# Patient Record
Sex: Female | Born: 1955
Health system: Southern US, Community
[De-identification: ages and names within clinical notes are randomized; demographics above are authoritative.]

## PROBLEM LIST (undated history)

## (undated) DIAGNOSIS — R0600 Dyspnea, unspecified: Secondary | ICD-10-CM

## (undated) DIAGNOSIS — E119 Type 2 diabetes mellitus without complications: Secondary | ICD-10-CM

## (undated) DIAGNOSIS — F419 Anxiety disorder, unspecified: Secondary | ICD-10-CM

## (undated) DIAGNOSIS — I509 Heart failure, unspecified: Secondary | ICD-10-CM

## (undated) DIAGNOSIS — Z87442 Personal history of urinary calculi: Secondary | ICD-10-CM

## (undated) DIAGNOSIS — G709 Myoneural disorder, unspecified: Secondary | ICD-10-CM

## (undated) DIAGNOSIS — Z9981 Dependence on supplemental oxygen: Secondary | ICD-10-CM

## (undated) DIAGNOSIS — F329 Major depressive disorder, single episode, unspecified: Secondary | ICD-10-CM

## (undated) DIAGNOSIS — F32A Depression, unspecified: Secondary | ICD-10-CM

## (undated) DIAGNOSIS — E785 Hyperlipidemia, unspecified: Secondary | ICD-10-CM

## (undated) DIAGNOSIS — I10 Essential (primary) hypertension: Secondary | ICD-10-CM

## (undated) DIAGNOSIS — J398 Other specified diseases of upper respiratory tract: Secondary | ICD-10-CM

## (undated) DIAGNOSIS — R87629 Unspecified abnormal cytological findings in specimens from vagina: Secondary | ICD-10-CM

## (undated) DIAGNOSIS — J189 Pneumonia, unspecified organism: Secondary | ICD-10-CM

## (undated) DIAGNOSIS — F319 Bipolar disorder, unspecified: Secondary | ICD-10-CM

## (undated) DIAGNOSIS — K219 Gastro-esophageal reflux disease without esophagitis: Secondary | ICD-10-CM

## (undated) DIAGNOSIS — J449 Chronic obstructive pulmonary disease, unspecified: Secondary | ICD-10-CM

## (undated) DIAGNOSIS — M1990 Unspecified osteoarthritis, unspecified site: Secondary | ICD-10-CM

## (undated) DIAGNOSIS — Z8719 Personal history of other diseases of the digestive system: Secondary | ICD-10-CM

## (undated) DIAGNOSIS — D649 Anemia, unspecified: Secondary | ICD-10-CM

## (undated) DIAGNOSIS — R519 Headache, unspecified: Secondary | ICD-10-CM

## (undated) DIAGNOSIS — J439 Emphysema, unspecified: Secondary | ICD-10-CM

## (undated) HISTORY — DX: Hyperlipidemia, unspecified: E78.5

## (undated) HISTORY — DX: Unspecified abnormal cytological findings in specimens from vagina: R87.629

## (undated) HISTORY — PX: KIDNEY STONE SURGERY: SHX686

## (undated) HISTORY — DX: Emphysema, unspecified: J43.9

## (undated) HISTORY — PX: HERNIA REPAIR: SHX51

## (undated) HISTORY — DX: Pneumonia, unspecified organism: J18.9

## (undated) HISTORY — PX: CHOLECYSTECTOMY: SHX55

---

## 2000-10-11 ENCOUNTER — Ambulatory Visit (HOSPITAL_COMMUNITY): Admission: RE | Admit: 2000-10-11 | Discharge: 2000-10-11 | Payer: Self-pay | Admitting: Orthopaedic Surgery

## 2000-10-11 ENCOUNTER — Encounter: Payer: Self-pay | Admitting: Orthopaedic Surgery

## 2000-11-16 ENCOUNTER — Emergency Department (HOSPITAL_COMMUNITY): Admission: EM | Admit: 2000-11-16 | Discharge: 2000-11-17 | Payer: Self-pay | Admitting: *Deleted

## 2002-02-24 ENCOUNTER — Inpatient Hospital Stay (HOSPITAL_COMMUNITY): Admission: EM | Admit: 2002-02-24 | Discharge: 2002-02-27 | Payer: Self-pay | Admitting: Psychiatry

## 2002-05-24 ENCOUNTER — Encounter: Payer: Self-pay | Admitting: Family Medicine

## 2002-05-24 ENCOUNTER — Ambulatory Visit (HOSPITAL_COMMUNITY): Admission: RE | Admit: 2002-05-24 | Discharge: 2002-05-24 | Payer: Self-pay | Admitting: Family Medicine

## 2002-05-29 ENCOUNTER — Encounter: Payer: Self-pay | Admitting: Family Medicine

## 2002-05-29 ENCOUNTER — Inpatient Hospital Stay (HOSPITAL_COMMUNITY): Admission: AD | Admit: 2002-05-29 | Discharge: 2002-05-31 | Payer: Self-pay | Admitting: Family Medicine

## 2002-05-29 ENCOUNTER — Ambulatory Visit (HOSPITAL_COMMUNITY): Admission: RE | Admit: 2002-05-29 | Discharge: 2002-05-29 | Payer: Self-pay | Admitting: Family Medicine

## 2003-09-11 ENCOUNTER — Emergency Department (HOSPITAL_COMMUNITY): Admission: EM | Admit: 2003-09-11 | Discharge: 2003-09-12 | Payer: Self-pay | Admitting: Emergency Medicine

## 2004-02-08 ENCOUNTER — Emergency Department (HOSPITAL_COMMUNITY): Admission: EM | Admit: 2004-02-08 | Discharge: 2004-02-08 | Payer: Self-pay | Admitting: Emergency Medicine

## 2004-05-29 ENCOUNTER — Ambulatory Visit (HOSPITAL_COMMUNITY): Admission: RE | Admit: 2004-05-29 | Discharge: 2004-05-29 | Payer: Self-pay | Admitting: Family Medicine

## 2004-06-23 ENCOUNTER — Ambulatory Visit (HOSPITAL_COMMUNITY): Admission: RE | Admit: 2004-06-23 | Discharge: 2004-06-23 | Payer: Self-pay | Admitting: Family Medicine

## 2005-03-02 ENCOUNTER — Ambulatory Visit (HOSPITAL_COMMUNITY): Admission: RE | Admit: 2005-03-02 | Discharge: 2005-03-02 | Payer: Self-pay | Admitting: Family Medicine

## 2005-05-14 ENCOUNTER — Ambulatory Visit (HOSPITAL_COMMUNITY): Admission: RE | Admit: 2005-05-14 | Discharge: 2005-05-14 | Payer: Self-pay | Admitting: Family Medicine

## 2005-07-23 ENCOUNTER — Ambulatory Visit (HOSPITAL_COMMUNITY): Admission: RE | Admit: 2005-07-23 | Discharge: 2005-07-23 | Payer: Self-pay | Admitting: Family Medicine

## 2005-11-19 ENCOUNTER — Emergency Department (HOSPITAL_COMMUNITY): Admission: EM | Admit: 2005-11-19 | Discharge: 2005-11-19 | Payer: Self-pay | Admitting: Emergency Medicine

## 2006-04-27 ENCOUNTER — Ambulatory Visit (HOSPITAL_COMMUNITY): Admission: RE | Admit: 2006-04-27 | Discharge: 2006-04-27 | Payer: Self-pay | Admitting: General Surgery

## 2006-07-12 ENCOUNTER — Ambulatory Visit (HOSPITAL_COMMUNITY): Admission: RE | Admit: 2006-07-12 | Discharge: 2006-07-12 | Payer: Self-pay | Admitting: *Deleted

## 2006-07-14 ENCOUNTER — Ambulatory Visit (HOSPITAL_COMMUNITY): Admission: RE | Admit: 2006-07-14 | Discharge: 2006-07-14 | Payer: Self-pay | Admitting: Cardiology

## 2006-12-30 ENCOUNTER — Ambulatory Visit (HOSPITAL_COMMUNITY): Admission: RE | Admit: 2006-12-30 | Discharge: 2006-12-30 | Payer: Self-pay | Admitting: Family Medicine

## 2007-03-30 ENCOUNTER — Ambulatory Visit (HOSPITAL_COMMUNITY): Admission: RE | Admit: 2007-03-30 | Discharge: 2007-03-30 | Payer: Self-pay | Admitting: Family Medicine

## 2008-01-27 ENCOUNTER — Emergency Department (HOSPITAL_COMMUNITY): Admission: EM | Admit: 2008-01-27 | Discharge: 2008-01-27 | Payer: Self-pay | Admitting: Emergency Medicine

## 2008-12-04 ENCOUNTER — Ambulatory Visit (HOSPITAL_COMMUNITY): Admission: RE | Admit: 2008-12-04 | Discharge: 2008-12-04 | Payer: Self-pay | Admitting: Family Medicine

## 2009-01-02 HISTORY — PX: ESOPHAGOGASTRODUODENOSCOPY: SHX1529

## 2009-01-02 HISTORY — PX: COLONOSCOPY: SHX174

## 2009-01-22 ENCOUNTER — Ambulatory Visit (HOSPITAL_COMMUNITY): Admission: RE | Admit: 2009-01-22 | Discharge: 2009-01-22 | Payer: Self-pay | Admitting: General Surgery

## 2009-04-27 ENCOUNTER — Emergency Department (HOSPITAL_COMMUNITY): Admission: EM | Admit: 2009-04-27 | Discharge: 2009-04-27 | Payer: Self-pay | Admitting: Emergency Medicine

## 2010-02-04 ENCOUNTER — Other Ambulatory Visit: Payer: Self-pay | Admitting: Emergency Medicine

## 2010-02-04 ENCOUNTER — Inpatient Hospital Stay (HOSPITAL_COMMUNITY): Admission: AD | Admit: 2010-02-04 | Discharge: 2010-02-10 | Payer: Self-pay | Admitting: Psychiatry

## 2010-02-04 ENCOUNTER — Ambulatory Visit: Payer: Self-pay | Admitting: Psychiatry

## 2010-03-19 ENCOUNTER — Ambulatory Visit (HOSPITAL_COMMUNITY): Admission: RE | Admit: 2010-03-19 | Discharge: 2010-03-19 | Payer: Self-pay | Admitting: Internal Medicine

## 2010-07-25 ENCOUNTER — Encounter: Payer: Self-pay | Admitting: Family Medicine

## 2010-07-26 ENCOUNTER — Encounter: Payer: Self-pay | Admitting: Family Medicine

## 2010-09-13 ENCOUNTER — Emergency Department (HOSPITAL_COMMUNITY)
Admission: EM | Admit: 2010-09-13 | Discharge: 2010-09-13 | Disposition: A | Discharge status: Home / Self Care | Payer: Medicaid Other | Attending: Emergency Medicine | Admitting: Emergency Medicine

## 2010-09-13 ENCOUNTER — Emergency Department (HOSPITAL_COMMUNITY): Payer: Medicaid Other

## 2010-09-13 DIAGNOSIS — H53149 Visual discomfort, unspecified: Secondary | ICD-10-CM | POA: Insufficient documentation

## 2010-09-13 DIAGNOSIS — G43909 Migraine, unspecified, not intractable, without status migrainosus: Secondary | ICD-10-CM | POA: Insufficient documentation

## 2010-09-13 DIAGNOSIS — R110 Nausea: Secondary | ICD-10-CM | POA: Insufficient documentation

## 2010-09-13 LAB — URINE MICROSCOPIC-ADD ON

## 2010-09-13 LAB — URINALYSIS, ROUTINE W REFLEX MICROSCOPIC
Bilirubin Urine: NEGATIVE
Glucose, UA: NEGATIVE mg/dL
Nitrite: NEGATIVE
Protein, ur: NEGATIVE mg/dL
Specific Gravity, Urine: 1.01 (ref 1.005–1.030)
Urobilinogen, UA: 0.2 mg/dL (ref 0.0–1.0)
pH: 5 (ref 5.0–8.0)

## 2010-09-17 LAB — BLOOD GAS, ARTERIAL
Acid-base deficit: 0 mmol/L (ref 0.0–2.0)
Bicarbonate: 24 mEq/L (ref 20.0–24.0)
O2 Saturation: 94.6 %
Patient temperature: 37
TCO2: 20.6 mmol/L (ref 0–100)
pCO2 arterial: 38 mmHg (ref 35.0–45.0)
pH, Arterial: 7.416 — ABNORMAL HIGH (ref 7.350–7.400)
pO2, Arterial: 70.5 mmHg — ABNORMAL LOW (ref 80.0–100.0)

## 2010-09-18 LAB — CBC
HCT: 41.8 % (ref 36.0–46.0)
Hemoglobin: 14.6 g/dL (ref 12.0–15.0)
MCH: 32.5 pg (ref 26.0–34.0)
MCHC: 34.8 g/dL (ref 30.0–36.0)
MCV: 93.4 fL (ref 78.0–100.0)
Platelets: 191 10*3/uL (ref 150–400)
RBC: 4.47 MIL/uL (ref 3.87–5.11)
RDW: 14.4 % (ref 11.5–15.5)
WBC: 7.2 10*3/uL (ref 4.0–10.5)

## 2010-09-18 LAB — LIPID PANEL
Cholesterol: 157 mg/dL (ref 0–200)
HDL: 35 mg/dL — ABNORMAL LOW (ref >39)
LDL Cholesterol: 91 mg/dL (ref 0–99)
Total CHOL/HDL Ratio: 4.5 RATIO
Triglycerides: 153 mg/dL — ABNORMAL HIGH (ref <150)
VLDL: 31 mg/dL (ref 0–40)

## 2010-09-18 LAB — RAPID URINE DRUG SCREEN, HOSP PERFORMED
Amphetamines: NOT DETECTED
Barbiturates: NOT DETECTED
Benzodiazepines: POSITIVE — AB
Cocaine: POSITIVE — AB
Opiates: NOT DETECTED
Tetrahydrocannabinol: NOT DETECTED

## 2010-09-18 LAB — BASIC METABOLIC PANEL
BUN: 4 mg/dL — ABNORMAL LOW (ref 6–23)
CO2: 25 mEq/L (ref 19–32)
Calcium: 9.7 mg/dL (ref 8.4–10.5)
Chloride: 104 mEq/L (ref 96–112)
Creatinine, Ser: 0.79 mg/dL (ref 0.4–1.2)
GFR calc Af Amer: >60 mL/min (ref >60)
GFR calc non Af Amer: >60 mL/min (ref >60)
Glucose, Bld: 109 mg/dL — ABNORMAL HIGH (ref 70–99)
Potassium: 3.7 mEq/L (ref 3.5–5.1)
Sodium: 136 mEq/L (ref 135–145)

## 2010-09-18 LAB — URINALYSIS, ROUTINE W REFLEX MICROSCOPIC
Bilirubin Urine: NEGATIVE
Glucose, UA: NEGATIVE mg/dL
Hgb urine dipstick: NEGATIVE
Ketones, ur: NEGATIVE mg/dL
Nitrite: NEGATIVE
Protein, ur: NEGATIVE mg/dL
Specific Gravity, Urine: 1.013 (ref 1.005–1.030)
Urobilinogen, UA: 0.2 mg/dL (ref 0.0–1.0)
pH: 6 (ref 5.0–8.0)

## 2010-09-18 LAB — HEPATIC FUNCTION PANEL
ALT: 19 U/L (ref 0–35)
AST: 24 U/L (ref 0–37)
Albumin: 3.2 g/dL — ABNORMAL LOW (ref 3.5–5.2)
Alkaline Phosphatase: 100 U/L (ref 39–117)
Bilirubin, Direct: 0.1 mg/dL (ref 0.0–0.3)
Indirect Bilirubin: 0.6 mg/dL (ref 0.3–0.9)
Total Bilirubin: 0.7 mg/dL (ref 0.3–1.2)
Total Protein: 6.3 g/dL (ref 6.0–8.3)

## 2010-09-18 LAB — DIFFERENTIAL
Basophils Absolute: 0.1 10*3/uL (ref 0.0–0.1)
Basophils Relative: 1 % (ref 0–1)
Eosinophils Absolute: 0.3 10*3/uL (ref 0.0–0.7)
Eosinophils Relative: 4 % (ref 0–5)
Lymphocytes Relative: 29 % (ref 12–46)
Lymphs Abs: 2.1 10*3/uL (ref 0.7–4.0)
Monocytes Absolute: 0.7 10*3/uL (ref 0.1–1.0)
Monocytes Relative: 10 % (ref 3–12)
Neutro Abs: 4 10*3/uL (ref 1.7–7.7)
Neutrophils Relative %: 56 % (ref 43–77)

## 2010-09-18 LAB — TSH: TSH: 0.754 u[IU]/mL (ref 0.350–4.500)

## 2010-09-18 LAB — ETHANOL: Alcohol, Ethyl (B): <5 mg/dL (ref 0–10)

## 2010-10-11 LAB — CLOTEST (H. PYLORI), BIOPSY: Helicobacter screen: NEGATIVE

## 2010-10-12 LAB — BLOOD GAS, ARTERIAL
Acid-Base Excess: 0.5 mmol/L (ref 0.0–2.0)
Bicarbonate: 24.2 mEq/L — ABNORMAL HIGH (ref 20.0–24.0)
FIO2: 21 %
O2 Saturation: 93.9 %
Patient temperature: 37
TCO2: 21 mmol/L (ref 0–100)
pCO2 arterial: 36.4 mmHg (ref 35.0–45.0)
pH, Arterial: 7.438 — ABNORMAL HIGH (ref 7.350–7.400)
pO2, Arterial: 65 mmHg — ABNORMAL LOW (ref 80.0–100.0)

## 2010-11-07 ENCOUNTER — Emergency Department (HOSPITAL_COMMUNITY)
Admission: EM | Admit: 2010-11-07 | Discharge: 2010-11-07 | Disposition: A | Discharge status: Home / Self Care | Payer: Medicaid Other | Attending: Emergency Medicine | Admitting: Emergency Medicine

## 2010-11-07 ENCOUNTER — Emergency Department (HOSPITAL_COMMUNITY): Payer: Medicaid Other

## 2010-11-07 DIAGNOSIS — Y998 Other external cause status: Secondary | ICD-10-CM | POA: Insufficient documentation

## 2010-11-07 DIAGNOSIS — S41109A Unspecified open wound of unspecified upper arm, initial encounter: Secondary | ICD-10-CM | POA: Insufficient documentation

## 2010-11-07 DIAGNOSIS — W268XXA Contact with other sharp object(s), not elsewhere classified, initial encounter: Secondary | ICD-10-CM | POA: Insufficient documentation

## 2010-11-07 DIAGNOSIS — Z1810 Retained metal fragments, unspecified: Secondary | ICD-10-CM | POA: Insufficient documentation

## 2010-11-17 NOTE — Procedures (Signed)
NAME:  [name], [name]         ACCOUNT NO.:  [id number]   MEDICAL RECORD NO.:  [id number]          PATIENT TYPE:  OUT   LOCATION:  RESP                          FACILITY:  [facility]   PHYSICIAN:  Edward L. Juanetta Gosling, M.D.DATE OF BIRTH:  1955-08-05   DATE OF PROCEDURE:  12/30/2006  DATE OF DISCHARGE:  12/30/2006                            PULMONARY FUNCTION TEST   1. Spirometry shows a mild ventilatory defect with evidence of airflow      obstruction, most marked in the smaller airways.  2. Lung volumes show minimal restrictive change with TLC 79% of      predicted with 80% being the lower limit of normal.  3. DLCO is normal.  4. Blood gases are normal.  5. There is no significant bronchodilator effect.      Edward L. Juanetta Gosling, M.D.  Electronically Signed     ELH/MEDQ  D:  01/02/2007  T:  01/03/2007  Job:  [id number]   cc:   Patrica Duel, M.D.  Fax: [phone]

## 2010-11-20 NOTE — Procedures (Signed)
NAME:  [name], [name]         ACCOUNT NO.:  [id number]   MEDICAL RECORD NO.:  [id number]          PATIENT TYPE:  OUT   LOCATION:  RESP                          FACILITY:  [facility]   PHYSICIAN:  Edward L. Juanetta Gosling, M.D.DATE OF BIRTH:  03-Oct-1955   DATE OF PROCEDURE:  DATE OF DISCHARGE:  12/04/2008                            PULMONARY FUNCTION TEST   1. Spirometry shows a moderate ventilatory defect with airflow      obstruction most marked in the smaller airways.  2. Lung volume show reduction in total lung capacity suggesting      restrictive pulmonary disease.  3. DLCO is normal.  4. Arterial blood gas shows relative resting hypoxemia.  5. There is significant improvement with inhaled bronchodilator.  6. This study is consistent with a diagnosis of COPD and asthma, but      probably with a separate restrictive change which may be based on      body habitus.      Edward L. Juanetta Gosling, M.D.  Electronically Signed     ELH/MEDQ  D:  12/06/2008  T:  12/06/2008  Job:  [id number]   cc:   Patrica Duel, M.D.  Fax: [phone]

## 2010-11-20 NOTE — H&P (Signed)
NAME:  [name], [name]         ACCOUNT NO.:  [id number]   MEDICAL RECORD NO.:  [id number]          PATIENT TYPE:  AMB   LOCATION:  DAY                           FACILITY:  [facility]   PHYSICIAN:  Dalia Heading, M.D.  DATE OF BIRTH:  [birth year]   DATE OF ADMISSION:  DATE OF DISCHARGE:  LH                              HISTORY & PHYSICAL   CHIEF COMPLAINT:  Need for screening colonoscopy, intractable GERD.   HISTORY OF PRESENT ILLNESS:  The patient is a 55 year old white female  who is referred for endoscopic evaluation.  She needs a colonoscopy for  screening purposes and EGD for GERD.  She has been having GERD for many  years, but recently has not been controlled well with a PPI.  No weight  loss, nausea, vomiting, diarrhea, constipation, melena, hematochezia  have been noted.  She has never had a colonoscopy.  There is no family  history of colon carcinoma.   PAST MEDICAL HISTORY:  Alcohol use, anxiety, bipolar disorder, and COPD.   PAST SURGICAL HISTORY:  Laparoscopic cholecystectomy and incisional  herniorrhaphy.   CURRENT MEDICATIONS:  Xanax, Lorcet, Prevacid, Combivent inhaler, and  blood pressure medication.   ALLERGIES:  SEPTRA.   REVIEW OF SYSTEMS:  The patient smokes half-pack cigarettes a day.  She  states that she has abstained from alcohol use.  Her COPD is well  controlled recently.   PHYSICAL EXAMINATION:  GENERAL:  The patient is a well-developed, well-  nourished white female in no acute distress.  LUNGS:  Clear to auscultation with equal breath sounds bilaterally.  HEART:  Regular rate and rhythm without S3, S4, or murmurs.  ABDOMEN:  Soft, nontender, and nondistended.  No hepatosplenomegaly or  masses noted.  RECTAL:  Deferred to the procedure.   IMPRESSION:  Intractable gastroesophageal reflux disease, need for  screening colonoscopy.   PLAN:  The patient is scheduled for an EGD and colonoscopy on December 31, 2008.  Risks and benefits of the procedure  including bleeding and  perforation were fully explained to the patient, gave informed consent.      Dalia Heading, M.D.  Electronically Signed     MAJ/MEDQ  D:  12/24/2008  T:  12/25/2008  Job:  [id number]   cc:   [facility], M.D.  Fax: [phone]

## 2010-11-20 NOTE — Op Note (Signed)
NAME:  [name], [name]         ACCOUNT NO.:  [id number]   MEDICAL RECORD NO.:  [id number]          PATIENT TYPE:  AMB   LOCATION:  DAY                           FACILITY:  [facility]   PHYSICIAN:  Dalia Heading, M.D.  DATE OF BIRTH:  1955-10-20   DATE OF PROCEDURE:  04/27/2006  DATE OF DISCHARGE:                                 OPERATIVE REPORT   PREOPERATIVE DIAGNOSIS:  Incisional hernia.   POSTOPERATIVE DIAGNOSIS:  Incisional hernia.   PROCEDURE:  Incisional herniorrhaphy with mesh.   SURGEON:  Dalia Heading, M.D.   ANESTHESIA:  General endotracheal.   INDICATIONS:  The patient is a 54 year old white female status post  laparoscopic cholecystectomy in the remote past who now presents with an  epigastric incisional hernia.  The risks and benefits of the procedure  including bleeding, infection, recurrence of the hernia were fully explained  to the patient, who gave informed consent.   PROCEDURE NOTE:  The patient was placed in supine position.  After induction  of general endotracheal anesthesia, the abdomen was prepped and draped in  the usual sterile technique with Betadine.  Surgical site confirmation was  performed.   A transverse incision was made.  The epigastric region through the previous  epigastric surgical scar site.  This was taken down to the fascia.  A 2 cm  defect was noted within the fascia with adipose tissue extending out through  the hernia.  This was excised and reduced.  A medium-size polypropylene mesh  plug was then placed into this region and secured circumferentially to the  fascia using 2-0 Novofil interrupted sutures.  The subcutaneous layer was  reapproximated using 2-0 and 3-0 Vicryl interrupted sutures.  Skin was  closed using a 4-0 Vicryl subcuticular suture.  Dermabond was then applied.  We instilled 0.5% Sensorcaine into the surrounding wound.   All tape and needle counts were correct at the end of the procedure.  The  patient was  extubated in the operating room and went back to the recovery  room awake in stable condition.   COMPLICATIONS:  None.   SPECIMEN:  None.   BLOOD LOSS:  Minimal.      Dalia Heading, M.D.  Electronically Signed     MAJ/MEDQ  D:  04/27/2006  T:  04/28/2006  Job:  [id number]   cc:   Patrica Duel, M.D.  Fax: [phone]

## 2010-11-20 NOTE — Discharge Summary (Signed)
   NAME:  [name], [name]                   ACCOUNT NO.:  [id number]   MEDICAL RECORD NO.:  [id number]                   PATIENT TYPE:  INP   LOCATION:  A322                                 FACILITY:  [facility]   PHYSICIAN:  Patrica Duel, M.D.                 DATE OF BIRTH:  03-09-1956   DATE OF ADMISSION:  05/29/2002  DATE OF DISCHARGE:  05/31/2002                                 DISCHARGE SUMMARY   DISCHARGE DIAGNOSES:  1. Pneumonia, underlying chronic obstructive pulmonary disease and tobacco     abuse.  2. Significant psychiatric history with depression, suicide attempts, and     probable bipolar symptoms.  Well-controlled.   HISTORY OF PRESENT ILLNESS:  For details regarding admission, please refer  to the admitting note.  Briefly, this is a 55 year old female with the above  history who presented to the office four days prior to admission and was  given prednisone, Zithromax, and Endal.  She continued to experience  increasingly severe cough and wheezing.  She came back to the office where  an x-ray revealed probable right middle lobe pneumonia and possible left  lower lobe infiltrates.  She was admitted for definitive therapy of probable  pneumonia/exacerbation of COPD.   HOSPITAL COURSE:  The patient was treated routinely with IV steroids,  nebulizers, and broad-spectrum antibiotics.  She responded promptly and was  safe for discharge on the second hospital day.   DISCHARGE MEDICATIONS:  1. Theo-Dur 300 mg b.i.d.  2. Combivent two puffs q.i.d. p.r.n.  3. Levaquin 500 mg daily.  4. Prednisone 10 mg q.i.d., tapering.  5. Protonix 40 mg b.i.d.  6. She is to continue her psychiatric medications as previously prescribed.   FOLLOW UP:  She will be followed as an outpatient.                                               Patrica Duel, M.D.    MC/MEDQ  D:  09/02/2002  T:  09/02/2002  Job:  [id number]

## 2010-11-20 NOTE — Cardiovascular Report (Signed)
NAME:  [name], [name]         ACCOUNT NO.:  [id number]   MEDICAL RECORD NO.:  [id number]          PATIENT TYPE:  OIB   LOCATION:  2899                         FACILITY:  [facility]   PHYSICIAN:  Cristy Hilts. Jacinto Halim, MD       DATE OF BIRTH:  09/28/1955   DATE OF PROCEDURE:  07/14/2006  DATE OF DISCHARGE:  07/14/2006                            CARDIAC CATHETERIZATION   PROCEDURE PERFORMED:  1. Left ventriculography.  2. Selective right and left coronary arteriography.  3. Abdominal aortogram.  4. Right femoral angiography and closure of the right femoral arterial      access with StarClose.   INDICATION:  [name] is a 55 year old female with a  history of COPD, restless legs syndrome and bipolar disorder who has  been having recurrent chest discomfort and previously shortness of  breath and dyspnea on exertion and also complains of palpation.  She had  undergone a stress MIBI, which was normal; however, because of her  continued chest discomfort, she was brought to the cardiac  catheterization lab to reevaluate her coronary anatomy for additional  diagnosis of coronary disease.  Abdominal aortogram was performed to  evaluate for abdominal atherosclerosis and to evaluate for iliac  disease, given symptoms suggestive of claudication.   HEMODYNAMIC DATA:  The left ventricular pressure was 133/10 with an end-  diastolic pressure of 21 mmHg.  The aortic pressure of 129/74 with a  mean of 98 mmHg.  There was no pressure gradient across the aortic  valve.   ANGIOGRAPHIC DATA:  Left ventricle:  The left ventricular systolic  function was normal with an ejection fraction of 55%.  There was no  significant mitral dilatation.   Right coronary artery:  The right coronary artery is a large-caliber  vessel and a dominant vessel.  It has got diffuse, mild luminal  irregularity throughout.  The RCA gives origin to a moderate-sized PDA  and moderate-sized PLA branches.   Left main:   The left main, again, has got mild luminal irregularity.   Left anterior descending:  The LAD is a large-caliber vessel with a  proximal segment.  It has got diffuse, mild luminal irregularity.  There  is no significant coronary disease.  The LAD gives origin to several  small diagonals.  The LAD wraps around the apex.   The ramus intermedius:  The ramus intermedius is a very small vessel.   Circumflex:  The circumflex is a moderate- to large-caliber vessel with  a proximal segment, gives origin to a large obtuse marginal 1.  The  circumflex has mild luminal irregularity, diffusely.   Abdominal aortogram:  Abdominal aortogram reveals the presence of 2  renal arteries, 1 on either side; they are widely patent.  The aorto-  iliac bifurcation was widely patent.  The iliac artery showed mild  luminal irregularity.   IMPRESSION:  1. Mild, diffuse luminal irregularity, constituting 10% to 20%      stenosis of the coronary arteries.  No significant coronary artery      disease.  2. Normal LV systolic function.   RECOMMENDATIONS:  Risk modification, smoking cessation, and lifestyle  modification is indicated.  The patient will be discharged home today.   A total of 95 mL of contrast was utilized for diagnostic angiography.   DESCRIPTION OF PROCEDURE:  Under the usual sterile precautions using a 6-  French right femoral artery access, a 6-French multipurpose B2 catheter  was advanced into the ascending aorta with a 0.035-inch J wire. The  catheter was gently advanced into the left ventricle and left  ventricular pressures were monitored. Hand contrast injection of the  left ventricle was performed both in the RAO and LAO projections. The  catheter was flushed with saline, pulled back into the ascending  Aorta, and pressure gradient across the aortic valve was monitored.  The  right coronary artery was selectively engaged and angiography was  performed. Then the left main coronary artery  was selectively engaged  and angiography was performed. Then the catheter was pulled back into  the abdominal aorta, and abdominal aortogram was performed.  Then the  catheter was pulled out of the body in the usual fashion.  Right femoral  angiography was performed through the arterial access sheath, and the  access was closed with StarClose with excellent hemostasis.  The patient  tolerated the procedure.  No immediate complications noted.      Cristy Hilts. Jacinto Halim, MD  Electronically Signed     JRG/MEDQ  D:  07/14/2006  T:  07/14/2006  Job:  [id number]   cc:   Rebekah Gobble, MD  [name], M.D.

## 2010-11-20 NOTE — H&P (Signed)
NAME:  [name], [name]         ACCOUNT NO.:  [id number]   MEDICAL RECORD NO.:  [id number]          PATIENT TYPE:  AMB   LOCATION:  DAY                           FACILITY:  [facility]   PHYSICIAN:  Dalia Heading, M.D.  DATE OF BIRTH:  12/12/1955   DATE OF ADMISSION:  04/26/2006  DATE OF DISCHARGE:  LH                                HISTORY & PHYSICAL   CHIEF COMPLAINT:  Incisional hernia.   HISTORY OF PRESENT ILLNESS:  The patient is a 55 year old white female who  is referred for evaluation/treatment of a ventral hernia.  She started  having swelling in the epigastric region over the past year and is  enlarging.  Intermittent tenderness is noted.   PAST MEDICAL HISTORY:  1. Bipolar disorder.  2. COPD.   PAST SURGICAL HISTORY:  Laparoscopic cholecystectomy in 2000.   CURRENT MEDICATIONS:  1. Prozac.  2. Xanax.  3. Lortab.  4. Prilosec.  5. Combivent.  6. Advair.  7. Requip.  8. Ibuprofen.   ALLERGIES:  SEPTRA.   REVIEW OF SYSTEMS:  The patient smokes half pack cigarettes a day.  She  denies any significant alcohol use.  Her COPD has been well-controlled  recently.   PHYSICAL EXAMINATION:  GENERAL APPEARANCE:  The patient is a well-developed,  well-nourished white female in no acute distress.  LUNGS:  Clear to auscultation with equal breath sounds bilaterally.  HEART:  Regular rate and rhythm without history, S4, murmurs.  ABDOMEN:  Soft and nondistended.  She is tender in the epigastric region  underneath the surgical incision site.  A reducible have a hernia is  present.  No hepatosplenomegaly or masses noted.   IMPRESSION:  Incisional hernia.   PLAN:  The patient is scheduled for an incisional herniorrhaphy with mesh on  April 27, 2006.  Risks and benefits of the procedure including bleeding,  infection, and recurrence of the hernia were fully explained to the patient,  gave informed consent.      Dalia Heading, M.D.  Electronically Signed     MAJ/MEDQ  D:  04/26/2006  T:  04/26/2006  Job:  [id number]   cc:   Short Stay at [facility]   Patrica Duel, M.D.  Fax: [phone]
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
## 2010-11-20 NOTE — H&P (Signed)
NAME:  [name], [name]                   ACCOUNT NO.:  [id number]   MEDICAL RECORD NO.:  [id number]                   PATIENT TYPE:   LOCATION:  322                                  FACILITY:  [facility]   PHYSICIAN:  Patrica Duel, M.D.                 DATE OF BIRTH:  1956-06-21   DATE OF ADMISSION:  05/29/2002  DATE OF DISCHARGE:                                HISTORY & PHYSICAL   CHIEF COMPLAINT:  Cough, shortness of breath.   HISTORY OF PRESENT ILLNESS:  This is a 55 year old female with a history of  COPD/asthmatic bronchitis.  She has been admitted several times in the past  for same.  She also has significant psychiatric history with depression,  suicide attempts.  She has been very well controlled in this matter for  several years.   The patient presented to the office with persistent cough, fever, and  shortness of breath today.  She was seen by Dr. Regino Schultze four days ago.  She  was given prednisone, Zithromax, and Intal.  She has continued experiencing  increasingly severe cough, wheezing.  This has been moderately productive of  purulent mucus.  An x-ray in the office revealed probable right middle lobe  and possible left lower lobe infiltrates.  Physical examination confirms  diffuse rhonchi and wheezes.  O2 saturation 93% with a heart rate of 94.   The patient denies nausea, vomiting, diarrhea, melena, hematemesis,  hematochezia.  She also denies chest pain, shortness of breath, syncope,  headache, or other symptoms.   CURRENT MEDICATIONS:  1. Home medications with Atrovent and Ventolin.  2. Xanax 1 mg b.i.d.  3. Lorcet p.r.n.  4. Skelaxin p.r.n.   PAST MEDICAL HISTORY:  Noted above.  She is status post gallbladder surgery  as well as nephrolithiasis and tubal ligation.   FAMILY HISTORY:  Noncontributory.   SOCIAL HISTORY:  The patient used to smoke approximately two packs of  cigarettes per day.  She denies use of alcohol or drugs.   PHYSICAL EXAMINATION:   GENERAL:  Very pleasant female in no acute distress.  She is conversant.  VITAL SIGNS:  Temperature 99.8, pulse 94 and regular, respirations 18 and  unlabored, blood pressure 120/70.  HEENT:  Normocephalic, atraumatic.  Pupils are equal.  Ears, nose, throat  benign.  NECK:  Supple without bruits, thyromegaly, lymphadenopathy, or JVD.  LUNGS:  Diffuse expiratory wheezes and rhonchi, particularly on the right.  HEART:  Sounds are distant.  No murmurs, rubs, or gallops.  ABDOMEN:  Nontender, nondistended.  Bowel sounds are intact.  EXTREMITIES:  No clubbing, cyanosis, edema.  NEUROLOGIC:  Without focal deficits.   ASSESSMENT:  Pneumonia despite macrolide therapy and aggressive outpatient  therapy.   PLAN:  Admit for pulmonary toilet, IV antibiotics, IV steroids, and further  evaluation and therapy as indicated.  Will follow and treat expectantly.  Patrica Duel, M.D.    MC/MEDQ  D:  05/29/2002  T:  05/29/2002  Job:  [id number]

## 2010-12-14 ENCOUNTER — Other Ambulatory Visit (HOSPITAL_COMMUNITY): Payer: Self-pay | Admitting: Internal Medicine

## 2010-12-14 DIAGNOSIS — F411 Generalized anxiety disorder: Secondary | ICD-10-CM

## 2010-12-14 DIAGNOSIS — Z139 Encounter for screening, unspecified: Secondary | ICD-10-CM

## 2010-12-14 DIAGNOSIS — G8929 Other chronic pain: Secondary | ICD-10-CM

## 2010-12-17 ENCOUNTER — Ambulatory Visit (HOSPITAL_COMMUNITY): Payer: Self-pay

## 2010-12-18 ENCOUNTER — Ambulatory Visit (HOSPITAL_COMMUNITY): Payer: Medicaid Other

## 2011-03-09 ENCOUNTER — Other Ambulatory Visit (HOSPITAL_COMMUNITY): Payer: Self-pay | Admitting: Internal Medicine

## 2011-03-09 DIAGNOSIS — Z139 Encounter for screening, unspecified: Secondary | ICD-10-CM

## 2011-03-16 ENCOUNTER — Ambulatory Visit (HOSPITAL_COMMUNITY): Payer: Medicaid Other

## 2011-03-18 ENCOUNTER — Ambulatory Visit (HOSPITAL_COMMUNITY): Admission: RE | Admit: 2011-03-18 | Payer: Medicaid Other | Source: Ambulatory Visit

## 2011-04-02 LAB — URINALYSIS, ROUTINE W REFLEX MICROSCOPIC
Glucose, UA: NEGATIVE
Ketones, ur: NEGATIVE
Protein, ur: 100 — AB
Urobilinogen, UA: 2 — ABNORMAL HIGH

## 2011-04-02 LAB — URINE MICROSCOPIC-ADD ON

## 2011-04-02 LAB — POCT I-STAT, CHEM 8
BUN: 10
Calcium, Ion: 1.07 — ABNORMAL LOW
Hemoglobin: 16.3 — ABNORMAL HIGH
TCO2: 18

## 2011-04-21 LAB — BLOOD GAS, ARTERIAL
Acid-base deficit: 1.9
Bicarbonate: 22.1
O2 Saturation: 96.9
Patient temperature: 37
TCO2: 19.6
pH, Arterial: 7.412 — ABNORMAL HIGH

## 2011-09-03 ENCOUNTER — Ambulatory Visit (HOSPITAL_COMMUNITY): Payer: Medicaid Other

## 2011-09-06 ENCOUNTER — Ambulatory Visit (HOSPITAL_COMMUNITY): Payer: Medicaid Other

## 2012-02-07 ENCOUNTER — Emergency Department (HOSPITAL_COMMUNITY)
Admission: EM | Admit: 2012-02-07 | Discharge: 2012-02-07 | Disposition: A | Discharge status: Home / Self Care | Payer: Medicaid Other | Attending: Emergency Medicine | Admitting: Emergency Medicine

## 2012-02-07 ENCOUNTER — Emergency Department (HOSPITAL_COMMUNITY): Payer: Medicaid Other

## 2012-02-07 ENCOUNTER — Encounter (HOSPITAL_COMMUNITY): Payer: Self-pay | Admitting: *Deleted

## 2012-02-07 DIAGNOSIS — J449 Chronic obstructive pulmonary disease, unspecified: Secondary | ICD-10-CM | POA: Insufficient documentation

## 2012-02-07 DIAGNOSIS — J029 Acute pharyngitis, unspecified: Secondary | ICD-10-CM | POA: Insufficient documentation

## 2012-02-07 DIAGNOSIS — F17200 Nicotine dependence, unspecified, uncomplicated: Secondary | ICD-10-CM | POA: Insufficient documentation

## 2012-02-07 DIAGNOSIS — N390 Urinary tract infection, site not specified: Secondary | ICD-10-CM | POA: Insufficient documentation

## 2012-02-07 DIAGNOSIS — N83209 Unspecified ovarian cyst, unspecified side: Secondary | ICD-10-CM | POA: Insufficient documentation

## 2012-02-07 DIAGNOSIS — A599 Trichomoniasis, unspecified: Secondary | ICD-10-CM | POA: Insufficient documentation

## 2012-02-07 DIAGNOSIS — J4489 Other specified chronic obstructive pulmonary disease: Secondary | ICD-10-CM | POA: Insufficient documentation

## 2012-02-07 HISTORY — DX: Chronic obstructive pulmonary disease, unspecified: J44.9

## 2012-02-07 LAB — URINALYSIS, ROUTINE W REFLEX MICROSCOPIC
Glucose, UA: NEGATIVE mg/dL
Specific Gravity, Urine: >1.03 — ABNORMAL HIGH (ref 1.005–1.030)
Urobilinogen, UA: 0.2 mg/dL (ref 0.0–1.0)

## 2012-02-07 LAB — URINE MICROSCOPIC-ADD ON

## 2012-02-07 MED ORDER — CEPHALEXIN 250 MG PO CAPS: 250.0000 mg | ORAL_CAPSULE | Freq: Four times a day (QID) | ORAL | Status: AC

## 2012-02-07 MED ORDER — METRONIDAZOLE 500 MG PO TABS
2000.0000 mg | ORAL_TABLET | Freq: Once | ORAL | Status: AC
  Administered 2012-02-07: 2000 mg via ORAL
  Filled 2012-02-07: qty 4
  Filled 2012-02-07: qty 2

## 2012-02-07 MED ORDER — HYDROCODONE-ACETAMINOPHEN 5-325 MG PO TABS
1.0000 | ORAL_TABLET | Freq: Once | ORAL | Status: AC
  Administered 2012-02-07: 1 via ORAL
  Filled 2012-02-07: qty 1

## 2012-02-07 MED ORDER — OXYCODONE-ACETAMINOPHEN 5-325 MG PO TABS: 1.0000 | ORAL_TABLET | ORAL | Status: AC | PRN

## 2012-02-07 NOTE — ED Notes (Signed)
Sore throat with swollen lymph nodes per pt.  Low back pain

## 2012-02-09 LAB — URINE CULTURE: Culture: NO GROWTH

## 2012-02-10 NOTE — ED Provider Notes (Signed)
History     CSN: [id number]  Arrival date & time 02/07/12  1950   First MD Initiated Contact with Patient 02/07/12 2033      Chief Complaint  Patient presents with  . Sore Throat    (Consider location/radiation/quality/duration/timing/severity/associated sxs/prior treatment) HPI Comments: Patient c/o sore throat for several days.  Also states that her glands are swollen.  Throat pain is worse with swallowing.  She has not tried anything for the pain.  She also states that her lower back has been hurting for several days.  Pain to her back is worse with movement and she has also noticed a foul odor to her urine recently and states that it appears darker than usual.  She denies incontinence, pain or weakness of her LE's, numbness, vaginal bleeding or discharge.    Patient is a 56 y.o. female presenting with pharyngitis. The history is provided by the patient.  Sore Throat This is a new problem. The current episode started in the past 7 days. The problem occurs constantly. The problem has been unchanged. Associated symptoms include a sore throat, swollen glands and urinary symptoms. Pertinent negatives include no abdominal pain, chest pain, chills, congestion, coughing, fever, headaches, joint swelling, neck pain, numbness, rash, vomiting or weakness. Associated symptoms comments: Low back pain. The symptoms are aggravated by swallowing, bending and twisting. She has tried nothing for the symptoms. The treatment provided no relief.    Past Medical History  Diagnosis Date  . COPD (chronic obstructive pulmonary disease)     Past Surgical History  Procedure Date  . Kidney stone surgery   . Cholecystectomy   . Hernia repair     History reviewed. No pertinent family history.  History  Substance Use Topics  . Smoking status: Current Everyday Smoker  . Smokeless tobacco: Not on file  . Alcohol Use: No    OB History    Grav Para Term Preterm Abortions TAB SAB Ect Mult Living           Review of Systems  Constitutional: Negative for fever, chills and appetite change.  HENT: Positive for sore throat. Negative for congestion, trouble swallowing, neck pain, neck stiffness and voice change.   Respiratory: Negative for cough and shortness of breath.   Cardiovascular: Negative for chest pain.  Gastrointestinal: Negative for vomiting, abdominal pain and constipation.  Genitourinary: Positive for dysuria. Negative for hematuria, flank pain, decreased urine volume, vaginal bleeding, vaginal discharge, difficulty urinating, genital sores and vaginal pain.       Perineal numbness or incontinence of urine or feces  Musculoskeletal: Positive for back pain. Negative for joint swelling.  Skin: Negative for color change and rash.  Neurological: Negative for weakness, numbness and headaches.  All other systems reviewed and are negative.    Allergies  Penicillins and Septra  Home Medications   Current Outpatient Rx  Name Route Sig Dispense Refill  . IPRATROPIUM-ALBUTEROL 18-103 MCG/ACT IN AERO Inhalation Inhale 2 puffs into the lungs every 6 (six) hours as needed.    . ALPRAZOLAM 1 MG PO TABS Oral Take 1 mg by mouth 4 (four) times daily as needed.    Marland Kitchen FLUTICASONE-SALMETEROL 250-50 MCG/DOSE IN AEPB Inhalation Inhale 1 puff into the lungs every 12 (twelve) hours.    . CEPHALEXIN 250 MG PO CAPS Oral Take 1 capsule (250 mg total) by mouth 4 (four) times daily. For 7 days 28 capsule 0  . OXYCODONE-ACETAMINOPHEN 5-325 MG PO TABS Oral Take 1 tablet by mouth every 4 (  four) hours as needed for pain. 15 tablet 0    BP 119/73  Pulse 67  Temp 97.8 F (36.6 C) (Oral)  Resp 16  Ht 5\' 4"  (1.626 m)  Wt 188 lb (85.276 kg)  BMI 32.27 kg/m2  SpO2 98%  Physical Exam  Nursing note and vitals reviewed. Constitutional: She is oriented to person, place, and time. She appears well-developed and well-nourished. No distress.  HENT:  Head: Normocephalic and atraumatic. No trismus in the  jaw.  Right Ear: Tympanic membrane and ear canal normal.  Left Ear: Tympanic membrane and ear canal normal.  Mouth/Throat: Uvula is midline and mucous membranes are normal. No uvula swelling. Posterior oropharyngeal erythema present. No oropharyngeal exudate, posterior oropharyngeal edema or tonsillar abscesses.  Neck: Normal range of motion. Neck supple.  Cardiovascular: Normal rate, regular rhythm, normal heart sounds and intact distal pulses.   No murmur heard. Pulmonary/Chest: Effort normal and breath sounds normal.  Abdominal: Normal appearance. There is no hepatosplenomegaly. There is no tenderness. There is no rigidity, no guarding and no CVA tenderness.  Musculoskeletal: She exhibits tenderness. She exhibits no edema.       Lumbar back: She exhibits tenderness and pain. She exhibits normal range of motion, no swelling, no deformity, no laceration and normal pulse.       Back:  Lymphadenopathy:    She has no cervical adenopathy.  Neurological: She is alert and oriented to person, place, and time. No cranial nerve deficit or sensory deficit. She exhibits normal muscle tone. Coordination and gait normal.  Reflex Scores:      Patellar reflexes are 2+ on the right side and 2+ on the left side.      Achilles reflexes are 2+ on the right side and 2+ on the left side. Skin: Skin is warm and dry.    ED Course  Procedures (including critical care time)  Results for orders placed during the hospital encounter of 02/07/12  URINALYSIS, ROUTINE W REFLEX MICROSCOPIC      Component Value Range   Color, Urine AMBER (*) YELLOW   APPearance CLEAR  CLEAR   Specific Gravity, Urine >1.030 (*) 1.005 - 1.030   pH 6.0  5.0 - 8.0   Glucose, UA NEGATIVE  NEGATIVE mg/dL   Hgb urine dipstick SMALL (*) NEGATIVE   Bilirubin Urine NEGATIVE  NEGATIVE   Ketones, ur NEGATIVE  NEGATIVE mg/dL   Protein, ur NEGATIVE  NEGATIVE mg/dL   Urobilinogen, UA 0.2  0.0 - 1.0 mg/dL   Nitrite NEGATIVE  NEGATIVE    Leukocytes, UA TRACE (*) NEGATIVE  URINE MICROSCOPIC-ADD ON      Component Value Range   Squamous Epithelial / LPF FEW (*) RARE   WBC, UA 11-20  <3 WBC/hpf   RBC / HPF 7-10  <3 RBC/hpf   Bacteria, UA FEW (*) RARE   Crystals CA OXALATE CRYSTALS (*) NEGATIVE   Urine-Other TRICHOMONAS PRESENT    URINE CULTURE      Component Value Range   Specimen Description URINE, CLEAN CATCH     Special Requests NONE     Culture  Setup Time 02/07/2012 22:31     Colony Count NO GROWTH     Culture NO GROWTH     Report Status 02/09/2012 FINAL     s   1. UTI (lower urinary tract infection)   2. Trichomonas   3. Pharyngitis   4. Ovarian cyst     Urine culture pending  MDM     Patient  has ttp of the lumbar paraspinal muscles.  No focal neuro deficits on exam, no fever, CVA tenderness or vomiting to suggest pyleo.  abd is soft, NT. No vag d/c or pelvic pain.  Post menopausal.   No peritoneal signs.    Ambulates with a steady gait.     I will treat for trichomonas and prescribe keflex for possible UTI and pharyngitis.  Pt agrees to close f/u with her PMD or to return here if sx's worsen.  The patient appears reasonably screened and/or stabilized for discharge and I doubt any other medical condition or other [facility] requiring further screening, evaluation, or treatment in the ED at this time prior to discharge.    Hanya Guerin L. [place], [place] 02/10/12 1638

## 2012-02-11 NOTE — ED Provider Notes (Signed)
Medical screening examination/treatment/procedure(s) were performed by non-physician practitioner and as supervising physician I was immediately available for consultation/collaboration.  Carynn Felling L Jaicob Dia, MD 02/11/12 0928 

## 2012-03-20 ENCOUNTER — Other Ambulatory Visit (HOSPITAL_COMMUNITY): Payer: Self-pay | Admitting: Internal Medicine

## 2012-03-20 DIAGNOSIS — R109 Unspecified abdominal pain: Secondary | ICD-10-CM

## 2012-03-22 ENCOUNTER — Other Ambulatory Visit (HOSPITAL_COMMUNITY): Payer: Medicaid Other

## 2012-03-29 ENCOUNTER — Other Ambulatory Visit (HOSPITAL_COMMUNITY): Payer: Medicaid Other

## 2012-04-20 ENCOUNTER — Ambulatory Visit (HOSPITAL_COMMUNITY): Payer: Medicaid Other

## 2012-04-21 ENCOUNTER — Ambulatory Visit (HOSPITAL_COMMUNITY): Payer: Medicaid Other

## 2012-08-25 ENCOUNTER — Encounter (HOSPITAL_COMMUNITY): Payer: Self-pay | Admitting: *Deleted

## 2012-08-25 ENCOUNTER — Emergency Department (HOSPITAL_COMMUNITY): Payer: Medicaid Other

## 2012-08-25 ENCOUNTER — Observation Stay (HOSPITAL_COMMUNITY)
Admission: EM | Admit: 2012-08-25 | Discharge: 2012-08-26 | Disposition: A | Discharge status: Home / Self Care | Payer: Medicaid Other | Attending: Internal Medicine | Admitting: Internal Medicine

## 2012-08-25 DIAGNOSIS — F1320 Sedative, hypnotic or anxiolytic dependence, uncomplicated: Secondary | ICD-10-CM | POA: Diagnosis present

## 2012-08-25 DIAGNOSIS — F410 Panic disorder [episodic paroxysmal anxiety] without agoraphobia: Secondary | ICD-10-CM | POA: Diagnosis present

## 2012-08-25 DIAGNOSIS — F17200 Nicotine dependence, unspecified, uncomplicated: Secondary | ICD-10-CM | POA: Diagnosis present

## 2012-08-25 DIAGNOSIS — K219 Gastro-esophageal reflux disease without esophagitis: Secondary | ICD-10-CM | POA: Diagnosis present

## 2012-08-25 DIAGNOSIS — R079 Chest pain, unspecified: Principal | ICD-10-CM | POA: Insufficient documentation

## 2012-08-25 DIAGNOSIS — F319 Bipolar disorder, unspecified: Secondary | ICD-10-CM | POA: Diagnosis present

## 2012-08-25 DIAGNOSIS — J449 Chronic obstructive pulmonary disease, unspecified: Secondary | ICD-10-CM | POA: Diagnosis present

## 2012-08-25 DIAGNOSIS — J4489 Other specified chronic obstructive pulmonary disease: Secondary | ICD-10-CM | POA: Insufficient documentation

## 2012-08-25 DIAGNOSIS — F19939 Other psychoactive substance use, unspecified with withdrawal, unspecified: Secondary | ICD-10-CM | POA: Insufficient documentation

## 2012-08-25 DIAGNOSIS — E785 Hyperlipidemia, unspecified: Secondary | ICD-10-CM | POA: Diagnosis present

## 2012-08-25 LAB — CBC WITH DIFFERENTIAL/PLATELET
Basophils Absolute: 0 10*3/uL (ref 0.0–0.1)
Basophils Relative: 0 % (ref 0–1)
HCT: 41.9 % (ref 36.0–46.0)
Lymphocytes Relative: 22 % (ref 12–46)
MCHC: 35.3 g/dL (ref 30.0–36.0)
Monocytes Absolute: 0.8 10*3/uL (ref 0.1–1.0)
Neutro Abs: 5 10*3/uL (ref 1.7–7.7)
Neutrophils Relative %: 66 % (ref 43–77)
Platelets: 242 10*3/uL (ref 150–400)
RDW: 14.5 % (ref 11.5–15.5)
WBC: 7.6 10*3/uL (ref 4.0–10.5)

## 2012-08-25 LAB — BASIC METABOLIC PANEL
CO2: 23 mEq/L (ref 19–32)
Chloride: 104 mEq/L (ref 96–112)
GFR calc Af Amer: >90 mL/min (ref >90)
Potassium: 3.9 mEq/L (ref 3.5–5.1)
Sodium: 139 mEq/L (ref 135–145)

## 2012-08-25 LAB — RAPID URINE DRUG SCREEN, HOSP PERFORMED
Benzodiazepines: POSITIVE — AB
Cocaine: NOT DETECTED
Opiates: NOT DETECTED

## 2012-08-25 LAB — TROPONIN I
Troponin I: <0.3 ng/mL (ref <0.30)
Troponin I: <0.3 ng/mL (ref <0.30)

## 2012-08-25 LAB — D-DIMER, QUANTITATIVE: D-Dimer, Quant: <0.27 ug/mL-FEU (ref 0.00–0.48)

## 2012-08-25 MED ORDER — ONDANSETRON HCL 4 MG/2ML IJ SOLN: 4.0000 mg | Freq: Four times a day (QID) | INTRAMUSCULAR | Status: DC | PRN

## 2012-08-25 MED ORDER — ENOXAPARIN SODIUM 40 MG/0.4ML ~~LOC~~ SOLN
40.0000 mg | SUBCUTANEOUS | Status: DC
  Administered 2012-08-25 – 2012-08-26 (×2): 40 mg via SUBCUTANEOUS
  Filled 2012-08-25 (×2): qty 0.4

## 2012-08-25 MED ORDER — ALUM & MAG HYDROXIDE-SIMETH 200-200-20 MG/5ML PO SUSP: 30.0000 mL | Freq: Four times a day (QID) | ORAL | Status: DC | PRN

## 2012-08-25 MED ORDER — DOCUSATE SODIUM 100 MG PO CAPS
100.0000 mg | ORAL_CAPSULE | Freq: Two times a day (BID) | ORAL | Status: DC
  Administered 2012-08-25 – 2012-08-26 (×2): 100 mg via ORAL
  Filled 2012-08-25 (×2): qty 1

## 2012-08-25 MED ORDER — IPRATROPIUM-ALBUTEROL 20-100 MCG/ACT IN AERS
1.0000 | INHALATION_SPRAY | Freq: Four times a day (QID) | RESPIRATORY_TRACT | Status: DC
  Filled 2012-08-25: qty 4

## 2012-08-25 MED ORDER — NITROGLYCERIN 0.4 MG SL SUBL
0.4000 mg | SUBLINGUAL_TABLET | Freq: Once | SUBLINGUAL | Status: AC
  Administered 2012-08-25: 0.4 mg via SUBLINGUAL

## 2012-08-25 MED ORDER — MOMETASONE FURO-FORMOTEROL FUM 100-5 MCG/ACT IN AERO
2.0000 | INHALATION_SPRAY | Freq: Two times a day (BID) | RESPIRATORY_TRACT | Status: DC
  Administered 2012-08-25 – 2012-08-26 (×2): 2 via RESPIRATORY_TRACT
  Filled 2012-08-25: qty 8.8

## 2012-08-25 MED ORDER — MORPHINE SULFATE 2 MG/ML IJ SOLN: 2.0000 mg | INTRAMUSCULAR | Status: DC | PRN

## 2012-08-25 MED ORDER — MORPHINE SULFATE 2 MG/ML IJ SOLN
2.0000 mg | Freq: Once | INTRAMUSCULAR | Status: AC
  Administered 2012-08-25: 2 mg via INTRAVENOUS
  Filled 2012-08-25: qty 1

## 2012-08-25 MED ORDER — SODIUM CHLORIDE 0.9 % IV SOLN
INTRAVENOUS | Status: DC
  Administered 2012-08-25 (×2): via INTRAVENOUS

## 2012-08-25 MED ORDER — NITROGLYCERIN 0.4 MG SL SUBL
SUBLINGUAL_TABLET | SUBLINGUAL | Status: AC
  Administered 2012-08-25: 0.4 mg via SUBLINGUAL
  Filled 2012-08-25: qty 25

## 2012-08-25 MED ORDER — OXYCODONE HCL 5 MG PO TABS
5.0000 mg | ORAL_TABLET | ORAL | Status: DC | PRN
  Administered 2012-08-26: 5 mg via ORAL
  Filled 2012-08-25: qty 1

## 2012-08-25 MED ORDER — ASPIRIN 81 MG PO CHEW
324.0000 mg | CHEWABLE_TABLET | Freq: Once | ORAL | Status: AC
  Administered 2012-08-25: 324 mg via ORAL

## 2012-08-25 MED ORDER — ONDANSETRON HCL 4 MG PO TABS: 4.0000 mg | ORAL_TABLET | Freq: Four times a day (QID) | ORAL | Status: DC | PRN

## 2012-08-25 MED ORDER — ACETAMINOPHEN 325 MG PO TABS: 650.0000 mg | ORAL_TABLET | Freq: Four times a day (QID) | ORAL | Status: DC | PRN

## 2012-08-25 MED ORDER — ASPIRIN 81 MG PO CHEW
CHEWABLE_TABLET | ORAL | Status: AC
  Administered 2012-08-25: 324 mg via ORAL
  Filled 2012-08-25: qty 4

## 2012-08-25 MED ORDER — IPRATROPIUM-ALBUTEROL 18-103 MCG/ACT IN AERO
2.0000 | INHALATION_SPRAY | Freq: Four times a day (QID) | RESPIRATORY_TRACT | Status: DC
  Administered 2012-08-25 – 2012-08-26 (×3): 2 via RESPIRATORY_TRACT
  Filled 2012-08-25: qty 14.7

## 2012-08-25 MED ORDER — SODIUM CHLORIDE 0.9 % IV SOLN: INTRAVENOUS | Status: DC

## 2012-08-25 MED ORDER — HYDROMORPHONE HCL PF 1 MG/ML IJ SOLN
1.0000 mg | Freq: Once | INTRAMUSCULAR | Status: AC
  Administered 2012-08-25: 1 mg via INTRAVENOUS
  Filled 2012-08-25: qty 1

## 2012-08-25 MED ORDER — ALPRAZOLAM 1 MG PO TABS
1.0000 mg | ORAL_TABLET | Freq: Three times a day (TID) | ORAL | Status: DC
  Administered 2012-08-25 – 2012-08-26 (×5): 1 mg via ORAL
  Filled 2012-08-25 (×5): qty 1

## 2012-08-25 MED ORDER — SENNOSIDES-DOCUSATE SODIUM 8.6-50 MG PO TABS: 1.0000 | ORAL_TABLET | Freq: Every evening | ORAL | Status: DC | PRN

## 2012-08-25 MED ORDER — ALPRAZOLAM 0.5 MG PO TABS
1.0000 mg | ORAL_TABLET | Freq: Once | ORAL | Status: AC
Start: 2012-08-25 — End: 2012-08-25
  Administered 2012-08-25: 1 mg via ORAL
  Filled 2012-08-25: qty 2

## 2012-08-25 MED ORDER — KETOROLAC TROMETHAMINE 30 MG/ML IJ SOLN
30.0000 mg | Freq: Once | INTRAMUSCULAR | Status: AC
  Administered 2012-08-25: 30 mg via INTRAVENOUS
  Filled 2012-08-25: qty 1

## 2012-08-25 MED ORDER — ACETAMINOPHEN 650 MG RE SUPP: 650.0000 mg | Freq: Four times a day (QID) | RECTAL | Status: DC | PRN

## 2012-08-25 MED ORDER — ASPIRIN EC 81 MG PO TBEC
81.0000 mg | DELAYED_RELEASE_TABLET | Freq: Every day | ORAL | Status: DC
  Administered 2012-08-26: 81 mg via ORAL
  Filled 2012-08-25: qty 1

## 2012-08-25 MED ORDER — ONDANSETRON HCL 4 MG/2ML IJ SOLN
4.0000 mg | Freq: Once | INTRAMUSCULAR | Status: AC
  Administered 2012-08-25: 4 mg via INTRAVENOUS
  Filled 2012-08-25: qty 2

## 2012-08-25 NOTE — ED Provider Notes (Addendum)
History     This chart was scribed for Shelda Jakes, MD, MD by Smitty Pluck, ED Scribe. The patient was seen in room APA15/APA15 and the patient's care was started at 12:45 PM.   CSN: [id number]  Arrival date & time 08/25/12  1117      Chief Complaint  Patient presents with  . Chest Pain    (Consider location/radiation/quality/duration/timing/severity/associated sxs/prior treatment) Patient is a 57 y.o. female presenting with chest pain. The history is provided by the patient and medical records. No language interpreter was used.  Chest Pain Pain location:  Substernal area Pain radiates to:  L arm Pain radiates to the back: no   Pain severity:  Severe Onset quality:  Sudden Timing:  Constant Chronicity:  Recurrent Worsened by:  Nothing tried Ineffective treatments:  Nitroglycerin Associated symptoms: headache and weakness   Associated symptoms: no abdominal pain, no back pain, no diaphoresis, no fever, no nausea and not vomiting    [name] is a 57 y.o. female with hx of COPD who presents to the Emergency Department complaining of substernal chest pain radiates to left arm onset today 3 hours ago. She describes the pain as heaviness and tightness. Pain is rated at 9/10.  She denies radiation. Pt mentions SOB.  She reports having similar pain 3 days ago but was not seen by doctor. She reports that she took 3 nitroglycerin with minor relief 3 days ago. She took nitroglycerin and 4 asa (81 mg) today with minor relief. She reports having mild blurred vision and headache. Pt denies fever, chills, nausea, vomiting, diarrhea, weakness, cough and any other pain.  PCP is Dr. Sherwood Gambler   Past Medical History  Diagnosis Date  . COPD (chronic obstructive pulmonary disease)     Past Surgical History  Procedure Laterality Date  . Kidney stone surgery    . Cholecystectomy    . Hernia repair      History reviewed. No pertinent family history.  History  Substance Use  Topics  . Smoking status: Current Every Day Smoker  . Smokeless tobacco: Not on file  . Alcohol Use: Yes    OB History   Grav Para Term Preterm Abortions TAB SAB Ect Mult Living                  Review of Systems  Constitutional: Negative for fever, chills and diaphoresis.  HENT: Negative for congestion and neck pain.   Eyes: Positive for visual disturbance.  Cardiovascular: Positive for chest pain.  Gastrointestinal: Negative for nausea, vomiting, abdominal pain and diarrhea.  Genitourinary: Negative for dysuria.  Musculoskeletal: Negative for back pain.  Skin: Negative for rash.  Neurological: Positive for weakness and headaches.  Hematological: Does not bruise/bleed easily.  All other systems reviewed and are negative.    Allergies  Penicillins and Septra  Home Medications   Current Outpatient Rx  Name  Route  Sig  Dispense  Refill  . albuterol-ipratropium (COMBIVENT) 18-103 MCG/ACT inhaler   Inhalation   Inhale 2 puffs into the lungs every 6 (six) hours as needed for shortness of breath.          . ALPRAZolam (XANAX) 1 MG tablet   Oral   Take 1 mg by mouth 4 (four) times daily as needed for sleep.          Marland Kitchen Fluticasone-Salmeterol (ADVAIR) 250-50 MCG/DOSE AEPB   Inhalation   Inhale 1 puff into the lungs every 12 (twelve) hours.         Marland Kitchen  nitroGLYCERIN (NITROSTAT) 0.4 MG SL tablet   Sublingual   Place 0.4 mg under the tongue every 5 (five) minutes as needed for chest pain.           BP 129/73  Pulse 61  Temp(Src) 98.5 F (36.9 C) (Oral)  Resp 18  Ht 5\' 4"  (1.626 m)  Wt 178 lb (80.74 kg)  BMI 30.54 kg/m2  SpO2 96%  Physical Exam  Nursing note and vitals reviewed. Constitutional: She is oriented to person, place, and time. She appears well-developed and well-nourished. No distress.  HENT:  Head: Normocephalic and atraumatic.  Eyes: EOM are normal. Pupils are equal, round, and reactive to light.  Neck: Normal range of motion. Neck supple. No  tracheal deviation present.  Cardiovascular: Normal rate, regular rhythm and normal heart sounds.   No murmur heard. Pulmonary/Chest: Effort normal and breath sounds normal. No respiratory distress. She has no wheezes. She has no rales.  Abdominal: Soft. Bowel sounds are normal. She exhibits no distension. There is no tenderness. There is no rebound and no guarding.  Musculoskeletal: Normal range of motion. She exhibits no edema and no tenderness.  Neurological: She is alert and oriented to person, place, and time.  Skin: Skin is warm and dry.  Psychiatric: She has a normal mood and affect. Her behavior is normal.    ED Course  Procedures (including critical care time) DIAGNOSTIC STUDIES: Oxygen Saturation is 96% on room air, adeqaute by my interpretation.    COORDINATION OF CARE: 12:52 PM Discussed ED treatment with pt and pt agrees.     Labs Reviewed  BASIC METABOLIC PANEL - Abnormal; Notable for the following:    Glucose, Bld 113 (*)    All other components within normal limits  CBC WITH DIFFERENTIAL  TROPONIN I  TROPONIN I  D-DIMER, QUANTITATIVE   Dg Chest 2 View  08/25/2012  *RADIOLOGY REPORT*  Clinical Data: Chest pain  CHEST - 2 VIEW  Comparison: 04/27/2009  Findings: Hyperinflation of the lungs.  Mild peribronchial thickening.  This is similar to prior study.  Heart is normal size. No effusions.  No acute bony abnormality.  IMPRESSION: Hyperinflation, chronic bronchitic changes.  No acute findings.   Original Report Authenticated By: Charlett Nose, M.D.     Date: 08/25/2012  Rate: 79  Rhythm: normal sinus rhythm  QRS Axis: normal  Intervals: normal  ST/T Wave abnormalities: nonspecific T wave changes  Conduction Disutrbances:none  Narrative Interpretation:   Old EKG Reviewed: changes noted Compared to 01/27/2008 the lateral T waves are more peaked.   Results for orders placed during the hospital encounter of 08/25/12  CBC WITH DIFFERENTIAL      Result Value Range    WBC 7.6  4.0 - 10.5 K/uL   RBC 4.36  3.87 - 5.11 MIL/uL   Hemoglobin 14.8  12.0 - 15.0 g/dL   HCT 16.1  09.6 - 04.5 %   MCV 96.1  78.0 - 100.0 fL   MCH 33.9  26.0 - 34.0 pg   MCHC 35.3  30.0 - 36.0 g/dL   RDW 40.9  81.1 - 91.4 %   Platelets 242  150 - 400 K/uL   Neutrophils Relative 66  43 - 77 %   Neutro Abs 5.0  1.7 - 7.7 K/uL   Lymphocytes Relative 22  12 - 46 %   Lymphs Abs 1.7  0.7 - 4.0 K/uL   Monocytes Relative 10  3 - 12 %   Monocytes Absolute 0.8  0.1 -  1.0 K/uL   Eosinophils Relative 2  0 - 5 %   Eosinophils Absolute 0.2  0.0 - 0.7 K/uL   Basophils Relative 0  0 - 1 %   Basophils Absolute 0.0  0.0 - 0.1 K/uL  BASIC METABOLIC PANEL      Result Value Range   Sodium 139  135 - 145 mEq/L   Potassium 3.9  3.5 - 5.1 mEq/L   Chloride 104  96 - 112 mEq/L   CO2 23  19 - 32 mEq/L   Glucose, Bld 113 (*) 70 - 99 mg/dL   BUN 13  6 - 23 mg/dL   Creatinine, Ser 1.61  0.50 - 1.10 mg/dL   Calcium 9.7  8.4 - 09.6 mg/dL   GFR calc non Af Amer >90  >90 mL/min   GFR calc Af Amer >90  >90 mL/min  TROPONIN I      Result Value Range   Troponin I <0.30  <0.30 ng/mL  TROPONIN I      Result Value Range   Troponin I <0.30  <0.30 ng/mL  D-DIMER, QUANTITATIVE      Result Value Range   D-Dimer, Quant <0.27  0.00 - 0.48 ug/mL-FEU      1. Chest pain       MDM   Patient with persistent chest pain improves some with nitroglycerin some with morphine some with the lauded it is improving but not resolved. Onset of the chest pain was at 9:30 this morning. EKG without any acute changes troponins x2 of the negative d-dimer was negative. Chest x-ray negative for pneumonia or pneumothorax suggestive of COPD which she is known to have. Patient has no coronary artery disease history. Patient did take some nitroglycerin prior to coming in those were her husband's.  Discussed with hospitalist team here they will most likely admit the patient to telemetry unit up with an temporary middle  orders.  Patient's workup here not consistent with an acute cardiac event but since she's having persistent pain she needs a formal rule out. No evidence of pulmonary embolism no evidence of pulmonary edema pneumothorax or pneumonia. Also no evidence of acute cardiac event.   I personally performed the services described in this documentation, which was scribed in my presence. The recorded information has been reviewed and is accurate.     Shelda Jakes, MD 08/25/12 0454  Shelda Jakes, MD 08/25/12 [phone]

## 2012-08-25 NOTE — ED Notes (Signed)
Chest pain with radiation down lt arm, numb sensation to lt hand,  Had similar episode 2 days ago , took ntg and was better,  Did not  Take ntg today says she was out.  Alert, says she feels sob.
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
## 2012-08-26 ENCOUNTER — Other Ambulatory Visit: Payer: Self-pay

## 2012-08-26 DIAGNOSIS — K219 Gastro-esophageal reflux disease without esophagitis: Secondary | ICD-10-CM

## 2012-08-26 DIAGNOSIS — F1320 Sedative, hypnotic or anxiolytic dependence, uncomplicated: Secondary | ICD-10-CM

## 2012-08-26 LAB — LIPID PANEL
Cholesterol: 157 mg/dL (ref 0–200)
LDL Cholesterol: 87 mg/dL (ref 0–99)
Total CHOL/HDL Ratio: 3.4 RATIO
VLDL: 24 mg/dL (ref 0–40)

## 2012-08-26 MED ORDER — IBUPROFEN 200 MG PO TABS: 400.0000 mg | ORAL_TABLET | Freq: Three times a day (TID) | ORAL | Status: DC

## 2012-08-26 MED ORDER — ASPIRIN 81 MG PO TBEC: 81.0000 mg | DELAYED_RELEASE_TABLET | Freq: Every day | ORAL | Status: DC

## 2012-08-26 MED ORDER — NITROGLYCERIN 0.4 MG SL SUBL
SUBLINGUAL_TABLET | SUBLINGUAL | Status: AC
  Administered 2012-08-26: 09:00:00 via SUBLINGUAL
  Filled 2012-08-26: qty 25

## 2012-08-26 MED ORDER — NITROGLYCERIN 0.4 MG SL SUBL: 0.4000 mg | SUBLINGUAL_TABLET | SUBLINGUAL | Status: DC | PRN

## 2012-08-26 MED ORDER — OMEPRAZOLE 40 MG PO CPDR: 40.0000 mg | DELAYED_RELEASE_CAPSULE | Freq: Every day | ORAL | Status: DC

## 2012-08-26 MED ORDER — ALPRAZOLAM 0.5 MG PO TABS: 0.5000 mg | ORAL_TABLET | Freq: Three times a day (TID) | ORAL | Status: DC | PRN

## 2012-08-26 MED ORDER — ACETAMINOPHEN 325 MG PO TABS: 650.0000 mg | ORAL_TABLET | Freq: Four times a day (QID) | ORAL | Status: DC | PRN

## 2012-08-26 NOTE — Plan of Care (Signed)
Problem: Discharge Progression Outcomes Goal: Other Discharge Outcomes/Goals Outcome: Completed/Met Date Met:  08/26/12 Smoking sesation discussed with pt.  Handouts given  Discharged to home with brother
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
## 2012-08-30 NOTE — Progress Notes (Signed)
UR Chart Review Completed  

## 2013-02-19 ENCOUNTER — Ambulatory Visit: Payer: Self-pay | Admitting: Obstetrics & Gynecology

## 2013-05-05 ENCOUNTER — Encounter (HOSPITAL_COMMUNITY): Payer: Self-pay | Admitting: Emergency Medicine

## 2013-05-05 ENCOUNTER — Emergency Department (HOSPITAL_COMMUNITY)
Admission: EM | Admit: 2013-05-05 | Discharge: 2013-05-05 | Disposition: A | Discharge status: Home / Self Care | Payer: Medicaid Other | Attending: Emergency Medicine | Admitting: Emergency Medicine

## 2013-05-05 ENCOUNTER — Emergency Department (HOSPITAL_COMMUNITY): Payer: Medicaid Other

## 2013-05-05 DIAGNOSIS — Z880 Allergy status to penicillin: Secondary | ICD-10-CM | POA: Insufficient documentation

## 2013-05-05 DIAGNOSIS — J441 Chronic obstructive pulmonary disease with (acute) exacerbation: Secondary | ICD-10-CM

## 2013-05-05 DIAGNOSIS — R197 Diarrhea, unspecified: Secondary | ICD-10-CM | POA: Insufficient documentation

## 2013-05-05 DIAGNOSIS — F17200 Nicotine dependence, unspecified, uncomplicated: Secondary | ICD-10-CM | POA: Insufficient documentation

## 2013-05-05 DIAGNOSIS — IMO0002 Reserved for concepts with insufficient information to code with codable children: Secondary | ICD-10-CM | POA: Insufficient documentation

## 2013-05-05 MED ORDER — ALBUTEROL SULFATE HFA 108 (90 BASE) MCG/ACT IN AERS
2.0000 | INHALATION_SPRAY | Freq: Once | RESPIRATORY_TRACT | Status: AC
  Administered 2013-05-05: 2 via RESPIRATORY_TRACT
  Filled 2013-05-05: qty 6.7

## 2013-05-05 NOTE — ED Notes (Signed)
Patient transported to X-ray via wheelchair 

## 2013-05-05 NOTE — ED Provider Notes (Signed)
CSN: [id number]     Arrival date & time 05/05/13  1851 History   First MD Initiated Contact with Patient 05/05/13 1947     This chart was scribed for Ward Givens, MD by Arlan Organ, ED Scribe. This patient was seen in room APA08/APA08 and the patient's care was started 7:49 PM.   Chief Complaint  Patient presents with  . Shortness of Breath   The history is provided by the patient. No language interpreter was used.   HPI Comments: [name] is a 57 y.o. Female with a hx of COPD who presents to the Emergency Department complaining of SOB that started 2 days ago, but has worsened yesterday morning. Pt also reports an associated productive cough consisting of white sputum, diarrhea which she experiences about twice a day, and rhinorrhea consisting of white discharge is sometimes sees blood from her left nostril. She states she started wheezing today. She denies fever or chills. Pt states she currently has an inhaler at home, but it does not working properly. Pt denies sore throat, fever, or chills. Pt is currently on disability for COPD and back problems. She smokes about a pack of cigarettes every 3 days, but states she used to smoke 2 packs a day until 8 months ago.   Pt is currently prescribed prozac, but is currently not taking it due to SI side effects.  She denies being suicidal at this time. She is also taking Abilify when necessary for her bipolar illness. She states she is out of a number of her medications, and says she can not get them refilled until this coming Tuesday 11/4.  Patient's grandson is in the ED with similar symptoms. He is approximately 58 years old  PCP Dr Sherwood Gambler  Past Medical History  Diagnosis Date  . COPD (chronic obstructive pulmonary disease)    Past Surgical History  Procedure Laterality Date  . Kidney stone surgery    . Cholecystectomy    . Hernia repair     History reviewed. No pertinent family history. History  Substance Use Topics  . Smoking  status: Current Every Day Smoker  . Smokeless tobacco: Not on file  . Alcohol Use: No   Lives at home Lives with grandson (child) Smokes 1/3 ppd, down from 2 ppd 8 months ago On disability for bipolar/COPD  OB History   Grav Para Term Preterm Abortions TAB SAB Ect Mult Living                 Review of Systems  HENT: Positive for rhinorrhea.   Respiratory: Positive for cough and shortness of breath.   Gastrointestinal: Positive for diarrhea.  All other systems reviewed and are negative.    Allergies  Penicillins and Septra  Home Medications   Current Outpatient Rx  Name  Route  Sig  Dispense  Refill  . ALPRAZolam (XANAX) 1 MG tablet   Oral   Take 1 mg by mouth 4 (four) times daily as needed for sleep.         . budesonide-formoterol (SYMBICORT) 160-4.5 MCG/ACT inhaler   Inhalation   Inhale 2 puffs into the lungs 2 (two) times daily.         Marland Kitchen albuterol-ipratropium (COMBIVENT) 18-103 MCG/ACT inhaler   Inhalation   Inhale 2 puffs into the lungs every 6 (six) hours as needed for shortness of breath.     Ran out     . Fluticasone-Salmeterol (ADVAIR) 250-50 MCG/DOSE AEPB   Inhalation   Inhale  1 puff into the lungs every 12 (twelve) hours.    Ran out 2 days ago      BP 157/74  Pulse 86  Temp(Src) 98.3 F (36.8 C) (Oral)  Ht 5\' 4"  (1.626 m)  Wt 170 lb (77.111 kg)  BMI 29.17 kg/m2  SpO2 96%  Vital signs normal    Physical Exam  Nursing note and vitals reviewed. Constitutional: She is oriented to person, place, and time. She appears well-developed and well-nourished.  Non-toxic appearance. She does not appear ill. No distress.  HENT:  Head: Normocephalic and atraumatic.  Right Ear: External ear normal.  Left Ear: External ear normal.  Nose: Nose normal. No mucosal edema or rhinorrhea.  Mouth/Throat: Oropharynx is clear and moist and mucous membranes are normal. No dental abscesses or uvula swelling.  Eyes: Conjunctivae and EOM are normal. Pupils are  equal, round, and reactive to light.  Neck: Normal range of motion and full passive range of motion without pain. Neck supple.  Cardiovascular: Normal rate, regular rhythm and normal heart sounds.  Exam reveals no gallop and no friction rub.   No murmur heard. Pulmonary/Chest: Effort normal and breath sounds normal. No respiratory distress. She has no wheezes. She has no rhonchi. She has no rales. She exhibits no tenderness and no crepitus.  Abdominal: Soft. Normal appearance and bowel sounds are normal. She exhibits no distension. There is no tenderness. There is no rebound and no guarding.  Musculoskeletal: Normal range of motion. She exhibits no edema and no tenderness.  Moves all extremities well.   Neurological: She is alert and oriented to person, place, and time. She has normal strength. No cranial nerve deficit.  Skin: Skin is warm, dry and intact. No rash noted. No erythema. No pallor.  Psychiatric: She has a normal mood and affect. Her speech is normal and behavior is normal. Her mood appears not anxious.    ED Course  Procedures (including critical care time)  Medications  albuterol (PROVENTIL HFA;VENTOLIN HFA) 108 (90 BASE) MCG/ACT inhaler 2 puff (2 puffs Inhalation Given 05/05/13 2030)     DIAGNOSTIC STUDIES: Oxygen Saturation is 96% on RA, Adequate by my interpretation.    COORDINATION OF CARE: 7:48 PM- Will give albuterol. Will order CXR. Discussed treatment plan with pt at bedside and pt agreed to plan.     10:01 PM- Recheck after 2 puffs of albuterol, pt states she feels improvement. Patient mainly ran out of her albuterol inhaler and her Combivent inhaler. She has her stem to court inhaler. At this time I do not feel antibiotics are in her gait. She's only had symptoms today and is coughing up white sputum and having nasal discharge is white. She's had no fever or chills. Labs Review  Imaging Review Dg Chest 2 View  05/05/2013   CLINICAL DATA:  Cough and congestion   EXAM: CHEST  2 VIEW  COMPARISON:  08/25/2012  FINDINGS: The heart pulmonary vascularity are within normal limits. The lungs are well aerated bilaterally without focal infiltrate or effusion. No acute bony abnormality is noted.  IMPRESSION: No acute abnormality noted.   Electronically Signed   By: Alcide Clever M.D.   On: 05/05/2013 21:17    EKG Interpretation   None       MDM   1. COPD with exacerbation    Plan discharge   Devoria Albe, MD, FACEP  I personally performed the services described in this documentation, which was scribed in my presence. The recorded information has been  reviewed and considered.  Devoria Albe, MD, Armando Gang   Ward Givens, MD 05/05/13 [phone]
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
## 2013-05-05 NOTE — ED Notes (Signed)
Patient: -DC'd in no apparent distress with RR and effort WDL -ambulatory out of the ED with stable gait noted and companion at side -reported understanding DC instructions -stable vitals noted at DC  

## 2013-05-05 NOTE — ED Notes (Signed)
Pt states she has been sob since yesterday morning. Pt states she only has an inhaler at home and it isn't working.

## 2013-07-05 DIAGNOSIS — J189 Pneumonia, unspecified organism: Secondary | ICD-10-CM

## 2013-07-05 HISTORY — DX: Pneumonia, unspecified organism: J18.9

## 2013-07-07 ENCOUNTER — Encounter (HOSPITAL_COMMUNITY): Payer: Self-pay | Admitting: Emergency Medicine

## 2013-07-07 ENCOUNTER — Inpatient Hospital Stay (HOSPITAL_COMMUNITY)
Admission: EM | Admit: 2013-07-07 | Discharge: 2013-07-12 | DRG: 193 | Disposition: A | Discharge status: Home / Self Care | Payer: Medicaid Other | Attending: Internal Medicine | Admitting: Internal Medicine

## 2013-07-07 ENCOUNTER — Emergency Department (HOSPITAL_COMMUNITY): Payer: Medicaid Other

## 2013-07-07 DIAGNOSIS — R7309 Other abnormal glucose: Secondary | ICD-10-CM | POA: Diagnosis present

## 2013-07-07 DIAGNOSIS — I10 Essential (primary) hypertension: Secondary | ICD-10-CM | POA: Diagnosis present

## 2013-07-07 DIAGNOSIS — R0902 Hypoxemia: Secondary | ICD-10-CM

## 2013-07-07 DIAGNOSIS — J189 Pneumonia, unspecified organism: Principal | ICD-10-CM | POA: Diagnosis present

## 2013-07-07 DIAGNOSIS — F1320 Sedative, hypnotic or anxiolytic dependence, uncomplicated: Secondary | ICD-10-CM

## 2013-07-07 DIAGNOSIS — J441 Chronic obstructive pulmonary disease with (acute) exacerbation: Secondary | ICD-10-CM | POA: Diagnosis present

## 2013-07-07 DIAGNOSIS — F17200 Nicotine dependence, unspecified, uncomplicated: Secondary | ICD-10-CM | POA: Diagnosis present

## 2013-07-07 DIAGNOSIS — R079 Chest pain, unspecified: Secondary | ICD-10-CM

## 2013-07-07 DIAGNOSIS — J449 Chronic obstructive pulmonary disease, unspecified: Secondary | ICD-10-CM | POA: Diagnosis present

## 2013-07-07 DIAGNOSIS — F410 Panic disorder [episodic paroxysmal anxiety] without agoraphobia: Secondary | ICD-10-CM

## 2013-07-07 DIAGNOSIS — J9600 Acute respiratory failure, unspecified whether with hypoxia or hypercapnia: Secondary | ICD-10-CM | POA: Diagnosis present

## 2013-07-07 DIAGNOSIS — K219 Gastro-esophageal reflux disease without esophagitis: Secondary | ICD-10-CM | POA: Diagnosis present

## 2013-07-07 DIAGNOSIS — Z87442 Personal history of urinary calculi: Secondary | ICD-10-CM

## 2013-07-07 DIAGNOSIS — E876 Hypokalemia: Secondary | ICD-10-CM | POA: Diagnosis present

## 2013-07-07 DIAGNOSIS — E860 Dehydration: Secondary | ICD-10-CM | POA: Diagnosis present

## 2013-07-07 DIAGNOSIS — D72829 Elevated white blood cell count, unspecified: Secondary | ICD-10-CM | POA: Diagnosis present

## 2013-07-07 DIAGNOSIS — J9601 Acute respiratory failure with hypoxia: Secondary | ICD-10-CM | POA: Diagnosis present

## 2013-07-07 DIAGNOSIS — F319 Bipolar disorder, unspecified: Secondary | ICD-10-CM | POA: Diagnosis present

## 2013-07-07 DIAGNOSIS — T380X5A Adverse effect of glucocorticoids and synthetic analogues, initial encounter: Secondary | ICD-10-CM | POA: Diagnosis present

## 2013-07-07 DIAGNOSIS — F411 Generalized anxiety disorder: Secondary | ICD-10-CM | POA: Diagnosis present

## 2013-07-07 DIAGNOSIS — J40 Bronchitis, not specified as acute or chronic: Secondary | ICD-10-CM

## 2013-07-07 DIAGNOSIS — E785 Hyperlipidemia, unspecified: Secondary | ICD-10-CM

## 2013-07-07 LAB — COMPREHENSIVE METABOLIC PANEL
ALT: 21 U/L (ref 0–35)
AST: 38 U/L — AB (ref 0–37)
Albumin: 3.7 g/dL (ref 3.5–5.2)
Alkaline Phosphatase: 108 U/L (ref 39–117)
BUN: 16 mg/dL (ref 6–23)
CALCIUM: 10 mg/dL (ref 8.4–10.5)
CO2: 24 meq/L (ref 19–32)
Chloride: 92 mEq/L — ABNORMAL LOW (ref 96–112)
Creatinine, Ser: 0.85 mg/dL (ref 0.50–1.10)
GFR calc Af Amer: 86 mL/min — ABNORMAL LOW (ref >90)
GFR calc non Af Amer: 74 mL/min — ABNORMAL LOW (ref >90)
Glucose, Bld: 105 mg/dL — ABNORMAL HIGH (ref 70–99)
Potassium: 3.5 mEq/L — ABNORMAL LOW (ref 3.7–5.3)
SODIUM: 133 meq/L — AB (ref 137–147)
TOTAL PROTEIN: 7.3 g/dL (ref 6.0–8.3)
Total Bilirubin: 0.7 mg/dL (ref 0.3–1.2)

## 2013-07-07 LAB — CBC WITH DIFFERENTIAL/PLATELET
BASOS ABS: 0 10*3/uL (ref 0.0–0.1)
Basophils Relative: 0 % (ref 0–1)
EOS ABS: 0.2 10*3/uL (ref 0.0–0.7)
EOS PCT: 2 % (ref 0–5)
HEMATOCRIT: 41.2 % (ref 36.0–46.0)
HEMOGLOBIN: 15.1 g/dL — AB (ref 12.0–15.0)
LYMPHS ABS: 3.2 10*3/uL (ref 0.7–4.0)
Lymphocytes Relative: 24 % (ref 12–46)
MCH: 34.2 pg — AB (ref 26.0–34.0)
MCHC: 36.7 g/dL — AB (ref 30.0–36.0)
MCV: 93.2 fL (ref 78.0–100.0)
MONO ABS: 1.4 10*3/uL — AB (ref 0.1–1.0)
MONOS PCT: 11 % (ref 3–12)
NEUTROS ABS: 8.1 10*3/uL — AB (ref 1.7–7.7)
Neutrophils Relative %: 63 % (ref 43–77)
Platelets: 186 10*3/uL (ref 150–400)
RBC: 4.42 MIL/uL (ref 3.87–5.11)
RDW: 13.3 % (ref 11.5–15.5)
WBC: 13 10*3/uL — ABNORMAL HIGH (ref 4.0–10.5)

## 2013-07-07 MED ORDER — ALBUTEROL SULFATE (2.5 MG/3ML) 0.083% IN NEBU
5.0000 mg | INHALATION_SOLUTION | Freq: Once | RESPIRATORY_TRACT | Status: AC
  Administered 2013-07-07: 5 mg via RESPIRATORY_TRACT
  Filled 2013-07-07: qty 6

## 2013-07-07 MED ORDER — ACETAMINOPHEN 325 MG PO TABS
650.0000 mg | ORAL_TABLET | Freq: Once | ORAL | Status: AC
  Administered 2013-07-07: 650 mg via ORAL
  Filled 2013-07-07: qty 2

## 2013-07-07 MED ORDER — LEVOFLOXACIN IN D5W 500 MG/100ML IV SOLN
500.0000 mg | Freq: Once | INTRAVENOUS | Status: AC
  Administered 2013-07-07: 500 mg via INTRAVENOUS
  Filled 2013-07-07: qty 100

## 2013-07-07 MED ORDER — ALBUTEROL SULFATE (5 MG/ML) 0.5% IN NEBU
5.0000 mg | INHALATION_SOLUTION | Freq: Once | RESPIRATORY_TRACT | Status: AC
  Administered 2013-07-07: 5 mg via RESPIRATORY_TRACT
  Filled 2013-07-07: qty 6

## 2013-07-07 MED ORDER — SODIUM CHLORIDE 0.9 % IV SOLN
INTRAVENOUS | Status: DC
  Administered 2013-07-07: 22:00:00 via INTRAVENOUS

## 2013-07-07 MED ORDER — IPRATROPIUM BROMIDE 0.02 % IN SOLN
0.5000 mg | Freq: Once | RESPIRATORY_TRACT | Status: AC
  Administered 2013-07-07: 0.5 mg via RESPIRATORY_TRACT
  Filled 2013-07-07: qty 2.5

## 2013-07-07 MED ORDER — ALBUTEROL (5 MG/ML) CONTINUOUS INHALATION SOLN
INHALATION_SOLUTION | RESPIRATORY_TRACT | Status: AC
  Administered 2013-07-07: 10 mg
  Filled 2013-07-07: qty 20

## 2013-07-07 MED ORDER — METHYLPREDNISOLONE SODIUM SUCC 125 MG IJ SOLR
125.0000 mg | Freq: Once | INTRAMUSCULAR | Status: AC
  Administered 2013-07-07: 125 mg via INTRAVENOUS
  Filled 2013-07-07: qty 2

## 2013-07-07 NOTE — ED Notes (Signed)
MD at bedside. 

## 2013-07-07 NOTE — ED Provider Notes (Signed)
CSN: [id number]     Arrival date & time 07/07/13  1803 History   First MD Initiated Contact with Patient 07/07/13 2118 This chart was scribed for Janice Norrie, MD by Anastasia Pall, ED Scribe. This patient was seen in room APA17/APA17 and the patient's care was started at 9:37 PM.      Chief Complaint  Patient presents with  . Cough    The history is provided by the patient. No language interpreter was used.   HPI Comments: [name] is a 58 y.o. female who presents to the Emergency Department complaining of intermittent cough, productive of yellow/green sputum, with associated wheezing, rhinorrhea with clear discharge, and mild, intermittent diarrhea, onset 4 days ago. She reports having nebulizer/inhaler at home that she uses, with temporary relief. She denies being on 02 at home. She reports being here 3-4 months ago for wheezing symptoms. She reports it has been a long time since she has had pneumonia, possibly 5 years ago. She reports being on Advair, Symbicort and Combivent as directed. However she has run out of her Combivent. She states the Combivent works best.  She also reports sharp, intermittent chest pain, that lasts a few seconds at at time, onset 1 week ago. She denies fever, nausea, vomiting, sore throat, and any other associated symptoms. She reports h/o smoking, PPD. She states she lives with her mother.   Patient brought her grandson to the ED and had him seen in fast track and then signed herself into the ED to be seen.   PCP - Glo Herring., MD  Past Medical History  Diagnosis Date  . COPD (chronic obstructive pulmonary disease)    Past Surgical History  Procedure Laterality Date  . Kidney stone surgery    . Cholecystectomy    . Hernia repair     No family history on file. History  Substance Use Topics  . Smoking status: Current Every Day Smoker  . Smokeless tobacco: Not on file  . Alcohol Use: No   patient smokes a third pack per day  Patient  lives with her mother    OB History   Grav Para Term Preterm Abortions TAB SAB Ect Mult Living                 Review of Systems  Constitutional: Negative for fever.  HENT: Positive for rhinorrhea. Negative for sore throat.   Respiratory: Positive for cough (productive of yellow/green sputum), shortness of breath and wheezing.   Cardiovascular: Positive for chest pain.  Gastrointestinal: Positive for diarrhea. Negative for nausea and vomiting.  All other systems reviewed and are negative.    Allergies  Penicillins and Septra  Home Medications   Current Outpatient Rx  Name  Route  Sig  Dispense  Refill  . albuterol (PROVENTIL HFA;VENTOLIN HFA) 108 (90 BASE) MCG/ACT inhaler   Inhalation   Inhale 2 puffs into the lungs every 6 (six) hours as needed for wheezing or shortness of breath.         Marland Kitchen albuterol (PROVENTIL) (2.5 MG/3ML) 0.083% nebulizer solution   Nebulization   Take 2.5 mg by nebulization every 6 (six) hours as needed for wheezing or shortness of breath.         Marland Kitchen albuterol-ipratropium (COMBIVENT) 18-103 MCG/ACT inhaler   Inhalation   Inhale 2 puffs into the lungs every 6 (six) hours as needed for shortness of breath.          . Fluticasone-Salmeterol (ADVAIR) 250-50 MCG/DOSE AEPB  Inhalation   Inhale 1 puff into the lungs every 12 (twelve) hours.         Symbicort    BP 119/61  Pulse 104  Temp(Src) 98.4 F (36.9 C) (Oral)  Resp 24  Ht 5\' 4"  (1.626 m)  Wt 184 lb 3.2 oz (83.553 kg)  BMI 31.60 kg/m2  SpO2 86%  Vital signs normal except for tachycardia and hypoxia   Physical Exam  Nursing note and vitals reviewed. Constitutional: She is oriented to person, place, and time. She appears well-developed and well-nourished. She appears distressed.  HENT:  Head: Normocephalic and atraumatic.  Right Ear: External ear normal.  Left Ear: External ear normal.  Nose: Nose normal. No mucosal edema or rhinorrhea.  Mouth/Throat: Oropharynx is clear and  moist and mucous membranes are normal. No dental abscesses or uvula swelling.  Eyes: Conjunctivae and EOM are normal. Pupils are equal, round, and reactive to light.  Neck: Normal range of motion and full passive range of motion without pain. Neck supple.  Cardiovascular: Normal rate, regular rhythm and normal heart sounds.  Exam reveals no gallop and no friction rub.   No murmur heard. Pulmonary/Chest: Effort normal. No respiratory distress. She has wheezes (mild wheezing throughout). She has no rhonchi. She has no rales. She exhibits no tenderness and no crepitus.  Coughing frequently.   Abdominal: Soft. Normal appearance and bowel sounds are normal. She exhibits no distension. There is no tenderness. There is no rebound and no guarding.  Musculoskeletal: Normal range of motion. She exhibits no edema and no tenderness.  Moves all extremities well.   Neurological: She is alert and oriented to person, place, and time. She has normal strength. No cranial nerve deficit.  Skin: Skin is warm, dry and intact. No rash noted. No erythema. No pallor.  Psychiatric: She has a normal mood and affect. Her speech is normal and behavior is normal. Her mood appears not anxious.    ED Course  Procedures (including critical care time)  Medications  0.9 %  sodium chloride infusion ( Intravenous New Bag/Given 07/07/13 2155)  levofloxacin (LEVAQUIN) IVPB 500 mg (500 mg Intravenous New Bag/Given 07/07/13 2354)  albuterol (PROVENTIL) (5 MG/ML) 0.5% nebulizer solution 5 mg (5 mg Nebulization Given 07/07/13 2036)  ipratropium (ATROVENT) nebulizer solution 0.5 mg (0.5 mg Nebulization Given 07/07/13 2035)  albuterol (PROVENTIL, VENTOLIN) (5 MG/ML) 0.5% continuous inhalation solution (10 mg  Given 07/07/13 2108)  methylPREDNISolone sodium succinate (SOLU-MEDROL) 125 mg/2 mL injection 125 mg (125 mg Intravenous Given 07/07/13 2155)  acetaminophen (TYLENOL) tablet 650 mg (650 mg Oral Given 07/07/13 2220)  albuterol (PROVENTIL) (2.5  MG/3ML) 0.083% nebulizer solution 5 mg (5 mg Nebulization Given 07/07/13 2344)  ipratropium (ATROVENT) nebulizer solution 0.5 mg (0.5 mg Nebulization Given 07/07/13 2345)     DIAGNOSTIC STUDIES: Oxygen Saturation is 86% on room air, low by my interpretation.    COORDINATION OF CARE: 9:42 PM-Discussed treatment plan which includes breathing treatment, CXR, CBC, and CMP with pt at bedside and pt agreed to plan.    Recheck 23:30 pt still has diffuse coarse breath sounds and expiratory wheezing/rhonchi. She has had 10 mg of albuterol, will give another 5 mg then consider admission.   00 15 patient was ambulated by nursing staff after her third nebulizer. Her baseline pulse ox was 80% and when she ambulated her pulse ox dropped to 78% on room air. Patient is not on oxygen at home. She will require admission.  00:43 Dr Maryland Pink, admit to [facility]  Labs Review Results for orders placed during the hospital encounter of 07/07/13  CBC WITH DIFFERENTIAL      Result Value Range   WBC 13.0 (*) 4.0 - 10.5 K/uL   RBC 4.42  3.87 - 5.11 MIL/uL   Hemoglobin 15.1 (*) 12.0 - 15.0 g/dL   HCT 41.2  36.0 - 46.0 %   MCV 93.2  78.0 - 100.0 fL   MCH 34.2 (*) 26.0 - 34.0 pg   MCHC 36.7 (*) 30.0 - 36.0 g/dL   RDW 13.3  11.5 - 15.5 %   Platelets 186  150 - 400 K/uL   Neutrophils Relative % 63  43 - 77 %   Neutro Abs 8.1 (*) 1.7 - 7.7 K/uL   Lymphocytes Relative 24  12 - 46 %   Lymphs Abs 3.2  0.7 - 4.0 K/uL   Monocytes Relative 11  3 - 12 %   Monocytes Absolute 1.4 (*) 0.1 - 1.0 K/uL   Eosinophils Relative 2  0 - 5 %   Eosinophils Absolute 0.2  0.0 - 0.7 K/uL   Basophils Relative 0  0 - 1 %   Basophils Absolute 0.0  0.0 - 0.1 K/uL  COMPREHENSIVE METABOLIC PANEL      Result Value Range   Sodium 133 (*) 137 - 147 mEq/L   Potassium 3.5 (*) 3.7 - 5.3 mEq/L   Chloride 92 (*) 96 - 112 mEq/L   CO2 24  19 - 32 mEq/L   Glucose, Bld 105 (*) 70 - 99 mg/dL   BUN 16  6 - 23 mg/dL   Creatinine, Ser 0.85  0.50 -  1.10 mg/dL   Calcium 10.0  8.4 - 10.5 mg/dL   Total Protein 7.3  6.0 - 8.3 g/dL   Albumin 3.7  3.5 - 5.2 g/dL   AST 38 (*) 0 - 37 U/L   ALT 21  0 - 35 U/L   Alkaline Phosphatase 108  39 - 117 U/L   Total Bilirubin 0.7  0.3 - 1.2 mg/dL   GFR calc non Af Amer 74 (*) >90 mL/min   GFR calc Af Amer 86 (*) >90 mL/min   Laboratory interpretation all normal except mild hypokalemia, mild hypo-natremia, leukocytosis    Imaging Review Dg Chest 2 View  07/07/2013   CLINICAL DATA:  Cough.  COPD.  EXAM: CHEST  2 VIEW  COMPARISON:  None.  FINDINGS: Heart size is normal. Chronic prominence of pulmonary interstitial lung markings again demonstrated. No evidence of acute or superimposed infiltrate. No evidence of pleural effusion. No mass or lymphadenopathy identified.  IMPRESSION: Stable chronic interstitial prominence.  No acute findings.   Electronically Signed   By: Earle Gell M.D.   On: 07/07/2013 18:43    EKG Interpretation   None       MDM   1. COPD with exacerbation   2. Bronchitis   3. Hypoxia    Plan admission  I personally performed the services described in this documentation, which was scribed in my presence. The recorded information has been reviewed and considered.  Rolland Porter, MD, FACEP    Janice Norrie, MD 07/08/13 [phone]

## 2013-07-07 NOTE — ED Notes (Signed)
Pt c/o productive cough-green sputum, congestion, weakness, ha x 4 days.

## 2013-07-07 NOTE — ED Notes (Signed)
Pt's O2 at 86% room air. Placed on 2L via nasal cannula, continuous pulse ox.

## 2013-07-08 ENCOUNTER — Encounter (HOSPITAL_COMMUNITY): Payer: Self-pay | Admitting: Internal Medicine

## 2013-07-08 DIAGNOSIS — F17200 Nicotine dependence, unspecified, uncomplicated: Secondary | ICD-10-CM

## 2013-07-08 DIAGNOSIS — F319 Bipolar disorder, unspecified: Secondary | ICD-10-CM

## 2013-07-08 DIAGNOSIS — J9600 Acute respiratory failure, unspecified whether with hypoxia or hypercapnia: Secondary | ICD-10-CM

## 2013-07-08 DIAGNOSIS — J9601 Acute respiratory failure with hypoxia: Secondary | ICD-10-CM | POA: Diagnosis present

## 2013-07-08 DIAGNOSIS — J441 Chronic obstructive pulmonary disease with (acute) exacerbation: Secondary | ICD-10-CM | POA: Diagnosis present

## 2013-07-08 MED ORDER — ACETAMINOPHEN 325 MG PO TABS
650.0000 mg | ORAL_TABLET | Freq: Four times a day (QID) | ORAL | Status: DC | PRN
  Administered 2013-07-08 – 2013-07-11 (×5): 650 mg via ORAL
  Filled 2013-07-08 (×5): qty 2

## 2013-07-08 MED ORDER — ALBUTEROL SULFATE (2.5 MG/3ML) 0.083% IN NEBU
2.5000 mg | INHALATION_SOLUTION | RESPIRATORY_TRACT | Status: DC
  Administered 2013-07-08 (×2): 2.5 mg via RESPIRATORY_TRACT
  Filled 2013-07-08 (×2): qty 3

## 2013-07-08 MED ORDER — METHYLPREDNISOLONE SODIUM SUCC 125 MG IJ SOLR
60.0000 mg | Freq: Four times a day (QID) | INTRAMUSCULAR | Status: DC
  Administered 2013-07-08 – 2013-07-09 (×6): 60 mg via INTRAVENOUS
  Filled 2013-07-08 (×6): qty 2

## 2013-07-08 MED ORDER — ONDANSETRON HCL 4 MG/2ML IJ SOLN
4.0000 mg | Freq: Four times a day (QID) | INTRAMUSCULAR | Status: DC | PRN
  Administered 2013-07-09 – 2013-07-10 (×2): 4 mg via INTRAVENOUS
  Filled 2013-07-08 (×2): qty 2

## 2013-07-08 MED ORDER — ALUM & MAG HYDROXIDE-SIMETH 200-200-20 MG/5ML PO SUSP
30.0000 mL | Freq: Four times a day (QID) | ORAL | Status: DC | PRN
  Administered 2013-07-08 – 2013-07-12 (×5): 30 mL via ORAL
  Filled 2013-07-08 (×5): qty 30

## 2013-07-08 MED ORDER — GUAIFENESIN ER 600 MG PO TB12
ORAL_TABLET | ORAL | Status: AC
  Filled 2013-07-08: qty 1

## 2013-07-08 MED ORDER — IPRATROPIUM BROMIDE 0.02 % IN SOLN
0.5000 mg | RESPIRATORY_TRACT | Status: DC
  Administered 2013-07-08 (×2): 0.5 mg via RESPIRATORY_TRACT
  Filled 2013-07-08 (×2): qty 2.5

## 2013-07-08 MED ORDER — LEVOFLOXACIN IN D5W 500 MG/100ML IV SOLN
500.0000 mg | INTRAVENOUS | Status: DC
  Administered 2013-07-08: 500 mg via INTRAVENOUS
  Filled 2013-07-08 (×2): qty 100

## 2013-07-08 MED ORDER — ONDANSETRON HCL 4 MG PO TABS
4.0000 mg | ORAL_TABLET | Freq: Four times a day (QID) | ORAL | Status: DC | PRN
  Administered 2013-07-08: 4 mg via ORAL
  Filled 2013-07-08: qty 1

## 2013-07-08 MED ORDER — SODIUM CHLORIDE 0.9 % IV SOLN
INTRAVENOUS | Status: DC
  Administered 2013-07-08: 02:00:00 via INTRAVENOUS

## 2013-07-08 MED ORDER — GUAIFENESIN-DM 100-10 MG/5ML PO SYRP
5.0000 mL | ORAL_SOLUTION | ORAL | Status: DC | PRN
  Administered 2013-07-09 – 2013-07-11 (×4): 5 mL via ORAL
  Filled 2013-07-08 (×4): qty 5

## 2013-07-08 MED ORDER — POTASSIUM CHLORIDE CRYS ER 20 MEQ PO TBCR: 40.0000 meq | EXTENDED_RELEASE_TABLET | ORAL | Status: DC | PRN

## 2013-07-08 MED ORDER — BIOTENE DRY MOUTH MT LIQD
15.0000 mL | Freq: Two times a day (BID) | OROMUCOSAL | Status: DC
  Administered 2013-07-08 – 2013-07-12 (×8): 15 mL via OROMUCOSAL

## 2013-07-08 MED ORDER — NICOTINE 14 MG/24HR TD PT24
14.0000 mg | MEDICATED_PATCH | Freq: Every day | TRANSDERMAL | Status: DC
  Administered 2013-07-08 – 2013-07-12 (×5): 14 mg via TRANSDERMAL
  Filled 2013-07-08 (×4): qty 1

## 2013-07-08 MED ORDER — NICOTINE 14 MG/24HR TD PT24
MEDICATED_PATCH | TRANSDERMAL | Status: AC
  Filled 2013-07-08: qty 1

## 2013-07-08 MED ORDER — ENOXAPARIN SODIUM 40 MG/0.4ML ~~LOC~~ SOLN
40.0000 mg | SUBCUTANEOUS | Status: DC
  Administered 2013-07-08 – 2013-07-12 (×4): 40 mg via SUBCUTANEOUS
  Filled 2013-07-08 (×5): qty 0.4

## 2013-07-08 MED ORDER — ACETAMINOPHEN 650 MG RE SUPP: 650.0000 mg | Freq: Four times a day (QID) | RECTAL | Status: DC | PRN

## 2013-07-08 MED ORDER — POTASSIUM CHLORIDE CRYS ER 20 MEQ PO TBCR
40.0000 meq | EXTENDED_RELEASE_TABLET | Freq: Once | ORAL | Status: AC
  Administered 2013-07-08: 40 meq via ORAL
  Filled 2013-07-08: qty 2

## 2013-07-08 MED ORDER — MOMETASONE FURO-FORMOTEROL FUM 100-5 MCG/ACT IN AERO
2.0000 | INHALATION_SPRAY | Freq: Two times a day (BID) | RESPIRATORY_TRACT | Status: DC
  Administered 2013-07-08 – 2013-07-12 (×8): 2 via RESPIRATORY_TRACT
  Filled 2013-07-08 (×3): qty 8.8

## 2013-07-08 MED ORDER — ALBUTEROL SULFATE (2.5 MG/3ML) 0.083% IN NEBU: 2.5000 mg | INHALATION_SOLUTION | RESPIRATORY_TRACT | Status: DC | PRN

## 2013-07-08 MED ORDER — IPRATROPIUM-ALBUTEROL 0.5-2.5 (3) MG/3ML IN SOLN
3.0000 mL | RESPIRATORY_TRACT | Status: DC
  Administered 2013-07-08 – 2013-07-11 (×20): 3 mL via RESPIRATORY_TRACT
  Filled 2013-07-08 (×22): qty 3

## 2013-07-08 MED ORDER — GUAIFENESIN ER 600 MG PO TB12
600.0000 mg | ORAL_TABLET | Freq: Two times a day (BID) | ORAL | Status: DC
  Administered 2013-07-08 – 2013-07-11 (×8): 600 mg via ORAL
  Filled 2013-07-08 (×13): qty 1

## 2013-07-08 MED ORDER — ALPRAZOLAM 0.5 MG PO TABS
0.5000 mg | ORAL_TABLET | Freq: Three times a day (TID) | ORAL | Status: DC | PRN
  Administered 2013-07-08 – 2013-07-12 (×12): 0.5 mg via ORAL
  Filled 2013-07-08 (×12): qty 1

## 2013-07-08 MED ORDER — LEVOFLOXACIN IN D5W 500 MG/100ML IV SOLN
INTRAVENOUS | Status: AC
  Filled 2013-07-08: qty 100

## 2013-07-08 NOTE — H&P (Signed)
[facility] History and Physical  [name] MWN:[id number] DOB: July 16, 1955 DOA: 07/07/2013   PCP: Glo Herring., MD  Specialists: None  Chief Complaint: Cough, shortness of breath for the last 5 days  HPI: [name] is a 58 y.o. female with a past medical history of COPD, bipolar disorder, who was in her usual state of health till about 5 days ago, when she started having a cough, along with wheezing and shortness of breath. The symptoms got worse and so, she decided to come into the hospital. She has been coughing up a greenish yellow expectoration. Denies any blood in the sputum. She's been wheezing quite profusely as well. She's had generalized weakness. However, she denies any fever. Denies any sick contacts. No nausea, vomiting. Denies any leg swelling. She did get a flu shot this year. Has had some dizziness, but denies any syncope. She tried taking her inhalers and nebulizer treatments at home without any relief.  Home Medications: Prior to Admission medications   Medication Sig Start Date End Date Taking? Authorizing Provider  albuterol (PROVENTIL HFA;VENTOLIN HFA) 108 (90 BASE) MCG/ACT inhaler Inhale 2 puffs into the lungs every 6 (six) hours as needed for wheezing or shortness of breath.   Yes Historical Provider, MD  albuterol (PROVENTIL) (2.5 MG/3ML) 0.083% nebulizer solution Take 2.5 mg by nebulization every 6 (six) hours as needed for wheezing or shortness of breath.   Yes Historical Provider, MD  albuterol-ipratropium (COMBIVENT) 18-103 MCG/ACT inhaler Inhale 2 puffs into the lungs every 6 (six) hours as needed for shortness of breath.     Historical Provider, MD  Fluticasone-Salmeterol (ADVAIR) 250-50 MCG/DOSE AEPB Inhale 1 puff into the lungs every 12 (twelve) hours.    Historical Provider, MD    Allergies:  Allergies  Allergen Reactions  . Penicillins Other (See Comments)    REACTION: Unknown  . Septra [Sulfamethoxazole-Trimethoprim] Other  (See Comments)    REACTION: Unknown    Past Medical History: Past Medical History  Diagnosis Date  . COPD (chronic obstructive pulmonary disease)     Past Surgical History  Procedure Laterality Date  . Kidney stone surgery    . Cholecystectomy    . Hernia repair      Social History: She lives with her adopted mother. Continue to smoke up to half pack of cigarettes on a daily basis. Occasional alcohol use. No illicit drug use. She's independent with daily activities.  Family History:  Family History  Problem Relation Age of Onset  . COPD Mother      Review of Systems - History obtained from the patient General ROS: positive for  - fatigue Psychological ROS: negative Ophthalmic ROS: negative ENT ROS: negative Allergy and Immunology ROS: negative Hematological and Lymphatic ROS: negative Endocrine ROS: negative Respiratory ROS: as in hpi Cardiovascular ROS: no chest pain or dyspnea on exertion Gastrointestinal ROS: no abdominal pain, change in bowel habits, or black or bloody stools Genito-Urinary ROS: no dysuria, trouble voiding, or hematuria Musculoskeletal ROS: negative Neurological ROS: no TIA or stroke symptoms Dermatological ROS: negative  Physical Examination  Filed Vitals:   07/07/13 2247 07/07/13 2310 07/07/13 2346 07/08/13 0019  BP:    136/59  Pulse: 114 114  109  Temp:    98.7 F (37.1 C)  TempSrc:    Oral  Resp:    28  Height:      Weight:      SpO2: 90% 92% 88% 94%    General appearance: alert, cooperative, appears stated age  and no distress Head: Normocephalic, without obvious abnormality, atraumatic Eyes: conjunctivae/corneas clear. PERRL, EOM's intact. Throat: lips, mucosa, and tongue normal; teeth and gums normal Neck: no adenopathy, no carotid bruit, no JVD, supple, symmetrical, trachea midline and thyroid not enlarged, symmetric, no tenderness/mass/nodules Back: symmetric, no curvature. ROM normal. No CVA tenderness. Resp: Diffuse wheezing  with few rhonchi bilateral lung fields. Diminished air entry at the bases. No definite crackles. Cardio: regular rate and rhythm, S1, S2 normal, no murmur, click, rub or gallop GI: soft, non-tender; bowel sounds normal; no masses,  no organomegaly Extremities: extremities normal, atraumatic, no cyanosis or edema Pulses: 2+ and symmetric Neurologic: She is alert and oriented x3. No focal neurological deficits are present.  Laboratory Data: Results for orders placed during the hospital encounter of 07/07/13 (from the past 48 hour(s))  CBC WITH DIFFERENTIAL     Status: Abnormal   Collection Time    07/07/13 10:00 PM      Result Value Range   WBC 13.0 (*) 4.0 - 10.5 K/uL   RBC 4.42  3.87 - 5.11 MIL/uL   Hemoglobin 15.1 (*) 12.0 - 15.0 g/dL   HCT 41.2  36.0 - 46.0 %   MCV 93.2  78.0 - 100.0 fL   MCH 34.2 (*) 26.0 - 34.0 pg   MCHC 36.7 (*) 30.0 - 36.0 g/dL   RDW 13.3  11.5 - 15.5 %   Platelets 186  150 - 400 K/uL   Neutrophils Relative % 63  43 - 77 %   Neutro Abs 8.1 (*) 1.7 - 7.7 K/uL   Lymphocytes Relative 24  12 - 46 %   Lymphs Abs 3.2  0.7 - 4.0 K/uL   Monocytes Relative 11  3 - 12 %   Monocytes Absolute 1.4 (*) 0.1 - 1.0 K/uL   Eosinophils Relative 2  0 - 5 %   Eosinophils Absolute 0.2  0.0 - 0.7 K/uL   Basophils Relative 0  0 - 1 %   Basophils Absolute 0.0  0.0 - 0.1 K/uL  COMPREHENSIVE METABOLIC PANEL     Status: Abnormal   Collection Time    07/07/13 10:00 PM      Result Value Range   Sodium 133 (*) 137 - 147 mEq/L   Comment: Please note change in reference range.   Potassium 3.5 (*) 3.7 - 5.3 mEq/L   Comment: Please note change in reference range.   Chloride 92 (*) 96 - 112 mEq/L   CO2 24  19 - 32 mEq/L   Glucose, Bld 105 (*) 70 - 99 mg/dL   BUN 16  6 - 23 mg/dL   Creatinine, Ser 0.85  0.50 - 1.10 mg/dL   Calcium 10.0  8.4 - 10.5 mg/dL   Total Protein 7.3  6.0 - 8.3 g/dL   Albumin 3.7  3.5 - 5.2 g/dL   AST 38 (*) 0 - 37 U/L   ALT 21  0 - 35 U/L   Alkaline  Phosphatase 108  39 - 117 U/L   Total Bilirubin 0.7  0.3 - 1.2 mg/dL   GFR calc non Af Amer 74 (*) >90 mL/min   GFR calc Af Amer 86 (*) >90 mL/min   Comment: (NOTE)     The eGFR has been calculated using the CKD EPI equation.     This calculation has not been validated in all clinical situations.     eGFR's persistently <90 mL/min signify possible Chronic Kidney     Disease.  Radiology Reports: Dg Chest 2 View  07/07/2013   CLINICAL DATA:  Cough.  COPD.  EXAM: CHEST  2 VIEW  COMPARISON:  None.  FINDINGS: Heart size is normal. Chronic prominence of pulmonary interstitial lung markings again demonstrated. No evidence of acute or superimposed infiltrate. No evidence of pleural effusion. No mass or lymphadenopathy identified.  IMPRESSION: Stable chronic interstitial prominence.  No acute findings.   Electronically Signed   By: Earle Gell M.D.   On: 07/07/2013 18:43    Problem List  Principal Problem:   Acute exacerbation of chronic obstructive pulmonary disease (COPD) Active Problems:   COPD (chronic obstructive pulmonary disease)   Bipolar disorder   Tobacco use disorder   GERD (gastroesophageal reflux disease)   Acute respiratory failure with hypoxia   Assessment: This is a 58 year old, Caucasian female, who presents with wheezing, shortness of breath, cough for the last 5 days and appears to have an acute exacerbation of COPD. Chest x-ray does not show any infiltrates. She is also mildly dehydrated and is hypokalemic. She was hypoxic when she presented to the ED.  Plan: #1 acute COPD exacerbation with acute respiratory failure with hypoxia: She'll be treated with nebulizer treatments, steroids, antibiotics.  Oxygenation has improved with nasal oxygen. This will be continued.  #2 tobacco abuse: Nicotine patch will be prescribed. She's been counseled extensively regarding smoking cessation. She is interested in quitting. I have asked her to discuss this issue with her PCP. Considering  her psychiatric history Chantix may not be an appropriate option for her.   #3 dehydration and hypokalemia: She'll be given IV fluids. Potassium will be repleted.  #4 history of bipolar disorder, and anxiety: Doesn't appear to be on any maintenance treatment. We will give her Xanax as needed.  DVT Prophylaxis: Lovenox Code Status: Full code Family Communication: Discussed with the patient  Disposition Plan: Admit to the hospital   Further management decisions will depend on results of further testing and patient's response to treatment.  [facility]  [facility] Pager [phone]  If 7PM-7AM, please contact night-coverage [email or web address] Password [email or web address]  07/08/2013, 1:23 AM

## 2013-07-08 NOTE — ED Notes (Signed)
Pt given lunch tray, update given on plan of care,

## 2013-07-08 NOTE — ED Notes (Signed)
Dr. Krishnan at bedside. 

## 2013-07-08 NOTE — ED Notes (Signed)
Patient unable to contact grandson's mother at present, waiting return phone call from her brother regarding transportation home for grandson.

## 2013-07-08 NOTE — ED Notes (Signed)
Report given to [facility] on 300

## 2013-07-08 NOTE — ED Notes (Signed)
Patient rang call bell to go to the bathroom.  Patient is ambulatory and when I arrived in the room she stated she needed a full linen change and new scrubs because when she coughed she peed in the bed.  Patient's linen was changed and new paper scrubs were given to the patient.

## 2013-07-08 NOTE — ED Notes (Signed)
Discussed admission status with pt. Pt has grandson with her at present and states she has no one to watch him until the morning when his mother returns from out of town roughly between 10-11am. Discussed situation with [facility], charge nurse and pt, came to agreement to continue care of patient in ED while allowing grandson to continue to be at the bedside until the morning when other arrangements can be made. All parties in agreement of this plan.

## 2013-07-08 NOTE — ED Notes (Addendum)
Pt able to contact her son in law who advised that he would be here in a few minutes to pick up the child with the pt, received report on pt, pt states that she is not feeling much better, comfort measures provided,

## 2013-07-08 NOTE — ED Notes (Signed)
Pt ambulated to restroom and up in room.no distress.  RT at bedside for morning medication. nad noted.

## 2013-07-08 NOTE — ED Notes (Signed)
Pt was able to find a ride for her grandson. Pt updated on plan of care and delay, requesting xanax for anxiety, sob.

## 2013-07-08 NOTE — Progress Notes (Signed)
[facility]                                                                                Patient Demographics  [name], is a 58 y.o. female, DOB - 1955-07-24, FTD:[id number]  Admit date - 07/07/2013   Admitting Physician No admitting provider for patient encounter.  Outpatient Primary MD for the patient is [name]., MD  LOS - 1   Chief Complaint  Patient presents with  . Cough        Assessment & Plan    #1 Acute COPD exacerbation with acute respiratory failure with hypoxia: She is showing improvement with nebulizer treatments, steroids, antibiotics. Oxygenation has improved with nasal oxygen. This will be continued. Counseled to quit smoking.    #2 Tobacco abuse: Nicotine patch will be prescribed. She's been counseled extensively regarding smoking cessation. She is interested in quitting. I have asked her to discuss this issue with her PCP. Considering her psychiatric history Chantix may not be an appropriate option for her.     #3 Dehydration and hypokalemia: Improved after hydration and potassium replacement.    #4 History of bipolar disorder, and anxiety: Doesn't appear to be on any maintenance treatment. We will give her Xanax as needed.      Code Status: full  Family Communication:    Disposition Plan: Home   Procedures     Consults      Medications  Scheduled Meds: . albuterol  2.5 mg Nebulization Q4H  . enoxaparin (LOVENOX) injection  40 mg Subcutaneous Q24H  . guaiFENesin  600 mg Oral BID  . ipratropium  0.5 mg Nebulization Q4H  . methylPREDNISolone (SOLU-MEDROL) injection  60 mg Intravenous Q6H  . mometasone-formoterol  2 puff Inhalation BID  . nicotine  14 mg Transdermal Daily   Continuous Infusions:  PRN Meds:.ALPRAZolam  DVT Prophylaxis  Lovenox    Lab Results  Component Value Date   PLT 186 07/07/2013    Antibiotics    Anti-infectives   Start     Dose/Rate Route Frequency Ordered Stop   07/07/13 2330   levofloxacin (LEVAQUIN) IVPB 500 mg     500 mg 100 mL/hr over 60 Minutes Intravenous  Once 07/07/13 2323 07/08/13 0120          Subjective:   [name] today has, No headache, No chest pain, No abdominal pain - No Nausea, No new weakness tingling or numbness, improved Cough - SOB.    Objective:   Filed Vitals:   07/08/13 0400 07/08/13 0500 07/08/13 0613 07/08/13 0740  BP:   129/69   Pulse: 90 95 91   Temp:   98 F (36.7 C)   TempSrc:   Oral   Resp:   30   Height:      Weight:      SpO2: 93% 95% 91% 93%    Wt Readings from Last 3 Encounters:  07/07/13 83.553 kg (184 lb 3.2 oz)  05/05/13 77.111 kg (170 lb)  08/25/12 80.74 kg (178 lb)    No intake or output data in the 24 hours ending 07/08/13 0940  Exam Awake Alert, Oriented X 3, No new F.N deficits, Normal affect [name].AT,PERRAL  Supple Neck,No JVD, No cervical lymphadenopathy appriciated.  Symmetrical Chest wall movement, Good air movement bilaterally, ++ wheezing bilat RRR,No Gallops,Rubs or new Murmurs, No Parasternal Heave +ve B.Sounds, Abd Soft, Non tender, No organomegaly appriciated, No rebound - guarding or rigidity. No Cyanosis, Clubbing or edema, No new Rash or bruise      Data Review   Micro Results No results found for this or any previous visit (from the past 240 hour(s)).  Radiology Reports Dg Chest 2 View  07/07/2013   CLINICAL DATA:  Cough.  COPD.  EXAM: CHEST  2 VIEW  COMPARISON:  None.  FINDINGS: Heart size is normal. Chronic prominence of pulmonary interstitial lung markings again demonstrated. No evidence of acute or superimposed infiltrate. No evidence of pleural effusion. No mass or lymphadenopathy identified.  IMPRESSION: Stable chronic interstitial prominence.  No acute findings.   Electronically Signed   By: Earle Gell M.D.   On: 07/07/2013 18:43    CBC  Recent Labs Lab 07/07/13 2200  WBC 13.0*  HGB 15.1*  HCT 41.2  PLT 186  MCV 93.2  MCH 34.2*  MCHC 36.7*  RDW 13.3   LYMPHSABS 3.2  MONOABS 1.4*  EOSABS 0.2  BASOSABS 0.0    Chemistries   Recent Labs Lab 07/07/13 2200  NA 133*  K 3.5*  CL 92*  CO2 24  GLUCOSE 105*  BUN 16  CREATININE 0.85  CALCIUM 10.0  AST 38*  ALT 21  ALKPHOS 108  BILITOT 0.7   ------------------------------------------------------------------------------------------------------------------ estimated creatinine clearance is 75.5 ml/min (by C-G formula based on Cr of 0.85). ------------------------------------------------------------------------------------------------------------------ No results found for this basename: HGBA1C,  in the last 72 hours ------------------------------------------------------------------------------------------------------------------ No results found for this basename: CHOL, HDL, LDLCALC, TRIG, CHOLHDL, LDLDIRECT,  in the last 72 hours ------------------------------------------------------------------------------------------------------------------ No results found for this basename: TSH, T4TOTAL, FREET3, T3FREE, THYROIDAB,  in the last 72 hours ------------------------------------------------------------------------------------------------------------------ No results found for this basename: VITAMINB12, FOLATE, FERRITIN, TIBC, IRON, RETICCTPCT,  in the last 72 hours  Coagulation profile No results found for this basename: INR, PROTIME,  in the last 168 hours  No results found for this basename: DDIMER,  in the last 72 hours  Cardiac Enzymes No results found for this basename: CK, CKMB, TROPONINI, MYOGLOBIN,  in the last 168 hours ------------------------------------------------------------------------------------------------------------------ No components found with this basename: POCBNP,      Time Spent in minutes 35   Jerrine Urschel K M.D on 07/08/2013 at 9:40 AM  Between 7am to 7pm - Pager - [phone]  After 7pm go to [email or web address] - password [email or web address]  And look for the  night coverage person covering for me after hours  [facility] Office  [phone]

## 2013-07-08 NOTE — ED Notes (Signed)
Request Mucinex from [facility].

## 2013-07-09 ENCOUNTER — Inpatient Hospital Stay (HOSPITAL_COMMUNITY): Payer: Medicaid Other

## 2013-07-09 LAB — URINALYSIS W MICROSCOPIC + REFLEX CULTURE
BILIRUBIN URINE: NEGATIVE
Glucose, UA: >1000 mg/dL — AB
HGB URINE DIPSTICK: NEGATIVE
KETONES UR: NEGATIVE mg/dL
Leukocytes, UA: NEGATIVE
NITRITE: NEGATIVE
PH: 5.5 (ref 5.0–8.0)
Protein, ur: NEGATIVE mg/dL
Specific Gravity, Urine: 1.025 (ref 1.005–1.030)
Urobilinogen, UA: 0.2 mg/dL (ref 0.0–1.0)

## 2013-07-09 LAB — COMPREHENSIVE METABOLIC PANEL
ALK PHOS: 132 U/L — AB (ref 39–117)
ALT: 19 U/L (ref 0–35)
AST: 22 U/L (ref 0–37)
Albumin: 3 g/dL — ABNORMAL LOW (ref 3.5–5.2)
BUN: 19 mg/dL (ref 6–23)
CO2: 25 meq/L (ref 19–32)
Calcium: 10.7 mg/dL — ABNORMAL HIGH (ref 8.4–10.5)
Chloride: 99 mEq/L (ref 96–112)
Creatinine, Ser: 0.76 mg/dL (ref 0.50–1.10)
GFR calc non Af Amer: >90 mL/min (ref >90)
GLUCOSE: 214 mg/dL — AB (ref 70–99)
POTASSIUM: 4.1 meq/L (ref 3.7–5.3)
SODIUM: 136 meq/L — AB (ref 137–147)
TOTAL PROTEIN: 7.2 g/dL (ref 6.0–8.3)
Total Bilirubin: 0.2 mg/dL — ABNORMAL LOW (ref 0.3–1.2)

## 2013-07-09 LAB — CBC
HCT: 39.4 % (ref 36.0–46.0)
HEMOGLOBIN: 14.1 g/dL (ref 12.0–15.0)
MCH: 34.2 pg — ABNORMAL HIGH (ref 26.0–34.0)
MCHC: 35.8 g/dL (ref 30.0–36.0)
MCV: 95.6 fL (ref 78.0–100.0)
PLATELETS: 219 10*3/uL (ref 150–400)
RBC: 4.12 MIL/uL (ref 3.87–5.11)
RDW: 13.6 % (ref 11.5–15.5)
WBC: 24.2 10*3/uL — ABNORMAL HIGH (ref 4.0–10.5)

## 2013-07-09 LAB — GLUCOSE, CAPILLARY
Glucose-Capillary: 339 mg/dL — ABNORMAL HIGH (ref 70–99)
Glucose-Capillary: 322 mg/dL — ABNORMAL HIGH (ref 70–99)

## 2013-07-09 MED ORDER — AMLODIPINE BESYLATE 5 MG PO TABS
10.0000 mg | ORAL_TABLET | Freq: Every day | ORAL | Status: DC
  Administered 2013-07-09 – 2013-07-12 (×4): 10 mg via ORAL
  Filled 2013-07-09 (×4): qty 2

## 2013-07-09 MED ORDER — METHYLPREDNISOLONE SODIUM SUCC 125 MG IJ SOLR
60.0000 mg | Freq: Two times a day (BID) | INTRAMUSCULAR | Status: DC
  Administered 2013-07-09 – 2013-07-12 (×6): 60 mg via INTRAVENOUS
  Filled 2013-07-09 (×6): qty 2

## 2013-07-09 MED ORDER — AZITHROMYCIN 250 MG PO TABS
500.0000 mg | ORAL_TABLET | ORAL | Status: DC
  Administered 2013-07-09 – 2013-07-12 (×4): 500 mg via ORAL
  Filled 2013-07-09 (×4): qty 2

## 2013-07-09 MED ORDER — DEXTROSE 5 % IV SOLN
1.0000 g | INTRAVENOUS | Status: DC
  Administered 2013-07-09 – 2013-07-12 (×4): 1 g via INTRAVENOUS
  Filled 2013-07-09 (×5): qty 10

## 2013-07-09 MED ORDER — INSULIN ASPART 100 UNIT/ML ~~LOC~~ SOLN
0.0000 [IU] | Freq: Three times a day (TID) | SUBCUTANEOUS | Status: DC
  Administered 2013-07-09: 7 [IU] via SUBCUTANEOUS
  Administered 2013-07-10 (×2): 5 [IU] via SUBCUTANEOUS
  Administered 2013-07-10: 9 [IU] via SUBCUTANEOUS
  Administered 2013-07-11: 5 [IU] via SUBCUTANEOUS
  Administered 2013-07-11: 1 [IU] via SUBCUTANEOUS
  Administered 2013-07-11: 7 [IU] via SUBCUTANEOUS
  Administered 2013-07-12: 2 [IU] via SUBCUTANEOUS
  Administered 2013-07-12: 3 [IU] via SUBCUTANEOUS

## 2013-07-09 NOTE — Progress Notes (Signed)
Present with patient for discussion of Advance Directives.  Patient had requested the information.  She asked if I could return later to complete the forms, stating she had not slept well and didn't feel well enough at this point. Will follow up for this.

## 2013-07-09 NOTE — Progress Notes (Signed)
Utilization Review Complete  

## 2013-07-09 NOTE — Clinical Social Work Note (Signed)
CSW received consult for advance directives. Chaplain aware and plans to see today. CSW will sign off, but can be reconsulted if needed.  Benay Pike, [place]

## 2013-07-09 NOTE — Progress Notes (Signed)
Inpatient Diabetes Program Recommendations  AACE/ADA: New Consensus Statement on Inpatient Glycemic Control (2013)  Target Ranges:  Prepandial:   less than 140 mg/dL      Peak postprandial:   less than 180 mg/dL (1-2 hours)      Critically ill patients:  140 - 180 mg/dL   Results for JACKI, COUSE (MRN [id number]) as of 07/09/2013 11:57  Ref. Range 07/09/2013 05:28  Glucose Latest Range: 70-99 mg/dL 214 (H)    Inpatient Diabetes Program Recommendations Correction (SSI): Please consider ordering CBGs with Novolog correction scale ACHS while inpatient and receiving steroids. HgbA1C: Please consider ordering an A1C to evaluate glycemic control over the past 2-3 months.  Note: Patient does not have a documented history of diabetes and initial lab glucose noted to be 105 mg/dl on 07/07/2013.  However, patient is ordered Solumedrol 60 mg Q12H and fasting lab glucose this morning was 214 mg/dl.  While inpatient and on steroids, please consider ordering CBGS with Novolog correction scale and an A1C.  Will continue to follow.  Thanks, Barnie Alderman, RN, MSN, CCRN Diabetes Coordinator Inpatient Diabetes Program [phone] (Team Pager) [phone] ([facility] office) [phone] [facility] office)

## 2013-07-09 NOTE — Progress Notes (Signed)
ANTIBIOTIC CONSULT NOTE - INITIAL  Pharmacy Consult for Renal Adjustment of Antibiotics  Allergies  Allergen Reactions  . Penicillins Other (See Comments)    REACTION: Unknown  . Septra [Sulfamethoxazole-Trimethoprim] Other (See Comments)    REACTION: Unknown   Patient Measurements: Height: 5\' 4"  (162.6 cm) Weight: 184 lb (83.462 kg) IBW/kg (Calculated) : 54.7  Vital Signs: Temp: 97.9 F (36.6 C) (01/05 4128) BP: 138/70 mmHg (01/05 1245) Pulse Rate: 101 (01/05 0632) Intake/Output from previous day: 01/04 0701 - 01/05 0700 In: -  Out: 60  Intake/Output from this shift: Total I/O In: 240 [P.O.:240] Out: -   Labs:  Recent Labs  07/07/13 2200 07/09/13 0528  WBC 13.0* 24.2*  HGB 15.1* 14.1  PLT 186 219  CREATININE 0.85 0.76   Estimated Creatinine Clearance: 80.1 ml/min (by C-G formula based on Cr of 0.76). No results found for this basename: VANCOTROUGH, VANCOPEAK, VANCORANDOM, GENTTROUGH, GENTPEAK, GENTRANDOM, TOBRATROUGH, TOBRAPEAK, TOBRARND, AMIKACINPEAK, AMIKACINTROU, AMIKACIN,  in the last 72 hours   Microbiology: No results found for this or any previous visit (from the past 720 hour(s)).  Medical History: Past Medical History  Diagnosis Date  . COPD (chronic obstructive pulmonary disease)    Medications:  Scheduled:  . amLODipine  10 mg Oral Daily  . antiseptic oral rinse  15 mL Mouth Rinse BID  . azithromycin  500 mg Oral Q24H  . cefTRIAXone (ROCEPHIN)  IV  1 g Intravenous Q24H  . enoxaparin (LOVENOX) injection  40 mg Subcutaneous Q24H  . guaiFENesin  600 mg Oral BID  . insulin aspart  0-9 Units Subcutaneous TID WC  . ipratropium-albuterol  3 mL Nebulization Q4H  . methylPREDNISolone (SOLU-MEDROL) injection  60 mg Intravenous Q12H  . mometasone-formoterol  2 puff Inhalation BID  . nicotine  14 mg Transdermal Daily   Assessment: 58yo female admitted with COPD exacerbation.  Pt has good renal fxn.  Started on Zithromax and Rocephin (which do not  need renal adjustment)  Plan:  Continue Current Rx: Zithromax 500mg  PO q24h  1/5 >> Rocephin 1gm IV q24h  1/5 >>  Hart Robinsons A 07/09/2013,1:52 PM

## 2013-07-09 NOTE — Progress Notes (Addendum)
[facility]                                                                                Patient Demographics  [name], is a 58 y.o. female, DOB - 03-12-1956, AOZ:[id number]  Admit date - 07/07/2013   Admitting Physician Bonnielee Haff, MD  Outpatient Primary MD for the patient is Glo Herring., MD  LOS - 2   Chief Complaint  Patient presents with  . Cough        Assessment & Plan     #1 Acute COPD exacerbation with acute respiratory failure with hypoxia repeat chest x-ray suggestive of CAP: She was feeling worse with chest steroids and Levaquin, chest x-ray was repeated and is now suggestive of a new infiltrate on the left side likely community-acquired pneumonia with evolving chest x-ray, will switch her to azithromycin and Rocephin as she's not feeling better only with Levaquin, will check sputum Gram stain culture along with Legionella antigen and strep pneumonia patient. Continue with nebulizer treatments, steroids. Oxygenation has improved with nasal oxygen. Counseled to quit smoking.     #2 Tobacco abuse: Nicotine patch will be prescribed. She's been counseled extensively regarding smoking cessation. She is interested in quitting. I have asked her to discuss this issue with her PCP. Considering her psychiatric history Chantix may not be an appropriate option for her.      #3 Dehydration and hypokalemia: Improved after hydration and potassium replacement.     #4 History of bipolar disorder, and anxiety: Doesn't appear to be on any maintenance treatment. We will give her Xanax as needed.     #5. Leukocytosis likely due to combination of Along with steroids. Monitor.     #6. Steroid-induced hyperglycemia. Place on sliding scale insulin and monitor.  CBG (last 3)  No results found for this basename: GLUCAP,  in the last 72 hours    #7. Hypertension. Added Norvasc, monitor.   Code Status: full  Family Communication:    Disposition  Plan: Home   Procedures     Consults      Medications  Scheduled Meds: . antiseptic oral rinse  15 mL Mouth Rinse BID  . azithromycin  500 mg Oral Q24H  . cefTRIAXone (ROCEPHIN)  IV  1 g Intravenous Q24H  . enoxaparin (LOVENOX) injection  40 mg Subcutaneous Q24H  . guaiFENesin  600 mg Oral BID  . insulin aspart  0-9 Units Subcutaneous TID WC  . ipratropium-albuterol  3 mL Nebulization Q4H  . methylPREDNISolone (SOLU-MEDROL) injection  60 mg Intravenous Q12H  . mometasone-formoterol  2 puff Inhalation BID  . nicotine  14 mg Transdermal Daily   Continuous Infusions:  PRN Meds:.acetaminophen, acetaminophen, albuterol, ALPRAZolam, alum & mag hydroxide-simeth, guaiFENesin-dextromethorphan, ondansetron (ZOFRAN) IV, ondansetron  DVT Prophylaxis  Lovenox    Lab Results  Component Value Date   PLT 219 07/09/2013    Antibiotics    Anti-infectives   Start     Dose/Rate Route Frequency Ordered Stop   07/09/13 1215  cefTRIAXone (ROCEPHIN) 1 g in dextrose 5 % 50 mL IVPB     1 g 100 mL/hr over 30 Minutes Intravenous Every 24 hours 07/09/13 1208 07/16/13 1214  07/09/13 1215  azithromycin (ZITHROMAX) tablet 500 mg     500 mg Oral Every 24 hours 07/09/13 1208 07/16/13 1214   07/08/13 2300  levofloxacin (LEVAQUIN) IVPB 500 mg  Status:  Discontinued     500 mg 100 mL/hr over 60 Minutes Intravenous Every 24 hours 07/08/13 1308 07/09/13 1208   07/07/13 2330  levofloxacin (LEVAQUIN) IVPB 500 mg     500 mg 100 mL/hr over 60 Minutes Intravenous  Once 07/07/13 2323 07/08/13 0120          Subjective:   [name] today has, No headache, No chest pain, No abdominal pain - No Nausea, No new weakness tingling or numbness, improved Cough - SOB.    Objective:   Filed Vitals:   07/08/13 2329 07/09/13 0344 07/09/13 0632 07/09/13 0728  BP:   118/101   Pulse:   101   Temp:   97.9 F (36.6 C)   TempSrc:      Resp:   22   Height:      Weight:      SpO2: 94% 94% 96% 92%     Wt Readings from Last 3 Encounters:  07/08/13 83.462 kg (184 lb)  05/05/13 77.111 kg (170 lb)  08/25/12 80.74 kg (178 lb)     Intake/Output Summary (Last 24 hours) at 07/09/13 1209 Last data filed at 07/08/13 1326  Gross per 24 hour  Intake      0 ml  Output     60 ml  Net    -60 ml    Exam Awake Alert, Oriented X 3, No new F.N deficits, Normal affect [place].AT,PERRAL Supple Neck,No JVD, No cervical lymphadenopathy appriciated.  Symmetrical Chest wall movement, Good air movement bilaterally, ++ wheezing bilat RRR,No Gallops,Rubs or new Murmurs, No Parasternal Heave +ve B.Sounds, Abd Soft, Non tender, No organomegaly appriciated, No rebound - guarding or rigidity. No Cyanosis, Clubbing or edema, No new Rash or bruise      Data Review   Micro Results No results found for this or any previous visit (from the past 240 hour(s)).  Radiology Reports Dg Chest 2 View  07/07/2013   CLINICAL DATA:  Cough.  COPD.  EXAM: CHEST  2 VIEW  COMPARISON:  None.  FINDINGS: Heart size is normal. Chronic prominence of pulmonary interstitial lung markings again demonstrated. No evidence of acute or superimposed infiltrate. No evidence of pleural effusion. No mass or lymphadenopathy identified.  IMPRESSION: Stable chronic interstitial prominence.  No acute findings.   Electronically Signed   By: Earle Gell M.D.   On: 07/07/2013 18:43    CBC  Recent Labs Lab 07/07/13 2200 07/09/13 0528  WBC 13.0* 24.2*  HGB 15.1* 14.1  HCT 41.2 39.4  PLT 186 219  MCV 93.2 95.6  MCH 34.2* 34.2*  MCHC 36.7* 35.8  RDW 13.3 13.6  LYMPHSABS 3.2  --   MONOABS 1.4*  --   EOSABS 0.2  --   BASOSABS 0.0  --     Chemistries   Recent Labs Lab 07/07/13 2200 07/09/13 0528  NA 133* 136*  K 3.5* 4.1  CL 92* 99  CO2 24 25  GLUCOSE 105* 214*  BUN 16 19  CREATININE 0.85 0.76  CALCIUM 10.0 10.7*  AST 38* 22  ALT 21 19  ALKPHOS 108 132*  BILITOT 0.7 0.2*    ------------------------------------------------------------------------------------------------------------------ estimated creatinine clearance is 80.1 ml/min (by C-G formula based on Cr of 0.76). ------------------------------------------------------------------------------------------------------------------ No results found for this basename: HGBA1C,  in the  last 72 hours ------------------------------------------------------------------------------------------------------------------ No results found for this basename: CHOL, HDL, LDLCALC, TRIG, CHOLHDL, LDLDIRECT,  in the last 72 hours ------------------------------------------------------------------------------------------------------------------ No results found for this basename: TSH, T4TOTAL, FREET3, T3FREE, THYROIDAB,  in the last 72 hours ------------------------------------------------------------------------------------------------------------------ No results found for this basename: VITAMINB12, FOLATE, FERRITIN, TIBC, IRON, RETICCTPCT,  in the last 72 hours  Coagulation profile No results found for this basename: INR, PROTIME,  in the last 168 hours  No results found for this basename: DDIMER,  in the last 72 hours  Cardiac Enzymes No results found for this basename: CK, CKMB, TROPONINI, MYOGLOBIN,  in the last 168 hours ------------------------------------------------------------------------------------------------------------------ No components found with this basename: POCBNP,      Time Spent in minutes 35   Vicki Chaffin K M.D on 07/09/2013 at 12:09 PM  Between 7am to 7pm - Pager - [phone]  After 7pm go to [email or web address] - password [email or web address]  And look for the night coverage person covering for me after hours  [facility] Office  [phone]

## 2013-07-10 LAB — BASIC METABOLIC PANEL
BUN: 19 mg/dL (ref 6–23)
CO2: 27 meq/L (ref 19–32)
Calcium: 10.5 mg/dL (ref 8.4–10.5)
Chloride: 98 mEq/L (ref 96–112)
Creatinine, Ser: 0.65 mg/dL (ref 0.50–1.10)
GFR calc Af Amer: >90 mL/min (ref >90)
GFR calc non Af Amer: >90 mL/min (ref >90)
GLUCOSE: 269 mg/dL — AB (ref 70–99)
POTASSIUM: 4.1 meq/L (ref 3.7–5.3)
Sodium: 136 mEq/L — ABNORMAL LOW (ref 137–147)

## 2013-07-10 LAB — CBC
HEMATOCRIT: 40.7 % (ref 36.0–46.0)
HEMOGLOBIN: 13.9 g/dL (ref 12.0–15.0)
MCH: 33.3 pg (ref 26.0–34.0)
MCHC: 34.2 g/dL (ref 30.0–36.0)
MCV: 97.6 fL (ref 78.0–100.0)
Platelets: 235 10*3/uL (ref 150–400)
RBC: 4.17 MIL/uL (ref 3.87–5.11)
RDW: 14.1 % (ref 11.5–15.5)
WBC: 21.4 10*3/uL — ABNORMAL HIGH (ref 4.0–10.5)

## 2013-07-10 LAB — STREP PNEUMONIAE URINARY ANTIGEN: Strep Pneumo Urinary Antigen: NEGATIVE

## 2013-07-10 LAB — GLUCOSE, CAPILLARY
GLUCOSE-CAPILLARY: 375 mg/dL — AB (ref 70–99)
GLUCOSE-CAPILLARY: 300 mg/dL — AB (ref 70–99)
Glucose-Capillary: 252 mg/dL — ABNORMAL HIGH (ref 70–99)
Glucose-Capillary: 269 mg/dL — ABNORMAL HIGH (ref 70–99)

## 2013-07-10 LAB — HEMOGLOBIN A1C
HEMOGLOBIN A1C: 5.9 % — AB (ref <5.7)
MEAN PLASMA GLUCOSE: 123 mg/dL — AB (ref <117)

## 2013-07-10 MED ORDER — INSULIN GLARGINE 100 UNIT/ML ~~LOC~~ SOLN
20.0000 [IU] | Freq: Every day | SUBCUTANEOUS | Status: DC
  Administered 2013-07-10 – 2013-07-12 (×3): 20 [IU] via SUBCUTANEOUS
  Filled 2013-07-10 (×4): qty 0.2

## 2013-07-10 NOTE — Progress Notes (Addendum)
[facility]                                                                                Patient Demographics  [name], is a 58 y.o. female, DOB - 03/27/1956, [id number]  Admit date - 07/07/2013   Admitting Physician Bonnielee Haff, MD  Outpatient Primary MD for the patient is Glo Herring., MD  LOS - 3   Chief Complaint  Patient presents with  . Cough      Brief Summary   58 year old female with history of advanced COPD ongoing nicotine abuse, bipolar disorder, hypertension was a [facility] hospital with fevers and shortness of breath secondary to combination of COPD exacerbation and community-acquired pneumonia. She was initially started on Levaquin and steroids however she developed worsening leukocytosis and was subsequently switched to Rocephin and azithromycin with good effect. She is now improving but still sick. Will require a few more days of IV antibiotics before she could be discharged.    Assessment & Plan     #1 Acute COPD exacerbation with acute respiratory failure with hypoxia repeat chest x-ray suggestive of CAP: improved on azithromycin and Rocephin as she was not feeling better only with Levaquin, continue IV steroids for now as her wheezing continues, nebulizer treatments and oxygen as required. Pending sputum Gram stain culture along with Legionella antigen and strep pneumonia antigen. Counseled to quit smoking.     #2 Tobacco abuse: Nicotine patch will be prescribed. She's been counseled extensively regarding smoking cessation. She is interested in quitting. I have asked her to discuss this issue with her PCP. Considering her psychiatric history Chantix may not be an appropriate option for her.      #3 Dehydration and hypokalemia: Improved after hydration and potassium replacement.     #4 History of bipolar disorder, and anxiety: Doesn't appear to be on any maintenance treatment. We will give her Xanax as needed.     #5.  Leukocytosis likely due to combination of #1 along with steroids. Monitor.     #6. Steroid-induced hyperglycemia. Place on sliding scale insulin and monitor. Will add Lantus for better control. Will check baseline A1c.  CBG (last 3)   Recent Labs  07/09/13 2119 07/10/13 0731 07/10/13 1117  GLUCAP 322* 252* 269*      #7. Hypertension. Added Norvasc, monitor.     Code Status: full  Family Communication:    Disposition Plan: Home   Procedures     Consults      Medications  Scheduled Meds: . amLODipine  10 mg Oral Daily  . antiseptic oral rinse  15 mL Mouth Rinse BID  . azithromycin  500 mg Oral Q24H  . cefTRIAXone (ROCEPHIN)  IV  1 g Intravenous Q24H  . enoxaparin (LOVENOX) injection  40 mg Subcutaneous Q24H  . guaiFENesin  600 mg Oral BID  . insulin aspart  0-9 Units Subcutaneous TID WC  . ipratropium-albuterol  3 mL Nebulization Q4H  . methylPREDNISolone (SOLU-MEDROL) injection  60 mg Intravenous Q12H  . mometasone-formoterol  2 puff Inhalation BID  . nicotine  14 mg Transdermal Daily   Continuous Infusions:  PRN Meds:.acetaminophen, albuterol, ALPRAZolam, alum & mag hydroxide-simeth, guaiFENesin-dextromethorphan, ondansetron (ZOFRAN) IV  DVT Prophylaxis  Lovenox    Lab Results  Component Value Date   PLT 235 07/10/2013    Antibiotics    Anti-infectives   Start     Dose/Rate Route Frequency Ordered Stop   07/09/13 1300  cefTRIAXone (ROCEPHIN) 1 g in dextrose 5 % 50 mL IVPB     1 g 100 mL/hr over 30 Minutes Intravenous Every 24 hours 07/09/13 1208 07/16/13 1259   07/09/13 1230  azithromycin (ZITHROMAX) tablet 500 mg     500 mg Oral Every 24 hours 07/09/13 1208 07/16/13 1229   07/08/13 2300  levofloxacin (LEVAQUIN) IVPB 500 mg  Status:  Discontinued     500 mg 100 mL/hr over 60 Minutes Intravenous Every 24 hours 07/08/13 1308 07/09/13 1208   07/07/13 2330  levofloxacin (LEVAQUIN) IVPB 500 mg     500 mg 100 mL/hr over 60 Minutes Intravenous  Once  07/07/13 2323 07/08/13 0120          Subjective:   [name] today has, No headache, No chest pain, No abdominal pain - No Nausea, No new weakness tingling or numbness, improved Cough - SOB.    Objective:   Filed Vitals:   07/09/13 2111 07/10/13 0459 07/10/13 0835 07/10/13 1140  BP: 154/71 154/85 148/87   Pulse: 97 98    Temp: 97.4 F (36.3 C) 97.7 F (36.5 C)    TempSrc: Oral Oral    Resp: 20 20    Height:      Weight:      SpO2: 100% 96%  95%    Wt Readings from Last 3 Encounters:  07/08/13 83.462 kg (184 lb)  05/05/13 77.111 kg (170 lb)  08/25/12 80.74 kg (178 lb)     Intake/Output Summary (Last 24 hours) at 07/10/13 1214 Last data filed at 07/10/13 0842  Gross per 24 hour  Intake    880 ml  Output      0 ml  Net    880 ml    Exam Awake Alert, Oriented X 3, No new F.N deficits, Normal affect [place].AT,PERRAL Supple Neck,No JVD, No cervical lymphadenopathy appriciated.  Symmetrical Chest wall movement, Good air movement bilaterally, ++ wheezing bilat RRR,No Gallops,Rubs or new Murmurs, No Parasternal Heave +ve B.Sounds, Abd Soft, Non tender, No organomegaly appriciated, No rebound - guarding or rigidity. No Cyanosis, Clubbing or edema, No new Rash or bruise      Data Review   Micro Results No results found for this or any previous visit (from the past 240 hour(s)).  Radiology Reports Dg Chest 2 View  07/07/2013   CLINICAL DATA:  Cough.  COPD.  EXAM: CHEST  2 VIEW  COMPARISON:  None.  FINDINGS: Heart size is normal. Chronic prominence of pulmonary interstitial lung markings again demonstrated. No evidence of acute or superimposed infiltrate. No evidence of pleural effusion. No mass or lymphadenopathy identified.  IMPRESSION: Stable chronic interstitial prominence.  No acute findings.   Electronically Signed   By: Earle Gell M.D.   On: 07/07/2013 18:43    CBC  Recent Labs Lab 07/07/13 2200 07/09/13 0528 07/10/13 0513  WBC 13.0* 24.2* 21.4*   HGB 15.1* 14.1 13.9  HCT 41.2 39.4 40.7  PLT 186 219 235  MCV 93.2 95.6 97.6  MCH 34.2* 34.2* 33.3  MCHC 36.7* 35.8 34.2  RDW 13.3 13.6 14.1  LYMPHSABS 3.2  --   --   MONOABS 1.4*  --   --   EOSABS 0.2  --   --  BASOSABS 0.0  --   --     Chemistries   Recent Labs Lab 07/07/13 2200 07/09/13 0528 07/10/13 0513  NA 133* 136* 136*  K 3.5* 4.1 4.1  CL 92* 99 98  CO2 24 25 27   GLUCOSE 105* 214* 269*  BUN 16 19 19   CREATININE 0.85 0.76 0.65  CALCIUM 10.0 10.7* 10.5  AST 38* 22  --   ALT 21 19  --   ALKPHOS 108 132*  --   BILITOT 0.7 0.2*  --    ------------------------------------------------------------------------------------------------------------------ estimated creatinine clearance is 80.1 ml/min (by C-G formula based on Cr of 0.65). ------------------------------------------------------------------------------------------------------------------ No results found for this basename: HGBA1C,  in the last 72 hours ------------------------------------------------------------------------------------------------------------------ No results found for this basename: CHOL, HDL, LDLCALC, TRIG, CHOLHDL, LDLDIRECT,  in the last 72 hours ------------------------------------------------------------------------------------------------------------------ No results found for this basename: TSH, T4TOTAL, FREET3, T3FREE, THYROIDAB,  in the last 72 hours ------------------------------------------------------------------------------------------------------------------ No results found for this basename: VITAMINB12, FOLATE, FERRITIN, TIBC, IRON, RETICCTPCT,  in the last 72 hours  Coagulation profile No results found for this basename: INR, PROTIME,  in the last 168 hours  No results found for this basename: DDIMER,  in the last 72 hours  Cardiac Enzymes No results found for this basename: CK, CKMB, TROPONINI, MYOGLOBIN,  in the last 168  hours ------------------------------------------------------------------------------------------------------------------ No components found with this basename: POCBNP,      Time Spent in minutes 35   [name] K M.D on 07/10/2013 at 12:14 PM  Between 7am to 7pm - Pager - [phone]  After 7pm go to [email or web address] - password [email or web address]  And look for the night coverage person covering for me after hours  [facility] Office  [phone]

## 2013-07-10 NOTE — Progress Notes (Signed)
Inpatient Diabetes Program Recommendations  AACE/ADA: New Consensus Statement on Inpatient Glycemic Control (2013)  Target Ranges:  Prepandial:   less than 140 mg/dL      Peak postprandial:   less than 180 mg/dL (1-2 hours)      Critically ill patients:  140 - 180 mg/dL   Results for SHAWNIQUE, MARIOTTI (MRN [id number]) as of 07/10/2013 10:50  Ref. Range 07/09/2013 16:45 07/09/2013 21:19  Glucose-Capillary Latest Range: 70-99 mg/dL 339 (H) 322 (H)   Results for IKIA, CINCOTTA (MRN [id number]) as of 07/10/2013 10:50  Ref. Range 07/10/2013 05:13  Glucose Latest Range: 70-99 mg/dL 269 (H)   Inpatient Diabetes Program Recommendations Correction (SSI): Please consider increasing Novolog correction to resistant scale while receiving steroids and consider adding Novolog bedtime correction. HgbA1C: Please consider ordering an A1C to evaluate glycemic control over the past 2-3 months. Diet: May want to consider changing diet to Carb Modified Diabetic diet.  Thanks, Barnie Alderman, RN, MSN, CCRN Diabetes Coordinator Inpatient Diabetes Program [phone] (Team Pager) [phone] ([place] office) [phone] [facility] office)

## 2013-07-11 LAB — GLUCOSE, CAPILLARY
GLUCOSE-CAPILLARY: 142 mg/dL — AB (ref 70–99)
Glucose-Capillary: 301 mg/dL — ABNORMAL HIGH (ref 70–99)
Glucose-Capillary: 262 mg/dL — ABNORMAL HIGH (ref 70–99)
Glucose-Capillary: 282 mg/dL — ABNORMAL HIGH (ref 70–99)

## 2013-07-11 LAB — LEGIONELLA ANTIGEN, URINE: Legionella Antigen, Urine: NEGATIVE

## 2013-07-11 LAB — BASIC METABOLIC PANEL
BUN: 20 mg/dL (ref 6–23)
CALCIUM: 10.5 mg/dL (ref 8.4–10.5)
CO2: 30 meq/L (ref 19–32)
CREATININE: 0.64 mg/dL (ref 0.50–1.10)
Chloride: 100 mEq/L (ref 96–112)
GFR calc Af Amer: >90 mL/min (ref >90)
GLUCOSE: 141 mg/dL — AB (ref 70–99)
Potassium: 4.5 mEq/L (ref 3.7–5.3)
Sodium: 139 mEq/L (ref 137–147)

## 2013-07-11 LAB — CBC
HCT: 39.1 % (ref 36.0–46.0)
HEMOGLOBIN: 13.6 g/dL (ref 12.0–15.0)
MCH: 34.2 pg — AB (ref 26.0–34.0)
MCHC: 34.8 g/dL (ref 30.0–36.0)
MCV: 98.2 fL (ref 78.0–100.0)
Platelets: 227 10*3/uL (ref 150–400)
RBC: 3.98 MIL/uL (ref 3.87–5.11)
RDW: 13.9 % (ref 11.5–15.5)
WBC: 17.1 10*3/uL — ABNORMAL HIGH (ref 4.0–10.5)

## 2013-07-11 MED ORDER — GUAIFENESIN-DM 100-10 MG/5ML PO SYRP
5.0000 mL | ORAL_SOLUTION | ORAL | Status: DC | PRN
  Administered 2013-07-11 – 2013-07-12 (×3): 5 mL via ORAL
  Filled 2013-07-11 (×3): qty 5

## 2013-07-11 MED ORDER — IPRATROPIUM-ALBUTEROL 0.5-2.5 (3) MG/3ML IN SOLN
3.0000 mL | Freq: Four times a day (QID) | RESPIRATORY_TRACT | Status: DC
  Administered 2013-07-12 (×3): 3 mL via RESPIRATORY_TRACT
  Filled 2013-07-11 (×3): qty 3

## 2013-07-11 MED ORDER — MENTHOL 3 MG MT LOZG: 1.0000 | LOZENGE | OROMUCOSAL | Status: DC | PRN

## 2013-07-11 NOTE — Discharge Instructions (Signed)
Follow with Primary MD Glo Herring., MD in 7 days   Get CBC, CMP, checked 7 days by Primary MD and again as instructed by your Primary MD. Get a 2 view Chest X ray done next visit .   Activity: As tolerated with Full fall precautions use walker/cane & assistance as needed   Disposition Home    Diet:  Heart Healthy.  For Heart failure patients - Check your Weight same time everyday, if you gain over 2 pounds, or you develop in leg swelling, experience more shortness of breath or chest pain, call your Primary MD immediately. Follow Cardiac Low Salt Diet and 1.8 lit/day fluid restriction.   On your next visit with her primary care physician please Get Medicines reviewed and adjusted.  Please request your Prim.MD to go over all Hospital Tests and Procedure/Radiological results at the follow up, please get all Hospital records sent to your Prim MD by signing hospital release before you go home.   If you experience worsening of your admission symptoms, develop shortness of breath, life threatening emergency, suicidal or homicidal thoughts you must seek medical attention immediately by calling 911 or calling your MD immediately  if symptoms less severe.  You Must read complete instructions/literature along with all the possible adverse reactions/side effects for all the Medicines you take and that have been prescribed to you. Take any new Medicines after you have completely understood and accpet all the possible adverse reactions/side effects.   Do not drive and provide baby sitting services if your were admitted for syncope or siezures until you have seen by Primary MD or a Neurologist and advised to do so again.  Do not drive when taking Pain medications.    Do not take more than prescribed Pain, Sleep and Anxiety Medications  Special Instructions: If you have smoked or chewed Tobacco  in the last 2 yrs please stop smoking, stop any regular Alcohol  and or any Recreational drug  use.  Wear Seat belts while driving.   Please note  You were cared for by a hospitalist during your hospital stay. If you have any questions about your discharge medications or the care you received while you were in the hospital after you are discharged, you can call the unit and asked to speak with the hospitalist on call if the hospitalist that took care of you is not available. Once you are discharged, your primary care physician will handle any further medical issues. Please note that NO REFILLS for any discharge medications will be authorized once you are discharged, as it is imperative that you return to your primary care physician (or establish a relationship with a primary care physician if you do not have one) for your aftercare needs so that they can reassess your need for medications and monitor your lab values.

## 2013-07-11 NOTE — Progress Notes (Signed)
Pt. Has not slept throughout night.  Pt. Has continued to call out and hallucinate. Pt. Is oriented to self but disoriented to time, place, and situation.  Will continue to monitor.  Clayborn Bigness 07/11/2013

## 2013-07-11 NOTE — Progress Notes (Signed)
Inpatient Diabetes Program Recommendations  AACE/ADA: New Consensus Statement on Inpatient Glycemic Control (2013)  Target Ranges:  Prepandial:   less than 140 mg/dL      Peak postprandial:   less than 180 mg/dL (1-2 hours)      Critically ill patients:  140 - 180 mg/dL   Results for [name], [name] (MRN [id number]) as of 07/11/2013 12:16  Ref. Range 07/10/2013 07:31 07/10/2013 11:17 07/10/2013 16:40 07/10/2013 21:44 07/11/2013 07:34 07/11/2013 12:02  Glucose-Capillary Latest Range: 70-99 mg/dL 252 (H) 269 (H) 375 (H) 300 (H) 142 (H) 301 (H)   Inpatient Diabetes Program Recommendations Correction (SSI): Please consider increasing Novolog correction to resistant scale (if steroids are continued) and adding Novolog bedtime correction. Insulin - Meal Coverage: While receiving steroids, if post prandial glucose continues to be be elevated, please consider ordering Novolog meal coverage. Diet: May want to consider changing diet to Carb Modified Diabetic diet.  Thanks, Barnie Alderman, RN, MSN, CCRN Diabetes Coordinator Inpatient Diabetes Program [phone] (Team Pager) [phone] ([place] office) [phone] [facility] office)]

## 2013-07-11 NOTE — Progress Notes (Signed)
[facility]                                                                                Patient Demographics  [name], is a 58 y.o. female, DOB - [birth year], WUJ:[id number]  Admit date - 07/07/2013   Admitting Physician Bonnielee Haff, MD  Outpatient Primary MD for the patient is Glo Herring., MD  LOS - 4   Chief Complaint  Patient presents with  . Cough      Brief Summary   58 year old female with history of advanced COPD ongoing nicotine abuse, bipolar disorder, hypertension was a brittle hospital with fevers and shortness of breath secondary to combination of COPD exacerbation and community-acquired pneumonia. She was initially started on Levaquin and steroids however she developed worsening leukocytosis and was subsequently switched to Rocephin and azithromycin with good effect. She is now improving but still sick. Will require a few more days of IV antibiotics before she could be discharged.    Assessment & Plan     #1 Acute COPD exacerbation with acute respiratory failure with hypoxia repeat chest x-ray suggestive of CAP: improved on azithromycin and Rocephin, continue IV steroids will switch to PO in am, nebulizer treatments and oxygen as required. Pending sputum Gram stain culture along with Legionella antigen , negative strep pneumonia antigen. Counseled to quit smoking.     #2 Tobacco abuse: Nicotine patch will be prescribed. She's been counseled extensively regarding smoking cessation. She is interested in quitting. I have asked her to discuss this issue with her PCP.        #3 Dehydration and hypokalemia: Improved after hydration and potassium replacement.     #4 History of bipolar disorder, and anxiety: Doesn't appear to be on any maintenance treatment. We will give her Xanax as needed.     #5. Leukocytosis likely due to combination of #1 along with steroids. Improving .     #6. Steroid-induced hyperglycemia. Place on sliding  scale insulin and monitor. Will add Lantus for better control. stable baseline A1c.  CBG (last 3)   Recent Labs  07/10/13 1640 07/10/13 2144 07/11/13 0734  GLUCAP 375* 300* 142*    Lab Results  Component Value Date   HGBA1C 5.9* 07/10/2013      #7. Hypertension. Added Norvasc, monitor.     Code Status: full  Family Communication:    Disposition Plan: Home   Procedures     Consults      Medications  Scheduled Meds: . amLODipine  10 mg Oral Daily  . antiseptic oral rinse  15 mL Mouth Rinse BID  . azithromycin  500 mg Oral Q24H  . cefTRIAXone (ROCEPHIN)  IV  1 g Intravenous Q24H  . enoxaparin (LOVENOX) injection  40 mg Subcutaneous Q24H  . insulin aspart  0-9 Units Subcutaneous TID WC  . insulin glargine  20 Units Subcutaneous Daily  . ipratropium-albuterol  3 mL Nebulization Q4H  . methylPREDNISolone (SOLU-MEDROL) injection  60 mg Intravenous Q12H  . mometasone-formoterol  2 puff Inhalation BID  . nicotine  14 mg Transdermal Daily   Continuous Infusions:  PRN Meds:.acetaminophen, albuterol, ALPRAZolam, alum & mag hydroxide-simeth, guaiFENesin-dextromethorphan, menthol-cetylpyridinium, ondansetron (ZOFRAN) IV  DVT Prophylaxis  Lovenox    Lab Results  Component Value Date   PLT 227 07/11/2013    Antibiotics    Anti-infectives   Start     Dose/Rate Route Frequency Ordered Stop   07/09/13 1300  cefTRIAXone (ROCEPHIN) 1 g in dextrose 5 % 50 mL IVPB     1 g 100 mL/hr over 30 Minutes Intravenous Every 24 hours 07/09/13 1208 07/16/13 1259   07/09/13 1230  azithromycin (ZITHROMAX) tablet 500 mg     500 mg Oral Every 24 hours 07/09/13 1208 07/16/13 1229   07/08/13 2300  levofloxacin (LEVAQUIN) IVPB 500 mg  Status:  Discontinued     500 mg 100 mL/hr over 60 Minutes Intravenous Every 24 hours 07/08/13 1308 07/09/13 1208   07/07/13 2330  levofloxacin (LEVAQUIN) IVPB 500 mg     500 mg 100 mL/hr over 60 Minutes Intravenous  Once 07/07/13 2323 07/08/13 0120           Subjective:   [name] today has, No headache, No chest pain, No abdominal pain - No Nausea, No new weakness tingling or numbness, improved Cough - SOB.    Objective:   Filed Vitals:   07/10/13 2137 07/10/13 2319 07/11/13 0535 07/11/13 0751  BP: 137/74  150/87   Pulse: 88  79   Temp: 97.8 F (36.6 C)  97.4 F (36.3 C)   TempSrc: Oral  Oral   Resp: 20  20   Height:      Weight:      SpO2: 94% 95% 97% 95%    Wt Readings from Last 3 Encounters:  07/08/13 83.462 kg (184 lb)  05/05/13 77.111 kg (170 lb)  08/25/12 80.74 kg (178 lb)     Intake/Output Summary (Last 24 hours) at 07/11/13 0838 Last data filed at 07/10/13 1805  Gross per 24 hour  Intake   1100 ml  Output      0 ml  Net   1100 ml    Exam Awake Alert, Oriented X 3, No new F.N deficits, Normal affect [place].AT,PERRAL Supple Neck,No JVD, No cervical lymphadenopathy appriciated.  Symmetrical Chest wall movement, Good air movement bilaterally, + wheezing bilat RRR,No Gallops,Rubs or new Murmurs, No Parasternal Heave +ve B.Sounds, Abd Soft, Non tender, No organomegaly appriciated, No rebound - guarding or rigidity. No Cyanosis, Clubbing or edema, No new Rash or bruise      Data Review   Micro Results No results found for this or any previous visit (from the past 240 hour(s)).  Radiology Reports Dg Chest 2 View  07/07/2013   CLINICAL DATA:  Cough.  COPD.  EXAM: CHEST  2 VIEW  COMPARISON:  None.  FINDINGS: Heart size is normal. Chronic prominence of pulmonary interstitial lung markings again demonstrated. No evidence of acute or superimposed infiltrate. No evidence of pleural effusion. No mass or lymphadenopathy identified.  IMPRESSION: Stable chronic interstitial prominence.  No acute findings.   Electronically Signed   By: Earle Gell M.D.   On: 07/07/2013 18:43    CBC  Recent Labs Lab 07/07/13 2200 07/09/13 0528 07/10/13 0513 07/11/13 0500  WBC 13.0* 24.2* 21.4* 17.1*  HGB 15.1* 14.1  13.9 13.6  HCT 41.2 39.4 40.7 39.1  PLT 186 219 235 227  MCV 93.2 95.6 97.6 98.2  MCH 34.2* 34.2* 33.3 34.2*  MCHC 36.7* 35.8 34.2 34.8  RDW 13.3 13.6 14.1 13.9  LYMPHSABS 3.2  --   --   --   MONOABS 1.4*  --   --   --  EOSABS 0.2  --   --   --   BASOSABS 0.0  --   --   --     Chemistries   Recent Labs Lab 07/07/13 2200 07/09/13 0528 07/10/13 0513 07/11/13 0500  NA 133* 136* 136* 139  K 3.5* 4.1 4.1 4.5  CL 92* 99 98 100  CO2 24 25 27 30   GLUCOSE 105* 214* 269* 141*  BUN 16 19 19 20   CREATININE 0.85 0.76 0.65 0.64  CALCIUM 10.0 10.7* 10.5 10.5  AST 38* 22  --   --   ALT 21 19  --   --   ALKPHOS 108 132*  --   --   BILITOT 0.7 0.2*  --   --    ------------------------------------------------------------------------------------------------------------------ estimated creatinine clearance is 80.1 ml/min (by C-G formula based on Cr of 0.64). ------------------------------------------------------------------------------------------------------------------  Recent Labs  07/10/13 1221  HGBA1C 5.9*   ------------------------------------------------------------------------------------------------------------------ No results found for this basename: CHOL, HDL, LDLCALC, TRIG, CHOLHDL, LDLDIRECT,  in the last 72 hours ------------------------------------------------------------------------------------------------------------------ No results found for this basename: TSH, T4TOTAL, FREET3, T3FREE, THYROIDAB,  in the last 72 hours ------------------------------------------------------------------------------------------------------------------ No results found for this basename: VITAMINB12, FOLATE, FERRITIN, TIBC, IRON, RETICCTPCT,  in the last 72 hours  Coagulation profile No results found for this basename: INR, PROTIME,  in the last 168 hours  No results found for this basename: DDIMER,  in the last 72 hours  Cardiac Enzymes No results found for this basename: CK, CKMB,  TROPONINI, MYOGLOBIN,  in the last 168 hours ------------------------------------------------------------------------------------------------------------------ No components found with this basename: POCBNP,      Time Spent in minutes 35   SINGH,PRASHANT K M.D on 07/11/2013 at 8:38 AM  Between 7am to 7pm - Pager - [phone]  After 7pm go to [email or web address] - password [email or web address]  And look for the night coverage person covering for me after hours  [facility] Office  [phone]

## 2013-07-12 LAB — CBC
HCT: 43.4 % (ref 36.0–46.0)
HEMOGLOBIN: 14.8 g/dL (ref 12.0–15.0)
MCH: 33.2 pg (ref 26.0–34.0)
MCHC: 34.1 g/dL (ref 30.0–36.0)
MCV: 97.3 fL (ref 78.0–100.0)
Platelets: 256 10*3/uL (ref 150–400)
RBC: 4.46 MIL/uL (ref 3.87–5.11)
RDW: 13.6 % (ref 11.5–15.5)
WBC: 14.1 10*3/uL — ABNORMAL HIGH (ref 4.0–10.5)

## 2013-07-12 LAB — GLUCOSE, CAPILLARY
Glucose-Capillary: 213 mg/dL — ABNORMAL HIGH (ref 70–99)
Glucose-Capillary: 192 mg/dL — ABNORMAL HIGH (ref 70–99)

## 2013-07-12 MED ORDER — PREDNISONE 5 MG PO TABS: ORAL_TABLET | ORAL | Status: DC

## 2013-07-12 MED ORDER — CEFUROXIME AXETIL 500 MG PO TABS: 500.0000 mg | ORAL_TABLET | Freq: Two times a day (BID) | ORAL | Status: DC

## 2013-07-12 MED ORDER — ALBUTEROL SULFATE (2.5 MG/3ML) 0.083% IN NEBU: 2.5000 mg | INHALATION_SOLUTION | Freq: Four times a day (QID) | RESPIRATORY_TRACT | Status: DC | PRN

## 2013-07-12 MED ORDER — AZITHROMYCIN 500 MG PO TABS: 500.0000 mg | ORAL_TABLET | Freq: Every day | ORAL | Status: DC

## 2013-07-12 MED ORDER — NICOTINE 14 MG/24HR TD PT24: 14.0000 mg | MEDICATED_PATCH | Freq: Every day | TRANSDERMAL | Status: DC

## 2013-07-12 MED ORDER — AMLODIPINE BESYLATE 10 MG PO TABS: 10.0000 mg | ORAL_TABLET | Freq: Every day | ORAL | Status: DC

## 2013-07-12 NOTE — Discharge Summary (Addendum)
[name], is a 58 y.o. female  DOB [birth year]  MRN [id number].  Admission date:  07/07/2013  Admitting Physician  Bonnielee Haff, MD  Discharge Date:  07/12/2013   Primary MD  Glo Herring., MD  Recommendations for primary care physician for things to follow:   Monitor clinically   Admission Diagnosis  Tobacco use disorder [305.1] Bronchitis [490] Acute exacerbation of chronic obstructive pulmonary disease (COPD) [491.21] Hypoxia [799.02] Bipolar disorder [296.80] Acute respiratory failure with hypoxia [518.81] COPD with exacerbation [491.21]  Discharge Diagnosis   COPD exacerbation-CAP  Principal Problem:   Acute exacerbation of chronic obstructive pulmonary disease (COPD) Active Problems:   COPD (chronic obstructive pulmonary disease)   Bipolar disorder   Tobacco use disorder   GERD (gastroesophageal reflux disease)   Acute respiratory failure with hypoxia      Past Medical History  Diagnosis Date  . COPD (chronic obstructive pulmonary disease)     Past Surgical History  Procedure Laterality Date  . Kidney stone surgery    . Cholecystectomy    . Hernia repair       Discharge Condition: Stable   Follow-up Information   Follow up with Glo Herring., MD. Schedule an appointment as soon as possible for a visit in 1 week.   Specialty:  Internal Medicine   Contact information:   [address] [place] [address] [phone]       Follow up with HAWKINS,EDWARD L, MD. Schedule an appointment as soon as possible for a visit in 1 week.   Specialty:  Pulmonary Disease   Contact information:   [place] [address] [phone]         Consults obtained -     Discharge Medications      Medication List         albuterol (2.5 MG/3ML) 0.083% nebulizer solution  Commonly known  as:  PROVENTIL  Take 3 mLs (2.5 mg total) by nebulization every 6 (six) hours as needed for wheezing or shortness of breath.     albuterol 108 (90 BASE) MCG/ACT inhaler  Commonly known as:  PROVENTIL HFA;VENTOLIN HFA  Inhale 2 puffs into the lungs every 6 (six) hours as needed for wheezing or shortness of breath.     albuterol-ipratropium 18-103 MCG/ACT inhaler  Commonly known as:  COMBIVENT  Inhale 2 puffs into the lungs every 6 (six) hours as needed for shortness of breath.     amLODipine 10 MG tablet  Commonly known as:  NORVASC  Take 1 tablet (10 mg total) by mouth daily.     azithromycin 500 MG tablet  Commonly known as:  ZITHROMAX  Take 1 tablet (500 mg total) by mouth daily.     cefUROXime 500 MG tablet  Commonly known as:  CEFTIN  Take 1 tablet (500 mg total) by mouth 2 (two) times daily with a meal.     Fluticasone-Salmeterol 250-50 MCG/DOSE Aepb  Commonly known as:  ADVAIR  Inhale 1  puff into the lungs every 12 (twelve) hours.     nicotine 14 mg/24hr patch  Commonly known as:  NICODERM CQ - dosed in mg/24 hours  Place 1 patch (14 mg total) onto the skin daily.     predniSONE 5 MG tablet  Commonly known as:  DELTASONE  Label  & dispense according to the schedule below. 10 Pills PO for 3 days then, 8 Pills PO for 3 days, 6 Pills PO for 3 days, 4 Pills PO for 3 days, 2 Pills PO for 3 days, 1 Pills PO for 3 days, 1/2 Pill  PO for 3 days then STOP. Total 95 pills.         Diet and Activity recommendation: See Discharge Instructions below   Discharge Instructions     Follow with Primary MD Glo Herring., MD in 7 days   Get CBC, CMP, checked 7 days by Primary MD and again as instructed by your Primary MD. Get a 2 view Chest X ray done next visit .   Activity: As tolerated with Full fall precautions use walker/cane & assistance as needed   Disposition Home    Diet:  Heart Healthy.  For Heart failure patients - Check your Weight same time everyday, if you  gain over 2 pounds, or you develop in leg swelling, experience more shortness of breath or chest pain, call your Primary MD immediately. Follow Cardiac Low Salt Diet and 1.8 lit/day fluid restriction.   On your next visit with her primary care physician please Get Medicines reviewed and adjusted.  Please request your Prim.MD to go over all Hospital Tests and Procedure/Radiological results at the follow up, please get all Hospital records sent to your Prim MD by signing hospital release before you go home.   If you experience worsening of your admission symptoms, develop shortness of breath, life threatening emergency, suicidal or homicidal thoughts you must seek medical attention immediately by calling 911 or calling your MD immediately  if symptoms less severe.  You Must read complete instructions/literature along with all the possible adverse reactions/side effects for all the Medicines you take and that have been prescribed to you. Take any new Medicines after you have completely understood and accpet all the possible adverse reactions/side effects.   Do not drive and provide baby sitting services if your were admitted for syncope or siezures until you have seen by Primary MD or a Neurologist and advised to do so again.  Do not drive when taking Pain medications.    Do not take more than prescribed Pain, Sleep and Anxiety Medications  Special Instructions: If you have smoked or chewed Tobacco  in the last 2 yrs please stop smoking, stop any regular Alcohol  and or any Recreational drug use.  Wear Seat belts while driving.   Please note  You were cared for by a hospitalist during your hospital stay. If you have any questions about your discharge medications or the care you received while you were in the hospital after you are discharged, you can call the unit and asked to speak with the hospitalist on call if the hospitalist that took care of you is not available. Once you are discharged,  your primary care physician will handle any further medical issues. Please note that NO REFILLS for any discharge medications will be authorized once you are discharged, as it is imperative that you return to your primary care physician (or establish a relationship with a primary care physician if you do not  have one) for your aftercare needs so that they can reassess your need for medications and monitor your lab values.     Major procedures and Radiology Reports - PLEASE review detailed and final reports for all details, in brief -     Dg Chest 2 View  07/09/2013   CLINICAL DATA:  Chest pain and shortness of breath.  EXAM: CHEST  2 VIEW  COMPARISON:  07/07/2013 and 05/05/2013  FINDINGS: There is a faint new infiltrate in the left midzone. There is new peribronchial thickening.  The pulmonary vascularity also appears slightly more prominent as does the cardiac silhouette. No effusions. No acute osseous abnormality. Slight thoracic scoliosis, unchanged.  IMPRESSION: New faint infiltrate in the left midzone. This could represent pneumonia or patchy pulmonary edema.   Electronically Signed   By: Rozetta Nunnery M.D.   On: 07/09/2013 11:25   Dg Chest 2 View  07/07/2013   CLINICAL DATA:  Cough.  COPD.  EXAM: CHEST  2 VIEW  COMPARISON:  None.  FINDINGS: Heart size is normal. Chronic prominence of pulmonary interstitial lung markings again demonstrated. No evidence of acute or superimposed infiltrate. No evidence of pleural effusion. No mass or lymphadenopathy identified.  IMPRESSION: Stable chronic interstitial prominence.  No acute findings.   Electronically Signed   By: Earle Gell M.D.   On: 07/07/2013 18:43    Micro Results      No results found for this or any previous visit (from the past 240 hour(s)).   History of present illness and  Hospital Course:     Kindly see H&P for history of present illness and admission details, please review complete Labs, Consult reports and Test reports for all  details in brief [name], is a 58 y.o. female, patient with history of  COPD, bipolar disorder, ongoing nicotine abuse was admitted to the hospital with cough fever shortness of breath and leukocytosis secondary to community-acquired pneumonia and COPD exacerbation.        #1 Acute COPD exacerbation with acute respiratory failure with hypoxia repeat chest x-ray suggestive of CAP: improved on azithromycin and Rocephin along with IV steroids . Negative sputum Gram stain culture along with Legionella antigen & strep pneumonia antigen. Counseled to quit smoking. He is now off of oxygen and nitroglycerin percent on room air, she will be discharged oral steroid taper along with oral antibiotics for community-acquired pneumonia. We'll follow with PCP we'll request PCP to repeat 2 view chest x-ray in a week to document clarity of infiltrate. She has nebulizer machine at home I will provide her with Albuterol refills.      #2 Tobacco abuse: Nicotine patch will be prescribed.       #3 Dehydration and hypokalemia: Improved after hydration and potassium replacement.       #4 History of bipolar disorder, and anxiety: Doesn't appear to be on any maintenance treatment. When he not suicidal homicidal and follow with PCP outpatient.       #5. Leukocytosis likely due to combination of #1 along with steroids. Improving .        #7. Hypertension. Added Norvasc, continue monitoring blood pressure and adjust medications as needed.      Today   Subjective:   [name] today has no headache,no chest abdominal pain,no new weakness tingling or numbness, feels much better wants to go home today.    Objective:   Blood pressure 152/90, pulse 79, temperature 97.5 F (36.4 C), temperature source Oral, resp. rate 20,  height 5\' 4"  (1.626 m), weight 83.462 kg (184 lb), SpO2 92.00%.   Intake/Output Summary (Last 24 hours) at 07/12/13 0917 Last data filed at 07/11/13  1857  Gross per 24 hour  Intake    480 ml  Output      0 ml  Net    480 ml    Exam Awake Alert, Oriented *3, No new F.N deficits, Normal affect [place].AT,PERRAL Supple Neck,No JVD, No cervical lymphadenopathy appriciated.  Symmetrical Chest wall movement, Good air movement bilaterally, minimal wheezing RRR,No Gallops,Rubs or new Murmurs, No Parasternal Heave +ve B.Sounds, Abd Soft, Non tender, No organomegaly appriciated, No rebound -guarding or rigidity. No Cyanosis, Clubbing or edema, No new Rash or bruise  Data Review   CBC w Diff: Lab Results  Component Value Date   WBC 14.1* 07/12/2013   HGB 14.8 07/12/2013   HCT 43.4 07/12/2013   PLT 256 07/12/2013   LYMPHOPCT 24 07/07/2013   MONOPCT 11 07/07/2013   EOSPCT 2 07/07/2013   BASOPCT 0 07/07/2013    CMP: Lab Results  Component Value Date   NA 139 07/11/2013   K 4.5 07/11/2013   CL 100 07/11/2013   CO2 30 07/11/2013   BUN 20 07/11/2013   CREATININE 0.64 07/11/2013   PROT 7.2 07/09/2013   ALBUMIN 3.0* 07/09/2013   BILITOT 0.2* 07/09/2013   ALKPHOS 132* 07/09/2013   AST 22 07/09/2013   ALT 19 07/09/2013  .   Total Time in preparing paper work, data evaluation and todays exam - 35 minutes  Thurnell Lose M.D on 07/12/2013 at 9:17 AM  [place]  [phone]

## 2013-07-12 NOTE — Progress Notes (Signed)
Patient with orders to be discharge home. Discharge instructions given, patient verbalized understanding. Prescriptions faxed to [facility]. Patient stable. Patient left in private vehicle with spouse.

## 2013-07-13 NOTE — Progress Notes (Signed)
UR chart review completed.  

## 2013-08-01 ENCOUNTER — Other Ambulatory Visit (HOSPITAL_COMMUNITY): Payer: Self-pay | Admitting: Internal Medicine

## 2013-08-01 DIAGNOSIS — Z139 Encounter for screening, unspecified: Secondary | ICD-10-CM

## 2013-08-09 ENCOUNTER — Ambulatory Visit (HOSPITAL_COMMUNITY): Payer: Medicaid Other

## 2013-08-21 ENCOUNTER — Ambulatory Visit: Payer: Self-pay | Admitting: Obstetrics & Gynecology

## 2013-08-28 ENCOUNTER — Ambulatory Visit (HOSPITAL_COMMUNITY): Payer: Medicaid Other

## 2013-09-06 ENCOUNTER — Ambulatory Visit (HOSPITAL_COMMUNITY): Payer: Medicaid Other

## 2013-09-11 ENCOUNTER — Encounter (INDEPENDENT_AMBULATORY_CARE_PROVIDER_SITE_OTHER): Payer: Self-pay

## 2013-09-11 ENCOUNTER — Ambulatory Visit (INDEPENDENT_AMBULATORY_CARE_PROVIDER_SITE_OTHER): Payer: Medicaid Other | Admitting: Obstetrics & Gynecology

## 2013-09-11 ENCOUNTER — Other Ambulatory Visit (HOSPITAL_COMMUNITY): Payer: Self-pay | Admitting: Internal Medicine

## 2013-09-11 ENCOUNTER — Other Ambulatory Visit (HOSPITAL_COMMUNITY)
Admission: RE | Admit: 2013-09-11 | Discharge: 2013-09-11 | Disposition: A | Discharge status: Home / Self Care | Payer: Medicaid Other | Source: Ambulatory Visit | Attending: Obstetrics & Gynecology | Admitting: Obstetrics & Gynecology

## 2013-09-11 ENCOUNTER — Encounter: Payer: Self-pay | Admitting: Obstetrics & Gynecology

## 2013-09-11 VITALS — BP 110/60 | Ht 65.0 in | Wt 204.0 lb

## 2013-09-11 DIAGNOSIS — Z1151 Encounter for screening for human papillomavirus (HPV): Secondary | ICD-10-CM | POA: Insufficient documentation

## 2013-09-11 DIAGNOSIS — Z1231 Encounter for screening mammogram for malignant neoplasm of breast: Secondary | ICD-10-CM

## 2013-09-11 DIAGNOSIS — Z0000 Encounter for general adult medical examination without abnormal findings: Secondary | ICD-10-CM

## 2013-09-11 DIAGNOSIS — Z01419 Encounter for gynecological examination (general) (routine) without abnormal findings: Secondary | ICD-10-CM

## 2013-09-11 DIAGNOSIS — Z124 Encounter for screening for malignant neoplasm of cervix: Secondary | ICD-10-CM | POA: Insufficient documentation

## 2013-09-11 MED ORDER — ESTRADIOL 1 MG PO TABS: 1.0000 mg | ORAL_TABLET | Freq: Every day | ORAL | Status: DC

## 2013-09-11 MED ORDER — MEDROXYPROGESTERONE ACETATE 2.5 MG PO TABS: 2.5000 mg | ORAL_TABLET | Freq: Every day | ORAL | Status: DC

## 2013-09-11 NOTE — Progress Notes (Signed)
Patient ID: [name], female   DOB: 04-25-1956, 58 y.o.   MRN: [id number] Subjective:     [name] is a 58 y.o. female here for a routine exam.  No LMP recorded. Patient is postmenopausal. No obstetric history on file. Birth Control Method:  pmp  Menstrual Calendar(currently): na  Current complaints: debilitating hot flashes.   Current acute medical issues:  COPD   Recent Gynecologic History No LMP recorded. Patient is postmenopausal. Last Pap: 2012,  normal Last mammogram: 2014,  normal  Past Medical History  Diagnosis Date  . COPD (chronic obstructive pulmonary disease)     Past Surgical History  Procedure Laterality Date  . Kidney stone surgery    . Cholecystectomy    . Hernia repair      OB History   Grav Para Term Preterm Abortions TAB SAB Ect Mult Living                  History   Social History  . Marital Status: Legally Separated    Spouse Name: N/A    Number of Children: N/A  . Years of Education: N/A   Social History Main Topics  . Smoking status: Current Every Day Smoker  . Smokeless tobacco: None  . Alcohol Use: No  . Drug Use: No  . Sexual Activity: Yes    Birth Control/ Protection: Post-menopausal   Other Topics Concern  . None   Social History Narrative  . None    Family History  Problem Relation Age of Onset  . COPD Mother      Review of Systems  Review of Systems  Constitutional: Negative for fever, chills, weight loss, malaise/fatigue and diaphoresis.  HENT: Negative for hearing loss, ear pain, nosebleeds, congestion, sore throat, neck pain, tinnitus and ear discharge.   Eyes: Negative for blurred vision, double vision, photophobia, pain, discharge and redness.  Respiratory: Negative for cough, hemoptysis, sputum production, shortness of breath, wheezing and stridor.   Cardiovascular: Negative for chest pain, palpitations, orthopnea, claudication, leg swelling and PND.  Gastrointestinal: negative for  abdominal pain. Negative for heartburn, nausea, vomiting, diarrhea, constipation, blood in stool and melena.  Genitourinary: Negative for dysuria, urgency, frequency, hematuria and flank pain.  Musculoskeletal: Negative for myalgias, back pain, joint pain and falls.  Skin: Negative for itching and rash.  Neurological: Negative for dizziness, tingling, tremors, sensory change, speech change, focal weakness, seizures, loss of consciousness, weakness and headaches.  Endo/Heme/Allergies: Negative for environmental allergies and polydipsia. Does not bruise/bleed easily.  Psychiatric/Behavioral: Negative for depression, suicidal ideas, hallucinations, memory loss and substance abuse. The patient is not nervous/anxious and does not have insomnia.        Objective:    Physical Exam  Vitals reviewed. Constitutional: She is oriented to person, place, and time. She appears well-developed and well-nourished.  HENT:  Head: Normocephalic and atraumatic.        Right Ear: External ear normal.  Left Ear: External ear normal.  Nose: Nose normal.  Mouth/Throat: Oropharynx is clear and moist.  Eyes: Conjunctivae and EOM are normal. Pupils are equal, round, and reactive to light. Right eye exhibits no discharge. Left eye exhibits no discharge. No scleral icterus.  Neck: Normal range of motion. Neck supple. No tracheal deviation present. No thyromegaly present.  Cardiovascular: Normal rate, regular rhythm, normal heart sounds and intact distal pulses.  Exam reveals no gallop and no friction rub.   No murmur heard. Respiratory: Effort normal and breath sounds normal. No respiratory  distress. She has no wheezes. She has no rales. She exhibits no tenderness.  GI: Soft. Bowel sounds are normal. She exhibits no distension and no mass. There is no tenderness. There is no rebound and no guarding.  Genitourinary:  Breasts no masses skin changes or nipple changes bilaterally      Vulva is normal without  lesions Vagina is pink moist without discharge Cervix normal in appearance and pap is done Uterus is normal size shape and contour Adnexa is negative with normal sized ovaries  Rectal    hemoccult negative, normal tone, no masses  Musculoskeletal: Normal range of motion. She exhibits no edema and no tenderness.  Neurological: She is alert and oriented to person, place, and time. She has normal reflexes. She displays normal reflexes. No cranial nerve deficit. She exhibits normal muscle tone. Coordination normal.  Skin: Skin is warm and dry. No rash noted. No erythema. No pallor.  Psychiatric: She has a normal mood and affect. Her behavior is normal. Judgment and thought content normal.       Assessment:    Healthy female exam.   Menopausal Plan:    Hormone replacement therapy: hormone replacement therapy: Estradil 1 mg and provera 2.5 mg daily. Follow up in: 3 months.

## 2013-09-12 ENCOUNTER — Encounter: Payer: Self-pay | Admitting: Neurology

## 2013-09-14 ENCOUNTER — Ambulatory Visit (HOSPITAL_COMMUNITY)
Admission: RE | Admit: 2013-09-14 | Discharge: 2013-09-14 | Disposition: A | Discharge status: Home / Self Care | Payer: Medicaid Other | Source: Ambulatory Visit | Attending: Internal Medicine | Admitting: Internal Medicine

## 2013-09-14 DIAGNOSIS — Z1231 Encounter for screening mammogram for malignant neoplasm of breast: Secondary | ICD-10-CM

## 2013-09-18 ENCOUNTER — Ambulatory Visit (HOSPITAL_COMMUNITY): Payer: Medicaid Other

## 2013-09-26 ENCOUNTER — Institutional Professional Consult (permissible substitution): Payer: Medicaid Other | Admitting: Neurology

## 2013-10-03 ENCOUNTER — Ambulatory Visit (HOSPITAL_COMMUNITY): Payer: Medicaid Other

## 2013-10-03 ENCOUNTER — Ambulatory Visit (HOSPITAL_COMMUNITY): Admission: RE | Admit: 2013-10-03 | Payer: Medicaid Other | Source: Ambulatory Visit

## 2013-10-04 ENCOUNTER — Other Ambulatory Visit (HOSPITAL_COMMUNITY): Payer: Self-pay | Admitting: Pulmonary Disease

## 2013-10-04 DIAGNOSIS — R079 Chest pain, unspecified: Secondary | ICD-10-CM

## 2013-10-08 ENCOUNTER — Telehealth: Payer: Self-pay | Admitting: Obstetrics and Gynecology

## 2013-10-08 MED ORDER — METRONIDAZOLE 500 MG PO TABS: ORAL_TABLET | ORAL | Status: DC

## 2013-10-08 NOTE — Telephone Encounter (Signed)
rx flagyl pt and partner, Liset Mcmonigle [birth year]

## 2013-10-08 NOTE — Telephone Encounter (Signed)
Pt informed of abnormal pap (LSIL) with +Trich from 09/11/2013. Call transferred to front staff for Colposcopy appt to be scheduled and pt informed abx to be prescribed for the + Trich, partners name is Destiny Hagin, 12/29/1957.

## 2013-10-10 ENCOUNTER — Ambulatory Visit (HOSPITAL_COMMUNITY): Payer: Medicaid Other

## 2013-10-12 ENCOUNTER — Encounter (HOSPITAL_COMMUNITY): Payer: Self-pay

## 2013-10-12 ENCOUNTER — Ambulatory Visit (HOSPITAL_COMMUNITY)
Admission: RE | Admit: 2013-10-12 | Discharge: 2013-10-12 | Disposition: A | Discharge status: Home / Self Care | Payer: Medicaid Other | Source: Ambulatory Visit | Attending: Pulmonary Disease | Admitting: Pulmonary Disease

## 2013-10-12 ENCOUNTER — Emergency Department (HOSPITAL_COMMUNITY): Payer: Medicaid Other

## 2013-10-12 ENCOUNTER — Emergency Department (HOSPITAL_COMMUNITY)
Admission: EM | Admit: 2013-10-12 | Discharge: 2013-10-12 | Disposition: A | Discharge status: Home / Self Care | Payer: Medicaid Other | Attending: Emergency Medicine | Admitting: Emergency Medicine

## 2013-10-12 ENCOUNTER — Encounter (HOSPITAL_COMMUNITY): Payer: Self-pay | Admitting: Emergency Medicine

## 2013-10-12 DIAGNOSIS — Z792 Long term (current) use of antibiotics: Secondary | ICD-10-CM | POA: Insufficient documentation

## 2013-10-12 DIAGNOSIS — F17200 Nicotine dependence, unspecified, uncomplicated: Secondary | ICD-10-CM | POA: Insufficient documentation

## 2013-10-12 DIAGNOSIS — H9209 Otalgia, unspecified ear: Secondary | ICD-10-CM | POA: Insufficient documentation

## 2013-10-12 DIAGNOSIS — R51 Headache: Secondary | ICD-10-CM | POA: Insufficient documentation

## 2013-10-12 DIAGNOSIS — Z880 Allergy status to penicillin: Secondary | ICD-10-CM | POA: Insufficient documentation

## 2013-10-12 DIAGNOSIS — Z79899 Other long term (current) drug therapy: Secondary | ICD-10-CM | POA: Insufficient documentation

## 2013-10-12 DIAGNOSIS — R079 Chest pain, unspecified: Secondary | ICD-10-CM

## 2013-10-12 DIAGNOSIS — J449 Chronic obstructive pulmonary disease, unspecified: Secondary | ICD-10-CM

## 2013-10-12 DIAGNOSIS — R5381 Other malaise: Secondary | ICD-10-CM | POA: Insufficient documentation

## 2013-10-12 DIAGNOSIS — I10 Essential (primary) hypertension: Secondary | ICD-10-CM | POA: Insufficient documentation

## 2013-10-12 DIAGNOSIS — J4489 Other specified chronic obstructive pulmonary disease: Secondary | ICD-10-CM | POA: Insufficient documentation

## 2013-10-12 DIAGNOSIS — Z8659 Personal history of other mental and behavioral disorders: Secondary | ICD-10-CM | POA: Insufficient documentation

## 2013-10-12 DIAGNOSIS — IMO0002 Reserved for concepts with insufficient information to code with codable children: Secondary | ICD-10-CM | POA: Insufficient documentation

## 2013-10-12 DIAGNOSIS — R5383 Other fatigue: Secondary | ICD-10-CM

## 2013-10-12 HISTORY — DX: Essential (primary) hypertension: I10

## 2013-10-12 HISTORY — DX: Bipolar disorder, unspecified: F31.9

## 2013-10-12 LAB — COMPREHENSIVE METABOLIC PANEL
ALT: 15 U/L (ref 0–35)
AST: 17 U/L (ref 0–37)
Albumin: 3.3 g/dL — ABNORMAL LOW (ref 3.5–5.2)
Alkaline Phosphatase: 94 U/L (ref 39–117)
BUN: 16 mg/dL (ref 6–23)
CALCIUM: 10.3 mg/dL (ref 8.4–10.5)
CO2: 28 meq/L (ref 19–32)
CREATININE: 0.66 mg/dL (ref 0.50–1.10)
Chloride: 103 mEq/L (ref 96–112)
GLUCOSE: 111 mg/dL — AB (ref 70–99)
Potassium: 4.4 mEq/L (ref 3.7–5.3)
Sodium: 140 mEq/L (ref 137–147)
TOTAL PROTEIN: 7.6 g/dL (ref 6.0–8.3)
Total Bilirubin: 0.2 mg/dL — ABNORMAL LOW (ref 0.3–1.2)

## 2013-10-12 LAB — CBC WITH DIFFERENTIAL/PLATELET
Basophils Absolute: 0 10*3/uL (ref 0.0–0.1)
Basophils Relative: 0 % (ref 0–1)
EOS ABS: 0.3 10*3/uL (ref 0.0–0.7)
EOS PCT: 4 % (ref 0–5)
HEMATOCRIT: 36.8 % (ref 36.0–46.0)
Hemoglobin: 12.3 g/dL (ref 12.0–15.0)
LYMPHS ABS: 2.2 10*3/uL (ref 0.7–4.0)
LYMPHS PCT: 29 % (ref 12–46)
MCH: 32.5 pg (ref 26.0–34.0)
MCHC: 33.4 g/dL (ref 30.0–36.0)
MCV: 97.1 fL (ref 78.0–100.0)
MONO ABS: 0.9 10*3/uL (ref 0.1–1.0)
Monocytes Relative: 12 % (ref 3–12)
Neutro Abs: 4.1 10*3/uL (ref 1.7–7.7)
Neutrophils Relative %: 55 % (ref 43–77)
Platelets: 369 10*3/uL (ref 150–400)
RBC: 3.79 MIL/uL — ABNORMAL LOW (ref 3.87–5.11)
RDW: 14.2 % (ref 11.5–15.5)
WBC: 7.5 10*3/uL (ref 4.0–10.5)

## 2013-10-12 LAB — TROPONIN I: Troponin I: <0.3 ng/mL (ref <0.30)

## 2013-10-12 MED ORDER — SODIUM CHLORIDE 0.9 % IV BOLUS (SEPSIS)
1000.0000 mL | Freq: Once | INTRAVENOUS | Status: AC
  Administered 2013-10-12: 1000 mL via INTRAVENOUS

## 2013-10-12 MED ORDER — SODIUM CHLORIDE 0.9 % IV SOLN
INTRAVENOUS | Status: AC
  Filled 2013-10-12: qty 750

## 2013-10-12 MED ORDER — SODIUM CHLORIDE 0.9 % IJ SOLN
INTRAMUSCULAR | Status: AC
  Filled 2013-10-12: qty 500

## 2013-10-12 NOTE — ED Notes (Signed)
Multiple complaints, states she is dizzy, has ear problems, weakness. States she had to come in for a ct and decided to be seen today.

## 2013-10-12 NOTE — ED Provider Notes (Signed)
CSN: [id number]     Arrival date & time 10/12/13  9924 History   This chart was scribed for Maudry Diego, MD by Delphia Grates, ED Scribe. This patient was seen in room APA08/APA08 and the patient's care was started at 8:58 AM.    Chief Complaint  Patient presents with  . Dizziness      Patient is a 58 y.o. female presenting with dizziness. The history is provided by the patient. No language interpreter was used.  Dizziness Quality:  Room spinning and lightheadedness Severity:  Mild Onset quality:  Gradual Duration:  3 days Timing:  Intermittent Chronicity:  New Context: head movement   Associated symptoms: no chest pain, no diarrhea and no headaches    HPI Comments: [name] is a 58 y.o. female with a history of COPD who presents to the Emergency Department complaining of 2-3 days of intermittent dizziness and lightheadedness with associated fatigue. She reports associated left ear pain with muffled hearing. She reports she had an MRI scheduled by her PCP but was unable to have this complete. She also reports a history of COPD and states having increased SOB over the past few days as well.   Past Medical History  Diagnosis Date  . COPD (chronic obstructive pulmonary disease)   . Hypertension   . Bipolar 1 disorder    Past Surgical History  Procedure Laterality Date  . Kidney stone surgery    . Cholecystectomy    . Hernia repair     Family History  Problem Relation Age of Onset  . COPD Mother    History  Substance Use Topics  . Smoking status: Current Every Day Smoker  . Smokeless tobacco: Not on file  . Alcohol Use: No   OB History   Grav Para Term Preterm Abortions TAB SAB Ect Mult Living                 Review of Systems  Constitutional: Positive for fatigue. Negative for appetite change.  HENT: Positive for ear pain (left sided). Negative for congestion, ear discharge and sinus pressure.   Eyes: Negative for discharge.  Respiratory:  Negative for cough.   Cardiovascular: Negative for chest pain.  Gastrointestinal: Negative for abdominal pain and diarrhea.  Genitourinary: Negative for frequency and hematuria.  Musculoskeletal: Negative for back pain.  Skin: Negative for rash.  Neurological: Positive for dizziness and light-headedness. Negative for seizures and headaches.  Psychiatric/Behavioral: Negative for hallucinations.      Allergies  Penicillins and Septra  Home Medications   Current Outpatient Rx  Name  Route  Sig  Dispense  Refill  . albuterol (PROVENTIL HFA;VENTOLIN HFA) 108 (90 BASE) MCG/ACT inhaler   Inhalation   Inhale 2 puffs into the lungs every 6 (six) hours as needed for wheezing or shortness of breath.         Marland Kitchen albuterol (PROVENTIL) (2.5 MG/3ML) 0.083% nebulizer solution   Nebulization   Take 3 mLs (2.5 mg total) by nebulization every 6 (six) hours as needed for wheezing or shortness of breath.   75 mL   12   . albuterol-ipratropium (COMBIVENT) 18-103 MCG/ACT inhaler   Inhalation   Inhale 2 puffs into the lungs every 6 (six) hours as needed for shortness of breath.          Marland Kitchen amLODipine (NORVASC) 10 MG tablet   Oral   Take 1 tablet (10 mg total) by mouth daily.   30 tablet   2   .  azithromycin (ZITHROMAX) 500 MG tablet   Oral   Take 1 tablet (500 mg total) by mouth daily.   5 each   0   . cefUROXime (CEFTIN) 500 MG tablet   Oral   Take 1 tablet (500 mg total) by mouth 2 (two) times daily with a meal.   5 tablet   0   . estradiol (ESTRACE) 1 MG tablet   Oral   Take 1 tablet (1 mg total) by mouth daily.   30 tablet   11   . Fluticasone-Salmeterol (ADVAIR) 250-50 MCG/DOSE AEPB   Inhalation   Inhale 1 puff into the lungs every 12 (twelve) hours.         . medroxyPROGESTERone (PROVERA) 2.5 MG tablet   Oral   Take 1 tablet (2.5 mg total) by mouth daily.   30 tablet   11   . metroNIDAZOLE (FLAGYL) 500 MG tablet      Take 4 po now   4 tablet   0   . nicotine  (NICODERM CQ - DOSED IN MG/24 HOURS) 14 mg/24hr patch   Transdermal   Place 1 patch (14 mg total) onto the skin daily.   28 patch   0   . predniSONE (DELTASONE) 5 MG tablet      Label  & dispense according to the schedule below. 10 Pills PO for 3 days then, 8 Pills PO for 3 days, 6 Pills PO for 3 days, 4 Pills PO for 3 days, 2 Pills PO for 3 days, 1 Pills PO for 3 days, 1/2 Pill  PO for 3 days then STOP. Total 95 pills.   95 tablet   0     Dispense as written.   . theophylline (THEODUR) 100 MG 12 hr tablet   Oral   Take 100 mg by mouth 2 (two) times daily.          BP 130/74  Pulse 88  Temp(Src) 97.8 F (36.6 C) (Oral)  Resp 22  Ht 5\' 4"  (1.626 m)  Wt 203 lb (92.08 kg)  BMI 34.83 kg/m2  SpO2 94% Physical Exam  Nursing note and vitals reviewed. Constitutional: She is oriented to person, place, and time. She appears well-developed.  HENT:  Head: Normocephalic.  Cerumen impaction in left external ear canal.  Eyes: Conjunctivae and EOM are normal. No scleral icterus.  Neck: Neck supple. No thyromegaly present.  Cardiovascular: Normal rate, regular rhythm and normal heart sounds.  Exam reveals no gallop and no friction rub.   No murmur heard. Pulmonary/Chest: Effort normal and breath sounds normal. No stridor. She has no wheezes. She has no rales. She exhibits no tenderness.  Abdominal: She exhibits no distension. There is no tenderness. There is no rebound.  Musculoskeletal: Normal range of motion. She exhibits no edema.  Lymphadenopathy:    She has no cervical adenopathy.  Neurological: She is oriented to person, place, and time. She exhibits normal muscle tone. Coordination normal.  Skin: No rash noted. No erythema.  Psychiatric: She has a normal mood and affect. Her behavior is normal.    ED Course  Procedures (including critical care time) DIAGNOSTIC STUDIES: Oxygen Saturation is 94% on RA, adequate by my interpretation.    COORDINATION OF CARE: 8:58 AM- Will  order chest CT and CXR along with CBC, CMP, and troponin. Pt advised of plan for treatment and pt agrees.  Medications  sodium chloride 0.9 % bolus 1,000 mL (1,000 mLs Intravenous New Bag/Given 10/12/13 0949)  Labs Review Labs Reviewed  CBC WITH DIFFERENTIAL - Abnormal; Notable for the following:    RBC 3.79 (*)    All other components within normal limits  COMPREHENSIVE METABOLIC PANEL - Abnormal; Notable for the following:    Glucose, Bld 111 (*)    Albumin 3.3 (*)    Total Bilirubin 0.2 (*)    All other components within normal limits  TROPONIN I   Imaging Review Ct Chest Wo Contrast  10/12/2013   CLINICAL DATA:  Chest pain over 1 year duration. Tightness in the chest. Poor oxygen saturation.  EXAM: CT CHEST WITHOUT CONTRAST  TECHNIQUE: Multidetector CT imaging of the chest was performed following the standard protocol without IV contrast.  COMPARISON:  Radiography 07/09/2013 and previous  FINDINGS: There is no pleural fluid. There is a small amount of pericardial fluid. Extensive coronary artery calcification is present. Mild atherosclerosis of the aorta and branch vessels is present. There are no enlarged mediastinal lymph nodes.  Left lung: There is volume loss or scarring in the lingula. There are mild linear scar is at the left lung base. Small areas of mild patchy airspace density are present that could represent minimal pneumonia.  Right lung: There is complete collapse of the right middle lobe. There is segmental collapse of the posterior segment of the right upper lobe. I do not identify an obstructing endobronchial lesion.  No evidence of focal mass or nodule.  Scans in the upper abdomen do not show any significant finding.  Ordinary mild degenerative changes affect the spine.  IMPRESSION: Right middle lobe collapse and posterior segment right upper lobe collapse consistent with atelectasis/atelectatic pneumonia. No obstructing endobronchial lesion is identified.  Mild volume  loss or scarring in the lingula. There is a mild patchy parenchymal density in the left lung could represent mild pneumonia on that side.  Small amount of pericardial fluid.  Extensive coronary artery calcification.   Electronically Signed   By: Nelson Chimes M.D.   On: 10/12/2013 08:23     EKG Interpretation None      MDM   Final diagnoses:  None    Spoke with dr. Luan Pulling about the atelectasis and she will follow up with him in 1-2 weeks The chart was scribed for me under my direct supervision.  I personally performed the history, physical, and medical decision making and all procedures in the evaluation of this patient.Maudry Diego, MD 10/12/13 1124

## 2013-10-12 NOTE — Discharge Instructions (Signed)
Follow up with your md in 1-2 weeks.

## 2013-10-12 NOTE — ED Notes (Signed)
Discharge instructions reviewed with pt, questions answered. Pt verbalized understanding.  

## 2013-10-12 NOTE — ED Notes (Signed)
Irrigated left ear with warm water. Small amount of wax removed.  Pt states her ear feels better.

## 2013-10-16 ENCOUNTER — Encounter (INDEPENDENT_AMBULATORY_CARE_PROVIDER_SITE_OTHER): Payer: Self-pay

## 2013-10-16 ENCOUNTER — Ambulatory Visit (INDEPENDENT_AMBULATORY_CARE_PROVIDER_SITE_OTHER): Payer: Medicaid Other | Admitting: Neurology

## 2013-10-16 ENCOUNTER — Encounter: Payer: Self-pay | Admitting: Neurology

## 2013-10-16 VITALS — BP 117/73 | HR 75 | Temp 97.0°F | Ht 64.0 in | Wt 205.0 lb

## 2013-10-16 DIAGNOSIS — J9819 Other pulmonary collapse: Secondary | ICD-10-CM

## 2013-10-16 DIAGNOSIS — J449 Chronic obstructive pulmonary disease, unspecified: Secondary | ICD-10-CM

## 2013-10-16 DIAGNOSIS — M545 Low back pain, unspecified: Secondary | ICD-10-CM

## 2013-10-16 DIAGNOSIS — G4733 Obstructive sleep apnea (adult) (pediatric): Secondary | ICD-10-CM

## 2013-10-16 DIAGNOSIS — J9811 Atelectasis: Secondary | ICD-10-CM

## 2013-10-16 NOTE — Progress Notes (Signed)
Subjective:    Patient ID: [name] is a 58 y.o. female.  HPI    Rebekah Age, MD, PhD [facility] [address], [address] [place], [place] [address]  Dear Dr. Gerarda Peterson,  I saw your patient, [name], upon your kind request in my neurologic clinic today for initial consultation of her sleep disorder, in particular, concern for obstructive sleep apnea. The patient is accompanied by her husband, [name], today. As you know, [name] is a 58 year old right-handed woman with an underlying medical history of COPD (s/p recent admission in January for pneumonia), reflux disease, chronic back pain on narcotic pain medication, s/p umbilical hernia repair, benzodiazepine dependence, panic disorder and bipolar affective disorder, smoking, and hyperlipidemia, who reports snoring, and daytime somnolence and her husband reports witnessed apneas. She had a recent overnight pulse oximetry test on 08/01/13, which I reviewed: The study was done on room air. The lowest oxygen saturation was 71%, mean was 94.8%, time below 88% saturation was less than 1 minute. Lowest pulse rate was 69, mean was 78, highest pulse rate was 104. She had a CT chest on 10/12/13: Right middle lobe collapse and posterior segment right upper lobe  collapse consistent with atelectasis/atelectatic pneumonia. No obstructing endobronchial lesion is identified.  Mild volume loss or scarring in the lingula. There is a mild patchy parenchymal density in the left lung could represent mild pneumonia on that side. Small amount of pericardial fluid. Extensive coronary artery calcification.   She has been called in an ABx yesterday, but has not taken it yet. She has an appointment with Dr. Luan Peterson on 11/19/13.   Her typical bedtime is reported to be around 11 PM and usual wake time is around 5 to 7 AM. Sleep onset typically occurs within a few minutes. She reports feeling marginally rested upon  awakening. She wakes up on an average 4 to 5 times in the middle of the night and has to go to the bathroom 3 times on a typical night. She denies morning headaches, but often has a headache in the later mornings.  She reports excessive daytime somnolence (EDS) and Her Epworth Sleepiness Score (ESS) is 7/24 today. She has not fallen asleep while driving. The patient has not been taking a scheduled nap, but sometimes takes an occasional unscheduled, and denies feeling refreshed after a nap.  She has been known to snore for the past few years. Snoring is reportedly marked, and associated with choking sounds and witnessed apneas. The patient admits to a sense of choking or strangling feeling. There is no report of nighttime reflux, with frequent nighttime cough experienced. The patient has noted any RLS symptoms and is known to kick while asleep or before falling asleep. There is no known family history, as she was adopted. Her biological mother had lung disease.  She is a restless sleeper and in the morning, the bed is quite disheveled.   She denies cataplexy, sleep paralysis, hypnagogic or hypnopompic hallucinations, or sleep attacks. She does not report any vivid dreams, nightmares, dream enactments, or parasomnias, such as sleep talking or sleep walking. The patient has not had a sleep study or a home sleep test.  She consumes 2 caffeinated beverages per day, usually in the form of sodas and some coffee, but avoids it as it flares up her anxiety. Marland Kitchen  Her bedroom is usually dark and cool. She uses a fan. There is no TV in the bedroom and no pets in the bedroom.  Her Past Medical History Is Significant For: Past Medical History  Diagnosis Date  . COPD (chronic obstructive pulmonary disease)   . Hypertension   . Bipolar 1 disorder     Her Past Surgical History Is Significant For: Past Surgical History  Procedure Laterality Date  . Kidney stone surgery    . Cholecystectomy    . Hernia repair       Her Family History Is Significant For: Family History  Problem Relation Peterson of Onset  . COPD Mother   . Stroke      Her Social History Is Significant For: History   Social History  . Marital Status: Married    Spouse Name: N/A    Number of Children: N/A  . Years of Education: N/A   Social History Main Topics  . Smoking status: Current Every Day Smoker  . Smokeless tobacco: None  . Alcohol Use: No  . Drug Use: No  . Sexual Activity: Yes    Birth Control/ Protection: Post-menopausal   Other Topics Concern  . None   Social History Narrative  . None    Her Allergies Are:  Allergies  Allergen Reactions  . Penicillins Other (See Comments)    REACTION: Unknown  . Septra [Sulfamethoxazole-Trimethoprim] Other (See Comments)    REACTION: Unknown  :   Her Current Medications Are:  Outpatient Encounter Prescriptions as of 10/16/2013  Medication Sig  . albuterol (PROVENTIL HFA;VENTOLIN HFA) 108 (90 BASE) MCG/ACT inhaler Inhale 2 puffs into the lungs every 6 (six) hours as needed for wheezing or shortness of breath.  Marland Kitchen albuterol (PROVENTIL) (2.5 MG/3ML) 0.083% nebulizer solution Take 3 mLs (2.5 mg total) by nebulization every 6 (six) hours as needed for wheezing or shortness of breath.  Marland Kitchen albuterol-ipratropium (COMBIVENT) 18-103 MCG/ACT inhaler Inhale 2 puffs into the lungs every 6 (six) hours as needed for shortness of breath.   Marland Kitchen amLODipine (NORVASC) 10 MG tablet Take 1 tablet (10 mg total) by mouth daily.  Marland Kitchen estradiol (ESTRACE) 1 MG tablet Take 1 tablet (1 mg total) by mouth daily.  . Fluticasone Furoate-Vilanterol (BREO ELLIPTA) 100-25 MCG/INH AEPB Inhale 1 puff into the lungs daily.  . Fluticasone-Salmeterol (ADVAIR) 250-50 MCG/DOSE AEPB Inhale 1 puff into the lungs every 12 (twelve) hours.  Marland Kitchen HYDROcodone-acetaminophen (NORCO) 10-325 MG per tablet Take 1-2 tablets by mouth every 4 (four) hours as needed for moderate pain.  . medroxyPROGESTERone (PROVERA) 2.5 MG tablet  Take 1 tablet (2.5 mg total) by mouth daily.  . nicotine (NICODERM CQ - DOSED IN MG/24 HOURS) 14 mg/24hr patch Place 1 patch (14 mg total) onto the skin daily.  . theophylline (THEODUR) 100 MG 12 hr tablet Take 100 mg by mouth 2 (two) times daily.  :  Review of Systems:  Out of a complete 14 point review of systems, all are reviewed and negative with the exception of these symptoms as listed below: Review of Systems  Constitutional: Positive for activity change (disinterest), appetite change and fatigue.  HENT: Positive for tinnitus.   Eyes: Positive for visual disturbance (blurred vision).  Respiratory: Positive for apnea (snoring), shortness of breath and wheezing.   Cardiovascular: Positive for chest pain, palpitations and leg swelling.       Murmur  Gastrointestinal: Negative.   Endocrine: Positive for cold intolerance and heat intolerance.       Flushing  Genitourinary: Negative.   Musculoskeletal: Positive for myalgias.       Leg cramps  Skin: Negative.   Allergic/Immunologic: Negative.  Neurological: Positive for dizziness, weakness and headaches.       Memory loss  Hematological: Bruises/bleeds easily.  Psychiatric/Behavioral: Positive for confusion, sleep disturbance (insomnia, e.d.s., snoring, shift work), dysphoric mood and decreased concentration. The patient is nervous/anxious.     Objective:  Neurologic Exam  Physical Exam Physical Examination:   Filed Vitals:   10/16/13 1045  BP: 117/73  Pulse: 75  Temp: 97 F (36.1 C)    General Examination: The patient is a very pleasant 58 y.o. female in no acute distress. She appears well-developed and well-nourished and well groomed.   HEENT: Normocephalic, atraumatic, pupils are equal, round and reactive to light and accommodation. Funduscopic exam is normal with sharp disc margins noted. Extraocular tracking is good without limitation to gaze excursion or nystagmus noted. Normal smooth pursuit is noted. Hearing is  grossly intact. Tympanic membranes are clear bilaterally. Face is symmetric with normal facial animation and normal facial sensation. Speech is clear with no dysarthria noted. There is no hypophonia. There is no lip, neck/head, jaw or voice tremor. Neck is supple with full range of passive and active motion. There are no carotid bruits on auscultation. Oropharynx exam reveals: mild mouth dryness, adequate dental hygiene and moderate airway crowding, due to fairly narrow airway and redundant soft palate and tonsils in place. Mallampati is class II. Tongue protrudes centrally and palate elevates symmetrically. Tonsils are 2+ in size. Neck size is 15.75 inches.   Chest: Clear to auscultation without wheezing, rhonchi or crackles noted.  Heart: S1+S2+0, regular and normal without murmurs, rubs or gallops noted.   Abdomen: Soft, non-tender and non-distended with normal bowel sounds appreciated on auscultation.  Extremities: There is trace pitting edema in the distal lower extremities bilaterally. Pedal pulses are intact.  Skin: Warm and dry without trophic changes noted. There are no varicose veins.  Musculoskeletal: exam reveals no obvious joint deformities, tenderness or joint swelling or erythema.   Neurologically:  Mental status: The patient is awake, alert and oriented in all 4 spheres. Her immediate and remote memory, attention, language skills and fund of knowledge are appropriate. There is no evidence of aphasia, agnosia, apraxia or anomia. Speech is clear with normal prosody and enunciation. Thought process is linear. Mood is normal and affect is normal.  Cranial nerves II - XII are as described above under HEENT exam. In addition: shoulder shrug is normal with equal shoulder height noted. Motor exam: Normal bulk, strength and tone is noted. There is no drift, tremor or rebound. Romberg is negative. Reflexes are 2+ throughout. Babinski: Toes are flexor bilaterally. Fine motor skills and  coordination: intact with normal finger taps, normal hand movements, normal rapid alternating patting, normal foot taps and normal foot agility.  Cerebellar testing: No dysmetria or intention tremor on finger to nose testing. Heel to shin is unremarkable bilaterally. There is no truncal or gait ataxia.  Sensory exam: intact to light touch, pinprick, vibration, temperature sense and proprioception in the upper and lower extremities.  Gait, station and balance: She stands easily. No veering to one side is noted. No leaning to one side is noted. Posture is Peterson-appropriate and stance is narrow based. Gait shows normal stride length and normal pace. No problems turning are noted. She turns en bloc. Tandem walk is slightly difficult for her. Intact toe and heel stance is noted.                Assessment and Plan:   In summary, MIRYAH RALLS is a very  pleasant 58 y.o.-year old female with an underlying medical history of COPD (s/p recent admission in January for pneumonia), reflux disease, chronic back pain on narcotic pain medication, s/p umbilical hernia repair, smoking, benzodiazepine dependence, panic disorder and bipolar affective disorder, smoking, and hyperlipidemia, with a history and physical exam concerning for obstructive sleep apnea (OSA). She had a recent COPD exacerbation.  I had a long chat with the patient and her husband about my findings and the diagnosis of OSA, its prognosis and treatment options. We talked about medical treatments, surgical interventions and non-pharmacological approaches. I explained in particular the risks and ramifications of untreated moderate to severe OSA, especially with respect to developing cardiovascular disease down the Road, including congestive heart failure, difficult to treat hypertension, cardiac arrhythmias, or stroke. Even type 2 diabetes has, in part, been linked to untreated OSA. Symptoms of untreated OSA include daytime sleepiness, memory problems,  mood irritability and mood disorder such as depression and anxiety, lack of energy, as well as recurrent headaches, especially morning headaches. We talked about smoking cessation and trying to maintain a healthy lifestyle in general, as well as the importance of weight control. I encouraged the patient to eat healthy, exercise daily and keep well hydrated, to keep a scheduled bedtime and wake time routine, to not skip any meals and eat healthy snacks in between meals. I advised the patient not to drive when feeling sleepy. She does not typically take the Norco at night. She takes Xanax 3-4 times a day. I recommended the following at this time: sleep study with potential positive airway pressure titration.  I explained the sleep test procedure to the patient and also outlined possible surgical and non-surgical treatment options of OSA, including the use of a custom-made dental device (which would require a referral to a specialist dentist or oral surgeon), upper airway surgical options, such as pillar implants, radiofrequency surgery, tongue base surgery, and UPPP (which would involve a referral to an ENT surgeon). Rarely, jaw surgery such as mandibular advancement may be considered.  I also explained the CPAP treatment option to the patient, who indicated that she would be willing to try CPAP if the need arises. I explained the importance of being compliant with PAP treatment, not only for insurance purposes but primarily to improve Her symptoms, and for the patient's long term health benefit, including to reduce Her cardiovascular risks. I answered all their questions today and the patient and husband were in agreement. I would like to see her back after the sleep study is completed and encouraged them to call with any interim questions, concerns, problems or updates.   Thank you very much for allowing me to participate in the care of this nice patient. If I can be of any further assistance to you please do  not hesitate to call me at [phone].  Sincerely,   Rebekah Age, MD, PhD

## 2013-10-16 NOTE — Patient Instructions (Signed)

## 2013-10-18 ENCOUNTER — Encounter: Payer: Medicaid Other | Admitting: Obstetrics & Gynecology

## 2013-10-23 ENCOUNTER — Encounter: Payer: Self-pay | Admitting: *Deleted

## 2013-10-23 ENCOUNTER — Telehealth: Payer: Self-pay | Admitting: *Deleted

## 2013-10-23 NOTE — Telephone Encounter (Signed)
Message copied by Doyne Keel on Tue Oct 23, 2013  9:13 AM ------      Message from: Florian Buff      Created: Tue Oct 23, 2013  8:21 AM       Please schedule pt to come in for colposcopy due to abnormal Pap and evaluation of trichomonas            Florian Buff            ----- Message -----         From: [facility] in [facility] Interface         Sent: 09/17/2013   5:50 PM           To: Florian Buff, MD                   ------

## 2013-10-23 NOTE — Telephone Encounter (Signed)
Confirmed appt for colposcopy on 10/31/2013 with Dr. Elonda Husky for abnormal pap from 09/11/2013. Pt states took prescription for Flagyl for treatment of Trich, partner also treated. Reiterated with pt not to have sex until POC appt. Pt verbalized understanding.

## 2013-10-25 ENCOUNTER — Telehealth: Payer: Self-pay | Admitting: Obstetrics & Gynecology

## 2013-10-25 ENCOUNTER — Encounter (HOSPITAL_COMMUNITY): Payer: Medicaid Other

## 2013-10-25 ENCOUNTER — Inpatient Hospital Stay (HOSPITAL_COMMUNITY): Admission: RE | Admit: 2013-10-25 | Payer: Medicaid Other | Source: Ambulatory Visit

## 2013-10-25 MED ORDER — METRONIDAZOLE 500 MG PO TABS: ORAL_TABLET | ORAL | Status: DC

## 2013-10-25 NOTE — Telephone Encounter (Signed)
Pt states had sex with her husband after being treated for Trich, requesting another RX. Pt has an appt for 10/31/2013 for POC and colposcopy.

## 2013-10-25 NOTE — Telephone Encounter (Signed)
Pt informed Flagyl e-scribed for herself and partner. Pt encouraged not to have sex until next appt 11/13/2013 (POC and colposcopy).

## 2013-10-26 ENCOUNTER — Telehealth: Payer: Self-pay | Admitting: *Deleted

## 2013-10-26 NOTE — Telephone Encounter (Signed)
Pt called stating [facility] did not delivery Flagyl for pt husband for treatment of trich. [place] and clarified RX per order in pt chart.

## 2013-10-31 ENCOUNTER — Encounter: Payer: Medicaid Other | Admitting: Obstetrics & Gynecology

## 2013-11-06 ENCOUNTER — Encounter (HOSPITAL_COMMUNITY): Payer: Medicaid Other

## 2013-11-09 ENCOUNTER — Ambulatory Visit (HOSPITAL_COMMUNITY): Admission: RE | Admit: 2013-11-09 | Payer: Medicaid Other | Source: Ambulatory Visit

## 2013-11-09 ENCOUNTER — Ambulatory Visit (HOSPITAL_COMMUNITY)
Admission: RE | Admit: 2013-11-09 | Discharge: 2013-11-09 | Disposition: A | Discharge status: Home / Self Care | Payer: Medicaid Other | Source: Ambulatory Visit | Attending: Pulmonary Disease | Admitting: Pulmonary Disease

## 2013-11-09 MED ORDER — ALBUTEROL SULFATE (2.5 MG/3ML) 0.083% IN NEBU: 2.5000 mg | INHALATION_SOLUTION | Freq: Once | RESPIRATORY_TRACT | Status: DC

## 2013-11-13 ENCOUNTER — Ambulatory Visit (INDEPENDENT_AMBULATORY_CARE_PROVIDER_SITE_OTHER): Payer: Medicaid Other | Admitting: Obstetrics & Gynecology

## 2013-11-13 ENCOUNTER — Encounter: Payer: Self-pay | Admitting: Obstetrics & Gynecology

## 2013-11-13 ENCOUNTER — Encounter: Payer: Medicaid Other | Admitting: Obstetrics & Gynecology

## 2013-11-13 VITALS — BP 140/80 | Ht 64.3 in | Wt 213.0 lb

## 2013-11-13 DIAGNOSIS — N870 Mild cervical dysplasia: Secondary | ICD-10-CM | POA: Insufficient documentation

## 2013-11-13 DIAGNOSIS — A599 Trichomoniasis, unspecified: Secondary | ICD-10-CM

## 2013-11-13 NOTE — Progress Notes (Signed)
Patient ID: [name], female   DOB: [birth year], 58 y.o.   MRN: [id number] Pap performed:  09/2013 Result:  LSIL, +trichomonas  History of abnormal Pap: no  Colposcopy: adequate Acetowhite changes       negative Punctation                      negative Mosaicism                      negative Abnormal Vessels          negative  Biopsy performed:          no  Impression: Normal colposcopy  Recommendation: Follow up Pap 1 year  Wet Prep: Negative for trichomonas      Past Medical History  Diagnosis Date  . COPD (chronic obstructive pulmonary disease)   . Hypertension   . Bipolar 1 disorder     Past Surgical History  Procedure Laterality Date  . Kidney stone surgery    . Cholecystectomy    . Hernia repair      OB History   Grav Para Term Preterm Abortions TAB SAB Ect Mult Living                  Allergies  Allergen Reactions  . Penicillins Other (See Comments)    REACTION: Unknown  . Septra [Sulfamethoxazole-Trimethoprim] Other (See Comments)    REACTION: Unknown    History   Social History  . Marital Status: Married    Spouse Name: N/A    Number of Children: N/A  . Years of Education: N/A   Social History Main Topics  . Smoking status: Current Some Day Smoker  . Smokeless tobacco: None  . Alcohol Use: No  . Drug Use: No  . Sexual Activity: Yes    Birth Control/ Protection: Post-menopausal   Other Topics Concern  . None   Social History Narrative  . None    Family History  Problem Relation Age of Onset  . COPD Mother   . Stroke

## 2013-11-16 ENCOUNTER — Encounter: Payer: Medicaid Other | Admitting: Obstetrics & Gynecology

## 2013-11-19 ENCOUNTER — Inpatient Hospital Stay (HOSPITAL_COMMUNITY): Admission: RE | Admit: 2013-11-19 | Payer: Medicaid Other | Source: Ambulatory Visit

## 2013-11-21 DIAGNOSIS — G4761 Periodic limb movement disorder: Secondary | ICD-10-CM

## 2013-11-21 DIAGNOSIS — G479 Sleep disorder, unspecified: Secondary | ICD-10-CM

## 2013-11-21 DIAGNOSIS — G4733 Obstructive sleep apnea (adult) (pediatric): Secondary | ICD-10-CM

## 2013-11-27 ENCOUNTER — Ambulatory Visit (HOSPITAL_COMMUNITY): Admission: RE | Admit: 2013-11-27 | Payer: Medicaid Other | Source: Ambulatory Visit

## 2013-11-28 ENCOUNTER — Encounter: Payer: Self-pay | Admitting: Gastroenterology

## 2013-11-28 ENCOUNTER — Telehealth: Payer: Self-pay | Admitting: Gastroenterology

## 2013-11-28 ENCOUNTER — Ambulatory Visit: Payer: Medicaid Other | Admitting: Gastroenterology

## 2013-11-28 NOTE — Telephone Encounter (Signed)
Mailed letter and PCP is aware

## 2013-11-28 NOTE — Telephone Encounter (Signed)
Pt was a no show

## 2013-11-30 ENCOUNTER — Encounter (HOSPITAL_COMMUNITY): Payer: Self-pay | Admitting: Emergency Medicine

## 2013-11-30 ENCOUNTER — Emergency Department (HOSPITAL_COMMUNITY): Payer: Medicaid Other

## 2013-11-30 ENCOUNTER — Emergency Department (HOSPITAL_COMMUNITY)
Admission: EM | Admit: 2013-11-30 | Discharge: 2013-11-30 | Disposition: A | Discharge status: Home / Self Care | Payer: Medicaid Other | Attending: Emergency Medicine | Admitting: Emergency Medicine

## 2013-11-30 DIAGNOSIS — F17200 Nicotine dependence, unspecified, uncomplicated: Secondary | ICD-10-CM | POA: Insufficient documentation

## 2013-11-30 DIAGNOSIS — S43401A Unspecified sprain of right shoulder joint, initial encounter: Secondary | ICD-10-CM

## 2013-11-30 DIAGNOSIS — Z8659 Personal history of other mental and behavioral disorders: Secondary | ICD-10-CM | POA: Insufficient documentation

## 2013-11-30 DIAGNOSIS — IMO0002 Reserved for concepts with insufficient information to code with codable children: Secondary | ICD-10-CM | POA: Insufficient documentation

## 2013-11-30 DIAGNOSIS — I10 Essential (primary) hypertension: Secondary | ICD-10-CM | POA: Insufficient documentation

## 2013-11-30 DIAGNOSIS — W108XXA Fall (on) (from) other stairs and steps, initial encounter: Secondary | ICD-10-CM | POA: Insufficient documentation

## 2013-11-30 DIAGNOSIS — Z880 Allergy status to penicillin: Secondary | ICD-10-CM | POA: Insufficient documentation

## 2013-11-30 DIAGNOSIS — Z792 Long term (current) use of antibiotics: Secondary | ICD-10-CM | POA: Insufficient documentation

## 2013-11-30 DIAGNOSIS — S53401A Unspecified sprain of right elbow, initial encounter: Secondary | ICD-10-CM

## 2013-11-30 DIAGNOSIS — Y9389 Activity, other specified: Secondary | ICD-10-CM | POA: Insufficient documentation

## 2013-11-30 DIAGNOSIS — J4489 Other specified chronic obstructive pulmonary disease: Secondary | ICD-10-CM | POA: Insufficient documentation

## 2013-11-30 DIAGNOSIS — Z79899 Other long term (current) drug therapy: Secondary | ICD-10-CM | POA: Insufficient documentation

## 2013-11-30 DIAGNOSIS — J449 Chronic obstructive pulmonary disease, unspecified: Secondary | ICD-10-CM | POA: Insufficient documentation

## 2013-11-30 DIAGNOSIS — W010XXA Fall on same level from slipping, tripping and stumbling without subsequent striking against object, initial encounter: Secondary | ICD-10-CM | POA: Insufficient documentation

## 2013-11-30 DIAGNOSIS — Y929 Unspecified place or not applicable: Secondary | ICD-10-CM | POA: Insufficient documentation

## 2013-11-30 DIAGNOSIS — Z23 Encounter for immunization: Secondary | ICD-10-CM | POA: Insufficient documentation

## 2013-11-30 HISTORY — DX: Major depressive disorder, single episode, unspecified: F32.9

## 2013-11-30 HISTORY — DX: Anxiety disorder, unspecified: F41.9

## 2013-11-30 HISTORY — DX: Depression, unspecified: F32.A

## 2013-11-30 MED ORDER — TETANUS-DIPHTH-ACELL PERTUSSIS 5-2.5-18.5 LF-MCG/0.5 IM SUSP
0.5000 mL | Freq: Once | INTRAMUSCULAR | Status: AC
  Administered 2013-11-30: 0.5 mL via INTRAMUSCULAR
  Filled 2013-11-30: qty 0.5

## 2013-11-30 MED ORDER — OXYCODONE-ACETAMINOPHEN 5-325 MG PO TABS
2.0000 | ORAL_TABLET | Freq: Once | ORAL | Status: AC
  Administered 2013-11-30: 2 via ORAL
  Filled 2013-11-30: qty 2

## 2013-11-30 MED ORDER — OXYCODONE-ACETAMINOPHEN 5-325 MG PO TABS: 1.0000 | ORAL_TABLET | ORAL | Status: DC | PRN

## 2013-11-30 NOTE — ED Provider Notes (Signed)
CSN: [id number]     Arrival date & time 11/30/13  1222 History  This chart was scribed for Sharyon Cable, MD by Roe Coombs, ED Scribe. The patient was seen in room APA07/APA07. Patient's care was started at 3:13 PM.    Chief Complaint  Patient presents with  . Arm Injury    Patient is a 58 y.o. female presenting with arm injury.  Arm Injury Location:  Elbow and shoulder Time since incident:  5 days Injury: yes   Mechanism of injury: fall   Fall:    Fall occurred:  Down stairs (2 steps) Shoulder location:  R shoulder Elbow location:  R elbow Chronicity:  New Worsened by:  Movement Associated symptoms: no fever      HPI Comments: [name] is a 58 y.o. female who presents to the Emergency Department complaining of worsening right elbow, upper arm, and shoulder pain resulting from a fall that occurred 5 days ago. Patient states that she tripped while going down the stairs at step 2 and landed on her right arm. She reports that immediately after falling she felt a shooting pain that traveled from her elbow to her shoulder. Pain is worse with movement of her elbow and shoulder. She is not having any right wrist pain. She denies numbness or weakness in her right upper extremity. She denies chest pain, nausea, vomiting, diarrhea, .fever, chills, or any other symptoms at this time. Patient has not already been evaluated for this problem. She has a medical history of COPD and hypertension.   Past Medical History  Diagnosis Date  . COPD (chronic obstructive pulmonary disease)   . Hypertension   . Bipolar 1 disorder   . Anxiety   . Depression    Past Surgical History  Procedure Laterality Date  . Kidney stone surgery    . Cholecystectomy    . Hernia repair     Family History  Problem Relation Age of Onset  . COPD Mother   . Stroke     History  Substance Use Topics  . Smoking status: Current Some Day Smoker  . Smokeless tobacco: Former Systems developer     Comment:  currently using patches in attempt to quick  . Alcohol Use: No   OB History   Grav Para Term Preterm Abortions TAB SAB Ect Mult Living                 Review of Systems  Constitutional: Negative for fever and chills.  Cardiovascular: Negative for chest pain.  Gastrointestinal: Negative for nausea, vomiting and diarrhea.  Musculoskeletal: Positive for arthralgias (right elbow and shoulder pain).  Neurological: Negative for weakness and numbness.  All other systems reviewed and are negative.   Allergies  Penicillins and Septra  Home Medications   Prior to Admission medications   Medication Sig Start Date End Date Taking? Authorizing Provider  albuterol (PROVENTIL HFA;VENTOLIN HFA) 108 (90 BASE) MCG/ACT inhaler Inhale 2 puffs into the lungs every 6 (six) hours as needed for wheezing or shortness of breath.    Historical Provider, MD  albuterol (PROVENTIL) (2.5 MG/3ML) 0.083% nebulizer solution Take 3 mLs (2.5 mg total) by nebulization every 6 (six) hours as needed for wheezing or shortness of breath. 07/12/13   Thurnell Lose, MD  albuterol-ipratropium (COMBIVENT) 18-103 MCG/ACT inhaler Inhale 2 puffs into the lungs every 6 (six) hours as needed for shortness of breath.     Historical Provider, MD  amLODipine (NORVASC) 10 MG tablet Take 1 tablet (  10 mg total) by mouth daily. 07/12/13   Thurnell Lose, MD  estradiol (ESTRACE) 1 MG tablet Take 1 tablet (1 mg total) by mouth daily. 09/11/13   Florian Buff, MD  Fluticasone Furoate-Vilanterol (BREO ELLIPTA) 100-25 MCG/INH AEPB Inhale 1 puff into the lungs daily.    Historical Provider, MD  Fluticasone-Salmeterol (ADVAIR) 250-50 MCG/DOSE AEPB Inhale 1 puff into the lungs every 12 (twelve) hours.    Historical Provider, MD  HYDROcodone-acetaminophen (NORCO) 10-325 MG per tablet Take 1-2 tablets by mouth every 4 (four) hours as needed for moderate pain.    Historical Provider, MD  medroxyPROGESTERone (PROVERA) 2.5 MG tablet Take 1 tablet (2.5 mg  total) by mouth daily. 09/11/13   Florian Buff, MD  metroNIDAZOLE (FLAGYL) 500 MG tablet Take 4 tablets at once, refill for sexual partner 10/25/13   Florian Buff, MD  nicotine (NICODERM CQ - DOSED IN MG/24 HOURS) 14 mg/24hr patch Place 1 patch (14 mg total) onto the skin daily. 07/12/13   Thurnell Lose, MD  theophylline (THEODUR) 100 MG 12 hr tablet Take 100 mg by mouth 2 (two) times daily.    Historical Provider, MD    Triage Vitals: BP 147/59  Pulse 92  Temp(Src) 98.7 F (37.1 C)  Resp 18  Ht 5\' 4"  (1.626 m)  Wt 210 lb (95.255 kg)  BMI 36.03 kg/m2  SpO2 98% Physical Exam CONSTITUTIONAL: Well developed/well nourished HEAD: Normocephalic/atraumatic ENMT: Mucous membranes moist NECK: supple no meningeal signs SPINE:entire spine nontender CV: S1/S2 noted, no murmurs/rubs/gallops noted LUNGS: Lungs are clear to auscultation bilaterally, no apparent distress ABDOMEN: soft, nontender, no rebound or guarding NEURO: Pt is awake/alert, moves all extremitiesx4, patient ambulatory without issue EXTREMITIES: pulses normal, full ROM, tenderness with palpation and range of motion of right shoulder and right elbow, distal motor and sensory intact in right upper extremity, distal pulses intact SKIN: warm, color normal, small abrasion noted to right elbow PSYCH: no abnormalities of mood noted  ED Course  Procedures  COORDINATION OF CARE: 3:18 PM- Patient informed of current plan for treatment and evaluation and agrees with plan at this time.    Pt is able to flex/extend right elbow She is able to abduct right shoulder and touch opposite shoulder with right hand Sling ordered Advised f/u with orthopedics   Imaging Review Dg Shoulder Right  11/30/2013   CLINICAL DATA:  Traumatic injury and pain  EXAM: RIGHT SHOULDER - 2+ VIEW  COMPARISON:  None.  FINDINGS: There is no evidence of fracture or dislocation. There is no evidence of arthropathy or other focal bone abnormality. Soft tissues are  unremarkable.  IMPRESSION: No acute abnormality noted.   Electronically Signed   By: Inez Catalina M.D.   On: 11/30/2013 15:42   Dg Elbow Complete Right  11/30/2013   CLINICAL DATA:  Fall down stairs.  EXAM: RIGHT ELBOW - COMPLETE 3+ VIEW  COMPARISON:  None.  FINDINGS: There is no evidence of fracture, dislocation, or joint effusion. Tiny calcifications at bilateral humeral condyle may reflect enthesopathy, remote injury or calcific tendinopathy. There is no evidence of arthropathy or other focal bone abnormality. Soft tissues are unremarkable.  IMPRESSION: No acute fracture deformity or dislocation.   Electronically Signed   By: Elon Alas   On: 11/30/2013 15:43     MDM   Final diagnoses:  Sprain of right shoulder  Sprain of right elbow    Nursing notes including past medical history and social history reviewed and considered  in documentation xrays reviewed and considered   I personally performed the services described in this documentation, which was scribed in my presence. The recorded information has been reviewed and is accurate.      Sharyon Cable, MD 11/30/13 [id number]

## 2013-11-30 NOTE — ED Notes (Signed)
Pt states she fell from the height of two stairs on Sunday, landed on her elbow. Pain has since gotten progressively worse, with limited ROM today.

## 2013-12-06 ENCOUNTER — Other Ambulatory Visit: Payer: Self-pay

## 2013-12-06 ENCOUNTER — Ambulatory Visit (HOSPITAL_COMMUNITY)
Admission: RE | Admit: 2013-12-06 | Discharge: 2013-12-06 | Disposition: A | Discharge status: Home / Self Care | Payer: Medicaid Other | Source: Ambulatory Visit | Attending: Pulmonary Disease | Admitting: Pulmonary Disease

## 2013-12-06 DIAGNOSIS — R110 Nausea: Secondary | ICD-10-CM | POA: Diagnosis present

## 2013-12-06 DIAGNOSIS — K2990 Gastroduodenitis, unspecified, without bleeding: Secondary | ICD-10-CM | POA: Diagnosis not present

## 2013-12-06 DIAGNOSIS — K2970 Gastritis, unspecified, without bleeding: Secondary | ICD-10-CM | POA: Insufficient documentation

## 2013-12-06 LAB — PULMONARY FUNCTION TEST
DL/VA % PRED: 100 %
DL/VA: 4.84 ml/min/mmHg/L
DLCO cor % pred: 60 %
DLCO cor: 14.75 ml/min/mmHg
DLCO unc % pred: 60 %
DLCO unc: 14.71 ml/min/mmHg
FEF 25-75 Post: 2.19 L/sec
FEF 25-75 Pre: 1.59 L/sec
FEF2575-%Change-Post: 37 %
FEF2575-%PRED-POST: 90 %
FEF2575-%Pred-Pre: 66 %
FEV1-%CHANGE-POST: 8 %
FEV1-%PRED-POST: 60 %
FEV1-%Pred-Pre: 55 %
FEV1-POST: 1.58 L
FEV1-Pre: 1.46 L
FEV1FVC-%CHANGE-POST: 0 %
FEV1FVC-%Pred-Pre: 105 %
FEV6-%Change-Post: 9 %
FEV6-%PRED-PRE: 54 %
FEV6-%Pred-Post: 59 %
FEV6-PRE: 1.76 L
FEV6-Post: 1.93 L
FEV6FVC-%PRED-POST: 103 %
FEV6FVC-%PRED-PRE: 103 %
FVC-%CHANGE-POST: 9 %
FVC-%PRED-PRE: 52 %
FVC-%Pred-Post: 57 %
FVC-Post: 1.93 L
PRE FEV1/FVC RATIO: 82 %
Post FEV1/FVC ratio: 82 %
Post FEV6/FVC ratio: 100 %
Pre FEV6/FVC Ratio: 100 %
RV % pred: 99 %
RV: 1.96 L
TLC % PRED: 73 %
TLC: 3.73 L

## 2013-12-06 LAB — BLOOD GAS, ARTERIAL
Acid-Base Excess: 0.9 mmol/L (ref 0.0–2.0)
Bicarbonate: 24.8 mEq/L — ABNORMAL HIGH (ref 20.0–24.0)
FIO2: 0.21 %
O2 Saturation: 94.3 %
PCO2 ART: 38.8 mmHg (ref 35.0–45.0)
PH ART: 7.423 (ref 7.350–7.450)
Patient temperature: 37
TCO2: 22 mmol/L (ref 0–100)
pO2, Arterial: 68.1 mmHg — ABNORMAL LOW (ref 80.0–100.0)

## 2013-12-06 MED ORDER — ALBUTEROL SULFATE (2.5 MG/3ML) 0.083% IN NEBU
2.5000 mg | INHALATION_SOLUTION | Freq: Once | RESPIRATORY_TRACT | Status: AC
  Administered 2013-12-06: 2.5 mg via RESPIRATORY_TRACT

## 2013-12-12 ENCOUNTER — Encounter (INDEPENDENT_AMBULATORY_CARE_PROVIDER_SITE_OTHER): Payer: Self-pay | Admitting: Neurology

## 2013-12-12 DIAGNOSIS — J9811 Atelectasis: Secondary | ICD-10-CM

## 2013-12-12 DIAGNOSIS — G4733 Obstructive sleep apnea (adult) (pediatric): Secondary | ICD-10-CM

## 2013-12-12 DIAGNOSIS — J449 Chronic obstructive pulmonary disease, unspecified: Secondary | ICD-10-CM

## 2013-12-12 DIAGNOSIS — M545 Low back pain, unspecified: Secondary | ICD-10-CM

## 2013-12-12 DIAGNOSIS — Z0289 Encounter for other administrative examinations: Secondary | ICD-10-CM

## 2013-12-13 ENCOUNTER — Ambulatory Visit: Payer: Medicaid Other | Admitting: Obstetrics & Gynecology

## 2013-12-17 ENCOUNTER — Other Ambulatory Visit (HOSPITAL_COMMUNITY): Payer: Self-pay | Admitting: Pulmonary Disease

## 2013-12-17 ENCOUNTER — Telehealth: Payer: Self-pay | Admitting: Orthopedic Surgery

## 2013-12-17 ENCOUNTER — Ambulatory Visit (HOSPITAL_COMMUNITY)
Admission: RE | Admit: 2013-12-17 | Discharge: 2013-12-17 | Disposition: A | Discharge status: Home / Self Care | Payer: Medicaid Other | Source: Ambulatory Visit | Attending: Pulmonary Disease | Admitting: Pulmonary Disease

## 2013-12-17 DIAGNOSIS — J189 Pneumonia, unspecified organism: Secondary | ICD-10-CM

## 2013-12-18 NOTE — Telephone Encounter (Signed)
Noted, regarding patient obtaining referral from primary care physician prior to scheduling Emergency room follow up visit.

## 2013-12-21 ENCOUNTER — Telehealth: Payer: Self-pay | Admitting: Neurology

## 2013-12-21 NOTE — Telephone Encounter (Signed)
Please call and notify the patient that the recent sleep study did not show any significant obstructive sleep apnea. However, she was noted to have significant oxygen desaturation, which is likely because of underlying lung disease And her recent history of pneumonia. In addition, she was noted to have significant leg kicking in sleep, which can be seen with restless leg syndrome. Please inform patient that I would like to go over the details of the study during a follow up appointment and if not already previously scheduled, arrange a followup appointment (please utilize a followu-up slot). Also, route or fax report to PCP and referring MD, if other than PCP.  Once you have spoken to patient, you can close this encounter.   Thanks,  Star Age, MD, PhD [facility])

## 2013-12-24 ENCOUNTER — Encounter: Payer: Self-pay | Admitting: *Deleted

## 2013-12-24 ENCOUNTER — Encounter: Payer: Self-pay | Admitting: Neurology

## 2013-12-24 NOTE — Telephone Encounter (Signed)
I called and left a message for the patient about her recents sleep study results. I informed the patient that the study did not show any significant obstructive sleep apnea, but it did show significant oxygen desaturation, which is likely due to underlying lung disease and her recent history of pneumonia. Dr. Rexene Alberts would like to discuss the results in detail during a follow visit. I will fax a copy of the report to Dr. Purcell Nails Fusco's office.

## 2013-12-28 NOTE — Telephone Encounter (Signed)
Referral was received, patient was contacted 12/26/13, and appointment has been scheduled, patient aware.

## 2013-12-31 ENCOUNTER — Emergency Department (HOSPITAL_COMMUNITY)
Admission: EM | Admit: 2013-12-31 | Discharge: 2013-12-31 | Disposition: A | Discharge status: Home / Self Care | Payer: Medicaid Other | Attending: Emergency Medicine | Admitting: Emergency Medicine

## 2013-12-31 ENCOUNTER — Encounter (HOSPITAL_COMMUNITY): Payer: Self-pay | Admitting: Emergency Medicine

## 2013-12-31 DIAGNOSIS — F3289 Other specified depressive episodes: Secondary | ICD-10-CM | POA: Insufficient documentation

## 2013-12-31 DIAGNOSIS — F17200 Nicotine dependence, unspecified, uncomplicated: Secondary | ICD-10-CM | POA: Insufficient documentation

## 2013-12-31 DIAGNOSIS — G8929 Other chronic pain: Secondary | ICD-10-CM | POA: Insufficient documentation

## 2013-12-31 DIAGNOSIS — F329 Major depressive disorder, single episode, unspecified: Secondary | ICD-10-CM | POA: Insufficient documentation

## 2013-12-31 DIAGNOSIS — J4489 Other specified chronic obstructive pulmonary disease: Secondary | ICD-10-CM | POA: Insufficient documentation

## 2013-12-31 DIAGNOSIS — Z880 Allergy status to penicillin: Secondary | ICD-10-CM | POA: Insufficient documentation

## 2013-12-31 DIAGNOSIS — I10 Essential (primary) hypertension: Secondary | ICD-10-CM | POA: Insufficient documentation

## 2013-12-31 DIAGNOSIS — R110 Nausea: Secondary | ICD-10-CM

## 2013-12-31 DIAGNOSIS — Z9089 Acquired absence of other organs: Secondary | ICD-10-CM | POA: Insufficient documentation

## 2013-12-31 DIAGNOSIS — F411 Generalized anxiety disorder: Secondary | ICD-10-CM | POA: Insufficient documentation

## 2013-12-31 DIAGNOSIS — J449 Chronic obstructive pulmonary disease, unspecified: Secondary | ICD-10-CM | POA: Insufficient documentation

## 2013-12-31 DIAGNOSIS — K2970 Gastritis, unspecified, without bleeding: Secondary | ICD-10-CM | POA: Insufficient documentation

## 2013-12-31 DIAGNOSIS — K2990 Gastroduodenitis, unspecified, without bleeding: Principal | ICD-10-CM

## 2013-12-31 DIAGNOSIS — Z79899 Other long term (current) drug therapy: Secondary | ICD-10-CM | POA: Insufficient documentation

## 2013-12-31 LAB — CBC WITH DIFFERENTIAL/PLATELET
Basophils Absolute: 0 10*3/uL (ref 0.0–0.1)
Basophils Relative: 0 % (ref 0–1)
Eosinophils Absolute: 0.2 10*3/uL (ref 0.0–0.7)
Eosinophils Relative: 3 % (ref 0–5)
HEMATOCRIT: 39.5 % (ref 36.0–46.0)
Hemoglobin: 13.8 g/dL (ref 12.0–15.0)
LYMPHS PCT: 41 % (ref 12–46)
Lymphs Abs: 3.1 10*3/uL (ref 0.7–4.0)
MCH: 32.4 pg (ref 26.0–34.0)
MCHC: 34.9 g/dL (ref 30.0–36.0)
MCV: 92.7 fL (ref 78.0–100.0)
MONO ABS: 0.6 10*3/uL (ref 0.1–1.0)
MONOS PCT: 8 % (ref 3–12)
NEUTROS ABS: 3.5 10*3/uL (ref 1.7–7.7)
Neutrophils Relative %: 48 % (ref 43–77)
Platelets: 265 10*3/uL (ref 150–400)
RBC: 4.26 MIL/uL (ref 3.87–5.11)
RDW: 14.6 % (ref 11.5–15.5)
WBC: 7.5 10*3/uL (ref 4.0–10.5)

## 2013-12-31 LAB — PROTIME-INR
INR: 1.03 (ref 0.00–1.49)
Prothrombin Time: 13.5 seconds (ref 11.6–15.2)

## 2013-12-31 LAB — COMPREHENSIVE METABOLIC PANEL
ALT: 19 U/L (ref 0–35)
AST: 17 U/L (ref 0–37)
Albumin: 3.7 g/dL (ref 3.5–5.2)
Alkaline Phosphatase: 99 U/L (ref 39–117)
BUN: 6 mg/dL (ref 6–23)
CHLORIDE: 100 meq/L (ref 96–112)
CO2: 24 meq/L (ref 19–32)
CREATININE: 0.61 mg/dL (ref 0.50–1.10)
Calcium: 9.7 mg/dL (ref 8.4–10.5)
GFR calc Af Amer: >90 mL/min (ref >90)
GFR calc non Af Amer: >90 mL/min (ref >90)
Glucose, Bld: 111 mg/dL — ABNORMAL HIGH (ref 70–99)
Potassium: 3.2 mEq/L — ABNORMAL LOW (ref 3.7–5.3)
Sodium: 140 mEq/L (ref 137–147)
Total Protein: 7.1 g/dL (ref 6.0–8.3)

## 2013-12-31 LAB — URINALYSIS, ROUTINE W REFLEX MICROSCOPIC
BILIRUBIN URINE: NEGATIVE
Glucose, UA: NEGATIVE mg/dL
Hgb urine dipstick: NEGATIVE
Ketones, ur: NEGATIVE mg/dL
Leukocytes, UA: NEGATIVE
Nitrite: NEGATIVE
Protein, ur: NEGATIVE mg/dL
Specific Gravity, Urine: <1.005 — ABNORMAL LOW (ref 1.005–1.030)
UROBILINOGEN UA: 0.2 mg/dL (ref 0.0–1.0)
pH: 5.5 (ref 5.0–8.0)

## 2013-12-31 LAB — LIPASE, BLOOD: Lipase: 21 U/L (ref 11–59)

## 2013-12-31 MED ORDER — SUCRALFATE 1 G PO TABS
ORAL_TABLET | ORAL | Status: AC
  Filled 2013-12-31: qty 1

## 2013-12-31 MED ORDER — SUCRALFATE 1 G PO TABS: 1.0000 g | ORAL_TABLET | Freq: Four times a day (QID) | ORAL | Status: DC

## 2013-12-31 MED ORDER — ONDANSETRON HCL 4 MG/2ML IJ SOLN
4.0000 mg | Freq: Once | INTRAMUSCULAR | Status: AC
  Administered 2013-12-31: 4 mg via INTRAVENOUS
  Filled 2013-12-31: qty 2

## 2013-12-31 MED ORDER — SUCRALFATE 1 G PO TABS
1.0000 g | ORAL_TABLET | Freq: Three times a day (TID) | ORAL | Status: DC
  Administered 2013-12-31: 1 g via ORAL
  Filled 2013-12-31 (×7): qty 1

## 2013-12-31 MED ORDER — ONDANSETRON 4 MG PO TBDP: 4.0000 mg | ORAL_TABLET | Freq: Three times a day (TID) | ORAL | Status: DC | PRN

## 2013-12-31 NOTE — ED Notes (Signed)
PT c/o n/v/d x3 days. PT stated she vomited x3 today and had 3 loose stools. PT denies any abdominal pain at this time. PT has appt with gastrointerologist on 01/17/14

## 2013-12-31 NOTE — ED Notes (Signed)
Report given to Virgina Jock., RN

## 2013-12-31 NOTE — Discharge Instructions (Signed)

## 2013-12-31 NOTE — ED Provider Notes (Signed)
CSN: [id number]     Arrival date & time 12/31/13  1816 History   First MD Initiated Contact with Patient 12/31/13 1846     Chief Complaint  Patient presents with  . Emesis     ) HPI  Patient presents with epigastric pain and burning and vomiting nausea for last 3-4 days. No fever. No hematemesis. No dark stools or melena. History of cholecystectomy. No fevers no chills. No chest pain or difficulty breathing. No dysuria frequency hematuria. Feels weak and "dehydrated" today   Past Medical History  Diagnosis Date  . COPD (chronic obstructive pulmonary disease)   . Hypertension   . Bipolar 1 disorder   . Anxiety   . Depression    Past Surgical History  Procedure Laterality Date  . Kidney stone surgery    . Cholecystectomy    . Hernia repair     Family History  Problem Relation Age of Onset  . COPD Mother   . Stroke     History  Substance Use Topics  . Smoking status: Current Some Day Smoker  . Smokeless tobacco: Former Systems developer     Comment: currently using patches in attempt to quick  . Alcohol Use: No   OB History   Grav Para Term Preterm Abortions TAB SAB Ect Mult Living                 Review of Systems  Constitutional: Negative for fever, chills, diaphoresis, appetite change and fatigue.  HENT: Negative for mouth sores, sore throat and trouble swallowing.   Eyes: Negative for visual disturbance.  Respiratory: Negative for cough, chest tightness, shortness of breath and wheezing.   Cardiovascular: Negative for chest pain.  Gastrointestinal: Positive for nausea, vomiting and abdominal pain. Negative for diarrhea and abdominal distention.  Endocrine: Negative for polydipsia, polyphagia and polyuria.  Genitourinary: Negative for dysuria, frequency and hematuria.  Musculoskeletal: Negative for gait problem.  Skin: Negative for color change, pallor and rash.  Neurological: Negative for dizziness, syncope, light-headedness and headaches.  Hematological: Does not  bruise/bleed easily.  Psychiatric/Behavioral: Negative for behavioral problems and confusion.      Allergies  Penicillins and Septra  Home Medications   Prior to Admission medications   Medication Sig Start Date End Date Taking? Authorizing Provider  albuterol (PROVENTIL HFA;VENTOLIN HFA) 108 (90 BASE) MCG/ACT inhaler Inhale 2 puffs into the lungs every 6 (six) hours as needed for wheezing or shortness of breath.   Yes Historical Provider, MD  albuterol-ipratropium (COMBIVENT) 18-103 MCG/ACT inhaler Inhale 2 puffs into the lungs every 6 (six) hours as needed for shortness of breath.    Yes Historical Provider, MD  ALPRAZolam Duanne Moron) 1 MG tablet Take 1 mg by mouth 4 (four) times daily as needed for anxiety.   Yes Historical Provider, MD  amLODipine (NORVASC) 10 MG tablet Take 1 tablet (10 mg total) by mouth daily. 07/12/13  Yes Thurnell Lose, MD  estradiol (ESTRACE) 1 MG tablet Take 1 tablet (1 mg total) by mouth daily. 09/11/13  Yes Florian Buff, MD  Fluticasone Furoate-Vilanterol (BREO ELLIPTA) 100-25 MCG/INH AEPB Inhale 1 puff into the lungs daily.   Yes Historical Provider, MD  HYDROcodone-acetaminophen (NORCO) 10-325 MG per tablet Take 1 tablet by mouth every 6 (six) hours as needed for moderate pain or severe pain.   Yes Historical Provider, MD  medroxyPROGESTERone (PROVERA) 2.5 MG tablet Take 1 tablet (2.5 mg total) by mouth daily. 09/11/13  Yes Florian Buff, MD  theophylline (THEODUR) 100  MG 12 hr tablet Take 100 mg by mouth daily.    Yes Historical Provider, MD  albuterol (PROVENTIL) (2.5 MG/3ML) 0.083% nebulizer solution Take 3 mLs (2.5 mg total) by nebulization every 6 (six) hours as needed for wheezing or shortness of breath. 07/12/13   Thurnell Lose, MD  ondansetron (ZOFRAN ODT) 4 MG disintegrating tablet Take 1 tablet (4 mg total) by mouth every 8 (eight) hours as needed for nausea. 12/31/13   Tanna Furry, MD  sucralfate (CARAFATE) 1 G tablet Take 1 tablet (1 g total) by mouth 4  (four) times daily. 12/31/13   Tanna Furry, MD   BP 105/59  Pulse 88  Temp(Src) 97.8 F (36.6 C) (Oral)  Resp 18  Ht 5\' 4"  (1.626 m)  Wt 209 lb (94.802 kg)  BMI 35.86 kg/m2  SpO2 94% Physical Exam  Constitutional: She is oriented to person, place, and time. She appears well-developed and well-nourished. No distress.  HENT:  Head: Normocephalic.  Eyes: Conjunctivae are normal. Pupils are equal, round, and reactive to light. No scleral icterus.  Neck: Normal range of motion. Neck supple. No thyromegaly present.  Cardiovascular: Normal rate and regular rhythm.  Exam reveals no gallop and no friction rub.   No murmur heard. Pulmonary/Chest: Effort normal and breath sounds normal. No respiratory distress. She has no wheezes. She has no rales.  Abdominal: Soft. Bowel sounds are normal. She exhibits no distension. There is no tenderness. There is no rebound.  Benign abdomen. No guarding rebound or peritoneal irritation.  Musculoskeletal: Normal range of motion.  Neurological: She is alert and oriented to person, place, and time.  Skin: Skin is warm and dry. No rash noted.  Psychiatric: She has a normal mood and affect. Her behavior is normal.    ED Course  Procedures (including critical care time) Labs Review Labs Reviewed  COMPREHENSIVE METABOLIC PANEL - Abnormal; Notable for the following:    Potassium 3.2 (*)    Glucose, Bld 111 (*)    Total Bilirubin <0.2 (*)    All other components within normal limits  URINALYSIS, ROUTINE W REFLEX MICROSCOPIC - Abnormal; Notable for the following:    Specific Gravity, Urine <1.005 (*)    All other components within normal limits  CBC WITH DIFFERENTIAL  PROTIME-INR  LIPASE, BLOOD    Imaging Review No results found.   EKG Interpretation None      MDM   Final diagnoses:  Nausea  Gastritis   Labs are reassuring. Nausea is improved. She has multiple risk for gastritis or ulcers. She uses anti-inflammatories for her chronic painful  shoulder, she continues to smoke, she drinks intermittently. She drinks caffeine daily. Advised to avoid these 4 things. Take her Prilosec twice a day. Prescription for Zofran, and Carafate. Primary care followup with her primary care physician Dr. Gerarda Fraction, if not improving.    Tanna Furry, MD 12/31/13 2039

## 2013-12-31 NOTE — ED Notes (Signed)
Nausea for 3 days , Has vomited x2, loose stools, no fever.

## 2014-01-02 ENCOUNTER — Telehealth: Payer: Self-pay | Admitting: Neurology

## 2014-01-02 NOTE — Telephone Encounter (Signed)
Pt returning call.  Says that it is hard for her to find transportation to and from [place].  She says that phone call notes that she did not have sleep apnea.  Says that she already has a pulmonologist to assist with her COPD and she will also follow up with Dr. Gerarda Fraction.  Does not wish to schedule a follow up at this time.

## 2014-01-07 ENCOUNTER — Ambulatory Visit: Payer: Medicaid Other | Admitting: Orthopedic Surgery

## 2014-01-14 ENCOUNTER — Other Ambulatory Visit: Payer: Self-pay | Admitting: Internal Medicine

## 2014-01-21 ENCOUNTER — Telehealth: Payer: Self-pay | Admitting: Gastroenterology

## 2014-01-21 ENCOUNTER — Ambulatory Visit: Payer: Medicaid Other | Admitting: Gastroenterology

## 2014-01-21 NOTE — Telephone Encounter (Signed)
Pt LMOM that she did not have transportation to get to her [place]

## 2014-01-21 NOTE — Telephone Encounter (Signed)
No show X 2 since 11/2013.

## 2014-02-04 ENCOUNTER — Encounter: Payer: Self-pay | Admitting: Orthopedic Surgery

## 2014-02-04 ENCOUNTER — Ambulatory Visit: Payer: Medicaid Other | Admitting: Orthopedic Surgery

## 2014-02-21 ENCOUNTER — Ambulatory Visit: Payer: Medicaid Other | Admitting: Orthopedic Surgery

## 2014-02-22 ENCOUNTER — Encounter: Payer: Self-pay | Admitting: Orthopedic Surgery

## 2014-03-06 ENCOUNTER — Encounter (HOSPITAL_COMMUNITY): Payer: Self-pay | Admitting: Emergency Medicine

## 2014-03-06 ENCOUNTER — Emergency Department (HOSPITAL_COMMUNITY): Payer: Medicaid Other

## 2014-03-06 ENCOUNTER — Observation Stay (HOSPITAL_COMMUNITY)
Admission: EM | Admit: 2014-03-06 | Discharge: 2014-03-08 | Disposition: A | Discharge status: Home / Self Care | Payer: Medicaid Other | Attending: Family Medicine | Admitting: Family Medicine

## 2014-03-06 DIAGNOSIS — R0789 Other chest pain: Principal | ICD-10-CM | POA: Insufficient documentation

## 2014-03-06 DIAGNOSIS — J811 Chronic pulmonary edema: Secondary | ICD-10-CM

## 2014-03-06 DIAGNOSIS — R079 Chest pain, unspecified: Secondary | ICD-10-CM | POA: Diagnosis present

## 2014-03-06 DIAGNOSIS — Z880 Allergy status to penicillin: Secondary | ICD-10-CM | POA: Insufficient documentation

## 2014-03-06 DIAGNOSIS — R0989 Other specified symptoms and signs involving the circulatory and respiratory systems: Secondary | ICD-10-CM | POA: Diagnosis present

## 2014-03-06 DIAGNOSIS — F17200 Nicotine dependence, unspecified, uncomplicated: Secondary | ICD-10-CM | POA: Diagnosis not present

## 2014-03-06 DIAGNOSIS — K219 Gastro-esophageal reflux disease without esophagitis: Secondary | ICD-10-CM | POA: Diagnosis present

## 2014-03-06 DIAGNOSIS — F411 Generalized anxiety disorder: Secondary | ICD-10-CM | POA: Insufficient documentation

## 2014-03-06 DIAGNOSIS — F319 Bipolar disorder, unspecified: Secondary | ICD-10-CM | POA: Diagnosis present

## 2014-03-06 DIAGNOSIS — F3130 Bipolar disorder, current episode depressed, mild or moderate severity, unspecified: Secondary | ICD-10-CM | POA: Diagnosis not present

## 2014-03-06 DIAGNOSIS — I10 Essential (primary) hypertension: Secondary | ICD-10-CM | POA: Diagnosis present

## 2014-03-06 DIAGNOSIS — J441 Chronic obstructive pulmonary disease with (acute) exacerbation: Secondary | ICD-10-CM | POA: Diagnosis not present

## 2014-03-06 DIAGNOSIS — F410 Panic disorder [episodic paroxysmal anxiety] without agoraphobia: Secondary | ICD-10-CM | POA: Diagnosis present

## 2014-03-06 DIAGNOSIS — E785 Hyperlipidemia, unspecified: Secondary | ICD-10-CM | POA: Diagnosis present

## 2014-03-06 DIAGNOSIS — Z79899 Other long term (current) drug therapy: Secondary | ICD-10-CM | POA: Diagnosis not present

## 2014-03-06 DIAGNOSIS — J449 Chronic obstructive pulmonary disease, unspecified: Secondary | ICD-10-CM | POA: Diagnosis present

## 2014-03-06 LAB — CBC
HCT: 42.3 % (ref 36.0–46.0)
Hemoglobin: 15.1 g/dL — ABNORMAL HIGH (ref 12.0–15.0)
MCH: 34.2 pg — ABNORMAL HIGH (ref 26.0–34.0)
MCHC: 35.7 g/dL (ref 30.0–36.0)
MCV: 95.9 fL (ref 78.0–100.0)
PLATELETS: 234 10*3/uL (ref 150–400)
RBC: 4.41 MIL/uL (ref 3.87–5.11)
RDW: 13.6 % (ref 11.5–15.5)
WBC: 8.8 10*3/uL (ref 4.0–10.5)

## 2014-03-06 LAB — PRO B NATRIURETIC PEPTIDE: Pro B Natriuretic peptide (BNP): 102.5 pg/mL (ref 0–125)

## 2014-03-06 LAB — BASIC METABOLIC PANEL
ANION GAP: 13 (ref 5–15)
BUN: 9 mg/dL (ref 6–23)
CHLORIDE: 102 meq/L (ref 96–112)
CO2: 24 mEq/L (ref 19–32)
Calcium: 9.7 mg/dL (ref 8.4–10.5)
Creatinine, Ser: 0.52 mg/dL (ref 0.50–1.10)
Glucose, Bld: 132 mg/dL — ABNORMAL HIGH (ref 70–99)
POTASSIUM: 3.6 meq/L — AB (ref 3.7–5.3)
SODIUM: 139 meq/L (ref 137–147)

## 2014-03-06 LAB — TROPONIN I

## 2014-03-06 MED ORDER — MORPHINE SULFATE 2 MG/ML IJ SOLN
2.0000 mg | INTRAMUSCULAR | Status: DC | PRN
  Administered 2014-03-06 – 2014-03-07 (×3): 2 mg via INTRAVENOUS
  Filled 2014-03-06 (×3): qty 1

## 2014-03-06 MED ORDER — NITROGLYCERIN 2 % TD OINT
1.0000 [in_us] | TOPICAL_OINTMENT | Freq: Once | TRANSDERMAL | Status: AC
  Administered 2014-03-06: 1 [in_us] via TOPICAL
  Filled 2014-03-06: qty 1

## 2014-03-06 MED ORDER — ALPRAZOLAM 1 MG PO TABS
1.0000 mg | ORAL_TABLET | Freq: Four times a day (QID) | ORAL | Status: DC | PRN
  Administered 2014-03-06 – 2014-03-08 (×4): 1 mg via ORAL
  Filled 2014-03-06 (×5): qty 1

## 2014-03-06 MED ORDER — ALBUTEROL SULFATE (2.5 MG/3ML) 0.083% IN NEBU: 2.5000 mg | INHALATION_SOLUTION | Freq: Four times a day (QID) | RESPIRATORY_TRACT | Status: DC | PRN

## 2014-03-06 MED ORDER — MOMETASONE FURO-FORMOTEROL FUM 100-5 MCG/ACT IN AERO
2.0000 | INHALATION_SPRAY | Freq: Two times a day (BID) | RESPIRATORY_TRACT | Status: DC
  Administered 2014-03-06 – 2014-03-08 (×4): 2 via RESPIRATORY_TRACT
  Filled 2014-03-06: qty 8.8

## 2014-03-06 MED ORDER — POTASSIUM CHLORIDE CRYS ER 20 MEQ PO TBCR
40.0000 meq | EXTENDED_RELEASE_TABLET | Freq: Once | ORAL | Status: AC
  Administered 2014-03-06: 40 meq via ORAL
  Filled 2014-03-06: qty 2

## 2014-03-06 MED ORDER — NITROGLYCERIN 0.4 MG SL SUBL
0.4000 mg | SUBLINGUAL_TABLET | SUBLINGUAL | Status: DC | PRN
  Administered 2014-03-06: 0.4 mg via SUBLINGUAL
  Filled 2014-03-06: qty 1

## 2014-03-06 MED ORDER — MEDROXYPROGESTERONE ACETATE 5 MG PO TABS
2.5000 mg | ORAL_TABLET | Freq: Every day | ORAL | Status: DC
  Administered 2014-03-06 – 2014-03-08 (×3): 2.5 mg via ORAL
  Filled 2014-03-06 (×6): qty 1

## 2014-03-06 MED ORDER — ACETAMINOPHEN 325 MG PO TABS
650.0000 mg | ORAL_TABLET | ORAL | Status: DC | PRN
  Administered 2014-03-07: 650 mg via ORAL
  Filled 2014-03-06: qty 2

## 2014-03-06 MED ORDER — PANTOPRAZOLE SODIUM 40 MG PO TBEC
40.0000 mg | DELAYED_RELEASE_TABLET | Freq: Every day | ORAL | Status: DC
  Administered 2014-03-07 – 2014-03-08 (×2): 40 mg via ORAL
  Filled 2014-03-06 (×2): qty 1

## 2014-03-06 MED ORDER — FLUTICASONE FUROATE-VILANTEROL 100-25 MCG/INH IN AEPB: 1.0000 | INHALATION_SPRAY | Freq: Every day | RESPIRATORY_TRACT | Status: DC

## 2014-03-06 MED ORDER — SUCRALFATE 1 G PO TABS
1.0000 g | ORAL_TABLET | Freq: Four times a day (QID) | ORAL | Status: DC
  Administered 2014-03-06 – 2014-03-08 (×8): 1 g via ORAL
  Filled 2014-03-06 (×8): qty 1

## 2014-03-06 MED ORDER — ONDANSETRON HCL 4 MG/2ML IJ SOLN
4.0000 mg | Freq: Once | INTRAMUSCULAR | Status: AC | PRN
  Administered 2014-03-06: 4 mg via INTRAVENOUS
  Filled 2014-03-06: qty 2

## 2014-03-06 MED ORDER — ONDANSETRON HCL 4 MG/2ML IJ SOLN: 4.0000 mg | Freq: Four times a day (QID) | INTRAMUSCULAR | Status: DC | PRN

## 2014-03-06 MED ORDER — ENOXAPARIN SODIUM 40 MG/0.4ML ~~LOC~~ SOLN
40.0000 mg | SUBCUTANEOUS | Status: DC
  Administered 2014-03-06 – 2014-03-07 (×2): 40 mg via SUBCUTANEOUS
  Filled 2014-03-06 (×2): qty 0.4

## 2014-03-06 MED ORDER — MORPHINE SULFATE 4 MG/ML IJ SOLN
4.0000 mg | INTRAMUSCULAR | Status: DC | PRN
  Administered 2014-03-06: 4 mg via INTRAVENOUS
  Filled 2014-03-06: qty 1

## 2014-03-06 MED ORDER — AMLODIPINE BESYLATE 5 MG PO TABS
10.0000 mg | ORAL_TABLET | Freq: Every day | ORAL | Status: DC
  Administered 2014-03-07 – 2014-03-08 (×2): 10 mg via ORAL
  Filled 2014-03-06 (×2): qty 2

## 2014-03-06 MED ORDER — THEOPHYLLINE ER 200 MG PO TB12: 100.0000 mg | ORAL_TABLET | Freq: Every day | ORAL | Status: DC

## 2014-03-06 MED ORDER — METOPROLOL TARTRATE 25 MG PO TABS
25.0000 mg | ORAL_TABLET | Freq: Two times a day (BID) | ORAL | Status: DC
  Administered 2014-03-06 – 2014-03-07 (×2): 25 mg via ORAL
  Filled 2014-03-06 (×2): qty 1

## 2014-03-06 MED ORDER — ASPIRIN 81 MG PO CHEW
81.0000 mg | CHEWABLE_TABLET | Freq: Every day | ORAL | Status: DC
  Administered 2014-03-07 – 2014-03-08 (×2): 81 mg via ORAL
  Filled 2014-03-06 (×2): qty 1

## 2014-03-06 MED ORDER — IPRATROPIUM-ALBUTEROL 18-103 MCG/ACT IN AERO: 2.0000 | INHALATION_SPRAY | Freq: Four times a day (QID) | RESPIRATORY_TRACT | Status: DC | PRN

## 2014-03-06 MED ORDER — ASPIRIN 81 MG PO CHEW
324.0000 mg | CHEWABLE_TABLET | Freq: Once | ORAL | Status: AC
  Administered 2014-03-06: 324 mg via ORAL
  Filled 2014-03-06: qty 4

## 2014-03-06 MED ORDER — HYDROCODONE-ACETAMINOPHEN 10-325 MG PO TABS
1.0000 | ORAL_TABLET | Freq: Four times a day (QID) | ORAL | Status: DC | PRN
  Administered 2014-03-08 (×2): 1 via ORAL
  Filled 2014-03-06 (×2): qty 1

## 2014-03-06 MED ORDER — ESTRADIOL 1 MG PO TABS
1.0000 mg | ORAL_TABLET | Freq: Every day | ORAL | Status: DC
  Administered 2014-03-07 – 2014-03-08 (×2): 1 mg via ORAL
  Filled 2014-03-06 (×5): qty 1

## 2014-03-06 MED ORDER — IPRATROPIUM-ALBUTEROL 0.5-2.5 (3) MG/3ML IN SOLN: 3.0000 mL | Freq: Four times a day (QID) | RESPIRATORY_TRACT | Status: DC | PRN

## 2014-03-06 NOTE — H&P (Addendum)
[facility] History and Physical  [name] ZDG:[id number] DOB: 08-25-1955 DOA: 03/06/2014  Referring physician: Dr. Tanna Furry PCP: Glo Herring., MD   Chief Complaint: Chest pain  HPI:  Review[age]-year-old female with history of COPD, hypertension, panic disorder, dyslipidemia who presented to the ED with chest pain  off and on for last 1-2 weeks . Patient reports having central chest heaviness about one week back lasted for several minutes and blood reviewed after taking her husband's sublingual nitroglycerin . She did have similar substernal chest pain which was pressure-like 10/10 in severity and nonradiating lasting for several minutes and went to a doctor she took the nitroglycerin. This morning she again had similar chest pain and took one nitroglycerin which did not relieve her pain so she came to the ED. She reports shortness of breath but thinks this is related to her COPD, reports palpitations (but says she has panic attacks), denies any chest trauma, denies headache, dizziness, fever, chills, nausea, vomiting, abdominal pain, bowel or urinary symptoms.  Denies changes medications. Denies change in her weight or appetite. She reports noticing some leg swellings with her primary care had her on Lasix as well.  Course in the ED  In the ED patient was given but did not improve chest pain, she was then given nitroglycerine patch  and was given 4 mg IV morphine after which chest pain resolved. EKG was unremarkable. Initial troponin was negative. ProBNP was normal. CBC and basic metabolic panel is normal as well. Chest x-ray showed LVH and pulmonary vascular congestion Her heart score was 3 and hospitalists consulted for admission and observation   Review of Systems:  Constitutional: Denies fever, chills, diaphoresis, appetite change and fatigue.  HEENT: Denies , congestion,  trouble swallowing, neck pain, Respiratory: Dyspnea on exertion, Denies  cough, chest  tightness,  and wheezing.   Cardiovascular:  chest pain, palpitations and leg swelling.  Gastrointestinal: Denies nausea, vomiting, abdominal pain, diarrhea,  blood in stool and abdominal distention.  Genitourinary: Denies dysuria, urgency, frequency, hematuria, flank pain   Endocrine: Denies: hot or cold intolerance,  polyuria, polydipsia. Musculoskeletal: Denies myalgias, back pain, joint swelling, arthralgias   Neurological: Denies dizziness,  syncope, weakness, light-headedness, numbness and headaches.  Psychiatric/Behavioral: Denies  confusion, nervousness,   Past Medical History  Diagnosis Date  . COPD (chronic obstructive pulmonary disease)   . Hypertension   . Bipolar 1 disorder   . Anxiety   . Depression    Past Surgical History  Procedure Laterality Date  . Kidney stone surgery    . Cholecystectomy    . Hernia repair     Social History:  reports that she has been smoking Cigarettes.  She has been smoking about 0.00 packs per day. She has quit using smokeless tobacco. She reports that she does not drink alcohol or use illicit drugs.  Allergies  Allergen Reactions  . Penicillins Other (See Comments)    REACTION: Unknown  . Septra [Sulfamethoxazole-Trimethoprim] Other (See Comments)    REACTION: Unknown    Family History  Problem Relation Age of Onset  . COPD Mother   . Stroke      Prior to Admission medications   Medication Sig Start Date End Date Taking? Authorizing Provider  albuterol (PROVENTIL HFA;VENTOLIN HFA) 108 (90 BASE) MCG/ACT inhaler Inhale 2 puffs into the lungs every 6 (six) hours as needed for wheezing or shortness of breath.   Yes Historical Provider, MD  albuterol (PROVENTIL) (2.5 MG/3ML) 0.083% nebulizer solution Take 3 mLs (2.5  mg total) by nebulization every 6 (six) hours as needed for wheezing or shortness of breath. 07/12/13  Yes Thurnell Lose, MD  albuterol-ipratropium (COMBIVENT) 18-103 MCG/ACT inhaler Inhale 2 puffs into the lungs every 6  (six) hours as needed for shortness of breath.    Yes Historical Provider, MD  ALPRAZolam Duanne Moron) 1 MG tablet Take 1 mg by mouth 4 (four) times daily as needed for anxiety.   Yes Historical Provider, MD  amLODipine (NORVASC) 10 MG tablet Take 1 tablet (10 mg total) by mouth daily. 07/12/13  Yes Thurnell Lose, MD  estradiol (ESTRACE) 1 MG tablet Take 1 tablet (1 mg total) by mouth daily. 09/11/13  Yes Florian Buff, MD  Fluticasone Furoate-Vilanterol (BREO ELLIPTA) 100-25 MCG/INH AEPB Inhale 1 puff into the lungs daily.   Yes Historical Provider, MD  HYDROcodone-acetaminophen (NORCO) 10-325 MG per tablet Take 1 tablet by mouth every 6 (six) hours as needed for moderate pain or severe pain.   Yes Historical Provider, MD  medroxyPROGESTERone (PROVERA) 2.5 MG tablet Take 1 tablet (2.5 mg total) by mouth daily. 09/11/13  Yes Florian Buff, MD  omeprazole (PRILOSEC) 20 MG capsule Take 20 mg by mouth 2 (two) times daily before a meal.   Yes Historical Provider, MD  sucralfate (CARAFATE) 1 G tablet Take 1 tablet (1 g total) by mouth 4 (four) times daily. 12/31/13  Yes Tanna Furry, MD  theophylline (THEODUR) 100 MG 12 hr tablet Take 100 mg by mouth daily.    Yes Historical Provider, MD     Physical Exam:  Filed Vitals:   03/06/14 1400 03/06/14 1405 03/06/14 1410 03/06/14 1415  BP: 147/79 119/73 125/72 126/75  Pulse: 84 95 87 84  Temp:      TempSrc:      Resp: 23 24 19 17   Height:      Weight:      SpO2: 97% 93% 94% 95%    Constitutional: Vital signs reviewed.  It is a female in no acute distress  HEENT: no pallor, no icterus, moist oral mucosa,  Cardiovascular: RRR, S1 normal, S2 normal, no MRG Chest: CTAB, fine left lung base basilar crackles, no rhonchi wheeze Abdominal: Soft. Non-tender, non-distended, bowel sounds are normal,  Ext: warm , trace leg edema  Neurological: Alert and oriented  Labs on Admission:  Basic Metabolic Panel:  Recent Labs Lab 03/06/14 1211  NA 139  K 3.6*  CL  102  CO2 24  GLUCOSE 132*  BUN 9  CREATININE 0.52  CALCIUM 9.7   Liver Function Tests: No results found for this basename: AST, ALT, ALKPHOS, BILITOT, PROT, ALBUMIN,  in the last 168 hours No results found for this basename: LIPASE, AMYLASE,  in the last 168 hours No results found for this basename: AMMONIA,  in the last 168 hours CBC:  Recent Labs Lab 03/06/14 1211  WBC 8.8  HGB 15.1*  HCT 42.3  MCV 95.9  PLT 234   Cardiac Enzymes:  Recent Labs Lab 03/06/14 1211  TROPONINI <0.30   BNP: No components found with this basename: POCBNP,  CBG: No results found for this basename: GLUCAP,  in the last 168 hours  Radiological Exams on Admission: Dg Chest Port 1 View  03/06/2014   CLINICAL DATA:  Chest pain, COPD, former smoker, hypertension  EXAM: PORTABLE CHEST - 1 VIEW  COMPARISON:  Portable exam 1227 hr compared 12/17/2013  FINDINGS: Minimal enlargement of cardiac silhouette with pulmonary vascular congestion.  Mediastinal contours normal.  No definite acute infiltrate, pleural effusion or pneumothorax.  Bones unremarkable.  IMPRESSION: Mild enlargement of cardiac silhouette with pulmonary vascular congestion.  No definite acute infiltrate.   Electronically Signed   By: Lavonia Dana M.D.   On: 03/06/2014 12:38    EKG: Normal sinus rhythm, no ST-T changes  Assessment/Plan   Principal Problem:   Chest pain at rest Admit to telemetry under observation HEART score of 3.  continue asa 81 mg daily, s/l nitro prn and prn IV morphine for chest pain. -Admit about her. Check lipid panel. -Check 2-D echo to evaluate LV function. cardiology consult to evaluate for inpatient stress test -N.p.o. after midnight  Active Problems:  ? CHF Patient has bilateral test lead edema and left lung base crackles chest x-ray showing mild pulmonary vascular congestion. I will place her on Lasix 40 mg daily. ProBNP was unremarkable. Amlodipine can contribute to leg swelling as well. follow 2-D  echo    COPD (chronic obstructive pulmonary disease) Counseled on smoking cessation. Oxygen as needed. Continue home inhalers and med    Panic disorder Continue Xanax    Tobacco use disorder Counseled on smoking cessation. Reports Smoking 1-2 cigarettes every few  days. Has more than 60 pack year smoking history.  Hypertension Continue amlodipine    Hyperlipidemia Not on any medication. Check lipid panel    GERD (gastroesophageal reflux disease) Continue PPI     Diet:cardiac  DVT prophylaxis: sq lovenox   Code Status: full code Family Communication: discussed with her husband at bedside Disposition Plan: [place], [place] [facility] Pager [phone]  Total time spent on admission :50 minutes  If 7PM-7AM, please contact night-coverage [email or web address] Password [email or web address] 03/06/2014, 3:35 PM

## 2014-03-06 NOTE — ED Notes (Signed)
Gave coke

## 2014-03-06 NOTE — ED Notes (Signed)
Report given to [facility], all questions answered.

## 2014-03-06 NOTE — ED Notes (Signed)
Patient c/o of intermittent left sided chest pain, pt reports pain comes at rest and with activity. Pt reports taking three of her husbands nitroglyn yesterday and one this am about "1-1.5 hours" ago, per patient chest pain "eased up and then started back".

## 2014-03-06 NOTE — ED Provider Notes (Addendum)
CSN: [id number]     Arrival date & time 03/06/14  1154 History   First MD Initiated Contact with Patient 03/06/14 1251    This chart was scribed for Tanna Furry, MD by Edison Simon, ED Scribe. This patient was seen in room A303/A303-01 and the patient's care was started at 1:07 PM.    Chief Complaint  Patient presents with  . Chest Pain   The history is provided by the patient. No language interpreter was used.   HPI Comments: [name] is a 58 y.o. female who presents to the Emergency Department complaining of intermittent sudden chest pressure with onset 1 week ago with associated shortness of breath and shaking. She states the episodes last 10-15 minutes. She states she took 3 doses of her husband's nitroglycerin yesterday, with a few minutes in between, with mild relief after each dose and remission of symptoms after the third. She reports another episode of chest pressure this morning, for which she took 1 dose of nitro with some relief of symptoms, but states she is still experiencing some chest pressure at this time. She reports history of COPD, hypertension, and borderline diabetes. She states she no longer smokes regularly. The patient denies diaphoresis, dizziness, or lightheadedness.  She reports a previous occurrence of intermittent chest pain 4-5 months ago for which she came to this emergency department, but she does not report any significant cardiac test results.    Past Medical History  Diagnosis Date  . COPD (chronic obstructive pulmonary disease)   . Hypertension   . Bipolar 1 disorder   . Anxiety   . Depression    Past Surgical History  Procedure Laterality Date  . Kidney stone surgery    . Cholecystectomy    . Hernia repair     Family History  Problem Relation Age of Onset  . COPD Mother   . Stroke     History  Substance Use Topics  . Smoking status: Current Some Day Smoker    Types: Cigarettes  . Smokeless tobacco: Former Systems developer     Comment:  currently using patches in attempt to quick  . Alcohol Use: No   OB History   Grav Para Term Preterm Abortions TAB SAB Ect Mult Living                 Review of Systems  Constitutional: Negative for fever, chills, diaphoresis, appetite change and fatigue.  HENT: Negative for mouth sores, sore throat and trouble swallowing.   Eyes: Negative for visual disturbance.  Respiratory: Positive for shortness of breath. Negative for cough, chest tightness and wheezing.   Cardiovascular: Positive for chest pain (pressure).  Gastrointestinal: Negative for nausea, vomiting, abdominal pain, diarrhea and abdominal distention.  Endocrine: Negative for polydipsia, polyphagia and polyuria.  Genitourinary: Negative for dysuria, frequency and hematuria.  Musculoskeletal: Negative for gait problem.  Skin: Negative for color change, pallor and rash.  Neurological: Negative for dizziness, syncope, light-headedness and headaches.  Hematological: Does not bruise/bleed easily.  Psychiatric/Behavioral: Negative for behavioral problems and confusion.      Allergies  Penicillins and Septra  Home Medications   Prior to Admission medications   Medication Sig Start Date End Date Taking? Authorizing Provider  albuterol (PROVENTIL HFA;VENTOLIN HFA) 108 (90 BASE) MCG/ACT inhaler Inhale 2 puffs into the lungs every 6 (six) hours as needed for wheezing or shortness of breath.   Yes Historical Provider, MD  albuterol (PROVENTIL) (2.5 MG/3ML) 0.083% nebulizer solution Take 3 mLs (2.5 mg total)  by nebulization every 6 (six) hours as needed for wheezing or shortness of breath. 07/12/13  Yes Thurnell Lose, MD  albuterol-ipratropium (COMBIVENT) 18-103 MCG/ACT inhaler Inhale 2 puffs into the lungs every 6 (six) hours as needed for shortness of breath.    Yes Historical Provider, MD  ALPRAZolam Duanne Moron) 1 MG tablet Take 1 mg by mouth 4 (four) times daily as needed for anxiety.   Yes Historical Provider, MD  amLODipine  (NORVASC) 10 MG tablet Take 1 tablet (10 mg total) by mouth daily. 07/12/13  Yes Thurnell Lose, MD  estradiol (ESTRACE) 1 MG tablet Take 1 tablet (1 mg total) by mouth daily. 09/11/13  Yes Florian Buff, MD  Fluticasone Furoate-Vilanterol (BREO ELLIPTA) 100-25 MCG/INH AEPB Inhale 1 puff into the lungs daily.   Yes Historical Provider, MD  HYDROcodone-acetaminophen (NORCO) 10-325 MG per tablet Take 1 tablet by mouth every 6 (six) hours as needed for moderate pain or severe pain.   Yes Historical Provider, MD  medroxyPROGESTERone (PROVERA) 2.5 MG tablet Take 1 tablet (2.5 mg total) by mouth daily. 09/11/13  Yes Florian Buff, MD  omeprazole (PRILOSEC) 20 MG capsule Take 20 mg by mouth 2 (two) times daily before a meal.   Yes Historical Provider, MD  sucralfate (CARAFATE) 1 G tablet Take 1 tablet (1 g total) by mouth 4 (four) times daily. 12/31/13  Yes Tanna Furry, MD  theophylline (THEODUR) 100 MG 12 hr tablet Take 100 mg by mouth daily.    Yes Historical Provider, MD   BP 122/74  Pulse 70  Temp(Src) 98.5 F (36.9 C) (Oral)  Resp 20  Ht 5\' 4"  (1.626 m)  Wt 216 lb 11.4 oz (98.3 kg)  BMI 37.18 kg/m2  SpO2 98% Physical Exam  Nursing note and vitals reviewed. Constitutional: She is oriented to person, place, and time. She appears well-developed and well-nourished. No distress.  HENT:  Head: Normocephalic.  Eyes: Conjunctivae are normal. Pupils are equal, round, and reactive to light. No scleral icterus.  Neck: Normal range of motion. Neck supple. No thyromegaly present.  Cardiovascular: Normal rate, regular rhythm and normal heart sounds.  Exam reveals no gallop and no friction rub.   No murmur heard. Pulmonary/Chest: Effort normal and breath sounds normal. No respiratory distress. She has no wheezes. She has no rales.  Abdominal: Soft. Bowel sounds are normal. She exhibits no distension. There is no tenderness. There is no rebound.  Musculoskeletal: Normal range of motion.  Neurological: She is  alert and oriented to person, place, and time.  Skin: Skin is warm and dry. No rash noted.  Psychiatric: She has a normal mood and affect. Her behavior is normal.    ED Course  Procedures (including critical care time) Labs Review Labs Reviewed  CBC - Abnormal; Notable for the following:    Hemoglobin 15.1 (*)    MCH 34.2 (*)    All other components within normal limits  BASIC METABOLIC PANEL - Abnormal; Notable for the following:    Potassium 3.6 (*)    Glucose, Bld 132 (*)    All other components within normal limits  LIPID PANEL - Abnormal; Notable for the following:    Triglycerides 201 (*)    All other components within normal limits  PRO B NATRIURETIC PEPTIDE  TROPONIN I  TROPONIN I  TROPONIN I  TROPONIN I    Imaging Review Dg Chest Port 1 View  03/06/2014   CLINICAL DATA:  Chest pain, COPD, former smoker, hypertension  EXAM: PORTABLE CHEST - 1 VIEW  COMPARISON:  Portable exam 1227 hr compared 12/17/2013  FINDINGS: Minimal enlargement of cardiac silhouette with pulmonary vascular congestion.  Mediastinal contours normal.  No definite acute infiltrate, pleural effusion or pneumothorax.  Bones unremarkable.  IMPRESSION: Mild enlargement of cardiac silhouette with pulmonary vascular congestion.  No definite acute infiltrate.   Electronically Signed   By: Lavonia Dana M.D.   On: 03/06/2014 12:38     EKG Interpretation   Date/Time:  Wednesday March 06 2014 12:02:15 EDT Ventricular Rate:  97 PR Interval:  170 QRS Duration: 85 QT Interval:  362 QTC Calculation: 460 R Axis:   85 Text Interpretation:  Sinus rhythm Baseline wander in lead(s) V1 V6  Confirmed by Jeneen Rinks  MD, Sp[place] ([id number]) on 03/06/2014 12:50:54 PM     DIAGNOSTIC STUDIES: Oxygen Saturation is 94% on room air, adequate by my interpretation.    COORDINATION OF CARE:    MDM   Final diagnoses:  Chest pain, unspecified chest pain type     Patient reports pressure-like chest pain improved with  nitroglycerin. Admitted for rule out in February. Has never had a mild scan or any specific cardiac testing. EKG shows no acute changes, although wandering baseline. EKG repeat is pending. Troponin normal. Given additional SL nitroglycerin here. Minimal pain persisted. Given morphine and Zofran and is now pain-free. I have asked for nitroglycerin paste. Think she should be admitted for ACS rule out. Will likely need additional testing upon rule out.   Tanna Furry, MD 03/06/14 [place], MD 03/07/14 [phone]

## 2014-03-06 NOTE — ED Notes (Signed)
MD at bedside. 

## 2014-03-06 NOTE — ED Notes (Signed)
Nitro subling and Morphine 4 mg given for chest pain 5/10.

## 2014-03-06 NOTE — ED Notes (Signed)
Pt reports being pain free at this time.

## 2014-03-07 DIAGNOSIS — R0789 Other chest pain: Secondary | ICD-10-CM | POA: Diagnosis not present

## 2014-03-07 DIAGNOSIS — I517 Cardiomegaly: Secondary | ICD-10-CM

## 2014-03-07 DIAGNOSIS — R079 Chest pain, unspecified: Secondary | ICD-10-CM | POA: Diagnosis not present

## 2014-03-07 DIAGNOSIS — I10 Essential (primary) hypertension: Secondary | ICD-10-CM | POA: Diagnosis not present

## 2014-03-07 DIAGNOSIS — F411 Generalized anxiety disorder: Secondary | ICD-10-CM | POA: Diagnosis not present

## 2014-03-07 LAB — LIPID PANEL
CHOL/HDL RATIO: 3.1 ratio
CHOLESTEROL: 166 mg/dL (ref 0–200)
HDL: 53 mg/dL (ref >39)
LDL Cholesterol: 73 mg/dL (ref 0–99)
Triglycerides: 201 mg/dL — ABNORMAL HIGH (ref <150)
VLDL: 40 mg/dL (ref 0–40)

## 2014-03-07 LAB — TROPONIN I
Troponin I: <0.3 ng/mL (ref <0.30)
Troponin I: <0.3 ng/mL (ref <0.30)

## 2014-03-07 MED ORDER — FUROSEMIDE 10 MG/ML IJ SOLN
20.0000 mg | Freq: Once | INTRAMUSCULAR | Status: AC
  Administered 2014-03-07: 20 mg via INTRAVENOUS
  Filled 2014-03-07: qty 2

## 2014-03-07 NOTE — Progress Notes (Signed)
UR Completed.  [phone]  

## 2014-03-07 NOTE — Progress Notes (Signed)
Patient seen and discussed with NP Purcell Nails, I agree with her documentation. 58 yo female hx of COPD, HTN, panic disorder, HL admitted with chest pain. Has has intermittent episodes of mild chest pressure 2/10 in mid chest primarily at rest. Mild SOB, no other associated symptoms. Not positional, can be better with NG. Increased frequency over the last 2 weeks. Symptoms last up to 1 hour. Has had some increasing DOE she attributes to her COPD.    CAD risk factors: tobacco, HTN, HL EKG SR without ischemic changes, trop neg x 3, BNP 102, K 3.6, Cr 0.52, Hgb 15.1, Plt 234 CXR mild pulm congestion, mild cardiomegaly.  No evidence of ACS by EKG or enzymes. Her story is mixed for cardiac chest pain, she does have CAD risk factors. She does have some evidence of mild volume overload based on CXR and exam. F/u echo results, if existing systolic dysfunction would consider invasive ischemic testing. If normal LVEF, likely pursue exercise nuclear stress. Hold theophylline incase she requires regadenoson. Would also need to hold NG patch prior to stress testing. Anticipate echo today, pending results likely inpatient stress tomorrow AM. Restart diet this morning.    Zandra Abts MD

## 2014-03-07 NOTE — Consult Note (Signed)
CARDIOLOGY CONSULT NOTE   Patient ID: [name] MRN: [id number] DOB/AGE: 1955/07/29 58 y.o.  Admit Date: 03/06/2014 Referring Physician: PTH Primary Physician: Glo Herring., MD Consulting Cardiologist: Carlyle Dolly MD Primary Cardiologist: Einar Gip  Reason for Consultation: Chest Pain  Clinical Summary [name] is a 58 y.o.female with known history of COPD, polysubstance abuse in the past, psychiatric mood disorder, hypertension, and dyslipidemia. Had cardiac cath in 2008 demonstrating normal coronaries. She was in her usual state of health until two days ago when she began to experience chest pressure after getting up in the am. She was watching television. " I just didn't fee right in my chest."  She took 3 of her husband's NTG and found relief.  Yesterday she again began to feel chest pressure at rest and again took NTG. She became worried and anxious about the discomfort and presented to ER.   On arrival to ER she was found to be hypertensive with BP 153/83, HR 95, O2 sat 94%. EKG demonstrated NSR. Cardiac enzymes are negative X 3. CXR demonstrated cardiac enlargement with pulmonary vascular congestion.  Potassium 3.6. She was given NTG SL and NTG paste was placed, morphine. Potassium was repleted. She was placed on po lasix. An echo has been ordered.   She states that pressure is returning while I am assessing her.She denies radiation, diaphoreses, but endorses some shortness of breath, but she is unsure if it is related to anxiety or chest pressure.   Allergies  Allergen Reactions  . Penicillins Other (See Comments)    REACTION: Unknown  . Septra [Sulfamethoxazole-Trimethoprim] Other (See Comments)    REACTION: Unknown    Medications Scheduled Medications: . amLODipine  10 mg Oral Daily  . aspirin  81 mg Oral Daily  . enoxaparin (LOVENOX) injection  40 mg Subcutaneous Q24H  . estradiol  1 mg Oral Daily  . medroxyPROGESTERone  2.5 mg Oral Daily    . metoprolol tartrate  25 mg Oral BID  . mometasone-formoterol  2 puff Inhalation BID  . pantoprazole  40 mg Oral Daily  . sucralfate  1 g Oral QID  . theophylline  100 mg Oral Daily   PRN Medications: acetaminophen, albuterol, ALPRAZolam, HYDROcodone-acetaminophen, ipratropium-albuterol, morphine injection, morphine injection, nitroGLYCERIN, ondansetron (ZOFRAN) IV   Past Medical History  Diagnosis Date  . COPD (chronic obstructive pulmonary disease)   . Hypertension   . Bipolar 1 disorder   . Anxiety   . Depression     Past Surgical History  Procedure Laterality Date  . Kidney stone surgery    . Cholecystectomy    . Hernia repair      Family History  Problem Relation Age of Onset  . COPD Mother   . Stroke      Social History [name] reports that she has been smoking Cigarettes.  She has been smoking about 0.00 packs per day. She has quit using smokeless tobacco. [name] reports that she does not drink alcohol.  Review of Systems Otherwise reviewed and negative except as outlined.  Physical Examination Blood pressure 120/60, pulse 68, temperature 97.9 F (36.6 C), temperature source Oral, resp. rate 20, height 5\' 4"  (1.626 m), weight 160 lb (72.576 kg), SpO2 95.00%.  Intake/Output Summary (Last 24 hours) at 03/07/14 0802 Last data filed at 03/06/14 1808  Gross per 24 hour  Intake    240 ml  Output      0 ml  Net    240 ml    Telemetry:  NSR  NTI:[id number], complaining of 5/10 chest pain HEENT: Conjunctiva and lids normal, oropharynx clear with moist mucosa. Neck: Supple, no elevated JVP or carotid bruits, no thyromegaly. Lungs: Some crackles in the bases, but no wheezes. Pain is not reproducible.  Cardiac: Regular rate and rhythm, no S3 or significant systolic murmur, no pericardial rub. Abdomen: Soft, nontender, no hepatomegaly, bowel sounds present, no guarding or rebound. Extremities: No pitting edema, distal pulses 2+. Skin: Warm and  dry. Musculoskeletal: No kyphosis. Neuropsychiatric: Alert and oriented x3, affect grossly appropriate.  Prior Cardiac Testing/Procedures  Cardiac Cath 11/17/2006 1. Mild, diffuse luminal irregularity, constituting 10% to 20%  stenosis of the coronary arteries. No significant coronary artery  disease.  2. Normal LV systolic function.  RECOMMENDATIONS: Risk modification, smoking cessation, and lifestyle  modification is indicated. The patient will be discharged home today.   Lab Results  Basic Metabolic Panel:  Recent Labs Lab 03/06/14 1211  NA 139  K 3.6*  CL 102  CO2 24  GLUCOSE 132*  BUN 9  CREATININE 0.52  CALCIUM 9.7     CBC:  Recent Labs Lab 03/06/14 1211  WBC 8.8  HGB 15.1*  HCT 42.3  MCV 95.9  PLT 234    Cardiac Enzymes:  Recent Labs Lab 03/06/14 1211 03/06/14 2146 03/07/14 0343  TROPONINI <0.30 <0.30 <0.30    BNP:102.5  Radiology: Dg Chest Port 1 View  03/06/2014   CLINICAL DATA:  Chest pain, COPD, former smoker, hypertension  EXAM: PORTABLE CHEST - 1 VIEW  COMPARISON:  Portable exam 1227 hr compared 12/17/2013  FINDINGS: Minimal enlargement of cardiac silhouette with pulmonary vascular congestion.  Mediastinal contours normal.  No definite acute infiltrate, pleural effusion or pneumothorax.  Bones unremarkable.  IMPRESSION: Mild enlargement of cardiac silhouette with pulmonary vascular congestion.  No definite acute infiltrate.   Electronically Signed   By: Lavonia Dana M.D.   On: 03/06/2014 12:38     ECG: NSR 67 bpm   Impression and Recommendations  1.Chest Pain: Described as pressure occuring at rest, NTG relieved. Last cath per Dr. Einar Gip in 2008 that demonstrated normal coronary arteries. Cardiac enzymes ar negative X 3. EKG is normal, ruling out ACS. She continues to complain of chest pressure while sitting in bed, requiring morphine, with NTG paste still applied.   Can consider IP/OP stress test to rule out ischemia with CVRF of  hypertension, ongoing tobacco abuse, anxiety,hypercholesterolemia.   Agree with echo, nitrates and antihypertensives. She has appt with our office to be established on Sept 10th.   2. COPD:  Being treated with steroids, theophyline.   3. Anxiety and Depression: Followed by psychiatry in the past.   4. Ongoing tobacco abuse: Cessation is recommended.  Signed: Phill Myron. Catrell Morrone NP  03/07/2014, 8:02 AM Co-Sign MD

## 2014-03-07 NOTE — Progress Notes (Signed)
  Echocardiogram 2D Echocardiogram has been performed.  [place], [place] 03/07/2014, 5:08 PM

## 2014-03-07 NOTE — Care Management Note (Signed)
    Page 1 of 1   03/07/2014     1:52:55 PM CARE MANAGEMENT NOTE 03/07/2014  Patient:  [name]   Account Number:  [id number]  Date Initiated:  03/07/2014  Documentation initiated by:  Theophilus Kinds  Subjective/Objective Assessment:   Pt admitted from home with CP. Pt lives with her husband and will return home at [employer]. Pt is independent with ADL's. Pt PCP is Dr. Gerarda Fraction.     Action/Plan:   No CM needs noted.   Anticipated DC Date:  03/08/2014   Anticipated DC Plan:  [place]  CM consult      Choice offered to / List presented to:             Status of service:  Completed, signed off Medicare Important Message given?   (If response is "NO", the following Medicare IM given date fields will be blank) Date Medicare IM given:   Medicare IM given by:   Date Additional Medicare IM given:   Additional Medicare IM given by:    Discharge Disposition:  HOME/SELF CARE  Per UR Regulation:    If discussed at Long Length of Stay Meetings, dates discussed:    Comments:  03/07/14 [place], RN BSN CM

## 2014-03-07 NOTE — Progress Notes (Signed)
I agree with the History/assesment & plan per Midlevel provider as per above Further details as follows:-             58 y/o ?, h/o COPD [Last PFT 9/11 showed combined obstructive/restricitve physiology], Bipolar with episodic wrist cutting, prior Cocaine use, Tob abuse, htn, prior non-cadiogenic CP admission 08/2012 [prior cath 2014 showed 10-20% stenosis] admitted to [facility] with central chest heaviness-no relief with nitro.  With patch and IV morphine pain improved-as Heart score 3, patient admitted.  She tells me she had substernal CP 2 days ago and took 3 nitro with improvement and then came back to Ed as she had recurren tCP and took 1 nitro with minimal improvement.  She tells me that she has not recently used cocaine or illicit substances and is eating [place] fires and chicken from [employer]  await Echo consideration to be given for PE as is on Estrogen if CP doesn't resolve would get CT angio chest continue management COPD-no need for steroids/abx currently as not acute exacerbation continue lasix 20 iv-change to PO lasix OD in am-pro-BNP un suggestive of acute CHF etiology Get UDS given prior Cocaine use-may determine choices of medications Has impaired glucose tolerance-Get A1c if blood sugars sustain above 170   Verneita Griffes, MD [facility] ([phone]

## 2014-03-07 NOTE — Progress Notes (Signed)
[facility] PROGRESS NOTE  [name] HKV:[id number] DOB: May 03, 1956 DOA: 03/06/2014 PCP: Glo Herring., MD  Assessment/Plan: Chest pain at rest  No further episodes since early am.  Troponin negative x3, chest xray mild congestion, EKG SR without acute changes. Triglycerides 201. Await echo. Evaluated by cards who opine no evidence of ACS.  Recommending inpatient stress test tomorrow pending echo results. Continue asa 81 mg daily, s/l nitro prn and prn IV morphine for chest pain.   Active Problems:  ? CHF  Patient with bilateral LE edema and left lung base crackles chest x-ray showing mild pulmonary vascular congestion on admission. Await echo. Cards recommending lasix 20mg  IV. Will monitor daily weight and intake and output.   COPD (chronic obstructive pulmonary disease)   stable at baseline. Continue home inhalers and med  Panic disorder  Continue Xanax. Stable at baseline  Tobacco use disorder  Counseled on smoking cessation.    Hypertension   controlled. Continue amlodipine   Hyperlipidemia  Triglyceride 201 otherwise lipid panel within normal limits. Not on any medication. Consider statin   GERD (gastroesophageal reflux disease)  Continue PPI     Code Status: full Family Communication: son at bedside Disposition Plan: home hopefully tomorrow   Consultants:  Dr Harl Bowie cardiology  Procedures:  none  Antibiotics:  none  HPI/Subjective: Ambulating in hall looking for lunch. Gait steady. Denies pain/discomfort  Objective: Filed Vitals:   03/07/14 1054  BP: 113/66  Pulse: 75  Temp:   Resp:     Intake/Output Summary (Last 24 hours) at 03/07/14 1211 Last data filed at 03/06/14 1808  Gross per 24 hour  Intake    240 ml  Output      0 ml  Net    240 ml   Filed Weights   03/06/14 1204  Weight: 72.576 kg (160 lb)    Exam:   General:  Obese appears comfortable  Cardiovascular: RRR No MGR trace LE edema  Respiratory: normal  effort with ambulation. Fine crackles bilateral bases. No wheeeze or rhonchi  Abdomen: obese soft +BS non-tender to palpation  Musculoskeletal: no clubbing or cyanosis   Data Reviewed: Basic Metabolic Panel:  Recent Labs Lab 03/06/14 1211  NA 139  K 3.6*  CL 102  CO2 24  GLUCOSE 132*  BUN 9  CREATININE 0.52  CALCIUM 9.7   Liver Function Tests: No results found for this basename: AST, ALT, ALKPHOS, BILITOT, PROT, ALBUMIN,  in the last 168 hours No results found for this basename: LIPASE, AMYLASE,  in the last 168 hours No results found for this basename: AMMONIA,  in the last 168 hours CBC:  Recent Labs Lab 03/06/14 1211  WBC 8.8  HGB 15.1*  HCT 42.3  MCV 95.9  PLT 234   Cardiac Enzymes:  Recent Labs Lab 03/06/14 1211 03/06/14 2146 03/07/14 0343 03/07/14 0935  TROPONINI <0.30 <0.30 <0.30 <0.30   BNP (last 3 results)  Recent Labs  03/06/14 1211  PROBNP 102.5   CBG: No results found for this basename: GLUCAP,  in the last 168 hours  No results found for this or any previous visit (from the past 240 hour(s)).   Studies: Dg Chest Port 1 View  03/06/2014   CLINICAL DATA:  Chest pain, COPD, former smoker, hypertension  EXAM: PORTABLE CHEST - 1 VIEW  COMPARISON:  Portable exam 1227 hr compared 12/17/2013  FINDINGS: Minimal enlargement of cardiac silhouette with pulmonary vascular congestion.  Mediastinal contours normal.  No definite acute infiltrate, pleural effusion  or pneumothorax.  Bones unremarkable.  IMPRESSION: Mild enlargement of cardiac silhouette with pulmonary vascular congestion.  No definite acute infiltrate.   Electronically Signed   By: Lavonia Dana M.D.   On: 03/06/2014 12:38    Scheduled Meds: . amLODipine  10 mg Oral Daily  . aspirin  81 mg Oral Daily  . enoxaparin (LOVENOX) injection  40 mg Subcutaneous Q24H  . estradiol  1 mg Oral Daily  . medroxyPROGESTERone  2.5 mg Oral Daily  . metoprolol tartrate  25 mg Oral BID  .  mometasone-formoterol  2 puff Inhalation BID  . pantoprazole  40 mg Oral Daily  . sucralfate  1 g Oral QID   Continuous Infusions:   Principal Problem:   Chest pain at rest Active Problems:   COPD (chronic obstructive pulmonary disease)   Panic disorder   Tobacco use disorder   Hyperlipidemia   GERD (gastroesophageal reflux disease)   Essential hypertension   Pulmonary vascular congestion    Time spent: [facility] Pager [phone]. If 7PM-7AM, please contact night-coverage at [email or web address], password [facility] 03/07/2014, 12:11 PM  LOS: 1 day

## 2014-03-08 ENCOUNTER — Encounter (HOSPITAL_COMMUNITY): Payer: Self-pay

## 2014-03-08 DIAGNOSIS — R079 Chest pain, unspecified: Secondary | ICD-10-CM

## 2014-03-08 MED ORDER — ASPIRIN 81 MG PO CHEW: 81.0000 mg | CHEWABLE_TABLET | Freq: Every day | ORAL | Status: DC

## 2014-03-08 MED ORDER — PANTOPRAZOLE SODIUM 40 MG PO TBEC: 40.0000 mg | DELAYED_RELEASE_TABLET | Freq: Two times a day (BID) | ORAL | Status: DC

## 2014-03-08 NOTE — Progress Notes (Signed)
Consulting cardiologist:Daya Dutt. Roderic Palau MD Primary Cardiologist: Einar Gip  Subjective:   No chest pain overnight. She continues to have some periods of anxiety.   Objective:   Temp:  [98.2 F (36.8 C)-98.5 F (36.9 C)] 98.2 F (36.8 C) (09/04 0609) Pulse Rate:  [70-81] 81 (09/04 0609) Resp:  [20] 20 (09/04 0609) BP: (113-127)/(60-74) 127/66 mmHg (09/04 0609) SpO2:  [95 %-98 %] 95 % (09/04 0744) Weight:  [216 lb 11.4 oz (98.3 kg)-216 lb 14.9 oz (98.4 kg)] 216 lb 14.9 oz (98.4 kg) (09/04 0650) Last BM Date: 03/07/14  Filed Weights   03/06/14 1204 03/07/14 1300 03/08/14 0650  Weight: 160 lb (72.576 kg) 216 lb 11.4 oz (98.3 kg) 216 lb 14.9 oz (98.4 kg)   No intake or output data in the 24 hours ending 03/08/14 1017  Telemetry: Normal sinus rhythm.  Exam:  General: No acute distress.  HEENT: Conjunctiva and lids normal, oropharynx clear.  Lungs: Clear to auscultation, nonlabored.  Cardiac: No elevated JVP or bruits. RRR, no gallop or rub.   Abdomen: Normoactive bowel sounds, nontender, nondistended.  Extremities: No pitting edema, distal pulses full.  Neuropsychiatric: Alert and oriented x3, affect appropriate.   Lab Results:  Basic Metabolic Panel:  Recent Labs Lab 03/06/14 1211  NA 139  K 3.6*  CL 102  CO2 24  GLUCOSE 132*  BUN 9  CREATININE 0.52  CALCIUM 9.7    CBC:  Recent Labs Lab 03/06/14 1211  WBC 8.8  HGB 15.1*  HCT 42.3  MCV 95.9  PLT 234    Cardiac Enzymes:  Recent Labs Lab 03/06/14 2146 03/07/14 0343 03/07/14 0935  TROPONINI <0.30 <0.30 <0.30    BNP:  Recent Labs  03/06/14 1211  PROBNP 102.5    Radiology: Dg Chest Port 1 View  03/06/2014   CLINICAL DATA:  Chest pain, COPD, former smoker, hypertension  EXAM: PORTABLE CHEST - 1 VIEW  COMPARISON:  Portable exam 1227 hr compared 12/17/2013  FINDINGS: Minimal enlargement of cardiac silhouette with pulmonary vascular congestion.  Mediastinal contours normal.  No definite  acute infiltrate, pleural effusion or pneumothorax.  Bones unremarkable.  IMPRESSION: Mild enlargement of cardiac silhouette with pulmonary vascular congestion.  No definite acute infiltrate.   Electronically Signed   By: Lavonia Dana M.D.   On: 03/06/2014 12:38      Medications:   Scheduled Medications: . amLODipine  10 mg Oral Daily  . aspirin  81 mg Oral Daily  . enoxaparin (LOVENOX) injection  40 mg Subcutaneous Q24H  . estradiol  1 mg Oral Daily  . medroxyPROGESTERone  2.5 mg Oral Daily  . mometasone-formoterol  2 puff Inhalation BID  . pantoprazole  40 mg Oral Daily  . sucralfate  1 g Oral QID       PRN Medications: acetaminophen, albuterol, ALPRAZolam, HYDROcodone-acetaminophen, ipratropium-albuterol, morphine injection, morphine injection, nitroGLYCERIN, ondansetron (ZOFRAN) IV   Assessment and Plan:   1.Chest Pain:  No  further complaints of chest pain. Stress test completed this am and was negative for ischemic changes. She did have hypertensive response. No chest pain during stress test. Will be officially read by Dr. Harl Bowie. May be discharged today if stable once stress test is resulted.   2. COPD: Breathing status stable. No wheezes or coughing during the stress test.   3. Ongoing tobacco abuse:  Cessation is reinforced.    Phill Myron. Lawrence NP  03/08/2014, 10:17 AM  Attending Note Patient seen and discussed with NP Lawrence this AM. Stress test  without ischemic changes, she reached her target heart rate but does have below average functional capacity (likely multifactorial including COPD, deconditioning, obesity). Echo with normal LVEF, no significant abnormal findings. Chest pain appears to be non-cardiac. No further cardiac testing planned at this time, she may follow up with NP Purcell Nails in 3 weeks after discharge. Will sign off of inpatient care.   Zandra Abts MD

## 2014-03-08 NOTE — Progress Notes (Signed)
Pt IV removed w/o difficulty.  Discharge teaching done with teachback to pt and husband.  Pt educated on chest pain , and when to call 911.  Pt advised of appointments to keep post discharge.  Pt taken via wheelchair to entrance to be met by husband and car.  All questions answered.

## 2014-03-08 NOTE — Discharge Summary (Signed)
Physician Discharge Summary  [name] VQM:[id number] DOB: 1955/12/28 DOA: 03/06/2014  PCP: Glo Herring., MD  Admit date: 03/06/2014 Discharge date: 03/08/2014  Time spent: 40 minutes  Recommendations for Outpatient Follow-up:  1.  patient has anxiety and will probably need a psychiatrist to help manage this 2. She might benefit from being on a low-dose beta blocker 3. Unclear why she is on theophylline-would consider discontinuation or weaning   Discharge Diagnoses:  Principal Problem:   Chest pain at rest Active Problems:   COPD (chronic obstructive pulmonary disease)   Panic disorder   Bipolar disorder   Tobacco use disorder   Hyperlipidemia   GERD (gastroesophageal reflux disease)   Essential hypertension   Pulmonary vascular congestion   Discharge Condition:  fair  Diet recommendation:  healthy  Filed Weights   03/06/14 1204 03/07/14 1300 03/08/14 0650  Weight: 72.576 kg (160 lb) 98.3 kg (216 lb 11.4 oz) 98.4 kg (216 lb 14.9 oz)    History of present illness:  58 y/o ?, h/o COPD [Last PFT 9/11 showed combined obstructive/restricitve physiology], Bipolar with episodic wrist cutting, prior Cocaine use, Tob abuse, htn, prior non-cadiogenic CP admission 08/2012 [prior cath 2014 showed 10-20% stenosis] admitted to [facility] with central chest heaviness-no relief with nitro. With patch and IV morphine pain improved-as Heart score 3, patient admitted. She tells me she had substernal CP 2 days ago and took 3 nitro with improvement and then came back to Ed as she had recurren tCP and took 1 nitro with minimal improvement. She tells me that she has not recently used cocaine or illicit substances and is eating [place] fires and chicken from [employer]   patient he was admitted and ruled out by cardiac enzymes and subsequently underwent DEXA scan stress test.  Stress test did not show any specific wall motion abnormalities noted echocardiogram Echocardiogram on admission showed  an EF of 60-65 sent with mild LVH  she did not seem to have any concerns for congestive heart failure on this admission although was given IV Lasix this was discontinued and she does not need this in the outpatient setting.  her COPD was stable during hospital stay and I recommend that she hardly get discontinued off of theophylline. She continues to smoke and would benefit from smoking cessation classes or  Smoking cessation hotline   Procedures:  stress test-"Patient exercised according to the Bruce protocol for 3 mins, achieving work level of 4.6 METs. Resting heart rate increased from 80 bpm up to 141 bpm, representing 87% of THR. Blood pressure increased from 131/81 up to 199/86 with exercise. The test was stopped due to SOB, she did not experience any chest pain.  Baseline EKG showed NSR. There were no specific ischemic changes with exercise and no significant arrhythmias.  Conclusions  1. Negative stress EKG for ischemia  2. Below average exercise functional capacity"   echo "Study Conclusions  - Left ventricle: The cavity size was normal. Wall thickness was increased in a pattern of mild LVH. Systolic function was normal. The estimated ejection fraction was in the range of 60% to 65%. Wall motion was normal; there were no regional wall motion abnormalities. - Technically adequate study."   Consultations:   cardiology  Discharge Exam: Filed Vitals:   03/08/14 1429  BP: 125/62  Pulse: 81  Temp: 98 F (36.7 C)  Resp: 20    General:  Alert pleasant oriented slightly anxious Cardiovascular:  S1-S2 no murmur rub or gallop Respiratory:  Clinically clear  Discharge Instructions You were cared for by a hospitalist during your hospital stay. If you have any questions about your discharge medications or the care you received while you were in the hospital after you are discharged, you can call the unit and asked to speak with the hospitalist on call if the hospitalist that  took care of you is not available. Once you are discharged, your primary care physician will handle any further medical issues. Please note that NO REFILLS for any discharge medications will be authorized once you are discharged, as it is imperative that you return to your primary care physician (or establish a relationship with a primary care physician if you do not have one) for your aftercare needs so that they can reassess your need for medications and monitor your lab values.  Discharge Instructions   Diet - low sodium heart healthy    Complete by:  As directed      Discharge instructions    Complete by:  As directed   Please followup with her regular physician for consideration of coming down off of her Xanax or adding another medication as you have  Bipolar disease  please followup with cardiology-  Min year admitted and during her hospital stay we felt that your findings in keeping with reflux and anxiety more so than anything cardiac in your tests did not reveal any issues  I would recommend you get a psychiatrist to help with your management of her anxiety.  continue aspirin 81 daily which was started this hospital stay this prevents heart attacks and  Over theage of 59     Increase activity slowly    Complete by:  As directed           Current Discharge Medication List    START taking these medications   Details  aspirin 81 MG chewable tablet Chew 1 tablet (81 mg total) by mouth daily. Qty: 30 tablet, Refills: 0    pantoprazole (PROTONIX) 40 MG tablet Take 1 tablet (40 mg total) by mouth 2 (two) times daily. Qty: 60 tablet, Refills: 0      CONTINUE these medications which have NOT CHANGED   Details  albuterol (PROVENTIL HFA;VENTOLIN HFA) 108 (90 BASE) MCG/ACT inhaler Inhale 2 puffs into the lungs every 6 (six) hours as needed for wheezing or shortness of breath.    albuterol (PROVENTIL) (2.5 MG/3ML) 0.083% nebulizer solution Take 3 mLs (2.5 mg total) by nebulization every 6  (six) hours as needed for wheezing or shortness of breath. Qty: 75 mL, Refills: 12    albuterol-ipratropium (COMBIVENT) 18-103 MCG/ACT inhaler Inhale 2 puffs into the lungs every 6 (six) hours as needed for shortness of breath.     ALPRAZolam (XANAX) 1 MG tablet Take 1 mg by mouth 4 (four) times daily as needed for anxiety.    amLODipine (NORVASC) 10 MG tablet Take 1 tablet (10 mg total) by mouth daily. Qty: 30 tablet, Refills: 2    estradiol (ESTRACE) 1 MG tablet Take 1 tablet (1 mg total) by mouth daily. Qty: 30 tablet, Refills: 11    Fluticasone Furoate-Vilanterol (BREO ELLIPTA) 100-25 MCG/INH AEPB Inhale 1 puff into the lungs daily.    HYDROcodone-acetaminophen (NORCO) 10-325 MG per tablet Take 1 tablet by mouth every 6 (six) hours as needed for moderate pain or severe pain.    medroxyPROGESTERone (PROVERA) 2.5 MG tablet Take 1 tablet (2.5 mg total) by mouth daily. Qty: 30 tablet, Refills: 11    sucralfate (CARAFATE) 1 G tablet  Take 1 tablet (1 g total) by mouth 4 (four) times daily. Qty: 60 tablet, Refills: 0    theophylline (THEODUR) 100 MG 12 hr tablet Take 100 mg by mouth daily.       STOP taking these medications     omeprazole (PRILOSEC) 20 MG capsule        Allergies  Allergen Reactions  . Penicillins Other (See Comments)    REACTION: Unknown  . Septra [Sulfamethoxazole-Trimethoprim] Other (See Comments)    REACTION: Unknown   Follow-up Information   Follow up with Jory Sims, NP On 03/29/2014. (at 1:10 pm)    Specialty:  Nurse Practitioner   Contact information:   [address] [place] [address] [phone]        The results of significant diagnostics from this hospitalization (including imaging, microbiology, ancillary and laboratory) are listed below for reference.    Significant Diagnostic Studies: Dg Chest Port 1 View  03/06/2014   CLINICAL DATA:  Chest pain, COPD, former smoker, hypertension  EXAM: PORTABLE CHEST - 1 VIEW   COMPARISON:  Portable exam 1227 hr compared 12/17/2013  FINDINGS: Minimal enlargement of cardiac silhouette with pulmonary vascular congestion.  Mediastinal contours normal.  No definite acute infiltrate, pleural effusion or pneumothorax.  Bones unremarkable.  IMPRESSION: Mild enlargement of cardiac silhouette with pulmonary vascular congestion.  No definite acute infiltrate.   Electronically Signed   By: Lavonia Dana M.D.   On: 03/06/2014 12:38    Microbiology: No results found for this or any previous visit (from the past 240 hour(s)).   Labs: Basic Metabolic Panel:  Recent Labs Lab 03/06/14 1211  NA 139  K 3.6*  CL 102  CO2 24  GLUCOSE 132*  BUN 9  CREATININE 0.52  CALCIUM 9.7   Liver Function Tests: No results found for this basename: AST, ALT, ALKPHOS, BILITOT, PROT, ALBUMIN,  in the last 168 hours No results found for this basename: LIPASE, AMYLASE,  in the last 168 hours No results found for this basename: AMMONIA,  in the last 168 hours CBC:  Recent Labs Lab 03/06/14 1211  WBC 8.8  HGB 15.1*  HCT 42.3  MCV 95.9  PLT 234   Cardiac Enzymes:  Recent Labs Lab 03/06/14 1211 03/06/14 2146 03/07/14 0343 03/07/14 0935  TROPONINI <0.30 <0.30 <0.30 <0.30   BNP: BNP (last 3 results)  Recent Labs  03/06/14 1211  PROBNP 102.5   CBG: No results found for this basename: GLUCAP,  in the last 168 hours     Signed:  Nita Sells  [facility] 03/08/2014, 3:04 PM

## 2014-03-08 NOTE — Progress Notes (Addendum)
Stress Lab Nurses Notes - [place] 03/08/2014 Reason for doing test: Chest Pain Type of test: Regular GTX / Inpatient Rm 303 Nurse performing test: Gerrit Halls, RN Nuclear Medicine Tech: Not Applicable Echo Tech: Not Applicable MD performing test: Branch/K.Purcell Nails NP Family MD: Gerarda Fraction Test explained and consent signed: Yes.   IV started: No redness or edema and Saline lock from floor Symptoms: SOB & dizziness Treatment/Intervention: None Reason test stopped: fatigue and reached target HR After recovery IV was: No redness or edema Patient to return to Nuc. Med at : NA Patient discharged: Transported back to room 303 via wc Patient's Condition upon discharge was: stable Comments: During test peak BP 199/86 & HR 141.  Recovery BP 152/83 & HR 85.  Symptoms resolved in recovery. [name]  Patient exercised according to the Bruce protocol for 3 mins, achieving work level of 4.6 METs. Resting heart rate increased from 80 bpm up to 141 bpm, representing 87% of THR. Blood pressure increased from 131/81 up to 199/86 with exercise. The test was stopped due to SOB, she did not experience any chest pain.   Baseline EKG showed NSR. There were no specific ischemic changes with exercise and no significant arrhythmias.  Conclusions 1. Negative stress EKG for ischemia 2. Below average exercise functional capacity   Rebekah Abts MD

## 2014-03-08 NOTE — Progress Notes (Signed)
Received report from Aldrich, RN, introduced self to pt, addressed needs.  Bed in lowest position, call bell in reach.  Will continue to monitor.

## 2014-03-13 ENCOUNTER — Emergency Department (HOSPITAL_COMMUNITY)
Admission: EM | Admit: 2014-03-13 | Discharge: 2014-03-14 | Disposition: A | Discharge status: Home / Self Care | Payer: MEDICAID | Attending: Emergency Medicine | Admitting: Emergency Medicine

## 2014-03-13 ENCOUNTER — Emergency Department (HOSPITAL_COMMUNITY): Payer: MEDICAID

## 2014-03-13 ENCOUNTER — Encounter (HOSPITAL_COMMUNITY): Payer: Self-pay | Admitting: Emergency Medicine

## 2014-03-13 DIAGNOSIS — F411 Generalized anxiety disorder: Secondary | ICD-10-CM | POA: Insufficient documentation

## 2014-03-13 DIAGNOSIS — I10 Essential (primary) hypertension: Secondary | ICD-10-CM | POA: Diagnosis not present

## 2014-03-13 DIAGNOSIS — F319 Bipolar disorder, unspecified: Secondary | ICD-10-CM | POA: Insufficient documentation

## 2014-03-13 DIAGNOSIS — F1010 Alcohol abuse, uncomplicated: Secondary | ICD-10-CM | POA: Diagnosis not present

## 2014-03-13 DIAGNOSIS — R0789 Other chest pain: Secondary | ICD-10-CM

## 2014-03-13 DIAGNOSIS — IMO0002 Reserved for concepts with insufficient information to code with codable children: Secondary | ICD-10-CM | POA: Diagnosis not present

## 2014-03-13 DIAGNOSIS — Z7982 Long term (current) use of aspirin: Secondary | ICD-10-CM | POA: Insufficient documentation

## 2014-03-13 DIAGNOSIS — Z79899 Other long term (current) drug therapy: Secondary | ICD-10-CM | POA: Insufficient documentation

## 2014-03-13 DIAGNOSIS — Z880 Allergy status to penicillin: Secondary | ICD-10-CM | POA: Diagnosis not present

## 2014-03-13 DIAGNOSIS — J441 Chronic obstructive pulmonary disease with (acute) exacerbation: Secondary | ICD-10-CM | POA: Insufficient documentation

## 2014-03-13 DIAGNOSIS — F17200 Nicotine dependence, unspecified, uncomplicated: Secondary | ICD-10-CM | POA: Diagnosis not present

## 2014-03-13 DIAGNOSIS — R110 Nausea: Secondary | ICD-10-CM | POA: Diagnosis not present

## 2014-03-13 DIAGNOSIS — J449 Chronic obstructive pulmonary disease, unspecified: Secondary | ICD-10-CM

## 2014-03-13 LAB — ETHANOL: Alcohol, Ethyl (B): 106 mg/dL — ABNORMAL HIGH (ref 0–11)

## 2014-03-13 LAB — COMPREHENSIVE METABOLIC PANEL
ALT: 20 U/L (ref 0–35)
AST: 21 U/L (ref 0–37)
Albumin: 4 g/dL (ref 3.5–5.2)
Alkaline Phosphatase: 95 U/L (ref 39–117)
Anion gap: 20 — ABNORMAL HIGH (ref 5–15)
BILIRUBIN TOTAL: 0.2 mg/dL — AB (ref 0.3–1.2)
BUN: 6 mg/dL (ref 6–23)
CHLORIDE: 99 meq/L (ref 96–112)
CO2: 19 meq/L (ref 19–32)
CREATININE: 0.59 mg/dL (ref 0.50–1.10)
Calcium: 9.7 mg/dL (ref 8.4–10.5)
GFR calc Af Amer: >90 mL/min (ref >90)
Glucose, Bld: 119 mg/dL — ABNORMAL HIGH (ref 70–99)
Potassium: 3.3 mEq/L — ABNORMAL LOW (ref 3.7–5.3)
Sodium: 138 mEq/L (ref 137–147)
Total Protein: 7.2 g/dL (ref 6.0–8.3)

## 2014-03-13 LAB — CBC WITH DIFFERENTIAL/PLATELET
Basophils Absolute: 0 10*3/uL (ref 0.0–0.1)
Basophils Relative: 0 % (ref 0–1)
Eosinophils Absolute: 0.2 10*3/uL (ref 0.0–0.7)
Eosinophils Relative: 3 % (ref 0–5)
HEMATOCRIT: 38.9 % (ref 36.0–46.0)
HEMOGLOBIN: 14 g/dL (ref 12.0–15.0)
LYMPHS PCT: 31 % (ref 12–46)
Lymphs Abs: 2.4 10*3/uL (ref 0.7–4.0)
MCH: 33.8 pg (ref 26.0–34.0)
MCHC: 36 g/dL (ref 30.0–36.0)
MCV: 94 fL (ref 78.0–100.0)
MONO ABS: 0.9 10*3/uL (ref 0.1–1.0)
Monocytes Relative: 11 % (ref 3–12)
Neutro Abs: 4.2 10*3/uL (ref 1.7–7.7)
Neutrophils Relative %: 55 % (ref 43–77)
Platelets: 253 10*3/uL (ref 150–400)
RBC: 4.14 MIL/uL (ref 3.87–5.11)
RDW: 13.8 % (ref 11.5–15.5)
WBC: 7.8 10*3/uL (ref 4.0–10.5)

## 2014-03-13 LAB — TROPONIN I: Troponin I: <0.3 ng/mL (ref <0.30)

## 2014-03-13 MED ORDER — ASPIRIN 81 MG PO CHEW
324.0000 mg | CHEWABLE_TABLET | Freq: Once | ORAL | Status: AC
  Administered 2014-03-13: 324 mg via ORAL
  Filled 2014-03-13: qty 4

## 2014-03-13 MED ORDER — ACETAMINOPHEN 325 MG PO TABS
650.0000 mg | ORAL_TABLET | Freq: Once | ORAL | Status: AC
  Administered 2014-03-13: 650 mg via ORAL
  Filled 2014-03-13: qty 2

## 2014-03-13 MED ORDER — NITROGLYCERIN 0.4 MG SL SUBL
0.4000 mg | SUBLINGUAL_TABLET | SUBLINGUAL | Status: DC | PRN
  Administered 2014-03-13 (×2): 0.4 mg via SUBLINGUAL
  Filled 2014-03-13 (×2): qty 1

## 2014-03-13 NOTE — ED Provider Notes (Signed)
CSN: [id number]     Arrival date & time 03/13/14  2111 History  This chart was scribed for Sharyon Cable, MD by Delphia Grates, ED Scribe. This patient was seen in room APA05/APA05 and the patient's care was started at 10:13 PM.      Chief Complaint  Patient presents with  . Chest Pain  . Alcohol Intoxication     Patient is a 58 y.o. female presenting with chest pain and intoxication. The history is provided by the patient. No language interpreter was used.  Chest Pain Pain location:  L chest Pain quality: pressure   Pain radiates to:  Does not radiate Pain radiates to the back: no   Pain severity:  Moderate Timing:  Constant Progression:  Unchanged Chronicity:  New Relieved by:  None tried Worsened by:  Nothing tried Ineffective treatments:  None tried Associated symptoms: cough, dizziness, nausea and shortness of breath   Associated symptoms: no abdominal pain, no fever, no near-syncope and not vomiting   Risk factors: hypertension   Alcohol Intoxication Associated symptoms include chest pain and shortness of breath. Pertinent negatives include no abdominal pain.    HPI Comments: [name] is a 58 y.o. Female, with history of COPD, HTN, bipolar 1 disorder, anxiety, and depression,brought in by ambulance, who presents to the Emergency Department complaining of left sided CP since yesterday.  There is associated SOB, nausea, coughing, dizziness. Patient describes the pain as pressure, and states she was seen here 7 days ago for the same. She denies fever, vomiting, LOC, abdominal pain, or recent injury or trauma to the chest. Patient states she consumed a 24oz beer approximately 4 hours ago, but denies taking any pills.   Past Medical History  Diagnosis Date  . COPD (chronic obstructive pulmonary disease)   . Hypertension   . Bipolar 1 disorder   . Anxiety   . Depression    Past Surgical History  Procedure Laterality Date  . Kidney stone surgery    .  Cholecystectomy    . Hernia repair     Family History  Problem Relation Age of Onset  . COPD Mother   . Stroke     History  Substance Use Topics  . Smoking status: Current Some Day Smoker    Types: Cigarettes  . Smokeless tobacco: Former Systems developer     Comment: currently using patches in attempt to quick  . Alcohol Use: Yes   OB History   Grav Para Term Preterm Abortions TAB SAB Ect Mult Living                 Review of Systems  Constitutional: Negative for fever.  Respiratory: Positive for cough and shortness of breath.   Cardiovascular: Positive for chest pain. Negative for near-syncope.  Gastrointestinal: Positive for nausea. Negative for vomiting and abdominal pain.  Neurological: Positive for dizziness.  All other systems reviewed and are negative.     Allergies  Penicillins and Septra  Home Medications   Prior to Admission medications   Medication Sig Start Date End Date Taking? Authorizing Provider  albuterol (PROVENTIL HFA;VENTOLIN HFA) 108 (90 BASE) MCG/ACT inhaler Inhale 2 puffs into the lungs every 6 (six) hours as needed for wheezing or shortness of breath.   Yes Historical Provider, MD  albuterol (PROVENTIL) (2.5 MG/3ML) 0.083% nebulizer solution Take 3 mLs (2.5 mg total) by nebulization every 6 (six) hours as needed for wheezing or shortness of breath. 07/12/13  Yes Thurnell Lose, MD  albuterol-ipratropium (  COMBIVENT) 18-103 MCG/ACT inhaler Inhale 2 puffs into the lungs every 6 (six) hours as needed for shortness of breath.    Yes Historical Provider, MD  ALPRAZolam Duanne Moron) 1 MG tablet Take 1 mg by mouth 4 (four) times daily as needed for anxiety.   Yes Historical Provider, MD  amLODipine (NORVASC) 10 MG tablet Take 1 tablet (10 mg total) by mouth daily. 07/12/13  Yes Thurnell Lose, MD  aspirin 81 MG chewable tablet Chew 1 tablet (81 mg total) by mouth daily. 03/08/14  Yes Nita Sells, MD  estradiol (ESTRACE) 1 MG tablet Take 1 tablet (1 mg total) by mouth  daily. 09/11/13  Yes Florian Buff, MD  Fluticasone Furoate-Vilanterol (BREO ELLIPTA) 100-25 MCG/INH AEPB Inhale 1 puff into the lungs daily.   Yes Historical Provider, MD  HYDROcodone-acetaminophen (NORCO) 10-325 MG per tablet Take 1 tablet by mouth every 6 (six) hours as needed for moderate pain or severe pain.   Yes Historical Provider, MD  medroxyPROGESTERone (PROVERA) 2.5 MG tablet Take 1 tablet (2.5 mg total) by mouth daily. 09/11/13  Yes Florian Buff, MD  pantoprazole (PROTONIX) 40 MG tablet Take 1 tablet (40 mg total) by mouth 2 (two) times daily. 03/08/14  Yes Nita Sells, MD  sucralfate (CARAFATE) 1 G tablet Take 1 tablet (1 g total) by mouth 4 (four) times daily. 12/31/13  Yes Tanna Furry, MD  theophylline (THEODUR) 100 MG 12 hr tablet Take 100 mg by mouth daily.    Yes Historical Provider, MD   Triage Vitals: BP 123/57  Pulse 101  Temp(Src) 98.9 F (37.2 C) (Oral)  Resp 21  Ht 5\' 4"  (1.626 m)  Wt 210 lb (95.255 kg)  BMI 36.03 kg/m2  SpO2 98%  Physical Exam  Nursing note and vitals reviewed. CONSTITUTIONAL: Disheveled, smells of alcohol HEAD: Normocephalic/atraumatic EYES: EOMI/PERRL ENMT: Mucous membranes moist NECK: supple no meningeal signs SPINE:entire spine nontender CV: S1/S2 noted, no murmurs/rubs/gallops noted LUNGS: Scattered wheezing bilaterally ABDOMEN: soft, nontender, no rebound or guarding GU:no cva tenderness NEURO: Pt is awake/alert, moves all extremitiesx4. Patient is slurring words during exam as she is intoxicated EXTREMITIES: pulses normal, full ROM SKIN: warm, color normal PSYCH: Anxious, constantly hitting her chest throughout exam  ED Course  Procedures   DIAGNOSTIC STUDIES: Oxygen Saturation is 98% on room air, normal by my interpretation.    COORDINATION OF CARE: At 2218 Discussed treatment plan with patient which includes aspirin and labs. Patient agrees.  10:59 PM Pt presents for CP She initially told nurse it started yesterday,  she now reports it started tonight She has been consuming ETOH and nurse reported she was taking xanax (pt denies this) She was just recently in hospital with thorough cardiac workup including stress imaging that was negative It was felt she needed risk factor modifications (Smoking) and low suspicion at that time for ACS She is a poor historian.  Initial troponin is negative She reports improvement with ASA/NTG 11:24 PM Plan is to recheck troponin/ekg.  If there is any dynamic change will need admitted Patient signed out to dr Venora Maples  Labs Review Labs Reviewed  COMPREHENSIVE METABOLIC PANEL - Abnormal; Notable for the following:    Potassium 3.3 (*)    Glucose, Bld 119 (*)    Total Bilirubin 0.2 (*)    Anion gap 20 (*)    All other components within normal limits  ETHANOL - Abnormal; Notable for the following:    Alcohol, Ethyl (B) 106 (*)    All  other components within normal limits  CBC WITH DIFFERENTIAL  TROPONIN I    Imaging Review Dg Chest Portable 1 View  03/13/2014   CLINICAL DATA:  Chest pain with shortness of breath.  EXAM: PORTABLE CHEST - 1 VIEW  COMPARISON:  03/06/2014.  FINDINGS: 2140 hrs. No focal airspace consolidation. No overt airspace pulmonary edema. Interstitial markings are diffusely coarsened with chronic features. Cardiopericardial silhouette is at upper limits of normal for size. Telemetry leads overlie the chest.  IMPRESSION: Underlying chronic interstitial coarsening without overt edema or focal airspace consolidation.   Electronically Signed   By: Misty Stanley M.D.   On: 03/13/2014 21:52     EKG Interpretation   Date/Time:  Wednesday March 13 2014 21:22:15 EDT Ventricular Rate:  101 PR Interval:  157 QRS Duration: 76 QT Interval:  390 QTC Calculation: 505 R Axis:   78 Text Interpretation:  Sinus tachycardia Probable left atrial enlargement  Anteroseptal infarct, age indeterminate artifact noted No significant  change since last tracing  Confirmed by Christy Gentles  MD, [place] ([id number]) on  03/13/2014 9:27:37 PM      MDM   Final diagnoses:  Other chest pain  Chronic obstructive pulmonary disease, unspecified COPD, unspecified chronic bronchitis type  Alcohol abuse    Nursing notes including past medical history and social history reviewed and considered in documentation xrays reviewed and considered Labs/vital reviewed and considered Previous records reviewed and considered   I personally performed the services described in this documentation, which was scribed in my presence. The recorded information has been reviewed and is accurate.      Sharyon Cable, MD 03/13/14 2325

## 2014-03-13 NOTE — ED Notes (Signed)
Pt arrived by EMS reports chest pain since yesterday. Pt states she was just discharged on Friday. Pt admits to drinking a 24oz beer tonight & has taking xanax today. Pt present w/ slurred speech & the smell of ETOH.

## 2014-03-14 ENCOUNTER — Telehealth: Payer: Self-pay | Admitting: *Deleted

## 2014-03-14 ENCOUNTER — Ambulatory Visit: Payer: Medicaid Other | Admitting: Cardiology

## 2014-03-14 LAB — TROPONIN I: Troponin I: <0.3 ng/mL (ref <0.30)

## 2014-03-14 MED ORDER — NITROGLYCERIN 0.4 MG SL SUBL: 0.4000 mg | SUBLINGUAL_TABLET | SUBLINGUAL | Status: DC | PRN

## 2014-03-14 MED ORDER — GI COCKTAIL ~~LOC~~
30.0000 mL | Freq: Once | ORAL | Status: AC
  Administered 2014-03-14: 30 mL via ORAL
  Filled 2014-03-14: qty 30

## 2014-03-14 NOTE — Discharge Instructions (Signed)

## 2014-03-14 NOTE — ED Notes (Signed)
Pt was assisted to use bathroom, after returning to room nurse assisting another patient to bathroom, noticed a smoke smell in bathroom. When assisted pt back to bed, her cigarettes dropped to the floor, and she was asked if she smoked in the bathroom and she stated no.

## 2014-03-14 NOTE — Telephone Encounter (Signed)
Per Dr Harl Bowie we did not order NTG,but he said ok to fill, pt made aware

## 2014-03-14 NOTE — ED Provider Notes (Addendum)
This is without significant complaints this time.  She is overall well appearing.  My suspicion for acute coronary syndrome is very low.  She had a normal stress test a week ago.  Outpatient PCP followup.  EKG without significant changes.  Troponin x2 negative.  Dg Chest Portable 1 View  03/13/2014   CLINICAL DATA:  Chest pain with shortness of breath.  EXAM: PORTABLE CHEST - 1 VIEW  COMPARISON:  03/06/2014.  FINDINGS: 2140 hrs. No focal airspace consolidation. No overt airspace pulmonary edema. Interstitial markings are diffusely coarsened with chronic features. Cardiopericardial silhouette is at upper limits of normal for size. Telemetry leads overlie the chest.  IMPRESSION: Underlying chronic interstitial coarsening without overt edema or focal airspace consolidation.   Electronically Signed   By: Misty Stanley M.D.   On: 03/13/2014 21:52   Dg Chest Port 1 View  03/06/2014   CLINICAL DATA:  Chest pain, COPD, former smoker, hypertension  EXAM: PORTABLE CHEST - 1 VIEW  COMPARISON:  Portable exam 1227 hr compared 12/17/2013  FINDINGS: Minimal enlargement of cardiac silhouette with pulmonary vascular congestion.  Mediastinal contours normal.  No definite acute infiltrate, pleural effusion or pneumothorax.  Bones unremarkable.  IMPRESSION: Mild enlargement of cardiac silhouette with pulmonary vascular congestion.  No definite acute infiltrate.   Electronically Signed   By: Lavonia Dana M.D.   On: 03/06/2014 12:38  I personally reviewed the imaging tests through PACS system I reviewed available ER/hospitalization records through the EMR   EKG Interpretation  Date/Time:  Wednesday March 13 2014 21:22:15 EDT Ventricular Rate:  101 PR Interval:  157 QRS Duration: 76 QT Interval:  390 QTC Calculation: 505 R Axis:   78 Text Interpretation:  Sinus tachycardia Probable left atrial enlargement Anteroseptal infarct, age indeterminate artifact noted No significant change since last tracing Confirmed by  Christy Gentles  MD, [place] ([id number]) on 03/13/2014 9:27:37 PM        Hoy Morn, MD 03/14/14 0127  Hoy Morn, MD 03/14/14 [phone]

## 2014-03-14 NOTE — Telephone Encounter (Signed)
Pt states she is suppose to be on nitro but nothing was called in for her at hospital. She uses [facility]

## 2014-03-26 ENCOUNTER — Ambulatory Visit (INDEPENDENT_AMBULATORY_CARE_PROVIDER_SITE_OTHER): Payer: Medicaid Other | Admitting: Gastroenterology

## 2014-03-26 ENCOUNTER — Encounter: Payer: Self-pay | Admitting: Gastroenterology

## 2014-03-26 ENCOUNTER — Telehealth: Payer: Self-pay | Admitting: Internal Medicine

## 2014-03-26 VITALS — BP 132/86 | HR 84 | Temp 97.6°F | Ht 64.0 in | Wt 218.4 lb

## 2014-03-26 DIAGNOSIS — K3189 Other diseases of stomach and duodenum: Secondary | ICD-10-CM

## 2014-03-26 DIAGNOSIS — Z86010 Personal history of colonic polyps: Secondary | ICD-10-CM

## 2014-03-26 DIAGNOSIS — R1013 Epigastric pain: Secondary | ICD-10-CM

## 2014-03-26 MED ORDER — ONDANSETRON HCL 4 MG PO TABS: 4.0000 mg | ORAL_TABLET | Freq: Three times a day (TID) | ORAL | Status: DC | PRN

## 2014-03-26 MED ORDER — DEXLANSOPRAZOLE 60 MG PO CPDR: 60.0000 mg | DELAYED_RELEASE_CAPSULE | Freq: Every day | ORAL | Status: DC

## 2014-03-26 MED ORDER — SUCRALFATE 1 GM/10ML PO SUSP: 1.0000 g | Freq: Four times a day (QID) | ORAL | Status: DC

## 2014-03-26 NOTE — Telephone Encounter (Signed)
Pt was seen by [facility] today and while she was waiting for [facility] she came back to the window asking if AS would call something into [facility] for nausea. [phone]

## 2014-03-26 NOTE — Telephone Encounter (Signed)
I sent Zofran to pharmacy to take every 8 hours prn.

## 2014-03-26 NOTE — Telephone Encounter (Signed)
Pt is aware.  

## 2014-03-26 NOTE — Progress Notes (Signed)
Referring Provider: Redmond School, MD Primary Care Physician:  Glo Herring., MD Primary Gastroenterologist:  Dr. Gala Romney   Chief Complaint  Patient presents with  . Abdominal Pain    HPI:   [name] presents today at the request of Dr. Gerarda Fraction secondary to abdominal pain. Epigastric pain intermittently for years. Aggravating factors include eating. Scared to eat at times, not sure what will aggravate it. Associated nausea. Taking Nexium and Protonix, alternating. Has taken Prilosec in the past. Took Carafate while in the hospital. No weight loss. Endorses weight gain. Solid food dysphagia with meat once in awhile. Takes Ibuprofen on occasion. No aspirin powders. Possible low-volume hematochezia in the past. Frequent bowel movements but formed. Last colonoscopy in 2010 with Dr. Arnoldo Morale, with rectal polyps but not enough tissue for pathological examination. Surveillance recommended 2013. Last EGD in 2010 with gastritis/duodenitis, negative H.pylori.     Past Medical History  Diagnosis Date  . COPD (chronic obstructive pulmonary disease)   . Hypertension   . Bipolar 1 disorder   . Anxiety   . Depression     Past Surgical History  Procedure Laterality Date  . Kidney stone surgery    . Cholecystectomy    . Hernia repair    . Colonoscopy  July 2010    Dr. Arnoldo Morale: 3 rectal polyps, not enough tissue for pathologic examination, recommended surveillance in 3 years  . Esophagogastroduodenoscopy  July 2010    Dr. Arnoldo Morale: gastritis and duodenitis, H.pylori negative    Current Outpatient Prescriptions  Medication Sig Dispense Refill  . albuterol (PROVENTIL HFA;VENTOLIN HFA) 108 (90 BASE) MCG/ACT inhaler Inhale 2 puffs into the lungs every 6 (six) hours as needed for wheezing or shortness of breath.      Marland Kitchen albuterol (PROVENTIL) (2.5 MG/3ML) 0.083% nebulizer solution Take 3 mLs (2.5 mg total) by nebulization every 6 (six) hours as needed for wheezing or shortness of  breath.  75 mL  12  . albuterol-ipratropium (COMBIVENT) 18-103 MCG/ACT inhaler Inhale 2 puffs into the lungs every 6 (six) hours as needed for shortness of breath.       . ALPRAZolam (XANAX) 1 MG tablet Take 1 mg by mouth 4 (four) times daily as needed for anxiety.      Marland Kitchen amLODipine (NORVASC) 10 MG tablet Take 1 tablet (10 mg total) by mouth daily.  30 tablet  2  . aspirin 81 MG chewable tablet Chew 1 tablet (81 mg total) by mouth daily.  30 tablet  0  . esomeprazole (NEXIUM) 40 MG capsule Take 40 mg by mouth daily at 12 noon.      Marland Kitchen estradiol (ESTRACE) 1 MG tablet Take 1 tablet (1 mg total) by mouth daily.  30 tablet  11  . Fluticasone Furoate-Vilanterol (BREO ELLIPTA) 100-25 MCG/INH AEPB Inhale 1 puff into the lungs daily.      Marland Kitchen HYDROcodone-acetaminophen (NORCO) 10-325 MG per tablet Take 1 tablet by mouth every 6 (six) hours as needed for moderate pain or severe pain.      . medroxyPROGESTERone (PROVERA) 2.5 MG tablet Take 1 tablet (2.5 mg total) by mouth daily.  30 tablet  11  . nitroGLYCERIN (NITROSTAT) 0.4 MG SL tablet Place 1 tablet (0.4 mg total) under the tongue every 5 (five) minutes as needed for chest pain.  25 tablet  3  . pantoprazole (PROTONIX) 40 MG tablet Take 1 tablet (40 mg total) by mouth 2 (two) times daily.  60 tablet  0  . theophylline (  THEODUR) 100 MG 12 hr tablet Take 100 mg by mouth daily.       Marland Kitchen dexlansoprazole (DEXILANT) 60 MG capsule Take 1 capsule (60 mg total) by mouth daily.  30 capsule  3  . ondansetron (ZOFRAN) 4 MG tablet Take 1 tablet (4 mg total) by mouth every 8 (eight) hours as needed for nausea or vomiting.  30 tablet  1  . sucralfate (CARAFATE) 1 GM/10ML suspension Take 10 mLs (1 g total) by mouth 4 (four) times daily.  420 mL  1   No current facility-administered medications for this visit.    Allergies as of 03/26/2014 - Review Complete 03/26/2014  Allergen Reaction Noted  . Penicillins Other (See Comments) 02/07/2012  . Septra  [sulfamethoxazole-trimethoprim] Other (See Comments) 02/07/2012    Family History  Problem Relation Age of Onset  . COPD Mother   . Stroke    . Colon cancer      adopted, does not know family history     History   Social History  . Marital Status: Married    Spouse Name: N/A    Number of Children: N/A  . Years of Education: N/A   Occupational History  . disability    Social History Main Topics  . Smoking status: Current Some Day Smoker    Types: Cigarettes  . Smokeless tobacco: Former Systems developer     Comment: smokes a cigarette here and there  . Alcohol Use: No  . Drug Use: No  . Sexual Activity: Yes    Birth Control/ Protection: Post-menopausal   Other Topics Concern  . Not on file   Social History Narrative  . No narrative on file    Review of Systems: Gen: see HPI CV: chest pain, palpitations, seen in hospital early 03/2014, negative cardiac work-up Resp: +DOE GI: see HPI GU : Denies urinary burning, urinary frequency, urinary incontinence.  MS: +left knee pain Derm: Denies rash, itching, dry skin Psych: baseline depression/anxiety. "good days, bad days".  Heme: Denies bruising, bleeding, and enlarged lymph nodes.  Physical Exam: BP 132/86  Pulse 84  Temp(Src) 97.6 F (36.4 C) (Oral)  Ht 5\' 4"  (1.626 m)  Wt 218 lb 6.4 oz (99.066 kg)  BMI 37.47 kg/m2 General:   Alert and oriented. Well-developed, well-nourished, pleasant and cooperative. Head:  Normocephalic and atraumatic. Eyes:  Conjunctiva pink, sclera clear, no icterus.   Conjunctiva pink. Ears:  Normal auditory acuity. Nose:  No deformity, discharge,  or lesions. Mouth:  No deformity or lesions, mucosa pink and moist.  Lungs:  Scattered rhonchi bilaterally Heart:  S1, S2 present without murmurs noted.  Abdomen:  +BS, soft, TTP epigastric and non-distended. Without mass or HSM. No rebound or guarding. No hernias noted. Rectal:  Deferred  Msk:  Symmetrical without gross deformities. Normal  posture. Extremities:  Without edema. Neurologic:  Alert and  oriented x4;  grossly normal neurologically. Skin:  Intact, warm and dry without significant lesions or rashes Psych:  Alert and cooperative. Normal mood and affect.  Lab Results  Component Value Date   WBC 7.8 03/13/2014   HGB 14.0 03/13/2014   HCT 38.9 03/13/2014   MCV 94.0 03/13/2014   PLT 253 03/13/2014   Lab Results  Component Value Date   ALT 20 03/13/2014   AST 21 03/13/2014   ALKPHOS 95 03/13/2014   BILITOT 0.2* 03/13/2014   Lab Results  Component Value Date   CREATININE 0.59 03/13/2014   BUN 6 03/13/2014   NA 138 03/13/2014  K 3.3* 03/13/2014   CL 99 03/13/2014   CO2 19 03/13/2014

## 2014-03-26 NOTE — Patient Instructions (Signed)
I need to get the last procedure notes before we set you up for another procedure. You will definitely need an upper endoscopy; if you need another colonoscopy, we will do that at the same time as well.  Stop both Protonix and Nexium. I have provided samples of Dexilant to take once each morning. I also sent the prescription to the pharmacy.  I have refilled the Carafate prescription, which you will take 4 times a day.

## 2014-03-29 ENCOUNTER — Encounter: Payer: Medicaid Other | Admitting: Adult Health

## 2014-03-29 ENCOUNTER — Encounter: Payer: Self-pay | Admitting: Adult Health

## 2014-03-29 ENCOUNTER — Encounter: Payer: Self-pay | Admitting: *Deleted

## 2014-03-29 NOTE — Progress Notes (Signed)
    ERROR. No show

## 2014-04-01 ENCOUNTER — Telehealth: Payer: Self-pay | Admitting: Gastroenterology

## 2014-04-01 ENCOUNTER — Encounter: Payer: Self-pay | Admitting: Gastroenterology

## 2014-04-01 DIAGNOSIS — Z860100 Personal history of colon polyps, unspecified: Secondary | ICD-10-CM | POA: Insufficient documentation

## 2014-04-01 NOTE — Assessment & Plan Note (Signed)
Last colonoscopy in 2010 by Dr. Arnoldo Morale with polyps; unable to submit tissue specimen at that time due to inadequate sample per notes. Needs surveillance now. Believes she may have seen low-volume hematochezia in the past but none currently. Colonoscopy now scheduled for surveillance purposes.  Proceed with TCS with Dr. Gala Romney in near future: the risks, benefits, and alternatives have been discussed with the patient in detail. The patient states understanding and desires to proceed.

## 2014-04-01 NOTE — Assessment & Plan Note (Signed)
58 year old with chronic epigastric pain and associated nausea despite multiple PPI trials; occasional NSAID use but not routinely. Solid food dysphagia intermittently. Last EGD in 2010 with gastritis/duodenitis. Negative H.pylori. Gallbladder absent and labs unrevealing. Due to persistent pain, needs EGD/ED for further assessment. Consider updated CT scan, as last was in 2013.   Stop Protonix and Nexium. Start Dexilant once daily. Proceed with upper endoscopy and dilation in the near future with Dr. Gala Romney. The risks, benefits, and alternatives have been discussed in detail with patient. They have stated understanding and desire to proceed.

## 2014-04-01 NOTE — Telephone Encounter (Signed)
[name]: patient needs to be scheduled for colonoscopy, upper endoscopy and dilation with Dr. Gala Romney. Reason: surveillance due to personal history of colon polyps, dyspepsia, and dysphagia.

## 2014-04-02 ENCOUNTER — Other Ambulatory Visit: Payer: Self-pay

## 2014-04-02 ENCOUNTER — Encounter (HOSPITAL_COMMUNITY): Payer: Self-pay | Admitting: Pharmacy Technician

## 2014-04-02 DIAGNOSIS — R1319 Other dysphagia: Secondary | ICD-10-CM

## 2014-04-02 DIAGNOSIS — R1013 Epigastric pain: Secondary | ICD-10-CM

## 2014-04-02 DIAGNOSIS — Z86010 Personal history of colonic polyps: Secondary | ICD-10-CM

## 2014-04-02 MED ORDER — PEG-KCL-NACL-NASULF-NA ASC-C 100 G PO SOLR: 1.0000 | ORAL | Status: DC

## 2014-04-02 NOTE — Telephone Encounter (Signed)
Pt is schedule for her TCS/EGD/ED on 04/17/14 at 1:00. Instruction are in the mail and patient is aware.

## 2014-04-03 NOTE — Progress Notes (Signed)
cc'ed to pcp °

## 2014-04-04 ENCOUNTER — Ambulatory Visit: Payer: Medicaid Other | Admitting: Internal Medicine

## 2014-04-16 ENCOUNTER — Encounter: Payer: Self-pay | Admitting: Orthopedic Surgery

## 2014-04-16 ENCOUNTER — Telehealth: Payer: Self-pay | Admitting: Internal Medicine

## 2014-04-16 ENCOUNTER — Ambulatory Visit: Payer: Medicaid Other | Admitting: Orthopedic Surgery

## 2014-04-16 NOTE — Telephone Encounter (Signed)
Noted  

## 2014-04-16 NOTE — Telephone Encounter (Signed)
PATIENT HUSBAND CALLED TO CANCEL PROCEDURE   [phone]

## 2014-04-17 ENCOUNTER — Ambulatory Visit (HOSPITAL_COMMUNITY): Admission: RE | Admit: 2014-04-17 | Payer: Medicaid Other | Source: Ambulatory Visit | Admitting: Internal Medicine

## 2014-04-17 ENCOUNTER — Encounter (HOSPITAL_COMMUNITY): Admission: RE | Payer: Self-pay | Source: Ambulatory Visit

## 2014-04-17 SURGERY — COLONOSCOPY
Anesthesia: Moderate Sedation

## 2014-04-22 ENCOUNTER — Encounter: Payer: Self-pay | Admitting: Internal Medicine

## 2014-04-22 ENCOUNTER — Other Ambulatory Visit: Payer: Self-pay | Admitting: Gastroenterology

## 2014-04-27 ENCOUNTER — Emergency Department (HOSPITAL_COMMUNITY): Payer: Medicaid Other

## 2014-04-27 ENCOUNTER — Emergency Department (HOSPITAL_COMMUNITY)
Admission: EM | Admit: 2014-04-27 | Discharge: 2014-04-28 | Disposition: A | Discharge status: Home / Self Care | Payer: Medicaid Other | Attending: Emergency Medicine | Admitting: Emergency Medicine

## 2014-04-27 ENCOUNTER — Encounter (HOSPITAL_COMMUNITY): Payer: Self-pay | Admitting: Emergency Medicine

## 2014-04-27 DIAGNOSIS — J441 Chronic obstructive pulmonary disease with (acute) exacerbation: Secondary | ICD-10-CM | POA: Diagnosis not present

## 2014-04-27 DIAGNOSIS — F319 Bipolar disorder, unspecified: Secondary | ICD-10-CM | POA: Insufficient documentation

## 2014-04-27 DIAGNOSIS — Z7982 Long term (current) use of aspirin: Secondary | ICD-10-CM | POA: Diagnosis not present

## 2014-04-27 DIAGNOSIS — F419 Anxiety disorder, unspecified: Secondary | ICD-10-CM | POA: Diagnosis not present

## 2014-04-27 DIAGNOSIS — Z880 Allergy status to penicillin: Secondary | ICD-10-CM | POA: Diagnosis not present

## 2014-04-27 DIAGNOSIS — I10 Essential (primary) hypertension: Secondary | ICD-10-CM | POA: Insufficient documentation

## 2014-04-27 DIAGNOSIS — Z79899 Other long term (current) drug therapy: Secondary | ICD-10-CM | POA: Insufficient documentation

## 2014-04-27 DIAGNOSIS — R0602 Shortness of breath: Secondary | ICD-10-CM

## 2014-04-27 DIAGNOSIS — Z720 Tobacco use: Secondary | ICD-10-CM | POA: Insufficient documentation

## 2014-04-27 DIAGNOSIS — Z7951 Long term (current) use of inhaled steroids: Secondary | ICD-10-CM | POA: Insufficient documentation

## 2014-04-27 DIAGNOSIS — Z79810 Long term (current) use of selective estrogen receptor modulators (SERMs): Secondary | ICD-10-CM | POA: Diagnosis not present

## 2014-04-27 DIAGNOSIS — R05 Cough: Secondary | ICD-10-CM | POA: Diagnosis present

## 2014-04-27 MED ORDER — IPRATROPIUM BROMIDE 0.02 % IN SOLN
0.5000 mg | Freq: Once | RESPIRATORY_TRACT | Status: AC
Start: 2014-04-27 — End: 2014-04-27
  Administered 2014-04-27: 0.5 mg via RESPIRATORY_TRACT
  Filled 2014-04-27: qty 2.5

## 2014-04-27 MED ORDER — HYDROCOD POLST-CHLORPHEN POLST 10-8 MG/5ML PO LQCR
5.0000 mL | Freq: Once | ORAL | Status: AC
  Administered 2014-04-28: 5 mL via ORAL
  Filled 2014-04-27: qty 5

## 2014-04-27 MED ORDER — METHYLPREDNISOLONE SODIUM SUCC 125 MG IJ SOLR
125.0000 mg | Freq: Once | INTRAMUSCULAR | Status: AC
  Administered 2014-04-28: 125 mg via INTRAVENOUS
  Filled 2014-04-27: qty 2

## 2014-04-27 MED ORDER — ALBUTEROL (5 MG/ML) CONTINUOUS INHALATION SOLN
10.0000 mg/h | INHALATION_SOLUTION | Freq: Once | RESPIRATORY_TRACT | Status: AC
  Administered 2014-04-27: 10 mg/h via RESPIRATORY_TRACT
  Filled 2014-04-27: qty 20

## 2014-04-27 NOTE — ED Provider Notes (Signed)
CSN: [id number]     Arrival date & time 04/27/14  2244 History   First MD Initiated Contact with Patient 04/27/14 2314     Chief Complaint  Patient presents with  . Cough     (Consider location/radiation/quality/duration/timing/severity/associated sxs/prior Treatment) Patient is a 58 y.o. female presenting with cough. The history is provided by the patient and the EMS personnel.  Cough Cough characteristics:  Productive Sputum characteristics:  Green Severity:  Moderate Onset quality:  Gradual Duration:  1 week Timing:  Intermittent Progression:  Worsening Chronicity:  Recurrent Smoker: yes   Context: sick contacts, upper respiratory infection and weather changes   Relieved by:  Nothing Ineffective treatments:  Beta-agonist inhaler Associated symptoms: shortness of breath, sinus congestion and wheezing   Associated symptoms: no chest pain, no eye discharge and no fever   Risk factors: no chemical exposure and no recent travel     Past Medical History  Diagnosis Date  . COPD (chronic obstructive pulmonary disease)   . Hypertension   . Bipolar 1 disorder   . Anxiety   . Depression    Past Surgical History  Procedure Laterality Date  . Kidney stone surgery    . Cholecystectomy    . Hernia repair    . Colonoscopy  July 2010    Dr. Arnoldo Morale: 3 rectal polyps, not enough tissue for pathologic examination, recommended surveillance in 3 years  . Esophagogastroduodenoscopy  July 2010    Dr. Arnoldo Morale: gastritis and duodenitis, H.pylori negative   Family History  Problem Relation Age of Onset  . COPD Mother   . Stroke    . Colon cancer      adopted, does not know family history    History  Substance Use Topics  . Smoking status: Current Some Day Smoker    Types: Cigarettes  . Smokeless tobacco: Former Systems developer     Comment: smokes a cigarette here and there  . Alcohol Use: No   OB History   Grav Para Term Preterm Abortions TAB SAB Ect Mult Living                  Review of Systems  Constitutional: Negative for fever and activity change.       All ROS Neg except as noted in HPI  Eyes: Negative for photophobia and discharge.  Respiratory: Positive for cough, shortness of breath and wheezing.   Cardiovascular: Negative for chest pain and palpitations.  Gastrointestinal: Negative for abdominal pain and blood in stool.  Genitourinary: Negative for dysuria, frequency and hematuria.  Musculoskeletal: Positive for arthralgias. Negative for back pain and neck pain.  Skin: Negative.   Neurological: Negative for dizziness, seizures and speech difficulty.  Psychiatric/Behavioral: Negative for hallucinations and confusion. The patient is nervous/anxious.       Allergies  Penicillins and Septra  Home Medications   Prior to Admission medications   Medication Sig Start Date End Date Taking? Authorizing Provider  albuterol (PROVENTIL HFA;VENTOLIN HFA) 108 (90 BASE) MCG/ACT inhaler Inhale 2 puffs into the lungs every 6 (six) hours as needed for wheezing or shortness of breath.    Historical Provider, MD  albuterol (PROVENTIL) (2.5 MG/3ML) 0.083% nebulizer solution Take 3 mLs (2.5 mg total) by nebulization every 6 (six) hours as needed for wheezing or shortness of breath. 07/12/13   Thurnell Lose, MD  albuterol-ipratropium (COMBIVENT) 18-103 MCG/ACT inhaler Inhale 2 puffs into the lungs every 6 (six) hours as needed for shortness of breath.     Historical Provider,  MD  ALPRAZolam (XANAX) 1 MG tablet Take 1 mg by mouth 4 (four) times daily as needed for anxiety.    Historical Provider, MD  amLODipine (NORVASC) 10 MG tablet Take 1 tablet (10 mg total) by mouth daily. 07/12/13   Thurnell Lose, MD  aspirin 81 MG chewable tablet Chew 1 tablet (81 mg total) by mouth daily. 03/08/14   Nita Sells, MD  CARAFATE 1 GM/10ML suspension TAKE 10MLs BY MOUTH FOUR TIMES DAILY. 04/22/14   Mahala Menghini, PA-C  dexlansoprazole (DEXILANT) 60 MG capsule Take 1 capsule (60  mg total) by mouth daily. 03/26/14   Orvil Feil, NP  esomeprazole (NEXIUM) 40 MG capsule Take 40 mg by mouth daily at 12 noon.    Historical Provider, MD  estradiol (ESTRACE) 1 MG tablet Take 1 tablet (1 mg total) by mouth daily. 09/11/13   Florian Buff, MD  Fluticasone Furoate-Vilanterol (BREO ELLIPTA) 100-25 MCG/INH AEPB Inhale 1 puff into the lungs daily.    Historical Provider, MD  HYDROcodone-acetaminophen (NORCO) 10-325 MG per tablet Take 1 tablet by mouth every 6 (six) hours as needed for moderate pain or severe pain.    Historical Provider, MD  medroxyPROGESTERone (PROVERA) 2.5 MG tablet Take 1 tablet (2.5 mg total) by mouth daily. 09/11/13   Florian Buff, MD  nitroGLYCERIN (NITROSTAT) 0.4 MG SL tablet Place 1 tablet (0.4 mg total) under the tongue every 5 (five) minutes as needed for chest pain. 03/14/14   Arnoldo Lenis, MD  ondansetron (ZOFRAN) 4 MG tablet Take 1 tablet (4 mg total) by mouth every 8 (eight) hours as needed for nausea or vomiting. 03/26/14   Orvil Feil, NP  pantoprazole (PROTONIX) 40 MG tablet Take 1 tablet (40 mg total) by mouth 2 (two) times daily. 03/08/14   Nita Sells, MD  theophylline (THEODUR) 100 MG 12 hr tablet Take 100 mg by mouth daily.     Historical Provider, MD   BP 134/50  Pulse 89  Temp(Src) 98.5 F (36.9 C) (Oral)  Resp 26  Ht 5\' 4"  (1.626 m)  Wt 220 lb (99.791 kg)  BMI 37.74 kg/m2  SpO2 98% Physical Exam  Nursing note and vitals reviewed. Constitutional: She is oriented to person, place, and time. She appears well-developed and well-nourished.  Non-toxic appearance.  HENT:  Head: Normocephalic.  Right Ear: Tympanic membrane and external ear normal.  Left Ear: Tympanic membrane and external ear normal.  Eyes: EOM and lids are normal. Pupils are equal, round, and reactive to light.  Neck: Normal range of motion. Neck supple. Carotid bruit is not present.  Cardiovascular: Normal rate, regular rhythm, normal heart sounds, intact distal  pulses and normal pulses.   Pulmonary/Chest: No accessory muscle usage. Tachypnea noted. No respiratory distress. She has no wheezes. She has rhonchi.  Abdominal: Soft. Bowel sounds are normal. She exhibits no shifting dullness, no distension, no ascites and no pulsatile midline mass. There is no hepatosplenomegaly. There is no tenderness. There is no guarding and no CVA tenderness.  Musculoskeletal: Normal range of motion.  Lymphadenopathy:       Head (right side): No submandibular adenopathy present.       Head (left side): No submandibular adenopathy present.    She has no cervical adenopathy.  Neurological: She is alert and oriented to person, place, and time. She has normal strength. No cranial nerve deficit or sensory deficit.  Skin: Skin is warm and dry.  Psychiatric: She has a normal mood and affect.  Her speech is normal.    ED Course  Pt seen with me by Dr Christy Gentles.  Procedures (including critical care time) Labs Review Labs Reviewed  CBC WITH DIFFERENTIAL  BASIC METABOLIC PANEL  PRO B NATRIURETIC PEPTIDE  I-STAT [facility], ED    Imaging Review No results found.   EKG Interpretation None      MDM  Pt received albuterol by EMS and continued to receive atrovent and albuterol in ED. After continuous neb and tussionex, cough significantly improved.  Troponin and EKG negative for acute event.  WBC 10.8, cbc otherwise wnl. K+ 3.3, Anion gap 16. Bmet otherwise wnl. Bnatriuretic Pep - 243   Chest xray - Non-acute at this time  Pt's cough significantly improved. Pt feels she is back to her baseline, and can manage her condition with neb machines at home. Plan - pt to continue the albuterol at home. Will add promethazine DM for cough, and prednisone taper. Pt given strict instructions for return if any changes in SOB, hemoptosis, fever, extremity swelling,  weakness, or deterioration in conditions.   Final diagnoses:  COPD exacerbation    **I have reviewed nursing  notes, vital signs, and all appropriate lab and imaging results for this patient.Lenox Ahr, PA-C 05/04/14 [phone]

## 2014-04-27 NOTE — ED Notes (Signed)
Per EMS, patient c/o cough and shortness of breath.  Patient was admitted in September for COPD and CHF, per EMS, patient failed a stress test and did not follow up with cardiologist.  Patient took nebulizer at home and was given neb treatment by EMS.  Patient actively coughing with green sputum.

## 2014-04-28 ENCOUNTER — Emergency Department (HOSPITAL_COMMUNITY): Payer: Medicaid Other

## 2014-04-28 LAB — CBC WITH DIFFERENTIAL/PLATELET
BASOS ABS: 0 10*3/uL (ref 0.0–0.1)
Basophils Relative: 0 % (ref 0–1)
Eosinophils Absolute: 0.3 10*3/uL (ref 0.0–0.7)
Eosinophils Relative: 3 % (ref 0–5)
HCT: 41 % (ref 36.0–46.0)
Hemoglobin: 14.3 g/dL (ref 12.0–15.0)
LYMPHS PCT: 48 % — AB (ref 12–46)
Lymphs Abs: 5.2 10*3/uL — ABNORMAL HIGH (ref 0.7–4.0)
MCH: 33.8 pg (ref 26.0–34.0)
MCHC: 34.9 g/dL (ref 30.0–36.0)
MCV: 96.9 fL (ref 78.0–100.0)
Monocytes Absolute: 1.1 10*3/uL — ABNORMAL HIGH (ref 0.1–1.0)
Monocytes Relative: 10 % (ref 3–12)
NEUTROS ABS: 4.2 10*3/uL (ref 1.7–7.7)
Neutrophils Relative %: 39 % — ABNORMAL LOW (ref 43–77)
PLATELETS: 288 10*3/uL (ref 150–400)
RBC: 4.23 MIL/uL (ref 3.87–5.11)
RDW: 14 % (ref 11.5–15.5)
WBC: 10.8 10*3/uL — AB (ref 4.0–10.5)

## 2014-04-28 LAB — BASIC METABOLIC PANEL
ANION GAP: 16 — AB (ref 5–15)
BUN: 8 mg/dL (ref 6–23)
CHLORIDE: 99 meq/L (ref 96–112)
CO2: 22 meq/L (ref 19–32)
Calcium: 9.9 mg/dL (ref 8.4–10.5)
Creatinine, Ser: 0.62 mg/dL (ref 0.50–1.10)
GFR calc Af Amer: >90 mL/min (ref >90)
GFR calc non Af Amer: >90 mL/min (ref >90)
Glucose, Bld: 112 mg/dL — ABNORMAL HIGH (ref 70–99)
Potassium: 3.3 mEq/L — ABNORMAL LOW (ref 3.7–5.3)
SODIUM: 137 meq/L (ref 137–147)

## 2014-04-28 LAB — I-STAT TROPONIN, ED: Troponin i, poc: 0 ng/mL (ref 0.00–0.08)

## 2014-04-28 LAB — PRO B NATRIURETIC PEPTIDE: PRO B NATRI PEPTIDE: 243.2 pg/mL — AB (ref 0–125)

## 2014-04-28 MED ORDER — PREDNISONE 10 MG PO TABS: ORAL_TABLET | ORAL | Status: DC

## 2014-04-28 MED ORDER — PROMETHAZINE-DM 6.25-15 MG/5ML PO SYRP: ORAL_SOLUTION | ORAL | Status: DC

## 2014-04-28 NOTE — Discharge Instructions (Signed)
Chronic Obstructive Pulmonary Disease Exacerbation   Your xray is negative for pneumonia. Please use your nebulizer and the prescription for  Prednisone. Please see Dr Gerarda Fraction for follow up.                                                                                                      Chronic obstructive pulmonary disease (COPD) is a common lung problem. In COPD, the flow of air from the lungs is limited. COPD exacerbations are times that breathing gets worse and you need extra treatment. Without treatment they can be life threatening. If they happen often, your lungs can become more damaged. HOME CARE  Do not smoke.  Avoid tobacco smoke and other things that bother your lungs.  If given, take your antibiotic medicine as told. Finish the medicine even if you start to feel better.  Only take medicines as told by your doctor.  Drink enough fluids to keep your pee (urine) clear or pale yellow (unless your doctor has told you not to).  Use a cool mist machine (vaporizer).  If you use oxygen or a machine that turns liquid medicine into a mist (nebulizer), continue to use them as told.  Keep up with shots (vaccinations) as told by your doctor.  Exercise regularly.  Eat healthy foods.  Keep all doctor visits as told. GET HELP RIGHT AWAY IF:  You are very short of breath and it gets worse.  You have trouble talking.  You have bad chest pain.  You have blood in your spit (sputum).  You have a fever.  You keep throwing up (vomiting).  You feel weak, or you pass out (faint).  You feel confused.  You keep getting worse. MAKE SURE YOU:   Understand these instructions.  Will watch your condition.  Will get help right away if you are not doing well or get worse. Document Released: 06/10/2011 Document Revised: 04/11/2013 Document Reviewed: 02/23/2013 [facility] Patient Information 2015 [place], [place]. This information is not intended to replace advice given to you by your  health care provider. Make sure you discuss any questions you have with your health care provider.

## 2014-04-28 NOTE — ED Notes (Signed)
States she is better and is ready to go home now. Coughing remains diminished. Pt states she has her own nebulizer at home with enough medication.

## 2014-04-30 ENCOUNTER — Other Ambulatory Visit: Payer: Self-pay | Admitting: Gastroenterology

## 2014-05-27 ENCOUNTER — Ambulatory Visit: Payer: Medicaid Other | Admitting: Gastroenterology

## 2014-06-06 ENCOUNTER — Ambulatory Visit: Payer: Medicaid Other | Admitting: Obstetrics & Gynecology

## 2014-06-10 ENCOUNTER — Ambulatory Visit: Payer: Medicaid Other | Admitting: Obstetrics & Gynecology

## 2014-06-17 ENCOUNTER — Other Ambulatory Visit: Payer: Self-pay | Admitting: Gastroenterology

## 2014-06-19 ENCOUNTER — Telehealth: Payer: Self-pay | Admitting: Internal Medicine

## 2014-06-19 NOTE — Telephone Encounter (Signed)
Pt said that her pharmacy was going to fax [place] a refill request for her carafate and Vesper told her that they haven't heard from [place]. She is wanting to get this called in before the cut off time for delivery. Please advise [phone] or [phone]

## 2014-06-19 NOTE — Telephone Encounter (Signed)
I called [facility], they have the Rx and they will deliver it today.

## 2014-07-02 ENCOUNTER — Other Ambulatory Visit: Payer: Self-pay

## 2014-07-03 ENCOUNTER — Telehealth: Payer: Self-pay | Admitting: Internal Medicine

## 2014-07-03 ENCOUNTER — Telehealth: Payer: Self-pay | Admitting: *Deleted

## 2014-07-03 MED ORDER — SUCRALFATE 1 GM/10ML PO SUSP: ORAL | Status: DC

## 2014-07-03 NOTE — Telephone Encounter (Signed)
Patient continues to call multiple times asking about her carafate prescription. I told her that the nurse is aware and it is in the refill box and once the provider is able to break away from seeing patients it will be taken care of. Patient said, "you told me that yesterday" and "I will call back in a little while to check again".   Patient has OV scheduled with [place] on 1/5 to see LSL.

## 2014-07-03 NOTE — Telephone Encounter (Signed)
Routing to refill box. Pt wants refill for carafate. She has finished the last rx she was given.

## 2014-07-03 NOTE — Telephone Encounter (Signed)
PATIENT CALLED AGAIN INQUIRING ABOUT NEEDING HER PRESCRIPTION REFILLED.

## 2014-07-03 NOTE — Telephone Encounter (Signed)
Routing to refill box  

## 2014-07-05 ENCOUNTER — Emergency Department (HOSPITAL_COMMUNITY): Payer: Medicaid Other

## 2014-07-05 ENCOUNTER — Inpatient Hospital Stay (HOSPITAL_COMMUNITY)
Admission: EM | Admit: 2014-07-05 | Discharge: 2014-07-07 | DRG: 190 | Disposition: A | Discharge status: Home / Self Care | Payer: Medicaid Other | Attending: Internal Medicine | Admitting: Internal Medicine

## 2014-07-05 ENCOUNTER — Encounter (HOSPITAL_COMMUNITY): Payer: Self-pay

## 2014-07-05 DIAGNOSIS — Z7982 Long term (current) use of aspirin: Secondary | ICD-10-CM | POA: Diagnosis not present

## 2014-07-05 DIAGNOSIS — K219 Gastro-esophageal reflux disease without esophagitis: Secondary | ICD-10-CM | POA: Diagnosis present

## 2014-07-05 DIAGNOSIS — E669 Obesity, unspecified: Secondary | ICD-10-CM | POA: Diagnosis present

## 2014-07-05 DIAGNOSIS — Z7952 Long term (current) use of systemic steroids: Secondary | ICD-10-CM

## 2014-07-05 DIAGNOSIS — R0602 Shortness of breath: Secondary | ICD-10-CM

## 2014-07-05 DIAGNOSIS — E860 Dehydration: Secondary | ICD-10-CM | POA: Diagnosis present

## 2014-07-05 DIAGNOSIS — F17210 Nicotine dependence, cigarettes, uncomplicated: Secondary | ICD-10-CM | POA: Diagnosis present

## 2014-07-05 DIAGNOSIS — F1410 Cocaine abuse, uncomplicated: Secondary | ICD-10-CM | POA: Diagnosis present

## 2014-07-05 DIAGNOSIS — G9341 Metabolic encephalopathy: Secondary | ICD-10-CM | POA: Diagnosis present

## 2014-07-05 DIAGNOSIS — F419 Anxiety disorder, unspecified: Secondary | ICD-10-CM | POA: Diagnosis present

## 2014-07-05 DIAGNOSIS — T40605A Adverse effect of unspecified narcotics, initial encounter: Secondary | ICD-10-CM | POA: Diagnosis present

## 2014-07-05 DIAGNOSIS — J9600 Acute respiratory failure, unspecified whether with hypoxia or hypercapnia: Secondary | ICD-10-CM | POA: Diagnosis present

## 2014-07-05 DIAGNOSIS — Z6834 Body mass index (BMI) 34.0-34.9, adult: Secondary | ICD-10-CM | POA: Diagnosis not present

## 2014-07-05 DIAGNOSIS — J441 Chronic obstructive pulmonary disease with (acute) exacerbation: Principal | ICD-10-CM | POA: Diagnosis present

## 2014-07-05 DIAGNOSIS — E871 Hypo-osmolality and hyponatremia: Secondary | ICD-10-CM | POA: Diagnosis present

## 2014-07-05 DIAGNOSIS — Z23 Encounter for immunization: Secondary | ICD-10-CM

## 2014-07-05 DIAGNOSIS — Z7951 Long term (current) use of inhaled steroids: Secondary | ICD-10-CM

## 2014-07-05 DIAGNOSIS — Z720 Tobacco use: Secondary | ICD-10-CM | POA: Diagnosis present

## 2014-07-05 DIAGNOSIS — J9601 Acute respiratory failure with hypoxia: Secondary | ICD-10-CM | POA: Diagnosis present

## 2014-07-05 DIAGNOSIS — F319 Bipolar disorder, unspecified: Secondary | ICD-10-CM | POA: Diagnosis present

## 2014-07-05 DIAGNOSIS — G92 Toxic encephalopathy: Secondary | ICD-10-CM | POA: Diagnosis present

## 2014-07-05 DIAGNOSIS — I10 Essential (primary) hypertension: Secondary | ICD-10-CM | POA: Diagnosis present

## 2014-07-05 DIAGNOSIS — T424X5A Adverse effect of benzodiazepines, initial encounter: Secondary | ICD-10-CM | POA: Diagnosis present

## 2014-07-05 DIAGNOSIS — Z5900 Homelessness unspecified: Secondary | ICD-10-CM

## 2014-07-05 DIAGNOSIS — R4182 Altered mental status, unspecified: Secondary | ICD-10-CM

## 2014-07-05 DIAGNOSIS — F1910 Other psychoactive substance abuse, uncomplicated: Secondary | ICD-10-CM

## 2014-07-05 LAB — ACETAMINOPHEN LEVEL: Acetaminophen (Tylenol), Serum: <10 ug/mL — ABNORMAL LOW (ref 10–30)

## 2014-07-05 LAB — URINALYSIS, ROUTINE W REFLEX MICROSCOPIC
BILIRUBIN URINE: NEGATIVE
Glucose, UA: NEGATIVE mg/dL
Hgb urine dipstick: NEGATIVE
Ketones, ur: NEGATIVE mg/dL
Leukocytes, UA: NEGATIVE
Nitrite: NEGATIVE
PH: 5 (ref 5.0–8.0)
Protein, ur: NEGATIVE mg/dL
SPECIFIC GRAVITY, URINE: 1.005 (ref 1.005–1.030)
Urobilinogen, UA: 0.2 mg/dL (ref 0.0–1.0)

## 2014-07-05 LAB — ETHANOL: ALCOHOL ETHYL (B): 6 mg/dL (ref 0–9)

## 2014-07-05 LAB — BASIC METABOLIC PANEL
Anion gap: 9 (ref 5–15)
BUN: 11 mg/dL (ref 6–23)
CO2: 24 mmol/L (ref 19–32)
CREATININE: 0.56 mg/dL (ref 0.50–1.10)
Calcium: 8.9 mg/dL (ref 8.4–10.5)
Chloride: 99 mEq/L (ref 96–112)
GFR calc Af Amer: >90 mL/min (ref >90)
GFR calc non Af Amer: >90 mL/min (ref >90)
GLUCOSE: 166 mg/dL — AB (ref 70–99)
Potassium: 3.7 mmol/L (ref 3.5–5.1)
SODIUM: 132 mmol/L — AB (ref 135–145)

## 2014-07-05 LAB — COMPREHENSIVE METABOLIC PANEL
ALBUMIN: 4.4 g/dL (ref 3.5–5.2)
ALT: 37 U/L — AB (ref 0–35)
AST: 28 U/L (ref 0–37)
Alkaline Phosphatase: 99 U/L (ref 39–117)
Anion gap: 11 (ref 5–15)
BUN: 10 mg/dL (ref 6–23)
CALCIUM: 9.5 mg/dL (ref 8.4–10.5)
CO2: 24 mmol/L (ref 19–32)
Chloride: 94 mEq/L — ABNORMAL LOW (ref 96–112)
Creatinine, Ser: 0.52 mg/dL (ref 0.50–1.10)
GFR calc Af Amer: >90 mL/min (ref >90)
Glucose, Bld: 116 mg/dL — ABNORMAL HIGH (ref 70–99)
Potassium: 3.6 mmol/L (ref 3.5–5.1)
SODIUM: 129 mmol/L — AB (ref 135–145)
TOTAL PROTEIN: 7.4 g/dL (ref 6.0–8.3)
Total Bilirubin: 0.6 mg/dL (ref 0.3–1.2)

## 2014-07-05 LAB — CBC WITH DIFFERENTIAL/PLATELET
BASOS PCT: 0 % (ref 0–1)
Basophils Absolute: 0 10*3/uL (ref 0.0–0.1)
Eosinophils Absolute: 0.4 10*3/uL (ref 0.0–0.7)
Eosinophils Relative: 3 % (ref 0–5)
HCT: 44 % (ref 36.0–46.0)
Hemoglobin: 15.9 g/dL — ABNORMAL HIGH (ref 12.0–15.0)
LYMPHS PCT: 21 % (ref 12–46)
Lymphs Abs: 2.3 10*3/uL (ref 0.7–4.0)
MCH: 34.8 pg — ABNORMAL HIGH (ref 26.0–34.0)
MCHC: 36.1 g/dL — ABNORMAL HIGH (ref 30.0–36.0)
MCV: 96.3 fL (ref 78.0–100.0)
Monocytes Absolute: 1 10*3/uL (ref 0.1–1.0)
Monocytes Relative: 9 % (ref 3–12)
NEUTROS ABS: 7.3 10*3/uL (ref 1.7–7.7)
Neutrophils Relative %: 67 % (ref 43–77)
Platelets: 278 10*3/uL (ref 150–400)
RBC: 4.57 MIL/uL (ref 3.87–5.11)
RDW: 13.6 % (ref 11.5–15.5)
WBC: 11 10*3/uL — AB (ref 4.0–10.5)

## 2014-07-05 LAB — BLOOD GAS, ARTERIAL
ACID-BASE DEFICIT: 0.7 mmol/L (ref 0.0–2.0)
Bicarbonate: 23.8 mEq/L (ref 20.0–24.0)
FIO2: 0.21 %
O2 SAT: 89.3 %
PCO2 ART: 41.2 mmHg (ref 35.0–45.0)
PH ART: 7.379 (ref 7.350–7.450)
PO2 ART: 59.8 mmHg — AB (ref 80.0–100.0)
Patient temperature: 37
TCO2: 20.7 mmol/L (ref 0–100)

## 2014-07-05 LAB — TROPONIN I: Troponin I: <0.03 ng/mL (ref <0.031)

## 2014-07-05 LAB — RAPID URINE DRUG SCREEN, HOSP PERFORMED
Amphetamines: NOT DETECTED
Barbiturates: NOT DETECTED
Benzodiazepines: POSITIVE — AB
COCAINE: POSITIVE — AB
Opiates: NOT DETECTED
Tetrahydrocannabinol: NOT DETECTED

## 2014-07-05 LAB — SALICYLATE LEVEL

## 2014-07-05 LAB — BRAIN NATRIURETIC PEPTIDE: B NATRIURETIC PEPTIDE 5: 13 pg/mL (ref 0.0–100.0)

## 2014-07-05 MED ORDER — INFLUENZA VAC SPLIT QUAD 0.5 ML IM SUSY
0.5000 mL | PREFILLED_SYRINGE | INTRAMUSCULAR | Status: DC
  Filled 2014-07-05: qty 0.5

## 2014-07-05 MED ORDER — SODIUM CHLORIDE 0.9 % IV SOLN
INTRAVENOUS | Status: DC
  Administered 2014-07-05: 15:00:00 via INTRAVENOUS

## 2014-07-05 MED ORDER — ACETAMINOPHEN 325 MG PO TABS
650.0000 mg | ORAL_TABLET | Freq: Four times a day (QID) | ORAL | Status: DC | PRN
  Administered 2014-07-05 – 2014-07-06 (×2): 650 mg via ORAL
  Filled 2014-07-05 (×2): qty 2

## 2014-07-05 MED ORDER — THEOPHYLLINE ER 200 MG PO TB12
100.0000 mg | ORAL_TABLET | Freq: Every day | ORAL | Status: DC
  Administered 2014-07-06 – 2014-07-07 (×2): 100 mg via ORAL
  Filled 2014-07-05 (×2): qty 1

## 2014-07-05 MED ORDER — PNEUMOCOCCAL VAC POLYVALENT 25 MCG/0.5ML IJ INJ
0.5000 mL | INJECTION | INTRAMUSCULAR | Status: AC
  Administered 2014-07-06: 0.5 mL via INTRAMUSCULAR
  Filled 2014-07-05: qty 0.5

## 2014-07-05 MED ORDER — ALBUTEROL (5 MG/ML) CONTINUOUS INHALATION SOLN
10.0000 mg/h | INHALATION_SOLUTION | Freq: Once | RESPIRATORY_TRACT | Status: AC
  Administered 2014-07-05: 10 mg/h via RESPIRATORY_TRACT
  Filled 2014-07-05: qty 20

## 2014-07-05 MED ORDER — SUCRALFATE 1 GM/10ML PO SUSP
1.0000 g | Freq: Three times a day (TID) | ORAL | Status: DC
  Administered 2014-07-05 – 2014-07-07 (×8): 1 g via ORAL
  Filled 2014-07-05 (×8): qty 10

## 2014-07-05 MED ORDER — ENOXAPARIN SODIUM 40 MG/0.4ML ~~LOC~~ SOLN
40.0000 mg | SUBCUTANEOUS | Status: DC
  Administered 2014-07-05 – 2014-07-06 (×2): 40 mg via SUBCUTANEOUS
  Filled 2014-07-05 (×2): qty 0.4

## 2014-07-05 MED ORDER — AMLODIPINE BESYLATE 5 MG PO TABS
10.0000 mg | ORAL_TABLET | Freq: Every day | ORAL | Status: DC
  Administered 2014-07-05 – 2014-07-07 (×3): 10 mg via ORAL
  Filled 2014-07-05 (×3): qty 2

## 2014-07-05 MED ORDER — IPRATROPIUM-ALBUTEROL 0.5-2.5 (3) MG/3ML IN SOLN
3.0000 mL | Freq: Once | RESPIRATORY_TRACT | Status: AC
  Administered 2014-07-05: 3 mL via RESPIRATORY_TRACT
  Filled 2014-07-05: qty 3

## 2014-07-05 MED ORDER — ALBUTEROL SULFATE (2.5 MG/3ML) 0.083% IN NEBU
2.5000 mg | INHALATION_SOLUTION | Freq: Four times a day (QID) | RESPIRATORY_TRACT | Status: DC | PRN
  Administered 2014-07-05 – 2014-07-06 (×2): 2.5 mg via RESPIRATORY_TRACT
  Filled 2014-07-05 (×2): qty 3

## 2014-07-05 MED ORDER — PREDNISONE 50 MG PO TABS
60.0000 mg | ORAL_TABLET | Freq: Once | ORAL | Status: AC
  Administered 2014-07-05: 60 mg via ORAL
  Filled 2014-07-05 (×2): qty 1

## 2014-07-05 MED ORDER — NICOTINE 21 MG/24HR TD PT24
21.0000 mg | MEDICATED_PATCH | Freq: Every day | TRANSDERMAL | Status: DC
  Administered 2014-07-05 – 2014-07-07 (×3): 21 mg via TRANSDERMAL
  Filled 2014-07-05 (×3): qty 1

## 2014-07-05 MED ORDER — SODIUM CHLORIDE 0.9 % IV BOLUS (SEPSIS)
1000.0000 mL | Freq: Once | INTRAVENOUS | Status: AC
  Administered 2014-07-05: 1000 mL via INTRAVENOUS

## 2014-07-05 MED ORDER — ONDANSETRON HCL 4 MG PO TABS: 4.0000 mg | ORAL_TABLET | Freq: Four times a day (QID) | ORAL | Status: DC | PRN

## 2014-07-05 MED ORDER — ASPIRIN 81 MG PO CHEW
81.0000 mg | CHEWABLE_TABLET | Freq: Every day | ORAL | Status: DC
  Administered 2014-07-05 – 2014-07-07 (×3): 81 mg via ORAL
  Filled 2014-07-05 (×3): qty 1

## 2014-07-05 MED ORDER — PANTOPRAZOLE SODIUM 40 MG PO TBEC
40.0000 mg | DELAYED_RELEASE_TABLET | Freq: Two times a day (BID) | ORAL | Status: DC
  Administered 2014-07-05 – 2014-07-07 (×4): 40 mg via ORAL
  Filled 2014-07-05 (×4): qty 1

## 2014-07-05 MED ORDER — ONDANSETRON HCL 4 MG/2ML IJ SOLN: 4.0000 mg | Freq: Four times a day (QID) | INTRAMUSCULAR | Status: DC | PRN

## 2014-07-05 MED ORDER — ALPRAZOLAM 0.5 MG PO TABS
0.5000 mg | ORAL_TABLET | Freq: Three times a day (TID) | ORAL | Status: DC | PRN
  Administered 2014-07-05 – 2014-07-07 (×6): 0.5 mg via ORAL
  Filled 2014-07-05 (×6): qty 1

## 2014-07-05 MED ORDER — IPRATROPIUM BROMIDE 0.02 % IN SOLN
0.5000 mg | Freq: Once | RESPIRATORY_TRACT | Status: AC
  Administered 2014-07-05: 0.5 mg via RESPIRATORY_TRACT
  Filled 2014-07-05: qty 2.5

## 2014-07-05 MED ORDER — SODIUM CHLORIDE 0.9 % IV SOLN
Freq: Once | INTRAVENOUS | Status: AC
  Administered 2014-07-05: 09:00:00 via INTRAVENOUS

## 2014-07-05 MED ORDER — ACETAMINOPHEN 650 MG RE SUPP: 650.0000 mg | Freq: Four times a day (QID) | RECTAL | Status: DC | PRN

## 2014-07-05 NOTE — ED Notes (Signed)
Pt unable to ambulate without 2 assist

## 2014-07-05 NOTE — Progress Notes (Signed)
   07/05/14 1832  Mobility  Activity Ambulate in hall  Level of Assistance Independent  Assistive Device None  Distance Ambulated (ft) 500 ft  Ambulation Response Tolerated well   O2 saturations: Patient's O2 saturation is 96% at rest on room air. Patient's O2 saturation is 94% with exertion on room air.

## 2014-07-05 NOTE — ED Provider Notes (Signed)
CSN: [id number]     Arrival date & time 07/05/14  0720 History  This chart was scribed for Rebekah Essex, MD by Stephania Fragmin, ED Scribe. This patient was seen in room APA16A/APA16A and the patient's care was started at 7:33 AM.      Chief Complaint  Patient presents with  . Shortness of Breath   The history is provided by the patient. No language interpreter was used.   LEVEL 5 CAVEAT DUE TO APPARENT INTOXICATION  HPI Comments: Rebekah CUMPIAN is a 59 y.o. female with a history of COPD who presents to the Emergency Department complaining of worsening SOB that began 2-3 weeks ago, but which was worse today. She endorses productive cough with some white sputum, slight subjective fever, and chest pain when she coughs. Patient states she drank 1 beer about 12 hours ago. She states she has a history of pre-diabetes, for which she denies needing insulin, and hypertension, for which she takes medication. She denies a history of MI or stents. Patient had a stress test done 2-3 months ago, which was negative, per medical records.   Past Medical History  Diagnosis Date  . COPD (chronic obstructive pulmonary disease)   . Hypertension   . Bipolar 1 disorder   . Anxiety   . Depression    Past Surgical History  Procedure Laterality Date  . Kidney stone surgery    . Cholecystectomy    . Hernia repair    . Colonoscopy  July 2010    Dr. Arnoldo Morale: 3 rectal polyps, not enough tissue for pathologic examination, recommended surveillance in 3 years  . Esophagogastroduodenoscopy  July 2010    Dr. Arnoldo Morale: gastritis and duodenitis, H.pylori negative   Family History  Problem Relation Age of Onset  . COPD Mother   . Stroke    . Colon cancer      adopted, does not know family history    History  Substance Use Topics  . Smoking status: Current Some Day Smoker    Types: Cigarettes  . Smokeless tobacco: Former Systems developer     Comment: smokes a cigarette here and there  . Alcohol Use: No   OB History     No data available     Review of Systems  Unable to perform ROS   Allergies  Penicillins and Septra  Home Medications   Prior to Admission medications   Medication Sig Start Date End Date Taking? Authorizing Provider  albuterol (PROVENTIL HFA;VENTOLIN HFA) 108 (90 BASE) MCG/ACT inhaler Inhale 2 puffs into the lungs every 6 (six) hours as needed for wheezing or shortness of breath.    Historical Provider, MD  albuterol (PROVENTIL) (2.5 MG/3ML) 0.083% nebulizer solution Take 3 mLs (2.5 mg total) by nebulization every 6 (six) hours as needed for wheezing or shortness of breath. 07/12/13   Thurnell Lose, MD  albuterol-ipratropium (COMBIVENT) 18-103 MCG/ACT inhaler Inhale 2 puffs into the lungs every 6 (six) hours as needed for shortness of breath.     Historical Provider, MD  ALPRAZolam Duanne Moron) 1 MG tablet Take 1 mg by mouth 4 (four) times daily as needed for anxiety.    Historical Provider, MD  amLODipine (NORVASC) 10 MG tablet Take 1 tablet (10 mg total) by mouth daily. 07/12/13   Thurnell Lose, MD  aspirin 81 MG chewable tablet Chew 1 tablet (81 mg total) by mouth daily. 03/08/14   Nita Sells, MD  dexlansoprazole (DEXILANT) 60 MG capsule Take 1 capsule (60 mg total) by mouth  daily. 03/26/14   Orvil Feil, NP  estradiol (ESTRACE) 1 MG tablet Take 1 tablet (1 mg total) by mouth daily. 09/11/13   Florian Buff, MD  Fluticasone Furoate-Vilanterol (BREO ELLIPTA) 100-25 MCG/INH AEPB Inhale 1 puff into the lungs daily.    Historical Provider, MD  HYDROcodone-acetaminophen (NORCO) 10-325 MG per tablet Take 1 tablet by mouth every 6 (six) hours as needed for moderate pain or severe pain.    Historical Provider, MD  medroxyPROGESTERone (PROVERA) 2.5 MG tablet Take 1 tablet (2.5 mg total) by mouth daily. 09/11/13   Florian Buff, MD  nitroGLYCERIN (NITROSTAT) 0.4 MG SL tablet Place 1 tablet (0.4 mg total) under the tongue every 5 (five) minutes as needed for chest pain. 03/14/14   Arnoldo Lenis, MD  ondansetron (ZOFRAN) 4 MG tablet Take 1 tablet (4 mg total) by mouth every 8 (eight) hours as needed for nausea or vomiting. 03/26/14   Orvil Feil, NP  pantoprazole (PROTONIX) 40 MG tablet Take 1 tablet (40 mg total) by mouth 2 (two) times daily. 03/08/14   Nita Sells, MD  predniSONE (DELTASONE) 10 MG tablet 6,5,4,3,2,1 - take with food 04/28/14   Lenox Ahr, PA-C  promethazine-dextromethorphan (PROMETHAZINE-DM) 6.25-15 MG/5ML syrup 5 ml q6h prn cough 04/28/14   Lenox Ahr, PA-C  sucralfate (CARAFATE) 1 GM/10ML suspension Take 77mLs by mouth four times daily 07/03/14   Ranae Pila, NP  theophylline (THEODUR) 100 MG 12 hr tablet Take 100 mg by mouth daily.     Historical Provider, MD   BP 133/75 mmHg  Pulse 102  Temp(Src) 98 F (36.7 C) (Oral)  Resp 20  Ht 5\' 4"  (1.626 m)  Wt 200 lb (90.719 kg)  BMI 34.31 kg/m2  SpO2 94% Physical Exam  Constitutional: She is oriented to person, place, and time. She appears well-developed and well-nourished. No distress.  HENT:  Head: Normocephalic and atraumatic.  Mouth/Throat: Oropharynx is clear and moist. No oropharyngeal exudate.  Eyes: Conjunctivae and EOM are normal. Pupils are equal, round, and reactive to light.  Neck: Normal range of motion. Neck supple.  No meningismus.  Cardiovascular: Normal rate, regular rhythm, normal heart sounds and intact distal pulses.   No murmur heard. No peripheral edema.  Pulmonary/Chest: Effort normal. No respiratory distress. She has wheezes. She exhibits tenderness.  Moderate air exchange with expiratory wheezing.  Abdominal: Soft. There is no tenderness. There is no rebound and no guarding.  Musculoskeletal: Normal range of motion. She exhibits no edema or tenderness.  No peripheral edema.  Neurological: She is alert and oriented to person, place, and time. No cranial nerve deficit. She exhibits normal muscle tone. Coordination normal.  No ataxia on finger to nose  bilaterally. No pronator drift. 5/5 strength throughout. CN 2-12 intact. Negative Romberg. Equal grip strength. Sensation intact. Gait is normal.   Skin: Skin is warm.  Psychiatric: She has a normal mood and affect. Her behavior is normal.  Nursing note and vitals reviewed.   ED Course  Procedures (including critical care time)  DIAGNOSTIC STUDIES: Oxygen Saturation is 93% on room air, adequate by my interpretation.    COORDINATION OF CARE: 7:33 AM - Discussed treatment plan with pt at bedside and pt agreed to plan.   Labs Review Labs Reviewed  CBC WITH DIFFERENTIAL - Abnormal; Notable for the following:    WBC 11.0 (*)    Hemoglobin 15.9 (*)    MCH 34.8 (*)    MCHC 36.1 (*)  All other components within normal limits  COMPREHENSIVE METABOLIC PANEL - Abnormal; Notable for the following:    Sodium 129 (*)    Chloride 94 (*)    Glucose, Bld 116 (*)    ALT 37 (*)    All other components within normal limits  URINE RAPID DRUG SCREEN (HOSP PERFORMED) - Abnormal; Notable for the following:    Cocaine POSITIVE (*)    Benzodiazepines POSITIVE (*)    All other components within normal limits  ACETAMINOPHEN LEVEL - Abnormal; Notable for the following:    Acetaminophen (Tylenol), Serum <10.0 (*)    All other components within normal limits  BLOOD GAS, ARTERIAL - Abnormal; Notable for the following:    pO2, Arterial 59.8 (*)    Allens test (pass/fail) NOT INDICATED (*)    All other components within normal limits  BASIC METABOLIC PANEL - Abnormal; Notable for the following:    Sodium 132 (*)    Glucose, Bld 166 (*)    All other components within normal limits  TROPONIN I  ETHANOL  BRAIN NATRIURETIC PEPTIDE  URINALYSIS, ROUTINE W REFLEX MICROSCOPIC  SALICYLATE LEVEL  TROPONIN I  CBC    Imaging Review Dg Chest 2 View  07/05/2014   CLINICAL DATA:  Shortness of breath, wheezing, cough since last night  EXAM: CHEST  2 VIEW  COMPARISON:  April 28, 2014  FINDINGS: The heart size  and mediastinal contours are stable. The heart size is mildly enlarged. There is mild peribronchial thickening unchanged compared to prior exam. There is no focal pneumonia or pleural effusion. The visualized skeletal structures are stable.  IMPRESSION: No focal pneumonia. Mild peribronchial thickening unchanged compared to prior exam of April 28, 2014.   Electronically Signed   By: Abelardo Diesel M.D.   On: 07/05/2014 09:21   Ct Head Wo Contrast  07/05/2014   CLINICAL DATA:  Acute altered mental status this morning along with shortness of breath.  EXAM: CT HEAD WITHOUT CONTRAST  TECHNIQUE: Contiguous axial images were obtained from the base of the skull through the vertex without intravenous contrast.  COMPARISON:  09/13/2010  FINDINGS: The ventricles are normal in size and configuration. No extra-axial fluid collections are identified. The gray-white differentiation is normal. No CT findings for acute intracranial process such as hemorrhage or infarction. Stable area of low attenuation near the frontal horn of left lateral ventricle could be a remote white matter infarct or area of microvascular ischemic change. No mass lesions. The brainstem and cerebellum are grossly normal.  The bony structures are intact. The paranasal sinuses and mastoid air cells are clear. The globes are intact.  IMPRESSION: No acute intracranial findings or mass lesion.   Electronically Signed   By: Kalman Jewels M.D.   On: 07/05/2014 09:44     EKG Interpretation   Date/Time:  Friday July 05 2014 07:33:28 EST Ventricular Rate:  89 PR Interval:  171 QRS Duration: 85 QT Interval:  396 QTC Calculation: 482 R Axis:   55 Text Interpretation:  Sinus rhythm Consider left atrial enlargement  Borderline prolonged QT interval Baseline wander in lead(s) II No  significant change was found Confirmed by Wyvonnia Dusky  MD, Temitope Flammer ([id number]) on  07/05/2014 7:49:29 AM      MDM   Final diagnoses:  Altered mental status  COPD  exacerbation   Level V caveat for apparent intoxication. Patient in no distress but describes shortness of breath ongoing for the past several weeks but worse this morning. History of COPD  with chronic cough. Endorses chest pain with coughing.  Negative stress test September 2015. Nebs given for decreased breath sounds.. CXR negative.  ETOH only 9. Patient still encephalopathic.  UDS +cocaine and benzos.  Suspect drug effect contributing to decreased mental status.  CT head negative. No CO2 retention on ABG. Mild hypnatremia 129.. IVF given.  With ongoing encephalopathy and new o2 requirement, will admit.  D/w Dr. [place] Pink.  I personally performed the services described in this documentation, which was scribed in my presence. The recorded information has been reviewed and is accurate.   Rebekah Essex, MD 07/05/14 1816

## 2014-07-05 NOTE — ED Notes (Signed)
Pt reports stayed with a friend last night and was out early this morning walking down the road and became sob and wheezing.  Reports cough, productive at times.

## 2014-07-05 NOTE — Progress Notes (Signed)
Patient claims she lost her phone, but cannot remember where she last had it. She asked me to check with ED to see if it was left downstairs. I called ED, and NT states she gave it to patient after patient was in room 319. It has not been found at this time.   Patient brought in home medications with her. Meds were verified with patient and sent to pharmacy. Explained to patient that they would be returned prior to discharge.   Patient also brought two packs of cigarettes with her, one being full and one with only a few left. Educated patient that we do not allow smoking in hospital and that cigarettes would be placed in her drawer and returned at discharge. Patient verbalized understanding. Cigarettes and liter placed in med room drawer. I also asked MD to place order for nicotine patch to help with urge to smoke. Patch applied to patient. During report to third shift, patient found smoking in room. Security notified and confiscated cigarettes. Patient re-educated on importance of smoking cessation. Information passed onto third shift.

## 2014-07-05 NOTE — ED Notes (Signed)
Placed pt on 2L O2 [place], sats 87% on RA with pt sleeping, improved to 92% with O2.

## 2014-07-05 NOTE — H&P (Signed)
History and Physical  [name]:[id number] DOB: 10-17-1955 DOA: 07/05/2014  Referring physician: Harlow Mares, ER physician PCP: Glo Herring., MD   Chief Complaint: Shortness of breath  HPI: [name] is a 59 y.o. female  With past mental history of bipolar disorder, anxiety and COPD who came into the emergency room short complaining of worsening shortness of breath. Patient was noted to be mildly hypoxic which improved on 2 L. However the patient also looked to be somewhat delirious and somnolent. Urine drug screen was positive for benzodiazepines of which patient is on 1 mg 4 times a day as needed as well as cocaine. Her serum alcohol level was only minimally elevated on the high end of normal as patient states she had 1 beer last night. In the emergency room, labs are unrevealing with a white count minimally elevated at 11 oh shift. Chest x-ray was negative and BNP was normal. Given acute encephalopathy and questionably acute respiratory failure, hospitals were called for further evaluation and admission.   Review of Systems:  Patient seen after arrival to floor. Pt complains of feeling very anxious. She has number of complaints including feeling shortness of breath, chest tightness, cough-nonproductive, jittery. Pt denies any headaches, vision changes, dysphagia, abdominal pain, hematuria, dysuria, constipation, diarrhea, focal extremity numbness weakness or pain.  Review of systems are otherwise negative  Past Medical History  Diagnosis Date  . COPD (chronic obstructive pulmonary disease)   . Hypertension   . Bipolar 1 disorder   . Anxiety   . Depression    Past Surgical History  Procedure Laterality Date  . Kidney stone surgery    . Cholecystectomy    . Hernia repair    . Colonoscopy  July 2010    Dr. Arnoldo Morale: 3 rectal polyps, not enough tissue for pathologic examination, recommended surveillance in 3 years  . Esophagogastroduodenoscopy  July  2010    Dr. Arnoldo Morale: gastritis and duodenitis, H.pylori negative   Social History:  reports that she has been smoking Cigarettes.  She has been smoking about an occasional cigarette per day. She has quit using smokeless tobacco. She initially reported that she did not drink alcohol or use illicit drugs, however when confronted about this, she stated that she went to a party and she thinks that she smoked cigarettes but had drugs in it. She states that she rarely drinks Patient states that now she is homeless as of recently when she split up with her husband. She is able to participate neck to visit daily living without assistance  Allergies  Allergen Reactions  . Penicillins Other (See Comments)    REACTION: Unknown  . Septra [Sulfamethoxazole-Trimethoprim] Other (See Comments)    REACTION: Unknown    Family History  Problem Relation Age of Onset  . COPD Mother   . Stroke    . Colon cancer      adopted, does not know family history     Confirmed by patient  Prior to Admission medications   Medication Sig Start Date End Date Taking? Authorizing Provider  albuterol (PROVENTIL HFA;VENTOLIN HFA) 108 (90 BASE) MCG/ACT inhaler Inhale 2 puffs into the lungs every 6 (six) hours as needed for wheezing or shortness of breath.    Historical Provider, MD  albuterol (PROVENTIL) (2.5 MG/3ML) 0.083% nebulizer solution Take 3 mLs (2.5 mg total) by nebulization every 6 (six) hours as needed for wheezing or shortness of breath. 07/12/13   Thurnell Lose, MD  albuterol-ipratropium (COMBIVENT) 18-103 MCG/ACT inhaler Inhale  2 puffs into the lungs every 6 (six) hours as needed for shortness of breath.     Historical Provider, MD  ALPRAZolam Duanne Moron) 1 MG tablet Take 1 mg by mouth 4 (four) times daily as needed for anxiety.    Historical Provider, MD  amLODipine (NORVASC) 10 MG tablet Take 1 tablet (10 mg total) by mouth daily. 07/12/13   Thurnell Lose, MD  aspirin 81 MG chewable tablet Chew 1 tablet (81 mg  total) by mouth daily. 03/08/14   Nita Sells, MD  dexlansoprazole (DEXILANT) 60 MG capsule Take 1 capsule (60 mg total) by mouth daily. 03/26/14   Orvil Feil, NP  estradiol (ESTRACE) 1 MG tablet Take 1 tablet (1 mg total) by mouth daily. 09/11/13   Florian Buff, MD  Fluticasone Furoate-Vilanterol (BREO ELLIPTA) 100-25 MCG/INH AEPB Inhale 1 puff into the lungs daily.    Historical Provider, MD  HYDROcodone-acetaminophen (NORCO) 10-325 MG per tablet Take 1 tablet by mouth every 6 (six) hours as needed for moderate pain or severe pain.    Historical Provider, MD  medroxyPROGESTERone (PROVERA) 2.5 MG tablet Take 1 tablet (2.5 mg total) by mouth daily. 09/11/13   Florian Buff, MD  nitroGLYCERIN (NITROSTAT) 0.4 MG SL tablet Place 1 tablet (0.4 mg total) under the tongue every 5 (five) minutes as needed for chest pain. 03/14/14   Arnoldo Lenis, MD  ondansetron (ZOFRAN) 4 MG tablet Take 1 tablet (4 mg total) by mouth every 8 (eight) hours as needed for nausea or vomiting. 03/26/14   Orvil Feil, NP  pantoprazole (PROTONIX) 40 MG tablet Take 1 tablet (40 mg total) by mouth 2 (two) times daily. 03/08/14   Nita Sells, MD  predniSONE (DELTASONE) 10 MG tablet 6,5,4,3,2,1 - take with food 04/28/14   Lenox Ahr, PA-C  promethazine-dextromethorphan (PROMETHAZINE-DM) 6.25-15 MG/5ML syrup 5 ml q6h prn cough 04/28/14   Lenox Ahr, PA-C  sucralfate (CARAFATE) 1 GM/10ML suspension Take 58mLs by mouth four times daily 07/03/14   Ranae Pila, NP  theophylline (THEODUR) 100 MG 12 hr tablet Take 100 mg by mouth daily.     Historical Provider, MD    Physical Exam: BP 133/75 mmHg  Pulse 102  Temp(Src) 98 F (36.7 C) (Oral)  Resp 20  Ht 5\' 4"  (1.626 m)  Wt 90.719 kg (200 lb)  BMI 34.31 kg/m2  SpO2 94%  General:  Alert and oriented 3, anxious Eyes: Sclera nonicteric, extra ocular movements are intact ENT: Normocephalic, atraumatic, mucous members are slightly dry Neck: No  JVD Cardiovascular: Borderline tachycardia, regular rate and rhythm Respiratory: Decreased breath sounds throughout, no wheezes or crackles Abdomen: Soft, obese, nontender, hypoactive bowel sounds Skin: No skin breaks, tears or lesions Musculoskeletal: No clubbing or cyanosis, trace edema Psychiatric: Patient is anxious, but otherwise appropriate, no evidence of psychoses Neurologic: No overt deficits           Labs on Admission:  Basic Metabolic Panel:  Recent Labs Lab 07/05/14 0751 07/05/14 1216  NA 129* 132*  K 3.6 3.7  CL 94* 99  CO2 24 24  GLUCOSE 116* 166*  BUN 10 11  CREATININE 0.52 0.56  CALCIUM 9.5 8.9   Liver Function Tests:  Recent Labs Lab 07/05/14 0751  AST 28  ALT 37*  ALKPHOS 99  BILITOT 0.6  PROT 7.4  ALBUMIN 4.4   No results for input(s): LIPASE, AMYLASE in the last 168 hours. No results for input(s): AMMONIA in the last 168  hours. CBC:  Recent Labs Lab 07/05/14 0751  WBC 11.0*  NEUTROABS 7.3  HGB 15.9*  HCT 44.0  MCV 96.3  PLT 278   Cardiac Enzymes:  Recent Labs Lab 07/05/14 0751 07/05/14 1216  TROPONINI <0.03 <0.03    BNP (last 3 results)  Recent Labs  03/06/14 1211 04/28/14 0026  PROBNP 102.5 243.2*   CBG: No results for input(s): GLUCAP in the last 168 hours.  Radiological Exams on Admission: Dg Chest 2 View  07/05/2014   CLINICAL DATA:  Shortness of breath, wheezing, cough since last night  EXAM: CHEST  2 VIEW  COMPARISON:  April 28, 2014  FINDINGS: The heart size and mediastinal contours are stable. The heart size is mildly enlarged. There is mild peribronchial thickening unchanged compared to prior exam. There is no focal pneumonia or pleural effusion. The visualized skeletal structures are stable.  IMPRESSION: No focal pneumonia. Mild peribronchial thickening unchanged compared to prior exam of April 28, 2014.   Electronically Signed   By: Abelardo Diesel M.D.   On: 07/05/2014 09:21   Ct Head Wo  Contrast  07/05/2014   CLINICAL DATA:  Acute altered mental status this morning along with shortness of breath.  EXAM: CT HEAD WITHOUT CONTRAST  TECHNIQUE: Contiguous axial images were obtained from the base of the skull through the vertex without intravenous contrast.  COMPARISON:  09/13/2010  FINDINGS: The ventricles are normal in size and configuration. No extra-axial fluid collections are identified. The gray-white differentiation is normal. No CT findings for acute intracranial process such as hemorrhage or infarction. Stable area of low attenuation near the frontal horn of left lateral ventricle could be a remote white matter infarct or area of microvascular ischemic change. No mass lesions. The brainstem and cerebellum are grossly normal.  The bony structures are intact. The paranasal sinuses and mastoid air cells are clear. The globes are intact.  IMPRESSION: No acute intracranial findings or mass lesion.   Electronically Signed   By: Kalman Jewels M.D.   On: 07/05/2014 09:44    EKG: Independently reviewed. Sinus rhythm, borderline left atrial enlargement, borderline QT interval prolonged  Assessment/Plan Present on Admission:   ARF (acute respiratory failure)/possible COPD exacerbation: No evidence of acute infection. Chest x-ray unrevealing. BNP within normal limits. Patient could have chronic COPD and possibly sleep apnea and have acute respiratory distress from decreased respiratory drive from substance abuse. No fever or leukocytosis at this time. We'll plan to bring input on oxygen, nebulizers and hold on antibiotics or steroids. Check ambulatory pulse ox. The patient improved, possible discharge tomorrow  . GERD without esophagitis: Continue Carafate and PPI.  Marland Kitchen Hypertension: Monitoring blood pressure  . Bipolar 1 disorder: Patient not on any medications for this. We'll need to discuss with her  . Anxiety: Suspect she takes a large amount of this every day and monitor for withdrawals.  This is the most likely cause of her initial presentation. Put her on half dose 3 times a day when necessary to prevent withdrawals  . Polysubstance abuse: Counseled.  . Obesity (BMI 30-39.9): Patient is criteria with BMI greater than 30  . Encephalopathy, metabolic: Improved from admission. Likely from benzos and possibly opiates Hyponatremia: Mild, from dehydration.  Consultants: None  Code Status: Full code  Family Communication: No family   Disposition Plan: Home tomorrow if breathing stabilized in no acute illness to treat  Time spent: 35 minutes  [facility] Pager [phone]

## 2014-07-06 DIAGNOSIS — Z720 Tobacco use: Secondary | ICD-10-CM | POA: Diagnosis present

## 2014-07-06 DIAGNOSIS — J441 Chronic obstructive pulmonary disease with (acute) exacerbation: Principal | ICD-10-CM

## 2014-07-06 LAB — CBC
HEMATOCRIT: 40.4 % (ref 36.0–46.0)
Hemoglobin: 13.7 g/dL (ref 12.0–15.0)
MCH: 33.7 pg (ref 26.0–34.0)
MCHC: 33.9 g/dL (ref 30.0–36.0)
MCV: 99.5 fL (ref 78.0–100.0)
PLATELETS: 239 10*3/uL (ref 150–400)
RBC: 4.06 MIL/uL (ref 3.87–5.11)
RDW: 13.8 % (ref 11.5–15.5)
WBC: 13.9 10*3/uL — ABNORMAL HIGH (ref 4.0–10.5)

## 2014-07-06 LAB — TSH: TSH: 0.432 u[IU]/mL (ref 0.350–4.500)

## 2014-07-06 MED ORDER — PREDNISONE 20 MG PO TABS
50.0000 mg | ORAL_TABLET | Freq: Every day | ORAL | Status: DC
  Administered 2014-07-06 – 2014-07-07 (×2): 50 mg via ORAL
  Filled 2014-07-06 (×2): qty 1
  Filled 2014-07-06 (×2): qty 2

## 2014-07-06 MED ORDER — TIOTROPIUM BROMIDE MONOHYDRATE 18 MCG IN CAPS
18.0000 ug | ORAL_CAPSULE | Freq: Every day | RESPIRATORY_TRACT | Status: DC
  Administered 2014-07-06 – 2014-07-07 (×2): 18 ug via RESPIRATORY_TRACT
  Filled 2014-07-06: qty 5

## 2014-07-06 MED ORDER — ALBUTEROL SULFATE (2.5 MG/3ML) 0.083% IN NEBU
2.5000 mg | INHALATION_SOLUTION | Freq: Four times a day (QID) | RESPIRATORY_TRACT | Status: DC
  Administered 2014-07-06 – 2014-07-07 (×5): 2.5 mg via RESPIRATORY_TRACT
  Filled 2014-07-06 (×5): qty 3

## 2014-07-06 MED ORDER — METOPROLOL TARTRATE 25 MG PO TABS
12.5000 mg | ORAL_TABLET | Freq: Two times a day (BID) | ORAL | Status: DC
  Administered 2014-07-06 – 2014-07-07 (×3): 12.5 mg via ORAL
  Filled 2014-07-06 (×3): qty 1

## 2014-07-06 MED ORDER — LEVOFLOXACIN 750 MG PO TABS
750.0000 mg | ORAL_TABLET | Freq: Every day | ORAL | Status: DC
  Administered 2014-07-06 – 2014-07-07 (×2): 750 mg via ORAL
  Filled 2014-07-06 (×2): qty 1

## 2014-07-06 NOTE — Plan of Care (Signed)
Problem: Phase I Progression Outcomes Goal: Progress activity as tolerated unless otherwise ordered Outcome: Progressing Ambulated in hallway tolerated well.

## 2014-07-06 NOTE — Progress Notes (Signed)
Patient's oxygen saturation before ambulating in hallway 96%. Patient ambulated in hallway from room 319 to room 308 oxygen saturation remain 96% while ambulating.
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
## 2014-07-06 NOTE — Plan of Care (Signed)
Problem: ICU Phase Progression Outcomes Goal: Flu/PneumoVaccines if indicated Outcome: Completed/Met Date Met:  07/06/14 Patient refused FLU vaccine patient states she was given flu vaccine Sept or October.

## 2014-07-06 NOTE — Progress Notes (Signed)
Medicated with Xanax twice. Patient states Prednisone makes her nervous. Ambulated in hall twice.

## 2014-07-07 LAB — CBC
HEMATOCRIT: 37 % (ref 36.0–46.0)
HEMOGLOBIN: 12.7 g/dL (ref 12.0–15.0)
MCH: 34.5 pg — ABNORMAL HIGH (ref 26.0–34.0)
MCHC: 34.3 g/dL (ref 30.0–36.0)
MCV: 100.5 fL — ABNORMAL HIGH (ref 78.0–100.0)
Platelets: 207 10*3/uL (ref 150–400)
RBC: 3.68 MIL/uL — ABNORMAL LOW (ref 3.87–5.11)
RDW: 13.8 % (ref 11.5–15.5)
WBC: 11.8 10*3/uL — ABNORMAL HIGH (ref 4.0–10.5)

## 2014-07-07 LAB — BASIC METABOLIC PANEL
Anion gap: 5 (ref 5–15)
BUN: 14 mg/dL (ref 6–23)
CO2: 27 mmol/L (ref 19–32)
Calcium: 9.8 mg/dL (ref 8.4–10.5)
Chloride: 106 mEq/L (ref 96–112)
Creatinine, Ser: 0.57 mg/dL (ref 0.50–1.10)
GFR calc Af Amer: >90 mL/min (ref >90)
Glucose, Bld: 152 mg/dL — ABNORMAL HIGH (ref 70–99)
POTASSIUM: 3.9 mmol/L (ref 3.5–5.1)
Sodium: 138 mmol/L (ref 135–145)

## 2014-07-07 MED ORDER — PREDNISONE 10 MG PO TABS: ORAL_TABLET | ORAL | Status: DC

## 2014-07-07 MED ORDER — METOPROLOL TARTRATE 12.5 MG HALF TABLET: 12.5000 mg | ORAL_TABLET | Freq: Two times a day (BID) | ORAL | Status: DC

## 2014-07-07 MED ORDER — LEVOFLOXACIN 500 MG PO TABS: 500.0000 mg | ORAL_TABLET | Freq: Every day | ORAL | Status: AC

## 2014-07-07 MED ORDER — ALPRAZOLAM 0.5 MG PO TABS: 0.5000 mg | ORAL_TABLET | Freq: Four times a day (QID) | ORAL | Status: DC | PRN

## 2014-07-07 NOTE — Progress Notes (Signed)
Discharge instruction reviewed with patient. Patient states she is going to stay with her daughter tonight. Phone numbers given to [facility] Crisis line [phone], [facility]. Saline lock removed.

## 2014-07-07 NOTE — Discharge Summary (Signed)
Discharge Summary  [name] ZOX:[id number] DOB: 1956/05/18  PCP: Glo Herring., MD  Admit date: 07/05/2014 Discharge date: 07/07/2014  Time spent: -25 minutes  Recommendations for Outpatient Follow-up:  1. New medication: Metoprolol 12.5 by mouth twice a day 2. Medication change: Patient advised to decrease her Xanax to 0.5 mg 4 times a day as needed 3. Medication change: Patient on both Dexilant and Protonix. Advised to stop dexilant 4. New medication: Levaquin 500 mg by mouth daily 3 days 5. Medication change: Patient on both when necessary Combivent and when necessary albuterol inhalers. Advised to stop albuterol inhaler 6. New medication: Brief steroid taper  Discharge Diagnoses:  Active Hospital Problems   Diagnosis Date Noted  . COPD exacerbation 07/05/2014  . Tobacco abuse 07/06/2014  . ARF (acute respiratory failure) 07/05/2014  . GERD without esophagitis 07/05/2014  . Hypertension 07/05/2014  . Bipolar 1 disorder 07/05/2014  . Anxiety 07/05/2014  . Polysubstance abuse 07/05/2014  . Obesity (BMI 30-39.9) 07/05/2014  . Homeless 07/05/2014  . Encephalopathy, metabolic 81/19/1478    Resolved Hospital Problems   Diagnosis Date Noted Date Resolved  No resolved problems to display.    Discharge Condition: Improved, being discharged to shelter  Diet recommendation: Regular diet  Filed Weights   07/05/14 0726 07/05/14 1517  Weight: 97.07 kg (214 lb) 90.719 kg (200 lb)    History of present illness:  Patient is a 59 year old female with past mental history of tobacco abuse, reported COPD diagnosis, bipolar disorder and polysubstance abuse who was admitted to the hospitalist service on 1/1 for hypoxia. She was also noted to have some initial confusion and drowsiness attributed to taking of benzodiazepines.  Hospital Course:  Principal Problem:    ARF (acute respiratory failure): From COPD exacerbation: The following day, patient had mild leukocytosis  as well as wheezing on exam. Started on steroids plus nebs plus antibiotics and supplemental oxygen. Improved. With ambulation, oxygen saturations greater than 94% on room air Active Problems:    GERD without esophagitis: Stable. Patient on Carafate +2 different home medications for PPI. Discontinued dexilant   Hypertension: Patient will continue home meds. Metoprolol added for both heart rate control and better blood pressure control. Prescription given.    Bipolar 1 disorder: Stable.    Anxiety: See below, advised to decrease dose of Xanax    Polysubstance abuse: Urine drug and positive for benzos and opiates and cocaine. Patient counseled.    Obesity (BMI 30-39.9): Patient meets criteria with BMI greater than 30    Homeless: Asian states that her husband and she split up and she is homeless. Information given about shelter.    Encephalopathy, metabolic: Secondary to benzodiazepines, patient likely abusing. To prevent withdrawals and severe anxiety attacks, patient put on lower dose 0.5 mg 3 times a day    Tobacco abuse: Patient caught smoking in room. Cigarettes confiscated. Nicotine patch while in hospital   Procedures:  None  Consultations:  None  Discharge Exam: BP 108/58 mmHg  Pulse 72  Temp(Src) 97.4 F (36.3 C) (Oral)  Resp 20  Ht 5\' 4"  (1.626 m)  Wt 90.719 kg (200 lb)  BMI 34.31 kg/m2  SpO2 98%  General: Alert and oriented 3, no acute distress Cardiovascular: Regular rate and rhythm, S1-S2 Respiratory: Decreased breath sounds throughout  Discharge Instructions You were cared for by a hospitalist during your hospital stay. If you have any questions about your discharge medications or the care you received while you were in the hospital after you are  discharged, you can call the unit and asked to speak with the hospitalist on call if the hospitalist that took care of you is not available. Once you are discharged, your primary care physician will handle any  further medical issues. Please note that NO REFILLS for any discharge medications will be authorized once you are discharged, as it is imperative that you return to your primary care physician (or establish a relationship with a primary care physician if you do not have one) for your aftercare needs so that they can reassess your need for medications and monitor your lab values.  Discharge Instructions    Diet - low sodium heart healthy    Complete by:  As directed      Increase activity slowly    Complete by:  As directed             Medication List    STOP taking these medications        dexlansoprazole 60 MG capsule  Commonly known as:  DEXILANT      TAKE these medications        acetaminophen 500 MG tablet  Commonly known as:  TYLENOL  Take 500 mg by mouth every 6 (six) hours as needed.     albuterol (2.5 MG/3ML) 0.083% nebulizer solution  Commonly known as:  PROVENTIL  Take 3 mLs (2.5 mg total) by nebulization every 6 (six) hours as needed for wheezing or shortness of breath.     albuterol-ipratropium 18-103 MCG/ACT inhaler  Commonly known as:  COMBIVENT  Inhale 2 puffs into the lungs every 6 (six) hours as needed for shortness of breath.     ALPRAZolam 0.5 MG tablet  Commonly known as:  XANAX  Take 1 tablet (0.5 mg total) by mouth 4 (four) times daily as needed for anxiety.     amLODipine 10 MG tablet  Commonly known as:  NORVASC  Take 1 tablet (10 mg total) by mouth daily.     aspirin 81 MG chewable tablet  Chew 1 tablet (81 mg total) by mouth daily.     BREO ELLIPTA 100-25 MCG/INH Aepb  Generic drug:  Fluticasone Furoate-Vilanterol  Inhale 1 puff into the lungs daily.     estradiol 1 MG tablet  Commonly known as:  ESTRACE  Take 1 tablet (1 mg total) by mouth daily.     HYDROcodone-acetaminophen 10-325 MG per tablet  Commonly known as:  NORCO  Take 1 tablet by mouth every 6 (six) hours as needed for moderate pain or severe pain.     levofloxacin 500 MG  tablet  Commonly known as:  LEVAQUIN  Take 1 tablet (500 mg total) by mouth daily.  Start taking on:  07/08/2014     lisinopril 5 MG tablet  Commonly known as:  PRINIVIL,ZESTRIL  Take 5 mg by mouth daily.     medroxyPROGESTERone 2.5 MG tablet  Commonly known as:  PROVERA  Take 1 tablet (2.5 mg total) by mouth daily.     metoprolol tartrate 12.5 mg Tabs tablet  Commonly known as:  LOPRESSOR  Take 0.5 tablets (12.5 mg total) by mouth 2 (two) times daily.     nitroGLYCERIN 0.4 MG SL tablet  Commonly known as:  NITROSTAT  Place 1 tablet (0.4 mg total) under the tongue every 5 (five) minutes as needed for chest pain.     ondansetron 4 MG tablet  Commonly known as:  ZOFRAN  Take 1 tablet (4 mg total) by mouth every 8 (eight)  hours as needed for nausea or vomiting.     pantoprazole 40 MG tablet  Commonly known as:  PROTONIX  Take 1 tablet (40 mg total) by mouth 2 (two) times daily.     predniSONE 10 MG tablet  Commonly known as:  DELTASONE  30 mg on 1/4, then 20 on 1/5, then 10mg  on 1/6 and stop.     promethazine-dextromethorphan 6.25-15 MG/5ML syrup  Commonly known as:  PROMETHAZINE-DM  5 ml q6h prn cough     sucralfate 1 GM/10ML suspension  Commonly known as:  CARAFATE  Take 12mLs by mouth four times daily     theophylline 100 MG 12 hr tablet  Commonly known as:  THEODUR  Take 100 mg by mouth daily.       Allergies  Allergen Reactions  . Penicillins Other (See Comments)    REACTION: Unknown  . Septra [Sulfamethoxazole-Trimethoprim] Other (See Comments)    REACTION: Unknown      The results of significant diagnostics from this hospitalization (including imaging, microbiology, ancillary and laboratory) are listed below for reference.    Significant Diagnostic Studies: Dg Chest 2 View  07/05/2014   CLINICAL DATA:  Shortness of breath, wheezing, cough since last night  EXAM: CHEST  2 VIEW  COMPARISON:  April 28, 2014  FINDINGS: The heart size and mediastinal  contours are stable. The heart size is mildly enlarged. There is mild peribronchial thickening unchanged compared to prior exam. There is no focal pneumonia or pleural effusion. The visualized skeletal structures are stable.  IMPRESSION: No focal pneumonia. Mild peribronchial thickening unchanged compared to prior exam of April 28, 2014.   Electronically Signed   By: Abelardo Diesel M.D.   On: 07/05/2014 09:21   Ct Head Wo Contrast  07/05/2014   CLINICAL DATA:  Acute altered mental status this morning along with shortness of breath.  EXAM: CT HEAD WITHOUT CONTRAST  TECHNIQUE: Contiguous axial images were obtained from the base of the skull through the vertex without intravenous contrast.  COMPARISON:  09/13/2010  FINDINGS: The ventricles are normal in size and configuration. No extra-axial fluid collections are identified. The gray-white differentiation is normal. No CT findings for acute intracranial process such as hemorrhage or infarction. Stable area of low attenuation near the frontal horn of left lateral ventricle could be a remote white matter infarct or area of microvascular ischemic change. No mass lesions. The brainstem and cerebellum are grossly normal.  The bony structures are intact. The paranasal sinuses and mastoid air cells are clear. The globes are intact.  IMPRESSION: No acute intracranial findings or mass lesion.   Electronically Signed   By: Kalman Jewels M.D.   On: 07/05/2014 09:44    Microbiology: No results found for this or any previous visit (from the past 240 hour(s)).   Labs: Basic Metabolic Panel:  Recent Labs Lab 07/05/14 0751 07/05/14 1216 07/07/14 0539  NA 129* 132* 138  K 3.6 3.7 3.9  CL 94* 99 106  CO2 24 24 27   GLUCOSE 116* 166* 152*  BUN 10 11 14   CREATININE 0.52 0.56 0.57  CALCIUM 9.5 8.9 9.8   Liver Function Tests:  Recent Labs Lab 07/05/14 0751  AST 28  ALT 37*  ALKPHOS 99  BILITOT 0.6  PROT 7.4  ALBUMIN 4.4   No results for input(s):  LIPASE, AMYLASE in the last 168 hours. No results for input(s): AMMONIA in the last 168 hours. CBC:  Recent Labs Lab 07/05/14 0751 07/06/14 2297 07/07/14 0539  WBC 11.0* 13.9* 11.8*  NEUTROABS 7.3  --   --   HGB 15.9* 13.7 12.7  HCT 44.0 40.4 37.0  MCV 96.3 99.5 100.5*  PLT 278 239 207   Cardiac Enzymes:  Recent Labs Lab 07/05/14 0751 07/05/14 1216  TROPONINI <0.03 <0.03   BNP: BNP (last 3 results)  Recent Labs  03/06/14 1211 04/28/14 0026  PROBNP 102.5 243.2*   CBG: No results for input(s): GLUCAP in the last 168 hours.     Signed:  Annita Brod  [facility] 07/07/2014, 1:50 PM
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
## 2014-07-07 NOTE — Progress Notes (Signed)
Informed patient she was discharged home today. Patient states she can not return home with her spouse. Patient states she has no where to go. Patient ask about shelter information. Notified Education officer, museum at [employer] regarding resources in the area. Return call number given for [facility] homeless [phone]. No answer when number called.

## 2014-07-07 NOTE — Progress Notes (Signed)
Utilization review Completed Alivia Cimino RN BSN   

## 2014-07-07 NOTE — Social Work (Signed)
RN contacted CSW about housing needs for patient in [place].  CSW provided [place] for further support.  Patient currently dischargeing. No further CSW needs. Christene Lye MSW, [place]

## 2014-07-09 ENCOUNTER — Telehealth: Payer: Self-pay | Admitting: Gastroenterology

## 2014-07-09 ENCOUNTER — Encounter: Payer: Self-pay | Admitting: Gastroenterology

## 2014-07-09 ENCOUNTER — Ambulatory Visit: Payer: Medicaid Other | Admitting: Gastroenterology

## 2014-07-09 NOTE — Telephone Encounter (Signed)
PATIENT WAS A NO SHOW 07/09/14 LETTER SENT

## 2014-07-15 ENCOUNTER — Other Ambulatory Visit: Payer: Self-pay | Admitting: Internal Medicine

## 2014-07-17 ENCOUNTER — Emergency Department (HOSPITAL_COMMUNITY): Payer: Medicaid Other

## 2014-07-17 ENCOUNTER — Encounter (HOSPITAL_COMMUNITY): Payer: Self-pay

## 2014-07-17 ENCOUNTER — Emergency Department (HOSPITAL_COMMUNITY)
Admission: EM | Admit: 2014-07-17 | Discharge: 2014-07-18 | Disposition: A | Discharge status: Home / Self Care | Payer: Medicaid Other | Attending: Emergency Medicine | Admitting: Emergency Medicine

## 2014-07-17 DIAGNOSIS — J441 Chronic obstructive pulmonary disease with (acute) exacerbation: Secondary | ICD-10-CM | POA: Insufficient documentation

## 2014-07-17 DIAGNOSIS — Z7982 Long term (current) use of aspirin: Secondary | ICD-10-CM | POA: Insufficient documentation

## 2014-07-17 DIAGNOSIS — Z720 Tobacco use: Secondary | ICD-10-CM | POA: Insufficient documentation

## 2014-07-17 DIAGNOSIS — R5383 Other fatigue: Secondary | ICD-10-CM | POA: Insufficient documentation

## 2014-07-17 DIAGNOSIS — R079 Chest pain, unspecified: Secondary | ICD-10-CM

## 2014-07-17 DIAGNOSIS — R0789 Other chest pain: Secondary | ICD-10-CM | POA: Insufficient documentation

## 2014-07-17 DIAGNOSIS — I10 Essential (primary) hypertension: Secondary | ICD-10-CM | POA: Insufficient documentation

## 2014-07-17 DIAGNOSIS — F319 Bipolar disorder, unspecified: Secondary | ICD-10-CM | POA: Diagnosis not present

## 2014-07-17 DIAGNOSIS — Z880 Allergy status to penicillin: Secondary | ICD-10-CM | POA: Insufficient documentation

## 2014-07-17 DIAGNOSIS — F419 Anxiety disorder, unspecified: Secondary | ICD-10-CM | POA: Insufficient documentation

## 2014-07-17 DIAGNOSIS — Z79899 Other long term (current) drug therapy: Secondary | ICD-10-CM | POA: Insufficient documentation

## 2014-07-17 DIAGNOSIS — Z7951 Long term (current) use of inhaled steroids: Secondary | ICD-10-CM | POA: Diagnosis not present

## 2014-07-17 DIAGNOSIS — R531 Weakness: Secondary | ICD-10-CM | POA: Insufficient documentation

## 2014-07-17 LAB — CBC WITH DIFFERENTIAL/PLATELET
Basophils Absolute: 0 10*3/uL (ref 0.0–0.1)
Basophils Relative: 0 % (ref 0–1)
EOS ABS: 0.1 10*3/uL (ref 0.0–0.7)
EOS PCT: 0 % (ref 0–5)
HCT: 39.9 % (ref 36.0–46.0)
Hemoglobin: 13.7 g/dL (ref 12.0–15.0)
Lymphocytes Relative: 20 % (ref 12–46)
Lymphs Abs: 2.5 10*3/uL (ref 0.7–4.0)
MCH: 33.9 pg (ref 26.0–34.0)
MCHC: 34.3 g/dL (ref 30.0–36.0)
MCV: 98.8 fL (ref 78.0–100.0)
Monocytes Absolute: 1.1 10*3/uL — ABNORMAL HIGH (ref 0.1–1.0)
Monocytes Relative: 9 % (ref 3–12)
Neutro Abs: 8.9 10*3/uL — ABNORMAL HIGH (ref 1.7–7.7)
Neutrophils Relative %: 71 % (ref 43–77)
Platelets: 218 10*3/uL (ref 150–400)
RBC: 4.04 MIL/uL (ref 3.87–5.11)
RDW: 13.7 % (ref 11.5–15.5)
WBC: 12.6 10*3/uL — AB (ref 4.0–10.5)

## 2014-07-17 LAB — BASIC METABOLIC PANEL
ANION GAP: 7 (ref 5–15)
BUN: 9 mg/dL (ref 6–23)
CO2: 21 mmol/L (ref 19–32)
Calcium: 9.4 mg/dL (ref 8.4–10.5)
Chloride: 108 mEq/L (ref 96–112)
Creatinine, Ser: 0.56 mg/dL (ref 0.50–1.10)
GFR calc Af Amer: >90 mL/min (ref >90)
GFR calc non Af Amer: >90 mL/min (ref >90)
GLUCOSE: 109 mg/dL — AB (ref 70–99)
Potassium: 3.5 mmol/L (ref 3.5–5.1)
Sodium: 136 mmol/L (ref 135–145)

## 2014-07-17 LAB — TROPONIN I

## 2014-07-17 LAB — D-DIMER, QUANTITATIVE: D-Dimer, Quant: 0.37 ug/mL-FEU (ref 0.00–0.48)

## 2014-07-17 MED ORDER — ALPRAZOLAM 0.5 MG PO TABS
0.5000 mg | ORAL_TABLET | Freq: Once | ORAL | Status: AC
  Administered 2014-07-17: 0.5 mg via ORAL
  Filled 2014-07-17: qty 1

## 2014-07-17 MED ORDER — ALBUTEROL SULFATE (2.5 MG/3ML) 0.083% IN NEBU
5.0000 mg | INHALATION_SOLUTION | Freq: Once | RESPIRATORY_TRACT | Status: AC
  Administered 2014-07-17: 5 mg via RESPIRATORY_TRACT
  Filled 2014-07-17: qty 6

## 2014-07-17 NOTE — ED Provider Notes (Signed)
CSN: [id number]     Arrival date & time 07/17/14  2129 History  This chart was scribed for Johnna Acosta, MD by Tula Nakayama, ED Scribe. This patient was seen in room APA03/APA03 and the patient's care was started at 9:58 PM.    Chief Complaint  Patient presents with  . Chest Pain   The history is provided by the patient. No language interpreter was used.    HPI Comments: [name] is a 59 y.o. female with a history of COPD who presents to the Emergency Department complaining of constant, gradually worsening, pressure-like, left-sided chest discomfort that started earlier today. Pt states gradually worsening SOB, weakness and fatigue that started yesterday as associated symptoms. She notes symptoms become worse with ambulation. Pt states current symptoms are inconsistent with history of COPD. She has a history of intermittent left-sided chest tightness that is usually relieved by NTG, but tried 3 NTG and an albuterol treatment PTA with no relief to symptoms. Pt had a stress test at [facility] a few years ago and was advised to follow-up with a cardiologist, but did not comply. She smokes and takes estrogen. Pt denies nausea and vomiting.   Pulmonologist [facility] PCP Gerarda Fraction  Past Medical History  Diagnosis Date  . COPD (chronic obstructive pulmonary disease)   . Hypertension   . Bipolar 1 disorder   . Anxiety   . Depression    Past Surgical History  Procedure Laterality Date  . Kidney stone surgery    . Cholecystectomy    . Hernia repair    . Colonoscopy  July 2010    Dr. Arnoldo Morale: 3 rectal polyps, not enough tissue for pathologic examination, recommended surveillance in 3 years  . Esophagogastroduodenoscopy  July 2010    Dr. Arnoldo Morale: gastritis and duodenitis, H.pylori negative   Family History  Problem Relation Age of Onset  . COPD Mother   . Stroke    . Colon cancer      adopted, does not know family history    History  Substance Use Topics  . Smoking status:  Current Some Day Smoker    Types: Cigarettes  . Smokeless tobacco: Former Systems developer     Comment: smokes a cigarette here and there  . Alcohol Use: No   OB History    No data available     Review of Systems  Constitutional: Positive for fatigue.  Respiratory: Positive for chest tightness and shortness of breath.   Cardiovascular: Positive for chest pain.  Gastrointestinal: Negative for nausea and vomiting.  Neurological: Positive for weakness.  All other systems reviewed and are negative.  Allergies  Penicillins and Septra  Home Medications   Prior to Admission medications   Medication Sig Start Date End Date Taking? Authorizing Provider  albuterol (PROVENTIL) (2.5 MG/3ML) 0.083% nebulizer solution Take 3 mLs (2.5 mg total) by nebulization every 6 (six) hours as needed for wheezing or shortness of breath. 07/12/13  Yes Thurnell Lose, MD  albuterol-ipratropium (COMBIVENT) 18-103 MCG/ACT inhaler Inhale 2 puffs into the lungs every 6 (six) hours as needed for shortness of breath.    Yes Historical Provider, MD  alprazolam Duanne Moron) 2 MG tablet Take 2 mg by mouth 3 (three) times daily as needed for anxiety.   Yes Historical Provider, MD  aspirin 81 MG chewable tablet Chew 1 tablet (81 mg total) by mouth daily. 03/08/14  Yes Nita Sells, MD  dexlansoprazole (DEXILANT) 60 MG capsule Take 60 mg by mouth daily.   Yes Historical  Provider, MD  estradiol (ESTRACE) 1 MG tablet Take 1 tablet (1 mg total) by mouth daily. 09/11/13  Yes Florian Buff, MD  Fluticasone Furoate-Vilanterol (BREO ELLIPTA) 100-25 MCG/INH AEPB Inhale 1 puff into the lungs daily.   Yes Historical Provider, MD  HYDROcodone-acetaminophen (NORCO) 10-325 MG per tablet Take 1 tablet by mouth every 6 (six) hours as needed for moderate pain or severe pain.   Yes Historical Provider, MD  lisinopril (PRINIVIL,ZESTRIL) 5 MG tablet Take 5 mg by mouth daily.   Yes Historical Provider, MD  medroxyPROGESTERone (PROVERA) 2.5 MG tablet Take  1 tablet (2.5 mg total) by mouth daily. 09/11/13  Yes Florian Buff, MD  metoprolol tartrate (LOPRESSOR) 25 MG tablet Take 12.5 mg by mouth daily.   Yes Historical Provider, MD  nitroGLYCERIN (NITROSTAT) 0.4 MG SL tablet Place 1 tablet (0.4 mg total) under the tongue every 5 (five) minutes as needed for chest pain. 03/14/14  Yes Arnoldo Lenis, MD  sucralfate (CARAFATE) 1 GM/10ML suspension Take 6mLs by mouth four times daily 07/03/14  Yes Ranae Pila, NP  theophylline (THEODUR) 100 MG 12 hr tablet Take 100 mg by mouth daily.    Yes Historical Provider, MD  acetaminophen (TYLENOL) 500 MG tablet Take 500 mg by mouth every 6 (six) hours as needed (pain).     Historical Provider, MD  alprazolam Duanne Moron) 0.5 MG tablet Take 1 tablet (0.5 mg total) by mouth 4 (four) times daily as needed for anxiety. Patient not taking: Reported on 07/17/2014 07/07/14   Annita Brod, MD  amLODipine (NORVASC) 10 MG tablet Take 1 tablet (10 mg total) by mouth daily. Patient not taking: Reported on 07/17/2014 07/12/13   Thurnell Lose, MD  metoprolol tartrate (LOPRESSOR) 12.5 mg TABS tablet Take 0.5 tablets (12.5 mg total) by mouth 2 (two) times daily. Patient not taking: Reported on 07/17/2014 07/07/14   Annita Brod, MD  naproxen (NAPROSYN) 500 MG tablet Take 1 tablet (500 mg total) by mouth 2 (two) times daily with a meal. 07/18/14   Johnna Acosta, MD  ondansetron (ZOFRAN) 4 MG tablet Take 1 tablet (4 mg total) by mouth every 8 (eight) hours as needed for nausea or vomiting. Patient not taking: Reported on 07/17/2014 03/26/14   Orvil Feil, NP  pantoprazole (PROTONIX) 40 MG tablet Take 1 tablet (40 mg total) by mouth 2 (two) times daily. Patient not taking: Reported on 07/17/2014 03/08/14   Nita Sells, MD  predniSONE (DELTASONE) 10 MG tablet 30 mg on 1/4, then 20 on 1/5, then 10mg  on 1/6 and stop. Patient not taking: Reported on 07/17/2014 07/07/14   Annita Brod, MD  promethazine-dextromethorphan  (PROMETHAZINE-DM) 6.25-15 MG/5ML syrup 5 ml q6h prn cough Patient not taking: Reported on 07/17/2014 04/28/14   Lenox Ahr, PA-C   BP 121/56 mmHg  Pulse 65  Temp(Src) 98.1 F (36.7 C) (Oral)  Resp 22  Ht 5\' 4"  (1.626 m)  Wt 200 lb (90.719 kg)  BMI 34.31 kg/m2  SpO2 96% Physical Exam  Constitutional: She appears well-developed and well-nourished. No distress.  HENT:  Head: Normocephalic and atraumatic.  Mouth/Throat: Oropharynx is clear and moist. No oropharyngeal exudate.  Eyes: Conjunctivae and EOM are normal. Pupils are equal, round, and reactive to light. Right eye exhibits no discharge. Left eye exhibits no discharge. No scleral icterus.  Neck: Normal range of motion. Neck supple. No JVD present. No thyromegaly present.  Cardiovascular: Normal rate, regular rhythm, normal heart sounds and intact  distal pulses.  Exam reveals no gallop and no friction rub.   No murmur heard. Pulmonary/Chest: Effort normal and breath sounds normal. No respiratory distress. She has no wheezes. She has no rales.  Mildly tachypneic; appears mildly dyspneic; no wheezing or rales  Abdominal: Soft. Bowel sounds are normal. She exhibits no distension and no mass. There is no tenderness.  Musculoskeletal: Normal range of motion. She exhibits no edema or tenderness.  Lymphadenopathy:    She has no cervical adenopathy.  Neurological: She is alert. Coordination normal.  Skin: Skin is warm and dry. No rash noted. No erythema.  Psychiatric: She has a normal mood and affect. Her behavior is normal.  Nursing note and vitals reviewed.   ED Course  Procedures (including critical care time) DIAGNOSTIC STUDIES: Oxygen Saturation is 94% on RA, normal by my interpretation.    COORDINATION OF CARE: 10:11 PM Discussed treatment plan with pt which includes lab work. Pt agreed to plan.  Labs Review Labs Reviewed  CBC WITH DIFFERENTIAL - Abnormal; Notable for the following:    WBC 12.6 (*)    Neutro Abs 8.9  (*)    Monocytes Absolute 1.1 (*)    All other components within normal limits  BASIC METABOLIC PANEL - Abnormal; Notable for the following:    Glucose, Bld 109 (*)    All other components within normal limits  TROPONIN I  D-DIMER, QUANTITATIVE  TROPONIN I    Imaging Review No results found.  ED ECG REPORT  I personally interpreted this EKG   Date: 07/18/2014   Rate: 71  Rhythm: normal sinus rhythm  QRS Axis: normal  Intervals: normal  ST/T Wave abnormalities: normal  Conduction Disutrbances:none  Narrative Interpretation:   Old EKG Reviewed: unchanged   MDM   Final diagnoses:  Chest pain   The patient had a stress test performed in September 2015, she was having similar symptoms as this. The stress test was normal. Her d-dimer is normal, her troponin is normal, her x-ray shows no significant findings, her white blood cell count is 12,600, this is nonspecific, basic metabolic panel is also normal.   Labs have been totally unremarkable, chest x-ray pending, EKG totally normal, patient still having a chest heaviness, she is low risk but not no risk, chest x-ray pending, second troponin has been drawn, change of shift.  Care signed out to Dr. Tomi Bamberger  I personally performed the services described in this documentation, which was scribed in my presence. The recorded information has been reviewed and is accurate.     Johnna Acosta, MD 07/18/14 [phone]

## 2014-07-17 NOTE — ED Notes (Signed)
Patient ambulatory to restroom  ?

## 2014-07-17 NOTE — ED Notes (Signed)
Patient states chest pain that started one hour PTA. Patient states central chest pressure with dizziness and weakness. Patient has had 4 NTG prior to arrival and 325ASA per [facility].

## 2014-07-18 LAB — TROPONIN I: Troponin I: <0.03 ng/mL (ref <0.031)

## 2014-07-18 MED ORDER — NAPROXEN 500 MG PO TABS: 500.0000 mg | ORAL_TABLET | Freq: Two times a day (BID) | ORAL | Status: DC

## 2014-07-18 NOTE — ED Provider Notes (Signed)
Pt left at change of shift to get results of second troponin. Pt given her test result. Feels ready to go home.     Results for orders placed or performed during the hospital encounter of 07/17/14  CBC with Differential  Result Value Ref Range   WBC 12.6 (H) 4.0 - 10.5 K/uL   RBC 4.04 3.87 - 5.11 MIL/uL   Hemoglobin 13.7 12.0 - 15.0 g/dL   HCT 39.9 36.0 - 46.0 %   MCV 98.8 78.0 - 100.0 fL   MCH 33.9 26.0 - 34.0 pg   MCHC 34.3 30.0 - 36.0 g/dL   RDW 13.7 11.5 - 15.5 %   Platelets 218 150 - 400 K/uL   Neutrophils Relative % 71 43 - 77 %   Neutro Abs 8.9 (H) 1.7 - 7.7 K/uL   Lymphocytes Relative 20 12 - 46 %   Lymphs Abs 2.5 0.7 - 4.0 K/uL   Monocytes Relative 9 3 - 12 %   Monocytes Absolute 1.1 (H) 0.1 - 1.0 K/uL   Eosinophils Relative 0 0 - 5 %   Eosinophils Absolute 0.1 0.0 - 0.7 K/uL   Basophils Relative 0 0 - 1 %   Basophils Absolute 0.0 0.0 - 0.1 K/uL  Basic metabolic panel  Result Value Ref Range   Sodium 136 135 - 145 mmol/L   Potassium 3.5 3.5 - 5.1 mmol/L   Chloride 108 96 - 112 mEq/L   CO2 21 19 - 32 mmol/L   Glucose, Bld 109 (H) 70 - 99 mg/dL   BUN 9 6 - 23 mg/dL   Creatinine, Ser 0.56 0.50 - 1.10 mg/dL   Calcium 9.4 8.4 - 10.5 mg/dL   GFR calc non Af Amer >90 >90 mL/min   GFR calc Af Amer >90 >90 mL/min   Anion gap 7 5 - 15  Troponin I  Result Value Ref Range   Troponin I <0.03 <0.031 ng/mL  D-dimer, quantitative  Result Value Ref Range   D-Dimer, Quant 0.37 0.00 - 0.48 ug/mL-FEU  Troponin I  Result Value Ref Range   Troponin I <0.03 <0.031 ng/mL    Laboratory interpretation all normal except leukocytosis  Dg Chest 2 View  07/05/2014   CLINICAL DATA:  Shortness of breath, wheezing, cough since last night  EXAM: CHEST  2 VIEW  COMPARISON:  April 28, 2014  FINDINGS: The heart size and mediastinal contours are stable. The heart size is mildly enlarged. There is mild peribronchial thickening unchanged compared to prior exam. There is no focal pneumonia or  pleural effusion. The visualized skeletal structures are stable.  IMPRESSION: No focal pneumonia. Mild peribronchial thickening unchanged compared to prior exam of April 28, 2014.   Electronically Signed   By: Abelardo Diesel M.D.   On: 07/05/2014 09:21   Ct Head Wo Contrast  07/05/2014   CLINICAL DATA:  Acute altered mental status this morning along with shortness of breath.  EXAM: CT HEAD WITHOUT CONTRAST  TECHNIQUE: Contiguous axial images were obtained from the base of the skull through the vertex without intravenous contrast.  COMPARISON:  09/13/2010  FINDINGS: The ventricles are normal in size and configuration. No extra-axial fluid collections are identified. The gray-white differentiation is normal. No CT findings for acute intracranial process such as hemorrhage or infarction. Stable area of low attenuation near the frontal horn of left lateral ventricle could be a remote white matter infarct or area of microvascular ischemic change. No mass lesions. The brainstem and cerebellum are grossly normal.  The bony structures are intact. The paranasal sinuses and mastoid air cells are clear. The globes are intact.  IMPRESSION: No acute intracranial findings or mass lesion.   Electronically Signed   By: Kalman Jewels M.D.   On: 07/05/2014 09:44   Rolland Porter, MD, Alanson Aly, MD 07/18/14 [phone]

## 2014-07-18 NOTE — Discharge Instructions (Signed)
Please call your doctor for a followup appointment within 24-48 hours. When you talk to your doctor please let them know that you were seen in the emergency department and have them acquire all of your records so that they can discuss the findings with you and formulate a treatment plan to fully care for your new and ongoing problems. ° °

## 2014-07-18 NOTE — ED Notes (Signed)
Patient verbalizes understanding of discharge instructions, prescription medications, and follow up care. Patient ambulatory out of department at this time. 

## 2014-07-30 ENCOUNTER — Other Ambulatory Visit: Payer: Self-pay

## 2014-07-31 MED ORDER — DEXLANSOPRAZOLE 60 MG PO CPDR: 60.0000 mg | DELAYED_RELEASE_CAPSULE | Freq: Every day | ORAL | Status: DC

## 2014-08-13 ENCOUNTER — Encounter: Payer: Medicaid Other | Admitting: Adult Health

## 2014-08-13 NOTE — Progress Notes (Signed)
   Error Cancelled Appt Phone: [phone]; Fax: [phone]

## 2014-08-26 ENCOUNTER — Other Ambulatory Visit: Payer: Self-pay

## 2014-08-29 MED ORDER — DEXLANSOPRAZOLE 60 MG PO CPDR: 60.0000 mg | DELAYED_RELEASE_CAPSULE | Freq: Every day | ORAL | Status: DC

## 2014-09-05 ENCOUNTER — Encounter: Payer: Self-pay | Admitting: Adult Health

## 2014-09-05 ENCOUNTER — Encounter: Payer: Medicaid Other | Admitting: Adult Health

## 2014-09-05 NOTE — Progress Notes (Signed)
Cardiology Office Note  Error NO SHOW

## 2014-09-10 ENCOUNTER — Other Ambulatory Visit: Payer: Self-pay | Admitting: Nurse Practitioner

## 2014-09-10 ENCOUNTER — Ambulatory Visit: Payer: Medicaid Other | Admitting: Gastroenterology

## 2014-09-10 ENCOUNTER — Telehealth: Payer: Self-pay | Admitting: Internal Medicine

## 2014-09-10 NOTE — Telephone Encounter (Signed)
PATIENT CALLED AGAIN REGARDING HER PRESCRIPTION.

## 2014-09-10 NOTE — Telephone Encounter (Signed)
RX for carafate done as this is what the pharmacy sent. Patient needs to keep OV.

## 2014-09-17 ENCOUNTER — Emergency Department (HOSPITAL_COMMUNITY): Payer: Medicaid Other

## 2014-09-17 ENCOUNTER — Encounter (HOSPITAL_COMMUNITY): Payer: Self-pay | Admitting: *Deleted

## 2014-09-17 ENCOUNTER — Emergency Department (HOSPITAL_COMMUNITY)
Admission: EM | Admit: 2014-09-17 | Discharge: 2014-09-18 | Disposition: A | Discharge status: Home / Self Care | Payer: Medicaid Other | Attending: Emergency Medicine | Admitting: Emergency Medicine

## 2014-09-17 DIAGNOSIS — Z7951 Long term (current) use of inhaled steroids: Secondary | ICD-10-CM | POA: Diagnosis not present

## 2014-09-17 DIAGNOSIS — F329 Major depressive disorder, single episode, unspecified: Secondary | ICD-10-CM | POA: Insufficient documentation

## 2014-09-17 DIAGNOSIS — F10120 Alcohol abuse with intoxication, uncomplicated: Secondary | ICD-10-CM | POA: Diagnosis not present

## 2014-09-17 DIAGNOSIS — J441 Chronic obstructive pulmonary disease with (acute) exacerbation: Secondary | ICD-10-CM | POA: Diagnosis not present

## 2014-09-17 DIAGNOSIS — R079 Chest pain, unspecified: Secondary | ICD-10-CM

## 2014-09-17 DIAGNOSIS — F10920 Alcohol use, unspecified with intoxication, uncomplicated: Secondary | ICD-10-CM

## 2014-09-17 DIAGNOSIS — R0789 Other chest pain: Secondary | ICD-10-CM | POA: Diagnosis not present

## 2014-09-17 DIAGNOSIS — F419 Anxiety disorder, unspecified: Secondary | ICD-10-CM | POA: Insufficient documentation

## 2014-09-17 DIAGNOSIS — Z720 Tobacco use: Secondary | ICD-10-CM | POA: Diagnosis not present

## 2014-09-17 DIAGNOSIS — Z880 Allergy status to penicillin: Secondary | ICD-10-CM | POA: Insufficient documentation

## 2014-09-17 DIAGNOSIS — Z79899 Other long term (current) drug therapy: Secondary | ICD-10-CM | POA: Insufficient documentation

## 2014-09-17 DIAGNOSIS — Z7982 Long term (current) use of aspirin: Secondary | ICD-10-CM | POA: Insufficient documentation

## 2014-09-17 DIAGNOSIS — I10 Essential (primary) hypertension: Secondary | ICD-10-CM | POA: Diagnosis not present

## 2014-09-17 LAB — URINALYSIS, ROUTINE W REFLEX MICROSCOPIC
Bilirubin Urine: NEGATIVE
Glucose, UA: NEGATIVE mg/dL
Hgb urine dipstick: NEGATIVE
KETONES UR: NEGATIVE mg/dL
LEUKOCYTES UA: NEGATIVE
NITRITE: NEGATIVE
PROTEIN: NEGATIVE mg/dL
Specific Gravity, Urine: <1.005 — ABNORMAL LOW (ref 1.005–1.030)
Urobilinogen, UA: 0.2 mg/dL (ref 0.0–1.0)
pH: 6 (ref 5.0–8.0)

## 2014-09-17 LAB — CBC WITH DIFFERENTIAL/PLATELET
BASOS ABS: 0.1 10*3/uL (ref 0.0–0.1)
Basophils Relative: 1 % (ref 0–1)
EOS ABS: 0.1 10*3/uL (ref 0.0–0.7)
Eosinophils Relative: 1 % (ref 0–5)
HEMATOCRIT: 42.3 % (ref 36.0–46.0)
Hemoglobin: 14.9 g/dL (ref 12.0–15.0)
LYMPHS PCT: 41 % (ref 12–46)
Lymphs Abs: 4 10*3/uL (ref 0.7–4.0)
MCH: 34.3 pg — ABNORMAL HIGH (ref 26.0–34.0)
MCHC: 35.2 g/dL (ref 30.0–36.0)
MCV: 97.5 fL (ref 78.0–100.0)
Monocytes Absolute: 1 10*3/uL (ref 0.1–1.0)
Monocytes Relative: 10 % (ref 3–12)
NEUTROS ABS: 4.8 10*3/uL (ref 1.7–7.7)
Neutrophils Relative %: 47 % (ref 43–77)
PLATELETS: 250 10*3/uL (ref 150–400)
RBC: 4.34 MIL/uL (ref 3.87–5.11)
RDW: 15 % (ref 11.5–15.5)
WBC: 10 10*3/uL (ref 4.0–10.5)

## 2014-09-17 LAB — BASIC METABOLIC PANEL
ANION GAP: 11 (ref 5–15)
BUN: 7 mg/dL (ref 6–23)
CHLORIDE: 104 mmol/L (ref 96–112)
CO2: 21 mmol/L (ref 19–32)
Calcium: 9.3 mg/dL (ref 8.4–10.5)
Creatinine, Ser: 0.69 mg/dL (ref 0.50–1.10)
Glucose, Bld: 97 mg/dL (ref 70–99)
Potassium: 3.3 mmol/L — ABNORMAL LOW (ref 3.5–5.1)
Sodium: 136 mmol/L (ref 135–145)

## 2014-09-17 LAB — ETHANOL: Alcohol, Ethyl (B): 279 mg/dL — ABNORMAL HIGH (ref 0–9)

## 2014-09-17 MED ORDER — ALBUTEROL SULFATE HFA 108 (90 BASE) MCG/ACT IN AERS
2.0000 | INHALATION_SPRAY | Freq: Once | RESPIRATORY_TRACT | Status: AC
  Administered 2014-09-18: 2 via RESPIRATORY_TRACT
  Filled 2014-09-17: qty 6.7

## 2014-09-17 MED ORDER — GI COCKTAIL ~~LOC~~
30.0000 mL | Freq: Once | ORAL | Status: AC
  Administered 2014-09-17: 30 mL via ORAL
  Filled 2014-09-17: qty 30
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
## 2014-09-18 ENCOUNTER — Encounter (HOSPITAL_COMMUNITY): Payer: Self-pay | Admitting: *Deleted

## 2014-09-18 ENCOUNTER — Emergency Department (HOSPITAL_COMMUNITY)
Admission: EM | Admit: 2014-09-18 | Discharge: 2014-09-18 | Disposition: A | Discharge status: Home / Self Care | Payer: Medicaid Other | Source: Home / Self Care | Attending: Emergency Medicine | Admitting: Emergency Medicine

## 2014-09-18 DIAGNOSIS — J449 Chronic obstructive pulmonary disease, unspecified: Secondary | ICD-10-CM

## 2014-09-18 DIAGNOSIS — Z7951 Long term (current) use of inhaled steroids: Secondary | ICD-10-CM

## 2014-09-18 DIAGNOSIS — Z7982 Long term (current) use of aspirin: Secondary | ICD-10-CM

## 2014-09-18 DIAGNOSIS — R079 Chest pain, unspecified: Secondary | ICD-10-CM | POA: Insufficient documentation

## 2014-09-18 DIAGNOSIS — F319 Bipolar disorder, unspecified: Secondary | ICD-10-CM | POA: Insufficient documentation

## 2014-09-18 DIAGNOSIS — Z880 Allergy status to penicillin: Secondary | ICD-10-CM | POA: Insufficient documentation

## 2014-09-18 DIAGNOSIS — I10 Essential (primary) hypertension: Secondary | ICD-10-CM | POA: Insufficient documentation

## 2014-09-18 DIAGNOSIS — F419 Anxiety disorder, unspecified: Secondary | ICD-10-CM

## 2014-09-18 DIAGNOSIS — Z79899 Other long term (current) drug therapy: Secondary | ICD-10-CM

## 2014-09-18 DIAGNOSIS — Z5900 Homelessness unspecified: Secondary | ICD-10-CM

## 2014-09-18 DIAGNOSIS — Z720 Tobacco use: Secondary | ICD-10-CM | POA: Insufficient documentation

## 2014-09-18 LAB — TROPONIN I

## 2014-09-18 NOTE — Discharge Instructions (Signed)
°Emergency Department Resource Guide °1) Find a Doctor and Pay Out of Pocket °Although you won't have to find out who is covered by your insurance plan, it is a good idea to ask around and get recommendations. You will then need to call the office and see if the doctor you have chosen will accept you as a new patient and what types of options they offer for patients who are self-pay. Some doctors offer discounts or will set up payment plans for their patients who do not have insurance, but you will need to ask so you aren't surprised when you get to your appointment. ° °2) Contact Your Local Health Department °Not all health departments have doctors that can see patients for sick visits, but many do, so it is worth a call to see if yours does. If you don't know where your local health department is, you can check in your phone book. The CDC also has a tool to help you locate your state's health department, and many state websites also have listings of all of their local health departments. ° °3) Find a Walk-in Clinic °If your illness is not likely to be very severe or complicated, you may want to try a walk in clinic. These are popping up all over the country in pharmacies, drugstores, and shopping centers. They're usually staffed by nurse practitioners or physician assistants that have been trained to treat common illnesses and complaints. They're usually fairly quick and inexpensive. However, if you have serious medical issues or chronic medical problems, these are probably not your best option. ° °No Primary Care Doctor: °- Call [facility] at  832-8000 - they can help you locate a primary care doctor that  accepts your insurance, provides certain services, etc. °- Physician Referral Service- 1-800-533-3463 ° °Chronic Pain Problems: °Organization         Address  Phone   Notes  °[facility]  (336) 297-2271 Patients need to be referred by their primary care doctor.  ° °Medication  Assistance: °Organization         Address  Phone   Notes  °[place] [facility] [address] °[place], [place] [address] (336) 641-8030 --Must be a resident of [place] °-- Must have NO insurance coverage whatsoever (no Medicaid/ Medicare, etc.) °-- The pt. MUST have a primary care doctor that directs their care regularly and follows them in the community °  °[facility]  (866) 331-1348   °[employer]  (888) 892-1162   ° °Agencies that provide inexpensive medical care: °Organization         Address  Phone   Notes  °[facility]  (336) 832-8035   ° Internal Medicine    (336) 832-7272   °[facility] [address] °[place], [place] [address] (336) 832-4777   °[facility] [address], °[place] (336) 271-4999   °[facility]    (336) 373-0678   °[facility]    (336) 272-1050   °[facility] ° [address], [place] Phone:  (336) 832-4444, Fax:  (336) 832-4440 Hours of Operation:  9 am - 6 pm, M-F.  Also accepts Medicaid/Medicare and self-pay.  °[facility] ° [address], [place] Phone: (336) 832-3150, Fax: (336) 832-3151. Hours of Operation:  8:30 am - 5:30 pm, M-F.  Also accepts Medicaid and self-pay.  °[facility] [place] [address], [place] Phone: (336) 878-6027   °[facility] [address], [place], [place] (336)723-1848, Ext. 123 Mondays & Thursdays: 7-9 AM.  First 15 patients are seen on a first come, first serve basis. °  ° °Medicaid-accepting [place] Providers: ° °Organization         Address  Phone   Notes  °[facility] [address], [place] (336) 641-2100 Also accepts self-pay patients.  °[facility] [address], [place] ° (336) 856-9996   °[facility] [address] [place] [address], [place]  (336) 288-8857   °[facility] [address] [place] [address], [place] (336) 299-7000   °Veita Bland [address], [place]  ° (336) 373-1557 Only accepts [place] Access Medicaid patients after they have their name applied to their card.  ° °Self-Pay (no insurance) in [place]: ° °Organization         Address  Phone   Notes  °Sickle Cell Patients, [facility] [address], [place] (336) 832-1970   °[facility] [address], [place] (336) 832-4400   °[facility] ° [address], [place] (336) 992-4800   °[facility]/Dr. Osei-Bonsu ° [address] [place] [address], [place] or [address], [place] (336) 841-8500 Phone number for both [place] and [place] locations is the same.  °[facility] [address], [place] (336) 299-0000   °[facility] [address] [place] [address], [place] or [address] (336) 852-7530 °(336) 878-2260   °[facility] [address],  (336) 350-1642, phone; (336) 294-5005, fax Sees patients 1st and 3rd Saturday of every month.  Must not qualify for public or private insurance (i.e. Medicaid, Medicare, [place] Health Choice, Veterans' Benefits) • Household income should be no more than 200% of the poverty level •The clinic cannot treat you if you are pregnant or think you are pregnant • Sexually transmitted diseases are not treated at the clinic.  ° ° °Dental Care: °Organization         Address  Phone  Notes  °[place] [facility] [address],  (336) 641-6152 Accepts children up to age 21 who are enrolled in Medicaid or [place] Health Choice; pregnant women with a Medicaid card; and children who have applied for Medicaid or [place] Health Choice, but were declined, whose parents can pay a reduced fee at time of service.  °[place]  [facility] [place]  [address], [place] (336) 641-7733 Accepts children up to age 21 who are enrolled in Medicaid or [place] Health Choice; pregnant women with a Medicaid card; and children who have applied for Medicaid or [place] Health Choice, but were declined, whose parents can pay a reduced fee at time of service.  °[facility] ° [address],  (336) 641-4533 Patients are seen by appointment only. Walk-ins are not accepted. [facility] will see patients 18 years of age and older. °Monday - Tuesday (8am-5pm) °Most Wednesdays (8:30-5pm) °$30 per visit, cash only  °[facility] ° [address], [place] (336) 641-4533 Patients are seen by appointment only. Walk-ins are not accepted. [facility] will see patients 18 years of age and older. °One   Wednesday Evening (Monthly: Volunteer Based).  $30 per visit, cash only  °[facility]  (919) 537-3737 for adults; Children under age 4, call Graduate Pediatric Dentistry at (919) [phone]. Children aged 4-14, please call (919) [phone] to request a pediatric application. ° Dental services are provided in all areas of dental care including fillings, crowns and bridges, complete and partial dentures, implants, gum treatment, root canals, and extractions. Preventive care is also provided. Treatment is provided to both adults and children. °Patients are selected via a lottery and there is often a waiting list. °  °[facility] [address], °[place] ° (336) 763-8833 [email or web address] °  °[facility] [address], [place], [place] (336)723-1848, Ext. 123 Second and Fourth Thursday of each month, opens at 6:30 AM; Clinic ends at 9 AM.  Patients are seen on a first-come first-served basis, and a limited number are seen during each clinic.  ° °[facility] ° [address], [place], [place] (336) 723-7904    Eligibility Requirements °You must have lived in [place], [place], or [place] counties for at least the last three months. °  You cannot be eligible for state or federal sponsored healthcare insurance, including Veterans Administration, Medicaid, or Medicare. °  You generally cannot be eligible for healthcare insurance through your employer.  °  How to apply: °Eligibility screenings are held every Tuesday and Wednesday afternoon from 1:00 pm until 4:00 pm. You do not need an appointment for the interview!  °[facility] [address], [place], [place] 336-631-2330   °[facility]  336-342-8273   °[facility]  336-703-3100   °[facility]  336-570-6415   ° °Behavioral Health Resources in the Community: °Intensive Outpatient Programs °Organization         Address  Phone  Notes  °[facility] [address], [place], [place] 336-878-6098   °[facility] [address], [place], [place] 336-832-9800   °[facility] [address], [place], [place] ° 336-882-2125   °[facility] [address],  °[place], [place] 1-800-853-5163 or 336-641-4981   °Substance Abuse Resources °Organization         Address  Phone  Notes  °[facility]  336-882-2125   °[facility]  336-784-9470   °[facility]  336-285-9073   °[facility]  336-845-3988   °Residential & Outpatient Substance Abuse Program  1-800-659-3381   °Psychological Services °Organization         Address  Phone  Notes  °[facility]  336- 832-9600   °[facility]  336- 378-7881   °[facility] [address], [place] 1-800-853-5163 or 336-641-4981   ° °Mobile Crisis Teams °Organization         Address  Phone  Notes  °[facility], Mobile Crisis Care Unit  1-877-626-1772   °Assertive °[facility] ° [address]  [place], [place] 336-834-9664   °Sharon DeEsch [address] °[place] 336-554-5454   ° °Self-Help/Support Groups °Organization         Address  Phone             Notes  °[facility] [place] - variety of support groups  336- 373-1402 Call for more information  °Narcotics Anonymous (NA), [facility] [address], °[place]   2 meetings at this location  ° °  Residential Treatment Programs °Organization         Address  Phone  Notes  °[facility] [address],    °[place]  1-866-801-8205   °[facility] ° [address], [place], [place] 704-293-8524   °[facility] [address], [place] 336-845-3988 Admissions: 8am-3pm M-F  °[facility] [address]    °[place], [place] 336-841-1104   °[facility] [address], [place], [place] 336-379-7146   °[facility] [address]  °[place], [place] 336-285-9073   °[facility] - Intensive Outpatient [address], [place], [place] 336-852-3033   °[facility] [address]  °[place], [place] 1-877-615-2722 or 336-784-9470   °[facility] [address] [place], [place] 336-227-7417 Accepts Medicaid  °[facility] [address]  °[place] 1-800-659-3381 Substance Abuse/Addiction Treatment  ° °[place] Behavioral Health Resources °Organization         Address  Phone  Notes  °[facility]  (888) 581-9988   °Julie Brannon, PhD [address] [place], [place]   (336) 349-5553 or (336) 951-0000   ° Behavioral   [address] °[place], [place] (336) 349-4454   °[facility] [address], [place], [place] (336) 342-8316 Insurance/Medicaid/sponsorship through [facility]  °[facility] [address]                                    [place], [place] (336) 342-8316 Therapy/tele-psych/case    °[facility] [address]  ° [place],  (336) 349-2233    °Dr. Arfeen  (336) 349-4544   °[facility] [place]  [employer] [place] [facility] 1) [address], [place] °2) [address], [place] °3)  [address], [place] (336) 349-3220 °(336) 342-7768 ° °(336) 342-8140   °[place] [facility] (336) 342-1394 or (336) 342-3537 (After Hours)    ° ° °

## 2014-09-18 NOTE — ED Notes (Signed)
Pt c/o chest pain; was just discharged for same complaint; pt + for ETOH
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
## 2014-09-18 NOTE — ED Notes (Signed)
Pt. Reports chest pain similar to episode earlier tonight. Pt. Reports nausea but denies any vomiting/diarrhea. Pt. Reports some shortness of breath. Pt. Specifically denies any alcohol or drug use tonight. No acute distress noted.
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
## 2014-09-18 NOTE — Discharge Instructions (Signed)
Alcohol Intoxication Alcohol intoxication occurs when the amount of alcohol that a person has consumed impairs his or her ability to mentally and physically function. Alcohol directly impairs the normal chemical activity of the brain. Drinking large amounts of alcohol can lead to changes in mental function and behavior, and it can cause many physical effects that can be harmful.  Alcohol intoxication can range in severity from mild to very severe. Various factors can affect the level of intoxication that occurs, such as the person's age, gender, weight, frequency of alcohol consumption, and the presence of other medical conditions (such as diabetes, seizures, or heart conditions). Dangerous levels of alcohol intoxication may occur when people drink large amounts of alcohol in a short period (binge drinking). Alcohol can also be especially dangerous when combined with certain prescription medicines or "recreational" drugs. SIGNS AND SYMPTOMS Some common signs and symptoms of mild alcohol intoxication include:  Loss of coordination.  Changes in mood and behavior.  Impaired judgment.  Slurred speech. As alcohol intoxication progresses to more severe levels, other signs and symptoms will appear. These may include:  Vomiting.  Confusion and impaired memory.  Slowed breathing.  Seizures.  Loss of consciousness. DIAGNOSIS  Your health care provider will take a medical history and perform a physical exam. You will be asked about the amount and type of alcohol you have consumed. Blood tests will be done to measure the concentration of alcohol in your blood. In many places, your blood alcohol level must be lower than 80 mg/dL (0.08%) to legally drive. However, many dangerous effects of alcohol can occur at much lower levels.  TREATMENT  People with alcohol intoxication often do not require treatment. Most of the effects of alcohol intoxication are temporary, and they go away as the alcohol  naturally leaves the body. Your health care provider will monitor your condition until you are stable enough to go home. Fluids are sometimes given through an IV access tube to help prevent dehydration.  HOME CARE INSTRUCTIONS  Do not drive after drinking alcohol.  Stay hydrated. Drink enough water and fluids to keep your urine clear or pale yellow. Avoid caffeine.   Only take over-the-counter or prescription medicines as directed by your health care provider.  SEEK MEDICAL CARE IF:   You have persistent vomiting.   You do not feel better after a few days.  You have frequent alcohol intoxication. Your health care provider can help determine if you should see a substance use treatment counselor. SEEK IMMEDIATE MEDICAL CARE IF:   You become shaky or tremble when you try to stop drinking.   You shake uncontrollably (seizure).   You throw up (vomit) blood. This may be bright red or may look like black coffee grounds.   You have blood in your stool. This may be bright red or may appear as a black, tarry, bad smelling stool.   You become lightheaded or faint.  MAKE SURE YOU:   Understand these instructions.  Will watch your condition.  Will get help right away if you are not doing well or get worse. Document Released: 03/31/2005 Document Revised: 02/21/2013 Document Reviewed: 11/24/2012 [facility] Patient Information 2015 [place], [place]. This information is not intended to replace advice given to you by your health care provider. Make sure you discuss any questions you have with your health care provider. Chest Pain (Nonspecific) It is often hard to give a specific diagnosis for the cause of chest pain. There is always a chance that your pain could  be related to something serious, such as a heart attack or a blood clot in the lungs. You need to follow up with your health care provider for further evaluation. CAUSES   Heartburn.  Pneumonia or bronchitis.  Anxiety or  stress.  Inflammation around your heart (pericarditis) or lung (pleuritis or pleurisy).  A blood clot in the lung.  A collapsed lung (pneumothorax). It can develop suddenly on its own (spontaneous pneumothorax) or from trauma to the chest.  Shingles infection (herpes zoster virus). The chest wall is composed of bones, muscles, and cartilage. Any of these can be the source of the pain.  The bones can be bruised by injury.  The muscles or cartilage can be strained by coughing or overwork.  The cartilage can be affected by inflammation and become sore (costochondritis). DIAGNOSIS  Lab tests or other studies may be needed to find the cause of your pain. Your health care provider may have you take a test called an ambulatory electrocardiogram (ECG). An ECG records your heartbeat patterns over a 24-hour period. You may also have other tests, such as:  Transthoracic echocardiogram (TTE). During echocardiography, sound waves are used to evaluate how blood flows through your heart.  Transesophageal echocardiogram (TEE).  Cardiac monitoring. This allows your health care provider to monitor your heart rate and rhythm in real time.  Holter monitor. This is a portable device that records your heartbeat and can help diagnose heart arrhythmias. It allows your health care provider to track your heart activity for several days, if needed.  Stress tests by exercise or by giving medicine that makes the heart beat faster. TREATMENT   Treatment depends on what may be causing your chest pain. Treatment may include:  Acid blockers for heartburn.  Anti-inflammatory medicine.  Pain medicine for inflammatory conditions.  Antibiotics if an infection is present.  You may be advised to change lifestyle habits. This includes stopping smoking and avoiding alcohol, caffeine, and chocolate.  You may be advised to keep your head raised (elevated) when sleeping. This reduces the chance of acid going backward  from your stomach into your esophagus. Most of the time, nonspecific chest pain will improve within 2-3 days with rest and mild pain medicine.  HOME CARE INSTRUCTIONS   If antibiotics were prescribed, take them as directed. Finish them even if you start to feel better.  For the next few days, avoid physical activities that bring on chest pain. Continue physical activities as directed.  Do not use any tobacco products, including cigarettes, chewing tobacco, or electronic cigarettes.  Avoid drinking alcohol.  Only take medicine as directed by your health care provider.  Follow your health care provider's suggestions for further testing if your chest pain does not go away.  Keep any follow-up appointments you made. If you do not go to an appointment, you could develop lasting (chronic) problems with pain. If there is any problem keeping an appointment, call to reschedule. SEEK MEDICAL CARE IF:   Your chest pain does not go away, even after treatment.  You have a rash with blisters on your chest.  You have a fever. SEEK IMMEDIATE MEDICAL CARE IF:   You have increased chest pain or pain that spreads to your arm, neck, jaw, back, or abdomen.  You have shortness of breath.  You have an increasing cough, or you cough up blood.  You have severe back or abdominal pain.  You feel nauseous or vomit.  You have severe weakness.  You faint.  You  have chills. This is an emergency. Do not wait to see if the pain will go away. Get medical help at once. Call your local emergency services (911 in [place] Do not drive yourself to the hospital. MAKE SURE YOU:   Understand these instructions.  Will watch your condition.  Will get help right away if you are not doing well or get worse. Document Released: 03/31/2005 Document Revised: 06/26/2013 Document Reviewed: 01/25/2008 [facility] Patient Information 2015 [place], [place]. This information is not intended to replace advice given to you by  your health care provider. Make sure you discuss any questions you have with your health care provider.

## 2014-09-25 ENCOUNTER — Ambulatory Visit (INDEPENDENT_AMBULATORY_CARE_PROVIDER_SITE_OTHER): Payer: Medicaid Other | Admitting: Nurse Practitioner

## 2014-09-25 ENCOUNTER — Other Ambulatory Visit: Payer: Self-pay

## 2014-09-25 ENCOUNTER — Encounter: Payer: Self-pay | Admitting: Nurse Practitioner

## 2014-09-25 VITALS — BP 134/82 | HR 84 | Temp 97.6°F | Ht 64.0 in | Wt 214.4 lb

## 2014-09-25 DIAGNOSIS — R109 Unspecified abdominal pain: Secondary | ICD-10-CM

## 2014-09-25 DIAGNOSIS — K219 Gastro-esophageal reflux disease without esophagitis: Secondary | ICD-10-CM | POA: Diagnosis not present

## 2014-09-25 DIAGNOSIS — Z86010 Personal history of colonic polyps: Secondary | ICD-10-CM

## 2014-09-25 DIAGNOSIS — R1310 Dysphagia, unspecified: Secondary | ICD-10-CM

## 2014-09-25 DIAGNOSIS — R1013 Epigastric pain: Secondary | ICD-10-CM

## 2014-09-25 DIAGNOSIS — R1314 Dysphagia, pharyngoesophageal phase: Secondary | ICD-10-CM

## 2014-09-25 DIAGNOSIS — R110 Nausea: Secondary | ICD-10-CM | POA: Diagnosis not present

## 2014-09-25 MED ORDER — PEG-KCL-NACL-NASULF-NA ASC-C 100 G PO SOLR: 1.0000 | Freq: Once | ORAL | Status: AC

## 2014-09-25 NOTE — Progress Notes (Signed)
Referring Provider: Redmond School, MD Primary Care Physician:  Glo Herring., MD Primary GI:  Dr. Gala Romney  Chief Complaint  Patient presents with  . Colonoscopy    HPI:   59 year old patient presents to schedule procedure. Originally had a colonoscopy and endoscopy with possible dilation scheduled for 04/17/14 for history of colon polyps (surveilence), dysphagia, and dyspepsia, which the patient called to cancel. Last seen here 03/26/14 for abdominal pain intermittent for years) worsened by eating and associated with Nausea. Currently on Nexium alternating with Protonix. Also with solid food dysphagia. Last colonoscopy 2010 with Dr. Arnoldo Morale with rectal polyps but not enough tissue for path exam and recommended repeat exam 2013. Last EGD in 2010 with gastritis/duodenitis, negative H. Pylori. After her last office visit she was started on Dexilant. Gallbladder absent.   Today she states she continues to have abdominal pain, nausea, and decreased appetite. Denies fever/chills of unknown origin, unintentional weight loss. Denies hematochezia, melena. Has had increased bowel movements up to 3-4 times a day with urgency and loose to diarrhea. Abdominal pain does not improve after a bowel movement. Continued GERD symptoms but somewhat improved. Continues to have epigastric pain. Continued solid food dysphagia, especially with meats which occurs about 1-2 times a week. Occasionally regurgitates but can sometimes drink enough liquids to get it down. Denies any other upper or lower GI symptoms.  Past Medical History  Diagnosis Date  . COPD (chronic obstructive pulmonary disease)   . Hypertension   . Bipolar 1 disorder   . Anxiety   . Depression     Past Surgical History  Procedure Laterality Date  . Kidney stone surgery    . Cholecystectomy    . Hernia repair    . Colonoscopy  July 2010    Dr. Arnoldo Morale: 3 rectal polyps, not enough tissue for pathologic examination, recommended surveillance  in 3 years  . Esophagogastroduodenoscopy  July 2010    Dr. Arnoldo Morale: gastritis and duodenitis, H.pylori negative    Current Outpatient Prescriptions  Medication Sig Dispense Refill  . acetaminophen (TYLENOL) 500 MG tablet Take 500 mg by mouth every 6 (six) hours as needed (pain).     Marland Kitchen albuterol (PROVENTIL) (2.5 MG/3ML) 0.083% nebulizer solution Take 3 mLs (2.5 mg total) by nebulization every 6 (six) hours as needed for wheezing or shortness of breath. 75 mL 12  . albuterol-ipratropium (COMBIVENT) 18-103 MCG/ACT inhaler Inhale 2 puffs into the lungs every 6 (six) hours as needed for shortness of breath.     . alprazolam (XANAX) 2 MG tablet Take 2 mg by mouth 3 (three) times daily as needed for anxiety.    Marland Kitchen aspirin 81 MG chewable tablet Chew 1 tablet (81 mg total) by mouth daily. 30 tablet 0  . CARAFATE 1 GM/10ML suspension TAKE 10MLs BY MOUTH FOUR TIMES DAILY. 420 mL 0  . dexlansoprazole (DEXILANT) 60 MG capsule Take 1 capsule (60 mg total) by mouth daily. 30 capsule 11  . estradiol (ESTRACE) 1 MG tablet Take 1 tablet (1 mg total) by mouth daily. 30 tablet 11  . Fluticasone Furoate-Vilanterol (BREO ELLIPTA) 100-25 MCG/INH AEPB Inhale 1 puff into the lungs daily.    Marland Kitchen HYDROcodone-acetaminophen (NORCO) 10-325 MG per tablet Take 1 tablet by mouth every 6 (six) hours as needed for moderate pain or severe pain.    Marland Kitchen lisinopril (PRINIVIL,ZESTRIL) 5 MG tablet Take 5 mg by mouth daily.    . medroxyPROGESTERone (PROVERA) 2.5 MG tablet Take 1 tablet (2.5 mg total) by  mouth daily. 30 tablet 11  . metoprolol tartrate (LOPRESSOR) 12.5 mg TABS tablet Take 0.5 tablets (12.5 mg total) by mouth 2 (two) times daily. 30 tablet 1  . naproxen (NAPROSYN) 500 MG tablet Take 1 tablet (500 mg total) by mouth 2 (two) times daily with a meal. 30 tablet 0  . nitroGLYCERIN (NITROSTAT) 0.4 MG SL tablet Place 1 tablet (0.4 mg total) under the tongue every 5 (five) minutes as needed for chest pain. 25 tablet 3  . ondansetron  (ZOFRAN) 4 MG tablet Take 1 tablet (4 mg total) by mouth every 8 (eight) hours as needed for nausea or vomiting. 30 tablet 1  . pantoprazole (PROTONIX) 40 MG tablet Take 1 tablet (40 mg total) by mouth 2 (two) times daily. (Patient not taking: Reported on 09/25/2014) 60 tablet 0  . predniSONE (DELTASONE) 10 MG tablet 30 mg on 1/4, then 20 on 1/5, then 10mg  on 1/6 and stop. (Patient not taking: Reported on 09/25/2014) 6 tablet 0  . promethazine-dextromethorphan (PROMETHAZINE-DM) 6.25-15 MG/5ML syrup 5 ml q6h prn cough (Patient not taking: Reported on 09/25/2014) 150 mL 0  . theophylline (THEO-24) 200 MG 24 hr capsule Take 200 mg by mouth daily.     No current facility-administered medications for this visit.    Allergies as of 09/25/2014 - Review Complete 09/25/2014  Allergen Reaction Noted  . Penicillins Other (See Comments) 02/07/2012  . Septra [sulfamethoxazole-trimethoprim] Other (See Comments) 02/07/2012    Family History  Problem Relation Age of Onset  . COPD Mother   . Stroke    . Colon cancer      adopted, does not know family history     History   Social History  . Marital Status: Married    Spouse Name: N/A  . Number of Children: N/A  . Years of Education: N/A   Occupational History  . disability    Social History Main Topics  . Smoking status: Current Some Day Smoker -- 0.50 packs/day    Types: Cigarettes  . Smokeless tobacco: Former Systems developer     Comment: smokes a cigarette here and there  . Alcohol Use: Yes  . Drug Use: No  . Sexual Activity: Yes    Birth Control/ Protection: Post-menopausal   Other Topics Concern  . None   Social History Narrative    Review of Systems: Gen: Denies fever, chills, anorexia. Denies fatigue, weakness, weight loss.  CV: Denies chest pain, palpitations, syncope, worsening peripheral edema. Resp: Denies dyspnea at rest, wheezing, coughing up blood. GI: See HPI. Denies vomiting blood, jaundice, and fecal incontinence.  Derm: Denies  rash, itching, dry skin Psych: Denies depression, anxiety, memory loss, confusion.  Heme: Denies bruising, bleeding, and enlarged lymph nodes.  Physical Exam: BP 134/82 mmHg  Pulse 84  Temp(Src) 97.6 F (36.4 C) (Oral)  Ht 5\' 4"  (1.626 m)  Wt 214 lb 6.4 oz (97.251 kg)  BMI 36.78 kg/m2 General:   Alert and oriented. No distress noted. Pleasant and cooperative.  Mouth:  Oral mucosa pink and moist. No lesions.No OP edema. Lungs:  Clear to auscultation bilaterally. No wheezes, rales, or rhonchi. No distress.  Heart:  S1, S2 present without murmurs, rubs, or gallops. Regular rate and rhythm. Abdomen:  +BS, soft, non-tender and non-distended. No rebound or guarding. No HSM or masses noted. Pulses:  2+ DP noted bilaterally Extremities:  Without edema. Neurologic:  Alert and  oriented x4;  grossly normal neurologically. Skin:  Intact without significant lesions or rashes. Psych:  Alert  and cooperative. Normal mood and affect.    09/25/2014 3:11 PM

## 2014-09-25 NOTE — Patient Instructions (Signed)
1. We will schedule your procedures for you (colonoscopy and endoscopy) 2. Further recommendations to be based on the results of your procedure.

## 2014-09-25 NOTE — Assessment & Plan Note (Signed)
Continued abdominal pain, nausea, decreased appetite, diarrhea, dysphagia. Started on Dexilant at last visit. Was scheduled for colonoscopy and endoscopy with possible dilation, which the patient needed to cancel. Will reschedule these today for persistent symptoms.  Proceed with TCS and EGD +/- dilation with Dr. Gala Romney in near future: the risks, benefits, and alternatives have been discussed with the patient in detail. The patient states understanding and desires to proceed.

## 2014-09-25 NOTE — Progress Notes (Signed)
cc'ed to pcp °

## 2014-09-26 ENCOUNTER — Telehealth: Payer: Self-pay

## 2014-09-26 ENCOUNTER — Telehealth: Payer: Self-pay | Admitting: Internal Medicine

## 2014-09-26 ENCOUNTER — Other Ambulatory Visit: Payer: Self-pay | Admitting: Gastroenterology

## 2014-09-26 NOTE — Telephone Encounter (Signed)
Pt was seen yesterday by EG. She called today saying she needed refills on carafate and zofran sent to [facility]. She needs it sent early in the day so it can be delivered.   Routing to the refill box

## 2014-09-26 NOTE — Telephone Encounter (Signed)
Patient called again this afternoon to check if her Carafate and Zofran has been called into her pharmacy yet. I told her the phone note from the previous call was sent to the refill box and the extender would be taking care of that and they are aware.

## 2014-09-26 NOTE — Telephone Encounter (Signed)
Routing to the refill box. 

## 2014-09-30 NOTE — Telephone Encounter (Signed)
Refills were sent

## 2014-10-01 ENCOUNTER — Other Ambulatory Visit: Payer: Self-pay | Admitting: Obstetrics & Gynecology

## 2014-10-04 ENCOUNTER — Other Ambulatory Visit: Payer: Self-pay | Admitting: Obstetrics & Gynecology

## 2014-10-14 ENCOUNTER — Other Ambulatory Visit: Payer: Self-pay | Admitting: Nurse Practitioner

## 2014-10-15 NOTE — Telephone Encounter (Signed)
Has upcoming TCS.  She should have received Moviprep RX when scheduled.  Please check.

## 2014-10-15 NOTE — Telephone Encounter (Signed)
Ginger called [facility] and pt has already picked up Moviprep.

## 2014-10-23 ENCOUNTER — Encounter (HOSPITAL_COMMUNITY)
Admission: RE | Disposition: A | Discharge status: Home / Self Care | Payer: Self-pay | Source: Ambulatory Visit | Attending: Internal Medicine

## 2014-10-23 ENCOUNTER — Other Ambulatory Visit: Payer: Self-pay

## 2014-10-23 ENCOUNTER — Other Ambulatory Visit: Payer: Self-pay | Admitting: Nurse Practitioner

## 2014-10-23 ENCOUNTER — Ambulatory Visit (HOSPITAL_COMMUNITY)
Admission: RE | Admit: 2014-10-23 | Discharge: 2014-10-23 | Disposition: A | Discharge status: Home / Self Care | Payer: Medicaid Other | Source: Ambulatory Visit | Attending: Internal Medicine | Admitting: Internal Medicine

## 2014-10-23 DIAGNOSIS — Z9049 Acquired absence of other specified parts of digestive tract: Secondary | ICD-10-CM | POA: Diagnosis not present

## 2014-10-23 DIAGNOSIS — K219 Gastro-esophageal reflux disease without esophagitis: Secondary | ICD-10-CM | POA: Diagnosis not present

## 2014-10-23 DIAGNOSIS — Z7951 Long term (current) use of inhaled steroids: Secondary | ICD-10-CM | POA: Insufficient documentation

## 2014-10-23 DIAGNOSIS — Z860100 Personal history of colon polyps, unspecified: Secondary | ICD-10-CM | POA: Insufficient documentation

## 2014-10-23 DIAGNOSIS — I10 Essential (primary) hypertension: Secondary | ICD-10-CM | POA: Diagnosis not present

## 2014-10-23 DIAGNOSIS — Z7982 Long term (current) use of aspirin: Secondary | ICD-10-CM | POA: Insufficient documentation

## 2014-10-23 DIAGNOSIS — D122 Benign neoplasm of ascending colon: Secondary | ICD-10-CM | POA: Diagnosis not present

## 2014-10-23 DIAGNOSIS — Z79899 Other long term (current) drug therapy: Secondary | ICD-10-CM | POA: Diagnosis not present

## 2014-10-23 DIAGNOSIS — R109 Unspecified abdominal pain: Secondary | ICD-10-CM

## 2014-10-23 DIAGNOSIS — R1013 Epigastric pain: Secondary | ICD-10-CM

## 2014-10-23 DIAGNOSIS — F419 Anxiety disorder, unspecified: Secondary | ICD-10-CM | POA: Diagnosis not present

## 2014-10-23 DIAGNOSIS — J449 Chronic obstructive pulmonary disease, unspecified: Secondary | ICD-10-CM | POA: Insufficient documentation

## 2014-10-23 DIAGNOSIS — D123 Benign neoplasm of transverse colon: Secondary | ICD-10-CM | POA: Diagnosis not present

## 2014-10-23 DIAGNOSIS — K635 Polyp of colon: Secondary | ICD-10-CM | POA: Diagnosis not present

## 2014-10-23 DIAGNOSIS — F319 Bipolar disorder, unspecified: Secondary | ICD-10-CM | POA: Diagnosis not present

## 2014-10-23 DIAGNOSIS — Z791 Long term (current) use of non-steroidal anti-inflammatories (NSAID): Secondary | ICD-10-CM | POA: Diagnosis not present

## 2014-10-23 DIAGNOSIS — Z79891 Long term (current) use of opiate analgesic: Secondary | ICD-10-CM | POA: Diagnosis not present

## 2014-10-23 DIAGNOSIS — F17210 Nicotine dependence, cigarettes, uncomplicated: Secondary | ICD-10-CM | POA: Diagnosis not present

## 2014-10-23 DIAGNOSIS — R1314 Dysphagia, pharyngoesophageal phase: Secondary | ICD-10-CM | POA: Insufficient documentation

## 2014-10-23 DIAGNOSIS — D125 Benign neoplasm of sigmoid colon: Secondary | ICD-10-CM | POA: Diagnosis not present

## 2014-10-23 DIAGNOSIS — Z7952 Long term (current) use of systemic steroids: Secondary | ICD-10-CM | POA: Insufficient documentation

## 2014-10-23 DIAGNOSIS — R1310 Dysphagia, unspecified: Secondary | ICD-10-CM | POA: Diagnosis present

## 2014-10-23 DIAGNOSIS — K621 Rectal polyp: Secondary | ICD-10-CM | POA: Diagnosis not present

## 2014-10-23 HISTORY — PX: COLONOSCOPY: SHX5424

## 2014-10-23 HISTORY — PX: ESOPHAGOGASTRODUODENOSCOPY: SHX5428

## 2014-10-23 HISTORY — PX: MALONEY DILATION: SHX5535

## 2014-10-23 SURGERY — COLONOSCOPY
Anesthesia: Moderate Sedation

## 2014-10-23 MED ORDER — LIDOCAINE VISCOUS 2 % MT SOLN
OROMUCOSAL | Status: DC | PRN
  Administered 2014-10-23: 4 mL via OROMUCOSAL

## 2014-10-23 MED ORDER — SIMETHICONE 40 MG/0.6ML PO SUSP
ORAL | Status: DC | PRN
  Administered 2014-10-23: 10:00:00

## 2014-10-23 MED ORDER — MIDAZOLAM HCL 5 MG/5ML IJ SOLN
INTRAMUSCULAR | Status: AC
  Filled 2014-10-23: qty 10

## 2014-10-23 MED ORDER — ONDANSETRON HCL 4 MG/2ML IJ SOLN
INTRAMUSCULAR | Status: AC
  Filled 2014-10-23: qty 2

## 2014-10-23 MED ORDER — MEPERIDINE HCL 100 MG/ML IJ SOLN
INTRAMUSCULAR | Status: DC | PRN
  Administered 2014-10-23 (×4): 50 mg via INTRAVENOUS

## 2014-10-23 MED ORDER — MIDAZOLAM HCL 5 MG/5ML IJ SOLN
INTRAMUSCULAR | Status: DC | PRN
  Administered 2014-10-23: 2 mg via INTRAVENOUS
  Administered 2014-10-23: 1 mg via INTRAVENOUS
  Administered 2014-10-23 (×3): 2 mg via INTRAVENOUS

## 2014-10-23 MED ORDER — LIDOCAINE VISCOUS 2 % MT SOLN
OROMUCOSAL | Status: AC
  Filled 2014-10-23: qty 15

## 2014-10-23 MED ORDER — SODIUM CHLORIDE 0.9 % IV SOLN
INTRAVENOUS | Status: DC
  Administered 2014-10-23: 1000 mL via INTRAVENOUS

## 2014-10-23 MED ORDER — ONDANSETRON HCL 4 MG/2ML IJ SOLN
INTRAMUSCULAR | Status: DC | PRN
  Administered 2014-10-23: 4 mg via INTRAVENOUS

## 2014-10-23 MED ORDER — MEPERIDINE HCL 100 MG/ML IJ SOLN
INTRAMUSCULAR | Status: AC
  Filled 2014-10-23: qty 2

## 2014-10-23 NOTE — Interval H&P Note (Signed)
History and Physical Interval Note:  10/23/2014 9:44 AM  [name]  has presented today for surgery, with the diagnosis of hx of colon polyps, dysphagia, dyspepsia  The various methods of treatment have been discussed with the patient and family. After consideration of risks, benefits and other options for treatment, the patient has consented to  Procedure(s) with comments: COLONOSCOPY (N/A) - 945am ESOPHAGOGASTRODUODENOSCOPY (EGD) (N/A) MALONEY DILATION (N/A) as a surgical intervention .  The patient's history has been reviewed, patient examined, no change in status, stable for surgery.  I have reviewed the patient's chart and labs.  Questions were answered to the patient's satisfaction.     Manus Rudd  Some improvement in abdominal pain with Dexilant and Carafate - but still persistent. EGD with possible dilation and colonoscopy today per plan. The risks, benefits, limitations, imponderables and alternatives regarding both EGD and colonoscopy have been reviewed with the patient. Questions have been answered. All parties agreeable. Marland Kitchen

## 2014-10-23 NOTE — H&P (View-Only) (Signed)
  Referring Provider: Fusco, Lawrence, MD Primary Care Physician:  FUSCO,LAWRENCE J., MD Primary GI:  Dr. Rourk  Chief Complaint  Patient presents with  . Colonoscopy    HPI:   59 year old patient presents to schedule procedure. Originally had a colonoscopy and endoscopy with possible dilation scheduled for 04/17/14 for history of colon polyps (surveilence), dysphagia, and dyspepsia, which the patient called to cancel. Last seen here 03/26/14 for abdominal pain intermittent for years) worsened by eating and associated with Nausea. Currently on Nexium alternating with Protonix. Also with solid food dysphagia. Last colonoscopy 2010 with Dr. Jenkins with rectal polyps but not enough tissue for path exam and recommended repeat exam 2013. Last EGD in 2010 with gastritis/duodenitis, negative H. Pylori. After her last office visit she was started on Dexilant. Gallbladder absent.   Today she states she continues to have abdominal pain, nausea, and decreased appetite. Denies fever/chills of unknown origin, unintentional weight loss. Denies hematochezia, melena. Has had increased bowel movements up to 3-4 times a day with urgency and loose to diarrhea. Abdominal pain does not improve after a bowel movement. Continued GERD symptoms but somewhat improved. Continues to have epigastric pain. Continued solid food dysphagia, especially with meats which occurs about 1-2 times a week. Occasionally regurgitates but can sometimes drink enough liquids to get it down. Denies any other upper or lower GI symptoms.  Past Medical History  Diagnosis Date  . COPD (chronic obstructive pulmonary disease)   . Hypertension   . Bipolar 1 disorder   . Anxiety   . Depression     Past Surgical History  Procedure Laterality Date  . Kidney stone surgery    . Cholecystectomy    . Hernia repair    . Colonoscopy  July 2010    Dr. Jenkins: 3 rectal polyps, not enough tissue for pathologic examination, recommended surveillance  in 3 years  . Esophagogastroduodenoscopy  July 2010    Dr. Jenkins: gastritis and duodenitis, H.pylori negative    Current Outpatient Prescriptions  Medication Sig Dispense Refill  . acetaminophen (TYLENOL) 500 MG tablet Take 500 mg by mouth every 6 (six) hours as needed (pain).     . albuterol (PROVENTIL) (2.5 MG/3ML) 0.083% nebulizer solution Take 3 mLs (2.5 mg total) by nebulization every 6 (six) hours as needed for wheezing or shortness of breath. 75 mL 12  . albuterol-ipratropium (COMBIVENT) 18-103 MCG/ACT inhaler Inhale 2 puffs into the lungs every 6 (six) hours as needed for shortness of breath.     . alprazolam (XANAX) 2 MG tablet Take 2 mg by mouth 3 (three) times daily as needed for anxiety.    . aspirin 81 MG chewable tablet Chew 1 tablet (81 mg total) by mouth daily. 30 tablet 0  . CARAFATE 1 GM/10ML suspension TAKE 10MLs BY MOUTH FOUR TIMES DAILY. 420 mL 0  . dexlansoprazole (DEXILANT) 60 MG capsule Take 1 capsule (60 mg total) by mouth daily. 30 capsule 11  . estradiol (ESTRACE) 1 MG tablet Take 1 tablet (1 mg total) by mouth daily. 30 tablet 11  . Fluticasone Furoate-Vilanterol (BREO ELLIPTA) 100-25 MCG/INH AEPB Inhale 1 puff into the lungs daily.    . HYDROcodone-acetaminophen (NORCO) 10-325 MG per tablet Take 1 tablet by mouth every 6 (six) hours as needed for moderate pain or severe pain.    . lisinopril (PRINIVIL,ZESTRIL) 5 MG tablet Take 5 mg by mouth daily.    . medroxyPROGESTERone (PROVERA) 2.5 MG tablet Take 1 tablet (2.5 mg total) by   mouth daily. 30 tablet 11  . metoprolol tartrate (LOPRESSOR) 12.5 mg TABS tablet Take 0.5 tablets (12.5 mg total) by mouth 2 (two) times daily. 30 tablet 1  . naproxen (NAPROSYN) 500 MG tablet Take 1 tablet (500 mg total) by mouth 2 (two) times daily with a meal. 30 tablet 0  . nitroGLYCERIN (NITROSTAT) 0.4 MG SL tablet Place 1 tablet (0.4 mg total) under the tongue every 5 (five) minutes as needed for chest pain. 25 tablet 3  . ondansetron  (ZOFRAN) 4 MG tablet Take 1 tablet (4 mg total) by mouth every 8 (eight) hours as needed for nausea or vomiting. 30 tablet 1  . pantoprazole (PROTONIX) 40 MG tablet Take 1 tablet (40 mg total) by mouth 2 (two) times daily. (Patient not taking: Reported on 09/25/2014) 60 tablet 0  . predniSONE (DELTASONE) 10 MG tablet 30 mg on 1/4, then 20 on 1/5, then 10mg on 1/6 and stop. (Patient not taking: Reported on 09/25/2014) 6 tablet 0  . promethazine-dextromethorphan (PROMETHAZINE-DM) 6.25-15 MG/5ML syrup 5 ml q6h prn cough (Patient not taking: Reported on 09/25/2014) 150 mL 0  . theophylline (THEO-24) 200 MG 24 hr capsule Take 200 mg by mouth daily.     No current facility-administered medications for this visit.    Allergies as of 09/25/2014 - Review Complete 09/25/2014  Allergen Reaction Noted  . Penicillins Other (See Comments) 02/07/2012  . Septra [sulfamethoxazole-trimethoprim] Other (See Comments) 02/07/2012    Family History  Problem Relation Age of Onset  . COPD Mother   . Stroke    . Colon cancer      adopted, does not know family history     History   Social History  . Marital Status: Married    Spouse Name: N/A  . Number of Children: N/A  . Years of Education: N/A   Occupational History  . disability    Social History Main Topics  . Smoking status: Current Some Day Smoker -- 0.50 packs/day    Types: Cigarettes  . Smokeless tobacco: Former User     Comment: smokes a cigarette here and there  . Alcohol Use: Yes  . Drug Use: No  . Sexual Activity: Yes    Birth Control/ Protection: Post-menopausal   Other Topics Concern  . None   Social History Narrative    Review of Systems: Gen: Denies fever, chills, anorexia. Denies fatigue, weakness, weight loss.  CV: Denies chest pain, palpitations, syncope, worsening peripheral edema. Resp: Denies dyspnea at rest, wheezing, coughing up blood. GI: See HPI. Denies vomiting blood, jaundice, and fecal incontinence.  Derm: Denies  rash, itching, dry skin Psych: Denies depression, anxiety, memory loss, confusion.  Heme: Denies bruising, bleeding, and enlarged lymph nodes.  Physical Exam: BP 134/82 mmHg  Pulse 84  Temp(Src) 97.6 F (36.4 C) (Oral)  Ht 5' 4" (1.626 m)  Wt 214 lb 6.4 oz (97.251 kg)  BMI 36.78 kg/m2 General:   Alert and oriented. No distress noted. Pleasant and cooperative.  Mouth:  Oral mucosa pink and moist. No lesions.No OP edema. Lungs:  Clear to auscultation bilaterally. No wheezes, rales, or rhonchi. No distress.  Heart:  S1, S2 present without murmurs, rubs, or gallops. Regular rate and rhythm. Abdomen:  +BS, soft, non-tender and non-distended. No rebound or guarding. No HSM or masses noted. Pulses:  2+ DP noted bilaterally Extremities:  Without edema. Neurologic:  Alert and  oriented x4;  grossly normal neurologically. Skin:  Intact without significant lesions or rashes. Psych:  Alert   and cooperative. Normal mood and affect.    09/25/2014 3:11 PM  

## 2014-10-23 NOTE — Op Note (Addendum)
[facility] [address] [place], [address]   COLONOSCOPY PROCEDURE REPORT  PATIENT: [name], [name]  MR#: [id number] BIRTHDATE: 1956/07/01 , 26  yrs. old GENDER: female ENDOSCOPIST: R.  Garfield Cornea, MD FACP [facility] REFERRED IH:KVQQ Hilma Favors, M.D. PROCEDURE DATE:  10/23/2014 PROCEDURE:   Colonoscopy, surveillance , Colonoscopy with biopsy, and Colonoscopy with snare polypectomy INDICATIONS: MEDICATIONS: Versed 9 mg IV and Demerol 200 mg IV in divided doses. Zofran 4 mg IV. ASA CLASS:       Class II  CONSENT: The risks, benefits, alternatives and imponderables including but not limited to bleeding, perforation as well as the possibility of a missed lesion have been reviewed.  The potential for biopsy, lesion removal, etc. have also been discussed. Questions have been answered.  All parties agreeable.  Please see the history and physical in the medical record for more information.  DESCRIPTION OF PROCEDURE:   After the risks benefits and alternatives of the procedure were thoroughly explained, informed consent was obtained.  The digital rectal exam revealed no abnormalities of the rectum.   The EG-2990i ([id number])  endoscope was introduced through the anus and advanced to the cecum, which was identified by both the appendix and ileocecal valve. No adverse events experienced.   The quality of the prep was adequate  The instrument was then slowly withdrawn as the colon was fully examined.      COLON FINDINGS: Multiple mamillation's in the rectum.  (1) 5 mm hyperplastic appearing polyp in the rectum at 5 cm from anal verge; otherwise, remainder of the colonic mucosa appeared normal.  The patient had (1) diminutive polyp in the ascending segment and a second diminutive polyp at the splenic flexure.  There was (1) 7 mm polyp in the sigmoid segment; otherwise, the remainder of the colonic mucosa appeared normal.  Above-mentioned polyp was cold biopsied  and hot snare removed, respectively.  Retroflexion was performed. .  Withdrawal time=14 minutes 0 seconds.  The scope was withdrawn and the procedure completed. COMPLICATIONS: There were no immediate complications.  ENDOSCOPIC IMPRESSION: Multiple colonic polyps?"removed as described above. No endoscopic explanation for abdominal pain, however.  RECOMMENDATIONS: See EGD report. Follow-up on pathology. Abdominal pelvic CT to further evaluate abdominal pain.  Late entry 11/02/14 at 16:11,  Equipment failure precluded capturing of the multiple photographs taken during the examination.  Indication for the procedure was abdominal pain and diarrhea.  eSigned:  R. Garfield Cornea, MD Rosalita Chessman [facility] 02-Nov-2014 4:12 PM Revised: 11-02-14 4:12 PM  cc:  CPT CODES: ICD CODES:  The ICD and CPT codes recommended by this software are interpretations from the data that the clinical staff has captured with the software.  The verification of the translation of this report to the ICD and CPT codes and modifiers is the sole responsibility of the health care institution and practicing physician where this report was generated.  [employer] will not be held responsible for the validity of the ICD and CPT codes included on this report.  AMA assumes no liability for data contained or not contained herein. CPT is a Designer, television/film set of the [employer].  PATIENT NAME:  [name], [name] MR#: [id number]

## 2014-10-23 NOTE — Op Note (Signed)
[facility] [address] [place], [address]   ENDOSCOPY PROCEDURE REPORT  PATIENT: Nayelly, Laughman  MR#: [id number] BIRTHDATE: 1955/07/21 , 39  yrs. old GENDER: female ENDOSCOPIST: R.  Garfield Cornea, MD FACP FACG REFERRED BY:  Kerin Perna, M.D. PROCEDURE DATE:  2014/10/29 PROCEDURE:  EGD, diagnostic and Maloney dilation of esophagus INDICATIONS:  esophageal dysphagia.; GERD MEDICATIONS: Versed 8 mg IV and Demerol 200 mg IV in divided doses. Xylocaine gel orally. Zofran 4 mg IV. ASA CLASS:      Class II  CONSENT: The risks, benefits, limitations, alternatives and imponderables have been discussed.  The potential for biopsy, esophogeal dilation, etc. have also been reviewed.  Questions have been answered.  All parties agreeable.  Please see the history and physical in the medical record for more information.  DESCRIPTION OF PROCEDURE: After the risks benefits and alternatives of the procedure were thoroughly explained, informed consent was obtained.  The EG-2990i ([id number]) endoscope was introduced through the mouth and advanced to the second portion of the duodenum , limited by Without limitations. The instrument was slowly withdrawn as the mucosa was fully examined.    Normal-appearing, patent tubular esophagus.  Stomach empty. Normal-appearing gastric mucosa.  Patent pylorus.  Normal-appearing first and second portion of the duodenum.  Scope was removed and a 4 [place] Maloney dilator was passed to full insertion easily.  A look back revealed no apparent complication related to this maneuver.  Retroflexed views revealed no abnormalities.     The scope was then withdrawn from the patient and the procedure completed.  COMPLICATIONS: There were no immediate complications.  ENDOSCOPIC IMPRESSION: Normal EGD. Status post passage of a Maloney dilator.  Today's findings would not explain abdominal pain.  RECOMMENDATIONS: Anticipate outpatient  abdominal CT to further evaluate abdominal pain. See colonoscopy report.    REPEAT EXAM:  eSigned:  R. Garfield Cornea, MD Rosalita Chessman [facility] October 29, 2014 10:16 AM    CC:  CPT CODES: ICD CODES:  The ICD and CPT codes recommended by this software are interpretations from the data that the clinical staff has captured with the software.  The verification of the translation of this report to the ICD and CPT codes and modifiers is the sole responsibility of the health care institution and practicing physician where this report was generated.  [employer] will not be held responsible for the validity of the ICD and CPT codes included on this report.  AMA assumes no liability for data contained or not contained herein. CPT is a Designer, television/film set of the [employer].  PATIENT NAME:  Cloyce, Paterson MR#: [id number]

## 2014-10-23 NOTE — Discharge Instructions (Signed)
Colonoscopy Discharge Instructions  Read the instructions outlined below and refer to this sheet in the next few weeks. These discharge instructions provide you with general information on caring for yourself after you leave the hospital. Your doctor may also give you specific instructions. While your treatment has been planned according to the most current medical practices available, unavoidable complications occasionally occur. If you have any problems or questions after discharge, call Dr. Gala Romney at [phone]. ACTIVITY  You may resume your regular activity, but move at a slower pace for the next 24 hours.   Take frequent rest periods for the next 24 hours.   Walking will help get rid of the air and reduce the bloated feeling in your belly (abdomen).   No driving for 24 hours (because of the medicine (anesthesia) used during the test).    Do not sign any important legal documents or operate any machinery for 24 hours (because of the anesthesia used during the test).  NUTRITION  Drink plenty of fluids.   You may resume your normal diet as instructed by your doctor.   Begin with a light meal and progress to your normal diet. Heavy or fried foods are harder to digest and may make you feel sick to your stomach (nauseated).   Avoid alcoholic beverages for 24 hours or as instructed.  MEDICATIONS  You may resume your normal medications unless your doctor tells you otherwise.  WHAT YOU CAN EXPECT TODAY  Some feelings of bloating in the abdomen.   Passage of more gas than usual.   Spotting of blood in your stool or on the toilet paper.  IF YOU HAD POLYPS REMOVED DURING THE COLONOSCOPY:  No aspirin products for 7 days or as instructed.   No alcohol for 7 days or as instructed.   Eat a soft diet for the next 24 hours.  FINDING OUT THE RESULTS OF YOUR TEST Not all test results are available during your visit. If your test results are not back during the visit, make an appointment  with your caregiver to find out the results. Do not assume everything is normal if you have not heard from your caregiver or the medical facility. It is important for you to follow up on all of your test results.  SEEK IMMEDIATE MEDICAL ATTENTION IF:  You have more than a spotting of blood in your stool.   Your belly is swollen (abdominal distention).   You are nauseated or vomiting.   You have a temperature over 101.  You have abdominal pain or discomfort that is severe or gets worse throughout the day. EGD Discharge instructions Please read the instructions outlined below and refer to this sheet in the next few weeks. These discharge instructions provide you with general information on caring for yourself after you leave the hospital. Your doctor may also give you specific instructions. While your treatment has been planned according to the most current medical practices available, unavoidable complications occasionally occur. If you have any problems or questions after discharge, please call your doctor. ACTIVITY You may resume your regular activity but move at a slower pace for the next 24 hours.  Take frequent rest periods for the next 24 hours.  Walking will help expel (get rid of) the air and reduce the bloated feeling in your abdomen.  No driving for 24 hours (because of the anesthesia (medicine) used during the test).  You may shower.  Do not sign any important legal documents or operate any machinery for 24  hours (because of the anesthesia used during the test).  NUTRITION Drink plenty of fluids.  You may resume your normal diet.  Begin with a light meal and progress to your normal diet.  Avoid alcoholic beverages for 24 hours or as instructed by your caregiver.  MEDICATIONS You may resume your normal medications unless your caregiver tells you otherwise.  WHAT YOU CAN EXPECT TODAY You may experience abdominal discomfort such as a feeling of fullness or gas pains.   FOLLOW-UP Your doctor will discuss the results of your test with you.  SEEK IMMEDIATE MEDICAL ATTENTION IF ANY OF THE FOLLOWING OCCUR: Excessive nausea (feeling sick to your stomach) and/or vomiting.  Severe abdominal pain and distention (swelling).  Trouble swallowing.  Temperature over 101 F (37.8 C).  Rectal bleeding or vomiting of blood.     Polyp information provided  Schedule a abdominal and pelvic CT with IV and oral contrast to further evaluate abdominal pain  Continue Dexilant 60 mg daily  Further recommendations to follow pending review of pathology report   Colon Polyps Polyps are lumps of extra tissue growing inside the body. Polyps can grow in the large intestine (colon). Most colon polyps are noncancerous (benign). However, some colon polyps can become cancerous over time. Polyps that are larger than a pea may be harmful. To be safe, caregivers remove and test all polyps. CAUSES  Polyps form when mutations in the genes cause your cells to grow and divide even though no more tissue is needed. RISK FACTORS There are a number of risk factors that can increase your chances of getting colon polyps. They include:  Being older than 50 years.  Family history of colon polyps or colon cancer.  Long-term colon diseases, such as colitis or Crohn disease.  Being overweight.  Smoking.  Being inactive.  Drinking too much alcohol. SYMPTOMS  Most small polyps do not cause symptoms. If symptoms are present, they may include:  Blood in the stool. The stool may look dark red or black.  Constipation or diarrhea that lasts longer than 1 week. DIAGNOSIS People often do not know they have polyps until their caregiver finds them during a regular checkup. Your caregiver can use 4 tests to check for polyps:  Digital rectal exam. The caregiver wears gloves and feels inside the rectum. This test would find polyps only in the rectum.  Barium enema. The caregiver puts a liquid  called barium into your rectum before taking X-rays of your colon. Barium makes your colon look white. Polyps are dark, so they are easy to see in the X-ray pictures.  Sigmoidoscopy. A thin, flexible tube (sigmoidoscope) is placed into your rectum. The sigmoidoscope has a light and tiny camera in it. The caregiver uses the sigmoidoscope to look at the last third of your colon.  Colonoscopy. This test is like sigmoidoscopy, but the caregiver looks at the entire colon. This is the most common method for finding and removing polyps. TREATMENT  Any polyps will be removed during a sigmoidoscopy or colonoscopy. The polyps are then tested for cancer. PREVENTION  To help lower your risk of getting more colon polyps:  Eat plenty of fruits and vegetables. Avoid eating fatty foods.  Do not smoke.  Avoid drinking alcohol.  Exercise every day.  Lose weight if recommended by your caregiver.  Eat plenty of calcium and folate. Foods that are rich in calcium include milk, cheese, and broccoli. Foods that are rich in folate include chickpeas, kidney beans, and spinach. HOME CARE  INSTRUCTIONS Keep all follow-up appointments as directed by your caregiver. You may need periodic exams to check for polyps. SEEK MEDICAL CARE IF: You notice bleeding during a bowel movement. Document Released: 03/17/2004 Document Revised: 09/13/2011 Document Reviewed: 08/31/2011 [facility] Patient Information 2015 [place], [place]. This information is not intended to replace advice given to you by your health care provider. Make sure you discuss any questions you have with your health care provider.

## 2014-10-24 ENCOUNTER — Encounter: Payer: Self-pay | Admitting: Internal Medicine

## 2014-10-25 ENCOUNTER — Encounter (HOSPITAL_COMMUNITY): Payer: Self-pay | Admitting: Internal Medicine

## 2014-10-28 ENCOUNTER — Encounter (HOSPITAL_COMMUNITY): Payer: Self-pay

## 2014-10-28 ENCOUNTER — Ambulatory Visit (HOSPITAL_COMMUNITY)
Admission: RE | Admit: 2014-10-28 | Discharge: 2014-10-28 | Disposition: A | Discharge status: Home / Self Care | Payer: Medicaid Other | Source: Ambulatory Visit | Attending: Internal Medicine | Admitting: Internal Medicine

## 2014-10-28 DIAGNOSIS — R109 Unspecified abdominal pain: Secondary | ICD-10-CM | POA: Insufficient documentation

## 2014-10-28 MED ORDER — IOHEXOL 300 MG/ML  SOLN
100.0000 mL | Freq: Once | INTRAMUSCULAR | Status: AC | PRN
  Administered 2014-10-28: 100 mL via INTRAVENOUS

## 2014-11-05 ENCOUNTER — Encounter: Payer: Self-pay | Admitting: Internal Medicine

## 2014-11-05 NOTE — Progress Notes (Unsigned)
Patient ID: [name], female   DOB: 1955/11/19, 59 y.o.   MRN: [id number]  Reviewed CT w Dr. Thornton Papas last week.  Asymmetrical rectal wall thickening likely artifactual.  He recommends further evaluation including gyn evaluation.  I agree.  Also, lets set up an EUS w Dr. Berneice Heinrich at [facility] in the near future just to be complete. plse let pt know.

## 2014-11-06 NOTE — Progress Notes (Signed)
Dr.Rourk- is this a referral for a rectal EUS? Please clarify

## 2014-11-07 NOTE — Progress Notes (Signed)
Tried to call pt- LMOM 

## 2014-11-07 NOTE — Progress Notes (Signed)
Sorry, yes it is

## 2014-11-11 ENCOUNTER — Other Ambulatory Visit: Payer: Self-pay

## 2014-11-11 NOTE — Progress Notes (Signed)
Patient is aware. I explained everything to the pt and her husband at her request.   Please set up referral to [facility].

## 2014-11-12 ENCOUNTER — Other Ambulatory Visit: Payer: Self-pay

## 2014-11-12 ENCOUNTER — Telehealth: Payer: Self-pay

## 2014-11-12 ENCOUNTER — Other Ambulatory Visit: Payer: Self-pay | Admitting: Nurse Practitioner

## 2014-11-12 ENCOUNTER — Other Ambulatory Visit: Payer: Self-pay | Admitting: Gastroenterology

## 2014-11-12 DIAGNOSIS — R1012 Left upper quadrant pain: Secondary | ICD-10-CM

## 2014-11-12 NOTE — Telephone Encounter (Signed)
APPOINTMENT MADE AND PATIENT AWARE OF DATE AND TIME  °

## 2014-11-12 NOTE — Telephone Encounter (Signed)
[name], please schedule asap ov.

## 2014-11-12 NOTE — Progress Notes (Signed)
Referrals have been maded

## 2014-11-12 NOTE — Telephone Encounter (Signed)
She is expedited office visit with extender to evaluate symptoms further.

## 2014-11-12 NOTE — Telephone Encounter (Signed)
Pt called- she said she is still having upper abd pain. She also has nausea but no vomiting. She said sometimes if she drinks milk it will help but not always. She is taking carafate, zofran and dexilant as prescribed.   She wants to know if there is anything else she can take or do to make her stomach stop hurting?

## 2014-11-14 ENCOUNTER — Ambulatory Visit: Payer: Medicaid Other | Admitting: Gastroenterology

## 2014-11-20 ENCOUNTER — Ambulatory Visit (INDEPENDENT_AMBULATORY_CARE_PROVIDER_SITE_OTHER): Payer: Medicaid Other | Admitting: Gastroenterology

## 2014-11-20 ENCOUNTER — Encounter: Payer: Self-pay | Admitting: Gastroenterology

## 2014-11-20 VITALS — BP 134/84 | HR 80 | Temp 97.9°F | Ht 64.0 in | Wt 215.6 lb

## 2014-11-20 DIAGNOSIS — R1013 Epigastric pain: Secondary | ICD-10-CM | POA: Diagnosis not present

## 2014-11-20 DIAGNOSIS — K5900 Constipation, unspecified: Secondary | ICD-10-CM

## 2014-11-20 DIAGNOSIS — G8929 Other chronic pain: Secondary | ICD-10-CM | POA: Diagnosis not present

## 2014-11-20 LAB — COMPREHENSIVE METABOLIC PANEL
ALBUMIN: 4.2 g/dL (ref 3.5–5.2)
ALK PHOS: 68 U/L (ref 39–117)
ALT: 15 U/L (ref 0–35)
AST: 15 U/L (ref 0–37)
BUN: 14 mg/dL (ref 6–23)
CO2: 22 mEq/L (ref 19–32)
Calcium: 10.2 mg/dL (ref 8.4–10.5)
Chloride: 107 mEq/L (ref 96–112)
Creat: 0.57 mg/dL (ref 0.50–1.10)
Glucose, Bld: 93 mg/dL (ref 70–99)
POTASSIUM: 4 meq/L (ref 3.5–5.3)
SODIUM: 138 meq/L (ref 135–145)
TOTAL PROTEIN: 7.2 g/dL (ref 6.0–8.3)
Total Bilirubin: 0.3 mg/dL (ref 0.2–1.2)

## 2014-11-20 LAB — CBC WITH DIFFERENTIAL/PLATELET
Basophils Absolute: 0 10*3/uL (ref 0.0–0.1)
Basophils Relative: 0 % (ref 0–1)
Eosinophils Absolute: 0.2 10*3/uL (ref 0.0–0.7)
Eosinophils Relative: 2 % (ref 0–5)
HEMATOCRIT: 43 % (ref 36.0–46.0)
Hemoglobin: 14.7 g/dL (ref 12.0–15.0)
Lymphocytes Relative: 28 % (ref 12–46)
Lymphs Abs: 2.5 10*3/uL (ref 0.7–4.0)
MCH: 32.2 pg (ref 26.0–34.0)
MCHC: 34.2 g/dL (ref 30.0–36.0)
MCV: 94.3 fL (ref 78.0–100.0)
MONO ABS: 1 10*3/uL (ref 0.1–1.0)
MPV: 10.4 fL (ref 8.6–12.4)
Monocytes Relative: 11 % (ref 3–12)
NEUTROS ABS: 5.2 10*3/uL (ref 1.7–7.7)
Neutrophils Relative %: 59 % (ref 43–77)
Platelets: 325 10*3/uL (ref 150–400)
RBC: 4.56 MIL/uL (ref 3.87–5.11)
RDW: 14.3 % (ref 11.5–15.5)
WBC: 8.8 10*3/uL (ref 4.0–10.5)

## 2014-11-20 LAB — LIPASE: LIPASE: 22 U/L (ref 0–75)

## 2014-11-20 MED ORDER — SUCRALFATE 1 GM/10ML PO SUSP: ORAL | Status: DC

## 2014-11-20 MED ORDER — LIDOCAINE VISCOUS 2 % MT SOLN: OROMUCOSAL | Status: DC

## 2014-11-20 NOTE — Patient Instructions (Signed)
1. New RX for lidocaine and carafate sent to your pharmacy. 2. Please have your labs done.  3. We will check on your referral to [facility] and get back in touch with you.  4. Keep your appointment with gyn.

## 2014-11-20 NOTE — Progress Notes (Signed)
Primary Care Physician: Rosita Fire, MD  Primary Gastroenterologist:  Garfield Cornea, MD  Chief Complaint  Patient presents with  . Abdominal Pain  . Nausea    HPI: [name] is a 59 y.o. female here follow up of ongoing epigastric pain, nausea. Colonoscopy  in 10/2014 multiple mamillations in the rectum, multiple polyps, two tubular adenomas. Next TCS in 10/2019. EGD was normal. S/p empiric esophageal dilation. CT A/P with contrast 10/2014 showed new 1.3cm lymph node along right lateral margin of the rectosigmoid with possible rectal wall thickening, ?artifactual. Rectal EUS planned to be complete. GYN evaluation recommended as well.   Patient states she has not heard from [facility], although her listed phone number is out of service. new number provided today. She complains of intermittent, epigastric pain. Nausea sometimes. No vomiting. Not necessariy related to food/drinks. Milk helps at times. She is not sure if carafate helps. No typical heartburn. Recent ED evaluation in 09/2014, she had etoh level 279. She states she was in a "real bad place". She states she has had no etoh in 2 weeks and denies h/o daily or frequent heavy use. C/o constiaption. BM twice per week. Hard stool. No melna, brbpr.     Current Outpatient Prescriptions  Medication Sig Dispense Refill  . acetaminophen (TYLENOL) 500 MG tablet Take 500 mg by mouth every 6 (six) hours as needed (pain).     Marland Kitchen albuterol (PROVENTIL HFA;VENTOLIN HFA) 108 (90 BASE) MCG/ACT inhaler Inhale 2 puffs into the lungs every 6 (six) hours as needed for wheezing or shortness of breath.    Marland Kitchen albuterol (PROVENTIL) (2.5 MG/3ML) 0.083% nebulizer solution Take 3 mLs (2.5 mg total) by nebulization every 6 (six) hours as needed for wheezing or shortness of breath. 75 mL 12  . albuterol-ipratropium (COMBIVENT) 18-103 MCG/ACT inhaler Inhale 2 puffs into the lungs every 6 (six) hours as needed for shortness of breath.     Marland Kitchen CARAFATE 1  GM/10ML suspension TAKE 10MLs BY MOUTH FOUR TIMES DAILY. 420 mL 0  . dexlansoprazole (DEXILANT) 60 MG capsule Take 1 capsule (60 mg total) by mouth daily. 30 capsule 11  . estradiol (ESTRACE) 1 MG tablet TAKE ONE TABLET BY MOUTH DAILY. 30 tablet 11  . Fluticasone Furoate-Vilanterol (BREO ELLIPTA) 100-25 MCG/INH AEPB Inhale 1 puff into the lungs daily.    . furosemide (LASIX) 20 MG tablet Take 20 mg by mouth daily as needed for fluid.    Marland Kitchen lisinopril (PRINIVIL,ZESTRIL) 5 MG tablet Take 5 mg by mouth daily.    . medroxyPROGESTERone (PROVERA) 2.5 MG tablet TAKE ONE TABLET BY MOUTH DAILY. 30 tablet 11  . metoprolol tartrate (LOPRESSOR) 12.5 mg TABS tablet Take 0.5 tablets (12.5 mg total) by mouth 2 (two) times daily. 30 tablet 1  . ondansetron (ZOFRAN) 4 MG tablet TAKE ONE TABLET BY MOUTH EVERY 8 HOURS AS NEEDED FOR NAUSEA/VOMITING. 30 tablet 0  . theophylline (THEO-24) 200 MG 24 hr capsule Take 200 mg by mouth daily.     No current facility-administered medications for this visit.    Allergies as of 11/20/2014 - Review Complete 11/20/2014  Allergen Reaction Noted  . Penicillins Other (See Comments) 02/07/2012  . Septra [sulfamethoxazole-trimethoprim] Other (See Comments) 02/07/2012   Past Surgical History  Procedure Laterality Date  . Kidney stone surgery    . Cholecystectomy    . Hernia repair      Dr. Arnoldo Morale  . Colonoscopy  July 2010    Dr. Arnoldo Morale: 3 rectal polyps,  not enough tissue for pathologic examination, recommended surveillance in 3 years  . Esophagogastroduodenoscopy  July 2010    Dr. Arnoldo Morale: gastritis and duodenitis, H.pylori negative  . Colonoscopy N/A 10/23/2014    RMR: Multiple colonic polyps removed as described above. No endoscopic explaniation for abdominal pain. however. next tcs 10/2019  . Esophagogastroduodenoscopy N/A 10/23/2014    RMR: Normal EGD. Status post passage of a Maloney dilator. Today's finding s would not explain abdominal pain  . Maloney dilation N/A  10/23/2014    Procedure: Venia Minks DILATION;  Surgeon: Daneil Dolin, MD;  [facility];  Service: Endoscopy;  Laterality: N/A;    ROS:  General: Negative for anorexia, weight loss, fever, chills, fatigue, weakness. ENT: Negative for hoarseness, difficulty swallowing , nasal congestion. CV: Negative for chest pain, angina, palpitations, dyspnea on exertion, peripheral edema.  Respiratory: Negative for dyspnea at rest, dyspnea on exertion, cough, sputum, wheezing.  GI: See history of present illness. GU:  Negative for dysuria, hematuria, urinary incontinence, urinary frequency, nocturnal urination.  Endo: Negative for unusual weight change.    Physical Examination:   BP 134/84 mmHg  Pulse 80  Temp(Src) 97.9 F (36.6 C) (Oral)  Ht '5\' 4"'$  (1.626 m)  Wt 215 lb 9.6 oz (97.796 kg)  BMI 36.99 kg/m2  General: Well-nourished, well-developed in no acute distress.  Eyes: No icterus. Mouth: Oropharyngeal mucosa moist and pink , no lesions erythema or exudate. Lungs: Clear to auscultation bilaterally.  Heart: Regular rate and rhythm, no murmurs rubs or gallops.  Abdomen: Bowel sounds are normal, moderate epigastric tenderness, nondistended, no hepatosplenomegaly or masses, no abdominal bruits or hernia , no rebound or guarding.   Extremities: No lower extremity edema. No clubbing or deformities. Neuro: Alert and oriented x 4   Skin: Warm and dry, no jaundice.   Psych: Alert and cooperative, normal mood and affect.  Labs:  Alcohol 279 on 09/17/14 in ER  Lab Results  Component Value Date   WBC 10.0 09/17/2014   HGB 14.9 09/17/2014   HCT 42.3 09/17/2014   MCV 97.5 09/17/2014   PLT 250 09/17/2014   Lab Results  Component Value Date   CREATININE 0.69 09/17/2014   BUN 7 09/17/2014   NA 136 09/17/2014   K 3.3* 09/17/2014   CL 104 09/17/2014   CO2 21 09/17/2014   Lab Results  Component Value Date   ALT 37* 07/05/2014   AST 28 07/05/2014   ALKPHOS 99 07/05/2014   BILITOT  0.6 07/05/2014   Lab Results  Component Value Date   LIPASE 21 12/31/2013   Alcohol level 279 09/2014  Imaging Studies: Ct Abdomen Pelvis W Contrast  10/28/2014   CLINICAL DATA:  Upper mid abdominal pain for 8-9 months with recent worsening, history of hernia repair, initial encounter.  EXAM: CT ABDOMEN AND PELVIS WITH CONTRAST  TECHNIQUE: Multidetector CT imaging of the abdomen and pelvis was performed using the standard protocol following bolus administration of intravenous contrast.  CONTRAST:  148m OMNIPAQUE IOHEXOL 300 MG/ML  SOLN  COMPARISON:  02/07/2012.  FINDINGS: Lower chest: Lung bases show scattered subsegmental atelectasis or scarring. Tiny right pleural effusion. Heart size normal. Pre pericardiac lymph nodes are sub cm in short axis size. No pericardial effusion.  Hepatobiliary: Visualized portion of the liver is unremarkable. Cholecystectomy. No biliary ductal dilatation.  Pancreas: Negative.  Spleen: Negative.  Adrenals/Urinary Tract: Adrenal glands and kidneys are unremarkable. Ureters are decompressed. Bladder is low in volume and grossly unremarkable.  Stomach/Bowel: Stomach, small  bowel, appendix and majority of the colon are unremarkable. There is questionable eccentric wall thickening involving the rectosigmoid colon, along the right lateral margin (series 2, images 71-72), versus is tortuosity.  Vascular/Lymphatic: Atherosclerotic calcification of the arterial vasculature without abdominal aortic aneurysm. There is a new 1.3 cm lymph node along the right lateral margin of the rectosigmoid colon (series 2, image 72).  Reproductive: Uterus and ovaries are visualized.  Other: Mesenteries and peritoneum are unremarkable. No free fluid. Postoperative changes along the upper midline ventral abdominal wall.  Musculoskeletal: Degenerative changes the symphysis pubis and spine. Changes of avascular necrosis in the femoral heads bilaterally. No worrisome lytic or sclerotic lesions.   IMPRESSION: 1. No findings to explain the patient's abdominal pain. 2. New 1.3 cm lymph node along the right lateral margin of the rectosigmoid with possible rectal wall thickening versus tortuosity. Correlation with colonoscopy is recommended, if not already performed. These results will be called to the ordering clinician or representative by the Radiologist Assistant, and communication documented in the PACS or zVision Dashboard. 3. Tiny right pleural effusion. 4. Mild changes of avascular necrosis in the femoral heads bilaterally.   Electronically Signed   By: Lorin Picket M.D.   On: 10/28/2014 14:42

## 2014-11-21 ENCOUNTER — Encounter: Payer: Self-pay | Admitting: Gastroenterology

## 2014-11-21 DIAGNOSIS — K5900 Constipation, unspecified: Secondary | ICD-10-CM | POA: Insufficient documentation

## 2014-11-21 NOTE — Assessment & Plan Note (Signed)
Intermittent epigastric pain associated with nausea as outlined. Recent EGD/TCS findings. Due for rectal EUS to further evaluate CT findings in rectosigmoid region and new lymph node.   Regarding epigastric pain, may be secondary to non-ulcer dyspepsia but given etoh use we need to consider pancreatitis. Need to consider biliary etiology (she is s/p cholecystectomy). Labs to be done. Add short-term viscous lidocaine and continue carafate and Dexilant. Complete etoh cessation.

## 2014-11-21 NOTE — Progress Notes (Signed)
Please check on Dr. Roseanne Kaufman referral to Dr. Jerene Pitch for rectal EUS. Patient has not heard but phone number previously listed is no longer primary number and is currently out of service.

## 2014-11-21 NOTE — Assessment & Plan Note (Signed)
At last visit she has increased stool. Try fiber, increase dietary slowly, may add supplement like benefiber or fiberchoice.

## 2014-11-22 ENCOUNTER — Telehealth: Payer: Self-pay | Admitting: Internal Medicine

## 2014-11-22 NOTE — Telephone Encounter (Signed)
Patient called to let us know that she has an appointment at [facility] on June 9 at [facility]

## 2014-11-22 NOTE — Telephone Encounter (Signed)
Patient has an appointment at [facility] on 12/12/14 @ 3 pm

## 2014-11-22 NOTE — Telephone Encounter (Signed)
Noted. Thanks.

## 2014-11-22 NOTE — Progress Notes (Signed)
I talked with [facility] and they have been trying to get in touch with her but the phone number on file for her is not working. So I call her this morning and she will call them today to get the rectal [place] scheduled.

## 2014-11-22 NOTE — Progress Notes (Signed)
I made patient aware that per Ginger Lurline Hare she needs to call [facility] to make her appt because they have been trying to reach her for several weeks and have been unsuccessful.     Patient stated she will call [facility] and make appt and call us back with the updated appt.

## 2014-11-24 NOTE — Progress Notes (Signed)
Quick Note:  Please let patient know her labs are normal.  Recommendations as outlined at time of OV.  Keep OV at [facility] as planned. Avoid all etoh. Call if ongoingn symptoms. ______

## 2014-11-26 ENCOUNTER — Other Ambulatory Visit: Payer: Self-pay

## 2014-11-26 MED ORDER — LIDOCAINE VISCOUS 2 % MT SOLN: OROMUCOSAL | Status: DC

## 2014-11-27 NOTE — Progress Notes (Signed)
CC'ED TO PCP 

## 2014-12-04 ENCOUNTER — Telehealth: Payer: Self-pay

## 2014-12-04 ENCOUNTER — Ambulatory Visit (INDEPENDENT_AMBULATORY_CARE_PROVIDER_SITE_OTHER): Payer: Medicaid Other | Admitting: Obstetrics and Gynecology

## 2014-12-04 ENCOUNTER — Other Ambulatory Visit (HOSPITAL_COMMUNITY)
Admission: RE | Admit: 2014-12-04 | Discharge: 2014-12-04 | Disposition: A | Discharge status: Home / Self Care | Payer: Medicaid Other | Source: Ambulatory Visit | Attending: Obstetrics and Gynecology | Admitting: Obstetrics and Gynecology

## 2014-12-04 ENCOUNTER — Encounter: Payer: Self-pay | Admitting: Obstetrics and Gynecology

## 2014-12-04 VITALS — BP 154/72 | HR 64 | Ht 64.0 in | Wt 223.0 lb

## 2014-12-04 DIAGNOSIS — R87612 Low grade squamous intraepithelial lesion on cytologic smear of cervix (LGSIL): Secondary | ICD-10-CM

## 2014-12-04 DIAGNOSIS — Z1151 Encounter for screening for human papillomavirus (HPV): Secondary | ICD-10-CM | POA: Diagnosis present

## 2014-12-04 DIAGNOSIS — Z124 Encounter for screening for malignant neoplasm of cervix: Secondary | ICD-10-CM

## 2014-12-04 DIAGNOSIS — R87810 Cervical high risk human papillomavirus (HPV) DNA test positive: Secondary | ICD-10-CM | POA: Diagnosis present

## 2014-12-04 DIAGNOSIS — Z01411 Encounter for gynecological examination (general) (routine) with abnormal findings: Secondary | ICD-10-CM | POA: Insufficient documentation

## 2014-12-04 MED ORDER — PROMETHAZINE HCL 25 MG PO TABS: 25.0000 mg | ORAL_TABLET | Freq: Four times a day (QID) | ORAL | Status: DC | PRN

## 2014-12-04 NOTE — Telephone Encounter (Signed)
Pt states that over the last couple of days she has been extremely nauseated. States the Zofran is not working.  When she eats she feels worse. Stomach is hurting and is a 10/10 scale.

## 2014-12-04 NOTE — Telephone Encounter (Signed)
I spoke with the pt. She said the lidocaine is helping a lot with the pain. She said the pain is not her concern at this moment. The nausea is awful. It started about 4 days ago and hasn't stopped. No vomiting. The zofran is not working. Gets worse with eating but can also have really bad episodes when she doesn't eat. No fever reported. She has not tried any other nausea medications.  Pt has been trying to eat boiled eggs to see if that would make her feel better. Asked her to stick with clear liquids tonight. Last bm was today and it was normal.

## 2014-12-04 NOTE — Telephone Encounter (Signed)
Paschal Dopp to call patient, if truly having 10/10 abdominal pain then she should go to ER. Phenergan RX sent in for nausea.   Please find out if patient is having regular BMs.

## 2014-12-04 NOTE — Progress Notes (Addendum)
Patient ID: [name], female   DOB: [birth year], 59 y.o.   MRN: [id number]   [facility] Visit  Patient name: [name] MRN [id number]  Date of birth: 1955/08/21  CC & HPI:  [name] is a 59 y.o. female presenting today for a pap smear. She has seen multiple  providers for multiple GI complaints. She has been told to see [place] for a possible "shadow" on some kind of pelvic imaging, that patient is unable to specify. She has been referred to GI at [facility]. Patient reports undergoing hormone therapy and is no longer having menses. Patient states she quit 3-4 weeks. She states she drinks a couple alcohol drinks whenever she is stressed. Apparently she drinks almost daily, certainly weekly Patient states she prays and goes to church. Her pap smear done in 2015 shows low-grade abnormalities.(LSIL)  ROS:  A complete 10 system review of systems was obtained and all systems are negative except as noted in the HPI and PMH.    Pertinent History Reviewed:   Reviewed: Significant for n/a Medical         Past Medical History  Diagnosis Date  . COPD (chronic obstructive pulmonary disease)   . Hypertension   . Bipolar 1 disorder   . Anxiety   . Depression                               Surgical Hx:    Past Surgical History  Procedure Laterality Date  . Kidney stone surgery    . Cholecystectomy    . Hernia repair      Dr. Arnoldo Morale  . Colonoscopy  July 2010    Dr. Arnoldo Morale: 3 rectal polyps, not enough tissue for pathologic examination, recommended surveillance in 3 years  . Esophagogastroduodenoscopy  July 2010    Dr. Arnoldo Morale: gastritis and duodenitis, H.pylori negative  . Colonoscopy N/A 10/23/2014    RMR: Multiple colonic polyps removed as described above. No endoscopic explaniation for abdominal pain. however. next tcs 10/2019  . Esophagogastroduodenoscopy N/A 10/23/2014    RMR: Normal EGD. Status post passage of a Maloney dilator. Today's  finding s would not explain abdominal pain  . Maloney dilation N/A 10/23/2014    Procedure: Venia Minks DILATION;  Surgeon: Daneil Dolin, MD;  Location: [facility];  Service: Endoscopy;  Laterality: N/A;   Medications: Reviewed & Updated - see associated section                       Current outpatient prescriptions:  .  acetaminophen (TYLENOL) 500 MG tablet, Take 500 mg by mouth every 6 (six) hours as needed (pain). , Disp: , Rfl:  .  albuterol (PROVENTIL HFA;VENTOLIN HFA) 108 (90 BASE) MCG/ACT inhaler, Inhale 2 puffs into the lungs every 6 (six) hours as needed for wheezing or shortness of breath., Disp: , Rfl:  .  albuterol (PROVENTIL) (2.5 MG/3ML) 0.083% nebulizer solution, Take 3 mLs (2.5 mg total) by nebulization every 6 (six) hours as needed for wheezing or shortness of breath., Disp: 75 mL, Rfl: 12 .  albuterol-ipratropium (COMBIVENT) 18-103 MCG/ACT inhaler, Inhale 2 puffs into the lungs every 6 (six) hours as needed for shortness of breath. , Disp: , Rfl:  .  dexlansoprazole (DEXILANT) 60 MG capsule, Take 1 capsule (60 mg total) by mouth daily., Disp: 30 capsule, Rfl: 11 .  estradiol (ESTRACE) 1 MG tablet, TAKE  ONE TABLET BY MOUTH DAILY., Disp: 30 tablet, Rfl: 11 .  furosemide (LASIX) 20 MG tablet, Take 20 mg by mouth daily as needed for fluid., Disp: , Rfl:  .  lidocaine (XYLOCAINE) 2 % solution, Take 10cc by mouth three times a day before meals and at bedtime as needed for abdominal pain. No more than 4 doses per day., Disp: 200 mL, Rfl: 0 .  lisinopril (PRINIVIL,ZESTRIL) 5 MG tablet, Take 5 mg by mouth daily., Disp: , Rfl:  .  medroxyPROGESTERone (PROVERA) 2.5 MG tablet, TAKE ONE TABLET BY MOUTH DAILY., Disp: 30 tablet, Rfl: 11 .  metoprolol tartrate (LOPRESSOR) 12.5 mg TABS tablet, Take 0.5 tablets (12.5 mg total) by mouth 2 (two) times daily., Disp: 30 tablet, Rfl: 1 .  ondansetron (ZOFRAN) 4 MG tablet, TAKE ONE TABLET BY MOUTH EVERY 8 HOURS AS NEEDED FOR NAUSEA/VOMITING., Disp: 30  tablet, Rfl: 0 .  sucralfate (CARAFATE) 1 GM/10ML suspension, TAKE 10MLs BY MOUTH FOUR TIMES DAILY before meals and at bedtime., Disp: 420 mL, Rfl: 2 .  theophylline (THEO-24) 200 MG 24 hr capsule, Take 200 mg by mouth daily., Disp: , Rfl:  .  Fluticasone Furoate-Vilanterol (BREO ELLIPTA) 100-25 MCG/INH AEPB, Inhale 1 puff into the lungs daily., Disp: , Rfl:    Social History: Reviewed -  reports that she has been smoking Cigarettes.  She has been smoking about 0.50 packs per day. She has quit using smokeless tobacco.  Objective Findings:  Vitals: Blood pressure 154/72, pulse 64, height '5\' 4"'$  (1.626 m), weight 223 lb (101.152 kg).  Physical Examination: General appearance - alert, well appearing, and in no distress, oriented to person, place, and time and overweight Mental status - alert, oriented to person, place, and time, normal mood, behavior, speech, dress, motor activity, and thought processes, affect appropriate to mood Pelvic - normal external genitalia, vulva, vagina, cervix, uterus and adnexa,  VULVA: normal appearing vulva with no masses, tenderness or lesions,  VAGINA: normal appearing vagina with normal color and discharge, no lesions,  CERVIX: Multiparous cervix is normal in appearance ,  UTERUS: small, mobile.   ADNEXA: normal adnexa in size, nontender and no masses,  RECTAL: not indicated; pt is being followed for multiple GI concerns   Assessment & Plan:   A:  1. Pap smear from 09/2013 LSIL, and shows low-grade abnormalities. Pap smear done today. 2. Alcohol use P:  1. Pt reassured that she does not have cancer. Discussed with patient that we will monitor her pap smears for several years. Results available before July.   This chart was SCRIBED for Mallory Shirk, MD by Stephania Fragmin, ED Scribe. This patient was seen in room 2 and the patient's care was started at 11:08 AM.  I personally performed the services described in this documentation, which was SCRIBED in my  presence. The recorded information has been reviewed and considered accurate. It has been edited as necessary during review. Jonnie Kind, MD

## 2014-12-04 NOTE — Telephone Encounter (Signed)
Pt is aware. rx has been sent in. 

## 2014-12-05 LAB — CYTOLOGY - PAP

## 2014-12-05 NOTE — Telephone Encounter (Signed)
[name], please schedule ov with RMR.

## 2014-12-05 NOTE — Telephone Encounter (Signed)
Let's make follow up appt with RMR (epig pain/nausea). nonurgent.

## 2014-12-06 ENCOUNTER — Other Ambulatory Visit: Payer: Self-pay | Admitting: Obstetrics and Gynecology

## 2014-12-06 DIAGNOSIS — A5901 Trichomonal vulvovaginitis: Secondary | ICD-10-CM

## 2014-12-06 MED ORDER — METRONIDAZOLE 500 MG PO TABS: 500.0000 mg | ORAL_TABLET | Freq: Two times a day (BID) | ORAL | Status: DC

## 2014-12-09 ENCOUNTER — Encounter: Payer: Self-pay | Admitting: Internal Medicine

## 2014-12-09 ENCOUNTER — Telehealth: Payer: Self-pay | Admitting: *Deleted

## 2014-12-09 NOTE — Telephone Encounter (Signed)
Pt is aware of OV with RMR on 7/12 at 11 and appt card mailed

## 2014-12-09 NOTE — Telephone Encounter (Signed)
-----   Message from Jonnie Kind, MD sent at 12/06/2014  4:39 PM EDT ----- Low grade abnormalities, with Trichomonas present . Will treat , and repeat pap

## 2014-12-09 NOTE — Telephone Encounter (Signed)
Pt informed of abnormal pap results (Low Grade Squamous Intraepithelial Lesion, CIN -1/HPV LSIL, HPV detected, Trich Positive) from 12/04/2014. Pt informed Flagyl sent to [facility] for Tx of Trich. Call transferred to front staff for an appt to be scheduled for Colposcopy per Dr. Glo Herring. All questions answered in regards to colposcopy procedure.

## 2014-12-11 ENCOUNTER — Telehealth: Payer: Self-pay | Admitting: Obstetrics and Gynecology

## 2014-12-11 NOTE — Telephone Encounter (Signed)
Pt states needs Rx for her husband, Ernesha Ramone, DOB 12/29/1956 for treatment of Trich. Pt had Positive Trich results on pap from 12/04/2014 and pt received treatment. Verbal order for Flagyl 2 gm po x 1, no refills. Order called to [facility].

## 2014-12-16 ENCOUNTER — Encounter: Payer: Self-pay | Admitting: Obstetrics and Gynecology

## 2014-12-16 ENCOUNTER — Encounter: Payer: Medicaid Other | Admitting: Obstetrics and Gynecology

## 2014-12-20 ENCOUNTER — Other Ambulatory Visit: Payer: Self-pay

## 2014-12-20 MED ORDER — SUCRALFATE 1 GM/10ML PO SUSP: ORAL | Status: DC

## 2014-12-20 MED ORDER — LIDOCAINE VISCOUS 2 % MT SOLN: OROMUCOSAL | Status: DC

## 2014-12-27 ENCOUNTER — Encounter: Payer: Medicaid Other | Admitting: Obstetrics and Gynecology

## 2014-12-30 ENCOUNTER — Encounter: Payer: Medicaid Other | Admitting: Obstetrics and Gynecology

## 2015-01-01 ENCOUNTER — Encounter: Payer: Self-pay | Admitting: Obstetrics and Gynecology

## 2015-01-01 ENCOUNTER — Other Ambulatory Visit: Payer: Self-pay | Admitting: Obstetrics and Gynecology

## 2015-01-01 ENCOUNTER — Ambulatory Visit (INDEPENDENT_AMBULATORY_CARE_PROVIDER_SITE_OTHER): Payer: Medicaid Other | Admitting: Obstetrics and Gynecology

## 2015-01-01 VITALS — BP 120/78 | Ht 64.0 in

## 2015-01-01 DIAGNOSIS — F5231 Female orgasmic disorder: Secondary | ICD-10-CM | POA: Insufficient documentation

## 2015-01-01 DIAGNOSIS — R87612 Low grade squamous intraepithelial lesion on cytologic smear of cervix (LGSIL): Secondary | ICD-10-CM

## 2015-01-01 DIAGNOSIS — N870 Mild cervical dysplasia: Secondary | ICD-10-CM | POA: Diagnosis not present

## 2015-01-01 NOTE — Progress Notes (Signed)
Patient ID: [name], female   DOB: 1956-06-30, 59 y.o.   MRN: [id number]  LASHAWNNA LAMBRECHT 59 y.o. No obstetric history on file. here for colposcopy for low-grade squamous intraepithelial neoplasia (LGSIL - encompassing HPV,mild dysplasia,CIN I) pap smear on 12/04/2014.  Discussed role for HPV in cervical dysplasia, need for surveillance.  Patient given informed consent, signed copy in the chart, time out was performed.  Placed in lithotomy position. Cervix viewed with speculum and colposcope after application of acetic acid.    Colposcopy adequate? Yes  no visible lesions, no mosaicism, no punctation and no abnormal vasculature; biopsies obtained at anterior and posterior lip at 6, 10, and 2 o'clock positions.   ECC specimen obtained. All specimens were labelled and sent to pathology.   Colposcopy IMPRESSION:  Exocervix is smooth, no visible abnormalities/dysplasia. ECC performed. If ECC positive, will require LEEP. If negative, follow-up pap smear in 1 year.   KOH clue cells negative.   Patient was given post procedure instructions. Will follow up pathology and manage accordingly.  Routine preventative health maintenance measures emphasized.   This chart was SCRIBED for Mallory Shirk, MD by Stephania Fragmin, ED Scribe. This patient was seen in room 2 and the patient's care was started at 2:09 PM.  I personally performed the services described in this documentation, which was SCRIBED in my presence. The recorded information has been reviewed and considered accurate. It has been edited as necessary during review. Jonnie Kind, MD   Addendum: At end of visit pt mentions  To RN anorgasmia x 2 months, will discuss at followup of results.

## 2015-01-01 NOTE — Progress Notes (Signed)
Patient ID: [name], female   DOB: 1956/02/17, 59 y.o.   MRN: [id number] Pt here today for colposcopy. Pt states that she has not been able to have an orgasm for about 2 months and is not sure what's going but wanted to discuss this problem.

## 2015-01-03 ENCOUNTER — Other Ambulatory Visit: Payer: Self-pay | Admitting: Gastroenterology

## 2015-01-07 ENCOUNTER — Other Ambulatory Visit: Payer: Self-pay | Admitting: Nurse Practitioner

## 2015-01-14 ENCOUNTER — Ambulatory Visit: Payer: Medicaid Other | Admitting: Internal Medicine

## 2015-01-14 ENCOUNTER — Telehealth: Payer: Self-pay | Admitting: Internal Medicine

## 2015-01-14 NOTE — Telephone Encounter (Signed)
noted 

## 2015-01-14 NOTE — Telephone Encounter (Signed)
Pt was a no show

## 2015-01-20 ENCOUNTER — Telehealth: Payer: Self-pay | Admitting: Obstetrics and Gynecology

## 2015-01-20 ENCOUNTER — Other Ambulatory Visit: Payer: Self-pay | Admitting: Gastroenterology

## 2015-01-21 NOTE — Telephone Encounter (Signed)
Colpo showed both CIN I on biopsy, and + ECC.  Will discuss LEEP with pt. Left message on Pt's cell phone

## 2015-01-22 NOTE — Telephone Encounter (Signed)
Will recommend LEEP. Pt aware, in discussion, pt has many questions, so appt to discuss options and see if she has other problems will be necessary. appt to discuss made

## 2015-01-29 ENCOUNTER — Ambulatory Visit: Payer: Medicaid Other | Admitting: Obstetrics and Gynecology

## 2015-02-06 ENCOUNTER — Ambulatory Visit: Payer: Medicaid Other | Admitting: Obstetrics and Gynecology

## 2015-02-06 ENCOUNTER — Encounter: Payer: Self-pay | Admitting: Obstetrics and Gynecology

## 2015-02-24 ENCOUNTER — Telehealth: Payer: Self-pay | Admitting: Obstetrics and Gynecology

## 2015-02-24 NOTE — Telephone Encounter (Signed)
Pt was advised to go to [facility] in [place]

## 2015-03-12 ENCOUNTER — Ambulatory Visit: Payer: Medicaid Other | Admitting: Obstetrics and Gynecology

## 2015-03-12 ENCOUNTER — Encounter: Payer: Self-pay | Admitting: Obstetrics and Gynecology

## 2015-03-21 ENCOUNTER — Encounter (HOSPITAL_COMMUNITY): Payer: Self-pay

## 2015-03-21 ENCOUNTER — Emergency Department (HOSPITAL_COMMUNITY)
Admission: EM | Admit: 2015-03-21 | Discharge: 2015-03-21 | Disposition: A | Discharge status: Home / Self Care | Payer: Medicaid Other | Attending: Emergency Medicine | Admitting: Emergency Medicine

## 2015-03-21 DIAGNOSIS — Z720 Tobacco use: Secondary | ICD-10-CM | POA: Diagnosis not present

## 2015-03-21 DIAGNOSIS — Z7951 Long term (current) use of inhaled steroids: Secondary | ICD-10-CM | POA: Insufficient documentation

## 2015-03-21 DIAGNOSIS — I509 Heart failure, unspecified: Secondary | ICD-10-CM | POA: Insufficient documentation

## 2015-03-21 DIAGNOSIS — Z880 Allergy status to penicillin: Secondary | ICD-10-CM | POA: Insufficient documentation

## 2015-03-21 DIAGNOSIS — Z9119 Patient's noncompliance with other medical treatment and regimen: Secondary | ICD-10-CM | POA: Insufficient documentation

## 2015-03-21 DIAGNOSIS — I10 Essential (primary) hypertension: Secondary | ICD-10-CM | POA: Insufficient documentation

## 2015-03-21 DIAGNOSIS — Z793 Long term (current) use of hormonal contraceptives: Secondary | ICD-10-CM | POA: Insufficient documentation

## 2015-03-21 DIAGNOSIS — Z8659 Personal history of other mental and behavioral disorders: Secondary | ICD-10-CM | POA: Insufficient documentation

## 2015-03-21 DIAGNOSIS — J449 Chronic obstructive pulmonary disease, unspecified: Secondary | ICD-10-CM | POA: Insufficient documentation

## 2015-03-21 DIAGNOSIS — Z79899 Other long term (current) drug therapy: Secondary | ICD-10-CM | POA: Insufficient documentation

## 2015-03-21 DIAGNOSIS — Z9114 Patient's other noncompliance with medication regimen: Secondary | ICD-10-CM

## 2015-03-21 DIAGNOSIS — R42 Dizziness and giddiness: Secondary | ICD-10-CM | POA: Diagnosis present

## 2015-03-21 HISTORY — DX: Heart failure, unspecified: I50.9

## 2015-03-21 LAB — BASIC METABOLIC PANEL
Anion gap: 8 (ref 5–15)
BUN: 13 mg/dL (ref 6–20)
CO2: 28 mmol/L (ref 22–32)
CREATININE: 0.58 mg/dL (ref 0.44–1.00)
Calcium: 9.8 mg/dL (ref 8.9–10.3)
Chloride: 103 mmol/L (ref 101–111)
GFR calc Af Amer: >60 mL/min (ref >60)
Glucose, Bld: 111 mg/dL — ABNORMAL HIGH (ref 65–99)
Potassium: 3.2 mmol/L — ABNORMAL LOW (ref 3.5–5.1)
SODIUM: 139 mmol/L (ref 135–145)

## 2015-03-21 MED ORDER — LISINOPRIL 10 MG PO TABS: 10.0000 mg | ORAL_TABLET | Freq: Every day | ORAL | Status: DC

## 2015-03-21 MED ORDER — FUROSEMIDE 20 MG PO TABS: 20.0000 mg | ORAL_TABLET | Freq: Every day | ORAL | Status: DC

## 2015-03-21 NOTE — Discharge Instructions (Signed)

## 2015-03-21 NOTE — ED Provider Notes (Signed)
CSN: [id number]     Arrival date & time 03/21/15  3016 History   First MD Initiated Contact with Patient 03/21/15 1009     Chief Complaint  Patient presents with  . Hypertension     (Consider location/radiation/quality/duration/timing/severity/associated sxs/prior Treatment) The history is provided by the patient and the spouse.   [name] is a 59 y.o. female  presenting for evaluation of hypertension.  She was seen by her dentist this morning in anticipation of dental extractions and her procedure was denied as her blood pressure was elevated.  Patient states her numbers were approximately 175/110.  She denies that she was anxious about her dental procedure, however husband at bedside doubts this statement.  She has been on blood pressure medication in the past but has run out within the past month and she does not have a current PCP.  She is scheduled to establish care with the new doctor in [place] in 3 weeks.  She denies chest pain or shortness of breath,  peripheral edema.  She does endorse feeling slightly lightheaded with mild headache today, but states this could be due to lack of by mouth intake prior to arrival in anticipation of her dental extractions.  She denies focal weakness or numbness.     Past Medical History  Diagnosis Date  . COPD (chronic obstructive pulmonary disease)   . Hypertension   . Bipolar 1 disorder   . Anxiety   . Depression   . Vaginal Pap smear, abnormal   . CHF (congestive heart failure)    Past Surgical History  Procedure Laterality Date  . Kidney stone surgery    . Cholecystectomy    . Hernia repair      Dr. Arnoldo Morale  . Colonoscopy  July 2010    Dr. Arnoldo Morale: 3 rectal polyps, not enough tissue for pathologic examination, recommended surveillance in 3 years  . Esophagogastroduodenoscopy  July 2010    Dr. Arnoldo Morale: gastritis and duodenitis, H.pylori negative  . Colonoscopy N/A 10/23/2014    RMR: Multiple colonic polyps removed as  described above. No endoscopic explaniation for abdominal pain. however. next tcs 10/2019  . Esophagogastroduodenoscopy N/A 10/23/2014    RMR: Normal EGD. Status post passage of a Maloney dilator. Today's finding s would not explain abdominal pain  . Maloney dilation N/A 10/23/2014    Procedure: Venia Minks DILATION;  Surgeon: Daneil Dolin, MD;  Location: [facility];  Service: Endoscopy;  Laterality: N/A;   Family History  Problem Relation Age of Onset  . Adopted: Yes  . COPD Mother    Social History  Substance Use Topics  . Smoking status: Current Some Day Smoker -- 0.50 packs/day for 42 years    Types: Cigarettes  . Smokeless tobacco: Never Used     Comment: 1/2 pack daily  . Alcohol Use: 0.0 oz/week    0 Standard drinks or equivalent per week     Comment: quit 11/03/2014. used to drink couple of 40 ounces. would do it couple of times per week. denies chronic use.   OB History    No data available     Review of Systems  Constitutional: Negative for fever.  HENT: Negative for ear pain and sinus pressure.   Eyes: Negative.  Negative for visual disturbance.  Respiratory: Negative for chest tightness and shortness of breath.   Cardiovascular: Negative for chest pain.  Gastrointestinal: Negative for nausea and abdominal pain.  Genitourinary: Negative.   Musculoskeletal: Negative for arthralgias and neck pain.  Skin: Negative.   Neurological: Positive for light-headedness and headaches. Negative for dizziness, weakness and numbness.  Psychiatric/Behavioral: Negative.       Allergies  Penicillins and Septra  Home Medications   Prior to Admission medications   Medication Sig Start Date End Date Taking? Authorizing Provider  acetaminophen (TYLENOL) 500 MG tablet Take 500 mg by mouth every 6 (six) hours as needed (pain).    Yes Historical Provider, MD  albuterol (PROVENTIL) (2.5 MG/3ML) 0.083% nebulizer solution Take 3 mLs (2.5 mg total) by nebulization every 6 (six) hours as  needed for wheezing or shortness of breath. 07/12/13  Yes Thurnell Lose, MD  albuterol-ipratropium (COMBIVENT) 18-103 MCG/ACT inhaler Inhale 2 puffs into the lungs every 6 (six) hours as needed for shortness of breath.    Yes Historical Provider, MD  budesonide-formoterol (SYMBICORT) 160-4.5 MCG/ACT inhaler Inhale 2 puffs into the lungs 2 (two) times daily.   Yes Historical Provider, MD  estradiol (ESTRACE) 1 MG tablet TAKE ONE TABLET BY MOUTH DAILY. 10/01/14  Yes Florian Buff, MD  Fluticasone Furoate-Vilanterol (BREO ELLIPTA) 100-25 MCG/INH AEPB Inhale 1 puff into the lungs daily.   Yes Historical Provider, MD  ibuprofen (ADVIL,MOTRIN) 800 MG tablet Take 800 mg by mouth every 6 (six) hours as needed for moderate pain.   Yes Historical Provider, MD  lidocaine (XYLOCAINE) 2 % solution Take 10cc by mouth three times a day before meals and at bedtime as needed for abdominal pain. No more than 4 doses per day. 12/20/14  Yes Carlis Stable, NP  medroxyPROGESTERone (PROVERA) 2.5 MG tablet TAKE ONE TABLET BY MOUTH DAILY. 10/04/14  Yes Florian Buff, MD  theophylline (THEO-24) 200 MG 24 hr capsule Take 200 mg by mouth daily.   Yes Historical Provider, MD  CARAFATE 1 GM/10ML suspension TAKE 10MLs BY MOUTH FOUR TIMES DAILY BEFORE MEALS AND AT BEDTIME. Patient not taking: Reported on 03/21/2015 01/07/15   Orvil Feil, NP  dexlansoprazole (DEXILANT) 60 MG capsule Take 1 capsule (60 mg total) by mouth daily. Patient not taking: Reported on 03/21/2015 08/29/14   Mahala Menghini, PA-C  furosemide (LASIX) 20 MG tablet Take 1 tablet (20 mg total) by mouth daily. 03/21/15   Evalee Jefferson, PA-C  lisinopril (PRINIVIL,ZESTRIL) 10 MG tablet Take 1 tablet (10 mg total) by mouth daily. 03/21/15   Evalee Jefferson, PA-C  promethazine (PHENERGAN) 25 MG tablet TAKE (1) TABLET BY MOUTH EVERY SIX HOURS AS NEEDED. Patient not taking: Reported on 03/21/2015 01/21/15   Orvil Feil, NP   BP 138/81 mmHg  Pulse 71  Temp(Src) 97.7 F (36.5 C) (Oral)   Resp 20  Ht '5\' 4"'$  (1.626 m)  Wt 220 lb (99.791 kg)  BMI 37.74 kg/m2  SpO2 97% Physical Exam  Constitutional: She is oriented to person, place, and time. She appears well-developed and well-nourished. No distress.  HENT:  Head: Normocephalic and atraumatic.  Eyes: EOM are normal. Pupils are equal, round, and reactive to light.  Neck: Normal range of motion.  Cardiovascular: Normal rate, regular rhythm, normal heart sounds and intact distal pulses.   Pulmonary/Chest: Effort normal and breath sounds normal. No respiratory distress.  Abdominal: Soft. Bowel sounds are normal. There is no tenderness.  Musculoskeletal: Normal range of motion.  Neurological: She is alert and oriented to person, place, and time. She has normal strength. No cranial nerve deficit. Coordination and gait normal.  Skin: Skin is warm and dry.  Psychiatric: She has a normal mood and affect.  Nursing note and vitals reviewed.   ED Course  Procedures (including critical care time) Labs Review Labs Reviewed  BASIC METABOLIC PANEL - Abnormal; Notable for the following:    Potassium 3.2 (*)    Glucose, Bld 111 (*)    All other components within normal limits    Imaging Review No results found. I have personally reviewed and evaluated these images and lab results as part of my medical decision-making.   EKG Interpretation   Date/Time:  Friday March 21 2015 10:12:35 EDT Ventricular Rate:  77 PR Interval:  180 QRS Duration: 89 QT Interval:  399 QTC Calculation: 452 R Axis:   69 Text Interpretation:  Sinus rhythm Baseline wander in lead(s) V6 Confirmed  by Jeneen Rinks  MD, [place] ([id number]) on 03/21/2015 10:16:33 AM      MDM   Final diagnoses:  Essential hypertension  H/O medication noncompliance    VSS reviewed.  Pt pulse ox 100% on room air at time of dc.  Patient with a normal neuro exam and relatively well-controlled blood pressures while in the ED accept for her BP on initial arrival which was 155/101.   I suspect some degree of anxiety upon first arrival and probably was what drove her blood pressure at her dental appointment.  Labs reviewed and her creatinine is normal.  We will start her on her blood pressure medications including Lasix and lisinopril.  Planned follow-up with her new primary doctor as scheduled.  Follow-up with her dentist as she is planning.  The patient appears reasonably screened and/or stabilized for discharge and I doubt any other medical condition or other [facility] requiring further screening, evaluation, or treatment in the ED at this time prior to discharge.   Evalee Jefferson, PA-C 03/21/15 1726  Tanna Furry, MD 03/22/15 [phone]

## 2015-03-21 NOTE — ED Notes (Addendum)
Pt reports bp has been elevated recently.  Was supposed to have some dental work today but couldn't do it because bp was so high.  Dentist sent pt to er.  Pt reports history of htn and is supposed to be on medication.  Pt no longer has a doctor so hasn't been taking her medication.  C/O feeling dizzy and headache.

## 2015-04-09 ENCOUNTER — Ambulatory Visit: Payer: Medicaid Other | Admitting: Family Medicine

## 2015-04-16 ENCOUNTER — Telehealth: Payer: Self-pay | Admitting: Internal Medicine

## 2015-04-16 MED ORDER — SUCRALFATE 1 GM/10ML PO SUSP: ORAL | Status: DC

## 2015-04-16 MED ORDER — LIDOCAINE VISCOUS 2 % MT SOLN: OROMUCOSAL | Status: DC

## 2015-04-16 NOTE — Telephone Encounter (Signed)
RX done. She was supposed to see RMR only for the appointment that she missed. Please arrange for her to see RMR.

## 2015-04-16 NOTE — Telephone Encounter (Signed)
Routing to the refill box. 

## 2015-04-16 NOTE — Addendum Note (Signed)
Addended by: Mahala Menghini on: 04/16/2015 10:06 AM   Modules accepted: Orders

## 2015-04-16 NOTE — Telephone Encounter (Signed)
I called patient to cancel OV with [facility] and she is aware of her OV with RMR on 10/25 at 0830

## 2015-04-16 NOTE — Telephone Encounter (Signed)
Patient called to reschedule her missed appointment and is aware of OV on 11/4 at 1030. She also asked if we could call in another prescription of Lidocaine and Carafate to [place].

## 2015-04-16 NOTE — Telephone Encounter (Signed)
Routing to [place]. See LSL note.

## 2015-04-22 ENCOUNTER — Ambulatory Visit: Payer: Medicaid Other | Admitting: Family Medicine

## 2015-04-24 ENCOUNTER — Other Ambulatory Visit: Payer: Self-pay

## 2015-04-29 ENCOUNTER — Ambulatory Visit: Payer: Medicaid Other | Admitting: Internal Medicine

## 2015-04-29 MED ORDER — PROMETHAZINE HCL 25 MG PO TABS: ORAL_TABLET | ORAL | Status: DC

## 2015-05-01 ENCOUNTER — Telehealth: Payer: Self-pay | Admitting: Family Medicine

## 2015-05-01 ENCOUNTER — Ambulatory Visit (INDEPENDENT_AMBULATORY_CARE_PROVIDER_SITE_OTHER): Payer: Medicaid Other | Admitting: Family Medicine

## 2015-05-01 ENCOUNTER — Encounter: Payer: Self-pay | Admitting: Family Medicine

## 2015-05-01 ENCOUNTER — Encounter (INDEPENDENT_AMBULATORY_CARE_PROVIDER_SITE_OTHER): Payer: Self-pay

## 2015-05-01 VITALS — BP 149/84 | Temp 98.3°F | Ht 64.0 in | Wt 227.4 lb

## 2015-05-01 DIAGNOSIS — I10 Essential (primary) hypertension: Secondary | ICD-10-CM | POA: Diagnosis not present

## 2015-05-01 DIAGNOSIS — R011 Cardiac murmur, unspecified: Secondary | ICD-10-CM

## 2015-05-01 DIAGNOSIS — G2581 Restless legs syndrome: Secondary | ICD-10-CM | POA: Diagnosis not present

## 2015-05-01 DIAGNOSIS — F17200 Nicotine dependence, unspecified, uncomplicated: Secondary | ICD-10-CM

## 2015-05-01 DIAGNOSIS — F419 Anxiety disorder, unspecified: Secondary | ICD-10-CM | POA: Diagnosis not present

## 2015-05-01 MED ORDER — LISINOPRIL 10 MG PO TABS: 10.0000 mg | ORAL_TABLET | Freq: Every day | ORAL | Status: DC

## 2015-05-01 MED ORDER — CITALOPRAM HYDROBROMIDE 20 MG PO TABS: 20.0000 mg | ORAL_TABLET | Freq: Every day | ORAL | Status: DC

## 2015-05-01 MED ORDER — ROPINIROLE HCL 0.5 MG PO TABS: ORAL_TABLET | ORAL | Status: DC

## 2015-05-01 MED ORDER — ALBUTEROL SULFATE (2.5 MG/3ML) 0.083% IN NEBU: 2.5000 mg | INHALATION_SOLUTION | Freq: Four times a day (QID) | RESPIRATORY_TRACT | Status: DC | PRN

## 2015-05-01 NOTE — Progress Notes (Signed)
BP 149/84 mmHg  Temp(Src) 98.3 F (36.8 C) (Oral)  Ht _0  (1.626 m)  Wt 227 lb 6.4 oz (103.148 kg)  BMI 39.01 kg/m2   Subjective:    Patient ID: [name], female    DOB: 1956-03-10, 59 y.o.   MRN: [id number]  HPI: [name] is a 59 y.o. female presenting on 05/01/2015 for Establish Care   HPI Hypertension Patient presents today has an new patient. She is here for hypertension check as well and is currently on lisinopril 10 mg for this. Blood pressure today is 149/84 but she insists that she is taking some home readings that have been significantly lower than that. She will monitor her home blood pressure medications and come back for her next visit.  Anxiety Patient also presents today for an anxiety check an says she is having a lot of issues with anxiety and panic attacks and occasional crying. She denies too much depression or not been able to get up and go and do stuff during the day. She denies any difficulty sleeping. She denies suicidal ideation.  Restless legs She's been having difficulty with restless legs, she feels like there are worms crawling around in her legs at night and she has to constantly keep her legs moving to make herself so better. Oftentimes she'll have to get up and out of bed.  Relevant past medical, surgical, family and social history reviewed and updated as indicated. Interim medical history since our last visit reviewed. Allergies and medications reviewed and updated.  Review of Systems  Constitutional: Negative for fever and chills.  HENT: Negative for congestion, ear discharge and ear pain.   Eyes: Negative for redness and visual disturbance.  Respiratory: Negative for chest tightness and shortness of breath.   Cardiovascular: Negative for chest pain and leg swelling.  Genitourinary: Negative for dysuria and difficulty urinating.  Musculoskeletal: Negative for back pain and gait problem.  Skin: Negative for rash.    Neurological: Negative for dizziness, light-headedness and headaches.  Psychiatric/Behavioral: Positive for sleep disturbance (restless leg) and agitation. Negative for suicidal ideas, behavioral problems and self-injury. The patient is nervous/anxious.   All other systems reviewed and are negative.   Per HPI unless specifically indicated above  Social History   Social History  . Marital Status: Married    Spouse Name: N/A  . Number of Children: N/A  . Years of Education: N/A   Occupational History  . disability    Social History Main Topics  . Smoking status: Current Some Day Smoker -- 0.50 packs/day for 42 years    Types: Cigarettes  . Smokeless tobacco: Never Used     Comment: 1/2 pack daily  . Alcohol Use: 0.6 oz/week    1 Standard drinks or equivalent per week     Comment: quit 11/03/2014. used to drink couple of 40 ounces. would do it couple of times per week. denies chronic use.  . Drug Use: No  . Sexual Activity: Yes    Birth Control/ Protection: Post-menopausal   Other Topics Concern  . Not on file   Social History Narrative    Past Surgical History  Procedure Laterality Date  . Kidney stone surgery    . Cholecystectomy    . Hernia repair      Dr. Arnoldo Morale  . Colonoscopy  July 2010    Dr. Arnoldo Morale: 3 rectal polyps, not enough tissue for pathologic examination, recommended surveillance in 3 years  . Esophagogastroduodenoscopy  July 2010  Dr. Arnoldo Morale: gastritis and duodenitis, H.pylori negative  . Colonoscopy N/A 10/23/2014    RMR: Multiple colonic polyps removed as described above. No endoscopic explaniation for abdominal pain. however. next tcs 10/2019  . Esophagogastroduodenoscopy N/A 10/23/2014    RMR: Normal EGD. Status post passage of a Maloney dilator. Today's finding s would not explain abdominal pain  . Maloney dilation N/A 10/23/2014    Procedure: Venia Minks DILATION;  Surgeon: Daneil Dolin, MD;  Location: [facility];  Service: Endoscopy;  Laterality:  N/A;    Family History  Problem Relation Age of Onset  . Adopted: Yes      Medication List       This list is accurate as of: 05/01/15  9:29 AM.  Always use your most recent med list.               acetaminophen 500 MG tablet  Commonly known as:  TYLENOL  Take 500 mg by mouth every 6 (six) hours as needed (pain).     albuterol (2.5 MG/3ML) 0.083% nebulizer solution  Commonly known as:  PROVENTIL  Take 3 mLs (2.5 mg total) by nebulization every 6 (six) hours as needed for wheezing or shortness of breath.     albuterol-ipratropium 18-103 MCG/ACT inhaler  Commonly known as:  COMBIVENT  Inhale 2 puffs into the lungs every 6 (six) hours as needed for shortness of breath.     BREO ELLIPTA 100-25 MCG/INH Aepb  Generic drug:  Fluticasone Furoate-Vilanterol  Inhale 1 puff into the lungs daily.     budesonide-formoterol 160-4.5 MCG/ACT inhaler  Commonly known as:  SYMBICORT  Inhale 2 puffs into the lungs 2 (two) times daily.     citalopram 20 MG tablet  Commonly known as:  CELEXA  Take 1 tablet (20 mg total) by mouth daily.     dexlansoprazole 60 MG capsule  Commonly known as:  DEXILANT  Take 1 capsule (60 mg total) by mouth daily.     estradiol 1 MG tablet  Commonly known as:  ESTRACE  TAKE ONE TABLET BY MOUTH DAILY.     furosemide 20 MG tablet  Commonly known as:  LASIX  Take 1 tablet (20 mg total) by mouth daily.     ibuprofen 800 MG tablet  Commonly known as:  ADVIL,MOTRIN  Take 800 mg by mouth every 6 (six) hours as needed for moderate pain.     lidocaine 2 % solution  Commonly known as:  XYLOCAINE  Take 10cc by mouth three times a day before meals and at bedtime as needed for abdominal pain. No more than 4 doses per day.     lisinopril 10 MG tablet  Commonly known as:  PRINIVIL,ZESTRIL  Take 1 tablet (10 mg total) by mouth daily.     medroxyPROGESTERone 2.5 MG tablet  Commonly known as:  PROVERA  TAKE ONE TABLET BY MOUTH DAILY.     promethazine 25 MG  tablet  Commonly known as:  PHENERGAN  TAKE (1) TABLET BY MOUTH EVERY SIX HOURS AS NEEDED.     sucralfate 1 GM/10ML suspension  Commonly known as:  CARAFATE  TAKE 10MLs BY MOUTH FOUR TIMES DAILY BEFORE MEALS AND AT BEDTIME as needed     theophylline 200 MG 24 hr capsule  Commonly known as:  THEO-24  Take 200 mg by mouth daily.           Objective:    BP 149/84 mmHg  Temp(Src) 98.3 F (36.8 C) (Oral)  Ht 5'  4" (1.626 m)  Wt 227 lb 6.4 oz (103.148 kg)  BMI 39.01 kg/m2  Wt Readings from Last 3 Encounters:  05/01/15 227 lb 6.4 oz (103.148 kg)  03/21/15 220 lb (99.791 kg)  12/04/14 223 lb (101.152 kg)    Physical Exam  Constitutional: She is oriented to person, place, and time. She appears well-developed and well-nourished. No distress.  Eyes: Conjunctivae and EOM are normal. Pupils are equal, round, and reactive to light.  Neck: Neck supple. No thyromegaly present.  Cardiovascular: Normal rate, regular rhythm and intact distal pulses.   Murmur (systolic murmur) heard. Pulmonary/Chest: Effort normal and breath sounds normal. No respiratory distress. She has no wheezes.  Musculoskeletal: Normal range of motion. She exhibits no edema or tenderness.  Lymphadenopathy:    She has no cervical adenopathy.  Neurological: She is alert and oriented to person, place, and time. Coordination normal.  Skin: Skin is warm and dry. No rash noted. She is not diaphoretic.  Psychiatric: Her behavior is normal. Her mood appears anxious. She exhibits a depressed mood.  Vitals reviewed.   Results for orders placed or performed during the hospital encounter of 16/10/96  Basic metabolic panel  Result Value Ref Range   Sodium 139 135 - 145 mmol/L   Potassium 3.2 (L) 3.5 - 5.1 mmol/L   Chloride 103 101 - 111 mmol/L   CO2 28 22 - 32 mmol/L   Glucose, Bld 111 (H) 65 - 99 mg/dL   BUN 13 6 - 20 mg/dL   Creatinine, Ser 0.58 0.44 - 1.00 mg/dL   Calcium 9.8 8.9 - 10.3 mg/dL   GFR calc non Af Amer  >60 >60 mL/min   GFR calc Af Amer >60 >60 mL/min   Anion gap 8 5 - 15      Assessment & Plan:   Problem List Items Addressed This Visit      Cardiovascular and Mediastinum   Hypertension - Primary (Chronic)   Relevant Medications   lisinopril (PRINIVIL,ZESTRIL) 10 MG tablet   Other Relevant Orders   Lipid panel   CMP14+EGFR     Other   Anxiety (Chronic)   Relevant Medications   citalopram (CELEXA) 20 MG tablet   Other Relevant Orders   TSH   Vit D  25 hydroxy (rtn osteoporosis monitoring)   CBC with Differential/Platelet   Tobacco use disorder    Other Visit Diagnoses    Systolic murmur        Relevant Orders    Echocardiogram    Restless legs syndrome        Relevant Medications    rOPINIRole (REQUIP) 0.5 MG tablet        Follow up plan: Return in about 4 weeks (around 05/29/2015), or if symptoms worsen or fail to improve, for Anxiety/depression.  Caryl Pina, MD [facility] 05/01/2015, 9:29 AM

## 2015-05-01 NOTE — Telephone Encounter (Signed)
Sent requip

## 2015-05-09 ENCOUNTER — Ambulatory Visit: Payer: Medicaid Other | Admitting: Gastroenterology

## 2015-05-22 ENCOUNTER — Telehealth: Payer: Self-pay | Admitting: Family Medicine

## 2015-05-22 MED ORDER — LISINOPRIL 10 MG PO TABS: 10.0000 mg | ORAL_TABLET | Freq: Every day | ORAL | Status: DC

## 2015-05-22 NOTE — Telephone Encounter (Signed)
done

## 2015-05-27 ENCOUNTER — Encounter: Payer: Self-pay | Admitting: Internal Medicine

## 2015-05-27 ENCOUNTER — Telehealth: Payer: Self-pay | Admitting: Internal Medicine

## 2015-05-27 ENCOUNTER — Ambulatory Visit: Payer: Medicaid Other | Admitting: Internal Medicine

## 2015-05-27 NOTE — Telephone Encounter (Signed)
PATIENT WAS A NO SHOW AND LETTER SENT  °

## 2015-05-27 NOTE — Telephone Encounter (Signed)
noted 

## 2015-06-02 ENCOUNTER — Other Ambulatory Visit: Payer: Self-pay | Admitting: Family Medicine

## 2015-06-02 ENCOUNTER — Telehealth: Payer: Self-pay | Admitting: Family Medicine

## 2015-06-02 ENCOUNTER — Ambulatory Visit: Payer: Medicaid Other | Admitting: Family Medicine

## 2015-06-02 DIAGNOSIS — G2581 Restless legs syndrome: Secondary | ICD-10-CM

## 2015-06-02 MED ORDER — ROPINIROLE HCL 1 MG PO TABS: 1.0000 mg | ORAL_TABLET | Freq: Three times a day (TID) | ORAL | Status: DC

## 2015-06-02 MED ORDER — ROPINIROLE HCL 0.5 MG PO TABS: ORAL_TABLET | ORAL | Status: DC

## 2015-06-02 NOTE — Telephone Encounter (Signed)
Pharmacy called and wanted to know if on her requip if she was still triturating or if we can chang to requip '1mg'$  Tid. I spoke with Dr. Warrick Parisian and we do not need to titrate and rx changed to requip '1mg'$  TID.

## 2015-06-17 ENCOUNTER — Telehealth: Payer: Self-pay | Admitting: Family Medicine

## 2015-06-17 MED ORDER — ROPINIROLE HCL 1 MG PO TABS: 1.0000 mg | ORAL_TABLET | Freq: Three times a day (TID) | ORAL | Status: DC

## 2015-06-17 NOTE — Telephone Encounter (Signed)
done

## 2015-06-18 ENCOUNTER — Ambulatory Visit: Payer: Medicaid Other | Admitting: Family Medicine

## 2015-07-02 ENCOUNTER — Ambulatory Visit: Payer: Medicaid Other | Admitting: Family Medicine

## 2015-07-03 ENCOUNTER — Ambulatory Visit: Payer: Medicaid Other | Admitting: Family Medicine

## 2015-07-07 ENCOUNTER — Other Ambulatory Visit: Payer: Self-pay | Admitting: Cardiology

## 2015-07-08 ENCOUNTER — Other Ambulatory Visit: Payer: Self-pay

## 2015-07-08 MED ORDER — PROMETHAZINE HCL 25 MG PO TABS: ORAL_TABLET | ORAL | Status: DC

## 2015-07-18 ENCOUNTER — Ambulatory Visit (INDEPENDENT_AMBULATORY_CARE_PROVIDER_SITE_OTHER): Payer: Medicaid Other | Admitting: Family Medicine

## 2015-07-18 ENCOUNTER — Encounter: Payer: Self-pay | Admitting: Family Medicine

## 2015-07-18 VITALS — BP 128/82 | HR 86 | Temp 97.3°F | Ht 64.0 in | Wt 220.4 lb

## 2015-07-18 DIAGNOSIS — Z13220 Encounter for screening for lipoid disorders: Secondary | ICD-10-CM

## 2015-07-18 DIAGNOSIS — Z131 Encounter for screening for diabetes mellitus: Secondary | ICD-10-CM

## 2015-07-18 DIAGNOSIS — F419 Anxiety disorder, unspecified: Secondary | ICD-10-CM | POA: Diagnosis not present

## 2015-07-18 DIAGNOSIS — G2581 Restless legs syndrome: Secondary | ICD-10-CM | POA: Diagnosis not present

## 2015-07-18 DIAGNOSIS — J019 Acute sinusitis, unspecified: Secondary | ICD-10-CM

## 2015-07-18 MED ORDER — FLUTICASONE PROPIONATE 50 MCG/ACT NA SUSP: 1.0000 | Freq: Two times a day (BID) | NASAL | Status: DC | PRN

## 2015-07-18 MED ORDER — VENLAFAXINE HCL ER 75 MG PO CP24: 75.0000 mg | ORAL_CAPSULE | Freq: Every day | ORAL | Status: DC

## 2015-07-18 MED ORDER — ROPINIROLE HCL 1 MG PO TABS: 1.0000 mg | ORAL_TABLET | Freq: Every day | ORAL | Status: DC

## 2015-07-18 NOTE — Progress Notes (Signed)
BP 128/82 mmHg  Pulse 86  Temp(Src) 97.3 F (36.3 C) (Oral)  Ht _0  (1.626 m)  Wt 220 lb 6.4 oz (99.973 kg)  BMI 37.81 kg/m2   Subjective:    Patient ID: [name], female    DOB: [birth year], 60 y.o.   MRN: [id number]  HPI: [name] is a 60 y.o. female presenting on 07/18/2015 for Depression, anxiety followup; Look at skin lesions; and Left ear   HPI Anxiety recheck Patient is coming in for an anxiety recheck today. She was given citalopram 20 mg 4 weeks ago and she took it for about 3 or 4 days and said that it wasn't working and she had a panic attack during that time and she felt like the medication caused it and stopped it and threw away. She feels like she is still having very significant issues with anxiety and would like to try something else. She denies any suicidal ideations. She is still having issues with sleep but that has improved with the Requip. The Requip is helping her with her restless legs so that she can sleep a little bit better.  Restless leg syndrome Patient feels like the Requip is helping and would like to continue it.  Sinus congestion Patient has been having sinus congestion for the past couple days, she has all of her inhalers that she typically uses for his COPD. She does not feel like she is having wheezing or coughing anything significant along those lines she is just having a little bit of nasal drainage and sinus pressure like something to help with that. She denies any fevers or chills.  Relevant past medical, surgical, family and social history reviewed and updated as indicated. Interim medical history since our last visit reviewed. Allergies and medications reviewed and updated.  Review of Systems  Constitutional: Negative for fever and chills.  HENT: Positive for postnasal drip, rhinorrhea, sinus pressure and sneezing. Negative for congestion, ear discharge, ear pain and sore throat.   Eyes: Negative for pain, redness  and visual disturbance.  Respiratory: Negative for cough, chest tightness and shortness of breath.   Cardiovascular: Negative for chest pain and leg swelling.  Genitourinary: Negative for dysuria and difficulty urinating.  Musculoskeletal: Negative for back pain and gait problem.  Skin: Negative for rash.  Neurological: Negative for light-headedness and headaches.  Psychiatric/Behavioral: Positive for sleep disturbance. Negative for suicidal ideas, behavioral problems, self-injury and agitation. The patient is nervous/anxious.   All other systems reviewed and are negative.   Per HPI unless specifically indicated above     Medication List       This list is accurate as of: 07/18/15 11:06 AM.  Always use your most recent med list.               acetaminophen 500 MG tablet  Commonly known as:  TYLENOL  Take 500 mg by mouth every 6 (six) hours as needed (pain).     albuterol (2.5 MG/3ML) 0.083% nebulizer solution  Commonly known as:  PROVENTIL  Take 3 mLs (2.5 mg total) by nebulization every 6 (six) hours as needed for wheezing or shortness of breath.     albuterol-ipratropium 18-103 MCG/ACT inhaler  Commonly known as:  COMBIVENT  Inhale 2 puffs into the lungs every 6 (six) hours as needed for shortness of breath.     BREO ELLIPTA 100-25 MCG/INH Aepb  Generic drug:  Fluticasone Furoate-Vilanterol  Inhale 1 puff into the lungs daily.  budesonide-formoterol 160-4.5 MCG/ACT inhaler  Commonly known as:  SYMBICORT  Inhale 2 puffs into the lungs 2 (two) times daily.     dexlansoprazole 60 MG capsule  Commonly known as:  DEXILANT  Take 1 capsule (60 mg total) by mouth daily.     estradiol 1 MG tablet  Commonly known as:  ESTRACE  TAKE ONE TABLET BY MOUTH DAILY.     fluticasone 50 MCG/ACT nasal spray  Commonly known as:  FLONASE  Place 1 spray into both nostrils 2 (two) times daily as needed for allergies or rhinitis.     furosemide 20 MG tablet  Commonly known as:   LASIX  Take 1 tablet (20 mg total) by mouth daily.     ibuprofen 800 MG tablet  Commonly known as:  ADVIL,MOTRIN  Take 800 mg by mouth every 6 (six) hours as needed for moderate pain.     lidocaine 2 % solution  Commonly known as:  XYLOCAINE  Take 10cc by mouth three times a day before meals and at bedtime as needed for abdominal pain. No more than 4 doses per day.     lisinopril 10 MG tablet  Commonly known as:  PRINIVIL,ZESTRIL  Take 1 tablet (10 mg total) by mouth daily.     medroxyPROGESTERone 2.5 MG tablet  Commonly known as:  PROVERA  TAKE ONE TABLET BY MOUTH DAILY.     promethazine 25 MG tablet  Commonly known as:  PHENERGAN  TAKE (1) TABLET BY MOUTH EVERY SIX HOURS AS NEEDED.     rOPINIRole 1 MG tablet  Commonly known as:  REQUIP  Take 1 tablet (1 mg total) by mouth at bedtime.     sucralfate 1 GM/10ML suspension  Commonly known as:  CARAFATE  TAKE 10MLs BY MOUTH FOUR TIMES DAILY BEFORE MEALS AND AT BEDTIME as needed     theophylline 200 MG 24 hr capsule  Commonly known as:  THEO-24  Take 200 mg by mouth daily.     venlafaxine XR 75 MG 24 hr capsule  Commonly known as:  EFFEXOR XR  Take 1 capsule (75 mg total) by mouth daily with breakfast.           Objective:    BP 128/82 mmHg  Pulse 86  Temp(Src) 97.3 F (36.3 C) (Oral)  Ht _0  (1.626 m)  Wt 220 lb 6.4 oz (99.973 kg)  BMI 37.81 kg/m2  Wt Readings from Last 3 Encounters:  07/18/15 220 lb 6.4 oz (99.973 kg)  05/01/15 227 lb 6.4 oz (103.148 kg)  03/21/15 220 lb (99.791 kg)    Physical Exam  Constitutional: She is oriented to person, place, and time. She appears well-developed and well-nourished. No distress.  HENT:  Right Ear: Tympanic membrane, external ear and ear canal normal.  Left Ear: Tympanic membrane, external ear and ear canal normal.  Nose: Mucosal edema and rhinorrhea present. No epistaxis. Right sinus exhibits no maxillary sinus tenderness and no frontal sinus tenderness. Left sinus  exhibits no maxillary sinus tenderness and no frontal sinus tenderness.  Mouth/Throat: Uvula is midline and mucous membranes are normal. Posterior oropharyngeal edema and posterior oropharyngeal erythema present. No oropharyngeal exudate or tonsillar abscesses.  Eyes: Conjunctivae and EOM are normal.  Neck: Neck supple. No thyromegaly present.  Cardiovascular: Normal rate, regular rhythm, normal heart sounds and intact distal pulses.   No murmur heard. Pulmonary/Chest: Effort normal and breath sounds normal. No respiratory distress. She has no wheezes.  Musculoskeletal: Normal range of motion. She  exhibits no edema or tenderness.  Lymphadenopathy:    She has no cervical adenopathy.  Neurological: She is alert and oriented to person, place, and time. Coordination normal.  Skin: Skin is warm and dry. No rash noted. She is not diaphoretic.  Psychiatric: Her speech is normal and behavior is normal. Judgment and thought content normal. Her mood appears anxious. Her affect is blunt. Her affect is not labile. She expresses no suicidal ideation. She expresses no suicidal plans.  Vitals reviewed.   Results for orders placed or performed during the hospital encounter of 88/50/27  Basic metabolic panel  Result Value Ref Range   Sodium 139 135 - 145 mmol/L   Potassium 3.2 (L) 3.5 - 5.1 mmol/L   Chloride 103 101 - 111 mmol/L   CO2 28 22 - 32 mmol/L   Glucose, Bld 111 (H) 65 - 99 mg/dL   BUN 13 6 - 20 mg/dL   Creatinine, Ser 0.58 0.44 - 1.00 mg/dL   Calcium 9.8 8.9 - 10.3 mg/dL   GFR calc non Af Amer >60 >60 mL/min   GFR calc Af Amer >60 >60 mL/min   Anion gap 8 5 - 15      Assessment & Plan:   Problem List Items Addressed This Visit      Other   Anxiety - Primary (Chronic)    Feels like current medication, the Celexa is not doing anything and would rather switch then try and increase the dose. She also only tried the Celexa for a few days because she felt like she had more anxiety from it.  We will try Effexor.      Relevant Medications   venlafaxine XR (EFFEXOR XR) 75 MG 24 hr capsule   Other Relevant Orders   TSH (Completed)   CBC with Differential/Platelet (Completed)    Other Visit Diagnoses    Restless leg syndrome, controlled        Refill Requip, been helping her.    Relevant Medications    rOPINIRole (REQUIP) 1 MG tablet    Acute rhinosinusitis        Mild nasal congestion symptoms, try Flonase and an antihistamine    Relevant Medications    fluticasone (FLONASE) 50 MCG/ACT nasal spray    Screening for diabetes mellitus        Relevant Orders    CMP14+EGFR (Completed)    Screening for lipid disorders        Relevant Orders    Lipid panel (Completed)        Follow up plan: Return in about 4 weeks (around 08/15/2015), or if symptoms worsen or fail to improve, for Follow-up anxiety and depression.  Counseling provided for all of the vaccine components No orders of the defined types were placed in this encounter.    Caryl Pina, MD [facility] 07/18/2015, 11:06 AM

## 2015-07-19 LAB — LIPID PANEL
CHOLESTEROL TOTAL: 197 mg/dL (ref 100–199)
Chol/HDL Ratio: 4.8 ratio units — ABNORMAL HIGH (ref 0.0–4.4)
HDL: 41 mg/dL (ref >39)
LDL CALC: 127 mg/dL — AB (ref 0–99)
TRIGLYCERIDES: 144 mg/dL (ref 0–149)
VLDL CHOLESTEROL CAL: 29 mg/dL (ref 5–40)

## 2015-07-19 LAB — CMP14+EGFR
A/G RATIO: 1.6 (ref 1.1–2.5)
ALBUMIN: 4.1 g/dL (ref 3.6–4.8)
ALK PHOS: 78 IU/L (ref 39–117)
ALT: 19 IU/L (ref 0–32)
AST: 18 IU/L (ref 0–40)
BILIRUBIN TOTAL: 0.4 mg/dL (ref 0.0–1.2)
BUN / CREAT RATIO: 14 (ref 11–26)
BUN: 11 mg/dL (ref 8–27)
CHLORIDE: 99 mmol/L (ref 96–106)
CO2: 21 mmol/L (ref 18–29)
Calcium: 10.1 mg/dL (ref 8.7–10.3)
Creatinine, Ser: 0.8 mg/dL (ref 0.57–1.00)
GFR calc Af Amer: 93 mL/min/{1.73_m2} (ref >59)
GFR calc non Af Amer: 80 mL/min/{1.73_m2} (ref >59)
GLOBULIN, TOTAL: 2.5 g/dL (ref 1.5–4.5)
Glucose: 114 mg/dL — ABNORMAL HIGH (ref 65–99)
POTASSIUM: 4.3 mmol/L (ref 3.5–5.2)
Sodium: 140 mmol/L (ref 134–144)
Total Protein: 6.6 g/dL (ref 6.0–8.5)

## 2015-07-19 LAB — CBC WITH DIFFERENTIAL/PLATELET
BASOS ABS: 0 10*3/uL (ref 0.0–0.2)
Basos: 0 %
EOS (ABSOLUTE): 0.2 10*3/uL (ref 0.0–0.4)
Eos: 3 %
Hematocrit: 44.8 % (ref 34.0–46.6)
Hemoglobin: 15.7 g/dL (ref 11.1–15.9)
IMMATURE GRANULOCYTES: 0 %
Immature Grans (Abs): 0 10*3/uL (ref 0.0–0.1)
LYMPHS: 24 %
Lymphocytes Absolute: 1.6 10*3/uL (ref 0.7–3.1)
MCH: 33.7 pg — ABNORMAL HIGH (ref 26.6–33.0)
MCHC: 35 g/dL (ref 31.5–35.7)
MCV: 96 fL (ref 79–97)
MONOS ABS: 0.7 10*3/uL (ref 0.1–0.9)
Monocytes: 11 %
NEUTROS PCT: 62 %
Neutrophils Absolute: 4.2 10*3/uL (ref 1.4–7.0)
PLATELETS: 250 10*3/uL (ref 150–379)
RBC: 4.66 x10E6/uL (ref 3.77–5.28)
RDW: 14.5 % (ref 12.3–15.4)
WBC: 6.7 10*3/uL (ref 3.4–10.8)

## 2015-07-19 LAB — TSH: TSH: 1.38 u[IU]/mL (ref 0.450–4.500)

## 2015-07-19 NOTE — Assessment & Plan Note (Signed)
Feels like current medication, the Celexa is not doing anything and would rather switch then try and increase the dose. She also only tried the Celexa for a few days because she felt like she had more anxiety from it. We will try Effexor.

## 2015-07-21 ENCOUNTER — Telehealth: Payer: Self-pay | Admitting: Family Medicine

## 2015-07-28 ENCOUNTER — Other Ambulatory Visit: Payer: Self-pay | Admitting: Gastroenterology

## 2015-07-28 ENCOUNTER — Other Ambulatory Visit: Payer: Self-pay | Admitting: Family Medicine

## 2015-07-28 ENCOUNTER — Other Ambulatory Visit: Payer: Self-pay | Admitting: Nurse Practitioner

## 2015-07-28 DIAGNOSIS — Z1231 Encounter for screening mammogram for malignant neoplasm of breast: Secondary | ICD-10-CM

## 2015-07-31 ENCOUNTER — Ambulatory Visit (HOSPITAL_COMMUNITY)
Admission: RE | Admit: 2015-07-31 | Discharge: 2015-07-31 | Disposition: A | Discharge status: Home / Self Care | Payer: Medicaid Other | Source: Ambulatory Visit | Attending: Family Medicine | Admitting: Family Medicine

## 2015-07-31 DIAGNOSIS — Z1231 Encounter for screening mammogram for malignant neoplasm of breast: Secondary | ICD-10-CM | POA: Insufficient documentation

## 2015-08-01 ENCOUNTER — Encounter: Payer: Self-pay | Admitting: Nurse Practitioner

## 2015-08-01 ENCOUNTER — Ambulatory Visit (INDEPENDENT_AMBULATORY_CARE_PROVIDER_SITE_OTHER): Payer: Medicaid Other | Admitting: Nurse Practitioner

## 2015-08-01 VITALS — BP 140/76 | HR 72 | Temp 96.6°F | Ht 64.0 in | Wt 227.0 lb

## 2015-08-01 DIAGNOSIS — F319 Bipolar disorder, unspecified: Secondary | ICD-10-CM | POA: Diagnosis not present

## 2015-08-01 DIAGNOSIS — R3915 Urgency of urination: Secondary | ICD-10-CM

## 2015-08-01 DIAGNOSIS — F32A Depression, unspecified: Secondary | ICD-10-CM

## 2015-08-01 DIAGNOSIS — R609 Edema, unspecified: Secondary | ICD-10-CM | POA: Diagnosis not present

## 2015-08-01 DIAGNOSIS — R600 Localized edema: Secondary | ICD-10-CM

## 2015-08-01 DIAGNOSIS — F419 Anxiety disorder, unspecified: Secondary | ICD-10-CM | POA: Diagnosis not present

## 2015-08-01 DIAGNOSIS — F329 Major depressive disorder, single episode, unspecified: Secondary | ICD-10-CM

## 2015-08-01 DIAGNOSIS — F410 Panic disorder [episodic paroxysmal anxiety] without agoraphobia: Secondary | ICD-10-CM

## 2015-08-01 DIAGNOSIS — F1010 Alcohol abuse, uncomplicated: Secondary | ICD-10-CM | POA: Diagnosis not present

## 2015-08-01 LAB — POCT UA - MICROSCOPIC ONLY
Casts, Ur, LPF, POC: NEGATIVE
Crystals, Ur, HPF, POC: NEGATIVE
Mucus, UA: NEGATIVE
YEAST UA: NEGATIVE

## 2015-08-01 LAB — POCT URINALYSIS DIPSTICK
BILIRUBIN UA: NEGATIVE
GLUCOSE UA: NEGATIVE
Ketones, UA: NEGATIVE
Leukocytes, UA: NEGATIVE
NITRITE UA: NEGATIVE
PH UA: 5
PROTEIN UA: NEGATIVE
RBC UA: NEGATIVE
Spec Grav, UA: 1.01
UROBILINOGEN UA: NEGATIVE

## 2015-08-01 MED ORDER — FUROSEMIDE 40 MG PO TABS: 40.0000 mg | ORAL_TABLET | Freq: Every day | ORAL | Status: DC

## 2015-08-01 NOTE — Progress Notes (Addendum)
   Subjective:    Patient ID: [name], female    DOB: Mar 27, 1956, 60 y.o.   MRN: [id number]  HPI Patient is here today for multiple problems; - depression- was seen by Dr. Warrick Parisian a month ago and was started on effexor bur she did not take it at all- she then wen to [facility] about 1 weeks ago and they started her on prozac. - GAD/panic attacks- says the she has been on anxiety meds for years - xanax in the past and requested some form Dr. Warrick Parisian and he refused to give her any. S[facility] and [facility]. Her last drink of alcohol was July 09, 2015.  Bipolar disorder- dx many years ago- has a long history of mental disorder- currently on no specific meds for this. - peripheral edema- is on lasix but not helping- some SOB  * C/O urinary urgency with trouble starting flow with slight lower back pain  * Patient also has history of cocaine abuse    Review of Systems  Constitutional: Negative.   HENT: Negative.   Respiratory: Negative.   Cardiovascular: Negative.   Genitourinary: Negative.   Neurological: Negative.   Psychiatric/Behavioral: Negative.   All other systems reviewed and are negative.      Objective:   Physical Exam  Constitutional: She is oriented to person, place, and time. She appears well-developed and well-nourished.  Cardiovascular: Normal rate and normal heart sounds.   Pulmonary/Chest: Effort normal.  Neurological: She is alert and oriented to person, place, and time.  Skin: Skin is warm and dry.  Psychiatric: She has a normal mood and affect. Her behavior is normal. Judgment and thought content normal.  Good eye contact- answers all questions appropriately    BP 140/76 mmHg  Pulse 72  Temp(Src) 96.6 F (35.9 C) (Oral)  Ht '5\' 4"'$  (1.626 m)  Wt 227 lb (102.967 kg)  BMI 38.95 kg/m2  SpO2 98%        Assessment & Plan:   1.  Alcohol abuse   2. Panic disorder   3. Anxiety   4. Bipolar 1 disorder ([place])   5. Depression    Continue prozac refused to give  Benzo Encouraged to go to substance abuse half [facility] house meds list discussed and reviewed RTO in 1 month   6. Peripheral edema Elevate legs when sitting Increased lasix from 20 mg to 40 mg daily  7. Urinary urgency - urine clear Force fluids  Mary-Margaret Hassell Done, FNP

## 2015-08-01 NOTE — Addendum Note (Signed)
Addended by: Chevis Pretty on: 08/01/2015 10:56 AM   Modules accepted: Orders

## 2015-08-01 NOTE — Patient Instructions (Signed)
Stress and Stress Management Stress is a normal reaction to life events. It is what you feel when life demands more than you are used to or more than you can handle. Some stress can be useful. For example, the stress reaction can help you catch the last bus of the day, study for a test, or meet a deadline at work. But stress that occurs too often or for too long can cause problems. It can affect your emotional health and interfere with relationships and normal daily activities. Too much stress can weaken your immune system and increase your risk for physical illness. If you already have a medical problem, stress can make it worse. CAUSES  All sorts of life events may cause stress. An event that causes stress for one person may not be stressful for another person. Major life events commonly cause stress. These may be positive or negative. Examples include losing your job, moving into a new home, getting married, having a baby, or losing a loved one. Less obvious life events may also cause stress, especially if they occur day after day or in combination. Examples include working long hours, driving in traffic, caring for children, being in debt, or being in a difficult relationship. SIGNS AND SYMPTOMS Stress may cause emotional symptoms including, the following:  Anxiety. This is feeling worried, afraid, on edge, overwhelmed, or out of control.  Anger. This is feeling irritated or impatient.  Depression. This is feeling sad, down, helpless, or guilty.  Difficulty focusing, remembering, or making decisions. Stress may cause physical symptoms, including the following:   Aches and pains. These may affect your head, neck, back, stomach, or other areas of your body.  Tight muscles or clenched jaw.  Low energy or trouble sleeping. Stress may cause unhealthy behaviors, including the following:   Eating to feel better (overeating) or skipping meals.  Sleeping too little, too much, or both.  Working  too much or putting off tasks (procrastination).  Smoking, drinking alcohol, or using drugs to feel better. DIAGNOSIS  Stress is diagnosed through an assessment by your health care provider. Your health care provider will ask questions about your symptoms and any stressful life events.Your health care provider will also ask about your medical history and may order blood tests or other tests. Certain medical conditions and medicine can cause physical symptoms similar to stress. Mental illness can cause emotional symptoms and unhealthy behaviors similar to stress. Your health care provider may refer you to a mental health professional for further evaluation.  TREATMENT  Stress management is the recommended treatment for stress.The goals of stress management are reducing stressful life events and coping with stress in healthy ways.  Techniques for reducing stressful life events include the following:  Stress identification. Self-monitor for stress and identify what causes stress for you. These skills may help you to avoid some stressful events.  Time management. Set your priorities, keep a calendar of events, and learn to say "no." These tools can help you avoid making too many commitments. Techniques for coping with stress include the following:  Rethinking the problem. Try to think realistically about stressful events rather than ignoring them or overreacting. Try to find the positives in a stressful situation rather than focusing on the negatives.  Exercise. Physical exercise can release both physical and emotional tension. The key is to find a form of exercise you enjoy and do it regularly.  Relaxation techniques. These relax the body and mind. Examples include yoga, meditation, tai chi, biofeedback, deep  breathing, progressive muscle relaxation, listening to music, being out in nature, journaling, and other hobbies. Again, the key is to find one or more that you enjoy and can do  regularly.  Healthy lifestyle. Eat a balanced diet, get plenty of sleep, and do not smoke. Avoid using alcohol or drugs to relax.  Strong support network. Spend time with family, friends, or other people you enjoy being around.Express your feelings and talk things over with someone you trust. Counseling or talktherapy with a mental health professional may be helpful if you are having difficulty managing stress on your own. Medicine is typically not recommended for the treatment of stress.Talk to your health care provider if you think you need medicine for symptoms of stress. HOME CARE INSTRUCTIONS  Keep all follow-up visits as directed by your health care provider.  Take all medicines as directed by your health care provider. SEEK MEDICAL CARE IF:  Your symptoms get worse or you start having new symptoms.  You feel overwhelmed by your problems and can no longer manage them on your own. SEEK IMMEDIATE MEDICAL CARE IF:  You feel like hurting yourself or someone else.   This information is not intended to replace advice given to you by your health care provider. Make sure you discuss any questions you have with your health care provider.   Document Released: 12/15/2000 Document Revised: 07/12/2014 Document Reviewed: 02/13/2013 [employer] Interactive Patient Education 2016 [employer]  

## 2015-08-02 ENCOUNTER — Emergency Department (HOSPITAL_COMMUNITY)
Admission: EM | Admit: 2015-08-02 | Discharge: 2015-08-02 | Disposition: A | Discharge status: Home / Self Care | Payer: Medicaid Other | Attending: Emergency Medicine | Admitting: Emergency Medicine

## 2015-08-02 ENCOUNTER — Encounter (HOSPITAL_COMMUNITY): Payer: Self-pay | Admitting: Emergency Medicine

## 2015-08-02 ENCOUNTER — Emergency Department (HOSPITAL_COMMUNITY): Payer: Medicaid Other

## 2015-08-02 DIAGNOSIS — J441 Chronic obstructive pulmonary disease with (acute) exacerbation: Secondary | ICD-10-CM | POA: Insufficient documentation

## 2015-08-02 DIAGNOSIS — F17210 Nicotine dependence, cigarettes, uncomplicated: Secondary | ICD-10-CM | POA: Insufficient documentation

## 2015-08-02 DIAGNOSIS — R0602 Shortness of breath: Secondary | ICD-10-CM | POA: Diagnosis present

## 2015-08-02 DIAGNOSIS — R938 Abnormal findings on diagnostic imaging of other specified body structures: Secondary | ICD-10-CM | POA: Insufficient documentation

## 2015-08-02 DIAGNOSIS — I509 Heart failure, unspecified: Secondary | ICD-10-CM | POA: Insufficient documentation

## 2015-08-02 DIAGNOSIS — Z79899 Other long term (current) drug therapy: Secondary | ICD-10-CM | POA: Diagnosis not present

## 2015-08-02 DIAGNOSIS — I10 Essential (primary) hypertension: Secondary | ICD-10-CM | POA: Diagnosis not present

## 2015-08-02 DIAGNOSIS — R600 Localized edema: Secondary | ICD-10-CM | POA: Insufficient documentation

## 2015-08-02 DIAGNOSIS — F419 Anxiety disorder, unspecified: Secondary | ICD-10-CM | POA: Insufficient documentation

## 2015-08-02 DIAGNOSIS — Z880 Allergy status to penicillin: Secondary | ICD-10-CM | POA: Insufficient documentation

## 2015-08-02 DIAGNOSIS — F319 Bipolar disorder, unspecified: Secondary | ICD-10-CM | POA: Diagnosis not present

## 2015-08-02 DIAGNOSIS — R9389 Abnormal findings on diagnostic imaging of other specified body structures: Secondary | ICD-10-CM

## 2015-08-02 DIAGNOSIS — Z7951 Long term (current) use of inhaled steroids: Secondary | ICD-10-CM | POA: Diagnosis not present

## 2015-08-02 DIAGNOSIS — Z8639 Personal history of other endocrine, nutritional and metabolic disease: Secondary | ICD-10-CM | POA: Diagnosis not present

## 2015-08-02 LAB — CBC WITH DIFFERENTIAL/PLATELET
Basophils Absolute: 0 10*3/uL (ref 0.0–0.1)
Basophils Relative: 0 %
EOS ABS: 0.3 10*3/uL (ref 0.0–0.7)
EOS PCT: 3 %
HCT: 40.9 % (ref 36.0–46.0)
Hemoglobin: 14.2 g/dL (ref 12.0–15.0)
LYMPHS ABS: 2.8 10*3/uL (ref 0.7–4.0)
Lymphocytes Relative: 29 %
MCH: 33.3 pg (ref 26.0–34.0)
MCHC: 34.7 g/dL (ref 30.0–36.0)
MCV: 95.8 fL (ref 78.0–100.0)
MONOS PCT: 10 %
Monocytes Absolute: 1 10*3/uL (ref 0.1–1.0)
Neutro Abs: 5.5 10*3/uL (ref 1.7–7.7)
Neutrophils Relative %: 58 %
PLATELETS: 270 10*3/uL (ref 150–400)
RBC: 4.27 MIL/uL (ref 3.87–5.11)
RDW: 14 % (ref 11.5–15.5)
WBC: 9.6 10*3/uL (ref 4.0–10.5)

## 2015-08-02 LAB — BASIC METABOLIC PANEL
Anion gap: 7 (ref 5–15)
BUN: 14 mg/dL (ref 6–20)
CHLORIDE: 101 mmol/L (ref 101–111)
CO2: 30 mmol/L (ref 22–32)
CREATININE: 0.71 mg/dL (ref 0.44–1.00)
Calcium: 9.8 mg/dL (ref 8.9–10.3)
GFR calc Af Amer: >60 mL/min (ref >60)
GFR calc non Af Amer: >60 mL/min (ref >60)
GLUCOSE: 101 mg/dL — AB (ref 65–99)
Potassium: 3.6 mmol/L (ref 3.5–5.1)
Sodium: 138 mmol/L (ref 135–145)

## 2015-08-02 LAB — I-STAT TROPONIN, ED: Troponin i, poc: 0 ng/mL (ref 0.00–0.08)

## 2015-08-02 LAB — BRAIN NATRIURETIC PEPTIDE: B NATRIURETIC PEPTIDE 5: 83 pg/mL (ref 0.0–100.0)

## 2015-08-02 MED ORDER — AZITHROMYCIN 250 MG PO TABS: ORAL_TABLET | ORAL | Status: DC

## 2015-08-02 MED ORDER — AZITHROMYCIN 250 MG PO TABS
500.0000 mg | ORAL_TABLET | Freq: Once | ORAL | Status: AC
  Administered 2015-08-02: 500 mg via ORAL
  Filled 2015-08-02: qty 2

## 2015-08-02 MED ORDER — ACETAMINOPHEN 325 MG PO TABS
650.0000 mg | ORAL_TABLET | Freq: Once | ORAL | Status: AC
  Administered 2015-08-02: 650 mg via ORAL
  Filled 2015-08-02: qty 2

## 2015-08-02 NOTE — ED Provider Notes (Signed)
CSN: [id number]     Arrival date & time 08/02/15  1338 History   First MD Initiated Contact with Patient 08/02/15 1349     Chief Complaint  Patient presents with  . Shortness of Breath     HPI Pt reports increasingly worse SOB and cough x 5 days. States she also has edema to abdomen and bilateral lower extremities. Pt has hx of CHF and COPD. Pt reports taking '40mg'$  lasix PTA. Past Medical History  Diagnosis Date  . COPD (chronic obstructive pulmonary disease) ([place])   . Hypertension   . Bipolar 1 disorder ([place])   . Anxiety   . Depression   . Vaginal Pap smear, abnormal   . CHF (congestive heart failure) ([place])   . Emphysema of lung ([place])   . Hyperlipidemia    Past Surgical History  Procedure Laterality Date  . Kidney stone surgery    . Cholecystectomy    . Hernia repair      Dr. Arnoldo Morale  . Colonoscopy  July 2010    Dr. Arnoldo Morale: 3 rectal polyps, not enough tissue for pathologic examination, recommended surveillance in 3 years  . Esophagogastroduodenoscopy  July 2010    Dr. Arnoldo Morale: gastritis and duodenitis, H.pylori negative  . Colonoscopy N/A 10/23/2014    RMR: Multiple colonic polyps removed as described above. No endoscopic explaniation for abdominal pain. however. next tcs 10/2019  . Esophagogastroduodenoscopy N/A 10/23/2014    RMR: Normal EGD. Status post passage of a Maloney dilator. Today's finding s would not explain abdominal pain  . Maloney dilation N/A 10/23/2014    Procedure: Venia Minks DILATION;  Surgeon: Daneil Dolin, MD;  Location: [facility];  Service: Endoscopy;  Laterality: N/A;   Family History  Problem Relation Age of Onset  . Adopted: Yes   Social History  Substance Use Topics  . Smoking status: Current Some Day Smoker -- 0.50 packs/day for 42 years    Types: Cigarettes  . Smokeless tobacco: Never Used     Comment: 1/2 pack daily  . Alcohol Use: 0.6 oz/week    1 Standard drinks or equivalent per week     Comment: quit 11/03/2014. used to drink couple of  40 ounces. would do it couple of times per week. denies chronic use.   OB History    No data available     Review of Systems  Cardiovascular: Positive for leg swelling. Negative for chest pain.  All other systems reviewed and are negative.     Allergies  Penicillins and Septra  Home Medications   Prior to Admission medications   Medication Sig Start Date End Date Taking? Authorizing Provider  albuterol-ipratropium (COMBIVENT) 18-103 MCG/ACT inhaler Inhale 2 puffs into the lungs every 6 (six) hours as needed for shortness of breath.    Yes Historical Provider, MD  budesonide-formoterol (SYMBICORT) 160-4.5 MCG/ACT inhaler Inhale 2 puffs into the lungs 2 (two) times daily.   Yes Historical Provider, MD  dexlansoprazole (DEXILANT) 60 MG capsule Take 1 capsule (60 mg total) by mouth daily. 08/29/14  Yes Mahala Menghini, PA-C  FLUoxetine (PROZAC) 20 MG capsule Take 20 mg by mouth daily.   Yes Historical Provider, MD  fluticasone (FLONASE) 50 MCG/ACT nasal spray Place 1 spray into both nostrils 2 (two) times daily as needed for allergies or rhinitis. 07/18/15  Yes Fransisca Kaufmann Dettinger, MD  Fluticasone Furoate-Vilanterol (BREO ELLIPTA) 100-25 MCG/INH AEPB Inhale 1 puff into the lungs daily.   Yes Historical Provider, MD  furosemide (LASIX) 40 MG  tablet Take 1 tablet (40 mg total) by mouth daily. Patient taking differently: Take 20 mg by mouth daily.  08/01/15  Yes Mary-Margaret Hassell Done, FNP  hydrOXYzine (ATARAX/VISTARIL) 50 MG tablet Take 50 mg by mouth 3 (three) times daily as needed for anxiety.   Yes Historical Provider, MD  ibuprofen (ADVIL,MOTRIN) 800 MG tablet Take 800 mg by mouth every 6 (six) hours as needed for moderate pain.   Yes Historical Provider, MD  lidocaine (XYLOCAINE) 2 % solution TAKE 10 ML BY MOUTH 3 TIMES DAILY BEFORE MEALS AND AT BEDTIME FOR ABDOMINAL PAIN. NO MORE THAN 4 DOSES DAILY. 07/29/15  Yes Mahala Menghini, PA-C  lisinopril (PRINIVIL,ZESTRIL) 10 MG tablet Take 1 tablet  (10 mg total) by mouth daily. 05/22/15  Yes Fransisca Kaufmann Dettinger, MD  nitroGLYCERIN (NITROSTAT) 0.4 MG SL tablet Place 0.4 mg under the tongue every 5 (five) minutes as needed for chest pain.   Yes Historical Provider, MD  promethazine (PHENERGAN) 25 MG tablet TAKE (1) TABLET BY MOUTH EVERY SIX HOURS AS NEEDED. 07/08/15  Yes Mahala Menghini, PA-C  rOPINIRole (REQUIP) 1 MG tablet Take 1 tablet (1 mg total) by mouth at bedtime. 07/18/15  Yes Fransisca Kaufmann Dettinger, MD  theophylline (THEO-24) 200 MG 24 hr capsule Take 200 mg by mouth daily.   Yes Historical Provider, MD  azithromycin (ZITHROMAX) 250 MG tablet Take one daily until gone 08/02/15   Leonard Schwartz, MD  CARAFATE 1 GM/10ML suspension TAKE 10MLs BY MOUTH FOUR TIMES DAILY BEFORE MEALS AND AT BEDTIME. Patient not taking: Reported on 08/02/2015 07/29/15   Mahala Menghini, PA-C   BP 121/62 mmHg  Pulse 78  Temp(Src) 97.7 F (36.5 C) (Oral)  Resp 22  Ht '5\' 4"'$  (1.626 m)  Wt 227 lb (102.967 kg)  BMI 38.95 kg/m2  SpO2 92% Physical Exam  Constitutional: She is oriented to person, place, and time. She appears well-developed and well-nourished. No distress.  HENT:  Head: Normocephalic and atraumatic.  Eyes: Pupils are equal, round, and reactive to light.  Neck: Normal range of motion.  Cardiovascular: Normal rate and intact distal pulses.   Pulmonary/Chest: No respiratory distress.  Abdominal: Normal appearance. She exhibits no distension.  Musculoskeletal: Normal range of motion. She exhibits edema.       Right ankle: She exhibits swelling.       Left ankle: She exhibits swelling.  Neurological: She is alert and oriented to person, place, and time. No cranial nerve deficit.  Skin: Skin is warm and dry. No rash noted.  Psychiatric: She has a normal mood and affect. Her behavior is normal.  Nursing note and vitals reviewed.   ED Course  Procedures (including critical care time) Medications  azithromycin (ZITHROMAX) tablet 500 mg (not administered)   acetaminophen (TYLENOL) tablet 650 mg (650 mg Oral Given 08/02/15 1450)    Labs Review Labs Reviewed  BASIC METABOLIC PANEL - Abnormal; Notable for the following:    Glucose, Bld 101 (*)    All other components within normal limits  CBC WITH DIFFERENTIAL/PLATELET  BRAIN NATRIURETIC PEPTIDE  I-STAT TROPOININ, ED    Imaging Review Dg Chest 2 View  08/02/2015  CLINICAL DATA:  Increasing shortness of breath and cough for 5 days. EXAM: CHEST  2 VIEW COMPARISON:  09/17/2014. FINDINGS: Lungs are hyperexpanded. Interstitial markings are diffusely coarsened with chronic features. Patchy airspace disease is identified in the right infrahilar region. Small right pleural effusion noted. The cardiopericardial silhouette is within normal limits for size. The visualized  bony structures of the thorax are intact. Telemetry leads overlie the chest. IMPRESSION: Patchy airspace disease at the right base with small right pleural effusion. Pneumonia would be a consideration. Follow-up imaging is recommended as unilateral pleural effusion can be a sign of malignancy. Hyperexpansion with chronic interstitial coarsening suggests emphysema. Electronically Signed   By: Misty Stanley M.D.   On: 08/02/2015 14:49   I have personally reviewed and evaluated these images and lab results as part of my medical decision-making.   EKG Interpretation   Date/Time:  Saturday August 02 2015 13:58:04 EST Ventricular Rate:  78 PR Interval:  165 QRS Duration: 83 QT Interval:  405 QTC Calculation: 461 R Axis:   72 Text Interpretation:  Sinus rhythm Confirmed by Audie Pinto  MD, Donielle Radziewicz ([id number])  on 08/02/2015 2:35:17 PM      MDM   Final diagnoses:  Abnormal chest x-ray  Shortness of breath        Leonard Schwartz, MD 08/02/15 1520

## 2015-08-02 NOTE — ED Notes (Signed)
Pt requesting Tylenol for headache. Dr. Audie Pinto notified

## 2015-08-02 NOTE — Discharge Instructions (Signed)
You need to finish her antibiotics and then have a repeat chest x-ray done at her doctor's office to make sure that everything is cleared up.  He did have some abnormalities on your chest x-ray that we need to make sure has cleared up.    Shortness of Breath Shortness of breath means you have trouble breathing. Shortness of breath needs medical care right away. HOME CARE   Do not smoke.  Avoid being around chemicals or things (paint fumes, dust) that may bother your breathing.  Rest as needed. Slowly begin your normal activities.  Only take medicines as told by your doctor.  Keep all doctor visits as told. GET HELP RIGHT AWAY IF:   Your shortness of breath gets worse.  You feel lightheaded, pass out (faint), or have a cough that is not helped by medicine.  You cough up blood.  You have pain with breathing.  You have pain in your chest, arms, shoulders, or belly (abdomen).  You have a fever.  You cannot walk up stairs or exercise the way you normally do.  You do not get better in the time expected.  You have a hard time doing normal activities even with rest.  You have problems with your medicines.  You have any new symptoms. MAKE SURE YOU:  Understand these instructions.  Will watch your condition.  Will get help right away if you are not doing well or get worse.   This information is not intended to replace advice given to you by your health care provider. Make sure you discuss any questions you have with your health care provider.   Document Released: 12/08/2007 Document Revised: 06/26/2013 Document Reviewed: 09/06/2011 Elsevier Interactive Patient Education [employer].

## 2015-08-02 NOTE — ED Notes (Signed)
Pt reports increasingly worse SOB and cough x 5 days. States she also has edema to abdomen and bilateral lower extremities. Pt has hx of CHF and COPD. Pt reports taking '40mg'$  lasix PTA.

## 2015-08-04 NOTE — Telephone Encounter (Signed)
Patient seen on 1/27

## 2015-08-05 ENCOUNTER — Telehealth: Payer: Self-pay | Admitting: *Deleted

## 2015-08-05 ENCOUNTER — Telehealth: Payer: Self-pay | Admitting: Family Medicine

## 2015-08-05 DIAGNOSIS — R739 Hyperglycemia, unspecified: Secondary | ICD-10-CM

## 2015-08-05 NOTE — Telephone Encounter (Signed)
Pt states that she is going to come in Friday for an appointment and wanted to know what he would do. Pt had some questions about the procedure she was going to have on Friday. Pt was explained everything and felt better about her procedure!!  Pt also wanted to know if there was a medication she could be given to help her with sex drive.

## 2015-08-05 NOTE — Telephone Encounter (Signed)
Spoke with patient and patient aware that she needs to come back by for a HgbA1C.

## 2015-08-07 ENCOUNTER — Other Ambulatory Visit: Payer: Self-pay | Admitting: Cardiology

## 2015-08-08 ENCOUNTER — Ambulatory Visit: Payer: Medicaid Other | Admitting: Obstetrics and Gynecology

## 2015-08-08 ENCOUNTER — Ambulatory Visit: Payer: Medicaid Other | Admitting: Internal Medicine

## 2015-08-11 ENCOUNTER — Other Ambulatory Visit: Payer: Self-pay | Admitting: Gastroenterology

## 2015-08-11 ENCOUNTER — Ambulatory Visit: Payer: Medicaid Other | Admitting: Obstetrics and Gynecology

## 2015-08-14 ENCOUNTER — Encounter: Payer: Self-pay | Admitting: Obstetrics and Gynecology

## 2015-08-14 ENCOUNTER — Ambulatory Visit: Payer: Medicaid Other | Admitting: Obstetrics and Gynecology

## 2015-08-15 ENCOUNTER — Ambulatory Visit: Payer: Medicaid Other | Admitting: Internal Medicine

## 2015-08-15 ENCOUNTER — Telehealth: Payer: Self-pay | Admitting: Internal Medicine

## 2015-08-15 NOTE — Telephone Encounter (Signed)
Pt was a no show

## 2015-08-18 ENCOUNTER — Telehealth: Payer: Self-pay | Admitting: Family Medicine

## 2015-08-18 DIAGNOSIS — G2581 Restless legs syndrome: Secondary | ICD-10-CM

## 2015-08-18 MED ORDER — LISINOPRIL 20 MG PO TABS: 20.0000 mg | ORAL_TABLET | Freq: Every day | ORAL | Status: DC

## 2015-08-18 MED ORDER — ROPINIROLE HCL 1 MG PO TABS: 1.0000 mg | ORAL_TABLET | Freq: Three times a day (TID) | ORAL | Status: DC

## 2015-08-18 NOTE — Telephone Encounter (Signed)
Patient is taking Requip 3 times daily.  Will change Rx.

## 2015-08-18 NOTE — Telephone Encounter (Signed)
Will you please look at this, in the past i see we had rx'd requip '1mg'$  TID, but the lisinopril looks like it has always been 10 mg. Please advise

## 2015-08-18 NOTE — Telephone Encounter (Signed)
She told me in the visit that she was only using the Requip at night that's why I changed the prescription, we can change it back to 1 mg twice a day or 3 times a day if she is using that much but I was just trying to reduce it down to what she said she was actually using. I am okay with her taking the lisinopril 20 mg and sent a new prescription for that. Caryl Pina, MD [facility] 08/18/2015, 3:40 PM

## 2015-08-19 ENCOUNTER — Ambulatory Visit: Payer: Medicaid Other | Admitting: Nurse Practitioner

## 2015-08-21 ENCOUNTER — Telehealth: Payer: Self-pay | Admitting: Internal Medicine

## 2015-08-21 NOTE — Telephone Encounter (Signed)
PATIENT WANTS TO SPEAK TO A NURSE ABOUT HER ABD PAIN   PLEASE CALL [phone]   OR [phone]

## 2015-08-21 NOTE — Telephone Encounter (Signed)
Tried to call pt- NA- LMOM 

## 2015-08-25 NOTE — Telephone Encounter (Signed)
Finally got in touch with the pt- she said she will go a few days and feel good and then her stomach will start hurting and it will hurt really bad for a day or two. It just comes and goes. +nausea, no vomiting, no fever. It doesn't matter if she eats or not. She does get some constipation at times and takes an otc laxative as needed. She said she doesn't need it all the time. No blood in her stool. Last saw [facility] 11/2014. Will check and see if pt went to [facility] for appt.

## 2015-08-26 NOTE — Telephone Encounter (Signed)
She has not been seen since 11/2014. Reviewed rectal u/s report, unremarkable. To be scanned. Patient needs OV with RMR only. She no showed her last two appointments with him, last one was on 08/15/15.

## 2015-08-26 NOTE — Telephone Encounter (Signed)
Please schedule ov and let her know.

## 2015-08-26 NOTE — Telephone Encounter (Signed)
Pt had rectal U/S on 12/12/14. Results in [facility] cart

## 2015-08-26 NOTE — Telephone Encounter (Signed)
PATIENT ALREADY SCHEDULED FOR 03/07 WITH RMR

## 2015-09-04 ENCOUNTER — Other Ambulatory Visit: Payer: Self-pay | Admitting: Gastroenterology

## 2015-09-09 ENCOUNTER — Telehealth: Payer: Self-pay | Admitting: Internal Medicine

## 2015-09-09 ENCOUNTER — Ambulatory Visit: Payer: Medicaid Other | Admitting: Internal Medicine

## 2015-09-09 ENCOUNTER — Encounter: Payer: Self-pay | Admitting: Internal Medicine

## 2015-09-09 ENCOUNTER — Other Ambulatory Visit: Payer: Self-pay | Admitting: Cardiology

## 2015-09-09 NOTE — Telephone Encounter (Signed)
PT WAS A NO SHOW AND LETTER SENT  °

## 2015-09-11 ENCOUNTER — Other Ambulatory Visit: Payer: Self-pay | Admitting: Family Medicine

## 2015-09-23 ENCOUNTER — Other Ambulatory Visit: Payer: Self-pay | Admitting: Gastroenterology

## 2015-10-13 ENCOUNTER — Other Ambulatory Visit: Payer: Self-pay | Admitting: Gastroenterology

## 2015-10-14 ENCOUNTER — Other Ambulatory Visit: Payer: Self-pay | Admitting: Gastroenterology

## 2015-10-15 ENCOUNTER — Encounter: Payer: Medicaid Other | Admitting: Obstetrics and Gynecology

## 2015-10-15 ENCOUNTER — Encounter: Payer: Self-pay | Admitting: *Deleted

## 2015-10-22 ENCOUNTER — Other Ambulatory Visit (HOSPITAL_COMMUNITY): Payer: Self-pay | Admitting: Pulmonary Disease

## 2015-10-22 ENCOUNTER — Ambulatory Visit (HOSPITAL_COMMUNITY)
Admission: RE | Admit: 2015-10-22 | Discharge: 2015-10-22 | Disposition: A | Discharge status: Home / Self Care | Payer: Medicaid Other | Source: Ambulatory Visit | Attending: Pulmonary Disease | Admitting: Pulmonary Disease

## 2015-10-22 DIAGNOSIS — J90 Pleural effusion, not elsewhere classified: Secondary | ICD-10-CM | POA: Insufficient documentation

## 2015-10-22 DIAGNOSIS — R918 Other nonspecific abnormal finding of lung field: Secondary | ICD-10-CM | POA: Diagnosis not present

## 2015-10-22 DIAGNOSIS — R0602 Shortness of breath: Secondary | ICD-10-CM

## 2015-10-22 DIAGNOSIS — J189 Pneumonia, unspecified organism: Secondary | ICD-10-CM | POA: Diagnosis not present

## 2015-10-28 ENCOUNTER — Other Ambulatory Visit: Payer: Self-pay | Admitting: Gastroenterology

## 2015-10-31 ENCOUNTER — Other Ambulatory Visit: Payer: Self-pay

## 2015-11-04 ENCOUNTER — Ambulatory Visit: Payer: Medicaid Other | Admitting: Nurse Practitioner

## 2015-11-04 MED ORDER — PROMETHAZINE HCL 25 MG PO TABS: ORAL_TABLET | ORAL | Status: DC

## 2015-11-05 ENCOUNTER — Encounter: Payer: Self-pay | Admitting: Nurse Practitioner

## 2015-11-06 ENCOUNTER — Other Ambulatory Visit (HOSPITAL_COMMUNITY): Payer: Self-pay | Admitting: Pulmonary Disease

## 2015-11-06 DIAGNOSIS — J189 Pneumonia, unspecified organism: Secondary | ICD-10-CM

## 2015-11-06 DIAGNOSIS — R0602 Shortness of breath: Secondary | ICD-10-CM

## 2015-11-24 DIAGNOSIS — Z8701 Personal history of pneumonia (recurrent): Secondary | ICD-10-CM | POA: Insufficient documentation

## 2015-11-24 DIAGNOSIS — R600 Localized edema: Secondary | ICD-10-CM | POA: Insufficient documentation

## 2015-11-24 DIAGNOSIS — R062 Wheezing: Secondary | ICD-10-CM | POA: Insufficient documentation

## 2015-11-29 ENCOUNTER — Encounter (HOSPITAL_COMMUNITY): Payer: Self-pay

## 2015-11-29 ENCOUNTER — Emergency Department (HOSPITAL_COMMUNITY): Payer: Medicaid Other

## 2015-11-29 ENCOUNTER — Emergency Department (HOSPITAL_COMMUNITY)
Admission: EM | Admit: 2015-11-29 | Discharge: 2015-11-29 | Disposition: A | Discharge status: Home / Self Care | Payer: Medicaid Other | Attending: Emergency Medicine | Admitting: Emergency Medicine

## 2015-11-29 DIAGNOSIS — Y939 Activity, unspecified: Secondary | ICD-10-CM | POA: Insufficient documentation

## 2015-11-29 DIAGNOSIS — Z791 Long term (current) use of non-steroidal anti-inflammatories (NSAID): Secondary | ICD-10-CM | POA: Insufficient documentation

## 2015-11-29 DIAGNOSIS — S52601A Unspecified fracture of lower end of right ulna, initial encounter for closed fracture: Secondary | ICD-10-CM | POA: Insufficient documentation

## 2015-11-29 DIAGNOSIS — F319 Bipolar disorder, unspecified: Secondary | ICD-10-CM | POA: Diagnosis not present

## 2015-11-29 DIAGNOSIS — Y929 Unspecified place or not applicable: Secondary | ICD-10-CM | POA: Diagnosis not present

## 2015-11-29 DIAGNOSIS — S52501A Unspecified fracture of the lower end of right radius, initial encounter for closed fracture: Secondary | ICD-10-CM | POA: Insufficient documentation

## 2015-11-29 DIAGNOSIS — Y999 Unspecified external cause status: Secondary | ICD-10-CM | POA: Insufficient documentation

## 2015-11-29 DIAGNOSIS — I509 Heart failure, unspecified: Secondary | ICD-10-CM | POA: Diagnosis not present

## 2015-11-29 DIAGNOSIS — F17210 Nicotine dependence, cigarettes, uncomplicated: Secondary | ICD-10-CM | POA: Insufficient documentation

## 2015-11-29 DIAGNOSIS — E785 Hyperlipidemia, unspecified: Secondary | ICD-10-CM | POA: Diagnosis not present

## 2015-11-29 DIAGNOSIS — S59911A Unspecified injury of right forearm, initial encounter: Secondary | ICD-10-CM | POA: Diagnosis present

## 2015-11-29 DIAGNOSIS — J449 Chronic obstructive pulmonary disease, unspecified: Secondary | ICD-10-CM | POA: Diagnosis not present

## 2015-11-29 DIAGNOSIS — W109XXA Fall (on) (from) unspecified stairs and steps, initial encounter: Secondary | ICD-10-CM | POA: Diagnosis not present

## 2015-11-29 DIAGNOSIS — I110 Hypertensive heart disease with heart failure: Secondary | ICD-10-CM | POA: Diagnosis not present

## 2015-11-29 DIAGNOSIS — Z79899 Other long term (current) drug therapy: Secondary | ICD-10-CM | POA: Diagnosis not present

## 2015-11-29 DIAGNOSIS — S5291XA Unspecified fracture of right forearm, initial encounter for closed fracture: Secondary | ICD-10-CM

## 2015-11-29 MED ORDER — OXYCODONE-ACETAMINOPHEN 5-325 MG PO TABS
2.0000 | ORAL_TABLET | Freq: Once | ORAL | Status: AC
  Administered 2015-11-29: 2 via ORAL
  Filled 2015-11-29: qty 2

## 2015-11-29 MED ORDER — OXYCODONE-ACETAMINOPHEN 5-325 MG PO TABS: 1.0000 | ORAL_TABLET | ORAL | Status: DC | PRN

## 2015-11-29 NOTE — ED Notes (Signed)
Reports of falling down 1 step last night and now having pain in right wrist/forearm.

## 2015-11-29 NOTE — ED Provider Notes (Signed)
CSN: [id number]     Arrival date & time 11/29/15  1206 History   First MD Initiated Contact with Patient 11/29/15 1243     Chief Complaint  Patient presents with  . Fall     (Consider location/radiation/quality/duration/timing/severity/associated sxs/prior Treatment) The history is provided by the patient.   [name] is a 60 y.o. female presenting with Pain and swelling to her right wrist.  She tripped going down a stoop yesterday evening, falling down one step and landing on her outstretched right forearm.  She endorses persistent and worsening pain and swelling of her wrist and forearm.  She denies any hand pain or numbness And no radiation of pain beyond mid forearm.  She has taken ibuprofen prior to arrival with no significant improvement in her symptoms.    Past Medical History  Diagnosis Date  . COPD (chronic obstructive pulmonary disease) ([place])   . Hypertension   . Bipolar 1 disorder ([place])   . Anxiety   . Depression   . Vaginal Pap smear, abnormal   . CHF (congestive heart failure) ([place])   . Emphysema of lung ([place])   . Hyperlipidemia    Past Surgical History  Procedure Laterality Date  . Kidney stone surgery    . Cholecystectomy    . Hernia repair      Dr. Arnoldo Morale  . Colonoscopy  July 2010    Dr. Arnoldo Morale: 3 rectal polyps, not enough tissue for pathologic examination, recommended surveillance in 3 years  . Esophagogastroduodenoscopy  July 2010    Dr. Arnoldo Morale: gastritis and duodenitis, H.pylori negative  . Colonoscopy N/A 10/23/2014    RMR: Multiple colonic polyps removed as described above. No endoscopic explaniation for abdominal pain. however. next tcs 10/2019  . Esophagogastroduodenoscopy N/A 10/23/2014    RMR: Normal EGD. Status post passage of a Maloney dilator. Today's finding s would not explain abdominal pain  . Maloney dilation N/A 10/23/2014    Procedure: Venia Minks DILATION;  Surgeon: Daneil Dolin, MD;  Location[facility];  Service: Endoscopy;   Laterality: N/A;   Family History  Problem Relation Age of Onset  . Adopted: Yes   Social History  Substance Use Topics  . Smoking status: Current Some Day Smoker -- 0.50 packs/day for 42 years    Types: Cigarettes  . Smokeless tobacco: Never Used     Comment: 1/2 pack daily  . Alcohol Use: 0.6 oz/week    1 Standard drinks or equivalent per week     Comment: quit 11/03/2014. used to drink couple of 40 ounces. would do it couple of times per week. denies chronic use.   OB History    No data available     Review of Systems  Constitutional: Negative for fever.  Musculoskeletal: Positive for joint swelling and arthralgias. Negative for myalgias.  Neurological: Negative for weakness and numbness.      Allergies  Penicillins and Septra  Home Medications   Prior to Admission medications   Medication Sig Start Date End Date Taking? Authorizing Provider  albuterol-ipratropium (COMBIVENT) 18-103 MCG/ACT inhaler Inhale 2 puffs into the lungs every 6 (six) hours as needed for shortness of breath.    Yes Historical Provider, MD  budesonide-formoterol (SYMBICORT) 160-4.5 MCG/ACT inhaler Inhale 2 puffs into the lungs 2 (two) times daily.   Yes Historical Provider, MD  DEXILANT 60 MG capsule TAKE 1 CAPSULE BY MOUTH ONCE DAILY. 10/14/15  Yes Orvil Feil, NP  fluticasone (FLONASE) 50 MCG/ACT nasal spray Place 1 spray  into both nostrils 2 (two) times daily as needed for allergies or rhinitis. 07/18/15  Yes Fransisca Kaufmann Dettinger, MD  furosemide (LASIX) 40 MG tablet Take 1 tablet (40 mg total) by mouth daily. Patient taking differently: Take 20 mg by mouth daily.  08/01/15  Yes Mary-Margaret Hassell Done, FNP  ibuprofen (ADVIL,MOTRIN) 800 MG tablet Take 800 mg by mouth every 6 (six) hours as needed for moderate pain.   Yes Historical Provider, MD  lisinopril (PRINIVIL,ZESTRIL) 20 MG tablet Take 1 tablet (20 mg total) by mouth daily. 08/18/15  Yes Fransisca Kaufmann Dettinger, MD  nitroGLYCERIN (NITROSTAT) 0.4 MG SL  tablet DISSOLVE 1 TABLET UNDER THE TONGUE EVERY 5 MINUTES IF NEEDED FOR CHEST PAIN. MAX 3 DOSES THEN CALL 911. 09/09/15  Yes Arnoldo Lenis, MD  rOPINIRole (REQUIP) 1 MG tablet TAKE 1 TABLET BY MOUTH 3 TIMES DAILY. 09/12/15  Yes Fransisca Kaufmann Dettinger, MD  theophylline (THEO-24) 200 MG 24 hr capsule Take 200 mg by mouth daily.   Yes Historical Provider, MD  tiotropium (SPIRIVA) 18 MCG inhalation capsule Place 18 mcg into inhaler and inhale daily.   Yes Historical Provider, MD  oxyCODONE-acetaminophen (PERCOCET/ROXICET) 5-325 MG tablet Take 1 tablet by mouth every 4 (four) hours as needed. 11/29/15   Evalee Jefferson, PA-C   BP 102/65 mmHg  Pulse 107  Temp(Src) 98.9 F (37.2 C) (Oral)  Resp 16  Ht '5\' 4"'$  (1.626 m)  Wt 105.688 kg  BMI 39.97 kg/m2  SpO2 94% Physical Exam  Constitutional: She appears well-developed and well-nourished.  HENT:  Head: Atraumatic.  Neck: Normal range of motion.  Cardiovascular:  Pulses:      Radial pulses are 2+ on the right side, and 2+ on the left side.  Pulses equal bilaterally.  Less than 2 second cap refill in fingertips.  Musculoskeletal: She exhibits tenderness.       Right wrist: She exhibits decreased range of motion, bony tenderness and swelling. She exhibits no crepitus.  Neurological: She is alert. She has normal strength. She displays normal reflexes. No sensory deficit.  Skin: Skin is warm and dry.  Psychiatric: She has a normal mood and affect.    ED Course  Procedures (including critical care time) Labs Review Labs Reviewed - No data to display  Imaging Review Dg Forearm Right  11/29/2015  CLINICAL DATA:  60 year old female with distal forearm and wrist pain after falling last night. EXAM: RIGHT FOREARM - 2 VIEW COMPARISON:  Concurrently obtained radiographs of the right wrist FINDINGS: Acute fractures of the distal radial and ulnar metastases. There is a moderate dorsal angulation of the distal radial fracture site. No definite intra-articular  extension. There is associated soft tissue swelling overlying the wrist. IMPRESSION: Acute distal both bone forearm fracture with dorsal tilt of the distal radial fracture fragment. Electronically Signed   By: Jacqulynn Cadet M.D.   On: 11/29/2015 12:48   Dg Wrist Complete Right  11/29/2015  CLINICAL DATA:  Acute right wrist pain and swelling after fall last night. Initial encounter. EXAM: RIGHT WRIST - COMPLETE 3+ VIEW COMPARISON:  None. FINDINGS: Minimally displaced fractures are seen involving the distal radius and ulna. These are closed and posttraumatic. Joint spaces are intact. No significant soft tissue abnormality is noted. IMPRESSION: Minimally displaced distal radial and ulnar fractures. Electronically Signed   By: Marijo Conception, M.D.   On: 11/29/2015 12:48   I have personally reviewed and evaluated these images and lab results as part of my medical decision-making.  EKG Interpretation None      MDM   Final diagnoses:  Forearm fracture, right, closed, initial encounter    Radiological studies were viewed, interpreted and considered during the medical decision making and disposition process. I agree with radiologists reading.  Results were also discussed with patient.    Patient was placed in a sugar tong splint, sling given.  Examined patient post-splint application, her pain is improved with continued normal fingertip Refill.  Discussed patient with Dr. Fredna Dow who also reviewed the x-rays.  Patient will call his office early Tuesday morning for an appointment time to discuss surgery.  She was prescribed oxycodone when necessary pain relief, encouraged ice, elevation.  Encouraged recheck here sooner than Tuesday if she develops any new or worsened symptoms.     Evalee Jefferson, PA-C 11/29/15 1720  Milton Ferguson, MD 11/30/15 1016

## 2015-11-29 NOTE — Discharge Instructions (Signed)
Forearm Fracture A forearm fracture is a break in one or both of the bones of your arm that are between the elbow and the wrist. Your forearm is made up of two bones:  Radius. This is the bone on the inside of your arm near your thumb.  Ulna. This is the bone on the outside of your arm near your little finger. Middle forearm fractures usually break both the radius and the ulna. Most forearm fractures that involve both the ulna and radius will require surgery. CAUSES Common causes of this type of fracture include:  Falling on an outstretched arm.  Accidents, such as a car or bike accident.  A hard, direct hit to the middle part of your arm. RISK FACTORS You may be at higher risk for this type of fracture if:  You play contact sports.  You have a condition that causes your bones to be weak or thin (osteoporosis). SIGNS AND SYMPTOMS A forearm fracture causes pain immediately after the injury. Other signs and symptoms include:  An abnormal bend or bump in your arm (deformity).  Swelling.  Numbness or tingling.  Tenderness.  Inability to turn your hand from side to side (rotate).  Bruising. DIAGNOSIS Your health care provider may diagnose a forearm fracture based on:  Your symptoms.  Your medical history, including any recent injury.  A physical exam. Your health care provider will look for any deformity and feel for tenderness over the break. Your health care provider will also check whether the bones are out of place.  An X-ray exam to confirm the diagnosis and learn more about the type of fracture. TREATMENT The goals of treatment are to get the bone or bones in proper position for healing and to keep the bones from moving so they will heal over time. Your treatment will depend on many factors, especially the type of fracture that you have.  If the fractured bone or bones:  Are in the correct position (nondisplaced), you may only need to wear a cast or a  splint.  Have a slightly displaced fracture, you may need to have the bones moved back into place manually (closed reduction) before the splint or cast is put on.  You may have a temporary splint before you have a cast. The splint allows room for some swelling. After a few days, a cast can replace the splint.  You may have to wear the cast for 6-8 weeks or as directed by your health care provider.  The cast may be changed after about 3 weeks or as directed by your health care provider.  After your cast is removed, you may need physical therapy to regain full movement in your wrist or elbow.  You may need emergency surgery if you have:  A fractured bone or bones that are out of position (displaced).  A fracture with multiple fragments (comminuted fracture).  A fracture that breaks the skin (open fracture). This type of fracture may require surgical wires, plates, or screws to hold the bone or bones in place.  You may have X-rays every couple of weeks to check on your healing. HOME CARE INSTRUCTIONS If You Have a Cast:  Do not stick anything inside the cast to scratch your skin. Doing that increases your risk of infection.  Check the skin around the cast every day. Report any concerns to your health care provider. You may put lotion on dry skin around the edges of the cast. Do not apply lotion to the skin  underneath the cast. If You Have a Splint:  Wear it as directed by your health care provider. Remove it only as directed by your health care provider.  Loosen the splint if your fingers become numb and tingle, or if they turn cold and blue. Bathing  Cover the cast or splint with a watertight plastic bag to protect it from water while you bathe or shower. Do not let the cast or splint get wet. Managing Pain, Stiffness, and Swelling  If directed, apply ice to the injured area:  Put ice in a plastic bag.  Place a towel between your skin and the bag.  Leave the ice on for 20  minutes, 2-3 times a day.  Move your fingers often to avoid stiffness and to lessen swelling.  Raise the injured area above the level of your heart while you are sitting or lying down. Driving  Do not drive or operate heavy machinery while taking pain medicine.  Do not drive while wearing a cast or splint on a hand that you use for driving. Activity  Return to your normal activities as directed by your health care provider. Ask your health care provider what activities are safe for you.  Perform range-of-motion exercises only as directed by your health care provider. Safety  Do not use your injured limb to support your body weight until your health care provider says that you can. General Instructions  Do not put pressure on any part of the cast or splint until it is fully hardened. This may take several hours.  Keep the cast or splint clean and dry.  Do not use any tobacco products, including cigarettes, chewing tobacco, or electronic cigarettes. Tobacco can delay bone healing. If you need help quitting, ask your health care provider.  Take medicines only as directed by your health care provider.  Keep all follow-up visits as directed by your health care provider. This is important. SEEK MEDICAL CARE IF:  Your pain medicine is not helping.  Your cast or splint becomes wet or damaged or suddenly feels too tight.  Your cast becomes loose.  You have more severe pain or swelling than you did before the cast.  You have severe pain when you stretch your fingers.  You continue to have pain or stiffness in your elbow or your wrist after your cast is removed. SEEK IMMEDIATE MEDICAL CARE IF:  You cannot move your fingers.  You lose feeling in your fingers or your hand.  Your hand or your fingers turn cold and pale or blue.  You notice a bad smell coming from your cast.  You have drainage from underneath your cast.  You have new stains from blood or drainage that is coming  through your cast.   This information is not intended to replace advice given to you by your health care provider. Make sure you discuss any questions you have with your health care provider.   Document Released: 06/18/2000 Document Revised: 07/12/2014 Document Reviewed: 02/04/2014 Elsevier Interactive Patient Education 2016 [place] or Splint Care Casts and splints support injured limbs and keep bones from moving while they heal. It is important to care for your cast or splint at home.  HOME CARE INSTRUCTIONS  Keep the cast or splint uncovered during the drying period. It can take 24 to 48 hours to dry if it is made of plaster. A fiberglass cast will dry in less than 1 hour.  Do not rest the cast on anything harder than  a pillow for the first 24 hours.  Do not put weight on your injured limb or apply pressure to the cast until your health care provider gives you permission.  Keep the cast or splint dry. Wet casts or splints can lose their shape and may not support the limb as well. A wet cast that has lost its shape can also create harmful pressure on your skin when it dries. Also, wet skin can become infected.  Cover the cast or splint with a plastic bag when bathing or when out in the rain or snow. If the cast is on the trunk of the body, take sponge baths until the cast is removed.  If your cast does become wet, dry it with a towel or a blow dryer on the cool setting only.  Keep your cast or splint clean. Soiled casts may be wiped with a moistened cloth.  Do not place any hard or soft foreign objects under your cast or splint, such as cotton, toilet paper, lotion, or powder.  Do not try to scratch the skin under the cast with any object. The object could get stuck inside the cast. Also, scratching could lead to an infection. If itching is a problem, use a blow dryer on a cool setting to relieve discomfort.  Do not trim or cut your cast or remove padding from inside of  it.  Exercise all joints next to the injury that are not immobilized by the cast or splint. For example, if you have a long leg cast, exercise the hip joint and toes. If you have an arm cast or splint, exercise the shoulder, elbow, thumb, and fingers.  Elevate your injured arm or leg on 1 or 2 pillows for the first 1 to 3 days to decrease swelling and pain.It is best if you can comfortably elevate your cast so it is higher than your heart. SEEK MEDICAL CARE IF:   Your cast or splint cracks.  Your cast or splint is too tight or too loose.  You have unbearable itching inside the cast.  Your cast becomes wet or develops a soft spot or area.  You have a bad smell coming from inside your cast.  You get an object stuck under your cast.  Your skin around the cast becomes red or raw.  You have new pain or worsening pain after the cast has been applied. SEEK IMMEDIATE MEDICAL CARE IF:   You have fluid leaking through the cast.  You are unable to move your fingers or toes.  You have discolored (blue or white), cool, painful, or very swollen fingers or toes beyond the cast.  You have tingling or numbness around the injured area.  You have severe pain or pressure under the cast.  You have any difficulty with your breathing or have shortness of breath.  You have chest pain.   This information is not intended to replace advice given to you by your health care provider. Make sure you discuss any questions you have with your health care provider.   Document Released: 06/18/2000 Document Revised: 04/11/2013 Document Reviewed: 12/28/2012 Elsevier Interactive Patient Education 2016 [place] may take the oxycodone prescribed for pain relief.  This will make you drowsy - do not drive within 4 hours of taking this medication.

## 2015-12-03 ENCOUNTER — Other Ambulatory Visit: Payer: Self-pay | Admitting: Orthopedic Surgery

## 2015-12-04 ENCOUNTER — Other Ambulatory Visit (HOSPITAL_COMMUNITY): Payer: Self-pay | Admitting: Respiratory Therapy

## 2015-12-04 DIAGNOSIS — J441 Chronic obstructive pulmonary disease with (acute) exacerbation: Secondary | ICD-10-CM

## 2015-12-05 ENCOUNTER — Encounter (HOSPITAL_BASED_OUTPATIENT_CLINIC_OR_DEPARTMENT_OTHER): Payer: Self-pay | Admitting: *Deleted

## 2015-12-08 ENCOUNTER — Encounter (HOSPITAL_COMMUNITY)
Admission: RE | Admit: 2015-12-08 | Discharge: 2015-12-08 | Disposition: A | Discharge status: Home / Self Care | Payer: Medicaid Other | Source: Ambulatory Visit | Attending: Orthopedic Surgery | Admitting: Orthopedic Surgery

## 2015-12-08 DIAGNOSIS — Z01812 Encounter for preprocedural laboratory examination: Secondary | ICD-10-CM | POA: Diagnosis present

## 2015-12-08 LAB — BASIC METABOLIC PANEL
ANION GAP: 10 (ref 5–15)
BUN: 13 mg/dL (ref 6–20)
CO2: 20 mmol/L — ABNORMAL LOW (ref 22–32)
Calcium: 9.8 mg/dL (ref 8.9–10.3)
Chloride: 103 mmol/L (ref 101–111)
Creatinine, Ser: 0.59 mg/dL (ref 0.44–1.00)
GFR calc Af Amer: >60 mL/min (ref >60)
GLUCOSE: 148 mg/dL — AB (ref 65–99)
POTASSIUM: 4 mmol/L (ref 3.5–5.1)
Sodium: 133 mmol/L — ABNORMAL LOW (ref 135–145)

## 2015-12-09 ENCOUNTER — Ambulatory Visit (HOSPITAL_BASED_OUTPATIENT_CLINIC_OR_DEPARTMENT_OTHER): Payer: Medicaid Other | Admitting: Anesthesiology

## 2015-12-09 ENCOUNTER — Ambulatory Visit (HOSPITAL_BASED_OUTPATIENT_CLINIC_OR_DEPARTMENT_OTHER)
Admission: RE | Admit: 2015-12-09 | Discharge: 2015-12-09 | Disposition: A | Discharge status: Home / Self Care | Payer: Medicaid Other | Source: Ambulatory Visit | Attending: Orthopedic Surgery | Admitting: Orthopedic Surgery

## 2015-12-09 ENCOUNTER — Encounter (HOSPITAL_BASED_OUTPATIENT_CLINIC_OR_DEPARTMENT_OTHER): Payer: Self-pay

## 2015-12-09 ENCOUNTER — Encounter (HOSPITAL_BASED_OUTPATIENT_CLINIC_OR_DEPARTMENT_OTHER)
Admission: RE | Disposition: A | Discharge status: Home / Self Care | Payer: Self-pay | Source: Ambulatory Visit | Attending: Orthopedic Surgery

## 2015-12-09 DIAGNOSIS — I10 Essential (primary) hypertension: Secondary | ICD-10-CM | POA: Insufficient documentation

## 2015-12-09 DIAGNOSIS — F419 Anxiety disorder, unspecified: Secondary | ICD-10-CM | POA: Diagnosis not present

## 2015-12-09 DIAGNOSIS — E785 Hyperlipidemia, unspecified: Secondary | ICD-10-CM | POA: Insufficient documentation

## 2015-12-09 DIAGNOSIS — Z9049 Acquired absence of other specified parts of digestive tract: Secondary | ICD-10-CM | POA: Diagnosis not present

## 2015-12-09 DIAGNOSIS — Z882 Allergy status to sulfonamides status: Secondary | ICD-10-CM | POA: Diagnosis not present

## 2015-12-09 DIAGNOSIS — Z6841 Body Mass Index (BMI) 40.0 and over, adult: Secondary | ICD-10-CM | POA: Diagnosis not present

## 2015-12-09 DIAGNOSIS — S52501A Unspecified fracture of the lower end of right radius, initial encounter for closed fracture: Secondary | ICD-10-CM | POA: Diagnosis not present

## 2015-12-09 DIAGNOSIS — Z87442 Personal history of urinary calculi: Secondary | ICD-10-CM | POA: Insufficient documentation

## 2015-12-09 DIAGNOSIS — S52601A Unspecified fracture of lower end of right ulna, initial encounter for closed fracture: Secondary | ICD-10-CM | POA: Diagnosis not present

## 2015-12-09 DIAGNOSIS — F17200 Nicotine dependence, unspecified, uncomplicated: Secondary | ICD-10-CM | POA: Diagnosis not present

## 2015-12-09 DIAGNOSIS — F319 Bipolar disorder, unspecified: Secondary | ICD-10-CM | POA: Insufficient documentation

## 2015-12-09 DIAGNOSIS — Z880 Allergy status to penicillin: Secondary | ICD-10-CM | POA: Diagnosis not present

## 2015-12-09 DIAGNOSIS — Z79899 Other long term (current) drug therapy: Secondary | ICD-10-CM | POA: Diagnosis not present

## 2015-12-09 DIAGNOSIS — K219 Gastro-esophageal reflux disease without esophagitis: Secondary | ICD-10-CM | POA: Diagnosis not present

## 2015-12-09 DIAGNOSIS — J449 Chronic obstructive pulmonary disease, unspecified: Secondary | ICD-10-CM | POA: Insufficient documentation

## 2015-12-09 HISTORY — DX: Gastro-esophageal reflux disease without esophagitis: K21.9

## 2015-12-09 HISTORY — PX: OPEN REDUCTION INTERNAL FIXATION (ORIF) DISTAL RADIAL FRACTURE: SHX5989

## 2015-12-09 SURGERY — OPEN REDUCTION INTERNAL FIXATION (ORIF) DISTAL RADIUS FRACTURE
Anesthesia: General | Site: Arm Lower | Laterality: Right

## 2015-12-09 MED ORDER — LIDOCAINE 2% (20 MG/ML) 5 ML SYRINGE
INTRAMUSCULAR | Status: AC
  Filled 2015-12-09: qty 5

## 2015-12-09 MED ORDER — PROPOFOL 10 MG/ML IV BOLUS
INTRAVENOUS | Status: DC | PRN
  Administered 2015-12-09: 180 mg via INTRAVENOUS
  Administered 2015-12-09: 30 mg via INTRAVENOUS

## 2015-12-09 MED ORDER — BUPIVACAINE HCL (PF) 0.5 % IJ SOLN
INTRAMUSCULAR | Status: DC | PRN
  Administered 2015-12-09: 30 mL via PERINEURAL

## 2015-12-09 MED ORDER — VANCOMYCIN HCL IN DEXTROSE 500-5 MG/100ML-% IV SOLN
INTRAVENOUS | Status: AC
  Filled 2015-12-09: qty 100

## 2015-12-09 MED ORDER — SCOPOLAMINE 1 MG/3DAYS TD PT72: 1.0000 | MEDICATED_PATCH | Freq: Once | TRANSDERMAL | Status: DC | PRN

## 2015-12-09 MED ORDER — DEXAMETHASONE SODIUM PHOSPHATE 10 MG/ML IJ SOLN
INTRAMUSCULAR | Status: AC
  Filled 2015-12-09: qty 1

## 2015-12-09 MED ORDER — FENTANYL CITRATE (PF) 100 MCG/2ML IJ SOLN
50.0000 ug | INTRAMUSCULAR | Status: AC | PRN
  Administered 2015-12-09: 25 ug via INTRAVENOUS
  Administered 2015-12-09 (×2): 50 ug via INTRAVENOUS
  Administered 2015-12-09: 25 ug via INTRAVENOUS

## 2015-12-09 MED ORDER — ONDANSETRON HCL 4 MG PO TABS: 4.0000 mg | ORAL_TABLET | Freq: Three times a day (TID) | ORAL | Status: DC | PRN

## 2015-12-09 MED ORDER — CHLORHEXIDINE GLUCONATE 4 % EX LIQD: 60.0000 mL | Freq: Once | CUTANEOUS | Status: DC

## 2015-12-09 MED ORDER — VANCOMYCIN HCL IN DEXTROSE 1-5 GM/200ML-% IV SOLN
1000.0000 mg | INTRAVENOUS | Status: AC
  Administered 2015-12-09: 1000 mg via INTRAVENOUS

## 2015-12-09 MED ORDER — LIDOCAINE HCL (CARDIAC) 20 MG/ML IV SOLN
INTRAVENOUS | Status: DC | PRN
  Administered 2015-12-09: 70 mg via INTRAVENOUS

## 2015-12-09 MED ORDER — LACTATED RINGERS IV SOLN
INTRAVENOUS | Status: DC
  Administered 2015-12-09 (×2): via INTRAVENOUS

## 2015-12-09 MED ORDER — OXYCODONE-ACETAMINOPHEN 5-325 MG PO TABS: ORAL_TABLET | ORAL | Status: DC

## 2015-12-09 MED ORDER — 0.9 % SODIUM CHLORIDE (POUR BTL) OPTIME
TOPICAL | Status: DC | PRN
  Administered 2015-12-09: 240 mL

## 2015-12-09 MED ORDER — MIDAZOLAM HCL 2 MG/2ML IJ SOLN
INTRAMUSCULAR | Status: AC
  Filled 2015-12-09: qty 2

## 2015-12-09 MED ORDER — GLYCOPYRROLATE 0.2 MG/ML IJ SOLN: 0.2000 mg | Freq: Once | INTRAMUSCULAR | Status: DC | PRN

## 2015-12-09 MED ORDER — FENTANYL CITRATE (PF) 100 MCG/2ML IJ SOLN
INTRAMUSCULAR | Status: AC
  Filled 2015-12-09: qty 2

## 2015-12-09 MED ORDER — DEXAMETHASONE SODIUM PHOSPHATE 10 MG/ML IJ SOLN
INTRAMUSCULAR | Status: DC | PRN
  Administered 2015-12-09: 10 mg via INTRAVENOUS

## 2015-12-09 MED ORDER — ONDANSETRON HCL 4 MG/2ML IJ SOLN
INTRAMUSCULAR | Status: DC | PRN
  Administered 2015-12-09: 4 mg via INTRAVENOUS

## 2015-12-09 MED ORDER — ONDANSETRON HCL 4 MG/2ML IJ SOLN
INTRAMUSCULAR | Status: AC
  Filled 2015-12-09: qty 2

## 2015-12-09 MED ORDER — MIDAZOLAM HCL 2 MG/2ML IJ SOLN
1.0000 mg | INTRAMUSCULAR | Status: DC | PRN
  Administered 2015-12-09: 1 mg via INTRAVENOUS

## 2015-12-09 MED ORDER — PHENYLEPHRINE HCL 10 MG/ML IJ SOLN
INTRAMUSCULAR | Status: DC | PRN
  Administered 2015-12-09: 80 ug via INTRAVENOUS

## 2015-12-09 MED ORDER — VANCOMYCIN HCL IN DEXTROSE 1-5 GM/200ML-% IV SOLN
INTRAVENOUS | Status: AC
  Filled 2015-12-09: qty 200

## 2015-12-09 SURGICAL SUPPLY — 76 items
BANDAGE ACE 3X5.8 VEL STRL LF (GAUZE/BANDAGES/DRESSINGS) ×3 IMPLANT
BANDAGE ACE 4X5 VEL STRL LF (GAUZE/BANDAGES/DRESSINGS) ×2 IMPLANT
BIT DRILL 2.0 LNG QUCK RELEASE (BIT) IMPLANT
BIT DRILL 2.8X5 QR DISP (BIT) ×2 IMPLANT
BLADE SURG 15 STRL LF DISP TIS (BLADE) ×2 IMPLANT
BLADE SURG 15 STRL SS (BLADE) ×6
BNDG CMPR 9X4 STRL LF SNTH (GAUZE/BANDAGES/DRESSINGS) ×1
BNDG ESMARK 4X9 LF (GAUZE/BANDAGES/DRESSINGS) ×3 IMPLANT
BNDG GAUZE ELAST 4 BULKY (GAUZE/BANDAGES/DRESSINGS) ×3 IMPLANT
BNDG PLASTER X FAST 3X3 WHT LF (CAST SUPPLIES) ×60 IMPLANT
BNDG PLSTR 9X3 FST ST WHT (CAST SUPPLIES) ×30
CHLORAPREP W/TINT 26ML (MISCELLANEOUS) ×3 IMPLANT
CORDS BIPOLAR (ELECTRODE) ×3 IMPLANT
COVER BACK TABLE 60X90IN (DRAPES) ×3 IMPLANT
COVER MAYO STAND STRL (DRAPES) ×3 IMPLANT
DRAPE EXTREMITY T 121X128X90 (DRAPE) ×3 IMPLANT
DRAPE OEC MINIVIEW 54X84 (DRAPES) ×3 IMPLANT
DRAPE SURG 17X23 STRL (DRAPES) ×3 IMPLANT
DRILL 2.0 LNG QUICK RELEASE (BIT) ×3
GAUZE SPONGE 4X4 12PLY STRL (GAUZE/BANDAGES/DRESSINGS) ×3 IMPLANT
GAUZE XEROFORM 1X8 LF (GAUZE/BANDAGES/DRESSINGS) ×3 IMPLANT
GLOVE BIO SURGEON STRL SZ7.5 (GLOVE) ×3 IMPLANT
GLOVE BIOGEL PI IND STRL 7.0 (GLOVE) IMPLANT
GLOVE BIOGEL PI IND STRL 8 (GLOVE) ×1 IMPLANT
GLOVE BIOGEL PI IND STRL 8.5 (GLOVE) IMPLANT
GLOVE BIOGEL PI INDICATOR 7.0 (GLOVE) ×4
GLOVE BIOGEL PI INDICATOR 8 (GLOVE) ×2
GLOVE BIOGEL PI INDICATOR 8.5 (GLOVE) ×2
GLOVE ECLIPSE 6.5 STRL STRAW (GLOVE) ×2 IMPLANT
GLOVE SURG ORTHO 8.0 STRL STRW (GLOVE) ×2 IMPLANT
GOWN STRL REUS W/ TWL LRG LVL3 (GOWN DISPOSABLE) ×1 IMPLANT
GOWN STRL REUS W/TWL LRG LVL3 (GOWN DISPOSABLE) ×3
GOWN STRL REUS W/TWL XL LVL3 (GOWN DISPOSABLE) ×5 IMPLANT
GUIDEWIRE ORTHO 0.054X6 (WIRE) ×6 IMPLANT
NDL HYPO 25X1 1.5 SAFETY (NEEDLE) IMPLANT
NEEDLE HYPO 25X1 1.5 SAFETY (NEEDLE) IMPLANT
NS IRRIG 1000ML POUR BTL (IV SOLUTION) ×3 IMPLANT
PACK BASIN DAY SURGERY FS (CUSTOM PROCEDURE TRAY) ×3 IMPLANT
PAD CAST 3X4 CTTN HI CHSV (CAST SUPPLIES) ×1 IMPLANT
PADDING CAST ABS 4INX4YD NS (CAST SUPPLIES) ×2
PADDING CAST ABS COTTON 4X4 ST (CAST SUPPLIES) ×1 IMPLANT
PADDING CAST COTTON 3X4 STRL (CAST SUPPLIES) ×3
PLATE R NARROW PROC VDR (Plate) ×2 IMPLANT
SCREW BN FT 16X2.3XLCK HEX CRT (Screw) IMPLANT
SCREW CORT FT 18X2.3XLCK HEX (Screw) IMPLANT
SCREW CORTICAL LOCKING 2.3X16M (Screw) ×6 IMPLANT
SCREW CORTICAL LOCKING 2.3X18M (Screw) ×12 IMPLANT
SCREW CORTICAL LOCKING 2.3X20M (Screw) ×3 IMPLANT
SCREW FX16X2.3XLCK SMTH NS CRT (Screw) IMPLANT
SCREW FX18X2.3XSMTH LCK NS CRT (Screw) IMPLANT
SCREW FX20X2.3XSMTH LCK NS CRT (Screw) IMPLANT
SCREW HEX 3.5X15 NLCKG STRL (Screw) IMPLANT
SCREW HEX 3.5X15MM (Screw) ×3 IMPLANT
SCREW NLCKG 13 3.5X13 HEXA (Screw) IMPLANT
SCREW NON TOGG 2.3X20MM (Screw) ×2 IMPLANT
SCREW NON-LOCK 3.5X13 (Screw) ×3 IMPLANT
SCREW NONLOCK HEX 3.5X12 (Screw) ×2 IMPLANT
SLEEVE SCD COMPRESS KNEE MED (MISCELLANEOUS) ×2 IMPLANT
SLING ARM FOAM STRAP LRG (SOFTGOODS) ×2 IMPLANT
STOCKINETTE 4X48 STRL (DRAPES) ×3 IMPLANT
SUCTION FRAZIER HANDLE 10FR (MISCELLANEOUS)
SUCTION TUBE FRAZIER 10FR DISP (MISCELLANEOUS) IMPLANT
SUT ETHILON 3 0 PS 1 (SUTURE) IMPLANT
SUT ETHILON 4 0 PS 2 18 (SUTURE) ×5 IMPLANT
SUT VIC AB 2-0 SH 27 (SUTURE)
SUT VIC AB 2-0 SH 27XBRD (SUTURE) IMPLANT
SUT VIC AB 3-0 PS1 18 (SUTURE)
SUT VIC AB 3-0 PS1 18XBRD (SUTURE) IMPLANT
SUT VICRYL 4-0 PS2 18IN ABS (SUTURE) ×3 IMPLANT
SYR BULB 3OZ (MISCELLANEOUS) ×3 IMPLANT
SYR CONTROL 10ML LL (SYRINGE) IMPLANT
TOWEL OR 17X24 6PK STRL BLUE (TOWEL DISPOSABLE) ×6 IMPLANT
TOWEL OR NON WOVEN STRL DISP B (DISPOSABLE) ×3 IMPLANT
TUBE CONNECTING 20'X1/4 (TUBING)
TUBE CONNECTING 20X1/4 (TUBING) IMPLANT
UNDERPAD 30X30 (UNDERPADS AND DIAPERS) ×3 IMPLANT

## 2015-12-09 NOTE — Transfer of Care (Signed)
Immediate Anesthesia Transfer of Care Note  Patient: [name]  Procedure(s) Performed: Procedure(s): OPEN REDUCTION INTERNAL FIXATION (ORIF) RIGHT DISTAL RADIUS (Right)  Patient Location: PACU  Anesthesia Type:GA combined with regional for post-op pain  Level of Consciousness: awake, alert  and oriented  Airway & Oxygen Therapy: Patient Spontanous Breathing and Patient connected to face mask oxygen  Post-op Assessment: Report given to RN and Post -op Vital signs reviewed and stable  Post vital signs: Reviewed and stable  Last Vitals:  Filed Vitals:   12/09/15 1420 12/09/15 1421  BP: 118/72   Pulse: 81 86  Temp:    Resp: 15 26    Last Pain:  Filed Vitals:   12/09/15 1421  PainSc: 8       Patients Stated Pain Goal: 2 (27/03/50 0938)  Complications: No apparent anesthesia complications

## 2015-12-09 NOTE — Anesthesia Preprocedure Evaluation (Signed)
Anesthesia Evaluation  Patient identified by MRN, date of birth, ID band Patient awake    Reviewed: Allergy & Precautions, NPO status , Patient's Chart, lab work & pertinent test results  Airway Mallampati: II  TM Distance: <3 FB Neck ROM: Full    Dental no notable dental hx.    Pulmonary COPD, Current Smoker,    Pulmonary exam normal breath sounds clear to auscultation       Cardiovascular hypertension, Pt. on medications Normal cardiovascular exam Rhythm:Regular Rate:Normal     Neuro/Psych Bipolar Disorder negative neurological ROS  negative psych ROS   GI/Hepatic negative GI ROS, (+)     substance abuse  alcohol use,   Endo/Other  Morbid obesity  Renal/GU negative Renal ROS  negative genitourinary   Musculoskeletal negative musculoskeletal ROS (+)   Abdominal   Peds negative pediatric ROS (+)  Hematology negative hematology ROS (+)   Anesthesia Other Findings   Reproductive/Obstetrics negative OB ROS                             Anesthesia Physical Anesthesia Plan  ASA: III  Anesthesia Plan: General   Post-op Pain Management: GA combined w/ Regional for post-op pain   Induction: Intravenous  Airway Management Planned: LMA  Additional Equipment:   Intra-op Plan:   Post-operative Plan: Extubation in OR  Informed Consent: I have reviewed the patients History and Physical, chart, labs and discussed the procedure including the risks, benefits and alternatives for the proposed anesthesia with the patient or authorized representative who has indicated his/her understanding and acceptance.   Dental advisory given  Plan Discussed with: CRNA and Surgeon  Anesthesia Plan Comments:         Anesthesia Quick Evaluation

## 2015-12-09 NOTE — Progress Notes (Signed)
Assisted Dr. Kalman Shan with right, ultrasound guided, supraclavicular block. Side rails up, monitors on throughout procedure. See vital signs in flow sheet. Tolerated Procedure well.

## 2015-12-09 NOTE — H&P (Signed)
[name] is an 60 y.o. female.   Chief Complaint: right distal radius and ulna fractures HPI: 60 yo rhd female states she fell on her steps 11/28/15 injuring right wrist.  Seen in ED where XR revealed right distal radius and ulna fractures.  Splinted and followed up in office.  She reports no previous injury to right arm and no other injury at this time.   Allergies:  Allergies  Allergen Reactions  . Penicillins Other (See Comments)    Patient is unsure if she allergic to penicillin or septra. Patient states one or another caused "rib pain with a little breathing problem". Has patient had a PCN reaction causing immediate rash, facial/tongue/throat swelling, SOB or lightheadedness with hypotension: YES Has patient had a PCN reaction causing severe rash involving mucus membranes or skin necrosis: NO Has patient had a PCN reaction that required hospitalization: NO Has patient had a PCN reaction occurring within the last 10 years: NO  . Septra [Sulfamethoxazole-Trimethoprim] Other (See Comments)    Patient is unsure if she allergic to septra or penicillin. Patient states one or another caused "rib pain with a little breathing problem".    Past Medical History  Diagnosis Date  . COPD (chronic obstructive pulmonary disease) ([place])   . Hypertension   . Bipolar 1 disorder ([place])   . Anxiety   . Depression   . Vaginal Pap smear, abnormal   . Emphysema of lung ([place])   . Hyperlipidemia   . GERD (gastroesophageal reflux disease)     Past Surgical History  Procedure Laterality Date  . Kidney stone surgery    . Cholecystectomy    . Hernia repair      Dr. Arnoldo Morale  . Colonoscopy  July 2010    Dr. Arnoldo Morale: 3 rectal polyps, not enough tissue for pathologic examination, recommended surveillance in 3 years  . Esophagogastroduodenoscopy  July 2010    Dr. Arnoldo Morale: gastritis and duodenitis, H.pylori negative  . Colonoscopy N/A 10/23/2014    RMR: Multiple colonic polyps removed as  described above. No endoscopic explaniation for abdominal pain. however. next tcs 10/2019  . Esophagogastroduodenoscopy N/A 10/23/2014    RMR: Normal EGD. Status post passage of a Maloney dilator. Today's finding s would not explain abdominal pain  . Maloney dilation N/A 10/23/2014    Procedure: Venia Minks DILATION;  Surgeon: Daneil Dolin, MD;  Location: [facility];  Service: Endoscopy;  Laterality: N/A;    Family History: Family History  Problem Relation Age of Onset  . Adopted: Yes    Social History:   reports that she has been smoking Cigarettes.  She has a 21 pack-year smoking history. She has never used smokeless tobacco. She reports that she drinks about 0.6 oz of alcohol per week. She reports that she does not use illicit drugs.  Medications: Medications Prior to Admission  Medication Sig Dispense Refill  . albuterol (PROVENTIL) (2.5 MG/3ML) 0.083% nebulizer solution Take 2.5 mg by nebulization every 6 (six) hours as needed for wheezing or shortness of breath.    Marland Kitchen albuterol-ipratropium (COMBIVENT) 18-103 MCG/ACT inhaler Inhale 2 puffs into the lungs every 6 (six) hours as needed for shortness of breath.     . budesonide-formoterol (SYMBICORT) 160-4.5 MCG/ACT inhaler Inhale 2 puffs into the lungs 2 (two) times daily.    Marland Kitchen DEXILANT 60 MG capsule TAKE 1 CAPSULE BY MOUTH ONCE DAILY. 30 capsule 5  . fluticasone (FLONASE) 50 MCG/ACT nasal spray Place 1 spray into both nostrils 2 (two) times daily  as needed for allergies or rhinitis. 16 g 6  . furosemide (LASIX) 40 MG tablet Take 1 tablet (40 mg total) by mouth daily. (Patient taking differently: Take 20 mg by mouth daily. ) 30 tablet 3  . ibuprofen (ADVIL,MOTRIN) 800 MG tablet Take 800 mg by mouth every 6 (six) hours as needed for moderate pain.    Marland Kitchen lisinopril (PRINIVIL,ZESTRIL) 20 MG tablet Take 1 tablet (20 mg total) by mouth daily. 30 tablet 3  . oxyCODONE-acetaminophen (PERCOCET/ROXICET) 5-325 MG tablet Take 1 tablet by mouth every 4  (four) hours as needed. 20 tablet 0  . promethazine (PHENERGAN) 25 MG tablet Take 25 mg by mouth every 6 (six) hours as needed for nausea or vomiting.    Marland Kitchen rOPINIRole (REQUIP) 1 MG tablet TAKE 1 TABLET BY MOUTH 3 TIMES DAILY. 90 tablet 1  . theophylline (THEO-24) 200 MG 24 hr capsule Take 200 mg by mouth daily.    Marland Kitchen tiotropium (SPIRIVA) 18 MCG inhalation capsule Place 18 mcg into inhaler and inhale daily.    . nitroGLYCERIN (NITROSTAT) 0.4 MG SL tablet DISSOLVE 1 TABLET UNDER THE TONGUE EVERY 5 MINUTES IF NEEDED FOR CHEST PAIN. MAX 3 DOSES THEN CALL 911. 25 tablet 3    Results for orders placed or performed during the hospital encounter of 12/08/15 (from the past 48 hour(s))  Basic metabolic panel     Status: Abnormal   Collection Time: 12/08/15  2:35 PM  Result Value Ref Range   Sodium 133 (L) 135 - 145 mmol/L   Potassium 4.0 3.5 - 5.1 mmol/L   Chloride 103 101 - 111 mmol/L   CO2 20 (L) 22 - 32 mmol/L   Glucose, Bld 148 (H) 65 - 99 mg/dL   BUN 13 6 - 20 mg/dL   Creatinine, Ser 0.59 0.44 - 1.00 mg/dL   Calcium 9.8 8.9 - 10.3 mg/dL   GFR calc non Af Amer >60 >60 mL/min   GFR calc Af Amer >60 >60 mL/min    Comment: (NOTE) The eGFR has been calculated using the CKD EPI equation. This calculation has not been validated in all clinical situations. eGFR's persistently <60 mL/min signify possible Chronic Kidney Disease.    Anion gap 10 5 - 15    No results found.   A comprehensive review of systems was negative except for: Constitutional: positive for night sweats Gastrointestinal: positive for nausea Neurological: positive for headaches  Blood pressure 131/86, pulse 92, temperature 98.2 F (36.8 C), temperature source Oral, resp. rate 16, height '5\' 4"'  (1.626 m), weight 106.142 kg (234 lb), SpO2 93 %.  General appearance: alert, cooperative and appears stated age Head: Normocephalic, without obvious abnormality, atraumatic Neck: supple, symmetrical, trachea midline Resp: clear to  auscultation bilaterally Cardio: regular rate and rhythm GI: non-tender Extremities: Intact sensation and capillary refill all digits.  +epl/fpl/io.  No wounds.  Pulses: 2+ and symmetric Skin: Skin color, texture, turgor normal. No rashes or lesions Neurologic: Grossly normal Incision/Wound:none  Assessment/Plan Right distal radius and ulna fractures.  Non operative and operative treatment options were discussed with the patient and patient wishes to proceed with operative treatment. Risks, benefits, and alternatives of surgery were discussed and the patient agrees with the plan of care.   Rudine Rieger R 12/09/2015, 1:25 PM

## 2015-12-09 NOTE — Anesthesia Postprocedure Evaluation (Signed)
Anesthesia Post Note  Patient: [name]  Procedure(s) Performed: Procedure(s) (LRB): OPEN REDUCTION INTERNAL FIXATION (ORIF) RIGHT DISTAL RADIUS (Right)  Patient location during evaluation: PACU Anesthesia Type: General and Regional Level of consciousness: awake and alert Pain management: pain level controlled Vital Signs Assessment: post-procedure vital signs reviewed and stable Respiratory status: spontaneous breathing, nonlabored ventilation, respiratory function stable and patient connected to nasal cannula oxygen Cardiovascular status: blood pressure returned to baseline and stable Postop Assessment: no signs of nausea or vomiting Anesthetic complications: no    Last Vitals:  Filed Vitals:   12/09/15 1622 12/09/15 1630  BP: 157/74 141/84  Pulse: 136 97  Temp: 36.4 C   Resp: 25 27    Last Pain:  Filed Vitals:   12/09/15 1631  PainSc: 0-No pain                 [name] S

## 2015-12-09 NOTE — Anesthesia Procedure Notes (Addendum)
Anesthesia Regional Block:  Supraclavicular block  Pre-Anesthetic Checklist: ,, timeout performed, Correct Patient, Correct Site, Correct Laterality, Correct Procedure, Correct Position, site marked, Risks and benefits discussed,  Surgical consent,  Pre-op evaluation,  At surgeon's request and post-op pain management  Laterality: Right  Prep: chloraprep       Needles:  Injection technique: Single-shot  Needle Type: Echogenic Stimulator Needle     Needle Length: 9cm 9 cm Needle Gauge: 21 and 21 G    Additional Needles:  Procedures: ultrasound guided (picture in chart) Supraclavicular block Narrative:  Injection made incrementally with aspirations every 5 mL.  Performed by: Personally  Anesthesiologist: ROSE, GEORGE  Additional Notes: Patient tolerated the procedure well without complications   Procedure Name: LMA Insertion Date/Time: 12/09/2015 3:07 PM Performed by: Baxter Flattery Pre-anesthesia Checklist: Patient identified, Emergency Drugs available, Suction available and Patient being monitored Patient Re-evaluated:Patient Re-evaluated prior to inductionOxygen Delivery Method: Circle system utilized Preoxygenation: Pre-oxygenation with 100% oxygen Intubation Type: IV induction Ventilation: Mask ventilation without difficulty LMA: LMA inserted LMA Size: 4.0 Number of attempts: 1 Airway Equipment and Method: Bite block Placement Confirmation: positive ETCO2 and breath sounds checked- equal and bilateral Tube secured with: Tape Dental Injury: Teeth and Oropharynx as per pre-operative assessment

## 2015-12-09 NOTE — Discharge Instructions (Addendum)

## 2015-12-09 NOTE — Brief Op Note (Signed)
12/09/2015  4:18 PM  PATIENT:  [name]  60 y.o. female  PRE-OPERATIVE DIAGNOSIS:  RIGHT DISTAL RADIUS AND ULNA FRACTURES  POST-OPERATIVE DIAGNOSIS:  RIGHT DISTAL RADIUS AND ULNA FRACTURES  PROCEDURE:  Procedure(s): OPEN REDUCTION INTERNAL FIXATION (ORIF) RIGHT DISTAL RADIUS (Right)  SURGEON:  Surgeon(s) and Role:    * Leanora Cover, MD - Primary    * Daryll Brod, MD - Assisting  PHYSICIAN ASSISTANT:   ASSISTANTS: Daryll Brod, MD   ANESTHESIA:   regional and general  EBL:  Total I/O In: 1000 [I.V.:1000] Out: -   BLOOD ADMINISTERED:none  DRAINS: none   LOCAL MEDICATIONS USED:  NONE  SPECIMEN:  No Specimen  DISPOSITION OF SPECIMEN:  N/A  COUNTS:  YES  TOURNIQUET:   Total Tourniquet Time Documented: Upper Arm (Right) - 51 minutes Total: Upper Arm (Right) - 51 minutes   DICTATION: .Other Dictation: Dictation Number [id number]  PLAN OF CARE: Discharge to home after PACU  PATIENT DISPOSITION:  PACU - hemodynamically stable.

## 2015-12-09 NOTE — Op Note (Signed)
[id number] 

## 2015-12-09 NOTE — Op Note (Signed)
I assisted Surgeon(s) and Role:    * Leanora Cover, MD - Primary    * Daryll Brod, MD - Assisting on the Procedure(s): OPEN REDUCTION INTERNAL FIXATION (ORIF) RIGHT DISTAL RADIUS on 12/09/2015.  I provided assistance on this case as follows: approach, retraction, reduction, stabilization of the fracture for application of the plate, aided internal fixation, radiographic interpretation, closure of the incision, application of the dressing and splint. Iwas present for the entire case.  Electronically signed by: Wynonia Sours, MD Date: 12/09/2015 Time: 4:18 PM

## 2015-12-10 ENCOUNTER — Encounter (HOSPITAL_BASED_OUTPATIENT_CLINIC_OR_DEPARTMENT_OTHER): Payer: Self-pay | Admitting: Orthopedic Surgery

## 2015-12-10 DIAGNOSIS — S52501A Unspecified fracture of the lower end of right radius, initial encounter for closed fracture: Secondary | ICD-10-CM | POA: Diagnosis not present

## 2015-12-10 NOTE — Op Note (Signed)
NAME:  [name], DILLOW NO.:  [id number]  MEDICAL RECORD NO.:  [id number]  LOCATION:                                 FACILITY:  PHYSICIAN:  Leanora Cover, MD        DATE OF BIRTH:  June 24, 1956  DATE OF PROCEDURE:  12/09/2015 DATE OF DISCHARGE:                              OPERATIVE REPORT   PREOPERATIVE DIAGNOSES:  Right distal radius fracture and distal ulnar fracture.  POSTOPERATIVE DIAGNOSES:  Right distal radius fracture and distal ulnar fracture.  PROCEDURE:   1. Open reduction and internal fixation of right extraarticular distal radius Fracture 2. Closed treatment of distal ulnar metaphyseal fracture without manipulation  SURGEON:  Leanora Cover, MD.  ASSISTANT:  Daryll Brod, MD.  ANESTHESIA:  General with regional.  IV FLUIDS:  Per anesthesia flow sheet.  ESTIMATED BLOOD LOSS:  Minimal.  COMPLICATIONS:  None.  SPECIMENS:  None.  TOURNIQUET TIME:  51 minutes.  DISPOSITION:  Stable to PACU.  INDICATIONS:  Ms. [name] is a 60 year old female, who on Nov 28, 2015, fell on her steps, injuring her right wrist.  She was seen at the emergency department, where radiographs were taken revealing distal radius and ulnar fracture.  She was splinted and followed up in the office.  We discussed nonoperative and operative treatment options.  I recommended operative fixation.  Risks, benefits, and alternatives of surgery were discussed including the risk of blood loss; infection; damage to nerves, vessels, tendons, ligaments, bone; failure of surgery; need for additional surgery; complications with wound healing; continued pain; nonunion; malunion; stiffness.  She voiced understanding of these risks and elected to proceed.  OPERATIVE COURSE:  After being identified preoperatively by myself, the patient and I agreed upon the procedure and site of procedure.  Surgical site was marked.  The risks, benefits, and alternatives of surgery were reviewed and she  wished to proceed.  Surgical consent had been signed. She was given IV vancomycin as preoperative antibiotic prophylaxis due to penicillin allergy.  A regional block was performed by Anesthesia in preoperative holding.  She was transferred to the operating room and placed on the operating room table in supine position with the right upper extremity on an arm board.  General anesthesia was induced by Anesthesiology.  Right upper extremity was prepped and draped in normal sterile orthopedic fashion.  Surgical pause was performed between surgeons, anesthesia, operating room staff, and all were in agreement as to the patient, procedure, and site of procedure.  Tourniquet at the proximal aspect of the extremity was inflated to 250 mmHg after exsanguination of the limb with an Esmarch bandage.  A standard volar Mallie Mussel approach was used.  Bipolar electrocautery was used to obtain hemostasis.  The superficial and deep portions of the FCR tendon sheath were incised, and the FCR and FPL swept ulnarly to protect the palmar cutaneous branch of the median nerve.  The brachioradialis was released at the radial side of the radius.  The pronator quadratus was released and elevated.  The fracture site was identified.  It was cleared of soft tissue interposition.  It was reduced under direct visualization.  An Acumed volar distal radial locking plate was selected and secured to the  bone with guide pins.  C-arm was used in AP and lateral projections to ensure appropriate reduction and positioning of hardware, which was the case.  Standard AO drilling and measuring technique was used.  A single screw was placed in the slotted hole in the shaft of the plate.  The distal screw holes were filled with initially a nonlocking screw to bring the bone up to the plate.  The distal holes were filled with locking pegs with the exception of the styloid holes, which were filled with locking screws.  The nonlocking screws  were exchanged for a locking peg.  The remaining holes in the shaft of the plate were filled with nonlocking screws.  Acceptable purchase was obtained.  C-arm was used in AP, lateral, and oblique projections to ensure appropriate reduction and positioning of hardware, which was the case.  The distal ulnar fracture appeared stable without displacement.  This is a metaphyseal fracture, not an ulnar styloid fracture.  The wound was copiously irrigated with sterile saline.  Pronator quadratus was repaired back over top of the plate using 4-0 Vicryl suture.  An inverted interrupted Vicryl sutures were placed in subcutaneous tissues and skin was closed with 4-0 nylon in a horizontal mattress fashion.  The wound was dressed with sterile Xeroform, 4x4s, and wrapped with a Kerlix bandage.  A sugar-tong splint was placed and wrapped with Kerlix and Ace bandage.  Tourniquet was deflated at 51 minutes.  Fingertips were pink with brisk capillary refill after deflation of tourniquet.  Operative drapes were broken down.  The patient was awoken from anesthesia safely.  She was transferred back to stretcher and taken to PACU in stable condition.  I will see her back in the office in 1 week for postoperative followup.  I will give her Percocet 5/325, 1-2 p.o. q.6 hours p.r.n. pain, dispensed #30.     Leanora Cover, MD     KK/MEDQ  D:  12/09/2015  T:  12/10/2015  Job:  [id number]

## 2015-12-11 DIAGNOSIS — R296 Repeated falls: Secondary | ICD-10-CM | POA: Insufficient documentation

## 2015-12-11 DIAGNOSIS — R8769 Abnormal cytological findings in specimens from other female genital organs: Secondary | ICD-10-CM | POA: Insufficient documentation

## 2015-12-16 ENCOUNTER — Other Ambulatory Visit: Payer: Self-pay | Admitting: Gastroenterology

## 2015-12-16 DIAGNOSIS — S52501A Unspecified fracture of the lower end of right radius, initial encounter for closed fracture: Secondary | ICD-10-CM | POA: Insufficient documentation

## 2015-12-25 ENCOUNTER — Other Ambulatory Visit: Payer: Self-pay | Admitting: Gastroenterology

## 2016-01-02 ENCOUNTER — Other Ambulatory Visit: Payer: Self-pay | Admitting: Gastroenterology

## 2016-01-14 ENCOUNTER — Ambulatory Visit (HOSPITAL_COMMUNITY): Admission: RE | Admit: 2016-01-14 | Payer: Medicaid Other | Source: Ambulatory Visit

## 2016-01-15 ENCOUNTER — Telehealth: Payer: Self-pay | Admitting: Internal Medicine

## 2016-01-15 NOTE — Telephone Encounter (Signed)
Routing to the refill box. 

## 2016-01-15 NOTE — Telephone Encounter (Signed)
Pt called to see if we would call in a refill for her phenergan to [facility].

## 2016-01-16 MED ORDER — PROMETHAZINE HCL 25 MG PO TABS: ORAL_TABLET | ORAL | Status: DC

## 2016-01-16 NOTE — Addendum Note (Signed)
Addended by: Mahala Menghini on: 01/16/2016 12:51 PM   Modules accepted: Orders

## 2016-01-16 NOTE — Telephone Encounter (Signed)
done

## 2016-01-19 ENCOUNTER — Other Ambulatory Visit: Payer: Self-pay | Admitting: Family Medicine

## 2016-02-03 ENCOUNTER — Other Ambulatory Visit: Payer: Self-pay | Admitting: Gastroenterology

## 2016-02-12 ENCOUNTER — Other Ambulatory Visit: Payer: Self-pay | Admitting: Nurse Practitioner

## 2016-02-18 ENCOUNTER — Telehealth: Payer: Self-pay | Admitting: Obstetrics and Gynecology

## 2016-02-18 NOTE — Telephone Encounter (Signed)
Pt states that she saw Dr.Ferguson a while back and discussed orgasms and climaxing. Pt states that she tried everything that Dr. Glo Herring told her to but she is still having trouble with this problem. Pt wants to know if there is anything she could take to help with this.   Pt was advised to come in and discuss these issues with Dr. Glo Herring, pt verbalized understanding.

## 2016-02-18 NOTE — Telephone Encounter (Signed)
Pt called stating that she would like to speak with Dr. Johnnye Sima nurse regarding a personal issue, please contact pt

## 2016-02-20 ENCOUNTER — Ambulatory Visit: Payer: Self-pay | Admitting: Obstetrics and Gynecology

## 2016-02-24 ENCOUNTER — Ambulatory Visit: Payer: Self-pay | Admitting: Obstetrics and Gynecology

## 2016-02-25 ENCOUNTER — Ambulatory Visit (HOSPITAL_COMMUNITY): Admission: RE | Admit: 2016-02-25 | Payer: Medicaid Other | Source: Ambulatory Visit

## 2016-02-25 ENCOUNTER — Encounter (HOSPITAL_COMMUNITY): Payer: Medicaid Other

## 2016-02-26 ENCOUNTER — Telehealth: Payer: Self-pay

## 2016-02-26 NOTE — Telephone Encounter (Signed)
Pt is calling because she is still hurting and the medication is not working. She has an appointment on 03/16/16 @ 8:00 with EG. Please advise

## 2016-03-01 ENCOUNTER — Other Ambulatory Visit: Payer: Self-pay | Admitting: Nurse Practitioner

## 2016-03-01 ENCOUNTER — Other Ambulatory Visit: Payer: Self-pay | Admitting: Gastroenterology

## 2016-03-01 DIAGNOSIS — R1013 Epigastric pain: Secondary | ICD-10-CM | POA: Insufficient documentation

## 2016-03-02 ENCOUNTER — Other Ambulatory Visit (HOSPITAL_COMMUNITY): Payer: Self-pay | Admitting: Internal Medicine

## 2016-03-02 ENCOUNTER — Telehealth: Payer: Self-pay | Admitting: Obstetrics and Gynecology

## 2016-03-02 DIAGNOSIS — R110 Nausea: Secondary | ICD-10-CM

## 2016-03-02 DIAGNOSIS — R1013 Epigastric pain: Secondary | ICD-10-CM

## 2016-03-02 NOTE — Telephone Encounter (Signed)
Pt called stating that she needs a refill of a cream that helps her have an orgasm. Please contact pt

## 2016-03-02 NOTE — Telephone Encounter (Signed)
Pt informed did not see where Dr. Glo Herring had prescribed a med/cream for orgasms. Pt informed will need to keep her appt for 03/04/2016. Pt verbalized understanding.

## 2016-03-02 NOTE — Telephone Encounter (Signed)
Last refill without being seen 

## 2016-03-04 ENCOUNTER — Ambulatory Visit: Payer: Self-pay | Admitting: Obstetrics and Gynecology

## 2016-03-05 ENCOUNTER — Ambulatory Visit (HOSPITAL_COMMUNITY): Admission: RE | Admit: 2016-03-05 | Payer: Medicaid Other | Source: Ambulatory Visit

## 2016-03-10 NOTE — Telephone Encounter (Signed)
I cannot make any over the phone recommendations because we haven't seen her since May 2015. If her symptoms are severe (severe pain, uncontrollable N/V unable to keep down food/fluids) she should be evaluated by the ER.

## 2016-03-10 NOTE — Telephone Encounter (Signed)
Tried to call with no answer  

## 2016-03-15 ENCOUNTER — Other Ambulatory Visit: Payer: Self-pay | Admitting: Nurse Practitioner

## 2016-03-15 ENCOUNTER — Other Ambulatory Visit: Payer: Self-pay

## 2016-03-16 ENCOUNTER — Encounter: Payer: Self-pay | Admitting: Nurse Practitioner

## 2016-03-16 ENCOUNTER — Ambulatory Visit: Payer: Medicaid Other | Admitting: Nurse Practitioner

## 2016-03-16 ENCOUNTER — Telehealth: Payer: Self-pay | Admitting: Nurse Practitioner

## 2016-03-16 NOTE — Telephone Encounter (Signed)
PATIENT WAS A NO SHOW AND LETTER SENT  °

## 2016-03-17 ENCOUNTER — Telehealth: Payer: Self-pay | Admitting: Internal Medicine

## 2016-03-17 DIAGNOSIS — K219 Gastro-esophageal reflux disease without esophagitis: Secondary | ICD-10-CM

## 2016-03-17 MED ORDER — SUCRALFATE 1 GM/10ML PO SUSP: 1.0000 g | Freq: Three times a day (TID) | ORAL | 1 refills | Status: DC

## 2016-03-17 MED ORDER — PROMETHAZINE HCL 25 MG PO TABS: ORAL_TABLET | ORAL | 0 refills | Status: DC

## 2016-03-17 MED ORDER — LIDOCAINE VISCOUS 2 % MT SOLN: 15.0000 mL | Freq: Four times a day (QID) | OROMUCOSAL | 1 refills | Status: DC | PRN

## 2016-03-17 NOTE — Telephone Encounter (Signed)
Pt called to say that [facility] hasn't received anything from [place] for her to get her refills. I told her on 8/30 her carafate, dexilant and lidocaine had been called in, but the pharmacy is saying they haven't received it. She is also asking for a phenergan prescription.

## 2016-03-17 NOTE — Telephone Encounter (Signed)
Noted  

## 2016-03-17 NOTE — Telephone Encounter (Signed)
RF X1. Patient needs OV prior to further refills. No showed this week.

## 2016-03-17 NOTE — Telephone Encounter (Signed)
Pt had an office visit yesterday and no showed for it. rx's for dexilant, carafate and lidocaine were sent in on 03/03/16 with no refills. I spoke with Nicki Reaper at [facility] and he said the patient picked those rx's up that were sent in on 03/03/16, but she had called them and asked for the lidocaine and carafate rx's. Routing to the refill box. Does pt need another ov prior to further refills?

## 2016-03-17 NOTE — Telephone Encounter (Addendum)
I sent in refills of carafate and Lidocaine

## 2016-03-17 NOTE — Addendum Note (Signed)
Addended by: Gordy Levan, ERIC A on: 03/17/2016 01:24 PM   Modules accepted: Orders

## 2016-03-18 ENCOUNTER — Ambulatory Visit (HOSPITAL_COMMUNITY): Payer: Medicaid Other

## 2016-03-18 NOTE — Telephone Encounter (Signed)
Please schedule ov.  

## 2016-03-18 NOTE — Telephone Encounter (Signed)
Pt scheduled for 10/7

## 2016-03-29 DIAGNOSIS — L570 Actinic keratosis: Secondary | ICD-10-CM | POA: Insufficient documentation

## 2016-03-30 ENCOUNTER — Ambulatory Visit (HOSPITAL_COMMUNITY): Payer: Medicaid Other

## 2016-04-05 ENCOUNTER — Other Ambulatory Visit: Payer: Self-pay

## 2016-04-07 MED ORDER — PROMETHAZINE HCL 25 MG PO TABS: ORAL_TABLET | ORAL | 0 refills | Status: DC

## 2016-04-14 ENCOUNTER — Encounter: Payer: Self-pay | Admitting: General Practice

## 2016-04-14 ENCOUNTER — Telehealth: Payer: Self-pay | Admitting: Internal Medicine

## 2016-04-14 ENCOUNTER — Ambulatory Visit: Payer: Medicaid Other | Admitting: Nurse Practitioner

## 2016-04-14 NOTE — Telephone Encounter (Signed)
Patient was a no show 04/14/16   This is the 4th no show

## 2016-04-14 NOTE — Telephone Encounter (Signed)
Noted  

## 2016-04-14 NOTE — Telephone Encounter (Signed)
Doctor-patient relationship is no longer productive.

## 2016-04-14 NOTE — Telephone Encounter (Signed)
Discharge letter mailed  

## 2016-04-16 ENCOUNTER — Ambulatory Visit (HOSPITAL_COMMUNITY): Payer: Medicaid Other

## 2016-04-29 ENCOUNTER — Ambulatory Visit (HOSPITAL_COMMUNITY): Payer: Medicaid Other

## 2016-05-09 ENCOUNTER — Other Ambulatory Visit: Payer: Self-pay | Admitting: Gastroenterology

## 2016-05-12 ENCOUNTER — Ambulatory Visit (HOSPITAL_COMMUNITY): Admission: RE | Admit: 2016-05-12 | Payer: Medicaid Other | Source: Ambulatory Visit

## 2016-05-17 ENCOUNTER — Ambulatory Visit: Payer: Self-pay | Admitting: Obstetrics and Gynecology

## 2016-05-17 ENCOUNTER — Ambulatory Visit (HOSPITAL_COMMUNITY): Admission: RE | Admit: 2016-05-17 | Payer: Medicaid Other | Source: Ambulatory Visit

## 2016-05-20 ENCOUNTER — Telehealth: Payer: Self-pay

## 2016-05-20 DIAGNOSIS — R059 Cough, unspecified: Secondary | ICD-10-CM | POA: Insufficient documentation

## 2016-05-20 DIAGNOSIS — M7122 Synovial cyst of popliteal space [Baker], left knee: Secondary | ICD-10-CM | POA: Insufficient documentation

## 2016-05-20 NOTE — Telephone Encounter (Signed)
pts discharge letter was returned unclaimed. Envelope has been copied and sent to be scanned. Pt letter has been re-mailed via [place].

## 2016-05-25 ENCOUNTER — Ambulatory Visit: Payer: Self-pay | Admitting: Obstetrics & Gynecology

## 2016-06-03 ENCOUNTER — Other Ambulatory Visit: Payer: Self-pay | Admitting: Obstetrics & Gynecology

## 2016-06-08 ENCOUNTER — Other Ambulatory Visit: Payer: Self-pay | Admitting: Obstetrics & Gynecology

## 2016-06-08 ENCOUNTER — Telehealth: Payer: Self-pay | Admitting: Obstetrics & Gynecology

## 2016-06-08 ENCOUNTER — Ambulatory Visit: Payer: Self-pay | Admitting: Obstetrics & Gynecology

## 2016-06-08 NOTE — Telephone Encounter (Signed)
Pt states she was on two different hormone pills, one she took at night and one in the morning.  She can not remember the name of the medication, informed her that the Provera was sent to pharmacy, she is going to call [place] and have them send [place] a refill request for the other medication she was on.

## 2016-06-08 NOTE — Telephone Encounter (Signed)
Pt requesting refill Estradiol tablets, she states she was taking one tablet am and one tablet pm.

## 2016-06-09 ENCOUNTER — Other Ambulatory Visit: Payer: Self-pay | Admitting: Obstetrics & Gynecology

## 2016-06-09 MED ORDER — ESTRADIOL 1 MG PO TABS: ORAL_TABLET | ORAL | 11 refills | Status: DC

## 2016-06-11 ENCOUNTER — Ambulatory Visit: Payer: Medicaid Other | Admitting: Orthopedic Surgery

## 2016-06-11 ENCOUNTER — Encounter: Payer: Self-pay | Admitting: Orthopedic Surgery

## 2016-06-15 ENCOUNTER — Encounter: Payer: Self-pay | Admitting: Orthopedic Surgery

## 2016-06-15 ENCOUNTER — Telehealth: Payer: Self-pay | Admitting: Orthopedic Surgery

## 2016-06-15 ENCOUNTER — Ambulatory Visit (INDEPENDENT_AMBULATORY_CARE_PROVIDER_SITE_OTHER): Payer: Medicaid Other

## 2016-06-15 ENCOUNTER — Ambulatory Visit (INDEPENDENT_AMBULATORY_CARE_PROVIDER_SITE_OTHER): Payer: Medicaid Other | Admitting: Orthopedic Surgery

## 2016-06-15 VITALS — BP 137/91 | HR 87 | Ht 66.0 in | Wt 225.0 lb

## 2016-06-15 DIAGNOSIS — M25562 Pain in left knee: Secondary | ICD-10-CM | POA: Diagnosis not present

## 2016-06-15 DIAGNOSIS — M170 Bilateral primary osteoarthritis of knee: Secondary | ICD-10-CM | POA: Diagnosis not present

## 2016-06-15 MED ORDER — DICLOFENAC POTASSIUM 50 MG PO TABS: 50.0000 mg | ORAL_TABLET | Freq: Two times a day (BID) | ORAL | 3 refills | Status: DC

## 2016-06-15 NOTE — Telephone Encounter (Signed)
naproxen

## 2016-06-15 NOTE — Patient Instructions (Addendum)
Weight loss Exercises  Diclofenac twice a day for pain   You have received an injection of steroids into the joint. 15% of patients will have increased pain within the 24 hours postinjection.   This is transient and will go away.   We recommend that you use ice packs on the injection site for 20 minutes every 2 hours and extra strength Tylenol 2 tablets every 8 as needed until the pain resolves.  If you continue to have pain after taking the Tylenol and using the ice please call the office for further instructions.    Generic Knee Exercises EXERCISES  Do 3 sets of 10 repetitions of each exercise daily  STRENGTHENING EXERCISES These exercises may help you when beginning to rehabilitate your injury. They may resolve your symptoms with or without further involvement from your physician, physical therapist, or athletic trainer. While completing these exercises, remember:   Muscles can gain both the endurance and the strength needed for everyday activities through controlled exercises.  Complete these exercises as instructed by your physician, physical therapist, or athletic trainer. Progress the resistance and repetitions only as guided.  You may experience muscle soreness or fatigue, but the pain or discomfort you are trying to eliminate should never worsen during these exercises. If this pain does worsen, stop and make certain you are following the directions exactly. If the pain is still present after adjustments, discontinue the exercise until you can discuss the trouble with your clinician. STRENGTH - Quadriceps, Isometrics  Lie on your back with your right / left leg extended and your opposite knee bent.  Gradually tense the muscles in the front of your right / left thigh. You should see either your knee cap slide up toward your hip or increased dimpling just above the knee. This motion will push the back of the knee down toward the floor/mat/bed on which you are lying.  Hold the  muscle as tight as you can without increasing your pain for __________ seconds.  Relax the muscles slowly and completely in between each repetition. STRENGTH - Quadriceps, Short Arcs   Lie on your back. Place a rolled under your knee so that the knee slightly bends.  Raise only your lower leg by tightening the muscles in the front of your thigh. Do not allow your thigh to rise. STRENGTH - Quadriceps, Straight Leg Raises  Quality counts! Watch for signs that the quadriceps muscle is working to insure you are strengthening the correct muscles and not "cheating" by substituting with healthier muscles.  Lay on your back with your right / left leg extended and your opposite knee bent.  Tense the muscles in the front of your right / left thigh. You should see either your knee cap slide up or increased dimpling just above the knee. Your thigh may even quiver.  Tighten these muscles even more and raise your leg 4 to 6 inches off the floor. Keeping these muscles tense, lower your leg.  Relax the muscles slowly and completely in between each repetition. STRENGTH - Hamstring, Curls  Lay on your stomach with your legs extended. (If you lay on a bed, your feet may hang over the edge.)  Tighten the muscles in the back of your thigh to bend your right / left knee up to 90 degrees. Keep your hips flat on the bed/floor.  Hold this position for __________ seconds.  Slowly lower your leg back to the starting position. STRENGTH - Quadriceps, Squats  Stand in a door frame so  that your feet and knees are in line with the frame.  Use your hands for balance, not support, on the frame.  Slowly lower your weight, bending at the hips and knees. Keep your lower legs upright so that they are parallel with the door frame. Squat only within the range that does not increase your knee pain. Never let your hips drop below your knees.  Slowly return upright, pushing with your legs, not pulling with your  hands. STRENGTH - Quadriceps, Wall Slides  Follow guidelines for form closely. Increased knee pain often results from poorly placed feet or knees.  Lean against a smooth wall or door and walk your feet out 18-24 inches. Place your feet hip-width apart.  Slowly slide down the wall or door until your knees bend __________ degrees.* Keep your knees over your heels, not your toes, and in line with your hips, not falling to either side.

## 2016-06-15 NOTE — Telephone Encounter (Signed)
WOULD YOU LIKE TO CHANGE TO INDOMETHACIN OR NAPROXEN WHICH ARE PREFERRED DRUGS

## 2016-06-15 NOTE — Progress Notes (Signed)
Patient ID: [name], female   DOB: September 25, 1955, 60 y.o.   MRN: [id number]  Chief Complaint  Patient presents with  . Follow-up    LEFT KNEE PAIN    HPI:60 year old female with bilateral knee pain left greater than right for several years. She complains of severe 10 out of 10 knee pain medial compartments and anterior patellofemoral compartments. She's not had any treatment at this time. She has trouble getting out of a chair she has trouble with her activities of daily living.  Her BMI is 36.   Review of Systems  Constitutional: Negative for fever and weight loss.  Respiratory: Positive for wheezing.   Cardiovascular: Negative for chest pain.  Musculoskeletal: Positive for back pain.   Past Medical History:  Diagnosis Date  . Anxiety   . Bipolar 1 disorder ([place])   . COPD (chronic obstructive pulmonary disease) ([place])   . Depression   . Emphysema of lung ([place])   . GERD (gastroesophageal reflux disease)   . Hyperlipidemia   . Hypertension   . Vaginal Pap smear, abnormal     PHYSICAL EXAM  BP (!) 137/91   Pulse 87   Ht '5\' 6"'$  (1.676 m)   Wt 225 lb (102.1 kg)   BMI 36.32 kg/m  GENERAL appearance reveals no gross abnormalities  MENTAL STATUS we note that the patient is awake alert and oriented to person place and time MOOD/AFFECT ARE NORMAL   GAIT reveals normal gait pattern Abnormal gait pattern with a slight limp   EXAM OF THE left knee KNEE SKIN no erythema lacerations or ecchymosis  INSPECTION tenderness over the medial compartment no effusion ROM full knee extension 110 knee flexion may be diminished because of the size of her leg STABILITY of the cruciate and collateral ligaments was normal MOTOR GRADE 5/5 quadriceps and hamstrings   examination of the  right knee   also exhibits medial joint line tenderness flexion 110 no effusion cruciate and collateral ligament stable grade 5 strength quadriceps and hamstrings  VASC 2+ dorsalis pedis pulse  normal capillary refill excellent warmth to the extremity  NEURO sensation and no pathologic reflexes  LYMPH deferred noncontributory   IMAGING STUDIES  I have independently reviewed the x-rays and I interpreted the x-rays as follows:  X-rays of the left knee show medial compartment severe narrowing consistent with osteoarthritis  Dx   primary osteoarthritis of the  both right and left knee  PLAN   Exercise, Weight loss, Anti-inflammatories Bilateral knee injections Return 1 year for follow-up x-rays   Procedure note left knee injection verbal consent was obtained to inject left knee joint  Timeout was completed to confirm the site of injection  The medications used were 40 mg of Depo-Medrol and 1% lidocaine 3 cc  Anesthesia was provided by ethyl chloride and the skin was prepped with alcohol.  After cleaning the skin with alcohol a 20-gauge needle was used to inject the left knee joint. There were no complications. A sterile bandage was applied.   Procedure note right knee injection verbal consent was obtained to inject right knee joint  Timeout was completed to confirm the site of injection  The medications used were 40 mg of Depo-Medrol and 1% lidocaine 3 cc  Anesthesia was provided by ethyl chloride and the skin was prepped with alcohol.  After cleaning the skin with alcohol a 20-gauge needle was used to inject the right knee joint. There were no complications. A sterile bandage was applied.  11:42 AM Arther Abbott, MD 06/15/2016

## 2016-06-15 NOTE — Telephone Encounter (Signed)
Patient called - states [facility] had told her to contact our office - prior authorization needed for medication prescribed today::  - diclofenac (CATAFLAM) 50 MG tablet (or if a different medication would need to be prescribed - please advise.

## 2016-06-16 ENCOUNTER — Other Ambulatory Visit: Payer: Self-pay | Admitting: *Deleted

## 2016-06-16 ENCOUNTER — Ambulatory Visit (HOSPITAL_COMMUNITY): Admission: RE | Admit: 2016-06-16 | Payer: Medicaid Other | Source: Ambulatory Visit

## 2016-06-16 MED ORDER — NAPROXEN 500 MG PO TABS: 500.0000 mg | ORAL_TABLET | Freq: Two times a day (BID) | ORAL | 2 refills | Status: DC

## 2016-06-16 NOTE — Telephone Encounter (Signed)
Medication changed, called patient, no answer

## 2016-06-17 ENCOUNTER — Telehealth: Payer: Self-pay | Admitting: Orthopedic Surgery

## 2016-06-17 NOTE — Telephone Encounter (Signed)
He will not, also he changed the other medication at the pharmacy to one preferred by her insurance

## 2016-06-17 NOTE — Telephone Encounter (Signed)
Patient is asking if Dr. Aline Brochure would do her a prescription for Oxycodone for pain.

## 2016-06-21 NOTE — Telephone Encounter (Signed)
RX refilled on 06/09/16.

## 2016-06-22 ENCOUNTER — Encounter: Payer: Self-pay | Admitting: Obstetrics & Gynecology

## 2016-06-22 ENCOUNTER — Ambulatory Visit (INDEPENDENT_AMBULATORY_CARE_PROVIDER_SITE_OTHER): Payer: Medicaid Other | Admitting: Obstetrics & Gynecology

## 2016-06-22 ENCOUNTER — Other Ambulatory Visit (HOSPITAL_COMMUNITY)
Admission: RE | Admit: 2016-06-22 | Discharge: 2016-06-22 | Disposition: A | Discharge status: Home / Self Care | Payer: Medicaid Other | Source: Ambulatory Visit | Attending: Obstetrics & Gynecology | Admitting: Obstetrics & Gynecology

## 2016-06-22 VITALS — BP 140/80 | HR 82 | Ht 64.0 in | Wt 221.4 lb

## 2016-06-22 DIAGNOSIS — Z01419 Encounter for gynecological examination (general) (routine) without abnormal findings: Secondary | ICD-10-CM

## 2016-06-22 DIAGNOSIS — Z0000 Encounter for general adult medical examination without abnormal findings: Secondary | ICD-10-CM

## 2016-06-22 DIAGNOSIS — F5231 Female orgasmic disorder: Secondary | ICD-10-CM

## 2016-06-22 DIAGNOSIS — R87612 Low grade squamous intraepithelial lesion on cytologic smear of cervix (LGSIL): Secondary | ICD-10-CM

## 2016-06-22 NOTE — Progress Notes (Signed)
Subjective:     [name] is a 60 y.o. female here for a routine exam.  No LMP recorded. Patient is postmenopausal. No obstetric history on file. Birth Control Method:  none Menstrual Calendar(currently): amenorrheic  Current complaints: having trouble achieving orgasm.   Current acute medical issues:  none   Recent Gynecologic History No LMP recorded. Patient is postmenopausal. Last Pap: 2016,  abnormal Last mammogram: 2017,  normal  Past Medical History:  Diagnosis Date  . Anxiety   . Bipolar 1 disorder ([place])   . COPD (chronic obstructive pulmonary disease) ([place])   . Depression   . Emphysema of lung ([place])   . GERD (gastroesophageal reflux disease)   . Hyperlipidemia   . Hypertension   . Vaginal Pap smear, abnormal     Past Surgical History:  Procedure Laterality Date  . CHOLECYSTECTOMY    . COLONOSCOPY  July 2010   Dr. Arnoldo Morale: 3 rectal polyps, not enough tissue for pathologic examination, recommended surveillance in 3 years  . COLONOSCOPY N/A 10/23/2014   RMR: Multiple colonic polyps removed as described above. No endoscopic explaniation for abdominal pain. however. next tcs 10/2019  . ESOPHAGOGASTRODUODENOSCOPY  July 2010   Dr. Arnoldo Morale: gastritis and duodenitis, H.pylori negative  . ESOPHAGOGASTRODUODENOSCOPY N/A 10/23/2014   RMR: Normal EGD. Status post passage of a Maloney dilator. Today's finding s would not explain abdominal pain  . HERNIA REPAIR     Dr. Arnoldo Morale  . KIDNEY STONE SURGERY    . MALONEY DILATION N/A 10/23/2014   Procedure: Venia Minks DILATION;  Surgeon: Daneil Dolin, MD;  [facility];  Service: Endoscopy;  Laterality: N/A;  . OPEN REDUCTION INTERNAL FIXATION (ORIF) DISTAL RADIAL FRACTURE Right 12/09/2015   Procedure: OPEN REDUCTION INTERNAL FIXATION (ORIF) RIGHT DISTAL RADIUS;  Surgeon: Leanora Cover, MD;  Location: [facility];  Service: Orthopedics;  Laterality: Right;    OB History    No data available       Social History   Social History  . Marital status: Married    Spouse name: N/A  . Number of children: N/A  . Years of education: N/A   Occupational History  . disability    Social History Main Topics  . Smoking status: Current Some Day Smoker    Packs/day: 0.50    Years: 42.00    Types: Cigarettes  . Smokeless tobacco: Never Used     Comment: 1/2 pack daily  . Alcohol use 0.6 oz/week    1 Standard drinks or equivalent per week     Comment: quit 11/03/2014. used to drink couple of 40 ounces.  . Drug use: No  . Sexual activity: Yes    Birth control/ protection: Post-menopausal   Other Topics Concern  . None   Social History Narrative  . None    Family History  Problem Relation Age of Onset  . Adopted: Yes     Current Outpatient Prescriptions:  .  albuterol (PROVENTIL) (2.5 MG/3ML) 0.083% nebulizer solution, Take 2.5 mg by nebulization every 6 (six) hours as needed for wheezing or shortness of breath., Disp: , Rfl:  .  albuterol-ipratropium (COMBIVENT) 18-103 MCG/ACT inhaler, Inhale into the lungs every 4 (four) hours., Disp: , Rfl:  .  estradiol (ESTRACE) 1 MG tablet, Take 1 tablet BID, Disp: 60 tablet, Rfl: 11 .  fluticasone (FLONASE) 50 MCG/ACT nasal spray, Place 1 spray into both nostrils 2 (two) times daily as needed for allergies or rhinitis., Disp: 16 g,  Rfl: 6 .  furosemide (LASIX) 40 MG tablet, Take 1 tablet (40 mg total) by mouth daily. (Patient taking differently: Take 20 mg by mouth daily. ), Disp: 30 tablet, Rfl: 3 .  ibuprofen (ADVIL,MOTRIN) 800 MG tablet, Take 800 mg by mouth every 6 (six) hours as needed for moderate pain., Disp: , Rfl:  .  lisinopril (PRINIVIL,ZESTRIL) 20 MG tablet, TAKE ONE TABLET BY MOUTH DAILY., Disp: 30 tablet, Rfl: 0 .  medroxyPROGESTERone (PROVERA) 2.5 MG tablet, TAKE ONE TABLET BY MOUTH DAILY., Disp: 30 tablet, Rfl: 11 .  naproxen (NAPROSYN) 500 MG tablet, Take 1 tablet (500 mg total) by mouth 2 (two) times daily with a meal.,  Disp: 60 tablet, Rfl: 2 .  promethazine (PHENERGAN) 25 MG tablet, TAKE (1) TABLET BY MOUTH EVERY SIX HOURS AS NEEDED., Disp: 30 tablet, Rfl: 0 .  sucralfate (CARAFATE) 1 GM/10ML suspension, Take 10 mLs (1 g total) by mouth 4 (four) times daily -  with meals and at bedtime., Disp: 420 mL, Rfl: 1 .  lidocaine (XYLOCAINE) 2 % solution, TAKE 15 MLS BY MOUTH EVERY 6 HOURS AS NEEDED., Disp: 200 mL, Rfl: 0 .  nitroGLYCERIN (NITROSTAT) 0.4 MG SL tablet, DISSOLVE 1 TABLET UNDER THE TONGUE EVERY 5 MINUTES IF NEEDED FOR CHEST PAIN. MAX 3 DOSES THEN CALL 911. (Patient not taking: Reported on 06/22/2016), Disp: 25 tablet, Rfl: 3  Review of Systems  Review of Systems  Constitutional: Negative for fever, chills, weight loss, malaise/fatigue and diaphoresis.  HENT: Negative for hearing loss, ear pain, nosebleeds, congestion, sore throat, neck pain, tinnitus and ear discharge.   Eyes: Negative for blurred vision, double vision, photophobia, pain, discharge and redness.  Respiratory: Negative for cough, hemoptysis, sputum production, shortness of breath, wheezing and stridor.   Cardiovascular: Negative for chest pain, palpitations, orthopnea, claudication, leg swelling and PND.  Gastrointestinal: negative for abdominal pain. Negative for heartburn, nausea, vomiting, diarrhea, constipation, blood in stool and melena.  Genitourinary: Negative for dysuria, urgency, frequency, hematuria and flank pain.  Musculoskeletal: Negative for myalgias, back pain, joint pain and falls.  Skin: Negative for itching and rash.  Neurological: Negative for dizziness, tingling, tremors, sensory change, speech change, focal weakness, seizures, loss of consciousness, weakness and headaches.  Endo/Heme/Allergies: Negative for environmental allergies and polydipsia. Does not bruise/bleed easily.  Psychiatric/Behavioral: Negative for depression, suicidal ideas, hallucinations, memory loss and substance abuse. The patient is not  nervous/anxious and does not have insomnia.        Objective:  Blood pressure 140/80, pulse 82, height '5\' 4"'$  (1.626 m), weight 221 lb 6.4 oz (100.4 kg).   Physical Exam  Vitals reviewed. Constitutional: She is oriented to person, place, and time. She appears well-developed and well-nourished.  HENT:  Head: Normocephalic and atraumatic.        Right Ear: External ear normal.  Left Ear: External ear normal.  Nose: Nose normal.  Mouth/Throat: Oropharynx is clear and moist.  Eyes: Conjunctivae and EOM are normal. Pupils are equal, round, and reactive to light. Right eye exhibits no discharge. Left eye exhibits no discharge. No scleral icterus.  Neck: Normal range of motion. Neck supple. No tracheal deviation present. No thyromegaly present.  Cardiovascular: Normal rate, regular rhythm, normal heart sounds and intact distal pulses.  Exam reveals no gallop and no friction rub.   No murmur heard. Respiratory: Effort normal and breath sounds normal. No respiratory distress. She has no wheezes. She has no rales. She exhibits no tenderness.  GI: Soft. Bowel sounds are normal.  She exhibits no distension and no mass. There is no tenderness. There is no rebound and no guarding.  Genitourinary:  Breasts no masses skin changes or nipple changes bilaterally      Vulva is normal without lesions Vagina is pink moist without discharge Cervix normal in appearance and pap is done Uterus is normal size shape and contour Adnexa is negative with normal sized ovaries   Musculoskeletal: Normal range of motion. She exhibits no edema and no tenderness.  Neurological: She is alert and oriented to person, place, and time. She has normal reflexes. She displays normal reflexes. No cranial nerve deficit. She exhibits normal muscle tone. Coordination normal.  Skin: Skin is warm and dry. No rash noted. No erythema. No pallor.  Psychiatric: She has a normal mood and affect. Her behavior is normal. Judgment and thought  content normal.       Medications Ordered at today's visit: Meds ordered this encounter  Medications  . albuterol-ipratropium (COMBIVENT) 18-103 MCG/ACT inhaler    Sig: Inhale into the lungs every 4 (four) hours.    Other orders placed at today's visit: No orders of the defined types were placed in this encounter.     Assessment:    Healthy female exam.    Plan:    atrophic changes Not a candidate for topical estrogen with PMH current medical issues Will try scream cream     No Follow-up on file.

## 2016-06-23 LAB — CYTOLOGY - PAP: Diagnosis: NEGATIVE

## 2016-06-25 ENCOUNTER — Encounter (HOSPITAL_COMMUNITY): Payer: Medicaid Other

## 2016-06-30 ENCOUNTER — Telehealth: Payer: Self-pay

## 2016-06-30 NOTE — Telephone Encounter (Signed)
Opened in error

## 2016-07-08 ENCOUNTER — Telehealth: Payer: Self-pay | Admitting: *Deleted

## 2016-07-08 NOTE — Telephone Encounter (Signed)
Results to negative pap smear reported to patient.

## 2016-07-14 ENCOUNTER — Telehealth: Payer: Self-pay | Admitting: *Deleted

## 2016-07-14 ENCOUNTER — Ambulatory Visit (HOSPITAL_COMMUNITY)
Admission: RE | Admit: 2016-07-14 | Discharge: 2016-07-14 | Disposition: A | Discharge status: Home / Self Care | Payer: Medicaid Other | Source: Ambulatory Visit | Attending: Internal Medicine | Admitting: Internal Medicine

## 2016-07-14 DIAGNOSIS — J441 Chronic obstructive pulmonary disease with (acute) exacerbation: Secondary | ICD-10-CM

## 2016-07-14 LAB — SPIROMETRY WITH GRAPH
FEF 25-75 PRE: 1.68 L/s
FEF2575-%PRED-PRE: 72 %
FEV1-%Pred-Pre: 51 %
FEV1-PRE: 1.29 L
FEV1FVC-%Pred-Pre: 111 %
FEV6-%Pred-Pre: 46 %
FEV6-Pre: 1.48 L
FEV6FVC-%PRED-PRE: 103 %
FVC-%PRED-PRE: 45 %
FVC-Pre: 1.48 L
Pre FEV1/FVC ratio: 87 %
Pre FEV6/FVC Ratio: 100 %

## 2016-07-14 NOTE — Telephone Encounter (Signed)
Spoke with patient and she states the "Scream Cream" prescribed to her has not helped and would like to know if there is anything else that she could be prescribed? Please advise.

## 2016-07-16 ENCOUNTER — Other Ambulatory Visit: Payer: Self-pay | Admitting: Nurse Practitioner

## 2016-07-16 DIAGNOSIS — K219 Gastro-esophageal reflux disease without esophagitis: Secondary | ICD-10-CM

## 2016-07-19 ENCOUNTER — Telehealth: Payer: Self-pay | Admitting: Obstetrics & Gynecology

## 2016-07-19 ENCOUNTER — Other Ambulatory Visit: Payer: Self-pay | Admitting: Nurse Practitioner

## 2016-07-19 DIAGNOSIS — K219 Gastro-esophageal reflux disease without esophagitis: Secondary | ICD-10-CM

## 2016-07-19 NOTE — Telephone Encounter (Signed)
Patient has called again stating the "scream cream" is not working and wants to try something else. Please call patient.

## 2016-07-20 ENCOUNTER — Telehealth: Payer: Self-pay | Admitting: *Deleted

## 2016-07-23 NOTE — Telephone Encounter (Signed)
Spoke with pt letting her know to purchase a "toy" to help with getting orgasm. Pt voiced understanding. [place]

## 2016-07-27 ENCOUNTER — Other Ambulatory Visit: Payer: Self-pay | Admitting: Family Medicine

## 2016-07-27 DIAGNOSIS — J019 Acute sinusitis, unspecified: Secondary | ICD-10-CM

## 2016-08-04 ENCOUNTER — Telehealth: Payer: Self-pay | Admitting: Obstetrics & Gynecology

## 2016-08-04 NOTE — Telephone Encounter (Signed)
Pt wants to know if she can start taking the Estradiol and Provera again? Please advise.

## 2016-08-05 NOTE — Telephone Encounter (Signed)
With her medical problems and smoking I do not recommend systemic estrogen, the risks are too great for thromboembolic events

## 2016-08-06 NOTE — Telephone Encounter (Signed)
Unable to leave message

## 2016-08-10 NOTE — Telephone Encounter (Signed)
Informed patient that Dr Elonda Husky advised she not take the systemic estrogen pills due to her health risk factors and risk of clots. Pt verbalized understanding.

## 2016-08-17 ENCOUNTER — Encounter: Payer: Self-pay | Admitting: Gastroenterology

## 2016-08-26 ENCOUNTER — Ambulatory Visit: Payer: Self-pay | Admitting: Obstetrics & Gynecology

## 2016-08-30 ENCOUNTER — Other Ambulatory Visit (HOSPITAL_COMMUNITY): Payer: Self-pay | Admitting: Pulmonary Disease

## 2016-08-30 ENCOUNTER — Ambulatory Visit (HOSPITAL_COMMUNITY)
Admission: RE | Admit: 2016-08-30 | Discharge: 2016-08-30 | Disposition: A | Discharge status: Home / Self Care | Payer: Medicaid Other | Source: Ambulatory Visit | Attending: Pulmonary Disease | Admitting: Pulmonary Disease

## 2016-08-30 DIAGNOSIS — R0602 Shortness of breath: Secondary | ICD-10-CM

## 2016-09-07 ENCOUNTER — Ambulatory Visit: Payer: Self-pay | Admitting: Obstetrics & Gynecology

## 2016-09-15 ENCOUNTER — Other Ambulatory Visit: Payer: Self-pay

## 2016-09-21 ENCOUNTER — Telehealth: Payer: Self-pay | Admitting: Gastroenterology

## 2016-09-21 ENCOUNTER — Ambulatory Visit: Payer: Medicaid Other | Admitting: Gastroenterology

## 2016-09-21 DIAGNOSIS — F0633 Mood disorder due to known physiological condition with manic features: Secondary | ICD-10-CM | POA: Insufficient documentation

## 2016-09-21 DIAGNOSIS — F3170 Bipolar disorder, currently in remission, most recent episode unspecified: Secondary | ICD-10-CM | POA: Insufficient documentation

## 2016-09-21 NOTE — Telephone Encounter (Signed)
Not this time

## 2016-09-30 ENCOUNTER — Ambulatory Visit: Payer: Self-pay | Admitting: Obstetrics & Gynecology

## 2016-10-04 ENCOUNTER — Telehealth: Payer: Self-pay | Admitting: *Deleted

## 2016-10-04 NOTE — Congregational Nurse Program (Signed)
Congregational Nurse Program Note  Date of Encounter: 10/01/2016  Past Medical History: Past Medical History:  Diagnosis Date  . Anxiety   . Bipolar 1 disorder ([place])   . COPD (chronic obstructive pulmonary disease) ([place])   . Depression   . Emphysema of lung ([place])   . GERD (gastroesophageal reflux disease)   . Hyperlipidemia   . Hypertension   . Vaginal Pap smear, abnormal     Encounter Details:     CNP Questionnaire - 10/01/16 0900      Patient Demographics   Is this a new or existing patient? New   Patient is considered a/an Not Applicable   Race Caucasian/White     Patient Assistance   Location of Patient Assistance [facility] [place]   Patient's financial/insurance status Medicaid;Low Income   Uninsured Patient ([facility]) No   Patient referred to apply for the following financial assistance Not Research scientist (physical sciences) No   Assistance securing medications Yes   Type of Assistance [facility] health offerings Other     Encounter Details   Primary purpose of visit Other   Was an Emergency Department visit averted? Yes   Does patient have a medical provider? Yes   Patient referred to Follow up with established PCP   Was a mental health screening completed? (GAINS tool) No   Does patient have dental issues? No   Does patient have vision issues? No   Since previous encounter, have you referred patient for abnormal blood pressure that resulted in a new diagnosis or medication change? No   Does your patient have an abnormal blood glucose today? No   Since previous encounter, have you referred patient for abnormal blood glucose that resulted in a new diagnosis or medication change? No   Was there a life-saving intervention made? No    New client to [facility]. Client contacted RN and was referred from a local church with assistance with co-payments for medications she is unable to pay for this month.  Client  states she is a patient at [facility] and also of Dr Luan Pulling for her pulmonary treatment. Client is currently unable to fill her blood pressure medications , inhalers and albuterol solutions, and antibiotics for her pulmonary. Client has medicaid , but cannot afford the 3$ co-payments. Client has her prescriptions at [facility].  RN called [place] to verify medications and cost. Medications are as follows per pharmacy that is eligible for refills: Gabapentin Albuterol solution Iidocane solution HCTZ Combivent inhaler Lisinopril Naproxen z-pak Lovastatin  RN sent referral form by fax to [place] for a one time assistance only with co-payments for above medications. RN counseled client that .is a one time help only for this month and encouraged her to discuss with her medical providers any alternatives for assistance with co-payments or drug programs available. Client states she will do so. Client states that most of her disability goes toward her rent. She states her husband is also disabled, but that their relationship is strained and his disability goes toward utilities.  RN also counseled client on smoking cessation and it affects on her blood pressure as well as respiratory problems. Client states she has also been counseled by her medical providers that she should quit smoking.

## 2016-10-05 ENCOUNTER — Other Ambulatory Visit (HOSPITAL_COMMUNITY): Payer: Self-pay | Admitting: Internal Medicine

## 2016-10-05 ENCOUNTER — Ambulatory Visit (HOSPITAL_COMMUNITY)
Admission: RE | Admit: 2016-10-05 | Discharge: 2016-10-05 | Disposition: A | Discharge status: Home / Self Care | Payer: Medicaid Other | Source: Ambulatory Visit | Attending: Internal Medicine | Admitting: Internal Medicine

## 2016-10-05 DIAGNOSIS — M542 Cervicalgia: Secondary | ICD-10-CM | POA: Diagnosis present

## 2016-10-05 DIAGNOSIS — M50322 Other cervical disc degeneration at C5-C6 level: Secondary | ICD-10-CM | POA: Insufficient documentation

## 2016-10-05 DIAGNOSIS — R52 Pain, unspecified: Secondary | ICD-10-CM

## 2016-10-08 ENCOUNTER — Other Ambulatory Visit: Payer: Self-pay | Admitting: Nurse Practitioner

## 2016-10-08 ENCOUNTER — Encounter: Payer: Self-pay | Admitting: Gastroenterology

## 2016-10-08 DIAGNOSIS — K219 Gastro-esophageal reflux disease without esophagitis: Secondary | ICD-10-CM

## 2016-10-08 NOTE — Telephone Encounter (Signed)
Patient states meds "shut her down" and had discussions with another provider regarding her libido, hormone levels. States someone suggested shots or pills. Also called with complaints of insomnia and depression. Informed patient she may need something for depression but states she is very hesitate to take meds d/t side effects. States she is planning on seeing someone at [facility]. Patient taking all over the place during the conversation, unable to understand at times.

## 2016-10-11 ENCOUNTER — Other Ambulatory Visit (HOSPITAL_COMMUNITY): Payer: Self-pay | Admitting: Internal Medicine

## 2016-10-11 DIAGNOSIS — Z1231 Encounter for screening mammogram for malignant neoplasm of breast: Secondary | ICD-10-CM

## 2016-10-14 ENCOUNTER — Other Ambulatory Visit: Payer: Self-pay | Admitting: Family Medicine

## 2016-10-14 ENCOUNTER — Ambulatory Visit: Payer: Self-pay | Admitting: Obstetrics & Gynecology

## 2016-10-14 DIAGNOSIS — J019 Acute sinusitis, unspecified: Secondary | ICD-10-CM

## 2016-10-21 ENCOUNTER — Ambulatory Visit (HOSPITAL_COMMUNITY): Payer: Medicaid Other

## 2016-10-21 ENCOUNTER — Encounter: Payer: Self-pay | Admitting: Obstetrics & Gynecology

## 2016-10-21 ENCOUNTER — Ambulatory Visit (INDEPENDENT_AMBULATORY_CARE_PROVIDER_SITE_OTHER): Payer: Medicaid Other | Admitting: Obstetrics & Gynecology

## 2016-10-21 VITALS — BP 126/78 | HR 90 | Wt 222.0 lb

## 2016-10-21 DIAGNOSIS — N952 Postmenopausal atrophic vaginitis: Secondary | ICD-10-CM | POA: Diagnosis not present

## 2016-10-21 MED ORDER — ESTRADIOL 0.1 MG/GM VA CREA: 1.0000 | TOPICAL_CREAM | Freq: Every day | VAGINAL | 12 refills | Status: DC

## 2016-10-21 NOTE — Progress Notes (Signed)
Chief Complaint  Patient presents with  . Follow-up    Blood pressure 126/78, pulse 90, weight 222 lb (100.7 kg).  61 y.o. No obstetric history on file. No LMP recorded. Patient is postmenopausal. The current method of family planning is post menopausal status.  Outpatient Encounter Prescriptions as of 10/21/2016  Medication Sig  . albuterol (PROVENTIL) (2.5 MG/3ML) 0.083% nebulizer solution Take 2.5 mg by nebulization every 6 (six) hours as needed for wheezing or shortness of breath.  Marland Kitchen albuterol-ipratropium (COMBIVENT) 18-103 MCG/ACT inhaler Inhale into the lungs every 4 (four) hours.  Marland Kitchen CARAFATE 1 GM/10ML suspension TAKE 10MLs BY MOUTH FOUR TIMES DAILY BEFORE MEALS AND AT BEDTIME.  Marland Kitchen estradiol (ESTRACE) 1 MG tablet Take 1 tablet BID  . fluticasone (FLONASE) 50 MCG/ACT nasal spray Place 1 spray into both nostrils 2 (two) times daily as needed for allergies or rhinitis.  . furosemide (LASIX) 40 MG tablet Take 1 tablet (40 mg total) by mouth daily.  Marland Kitchen lidocaine (XYLOCAINE) 2 % solution TAKE 15 MLS BY MOUTH EVERY 6 HOURS AS NEEDED.  Marland Kitchen lisinopril (PRINIVIL,ZESTRIL) 20 MG tablet TAKE ONE TABLET BY MOUTH DAILY.  . medroxyPROGESTERone (PROVERA) 2.5 MG tablet TAKE ONE TABLET BY MOUTH DAILY.  . naproxen (NAPROSYN) 500 MG tablet Take 1 tablet (500 mg total) by mouth 2 (two) times daily with a meal.  . estradiol (ESTRACE VAGINAL) 0.1 MG/GM vaginal cream Place 1 Applicatorful vaginally at bedtime.  Marland Kitchen ibuprofen (ADVIL,MOTRIN) 800 MG tablet Take 800 mg by mouth every 6 (six) hours as needed for moderate pain.  . nitroGLYCERIN (NITROSTAT) 0.4 MG SL tablet DISSOLVE 1 TABLET UNDER THE TONGUE EVERY 5 MINUTES IF NEEDED FOR CHEST PAIN. MAX 3 DOSES THEN CALL 911. (Patient not taking: Reported on 06/22/2016)  . promethazine (PHENERGAN) 25 MG tablet TAKE (1) TABLET BY MOUTH EVERY SIX HOURS AS NEEDED. (Patient not taking: Reported on 10/21/2016)   No facility-administered encounter medications on file as  of 10/21/2016.     Subjective Pt continues to suffer with vaginal atrophy and related symptoms It has adversely affected her sexual function and resulting desire and ability to achieve orgasm She is becoming increasingly depressed about this issue Hot flashes are better on oral estrogen  Objective   Pertinent ROS No burning with urination, frequency or urgency No nausea, vomiting or diarrhea Nor fever chills or other constitutional symptoms   Labs or studies     Impression Diagnoses this Encounter::   ICD-9-CM ICD-10-CM   1. Atrophic vaginitis 627.3 N95.2     Established relevant diagnosis(es):   Plan/Recommendations: Meds ordered this encounter  Medications  . estradiol (ESTRACE VAGINAL) 0.1 MG/GM vaginal cream    Sig: Place 1 Applicatorful vaginally at bedtime.    Dispense:  42.5 g    Refill:  12    Labs or Scans Ordered: No orders of the defined types were placed in this encounter.   Management:: Despite our best efforts at non hormonal management she continues to be insistent on using vaginal estrogen so she is begun on it today I have told her to go to a vape shop to get on a vaping system, her brother has stopped and smkoed 3 ppd  Follow up Return in about 3 months (around 01/20/2017) for Follow up, with Dr Elonda Husky.        Face to face time:  15 minutes  Greater than 50% of the visit time was spent in counseling and coordination of care with the patient.  The summary and outline of the counseling and care coordination is summarized in the note above.   All questions were answered.  Past Medical History:  Diagnosis Date  . Anxiety   . Bipolar 1 disorder ([place])   . COPD (chronic obstructive pulmonary disease) ([place])   . Depression   . Emphysema of lung ([place])   . GERD (gastroesophageal reflux disease)   . Hyperlipidemia   . Hypertension   . Vaginal Pap smear, abnormal     Past Surgical History:  Procedure Laterality Date  . CHOLECYSTECTOMY      . COLONOSCOPY  July 2010   Dr. Arnoldo Morale: 3 rectal polyps, not enough tissue for pathologic examination, recommended surveillance in 3 years  . COLONOSCOPY N/A 10/23/2014   RMR: Multiple colonic polyps removed as described above. No endoscopic explaniation for abdominal pain. however. next tcs 10/2019  . ESOPHAGOGASTRODUODENOSCOPY  July 2010   Dr. Arnoldo Morale: gastritis and duodenitis, H.pylori negative  . ESOPHAGOGASTRODUODENOSCOPY N/A 10/23/2014   RMR: Normal EGD. Status post passage of a Maloney dilator. Today's finding s would not explain abdominal pain  . HERNIA REPAIR     Dr. Arnoldo Morale  . KIDNEY STONE SURGERY    . MALONEY DILATION N/A 10/23/2014   Procedure: Venia Minks DILATION;  Surgeon: Daneil Dolin, MD;  [facility];  Service: Endoscopy;  Laterality: N/A;  . OPEN REDUCTION INTERNAL FIXATION (ORIF) DISTAL RADIAL FRACTURE Right 12/09/2015   Procedure: OPEN REDUCTION INTERNAL FIXATION (ORIF) RIGHT DISTAL RADIUS;  Surgeon: Leanora Cover, MD;  Location: [address];  Service: Orthopedics;  Laterality: Right;    OB History    No data available      Allergies  Allergen Reactions  . Penicillins Other (See Comments)    Patient is unsure if she allergic to penicillin or septra. Patient states one or another caused "rib pain with a little breathing problem". Has patient had a PCN reaction causing immediate rash, facial/tongue/throat swelling, SOB or lightheadedness with hypotension: YES Has patient had a PCN reaction causing severe rash involving mucus membranes or skin necrosis: NO Has patient had a PCN reaction that required hospitalization: NO Has patient had a PCN reaction occurring within the last 10 years: NO  . Septra [Sulfamethoxazole-Trimethoprim] Other (See Comments)    Patient is unsure if she allergic to septra or penicillin. Patient states one or another caused "rib pain with a little breathing problem".    Social History   Social History  . Marital status:  Married    Spouse name: N/A  . Number of children: N/A  . Years of education: N/A   Occupational History  . disability    Social History Main Topics  . Smoking status: Current Some Day Smoker    Packs/day: 0.50    Years: 42.00    Types: Cigarettes  . Smokeless tobacco: Never Used     Comment: 1/2 pack daily  . Alcohol use 0.6 oz/week    1 Standard drinks or equivalent per week     Comment: quit 11/03/2014. used to drink couple of 40 ounces.  . Drug use: No  . Sexual activity: Yes    Birth control/ protection: Post-menopausal   Other Topics Concern  . None   Social History Narrative  . None    Family History  Problem Relation Age of Onset  . Adopted: Yes

## 2016-10-25 ENCOUNTER — Telehealth: Payer: Self-pay | Admitting: *Deleted

## 2016-10-25 ENCOUNTER — Other Ambulatory Visit: Payer: Self-pay | Admitting: Obstetrics & Gynecology

## 2016-10-25 MED ORDER — ESTROGENS, CONJUGATED 0.625 MG/GM VA CREA: 1.0000 | TOPICAL_CREAM | Freq: Every day | VAGINAL | 12 refills | Status: DC

## 2016-10-25 NOTE — Telephone Encounter (Signed)
Medicaid covers: Estring Vaginal Ring Premarin Vaginal Cream Vagifem Vaginal Tablet  Please advise alternative if possible.

## 2016-10-25 NOTE — Telephone Encounter (Signed)
Changed to premarin vaginal cream

## 2016-10-28 ENCOUNTER — Ambulatory Visit (INDEPENDENT_AMBULATORY_CARE_PROVIDER_SITE_OTHER): Payer: Medicaid Other | Admitting: Gastroenterology

## 2016-10-28 ENCOUNTER — Encounter: Payer: Self-pay | Admitting: Gastroenterology

## 2016-10-28 VITALS — BP 102/68 | HR 74 | Ht 64.0 in | Wt 220.0 lb

## 2016-10-28 DIAGNOSIS — K30 Functional dyspepsia: Secondary | ICD-10-CM

## 2016-10-28 DIAGNOSIS — G8929 Other chronic pain: Secondary | ICD-10-CM | POA: Diagnosis not present

## 2016-10-28 DIAGNOSIS — R1013 Epigastric pain: Secondary | ICD-10-CM

## 2016-10-28 DIAGNOSIS — F419 Anxiety disorder, unspecified: Secondary | ICD-10-CM

## 2016-10-28 MED ORDER — DICYCLOMINE HCL 10 MG PO CAPS: 10.0000 mg | ORAL_CAPSULE | Freq: Four times a day (QID) | ORAL | 0 refills | Status: DC | PRN

## 2016-10-28 NOTE — Progress Notes (Signed)
[facility] Consult Note:  History: [name] 10/28/2016  Referring physician: Jani Gravel, MD  Reason for consult/chief complaint: Abdominal Pain (Epigastric region. Comes and goes for years. Getting worse. Takes liquid Lidocaine. It does not help.)   Subjective  HPI:  This is a 61 year old woman sent to me for another opinion on chronic abdominal pain. I reviewed records from the [facility] in 2015 and 2016. She has chronic dyspepsia characterized by a burning and crampy upper abdominal discomfort that has not improved with PPI, Carafate or lidocaine. She had a negative EGD and colonoscopy. A CT scan apparently was normal except for a small perirectal soft tissue abnormality that was thought possibly to be a lymph node. She went to [facility] for a rectal EUS and was told it was normal. She was discharged from the [facility] last November because she had 4 no shows in clinic. At one point she also had some ongoing problems with alcohol use and a alcohol level of 290. This is felt to be probably contribute into her symptoms. She tells me she has not had alcohol for years. She was previously using NSAIDs and BC powders but stopped doing that. Of note, she was a same-day cancellation for her clinic appointment here a month ago.  She has intermittent nausea, no vomiting, no dysphagia, appetite is good and weight stable. She is frustrated because she has had no change in the symptoms for years.  ROS:  Review of Systems  Constitutional: Negative for appetite change and unexpected weight change.  HENT: Negative for mouth sores and voice change.   Eyes: Negative for pain and redness.  Respiratory: Positive for shortness of breath. Negative for cough.   Cardiovascular: Negative for chest pain and palpitations.  Genitourinary: Negative for dysuria and hematuria.  Musculoskeletal: Positive for back pain. Negative for arthralgias  and myalgias.  Skin: Negative for pallor and rash.  Neurological: Negative for weakness and headaches.  Hematological: Negative for adenopathy.     Past Medical History: Past Medical History:  Diagnosis Date  . Anxiety   . Bipolar 1 disorder ([place])   . COPD (chronic obstructive pulmonary disease) ([place])   . Depression   . Emphysema of lung ([place])   . GERD (gastroesophageal reflux disease)   . Hyperlipidemia   . Hypertension   . Pneumonia 2015  . Vaginal Pap smear, abnormal      Past Surgical History: Past Surgical History:  Procedure Laterality Date  . CHOLECYSTECTOMY    . COLONOSCOPY  July 2010   Dr. Arnoldo Morale: 3 rectal polyps, not enough tissue for pathologic examination, recommended surveillance in 3 years  . COLONOSCOPY N/A 10/23/2014   RMR: Multiple colonic polyps removed as described above. No endoscopic explaniation for abdominal pain. however. next tcs 10/2019  . ESOPHAGOGASTRODUODENOSCOPY  July 2010   Dr. Arnoldo Morale: gastritis and duodenitis, H.pylori negative  . ESOPHAGOGASTRODUODENOSCOPY N/A 10/23/2014   RMR: Normal EGD. Status post passage of a Maloney dilator. Today's finding s would not explain abdominal pain  . HERNIA REPAIR     Dr. Arnoldo Morale  . KIDNEY STONE SURGERY    . MALONEY DILATION N/A 10/23/2014   Procedure: Venia Minks DILATION;  Surgeon: Daneil Dolin, MD;  [facility];  Service: Endoscopy;  Laterality: N/A;  . OPEN REDUCTION INTERNAL FIXATION (ORIF) DISTAL RADIAL FRACTURE Right 12/09/2015   Procedure: OPEN REDUCTION INTERNAL FIXATION (ORIF) RIGHT DISTAL RADIUS;  Surgeon: Leanora Cover, MD;  Location: [facility];  Service: Orthopedics;  Laterality: Right;     Family History: Family History  Problem Relation Age of Onset  . Adopted: Yes    Social History: Social History   Social History  . Marital status: Married    Spouse name: N/A  . Number of children: N/A  . Years of education: N/A   Occupational History  . disability     Social History Main Topics  . Smoking status: Current Some Day Smoker    Packs/day: 0.50    Years: 42.00    Types: Cigarettes  . Smokeless tobacco: Never Used     Comment: 1/2 pack daily  . Alcohol use 0.6 oz/week    1 Standard drinks or equivalent per week     Comment: quit 11/03/2014. used to drink couple of 40 ounces.  . Drug use: No  . Sexual activity: Yes    Birth control/ protection: Post-menopausal   Other Topics Concern  . None   Social History Narrative  . None    Allergies: Allergies  Allergen Reactions  . Penicillins Other (See Comments)    Patient is unsure if she allergic to penicillin or septra. Patient states one or another caused "rib pain with a little breathing problem". Has patient had a PCN reaction causing immediate rash, facial/tongue/throat swelling, SOB or lightheadedness with hypotension: YES Has patient had a PCN reaction causing severe rash involving mucus membranes or skin necrosis: NO Has patient had a PCN reaction that required hospitalization: NO Has patient had a PCN reaction occurring within the last 10 years: NO  . Septra [Sulfamethoxazole-Trimethoprim] Other (See Comments)    Patient is unsure if she allergic to septra or penicillin. Patient states one or another caused "rib pain with a little breathing problem".    Outpatient Meds: Current Outpatient Prescriptions  Medication Sig Dispense Refill  . albuterol (PROVENTIL) (2.5 MG/3ML) 0.083% nebulizer solution Take 2.5 mg by nebulization every 6 (six) hours as needed for wheezing or shortness of breath.    Marland Kitchen albuterol-ipratropium (COMBIVENT) 18-103 MCG/ACT inhaler Inhale into the lungs every 4 (four) hours.    . baclofen (LIORESAL) 10 MG tablet Take 10 mg by mouth 3 (three) times daily.    Marland Kitchen CARAFATE 1 GM/10ML suspension TAKE 10MLs BY MOUTH FOUR TIMES DAILY BEFORE MEALS AND AT BEDTIME. 420 mL 0  . conjugated estrogens (PREMARIN) vaginal cream Place 1 Applicatorful vaginally daily. 1 gram  daily 42.5 g 12  . estradiol (ESTRACE) 1 MG tablet Take 1 tablet BID 60 tablet 11  . fluticasone (FLONASE) 50 MCG/ACT nasal spray Place 1 spray into both nostrils 2 (two) times daily as needed for allergies or rhinitis. 16 g 6  . furosemide (LASIX) 40 MG tablet Take 1 tablet (40 mg total) by mouth daily. 30 tablet 3  . hydrochlorothiazide (HYDRODIURIL) 25 MG tablet Take 25 mg by mouth daily.    Marland Kitchen ibuprofen (ADVIL,MOTRIN) 800 MG tablet Take 800 mg by mouth every 6 (six) hours as needed for moderate pain.    Marland Kitchen lidocaine (XYLOCAINE) 2 % solution TAKE 15 MLS BY MOUTH EVERY 6 HOURS AS NEEDED. 200 mL 0  . lisinopril (PRINIVIL,ZESTRIL) 20 MG tablet TAKE ONE TABLET BY MOUTH DAILY. 30 tablet 0  . lovastatin (MEVACOR) 40 MG tablet Take 40 mg by mouth at bedtime.    . medroxyPROGESTERone (PROVERA) 2.5 MG tablet TAKE ONE TABLET BY MOUTH DAILY. 30 tablet 11  . naproxen (NAPROSYN) 500 MG tablet Take 1 tablet (500 mg total) by  mouth 2 (two) times daily with a meal. 60 tablet 2  . nitroGLYCERIN (NITROSTAT) 0.4 MG SL tablet DISSOLVE 1 TABLET UNDER THE TONGUE EVERY 5 MINUTES IF NEEDED FOR CHEST PAIN. MAX 3 DOSES THEN CALL 911. 25 tablet 3  . promethazine (PHENERGAN) 25 MG tablet TAKE (1) TABLET BY MOUTH EVERY SIX HOURS AS NEEDED. 30 tablet 0  . dicyclomine (BENTYL) 10 MG capsule Take 1 capsule (10 mg total) by mouth every 6 (six) hours as needed for spasms. 45 capsule 0   No current facility-administered medications for this visit.       ___________________________________________________________________ Objective   Exam:   BP 102/68   Pulse 74   Ht '5\' 4"'$  (1.626 m)   Wt 220 lb (99.8 kg)   BMI 37.76 kg/m    her husband accompanies her today General: this is a(n) Well-appearing woman, no muscle wasting   Eyes: sclera anicteric, no redness  ENT: oral mucosa moist without lesions, no cervical or supraclavicular lymphadenopathy, good dentition  CV: RRR without murmur, S1/S2, no JVD, no peripheral  edema  Resp: clear to auscultation bilaterally, normal RR and effort noted  GI: soft, mild epigastric tenderness, with active bowel sounds. No guarding or palpable organomegaly noted.  Skin; warm and dry, no rash or jaundice noted  Neuro: awake, alert and oriented x 3. Normal gross motor function and fluent speech  Extensive labs, imaging and endoscopy reports reviewed as noted above  Assessment: Encounter Diagnoses  Name Primary?  . Nonulcer dyspepsia Yes  . Anxiety   . Abdominal pain, chronic, epigastric     This patient has nonulcer dyspepsia. Her extensive workup has been complete, I do not think there is a diagnosis missed. I've no further diagnostic testing performed. Because the pain has a crampy quality at times, I have offered her a trial of dicyclomine 10 mg 3 times a day as needed for month. I asked her to stop the Carafate and lidocaine because they have not been helping. If she has little or no improvement from dicyclomine, then I will have nothing further to offer this patient and she will be returned to primary care.   Thank you for the courtesy of this consult.  Please call me with any questions or concerns.  Nelida Meuse III  CC: Jani Gravel, MD

## 2016-10-28 NOTE — Patient Instructions (Signed)
If you are age 62 or older, your body mass index should be between 23-30. Your Body mass index is 37.76 kg/m. If this is out of the aforementioned range listed, please consider follow up with your Primary Care Provider.  If you are age 54 or younger, your body mass index should be between 19-25. Your Body mass index is 37.76 kg/m. If this is out of the aformentioned range listed, please consider follow up with your Primary Care Provider.   Thank you for choosing [facility]  Dr Wilfrid Lund III

## 2016-12-12 ENCOUNTER — Emergency Department (HOSPITAL_COMMUNITY)
Admission: EM | Admit: 2016-12-12 | Discharge: 2016-12-13 | Disposition: A | Discharge status: Home / Self Care | Payer: Medicaid Other | Attending: Emergency Medicine | Admitting: Emergency Medicine

## 2016-12-12 ENCOUNTER — Emergency Department (HOSPITAL_COMMUNITY): Payer: Medicaid Other

## 2016-12-12 ENCOUNTER — Encounter (HOSPITAL_COMMUNITY): Payer: Self-pay | Admitting: Emergency Medicine

## 2016-12-12 DIAGNOSIS — F17210 Nicotine dependence, cigarettes, uncomplicated: Secondary | ICD-10-CM | POA: Insufficient documentation

## 2016-12-12 DIAGNOSIS — Y999 Unspecified external cause status: Secondary | ICD-10-CM | POA: Diagnosis not present

## 2016-12-12 DIAGNOSIS — S0993XA Unspecified injury of face, initial encounter: Secondary | ICD-10-CM | POA: Diagnosis present

## 2016-12-12 DIAGNOSIS — J449 Chronic obstructive pulmonary disease, unspecified: Secondary | ICD-10-CM | POA: Insufficient documentation

## 2016-12-12 DIAGNOSIS — Y9389 Activity, other specified: Secondary | ICD-10-CM | POA: Insufficient documentation

## 2016-12-12 DIAGNOSIS — Y929 Unspecified place or not applicable: Secondary | ICD-10-CM | POA: Insufficient documentation

## 2016-12-12 DIAGNOSIS — S01511A Laceration without foreign body of lip, initial encounter: Secondary | ICD-10-CM | POA: Diagnosis not present

## 2016-12-12 DIAGNOSIS — S0083XA Contusion of other part of head, initial encounter: Secondary | ICD-10-CM

## 2016-12-12 DIAGNOSIS — I10 Essential (primary) hypertension: Secondary | ICD-10-CM | POA: Diagnosis not present

## 2016-12-12 MED ORDER — DEXAMETHASONE SODIUM PHOSPHATE 4 MG/ML IJ SOLN
8.0000 mg | Freq: Once | INTRAMUSCULAR | Status: AC
  Administered 2016-12-12: 8 mg via INTRAMUSCULAR
  Filled 2016-12-12: qty 2

## 2016-12-12 MED ORDER — ALBUTEROL SULFATE HFA 108 (90 BASE) MCG/ACT IN AERS
2.0000 | INHALATION_SPRAY | Freq: Once | RESPIRATORY_TRACT | Status: AC
  Administered 2016-12-12: 2 via RESPIRATORY_TRACT
  Filled 2016-12-12: qty 6.7
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
## 2016-12-12 NOTE — ED Notes (Signed)
Called no answer in waiting room

## 2016-12-12 NOTE — Discharge Instructions (Addendum)
Your vital signs within normal limits. Your CT scan of your facial bones is negative for fracture or dislocation. The chest x-ray is negative for pneumonia or for rib fractures. Please use the albuterol every 4 hours for wheezing. Use Tylenol Extra Strength or ibuprofen for soreness. Use cool compress to your face on daily. Your oxygen level was low tonight. Please discuss this with Dr. Maudie Mercury as soon as possible. Return to the emergency department if any changes, problems, or concerns.

## 2016-12-12 NOTE — ED Notes (Signed)
Went in to discharge patient and pt unable to stay awake to go over discharge papers and sign; Vital signs updated and pt's O2 sat at 88%; Sima Matas PA notified of patient condition

## 2016-12-13 LAB — BLOOD GAS, ARTERIAL
ACID-BASE EXCESS: 0.9 mmol/L (ref 0.0–2.0)
BICARBONATE: 24.6 mmol/L (ref 20.0–28.0)
DRAWN BY: 22223
FIO2: 21
O2 Saturation: 94.3 %
PH ART: 7.354 (ref 7.350–7.450)
PO2 ART: 77.2 mmHg — AB (ref 83.0–108.0)
pCO2 arterial: 47.6 mmHg (ref 32.0–48.0)

## 2016-12-13 LAB — CBG MONITORING, ED: Glucose-Capillary: 126 mg/dL — ABNORMAL HIGH (ref 65–99)

## 2016-12-13 NOTE — ED Notes (Signed)
Pt speech continues to be slurred at times, pt will arouse with stimulation, update given,

## 2016-12-13 NOTE — ED Notes (Signed)
Pt alert, able to answer questions, denies any complaints, was able to ambulate to nursing desk with assistance, pulse ox remains 89-93 % on room air, [place] PA at bedside with pt,

## 2016-12-13 NOTE — ED Notes (Signed)
Pt ride here to pick pt up.

## 2016-12-13 NOTE — ED Notes (Signed)
Pt moved to room 7, pt will arouse with stimulation,, speech slurred, pulse ox 85% on RA, pt placed on oxygen at 2 lp;m via [place] with increase in pulse ox to 96%,

## 2016-12-29 ENCOUNTER — Other Ambulatory Visit (HOSPITAL_COMMUNITY): Payer: Self-pay | Admitting: Internal Medicine

## 2016-12-29 DIAGNOSIS — R109 Unspecified abdominal pain: Secondary | ICD-10-CM

## 2016-12-30 ENCOUNTER — Other Ambulatory Visit (HOSPITAL_COMMUNITY): Payer: Self-pay | Admitting: Family Medicine

## 2016-12-30 DIAGNOSIS — R109 Unspecified abdominal pain: Secondary | ICD-10-CM

## 2017-01-03 ENCOUNTER — Ambulatory Visit (INDEPENDENT_AMBULATORY_CARE_PROVIDER_SITE_OTHER): Payer: Medicaid Other | Admitting: Orthopedic Surgery

## 2017-01-03 ENCOUNTER — Encounter: Payer: Self-pay | Admitting: Orthopedic Surgery

## 2017-01-03 DIAGNOSIS — M7051 Other bursitis of knee, right knee: Secondary | ICD-10-CM | POA: Diagnosis not present

## 2017-01-03 DIAGNOSIS — M170 Bilateral primary osteoarthritis of knee: Secondary | ICD-10-CM | POA: Diagnosis not present

## 2017-01-03 DIAGNOSIS — M7052 Other bursitis of knee, left knee: Secondary | ICD-10-CM

## 2017-01-03 MED ORDER — DICLOFENAC POTASSIUM 50 MG PO TABS: 50.0000 mg | ORAL_TABLET | Freq: Two times a day (BID) | ORAL | 3 refills | Status: DC

## 2017-01-03 NOTE — Progress Notes (Signed)
Follow-up appointment established problem arthritis both knees  Chief Complaint  Patient presents with  . Follow-up    bilateral knee pain   She is 61 years old we treated her for arthritis of her knees with injections and naproxen. Naproxen which was covered by her insurance did not help her pain although the diclofenac did. That appears to not be covered.  She now complaining of medial knee pain bilaterally over the past bursa no history of trauma. The pain seems to be dull aching some swelling and present for a month or so     Review of Systems  Constitutional: Negative for chills, fever and weight loss.  Respiratory: Negative for shortness of breath.   Cardiovascular: Negative for chest pain.  Musculoskeletal: Positive for neck pain.  Neurological: Negative for tingling.    Past Medical History:  Diagnosis Date  . Anxiety   . Bipolar 1 disorder ([place])   . COPD (chronic obstructive pulmonary disease) ([place])   . Depression   . Emphysema of lung ([place])   . GERD (gastroesophageal reflux disease)   . Hyperlipidemia   . Hypertension   . Pneumonia 2015  . Vaginal Pap smear, abnormal      Current Outpatient Prescriptions:  .  albuterol (PROVENTIL) (2.5 MG/3ML) 0.083% nebulizer solution, Take 2.5 mg by nebulization every 6 (six) hours as needed for wheezing or shortness of breath., Disp: , Rfl:  .  albuterol-ipratropium (COMBIVENT) 18-103 MCG/ACT inhaler, Inhale into the lungs every 4 (four) hours., Disp: , Rfl:  .  baclofen (LIORESAL) 10 MG tablet, Take 10 mg by mouth 3 (three) times daily., Disp: , Rfl:  .  conjugated estrogens (PREMARIN) vaginal cream, Place 1 Applicatorful vaginally daily. 1 gram daily, Disp: 42.5 g, Rfl: 12 .  fluticasone (FLONASE) 50 MCG/ACT nasal spray, Place 1 spray into both nostrils 2 (two) times daily as needed for allergies or rhinitis., Disp: 16 g, Rfl: 6 .  furosemide (LASIX) 40 MG tablet, Take 1 tablet (40 mg total) by mouth daily., Disp: 30 tablet,  Rfl: 3 .  hydrochlorothiazide (HYDRODIURIL) 25 MG tablet, Take 25 mg by mouth daily., Disp: , Rfl:  .  ibuprofen (ADVIL,MOTRIN) 800 MG tablet, Take 800 mg by mouth every 6 (six) hours as needed for moderate pain., Disp: , Rfl:  .  lidocaine (XYLOCAINE) 2 % solution, TAKE 15 MLS BY MOUTH EVERY 6 HOURS AS NEEDED., Disp: 200 mL, Rfl: 0 .  lisinopril (PRINIVIL,ZESTRIL) 20 MG tablet, TAKE ONE TABLET BY MOUTH DAILY., Disp: 30 tablet, Rfl: 0 .  lovastatin (MEVACOR) 40 MG tablet, Take 40 mg by mouth at bedtime., Disp: , Rfl:  .  nitroGLYCERIN (NITROSTAT) 0.4 MG SL tablet, DISSOLVE 1 TABLET UNDER THE TONGUE EVERY 5 MINUTES IF NEEDED FOR CHEST PAIN. MAX 3 DOSES THEN CALL 911., Disp: 25 tablet, Rfl: 3 .  promethazine (PHENERGAN) 25 MG tablet, TAKE (1) TABLET BY MOUTH EVERY SIX HOURS AS NEEDED., Disp: 30 tablet, Rfl: 0 Past Surgical History:  Procedure Laterality Date  . CHOLECYSTECTOMY    . COLONOSCOPY  July 2010   Dr. Arnoldo Morale: 3 rectal polyps, not enough tissue for pathologic examination, recommended surveillance in 3 years  . COLONOSCOPY N/A 10/23/2014   RMR: Multiple colonic polyps removed as described above. No endoscopic explaniation for abdominal pain. however. next tcs 10/2019  . ESOPHAGOGASTRODUODENOSCOPY  July 2010   Dr. Arnoldo Morale: gastritis and duodenitis, H.pylori negative  . ESOPHAGOGASTRODUODENOSCOPY N/A 10/23/2014   RMR: Normal EGD. Status post passage of a Maloney dilator.  Today's finding s would not explain abdominal pain  . HERNIA REPAIR     Dr. Arnoldo Morale  . KIDNEY STONE SURGERY    . MALONEY DILATION N/A 10/23/2014   Procedure: Venia Minks DILATION;  Surgeon: Daneil Dolin, MD;  Location: [facility];  Service: Endoscopy;  Laterality: N/A;  . OPEN REDUCTION INTERNAL FIXATION (ORIF) DISTAL RADIAL FRACTURE Right 12/09/2015   Procedure: OPEN REDUCTION INTERNAL FIXATION (ORIF) RIGHT DISTAL RADIUS;  Surgeon: Leanora Cover, MD;  Location: [facility];  Service: Orthopedics;  Laterality:  Right;    Physical Exam  Constitutional: She is oriented to person, place, and time. She appears well-developed and well-nourished.  Neurological: She is alert and oriented to person, place, and time.  Psychiatric: She has a normal mood and affect.  Vitals reviewed.  Cardiovascular exam reveals normal color and warmth without edema both lower extremities  Further neurologic testing shows sensation is normal in both legs  Left Knee Exam   Tenderness  The patient is experiencing tenderness in the pes anserinus.  Range of Motion  Normal left knee ROM  Muscle Strength  Normal left knee strength  Tests  Lachman:  Anterior - Negative    Posterior - Negative  Right Knee Exam   Tenderness  The patient is experiencing tenderness in the pes anserinus.  Range of Motion  Normal right knee ROM  Muscle Strength  Normal right knee strength  Tests  Drawer:       Anterior - Negative    Posterior - Negative    Encounter Diagnoses  Name Primary?  . Primary osteoarthritis of both knees   . Pes anserinus bursitis of both knees Yes    Procedure note Left knee injection for bursitis  verbal consent was obtained to inject Left knee PES BURSA  Timeout was completed to confirm the site of injection  The medications used were 40 mg of Depo-Medrol and 1% lidocaine 3 cc  Anesthesia was provided by ethyl chloride and the skin was prepped with alcohol.  After cleaning the skin with alcohol a 25-gauge needle was used to inject the left knee bursa   There were no complications and a sterile bandage was applied   Procedure note right knee injection for bursitis  verbal consent was obtained to inject right knee PES BURSA  Timeout was completed to confirm the site of injection  The medications used were 40 mg of Depo-Medrol and 1% lidocaine 3 cc  Anesthesia was provided by ethyl chloride and the skin was prepped with alcohol.  After cleaning the skin with alcohol a 25-gauge needle  was used to inject the right knee bursa.  There were no complications and a sterile bandage was applied    She would like to schedule an appointment to see [place] for neck pain and headaches

## 2017-01-03 NOTE — Addendum Note (Signed)
Addended by: Carole Civil on: 01/03/2017 03:41 PM   Modules accepted: Orders

## 2017-01-03 NOTE — Patient Instructions (Signed)
Schedule appt for neck

## 2017-01-04 ENCOUNTER — Encounter (HOSPITAL_COMMUNITY): Payer: Self-pay

## 2017-01-04 ENCOUNTER — Ambulatory Visit (HOSPITAL_COMMUNITY)
Admission: RE | Admit: 2017-01-04 | Discharge: 2017-01-04 | Disposition: A | Discharge status: Home / Self Care | Payer: Medicaid Other | Source: Ambulatory Visit | Attending: Internal Medicine | Admitting: Internal Medicine

## 2017-01-10 ENCOUNTER — Ambulatory Visit (HOSPITAL_COMMUNITY): Payer: Medicaid Other

## 2017-01-17 ENCOUNTER — Ambulatory Visit (HOSPITAL_COMMUNITY): Admission: RE | Admit: 2017-01-17 | Payer: Medicaid Other | Source: Ambulatory Visit

## 2017-01-18 ENCOUNTER — Ambulatory Visit: Payer: Medicaid Other | Admitting: Orthopedic Surgery

## 2017-01-20 ENCOUNTER — Ambulatory Visit: Payer: Medicaid Other | Admitting: Obstetrics & Gynecology

## 2017-01-24 ENCOUNTER — Ambulatory Visit (HOSPITAL_COMMUNITY): Payer: Medicaid Other

## 2017-01-27 ENCOUNTER — Ambulatory Visit (HOSPITAL_COMMUNITY): Admission: RE | Admit: 2017-01-27 | Payer: Medicaid Other | Source: Ambulatory Visit

## 2017-02-04 ENCOUNTER — Ambulatory Visit: Payer: Medicaid Other | Admitting: Obstetrics & Gynecology

## 2017-02-08 ENCOUNTER — Ambulatory Visit (HOSPITAL_COMMUNITY): Admission: RE | Admit: 2017-02-08 | Payer: Medicaid Other | Source: Ambulatory Visit

## 2017-02-14 ENCOUNTER — Ambulatory Visit (HOSPITAL_COMMUNITY): Payer: Medicaid Other

## 2017-04-27 ENCOUNTER — Other Ambulatory Visit: Payer: Self-pay | Admitting: Orthopedic Surgery

## 2017-06-01 ENCOUNTER — Other Ambulatory Visit: Payer: Self-pay | Admitting: Gastroenterology

## 2017-06-14 ENCOUNTER — Ambulatory Visit: Payer: Medicaid Other | Admitting: Orthopedic Surgery

## 2017-06-21 ENCOUNTER — Ambulatory Visit (INDEPENDENT_AMBULATORY_CARE_PROVIDER_SITE_OTHER): Payer: Medicaid Other

## 2017-06-21 ENCOUNTER — Ambulatory Visit: Payer: Medicaid Other | Admitting: Orthopedic Surgery

## 2017-06-21 ENCOUNTER — Encounter: Payer: Self-pay | Admitting: Orthopedic Surgery

## 2017-06-21 VITALS — BP 132/85 | HR 106 | Ht 64.0 in | Wt 230.0 lb

## 2017-06-21 DIAGNOSIS — M1710 Unilateral primary osteoarthritis, unspecified knee: Secondary | ICD-10-CM | POA: Diagnosis not present

## 2017-06-21 DIAGNOSIS — M47812 Spondylosis without myelopathy or radiculopathy, cervical region: Secondary | ICD-10-CM | POA: Diagnosis not present

## 2017-06-21 DIAGNOSIS — M170 Bilateral primary osteoarthritis of knee: Secondary | ICD-10-CM | POA: Diagnosis not present

## 2017-06-21 MED ORDER — DICLOFENAC POTASSIUM 50 MG PO TABS: 50.0000 mg | ORAL_TABLET | Freq: Two times a day (BID) | ORAL | 3 refills | Status: DC

## 2017-06-21 NOTE — Patient Instructions (Signed)
Physical therapy has been ordered for you at [facility] [phone] is the phone number to call if you want to call to schedule. Please let us know if you do not hear anything within one week.   Degenerative Disk Disease Degenerative disk disease is a condition caused by the changes that occur in spinal disks as you grow older. Spinal disks are soft and compressible disks located between the bones of your spine (vertebrae). These disks act like shock absorbers. Degenerative disk disease can affect the whole spine. However, the neck and lower back are most commonly affected. Many changes can occur in the spinal disks with aging, such as:  The spinal disks may dry and shrink.  Small tears may occur in the tough, outer covering of the disk (annulus).  The disk space may become smaller due to loss of water.  Abnormal growths in the bone (spurs) may occur. This can put pressure on the nerve roots exiting the spinal canal, causing pain.  The spinal canal may become narrowed.  What increases the risk?  Being overweight.  Having a family history of degenerative disk disease.  Smoking.  There is increased risk if you are doing heavy lifting or have a sudden injury. What are the signs or symptoms? Symptoms vary from person to person and may include:  Pain that varies in intensity. Some people have no pain, while others have severe pain. The location of the pain depends on the part of your backbone that is affected. ? You will have neck or arm pain if a disk in the neck area is affected. ? You will have pain in your back, buttocks, or legs if a disk in the lower back is affected.  Pain that becomes worse while bending, reaching up, or with twisting movements.  Pain that may start gradually and then get worse as time passes. It may also start after a major or minor injury.  Numbness or tingling in the arms or legs.  How is this diagnosed? Your health care provider will ask you about your  symptoms and about activities or habits that may cause the pain. He or she may also ask about any injuries, diseases, or treatments you have had. Your health care provider will examine you to check for the range of movement that is possible in the affected area, to check for strength in your extremities, and to check for sensation in the areas of the arms and legs supplied by different nerve roots. You may also have:  An X-ray of the spine.  Other imaging tests, such as MRI.  How is this treated? Your health care provider will advise you on the best plan for treatment. Treatment may include:  Medicines.  Rehabilitation exercises.  Follow these instructions at home:  Follow proper lifting and walking techniques as advised by your health care provider.  Maintain good posture.  Exercise regularly as advised by your health care provider.  Perform relaxation exercises.  Change your sitting, standing, and sleeping habits as advised by your health care provider.  Change positions frequently.  Lose weight or maintain a healthy weight as advised by your health care provider.  Do not use any tobacco products, including cigarettes, chewing tobacco, or electronic cigarettes. If you need help quitting, ask your health care provider.  Wear supportive footwear.  Take medicines only as directed by your health care provider. Contact a health care provider if:  Your pain does not go away within 1-4 weeks.  You  have significant appetite or weight loss. Get help right away if:  Your pain is severe.  You notice weakness in your arms, hands, or legs.  You begin to lose control of your bladder or bowel movements.  You have fevers or night sweats. This information is not intended to replace advice given to you by your health care provider. Make sure you discuss any questions you have with your health care provider. Document Released: 04/18/2007 Document Revised: 11/27/2015 Document Reviewed:  10/23/2013 Elsevier Interactive Patient Education  [employer].

## 2017-06-21 NOTE — Progress Notes (Signed)
Progress Note   Patient ID: [name], female   DOB: 1955-12-14, 61 y.o.   MRN: [id number]  Chief Complaint  Patient presents with  . Knee Pain    bilateral     HPI  61 year old female presents for yearly evaluation of bilateral knee pain previous treatment included diclofenac which she is out of an injection.  She complains of bilateral knee pain medial compartment constant worse with activity associated with some swelling does not really complain of catching locking or giving way pain is moderate. Review of Systems  HENT:       Headache  Musculoskeletal: Positive for neck pain.  Skin: Negative.   Neurological: Positive for tingling.        occasionally both hands   No outpatient medications have been marked as taking for the 06/21/17 encounter (Office Visit) with Carole Civil, MD.    Past Medical History:  Diagnosis Date  . Anxiety   . Bipolar 1 disorder ([place])   . COPD (chronic obstructive pulmonary disease) ([place])   . Depression   . Emphysema of lung ([place])   . GERD (gastroesophageal reflux disease)   . Hyperlipidemia   . Hypertension   . Pneumonia 2015  . Vaginal Pap smear, abnormal      Allergies  Allergen Reactions  . Penicillins Other (See Comments)    Patient is unsure if she allergic to penicillin or septra. Patient states one or another caused "rib pain with a little breathing problem". Has patient had a PCN reaction causing immediate rash, facial/tongue/throat swelling, SOB or lightheadedness with hypotension: YES Has patient had a PCN reaction causing severe rash involving mucus membranes or skin necrosis: NO Has patient had a PCN reaction that required hospitalization: NO Has patient had a PCN reaction occurring within the last 10 years: NO  . Septra [Sulfamethoxazole-Trimethoprim] Other (See Comments)    Patient is unsure if she allergic to septra or penicillin. Patient states one or another caused "rib pain with a little breathing  problem".    BP 132/85   Pulse (!) 106   Ht 5\' 4"  (1.626 m)   Wt 230 lb (104.3 kg)   BMI 39.48 kg/m    Physical Exam  Constitutional: She is oriented to person, place, and time. She appears well-developed and well-nourished.  Musculoskeletal:       Back:       Legs: Neurological: She is alert and oriented to person, place, and time. Gait normal.  Skin: Skin is warm and dry. Capillary refill takes less than 2 seconds.  Psychiatric: She has a normal mood and affect. Her behavior is normal.       Medical decision-making  Imaging: Please see my dictated report bilateral knee films show bilateral medial compartment arthrosis without change in alignment  Encounter Diagnoses  Name Primary?  . Primary osteoarthritis of both knees Yes  . Cervical spondylosis without myelopathy        Procedure note left knee injection verbal consent was obtained to inject left knee joint  Timeout was completed to confirm the site of injection  The medications used were 40 mg of Depo-Medrol and 1% lidocaine 3 cc  Anesthesia was provided by ethyl chloride and the skin was prepped with alcohol.  After cleaning the skin with alcohol a 20-gauge needle was used to inject the left knee joint. There were no complications. A sterile bandage was applied.   Procedure note right knee injection verbal consent was obtained to inject right knee joint  Timeout was completed to confirm the site of injection  The medications used were 40 mg of Depo-Medrol and 1% lidocaine 3 cc  Anesthesia was provided by ethyl chloride and the skin was prepped with alcohol.  After cleaning the skin with alcohol a 20-gauge needle was used to inject the right knee joint. There were no complications. A sterile bandage was applied.  Recommend physical therapy.  Addendum x-ray showed degenerative disc changes in the mid cervical spine.  Please see dictated hospital report regarding that x-ray  Arther Abbott,  MD 06/21/2017 2:53 PM

## 2017-07-12 ENCOUNTER — Encounter (HOSPITAL_COMMUNITY): Payer: Self-pay

## 2017-07-12 ENCOUNTER — Ambulatory Visit (HOSPITAL_COMMUNITY): Payer: Medicaid Other | Attending: Orthopedic Surgery

## 2017-07-12 ENCOUNTER — Other Ambulatory Visit: Payer: Self-pay

## 2017-07-12 DIAGNOSIS — R51 Headache: Secondary | ICD-10-CM | POA: Insufficient documentation

## 2017-07-12 DIAGNOSIS — M542 Cervicalgia: Secondary | ICD-10-CM | POA: Diagnosis present

## 2017-07-12 DIAGNOSIS — G8929 Other chronic pain: Secondary | ICD-10-CM

## 2017-07-12 DIAGNOSIS — R293 Abnormal posture: Secondary | ICD-10-CM

## 2017-07-12 NOTE — Therapy (Signed)
[place], [place], [address] Phone: [phone]   Fax:  [phone]  Physical Therapy Evaluation  Patient Details  Name: [name] MRN: [id number] Date of Birth: August 04, 1955 Referring Provider: Aline Brochure   Encounter Date: 07/12/2017  PT End of Session - 07/12/17 1634    Visit Number  1    Number of Visits  9    Date for PT Re-Evaluation  08/09/17    Authorization Type  Medicaid [place]    Authorization Time Period  07/09/17-08/12/17    Authorization - Number of Visits  3    PT Start Time  6644    PT Stop Time  1430    PT Time Calculation (min)  45 min    Activity Tolerance  Patient tolerated treatment well    Behavior During Therapy  [facility] for tasks assessed/performed       Past Medical History:  Diagnosis Date  . Anxiety   . Bipolar 1 disorder ([place])   . COPD (chronic obstructive pulmonary disease) ([place])   . Depression   . Emphysema of lung ([place])   . GERD (gastroesophageal reflux disease)   . Hyperlipidemia   . Hypertension   . Pneumonia 2015  . Vaginal Pap smear, abnormal     Past Surgical History:  Procedure Laterality Date  . CHOLECYSTECTOMY    . COLONOSCOPY  July 2010   Dr. Arnoldo Morale: 3 rectal polyps, not enough tissue for pathologic examination, recommended surveillance in 3 years  . COLONOSCOPY N/A 10/23/2014   RMR: Multiple colonic polyps removed as described above. No endoscopic explaniation for abdominal pain. however. next tcs 10/2019  . ESOPHAGOGASTRODUODENOSCOPY  July 2010   Dr. Arnoldo Morale: gastritis and duodenitis, H.pylori negative  . ESOPHAGOGASTRODUODENOSCOPY N/A 10/23/2014   RMR: Normal EGD. Status post passage of a Maloney dilator. Today's finding s would not explain abdominal pain  . HERNIA REPAIR     Dr. Arnoldo Morale  . KIDNEY STONE SURGERY    . MALONEY DILATION N/A 10/23/2014   Procedure: Venia Minks DILATION;  Surgeon: Daneil Dolin, MD;  [facility];  Service:  Endoscopy;  Laterality: N/A;  . OPEN REDUCTION INTERNAL FIXATION (ORIF) DISTAL RADIAL FRACTURE Right 12/09/2015   Procedure: OPEN REDUCTION INTERNAL FIXATION (ORIF) RIGHT DISTAL RADIUS;  Surgeon: Leanora Cover, MD;  Location: [place];  Service: Orthopedics;  Laterality: Right;    There were no vitals filed for this visit.   Subjective Assessment - 07/12/17 1356    Subjective  Patient reports history of neck pain for a couple of years and denies any injury that may have contributed to it. She reports Dr. Aline Brochure told her she has degenerative disc disease and that this is causing her pain. She reports she has a history of LBP and bilateral knee pain because of arthritis and that she gets cortisone shots for this. She states she just had injections in both knees and was told to come back in a year for more. She does not have a follow-up appointment for her neck. She states she also has headaches almost every day and that the have gotten worse in the last couple years. She states they begin in the back of her head and wrap up and around her forehead. She states that looking down and reading for too long is uncomfortable.    Patient is accompained by:  Family member husband    Pertinent History  right radial fracture with ORIF in 2017  Limitations  Reading;House hold activities    How long can you sit comfortably?  unsure    How long can you stand comfortably?  unsure    How long can you walk comfortably?  unsure    Patient Stated Goals  decrease pain and headache frequency    Currently in Pain?  Yes    Pain Score  5     Pain Location  Neck    Pain Orientation  Right;Left;Upper;Mid    Pain Descriptors / Indicators  Aching;Dull    Pain Type  Chronic pain    Pain Radiating Towards  radiates into headache,     Pain Onset  More than a month ago    Pain Frequency  Constant    Aggravating Factors   looking down    Pain Relieving Factors  movement helps, medications    Effect of Pain  on Daily Activities  minimal         [facility] PT Assessment - 07/12/17 0001      Assessment   Referring Provider  Aline Brochure    Hand Dominance  Right    Next MD Visit  no follow-up scheduled    Prior Therapy  none      Precautions   Precautions  None      Restrictions   Weight Bearing Restrictions  No      Balance Screen   Has the patient fallen in the past 6 months  No    Has the patient had a decrease in activity level because of a fear of falling?   No    Is the patient reluctant to leave their home because of a fear of falling?   No      Home Environment   Living Environment  Private residence    Living Arrangements  Alone    Available Help at Discharge  Family    Type of [place] to enter    Entrance Stairs-Number of Steps  2    Entrance Stairs-Rails  Can reach both    [place]  One level      Prior Function   Level of Independence  Independent;Independent with basic ADLs    Vocation  On disability    Leisure  cleaning house, yardwork, walking      Cognition   Overall Cognitive Status  Within Functional Limits for tasks assessed      Observation/Other Assessments   Other Surveys   Other Surveys    Neck Disability Index   17/50      AROM   Cervical Flexion  60    Cervical Extension  60    Cervical - Right Side Bend  45    Cervical - Left Side Bend  45    Cervical - Right Rotation  65    Cervical - Left Rotation  65      Special Tests    Special Tests  Cervical       Objective measurements completed on examination: See above findings.    PT Education - 07/12/17 1633    Education provided  Yes    Education Details  Educated on exam findings and discussed anticipated timeline for improvements. Edcuated on benefits of PT and treatments we will utilize.    Person(s) Educated  Patient;Spouse    Methods  Explanation    Comprehension  Verbalized understanding       PT Short Term Goals - 07/12/17 1756  PT SHORT TERM  GOAL #1   Title  Patient will be independent with HEP and will be able to demosntrate proper exercises form to increase independence with functional strengthening and activity.    Time  2    Period  Weeks    Status  New    Target Date  07/26/17        PT Long Term Goals - 07/12/17 1802      PT LONG TERM GOAL #1   Title  Patient will report 50% improved reduction of frequency in headaches and report improved activity tolerance to daily activites with pain of less than or equal to a max of 2/10.    Time  4    Period  Weeks    Status  New    Target Date  08/09/17      PT LONG TERM GOAL #2   Title  Patient will improve her NDI score by 10 points to demonstrate a clincally meaningful change in decreasing disability realted to her neck pain.    Time  4    Period  Weeks    Status  New      PT LONG TERM GOAL #3   Title  Patient will have no tenderness to palpation with suboccipitals and no radiation of headache with palpation to suboccipitals to demonstrate idecreased myofascial pain to improve QOL.    Time  4    Period  Weeks    Status  New        Plan - 07/12/17 1638    Clinical Impression Statement  Ms. Poterfield presents for initial physical therapy examination secondary to chronic cervical spine pain. She denies any symptoms radiating into her upper extremities and has functional strength overall. She is currently limited with daily activities due to neck pain and frequent severe headaches. Her current impairments include: myofascial restriction in her paraspinal, bilateral upper trapezius, SCM, and bilateral suboccipitals, significant forward head posture, and hypomobility of hte cervical/thoracic spine. Palpation of the suboccipital muscles provoke her pain and headache symptoms. SHe will benefit from skilled PT interventions to address her current impairments and reduce pain to improve QOL and ability to participate in daily activities.    Clinical Presentation  Stable     Clinical Decision Making  Low    Rehab Potential  Good    PT Frequency  2x / week    PT Duration  4 weeks    PT Treatment/Interventions  ADLs/Self Care Home Management;Moist Heat;Cryotherapy;Functional mobility training;Therapeutic activities;Neuromuscular re-education;Patient/family education;Manual techniques;Passive range of motion;Dry needling;Taping;Electrical Stimulation    PT Next Visit Plan  Check Medicaid status. Review Eval and goals. Begin STM for paraspinals of C/T spine and upper trapezius. Initiate postural strengtheniing and UBE. Initiate PA to thoracic spine and T spine openers.    PT Home Exercise Plan  next session: upper trap stretch, SCM stretch, deep cervical retraction (chin tuck).    Consulted and Agree with Plan of Care  Patient       Patient will benefit from skilled therapeutic intervention in order to improve the following deficits and impairments:  Increased fascial restricitons, Improper body mechanics, Pain, Postural dysfunction, Decreased activity tolerance, Decreased endurance, Hypomobility, Impaired flexibility  Visit Diagnosis: Cervicalgia - Plan: PT plan of care cert/re-cert  Chronic intractable headache, unspecified headache type - Plan: PT plan of care cert/re-cert  Abnormal posture - Plan: PT plan of care cert/re-cert     Problem List Patient Active Problem List   Diagnosis Date Noted  .  Alcohol abuse 08/01/2015  . Anorgasmia of female 01/01/2015  . Low grade squamous intraepithelial lesion (LGSIL) on Papanicolaou smear of cervix 12/04/2014  . Abdominal pain, chronic, epigastric 11/20/2014  . Dysphagia, pharyngoesophageal phase   . Hx of colonic polyps   . COPD exacerbation ([place]) 07/05/2014  . GERD without esophagitis 07/05/2014  . Hypertension 07/05/2014  . Bipolar 1 disorder ([place]) 07/05/2014  . Anxiety 07/05/2014  . Polysubstance abuse ([place]) 07/05/2014  . Obesity (BMI 30-39.9) 07/05/2014  . Encephalopathy, metabolic 75/11/1831  .  Personal history of colonic polyps 04/01/2014  . Mild dysplasia of cervix 11/13/2013  . Benzodiazepine dependence ([place]) 08/26/2012  . COPD (chronic obstructive pulmonary disease) ([place]) 08/25/2012  . Panic disorder 08/25/2012  . Tobacco use disorder 08/25/2012  . Hyperlipidemia 08/25/2012    Kipp Brood, PT, DPT Physical Therapist with [facility]  07/12/2017 6:20 PM    [place], [place], [address] Phone: [phone]   Fax:  [phone]  Name: [name] MRN: [id number] Date of Birth: June 30, 1956

## 2017-07-15 ENCOUNTER — Ambulatory Visit (HOSPITAL_COMMUNITY): Payer: Medicaid Other

## 2017-07-15 ENCOUNTER — Telehealth (HOSPITAL_COMMUNITY): Payer: Self-pay | Admitting: Internal Medicine

## 2017-07-15 NOTE — Telephone Encounter (Signed)
07/15/17  pt cx said that she wasn't feeling to good

## 2017-07-19 ENCOUNTER — Ambulatory Visit (HOSPITAL_COMMUNITY): Payer: Medicaid Other

## 2017-07-19 ENCOUNTER — Telehealth (HOSPITAL_COMMUNITY): Payer: Self-pay | Admitting: Internal Medicine

## 2017-07-19 NOTE — Telephone Encounter (Signed)
07/19/17  Pt cx said she still doesn't have power

## 2017-07-21 ENCOUNTER — Encounter (HOSPITAL_COMMUNITY): Payer: Self-pay

## 2017-07-21 ENCOUNTER — Ambulatory Visit (HOSPITAL_COMMUNITY): Payer: Medicaid Other

## 2017-07-21 DIAGNOSIS — M542 Cervicalgia: Secondary | ICD-10-CM

## 2017-07-21 DIAGNOSIS — R51 Headache: Secondary | ICD-10-CM

## 2017-07-21 DIAGNOSIS — G8929 Other chronic pain: Secondary | ICD-10-CM

## 2017-07-21 DIAGNOSIS — R293 Abnormal posture: Secondary | ICD-10-CM

## 2017-07-21 NOTE — Therapy (Signed)
[place], [place], [address] Phone: [phone]   Fax:  [phone]  Physical Therapy Treatment  Patient Details  Name: [name] MRN: [id number] Date of Birth: [birth year] Referring Provider: Aline Brochure   Encounter Date: 07/21/2017  PT End of Session - 07/21/17 1309    Visit Number  2    Number of Visits  7    Date for PT Re-Evaluation  08/09/17    Authorization Type  Medicaid [place]/Medicaid approved 6 sessions 1/17-2/12/2017    Authorization Time Period  1/17-2/12/2017 for 6 sessions    Authorization - Visit Number  1    Authorization - Number of Visits  6    PT Start Time  5885    PT Stop Time  0277    PT Time Calculation (min)  45 min    Activity Tolerance  Patient tolerated treatment well;No increased pain    Behavior During Therapy  WFL for tasks assessed/performed       Past Medical History:  Diagnosis Date  . Anxiety   . Bipolar 1 disorder ([place])   . COPD (chronic obstructive pulmonary disease) ([place])   . Depression   . Emphysema of lung ([place])   . GERD (gastroesophageal reflux disease)   . Hyperlipidemia   . Hypertension   . Pneumonia 2015  . Vaginal Pap smear, abnormal     Past Surgical History:  Procedure Laterality Date  . CHOLECYSTECTOMY    . COLONOSCOPY  July 2010   Dr. Arnoldo Morale: 3 rectal polyps, not enough tissue for pathologic examination, recommended surveillance in 3 years  . COLONOSCOPY N/A 10/23/2014   RMR: Multiple colonic polyps removed as described above. No endoscopic explaniation for abdominal pain. however. next tcs 10/2019  . ESOPHAGOGASTRODUODENOSCOPY  July 2010   Dr. Arnoldo Morale: gastritis and duodenitis, H.pylori negative  . ESOPHAGOGASTRODUODENOSCOPY N/A 10/23/2014   RMR: Normal EGD. Status post passage of a Maloney dilator. Today's finding s would not explain abdominal pain  . HERNIA REPAIR     Dr. Arnoldo Morale  . KIDNEY STONE SURGERY    . MALONEY DILATION N/A 10/23/2014    Procedure: Venia Minks DILATION;  Surgeon: Daneil Dolin, MD;  [facility];  Service: Endoscopy;  Laterality: N/A;  . OPEN REDUCTION INTERNAL FIXATION (ORIF) DISTAL RADIAL FRACTURE Right 12/09/2015   Procedure: OPEN REDUCTION INTERNAL FIXATION (ORIF) RIGHT DISTAL RADIUS;  Surgeon: Leanora Cover, MD;  Location: [place];  Service: Orthopedics;  Laterality: Right;    There were no vitals filed for this visit.  Subjective Assessment - 07/21/17 1307    Subjective  Pt stated she is trying to be more aware of posture following last session.  Pt stated she continues to have neck pain 7/10 both sides.      Pertinent History  right radial fracture with ORIF in 2017    Patient Stated Goals  decrease pain and headache frequency    Currently in Pain?  Yes    Pain Score  7     Pain Location  Neck    Pain Orientation  Right;Left;Upper;Posterior    Pain Type  Chronic pain    Pain Onset  More than a month ago    Pain Frequency  Constant    Aggravating Factors   looking down    Pain Relieving Factors  movement helps medication    Effect of Pain on Daily Activities  minimal  [place] Adult PT Treatment/Exercise - 07/21/17 0001      Exercises   Exercises  Neck      Neck Exercises: Seated   Neck Retraction  10 reps;3 secs    Postural Training  educated importance of posture     Other Seated Exercise  scapular retraction 10x       Manual Therapy   Manual Therapy  Soft tissue mobilization    Manual therapy comments  Manual complete separate than rest of tx    Soft tissue mobilization  Upper traps, scalenes, levator scapular and SCM; suboccipital release 3x 30"; supine position with LE elevated      Neck Exercises: Stretches   Upper Trapezius Stretch  3 reps;30 seconds    Other Neck Stretches  SCM Bil 2x 30"             PT Education - 07/21/17 1355    Education provided  Yes    Education Details  Reviewed goals, established HEP and  copy of eval given to pt.  Pt educated on importance of posture for neck and back pain.    Person(s) Educated  Patient    Methods  Explanation;Demonstration;Handout;Verbal cues    Comprehension  Verbalized understanding;Returned demonstration;Need further instruction       PT Short Term Goals - 07/12/17 1756      PT SHORT TERM GOAL #1   Title  Patient will be independent with HEP and will be able to demosntrate proper exercises form to increase independence with functional strengthening and activity.    Time  2    Period  Weeks    Status  New    Target Date  07/26/17        PT Long Term Goals - 07/12/17 1802      PT LONG TERM GOAL #1   Title  Patient will report 50% improved reduction of frequency in headaches and report improved activity tolerance to daily activites with pain of less than or equal to a max of 2/10.    Time  4    Period  Weeks    Status  New    Target Date  08/09/17      PT LONG TERM GOAL #2   Title  Patient will improve her NDI score by 10 points to demonstrate a clincally meaningful change in decreasing disability realted to her neck pain.    Time  4    Period  Weeks    Status  New      PT LONG TERM GOAL #3   Title  Patient will have no tenderness to palpation with suboccipitals and no radiation of headache with palpation to suboccipitals to demonstrate idecreased myofascial pain to improve QOL.    Time  4    Period  Weeks    Status  New            Plan - 07/21/17 1356    Clinical Impression Statement  Reviewed goals and copy of eval given to pt.  Pt educated on importance of posture to reduce strain on cervical musculature, verbalized understanding.  Session focus on cervical mobility and posture strenghtening.  Added UT/SCM stretches and began cervical and scapular retraction for posture strengthening.  Established HEP to address the posture and mobiltiy.  EOS with manual soft tissue mobilization technqiues to address tightness in upper traps,  cervical paraspinals and suboccipital release complete to assist with headaches.  Pt educated on importance of hydration to reduce risk of headache following  manual technqiues.      Rehab Potential  Good    PT Frequency  2x / week    PT Duration  4 weeks    PT Treatment/Interventions  ADLs/Self Care Home Management;Moist Heat;Cryotherapy;Functional mobility training;Therapeutic activities;Neuromuscular re-education;Patient/family education;Manual techniques;Passive range of motion;Dry needling;Taping;Electrical Stimulation    PT Next Visit Plan  Next session progress postural strengthening with UBE backwards, Wback and theraband, add HEP PRN.  Continue manual STM for paraspinals of C/T spine and upper traps with suboccipital release techniques.  Initiate PA to thoracic spine and T spine openers.      PT Home Exercise Plan  upper trap stretch, SCM stretch, deep cervical retraction (chin tuck).       Patient will benefit from skilled therapeutic intervention in order to improve the following deficits and impairments:  Increased fascial restricitons, Improper body mechanics, Pain, Postural dysfunction, Decreased activity tolerance, Decreased endurance, Hypomobility, Impaired flexibility  Visit Diagnosis: Cervicalgia  Chronic intractable headache, unspecified headache type  Abnormal posture     Problem List Patient Active Problem List   Diagnosis Date Noted  . Alcohol abuse 08/01/2015  . Anorgasmia of female 01/01/2015  . Low grade squamous intraepithelial lesion (LGSIL) on Papanicolaou smear of cervix 12/04/2014  . Abdominal pain, chronic, epigastric 11/20/2014  . Dysphagia, pharyngoesophageal phase   . Hx of colonic polyps   . COPD exacerbation ([place]) 07/05/2014  . GERD without esophagitis 07/05/2014  . Hypertension 07/05/2014  . Bipolar 1 disorder ([place]) 07/05/2014  . Anxiety 07/05/2014  . Polysubstance abuse ([place]) 07/05/2014  . Obesity (BMI 30-39.9) 07/05/2014  . Encephalopathy,  metabolic 59/74/7185  . Personal history of colonic polyps 04/01/2014  . Mild dysplasia of cervix 11/13/2013  . Benzodiazepine dependence ([place]) 08/26/2012  . COPD (chronic obstructive pulmonary disease) ([place]) 08/25/2012  . Panic disorder 08/25/2012  . Tobacco use disorder 08/25/2012  . Hyperlipidemia 08/25/2012   Ihor Austin, [place]; [place]  Aldona Lento 07/21/2017, 2:06 PM  [place] [address] [place], [place], [address] Phone: [phone]   Fax:  [phone]  Name: [name] MRN: [id number] Date of Birth: [birth year]

## 2017-07-21 NOTE — Patient Instructions (Signed)
Flexibility: Upper Trapezius Stretch    Gently grasp right side of head while reaching behind back with other hand. Tilt head away until a gentle stretch is felt. Hold 30 seconds. Repeat 3 times per set. Do 1-2 sets per session.   http://orth.exer.us/341   Copyright  VHI. All rights reserved.     Sternocleidomastoid stretch: Place hand on collar bone.  Sidebend neck to opposite side then rotate up.   Hold stretch for 20 seconds and repeat 2x daily.  Flexibility: Neck Retraction    Pull head straight back, keeping eyes and jaw level. Repeat 10 times per set. Do 2 sessions per day.  http://orth.exer.us/345   Copyright  VHI. All rights reserved.

## 2017-07-26 ENCOUNTER — Encounter (HOSPITAL_COMMUNITY): Payer: Self-pay

## 2017-07-26 ENCOUNTER — Ambulatory Visit (HOSPITAL_COMMUNITY): Payer: Medicaid Other

## 2017-07-26 DIAGNOSIS — M542 Cervicalgia: Secondary | ICD-10-CM

## 2017-07-26 DIAGNOSIS — R293 Abnormal posture: Secondary | ICD-10-CM

## 2017-07-26 DIAGNOSIS — R519 Headache, unspecified: Secondary | ICD-10-CM

## 2017-07-26 DIAGNOSIS — R51 Headache: Secondary | ICD-10-CM

## 2017-07-26 NOTE — Therapy (Signed)
[place], [place], [address] Phone: [phone]   Fax:  [phone]  Physical Therapy Treatment  Patient Details  Name: [name] MRN: [id number] Date of Birth: 05-08-1956 Referring Provider: Aline Brochure   Encounter Date: 07/26/2017  PT End of Session - 07/26/17 1309    Visit Number  3    Number of Visits  7    Date for PT Re-Evaluation  08/09/17    Authorization Type  [place] 6 sessions 1/17-2/12/2017    Authorization Time Period  1/17-2/12/2017 for 6 sessions    Authorization - Visit Number  2    Authorization - Number of Visits  6    PT Start Time  9450    PT Stop Time  1342    PT Time Calculation (min)  40 min    Activity Tolerance  Patient tolerated treatment well;No increased pain    Behavior During Therapy  WFL for tasks assessed/performed       Past Medical History:  Diagnosis Date  . Anxiety   . Bipolar 1 disorder ([place])   . COPD (chronic obstructive pulmonary disease) ([place])   . Depression   . Emphysema of lung ([place])   . GERD (gastroesophageal reflux disease)   . Hyperlipidemia   . Hypertension   . Pneumonia 2015  . Vaginal Pap smear, abnormal     Past Surgical History:  Procedure Laterality Date  . CHOLECYSTECTOMY    . COLONOSCOPY  July 2010   Dr. Arnoldo Morale: 3 rectal polyps, not enough tissue for pathologic examination, recommended surveillance in 3 years  . COLONOSCOPY N/A 10/23/2014   RMR: Multiple colonic polyps removed as described above. No endoscopic explaniation for abdominal pain. however. next tcs 10/2019  . ESOPHAGOGASTRODUODENOSCOPY  July 2010   Dr. Arnoldo Morale: gastritis and duodenitis, H.pylori negative  . ESOPHAGOGASTRODUODENOSCOPY N/A 10/23/2014   RMR: Normal EGD. Status post passage of a Maloney dilator. Today's finding s would not explain abdominal pain  . HERNIA REPAIR     Dr. Arnoldo Morale  . KIDNEY STONE SURGERY    . MALONEY DILATION N/A 10/23/2014    Procedure: Venia Minks DILATION;  Surgeon: Daneil Dolin, MD;  [facility];  Service: Endoscopy;  Laterality: N/A;  . OPEN REDUCTION INTERNAL FIXATION (ORIF) DISTAL RADIAL FRACTURE Right 12/09/2015   Procedure: OPEN REDUCTION INTERNAL FIXATION (ORIF) RIGHT DISTAL RADIUS;  Surgeon: Leanora Cover, MD;  Location: [facility];  Service: Orthopedics;  Laterality: Right;    There were no vitals filed for this visit.  Subjective Assessment - 07/26/17 1306    Subjective  Pt stated she has headache pain today 5/10    Pertinent History  right radial fracture with ORIF in 2017    Patient Stated Goals  decrease pain and headache frequency    Currently in Pain?  Yes    Pain Score  5     Pain Location  Head headache    Pain Descriptors / Indicators  Aching    Pain Type  Chronic pain    Pain Onset  More than a month ago    Pain Frequency  Constant    Aggravating Factors   looking down    Pain Relieving Factors  movement helps medication    Effect of Pain on Daily Activities  minimal                      [facility] Adult PT Treatment/Exercise - 07/26/17  0001      Neck Exercises: Theraband   Shoulder Extension  10 reps;Red    Rows  10 reps;Red      Neck Exercises: Seated   Neck Retraction  10 reps;3 secs    X to V  10 reps    W Back  10 reps    Other Seated Exercise  scapular retraction 10x       Manual Therapy   Manual Therapy  Soft tissue mobilization    Manual therapy comments  Manual complete separate than rest of tx    Soft tissue mobilization  Upper traps, scalenes, levator scapular, SCM and fascial ; suboccipital release 3x 30" and manual traction; supine position with LE elevated      Neck Exercises: Stretches   Upper Trapezius Stretch  2 reps;30 seconds    Corner Stretch  2 reps;30 seconds    Other Neck Stretches  SCM Bil 2x 30"               PT Short Term Goals - 07/12/17 1756      PT SHORT TERM GOAL #1   Title  Patient will be  independent with HEP and will be able to demosntrate proper exercises form to increase independence with functional strengthening and activity.    Time  2    Period  Weeks    Status  New    Target Date  07/26/17        PT Long Term Goals - 07/12/17 1802      PT LONG TERM GOAL #1   Title  Patient will report 50% improved reduction of frequency in headaches and report improved activity tolerance to daily activites with pain of less than or equal to a max of 2/10.    Time  4    Period  Weeks    Status  New    Target Date  08/09/17      PT LONG TERM GOAL #2   Title  Patient will improve her NDI score by 10 points to demonstrate a clincally meaningful change in decreasing disability realted to her neck pain.    Time  4    Period  Weeks    Status  New      PT LONG TERM GOAL #3   Title  Patient will have no tenderness to palpation with suboccipitals and no radiation of headache with palpation to suboccipitals to demonstrate idecreased myofascial pain to improve QOL.    Time  4    Period  Weeks    Status  New            Plan - 07/26/17 1348    Clinical Impression Statement  Session focus on postural strengthening and cervical mobilityi.  Reviewed form with HEP stretches with good form presented with UT, moderate cueing for SCM stretches,  Progressed postural strengthening with seated and theraband exercises with minimal cueing for form.  EOS with manual soft tissue mobilization to address restrictions in cervical musculature, added fascial work and continued with suboccipital release/manual traction to assist with headache reduction.  No reports of pain thorugh session.  Pt educated on benefits of hyrdation to assist with headaches.      Rehab Potential  Good    PT Frequency  2x / week    PT Duration  4 weeks    PT Treatment/Interventions  ADLs/Self Care Home Management;Moist Heat;Cryotherapy;Functional mobility training;Therapeutic activities;Neuromuscular  re-education;Patient/family education;Manual techniques;Passive range of motion;Dry needling;Taping;Electrical Stimulation    PT  Next Visit Plan  Next session begin with posterior UBE, scapular retraction with theraband and pect stretch.  Continue manual STM for paraspinals of C/T spine and upper traps with suboccipital release techniques.  Initiate PA to thoracic spine and T spine openers.      PT Home Exercise Plan  upper trap stretch, SCM stretch, deep cervical retraction (chin tuck).       Patient will benefit from skilled therapeutic intervention in order to improve the following deficits and impairments:  Increased fascial restricitons, Improper body mechanics, Pain, Postural dysfunction, Decreased activity tolerance, Decreased endurance, Hypomobility, Impaired flexibility  Visit Diagnosis: Cervicalgia  Chronic intractable headache, unspecified headache type  Abnormal posture     Problem List Patient Active Problem List   Diagnosis Date Noted  . Alcohol abuse 08/01/2015  . Anorgasmia of female 01/01/2015  . Low grade squamous intraepithelial lesion (LGSIL) on Papanicolaou smear of cervix 12/04/2014  . Abdominal pain, chronic, epigastric 11/20/2014  . Dysphagia, pharyngoesophageal phase   . Hx of colonic polyps   . COPD exacerbation ([place]) 07/05/2014  . GERD without esophagitis 07/05/2014  . Hypertension 07/05/2014  . Bipolar 1 disorder ([place]) 07/05/2014  . Anxiety 07/05/2014  . Polysubstance abuse ([place]) 07/05/2014  . Obesity (BMI 30-39.9) 07/05/2014  . Encephalopathy, metabolic 72/82/0601  . Personal history of colonic polyps 04/01/2014  . Mild dysplasia of cervix 11/13/2013  . Benzodiazepine dependence ([place]) 08/26/2012  . COPD (chronic obstructive pulmonary disease) ([place]) 08/25/2012  . Panic disorder 08/25/2012  . Tobacco use disorder 08/25/2012  . Hyperlipidemia 08/25/2012   Ihor Austin, [place]; [place]  Aldona Lento 07/26/2017, 1:58 PM  [place], [place], [address] Phone: [phone]   Fax:  [phone]  Name: [name] MRN: [id number] Date of Birth: 1956-06-05

## 2017-07-28 ENCOUNTER — Telehealth (HOSPITAL_COMMUNITY): Payer: Self-pay | Admitting: Internal Medicine

## 2017-07-28 ENCOUNTER — Ambulatory Visit (HOSPITAL_COMMUNITY): Payer: Medicaid Other

## 2017-07-28 NOTE — Telephone Encounter (Signed)
07/28/17  Pt called to cx but no reason was given

## 2017-08-01 ENCOUNTER — Telehealth (HOSPITAL_COMMUNITY): Payer: Self-pay

## 2017-08-01 NOTE — Telephone Encounter (Signed)
Her mother is dying and she will not be here tomorrow or Thursday. R/S for next week and will keep [place] posted on what happens.

## 2017-08-02 ENCOUNTER — Ambulatory Visit (HOSPITAL_COMMUNITY): Payer: Medicaid Other

## 2017-08-04 ENCOUNTER — Ambulatory Visit (HOSPITAL_COMMUNITY): Payer: Medicaid Other

## 2017-08-10 ENCOUNTER — Telehealth (HOSPITAL_COMMUNITY): Payer: Self-pay

## 2017-08-10 ENCOUNTER — Ambulatory Visit (HOSPITAL_COMMUNITY): Payer: Medicaid Other | Attending: Orthopedic Surgery

## 2017-08-10 NOTE — Telephone Encounter (Signed)
No Show #1: I called Ms. Notz on the mobile phone number on her file. She did not answer and I left a message letting her know she had missed her appointment today that was scheduled for 1:45 PM. I informed her that we do not have any more appointments for her at this time and that if she would like to continue with therapy to call our front office at [phone] to reschedule. I informed her that he medicaid authorization is up and if she does want to reschedule to continue with therapy that we will need to send in a re-authorization which may take extra time.  Kipp Brood, PT, DPT Physical Therapist with [facility]  08/10/2017 2:16 PM

## 2017-08-10 NOTE — Telephone Encounter (Signed)
Pt's mom died Tuesday 08/09/17 - she will call us back to r/s after the funeral. Pls put pt on hold. NF 08/10/17

## 2017-08-12 ENCOUNTER — Telehealth: Payer: Self-pay | Admitting: Orthopedic Surgery

## 2017-08-12 MED ORDER — NABUMETONE 500 MG PO TABS: 500.0000 mg | ORAL_TABLET | Freq: Two times a day (BID) | ORAL | 5 refills | Status: DC

## 2017-08-12 MED ORDER — TRAMADOL-ACETAMINOPHEN 37.5-325 MG PO TABS: 1.0000 | ORAL_TABLET | Freq: Four times a day (QID) | ORAL | 0 refills | Status: DC | PRN

## 2017-08-12 NOTE — Telephone Encounter (Signed)
Switch to Relafen 500 twice a day and Ultracet 1 tablet every 6 as needed pain #28

## 2017-08-12 NOTE — Telephone Encounter (Signed)
Patient called to request for something to be prescribed  - said "nothing strong, possibly Tramadol" for pain in knees.  States pharmacy is [facility]. Chart notes do not show that medication had been prescribed previously.  Please advise.

## 2017-08-12 NOTE — Telephone Encounter (Signed)
I called patient, is she using the diclofenac? She states she is using this, but not helping much with the neck pain, you sent her for Physical therapy but she had to cancel this week, since her mother has passed  She is asking if there is anything you can offer her for the neck pain she is having, in addition to the Diclofenac,  please advise.

## 2017-08-12 NOTE — Telephone Encounter (Signed)
Called in, and advised patient. LM

## 2017-08-18 ENCOUNTER — Other Ambulatory Visit (HOSPITAL_COMMUNITY): Payer: Self-pay | Admitting: Pulmonary Disease

## 2017-08-18 ENCOUNTER — Ambulatory Visit (HOSPITAL_COMMUNITY)
Admission: RE | Admit: 2017-08-18 | Discharge: 2017-08-18 | Disposition: A | Discharge status: Home / Self Care | Payer: Medicaid Other | Source: Ambulatory Visit | Attending: Pulmonary Disease | Admitting: Pulmonary Disease

## 2017-08-18 DIAGNOSIS — J440 Chronic obstructive pulmonary disease with acute lower respiratory infection: Secondary | ICD-10-CM | POA: Diagnosis not present

## 2017-08-18 DIAGNOSIS — R059 Cough, unspecified: Secondary | ICD-10-CM

## 2017-08-18 DIAGNOSIS — J189 Pneumonia, unspecified organism: Secondary | ICD-10-CM | POA: Insufficient documentation

## 2017-08-18 DIAGNOSIS — R05 Cough: Secondary | ICD-10-CM | POA: Diagnosis present

## 2017-08-25 ENCOUNTER — Encounter (HOSPITAL_COMMUNITY): Payer: Self-pay | Admitting: Emergency Medicine

## 2017-08-25 ENCOUNTER — Inpatient Hospital Stay (HOSPITAL_COMMUNITY)
Admission: EM | Admit: 2017-08-25 | Discharge: 2017-08-31 | DRG: 190 | Disposition: A | Discharge status: Home Health | Payer: Medicaid Other | Attending: Internal Medicine | Admitting: Internal Medicine

## 2017-08-25 ENCOUNTER — Emergency Department (HOSPITAL_COMMUNITY): Payer: Medicaid Other

## 2017-08-25 ENCOUNTER — Other Ambulatory Visit: Payer: Self-pay

## 2017-08-25 DIAGNOSIS — K219 Gastro-esophageal reflux disease without esophagitis: Secondary | ICD-10-CM | POA: Diagnosis present

## 2017-08-25 DIAGNOSIS — Z9981 Dependence on supplemental oxygen: Secondary | ICD-10-CM | POA: Diagnosis not present

## 2017-08-25 DIAGNOSIS — J441 Chronic obstructive pulmonary disease with (acute) exacerbation: Principal | ICD-10-CM | POA: Diagnosis present

## 2017-08-25 DIAGNOSIS — J440 Chronic obstructive pulmonary disease with acute lower respiratory infection: Secondary | ICD-10-CM | POA: Diagnosis present

## 2017-08-25 DIAGNOSIS — G8929 Other chronic pain: Secondary | ICD-10-CM | POA: Diagnosis present

## 2017-08-25 DIAGNOSIS — E1165 Type 2 diabetes mellitus with hyperglycemia: Secondary | ICD-10-CM | POA: Diagnosis present

## 2017-08-25 DIAGNOSIS — E871 Hypo-osmolality and hyponatremia: Secondary | ICD-10-CM | POA: Diagnosis present

## 2017-08-25 DIAGNOSIS — E119 Type 2 diabetes mellitus without complications: Secondary | ICD-10-CM

## 2017-08-25 DIAGNOSIS — Z79890 Hormone replacement therapy (postmenopausal): Secondary | ICD-10-CM | POA: Diagnosis not present

## 2017-08-25 DIAGNOSIS — F419 Anxiety disorder, unspecified: Secondary | ICD-10-CM | POA: Diagnosis present

## 2017-08-25 DIAGNOSIS — I10 Essential (primary) hypertension: Secondary | ICD-10-CM | POA: Diagnosis present

## 2017-08-25 DIAGNOSIS — R609 Edema, unspecified: Secondary | ICD-10-CM

## 2017-08-25 DIAGNOSIS — J219 Acute bronchiolitis, unspecified: Secondary | ICD-10-CM | POA: Diagnosis present

## 2017-08-25 DIAGNOSIS — J84115 Respiratory bronchiolitis interstitial lung disease: Secondary | ICD-10-CM | POA: Diagnosis not present

## 2017-08-25 DIAGNOSIS — J9601 Acute respiratory failure with hypoxia: Secondary | ICD-10-CM

## 2017-08-25 DIAGNOSIS — R739 Hyperglycemia, unspecified: Secondary | ICD-10-CM | POA: Diagnosis present

## 2017-08-25 DIAGNOSIS — E861 Hypovolemia: Secondary | ICD-10-CM | POA: Diagnosis present

## 2017-08-25 DIAGNOSIS — F17210 Nicotine dependence, cigarettes, uncomplicated: Secondary | ICD-10-CM | POA: Diagnosis present

## 2017-08-25 LAB — BASIC METABOLIC PANEL
Anion gap: 11 (ref 5–15)
BUN: 21 mg/dL — ABNORMAL HIGH (ref 6–20)
CHLORIDE: 95 mmol/L — AB (ref 101–111)
CO2: 26 mmol/L (ref 22–32)
Calcium: 10.5 mg/dL — ABNORMAL HIGH (ref 8.9–10.3)
Creatinine, Ser: 0.74 mg/dL (ref 0.44–1.00)
GFR calc non Af Amer: >60 mL/min (ref >60)
GLUCOSE: 189 mg/dL — AB (ref 65–99)
Potassium: 4.2 mmol/L (ref 3.5–5.1)
Sodium: 132 mmol/L — ABNORMAL LOW (ref 135–145)

## 2017-08-25 LAB — DIFFERENTIAL
BASOS ABS: 0 10*3/uL (ref 0.0–0.1)
BASOS PCT: 0 %
Eosinophils Absolute: 0 10*3/uL (ref 0.0–0.7)
Eosinophils Relative: 0 %
LYMPHS ABS: 1.9 10*3/uL (ref 0.7–4.0)
LYMPHS PCT: 13 %
MONO ABS: 1.8 10*3/uL — AB (ref 0.1–1.0)
MONOS PCT: 13 %
Neutro Abs: 10.8 10*3/uL — ABNORMAL HIGH (ref 1.7–7.7)
Neutrophils Relative %: 75 %

## 2017-08-25 LAB — BRAIN NATRIURETIC PEPTIDE: B NATRIURETIC PEPTIDE 5: 51 pg/mL (ref 0.0–100.0)

## 2017-08-25 LAB — HEPATIC FUNCTION PANEL
ALBUMIN: 3.1 g/dL — AB (ref 3.5–5.0)
ALK PHOS: 75 U/L (ref 38–126)
ALT: 19 U/L (ref 14–54)
AST: 20 U/L (ref 15–41)
Total Bilirubin: 0.2 mg/dL — ABNORMAL LOW (ref 0.3–1.2)
Total Protein: 7 g/dL (ref 6.5–8.1)

## 2017-08-25 LAB — INFLUENZA PANEL BY PCR (TYPE A & B)
Influenza A By PCR: NEGATIVE
Influenza B By PCR: NEGATIVE

## 2017-08-25 LAB — CBC
HCT: 39 % (ref 36.0–46.0)
Hemoglobin: 13.2 g/dL (ref 12.0–15.0)
MCH: 31.8 pg (ref 26.0–34.0)
MCHC: 33.8 g/dL (ref 30.0–36.0)
MCV: 94 fL (ref 78.0–100.0)
PLATELETS: 295 10*3/uL (ref 150–400)
RBC: 4.15 MIL/uL (ref 3.87–5.11)
RDW: 13.8 % (ref 11.5–15.5)
WBC: 14.5 10*3/uL — ABNORMAL HIGH (ref 4.0–10.5)

## 2017-08-25 LAB — TROPONIN I: Troponin I: <0.03 ng/mL (ref <0.03)

## 2017-08-25 LAB — PROCALCITONIN: Procalcitonin: 0.11 ng/mL

## 2017-08-25 MED ORDER — BISACODYL 5 MG PO TBEC: 5.0000 mg | DELAYED_RELEASE_TABLET | Freq: Every day | ORAL | Status: DC | PRN

## 2017-08-25 MED ORDER — FLUTICASONE PROPIONATE 50 MCG/ACT NA SUSP
1.0000 | Freq: Two times a day (BID) | NASAL | Status: DC | PRN
  Administered 2017-08-26: 1 via NASAL
  Filled 2017-08-25: qty 16

## 2017-08-25 MED ORDER — PRAVASTATIN SODIUM 40 MG PO TABS
40.0000 mg | ORAL_TABLET | Freq: Every day | ORAL | Status: DC
  Administered 2017-08-26 – 2017-08-30 (×5): 40 mg via ORAL
  Filled 2017-08-25 (×6): qty 1

## 2017-08-25 MED ORDER — BACLOFEN 10 MG PO TABS
10.0000 mg | ORAL_TABLET | Freq: Three times a day (TID) | ORAL | Status: DC
  Administered 2017-08-26 – 2017-08-31 (×16): 10 mg via ORAL
  Filled 2017-08-25 (×16): qty 1

## 2017-08-25 MED ORDER — ACETAMINOPHEN 650 MG RE SUPP: 650.0000 mg | Freq: Four times a day (QID) | RECTAL | Status: DC | PRN

## 2017-08-25 MED ORDER — SODIUM CHLORIDE 0.9 % IV SOLN: 250.0000 mL | INTRAVENOUS | Status: DC | PRN

## 2017-08-25 MED ORDER — IPRATROPIUM-ALBUTEROL 0.5-2.5 (3) MG/3ML IN SOLN
3.0000 mL | Freq: Once | RESPIRATORY_TRACT | Status: AC
  Administered 2017-08-25: 3 mL via RESPIRATORY_TRACT
  Filled 2017-08-25: qty 3

## 2017-08-25 MED ORDER — IPRATROPIUM-ALBUTEROL 0.5-2.5 (3) MG/3ML IN SOLN
3.0000 mL | Freq: Three times a day (TID) | RESPIRATORY_TRACT | Status: DC
  Administered 2017-08-26 – 2017-08-28 (×10): 3 mL via RESPIRATORY_TRACT
  Filled 2017-08-25 (×10): qty 3

## 2017-08-25 MED ORDER — METHYLPREDNISOLONE SODIUM SUCC 125 MG IJ SOLR
125.0000 mg | Freq: Once | INTRAMUSCULAR | Status: AC
  Administered 2017-08-25: 125 mg via INTRAVENOUS
  Filled 2017-08-25: qty 2

## 2017-08-25 MED ORDER — ENOXAPARIN SODIUM 40 MG/0.4ML ~~LOC~~ SOLN
40.0000 mg | SUBCUTANEOUS | Status: DC
  Administered 2017-08-26 – 2017-08-31 (×6): 40 mg via SUBCUTANEOUS
  Filled 2017-08-25 (×6): qty 0.4

## 2017-08-25 MED ORDER — SENNOSIDES-DOCUSATE SODIUM 8.6-50 MG PO TABS: 1.0000 | ORAL_TABLET | Freq: Every evening | ORAL | Status: DC | PRN

## 2017-08-25 MED ORDER — INSULIN ASPART 100 UNIT/ML ~~LOC~~ SOLN
0.0000 [IU] | Freq: Three times a day (TID) | SUBCUTANEOUS | Status: DC
  Administered 2017-08-26: 7 [IU] via SUBCUTANEOUS

## 2017-08-25 MED ORDER — METHYLPREDNISOLONE SODIUM SUCC 40 MG IJ SOLR
40.0000 mg | Freq: Three times a day (TID) | INTRAMUSCULAR | Status: DC
  Administered 2017-08-26: 40 mg via INTRAVENOUS
  Filled 2017-08-25: qty 1

## 2017-08-25 MED ORDER — SODIUM CHLORIDE 0.9% FLUSH: 3.0000 mL | INTRAVENOUS | Status: DC | PRN

## 2017-08-25 MED ORDER — ONDANSETRON HCL 4 MG PO TABS: 4.0000 mg | ORAL_TABLET | Freq: Four times a day (QID) | ORAL | Status: DC | PRN

## 2017-08-25 MED ORDER — SODIUM CHLORIDE 0.9% FLUSH
3.0000 mL | Freq: Two times a day (BID) | INTRAVENOUS | Status: DC
  Administered 2017-08-26 – 2017-08-31 (×12): 3 mL via INTRAVENOUS

## 2017-08-25 MED ORDER — LEVOFLOXACIN IN D5W 500 MG/100ML IV SOLN
500.0000 mg | Freq: Once | INTRAVENOUS | Status: AC
  Administered 2017-08-25: 500 mg via INTRAVENOUS
  Filled 2017-08-25: qty 100

## 2017-08-25 MED ORDER — HYDROCHLOROTHIAZIDE 25 MG PO TABS
25.0000 mg | ORAL_TABLET | Freq: Every day | ORAL | Status: DC
  Administered 2017-08-26 – 2017-08-27 (×2): 25 mg via ORAL
  Filled 2017-08-25 (×2): qty 1

## 2017-08-25 MED ORDER — FUROSEMIDE 10 MG/ML IJ SOLN
40.0000 mg | Freq: Once | INTRAMUSCULAR | Status: AC
  Administered 2017-08-25: 40 mg via INTRAVENOUS
  Filled 2017-08-25: qty 4

## 2017-08-25 MED ORDER — NABUMETONE 500 MG PO TABS
500.0000 mg | ORAL_TABLET | Freq: Two times a day (BID) | ORAL | Status: DC
  Administered 2017-08-26 – 2017-08-31 (×11): 500 mg via ORAL
  Filled 2017-08-25 (×16): qty 1

## 2017-08-25 MED ORDER — SODIUM CHLORIDE 0.9 % IV BOLUS (SEPSIS)
1000.0000 mL | Freq: Once | INTRAVENOUS | Status: AC
  Administered 2017-08-25: 1000 mL via INTRAVENOUS

## 2017-08-25 MED ORDER — INSULIN ASPART 100 UNIT/ML ~~LOC~~ SOLN
0.0000 [IU] | Freq: Every day | SUBCUTANEOUS | Status: DC
  Administered 2017-08-26: 4 [IU] via SUBCUTANEOUS

## 2017-08-25 MED ORDER — LISINOPRIL 10 MG PO TABS
20.0000 mg | ORAL_TABLET | Freq: Every day | ORAL | Status: DC
  Administered 2017-08-26 – 2017-08-31 (×6): 20 mg via ORAL
  Filled 2017-08-25 (×6): qty 2

## 2017-08-25 MED ORDER — TRAMADOL-ACETAMINOPHEN 37.5-325 MG PO TABS
1.0000 | ORAL_TABLET | Freq: Four times a day (QID) | ORAL | Status: DC | PRN
  Administered 2017-08-26 – 2017-08-28 (×5): 1 via ORAL
  Administered 2017-08-29: 2 via ORAL
  Administered 2017-08-29: 1 via ORAL
  Filled 2017-08-25: qty 2
  Filled 2017-08-25 (×2): qty 1
  Filled 2017-08-25: qty 2
  Filled 2017-08-25 (×3): qty 1
  Filled 2017-08-25: qty 2

## 2017-08-25 MED ORDER — LEVOFLOXACIN IN D5W 750 MG/150ML IV SOLN
750.0000 mg | INTRAVENOUS | Status: DC
  Administered 2017-08-26 – 2017-08-28 (×3): 750 mg via INTRAVENOUS
  Filled 2017-08-25 (×3): qty 150

## 2017-08-25 MED ORDER — ACETAMINOPHEN 325 MG PO TABS
650.0000 mg | ORAL_TABLET | Freq: Four times a day (QID) | ORAL | Status: DC | PRN
  Administered 2017-08-25 – 2017-08-30 (×4): 650 mg via ORAL
  Filled 2017-08-25 (×4): qty 2

## 2017-08-25 MED ORDER — ALBUTEROL SULFATE (2.5 MG/3ML) 0.083% IN NEBU
2.5000 mg | INHALATION_SOLUTION | Freq: Once | RESPIRATORY_TRACT | Status: AC
  Administered 2017-08-25: 2.5 mg via RESPIRATORY_TRACT
  Filled 2017-08-25: qty 3

## 2017-08-25 MED ORDER — ALBUTEROL SULFATE (2.5 MG/3ML) 0.083% IN NEBU
5.0000 mg | INHALATION_SOLUTION | Freq: Once | RESPIRATORY_TRACT | Status: AC
  Administered 2017-08-25: 5 mg via RESPIRATORY_TRACT
  Filled 2017-08-25: qty 6

## 2017-08-25 MED ORDER — ONDANSETRON HCL 4 MG/2ML IJ SOLN: 4.0000 mg | Freq: Four times a day (QID) | INTRAMUSCULAR | Status: DC | PRN

## 2017-08-25 MED ORDER — ALBUTEROL SULFATE (2.5 MG/3ML) 0.083% IN NEBU
2.5000 mg | INHALATION_SOLUTION | RESPIRATORY_TRACT | Status: DC | PRN
  Administered 2017-08-26 – 2017-08-27 (×2): 2.5 mg via RESPIRATORY_TRACT
  Filled 2017-08-25 (×2): qty 3

## 2017-08-25 NOTE — H&P (Signed)
History and Physical    [name] VOJ:[id number] DOB: Sep 18, 1955 DOA: 08/25/2017  PCP: Jani Gravel, MD   Patient coming from: Home  Chief Complaint: SOB, cough, wheezing   HPI: [name] is a 62 y.o. female with medical history significant for COPD, hypertension, and chronic pain, now presenting to the emergency department for evaluation of cough, dyspnea, and wheezing.  Patient reports that she had been in her usual state of health until days ago when she noted the insidious development of nonproductive cough with shortness of breath.  Over the ensuing days, symptoms have worsened significantly and her cough has become productive of scant thick sputum.  She denies fevers or chills and denies lower extremity swelling or tenderness.  No chest pain.  She now reports dyspnea at rest.  She has been using her home nebulizer unit with very little relief.  ED Course: Upon arrival to the ED, patient is found to be afebrile, saturating low 90s on room air, tachypneic in the 30s, slightly tachycardic, and with blood pressure of 91/53.  EKG features a normal sinus rhythm and chest x-ray is notable for increased reticular opacities, possibly reflecting interstitial edema or atypical pneumonitis.  Chemistry panel is notable for sodium of 132 and elevated BUN to creatinine ratio.  CBC features a leukocytosis to 14,500.  Influenza PCR is negative.  Troponin is undetectable and BNP is normal.  She was given a liter of normal saline, 125 mg IV Solu-Medrol, empiric Levaquin, DuoNeb x2 and albuterol neb x2 in the ED.  She has improved somewhat, but continues to be dyspneic at rest and will be admitted to the medical-surgical unit for ongoing evaluation and management exacerbation in COPD.  Review of Systems:  All other systems reviewed and apart from HPI, are negative.  Past Medical History:  Diagnosis Date  . Anxiety   . Bipolar 1 disorder ([place])   . COPD (chronic obstructive pulmonary  disease) ([place])   . Depression   . Emphysema of lung ([place])   . GERD (gastroesophageal reflux disease)   . Hyperlipidemia   . Hypertension   . Pneumonia 2015  . Vaginal Pap smear, abnormal     Past Surgical History:  Procedure Laterality Date  . CHOLECYSTECTOMY    . COLONOSCOPY  July 2010   Dr. Arnoldo Morale: 3 rectal polyps, not enough tissue for pathologic examination, recommended surveillance in 3 years  . COLONOSCOPY N/A 10/23/2014   RMR: Multiple colonic polyps removed as described above. No endoscopic explaniation for abdominal pain. however. next tcs 10/2019  . ESOPHAGOGASTRODUODENOSCOPY  July 2010   Dr. Arnoldo Morale: gastritis and duodenitis, H.pylori negative  . ESOPHAGOGASTRODUODENOSCOPY N/A 10/23/2014   RMR: Normal EGD. Status post passage of a Maloney dilator. Today's finding s would not explain abdominal pain  . HERNIA REPAIR     Dr. Arnoldo Morale  . KIDNEY STONE SURGERY    . MALONEY DILATION N/A 10/23/2014   Procedure: Venia Minks DILATION;  Surgeon: Daneil Dolin, MD;  [facility];  Service: Endoscopy;  Laterality: N/A;  . OPEN REDUCTION INTERNAL FIXATION (ORIF) DISTAL RADIAL FRACTURE Right 12/09/2015   Procedure: OPEN REDUCTION INTERNAL FIXATION (ORIF) RIGHT DISTAL RADIUS;  Surgeon: Leanora Cover, MD;  Location: [facility];  Service: Orthopedics;  Laterality: Right;     reports that she has been smoking cigarettes.  She has a 21.00 pack-year smoking history. she has never used smokeless tobacco. She reports that she drinks about 0.6 oz of alcohol per week. She reports  that she does not use drugs.  Allergies  Allergen Reactions  . Penicillins Other (See Comments)    Patient is unsure if she allergic to penicillin or septra. Patient states one or another caused "rib pain with a little breathing problem". Has patient had a PCN reaction causing immediate rash, facial/tongue/throat swelling, SOB or lightheadedness with hypotension: YES Has patient had a PCN reaction  causing severe rash involving mucus membranes or skin necrosis: NO Has patient had a PCN reaction that required hospitalization: NO Has patient had a PCN reaction occurring within the last 10 years: NO  . Septra [Sulfamethoxazole-Trimethoprim] Other (See Comments)    Patient is unsure if she allergic to septra or penicillin. Patient states one or another caused "rib pain with a little breathing problem".    Family History  Adopted: Yes     Prior to Admission medications   Medication Sig Start Date End Date Taking? Authorizing Provider  albuterol (PROVENTIL) (2.5 MG/3ML) 0.083% nebulizer solution Take 2.5 mg by nebulization every 6 (six) hours as needed for wheezing or shortness of breath.    [provider]  albuterol-ipratropium (COMBIVENT) 18-103 MCG/ACT inhaler Inhale into the lungs every 4 (four) hours.    [provider]  baclofen (LIORESAL) 10 MG tablet Take 10 mg by mouth 3 (three) times daily.    [provider]  conjugated estrogens (PREMARIN) vaginal cream Place 1 Applicatorful vaginally daily. 1 gram daily 10/25/16   Florian Buff, MD  fluticasone [facility]) 50 MCG/ACT nasal spray Place 1 spray into both nostrils 2 (two) times daily as needed for allergies or rhinitis. 07/18/15   Dettinger, Fransisca Kaufmann, MD  furosemide (LASIX) 40 MG tablet Take 1 tablet (40 mg total) by mouth daily. 08/01/15   Hassell Done, Mary-Margaret, FNP  hydrochlorothiazide (HYDRODIURIL) 25 MG tablet Take 25 mg by mouth daily.    [provider]  lidocaine (XYLOCAINE) 2 % solution TAKE 15 MLS BY MOUTH EVERY 6 HOURS AS NEEDED. 07/19/16   Annitta Needs, NP  lisinopril (PRINIVIL,ZESTRIL) 20 MG tablet TAKE ONE TABLET BY MOUTH DAILY. 03/02/16   Hassell Done, Mary-Margaret, FNP  lovastatin (MEVACOR) 40 MG tablet Take 40 mg by mouth at bedtime.    [provider]  nabumetone (RELAFEN) 500 MG tablet Take 1 tablet (500 mg total) by mouth 2 (two) times daily. 08/12/17   Carole Civil, MD    nitroGLYCERIN (NITROSTAT) 0.4 MG SL tablet DISSOLVE 1 TABLET UNDER THE TONGUE EVERY 5 MINUTES IF NEEDED FOR CHEST PAIN. MAX 3 DOSES THEN CALL 911. 09/09/15   Arnoldo Lenis, MD  promethazine (PHENERGAN) 25 MG tablet TAKE (1) TABLET BY MOUTH EVERY SIX HOURS AS NEEDED. Patient not taking: Reported on 07/12/2017 05/12/16   Annitta Needs, NP  traMADol-acetaminophen (ULTRACET) 37.5-325 MG tablet Take 1 tablet by mouth every 6 (six) hours as needed. 08/12/17   Carole Civil, MD    Physical Exam: Vitals:   08/25/17 2100 08/25/17 2129 08/25/17 2132 08/25/17 2148  BP: (!) 117/54  (!) 96/59 (!) 91/53  Pulse: (!) 103  95 (!) 105  Resp: (!) 32  (!) 22 (!) 25  Temp:      TempSrc:      SpO2: 96% 94% 94% 92%      Constitutional: NAD, calm  Eyes: PERTLA, lids and conjunctivae normal ENMT: Mucous membranes are moist. Posterior pharynx clear of any exudate or lesions.   Neck: normal, supple, no masses, no thyromegaly Respiratory: Diminished breath sounds bilaterally. Prolonged expiratory  phase. Expiratory wheezes. No accessory muscle use.  Cardiovascular: Rate ~110 and regular. No extremity edema. No significant JVD. Abdomen: No distension, no tenderness, no masses palpated. Bowel sounds normal.  Musculoskeletal: no clubbing / cyanosis. No joint deformity upper and lower extremities. Normal muscle tone.  Skin: no significant rashes, lesions, ulcers. Warm, dry, well-perfused. Neurologic: CN 2-12 grossly intact. Sensation intact. Strength 5/5 in all 4 limbs.  Psychiatric:  Alert and oriented x 3. Pleasant and cooperative.     Labs on Admission: I have personally reviewed following labs and imaging studies  CBC: Recent Labs  Lab 08/25/17 1959  WBC 14.5*  NEUTROABS 10.8*  HGB 13.2  HCT 39.0  MCV 94.0  PLT 341   Basic Metabolic Panel: Recent Labs  Lab 08/25/17 1959  NA 132*  K 4.2  CL 95*  CO2 26  GLUCOSE 189*  BUN 21*  CREATININE 0.74  CALCIUM 10.5*   GFR: CrCl cannot be  calculated (Unknown ideal weight.). Liver Function Tests: Recent Labs  Lab 08/25/17 1959  AST 20  ALT 19  ALKPHOS 75  BILITOT 0.2*  PROT 7.0  ALBUMIN 3.1*   No results for input(s): LIPASE, AMYLASE in the last 168 hours. No results for input(s): AMMONIA in the last 168 hours. Coagulation Profile: No results for input(s): INR, PROTIME in the last 168 hours. Cardiac Enzymes: Recent Labs  Lab 08/25/17 1959  TROPONINI <0.03   BNP (last 3 results) No results for input(s): PROBNP in the last 8760 hours. HbA1C: No results for input(s): HGBA1C in the last 72 hours. CBG: No results for input(s): GLUCAP in the last 168 hours. Lipid Profile: No results for input(s): CHOL, HDL, LDLCALC, TRIG, CHOLHDL, LDLDIRECT in the last 72 hours. Thyroid Function Tests: No results for input(s): TSH, T4TOTAL, FREET4, T3FREE, THYROIDAB in the last 72 hours. Anemia Panel: No results for input(s): VITAMINB12, FOLATE, FERRITIN, TIBC, IRON, RETICCTPCT in the last 72 hours. Urine analysis:    Component Value Date/Time   COLORURINE STRAW (A) 09/17/2014 2143   APPEARANCEUR CLEAR 09/17/2014 2143   LABSPEC <1.005 (L) 09/17/2014 2143   PHURINE 6.0 09/17/2014 2143   GLUCOSEU NEGATIVE 09/17/2014 2143   HGBUR NEGATIVE 09/17/2014 2143   BILIRUBINUR negative 08/01/2015 1122   KETONESUR NEGATIVE 09/17/2014 2143   PROTEINUR negative 08/01/2015 1122   PROTEINUR NEGATIVE 09/17/2014 2143   UROBILINOGEN negative 08/01/2015 1122   UROBILINOGEN 0.2 09/17/2014 2143   NITRITE negative 08/01/2015 1122   NITRITE NEGATIVE 09/17/2014 2143   LEUKOCYTESUR Negative 08/01/2015 1122   Sepsis Labs: @LABRCNTIP (procalcitonin:4,lacticidven:4) )No results found for this or any previous visit (from the past 240 hour(s)).   Radiological Exams on Admission: Dg Chest Portable 1 View  Result Date: 08/25/2017 CLINICAL DATA:  62 y/o F; cough, mild chest pain, and shortness of breath for 9 days. History of COPD, hypertension,  pneumonia, emphysema, current smoker. EXAM: PORTABLE CHEST 1 VIEW COMPARISON:  08/18/2017 chest radiograph FINDINGS: Stable heart size and mediastinal contours are within normal limits. Emphysema. Increased reticular opacities of the lungs. No focal consolidation, effusion, or pneumothorax. The visualized skeletal structures are unremarkable. IMPRESSION: Increased reticular opacities, possibly represent interstitial edema or atypical pneumonitis. COPD. Electronically Signed   By: Kristine Garbe M.D.   On: 08/25/2017 20:18    EKG: Independently reviewed. Normal sinus rhythm.   Assessment/Plan   1. COPD exacerbation  - Presents with 1 week of progressive cough, SOB, and wheezing  - In respiratory distress on presentation, afebrile, CXR with increased reticular markings, leukocytosis  noted, influenza PCR negative  - Suspected secondary to atypical infection, possibly viral  - Treated in ED with nebs, systemic steroid, and Levaquin  - Check sputum culture, strep pneumo and legionella antigen, trend procalcitonin, continue steroids, continue nebs, continue abx with Levaquin, prn supplemental O2   2. Hypertension  - BP at goal  - Continue lisinopril and HCTZ as tolerated   3. Chronic pain  - Stable  - Continue home regimen with scheduled Relafen and prn Ultracet    4. Hyponatremia  - Serum sodium is 132 on admission in setting of hypovolemia and HCTZ use  - Treated with 1 liter NS in ED  - Repeat chem panel in am     DVT prophylaxis: Lovenox Code Status: Full  Family Communication: Discussed with patient Disposition Plan: Admit to med-surg Consults called: None Admission status: Inpatient    Vianne Bulls, MD [facility] Pager [phone]  If 7PM-7AM, please contact night-coverage [email or web address] Password [email or web address]  08/25/2017, 10:14 PM

## 2017-08-25 NOTE — ED Triage Notes (Signed)
Pt c/o sob and coughing x 9 days.

## 2017-08-25 NOTE — ED Provider Notes (Signed)
[facility] EMERGENCY DEPARTMENT Provider Note   CSN: [id number] Arrival date & time: 08/25/17  1919     History   Chief Complaint Chief Complaint  Patient presents with  . Shortness of Breath    HPI [name] is a 62 y.o. female.  Patient complains of shortness of breath and wheezing.  Patient been coughing for a week.  No fever no chills   The history is provided by the patient.  Shortness of Breath  This is a new problem. The problem occurs frequently.The current episode started 1 to 2 hours ago. The problem has not changed since onset.Pertinent negatives include no fever, no headaches, no cough, no chest pain, no abdominal pain and no rash.    Past Medical History:  Diagnosis Date  . Anxiety   . Bipolar 1 disorder ([place])   . COPD (chronic obstructive pulmonary disease) ([place])   . Depression   . Emphysema of lung ([place])   . GERD (gastroesophageal reflux disease)   . Hyperlipidemia   . Hypertension   . Pneumonia 2015  . Vaginal Pap smear, abnormal     Patient Active Problem List   Diagnosis Date Noted  . Alcohol abuse 08/01/2015  . Anorgasmia of female 01/01/2015  . Low grade squamous intraepithelial lesion (LGSIL) on Papanicolaou smear of cervix 12/04/2014  . Abdominal pain, chronic, epigastric 11/20/2014  . Dysphagia, pharyngoesophageal phase   . Hx of colonic polyps   . COPD exacerbation ([place]) 07/05/2014  . GERD without esophagitis 07/05/2014  . Hypertension 07/05/2014  . Bipolar 1 disorder ([place]) 07/05/2014  . Anxiety 07/05/2014  . Polysubstance abuse ([place]) 07/05/2014  . Obesity (BMI 30-39.9) 07/05/2014  . Encephalopathy, metabolic 48/18/5631  . Personal history of colonic polyps 04/01/2014  . Mild dysplasia of cervix 11/13/2013  . Benzodiazepine dependence ([place]) 08/26/2012  . COPD (chronic obstructive pulmonary disease) ([place]) 08/25/2012  . Panic disorder 08/25/2012  . Tobacco use disorder 08/25/2012  . Hyperlipidemia 08/25/2012    Past  Surgical History:  Procedure Laterality Date  . CHOLECYSTECTOMY    . COLONOSCOPY  July 2010   Dr. Arnoldo Morale: 3 rectal polyps, not enough tissue for pathologic examination, recommended surveillance in 3 years  . COLONOSCOPY N/A 10/23/2014   RMR: Multiple colonic polyps removed as described above. No endoscopic explaniation for abdominal pain. however. next tcs 10/2019  . ESOPHAGOGASTRODUODENOSCOPY  July 2010   Dr. Arnoldo Morale: gastritis and duodenitis, H.pylori negative  . ESOPHAGOGASTRODUODENOSCOPY N/A 10/23/2014   RMR: Normal EGD. Status post passage of a Maloney dilator. Today's finding s would not explain abdominal pain  . HERNIA REPAIR     Dr. Arnoldo Morale  . KIDNEY STONE SURGERY    . MALONEY DILATION N/A 10/23/2014   Procedure: Venia Minks DILATION;  Surgeon: Daneil Dolin, MD;  Location: [facility];  Service: Endoscopy;  Laterality: N/A;  . OPEN REDUCTION INTERNAL FIXATION (ORIF) DISTAL RADIAL FRACTURE Right 12/09/2015   Procedure: OPEN REDUCTION INTERNAL FIXATION (ORIF) RIGHT DISTAL RADIUS;  Surgeon: Leanora Cover, MD;  Location: [facility];  Service: Orthopedics;  Laterality: Right;    OB History    No data available       Home Medications    Prior to Admission medications   Medication Sig Start Date End Date Taking? Authorizing Provider  albuterol (PROVENTIL) (2.5 MG/3ML) 0.083% nebulizer solution Take 2.5 mg by nebulization every 6 (six) hours as needed for wheezing or shortness of breath.    [provider]  albuterol-ipratropium (COMBIVENT) 18-103 MCG/ACT  inhaler Inhale into the lungs every 4 (four) hours.    [provider]  baclofen (LIORESAL) 10 MG tablet Take 10 mg by mouth 3 (three) times daily.    [provider]  conjugated estrogens (PREMARIN) vaginal cream Place 1 Applicatorful vaginally daily. 1 gram daily 10/25/16   Florian Buff, MD  fluticasone [facility]) 50 MCG/ACT nasal spray Place 1 spray into both nostrils 2 (two) times daily as  needed for allergies or rhinitis. 07/18/15   Dettinger, Fransisca Kaufmann, MD  furosemide (LASIX) 40 MG tablet Take 1 tablet (40 mg total) by mouth daily. 08/01/15   Hassell Done, Mary-Margaret, FNP  hydrochlorothiazide (HYDRODIURIL) 25 MG tablet Take 25 mg by mouth daily.    [provider]  lidocaine (XYLOCAINE) 2 % solution TAKE 15 MLS BY MOUTH EVERY 6 HOURS AS NEEDED. 07/19/16   Annitta Needs, NP  lisinopril (PRINIVIL,ZESTRIL) 20 MG tablet TAKE ONE TABLET BY MOUTH DAILY. 03/02/16   Hassell Done, Mary-Margaret, FNP  lovastatin (MEVACOR) 40 MG tablet Take 40 mg by mouth at bedtime.    [provider]  nabumetone (RELAFEN) 500 MG tablet Take 1 tablet (500 mg total) by mouth 2 (two) times daily. 08/12/17   Carole Civil, MD  nitroGLYCERIN (NITROSTAT) 0.4 MG SL tablet DISSOLVE 1 TABLET UNDER THE TONGUE EVERY 5 MINUTES IF NEEDED FOR CHEST PAIN. MAX 3 DOSES THEN CALL 911. 09/09/15   Arnoldo Lenis, MD  promethazine (PHENERGAN) 25 MG tablet TAKE (1) TABLET BY MOUTH EVERY SIX HOURS AS NEEDED. Patient not taking: Reported on 07/12/2017 05/12/16   Annitta Needs, NP  traMADol-acetaminophen (ULTRACET) 37.5-325 MG tablet Take 1 tablet by mouth every 6 (six) hours as needed. 08/12/17   Carole Civil, MD    Family History Family History  Adopted: Yes    Social History Social History   Tobacco Use  . Smoking status: Current Some Day Smoker    Packs/day: 0.50    Years: 42.00    Pack years: 21.00    Types: Cigarettes  . Smokeless tobacco: Never Used  . Tobacco comment: 1/2 pack daily  Substance Use Topics  . Alcohol use: Yes    Alcohol/week: 0.6 oz    Types: 1 Standard drinks or equivalent per week    Comment: quit 11/03/2014. used to drink couple of 40 ounces.  . Drug use: No     Allergies   Penicillins and Septra [sulfamethoxazole-trimethoprim]   Review of Systems Review of Systems  Constitutional: Negative for appetite change, fatigue and fever.  HENT: Negative for congestion, ear  discharge and sinus pressure.   Eyes: Negative for discharge.  Respiratory: Positive for shortness of breath. Negative for cough.   Cardiovascular: Negative for chest pain.  Gastrointestinal: Negative for abdominal pain and diarrhea.  Genitourinary: Negative for frequency and hematuria.  Musculoskeletal: Negative for back pain.  Skin: Negative for rash.  Neurological: Negative for seizures and headaches.  Psychiatric/Behavioral: Negative for hallucinations.     Physical Exam Updated Vital Signs BP (!) 91/53   Pulse (!) 105   Temp 99.5 F (37.5 C) (Oral)   Resp (!) 25   SpO2 92%   Physical Exam  Constitutional: She is oriented to person, place, and time. She appears well-developed.  HENT:  Head: Normocephalic.  Eyes: Conjunctivae and EOM are normal. No scleral icterus.  Neck: Neck supple. No thyromegaly present.  Cardiovascular: Normal rate and regular rhythm. Exam reveals no gallop and no friction rub.  No murmur heard. Pulmonary/Chest: No  stridor. She has wheezes. She has no rales. She exhibits no tenderness.  Abdominal: She exhibits no distension. There is no tenderness. There is no rebound.  Musculoskeletal: Normal range of motion. She exhibits no edema.  Lymphadenopathy:    She has no cervical adenopathy.  Neurological: She is oriented to person, place, and time. She exhibits normal muscle tone. Coordination normal.  Skin: No rash noted. No erythema.  Psychiatric: She has a normal mood and affect. Her behavior is normal.     ED Treatments / Results  Labs (all labs ordered are listed, but only abnormal results are displayed) Labs Reviewed  DIFFERENTIAL - Abnormal; Notable for the following components:      Result Value   Neutro Abs 10.8 (*)    Monocytes Absolute 1.8 (*)    All other components within normal limits  HEPATIC FUNCTION PANEL - Abnormal; Notable for the following components:   Albumin 3.1 (*)    Total Bilirubin 0.2 (*)    Bilirubin, Direct <0.1 (*)     All other components within normal limits  BASIC METABOLIC PANEL - Abnormal; Notable for the following components:   Sodium 132 (*)    Chloride 95 (*)    Glucose, Bld 189 (*)    BUN 21 (*)    Calcium 10.5 (*)    All other components within normal limits  CBC - Abnormal; Notable for the following components:   WBC 14.5 (*)    All other components within normal limits  TROPONIN I  INFLUENZA PANEL BY PCR (TYPE A & B)  BRAIN NATRIURETIC PEPTIDE    EKG  EKG Interpretation  Date/Time:  Thursday August 25 2017 19:38:14 EST Ventricular Rate:  95 PR Interval:  148 QRS Duration: 76 QT Interval:  322 QTC Calculation: 404 R Axis:   69 Text Interpretation:  Normal sinus rhythm Biatrial enlargement Abnormal ECG Confirmed by Milton Ferguson [phone]) on 08/25/2017 7:56:10 PM       Radiology Dg Chest Portable 1 View  Result Date: 08/25/2017 CLINICAL DATA:  62 y/o F; cough, mild chest pain, and shortness of breath for 9 days. History of COPD, hypertension, pneumonia, emphysema, current smoker. EXAM: PORTABLE CHEST 1 VIEW COMPARISON:  08/18/2017 chest radiograph FINDINGS: Stable heart size and mediastinal contours are within normal limits. Emphysema. Increased reticular opacities of the lungs. No focal consolidation, effusion, or pneumothorax. The visualized skeletal structures are unremarkable. IMPRESSION: Increased reticular opacities, possibly represent interstitial edema or atypical pneumonitis. COPD. Electronically Signed   By: Kristine Garbe M.D.   On: 08/25/2017 20:18    Procedures Procedures (including critical care time)  Medications Ordered in ED Medications  levofloxacin (LEVAQUIN) IVPB 500 mg (500 mg Intravenous New Bag/Given 08/25/17 2139)  sodium chloride 0.9 % bolus 1,000 mL (not administered)  albuterol (PROVENTIL) (2.5 MG/3ML) 0.083% nebulizer solution 5 mg (5 mg Nebulization Given 08/25/17 2026)  ipratropium-albuterol (DUONEB) 0.5-2.5 (3) MG/3ML nebulizer  solution 3 mL (3 mLs Nebulization Given 08/25/17 2035)  methylPREDNISolone sodium succinate (SOLU-MEDROL) 125 mg/2 mL injection 125 mg (125 mg Intravenous Given 08/25/17 2013)  furosemide (LASIX) injection 40 mg (40 mg Intravenous Given 08/25/17 2043)  ipratropium-albuterol (DUONEB) 0.5-2.5 (3) MG/3ML nebulizer solution 3 mL (3 mLs Nebulization Given 08/25/17 2130)  albuterol (PROVENTIL) (2.5 MG/3ML) 0.083% nebulizer solution 2.5 mg (2.5 mg Nebulization Given 08/25/17 2128)     Initial Impression / Assessment and Plan / ED Course  I have reviewed the triage vital signs and the nursing notes.  Pertinent labs & imaging  results that were available during my care of the patient were reviewed by me and considered in my medical decision making (see chart for details). CRITICAL CARE Performed by: Milton Ferguson Total critical care time:45 minutes Critical care time was exclusive of separately billable procedures and treating other patients. Critical care was necessary to treat or prevent imminent or life-threatening deterioration. Critical care was time spent personally by me on the following activities: development of treatment plan with patient and/or surrogate as well as nursing, discussions with consultants, evaluation of patient's response to treatment, examination of patient, obtaining history from patient or surrogate, ordering and performing treatments and interventions, ordering and review of laboratory studies, ordering and review of radiographic studies, pulse oximetry and re-evaluation of patient's condition.    Chest x-ray suggested pneumonitis or congestive heart failure.  Patient's BNP was normal.  Suspect infectious agent causing pneumonitis.  She will be admitted to medicine  Final Clinical Impressions(s) / ED Diagnoses   Final diagnoses:  COPD exacerbation [facility])    ED Discharge Orders    None       Milton Ferguson, MD 08/25/17 2159

## 2017-08-26 ENCOUNTER — Other Ambulatory Visit: Payer: Self-pay

## 2017-08-26 DIAGNOSIS — R739 Hyperglycemia, unspecified: Secondary | ICD-10-CM | POA: Diagnosis present

## 2017-08-26 LAB — CBC WITH DIFFERENTIAL/PLATELET
BASOS ABS: 0 10*3/uL (ref 0.0–0.1)
Basophils Relative: 0 %
EOS PCT: 0 %
Eosinophils Absolute: 0 10*3/uL (ref 0.0–0.7)
HEMATOCRIT: 36.8 % (ref 36.0–46.0)
Hemoglobin: 12.3 g/dL (ref 12.0–15.0)
LYMPHS ABS: 0.6 10*3/uL — AB (ref 0.7–4.0)
Lymphocytes Relative: 5 %
MCH: 31.8 pg (ref 26.0–34.0)
MCHC: 33.4 g/dL (ref 30.0–36.0)
MCV: 95.1 fL (ref 78.0–100.0)
Monocytes Absolute: 0.6 10*3/uL (ref 0.1–1.0)
Monocytes Relative: 5 %
NEUTROS PCT: 90 %
Neutro Abs: 10.7 10*3/uL — ABNORMAL HIGH (ref 1.7–7.7)
PLATELETS: 272 10*3/uL (ref 150–400)
RBC: 3.87 MIL/uL (ref 3.87–5.11)
RDW: 13.8 % (ref 11.5–15.5)
WBC: 11.9 10*3/uL — AB (ref 4.0–10.5)

## 2017-08-26 LAB — GLUCOSE, CAPILLARY
GLUCOSE-CAPILLARY: 315 mg/dL — AB (ref 65–99)
Glucose-Capillary: 197 mg/dL — ABNORMAL HIGH (ref 65–99)
Glucose-Capillary: 321 mg/dL — ABNORMAL HIGH (ref 65–99)
Glucose-Capillary: 344 mg/dL — ABNORMAL HIGH (ref 65–99)
Glucose-Capillary: 345 mg/dL — ABNORMAL HIGH (ref 65–99)

## 2017-08-26 LAB — BASIC METABOLIC PANEL
Anion gap: 13 (ref 5–15)
BUN: 23 mg/dL — AB (ref 6–20)
CALCIUM: 10 mg/dL (ref 8.9–10.3)
CO2: 23 mmol/L (ref 22–32)
CREATININE: 0.84 mg/dL (ref 0.44–1.00)
Chloride: 97 mmol/L — ABNORMAL LOW (ref 101–111)
GFR calc Af Amer: >60 mL/min (ref >60)
GLUCOSE: 369 mg/dL — AB (ref 65–99)
Potassium: 4.5 mmol/L (ref 3.5–5.1)
Sodium: 133 mmol/L — ABNORMAL LOW (ref 135–145)

## 2017-08-26 LAB — PROCALCITONIN: Procalcitonin: <0.1 ng/mL

## 2017-08-26 MED ORDER — INSULIN GLARGINE 100 UNIT/ML ~~LOC~~ SOLN
10.0000 [IU] | Freq: Every day | SUBCUTANEOUS | Status: DC
  Administered 2017-08-27: 10 [IU] via SUBCUTANEOUS
  Filled 2017-08-26 (×3): qty 0.1

## 2017-08-26 MED ORDER — INSULIN ASPART 100 UNIT/ML ~~LOC~~ SOLN
0.0000 [IU] | Freq: Every day | SUBCUTANEOUS | Status: DC
  Administered 2017-08-26 – 2017-08-27 (×2): 4 [IU] via SUBCUTANEOUS
  Administered 2017-08-28 – 2017-08-30 (×2): 3 [IU] via SUBCUTANEOUS

## 2017-08-26 MED ORDER — NICOTINE 21 MG/24HR TD PT24
21.0000 mg | MEDICATED_PATCH | Freq: Every day | TRANSDERMAL | Status: DC
  Administered 2017-08-26 – 2017-08-31 (×6): 21 mg via TRANSDERMAL
  Filled 2017-08-26 (×6): qty 1

## 2017-08-26 MED ORDER — LORATADINE 10 MG PO TABS
10.0000 mg | ORAL_TABLET | Freq: Every day | ORAL | Status: DC
  Administered 2017-08-26 – 2017-08-31 (×6): 10 mg via ORAL
  Filled 2017-08-26 (×6): qty 1

## 2017-08-26 MED ORDER — GI COCKTAIL ~~LOC~~
30.0000 mL | Freq: Three times a day (TID) | ORAL | Status: DC | PRN
  Administered 2017-08-27 – 2017-08-28 (×3): 30 mL via ORAL
  Filled 2017-08-26 (×3): qty 30

## 2017-08-26 MED ORDER — METHYLPREDNISOLONE SODIUM SUCC 125 MG IJ SOLR: 60.0000 mg | Freq: Three times a day (TID) | INTRAMUSCULAR | Status: DC

## 2017-08-26 MED ORDER — ORAL CARE MOUTH RINSE
15.0000 mL | Freq: Two times a day (BID) | OROMUCOSAL | Status: DC
  Administered 2017-08-26 – 2017-08-31 (×9): 15 mL via OROMUCOSAL

## 2017-08-26 MED ORDER — ARFORMOTEROL TARTRATE 15 MCG/2ML IN NEBU
15.0000 ug | INHALATION_SOLUTION | Freq: Two times a day (BID) | RESPIRATORY_TRACT | Status: DC
  Administered 2017-08-26 – 2017-08-31 (×10): 15 ug via RESPIRATORY_TRACT
  Filled 2017-08-26 (×10): qty 2

## 2017-08-26 MED ORDER — INSULIN ASPART 100 UNIT/ML ~~LOC~~ SOLN
0.0000 [IU] | Freq: Three times a day (TID) | SUBCUTANEOUS | Status: DC
  Administered 2017-08-26: 15 [IU] via SUBCUTANEOUS
  Administered 2017-08-26 – 2017-08-27 (×2): 4 [IU] via SUBCUTANEOUS
  Administered 2017-08-27 (×2): 15 [IU] via SUBCUTANEOUS
  Administered 2017-08-28: 11 [IU] via SUBCUTANEOUS
  Administered 2017-08-28: 15 [IU] via SUBCUTANEOUS
  Administered 2017-08-28 – 2017-08-29 (×2): 11 [IU] via SUBCUTANEOUS
  Administered 2017-08-29: 3 [IU] via SUBCUTANEOUS
  Administered 2017-08-29 – 2017-08-30 (×2): 15 [IU] via SUBCUTANEOUS
  Administered 2017-08-30 – 2017-08-31 (×2): 11 [IU] via SUBCUTANEOUS
  Administered 2017-08-31: 15 [IU] via SUBCUTANEOUS

## 2017-08-26 MED ORDER — LORAZEPAM 0.5 MG PO TABS
0.5000 mg | ORAL_TABLET | Freq: Every day | ORAL | Status: DC | PRN
  Administered 2017-08-26 – 2017-08-27 (×2): 0.5 mg via ORAL
  Filled 2017-08-26 (×3): qty 1

## 2017-08-26 MED ORDER — GUAIFENESIN ER 600 MG PO TB12
1200.0000 mg | ORAL_TABLET | Freq: Two times a day (BID) | ORAL | Status: DC
  Administered 2017-08-26 – 2017-08-31 (×11): 1200 mg via ORAL
  Filled 2017-08-26 (×11): qty 2

## 2017-08-26 MED ORDER — INSULIN GLARGINE 100 UNIT/ML ~~LOC~~ SOLN
5.0000 [IU] | Freq: Every day | SUBCUTANEOUS | Status: DC
  Administered 2017-08-26: 5 [IU] via SUBCUTANEOUS
  Filled 2017-08-26 (×4): qty 0.05

## 2017-08-26 MED ORDER — HYDROCODONE-HOMATROPINE 5-1.5 MG/5ML PO SYRP
5.0000 mL | ORAL_SOLUTION | Freq: Four times a day (QID) | ORAL | Status: DC | PRN
Start: 2017-08-26 — End: 2017-08-31
  Administered 2017-08-27 – 2017-08-30 (×4): 5 mL via ORAL
  Filled 2017-08-26 (×4): qty 5

## 2017-08-26 MED ORDER — LORAZEPAM 0.5 MG PO TABS
0.5000 mg | ORAL_TABLET | Freq: Once | ORAL | Status: AC
Start: 2017-08-26 — End: 2017-08-26
  Administered 2017-08-26: 0.5 mg via ORAL

## 2017-08-26 MED ORDER — FUROSEMIDE 10 MG/ML IJ SOLN
20.0000 mg | Freq: Once | INTRAMUSCULAR | Status: AC
  Administered 2017-08-26: 20 mg via INTRAVENOUS
  Filled 2017-08-26: qty 2

## 2017-08-26 MED ORDER — HYDROXYZINE HCL 25 MG PO TABS
25.0000 mg | ORAL_TABLET | Freq: Three times a day (TID) | ORAL | Status: DC | PRN
  Administered 2017-08-26: 25 mg via ORAL
  Filled 2017-08-26: qty 1

## 2017-08-26 MED ORDER — INSULIN GLARGINE 100 UNIT/ML ~~LOC~~ SOLN
5.0000 [IU] | Freq: Once | SUBCUTANEOUS | Status: AC
  Administered 2017-08-26: 5 [IU] via SUBCUTANEOUS
  Filled 2017-08-26: qty 0.05

## 2017-08-26 MED ORDER — PANTOPRAZOLE SODIUM 40 MG PO TBEC
40.0000 mg | DELAYED_RELEASE_TABLET | Freq: Every day | ORAL | Status: DC
  Administered 2017-08-26 – 2017-08-31 (×6): 40 mg via ORAL
  Filled 2017-08-26 (×6): qty 1

## 2017-08-26 MED ORDER — METHYLPREDNISOLONE SODIUM SUCC 125 MG IJ SOLR
80.0000 mg | Freq: Three times a day (TID) | INTRAMUSCULAR | Status: DC
  Administered 2017-08-26 – 2017-08-27 (×4): 80 mg via INTRAVENOUS
  Filled 2017-08-26 (×4): qty 2

## 2017-08-26 MED ORDER — BUDESONIDE 0.5 MG/2ML IN SUSP
0.5000 mg | Freq: Two times a day (BID) | RESPIRATORY_TRACT | Status: DC
  Administered 2017-08-26 – 2017-08-31 (×11): 0.5 mg via RESPIRATORY_TRACT
  Filled 2017-08-26 (×11): qty 2

## 2017-08-26 NOTE — Progress Notes (Addendum)
Inpatient Diabetes Program Recommendations  AACE/ADA: New Consensus Statement on Inpatient Glycemic Control (2015)  Target Ranges:  Prepandial:   less than 140 mg/dL      Peak postprandial:   less than 180 mg/dL (1-2 hours)      Critically ill patients:  140 - 180 mg/dL   Results for NOMI, RUDNICKI (MRN [id number]) as of 08/26/2017 11:50  Ref. Range 08/26/2017 00:20 08/26/2017 07:28 08/26/2017 11:39  Glucose-Capillary Latest Ref Range: 65 - 99 mg/dL 345 (H)  4 units Novolog 344 (H)  7 units Novolog +  5 units Lantus 321 (H)     Diabetes history: NONE  Outpatient Diabetes medications: NA  Current orders: Novolog Resistant Correction Scale/ SSI (0-20 units) TID AC + HS     Lantus 5 units daily      MD- Note patient does NOT have History of DM.  Getting Solumedrol 80 mg Q8 hours.  Note that hemoglobin A1c level pending.  Please consider the following if pt to remain on high dose IV steroids:  1. Increase Lantus to 15 units daily (0.15 units/kg dosing based on weight of 103 kg)  2. Reduce Novolog SSI to Moderate scale (0-15 units) TID AC + HS  3. Start Novolog Meal Coverage: Novolog 4 units TID with meals (hold if pt eats <50% of meal)  4. Will need to taper insulin downwards as steroids are reduced     --Will follow patient during hospitalization--  Wyn Quaker RN, MSN, CDE Diabetes Coordinator Inpatient Glycemic Control Team Team Pager: [phone] (8a-5p)
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
## 2017-08-26 NOTE — Progress Notes (Signed)
PROGRESS NOTE    [name]  NWG:[id number] DOB: 1955-11-14 DOA: 08/25/2017 PCP: Jani Gravel, MD   Brief Narrative:  Patient is a 62 year old female history of COPD, hypertension, chronic pain presented to the ED with cough, dyspnea, wheezing, worsening shortness of breath at rest and on exertion.  Patient seen in the ED and noted to be tachypneic with sats in the low 90s.  Patient given a dose of IV Solu-Medrol, Levaquin, duo nebs and albuterol nebs x2.  Patient with ongoing dyspnea at rest and was admitted for management of COPD exacerbation.   Assessment & Plan:   Principal Problem:   COPD with acute exacerbation ([place]) Active Problems:   GERD without esophagitis   Hypertension   Anxiety   Chronic pain   Hyperglycemia  #1 acute COPD exacerbation Questionable etiology.  Patient states no significant improvement with shortness of breath on exertion however slightly improved overall since admission and not at baseline.  Will place patient on Mucinex, Pulmicort, Brovana, Claritin, Protonix.  Continue Flonase.  Increase IV Solu-Medrol to 80 mg every 8 hours.  Continue scheduled duo nebs.  Will give a dose of Lasix 20 mg IV x1.  Continue Levaquin.  Consult with pulmonary for further evaluation and management.  2.  Gastroesophageal reflux without esophagitis Started PPI.  3.  Hyperglycemia Patient with no prior history of diabetes mellitus.  Elevated CBGs likely secondary to IV steroids.  Check a hemoglobin A1c.  Place on sliding scale insulin.  Patient started on 5 units of Lantus will increase to 10 units daily.  Follow.  Taper insulin as steroids are tapered down.  4.  Hypertension Blood pressure stable.  Continue home dose HCTZ and lisinopril.  5.  Anxiety Place on 0.5 mg of Ativan daily as needed.  6.  Chronic pain Stable.  Continue home regimen of scheduled relafen and Ultracet as needed.  7.  Hyponatremia Improving.  Follow for now.   DVT prophylaxis: Lovenox    Code Status full Family Communication: Updated patient.  No family present. Disposition Plan: To be determined, pending PT evaluation.   Consultants:   Pulmonary pending  Procedures:   Chest x-ray 08/25/2017    Antimicrobials:   IV Levaquin 08/25/2017   Subjective: States no significant improvement with her shortness of breath.  Patient complained of anxiety.  Patient stated tried hydroxyzine however no significant improvement with her anxiety.  Patient asking for some Ativan as needed.  Patient denies any chest pain.  States very short of breath on minimal exertion.  Objective: Vitals:   08/26/17 0052 08/26/17 0440 08/26/17 0500 08/26/17 1031  BP: 128/64  (!) 115/56   Pulse: 88  93   Resp: (!) 30  (!) 22   Temp: 98.7 F (37.1 C)  98.6 F (37 C)   TempSrc: Oral  Oral   SpO2: 96% 90% 95% 95%  Weight:   103 kg (227 lb 1.2 oz)     Intake/Output Summary (Last 24 hours) at 08/26/2017 1222 Last data filed at 08/25/2017 2321 Gross per 24 hour  Intake 1100 ml  Output -  Net 1100 ml   Filed Weights   08/26/17 0500  Weight: 103 kg (227 lb 1.2 oz)    Examination:  General exam: Appears calm and comfortable  Respiratory system: Poor to fair air movement.  Tight.  Some expiratory wheezing.  No rhonchi noted.   Cardiovascular system: S1 & S2 heard, RRR. No JVD, murmurs, rubs, gallops or clicks. No pedal edema. Gastrointestinal system:  Abdomen is nondistended, soft and nontender. No organomegaly or masses felt. Normal bowel sounds heard. Central nervous system: Alert and oriented. No focal neurological deficits. Extremities: Symmetric 5 x 5 power. Skin: No rashes, lesions or ulcers Psychiatry: Judgement and insight appear normal. Mood & affect appropriate.     Data Reviewed: I have personally reviewed following labs and imaging studies  CBC: Recent Labs  Lab 08/25/17 1959 08/26/17 0511  WBC 14.5* 11.9*  NEUTROABS 10.8* 10.7*  HGB 13.2 12.3  HCT 39.0 36.8  MCV  94.0 95.1  PLT 295 161   Basic Metabolic Panel: Recent Labs  Lab 08/25/17 1959 08/26/17 0511  NA 132* 133*  K 4.2 4.5  CL 95* 97*  CO2 26 23  GLUCOSE 189* 369*  BUN 21* 23*  CREATININE 0.74 0.84  CALCIUM 10.5* 10.0   GFR: Estimated Creatinine Clearance: 81.1 mL/min (by C-G formula based on SCr of 0.84 mg/dL). Liver Function Tests: Recent Labs  Lab 08/25/17 1959  AST 20  ALT 19  ALKPHOS 75  BILITOT 0.2*  PROT 7.0  ALBUMIN 3.1*   No results for input(s): LIPASE, AMYLASE in the last 168 hours. No results for input(s): AMMONIA in the last 168 hours. Coagulation Profile: No results for input(s): INR, PROTIME in the last 168 hours. Cardiac Enzymes: Recent Labs  Lab 08/25/17 1959  TROPONINI <0.03   BNP (last 3 results) No results for input(s): PROBNP in the last 8760 hours. HbA1C: No results for input(s): HGBA1C in the last 72 hours. CBG: Recent Labs  Lab 08/26/17 0020 08/26/17 0728 08/26/17 1139  GLUCAP 345* 344* 321*   Lipid Profile: No results for input(s): CHOL, HDL, LDLCALC, TRIG, CHOLHDL, LDLDIRECT in the last 72 hours. Thyroid Function Tests: No results for input(s): TSH, T4TOTAL, FREET4, T3FREE, THYROIDAB in the last 72 hours. Anemia Panel: No results for input(s): VITAMINB12, FOLATE, FERRITIN, TIBC, IRON, RETICCTPCT in the last 72 hours. Sepsis Labs: Recent Labs  Lab 08/25/17 1959 08/26/17 0511  PROCALCITON 0.11 <0.10    No results found for this or any previous visit (from the past 240 hour(s)).       Radiology Studies: Dg Chest Portable 1 View  Result Date: 08/25/2017 CLINICAL DATA:  62 y/o F; cough, mild chest pain, and shortness of breath for 9 days. History of COPD, hypertension, pneumonia, emphysema, current smoker. EXAM: PORTABLE CHEST 1 VIEW COMPARISON:  08/18/2017 chest radiograph FINDINGS: Stable heart size and mediastinal contours are within normal limits. Emphysema. Increased reticular opacities of the lungs. No focal  consolidation, effusion, or pneumothorax. The visualized skeletal structures are unremarkable. IMPRESSION: Increased reticular opacities, possibly represent interstitial edema or atypical pneumonitis. COPD. Electronically Signed   By: Kristine Garbe M.D.   On: 08/25/2017 20:18        Scheduled Meds: . baclofen  10 mg Oral TID  . budesonide (PULMICORT) nebulizer solution  0.5 mg Nebulization BID  . enoxaparin (LOVENOX) injection  40 mg Subcutaneous Q24H  . guaiFENesin  1,200 mg Oral BID  . hydrochlorothiazide  25 mg Oral Daily  . insulin aspart  0-20 Units Subcutaneous TID WC  . insulin aspart  0-5 Units Subcutaneous QHS  . insulin glargine  5 Units Subcutaneous Daily  . ipratropium-albuterol  3 mL Inhalation TID  . lisinopril  20 mg Oral Daily  . loratadine  10 mg Oral Daily  . mouth rinse  15 mL Mouth Rinse BID  . methylPREDNISolone (SOLU-MEDROL) injection  60 mg Intravenous Q8H  . nabumetone  500 mg  Oral BID  . nicotine  21 mg Transdermal Daily  . pantoprazole  40 mg Oral Q0600  . pravastatin  40 mg Oral q1800  . sodium chloride flush  3 mL Intravenous Q12H   Continuous Infusions: . sodium chloride    . levofloxacin (LEVAQUIN) IV       LOS: 1 day    Time spent: 35 mins    Irine Seal, MD [facility] Pager [phone]  If 7PM-7AM, please contact night-coverage [email or web address] Password [facility] 08/26/2017, 12:22 PM

## 2017-08-26 NOTE — Care Management Note (Signed)
Case Management Note  Patient Details  Name: INAAYA VELLUCCI MRN: [id number] Date of Birth: 02-Dec-1955  Subjective/Objective:    Adm with COPD exacerbation. From home with husband. Ind with ADL's. No HH or DME pta. Acutely on oxygen. Has PCP-Dr. Maudie Mercury, see Dr. Luan Pulling also. Has neb machine at home. Uses [facility] for transportation. Has Medicaid.                Action/Plan:DC home with self care. Will need to wean to room air or have home O2 eval.    Expected Discharge Date:    08/28/2017              Expected Discharge Plan:  Home/Self Care  In-House Referral:     Discharge planning Services  CM Consult  Post Acute Care Choice:    Choice offered to:  NA  DME Arranged:    DME Agency:     HH Arranged:    HH Agency:     Status of Service:  In process, will continue to follow  If discussed at Long Length of Stay Meetings, dates discussed:    Additional Comments:  Brunilda Eble, Chauncey Reading, RN 08/26/2017, 12:32 PM

## 2017-08-27 ENCOUNTER — Inpatient Hospital Stay (HOSPITAL_COMMUNITY): Payer: Medicaid Other

## 2017-08-27 LAB — BASIC METABOLIC PANEL
Anion gap: 10 (ref 5–15)
BUN: 28 mg/dL — AB (ref 6–20)
CALCIUM: 9.9 mg/dL (ref 8.9–10.3)
CHLORIDE: 91 mmol/L — AB (ref 101–111)
CO2: 27 mmol/L (ref 22–32)
CREATININE: 0.7 mg/dL (ref 0.44–1.00)
GFR calc non Af Amer: >60 mL/min (ref >60)
Glucose, Bld: 314 mg/dL — ABNORMAL HIGH (ref 65–99)
Potassium: 4.2 mmol/L (ref 3.5–5.1)
SODIUM: 128 mmol/L — AB (ref 135–145)

## 2017-08-27 LAB — GLUCOSE, CAPILLARY
GLUCOSE-CAPILLARY: 297 mg/dL — AB (ref 65–99)
GLUCOSE-CAPILLARY: 352 mg/dL — AB (ref 65–99)
GLUCOSE-CAPILLARY: 328 mg/dL — AB (ref 65–99)
Glucose-Capillary: 313 mg/dL — ABNORMAL HIGH (ref 65–99)

## 2017-08-27 LAB — HIV ANTIBODY (ROUTINE TESTING W REFLEX): HIV SCREEN 4TH GENERATION: NONREACTIVE

## 2017-08-27 LAB — CBC
HCT: 37.6 % (ref 36.0–46.0)
Hemoglobin: 12.4 g/dL (ref 12.0–15.0)
MCH: 31.2 pg (ref 26.0–34.0)
MCHC: 33 g/dL (ref 30.0–36.0)
MCV: 94.7 fL (ref 78.0–100.0)
PLATELETS: 331 10*3/uL (ref 150–400)
RBC: 3.97 MIL/uL (ref 3.87–5.11)
RDW: 13.5 % (ref 11.5–15.5)
WBC: 20.7 10*3/uL — AB (ref 4.0–10.5)

## 2017-08-27 LAB — HEMOGLOBIN A1C
HEMOGLOBIN A1C: 6.8 % — AB (ref 4.8–5.6)
MEAN PLASMA GLUCOSE: 148 mg/dL

## 2017-08-27 LAB — PROCALCITONIN: Procalcitonin: <0.1 ng/mL

## 2017-08-27 MED ORDER — METHYLPREDNISOLONE SODIUM SUCC 125 MG IJ SOLR
60.0000 mg | Freq: Two times a day (BID) | INTRAMUSCULAR | Status: DC
  Administered 2017-08-28 – 2017-08-31 (×7): 60 mg via INTRAVENOUS
  Filled 2017-08-27 (×7): qty 2

## 2017-08-27 MED ORDER — SODIUM CHLORIDE 0.9 % IV SOLN
INTRAVENOUS | Status: DC
  Administered 2017-08-27 (×2): via INTRAVENOUS

## 2017-08-27 MED ORDER — LORAZEPAM 0.5 MG PO TABS
0.5000 mg | ORAL_TABLET | Freq: Three times a day (TID) | ORAL | Status: DC | PRN
  Administered 2017-08-27 – 2017-08-31 (×8): 0.5 mg via ORAL
  Filled 2017-08-27 (×8): qty 1

## 2017-08-27 MED ORDER — IOPAMIDOL (ISOVUE-300) INJECTION 61%
75.0000 mL | Freq: Once | INTRAVENOUS | Status: AC | PRN
  Administered 2017-08-27: 75 mL via INTRAVENOUS

## 2017-08-27 NOTE — Progress Notes (Signed)
PROGRESS NOTE    [name]  FAO:[id number] DOB: [birth year] DOA: 08/25/2017 PCP: Jani Gravel, MD   Brief Narrative:  Patient is a 62 year old female history of COPD, hypertension, chronic pain presented to the ED with cough, dyspnea, wheezing, worsening shortness of breath at rest and on exertion.  Patient seen in the ED and noted to be tachypneic with sats in the low 90s.  Patient given a dose of IV Solu-Medrol, Levaquin, duo nebs and albuterol nebs x2.  Patient with ongoing dyspnea at rest and was admitted for management of COPD exacerbation.   Assessment & Plan:   Principal Problem:   COPD with acute exacerbation ([place]) Active Problems:   GERD without esophagitis   Hypertension   Anxiety   Chronic pain   Hyperglycemia  #1 acute COPD exacerbation Questionable etiology.  Patient states shortness of breath slowly improving since admission however not at baseline.  Patient complained of being anxious thinks may be secondary to steroids.  Slowly clinically improving.  Decrease IV steroids to 60 mg IV every 12 hours.  Continue Mucinex, Pulmicort, Brovana, Claritin, Protonix, Flonase, scheduled duo nebs.  Continue Levaquin.  CT chest has been obtained per pulmonary and patient started on a flutter valve.  Pulmonary following and appreciate input and recommendations.    2.  Gastroesophageal reflux without esophagitis Continue PPI.  3.  Hyperglycemia Patient with no prior history of diabetes mellitus.  Elevated CBGs likely secondary to IV steroids.  Globin A1c at 6.8.  Continue sliding scale insulin.  Continue current regimen of Lantus 5 units daily.  Continue sliding scale insulin.  Continue Lantus 10 units daily.  Sliding scale insulin.  Monitor CBGs closely and titrate insulin with steroid taper.    4.  Hypertension Blood pressure stable.  Continue HCTZ secondary to worsening hyponatremia.  Continue lisinopril.  Follow.  5.  Anxiety Patient is complaining of being anxious  and asking for increased frequency of anxiolytics.  Will change Ativan to 0.5 mg twice daily as needed.   6.  Chronic pain Stable.  Continue home regimen of scheduled relafen and Ultracet as needed.  7.  Hyponatremia Sodium levels trending down and currently at 128.  Will discontinue HCTZ.  Follow.   DVT prophylaxis: Lovenox  Code Status full Family Communication: Updated patient.  No family present. Disposition Plan: To be determined, pending PT evaluation.   Consultants:   Pulmonary: Dr. Luan Pulling 08/27/2017  Procedures:   Chest x-ray 08/25/2017    Antimicrobials:   IV Levaquin 08/25/2017>>>>   Subjective: Patient states some improvement with her shortness of breath and wheezing since admission.  Patient complaining of increased anxiety.  No chest pain.    Objective: Vitals:   08/27/17 0700 08/27/17 0746 08/27/17 0750 08/27/17 0756  BP: 118/64     Pulse: 77     Resp: 20     Temp: 97.9 F (36.6 C)     TempSrc: Oral     SpO2: 95% 95% 95% 95%  Weight: 102.6 kg (226 lb 1.6 oz)     Height:        Intake/Output Summary (Last 24 hours) at 08/27/2017 1231 Last data filed at 08/27/2017 0347 Gross per 24 hour  Intake 390 ml  Output -  Net 390 ml   Filed Weights   08/26/17 0500 08/27/17 0700  Weight: 103 kg (227 lb 1.2 oz) 102.6 kg (226 lb 1.6 oz)    Examination:  General exam: Somewhat anxious. Respiratory system: Fair air movement.  Less tight.  Decreasing expiratory wheezing.  No crackles.  Cardiovascular system: RRR no M/R/G.  No pedal edema.  Gastrointestinal system: Abdomen is soft, nontender, nondistended, positive bowel sounds.  No hepatosplenomegaly.  No rebound.  No guarding.   Central nervous system: Alert and oriented. No focal neurological deficits. Extremities: Symmetric 5 x 5 power. Skin: No rashes, lesions or ulcers Psychiatry: Judgement and insight appear normal. Mood & affect appropriate.     Data Reviewed: I have personally reviewed following  labs and imaging studies  CBC: Recent Labs  Lab 08/25/17 1959 08/26/17 0511 08/27/17 0603  WBC 14.5* 11.9* 20.7*  NEUTROABS 10.8* 10.7*  --   HGB 13.2 12.3 12.4  HCT 39.0 36.8 37.6  MCV 94.0 95.1 94.7  PLT 295 272 923   Basic Metabolic Panel: Recent Labs  Lab 08/25/17 1959 08/26/17 0511 08/27/17 0603  NA 132* 133* 128*  K 4.2 4.5 4.2  CL 95* 97* 91*  CO2 26 23 27   GLUCOSE 189* 369* 314*  BUN 21* 23* 28*  CREATININE 0.74 0.84 0.70  CALCIUM 10.5* 10.0 9.9   GFR: Estimated Creatinine Clearance: 86.6 mL/min (by C-G formula based on SCr of 0.7 mg/dL). Liver Function Tests: Recent Labs  Lab 08/25/17 1959  AST 20  ALT 19  ALKPHOS 75  BILITOT 0.2*  PROT 7.0  ALBUMIN 3.1*   No results for input(s): LIPASE, AMYLASE in the last 168 hours. No results for input(s): AMMONIA in the last 168 hours. Coagulation Profile: No results for input(s): INR, PROTIME in the last 168 hours. Cardiac Enzymes: Recent Labs  Lab 08/25/17 1959  TROPONINI <0.03   BNP (last 3 results) No results for input(s): PROBNP in the last 8760 hours. HbA1C: Recent Labs    08/26/17 0859  HGBA1C 6.8*   CBG: Recent Labs  Lab 08/26/17 1139 08/26/17 1630 08/26/17 2033 08/27/17 1136 08/27/17 1138  GLUCAP 321* 197* 315* 297* 352*   Lipid Profile: No results for input(s): CHOL, HDL, LDLCALC, TRIG, CHOLHDL, LDLDIRECT in the last 72 hours. Thyroid Function Tests: No results for input(s): TSH, T4TOTAL, FREET4, T3FREE, THYROIDAB in the last 72 hours. Anemia Panel: No results for input(s): VITAMINB12, FOLATE, FERRITIN, TIBC, IRON, RETICCTPCT in the last 72 hours. Sepsis Labs: Recent Labs  Lab 08/25/17 1959 08/26/17 0511 08/27/17 0603  PROCALCITON 0.11 <0.10 <0.10    No results found for this or any previous visit (from the past 240 hour(s)).       Radiology Studies: Ct Chest W Contrast  Result Date: 08/27/2017 CLINICAL DATA:  Inpatient. Dyspnea. Hypotension. Current smoker. COPD.  EXAM: CT CHEST WITH CONTRAST TECHNIQUE: Multidetector CT imaging of the chest was performed during intravenous contrast administration. CONTRAST:  65mL ISOVUE-300 IOPAMIDOL (ISOVUE-300) INJECTION 61% COMPARISON:  08/25/2017 chest radiograph.  10/12/2013 chest CT. FINDINGS: Cardiovascular: Normal heart size. No significant pericardial fluid/thickening. Three-vessel coronary atherosclerosis. Atherosclerotic nonaneurysmal thoracic aorta. Normal caliber pulmonary arteries. No central pulmonary emboli. Mediastinum/Nodes: No discrete thyroid nodules. Unremarkable esophagus. No pathologically enlarged axillary, mediastinal or hilar lymph nodes. Lungs/Pleura: No pneumothorax. No pleural effusion. No acute consolidative airspace disease, lung masses or significant pulmonary nodules. There is prominent patchy ground-glass opacity throughout both lungs, with relative sparing at the lung bases. No significant regions traction bronchiectasis, architectural distortion or frank honeycombing. Upper abdomen: No acute abnormality. Musculoskeletal: No aggressive appearing focal osseous lesions. Mild thoracic spondylosis. IMPRESSION: 1. Prominent nonspecific patchy ground-glass opacity throughout both lungs with relative sparing at the lung bases. Smoking related interstitial lung disease (respiratory bronchiolitis-interstitial lung disease (RB-ILD))  is a consideration in this current smoker (per EPIC records). Other considerations include acute interstitial pneumonia (AIP), diffuse alveolar hemorrhage, drug toxicity or atypical infection (particularly if the patient is immunocompromised). 2. Three-vessel coronary atherosclerosis. Aortic Atherosclerosis (ICD10-I70.0). Electronically Signed   By: Ilona Sorrel M.D.   On: 08/27/2017 11:45   Dg Chest Portable 1 View  Result Date: 08/25/2017 CLINICAL DATA:  62 y/o F; cough, mild chest pain, and shortness of breath for 9 days. History of COPD, hypertension, pneumonia, emphysema, current  smoker. EXAM: PORTABLE CHEST 1 VIEW COMPARISON:  08/18/2017 chest radiograph FINDINGS: Stable heart size and mediastinal contours are within normal limits. Emphysema. Increased reticular opacities of the lungs. No focal consolidation, effusion, or pneumothorax. The visualized skeletal structures are unremarkable. IMPRESSION: Increased reticular opacities, possibly represent interstitial edema or atypical pneumonitis. COPD. Electronically Signed   By: Kristine Garbe M.D.   On: 08/25/2017 20:18        Scheduled Meds: . arformoterol  15 mcg Nebulization BID  . baclofen  10 mg Oral TID  . budesonide (PULMICORT) nebulizer solution  0.5 mg Nebulization BID  . enoxaparin (LOVENOX) injection  40 mg Subcutaneous Q24H  . guaiFENesin  1,200 mg Oral BID  . insulin aspart  0-20 Units Subcutaneous TID WC  . insulin aspart  0-5 Units Subcutaneous QHS  . insulin glargine  10 Units Subcutaneous Daily  . ipratropium-albuterol  3 mL Inhalation TID  . lisinopril  20 mg Oral Daily  . loratadine  10 mg Oral Daily  . mouth rinse  15 mL Mouth Rinse BID  . methylPREDNISolone (SOLU-MEDROL) injection  80 mg Intravenous Q8H  . nabumetone  500 mg Oral BID  . nicotine  21 mg Transdermal Daily  . pantoprazole  40 mg Oral Q0600  . pravastatin  40 mg Oral q1800  . sodium chloride flush  3 mL Intravenous Q12H   Continuous Infusions: . sodium chloride    . sodium chloride 100 mL/hr at 08/27/17 1212  . levofloxacin (LEVAQUIN) IV Stopped (08/26/17 2237)     LOS: 2 days    Time spent: 35 mins    Irine Seal, MD [facility] Pager [phone]  If 7PM-7AM, please contact night-coverage [email or web address] Password [facility] 08/27/2017, 12:31 PM

## 2017-08-27 NOTE — Consult Note (Signed)
Consult requested by: [facility], Dr. Grandville Silos Consult requested for: COPD exacerbation/respiratory failure  HPI: This is a 62 year old who has severe COPD and who came to the emergency department with shortness of breath and mild hypotension.  She is known to have emphysema.  She is been coughing up some sputum.  She has not had any fever or chills.  She has been using antibiotics steroids and nebulizer at home but it has not made any difference.  She was negative for influenza.  She says she still having a lot of cough and congestion.  No chest pain except with cough.  No nausea vomiting diarrhea.  No definite fever or chills.  No abdominal pain.  She does have a headache.  She is anxious  Past Medical History:  Diagnosis Date  . Anxiety   . Bipolar 1 disorder ([place])   . COPD (chronic obstructive pulmonary disease) ([place])   . Depression   . Emphysema of lung ([place])   . GERD (gastroesophageal reflux disease)   . Hyperlipidemia   . Hypertension   . Pneumonia 2015  . Vaginal Pap smear, abnormal      Family History  Adopted: Yes     Social History   Socioeconomic History  . Marital status: Married    Spouse name: None  . Number of children: None  . Years of education: None  . Highest education level: None  Social Needs  . Financial resource strain: None  . Food insecurity - worry: None  . Food insecurity - inability: None  . Transportation needs - medical: None  . Transportation needs - non-medical: None  Occupational History  . Occupation: disability  Tobacco Use  . Smoking status: Current Some Day Smoker    Packs/day: 0.50    Years: 42.00    Pack years: 21.00    Types: Cigarettes  . Smokeless tobacco: Never Used  . Tobacco comment: 1/2 pack daily  Substance and Sexual Activity  . Alcohol use: Yes    Alcohol/week: 0.6 oz    Types: 1 Standard drinks or equivalent per week    Comment: quit 11/03/2014. used to drink couple of 40 ounces.  . Drug use: No  . Sexual  activity: Yes    Birth control/protection: Post-menopausal  Other Topics Concern  . None  Social History Narrative  . None     ROS: Except as mentioned 10 point review of systems is negative    Objective: Vital signs in last 24 hours: Temp:  [97.7 F (36.5 C)-97.9 F (36.6 C)] 97.9 F (36.6 C) (02/23 0700) Pulse Rate:  [77-88] 77 (02/23 0700) Resp:  [20] 20 (02/23 0700) BP: (105-119)/(64-73) 118/64 (02/23 0700) SpO2:  [93 %-96 %] 95 % (02/23 0756) Weight:  [102.6 kg (226 lb 1.6 oz)] 102.6 kg (226 lb 1.6 oz) (02/23 0700) Weight change: -0.442 kg (-15.6 oz) Last BM Date: 08/24/17  Intake/Output from previous day: 02/22 0701 - 02/23 0700 In: 990 [P.O.:840; IV Piggyback:150] Out: -   PHYSICAL EXAM Constitutional: She is awake and alert and looks anxious.  Eyes: Pupils react EOMI.  Ears nose mouth and throat: Her hearing is grossly normal.  Throat is clear.  Cardiovascular: Her heart is regular with normal heart sounds.  No edema.  Respiratory: Her respiratory effort is increased.  She is coughing during the examination.  She has bilateral wheezing.  Gastrointestinal: Her abdomen is soft obese with no masses.  Skin: Warm and dry musculoskeletal: Grossly normal strength of the upper and lower  extremities bilaterally.  Neurological: No focal abnormalities.  Psychiatric: She is very anxious  Lab Results: Basic Metabolic Panel: Recent Labs    08/26/17 0511 08/27/17 0603  NA 133* 128*  K 4.5 4.2  CL 97* 91*  CO2 23 27  GLUCOSE 369* 314*  BUN 23* 28*  CREATININE 0.84 0.70  CALCIUM 10.0 9.9   Liver Function Tests: Recent Labs    08/25/17 1959  AST 20  ALT 19  ALKPHOS 75  BILITOT 0.2*  PROT 7.0  ALBUMIN 3.1*   No results for input(s): LIPASE, AMYLASE in the last 72 hours. No results for input(s): AMMONIA in the last 72 hours. CBC: Recent Labs    08/25/17 1959 08/26/17 0511 08/27/17 0603  WBC 14.5* 11.9* 20.7*  NEUTROABS 10.8* 10.7*  --   HGB 13.2 12.3 12.4   HCT 39.0 36.8 37.6  MCV 94.0 95.1 94.7  PLT 295 272 331   Cardiac Enzymes: Recent Labs    08/25/17 1959  TROPONINI <0.03   BNP: No results for input(s): PROBNP in the last 72 hours. D-Dimer: No results for input(s): DDIMER in the last 72 hours. CBG: Recent Labs    08/26/17 0020 08/26/17 0728 08/26/17 1139 08/26/17 1630 08/26/17 2033  GLUCAP 345* 344* 321* 197* 315*   Hemoglobin A1C: Recent Labs    08/26/17 0859  HGBA1C 6.8*   Fasting Lipid Panel: No results for input(s): CHOL, HDL, LDLCALC, TRIG, CHOLHDL, LDLDIRECT in the last 72 hours. Thyroid Function Tests: No results for input(s): TSH, T4TOTAL, FREET4, T3FREE, THYROIDAB in the last 72 hours. Anemia Panel: No results for input(s): VITAMINB12, FOLATE, FERRITIN, TIBC, IRON, RETICCTPCT in the last 72 hours. Coagulation: No results for input(s): LABPROT, INR in the last 72 hours. Urine Drug Screen: Drugs of Abuse     Component Value Date/Time   LABOPIA NONE DETECTED 07/05/2014 1120   COCAINSCRNUR POSITIVE (A) 07/05/2014 1120   LABBENZ POSITIVE (A) 07/05/2014 1120   AMPHETMU NONE DETECTED 07/05/2014 1120   THCU NONE DETECTED 07/05/2014 1120   LABBARB NONE DETECTED 07/05/2014 1120    Alcohol Level: No results for input(s): ETH in the last 72 hours. Urinalysis: No results for input(s): COLORURINE, LABSPEC, PHURINE, GLUCOSEU, HGBUR, BILIRUBINUR, KETONESUR, PROTEINUR, UROBILINOGEN, NITRITE, LEUKOCYTESUR in the last 72 hours.  Invalid input(s): APPERANCEUR Misc. Labs:   ABGS: No results for input(s): PHART, PO2ART, TCO2, HCO3 in the last 72 hours.  Invalid input(s): PCO2   MICROBIOLOGY: No results found for this or any previous visit (from the past 240 hour(s)).  Studies/Results: Dg Chest Portable 1 View  Result Date: 08/25/2017 CLINICAL DATA:  62 y/o F; cough, mild chest pain, and shortness of breath for 9 days. History of COPD, hypertension, pneumonia, emphysema, current smoker. EXAM: PORTABLE CHEST  1 VIEW COMPARISON:  08/18/2017 chest radiograph FINDINGS: Stable heart size and mediastinal contours are within normal limits. Emphysema. Increased reticular opacities of the lungs. No focal consolidation, effusion, or pneumothorax. The visualized skeletal structures are unremarkable. IMPRESSION: Increased reticular opacities, possibly represent interstitial edema or atypical pneumonitis. COPD. Electronically Signed   By: Kristine Garbe M.D.   On: 08/25/2017 20:18    Medications:  Prior to Admission:  Medications Prior to Admission  Medication Sig Dispense Refill Last Dose  . albuterol (PROVENTIL HFA;VENTOLIN HFA) 108 (90 Base) MCG/ACT inhaler Inhale 2 puffs into the lungs every 6 (six) hours as needed for wheezing or shortness of breath.   08/25/2017 at Unknown time  . albuterol (PROVENTIL) (2.5 MG/3ML) 0.083% nebulizer solution  Take 2.5 mg by nebulization every 6 (six) hours as needed for wheezing or shortness of breath.   08/25/2017 at Unknown time  . albuterol-ipratropium (COMBIVENT) 18-103 MCG/ACT inhaler Inhale into the lungs every 4 (four) hours.   08/25/2017 at Unknown time  . baclofen (LIORESAL) 10 MG tablet Take 10 mg by mouth 3 (three) times daily.   08/25/2017 at Unknown time  . conjugated estrogens (PREMARIN) vaginal cream Place 1 Applicatorful vaginally daily. 1 gram daily 42.5 g 12 08/25/2017 at Unknown time  . cycloSPORINE (RESTASIS) 0.05 % ophthalmic emulsion Place 1 drop into both eyes 2 (two) times daily.     . fluticasone (FLONASE) 50 MCG/ACT nasal spray Place 1 spray into both nostrils 2 (two) times daily as needed for allergies or rhinitis. 16 g 6 08/25/2017 at Unknown time  . hydrochlorothiazide (HYDRODIURIL) 25 MG tablet Take 25 mg by mouth daily.   08/25/2017 at Unknown time  . lisinopril (PRINIVIL,ZESTRIL) 20 MG tablet TAKE ONE TABLET BY MOUTH DAILY. 30 tablet 0 08/25/2017 at Unknown time  . lovastatin (MEVACOR) 40 MG tablet Take 40 mg by mouth at bedtime.   Past Week at  Unknown time  . nabumetone (RELAFEN) 500 MG tablet Take 1 tablet (500 mg total) by mouth 2 (two) times daily. 60 tablet 5 08/25/2017 at Unknown time  . nitroGLYCERIN (NITROSTAT) 0.4 MG SL tablet DISSOLVE 1 TABLET UNDER THE TONGUE EVERY 5 MINUTES IF NEEDED FOR CHEST PAIN. MAX 3 DOSES THEN CALL 911. 25 tablet 3 Taking  . rOPINIRole (REQUIP) 1 MG tablet Take 1 tablet by mouth at bedtime.      . traMADol-acetaminophen (ULTRACET) 37.5-325 MG tablet Take 1 tablet by mouth every 6 (six) hours as needed. 28 tablet 0 08/25/2017 at Unknown time  . furosemide (LASIX) 40 MG tablet Take 1 tablet (40 mg total) by mouth daily. (Patient not taking: Reported on 08/26/2017) 30 tablet 3 Not Taking at Unknown time  . lidocaine (XYLOCAINE) 2 % solution TAKE 15 MLS BY MOUTH EVERY 6 HOURS AS NEEDED. (Patient not taking: Reported on 08/26/2017) 200 mL 0 Not Taking at Unknown time  . promethazine (PHENERGAN) 25 MG tablet TAKE (1) TABLET BY MOUTH EVERY SIX HOURS AS NEEDED. (Patient not taking: Reported on 07/12/2017) 30 tablet 0 Not Taking at Unknown time   Scheduled: . arformoterol  15 mcg Nebulization BID  . baclofen  10 mg Oral TID  . budesonide (PULMICORT) nebulizer solution  0.5 mg Nebulization BID  . enoxaparin (LOVENOX) injection  40 mg Subcutaneous Q24H  . guaiFENesin  1,200 mg Oral BID  . hydrochlorothiazide  25 mg Oral Daily  . insulin aspart  0-20 Units Subcutaneous TID WC  . insulin aspart  0-5 Units Subcutaneous QHS  . insulin glargine  10 Units Subcutaneous Daily  . ipratropium-albuterol  3 mL Inhalation TID  . lisinopril  20 mg Oral Daily  . loratadine  10 mg Oral Daily  . mouth rinse  15 mL Mouth Rinse BID  . methylPREDNISolone (SOLU-MEDROL) injection  80 mg Intravenous Q8H  . nabumetone  500 mg Oral BID  . nicotine  21 mg Transdermal Daily  . pantoprazole  40 mg Oral Q0600  . pravastatin  40 mg Oral q1800  . sodium chloride flush  3 mL Intravenous Q12H   Continuous: . sodium chloride    .  levofloxacin [facility]) IV Stopped (08/26/17 2237)   GUY:QIHKVQ chloride, acetaminophen **OR** acetaminophen, albuterol, bisacodyl, fluticasone, gi cocktail, HYDROcodone-homatropine, iopamidol, LORazepam, ondansetron **OR** ondansetron (ZOFRAN) IV,  senna-docusate, sodium chloride flush, traMADol-acetaminophen  Assesment: She was admitted with COPD with acute exacerbation.  She has hypoxic respiratory failure.  She is still having trouble producing sputum.  Her chest x-ray which I have personally reviewed showed scattered densities that may be atypical pneumonia.  She is on Levaquin for this which is appropriate.  She is on nebulizer treatments and steroids.  She is very anxious and that seems to be getting worse from her medications.  She has bipolar disease at baseline.  She has chronic pain which is stable. Principal Problem:   COPD with acute exacerbation ([place]) Active Problems:   GERD without esophagitis   Hypertension   Anxiety   Chronic pain   Hyperglycemia    Plan: Continue treatments.  Check CT of the chest.  Continue with Levaquin.  I added a flutter valve.  Thanks for allowing me to see her with you    LOS: 2 days   Alexy Heldt L 08/27/2017, 10:15 AM

## 2017-08-28 ENCOUNTER — Inpatient Hospital Stay (HOSPITAL_COMMUNITY): Payer: Medicaid Other

## 2017-08-28 DIAGNOSIS — J9601 Acute respiratory failure with hypoxia: Secondary | ICD-10-CM

## 2017-08-28 LAB — GLUCOSE, CAPILLARY
Glucose-Capillary: 261 mg/dL — ABNORMAL HIGH (ref 65–99)
Glucose-Capillary: 327 mg/dL — ABNORMAL HIGH (ref 65–99)
Glucose-Capillary: 263 mg/dL — ABNORMAL HIGH (ref 65–99)
Glucose-Capillary: 279 mg/dL — ABNORMAL HIGH (ref 65–99)

## 2017-08-28 LAB — ECHOCARDIOGRAM COMPLETE
AOASC: 32 cm
AVLVOTPG: 5 mmHg
E decel time: 190 msec
EERAT: 12.29
FS: 30 % (ref 28–44)
Height: 65 in
IVS/LV PW RATIO, ED: 0.98
LA ID, A-P, ES: 31 mm
LA diam end sys: 31 mm
LADIAMINDEX: 1.47 cm/m2
LAVOL: 36.6 mL
LAVOLA4C: 38.4 mL
LAVOLIN: 17.3 mL/m2
LDCA: 3.8 cm2
LV E/e'average: 12.29
LV e' LATERAL: 9.68 cm/s
LVEEMED: 12.29
LVOT SV: 92 mL
LVOT VTI: 24.2 cm
LVOT diameter: 22 mm
LVOT peak vel: 116 cm/s
Lateral S' vel: 14.7 cm/s
MV Dec: 190
MVPG: 6 mmHg
MVPKAVEL: 88.8 m/s
MVPKEVEL: 119 m/s
PW: 11 mm — AB (ref 0.6–1.1)
TAPSE: 25.8 mm
TDI e' lateral: 9.68
TDI e' medial: 7.83
Weight: 3717.84 oz

## 2017-08-28 LAB — CBC
HCT: 38.7 % (ref 36.0–46.0)
Hemoglobin: 13 g/dL (ref 12.0–15.0)
MCH: 31.8 pg (ref 26.0–34.0)
MCHC: 33.6 g/dL (ref 30.0–36.0)
MCV: 94.6 fL (ref 78.0–100.0)
Platelets: 377 10*3/uL (ref 150–400)
RBC: 4.09 MIL/uL (ref 3.87–5.11)
RDW: 13.5 % (ref 11.5–15.5)
WBC: 18.3 10*3/uL — ABNORMAL HIGH (ref 4.0–10.5)

## 2017-08-28 LAB — BASIC METABOLIC PANEL
Anion gap: 12 (ref 5–15)
BUN: 25 mg/dL — AB (ref 6–20)
CO2: 26 mmol/L (ref 22–32)
Calcium: 10.2 mg/dL (ref 8.9–10.3)
Chloride: 94 mmol/L — ABNORMAL LOW (ref 101–111)
Creatinine, Ser: 0.6 mg/dL (ref 0.44–1.00)
GFR calc Af Amer: >60 mL/min (ref >60)
GFR calc non Af Amer: >60 mL/min (ref >60)
GLUCOSE: 266 mg/dL — AB (ref 65–99)
POTASSIUM: 4.2 mmol/L (ref 3.5–5.1)
Sodium: 132 mmol/L — ABNORMAL LOW (ref 135–145)

## 2017-08-28 MED ORDER — CYCLOSPORINE 0.05 % OP EMUL
1.0000 [drp] | Freq: Two times a day (BID) | OPHTHALMIC | Status: DC
Start: 2017-08-28 — End: 2017-08-31
  Administered 2017-08-28 – 2017-08-31 (×7): 1 [drp] via OPHTHALMIC
  Filled 2017-08-28 (×7): qty 1

## 2017-08-28 MED ORDER — ROPINIROLE HCL 1 MG PO TABS
1.0000 mg | ORAL_TABLET | Freq: Every day | ORAL | Status: DC
  Administered 2017-08-28 – 2017-08-30 (×3): 1 mg via ORAL
  Filled 2017-08-28 (×3): qty 1

## 2017-08-28 MED ORDER — INSULIN GLARGINE 100 UNIT/ML ~~LOC~~ SOLN
14.0000 [IU] | Freq: Every day | SUBCUTANEOUS | Status: DC
  Administered 2017-08-28 – 2017-08-29 (×2): 14 [IU] via SUBCUTANEOUS
  Filled 2017-08-28 (×3): qty 0.14

## 2017-08-28 MED ORDER — FUROSEMIDE 40 MG PO TABS
40.0000 mg | ORAL_TABLET | Freq: Every day | ORAL | Status: DC
  Administered 2017-08-28 – 2017-08-31 (×4): 40 mg via ORAL
  Filled 2017-08-28 (×4): qty 1

## 2017-08-28 NOTE — Progress Notes (Signed)
PROGRESS NOTE    [name]  VZD:[id number] DOB: [birth year] DOA: 08/25/2017 PCP: Jani Gravel, MD   Brief Narrative:  Patient is a 62 year old female history of COPD, hypertension, chronic pain presented to the ED with cough, dyspnea, wheezing, worsening shortness of breath at rest and on exertion.  Patient seen in the ED and noted to be tachypneic with sats in the low 90s.  Patient given a dose of IV Solu-Medrol, Levaquin, duo nebs and albuterol nebs x2.  Patient with ongoing dyspnea at rest and was admitted for management of COPD exacerbation.   Assessment & Plan:   Principal Problem:   COPD with acute exacerbation ([place]) Active Problems:   GERD without esophagitis   Hypertension   Anxiety   Chronic pain   Hyperglycemia  #1 acute COPD exacerbation Questionable etiology.  Patient states shortness of breath slowly improving since admission however not at baseline.  CT chest yesterday with extensive groundglass opacities with concern for respiratory bronchiolitis.  Patient complained of being anxious thinks may be secondary to steroids.  Slowly clinically improving.  Decreased IV steroids to 60 mg IV every 12 hours.  Continue Mucinex, Pulmicort, Brovana, Claritin, Protonix, Flonase, scheduled duo nebs, Levaquin.  Pulmonary following and appreciate input and recommendations.    2.  Gastroesophageal reflux without esophagitis Continue PPI.  3.  Hyperglycemia Patient with no prior history of diabetes mellitus.  Elevated CBGs likely secondary to IV steroids.  Hemoglobin A1c at 6.8.  CBGs ranging from 261-327.  Increase Lantus to 14 units daily.  Continue sliding scale insulin. Monitor CBGs closely and titrate insulin with steroid taper.    4.  Hypertension Blood pressure stable.  HCTZ has been discontinued due to worsening hyponatremia.  The patient has been started on home regimen of Lasix.  Continue lisinopril and titrate as needed for better blood pressure control.   5.   Anxiety Patient noted to be anxious.  Ativan has been changed to every 8 hours as needed.  Follow.    6.  Chronic pain Stable.  Continue home regimen of scheduled relafen and Ultracet as needed.  7.  Hyponatremia Sodium levels trending back up and currently at 132 from 128 with discontinuation of HCTZ.  Follow.   DVT prophylaxis: Lovenox  Code Status full Family Communication: Updated patient.  No family present. Disposition Plan: To be determined, pending PT evaluation.   Consultants:   Pulmonary: Dr. Luan Pulling 08/27/2017  Procedures:   Chest x-ray 08/25/2017  CT chest 08/27/2017  Antimicrobials:   IV Levaquin 08/25/2017>>>>   Subjective: Patient tearful and concerned about CT findings she stated Dr. Luan Pulling spoke to her about.  Patient still with shortness of breath and wheezing however improving clinically since admission.  No chest pain.   Objective: Vitals:   08/27/17 1938 08/27/17 2100 08/28/17 0500 08/28/17 0732  BP:  126/67 (!) 141/79   Pulse:  88 93   Resp:  20 20   Temp:  97.6 F (36.4 C) 97.8 F (36.6 C)   TempSrc:  Oral Oral   SpO2: 98% 96% 95% 98%  Weight:   105.4 kg (232 lb 5.8 oz)   Height:        Intake/Output Summary (Last 24 hours) at 08/28/2017 1055 Last data filed at 08/28/2017 0306 Gross per 24 hour  Intake 1880 ml  Output -  Net 1880 ml   Filed Weights   08/26/17 0500 08/27/17 0700 08/28/17 0500  Weight: 103 kg (227 lb 1.2 oz) 102.6 kg (226 lb  1.6 oz) 105.4 kg (232 lb 5.8 oz)    Examination:  General exam: Tearful.  Anxious. Respiratory system: Fair air movement.  Less tight.  Expiratory wheezing improving.  No crackles.  Cardiovascular system: Regular rate and rhythm no murmurs rubs or gallops.  No JVD.  Trace to 1+ bilateral lower extremity edema. Gastrointestinal system: Abdomen is nondistended, nontender, soft, positive bowel sounds.  No hepatosplenomegaly.  No rebound.  No guarding.   Central nervous system: Alert and oriented. No  focal neurological deficits. Extremities: Symmetric 5 x 5 power. Skin: No rashes, lesions or ulcers Psychiatry: Judgement and insight appear normal. Mood & affect appropriate.     Data Reviewed: I have personally reviewed following labs and imaging studies  CBC: Recent Labs  Lab 08/25/17 1959 08/26/17 0511 08/27/17 0603 08/28/17 0557  WBC 14.5* 11.9* 20.7* 18.3*  NEUTROABS 10.8* 10.7*  --   --   HGB 13.2 12.3 12.4 13.0  HCT 39.0 36.8 37.6 38.7  MCV 94.0 95.1 94.7 94.6  PLT 295 272 331 409   Basic Metabolic Panel: Recent Labs  Lab 08/25/17 1959 08/26/17 0511 08/27/17 0603 08/28/17 0557  NA 132* 133* 128* 132*  K 4.2 4.5 4.2 4.2  CL 95* 97* 91* 94*  CO2 26 23 27 26   GLUCOSE 189* 369* 314* 266*  BUN 21* 23* 28* 25*  CREATININE 0.74 0.84 0.70 0.60  CALCIUM 10.5* 10.0 9.9 10.2   GFR: Estimated Creatinine Clearance: 87.9 mL/min (by C-G formula based on SCr of 0.6 mg/dL). Liver Function Tests: Recent Labs  Lab 08/25/17 1959  AST 20  ALT 19  ALKPHOS 75  BILITOT 0.2*  PROT 7.0  ALBUMIN 3.1*   No results for input(s): LIPASE, AMYLASE in the last 168 hours. No results for input(s): AMMONIA in the last 168 hours. Coagulation Profile: No results for input(s): INR, PROTIME in the last 168 hours. Cardiac Enzymes: Recent Labs  Lab 08/25/17 1959  TROPONINI <0.03   BNP (last 3 results) No results for input(s): PROBNP in the last 8760 hours. HbA1C: Recent Labs    08/26/17 0859  HGBA1C 6.8*   CBG: Recent Labs  Lab 08/27/17 1136 08/27/17 1138 08/27/17 1614 08/27/17 2142 08/28/17 0724  GLUCAP 297* 352* 328* 313* 261*   Lipid Profile: No results for input(s): CHOL, HDL, LDLCALC, TRIG, CHOLHDL, LDLDIRECT in the last 72 hours. Thyroid Function Tests: No results for input(s): TSH, T4TOTAL, FREET4, T3FREE, THYROIDAB in the last 72 hours. Anemia Panel: No results for input(s): VITAMINB12, FOLATE, FERRITIN, TIBC, IRON, RETICCTPCT in the last 72 hours. Sepsis  Labs: Recent Labs  Lab 08/25/17 1959 08/26/17 0511 08/27/17 0603  PROCALCITON 0.11 <0.10 <0.10    No results found for this or any previous visit (from the past 240 hour(s)).       Radiology Studies: Ct Chest W Contrast  Result Date: 08/27/2017 CLINICAL DATA:  Inpatient. Dyspnea. Hypotension. Current smoker. COPD. EXAM: CT CHEST WITH CONTRAST TECHNIQUE: Multidetector CT imaging of the chest was performed during intravenous contrast administration. CONTRAST:  34mL ISOVUE-300 IOPAMIDOL (ISOVUE-300) INJECTION 61% COMPARISON:  08/25/2017 chest radiograph.  10/12/2013 chest CT. FINDINGS: Cardiovascular: Normal heart size. No significant pericardial fluid/thickening. Three-vessel coronary atherosclerosis. Atherosclerotic nonaneurysmal thoracic aorta. Normal caliber pulmonary arteries. No central pulmonary emboli. Mediastinum/Nodes: No discrete thyroid nodules. Unremarkable esophagus. No pathologically enlarged axillary, mediastinal or hilar lymph nodes. Lungs/Pleura: No pneumothorax. No pleural effusion. No acute consolidative airspace disease, lung masses or significant pulmonary nodules. There is prominent patchy ground-glass opacity throughout both lungs,  with relative sparing at the lung bases. No significant regions traction bronchiectasis, architectural distortion or frank honeycombing. Upper abdomen: No acute abnormality. Musculoskeletal: No aggressive appearing focal osseous lesions. Mild thoracic spondylosis. IMPRESSION: 1. Prominent nonspecific patchy ground-glass opacity throughout both lungs with relative sparing at the lung bases. Smoking related interstitial lung disease (respiratory bronchiolitis-interstitial lung disease (RB-ILD)) is a consideration in this current smoker (per EPIC records). Other considerations include acute interstitial pneumonia (AIP), diffuse alveolar hemorrhage, drug toxicity or atypical infection (particularly if the patient is immunocompromised). 2. Three-vessel  coronary atherosclerosis. Aortic Atherosclerosis (ICD10-I70.0). Electronically Signed   By: Ilona Sorrel M.D.   On: 08/27/2017 11:45        Scheduled Meds: . arformoterol  15 mcg Nebulization BID  . baclofen  10 mg Oral TID  . budesonide (PULMICORT) nebulizer solution  0.5 mg Nebulization BID  . cycloSPORINE  1 drop Both Eyes BID  . enoxaparin (LOVENOX) injection  40 mg Subcutaneous Q24H  . furosemide  40 mg Oral Daily  . guaiFENesin  1,200 mg Oral BID  . insulin aspart  0-20 Units Subcutaneous TID WC  . insulin aspart  0-5 Units Subcutaneous QHS  . insulin glargine  14 Units Subcutaneous Daily  . ipratropium-albuterol  3 mL Inhalation TID  . lisinopril  20 mg Oral Daily  . loratadine  10 mg Oral Daily  . mouth rinse  15 mL Mouth Rinse BID  . methylPREDNISolone (SOLU-MEDROL) injection  60 mg Intravenous Q12H  . nabumetone  500 mg Oral BID  . nicotine  21 mg Transdermal Daily  . pantoprazole  40 mg Oral Q0600  . pravastatin  40 mg Oral q1800  . rOPINIRole  1 mg Oral QHS  . sodium chloride flush  3 mL Intravenous Q12H   Continuous Infusions: . sodium chloride    . levofloxacin (LEVAQUIN) IV Stopped (08/27/17 2345)     LOS: 3 days    Time spent: 35 mins    Irine Seal, MD [facility] Pager [phone]  If 7PM-7AM, please contact night-coverage [email or web address] Password [facility] 08/28/2017, 10:55 AM

## 2017-08-28 NOTE — Progress Notes (Signed)
SATURATION QUALIFICATIONS: (This note is used to comply with regulatory documentation for home oxygen)  Patient Saturations on Room Air at Rest = 92%  Patient Saturations on Room Air while Ambulating = 87%:

## 2017-08-28 NOTE — Progress Notes (Signed)
Subjective: She says she feels some better.  She is still coughing and congested.  Echocardiogram is underway.  Her CT yesterday showed extensive groundglass opacities I think most consistent with respiratory bronchiolitis but it does have a very wide differential.  Objective: Vital signs in last 24 hours: Temp:  [97.6 F (36.4 C)-97.8 F (36.6 C)] 97.8 F (36.6 C) (02/24 0500) Pulse Rate:  [88-93] 93 (02/24 0500) Resp:  [20] 20 (02/24 0500) BP: (118-141)/(57-79) 141/79 (02/24 0500) SpO2:  [95 %-98 %] 98 % (02/24 0732) Weight:  [105.4 kg (232 lb 5.8 oz)] 105.4 kg (232 lb 5.8 oz) (02/24 0500) Weight change: 2.842 kg (6 lb 4.2 oz) Last BM Date: 08/24/17  Intake/Output from previous day: 02/23 0701 - 02/24 0700 In: 2000 [P.O.:360; I.V.:1490; IV Piggyback:150] Out: -   PHYSICAL EXAM General appearance: alert and cooperative Resp: She has rhonchi and wheezing but she is much clearer than yesterday Cardio: regular rate and rhythm, S1, S2 normal, no murmur, click, rub or gallop GI: soft, non-tender; bowel sounds normal; no masses,  no organomegaly Extremities: extremities normal, atraumatic, no cyanosis or edema  Lab Results:  Results for orders placed or performed during the hospital encounter of 08/25/17 (from the past 48 hour(s))  Glucose, capillary     Status: Abnormal   Collection Time: 08/26/17 11:39 AM  Result Value Ref Range   Glucose-Capillary 321 (H) 65 - 99 mg/dL   Comment 1 Notify RN    Comment 2 Document in Chart   Glucose, capillary     Status: Abnormal   Collection Time: 08/26/17  4:30 PM  Result Value Ref Range   Glucose-Capillary 197 (H) 65 - 99 mg/dL   Comment 1 Notify RN    Comment 2 Document in Chart   Glucose, capillary     Status: Abnormal   Collection Time: 08/26/17  8:33 PM  Result Value Ref Range   Glucose-Capillary 315 (H) 65 - 99 mg/dL   Comment 1 Notify RN    Comment 2 Document in Chart   Procalcitonin     Status: None   Collection Time:  08/27/17  6:03 AM  Result Value Ref Range   Procalcitonin <0.10 ng/mL    Comment:        Interpretation: PCT (Procalcitonin) <= 0.5 ng/mL: Systemic infection (sepsis) is not likely. Local bacterial infection is possible. (NOTE)       Sepsis PCT Algorithm           Lower Respiratory Tract                                      Infection PCT Algorithm    ----------------------------     ----------------------------         PCT < 0.25 ng/mL                PCT < 0.10 ng/mL         Strongly encourage             Strongly discourage   discontinuation of antibiotics    initiation of antibiotics    ----------------------------     -----------------------------       PCT 0.25 - 0.50 ng/mL            PCT 0.10 - 0.25 ng/mL               OR       >  80% decrease in PCT            Discourage initiation of                                            antibiotics      Encourage discontinuation           of antibiotics    ----------------------------     -----------------------------         PCT >= 0.50 ng/mL              PCT 0.26 - 0.50 ng/mL               AND        <80% decrease in PCT             Encourage initiation of                                             antibiotics       Encourage continuation           of antibiotics    ----------------------------     -----------------------------        PCT >= 0.50 ng/mL                  PCT > 0.50 ng/mL               AND         increase in PCT                  Strongly encourage                                      initiation of antibiotics    Strongly encourage escalation           of antibiotics                                     -----------------------------                                           PCT <= 0.25 ng/mL                                                 OR                                        > 80% decrease in PCT                                     Discontinue / Do not initiate  antibiotics Performed at [facility], [address], [place], [place] [address]   Basic metabolic panel     Status: Abnormal   Collection Time: 08/27/17  6:03 AM  Result Value Ref Range   Sodium 128 (L) 135 - 145 mmol/L   Potassium 4.2 3.5 - 5.1 mmol/L   Chloride 91 (L) 101 - 111 mmol/L   CO2 27 22 - 32 mmol/L   Glucose, Bld 314 (H) 65 - 99 mg/dL   BUN 28 (H) 6 - 20 mg/dL   Creatinine, Ser 0.70 0.44 - 1.00 mg/dL   Calcium 9.9 8.9 - 10.3 mg/dL   GFR calc non Af Amer >60 >60 mL/min   GFR calc Af Amer >60 >60 mL/min    Comment: (NOTE) The eGFR has been calculated using the CKD EPI equation. This calculation has not been validated in all clinical situations. eGFR's persistently <60 mL/min signify possible Chronic Kidney Disease.    Anion gap 10 5 - 15    Comment: Performed at [facility], [address] [place], [place] [address]  CBC     Status: Abnormal   Collection Time: 08/27/17  6:03 AM  Result Value Ref Range   WBC 20.7 (H) 4.0 - 10.5 K/uL   RBC 3.97 3.87 - 5.11 MIL/uL   Hemoglobin 12.4 12.0 - 15.0 g/dL   HCT 37.6 36.0 - 46.0 %   MCV 94.7 78.0 - 100.0 fL   MCH 31.2 26.0 - 34.0 pg   MCHC 33.0 30.0 - 36.0 g/dL   RDW 13.5 11.5 - 15.5 %   Platelets 331 150 - 400 K/uL    Comment: Performed at [facility], [address], [place], [place] [address]  Glucose, capillary     Status: Abnormal   Collection Time: 08/27/17 11:36 AM  Result Value Ref Range   Glucose-Capillary 297 (H) 65 - 99 mg/dL  Glucose, capillary     Status: Abnormal   Collection Time: 08/27/17 11:38 AM  Result Value Ref Range   Glucose-Capillary 352 (H) 65 - 99 mg/dL   Comment 1 Notify RN   Glucose, capillary     Status: Abnormal   Collection Time: 08/27/17  4:14 PM  Result Value Ref Range   Glucose-Capillary 328 (H) 65 - 99 mg/dL   Comment 1 Notify RN    Comment 2 Document in Chart   Glucose, capillary     Status: Abnormal   Collection Time: 08/27/17  9:42 PM  Result Value Ref Range    Glucose-Capillary 313 (H) 65 - 99 mg/dL   Comment 1 Notify RN    Comment 2 Document in Chart   CBC     Status: Abnormal   Collection Time: 08/28/17  5:57 AM  Result Value Ref Range   WBC 18.3 (H) 4.0 - 10.5 K/uL   RBC 4.09 3.87 - 5.11 MIL/uL   Hemoglobin 13.0 12.0 - 15.0 g/dL   HCT 38.7 36.0 - 46.0 %   MCV 94.6 78.0 - 100.0 fL   MCH 31.8 26.0 - 34.0 pg   MCHC 33.6 30.0 - 36.0 g/dL   RDW 13.5 11.5 - 15.5 %   Platelets 377 150 - 400 K/uL    Comment: Performed at [facility], [address] [place], [place] [address]  Basic metabolic panel     Status: Abnormal   Collection Time: 08/28/17  5:57 AM  Result Value Ref Range   Sodium 132 (L) 135 - 145 mmol/L   Potassium 4.2 3.5 - 5.1  mmol/L   Chloride 94 (L) 101 - 111 mmol/L   CO2 26 22 - 32 mmol/L   Glucose, Bld 266 (H) 65 - 99 mg/dL   BUN 25 (H) 6 - 20 mg/dL   Creatinine, Ser 0.60 0.44 - 1.00 mg/dL   Calcium 10.2 8.9 - 10.3 mg/dL   GFR calc non Af Amer >60 >60 mL/min   GFR calc Af Amer >60 >60 mL/min    Comment: (NOTE) The eGFR has been calculated using the CKD EPI equation. This calculation has not been validated in all clinical situations. eGFR's persistently <60 mL/min signify possible Chronic Kidney Disease.    Anion gap 12 5 - 15    Comment: Performed at [facility], [address] [place], [place] [address]  Glucose, capillary     Status: Abnormal   Collection Time: 08/28/17  7:24 AM  Result Value Ref Range   Glucose-Capillary 261 (H) 65 - 99 mg/dL   Comment 1 Notify RN     ABGS No results for input(s): PHART, PO2ART, TCO2, HCO3 in the last 72 hours.  Invalid input(s): PCO2 CULTURES No results found for this or any previous visit (from the past 240 hour(s)). Studies/Results: Ct Chest W Contrast  Result Date: 08/27/2017 CLINICAL DATA:  Inpatient. Dyspnea. Hypotension. Current smoker. COPD. EXAM: CT CHEST WITH CONTRAST TECHNIQUE: Multidetector CT imaging of the chest was performed during intravenous contrast  administration. CONTRAST:  75mL ISOVUE-300 IOPAMIDOL (ISOVUE-300) INJECTION 61% COMPARISON:  08/25/2017 chest radiograph.  10/12/2013 chest CT. FINDINGS: Cardiovascular: Normal heart size. No significant pericardial fluid/thickening. Three-vessel coronary atherosclerosis. Atherosclerotic nonaneurysmal thoracic aorta. Normal caliber pulmonary arteries. No central pulmonary emboli. Mediastinum/Nodes: No discrete thyroid nodules. Unremarkable esophagus. No pathologically enlarged axillary, mediastinal or hilar lymph nodes. Lungs/Pleura: No pneumothorax. No pleural effusion. No acute consolidative airspace disease, lung masses or significant pulmonary nodules. There is prominent patchy ground-glass opacity throughout both lungs, with relative sparing at the lung bases. No significant regions traction bronchiectasis, architectural distortion or frank honeycombing. Upper abdomen: No acute abnormality. Musculoskeletal: No aggressive appearing focal osseous lesions. Mild thoracic spondylosis. IMPRESSION: 1. Prominent nonspecific patchy ground-glass opacity throughout both lungs with relative sparing at the lung bases. Smoking related interstitial lung disease (respiratory bronchiolitis-interstitial lung disease (RB-ILD)) is a consideration in this current smoker (per EPIC records). Other considerations include acute interstitial pneumonia (AIP), diffuse alveolar hemorrhage, drug toxicity or atypical infection (particularly if the patient is immunocompromised). 2. Three-vessel coronary atherosclerosis. Aortic Atherosclerosis (ICD10-I70.0). Electronically Signed   By: Jason A Poff M.D.   On: 08/27/2017 11:45    Medications:  Prior to Admission:  Medications Prior to Admission  Medication Sig Dispense Refill Last Dose  . albuterol (PROVENTIL HFA;VENTOLIN HFA) 108 (90 Base) MCG/ACT inhaler Inhale 2 puffs into the lungs every 6 (six) hours as needed for wheezing or shortness of breath.   08/25/2017 at Unknown time  .  albuterol (PROVENTIL) (2.5 MG/3ML) 0.083% nebulizer solution Take 2.5 mg by nebulization every 6 (six) hours as needed for wheezing or shortness of breath.   08/25/2017 at Unknown time  . albuterol-ipratropium (COMBIVENT) 18-103 MCG/ACT inhaler Inhale into the lungs every 4 (four) hours.   08/25/2017 at Unknown time  . baclofen (LIORESAL) 10 MG tablet Take 10 mg by mouth 3 (three) times daily.   08/25/2017 at Unknown time  . conjugated estrogens (PREMARIN) vaginal cream Place 1 Applicatorful vaginally daily. 1 gram daily 42.5 g 12 08/25/2017 at Unknown time  . cycloSPORINE (RESTASIS) 0.05 % ophthalmic emulsion Place 1 drop   into both eyes 2 (two) times daily.     . fluticasone (FLONASE) 50 MCG/ACT nasal spray Place 1 spray into both nostrils 2 (two) times daily as needed for allergies or rhinitis. 16 g 6 08/25/2017 at Unknown time  . hydrochlorothiazide (HYDRODIURIL) 25 MG tablet Take 25 mg by mouth daily.   08/25/2017 at Unknown time  . lisinopril (PRINIVIL,ZESTRIL) 20 MG tablet TAKE ONE TABLET BY MOUTH DAILY. 30 tablet 0 08/25/2017 at Unknown time  . lovastatin (MEVACOR) 40 MG tablet Take 40 mg by mouth at bedtime.   Past Week at Unknown time  . nabumetone (RELAFEN) 500 MG tablet Take 1 tablet (500 mg total) by mouth 2 (two) times daily. 60 tablet 5 08/25/2017 at Unknown time  . nitroGLYCERIN (NITROSTAT) 0.4 MG SL tablet DISSOLVE 1 TABLET UNDER THE TONGUE EVERY 5 MINUTES IF NEEDED FOR CHEST PAIN. MAX 3 DOSES THEN CALL 911. 25 tablet 3 Taking  . rOPINIRole (REQUIP) 1 MG tablet Take 1 tablet by mouth at bedtime.      . traMADol-acetaminophen (ULTRACET) 37.5-325 MG tablet Take 1 tablet by mouth every 6 (six) hours as needed. 28 tablet 0 08/25/2017 at Unknown time  . furosemide (LASIX) 40 MG tablet Take 1 tablet (40 mg total) by mouth daily. (Patient not taking: Reported on 08/26/2017) 30 tablet 3 Not Taking at Unknown time  . lidocaine (XYLOCAINE) 2 % solution TAKE 15 MLS BY MOUTH EVERY 6 HOURS AS NEEDED. (Patient  not taking: Reported on 08/26/2017) 200 mL 0 Not Taking at Unknown time  . promethazine (PHENERGAN) 25 MG tablet TAKE (1) TABLET BY MOUTH EVERY SIX HOURS AS NEEDED. (Patient not taking: Reported on 07/12/2017) 30 tablet 0 Not Taking at Unknown time   Scheduled: . arformoterol  15 mcg Nebulization BID  . baclofen  10 mg Oral TID  . budesonide (PULMICORT) nebulizer solution  0.5 mg Nebulization BID  . cycloSPORINE  1 drop Both Eyes BID  . enoxaparin (LOVENOX) injection  40 mg Subcutaneous Q24H  . furosemide  40 mg Oral Daily  . guaiFENesin  1,200 mg Oral BID  . insulin aspart  0-20 Units Subcutaneous TID WC  . insulin aspart  0-5 Units Subcutaneous QHS  . insulin glargine  14 Units Subcutaneous Daily  . ipratropium-albuterol  3 mL Inhalation TID  . lisinopril  20 mg Oral Daily  . loratadine  10 mg Oral Daily  . mouth rinse  15 mL Mouth Rinse BID  . methylPREDNISolone (SOLU-MEDROL) injection  60 mg Intravenous Q12H  . nabumetone  500 mg Oral BID  . nicotine  21 mg Transdermal Daily  . pantoprazole  40 mg Oral Q0600  . pravastatin  40 mg Oral q1800  . rOPINIRole  1 mg Oral QHS  . sodium chloride flush  3 mL Intravenous Q12H   Continuous: . sodium chloride    . levofloxacin (LEVAQUIN) IV Stopped (08/27/17 2345)   CZY:SAYTKZ chloride, acetaminophen **OR** acetaminophen, albuterol, bisacodyl, fluticasone, gi cocktail, HYDROcodone-homatropine, LORazepam, ondansetron **OR** ondansetron (ZOFRAN) IV, senna-docusate, sodium chloride flush, traMADol-acetaminophen  Assesment: She has COPD with exacerbation.  She has abnormal chest CT which I have personally reviewed.  If she does have respiratory bronchiolitis she is on appropriate treatment.  If she has other inflammatory disease of the lung she is on appropriate treatment.  She is being investigated for heart failure which she has had in the past. Principal Problem:   COPD with acute exacerbation ([place]) Active Problems:   GERD without  esophagitis  Hypertension   Anxiety   Chronic pain   Hyperglycemia    Plan: Continue current medications    LOS: 3 days   , L 08/28/2017, 10:45 AM  

## 2017-08-28 NOTE — Progress Notes (Signed)
  Echocardiogram 2D Echocardiogram has been performed.  Darlina Sicilian M 08/28/2017, 10:29 AM

## 2017-08-29 LAB — CBC
HEMATOCRIT: 37.3 % (ref 36.0–46.0)
Hemoglobin: 12.7 g/dL (ref 12.0–15.0)
MCH: 32.1 pg (ref 26.0–34.0)
MCHC: 34 g/dL (ref 30.0–36.0)
MCV: 94.2 fL (ref 78.0–100.0)
Platelets: 365 10*3/uL (ref 150–400)
RBC: 3.96 MIL/uL (ref 3.87–5.11)
RDW: 13.6 % (ref 11.5–15.5)
WBC: 14.8 10*3/uL — AB (ref 4.0–10.5)

## 2017-08-29 LAB — BASIC METABOLIC PANEL
ANION GAP: 10 (ref 5–15)
BUN: 23 mg/dL — ABNORMAL HIGH (ref 6–20)
CO2: 27 mmol/L (ref 22–32)
Calcium: 10.2 mg/dL (ref 8.9–10.3)
Chloride: 93 mmol/L — ABNORMAL LOW (ref 101–111)
Creatinine, Ser: 0.71 mg/dL (ref 0.44–1.00)
Glucose, Bld: 346 mg/dL — ABNORMAL HIGH (ref 65–99)
POTASSIUM: 4.4 mmol/L (ref 3.5–5.1)
Sodium: 130 mmol/L — ABNORMAL LOW (ref 135–145)

## 2017-08-29 LAB — GLUCOSE, CAPILLARY
GLUCOSE-CAPILLARY: 184 mg/dL — AB (ref 65–99)
Glucose-Capillary: 307 mg/dL — ABNORMAL HIGH (ref 65–99)
Glucose-Capillary: 326 mg/dL — ABNORMAL HIGH (ref 65–99)
Glucose-Capillary: 290 mg/dL — ABNORMAL HIGH (ref 65–99)
Glucose-Capillary: 127 mg/dL — ABNORMAL HIGH (ref 65–99)

## 2017-08-29 MED ORDER — IPRATROPIUM-ALBUTEROL 0.5-2.5 (3) MG/3ML IN SOLN: 3.0000 mL | RESPIRATORY_TRACT | Status: DC | PRN

## 2017-08-29 MED ORDER — LEVOFLOXACIN 750 MG PO TABS
750.0000 mg | ORAL_TABLET | Freq: Every day | ORAL | Status: DC
  Administered 2017-08-29 – 2017-08-31 (×3): 750 mg via ORAL
  Filled 2017-08-29 (×3): qty 1

## 2017-08-29 MED ORDER — INSULIN GLARGINE 100 UNIT/ML ~~LOC~~ SOLN
4.0000 [IU] | Freq: Once | SUBCUTANEOUS | Status: AC
  Administered 2017-08-29: 4 [IU] via SUBCUTANEOUS
  Filled 2017-08-29: qty 0.04

## 2017-08-29 MED ORDER — INSULIN ASPART 100 UNIT/ML ~~LOC~~ SOLN
7.0000 [IU] | Freq: Three times a day (TID) | SUBCUTANEOUS | Status: DC
  Administered 2017-08-29 – 2017-08-31 (×6): 7 [IU] via SUBCUTANEOUS

## 2017-08-29 MED ORDER — INSULIN GLARGINE 100 UNIT/ML ~~LOC~~ SOLN
18.0000 [IU] | Freq: Every day | SUBCUTANEOUS | Status: DC
  Administered 2017-08-30: 18 [IU] via SUBCUTANEOUS
  Filled 2017-08-29 (×2): qty 0.18

## 2017-08-29 NOTE — Progress Notes (Signed)
PROGRESS NOTE    AMBRIELLA Peterson  TDV:[id number] DOB: September 11, 1955 DOA: 08/25/2017 PCP: Jani Gravel, MD   Brief Narrative:  Patient is a 62 year old female history of COPD, hypertension, chronic pain presented to the ED with cough, dyspnea, wheezing, worsening shortness of breath at rest and on exertion.  Patient seen in the ED and noted to be tachypneic with sats in the low 90s.  Patient given a dose of IV Solu-Medrol, Levaquin, duo nebs and albuterol nebs x2.  Patient with ongoing dyspnea at rest and was admitted for management of COPD exacerbation.   Assessment & Plan:   Principal Problem:   COPD with acute exacerbation ([place]) Active Problems:   GERD without esophagitis   Hypertension   Anxiety   Chronic pain   Hyperglycemia  #1 acute COPD exacerbation Questionable etiology.  Patient states shortness of breath slowly improving since admission however not at baseline.  CT chest with extensive groundglass opacities with concern for respiratory bronchiolitis.  Patient complained of being anxious thinks may be secondary to steroids. Continue IV steroids to 60 mg IV every 12 hours likely transition to oral prednisone in the next 24 hours..  Continue Mucinex, Pulmicort, Brovana, Claritin, Protonix, Flonase, scheduled duo nebs, Levaquin.  Pulmonary following and appreciate input and recommendations.    2.  Gastroesophageal reflux without esophagitis PPI.  3.  Hyperglycemia Patient with no prior history of diabetes mellitus.  Elevated CBGs likely secondary to IV steroids.  Hemoglobin A1c at 6.8.  CBGs ranging from 279-326.  Increase Lantus to 18 units daily. Start Novolog 7 units with meals.  Continue sliding scale insulin. Monitor CBGs closely and titrate insulin with steroid taper.    4.  Hypertension Blood pressure stable.  HCTZ has been discontinued due to worsening hyponatremia.  The patient has been started on home regimen of Lasix.  Continue lisinopril and titrate as needed for  better blood pressure control.   5.  Anxiety Patient noted to be anxious over the past few days and subsequently placed on Ativan as needed every 8 hours.  Follow.  6.  Chronic pain Stable.  Continue home regimen of scheduled relafen and Ultracet as needed.  7.  Hyponatremia Sodium levels trending back up and fluctuating with discontinuation of HCTZ.  Follow.   DVT prophylaxis: Lovenox  Code Status full Family Communication: Updated patient.  No family present. Disposition Plan: To be determined, pending PT evaluation.   Consultants:   Pulmonary: Dr. Luan Pulling 08/27/2017  Procedures:   Chest x-ray 08/25/2017  CT chest 08/27/2017  Antimicrobials:   IV Levaquin 08/25/2017>>>> oral Levaquin 08/29/2017   Subjective: Patient sitting up on side of bed states she is feeling better.  Patient still with complaints of shortness of breath however has improved since admission per patient.    Objective: Vitals:   08/28/17 2021 08/29/17 0500 08/29/17 0741 08/29/17 0743  BP: 130/80 135/68    Pulse: 94 81    Resp: 20 20    Temp: 98.5 F (36.9 C) 97.8 F (36.6 C)    TempSrc: Oral Oral    SpO2: 93% 93% 97% 97%  Weight:  104.6 kg (230 lb 9.6 oz)    Height:        Intake/Output Summary (Last 24 hours) at 08/29/2017 1119 Last data filed at 08/29/2017 0900 Gross per 24 hour  Intake 600 ml  Output 550 ml  Net 50 ml   Filed Weights   08/27/17 0700 08/28/17 0500 08/29/17 0500  Weight: 102.6 kg (226 lb  1.6 oz) 105.4 kg (232 lb 5.8 oz) 104.6 kg (230 lb 9.6 oz)    Examination:  General exam: NAD Respiratory system: Fair air movement.  Less tight.  Minimal Expiratory wheezing.  No crackles.  Cardiovascular system: RRR. No JVD.  Trace to 1+ bilateral lower extremity edema. Gastrointestinal system: Abdomen is soft, nontender, nondistended, positive bowel sounds.  No rebound.  No guarding.   Central nervous system: Alert and oriented. No focal neurological deficits. Extremities:  Symmetric 5 x 5 power. Skin: No rashes, lesions or ulcers Psychiatry: Judgement and insight appear normal. Mood & affect appropriate.     Data Reviewed: I have personally reviewed following labs and imaging studies  CBC: Recent Labs  Lab 08/25/17 1959 08/26/17 0511 08/27/17 0603 08/28/17 0557 08/29/17 0614  WBC 14.5* 11.9* 20.7* 18.3* 14.8*  NEUTROABS 10.8* 10.7*  --   --   --   HGB 13.2 12.3 12.4 13.0 12.7  HCT 39.0 36.8 37.6 38.7 37.3  MCV 94.0 95.1 94.7 94.6 94.2  PLT 295 272 331 377 993   Basic Metabolic Panel: Recent Labs  Lab 08/25/17 1959 08/26/17 0511 08/27/17 0603 08/28/17 0557 08/29/17 0614  NA 132* 133* 128* 132* 130*  K 4.2 4.5 4.2 4.2 4.4  CL 95* 97* 91* 94* 93*  CO2 26 23 27 26 27   GLUCOSE 189* 369* 314* 266* 346*  BUN 21* 23* 28* 25* 23*  CREATININE 0.74 0.84 0.70 0.60 0.71  CALCIUM 10.5* 10.0 9.9 10.2 10.2   GFR: Estimated Creatinine Clearance: 87.5 mL/min (by C-G formula based on SCr of 0.71 mg/dL). Liver Function Tests: Recent Labs  Lab 08/25/17 1959  AST 20  ALT 19  ALKPHOS 75  BILITOT 0.2*  PROT 7.0  ALBUMIN 3.1*   No results for input(s): LIPASE, AMYLASE in the last 168 hours. No results for input(s): AMMONIA in the last 168 hours. Coagulation Profile: No results for input(s): INR, PROTIME in the last 168 hours. Cardiac Enzymes: Recent Labs  Lab 08/25/17 1959  TROPONINI <0.03   BNP (last 3 results) No results for input(s): PROBNP in the last 8760 hours. HbA1C: No results for input(s): HGBA1C in the last 72 hours. CBG: Recent Labs  Lab 08/28/17 0724 08/28/17 1201 08/28/17 1726 08/28/17 2031 08/29/17 0759  GLUCAP 261* 327* 263* 279* 326*   Lipid Profile: No results for input(s): CHOL, HDL, LDLCALC, TRIG, CHOLHDL, LDLDIRECT in the last 72 hours. Thyroid Function Tests: No results for input(s): TSH, T4TOTAL, FREET4, T3FREE, THYROIDAB in the last 72 hours. Anemia Panel: No results for input(s): VITAMINB12, FOLATE,  FERRITIN, TIBC, IRON, RETICCTPCT in the last 72 hours. Sepsis Labs: Recent Labs  Lab 08/25/17 1959 08/26/17 0511 08/27/17 0603  PROCALCITON 0.11 <0.10 <0.10    No results found for this or any previous visit (from the past 240 hour(s)).       Radiology Studies: Ct Chest W Contrast  Result Date: 08/27/2017 CLINICAL DATA:  Inpatient. Dyspnea. Hypotension. Current smoker. COPD. EXAM: CT CHEST WITH CONTRAST TECHNIQUE: Multidetector CT imaging of the chest was performed during intravenous contrast administration. CONTRAST:  49mL ISOVUE-300 IOPAMIDOL (ISOVUE-300) INJECTION 61% COMPARISON:  08/25/2017 chest radiograph.  10/12/2013 chest CT. FINDINGS: Cardiovascular: Normal heart size. No significant pericardial fluid/thickening. Three-vessel coronary atherosclerosis. Atherosclerotic nonaneurysmal thoracic aorta. Normal caliber pulmonary arteries. No central pulmonary emboli. Mediastinum/Nodes: No discrete thyroid nodules. Unremarkable esophagus. No pathologically enlarged axillary, mediastinal or hilar lymph nodes. Lungs/Pleura: No pneumothorax. No pleural effusion. No acute consolidative airspace disease, lung masses or significant pulmonary  nodules. There is prominent patchy ground-glass opacity throughout both lungs, with relative sparing at the lung bases. No significant regions traction bronchiectasis, architectural distortion or frank honeycombing. Upper abdomen: No acute abnormality. Musculoskeletal: No aggressive appearing focal osseous lesions. Mild thoracic spondylosis. IMPRESSION: 1. Prominent nonspecific patchy ground-glass opacity throughout both lungs with relative sparing at the lung bases. Smoking related interstitial lung disease (respiratory bronchiolitis-interstitial lung disease (RB-ILD)) is a consideration in this current smoker (per EPIC records). Other considerations include acute interstitial pneumonia (AIP), diffuse alveolar hemorrhage, drug toxicity or atypical infection  (particularly if the patient is immunocompromised). 2. Three-vessel coronary atherosclerosis. Aortic Atherosclerosis (ICD10-I70.0). Electronically Signed   By: Ilona Sorrel M.D.   On: 08/27/2017 11:45        Scheduled Meds: . arformoterol  15 mcg Nebulization BID  . baclofen  10 mg Oral TID  . budesonide (PULMICORT) nebulizer solution  0.5 mg Nebulization BID  . cycloSPORINE  1 drop Both Eyes BID  . enoxaparin (LOVENOX) injection  40 mg Subcutaneous Q24H  . furosemide  40 mg Oral Daily  . guaiFENesin  1,200 mg Oral BID  . insulin aspart  0-20 Units Subcutaneous TID WC  . insulin aspart  0-5 Units Subcutaneous QHS  . insulin aspart  7 Units Subcutaneous TID WC  . [START ON 08/30/2017] insulin glargine  18 Units Subcutaneous Daily  . levofloxacin  750 mg Oral Daily  . lisinopril  20 mg Oral Daily  . loratadine  10 mg Oral Daily  . mouth rinse  15 mL Mouth Rinse BID  . methylPREDNISolone (SOLU-MEDROL) injection  60 mg Intravenous Q12H  . nabumetone  500 mg Oral BID  . nicotine  21 mg Transdermal Daily  . pantoprazole  40 mg Oral Q0600  . pravastatin  40 mg Oral q1800  . rOPINIRole  1 mg Oral QHS  . sodium chloride flush  3 mL Intravenous Q12H   Continuous Infusions: . sodium chloride       LOS: 4 days    Time spent: 35 mins    Irine Seal, MD [facility] Pager [phone]  If 7PM-7AM, please contact night-coverage [email or web address] Password [facility] 08/29/2017, 11:19 AM

## 2017-08-29 NOTE — Progress Notes (Addendum)
Inpatient Diabetes Program Recommendations  AACE/ADA: New Consensus Statement on Inpatient Glycemic Control (2015)  Target Ranges:  Prepandial:   less than 140 mg/dL      Peak postprandial:   less than 180 mg/dL (1-2 hours)      Critically ill patients:  140 - 180 mg/dL   Lab Results  Component Value Date   GLUCAP 326 (H) 08/29/2017   HGBA1C 6.8 (H) 08/26/2017    Review of Glycemic Control Results for ICA, DAYE (MRN [id number]) as of 08/29/2017 09:53  Ref. Range 08/28/2017 12:01 08/28/2017 17:26 08/28/2017 20:31 08/29/2017 07:59  Glucose-Capillary Latest Ref Range: 65 - 99 mg/dL 327 (H) 263 (H) 279 (H) 326 (H)    Diabetes history: NONE Outpatient Diabetes medications: NA Current orders: Novolog Resistant Correction Scale/ SSI (0-20 units) TID AC + HS                           Lantus 14 units daily  Inpatient Diabetes Program Recommendations:     Patient receiving Solumedrol 60 mg Q12H causing increasing BS. Noted that A1C 6.8%. Will plan to contact patient today to discuss results.   Recommend:  Start Novolog Meal Coverage: Novolog 7 units TID with meals (hold if pt eats <50% of meal). Increase Lantus 18 Units QD.  Will need adjustments as steroids are tapered.   Thanks, Bronson Curb, MSN, RNC-OB Diabetes Coordinator [phone] (8a-5p)

## 2017-08-29 NOTE — Progress Notes (Signed)
SATURATION QUALIFICATIONS: (This note is used to comply with regulatory documentation for home oxygen)  Patient Saturations on Room Air at Rest = 92  Patient Saturations on Room Air while Ambulating = 87  Patient Saturations on 2 Liters of oxygen while Ambulating = 97%

## 2017-08-29 NOTE — Progress Notes (Signed)
Have decreased duoneb to prn Q4 , patient is on long acting Brovona neb.

## 2017-08-29 NOTE — Progress Notes (Signed)
Inpatient Diabetes Program Recommendations  AACE/ADA: New Consensus Statement on Inpatient Glycemic Control (2015)  Target Ranges:  Prepandial:   less than 140 mg/dL      Peak postprandial:   less than 180 mg/dL (1-2 hours)      Critically ill patients:  140 - 180 mg/dL   Lab Results  Component Value Date   GLUCAP 290 (H) 08/29/2017   HGBA1C 6.8 (H) 08/26/2017     Noted A1C of 6.8%, diabetes diagnosis is 6.5%. MD please advise for new onset teaching.   Thanks, Bronson Curb, MSN, RNC-OB Diabetes Coordinator [phone] (8a-5p)

## 2017-08-29 NOTE — Progress Notes (Signed)
Subjective: She seems better today.  She apparently was confused about her CT report so I am gone over it with her again today and she expresses that she understands better but I am not sure.  She has groundglass opacities with a very wide differential but I think in her circumstance is the most likely thing is that she has respiratory bronchiolitis from smoking.  She is not as short of breath.  She is not wheezing is much.  Objective: Vital signs in last 24 hours: Temp:  [97.8 F (36.6 C)-98.5 F (36.9 C)] 97.8 F (36.6 C) (02/25 0500) Pulse Rate:  [81-94] 81 (02/25 0500) Resp:  [20] 20 (02/25 0500) BP: (126-135)/(68-80) 135/68 (02/25 0500) SpO2:  [93 %-98 %] 97 % (02/25 0743) Weight:  [104.6 kg (230 lb 9.6 oz)] 104.6 kg (230 lb 9.6 oz) (02/25 0500) Weight change: -0.801 kg (-12.2 oz) Last BM Date: 08/24/17  Intake/Output from previous day: 02/24 0701 - 02/25 0700 In: 360 [P.O.:360] Out: 200 [Urine:200]  PHYSICAL EXAM General appearance: alert, cooperative and mild distress Resp: Still some rhonchi and wheezing but better than yesterday and much better than the day before Cardio: regular rate and rhythm, S1, S2 normal, no murmur, click, rub or gallop GI: soft, non-tender; bowel sounds normal; no masses,  no organomegaly Extremities: extremities normal, atraumatic, no cyanosis or edema Still very anxious  Lab Results:  Results for orders placed or performed during the hospital encounter of 08/25/17 (from the past 48 hour(s))  Glucose, capillary     Status: Abnormal   Collection Time: 08/27/17 11:36 AM  Result Value Ref Range   Glucose-Capillary 297 (H) 65 - 99 mg/dL  Glucose, capillary     Status: Abnormal   Collection Time: 08/27/17 11:38 AM  Result Value Ref Range   Glucose-Capillary 352 (H) 65 - 99 mg/dL   Comment 1 Notify RN   Glucose, capillary     Status: Abnormal   Collection Time: 08/27/17  4:14 PM  Result Value Ref Range   Glucose-Capillary 328 (H) 65 - 99 mg/dL    Comment 1 Notify RN    Comment 2 Document in Chart   Glucose, capillary     Status: Abnormal   Collection Time: 08/27/17  9:42 PM  Result Value Ref Range   Glucose-Capillary 313 (H) 65 - 99 mg/dL   Comment 1 Notify RN    Comment 2 Document in Chart   CBC     Status: Abnormal   Collection Time: 08/28/17  5:57 AM  Result Value Ref Range   WBC 18.3 (H) 4.0 - 10.5 K/uL   RBC 4.09 3.87 - 5.11 MIL/uL   Hemoglobin 13.0 12.0 - 15.0 g/dL   HCT 38.7 36.0 - 46.0 %   MCV 94.6 78.0 - 100.0 fL   MCH 31.8 26.0 - 34.0 pg   MCHC 33.6 30.0 - 36.0 g/dL   RDW 13.5 11.5 - 15.5 %   Platelets 377 150 - 400 K/uL    Comment: Performed at [facility], [address] [place], [place] [address]  Basic metabolic panel     Status: Abnormal   Collection Time: 08/28/17  5:57 AM  Result Value Ref Range   Sodium 132 (L) 135 - 145 mmol/L   Potassium 4.2 3.5 - 5.1 mmol/L   Chloride 94 (L) 101 - 111 mmol/L   CO2 26 22 - 32 mmol/L   Glucose, Bld 266 (H) 65 - 99 mg/dL   BUN 25 (H) 6 - 20  mg/dL   Creatinine, Ser 0.60 0.44 - 1.00 mg/dL   Calcium 10.2 8.9 - 10.3 mg/dL   GFR calc non Af Amer >60 >60 mL/min   GFR calc Af Amer >60 >60 mL/min    Comment: (NOTE) The eGFR has been calculated using the CKD EPI equation. This calculation has not been validated in all clinical situations. eGFR's persistently <60 mL/min signify possible Chronic Kidney Disease.    Anion gap 12 5 - 15    Comment: Performed at [facility], [address], [place], [place] [address]  Glucose, capillary     Status: Abnormal   Collection Time: 08/28/17  7:24 AM  Result Value Ref Range   Glucose-Capillary 261 (H) 65 - 99 mg/dL   Comment 1 Notify RN   Glucose, capillary     Status: Abnormal   Collection Time: 08/28/17 12:01 PM  Result Value Ref Range   Glucose-Capillary 327 (H) 65 - 99 mg/dL   Comment 1 Notify RN   Glucose, capillary     Status: Abnormal   Collection Time: 08/28/17  5:26 PM  Result Value Ref Range   Glucose-Capillary  263 (H) 65 - 99 mg/dL   Comment 1 Notify RN    Comment 2 Document in Chart   Glucose, capillary     Status: Abnormal   Collection Time: 08/28/17  8:31 PM  Result Value Ref Range   Glucose-Capillary 279 (H) 65 - 99 mg/dL   Comment 1 Notify RN    Comment 2 Document in Chart   CBC     Status: Abnormal   Collection Time: 08/29/17  6:14 AM  Result Value Ref Range   WBC 14.8 (H) 4.0 - 10.5 K/uL   RBC 3.96 3.87 - 5.11 MIL/uL   Hemoglobin 12.7 12.0 - 15.0 g/dL   HCT 37.3 36.0 - 46.0 %   MCV 94.2 78.0 - 100.0 fL   MCH 32.1 26.0 - 34.0 pg   MCHC 34.0 30.0 - 36.0 g/dL   RDW 13.6 11.5 - 15.5 %   Platelets 365 150 - 400 K/uL    Comment: Performed at [facility], [address] [place], [place] [address]  Basic metabolic panel     Status: Abnormal   Collection Time: 08/29/17  6:14 AM  Result Value Ref Range   Sodium 130 (L) 135 - 145 mmol/L   Potassium 4.4 3.5 - 5.1 mmol/L   Chloride 93 (L) 101 - 111 mmol/L   CO2 27 22 - 32 mmol/L   Glucose, Bld 346 (H) 65 - 99 mg/dL   BUN 23 (H) 6 - 20 mg/dL   Creatinine, Ser 0.71 0.44 - 1.00 mg/dL   Calcium 10.2 8.9 - 10.3 mg/dL   GFR calc non Af Amer >60 >60 mL/min   GFR calc Af Amer >60 >60 mL/min    Comment: (NOTE) The eGFR has been calculated using the CKD EPI equation. This calculation has not been validated in all clinical situations. eGFR's persistently <60 mL/min signify possible Chronic Kidney Disease.    Anion gap 10 5 - 15    Comment: Performed at [facility], [address], [place], [place] [address]  Glucose, capillary     Status: Abnormal   Collection Time: 08/29/17  7:59 AM  Result Value Ref Range   Glucose-Capillary 326 (H) 65 - 99 mg/dL    ABGS No results for input(s): PHART, PO2ART, TCO2, HCO3 in the last 72 hours.  Invalid input(s): PCO2 CULTURES No results found for this  or any previous visit (from the past 240 hour(s)). Studies/Results: Ct Chest W Contrast  Result Date: 08/27/2017 CLINICAL DATA:  Inpatient.  Dyspnea. Hypotension. Current smoker. COPD. EXAM: CT CHEST WITH CONTRAST TECHNIQUE: Multidetector CT imaging of the chest was performed during intravenous contrast administration. CONTRAST:  59m ISOVUE-300 IOPAMIDOL (ISOVUE-300) INJECTION 61% COMPARISON:  08/25/2017 chest radiograph.  10/12/2013 chest CT. FINDINGS: Cardiovascular: Normal heart size. No significant pericardial fluid/thickening. Three-vessel coronary atherosclerosis. Atherosclerotic nonaneurysmal thoracic aorta. Normal caliber pulmonary arteries. No central pulmonary emboli. Mediastinum/Nodes: No discrete thyroid nodules. Unremarkable esophagus. No pathologically enlarged axillary, mediastinal or hilar lymph nodes. Lungs/Pleura: No pneumothorax. No pleural effusion. No acute consolidative airspace disease, lung masses or significant pulmonary nodules. There is prominent patchy ground-glass opacity throughout both lungs, with relative sparing at the lung bases. No significant regions traction bronchiectasis, architectural distortion or frank honeycombing. Upper abdomen: No acute abnormality. Musculoskeletal: No aggressive appearing focal osseous lesions. Mild thoracic spondylosis. IMPRESSION: 1. Prominent nonspecific patchy ground-glass opacity throughout both lungs with relative sparing at the lung bases. Smoking related interstitial lung disease (respiratory bronchiolitis-interstitial lung disease (RB-ILD)) is a consideration in this current smoker (per EPIC records). Other considerations include acute interstitial pneumonia (AIP), diffuse alveolar hemorrhage, drug toxicity or atypical infection (particularly if the patient is immunocompromised). 2. Three-vessel coronary atherosclerosis. Aortic Atherosclerosis (ICD10-I70.0). Electronically Signed   By: JIlona SorrelM.D.   On: 08/27/2017 11:45    Medications:  Prior to Admission:  Medications Prior to Admission  Medication Sig Dispense Refill Last Dose  . albuterol (PROVENTIL HFA;VENTOLIN HFA)  108 (90 Base) MCG/ACT inhaler Inhale 2 puffs into the lungs every 6 (six) hours as needed for wheezing or shortness of breath.   08/25/2017 at Unknown time  . albuterol (PROVENTIL) (2.5 MG/3ML) 0.083% nebulizer solution Take 2.5 mg by nebulization every 6 (six) hours as needed for wheezing or shortness of breath.   08/25/2017 at Unknown time  . albuterol-ipratropium (COMBIVENT) 18-103 MCG/ACT inhaler Inhale into the lungs every 4 (four) hours.   08/25/2017 at Unknown time  . baclofen (LIORESAL) 10 MG tablet Take 10 mg by mouth 3 (three) times daily.   08/25/2017 at Unknown time  . conjugated estrogens (PREMARIN) vaginal cream Place 1 Applicatorful vaginally daily. 1 gram daily 42.5 g 12 08/25/2017 at Unknown time  . cycloSPORINE (RESTASIS) 0.05 % ophthalmic emulsion Place 1 drop into both eyes 2 (two) times daily.     . fluticasone (FLONASE) 50 MCG/ACT nasal spray Place 1 spray into both nostrils 2 (two) times daily as needed for allergies or rhinitis. 16 g 6 08/25/2017 at Unknown time  . hydrochlorothiazide (HYDRODIURIL) 25 MG tablet Take 25 mg by mouth daily.   08/25/2017 at Unknown time  . lisinopril (PRINIVIL,ZESTRIL) 20 MG tablet TAKE ONE TABLET BY MOUTH DAILY. 30 tablet 0 08/25/2017 at Unknown time  . lovastatin (MEVACOR) 40 MG tablet Take 40 mg by mouth at bedtime.   Past Week at Unknown time  . nabumetone (RELAFEN) 500 MG tablet Take 1 tablet (500 mg total) by mouth 2 (two) times daily. 60 tablet 5 08/25/2017 at Unknown time  . nitroGLYCERIN (NITROSTAT) 0.4 MG SL tablet DISSOLVE 1 TABLET UNDER THE TONGUE EVERY 5 MINUTES IF NEEDED FOR CHEST PAIN. MAX 3 DOSES THEN CALL 911. 25 tablet 3 Taking  . rOPINIRole (REQUIP) 1 MG tablet Take 1 tablet by mouth at bedtime.      . traMADol-acetaminophen (ULTRACET) 37.5-325 MG tablet Take 1 tablet by mouth every 6 (six) hours as needed. 2[place]  tablet 0 08/25/2017 at Unknown time  . furosemide (LASIX) 40 MG tablet Take 1 tablet (40 mg total) by mouth daily. (Patient not  taking: Reported on 08/26/2017) 30 tablet 3 Not Taking at Unknown time  . lidocaine (XYLOCAINE) 2 % solution TAKE 15 MLS BY MOUTH EVERY 6 HOURS AS NEEDED. (Patient not taking: Reported on 08/26/2017) 200 mL 0 Not Taking at Unknown time  . promethazine (PHENERGAN) 25 MG tablet TAKE (1) TABLET BY MOUTH EVERY SIX HOURS AS NEEDED. (Patient not taking: Reported on 07/12/2017) 30 tablet 0 Not Taking at Unknown time   Scheduled: . arformoterol  15 mcg Nebulization BID  . baclofen  10 mg Oral TID  . budesonide (PULMICORT) nebulizer solution  0.5 mg Nebulization BID  . cycloSPORINE  1 drop Both Eyes BID  . enoxaparin (LOVENOX) injection  40 mg Subcutaneous Q24H  . furosemide  40 mg Oral Daily  . guaiFENesin  1,200 mg Oral BID  . insulin aspart  0-20 Units Subcutaneous TID WC  . insulin aspart  0-5 Units Subcutaneous QHS  . insulin glargine  14 Units Subcutaneous Daily  . lisinopril  20 mg Oral Daily  . loratadine  10 mg Oral Daily  . mouth rinse  15 mL Mouth Rinse BID  . methylPREDNISolone (SOLU-MEDROL) injection  60 mg Intravenous Q12H  . nabumetone  500 mg Oral BID  . nicotine  21 mg Transdermal Daily  . pantoprazole  40 mg Oral Q0600  . pravastatin  40 mg Oral q1800  . rOPINIRole  1 mg Oral QHS  . sodium chloride flush  3 mL Intravenous Q12H   Continuous: . sodium chloride    . levofloxacin (LEVAQUIN) IV Stopped (08/28/17 2220)   PPJ:KDTOIZ chloride, acetaminophen **OR** acetaminophen, bisacodyl, fluticasone, gi cocktail, HYDROcodone-homatropine, ipratropium-albuterol, LORazepam, ondansetron **OR** ondansetron (ZOFRAN) IV, senna-docusate, sodium chloride flush, traMADol-acetaminophen  Assesment: She has COPD with acute exacerbation.  She has abnormal chest CT with groundglass opacities.  She is on appropriate treatment for both.  Her echocardiogram was essentially normal  She has severe anxiety associated with what I believe bipolar disease which complicates her treatment Principal  Problem:   COPD with acute exacerbation ([place]) Active Problems:   GERD without esophagitis   Hypertension   Anxiety   Chronic pain   Hyperglycemia    Plan: Continue medications and treatments    LOS: 4 days   Aviance Cooperwood L 08/29/2017, 8:13 AM

## 2017-08-30 DIAGNOSIS — J84115 Respiratory bronchiolitis interstitial lung disease: Secondary | ICD-10-CM | POA: Diagnosis present

## 2017-08-30 DIAGNOSIS — E119 Type 2 diabetes mellitus without complications: Secondary | ICD-10-CM

## 2017-08-30 LAB — CBC
HEMATOCRIT: 37.5 % (ref 36.0–46.0)
HEMOGLOBIN: 12.3 g/dL (ref 12.0–15.0)
MCH: 31.2 pg (ref 26.0–34.0)
MCHC: 32.8 g/dL (ref 30.0–36.0)
MCV: 95.2 fL (ref 78.0–100.0)
Platelets: 388 10*3/uL (ref 150–400)
RBC: 3.94 MIL/uL (ref 3.87–5.11)
RDW: 13.5 % (ref 11.5–15.5)
WBC: 15 10*3/uL — ABNORMAL HIGH (ref 4.0–10.5)

## 2017-08-30 LAB — BASIC METABOLIC PANEL
ANION GAP: 9 (ref 5–15)
BUN: 25 mg/dL — ABNORMAL HIGH (ref 6–20)
CHLORIDE: 91 mmol/L — AB (ref 101–111)
CO2: 28 mmol/L (ref 22–32)
Calcium: 10 mg/dL (ref 8.9–10.3)
Creatinine, Ser: 0.66 mg/dL (ref 0.44–1.00)
GFR calc non Af Amer: >60 mL/min (ref >60)
Glucose, Bld: 316 mg/dL — ABNORMAL HIGH (ref 65–99)
POTASSIUM: 4.3 mmol/L (ref 3.5–5.1)
Sodium: 128 mmol/L — ABNORMAL LOW (ref 135–145)

## 2017-08-30 LAB — GLUCOSE, CAPILLARY
Glucose-Capillary: 280 mg/dL — ABNORMAL HIGH (ref 65–99)
Glucose-Capillary: 349 mg/dL — ABNORMAL HIGH (ref 65–99)
Glucose-Capillary: 80 mg/dL (ref 65–99)
Glucose-Capillary: 300 mg/dL — ABNORMAL HIGH (ref 65–99)

## 2017-08-30 LAB — STREP PNEUMONIAE URINARY ANTIGEN: STREP PNEUMO URINARY ANTIGEN: NEGATIVE

## 2017-08-30 MED ORDER — INSULIN GLARGINE 100 UNIT/ML ~~LOC~~ SOLN
6.0000 [IU] | Freq: Once | SUBCUTANEOUS | Status: AC
  Administered 2017-08-30: 6 [IU] via SUBCUTANEOUS
  Filled 2017-08-30: qty 0.06

## 2017-08-30 MED ORDER — INSULIN GLARGINE 100 UNIT/ML ~~LOC~~ SOLN
24.0000 [IU] | Freq: Every day | SUBCUTANEOUS | Status: DC
  Administered 2017-08-31: 24 [IU] via SUBCUTANEOUS
  Filled 2017-08-30 (×2): qty 0.24

## 2017-08-30 NOTE — Progress Notes (Signed)
Pt became very anxious, stating she could not breathe well. O2 sat was 96% on 2L. Pt give PRN to help relax and reassured all was well. Continue to monitor. PRN seems effective as PT is now resting.

## 2017-08-30 NOTE — Evaluation (Signed)
Physical Therapy Evaluation Patient Details Name: [name] MRN: [id number] DOB: 1956-01-19 Today's Date: 08/30/2017   History of Present Illness  [name] is a 62yo white female who comes to [facility] on 2/21 with increased couginh dyspnea, and wheezxing, admitted in COPD exacerbation. PMH: COPD, HTN, chronic pain.   Clinical Impression  Pt admitted with above diagnosis. Pt currently with functional limitations due to the deficits listed below (see "PT Problem List"). Pt received on 2LPM O2 c SpO2: 95%, trialed 275f AMB on 2LPM c SpO2:93%. Plan is for patient to go home O2. Pt surprised to learn that mouth-breathing will not allow for delivery of supplemental O2 via nasal canula.   All functional mobility is performed with modified independence, the patient AMB slowly and cautiously as not to aggravate SOB, but after 1719fDOE increases to point where patient is no longer able to hold a conversation and walk simultaneously, although she AMB 24057fPt estimates mild loss of strength compared to PTA, and mild loss of gait stability since PTA. Patient is at near baseline, all education completed, and time is given to address all questions/concerns. No additional skilled PT services needed at this time, PT signing off. PT recommends daily ambulation ad lib or with nursing staff as needed to prevent deconditioning.      Follow Up Recommendations Home health PT    Equipment Recommendations  None recommended by PT    Recommendations for Other Services       Precautions / Restrictions Precautions Precautions: None Restrictions Weight Bearing Restrictions: No      Mobility  Bed Mobility Overal bed mobility: Independent                Transfers Overall transfer level: Independent                  Ambulation/Gait Ambulation/Gait assistance: Modified independent (Device/Increase time) Ambulation Distance (Feet): 240 Feet Assistive device: None        General Gait Details: PT assists with 53f42f tubing; 18 turns, AMB in room; difficulty  talking while AMB after [id number] 2LPM 93% terminal SpO2  Stairs            Wheelchair Mobility    Modified Rankin (Stroke Patients Only)       Balance Overall balance assessment: Independent                                           Pertinent Vitals/Pain Pain Assessment: 0-10 Pain Score: 4  Pain Location: Headache, being followed by Dr. HarriAline Brochureutpatient Pain Descriptors / Indicators: Aching Pain Intervention(s): Limited activity within patient's tolerance;Monitored during session;Premedicated before session    Home Living Family/patient expects to be discharged to:: Private residence Living Arrangements: Spouse/significant other Available Help at Discharge: Family Type of Home: Mobile home Home Access: Stairs to enter Entrance Stairs-Rails: Can reach both Entrance Stairs-Number of Steps: 2 Home Layout: One level Home Equipment: Shower seat Additional Comments: uses a walkign stick outside for long distances    Prior Function Level of Independence: Independent with assistive device(s)(Neither patient, nor husband drive, use [facility] for MD appointments ro friends for transportation. )         Comments: uses a walkign stick to AMB outside, gravel roads a few times a week up to "half a mile," and uses electric cart when grocery shopping.  Hand Dominance        Extremity/Trunk Assessment        Lower Extremity Assessment Lower Extremity Assessment: Overall WFL for tasks assessed       Communication   Communication: No difficulties  Cognition Arousal/Alertness: Awake/alert Behavior During Therapy: WFL for tasks assessed/performed Overall Cognitive Status: Within Functional Limits for tasks assessed                                        General Comments      Exercises     Assessment/Plan    PT Assessment All  further PT needs can be met in the next venue of care  PT Problem List Decreased mobility;Decreased activity tolerance;Cardiopulmonary status limiting activity       PT Treatment Interventions      PT Goals (Current goals can be found in the Care Plan section)  Acute Rehab PT Goals PT Goal Formulation: All assessment and education complete, DC therapy    Frequency     Barriers to discharge        Co-evaluation               AM-PAC PT "6 Clicks" Daily Activity  Outcome Measure Difficulty turning over in bed (including adjusting bedclothes, sheets and blankets)?: None Difficulty moving from lying on back to sitting on the side of the bed? : None Difficulty sitting down on and standing up from a chair with arms (e.g., wheelchair, bedside commode, etc,.)?: A Little Help needed moving to and from a bed to chair (including a wheelchair)?: None Help needed walking in hospital room?: A Little Help needed climbing 3-5 steps with a railing? : A Little 6 Click Score: 21    End of Session Equipment Utilized During Treatment: Oxygen Activity Tolerance: Patient tolerated treatment well;Patient limited by fatigue Patient left: in chair;with nursing/sitter in room;with call bell/phone within reach Nurse Communication: Mobility status PT Visit Diagnosis: Muscle weakness (generalized) (M62.81);Difficulty in walking, not elsewhere classified (R26.2)    Time: 9872-1587 PT Time Calculation (min) (ACUTE ONLY): 23 min   Charges:   PT Evaluation $PT Eval Low Complexity: 1 Low PT Treatments $Therapeutic Activity: 8-22 mins   PT G Codes:       9:30 AM, 2017-09-24 Etta Grandchild, PT, DPT Physical Therapist - [place] [phone] (Office)    Buccola,Allan C 09/24/2017, 9:26 AM

## 2017-08-30 NOTE — Progress Notes (Signed)
Inpatient Diabetes Program Recommendations  AACE/ADA: New Consensus Statement on Inpatient Glycemic Control (2015)  Target Ranges:  Prepandial:   less than 140 mg/dL      Peak postprandial:   less than 180 mg/dL (1-2 hours)      Critically ill patients:  140 - 180 mg/dL   Lab Results  Component Value Date   GLUCAP 184 (H) 08/29/2017   HGBA1C 6.8 (H) 08/26/2017    Review of Glycemic Control Results for [name], [name] (MRN [id number]) as of 08/30/2017 09:37  Ref. Range 08/29/2017 07:59 08/29/2017 12:19 08/29/2017 16:43 08/29/2017 20:44  Glucose-Capillary Latest Ref Range: 65 - 99 mg/dL 326 (H) 290 (H) 127 (H) 184 (H)   Diabetes history:NONE Outpatient Diabetes medications:NA Current orders:Novolog Resistant Correction Scale/ SSI (0-20 units) TID AC + HS Lantus 18 units daily                              Novolog 7 Units TIDAC                            Solumedrol 60 mg Q12H, causing increasing BS.  Inpatient Diabetes Program Recommendations:    Consider changing diet to carb modified.   Increase basal to Lantus 26 Units QD (105.5 kg X 0.25). Patient received a total of 61 Units of short acting within 24 hours on 08/29/17. Insulin may need further adjustments with steroid taper.  According to American Diabetes Association, A1C of 6.8% confirms DM diagnosis. Happy to assist in patient education, once diagnosis has been made. Anticipate need for oral agent at discharge.   Thanks, Bronson Curb, MSN, RNC-OB Diabetes Coordinator [phone] (8a-5p)

## 2017-08-30 NOTE — Progress Notes (Signed)
PROGRESS NOTE    VELA RENDER  HKV:[id number] DOB: May 22, 1956 DOA: 08/25/2017 PCP: Jani Gravel, MD   Brief Narrative:  Patient is a 62 year old female history of COPD, hypertension, chronic pain presented to the ED with cough, dyspnea, wheezing, worsening shortness of breath at rest and on exertion.  Patient seen in the ED and noted to be tachypneic with sats in the low 90s.  Patient given a dose of IV Solu-Medrol, Levaquin, duo nebs and albuterol nebs x2.  Patient with ongoing dyspnea at rest and was admitted for management of COPD exacerbation.   Assessment & Plan:   Principal Problem:   COPD with acute exacerbation ([place]) Active Problems:   GERD without esophagitis   Hypertension   Anxiety   Chronic pain   Hyperglycemia   DM (diabetes mellitus), type 2 ([place])   Respiratory bronchiolitis associated interstitial lung disease ([place])  #1 acute COPD exacerbation Questionable etiology.  Patient states shortness of breath slowly improving since admission however not at baseline.  CT chest with extensive groundglass opacities with concern for respiratory bronchiolitis.  Patient complained of being anxious thinks may be secondary to steroids. Continue IV steroids to 60 mg IV every 12 hours likely transition to oral prednisone in the next 24-48 hours..  Continue Mucinex, Pulmicort, Brovana, Claritin, Protonix, Flonase, scheduled duo nebs, Levaquin.  Pulmonary following and appreciate input and recommendations.    2.  Gastroesophageal reflux without esophagitis Continue PPI.  3.  Hyperglycemia/new onset diabetes Patient with no prior history of diabetes mellitus.  Elevated CBGs likely secondary to IV steroids.  Hemoglobin A1c at 6.8.  CBGs ranging from 280-349.  Increase Lantus to 24 units daily. Continue Novolog 7 units with meals.  Continue sliding scale insulin. Monitor CBGs closely and titrate insulin with steroid taper.  Will likely need outpatient follow-up for new onset diabetes  and either lifestyle modification with diet and exercise versus oral hypoglycemic agents.  4.  Hypertension Blood pressure stable.  HCTZ has been discontinued due to worsening hyponatremia.  The patient has been started on home regimen of Lasix.  Continue lisinopril and titrate as needed for better blood pressure control.   5.  Anxiety Patient noted to be anxious over the past few days and subsequently placed on Ativan as needed every 8 hours.  We will not escalate current dose of Ativan.  Follow.  6.  Chronic pain Stable.  Continue home regimen of scheduled relafen and Ultracet as needed.  7.  Hyponatremia Sodium levels trending back up and fluctuating with discontinuation of HCTZ.  Follow.   DVT prophylaxis: Lovenox  Code Status full Family Communication: Updated patient.  No family present. Disposition Plan: Home with home health when medically stable with clinical improvement.    Consultants:   Pulmonary: Dr. Luan Pulling 08/27/2017  Procedures:   Chest x-ray 08/25/2017  CT chest 08/27/2017  Antimicrobials:   IV Levaquin 08/25/2017>>>> oral Levaquin 08/29/2017   Subjective: Patient sitting up on the side of the bed on the telephone.  States she feels her breathing is improved.  Patient noted to be significantly anxious last night.    Objective: Vitals:   08/29/17 2100 08/29/17 2246 08/30/17 0500 08/30/17 0910  BP: 90/74  (!) 114/48   Pulse: 89  90 91  Resp: 18  19   Temp: 97.8 F (36.6 C)  98.6 F (37 C)   TempSrc: Oral  Oral   SpO2: 96% 97% 96% 92%  Weight:   105.4 kg (232 lb 5.8 oz)  Height:        Intake/Output Summary (Last 24 hours) at 08/30/2017 1138 Last data filed at 08/30/2017 0205 Gross per 24 hour  Intake 843 ml  Output 1450 ml  Net -607 ml   Filed Weights   08/28/17 0500 08/29/17 0500 08/30/17 0500  Weight: 105.4 kg (232 lb 5.8 oz) 104.6 kg (230 lb 9.6 oz) 105.4 kg (232 lb 5.8 oz)    Examination:  General exam: NAD Respiratory system: Fair air  movement.  Less tight.  Minimal Expiratory wheezing.  Less rhonchorous.  No crackles.  Cardiovascular system: Regular rate and rhythm no murmurs rubs or gallops.  No JVD.  1+ bilateral lower extremity edema.  Gastrointestinal system: Abdomen is nontender, nondistended, soft, positive bowel sounds.  No rebound.  No guarding.  Central nervous system: Alert and oriented. No focal neurological deficits. Extremities: Symmetric 5 x 5 power. Skin: No rashes, lesions or ulcers Psychiatry: Judgement and insight appear normal. Mood & affect appropriate.     Data Reviewed: I have personally reviewed following labs and imaging studies  CBC: Recent Labs  Lab 08/25/17 1959 08/26/17 0511 08/27/17 0603 08/28/17 0557 08/29/17 0614 08/30/17 0550  WBC 14.5* 11.9* 20.7* 18.3* 14.8* 15.0*  NEUTROABS 10.8* 10.7*  --   --   --   --   HGB 13.2 12.3 12.4 13.0 12.7 12.3  HCT 39.0 36.8 37.6 38.7 37.3 37.5  MCV 94.0 95.1 94.7 94.6 94.2 95.2  PLT 295 272 331 377 365 476   Basic Metabolic Panel: Recent Labs  Lab 08/26/17 0511 08/27/17 0603 08/28/17 0557 08/29/17 0614 08/30/17 0550  NA 133* 128* 132* 130* 128*  K 4.5 4.2 4.2 4.4 4.3  CL 97* 91* 94* 93* 91*  CO2 23 27 26 27 28   GLUCOSE 369* 314* 266* 346* 316*  BUN 23* 28* 25* 23* 25*  CREATININE 0.84 0.70 0.60 0.71 0.66  CALCIUM 10.0 9.9 10.2 10.2 10.0   GFR: Estimated Creatinine Clearance: 87.9 mL/min (by C-G formula based on SCr of 0.66 mg/dL). Liver Function Tests: Recent Labs  Lab 08/25/17 1959  AST 20  ALT 19  ALKPHOS 75  BILITOT 0.2*  PROT 7.0  ALBUMIN 3.1*   No results for input(s): LIPASE, AMYLASE in the last 168 hours. No results for input(s): AMMONIA in the last 168 hours. Coagulation Profile: No results for input(s): INR, PROTIME in the last 168 hours. Cardiac Enzymes: Recent Labs  Lab 08/25/17 1959  TROPONINI <0.03   BNP (last 3 results) No results for input(s): PROBNP in the last 8760 hours. HbA1C: No results for  input(s): HGBA1C in the last 72 hours. CBG: Recent Labs  Lab 08/28/17 2031 08/29/17 0759 08/29/17 1219 08/29/17 1643 08/29/17 2044  GLUCAP 279* 326* 290* 127* 184*   Lipid Profile: No results for input(s): CHOL, HDL, LDLCALC, TRIG, CHOLHDL, LDLDIRECT in the last 72 hours. Thyroid Function Tests: No results for input(s): TSH, T4TOTAL, FREET4, T3FREE, THYROIDAB in the last 72 hours. Anemia Panel: No results for input(s): VITAMINB12, FOLATE, FERRITIN, TIBC, IRON, RETICCTPCT in the last 72 hours. Sepsis Labs: Recent Labs  Lab 08/25/17 1959 08/26/17 0511 08/27/17 0603  PROCALCITON 0.11 <0.10 <0.10    No results found for this or any previous visit (from the past 240 hour(s)).       Radiology Studies: No results found.      Scheduled Meds: . arformoterol  15 mcg Nebulization BID  . baclofen  10 mg Oral TID  . budesonide (PULMICORT) nebulizer solution  0.5 mg Nebulization BID  . cycloSPORINE  1 drop Both Eyes BID  . enoxaparin (LOVENOX) injection  40 mg Subcutaneous Q24H  . furosemide  40 mg Oral Daily  . guaiFENesin  1,200 mg Oral BID  . insulin aspart  0-20 Units Subcutaneous TID WC  . insulin aspart  0-5 Units Subcutaneous QHS  . insulin aspart  7 Units Subcutaneous TID WC  . [START ON 08/31/2017] insulin glargine  24 Units Subcutaneous Daily  . insulin glargine  6 Units Subcutaneous Once  . levofloxacin  750 mg Oral Daily  . lisinopril  20 mg Oral Daily  . loratadine  10 mg Oral Daily  . mouth rinse  15 mL Mouth Rinse BID  . methylPREDNISolone (SOLU-MEDROL) injection  60 mg Intravenous Q12H  . nabumetone  500 mg Oral BID  . nicotine  21 mg Transdermal Daily  . pantoprazole  40 mg Oral Q0600  . pravastatin  40 mg Oral q1800  . rOPINIRole  1 mg Oral QHS  . sodium chloride flush  3 mL Intravenous Q12H   Continuous Infusions: . sodium chloride       LOS: 5 days    Time spent: 35 mins    Irine Seal, MD [facility] Pager [phone]  [phone]  If 7PM-7AM, please contact night-coverage [email or web address] Password [facility] 08/30/2017, 11:38 AM

## 2017-08-30 NOTE — Care Management Note (Signed)
Case Management Note  Patient Details  Name: [name] MRN: [id number] Date of Birth: 22-Oct-1955   If discussed at Long Length of Stay Meetings, dates discussed:   08/30/2017 Additional Comments:  Guhan Bruington, Chauncey Reading, RN 08/30/2017, 4:16 PM

## 2017-08-30 NOTE — Progress Notes (Signed)
Subjective: She had an episode of severe anxiety this morning and received Ativan she asked me about increasing the dose of Ativan but considering her COPD I think that is probably not a good idea.  Her breathing is definitely better.  She has no other new complaints.  Objective: Vital signs in last 24 hours: Temp:  [97.8 F (36.6 C)-98.6 F (37 C)] 98.6 F (37 C) (02/26 0500) Pulse Rate:  [88-90] 90 (02/26 0500) Resp:  [18-19] 19 (02/26 0500) BP: (90-125)/(48-76) 114/48 (02/26 0500) SpO2:  [96 %-97 %] 96 % (02/26 0500) Weight:  [105.4 kg (232 lb 5.8 oz)] 105.4 kg (232 lb 5.8 oz) (02/26 0500) Weight change: 0.801 kg (1 lb 12.2 oz) Last BM Date: 08/29/17  Intake/Output from previous day: 02/25 0701 - 02/26 0700 In: 1203 [P.O.:1200; I.V.:3] Out: 2100 [Urine:2100]  PHYSICAL EXAM General appearance: alert, cooperative and mild distress Resp: She has minimal end expiratory wheezing now Cardio: regular rate and rhythm, S1, S2 normal, no murmur, click, rub or gallop GI: soft, non-tender; bowel sounds normal; no masses,  no organomegaly Extremities: extremities normal, atraumatic, no cyanosis or edema  Lab Results:  Results for orders placed or performed during the hospital encounter of 08/25/17 (from the past 48 hour(s))  Glucose, capillary     Status: Abnormal   Collection Time: 08/28/17 12:01 PM  Result Value Ref Range   Glucose-Capillary 327 (H) 65 - 99 mg/dL   Comment 1 Notify RN   Glucose, capillary     Status: Abnormal   Collection Time: 08/28/17  5:26 PM  Result Value Ref Range   Glucose-Capillary 263 (H) 65 - 99 mg/dL   Comment 1 Notify RN    Comment 2 Document in Chart   Glucose, capillary     Status: Abnormal   Collection Time: 08/28/17  8:31 PM  Result Value Ref Range   Glucose-Capillary 279 (H) 65 - 99 mg/dL   Comment 1 Notify RN    Comment 2 Document in Chart   CBC     Status: Abnormal   Collection Time: 08/29/17  6:14 AM  Result Value Ref Range   WBC 14.8  (H) 4.0 - 10.5 K/uL   RBC 3.96 3.87 - 5.11 MIL/uL   Hemoglobin 12.7 12.0 - 15.0 g/dL   HCT 37.3 36.0 - 46.0 %   MCV 94.2 78.0 - 100.0 fL   MCH 32.1 26.0 - 34.0 pg   MCHC 34.0 30.0 - 36.0 g/dL   RDW 13.6 11.5 - 15.5 %   Platelets 365 150 - 400 K/uL    Comment: Performed at [facility], [address], [place],  [address]  Basic metabolic panel     Status: Abnormal   Collection Time: 08/29/17  6:14 AM  Result Value Ref Range   Sodium 130 (L) 135 - 145 mmol/L   Potassium 4.4 3.5 - 5.1 mmol/L   Chloride 93 (L) 101 - 111 mmol/L   CO2 27 22 - 32 mmol/L   Glucose, Bld 346 (H) 65 - 99 mg/dL   BUN 23 (H) 6 - 20 mg/dL   Creatinine, Ser 0.71 0.44 - 1.00 mg/dL   Calcium 10.2 8.9 - 10.3 mg/dL   GFR calc non Af Amer >60 >60 mL/min   GFR calc Af Amer >60 >60 mL/min    Comment: (NOTE) The eGFR has been calculated using the CKD EPI equation. This calculation has not been validated in all clinical situations. eGFR's persistently <60 mL/min signify possible Chronic Kidney Disease.  Anion gap 10 5 - 15    Comment: Performed at [facility], [address], [place], [place] [address]  Glucose, capillary     Status: Abnormal   Collection Time: 08/29/17  7:59 AM  Result Value Ref Range   Glucose-Capillary 326 (H) 65 - 99 mg/dL  Glucose, capillary     Status: Abnormal   Collection Time: 08/29/17 12:19 PM  Result Value Ref Range   Glucose-Capillary 290 (H) 65 - 99 mg/dL  Glucose, capillary     Status: Abnormal   Collection Time: 08/29/17  4:43 PM  Result Value Ref Range   Glucose-Capillary 127 (H) 65 - 99 mg/dL  Glucose, capillary     Status: Abnormal   Collection Time: 08/29/17  8:44 PM  Result Value Ref Range   Glucose-Capillary 184 (H) 65 - 99 mg/dL  CBC     Status: Abnormal   Collection Time: 08/30/17  5:50 AM  Result Value Ref Range   WBC 15.0 (H) 4.0 - 10.5 K/uL   RBC 3.94 3.87 - 5.11 MIL/uL   Hemoglobin 12.3 12.0 - 15.0 g/dL   HCT 37.5 36.0 - 46.0 %   MCV 95.2 78.0 - 100.0  fL   MCH 31.2 26.0 - 34.0 pg   MCHC 32.8 30.0 - 36.0 g/dL   RDW 13.5 11.5 - 15.5 %   Platelets 388 150 - 400 K/uL    Comment: Performed at [facility], [address] [place], [place] [address]  Basic metabolic panel     Status: Abnormal   Collection Time: 08/30/17  5:50 AM  Result Value Ref Range   Sodium 128 (L) 135 - 145 mmol/L   Potassium 4.3 3.5 - 5.1 mmol/L   Chloride 91 (L) 101 - 111 mmol/L   CO2 28 22 - 32 mmol/L   Glucose, Bld 316 (H) 65 - 99 mg/dL   BUN 25 (H) 6 - 20 mg/dL   Creatinine, Ser 0.66 0.44 - 1.00 mg/dL   Calcium 10.0 8.9 - 10.3 mg/dL   GFR calc non Af Amer >60 >60 mL/min   GFR calc Af Amer >60 >60 mL/min    Comment: (NOTE) The eGFR has been calculated using the CKD EPI equation. This calculation has not been validated in all clinical situations. eGFR's persistently <60 mL/min signify possible Chronic Kidney Disease.    Anion gap 9 5 - 15    Comment: Performed at [facility], [address], [place], [place] [address]    ABGS No results for input(s): PHART, PO2ART, TCO2, HCO3 in the last 72 hours.  Invalid input(s): PCO2 CULTURES No results found for this or any previous visit (from the past 240 hour(s)). Studies/Results: No results found.  Medications:  Prior to Admission:  Medications Prior to Admission  Medication Sig Dispense Refill Last Dose  . albuterol (PROVENTIL HFA;VENTOLIN HFA) 108 (90 Base) MCG/ACT inhaler Inhale 2 puffs into the lungs every 6 (six) hours as needed for wheezing or shortness of breath.   08/25/2017 at Unknown time  . albuterol (PROVENTIL) (2.5 MG/3ML) 0.083% nebulizer solution Take 2.5 mg by nebulization every 6 (six) hours as needed for wheezing or shortness of breath.   08/25/2017 at Unknown time  . albuterol-ipratropium (COMBIVENT) 18-103 MCG/ACT inhaler Inhale into the lungs every 4 (four) hours.   08/25/2017 at Unknown time  . baclofen (LIORESAL) 10 MG tablet Take 10 mg by mouth 3 (three) times daily.   08/25/2017 at  Unknown time  . conjugated estrogens (PREMARIN) vaginal cream  Place 1 Applicatorful vaginally daily. 1 gram daily 42.5 g 12 08/25/2017 at Unknown time  . cycloSPORINE (RESTASIS) 0.05 % ophthalmic emulsion Place 1 drop into both eyes 2 (two) times daily.     . fluticasone (FLONASE) 50 MCG/ACT nasal spray Place 1 spray into both nostrils 2 (two) times daily as needed for allergies or rhinitis. 16 g 6 08/25/2017 at Unknown time  . hydrochlorothiazide (HYDRODIURIL) 25 MG tablet Take 25 mg by mouth daily.   08/25/2017 at Unknown time  . lisinopril (PRINIVIL,ZESTRIL) 20 MG tablet TAKE ONE TABLET BY MOUTH DAILY. 30 tablet 0 08/25/2017 at Unknown time  . lovastatin (MEVACOR) 40 MG tablet Take 40 mg by mouth at bedtime.   Past Week at Unknown time  . nabumetone (RELAFEN) 500 MG tablet Take 1 tablet (500 mg total) by mouth 2 (two) times daily. 60 tablet 5 08/25/2017 at Unknown time  . nitroGLYCERIN (NITROSTAT) 0.4 MG SL tablet DISSOLVE 1 TABLET UNDER THE TONGUE EVERY 5 MINUTES IF NEEDED FOR CHEST PAIN. MAX 3 DOSES THEN CALL 911. 25 tablet 3 Taking  . rOPINIRole (REQUIP) 1 MG tablet Take 1 tablet by mouth at bedtime.      . traMADol-acetaminophen (ULTRACET) 37.5-325 MG tablet Take 1 tablet by mouth every 6 (six) hours as needed. 28 tablet 0 08/25/2017 at Unknown time  . furosemide (LASIX) 40 MG tablet Take 1 tablet (40 mg total) by mouth daily. (Patient not taking: Reported on 08/26/2017) 30 tablet 3 Not Taking at Unknown time  . lidocaine (XYLOCAINE) 2 % solution TAKE 15 MLS BY MOUTH EVERY 6 HOURS AS NEEDED. (Patient not taking: Reported on 08/26/2017) 200 mL 0 Not Taking at Unknown time  . promethazine (PHENERGAN) 25 MG tablet TAKE (1) TABLET BY MOUTH EVERY SIX HOURS AS NEEDED. (Patient not taking: Reported on 07/12/2017) 30 tablet 0 Not Taking at Unknown time   Scheduled: . arformoterol  15 mcg Nebulization BID  . baclofen  10 mg Oral TID  . budesonide (PULMICORT) nebulizer solution  0.5 mg Nebulization BID  .  cycloSPORINE  1 drop Both Eyes BID  . enoxaparin (LOVENOX) injection  40 mg Subcutaneous Q24H  . furosemide  40 mg Oral Daily  . guaiFENesin  1,200 mg Oral BID  . insulin aspart  0-20 Units Subcutaneous TID WC  . insulin aspart  0-5 Units Subcutaneous QHS  . insulin aspart  7 Units Subcutaneous TID WC  . insulin glargine  18 Units Subcutaneous Daily  . levofloxacin  750 mg Oral Daily  . lisinopril  20 mg Oral Daily  . loratadine  10 mg Oral Daily  . mouth rinse  15 mL Mouth Rinse BID  . methylPREDNISolone (SOLU-MEDROL) injection  60 mg Intravenous Q12H  . nabumetone  500 mg Oral BID  . nicotine  21 mg Transdermal Daily  . pantoprazole  40 mg Oral Q0600  . pravastatin  40 mg Oral q1800  . rOPINIRole  1 mg Oral QHS  . sodium chloride flush  3 mL Intravenous Q12H   Continuous: . sodium chloride     OZD:GUYQIH chloride, acetaminophen **OR** acetaminophen, bisacodyl, fluticasone, gi cocktail, HYDROcodone-homatropine, ipratropium-albuterol, LORazepam, ondansetron **OR** ondansetron (ZOFRAN) IV, senna-docusate, sodium chloride flush, traMADol-acetaminophen  Assesment: She has COPD with acute exacerbation.  She had abnormal chest CT.  This is most consistent with respiratory bronchiolitis considering her clinical course.  She is definitely getting better.  She has anxiety which is better after receiving Ativan.  She has chronic pain  She has continued  nicotine abuse but says that she will continue to use patches and hopes to stop smoking. Principal Problem:   COPD with acute exacerbation ([place]) Active Problems:   GERD without esophagitis   Hypertension   Anxiety   Chronic pain   Hyperglycemia    Plan: Continue treatments.    LOS: 5 days   Vance Hochmuth L 08/30/2017, 8:15 AM

## 2017-08-31 DIAGNOSIS — F419 Anxiety disorder, unspecified: Secondary | ICD-10-CM

## 2017-08-31 DIAGNOSIS — E119 Type 2 diabetes mellitus without complications: Secondary | ICD-10-CM

## 2017-08-31 DIAGNOSIS — J9601 Acute respiratory failure with hypoxia: Secondary | ICD-10-CM

## 2017-08-31 DIAGNOSIS — R609 Edema, unspecified: Secondary | ICD-10-CM

## 2017-08-31 DIAGNOSIS — R739 Hyperglycemia, unspecified: Secondary | ICD-10-CM

## 2017-08-31 DIAGNOSIS — J441 Chronic obstructive pulmonary disease with (acute) exacerbation: Principal | ICD-10-CM

## 2017-08-31 DIAGNOSIS — I10 Essential (primary) hypertension: Secondary | ICD-10-CM

## 2017-08-31 DIAGNOSIS — K219 Gastro-esophageal reflux disease without esophagitis: Secondary | ICD-10-CM

## 2017-08-31 DIAGNOSIS — G8929 Other chronic pain: Secondary | ICD-10-CM

## 2017-08-31 DIAGNOSIS — J84115 Respiratory bronchiolitis interstitial lung disease: Secondary | ICD-10-CM

## 2017-08-31 LAB — BASIC METABOLIC PANEL
ANION GAP: 9 (ref 5–15)
BUN: 26 mg/dL — AB (ref 6–20)
CHLORIDE: 90 mmol/L — AB (ref 101–111)
CO2: 30 mmol/L (ref 22–32)
Calcium: 10.1 mg/dL (ref 8.9–10.3)
Creatinine, Ser: 0.76 mg/dL (ref 0.44–1.00)
GFR calc Af Amer: >60 mL/min (ref >60)
GFR calc non Af Amer: >60 mL/min (ref >60)
GLUCOSE: 291 mg/dL — AB (ref 65–99)
POTASSIUM: 4.2 mmol/L (ref 3.5–5.1)
Sodium: 129 mmol/L — ABNORMAL LOW (ref 135–145)

## 2017-08-31 LAB — CBC
HEMATOCRIT: 39 % (ref 36.0–46.0)
Hemoglobin: 12.8 g/dL (ref 12.0–15.0)
MCH: 31.3 pg (ref 26.0–34.0)
MCHC: 32.8 g/dL (ref 30.0–36.0)
MCV: 95.4 fL (ref 78.0–100.0)
Platelets: 431 10*3/uL — ABNORMAL HIGH (ref 150–400)
RBC: 4.09 MIL/uL (ref 3.87–5.11)
RDW: 13.5 % (ref 11.5–15.5)
WBC: 15.8 10*3/uL — AB (ref 4.0–10.5)

## 2017-08-31 LAB — GLUCOSE, CAPILLARY: Glucose-Capillary: 260 mg/dL — ABNORMAL HIGH (ref 65–99)

## 2017-08-31 LAB — LEGIONELLA PNEUMOPHILA SEROGP 1 UR AG: L. pneumophila Serogp 1 Ur Ag: NEGATIVE

## 2017-08-31 MED ORDER — FUROSEMIDE 40 MG PO TABS: 40.0000 mg | ORAL_TABLET | Freq: Every day | ORAL | 0 refills | Status: DC

## 2017-08-31 MED ORDER — METFORMIN HCL 500 MG PO TABS: 500.0000 mg | ORAL_TABLET | Freq: Two times a day (BID) | ORAL | 0 refills | Status: DC

## 2017-08-31 MED ORDER — GUAIFENESIN ER 600 MG PO TB12: 1200.0000 mg | ORAL_TABLET | Freq: Two times a day (BID) | ORAL | 0 refills | Status: DC

## 2017-08-31 MED ORDER — NICOTINE 21 MG/24HR TD PT24: 21.0000 mg | MEDICATED_PATCH | Freq: Every day | TRANSDERMAL | 0 refills | Status: DC

## 2017-08-31 MED ORDER — PANTOPRAZOLE SODIUM 40 MG PO TBEC: 40.0000 mg | DELAYED_RELEASE_TABLET | Freq: Every day | ORAL | 0 refills | Status: DC

## 2017-08-31 MED ORDER — HYDROCODONE-HOMATROPINE 5-1.5 MG/5ML PO SYRP: 5.0000 mL | ORAL_SOLUTION | Freq: Four times a day (QID) | ORAL | 0 refills | Status: DC | PRN

## 2017-08-31 MED ORDER — PREDNISONE 10 MG PO TABS: ORAL_TABLET | ORAL | 0 refills | Status: DC

## 2017-08-31 MED ORDER — LORAZEPAM 0.5 MG PO TABS: 0.5000 mg | ORAL_TABLET | Freq: Three times a day (TID) | ORAL | 0 refills | Status: DC | PRN

## 2017-08-31 MED ORDER — BUDESONIDE 0.5 MG/2ML IN SUSP: 0.5000 mg | Freq: Two times a day (BID) | RESPIRATORY_TRACT | 0 refills | Status: DC

## 2017-08-31 MED ORDER — LORATADINE 10 MG PO TABS: 10.0000 mg | ORAL_TABLET | Freq: Every day | ORAL | 0 refills | Status: DC

## 2017-08-31 MED ORDER — PREDNISONE 20 MG PO TABS
60.0000 mg | ORAL_TABLET | Freq: Every day | ORAL | Status: DC
  Administered 2017-08-31: 60 mg via ORAL
  Filled 2017-08-31: qty 3
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
## 2017-08-31 NOTE — Progress Notes (Signed)
Subjective: She says she feels okay.  She feels like the way she has taken her medications and helped her and she likes having the long-acting bronchodilator and inhaled steroid in the nebulizer.  She is concerned about going home because of lack of funds to buy medication but otherwise she feels like she is okay to go.  Objective: Vital signs in last 24 hours: Temp:  [97.7 F (36.5 C)-98 F (36.7 C)] 98 F (36.7 C) (02/27 0500) Pulse Rate:  [80-91] 80 (02/27 0500) Resp:  [20] 20 (02/27 0500) BP: (121-125)/(50-60) 121/60 (02/27 0500) SpO2:  [92 %-99 %] 98 % (02/27 0500) Weight change:  Last BM Date: 08/30/17  Intake/Output from previous day: 02/26 0701 - 02/27 0700 In: 240 [P.O.:240] Out: 200 [Urine:200]  PHYSICAL EXAM General appearance: alert, cooperative, no distress and Obese and anxious Resp: clear to auscultation bilaterally Cardio: regular rate and rhythm, S1, S2 normal, no murmur, click, rub or gallop GI: soft, non-tender; bowel sounds normal; no masses,  no organomegaly Extremities: extremities normal, atraumatic, no cyanosis or edema  Lab Results:  Results for orders placed or performed during the hospital encounter of 08/25/17 (from the past 48 hour(s))  Glucose, capillary     Status: Abnormal   Collection Time: 08/29/17 12:19 PM  Result Value Ref Range   Glucose-Capillary 290 (H) 65 - 99 mg/dL  Glucose, capillary     Status: Abnormal   Collection Time: 08/29/17  4:43 PM  Result Value Ref Range   Glucose-Capillary 127 (H) 65 - 99 mg/dL  Glucose, capillary     Status: Abnormal   Collection Time: 08/29/17  8:44 PM  Result Value Ref Range   Glucose-Capillary 184 (H) 65 - 99 mg/dL  Strep pneumoniae urinary antigen     Status: None   Collection Time: 08/30/17 12:00 AM  Result Value Ref Range   Strep Pneumo Urinary Antigen NEGATIVE NEGATIVE    Comment:        Infection due to S. pneumoniae cannot be absolutely ruled out since the antigen present may be below  the detection limit of the test. Performed at [facility], [address], [place], [place] [address]   CBC     Status: Abnormal   Collection Time: 08/30/17  5:50 AM  Result Value Ref Range   WBC 15.0 (H) 4.0 - 10.5 K/uL   RBC 3.94 3.87 - 5.11 MIL/uL   Hemoglobin 12.3 12.0 - 15.0 g/dL   HCT 37.5 36.0 - 46.0 %   MCV 95.2 78.0 - 100.0 fL   MCH 31.2 26.0 - 34.0 pg   MCHC 32.8 30.0 - 36.0 g/dL   RDW 13.5 11.5 - 15.5 %   Platelets 388 150 - 400 K/uL    Comment: Performed at [facility], [address], [place], [place] [address]  Basic metabolic panel     Status: Abnormal   Collection Time: 08/30/17  5:50 AM  Result Value Ref Range   Sodium 128 (L) 135 - 145 mmol/L   Potassium 4.3 3.5 - 5.1 mmol/L   Chloride 91 (L) 101 - 111 mmol/L   CO2 28 22 - 32 mmol/L   Glucose, Bld 316 (H) 65 - 99 mg/dL   BUN 25 (H) 6 - 20 mg/dL   Creatinine, Ser 0.66 0.44 - 1.00 mg/dL   Calcium 10.0 8.9 - 10.3 mg/dL   GFR calc non Af Amer >60 >60 mL/min   GFR calc Af Amer >60 >60 mL/min    Comment: (  NOTE) The eGFR has been calculated using the CKD EPI equation. This calculation has not been validated in all clinical situations. eGFR's persistently <60 mL/min signify possible Chronic Kidney Disease.    Anion gap 9 5 - 15    Comment: Performed at [facility], [address] [place], [place] [address]  Glucose, capillary     Status: Abnormal   Collection Time: 08/30/17  7:48 AM  Result Value Ref Range   Glucose-Capillary 280 (H) 65 - 99 mg/dL   Comment 1 Notify RN    Comment 2 Document in Chart   Glucose, capillary     Status: Abnormal   Collection Time: 08/30/17 11:55 AM  Result Value Ref Range   Glucose-Capillary 349 (H) 65 - 99 mg/dL   Comment 1 Notify RN    Comment 2 Document in Chart   Glucose, capillary     Status: None   Collection Time: 08/30/17  4:24 PM  Result Value Ref Range   Glucose-Capillary 80 65 - 99 mg/dL   Comment 1 Notify RN    Comment 2 Document in Chart   Glucose,  capillary     Status: Abnormal   Collection Time: 08/30/17  9:52 PM  Result Value Ref Range   Glucose-Capillary 300 (H) 65 - 99 mg/dL   Comment 1 Notify RN    Comment 2 Document in Chart   CBC     Status: Abnormal   Collection Time: 08/31/17  5:49 AM  Result Value Ref Range   WBC 15.8 (H) 4.0 - 10.5 K/uL   RBC 4.09 3.87 - 5.11 MIL/uL   Hemoglobin 12.8 12.0 - 15.0 g/dL   HCT 39.0 36.0 - 46.0 %   MCV 95.4 78.0 - 100.0 fL   MCH 31.3 26.0 - 34.0 pg   MCHC 32.8 30.0 - 36.0 g/dL   RDW 13.5 11.5 - 15.5 %   Platelets 431 (H) 150 - 400 K/uL    Comment: Performed at [facility], [address], [place], [place] [address]  Basic metabolic panel     Status: Abnormal   Collection Time: 08/31/17  5:49 AM  Result Value Ref Range   Sodium 129 (L) 135 - 145 mmol/L   Potassium 4.2 3.5 - 5.1 mmol/L   Chloride 90 (L) 101 - 111 mmol/L   CO2 30 22 - 32 mmol/L   Glucose, Bld 291 (H) 65 - 99 mg/dL   BUN 26 (H) 6 - 20 mg/dL   Creatinine, Ser 0.76 0.44 - 1.00 mg/dL   Calcium 10.1 8.9 - 10.3 mg/dL   GFR calc non Af Amer >60 >60 mL/min   GFR calc Af Amer >60 >60 mL/min    Comment: (NOTE) The eGFR has been calculated using the CKD EPI equation. This calculation has not been validated in all clinical situations. eGFR's persistently <60 mL/min signify possible Chronic Kidney Disease.    Anion gap 9 5 - 15    Comment: Performed at [facility], [address] [place], [place] [address]    ABGS No results for input(s): PHART, PO2ART, TCO2, HCO3 in the last 72 hours.  Invalid input(s): PCO2 CULTURES No results found for this or any previous visit (from the past 240 hour(s)). Studies/Results: No results found.  Medications:  Prior to Admission:  Medications Prior to Admission  Medication Sig Dispense Refill Last Dose  . albuterol (PROVENTIL HFA;VENTOLIN HFA) 108 (90 Base) MCG/ACT inhaler Inhale 2 puffs into the lungs every 6 (six) hours as needed for wheezing  or shortness of breath.   08/25/2017  at Unknown time  . albuterol (PROVENTIL) (2.5 MG/3ML) 0.083% nebulizer solution Take 2.5 mg by nebulization every 6 (six) hours as needed for wheezing or shortness of breath.   08/25/2017 at Unknown time  . albuterol-ipratropium (COMBIVENT) 18-103 MCG/ACT inhaler Inhale into the lungs every 4 (four) hours.   08/25/2017 at Unknown time  . baclofen (LIORESAL) 10 MG tablet Take 10 mg by mouth 3 (three) times daily.   08/25/2017 at Unknown time  . conjugated estrogens (PREMARIN) vaginal cream Place 1 Applicatorful vaginally daily. 1 gram daily 42.5 g 12 08/25/2017 at Unknown time  . cycloSPORINE (RESTASIS) 0.05 % ophthalmic emulsion Place 1 drop into both eyes 2 (two) times daily.     . fluticasone (FLONASE) 50 MCG/ACT nasal spray Place 1 spray into both nostrils 2 (two) times daily as needed for allergies or rhinitis. 16 g 6 08/25/2017 at Unknown time  . hydrochlorothiazide (HYDRODIURIL) 25 MG tablet Take 25 mg by mouth daily.   08/25/2017 at Unknown time  . lisinopril (PRINIVIL,ZESTRIL) 20 MG tablet TAKE ONE TABLET BY MOUTH DAILY. 30 tablet 0 08/25/2017 at Unknown time  . lovastatin (MEVACOR) 40 MG tablet Take 40 mg by mouth at bedtime.   Past Week at Unknown time  . nabumetone (RELAFEN) 500 MG tablet Take 1 tablet (500 mg total) by mouth 2 (two) times daily. 60 tablet 5 08/25/2017 at Unknown time  . nitroGLYCERIN (NITROSTAT) 0.4 MG SL tablet DISSOLVE 1 TABLET UNDER THE TONGUE EVERY 5 MINUTES IF NEEDED FOR CHEST PAIN. MAX 3 DOSES THEN CALL 911. 25 tablet 3 Taking  . rOPINIRole (REQUIP) 1 MG tablet Take 1 tablet by mouth at bedtime.      . traMADol-acetaminophen (ULTRACET) 37.5-325 MG tablet Take 1 tablet by mouth every 6 (six) hours as needed. 28 tablet 0 08/25/2017 at Unknown time  . furosemide (LASIX) 40 MG tablet Take 1 tablet (40 mg total) by mouth daily. (Patient not taking: Reported on 08/26/2017) 30 tablet 3 Not Taking at Unknown time  . lidocaine (XYLOCAINE) 2 % solution TAKE 15 MLS BY MOUTH EVERY 6 HOURS  AS NEEDED. (Patient not taking: Reported on 08/26/2017) 200 mL 0 Not Taking at Unknown time  . promethazine (PHENERGAN) 25 MG tablet TAKE (1) TABLET BY MOUTH EVERY SIX HOURS AS NEEDED. (Patient not taking: Reported on 07/12/2017) 30 tablet 0 Not Taking at Unknown time   Scheduled: . arformoterol  15 mcg Nebulization BID  . baclofen  10 mg Oral TID  . budesonide (PULMICORT) nebulizer solution  0.5 mg Nebulization BID  . cycloSPORINE  1 drop Both Eyes BID  . enoxaparin (LOVENOX) injection  40 mg Subcutaneous Q24H  . furosemide  40 mg Oral Daily  . guaiFENesin  1,200 mg Oral BID  . insulin aspart  0-20 Units Subcutaneous TID WC  . insulin aspart  0-5 Units Subcutaneous QHS  . insulin aspart  7 Units Subcutaneous TID WC  . insulin glargine  24 Units Subcutaneous Daily  . levofloxacin  750 mg Oral Daily  . lisinopril  20 mg Oral Daily  . loratadine  10 mg Oral Daily  . mouth rinse  15 mL Mouth Rinse BID  . methylPREDNISolone (SOLU-MEDROL) injection  60 mg Intravenous Q12H  . nabumetone  500 mg Oral BID  . nicotine  21 mg Transdermal Daily  . pantoprazole  40 mg Oral Q0600  . pravastatin  40 mg Oral q1800  . rOPINIRole  1 mg Oral QHS  .  sodium chloride flush  3 mL Intravenous Q12H   Continuous: . sodium chloride     RFF:MBWGYK chloride, acetaminophen **OR** acetaminophen, bisacodyl, fluticasone, gi cocktail, HYDROcodone-homatropine, ipratropium-albuterol, LORazepam, ondansetron **OR** ondansetron (ZOFRAN) IV, senna-docusate, sodium chloride flush, traMADol-acetaminophen  Assesment: She has COPD with acute exacerbation.  She is better.  She had what looked like respiratory bronchiolitis on her CT and that should resolve as her COPD exacerbation gets better and if she can remain off of cigarettes.  She says she thinks she can stop smoking.  I had mistakenly said that she had acute on chronic hypoxic respiratory failure but this is acute respiratory failure with hypoxia.  She had been on oxygen  some years ago but not recently.  She has severe issues with anxiety that impacts her care Principal Problem:   COPD with acute exacerbation ([place]) Active Problems:   Acute respiratory failure with hypoxia ([facility])   GERD without esophagitis   Hypertension   Anxiety   Chronic pain   Hyperglycemia   DM (diabetes mellitus), type 2 ([facility])   Respiratory bronchiolitis associated interstitial lung disease ([place])    Plan: From a pulmonary point of view I think she is okay to go home    LOS: 6 days   Treasa Bradshaw L 08/31/2017, 8:06 AM

## 2017-08-31 NOTE — Discharge Instructions (Signed)

## 2017-08-31 NOTE — Discharge Summary (Signed)
Physician Discharge Summary  [name] VEL:[id number] DOB: Jan 28, 1956 DOA: 08/25/2017  PCP: Jani Gravel, MD  Admit date: 08/25/2017 Discharge date: 08/31/2017  Time spent: 45 minutes  Recommendations for Outpatient Follow-up:  Patient will be discharged to home with home health physical therapy and supplemental oxygen.  Patient will need to follow up with primary care provider within one week of discharge. Follow up with Dr. Luan Pulling, pulmonologist, in 1-2 weeks.  Patient should continue medications as prescribed.  Patient should follow a heart healthy/carb modified diet.   Discharge Diagnoses:  Acute COPD exacerbation New onset diabetes mellitus type 2/hyperglycemia  Essential hypertension Anxiety Chronic pain Hyponatremia GERD  Discharge Condition: stable  Diet recommendation: heart healthy/carb modified  Filed Weights   08/28/17 0500 08/29/17 0500 08/30/17 0500  Weight: 105.4 kg (232 lb 5.8 oz) 104.6 kg (230 lb 9.6 oz) 105.4 kg (232 lb 5.8 oz)    History of present illness:  On 08/25/2017 by Dr. Christia Reading Opyd KOLETTE [name] is a 62 y.o. female with medical history significant for COPD, hypertension, and chronic pain, now presenting to the emergency department for evaluation of cough, dyspnea, and wheezing.  Patient reports that she had been in her usual state of health until days ago when she noted the insidious development of nonproductive cough with shortness of breath.  Over the ensuing days, symptoms have worsened significantly and her cough has become productive of scant thick sputum.  She denies fevers or chills and denies lower extremity swelling or tenderness.  No chest pain.  She now reports dyspnea at rest.  She has been using her home nebulizer unit with very little relief.  Hospital Course:  Acute COPD exacerbation -Unknown etiology -Shortness of breath appears to be slowly improving -CT chest showed extensive groundglass opacities with concern of  respiratory bronchiolitis -Suspect breathing is complicated by anxiety -Influenza PCR negative -Patient was placed on Solu-Medrol, and will be transitioned to prednisone taper  -Pulmonology consulted and appreciated -was placed on brovana, pulmicort, mucinex, claritin, flonase, levaquin -patient completed 7 days of levaquin during hospitalization -discussed with Dr. Luan Pulling, pulm, recommended continuing brovana and pulmicort nebs if covered by insurance, if not, will try symibcort -Discussed with case management - brovana is not covered, however pulmicort is covered by her insurance -patient becomes short of breath with ambulation, will discharge with home oxygen -PT recommended home health   New onset diabetes mellitus type 2/hyperglycemia  -hemoglobin A1c 6.8 -Patient with no history of diabetes, was told she was prediabetic -Suspect hypoglycemic episodes since CBGs worsened with Solu-Medrol -will discharge with oral prednisone taper over the next 2 weeks -will discharge patient with metfomin 500mg  BID -follow up with PCP in one week to discuss diabetes management  Essential hypertension -Blood pressures been stable  -patient was on HCTZ however this was discontinued due to worsening hyponatremia -Continue Lasix, lisinopril -Follow-up with PCP to discuss management  Anxiety -We will discharge patient with a limited number of Ativan -Discussed with patient that she needs to follow-up with PCP- she should be on a long acting medication such as Lexapro or Celexa  Chronic pain -Continue home regimen of Relafen and Ultracet  Hyponatremia -Sodium levels appear to be stable, would repeat BMP in 1 week  GERD -Continue PPI  Procedures:None  Consultations: Pulmonology, Dr. Luan Pulling  Discharge Exam: Vitals:   08/31/17 0928 08/31/17 0930  BP:    Pulse:    Resp:    Temp:    SpO2: 98% 99%   Patient seen  and examined on day of discharge.  Feels her breathing has improved but not  back to baseline.  Patient feels that her anxiety is worsening her breathing.  Denies current chest pain, abdominal pain, nausea or vomiting, diarrhea or constipation.  States she feels she could go homee.   General: Well developed, well nourished, NAD, appears stated age  HEENT: NCAT, mucous membranes moist.  Neck: Supple  Cardiovascular: S1 S2 auscultated, no rubs, murmurs or gallops. Regular rate and rhythm.  Respiratory: Diminished breath sounds, however, clear. No wheezing noted  Abdomen: Soft, obese, nontender, nondistended, + bowel sounds  Extremities: warm dry without cyanosis clubbing or edema  Neuro: AAOx3, nonfocal  Psych: anxious, however appropriate  Discharge Instructions Discharge Instructions    DME Nebulizer machine   Complete by:  As directed    Patient needs a nebulizer to treat with the following condition:  COPD (chronic obstructive pulmonary disease) ([place])   DME Nebulizer/meds   Complete by:  As directed    Patient needs a nebulizer to treat with the following condition:  COPD (chronic obstructive pulmonary disease) ([place])   Discharge instructions   Complete by:  As directed    Patient will be discharged to home with home health physical therapy and supplemental oxygen.  Patient will need to follow up with primary care provider within one week of discharge. Follow up with Dr. Luan Pulling, pulmonologist, in 1-2 weeks.  Patient should continue medications as prescribed.  Patient should follow a heart healthy/carb modified diet.     Allergies as of 08/31/2017      Reactions   Penicillins Other (See Comments)   Patient is unsure if she allergic to penicillin or septra. Patient states one or another caused "rib pain with a little breathing problem". Has patient had a PCN reaction causing immediate rash, facial/tongue/throat swelling, SOB or lightheadedness with hypotension: YES Has patient had a PCN reaction causing severe rash involving mucus membranes or skin  necrosis: NO Has patient had a PCN reaction that required hospitalization: NO Has patient had a PCN reaction occurring within the last 10 years: NO   Septra [sulfamethoxazole-trimethoprim] Other (See Comments)   Patient is unsure if she allergic to septra or penicillin. Patient states one or another caused "rib pain with a little breathing problem".      Medication List    STOP taking these medications   hydrochlorothiazide 25 MG tablet Commonly known as:  HYDRODIURIL   promethazine 25 MG tablet Commonly known as:  PHENERGAN     TAKE these medications   albuterol (2.5 MG/3ML) 0.083% nebulizer solution Commonly known as:  PROVENTIL Take 2.5 mg by nebulization every 6 (six) hours as needed for wheezing or shortness of breath.   albuterol 108 (90 Base) MCG/ACT inhaler Commonly known as:  PROVENTIL HFA;VENTOLIN HFA Inhale 2 puffs into the lungs every 6 (six) hours as needed for wheezing or shortness of breath.   albuterol-ipratropium 18-103 MCG/ACT inhaler Commonly known as:  COMBIVENT Inhale into the lungs every 4 (four) hours.   baclofen 10 MG tablet Commonly known as:  LIORESAL Take 10 mg by mouth 3 (three) times daily.   budesonide 0.5 MG/2ML nebulizer solution Commonly known as:  PULMICORT Take 2 mLs (0.5 mg total) by nebulization 2 (two) times daily.   conjugated estrogens vaginal cream Commonly known as:  PREMARIN Place 1 Applicatorful vaginally daily. 1 gram daily   cycloSPORINE 0.05 % ophthalmic emulsion Commonly known as:  RESTASIS Place 1 drop into both eyes 2 (two)  times daily.   fluticasone 50 MCG/ACT nasal spray Commonly known as:  FLONASE Place 1 spray into both nostrils 2 (two) times daily as needed for allergies or rhinitis.   furosemide 40 MG tablet Commonly known as:  LASIX Take 1 tablet (40 mg total) by mouth daily.   guaiFENesin 600 MG 12 hr tablet Commonly known as:  MUCINEX Take 2 tablets (1,200 mg total) by mouth 2 (two) times daily.     HYDROcodone-homatropine 5-1.5 MG/5ML syrup Commonly known as:  HYCODAN Take 5 mLs by mouth every 6 (six) hours as needed for cough.   lidocaine 2 % solution Commonly known as:  XYLOCAINE TAKE 15 MLS BY MOUTH EVERY 6 HOURS AS NEEDED.   lisinopril 20 MG tablet Commonly known as:  PRINIVIL,ZESTRIL TAKE ONE TABLET BY MOUTH DAILY.   loratadine 10 MG tablet Commonly known as:  CLARITIN Take 1 tablet (10 mg total) by mouth daily. Start taking on:  09/01/2017   LORazepam 0.5 MG tablet Commonly known as:  ATIVAN Take 1 tablet (0.5 mg total) by mouth every 8 (eight) hours as needed for anxiety.   lovastatin 40 MG tablet Commonly known as:  MEVACOR Take 40 mg by mouth at bedtime.   metFORMIN 500 MG tablet Commonly known as:  GLUCOPHAGE Take 1 tablet (500 mg total) by mouth 2 (two) times daily with a meal.   nabumetone 500 MG tablet Commonly known as:  RELAFEN Take 1 tablet (500 mg total) by mouth 2 (two) times daily.   nicotine 21 mg/24hr patch Commonly known as:  NICODERM CQ - dosed in mg/24 hours Place 1 patch (21 mg total) onto the skin daily. Start taking on:  09/01/2017   nitroGLYCERIN 0.4 MG SL tablet Commonly known as:  NITROSTAT DISSOLVE 1 TABLET UNDER THE TONGUE EVERY 5 MINUTES IF NEEDED FOR CHEST PAIN. MAX 3 DOSES THEN CALL 911.   pantoprazole 40 MG tablet Commonly known as:  PROTONIX Take 1 tablet (40 mg total) by mouth daily at 6 (six) AM. Start taking on:  09/01/2017   predniSONE 10 MG tablet Commonly known as:  DELTASONE Take 6 tabs x 2 days, then 5 tabs x 2 days, then 4 tabs x 2 days, then 3 tabs x 2 days, then 1 tab x 2 days. Start taking on:  09/01/2017   rOPINIRole 1 MG tablet Commonly known as:  REQUIP Take 1 tablet by mouth at bedtime.   traMADol-acetaminophen 37.5-325 MG tablet Commonly known as:  ULTRACET Take 1 tablet by mouth every 6 (six) hours as needed.            Durable Medical Equipment  (From admission, onward)        Start      Ordered   08/31/17 0000  DME Nebulizer machine    Question:  Patient needs a nebulizer to treat with the following condition  Answer:  COPD (chronic obstructive pulmonary disease) ([place])   08/31/17 1153   08/31/17 0000  DME Nebulizer/meds    Question:  Patient needs a nebulizer to treat with the following condition  Answer:  COPD (chronic obstructive pulmonary disease) ([place])   08/31/17 1153   08/29/17 1507  For home use only DME oxygen  Once    Question Answer Comment  Mode or (Route) Nasal cannula   Liters per Minute 2   Frequency Continuous (stationary and portable oxygen unit needed)   Oxygen conserving device Yes   Oxygen delivery system Gas      08/29/17 1506  Unscheduled  DME Oxygen  Once    Question Answer Comment  Mode or (Route) Nasal cannula   Liters per Minute 2   Oxygen delivery system Gas      08/31/17 1153     Allergies  Allergen Reactions  . Penicillins Other (See Comments)    Patient is unsure if she allergic to penicillin or septra. Patient states one or another caused "rib pain with a little breathing problem". Has patient had a PCN reaction causing immediate rash, facial/tongue/throat swelling, SOB or lightheadedness with hypotension: YES Has patient had a PCN reaction causing severe rash involving mucus membranes or skin necrosis: NO Has patient had a PCN reaction that required hospitalization: NO Has patient had a PCN reaction occurring within the last 10 years: NO  . Septra [Sulfamethoxazole-Trimethoprim] Other (See Comments)    Patient is unsure if she allergic to septra or penicillin. Patient states one or another caused "rib pain with a little breathing problem".   Follow-up Information    Jani Gravel, MD. Schedule an appointment as soon as possible for a visit in 1 week(s).   Specialty:  Internal Medicine Why:  Hospital follow up Contact information: [place] [address] [phone]        Rebekah Du, MD.  Schedule an appointment as soon as possible for a visit in 1 week(s).   Specialty:  Pulmonary Disease Why:  Hospital follow up Contact information: [place]  [place] [address] [phone]            The results of significant diagnostics from this hospitalization (including imaging, microbiology, ancillary and laboratory) are listed below for reference.    Significant Diagnostic Studies: Dg Chest 2 View  Result Date: 08/18/2017 CLINICAL DATA:  Cough and weakness. History of COPD, current smoker, previous episodes of pneumonia. EXAM: CHEST  2 VIEW COMPARISON:  Chest x-ray of December 12, 2016 FINDINGS: The lungs are mildly hyperinflated with hemidiaphragm flattening. The interstitial markings are coarse though stable. There is no alveolar infiltrate or pleural effusion. The heart and pulmonary vascularity are normal. There is gentle dextrocurvature centered in the midthoracic spine. IMPRESSION: COPD. No pneumonia, CHF, nor other acute cardiopulmonary abnormality. Electronically Signed   By: David  Martinique M.D.   On: 08/18/2017 13:59   Ct Chest W Contrast  Result Date: 08/27/2017 CLINICAL DATA:  Inpatient. Dyspnea. Hypotension. Current smoker. COPD. EXAM: CT CHEST WITH CONTRAST TECHNIQUE: Multidetector CT imaging of the chest was performed during intravenous contrast administration. CONTRAST:  24mL ISOVUE-300 IOPAMIDOL (ISOVUE-300) INJECTION 61% COMPARISON:  08/25/2017 chest radiograph.  10/12/2013 chest CT. FINDINGS: Cardiovascular: Normal heart size. No significant pericardial fluid/thickening. Three-vessel coronary atherosclerosis. Atherosclerotic nonaneurysmal thoracic aorta. Normal caliber pulmonary arteries. No central pulmonary emboli. Mediastinum/Nodes: No discrete thyroid nodules. Unremarkable esophagus. No pathologically enlarged axillary, mediastinal or hilar lymph nodes. Lungs/Pleura: No pneumothorax. No pleural effusion. No acute consolidative airspace disease,  lung masses or significant pulmonary nodules. There is prominent patchy ground-glass opacity throughout both lungs, with relative sparing at the lung bases. No significant regions traction bronchiectasis, architectural distortion or frank honeycombing. Upper abdomen: No acute abnormality. Musculoskeletal: No aggressive appearing focal osseous lesions. Mild thoracic spondylosis. IMPRESSION: 1. Prominent nonspecific patchy ground-glass opacity throughout both lungs with relative sparing at the lung bases. Smoking related interstitial lung disease (respiratory bronchiolitis-interstitial lung disease (RB-ILD)) is a consideration in this current smoker (per EPIC records). Other considerations include acute interstitial pneumonia (AIP), diffuse alveolar hemorrhage, drug toxicity or atypical infection (particularly  if the patient is immunocompromised). 2. Three-vessel coronary atherosclerosis. Aortic Atherosclerosis (ICD10-I70.0). Electronically Signed   By: Ilona Sorrel M.D.   On: 08/27/2017 11:45   Dg Chest Portable 1 View  Result Date: 08/25/2017 CLINICAL DATA:  62 y/o F; cough, mild chest pain, and shortness of breath for 9 days. History of COPD, hypertension, pneumonia, emphysema, current smoker. EXAM: PORTABLE CHEST 1 VIEW COMPARISON:  08/18/2017 chest radiograph FINDINGS: Stable heart size and mediastinal contours are within normal limits. Emphysema. Increased reticular opacities of the lungs. No focal consolidation, effusion, or pneumothorax. The visualized skeletal structures are unremarkable. IMPRESSION: Increased reticular opacities, possibly represent interstitial edema or atypical pneumonitis. COPD. Electronically Signed   By: Kristine Garbe M.D.   On: 08/25/2017 20:18    Microbiology: No results found for this or any previous visit (from the past 240 hour(s)).   Labs: Basic Metabolic Panel: Recent Labs  Lab 08/27/17 0603 08/28/17 0557 08/29/17 0614 08/30/17 0550 08/31/17 0549  NA  128* 132* 130* 128* 129*  K 4.2 4.2 4.4 4.3 4.2  CL 91* 94* 93* 91* 90*  CO2 27 26 27 28 30   GLUCOSE 314* 266* 346* 316* 291*  BUN 28* 25* 23* 25* 26*  CREATININE 0.70 0.60 0.71 0.66 0.76  CALCIUM 9.9 10.2 10.2 10.0 10.1   Liver Function Tests: Recent Labs  Lab 08/25/17 1959  AST 20  ALT 19  ALKPHOS 75  BILITOT 0.2*  PROT 7.0  ALBUMIN 3.1*   No results for input(s): LIPASE, AMYLASE in the last 168 hours. No results for input(s): AMMONIA in the last 168 hours. CBC: Recent Labs  Lab 08/25/17 1959 08/26/17 0511 08/27/17 0603 08/28/17 0557 08/29/17 0614 08/30/17 0550 08/31/17 0549  WBC 14.5* 11.9* 20.7* 18.3* 14.8* 15.0* 15.8*  NEUTROABS 10.8* 10.7*  --   --   --   --   --   HGB 13.2 12.3 12.4 13.0 12.7 12.3 12.8  HCT 39.0 36.8 37.6 38.7 37.3 37.5 39.0  MCV 94.0 95.1 94.7 94.6 94.2 95.2 95.4  PLT 295 272 331 377 365 388 431*   Cardiac Enzymes: Recent Labs  Lab 08/25/17 1959  TROPONINI <0.03   BNP: BNP (last 3 results) Recent Labs    08/25/17 1959  BNP 51.0    ProBNP (last 3 results) No results for input(s): PROBNP in the last 8760 hours.  CBG: Recent Labs  Lab 08/30/17 0748 08/30/17 1155 08/30/17 1624 08/30/17 2152 08/31/17 0753  GLUCAP 280* 349* 80 300* 260*       Signed:  Abrahim Sargent  [facility] 08/31/2017, 11:54 AM

## 2017-08-31 NOTE — Care Management Note (Signed)
Case Management Note  Patient Details  Name: [name] MRN: [id number] Date of Birth: 31-Oct-1955     Expected Discharge Date:       08/31/2017           Expected Discharge Plan:  [place]  In-House Referral:  Clinical Social Work  Discharge planning Services  CM Consult  Post Acute Care Choice:  Durable Medical Equipment, Home Health Choice offered to:  Patient  DME Arranged:  Oxygen DME Agency:  [place]:  PT [facility] Agency:  [place]  Status of Service:  Completed, signed off  If discussed at [place] of Stay Meetings, dates discussed:    Additional Comments: Patient discharging home today. She will have [place] PT provided by [facility]. She will have oxygen provided by [facility]. Juliann Pulse of [facility] has orders and will deliver port tank to room prior to DC.  Patient has Medicaid and PCP. She has been provided a list for a new PCP is she would like to obtain one.  She is not eligible for any medication assistance as she has Medicaid.  No other CM needs.  Khamarion Bjelland, Chauncey Reading, RN 08/31/2017, 11:20 AM

## 2017-08-31 NOTE — Progress Notes (Signed)
Inpatient Diabetes Program Recommendations  AACE/ADA: New Consensus Statement on Inpatient Glycemic Control (2015)  Target Ranges:  Prepandial:   less than 140 mg/dL      Peak postprandial:   less than 180 mg/dL (1-2 hours)      Critically ill patients:  140 - 180 mg/dL   Lab Results  Component Value Date   GLUCAP 260 (H) 08/31/2017   HGBA1C 6.8 (H) 08/26/2017    Spoke with patient about new diabetes diagnosis.  Discussed A1C results (6.8%) and explained what an A1C is. Discussed basic pathophysiology of DM Type 2, basic home care. Reviewed glucose and A1C goals. Discussed with patient to follow up with her PCP every 6 months. Patient reports her PCP told her she was "boarderline diabetic." Discussed impact of nutrition, exercise, stress, sickness, and medications on diabetes control. Told patient to reviewed Living Well with diabetes booklet. Discussed Carbohydrates, portion sizes, and carbohydrate free beverage options in detail.  Told patient to go to outpatient classes on DM management and nutrition. Patient verbalized understanding of information discussed and has no further questions at this time related to diabetes. RNs to provide ongoing basic DM education at bedside with this patient.   Thanks, Tama Headings RN, MSN, BC-ADM, [facility] Inpatient Diabetes Coordinator Team Pager [phone] (8a-5p)

## 2017-09-01 LAB — GLUCOSE, CAPILLARY: Glucose-Capillary: 196 mg/dL — ABNORMAL HIGH (ref 65–99)

## 2017-09-07 ENCOUNTER — Other Ambulatory Visit (HOSPITAL_COMMUNITY): Payer: Self-pay | Admitting: Internal Medicine

## 2017-09-07 DIAGNOSIS — Z1231 Encounter for screening mammogram for malignant neoplasm of breast: Secondary | ICD-10-CM

## 2017-09-09 ENCOUNTER — Ambulatory Visit (HOSPITAL_COMMUNITY): Payer: Medicaid Other

## 2017-10-03 ENCOUNTER — Other Ambulatory Visit: Payer: Self-pay | Admitting: Orthopedic Surgery

## 2017-10-03 NOTE — Telephone Encounter (Signed)
Tramadol-Acetaminophen  37.5/325 MG  Qty 28 Tablets  Take 1 tablet by mouth every 6 (six) hours as needed.    PATIENT USES [facility]

## 2017-10-04 MED ORDER — TRAMADOL-ACETAMINOPHEN 37.5-325 MG PO TABS: 1.0000 | ORAL_TABLET | Freq: Four times a day (QID) | ORAL | 0 refills | Status: DC | PRN

## 2017-10-06 ENCOUNTER — Other Ambulatory Visit: Payer: Self-pay | Admitting: Obstetrics & Gynecology

## 2017-10-07 ENCOUNTER — Other Ambulatory Visit: Payer: Self-pay | Admitting: Orthopedic Surgery

## 2017-10-20 ENCOUNTER — Ambulatory Visit: Payer: Self-pay | Admitting: Orthopedic Surgery

## 2017-11-07 ENCOUNTER — Ambulatory Visit: Payer: Medicaid Other | Admitting: Orthopedic Surgery

## 2017-11-07 ENCOUNTER — Other Ambulatory Visit: Payer: Self-pay | Admitting: Obstetrics & Gynecology

## 2017-11-07 ENCOUNTER — Encounter: Payer: Self-pay | Admitting: Orthopedic Surgery

## 2017-11-07 VITALS — BP 129/87 | HR 71 | Ht 64.0 in | Wt 220.0 lb

## 2017-11-07 DIAGNOSIS — M1711 Unilateral primary osteoarthritis, right knee: Secondary | ICD-10-CM

## 2017-11-07 MED ORDER — TRAMADOL-ACETAMINOPHEN 37.5-325 MG PO TABS: 1.0000 | ORAL_TABLET | Freq: Four times a day (QID) | ORAL | 0 refills | Status: DC | PRN

## 2017-11-07 NOTE — Progress Notes (Signed)
Progress Note   Patient ID: [name], female   DOB: 1955/08/18, 62 y.o.   MRN: [id number]  Chief Complaint  Patient presents with  . Follow-up    Recheck on right knee.     Medical decision-making Encounter Diagnosis  Name Primary?  . Primary osteoarthritis of right knee Yes      Meds ordered this encounter  Medications  . traMADol-acetaminophen (ULTRACET) 37.5-325 MG tablet    Sig: Take 1 tablet by mouth every 6 (six) hours as needed.    Dispense:  28 tablet    Refill:  0     PLAN:  Inject right knee Refill tramadol X-ray 6 months  Procedure note right knee injection verbal consent was obtained to inject right knee joint  Timeout was completed to confirm the site of injection  The medications used were 40 mg of Depo-Medrol and 1% lidocaine 3 cc  Anesthesia was provided by ethyl chloride and the skin was prepped with alcohol.  After cleaning the skin with alcohol a 20-gauge needle was used to inject the right knee joint. There were no complications. A sterile bandage was applied.   Chief Complaint  Patient presents with  . Follow-up    Recheck on right knee.    62 year old female with osteoarthritis both knees comes in complaining of increasing pain right knee she is on tramadol Nexium and prednisone.    Review of Systems  Respiratory: Positive for shortness of breath.    No outpatient medications have been marked as taking for the 11/07/17 encounter (Office Visit) with Carole Civil, MD.    Allergies  Allergen Reactions  . Penicillins Other (See Comments)    Patient is unsure if she allergic to penicillin or septra. Patient states one or another caused "rib pain with a little breathing problem". Has patient had a PCN reaction causing immediate rash, facial/tongue/throat swelling, SOB or lightheadedness with hypotension: YES Has patient had a PCN reaction causing severe rash involving mucus membranes or skin necrosis: NO Has patient had  a PCN reaction that required hospitalization: NO Has patient had a PCN reaction occurring within the last 10 years: NO  . Septra [Sulfamethoxazole-Trimethoprim] Other (See Comments)    Patient is unsure if she allergic to septra or penicillin. Patient states one or another caused "rib pain with a little breathing problem".     BP 129/87   Pulse 71   Ht 5\' 4"  (1.626 m)   Wt 220 lb (99.8 kg)   BMI 37.76 kg/m   Physical Exam  Constitutional: She is oriented to person, place, and time. She appears well-developed and well-nourished.  Endomorphic  Musculoskeletal:       Legs: Neurological: She is alert and oriented to person, place, and time.  Psychiatric: She has a normal mood and affect. Judgment normal.  Vitals reviewed.      Arther Abbott, MD 11/07/2017 10:43 AM

## 2018-01-02 ENCOUNTER — Other Ambulatory Visit (HOSPITAL_COMMUNITY): Payer: Self-pay | Admitting: Adult Health

## 2018-01-02 ENCOUNTER — Ambulatory Visit (HOSPITAL_COMMUNITY)
Admission: RE | Admit: 2018-01-02 | Discharge: 2018-01-02 | Disposition: A | Discharge status: Home / Self Care | Payer: Medicaid Other | Source: Ambulatory Visit | Attending: Adult Health | Admitting: Adult Health

## 2018-01-02 DIAGNOSIS — M79672 Pain in left foot: Secondary | ICD-10-CM | POA: Insufficient documentation

## 2018-01-02 DIAGNOSIS — M67874 Other specified disorders of tendon, left ankle and foot: Secondary | ICD-10-CM | POA: Diagnosis not present

## 2018-01-02 DIAGNOSIS — M19072 Primary osteoarthritis, left ankle and foot: Secondary | ICD-10-CM | POA: Insufficient documentation

## 2018-03-28 ENCOUNTER — Ambulatory Visit: Payer: Medicaid Other | Admitting: Sports Medicine

## 2018-04-10 ENCOUNTER — Encounter (HOSPITAL_COMMUNITY): Payer: Self-pay

## 2018-04-10 NOTE — Therapy (Signed)
[place], [place], [address] Phone: [phone]   Fax:  [phone]  Patient Details  Name: [name] MRN: [id number] Date of Birth: 08-15-1955 Referring Provider:  No ref. provider found  Encounter Date: 04/10/2018   PHYSICAL THERAPY DISCHARGE SUMMARY  Visits from Start of Care: 3  Current functional level related to goals / functional outcomes: Patient is being discharged from this episode of care as she has not returned since last visit on 07/26/17. She had requested to be placed on hold and that she would call back to continue with therapy but never reached out to reschedule appointments with our office.   Remaining deficits: See last note for details. Patient never formally re-assessed due to low frequency/short episode of attendance to therapy.   Education / Equipment: She had been educated on exercises throughout her therapy and on attendance policy.    Plan: Patient agrees to discharge.  Patient goals were not met. Patient is being discharged due to not returning since the last visit.  ?????     Kipp Brood, PT, DPT Physical Therapist with [facility]  04/10/2018 8:58 AM    [place], [place], [address] Phone: [phone]   Fax:  [phone]

## 2018-04-18 ENCOUNTER — Ambulatory Visit: Payer: Medicaid Other | Admitting: Sports Medicine

## 2018-05-02 ENCOUNTER — Ambulatory Visit: Payer: Medicaid Other | Admitting: Sports Medicine

## 2018-05-02 ENCOUNTER — Encounter: Payer: Self-pay | Admitting: Sports Medicine

## 2018-05-02 ENCOUNTER — Ambulatory Visit: Payer: Medicaid Other

## 2018-05-02 ENCOUNTER — Ambulatory Visit (INDEPENDENT_AMBULATORY_CARE_PROVIDER_SITE_OTHER): Payer: Medicaid Other

## 2018-05-02 DIAGNOSIS — M7750 Other enthesopathy of unspecified foot: Secondary | ICD-10-CM

## 2018-05-02 DIAGNOSIS — G8929 Other chronic pain: Secondary | ICD-10-CM

## 2018-05-02 DIAGNOSIS — M1990 Unspecified osteoarthritis, unspecified site: Secondary | ICD-10-CM

## 2018-05-02 DIAGNOSIS — M21619 Bunion of unspecified foot: Secondary | ICD-10-CM

## 2018-05-02 DIAGNOSIS — M216X2 Other acquired deformities of left foot: Secondary | ICD-10-CM | POA: Diagnosis not present

## 2018-05-02 DIAGNOSIS — M216X1 Other acquired deformities of right foot: Secondary | ICD-10-CM

## 2018-05-02 DIAGNOSIS — M7752 Other enthesopathy of left foot: Secondary | ICD-10-CM | POA: Diagnosis not present

## 2018-05-02 DIAGNOSIS — M792 Neuralgia and neuritis, unspecified: Secondary | ICD-10-CM

## 2018-05-02 MED ORDER — MELOXICAM 15 MG PO TABS: 15.0000 mg | ORAL_TABLET | Freq: Every day | ORAL | 0 refills | Status: DC

## 2018-05-02 MED ORDER — GABAPENTIN 300 MG PO CAPS: 300.0000 mg | ORAL_CAPSULE | Freq: Every day | ORAL | 3 refills | Status: DC

## 2018-05-02 NOTE — Progress Notes (Signed)
Subjective: [name] is a 62 y.o. female patient who presents to office for evaluation of L>R foot pain. Patient complains of progressive pain especially over the last year in the L>R foot and ankle that radiates to the knee and pain to right bunion and 5th toe at the met level. Admits history of neuropathy and knee and cervical spine pain. Patient denies any other pedal complaints. Denies injury/trip/fall/sprain/any causative factors.   Review of Systems  Musculoskeletal: Positive for back pain, joint pain and neck pain.  All other systems reviewed and are negative.    Patient Active Problem List   Diagnosis Date Noted  . DM (diabetes mellitus), type 2 ([place]) 08/30/2017  . Respiratory bronchiolitis associated interstitial lung disease ([place]) 08/30/2017  . Hyperglycemia 08/26/2017  . Chronic pain 08/25/2017  . COPD with acute exacerbation ([place]) 08/25/2017  . Alcohol abuse 08/01/2015  . Anorgasmia of female 01/01/2015  . Low grade squamous intraepithelial lesion (LGSIL) on Papanicolaou smear of cervix 12/04/2014  . Abdominal pain, chronic, epigastric 11/20/2014  . Dysphagia, pharyngoesophageal phase   . Hx of colonic polyps   . COPD exacerbation ([place]) 07/05/2014  . GERD without esophagitis 07/05/2014  . Hypertension 07/05/2014  . Bipolar 1 disorder ([place]) 07/05/2014  . Anxiety 07/05/2014  . Polysubstance abuse ([place]) 07/05/2014  . Obesity (BMI 30-39.9) 07/05/2014  . Encephalopathy, metabolic 33/29/5188  . Personal history of colonic polyps 04/01/2014  . Mild dysplasia of cervix 11/13/2013  . Acute respiratory failure with hypoxia ([place]) 07/08/2013  . Benzodiazepine dependence ([place]) 08/26/2012  . COPD (chronic obstructive pulmonary disease) ([place]) 08/25/2012  . Panic disorder 08/25/2012  . Tobacco use disorder 08/25/2012  . Hyperlipidemia 08/25/2012    Current Outpatient Medications on File Prior to Visit  Medication Sig Dispense Refill  . ondansetron (ZOFRAN) 4 MG  tablet Take by mouth.    . oxyCODONE-acetaminophen (PERCOCET/ROXICET) 5-325 MG tablet Take by mouth.    Marland Kitchen albuterol (PROVENTIL HFA;VENTOLIN HFA) 108 (90 Base) MCG/ACT inhaler Inhale 2 puffs into the lungs every 6 (six) hours as needed for wheezing or shortness of breath.    Marland Kitchen albuterol (PROVENTIL) (2.5 MG/3ML) 0.083% nebulizer solution Take 2.5 mg by nebulization every 6 (six) hours as needed for wheezing or shortness of breath.    Marland Kitchen albuterol-ipratropium (COMBIVENT) 18-103 MCG/ACT inhaler Inhale into the lungs every 4 (four) hours.    . baclofen (LIORESAL) 10 MG tablet Take 10 mg by mouth 3 (three) times daily.    . budesonide (PULMICORT) 0.5 MG/2ML nebulizer solution Take 2 mLs (0.5 mg total) by nebulization 2 (two) times daily. 120 mL 0  . budesonide-formoterol (SYMBICORT) 160-4.5 MCG/ACT inhaler Inhale into the lungs.    . cycloSPORINE (RESTASIS) 0.05 % ophthalmic emulsion Place 1 drop into both eyes 2 (two) times daily.    . fluticasone (FLONASE) 50 MCG/ACT nasal spray Place 1 spray into both nostrils 2 (two) times daily as needed for allergies or rhinitis. 16 g 6  . furosemide (LASIX) 40 MG tablet Take 1 tablet (40 mg total) by mouth daily. 30 tablet 0  . guaiFENesin (MUCINEX) 600 MG 12 hr tablet Take 2 tablets (1,200 mg total) by mouth 2 (two) times daily. 14 tablet 0  . HYDROcodone-homatropine (HYCODAN) 5-1.5 MG/5ML syrup Take 5 mLs by mouth every 6 (six) hours as needed for cough. 120 mL 0  . lidocaine (XYLOCAINE) 2 % solution TAKE 15 MLS BY MOUTH EVERY 6 HOURS AS NEEDED. (Patient not taking: Reported on 08/26/2017) 200 mL 0  .  lisinopril (PRINIVIL,ZESTRIL) 20 MG tablet TAKE ONE TABLET BY MOUTH DAILY. 30 tablet 0  . loratadine (CLARITIN) 10 MG tablet Take 1 tablet (10 mg total) by mouth daily. 30 tablet 0  . LORazepam (ATIVAN) 0.5 MG tablet Take 1 tablet (0.5 mg total) by mouth every 8 (eight) hours as needed for anxiety. 30 tablet 0  . lovastatin (MEVACOR) 40 MG tablet Take 40 mg by mouth  at bedtime.    . metFORMIN (GLUCOPHAGE) 500 MG tablet Take 1 tablet (500 mg total) by mouth 2 (two) times daily with a meal. 60 tablet 0  . nabumetone (RELAFEN) 500 MG tablet Take 1 tablet (500 mg total) by mouth 2 (two) times daily. 60 tablet 5  . nicotine (NICODERM CQ - DOSED IN MG/24 HOURS) 21 mg/24hr patch Place 1 patch (21 mg total) onto the skin daily. 28 patch 0  . nitroGLYCERIN (NITROSTAT) 0.4 MG SL tablet DISSOLVE 1 TABLET UNDER THE TONGUE EVERY 5 MINUTES IF NEEDED FOR CHEST PAIN. MAX 3 DOSES THEN CALL 911. 25 tablet 3  . pantoprazole (PROTONIX) 40 MG tablet Take 1 tablet (40 mg total) by mouth daily at 6 (six) AM. 30 tablet 0  . predniSONE (DELTASONE) 10 MG tablet Take 6 tabs x 2 days, then 5 tabs x 2 days, then 4 tabs x 2 days, then 3 tabs x 2 days, then 1 tab x 2 days. 30 tablet 0  . PREMARIN vaginal cream INSERT 1 APPLICATORFUL (1 GRAM) VAGINALLY DAILY. 30 g 11  . rOPINIRole (REQUIP) 1 MG tablet Take 1 tablet by mouth at bedtime.     . sucralfate (CARAFATE) 1 GM/10ML suspension Take by mouth.    . traMADol-acetaminophen (ULTRACET) 37.5-325 MG tablet Take 1 tablet by mouth every 6 (six) hours as needed. 28 tablet 0   No current facility-administered medications on file prior to visit.     Allergies  Allergen Reactions  . Penicillins Other (See Comments)    Patient is unsure if she allergic to penicillin or septra. Patient states one or another caused "rib pain with a little breathing problem". Has patient had a PCN reaction causing immediate rash, facial/tongue/throat swelling, SOB or lightheadedness with hypotension: YES Has patient had a PCN reaction causing severe rash involving mucus membranes or skin necrosis: NO Has patient had a PCN reaction that required hospitalization: NO Has patient had a PCN reaction occurring within the last 10 years: NO  . Septra [Sulfamethoxazole-Trimethoprim] Other (See Comments)    Patient is unsure if she allergic to septra or penicillin. Patient  states one or another caused "rib pain with a little breathing problem".    Objective:  General: Alert and oriented x3 in no acute distress  Dermatology: No open lesions bilateral lower extremities, no webspace macerations, no ecchymosis bilateral, all nails x 10 are well manicured.  Vascular: Dorsalis Pedis and Posterior Tibial pedal pulses palpable, Capillary Fill Time 5 seconds,(-) pedal hair growth bilateral, minimal edema bilateral lower extremities, Temperature gradient within normal limits.  Neurology: Gross sensation intact via light touch bilateral, vibratory sensation diminished left greater than right.  Subjective tingling and sharp shooting pain on the left foot and ankle that radiates to the knee.   Musculoskeletal: Mild tenderness with palpation at right first metatarsophalangeal joint with limited range of motion and dorsal bone spurs significant of hallux rigidus with arthritis and pain to tailor's bunion on right foot foot.  No pain with calf palpation.  Xrays  Right foot and left foot   Impression:  Normal osseous mineralization there is significant diffuse arthritis especially at the right first metatarsophalangeal joint with dorsal spurring noted mild increase in intermetatarsal angle especially at the fourth and fifth metatarsal on the right foot supportive of tailor's bunion deformity, no other acute findings.  Assessment and Plan: Problem List Items Addressed This Visit      Other   Chronic pain   Relevant Medications   oxyCODONE-acetaminophen (PERCOCET/ROXICET) 5-325 MG tablet   meloxicam (MOBIC) 15 MG tablet   gabapentin (NEURONTIN) 300 MG capsule    Other Visit Diagnoses    Tendinitis of ankle or foot    -  Primary   Relevant Orders   DG Foot Complete Left (Completed)   Bunion       Relevant Orders   DG Foot Complete Right (Completed)   Neuritis       Relevant Medications   gabapentin (NEURONTIN) 300 MG capsule   Arthritis       Relevant Medications    oxyCODONE-acetaminophen (PERCOCET/ROXICET) 5-325 MG tablet   meloxicam (MOBIC) 15 MG tablet       -Complete examination performed -Xrays reviewed -Discussed treatement options likely nerve pain and arthritis with possible tendinitis from walking this way and compensating due to pain -Rx gabapentin to take 300 mg at bedtime for nerve related pain and prescribed meloxicam to take for arthritic symptoms and advised patient if pain continues may benefit from hallux rigidus surgery on right however at this time due to patient's smoking history and history of borderline diabetes do not recommend surgery at this point until patient exhibits better control of her co-morbidities -Dispensed bunion padding for right foot to use as instructed -Patient to return to office in 6 weeks for medication check or sooner if condition worsens.  Landis Martins, DPM

## 2018-05-10 ENCOUNTER — Ambulatory Visit: Payer: Medicaid Other | Admitting: Orthopedic Surgery

## 2018-06-13 ENCOUNTER — Ambulatory Visit: Payer: Medicaid Other | Admitting: Sports Medicine

## 2018-06-21 ENCOUNTER — Ambulatory Visit: Payer: Self-pay | Admitting: Orthopedic Surgery

## 2018-07-12 ENCOUNTER — Ambulatory Visit: Payer: Medicaid Other | Admitting: Orthopedic Surgery

## 2018-07-18 ENCOUNTER — Ambulatory Visit: Payer: Medicaid Other | Admitting: Sports Medicine

## 2018-07-25 ENCOUNTER — Other Ambulatory Visit (HOSPITAL_COMMUNITY): Payer: Self-pay | Admitting: Family Medicine

## 2018-07-25 ENCOUNTER — Other Ambulatory Visit (HOSPITAL_COMMUNITY): Payer: Self-pay | Admitting: Internal Medicine

## 2018-07-25 DIAGNOSIS — Z780 Asymptomatic menopausal state: Secondary | ICD-10-CM

## 2018-08-16 ENCOUNTER — Ambulatory Visit: Payer: Medicaid Other | Admitting: Orthopedic Surgery

## 2018-08-16 ENCOUNTER — Encounter: Payer: Self-pay | Admitting: Orthopedic Surgery

## 2018-08-16 ENCOUNTER — Ambulatory Visit (INDEPENDENT_AMBULATORY_CARE_PROVIDER_SITE_OTHER): Payer: Medicaid Other

## 2018-08-16 VITALS — BP 105/65 | HR 95 | Ht 64.0 in | Wt 226.0 lb

## 2018-08-16 DIAGNOSIS — M1711 Unilateral primary osteoarthritis, right knee: Secondary | ICD-10-CM

## 2018-08-16 DIAGNOSIS — M1990 Unspecified osteoarthritis, unspecified site: Secondary | ICD-10-CM | POA: Diagnosis not present

## 2018-08-16 DIAGNOSIS — M6740 Ganglion, unspecified site: Secondary | ICD-10-CM

## 2018-08-16 DIAGNOSIS — M170 Bilateral primary osteoarthritis of knee: Secondary | ICD-10-CM

## 2018-08-16 MED ORDER — MELOXICAM 15 MG PO TABS: 15.0000 mg | ORAL_TABLET | Freq: Every day | ORAL | 0 refills | Status: DC

## 2018-08-16 MED ORDER — TRAMADOL-ACETAMINOPHEN 37.5-325 MG PO TABS: 1.0000 | ORAL_TABLET | Freq: Four times a day (QID) | ORAL | 0 refills | Status: DC | PRN

## 2018-08-16 NOTE — Patient Instructions (Signed)
Ganglion Cyst    A ganglion cyst is a non-cancerous, fluid-filled lump that occurs near a joint or tendon. The cyst grows out of a joint or the lining of a tendon. Ganglion cysts most often develop in the hand or wrist, but they can also develop in the shoulder, elbow, hip, knee, ankle, or foot.  Ganglion cysts are ball-shaped or egg-shaped. Their size can range from the size of a pea to larger than a grape. Increased activity may cause the cyst to get bigger because more fluid starts to build up.  What are the causes?  The exact cause of this condition is not known, but it may be related to:   Inflammation or irritation around the joint.   An injury.   Repetitive movements or overuse.   Arthritis.  What increases the risk?  You are more likely to develop this condition if:   You are a woman.   You are 15-40 years old.  What are the signs or symptoms?  The main symptom of this condition is a lump. It most often appears on the hand or wrist. In many cases, there are no other symptoms, but a cyst can sometimes cause:   Tingling.   Pain.   Numbness.   Muscle weakness.   Weak grip.   Less range of motion in a joint.  How is this diagnosed?  Ganglion cysts are usually diagnosed based on a physical exam. Your health care provider will feel the lump and may shine a light next to it. If it is a ganglion cyst, the light will likely shine through it.  Your health care provider may order an X-ray, ultrasound, or MRI to rule out other conditions.  How is this treated?  Ganglion cysts often go away on their own without treatment. If you have pain or other symptoms, treatment may be needed. Treatment is also needed if the ganglion cyst limits your movement or if it gets infected. Treatment may include:   Wearing a brace or splint on your wrist or finger.   Taking anti-inflammatory medicine.   Having fluid drained from the lump with a needle (aspiration).   Getting a steroid injected into the joint.   Having  surgery to remove the ganglion cyst.   Placing a pad on your shoe or wearing shoes that will not rub against the cyst if it is on your foot.  Follow these instructions at home:   Do not press on the ganglion cyst, poke it with a needle, or hit it.   Take over-the-counter and prescription medicines only as told by your health care provider.   If you have a brace or splint:  ? Wear it as told by your health care provider.  ? Remove it as told by your health care provider. Ask if you need to remove it when you take a shower or a bath.   Watch your ganglion cyst for any changes.   Keep all follow-up visits as told by your health care provider. This is important.  Contact a health care provider if:   Your ganglion cyst becomes larger or more painful.   You have pus coming from the lump.   You have weakness or numbness in the affected area.   You have a fever or chills.  Get help right away if:   You have a fever and have any of these in the cyst area:  ? Increased redness.  ? Red streaks.  ? Swelling.  Summary     A ganglion cyst is a non-cancerous, fluid-filled lump that occurs near a joint or tendon.   Ganglion cysts most often develop in the hand or wrist, but they can also develop in the shoulder, elbow, hip, knee, ankle, or foot.   Ganglion cysts often go away on their own without treatment.  This information is not intended to replace advice given to you by your health care provider. Make sure you discuss any questions you have with your health care provider.  Document Released: 06/18/2000 Document Revised: 02/18/2017 Document Reviewed: 02/18/2017  Elsevier Interactive Patient Education  2019 [employer]

## 2018-08-16 NOTE — Progress Notes (Addendum)
NEW Problem // OFFICE VISIT  Chief Complaint  Patient presents with  . Knee Pain    bilateral/ requesting injections     [name] is 63 years old she comes in with several problems today.  Her #1 problem is bilateral knee pain she is considering getting injections in both knees.  She was last seen about 6 months ago.  She complains of bilateral knee pain moderate severity constant worse with exercise walking bending kneeling climbing associated with swelling intermittent stiffness best described as a dull throbbing aching pain.  Left knee gives way right the does not  She has a mass on the dorsal aspect of her left wrist and hand it is been there for about a year or so no history of trauma it is starting to cause a lot of pain and she would like it removed.  She also complains of neck pain and shoulder pain   Review of Systems  Musculoskeletal: Positive for joint pain and neck pain.  Neurological: Negative for tingling, sensory change and focal weakness.  All other systems reviewed and are negative.    Past Medical History:  Diagnosis Date  . Anxiety   . Bipolar 1 disorder ([place])   . COPD (chronic obstructive pulmonary disease) ([place])   . Depression   . Emphysema of lung ([place])   . GERD (gastroesophageal reflux disease)   . Hyperlipidemia   . Hypertension   . Pneumonia 2015  . Vaginal Pap smear, abnormal     Past Surgical History:  Procedure Laterality Date  . CHOLECYSTECTOMY    . COLONOSCOPY  July 2010   Dr. Arnoldo Morale: 3 rectal polyps, not enough tissue for pathologic examination, recommended surveillance in 3 years  . COLONOSCOPY N/A 10/23/2014   RMR: Multiple colonic polyps removed as described above. No endoscopic explaniation for abdominal pain. however. next tcs 10/2019  . ESOPHAGOGASTRODUODENOSCOPY  July 2010   Dr. Arnoldo Morale: gastritis and duodenitis, H.pylori negative  . ESOPHAGOGASTRODUODENOSCOPY N/A 10/23/2014   RMR: Normal EGD. Status post passage of a Maloney  dilator. Today's finding s would not explain abdominal pain  . HERNIA REPAIR     Dr. Arnoldo Morale  . KIDNEY STONE SURGERY    . MALONEY DILATION N/A 10/23/2014   Procedure: Venia Minks DILATION;  Surgeon: Daneil Dolin, MD;  Location: [facility];  Service: Endoscopy;  Laterality: N/A;  . OPEN REDUCTION INTERNAL FIXATION (ORIF) DISTAL RADIAL FRACTURE Right 12/09/2015   Procedure: OPEN REDUCTION INTERNAL FIXATION (ORIF) RIGHT DISTAL RADIUS;  Surgeon: Leanora Cover, MD;  Location: [facility];  Service: Orthopedics;  Laterality: Right;    Family History  Adopted: Yes   Social History   Tobacco Use  . Smoking status: Current Some Day Smoker    Packs/day: 0.50    Years: 42.00    Pack years: 21.00    Types: Cigarettes  . Smokeless tobacco: Never Used  . Tobacco comment: 1/2 pack daily  Substance Use Topics  . Alcohol use: Yes    Alcohol/week: 1.0 standard drinks    Types: 1 Standard drinks or equivalent per week    Comment: quit 11/03/2014. used to drink couple of 40 ounces.  . Drug use: No    Allergies  Allergen Reactions  . Penicillins Other (See Comments)    Patient is unsure if she allergic to penicillin or septra. Patient states one or another caused "rib pain with a little breathing problem". Has patient had a PCN reaction causing immediate rash, facial/tongue/throat swelling, SOB or lightheadedness  with hypotension: YES Has patient had a PCN reaction causing severe rash involving mucus membranes or skin necrosis: NO Has patient had a PCN reaction that required hospitalization: NO Has patient had a PCN reaction occurring within the last 10 years: NO  . Septra [Sulfamethoxazole-Trimethoprim] Other (See Comments)    Patient is unsure if she allergic to septra or penicillin. Patient states one or another caused "rib pain with a little breathing problem".    Current Meds  Medication Sig  . albuterol (PROVENTIL HFA;VENTOLIN HFA) 108 (90 Base) MCG/ACT inhaler Inhale 2 puffs  into the lungs every 6 (six) hours as needed for wheezing or shortness of breath.  Marland Kitchen albuterol (PROVENTIL) (2.5 MG/3ML) 0.083% nebulizer solution Take 2.5 mg by nebulization every 6 (six) hours as needed for wheezing or shortness of breath.  Marland Kitchen albuterol-ipratropium (COMBIVENT) 18-103 MCG/ACT inhaler Inhale into the lungs every 4 (four) hours.  . baclofen (LIORESAL) 10 MG tablet Take 10 mg by mouth 3 (three) times daily.  . budesonide-formoterol (SYMBICORT) 160-4.5 MCG/ACT inhaler Inhale into the lungs.  . cycloSPORINE (RESTASIS) 0.05 % ophthalmic emulsion Place 1 drop into both eyes 2 (two) times daily.  . fluticasone (FLONASE) 50 MCG/ACT nasal spray Place 1 spray into both nostrils 2 (two) times daily as needed for allergies or rhinitis.  . furosemide (LASIX) 40 MG tablet Take 1 tablet (40 mg total) by mouth daily.  Marland Kitchen gabapentin (NEURONTIN) 300 MG capsule Take 1 capsule (300 mg total) by mouth at bedtime.  Marland Kitchen guaiFENesin (MUCINEX) 600 MG 12 hr tablet Take 2 tablets (1,200 mg total) by mouth 2 (two) times daily.  Marland Kitchen HYDROcodone-homatropine (HYCODAN) 5-1.5 MG/5ML syrup Take 5 mLs by mouth every 6 (six) hours as needed for cough.  . lidocaine (XYLOCAINE) 2 % solution TAKE 15 MLS BY MOUTH EVERY 6 HOURS AS NEEDED.  Marland Kitchen lisinopril (PRINIVIL,ZESTRIL) 20 MG tablet TAKE ONE TABLET BY MOUTH DAILY.  Marland Kitchen loratadine (CLARITIN) 10 MG tablet Take 1 tablet (10 mg total) by mouth daily.  Marland Kitchen LORazepam (ATIVAN) 0.5 MG tablet Take 1 tablet (0.5 mg total) by mouth every 8 (eight) hours as needed for anxiety.  . lovastatin (MEVACOR) 40 MG tablet Take 40 mg by mouth at bedtime.  . metFORMIN (GLUCOPHAGE) 500 MG tablet Take 1 tablet (500 mg total) by mouth 2 (two) times daily with a meal.  . nicotine (NICODERM CQ - DOSED IN MG/24 HOURS) 21 mg/24hr patch Place 1 patch (21 mg total) onto the skin daily.  . nitroGLYCERIN (NITROSTAT) 0.4 MG SL tablet DISSOLVE 1 TABLET UNDER THE TONGUE EVERY 5 MINUTES IF NEEDED FOR CHEST PAIN. MAX 3  DOSES THEN CALL 911.  . ondansetron (ZOFRAN) 4 MG tablet Take by mouth.  . pantoprazole (PROTONIX) 40 MG tablet Take 1 tablet (40 mg total) by mouth daily at 6 (six) AM.  . predniSONE (DELTASONE) 10 MG tablet Take 6 tabs x 2 days, then 5 tabs x 2 days, then 4 tabs x 2 days, then 3 tabs x 2 days, then 1 tab x 2 days.  Marland Kitchen PREMARIN vaginal cream INSERT 1 APPLICATORFUL (1 GRAM) VAGINALLY DAILY.  Marland Kitchen rOPINIRole (REQUIP) 1 MG tablet Take 1 tablet by mouth at bedtime.   . sucralfate (CARAFATE) 1 GM/10ML suspension Take by mouth.    BP 105/65   Pulse 95   Ht 5\' 4"  (1.626 m)   Wt 226 lb (102.5 kg)   BMI 38.79 kg/m   Physical Exam The patient has mild to moderate obesity but otherwise well-groomed  normal appearance no gross deformity no nutritional abnormalities Ortho Exam  Gait is heel-to-toe with normal speed without limping  Starting with the right and left upper extremity no gross deformity full range of motion mild pain with range of motion left shoulder.  Both shoulders elbows wrists showed no subluxation dislocation muscle tone and strength are normal with no tremor or atrophy skin is warm dry and intact with no rash ulcerations or erythema pulse and temperature normal lymph nodes in the axilla negative normal sensation throughout both limbs deep tendon reflexes are normal and there are no pathologic reflexes rapid movements of the hands are normal  Left wrist has a small dorsal ganglion firm mobile under the skin (aspiration of was attempted with alcohol skin prep ethyl chloride 18-gauge needle 3 cc syringe I cannot get any fluid out) see below patient will have surgery for painful ganglion cyst dorsum left hand  Right knee medial joint line tenderness no effusion knee alignment looks normal grossly knee flexion arc is 120 degrees knee is stable in all planes muscle strength and tone are normal skin is normal with no atrophy pulse and temperature normal mild edema no lymphadenopathy sensation  normal  Left knee we also find medial joint line tenderness crepitance on range of motion flexion arc of 120 degrees ligaments stable in all planes no atrophy or muscle spasm noted strength in the quadriceps normal skin is intact pulse and temperature normal minimal edema sensation is normal reflexes are good and balance overall is normal MEDICAL DECISION SECTION   Xrays were done at [facility]  Right knee x-ray My independent reading of xrays:  X-ray shows probable neutral tibiofemoral alignment posterior osteophytes, joint space narrowing medially probably 75% peripheral osteophytes laterally with subchondral sclerosis impression moderate to severe osteoarthritis right knee  Encounter Diagnoses  Name Primary?  . Primary osteoarthritis of both knees Yes  . Arthritis   . Ganglion cyst left wrist      PLAN: (Rx., injectx, surgery, frx, mri/ct) 1.attempt asp left dorsal wrist ganglion 2.  Procedure note left knee injection verbal consent was obtained to inject left knee joint  Timeout was completed to confirm the site of injection  The medications used were 40 mg of Depo-Medrol and 1% lidocaine 3 cc  Anesthesia was provided by ethyl chloride and the skin was prepped with alcohol.  After cleaning the skin with alcohol a 20-gauge needle was used to inject the left knee joint. There were no complications. A sterile bandage was applied.   Procedure note right knee injection verbal consent was obtained to inject right knee joint  Timeout was completed to confirm the site of injection  The medications used were 40 mg of Depo-Medrol and 1% lidocaine 3 cc  Anesthesia was provided by ethyl chloride and the skin was prepped with alcohol.  After cleaning the skin with alcohol a 20-gauge needle was used to inject the right knee joint. There were no complications. A sterile bandage was applied.  3. Schedule surgery left wrist dorsal ganglion   Meds ordered this encounter   Medications  . meloxicam (MOBIC) 15 MG tablet    Sig: Take 1 tablet (15 mg total) by mouth daily.    Dispense:  30 tablet    Refill:  0  . traMADol-acetaminophen (ULTRACET) 37.5-325 MG tablet    Sig: Take 1 tablet by mouth every 6 (six) hours as needed.    Dispense:  28 tablet    Refill:  0    Dorothyann Peng  Aline Brochure, MD  08/16/2018 11:53 AM

## 2018-08-18 ENCOUNTER — Other Ambulatory Visit (HOSPITAL_COMMUNITY): Payer: Self-pay | Admitting: Pulmonary Disease

## 2018-08-18 DIAGNOSIS — R609 Edema, unspecified: Secondary | ICD-10-CM

## 2018-08-25 ENCOUNTER — Ambulatory Visit (HOSPITAL_COMMUNITY): Payer: Medicaid Other

## 2018-08-25 NOTE — Patient Instructions (Signed)
[name]  08/25/2018     @PREFPERIOPPHARMACY @   Your procedure is scheduled on  09/07/2018 .  Report to [facility] at  615   A.M.  Call this number if you have problems the morning of surgery:  [phone]   Remember:  Do not eat or drink after midnight.                       Take these medicines the morning of surgery with A SIP OF WATER  Gabapentin, ativan(if needed), mobic(if needed), zofran (if needed), oxycodone(if needed), protonix, tramadol( if needed). Use your inhalers and your nebulizer before you come.    Do not wear jewelry, make-up or nail polish.  Do not wear lotions, powders, or perfumes, or deodorant.  Do not shave 48 hours prior to surgery.  Men may shave face and neck.  Do not bring valuables to the hospital.  [facility] is not responsible for any belongings or valuables.  Contacts, dentures or bridgework may not be worn into surgery.  Leave your suitcase in the car.  After surgery it may be brought to your room.  For patients admitted to the hospital, discharge time will be determined by your treatment team.  Patients discharged the day of surgery will not be allowed to drive home.   Name and phone number of your driver:   family Special instructions:  DO NOT take any medications for diabetes the morning of your surgery.  Please read over the following fact sheets that you were given. Anesthesia Post-op Instructions and Care and Recovery After Surgery       Incision Care, Adult An incision is a cut that a doctor makes in your skin for surgery (for a procedure). Most times, these cuts are closed after surgery. Your cut from surgery may be closed with stitches (sutures), staples, skin glue, or skin tape (adhesive strips). You may need to return to your doctor to have stitches or staples taken out. This may happen many days or many weeks after your surgery. The cut needs to be well cared for so it does not get infected. How to care  for your cut Cut care   Follow instructions from your doctor about how to take care of your cut. Make sure you: ? Wash your hands with soap and water before you change your bandage (dressing). If you cannot use soap and water, use hand sanitizer. ? Change your bandage as told by your doctor. ? Leave stitches, skin glue, or skin tape in place. They may need to stay in place for 2 weeks or longer. If tape strips get loose and curl up, you may trim the loose edges. Do not remove tape strips completely unless your doctor says it is okay.  Check your cut area every day for signs of infection. Check for: ? More redness, swelling, or pain. ? More fluid or blood. ? Warmth. ? Pus or a bad smell.  Ask your doctor how to clean the cut. This may include: ? Using mild soap and water. ? Using a clean towel to pat the cut dry after you clean it. ? Putting a cream or ointment on the cut. Do this only as told by your doctor. ? Covering the cut with a clean bandage.  Ask your doctor when you can leave the cut uncovered.  Do not take baths, swim, or use a hot tub until your doctor says  it is okay. Ask your doctor if you can take showers. You may only be allowed to take sponge baths for bathing. Medicines  If you were prescribed an antibiotic medicine, cream, or ointment, take the antibiotic or put it on the cut as told by your doctor. Do not stop taking or putting on the antibiotic even if your condition gets better.  Take over-the-counter and prescription medicines only as told by your doctor. General instructions  Limit movement around your cut. This helps healing. ? Avoid straining, lifting, or exercise for the first month, or for as long as told by your doctor. ? Follow instructions from your doctor about going back to your normal activities. ? Ask your doctor what activities are safe.  Protect your cut from the sun when you are outside for the first 6 months, or for as long as told by your  doctor. Put on sunscreen around the scar or cover up the scar.  Keep all follow-up visits as told by your doctor. This is important. Contact a doctor if:  Your have more redness, swelling, or pain around the cut.  You have more fluid or blood coming from the cut.  Your cut feels warm to the touch.  You have pus or a bad smell coming from the cut.  You have a fever or shaking chills.  You feel sick to your stomach (nauseous) or you throw up (vomit).  You are dizzy.  Your stitches or staples come undone. Get help right away if:  You have a red streak coming from your cut.  Your cut bleeds through the bandage and the bleeding does not stop with gentle pressure.  The edges of your cut open up and separate.  You have very bad (severe) pain.  You have a rash.  You are confused.  You pass out (faint).  You have trouble breathing and you have a fast heartbeat. This information is not intended to replace advice given to you by your health care provider. Make sure you discuss any questions you have with your health care provider. Document Released: 09/13/2011 Document Revised: 02/27/2016 Document Reviewed: 02/27/2016 Elsevier Interactive Patient Education  2019 [employer]  Ganglion Cyst  A ganglion cyst is a non-cancerous, fluid-filled lump that occurs near a joint or tendon. The cyst grows out of a joint or the lining of a tendon. Ganglion cysts most often develop in the hand or wrist, but they can also develop in the shoulder, elbow, hip, knee, ankle, or foot. Ganglion cysts are ball-shaped or egg-shaped. Their size can range from the size of a pea to larger than a grape. Increased activity may cause the cyst to get bigger because more fluid starts to build up. What are the causes? The exact cause of this condition is not known, but it may be related to:  Inflammation or irritation around the joint.  An injury.  Repetitive movements or overuse.  Arthritis. What  increases the risk? You are more likely to develop this condition if:  You are a woman.  You are 52-56 years old. What are the signs or symptoms? The main symptom of this condition is a lump. It most often appears on the hand or wrist. In many cases, there are no other symptoms, but a cyst can sometimes cause:  Tingling.  Pain.  Numbness.  Muscle weakness.  Weak grip.  Less range of motion in a joint. How is this diagnosed? Ganglion cysts are usually diagnosed based on a physical exam. Your  health care provider will feel the lump and may shine a light next to it. If it is a ganglion cyst, the light will likely shine through it. Your health care provider may order an X-ray, ultrasound, or MRI to rule out other conditions. How is this treated? Ganglion cysts often go away on their own without treatment. If you have pain or other symptoms, treatment may be needed. Treatment is also needed if the ganglion cyst limits your movement or if it gets infected. Treatment may include:  Wearing a brace or splint on your wrist or finger.  Taking anti-inflammatory medicine.  Having fluid drained from the lump with a needle (aspiration).  Getting a steroid injected into the joint.  Having surgery to remove the ganglion cyst.  Placing a pad on your shoe or wearing shoes that will not rub against the cyst if it is on your foot. Follow these instructions at home:  Do not press on the ganglion cyst, poke it with a needle, or hit it.  Take over-the-counter and prescription medicines only as told by your health care provider.  If you have a brace or splint: ? Wear it as told by your health care provider. ? Remove it as told by your health care provider. Ask if you need to remove it when you take a shower or a bath.  Watch your ganglion cyst for any changes.  Keep all follow-up visits as told by your health care provider. This is important. Contact a health care provider if:  Your  ganglion cyst becomes larger or more painful.  You have pus coming from the lump.  You have weakness or numbness in the affected area.  You have a fever or chills. Get help right away if:  You have a fever and have any of these in the cyst area: ? Increased redness. ? Red streaks. ? Swelling. Summary  A ganglion cyst is a non-cancerous, fluid-filled lump that occurs near a joint or tendon.  Ganglion cysts most often develop in the hand or wrist, but they can also develop in the shoulder, elbow, hip, knee, ankle, or foot.  Ganglion cysts often go away on their own without treatment. This information is not intended to replace advice given to you by your health care provider. Make sure you discuss any questions you have with your health care provider. Document Released: 06/18/2000 Document Revised: 02/18/2017 Document Reviewed: 02/18/2017 Elsevier Interactive Patient Education  2019 [place], Care After These instructions provide you with information about caring for yourself after your procedure. Your health care provider may also give you more specific instructions. Your treatment has been planned according to current medical practices, but problems sometimes occur. Call your health care provider if you have any problems or questions after your procedure. What can I expect after the procedure? After your procedure, you may:  Feel sleepy for several hours.  Feel clumsy and have poor balance for several hours.  Feel forgetful about what happened after the procedure.  Have poor judgment for several hours.  Feel nauseous or vomit.  Have a sore throat if you had a breathing tube during the procedure. Follow these instructions at home: For at least 24 hours after the procedure:      Have a responsible adult stay with you. It is important to have someone help care for you until you are awake and alert.  Rest as needed.  Do not: ? Participate  in activities in which you could fall  or become injured. ? Drive. ? Use heavy machinery. ? Drink alcohol. ? Take sleeping pills or medicines that cause drowsiness. ? Make important decisions or sign legal documents. ? Take care of children on your own. Eating and drinking  Follow the diet that is recommended by your health care provider.  If you vomit, drink water, juice, or soup when you can drink without vomiting.  Make sure you have little or no nausea before eating solid foods. General instructions  Take over-the-counter and prescription medicines only as told by your health care provider.  If you have sleep apnea, surgery and certain medicines can increase your risk for breathing problems. Follow instructions from your health care provider about wearing your sleep device: ? Anytime you are sleeping, including during daytime naps. ? While taking prescription pain medicines, sleeping medicines, or medicines that make you drowsy.  If you smoke, do not smoke without supervision.  Keep all follow-up visits as told by your health care provider. This is important. Contact a health care provider if:  You keep feeling nauseous or you keep vomiting.  You feel light-headed.  You develop a rash.  You have a fever. Get help right away if:  You have trouble breathing. Summary  For several hours after your procedure, you may feel sleepy and have poor judgment.  Have a responsible adult stay with you for at least 24 hours or until you are awake and alert. This information is not intended to replace advice given to you by your health care provider. Make sure you discuss any questions you have with your health care provider. Document Released: 10/12/2015 Document Revised: 02/04/2017 Document Reviewed: 10/12/2015 Elsevier Interactive Patient Education  2019 [employer].

## 2018-08-31 ENCOUNTER — Encounter (HOSPITAL_COMMUNITY)
Admission: RE | Admit: 2018-08-31 | Discharge: 2018-08-31 | Disposition: A | Discharge status: Home / Self Care | Payer: Medicaid Other | Source: Ambulatory Visit | Attending: Orthopedic Surgery | Admitting: Orthopedic Surgery

## 2018-08-31 ENCOUNTER — Other Ambulatory Visit: Payer: Self-pay

## 2018-08-31 ENCOUNTER — Encounter (HOSPITAL_COMMUNITY): Payer: Self-pay

## 2018-08-31 ENCOUNTER — Other Ambulatory Visit (HOSPITAL_COMMUNITY): Payer: Medicaid Other

## 2018-08-31 DIAGNOSIS — Z01818 Encounter for other preprocedural examination: Secondary | ICD-10-CM | POA: Insufficient documentation

## 2018-08-31 DIAGNOSIS — I10 Essential (primary) hypertension: Secondary | ICD-10-CM | POA: Diagnosis not present

## 2018-08-31 HISTORY — DX: Type 2 diabetes mellitus without complications: E11.9

## 2018-08-31 HISTORY — DX: Unspecified osteoarthritis, unspecified site: M19.90

## 2018-08-31 HISTORY — DX: Personal history of urinary calculi: Z87.442

## 2018-08-31 LAB — CBC WITH DIFFERENTIAL/PLATELET
Abs Immature Granulocytes: 0.12 10*3/uL — ABNORMAL HIGH (ref 0.00–0.07)
Basophils Absolute: 0 10*3/uL (ref 0.0–0.1)
Basophils Relative: 0 %
EOS PCT: 2 %
Eosinophils Absolute: 0.3 10*3/uL (ref 0.0–0.5)
HCT: 42.9 % (ref 36.0–46.0)
Hemoglobin: 14 g/dL (ref 12.0–15.0)
Immature Granulocytes: 1 %
Lymphocytes Relative: 32 %
Lymphs Abs: 3.8 10*3/uL (ref 0.7–4.0)
MCH: 32 pg (ref 26.0–34.0)
MCHC: 32.6 g/dL (ref 30.0–36.0)
MCV: 97.9 fL (ref 80.0–100.0)
MONO ABS: 1.3 10*3/uL — AB (ref 0.1–1.0)
Monocytes Relative: 11 %
Neutro Abs: 6.4 10*3/uL (ref 1.7–7.7)
Neutrophils Relative %: 54 %
Platelets: 235 10*3/uL (ref 150–400)
RBC: 4.38 MIL/uL (ref 3.87–5.11)
RDW: 13.3 % (ref 11.5–15.5)
WBC: 11.9 10*3/uL — AB (ref 4.0–10.5)
nRBC: 0 % (ref 0.0–0.2)

## 2018-08-31 LAB — HEMOGLOBIN A1C
Hgb A1c MFr Bld: 8.3 % — ABNORMAL HIGH (ref 4.8–5.6)
Mean Plasma Glucose: 191.51 mg/dL

## 2018-08-31 LAB — BASIC METABOLIC PANEL
ANION GAP: 9 (ref 5–15)
BUN: 15 mg/dL (ref 8–23)
CALCIUM: 9.9 mg/dL (ref 8.9–10.3)
CO2: 27 mmol/L (ref 22–32)
Chloride: 101 mmol/L (ref 98–111)
Creatinine, Ser: 0.61 mg/dL (ref 0.44–1.00)
GFR calc Af Amer: >60 mL/min (ref >60)
GFR calc non Af Amer: >60 mL/min (ref >60)
Glucose, Bld: 111 mg/dL — ABNORMAL HIGH (ref 70–99)
Potassium: 3.9 mmol/L (ref 3.5–5.1)
Sodium: 137 mmol/L (ref 135–145)

## 2018-08-31 LAB — GLUCOSE, CAPILLARY: Glucose-Capillary: 126 mg/dL — ABNORMAL HIGH (ref 70–99)

## 2018-09-01 NOTE — Pre-Procedure Instructions (Signed)
HgbA1C routed to PCP and Dr Aline Brochure.

## 2018-09-05 ENCOUNTER — Other Ambulatory Visit (HOSPITAL_COMMUNITY): Payer: Medicaid Other

## 2018-09-06 NOTE — H&P (Signed)
NEW PATIENT OFFICE VISIT       Chief Complaint  Patient presents with  . Knee Pain      bilateral/ requesting injections       Mry is 63 years old She has a mass on the dorsal aspect of her left wrist and hand it is been there for about a year or so no history of trauma it is starting to cause a lot of pain and she would like it removed.   She also complains of neck pain and shoulder pain     Review of Systems  Musculoskeletal: Positive for joint pain and neck pain.  Neurological: Negative for tingling, sensory change and focal weakness.  All other systems reviewed and are negative.           Past Medical History:  Diagnosis Date  . Anxiety    . Bipolar 1 disorder ([place])    . COPD (chronic obstructive pulmonary disease) ([place])    . Depression    . Emphysema of lung ([place])    . GERD (gastroesophageal reflux disease)    . Hyperlipidemia    . Hypertension    . Pneumonia 2015  . Vaginal Pap smear, abnormal             Past Surgical History:  Procedure Laterality Date  . CHOLECYSTECTOMY      . COLONOSCOPY   July 2010    Dr. Arnoldo Morale: 3 rectal polyps, not enough tissue for pathologic examination, recommended surveillance in 3 years  . COLONOSCOPY N/A 10/23/2014    RMR: Multiple colonic polyps removed as described above. No endoscopic explaniation for abdominal pain. however. next tcs 10/2019  . ESOPHAGOGASTRODUODENOSCOPY   July 2010    Dr. Arnoldo Morale: gastritis and duodenitis, H.pylori negative  . ESOPHAGOGASTRODUODENOSCOPY N/A 10/23/2014    RMR: Normal EGD. Status post passage of a Maloney dilator. Today's finding s would not explain abdominal pain  . HERNIA REPAIR        Dr. Arnoldo Morale  . KIDNEY STONE SURGERY      . MALONEY DILATION N/A 10/23/2014    Procedure: Venia Minks DILATION;  Surgeon: Daneil Dolin, MD;  Location: [facility];  Service: Endoscopy;  Laterality: N/A;  . OPEN REDUCTION INTERNAL FIXATION (ORIF) DISTAL RADIAL FRACTURE Right 12/09/2015    Procedure: OPEN  REDUCTION INTERNAL FIXATION (ORIF) RIGHT DISTAL RADIUS;  Surgeon: Leanora Cover, MD;  Location: [facility];  Service: Orthopedics;  Laterality: Right;      Family History  Adopted: Yes    Social History         Tobacco Use  . Smoking status: Current Some Day Smoker      Packs/day: 0.50      Years: 42.00      Pack years: 21.00      Types: Cigarettes  . Smokeless tobacco: Never Used  . Tobacco comment: 1/2 pack daily  Substance Use Topics  . Alcohol use: Yes      Alcohol/week: 1.0 standard drinks      Types: 1 Standard drinks or equivalent per week      Comment: quit 11/03/2014. used to drink couple of 40 ounces.  . Drug use: No           Allergies  Allergen Reactions  . Penicillins Other (See Comments)      Patient is unsure if she allergic to penicillin or septra. Patient states one or another caused "rib pain with a little breathing problem". Has patient had a PCN  reaction causing immediate rash, facial/tongue/throat swelling, SOB or lightheadedness with hypotension: YES Has patient had a PCN reaction causing severe rash involving mucus membranes or skin necrosis: NO Has patient had a PCN reaction that required hospitalization: NO Has patient had a PCN reaction occurring within the last 10 years: NO  . Septra [Sulfamethoxazole-Trimethoprim] Other (See Comments)      Patient is unsure if she allergic to septra or penicillin. Patient states one or another caused "rib pain with a little breathing problem".          Current Meds  Medication Sig  . albuterol (PROVENTIL HFA;VENTOLIN HFA) 108 (90 Base) MCG/ACT inhaler Inhale 2 puffs into the lungs every 6 (six) hours as needed for wheezing or shortness of breath.  Marland Kitchen albuterol (PROVENTIL) (2.5 MG/3ML) 0.083% nebulizer solution Take 2.5 mg by nebulization every 6 (six) hours as needed for wheezing or shortness of breath.  Marland Kitchen albuterol-ipratropium (COMBIVENT) 18-103 MCG/ACT inhaler Inhale into the lungs every 4 (four)  hours.  . baclofen (LIORESAL) 10 MG tablet Take 10 mg by mouth 3 (three) times daily.  . budesonide-formoterol (SYMBICORT) 160-4.5 MCG/ACT inhaler Inhale into the lungs.  . cycloSPORINE (RESTASIS) 0.05 % ophthalmic emulsion Place 1 drop into both eyes 2 (two) times daily.  . fluticasone (FLONASE) 50 MCG/ACT nasal spray Place 1 spray into both nostrils 2 (two) times daily as needed for allergies or rhinitis.  . furosemide (LASIX) 40 MG tablet Take 1 tablet (40 mg total) by mouth daily.  Marland Kitchen gabapentin (NEURONTIN) 300 MG capsule Take 1 capsule (300 mg total) by mouth at bedtime.  Marland Kitchen guaiFENesin (MUCINEX) 600 MG 12 hr tablet Take 2 tablets (1,200 mg total) by mouth 2 (two) times daily.  Marland Kitchen HYDROcodone-homatropine (HYCODAN) 5-1.5 MG/5ML syrup Take 5 mLs by mouth every 6 (six) hours as needed for cough.  . lidocaine (XYLOCAINE) 2 % solution TAKE 15 MLS BY MOUTH EVERY 6 HOURS AS NEEDED.  Marland Kitchen lisinopril (PRINIVIL,ZESTRIL) 20 MG tablet TAKE ONE TABLET BY MOUTH DAILY.  Marland Kitchen loratadine (CLARITIN) 10 MG tablet Take 1 tablet (10 mg total) by mouth daily.  Marland Kitchen LORazepam (ATIVAN) 0.5 MG tablet Take 1 tablet (0.5 mg total) by mouth every 8 (eight) hours as needed for anxiety.  . lovastatin (MEVACOR) 40 MG tablet Take 40 mg by mouth at bedtime.  . metFORMIN (GLUCOPHAGE) 500 MG tablet Take 1 tablet (500 mg total) by mouth 2 (two) times daily with a meal.  . nicotine (NICODERM CQ - DOSED IN MG/24 HOURS) 21 mg/24hr patch Place 1 patch (21 mg total) onto the skin daily.  . nitroGLYCERIN (NITROSTAT) 0.4 MG SL tablet DISSOLVE 1 TABLET UNDER THE TONGUE EVERY 5 MINUTES IF NEEDED FOR CHEST PAIN. MAX 3 DOSES THEN CALL 911.  . ondansetron (ZOFRAN) 4 MG tablet Take by mouth.  . pantoprazole (PROTONIX) 40 MG tablet Take 1 tablet (40 mg total) by mouth daily at 6 (six) AM.  . predniSONE (DELTASONE) 10 MG tablet Take 6 tabs x 2 days, then 5 tabs x 2 days, then 4 tabs x 2 days, then 3 tabs x 2 days, then 1 tab x 2 days.  Marland Kitchen PREMARIN  vaginal cream INSERT 1 APPLICATORFUL (1 GRAM) VAGINALLY DAILY.  Marland Kitchen rOPINIRole (REQUIP) 1 MG tablet Take 1 tablet by mouth at bedtime.   . sucralfate (CARAFATE) 1 GM/10ML suspension Take by mouth.      BP 105/65   Pulse 95   Ht 5\' 4"  (1.626 m)   Wt 226 lb (102.5 kg)  BMI 38.79 kg/m    Physical Exam The patient has mild to moderate obesity but otherwise well-groomed normal appearance no gross deformity no nutritional abnormalities Ortho Exam   Gait is heel-to-toe with normal speed without limping   Starting with the right and left upper extremity no gross deformity full range of motion mild pain with range of motion left shoulder.  Both shoulders elbows wrists showed no subluxation dislocation muscle tone and strength are normal with no tremor or atrophy skin is warm dry and intact with no rash ulcerations or erythema pulse and temperature normal lymph nodes in the axilla negative normal sensation throughout both limbs deep tendon reflexes are normal and there are no pathologic reflexes rapid movements of the hands are normal   Left wrist has a small dorsal ganglion firm mobile under the skin (aspiration of was attempted with alcohol skin prep ethyl chloride 18-gauge needle 3 cc syringe I cannot get any fluid out) see below patient will have surgery for painful ganglion cyst dorsum left hand   Right knee medial joint line tenderness no effusion knee alignment looks normal grossly knee flexion arc is 120 degrees knee is stable in all planes muscle strength and tone are normal skin is normal with no atrophy pulse and temperature normal mild edema no lymphadenopathy sensation normal   Left knee we also find medial joint line tenderness crepitance on range of motion flexion arc of 120 degrees ligaments stable in all planes no atrophy or muscle spasm noted strength in the quadriceps normal skin is intact pulse and temperature normal minimal edema sensation is normal reflexes are good and balance  overall is normal    MEDICAL DECISION SECTION    Xrays were done at [facility]    . Ganglion cyst left wrist         PLAN: (Rx., injectx, surgery, frx, mri/ct)   Schedule surgery left wrist dorsal ganglion    09/06/2018 Arther Abbott, MD

## 2018-09-07 ENCOUNTER — Encounter (HOSPITAL_COMMUNITY): Payer: Self-pay | Admitting: Anesthesiology

## 2018-09-07 ENCOUNTER — Encounter (HOSPITAL_COMMUNITY)
Admission: RE | Disposition: A | Discharge status: Home / Self Care | Payer: Self-pay | Source: Home / Self Care | Attending: Orthopedic Surgery

## 2018-09-07 ENCOUNTER — Ambulatory Visit (HOSPITAL_COMMUNITY): Admission: RE | Admit: 2018-09-07 | Payer: Medicaid Other | Source: Ambulatory Visit

## 2018-09-07 ENCOUNTER — Ambulatory Visit (HOSPITAL_COMMUNITY): Payer: Medicaid Other | Admitting: Anesthesiology

## 2018-09-07 ENCOUNTER — Ambulatory Visit (HOSPITAL_COMMUNITY)
Admission: RE | Admit: 2018-09-07 | Discharge: 2018-09-07 | Disposition: A | Discharge status: Home / Self Care | Payer: Medicaid Other | Attending: Orthopedic Surgery | Admitting: Orthopedic Surgery

## 2018-09-07 DIAGNOSIS — F17210 Nicotine dependence, cigarettes, uncomplicated: Secondary | ICD-10-CM | POA: Insufficient documentation

## 2018-09-07 DIAGNOSIS — Z7951 Long term (current) use of inhaled steroids: Secondary | ICD-10-CM | POA: Diagnosis not present

## 2018-09-07 DIAGNOSIS — M67432 Ganglion, left wrist: Secondary | ICD-10-CM

## 2018-09-07 DIAGNOSIS — Z7984 Long term (current) use of oral hypoglycemic drugs: Secondary | ICD-10-CM | POA: Diagnosis not present

## 2018-09-07 DIAGNOSIS — E785 Hyperlipidemia, unspecified: Secondary | ICD-10-CM | POA: Insufficient documentation

## 2018-09-07 DIAGNOSIS — J439 Emphysema, unspecified: Secondary | ICD-10-CM | POA: Insufficient documentation

## 2018-09-07 DIAGNOSIS — K219 Gastro-esophageal reflux disease without esophagitis: Secondary | ICD-10-CM | POA: Insufficient documentation

## 2018-09-07 DIAGNOSIS — Z79890 Hormone replacement therapy (postmenopausal): Secondary | ICD-10-CM | POA: Diagnosis not present

## 2018-09-07 DIAGNOSIS — F419 Anxiety disorder, unspecified: Secondary | ICD-10-CM | POA: Diagnosis not present

## 2018-09-07 DIAGNOSIS — M25569 Pain in unspecified knee: Secondary | ICD-10-CM | POA: Diagnosis present

## 2018-09-07 DIAGNOSIS — I10 Essential (primary) hypertension: Secondary | ICD-10-CM | POA: Insufficient documentation

## 2018-09-07 DIAGNOSIS — Z79899 Other long term (current) drug therapy: Secondary | ICD-10-CM | POA: Insufficient documentation

## 2018-09-07 DIAGNOSIS — Z9981 Dependence on supplemental oxygen: Secondary | ICD-10-CM | POA: Diagnosis not present

## 2018-09-07 HISTORY — PX: GANGLION CYST EXCISION: SHX1691

## 2018-09-07 LAB — GLUCOSE, CAPILLARY: Glucose-Capillary: 124 mg/dL — ABNORMAL HIGH (ref 70–99)

## 2018-09-07 SURGERY — EXCISION, GANGLION CYST, WRIST
Anesthesia: Regional | Site: Wrist | Laterality: Left

## 2018-09-07 MED ORDER — PROMETHAZINE HCL 25 MG/ML IJ SOLN: 6.2500 mg | INTRAMUSCULAR | Status: DC | PRN

## 2018-09-07 MED ORDER — 0.9 % SODIUM CHLORIDE (POUR BTL) OPTIME
TOPICAL | Status: DC | PRN
  Administered 2018-09-07: 1000 mL

## 2018-09-07 MED ORDER — BUPIVACAINE HCL (PF) 0.5 % IJ SOLN
INTRAMUSCULAR | Status: DC | PRN
  Administered 2018-09-07: 10 mL

## 2018-09-07 MED ORDER — LIDOCAINE HCL (PF) 0.5 % IJ SOLN
INTRAMUSCULAR | Status: AC
  Filled 2018-09-07: qty 50

## 2018-09-07 MED ORDER — FENTANYL CITRATE (PF) 100 MCG/2ML IJ SOLN
INTRAMUSCULAR | Status: AC
  Filled 2018-09-07: qty 2

## 2018-09-07 MED ORDER — HYDROMORPHONE HCL 1 MG/ML IJ SOLN: 0.2500 mg | INTRAMUSCULAR | Status: DC | PRN

## 2018-09-07 MED ORDER — MIDAZOLAM HCL 5 MG/5ML IJ SOLN
INTRAMUSCULAR | Status: DC | PRN
  Administered 2018-09-07: 2 mg via INTRAVENOUS

## 2018-09-07 MED ORDER — PROPOFOL 10 MG/ML IV BOLUS
INTRAVENOUS | Status: DC | PRN
  Administered 2018-09-07 (×5): 20 mg via INTRAVENOUS
  Administered 2018-09-07 (×2): 10 mg via INTRAVENOUS
  Administered 2018-09-07: 20 mg via INTRAVENOUS

## 2018-09-07 MED ORDER — PROPOFOL 500 MG/50ML IV EMUL
INTRAVENOUS | Status: DC | PRN
  Administered 2018-09-07: 25 ug/kg/min via INTRAVENOUS

## 2018-09-07 MED ORDER — BUPIVACAINE HCL (PF) 0.5 % IJ SOLN
INTRAMUSCULAR | Status: AC
  Filled 2018-09-07: qty 30

## 2018-09-07 MED ORDER — CHLORHEXIDINE GLUCONATE 4 % EX LIQD: 60.0000 mL | Freq: Once | CUTANEOUS | Status: DC

## 2018-09-07 MED ORDER — IPRATROPIUM-ALBUTEROL 0.5-2.5 (3) MG/3ML IN SOLN
3.0000 mL | RESPIRATORY_TRACT | Status: DC
  Administered 2018-09-07: 3 mL via RESPIRATORY_TRACT

## 2018-09-07 MED ORDER — LACTATED RINGERS IV SOLN
INTRAVENOUS | Status: DC
  Administered 2018-09-07: 07:00:00 via INTRAVENOUS

## 2018-09-07 MED ORDER — MIDAZOLAM HCL 2 MG/2ML IJ SOLN
INTRAMUSCULAR | Status: AC
  Filled 2018-09-07: qty 2

## 2018-09-07 MED ORDER — LIDOCAINE HCL (PF) 0.5 % IJ SOLN
INTRAMUSCULAR | Status: DC | PRN
  Administered 2018-09-07: 50 mL via INTRAVENOUS

## 2018-09-07 MED ORDER — FENTANYL CITRATE (PF) 100 MCG/2ML IJ SOLN
INTRAMUSCULAR | Status: DC | PRN
  Administered 2018-09-07 (×3): 25 ug via INTRAVENOUS

## 2018-09-07 MED ORDER — IPRATROPIUM-ALBUTEROL 0.5-2.5 (3) MG/3ML IN SOLN
RESPIRATORY_TRACT | Status: AC
  Filled 2018-09-07: qty 3

## 2018-09-07 MED ORDER — PROPOFOL 10 MG/ML IV BOLUS
INTRAVENOUS | Status: AC
  Filled 2018-09-07: qty 20

## 2018-09-07 MED ORDER — VANCOMYCIN HCL 10 G IV SOLR
1500.0000 mg | INTRAVENOUS | Status: AC
  Administered 2018-09-07: 1000 mg via INTRAVENOUS

## 2018-09-07 MED ORDER — MIDAZOLAM HCL 2 MG/2ML IJ SOLN: 0.5000 mg | Freq: Once | INTRAMUSCULAR | Status: DC | PRN

## 2018-09-07 MED ORDER — HYDROCODONE-ACETAMINOPHEN 7.5-325 MG PO TABS: 1.0000 | ORAL_TABLET | Freq: Once | ORAL | Status: DC | PRN

## 2018-09-07 SURGICAL SUPPLY — 46 items
APL SKNCLS STERI-STRIP NONHPOA (GAUZE/BANDAGES/DRESSINGS) ×1
BANDAGE ELASTIC 3 LF NS (GAUZE/BANDAGES/DRESSINGS) ×3 IMPLANT
BANDAGE ESMARK 4X12 BL STRL LF (DISPOSABLE) IMPLANT
BENZOIN TINCTURE PRP APPL 2/3 (GAUZE/BANDAGES/DRESSINGS) ×2 IMPLANT
BNDG CMPR 12X4 ELC STRL LF (DISPOSABLE) ×1
BNDG CMPR MED 5X3 ELC HKLP NS (GAUZE/BANDAGES/DRESSINGS) ×1
BNDG COHESIVE 4X5 TAN STRL (GAUZE/BANDAGES/DRESSINGS) ×3 IMPLANT
BNDG ESMARK 4X12 BLUE STRL LF (DISPOSABLE) ×3
BNDG GAUZE ELAST 4 BULKY (GAUZE/BANDAGES/DRESSINGS) ×3 IMPLANT
CHLORAPREP W/TINT 26ML (MISCELLANEOUS) ×3 IMPLANT
CLOSURE WOUND 1/2 X4 (GAUZE/BANDAGES/DRESSINGS) ×1
CLOTH BEACON ORANGE TIMEOUT ST (SAFETY) ×3 IMPLANT
COVER LIGHT HANDLE STERIS (MISCELLANEOUS) ×6 IMPLANT
COVER WAND RF STERILE (DRAPES) ×2 IMPLANT
CUFF TOURNIQUET SINGLE 18IN (TOURNIQUET CUFF) ×3 IMPLANT
DECANTER SPIKE VIAL GLASS SM (MISCELLANEOUS) ×3 IMPLANT
DRAPE PROXIMA HALF (DRAPES) ×3 IMPLANT
DRSG XEROFORM 1X8 (GAUZE/BANDAGES/DRESSINGS) ×2 IMPLANT
ELECT NDL TIP 2.8 STRL (NEEDLE) ×1 IMPLANT
ELECT NEEDLE TIP 2.8 STRL (NEEDLE) ×3 IMPLANT
ELECT REM PT RETURN 9FT ADLT (ELECTROSURGICAL) ×3
ELECTRODE REM PT RTRN 9FT ADLT (ELECTROSURGICAL) ×1 IMPLANT
GAUZE SPONGE 4X4 12PLY STRL (GAUZE/BANDAGES/DRESSINGS) ×3 IMPLANT
GLOVE BIO SURGEON STRL SZ7 (GLOVE) ×2 IMPLANT
GLOVE BIOGEL PI IND STRL 7.0 (GLOVE) ×1 IMPLANT
GLOVE BIOGEL PI INDICATOR 7.0 (GLOVE) ×4
GLOVE SKINSENSE NS SZ8.0 LF (GLOVE) ×2
GLOVE SKINSENSE STRL SZ8.0 LF (GLOVE) ×1 IMPLANT
GLOVE SS N UNI LF 8.5 STRL (GLOVE) ×3 IMPLANT
GOWN STRL REUS W/TWL LRG LVL3 (GOWN DISPOSABLE) ×3 IMPLANT
GOWN STRL REUS W/TWL XL LVL3 (GOWN DISPOSABLE) ×3 IMPLANT
KIT TURNOVER KIT A (KITS) ×3 IMPLANT
MANIFOLD NEPTUNE II (INSTRUMENTS) ×3 IMPLANT
NDL HYPO 21X1.5 SAFETY (NEEDLE) ×1 IMPLANT
NEEDLE HYPO 21X1.5 SAFETY (NEEDLE) ×3 IMPLANT
NS IRRIG 1000ML POUR BTL (IV SOLUTION) ×3 IMPLANT
PACK BASIC LIMB (CUSTOM PROCEDURE TRAY) ×3 IMPLANT
PAD ARMBOARD 7.5X6 YLW CONV (MISCELLANEOUS) ×3 IMPLANT
POSITIONER HAND ALUMI XLG (MISCELLANEOUS) ×2 IMPLANT
SET BASIN LINEN APH (SET/KITS/TRAYS/PACK) ×3 IMPLANT
STRIP CLOSURE SKIN 1/2X4 (GAUZE/BANDAGES/DRESSINGS) ×2 IMPLANT
SUT ETHILON 3 0 FSL (SUTURE) ×2 IMPLANT
SUT MON AB 2-0 SH 27 (SUTURE) ×3
SUT MON AB 2-0 SH27 (SUTURE) ×1 IMPLANT
SUT PROLENE 3 0 PS 2 (SUTURE) IMPLANT
SYR CONTROL 10ML LL (SYRINGE) ×3 IMPLANT

## 2018-09-07 NOTE — Anesthesia Postprocedure Evaluation (Signed)
Anesthesia Post Note  Patient: [name]  Procedure(s) Performed: REMOVAL GANGLION OF WRIST (Left Wrist)  Patient location during evaluation: PACU Anesthesia Type: Bier Block Level of consciousness: awake and alert and oriented Pain management: pain level controlled Vital Signs Assessment: post-procedure vital signs reviewed and stable Respiratory status: spontaneous breathing Cardiovascular status: blood pressure returned to baseline and stable Postop Assessment: no apparent nausea or vomiting Anesthetic complications: no     Last Vitals:  Vitals:   09/07/18 0849 09/07/18 0902  BP: (!) 115/95 (!) 144/84  Pulse: 86 80  Resp: 13 16  Temp:  36.7 C  SpO2: 100% 94%    Last Pain:  Vitals:   09/07/18 0902  TempSrc: Oral  PainSc: 4                  Brayten Komar

## 2018-09-07 NOTE — Op Note (Signed)
09/07/2018  8:23 AM  PATIENT:  [name]  63 y.o. female  PRE-OPERATIVE DIAGNOSIS:  ganglion cyst left wrist  POST-OPERATIVE DIAGNOSIS:  ganglion cyst left wrist  PROCEDURE:  Procedure(s): REMOVAL GANGLION OF WRIST (Left)   Operative findings the ganglion cyst took origin from the radiocarpal joint with characteristic stalk arising from the wrist joint capsule  SURGEON:  Surgeon(s) and Role:    Carole Civil, MD - Primary  Details of procedure: Patient was identified in the preop area after thorough chart review the surgical site was confirmed and marked his left wrist.  The patient was taken to the operating room for regional Bier block.  Appropriate antibiotics were started.  Left wrist was prepped and draped sterilely.  Timeout was completed to confirm procedure as excision dorsal wrist ganglion left upper extremity  A longitudinal incision was made centered over the ganglion the subcutaneous tissue was divided with sharp dissection.  The subcutaneous tissue was bluntly dissected around the mass and then the mass was tract down to the dorsal wrist capsule protecting the extensor tendons and the superficial radial nerve branches.  The mass was removed as 1 piece.  The wound was then irrigated hemostasis was obtained with electrocautery.  Thorough irrigation was performed and then the wound was closed with running 2-0 Monocryl subcuticular stitch followed by application of Steri-Strips.  A field block was performed with 10 cc of Marcaine plain.  Sterile dressing was applied followed by compression after tourniquet release.  The patient was then taken to recovery room in stable condition  PHYSICIAN ASSISTANT:   ASSISTANTS: none   ANESTHESIA:   regional  EBL:  0 mL   BLOOD ADMINISTERED:none  DRAINS: none   LOCAL MEDICATIONS USED:  MARCAINE     SPECIMEN:  Source of Specimen:  Left wrist joint capsule  DISPOSITION OF SPECIMEN:  PATHOLOGY  COUNTS:   YES  TOURNIQUET:   Total Tourniquet Time Documented: Upper Arm (Left) - 39 minutes Total: Upper Arm (Left) - 39 minutes   DICTATION: .Viviann Spare Dictation  PLAN OF CARE: Discharge to home after PACU  PATIENT DISPOSITION:  PACU - hemodynamically stable.   Delay start of Pharmacological VTE agent (>24hrs) due to surgical blood loss or risk of bleeding: not applicable

## 2018-09-07 NOTE — Progress Notes (Signed)
Patient and husband waiting for ride to take them home.

## 2018-09-07 NOTE — Brief Op Note (Signed)
09/07/2018  8:23 AM  PATIENT:  [name]  63 y.o. female  PRE-OPERATIVE DIAGNOSIS:  ganglion cyst left wrist  POST-OPERATIVE DIAGNOSIS:  ganglion cyst left wrist  PROCEDURE:  Procedure(s): REMOVAL GANGLION OF WRIST (Left)   Operative findings the ganglion cyst took origin from the radiocarpal joint with characteristic stalk arising from the wrist joint capsule  SURGEON:  Surgeon(s) and Role:    Carole Civil, MD - Primary  PHYSICIAN ASSISTANT:   ASSISTANTS: none   ANESTHESIA:   regional  EBL:  0 mL   BLOOD ADMINISTERED:none  DRAINS: none   LOCAL MEDICATIONS USED:  MARCAINE     SPECIMEN:  Source of Specimen:  Left wrist joint capsule  DISPOSITION OF SPECIMEN:  PATHOLOGY  COUNTS:  YES  TOURNIQUET:   Total Tourniquet Time Documented: Upper Arm (Left) - 39 minutes Total: Upper Arm (Left) - 39 minutes   DICTATION: .Viviann Spare Dictation  PLAN OF CARE: Discharge to home after PACU  PATIENT DISPOSITION:  PACU - hemodynamically stable.   Delay start of Pharmacological VTE agent (>24hrs) due to surgical blood loss or risk of bleeding: not applicable

## 2018-09-07 NOTE — Anesthesia Procedure Notes (Signed)
Anesthesia Regional Block: Bier block (IV Regional)   Pre-Anesthetic Checklist: ,, timeout performed, Correct Patient, Correct Site, Correct Laterality, Correct Procedure,, site marked, surgical consent,, at surgeon's request  Laterality: Left     Needles:  Injection technique: Single-shot  Needle Type: Other      Needle Gauge: 22     Additional Needles:   Procedures:,,,,, intact distal pulses, Esmarch exsanguination, single tourniquet utilized,   Nerve Stimulator or Paresthesia:   Additional Responses:  Pulse checked post tourniquet inflation. IV NSL discontinued post injection. Narrative:   Performed by: Personally

## 2018-09-07 NOTE — Transfer of Care (Signed)
Immediate Anesthesia Transfer of Care Note  Patient: [name]  Procedure(s) Performed: REMOVAL GANGLION OF WRIST (Left Wrist)  Patient Location: PACU  Anesthesia Type:MAC and Bier block  Level of Consciousness: awake  Airway & Oxygen Therapy: Patient Spontanous Breathing  Post-op Assessment: Report given to RN and Post -op Vital signs reviewed and stable  Post vital signs: Reviewed  Last Vitals:  Vitals Value Taken Time  BP 105/84 09/07/2018  8:31 AM  Temp 36.7 C 09/07/2018  8:28 AM  Pulse 86 09/07/2018  8:35 AM  Resp 27 09/07/2018  8:35 AM  SpO2 98 % 09/07/2018  8:35 AM  Vitals shown include unvalidated device data.  Last Pain:  Vitals:   09/07/18 2550  TempSrc: Oral  PainSc: 0-No pain      Patients Stated Pain Goal: 5 (01/64/29 0379)  Complications: No apparent anesthesia complications

## 2018-09-07 NOTE — Discharge Instructions (Signed)
Monitored Anesthesia Care, Care After  These instructions provide you with information about caring for yourself after your procedure. Your health care provider may also give you more specific instructions. Your treatment has been planned according to current medical practices, but problems sometimes occur. Call your health care provider if you have any problems or questions after your procedure.  What can I expect after the procedure?  After your procedure, you may:  Feel sleepy for several hours.  Feel clumsy and have poor balance for several hours.  Feel forgetful about what happened after the procedure.  Have poor judgment for several hours.  Feel nauseous or vomit.  Have a sore throat if you had a breathing tube during the procedure.  Follow these instructions at home:  For at least 24 hours after the procedure:         Have a responsible adult stay with you. It is important to have someone help care for you until you are awake and alert.  Rest as needed.  Do not:  Participate in activities in which you could fall or become injured.  Drive.  Use heavy machinery.  Drink alcohol.  Take sleeping pills or medicines that cause drowsiness.  Make important decisions or sign legal documents.  Take care of children on your own.  Eating and drinking  Follow the diet that is recommended by your health care provider.  If you vomit, drink water, juice, or soup when you can drink without vomiting.  Make sure you have little or no nausea before eating solid foods.  General instructions  Take over-the-counter and prescription medicines only as told by your health care provider.  If you have sleep apnea, surgery and certain medicines can increase your risk for breathing problems. Follow instructions from your health care provider about wearing your sleep device:  Anytime you are sleeping, including during daytime naps.  While taking prescription pain medicines, sleeping medicines, or medicines that make you drowsy.  If you  smoke, do not smoke without supervision.  Keep all follow-up visits as told by your health care provider. This is important.  Contact a health care provider if:  You keep feeling nauseous or you keep vomiting.  You feel light-headed.  You develop a rash.  You have a fever.  Get help right away if:  You have trouble breathing.  Summary  For several hours after your procedure, you may feel sleepy and have poor judgment.  Have a responsible adult stay with you for at least 24 hours or until you are awake and alert.  This information is not intended to replace advice given to you by your health care provider. Make sure you discuss any questions you have with your health care provider.  Document Released: 10/12/2015 Document Revised: 02/04/2017 Document Reviewed: 10/12/2015  Elsevier Interactive Patient Education  2019 [employer]

## 2018-09-07 NOTE — Interval H&P Note (Signed)
History and Physical Interval Note:  09/07/2018 7:13 AM  [name]  has presented today for surgery, with the diagnosis of ganglion cyst left wrist  The various methods of treatment have been discussed with the patient and family. After consideration of risks, benefits and other options for treatment, the patient has consented to  Procedure(s): REMOVAL GANGLION OF WRIST (Left) as a surgical intervention .  The patient's history has been reviewed, patient examined, no change in status, stable for surgery.  I have reviewed the patient's chart and labs.  Questions were answered to the patient's satisfaction.     Arther Abbott

## 2018-09-07 NOTE — Anesthesia Preprocedure Evaluation (Signed)
Anesthesia Evaluation  Patient identified by MRN, date of birth, ID band Patient awake    Reviewed: Allergy & Precautions, NPO status , Patient's Chart, lab work & pertinent test results  Airway Mallampati: II  TM Distance: >3 FB Neck ROM: Full    Dental no notable dental hx. (+) Edentulous Upper, Edentulous Lower   Pulmonary pneumonia, resolved, COPD,  COPD inhaler and oxygen dependent, Current Smoker,  1/2 PPD COPD o2 dependent    Pulmonary exam normal  + decreased breath sounds+ wheezing      Cardiovascular Exercise Tolerance: Poor hypertension, Pt. on medications negative cardio ROS Normal cardiovascular examII Rhythm:Regular Rate:Normal  Last NTG about a week ago -treated for for recent URI with Doxy -says improved   Neuro/Psych Anxiety Depression Bipolar Disorder negative neurological ROS  negative psych ROS   GI/Hepatic Neg liver ROS, GERD  Medicated and Controlled,  Endo/Other  negative endocrine ROSdiabetes, Type 2  Renal/GU negative Renal ROS  negative genitourinary   Musculoskeletal  (+) Arthritis ,   Abdominal   Peds negative pediatric ROS (+)  Hematology negative hematology ROS (+)   Anesthesia Other Findings   Reproductive/Obstetrics negative OB ROS                             Anesthesia Physical Anesthesia Plan  ASA: III  Anesthesia Plan: Bier Block and Bier Block-LIDOCAINE ONLY   Post-op Pain Management:    Induction:   PONV Risk Score and Plan:   Airway Management Planned: Nasal Cannula and Simple Face Mask  Additional Equipment:   Intra-op Plan:   Post-operative Plan:   Informed Consent:   Plan Discussed with:   Anesthesia Plan Comments:         Anesthesia Quick Evaluation

## 2018-09-08 ENCOUNTER — Encounter (HOSPITAL_COMMUNITY): Payer: Self-pay | Admitting: Orthopedic Surgery

## 2018-09-12 ENCOUNTER — Ambulatory Visit: Payer: Medicaid Other | Admitting: Sports Medicine

## 2018-09-13 ENCOUNTER — Other Ambulatory Visit: Payer: Self-pay

## 2018-09-13 ENCOUNTER — Ambulatory Visit (HOSPITAL_COMMUNITY)
Admission: RE | Admit: 2018-09-13 | Discharge: 2018-09-13 | Disposition: A | Discharge status: Home / Self Care | Payer: Medicaid Other | Source: Ambulatory Visit | Attending: Pulmonary Disease | Admitting: Pulmonary Disease

## 2018-09-13 DIAGNOSIS — R609 Edema, unspecified: Secondary | ICD-10-CM

## 2018-09-13 NOTE — Progress Notes (Signed)
*  PRELIMINARY RESULTS* Echocardiogram 2D Echocardiogram has been performed.  Samuel Germany 09/13/2018, 11:07 AM

## 2018-09-18 ENCOUNTER — Telehealth: Payer: Self-pay | Admitting: Orthopedic Surgery

## 2018-09-18 NOTE — Telephone Encounter (Signed)
[name] called and left message requesting something stronger for pain.  She states she uses [facility]

## 2018-09-18 NOTE — Telephone Encounter (Signed)
I have called patient to advise.

## 2018-09-18 NOTE — Telephone Encounter (Signed)
Cant she has 6 days of hydocodone syrup ends 3/18 according to pdmp

## 2018-09-18 NOTE — Telephone Encounter (Signed)
She is asking for meds stronger than tramadol please advise

## 2018-09-20 ENCOUNTER — Telehealth: Payer: Self-pay | Admitting: Orthopedic Surgery

## 2018-09-20 ENCOUNTER — Other Ambulatory Visit: Payer: Self-pay | Admitting: Orthopedic Surgery

## 2018-09-20 MED ORDER — ACETAMINOPHEN-CODEINE #3 300-30 MG PO TABS: 1.0000 | ORAL_TABLET | Freq: Four times a day (QID) | ORAL | 0 refills | Status: DC | PRN

## 2018-09-20 NOTE — Telephone Encounter (Signed)
Patient is asking for something for pain stronger than tramadol.    PATIENT USES [facility]

## 2018-09-20 NOTE — Telephone Encounter (Signed)
No other medicine prescribed

## 2018-09-22 ENCOUNTER — Ambulatory Visit (INDEPENDENT_AMBULATORY_CARE_PROVIDER_SITE_OTHER): Payer: Medicaid Other | Admitting: Orthopedic Surgery

## 2018-09-22 ENCOUNTER — Other Ambulatory Visit: Payer: Self-pay

## 2018-09-22 ENCOUNTER — Encounter: Payer: Self-pay | Admitting: Orthopedic Surgery

## 2018-09-22 VITALS — BP 113/65 | HR 86 | Ht 65.0 in | Wt 226.0 lb

## 2018-09-22 DIAGNOSIS — M67432 Ganglion, left wrist: Secondary | ICD-10-CM

## 2018-09-22 NOTE — Progress Notes (Signed)
Chief Complaint  Patient presents with  . Post-op Follow-up    09/07/18 c/o tenderness pain numbness    POD # 15  09/07/2018  PRE-OPERATIVE DIAGNOSIS:  ganglion cyst left wrist POST-OPERATIVE DIAGNOSIS:  ganglion cyst left wrist   PROCEDURE:  Procedure(s): REMOVAL GANGLION OF WRIST (Left)    Operative findings the ganglion cyst took origin from the radiocarpal joint with characteristic stalk arising from the wrist joint capsule   She complains of numbness in her index finger expected after the extensive dissection and the dissection around the superficial radial nerve  Her wound is clean the suture ends were removed from the absorbable suture  Follow-up in 6 weeks  Active range of motion as tolerated avoid heavy lifting  Encounter Diagnosis  Name Primary?  . Ganglion cyst of dorsum of left wrist s/p removal 09/07/18 Yes

## 2018-09-29 ENCOUNTER — Other Ambulatory Visit (HOSPITAL_COMMUNITY): Payer: Medicaid Other

## 2018-10-10 ENCOUNTER — Ambulatory Visit: Payer: Medicaid Other | Admitting: Sports Medicine

## 2018-10-10 IMAGING — DX DG CERVICAL SPINE 2 OR 3 VIEWS
3 series · 4 of 4 positions shown · non-contrast
Comparison: None.

CLINICAL DATA: Bilateral neck pain extending into the upper
shoulders and head. Headaches. Symptoms for 1 year, worse in the
past 6 months.

EXAM:
CERVICAL SPINE - 2-3 VIEW

[c-spine lat]
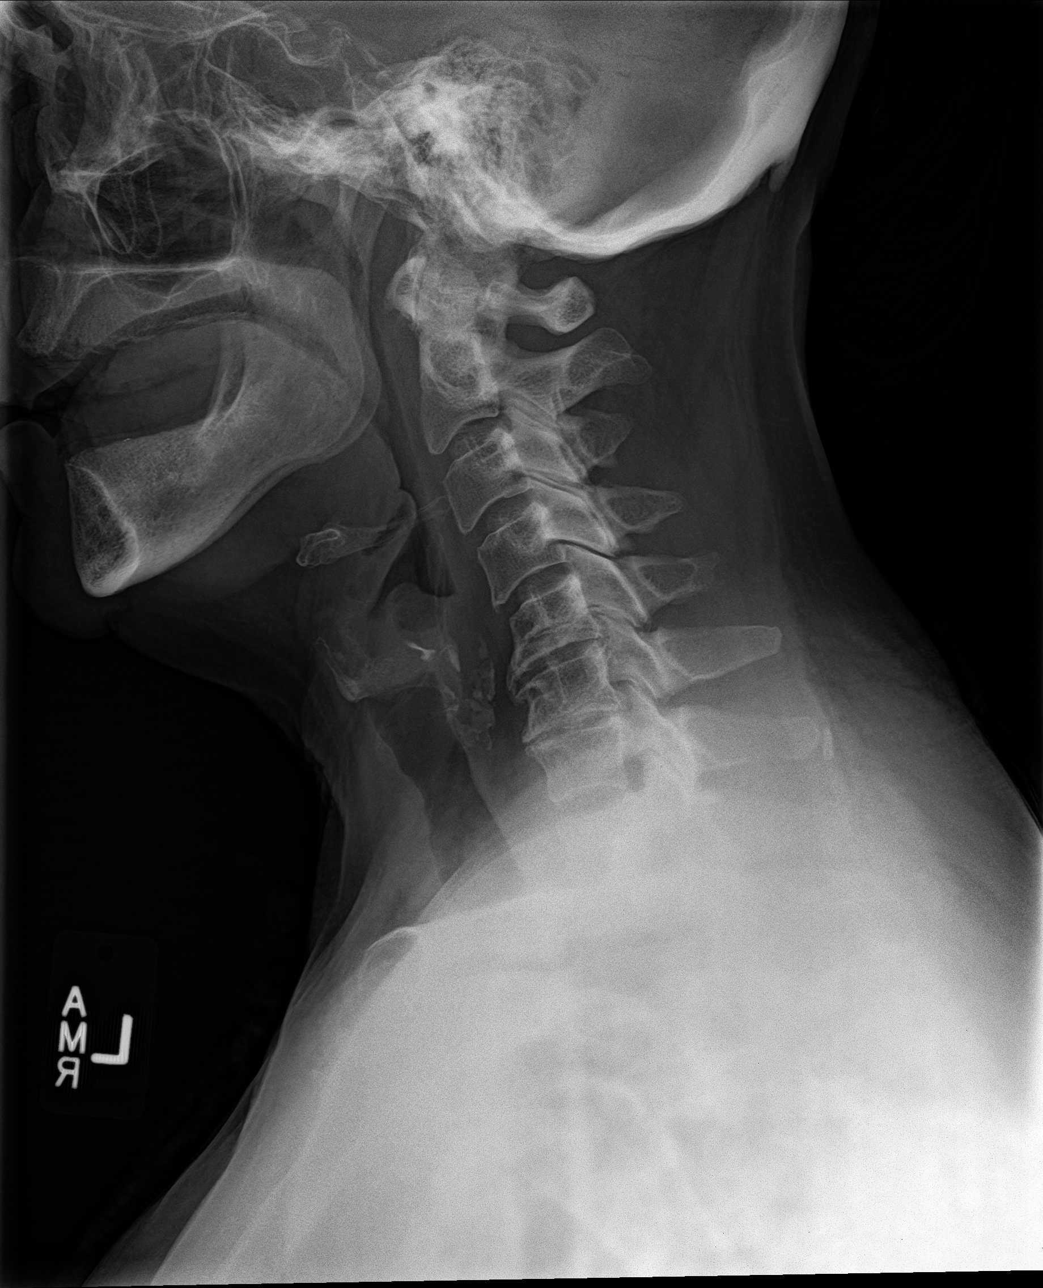

[c-spine ap]
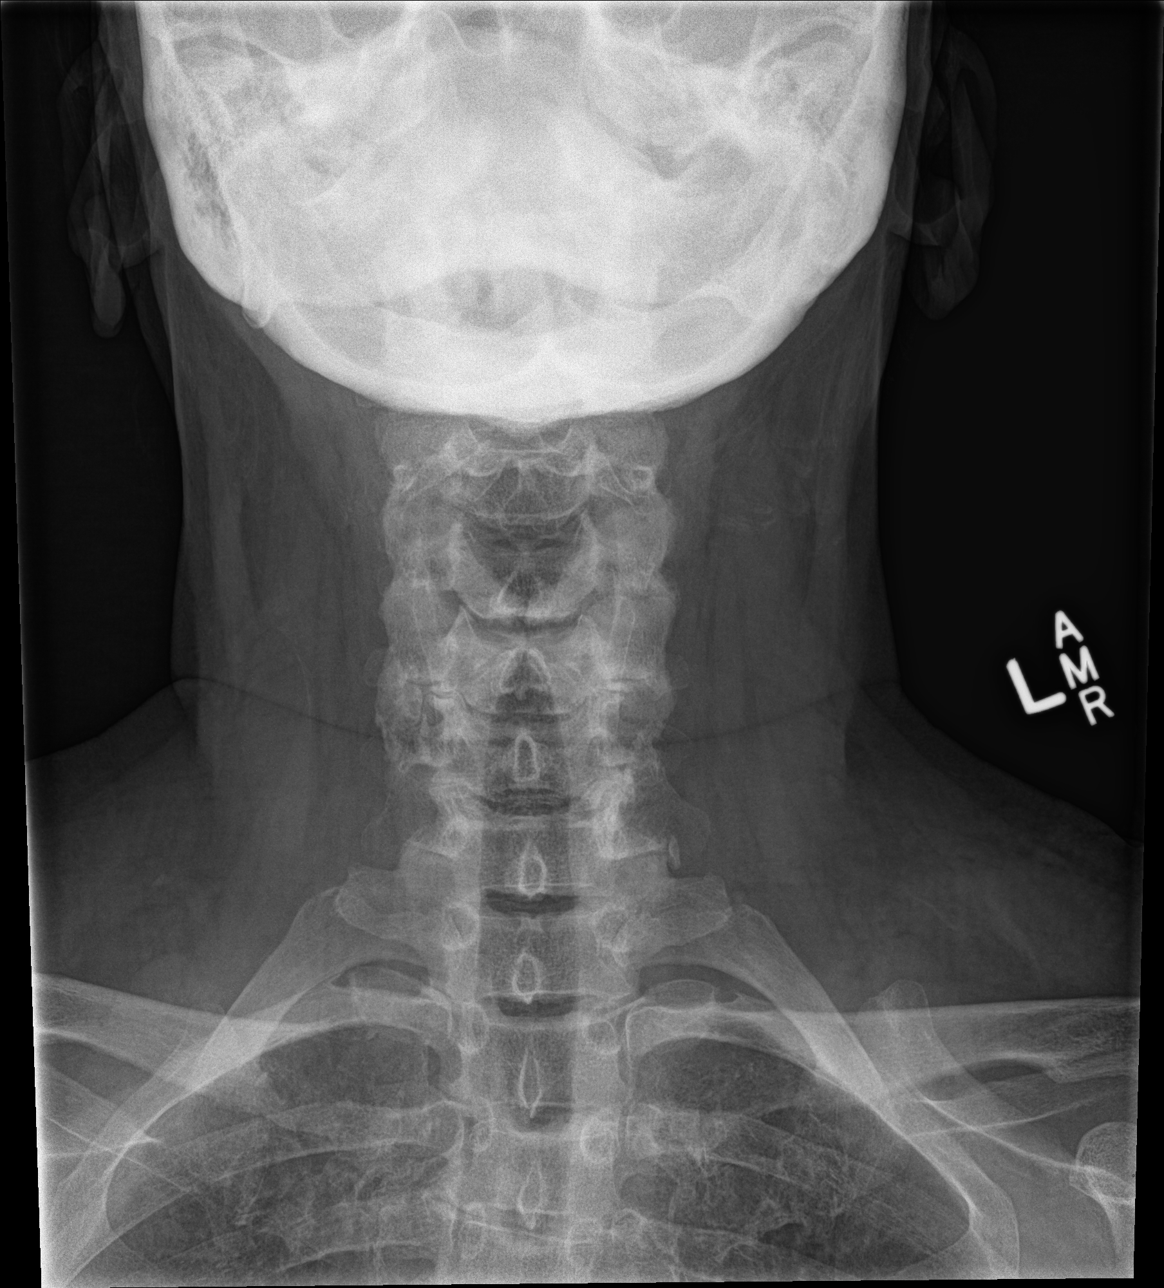

[Series 3: c-spine open mouth · 0.14mm/px · 2 of 2 slices shown]
[im 1/2]
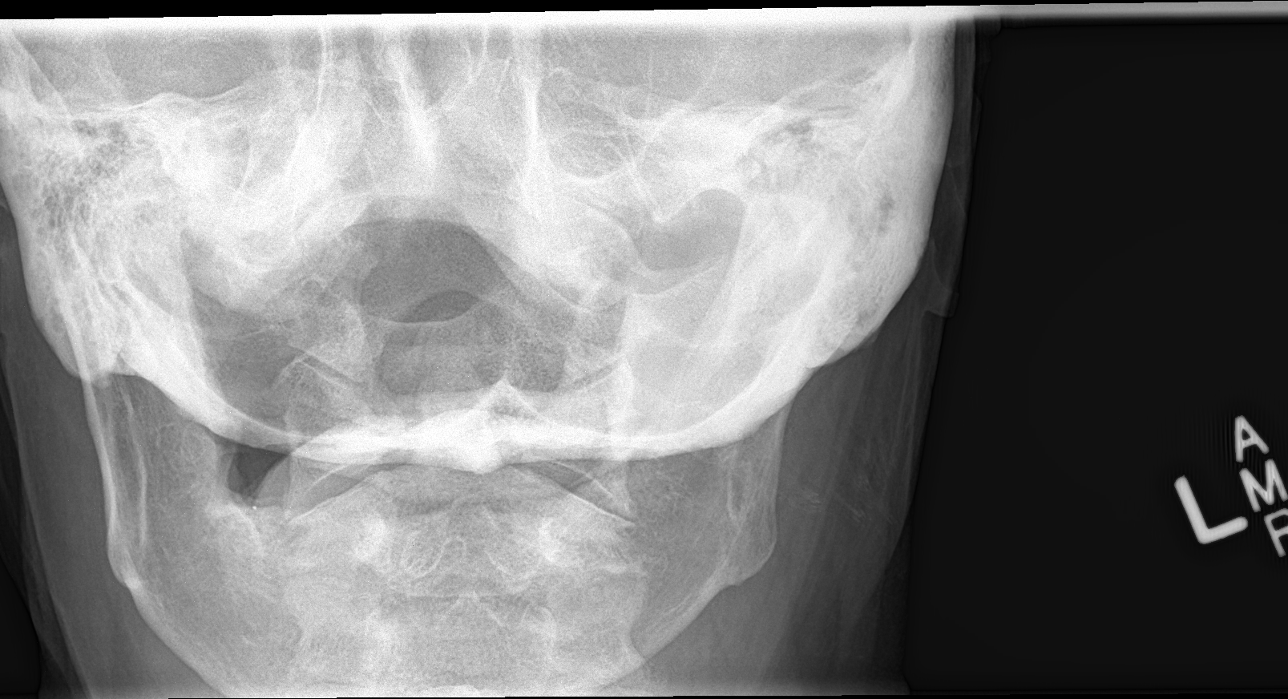
[im 2/2]
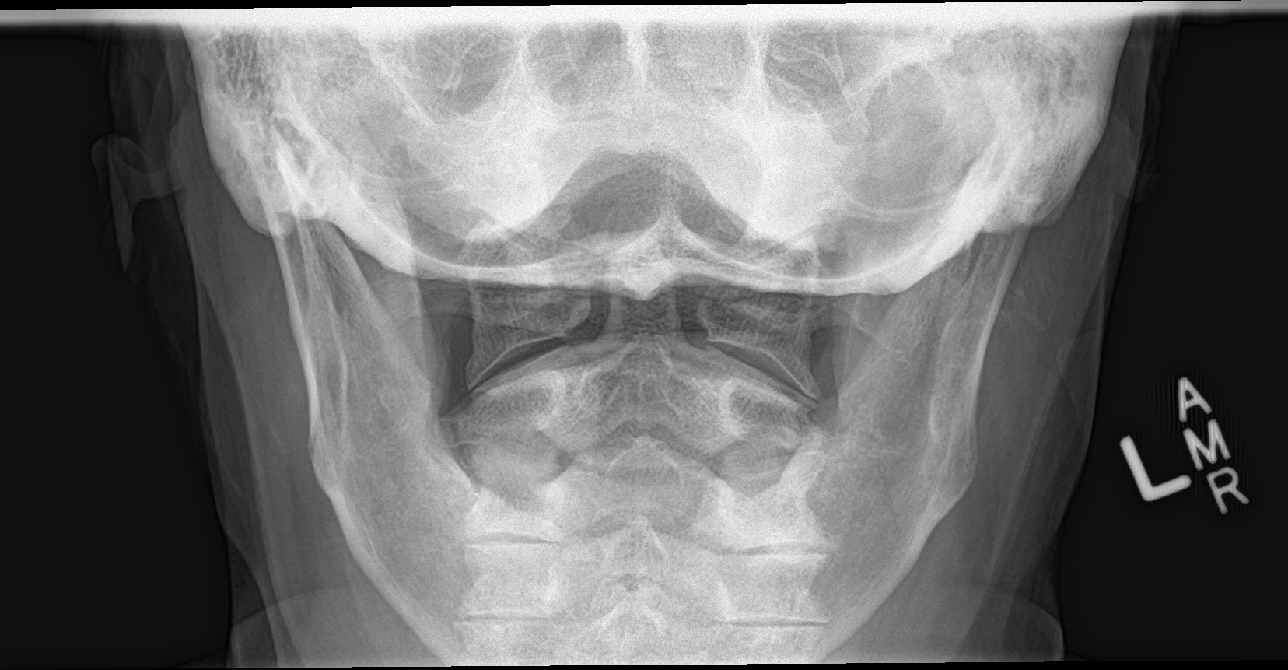

[4 of 4 positions shown; findings below may reference images not displayed]

FINDINGS: There is mild reversal the normal cervical lordosis. There is no
listhesis. Prevertebral soft tissues are within normal limits. At
C5-6, there is mild-to-moderate disc space narrowing and moderate
anterior spurring. Moderate disc space narrowing and mild spurring
are present at C6-7. No fracture is identified. The visualized lung
apices are clear.
IMPRESSION: Moderate disc degeneration at C5-6 and C6-7.

## 2018-10-16 ENCOUNTER — Other Ambulatory Visit: Payer: Self-pay | Admitting: Orthopedic Surgery

## 2018-10-16 ENCOUNTER — Telehealth: Payer: Self-pay | Admitting: Orthopedic Surgery

## 2018-10-16 NOTE — Telephone Encounter (Signed)
If the incision is not red, then she can have tylenol 3 and ibuprofen for pain

## 2018-10-16 NOTE — Telephone Encounter (Signed)
She states it is red, appointment is scheduled.

## 2018-10-16 NOTE — Telephone Encounter (Signed)
Patient called to relay that her hand/wrist is hurting, and states "it's still hard to use her hand" - patient is status/post surgery, ganglion cyst removal on 09/07/18. Please advise if virtual visit needed prior to next scheduled appointment 11/03/18. Ph# [phone].

## 2018-10-18 ENCOUNTER — Ambulatory Visit: Payer: Medicaid Other | Admitting: Orthopedic Surgery

## 2018-10-20 ENCOUNTER — Other Ambulatory Visit: Payer: Self-pay | Admitting: Orthopedic Surgery

## 2018-10-20 DIAGNOSIS — M170 Bilateral primary osteoarthritis of knee: Secondary | ICD-10-CM

## 2018-10-24 ENCOUNTER — Telehealth: Payer: Self-pay | Admitting: Orthopedic Surgery

## 2018-10-24 NOTE — Telephone Encounter (Signed)
Ok to RS if she is better

## 2018-10-24 NOTE — Telephone Encounter (Signed)
Patient called saying that her wrist wasn't hurting her as much and its just a little pink now. She is wanting to cancel tomorrow's appointment and keep her 5/1 appointment. I told her that I would send this message and get a reply before I canceled her appointment for tomorrow.  Please call and advise

## 2018-10-25 ENCOUNTER — Ambulatory Visit: Payer: Medicaid Other | Admitting: Orthopedic Surgery

## 2018-10-31 ENCOUNTER — Ambulatory Visit: Payer: Medicaid Other | Admitting: Dietician

## 2018-10-31 ENCOUNTER — Encounter: Payer: Medicaid Other | Attending: Family Medicine | Admitting: Nutrition

## 2018-10-31 ENCOUNTER — Other Ambulatory Visit: Payer: Self-pay

## 2018-10-31 ENCOUNTER — Telehealth: Payer: Self-pay | Admitting: Nutrition

## 2018-10-31 VITALS — Wt 234.0 lb

## 2018-10-31 DIAGNOSIS — E1165 Type 2 diabetes mellitus with hyperglycemia: Secondary | ICD-10-CM

## 2018-10-31 DIAGNOSIS — E118 Type 2 diabetes mellitus with unspecified complications: Secondary | ICD-10-CM | POA: Insufficient documentation

## 2018-10-31 DIAGNOSIS — E669 Obesity, unspecified: Secondary | ICD-10-CM | POA: Insufficient documentation

## 2018-10-31 DIAGNOSIS — IMO0002 Reserved for concepts with insufficient information to code with codable children: Secondary | ICD-10-CM

## 2018-10-31 NOTE — Patient Instructions (Addendum)
Goals Follow MY Plate Test blood sugars twice  A day  Eat 3 meals a day at times discussed Drink only water  Cut out snacks between meals Eat 2-3 carb choices per meal

## 2018-10-31 NOTE — Progress Notes (Signed)
Medical Nutrition Therapy:  Appt start time: 1330 end time:  1430.   Assessment:  Primary concerns today: Diabetes Type 2. DX a few months but had been prediabetic before that. Lives with her husband. Her husband does most of the cooking and the shopping. Has had 2 shots for her breathing  Issues and an antibiotic.  Wears Oxygen 24/7.  Sees Dr. Luan Pulling for her lung problems. . Metformin 500 mg BID. She complains of numbness in right leg for a week. . Its numb from her knee down.  She reports that her leg is cold and doesn't  have any feeling in it.  Meter: Accucheck [employer]. She notes the battery is dead and she cant get it to work. Is on Medicaid and should have the [place] for testing. Willing to test daily if her meter would work. Eat mainly 2 meals per day. Skips breakfast or lunch at times. Doesn't get hungry at times. Current diet is excessive in CHO related to inconsistent meals and lack of lower carb vegetables. Willing to make changes.  Lab Results  Component Value Date   HGBA1C 8.3 (H) 08/31/2018   CMP Latest Ref Rng & Units 08/31/2018 08/31/2017 08/30/2017  Glucose 70 - 99 mg/dL 111(H) 291(H) 316(H)  BUN 8 - 23 mg/dL 15 26(H) 25(H)  Creatinine 0.44 - 1.00 mg/dL 0.61 0.76 0.66  Sodium 135 - 145 mmol/L 137 129(L) 128(L)  Potassium 3.5 - 5.1 mmol/L 3.9 4.2 4.3  Chloride 98 - 111 mmol/L 101 90(L) 91(L)  CO2 22 - 32 mmol/L 27 30 28   Calcium 8.9 - 10.3 mg/dL 9.9 10.1 10.0  Total Protein 6.5 - 8.1 g/dL - - -  Total Bilirubin 0.3 - 1.2 mg/dL - - -  Alkaline Phos 38 - 126 U/L - - -  AST 15 - 41 U/L - - -  ALT 14 - 54 U/L - - -   Lipid Panel     Component Value Date/Time   CHOL 197 07/18/2015 1113   TRIG 144 07/18/2015 1113   HDL 41 07/18/2015 1113   CHOLHDL 4.8 (H) 07/18/2015 1113   CHOLHDL 3.1 03/07/2014 0343   VLDL 40 03/07/2014 0343   LDLCALC 127 (H) 07/18/2015 1113     Preferred Learning Style:     No preference indicated   Learning Readiness:      Ready  Change in progress   MEDICATIONS: see list   DIETARY INTAKE:  24-hr recall:  B ( AM): sausage, egg , Snk ( AM):  L ( PM): misc. Nabs or misc left overs. Diet soda Snk ( PM): fruit or nabs or cheese and crackers. D ( PM): Leftovers, meat, vegetable, Diet soda Snk ( PM): misc Beverages: Diet soda  Usual physical activity: ADL  Estimated energy needs: 1200  calories 135 g carbohydrates 90 g protein 33 g fat  Progress Towards Goal(s):  In progress.   Nutritional Diagnosis:  NB-1.1 Food and nutrition-related knowledge deficit As related to Diabetes Type 2.  As evidenced by A1C 8.3% . Marland Kitchen    Intervention:  Nutrition and Diabetes education provided on My Plate, CHO counting, meal planning, portion sizes, timing of meals, avoiding snacks between meals unless having a low blood sugar, target ranges for A1C and blood sugars, signs/symptoms and treatment of hyper/hypoglycemia, monitoring blood sugars, taking medications as prescribed, benefits of exercising 30 minutes per day and prevention of complications of DM. Goals Follow MY Plate Test blood sugars twice  A day  Eat 3 meals  a day at times discussed Drink only water  Cut out snacks between meals Eat 2-3 carb choices per meal  Teaching Method Utilized:  Visual Auditory Hands on  Handouts given during visit include:  Discussed the Plate Method    Barriers to learning/adherence to lifestyle change: none  Demonstrated degree of understanding via:  Teach Back   Monitoring/Evaluation:  Dietary intake, exercise, meal planning , and body weight in 1 month(s).

## 2018-10-31 NOTE — Telephone Encounter (Signed)
TC to patient to see if she contacted provider regarding concerns about her numbness in her right leg for the last week. She has barely any feeling in it. She notes her leg is cold to the touch she reports. Per her report, she has a history of blood clots.   She needs a for prescription for a Accucheck Guide meter. Pt. Notes she called  PCP office and they said someone would call her in the morning concerning an appt for her leg numbness and discuss the Accucheck Guide prescription..     She also reports that she doesn't want to see the PA that she has seen before. She feels she doesn't get good quality care from her. Due to her chronic medical problems she prefers to see Dr. Maudie Mercury or another MD.   Hasn't been able to test blood sugars due to battery of old meter not working. Due to Medicaid, she needs the Accucheck Guide meter.

## 2018-11-03 ENCOUNTER — Ambulatory Visit (INDEPENDENT_AMBULATORY_CARE_PROVIDER_SITE_OTHER): Payer: Medicaid Other | Admitting: Orthopedic Surgery

## 2018-11-03 ENCOUNTER — Other Ambulatory Visit: Payer: Self-pay

## 2018-11-03 ENCOUNTER — Encounter: Payer: Self-pay | Admitting: Orthopedic Surgery

## 2018-11-03 DIAGNOSIS — M67432 Ganglion, left wrist: Secondary | ICD-10-CM

## 2018-11-03 NOTE — Progress Notes (Signed)
Virtual Visit via Telephone Note  I connected with [name] on 11/03/18 at 10:50 AM EDT by telephone and verified that I am speaking with the correct person using two identifiers.  Location: Patient: HOME Provider: Vannie Hochstetler AT OFFICE   I discussed the limitations, risks, security and privacy concerns of performing an evaluation and management service by telephone and the availability of in person appointments. I also discussed with the patient that there may be a patient responsible charge related to this service. The patient expressed understanding and agreed to proceed. I discussed the assessment and treatment plan with the patient. The patient was provided an opportunity to ask questions and all were answered. The patient agreed with the plan and demonstrated an understanding of the instructions.   The patient was advised to call back or seek an in-person evaluation if the symptoms worsen or if the condition fails to improve as anticipated.  I provided 2 minutes of non-face-to-face time during this encounter.  Follow-up status post dorsal ganglion excision on March 5  She is complaining of a little stiffness but the erythema has resolved she is able to move it better  She does have a lung infection and wants to be seen for her shoulder.  She will call us when the infection has resolved for evaluation of new problem  Encounter Diagnosis  Name Primary?  . Ganglion cyst of dorsum of left wrist s/p removal 09/07/18 Yes     Arther Abbott, MD

## 2018-11-08 ENCOUNTER — Encounter: Payer: Self-pay | Admitting: Nutrition

## 2018-11-23 ENCOUNTER — Ambulatory Visit: Payer: Medicaid Other | Admitting: Nutrition
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
## 2019-01-23 ENCOUNTER — Ambulatory Visit: Payer: Medicaid Other | Admitting: Sports Medicine

## 2019-01-23 ENCOUNTER — Ambulatory Visit (INDEPENDENT_AMBULATORY_CARE_PROVIDER_SITE_OTHER): Payer: Medicaid Other

## 2019-01-23 ENCOUNTER — Other Ambulatory Visit: Payer: Self-pay | Admitting: Sports Medicine

## 2019-01-23 ENCOUNTER — Other Ambulatory Visit: Payer: Self-pay

## 2019-01-23 ENCOUNTER — Encounter: Payer: Self-pay | Admitting: Sports Medicine

## 2019-01-23 VITALS — Temp 98.1°F

## 2019-01-23 DIAGNOSIS — M1990 Unspecified osteoarthritis, unspecified site: Secondary | ICD-10-CM

## 2019-01-23 DIAGNOSIS — M67471 Ganglion, right ankle and foot: Secondary | ICD-10-CM

## 2019-01-23 DIAGNOSIS — M21619 Bunion of unspecified foot: Secondary | ICD-10-CM

## 2019-01-23 DIAGNOSIS — M79671 Pain in right foot: Secondary | ICD-10-CM

## 2019-01-23 DIAGNOSIS — M21611 Bunion of right foot: Secondary | ICD-10-CM | POA: Diagnosis not present

## 2019-01-23 DIAGNOSIS — M6740 Ganglion, unspecified site: Secondary | ICD-10-CM

## 2019-01-23 MED ORDER — TRIAMCINOLONE ACETONIDE 10 MG/ML IJ SUSP: 10.0000 mg | Freq: Once | INTRAMUSCULAR | Status: DC

## 2019-01-23 NOTE — Progress Notes (Signed)
Subjective: [name] is a 63 y.o. female patient who presents to office for evaluation of right foot pain. Patient complains of progressive pain especially over the bump at the right great toe joint patient reports that pain has been present off and on for several weeks with the last 2 weeks being the worst very difficult to wear shoes due to the pressure over the big toe joint.  Patient reports that she knows that she has arthritis in the big toe from our last visit together last year however has had a bunch of health issues and has been unable to come to office for follow-up currently is on oxygen due to history of repeat pneumonia and difficulty breathing.  Patient reports that she was tested for COVID and has been negative.  Patient is assisted by husband at this visit. Patient denies any other pedal complaints. Denies injury/trip/fall/sprain/any causative factors.   Patient Active Problem List   Diagnosis Date Noted  . Ganglion cyst of dorsum of left wrist s/p removal 09/07/18   . DM (diabetes mellitus), type 2 ([place]) 08/30/2017  . Respiratory bronchiolitis associated interstitial lung disease ([place]) 08/30/2017  . Hyperglycemia 08/26/2017  . Chronic pain 08/25/2017  . COPD with acute exacerbation ([place]) 08/25/2017  . Alcohol abuse 08/01/2015  . Anorgasmia of female 01/01/2015  . Low grade squamous intraepithelial lesion (LGSIL) on Papanicolaou smear of cervix 12/04/2014  . Abdominal pain, chronic, epigastric 11/20/2014  . Dysphagia, pharyngoesophageal phase   . Hx of colonic polyps   . COPD exacerbation ([place]) 07/05/2014  . GERD without esophagitis 07/05/2014  . Hypertension 07/05/2014  . Bipolar 1 disorder ([place]) 07/05/2014  . Anxiety 07/05/2014  . Polysubstance abuse ([place]) 07/05/2014  . Obesity (BMI 30-39.9) 07/05/2014  . Encephalopathy, metabolic 93/81/0175  . Personal history of colonic polyps 04/01/2014  . Mild dysplasia of cervix 11/13/2013  . Acute respiratory failure  with hypoxia ([place]) 07/08/2013  . Benzodiazepine dependence ([place]) 08/26/2012  . COPD (chronic obstructive pulmonary disease) ([place]) 08/25/2012  . Panic disorder 08/25/2012  . Tobacco use disorder 08/25/2012  . Hyperlipidemia 08/25/2012    Current Outpatient Medications on File Prior to Visit  Medication Sig Dispense Refill  . acetaminophen-codeine (TYLENOL #3) 300-30 MG tablet Take 1 tablet by mouth every 6 (six) hours as needed for moderate pain. 30 tablet 0  . albuterol (PROVENTIL HFA;VENTOLIN HFA) 108 (90 Base) MCG/ACT inhaler Inhale 2 puffs into the lungs every 6 (six) hours as needed for wheezing or shortness of breath.    Marland Kitchen albuterol (PROVENTIL) (2.5 MG/3ML) 0.083% nebulizer solution Take 2.5 mg by nebulization every 4 (four) hours as needed for wheezing or shortness of breath.     Marland Kitchen albuterol-ipratropium (COMBIVENT) 18-103 MCG/ACT inhaler Inhale 2 puffs into the lungs 4 (four) times daily.     . budesonide-formoterol (SYMBICORT) 160-4.5 MCG/ACT inhaler Inhale 2 puffs into the lungs 2 (two) times daily as needed (shortness of breath).     . cycloSPORINE (RESTASIS) 0.05 % ophthalmic emulsion Place 1 drop into both eyes 2 (two) times daily.    Marland Kitchen esomeprazole (NEXIUM) 20 MG capsule Take 20 mg by mouth daily at 12 noon.    . fluticasone (FLONASE) 50 MCG/ACT nasal spray Place 1 spray into both nostrils 2 (two) times daily as needed for allergies or rhinitis. 16 g 6  . lisinopril (PRINIVIL,ZESTRIL) 10 MG tablet Take 10 mg by mouth daily.    Marland Kitchen lovastatin (MEVACOR) 40 MG tablet Take 40 mg by mouth at bedtime.    Marland Kitchen  nitroGLYCERIN (NITROSTAT) 0.4 MG SL tablet DISSOLVE 1 TABLET UNDER THE TONGUE EVERY 5 MINUTES IF NEEDED FOR CHEST PAIN. MAX 3 DOSES THEN CALL 911. (Patient taking differently: Place 0.4 mg under the tongue every 5 (five) minutes as needed for chest pain. ) 25 tablet 3  . OXYGEN Inhale 4 L into the lungs continuous.    Marland Kitchen rOPINIRole (REQUIP) 1 MG tablet Take 1 mg by mouth 3 (three) times  daily.     . traMADol-acetaminophen (ULTRACET) 37.5-325 MG tablet TAKE 1 TABLET BY MOUTH EVERY 6 HOURS AS NEEDED 28 tablet 0  . metFORMIN (GLUCOPHAGE) 500 MG tablet Take 1 tablet (500 mg total) by mouth 2 (two) times daily with a meal. 60 tablet 0   No current facility-administered medications on file prior to visit.     Allergies  Allergen Reactions  . Penicillins Other (See Comments)    Patient is unsure if she allergic to penicillin or septra. Patient states one or another caused "rib pain with a little breathing problem". Has patient had a PCN reaction causing immediate rash, facial/tongue/throat swelling, SOB or lightheadedness with hypotension: YES Has patient had a PCN reaction causing severe rash involving mucus membranes or skin necrosis: NO Has patient had a PCN reaction that required hospitalization: NO Has patient had a PCN reaction occurring within the last 10 years: NO  . Septra [Sulfamethoxazole-Trimethoprim] Other (See Comments)    Patient is unsure if she allergic to septra or penicillin. Patient states one or another caused "rib pain with a little breathing problem".    Objective:  General: Alert and oriented x3 in no acute distress  Dermatology: There is a raised fluctuant soft tissue mass at the dorsal first metatarsal phalangeal joint that measures less than 1 cm translucent consistent with cyst over the right joint.  There is no warmth no erythema to this area.  Vascular: Dorsalis Pedis and Posterior Tibial pedal pulses faintly palpable, Capillary Fill Time 5 seconds,scant pedal hair growth bilateral, Temperature gradient within normal limits.  Neurology: Johney Maine sensation intact via light touch bilateral.  Protective sharp shooting burning pains of both feet and legs and knees and pain that is coming from her left hip that radiates down to her foot and pain in the groin.  Musculoskeletal: Mild tenderness with palpation at cyst over the right big toe joint with  significant limited range of motion and dorsal bone spurs consistent with hallux rigidus at the right greater than left big toe joint with a symptomatic tailor's bunion..   Gait: Antalgic gait oxygen tank assisted  Xrays  Right foot   Impression: Normal osseous mineralization, there is significant end-stage arthritis at the first metatarsophalangeal joint there is mild increase of the fourth fifth intermetatarsal angle consistent with tailor's bunion deformity. No other symptoms.   Assessment and Plan: Problem List Items Addressed This Visit    None    Visit Diagnoses    Ganglion    -  Primary   Relevant Medications   triamcinolone acetonide (KENALOG) 10 MG/ML injection 10 mg   Right foot pain       Relevant Orders   DG Foot Complete Right   Bunion       Arthritis       Relevant Medications   triamcinolone acetonide (KENALOG) 10 MG/ML injection 10 mg       -Complete examination performed -Xrays reviewed -Discussed treatement options cyst at right 1st toe joint; After verbal consent for aspiration of cyst at right first metatarsophalangeal joint  local anesthetic was administered in a local field block fashion using 3 cc of one-to-one mixture of 1% lidocaine and 0.5% Marcaine plain.  Once anesthesia was confirmed then utilizing a 18-gauge needle and a 3 cc syringe approximately half a cc of gelatinous bloody drainage was evacuated from the right first metatarsophalangeal joint at the area of cyst with immediate reduction of the size of the cystic lesion.  Following 0.5 cc of Kenalog was injected into the area.  The area was then dressed with antibiotic cream and a Coban compressive dressing and advised patient to dress the area with the same at least for the next week to assist with keeping pressure to the area and reducing the cyst from reaccumulating.  Advised patient to rest ice elevate and may take Tylenol as needed for any pain to the area.  Patient tolerated the aspiration of the  ganglion cyst well without complication. -Advised patient to follow-up with PCP regarding burning pain and her left hip and groin pain -Patient to return to office as needed or sooner if condition worsens.  Landis Martins, DPM

## 2019-01-31 ENCOUNTER — Other Ambulatory Visit: Payer: Self-pay

## 2019-01-31 ENCOUNTER — Other Ambulatory Visit: Payer: Medicaid Other | Admitting: Adult Health

## 2019-01-31 ENCOUNTER — Other Ambulatory Visit (HOSPITAL_COMMUNITY): Payer: Self-pay | Admitting: Adult Health

## 2019-01-31 ENCOUNTER — Ambulatory Visit (HOSPITAL_COMMUNITY)
Admission: RE | Admit: 2019-01-31 | Discharge: 2019-01-31 | Disposition: A | Discharge status: Home / Self Care | Payer: Medicaid Other | Source: Ambulatory Visit | Attending: Adult Health | Admitting: Adult Health

## 2019-01-31 DIAGNOSIS — M545 Low back pain, unspecified: Secondary | ICD-10-CM

## 2019-02-07 DIAGNOSIS — M5440 Lumbago with sciatica, unspecified side: Secondary | ICD-10-CM | POA: Insufficient documentation

## 2019-02-07 DIAGNOSIS — G629 Polyneuropathy, unspecified: Secondary | ICD-10-CM | POA: Insufficient documentation

## 2019-03-13 ENCOUNTER — Encounter: Payer: Self-pay | Admitting: Sports Medicine

## 2019-03-13 ENCOUNTER — Other Ambulatory Visit: Payer: Self-pay

## 2019-03-13 ENCOUNTER — Ambulatory Visit: Payer: Medicaid Other | Admitting: Sports Medicine

## 2019-03-13 DIAGNOSIS — M6740 Ganglion, unspecified site: Secondary | ICD-10-CM

## 2019-03-13 DIAGNOSIS — M2021 Hallux rigidus, right foot: Secondary | ICD-10-CM

## 2019-03-13 DIAGNOSIS — M21619 Bunion of unspecified foot: Secondary | ICD-10-CM

## 2019-03-13 DIAGNOSIS — M1990 Unspecified osteoarthritis, unspecified site: Secondary | ICD-10-CM

## 2019-03-13 DIAGNOSIS — D492 Neoplasm of unspecified behavior of bone, soft tissue, and skin: Secondary | ICD-10-CM

## 2019-03-13 DIAGNOSIS — M79671 Pain in right foot: Secondary | ICD-10-CM

## 2019-03-13 NOTE — Progress Notes (Signed)
Subjective: [name] is a 63 y.o. female patient who returns to office for follow up evaluation of right foot pain. Patient reports that for a few days after the last aspiration he went down but then as soon as she stopped wearing the compressive Coban wrap it slowly build back up states that pain is off on 5 out of 10 over the big toe joint with intermittent aching denies any significant warmth redness or any drainage reports that she has been using topical BenGay to the area and taking Tylenol as needed.  Patient is diabetic with last A1c recorded at 8.3 does not routinely keep check of her blood sugars.  Patient denies any changes with medications or medical history since last encounter.   Patient Active Problem List   Diagnosis Date Noted  . Ganglion cyst of dorsum of left wrist s/p removal 09/07/18   . DM (diabetes mellitus), type 2 ([place]) 08/30/2017  . Respiratory bronchiolitis associated interstitial lung disease ([place]) 08/30/2017  . Hyperglycemia 08/26/2017  . Chronic pain 08/25/2017  . COPD with acute exacerbation ([place]) 08/25/2017  . Alcohol abuse 08/01/2015  . Anorgasmia of female 01/01/2015  . Low grade squamous intraepithelial lesion (LGSIL) on Papanicolaou smear of cervix 12/04/2014  . Abdominal pain, chronic, epigastric 11/20/2014  . Dysphagia, pharyngoesophageal phase   . Hx of colonic polyps   . COPD exacerbation ([place]) 07/05/2014  . GERD without esophagitis 07/05/2014  . Hypertension 07/05/2014  . Bipolar 1 disorder ([place]) 07/05/2014  . Anxiety 07/05/2014  . Polysubstance abuse ([place]) 07/05/2014  . Obesity (BMI 30-39.9) 07/05/2014  . Encephalopathy, metabolic 38/75/6433  . Personal history of colonic polyps 04/01/2014  . Mild dysplasia of cervix 11/13/2013  . Acute respiratory failure with hypoxia ([place]) 07/08/2013  . Benzodiazepine dependence ([place]) 08/26/2012  . COPD (chronic obstructive pulmonary disease) ([place]) 08/25/2012  . Panic disorder 08/25/2012  .  Tobacco use disorder 08/25/2012  . Hyperlipidemia 08/25/2012    Current Outpatient Medications on File Prior to Visit  Medication Sig Dispense Refill  . acetaminophen-codeine (TYLENOL #3) 300-30 MG tablet Take 1 tablet by mouth every 6 (six) hours as needed for moderate pain. 30 tablet 0  . albuterol (PROVENTIL HFA;VENTOLIN HFA) 108 (90 Base) MCG/ACT inhaler Inhale 2 puffs into the lungs every 6 (six) hours as needed for wheezing or shortness of breath.    Marland Kitchen albuterol (PROVENTIL) (2.5 MG/3ML) 0.083% nebulizer solution Take 2.5 mg by nebulization every 4 (four) hours as needed for wheezing or shortness of breath.     Marland Kitchen albuterol-ipratropium (COMBIVENT) 18-103 MCG/ACT inhaler Inhale 2 puffs into the lungs 4 (four) times daily.     . budesonide-formoterol (SYMBICORT) 160-4.5 MCG/ACT inhaler Inhale 2 puffs into the lungs 2 (two) times daily as needed (shortness of breath).     . cycloSPORINE (RESTASIS) 0.05 % ophthalmic emulsion Place 1 drop into both eyes 2 (two) times daily.    Marland Kitchen esomeprazole (NEXIUM) 20 MG capsule Take 20 mg by mouth daily at 12 noon.    . fluticasone (FLONASE) 50 MCG/ACT nasal spray Place 1 spray into both nostrils 2 (two) times daily as needed for allergies or rhinitis. 16 g 6  . lisinopril (PRINIVIL,ZESTRIL) 10 MG tablet Take 10 mg by mouth daily.    Marland Kitchen lovastatin (MEVACOR) 40 MG tablet Take 40 mg by mouth at bedtime.    . metFORMIN (GLUCOPHAGE) 500 MG tablet Take 1 tablet (500 mg total) by mouth 2 (two) times daily with a meal. 60 tablet 0  .  nitroGLYCERIN (NITROSTAT) 0.4 MG SL tablet DISSOLVE 1 TABLET UNDER THE TONGUE EVERY 5 MINUTES IF NEEDED FOR CHEST PAIN. MAX 3 DOSES THEN CALL 911. (Patient taking differently: Place 0.4 mg under the tongue every 5 (five) minutes as needed for chest pain. ) 25 tablet 3  . OXYGEN Inhale 4 L into the lungs continuous.    Marland Kitchen rOPINIRole (REQUIP) 1 MG tablet Take 1 mg by mouth 3 (three) times daily.     . traMADol-acetaminophen (ULTRACET)  37.5-325 MG tablet TAKE 1 TABLET BY MOUTH EVERY 6 HOURS AS NEEDED 28 tablet 0   Current Facility-Administered Medications on File Prior to Visit  Medication Dose Route Frequency Provider Last Rate Last Dose  . triamcinolone acetonide (KENALOG) 10 MG/ML injection 10 mg  10 mg Other Once Landis Martins, DPM        Allergies  Allergen Reactions  . Penicillins Other (See Comments)    Patient is unsure if she allergic to penicillin or septra. Patient states one or another caused "rib pain with a little breathing problem". Has patient had a PCN reaction causing immediate rash, facial/tongue/throat swelling, SOB or lightheadedness with hypotension: YES Has patient had a PCN reaction causing severe rash involving mucus membranes or skin necrosis: NO Has patient had a PCN reaction that required hospitalization: NO Has patient had a PCN reaction occurring within the last 10 years: NO  . Septra [Sulfamethoxazole-Trimethoprim] Other (See Comments)    Patient is unsure if she allergic to septra or penicillin. Patient states one or another caused "rib pain with a little breathing problem".    Objective:  General: Alert and oriented x3 in no acute distress  Dermatology: There is a raised fluctuant soft tissue mass at the dorsal first metatarsal phalangeal joint that measures less than 1 cm translucent consistent with cyst over the right joint like previous.  There is no warmth no erythema to this area.  Clinical pictures in chart.  Vascular: Dorsalis Pedis and Posterior Tibial pedal pulses faintly palpable, Capillary Fill Time 5 seconds,scant pedal hair growth bilateral, Temperature gradient within normal limits.  Neurology: Johney Maine sensation intact via light touch bilateral.  Protective sharp shooting burning pains of both feet and legs and knees and pain that is coming from her left hip that radiates down to her foot and pain in the groin.  Musculoskeletal: Mild tenderness with palpation at cyst over  the right big toe joint with significant limited range of motion and dorsal bone spurs consistent with hallux rigidus at the right greater than left big toe joint with a symptomatic tailor's bunion..   Gait: Antalgic gait oxygen tank assisted  Xrays  Right foot   Impression: Normal osseous mineralization, there is significant end-stage arthritis at the first metatarsophalangeal joint there is mild increase of the fourth fifth intermetatarsal angle consistent with tailor's bunion deformity. No other symptoms.   Assessment and Plan: Problem List Items Addressed This Visit    None    Visit Diagnoses    Ganglion    -  Primary   Arthritis       Bunion       Hallux rigidus of right foot       Right foot pain           -Complete examination performed -Xrays reviewed -Discussed treatement options cyst at right 1st toe joint; After verbal consent forre-aspiration of cyst and steroid injection at right first metatarsophalangeal joint local anesthetic was administered in a local field block fashion using 3 cc  of one-to-one mixture of 1% lidocaine and 0.5% Marcaine plain.  Once anesthesia was confirmed then utilizing a 18-gauge needle and a 3 cc syringe approximately 0.75cc of gelatinous bloody drainage was evacuated from the right first metatarsophalangeal joint at the area of cyst with immediate reduction of the size of the cystic lesion.  Following 0.5 cc of Kenalog was injected into the area.  The area was then dressed with antibiotic cream and a Coban compressive dressing and advised patient to dress the area with the same at least for the next week to assist with keeping pressure to the area and reducing the cyst from re-accumulating like before. This is aspiration #2 to area. - Advised patient to rest ice elevate and may take Tylenol as needed for any pain to the area.  Patient tolerated the aspiration of the ganglion cyst well without complication. -Rx MRI and advised patient since cyst or mass  has come back in less in 1 month the next step would be MRI to plan for surgery excision of cyst with aggressive cheilectomy versus excision of cyst with joint implant depending on the extent of arthritis on MRI -Patient to return to office after MRI or sooner if condition worsens.  Landis Martins, DPM

## 2019-03-13 NOTE — Progress Notes (Signed)
Photos uploaded.

## 2019-03-14 ENCOUNTER — Telehealth: Payer: Self-pay | Admitting: *Deleted

## 2019-03-14 DIAGNOSIS — M6740 Ganglion, unspecified site: Secondary | ICD-10-CM

## 2019-03-14 DIAGNOSIS — M79671 Pain in right foot: Secondary | ICD-10-CM

## 2019-03-14 NOTE — Telephone Encounter (Signed)
-----   Message from Landis Martins, [place] sent at 03/13/2019  8:02 PM EDT ----- Regarding: MRI R Foot Eval extent of mass/cyst at  R 1st MTPJ. Need for surgery planning -Dr. Cannon Kettle

## 2019-03-23 NOTE — Telephone Encounter (Signed)
Let patient know that request was denied. If she wants surgery we can proceed with planning it. She will have to come in for surgery consult (her consent will have to be general to include different possible procedures since I will not know how bad the cyst and arthritis is at the joint is until I open her up during surgery). -Dr. Cannon Kettle

## 2019-03-23 NOTE — Telephone Encounter (Signed)
03/21/2019 [employer] denied and I faxed to Dr. Cannon Kettle for advise.

## 2019-03-26 NOTE — Telephone Encounter (Signed)
I informed pt of Dr. Leeanne Rio review of the [employer] denial and read the [employer] denial to pt concerning the ganglion. Pt states understanding and would like to schedule an appt to discuss surgery.

## 2019-03-26 NOTE — Telephone Encounter (Signed)
Scheduled on Tuesday, 10/20 at 10:45 am.

## 2019-03-28 NOTE — Telephone Encounter (Signed)
Pt called for the location of the MRI. I spoke with pt and reminded that the MRI had not been approved by her insurance and if she had the MRI performed she would have to self pay. I told pt she should call [facility] [phone] to cancel the MRI, I could not cancel for her. I reminded pt of the 04/24/2019 10:45am appt with Dr. Cannon Kettle.

## 2019-04-03 ENCOUNTER — Ambulatory Visit (HOSPITAL_COMMUNITY): Payer: Medicaid Other

## 2019-04-16 ENCOUNTER — Telehealth: Payer: Self-pay | Admitting: Adult Health

## 2019-04-16 NOTE — Telephone Encounter (Signed)

## 2019-04-17 ENCOUNTER — Other Ambulatory Visit: Payer: Medicaid Other | Admitting: Adult Health

## 2019-04-24 ENCOUNTER — Ambulatory Visit: Payer: Medicaid Other | Admitting: Sports Medicine

## 2019-04-25 ENCOUNTER — Telehealth: Payer: Self-pay | Admitting: *Deleted

## 2019-04-25 NOTE — Telephone Encounter (Signed)
"  I think I have an appointment tomorrow but I thought Dr. Cannon Kettle told me not to come in until I found out about this surgery on my foot.  If you would, give me a call back."

## 2019-04-26 NOTE — Telephone Encounter (Signed)
I am returning your call.  "I think I had an appointment scheduled with Dr. Cannon Kettle on October 20 but I didn't think she wanted me to come back until I had surgery."  She wanted you to come in so she could check the area on your foot.  She wanted to see how it's doing.  "It's still about the same."  Since it hasn't gotten better, she wanted you to have a MRI. "Yes, she did mention that.  I still need to have that done."  I can send you to [facility] voicemail and you can leave her a message regarding the MRI.  "Okay, that will be good because I really need to get this done."  I transferred her to [employer].

## 2019-04-26 NOTE — Telephone Encounter (Signed)
Her previous MRI was denied. Patient is supposed to see me for a surgery consult. We can proceed with doing the surgery even though her insurance does not cover a MRI. I will make the consent very general that will cover me cutting out the cyst/arthritis and putting in an implant since she could not get a MRI to help me with planning her surgery beforehand because it has been denied -Dr Chauncey Cruel

## 2019-04-27 NOTE — Telephone Encounter (Signed)
I informed pt of Dr. Leeanne Rio 04/26/2019 5:32pm recommendations. Pt states understanding and I told her I would have a scheduler contact her for a consultation appt.

## 2019-05-14 ENCOUNTER — Other Ambulatory Visit: Payer: Self-pay

## 2019-05-14 ENCOUNTER — Encounter: Payer: Self-pay | Admitting: Cardiology

## 2019-05-14 ENCOUNTER — Ambulatory Visit (INDEPENDENT_AMBULATORY_CARE_PROVIDER_SITE_OTHER): Payer: Medicaid Other | Admitting: Cardiology

## 2019-05-14 ENCOUNTER — Telehealth: Payer: Self-pay | Admitting: Cardiology

## 2019-05-14 VITALS — BP 142/74 | HR 92 | Temp 97.1°F | Ht 64.0 in | Wt 218.0 lb

## 2019-05-14 DIAGNOSIS — I5032 Chronic diastolic (congestive) heart failure: Secondary | ICD-10-CM

## 2019-05-14 DIAGNOSIS — M79604 Pain in right leg: Secondary | ICD-10-CM

## 2019-05-14 DIAGNOSIS — R0602 Shortness of breath: Secondary | ICD-10-CM

## 2019-05-14 DIAGNOSIS — M79605 Pain in left leg: Secondary | ICD-10-CM | POA: Diagnosis not present

## 2019-05-14 MED ORDER — TORSEMIDE 20 MG PO TABS: ORAL_TABLET | ORAL | 3 refills | Status: DC

## 2019-05-14 NOTE — Patient Instructions (Signed)
Medication Instructions:  STOP LASIX  START TORSEMIDE 20 MG DAILY  *MAY TAKE AN EXTRA 20 MG DAILY AS NEEDED FOR EXTREME SWELLING   Labwork: NONE  Testing/Procedures: Your physician has requested that you have an echocardiogram. Echocardiography is a painless test that uses sound waves to create images of your heart. It provides your doctor with information about the size and shape of your heart and how well your heart's chambers and valves are working. This procedure takes approximately one hour. There are no restrictions for this procedure.  Your physician has requested that you have an ankle brachial index (ABI). During this test an ultrasound and blood pressure cuff are used to evaluate the arteries that supply the arms and legs with blood. Allow thirty minutes for this exam. There are no restrictions or special instructions.    Follow-Up: Your physician recommends that you schedule a follow-up appointment in: 3 WEEKS    Any Other Special Instructions Will Be Listed Below (If Applicable).     If you need a refill on your cardiac medications before your next appointment, please call your pharmacy.

## 2019-05-14 NOTE — Progress Notes (Signed)
Clinical Summary [name] is a 63 y.o.female seen as new consult, referred by Dr Luan Pulling for SOB and LE edema  Previously followed by Dr Einar Gip. We had seen her during a hospital in admission in 2015   1. Chronic diastolic HF/Leg edema - 0/1751 LVEF 60-65%, grade II diastolic dysfunction, normal RV 2015 exercise stress test negative for ischemia - had been on lasix 40mg  daily, pcp increased to 80mg    - with increase in lasix some improvement. Has actually been losing weight over the last several months, difficultly eating. - increased SOB with the swelling.  - cutting back on sodium   2. Leg pains - reports chronic sciatica pain - she also reports a separate pain in bilateral calves with walking better with rest - long smoking history  3. CAD by prior imaging - incidental findings by prior chest CT - prior stress test 2105 without ischemia  4. COPD - management per pcp  Past Medical History:  Diagnosis Date  . Anxiety   . Arthritis   . Bipolar 1 disorder ([place])   . COPD (chronic obstructive pulmonary disease) ([place])   . Depression   . Diabetes mellitus without complication ([place])   . Emphysema of lung ([place])   . GERD (gastroesophageal reflux disease)   . History of kidney stones   . Hyperlipidemia   . Hypertension   . Pneumonia 2015  . Vaginal Pap smear, abnormal      Allergies  Allergen Reactions  . Penicillins Other (See Comments)    Patient is unsure if she allergic to penicillin or septra. Patient states one or another caused "rib pain with a little breathing problem". Has patient had a PCN reaction causing immediate rash, facial/tongue/throat swelling, SOB or lightheadedness with hypotension: YES Has patient had a PCN reaction causing severe rash involving mucus membranes or skin necrosis: NO Has patient had a PCN reaction that required hospitalization: NO Has patient had a PCN reaction occurring within the last 10 years: NO  . Septra  [Sulfamethoxazole-Trimethoprim] Other (See Comments)    Patient is unsure if she allergic to septra or penicillin. Patient states one or another caused "rib pain with a little breathing problem".     Current Outpatient Medications  Medication Sig Dispense Refill  . acetaminophen-codeine (TYLENOL #3) 300-30 MG tablet Take 1 tablet by mouth every 6 (six) hours as needed for moderate pain. 30 tablet 0  . albuterol (PROVENTIL HFA;VENTOLIN HFA) 108 (90 Base) MCG/ACT inhaler Inhale 2 puffs into the lungs every 6 (six) hours as needed for wheezing or shortness of breath.    Marland Kitchen albuterol (PROVENTIL) (2.5 MG/3ML) 0.083% nebulizer solution Take 2.5 mg by nebulization every 4 (four) hours as needed for wheezing or shortness of breath.     Marland Kitchen albuterol-ipratropium (COMBIVENT) 18-103 MCG/ACT inhaler Inhale 2 puffs into the lungs 4 (four) times daily.     . budesonide-formoterol (SYMBICORT) 160-4.5 MCG/ACT inhaler Inhale 2 puffs into the lungs 2 (two) times daily as needed (shortness of breath).     . cycloSPORINE (RESTASIS) 0.05 % ophthalmic emulsion Place 1 drop into both eyes 2 (two) times daily.    Marland Kitchen esomeprazole (NEXIUM) 20 MG capsule Take 20 mg by mouth daily at 12 noon.    . fluticasone (FLONASE) 50 MCG/ACT nasal spray Place 1 spray into both nostrils 2 (two) times daily as needed for allergies or rhinitis. 16 g 6  . lisinopril (PRINIVIL,ZESTRIL) 10 MG tablet Take 10 mg by mouth daily.    Marland Kitchen  lovastatin (MEVACOR) 40 MG tablet Take 40 mg by mouth at bedtime.    . metFORMIN (GLUCOPHAGE) 500 MG tablet Take 1 tablet (500 mg total) by mouth 2 (two) times daily with a meal. 60 tablet 0  . nitroGLYCERIN (NITROSTAT) 0.4 MG SL tablet DISSOLVE 1 TABLET UNDER THE TONGUE EVERY 5 MINUTES IF NEEDED FOR CHEST PAIN. MAX 3 DOSES THEN CALL 911. (Patient taking differently: Place 0.4 mg under the tongue every 5 (five) minutes as needed for chest pain. ) 25 tablet 3  . OXYGEN Inhale 4 L into the lungs continuous.    Marland Kitchen  rOPINIRole (REQUIP) 1 MG tablet Take 1 mg by mouth 3 (three) times daily.     . traMADol-acetaminophen (ULTRACET) 37.5-325 MG tablet TAKE 1 TABLET BY MOUTH EVERY 6 HOURS AS NEEDED 28 tablet 0   Current Facility-Administered Medications  Medication Dose Route Frequency Provider Last Rate Last Dose  . triamcinolone acetonide (KENALOG) 10 MG/ML injection 10 mg  10 mg Other Once Landis Martins, DPM         Past Surgical History:  Procedure Laterality Date  . CHOLECYSTECTOMY    . COLONOSCOPY  July 2010   Dr. Arnoldo Morale: 3 rectal polyps, not enough tissue for pathologic examination, recommended surveillance in 3 years  . COLONOSCOPY N/A 10/23/2014   RMR: Multiple colonic polyps removed as described above. No endoscopic explaniation for abdominal pain. however. next tcs 10/2019  . ESOPHAGOGASTRODUODENOSCOPY  July 2010   Dr. Arnoldo Morale: gastritis and duodenitis, H.pylori negative  . ESOPHAGOGASTRODUODENOSCOPY N/A 10/23/2014   RMR: Normal EGD. Status post passage of a Maloney dilator. Today's finding s would not explain abdominal pain  . GANGLION CYST EXCISION Left 09/07/2018   Procedure: REMOVAL GANGLION OF WRIST;  Surgeon: Carole Civil, MD;  Location: [facility];  Service: Orthopedics;  Laterality: Left;  . HERNIA REPAIR     Dr. Arnoldo Morale  . KIDNEY STONE SURGERY    . MALONEY DILATION N/A 10/23/2014   Procedure: Venia Minks DILATION;  Surgeon: Daneil Dolin, MD;  Location: [facility];  Service: Endoscopy;  Laterality: N/A;  . OPEN REDUCTION INTERNAL FIXATION (ORIF) DISTAL RADIAL FRACTURE Right 12/09/2015   Procedure: OPEN REDUCTION INTERNAL FIXATION (ORIF) RIGHT DISTAL RADIUS;  Surgeon: Leanora Cover, MD;  Location: [place];  Service: Orthopedics;  Laterality: Right;     Allergies  Allergen Reactions  . Penicillins Other (See Comments)    Patient is unsure if she allergic to penicillin or septra. Patient states one or another caused "rib pain with a little breathing problem". Has  patient had a PCN reaction causing immediate rash, facial/tongue/throat swelling, SOB or lightheadedness with hypotension: YES Has patient had a PCN reaction causing severe rash involving mucus membranes or skin necrosis: NO Has patient had a PCN reaction that required hospitalization: NO Has patient had a PCN reaction occurring within the last 10 years: NO  . Septra [Sulfamethoxazole-Trimethoprim] Other (See Comments)    Patient is unsure if she allergic to septra or penicillin. Patient states one or another caused "rib pain with a little breathing problem".      Family History  Adopted: Yes     Social History Ms. Tomaso reports that she has been smoking cigarettes. She has a 21.00 pack-year smoking history. She has never used smokeless tobacco. Ms. Bluestein reports current alcohol use of about 1.0 standard drinks of alcohol per week.   Review of Systems CONSTITUTIONAL: No weight loss, fever, chills, weakness or fatigue.  HEENT: Eyes: No visual  loss, blurred vision, double vision or yellow sclerae.No hearing loss, sneezing, congestion, runny nose or sore throat.  SKIN: No rash or itching.  CARDIOVASCULAR: per hpi RESPIRATORY: per hpi  GASTROINTESTINAL: No anorexia, nausea, vomiting or diarrhea. No abdominal pain or blood.  GENITOURINARY: No burning on urination, no polyuria NEUROLOGICAL: No headache, dizziness, syncope, paralysis, ataxia, numbness or tingling in the extremities. No change in bowel or bladder control.  MUSCULOSKELETAL: No muscle, back pain, joint pain or stiffness.  LYMPHATICS: No enlarged nodes. No history of splenectomy.  PSYCHIATRIC: No history of depression or anxiety.  ENDOCRINOLOGIC: No reports of sweating, cold or heat intolerance. No polyuria or polydipsia.  Marland Kitchen   Physical Examination Today's Vitals   05/14/19 1334  BP: (!) 142/74  Pulse: 92  Temp: (!) 97.1 F (36.2 C)  TempSrc: Temporal  SpO2: 94%  Weight: 218 lb (98.9 kg)  Height: 5\' 4"   (1.626 m)   Body mass index is 37.42 kg/m.  Gen: resting comfortably, no acute distress HEENT: no scleral icterus, pupils equal round and reactive, no palptable cervical adenopathy,  CV: RRR, 2/6 systolic murmur rusb, no jvd Resp: Clear to auscultation bilaterally GI: abdomen is soft, non-tender, non-distended, normal bowel sounds, no hepatosplenomegaly MSK: extremities are warm, no edema.  Skin: warm, no rash Neuro:  no focal deficits Psych: appropriate affect   Diagnostic Studies  09/2018 echo IMPRESSIONS    1. The left ventricle has normal systolic function with an ejection fraction of 60-65%. The cavity size was normal. There is mild concentric left ventricular hypertrophy. Left ventricular diastolic Doppler parameters are consistent with  pseudonormalization. Elevated left ventricular end-diastolic pressure No evidence of left ventricular regional wall motion abnormalities.  2. The right ventricle has normal systolic function. The cavity was normal. There is no increase in right ventricular wall thickness.  3. The tricuspid valve is grossly normal.  4. The aortic valve is tricuspid.  5. The aortic root is normal in size and structure.   2015 exercise stress test negative for ischemia   Assessment and Plan  1. Leg edema/SOB/Chronic diastolic HF - significant worsening over the last few months. Limited response to increased lasix dosing. - repeat echo due to clinical chagne - d/c lasix, start torsemide 20mg  daily, may take additional 20mg  prn for severe swelling.   2. Leg pains - certain large component of sciatica. Some exertional calf pains that could be claudication, long smoking history. Obtain ABIs  F/u 3 weeks  Arnoldo Lenis, M.D.,

## 2019-05-14 NOTE — Telephone Encounter (Signed)
°  Precert needed for: ECHO & ABI's   Location: [facility]    Date: Dec10, 2020

## 2019-05-16 ENCOUNTER — Other Ambulatory Visit: Payer: Self-pay | Admitting: Cardiology

## 2019-05-16 DIAGNOSIS — I739 Peripheral vascular disease, unspecified: Secondary | ICD-10-CM

## 2019-05-24 ENCOUNTER — Telehealth: Payer: Self-pay | Admitting: Sports Medicine

## 2019-05-24 NOTE — Telephone Encounter (Signed)
[facility] called the authorization expired on Sept 15 pt kept rescheduling appt now has a appt on nov 25 [facility] will hold it till we contact them back pt is scheduled for appt with Dr. Cannon Kettle 06/12/2019

## 2019-05-28 NOTE — Telephone Encounter (Signed)
Left message informing pt the MRI had been denied and she should cancel the 05/30/2019 MRI appt or she would have to pay the charge, I told pt that previously in September she had stated she would go ahead and schedule surgery.

## 2019-05-28 NOTE — Telephone Encounter (Signed)
I spoke with [facility] states [phone] was denied in September, could use the same order, but would have to have pt reevaluated by Dr. Cannon Kettle to see if denial would be overturned.

## 2019-05-29 NOTE — Telephone Encounter (Signed)
I informed pt MRI had been denied and if she had it tomorrow she would have to pay. Pt states she can not pay and will cancel and has changed her appt with Dr. Cannon Kettle.

## 2019-05-30 ENCOUNTER — Other Ambulatory Visit: Payer: Medicaid Other

## 2019-06-04 ENCOUNTER — Other Ambulatory Visit: Payer: Self-pay | Admitting: Adult Health

## 2019-06-04 ENCOUNTER — Other Ambulatory Visit (HOSPITAL_COMMUNITY): Payer: Self-pay | Admitting: Adult Health

## 2019-06-04 DIAGNOSIS — M545 Low back pain, unspecified: Secondary | ICD-10-CM

## 2019-06-04 DIAGNOSIS — M5442 Lumbago with sciatica, left side: Secondary | ICD-10-CM

## 2019-06-11 ENCOUNTER — Ambulatory Visit (HOSPITAL_COMMUNITY)
Admission: RE | Admit: 2019-06-11 | Discharge: 2019-06-11 | Disposition: A | Discharge status: Home / Self Care | Payer: Medicaid Other | Source: Ambulatory Visit | Attending: Adult Health | Admitting: Adult Health

## 2019-06-11 ENCOUNTER — Other Ambulatory Visit: Payer: Self-pay

## 2019-06-11 DIAGNOSIS — M545 Low back pain, unspecified: Secondary | ICD-10-CM

## 2019-06-11 DIAGNOSIS — M5442 Lumbago with sciatica, left side: Secondary | ICD-10-CM

## 2019-06-12 ENCOUNTER — Ambulatory Visit: Payer: Medicaid Other | Admitting: Sports Medicine

## 2019-06-14 ENCOUNTER — Ambulatory Visit (INDEPENDENT_AMBULATORY_CARE_PROVIDER_SITE_OTHER): Payer: Medicaid Other

## 2019-06-14 ENCOUNTER — Ambulatory Visit: Payer: Medicaid Other

## 2019-06-14 ENCOUNTER — Other Ambulatory Visit (HOSPITAL_COMMUNITY): Payer: Self-pay | Admitting: Pulmonary Disease

## 2019-06-14 ENCOUNTER — Other Ambulatory Visit: Payer: Self-pay

## 2019-06-14 DIAGNOSIS — I739 Peripheral vascular disease, unspecified: Secondary | ICD-10-CM | POA: Diagnosis not present

## 2019-06-14 DIAGNOSIS — R059 Cough, unspecified: Secondary | ICD-10-CM

## 2019-06-14 DIAGNOSIS — R05 Cough: Secondary | ICD-10-CM

## 2019-06-14 DIAGNOSIS — R0602 Shortness of breath: Secondary | ICD-10-CM | POA: Diagnosis not present

## 2019-06-19 ENCOUNTER — Other Ambulatory Visit: Payer: Self-pay

## 2019-06-19 ENCOUNTER — Encounter: Payer: Self-pay | Admitting: Student

## 2019-06-19 ENCOUNTER — Ambulatory Visit (INDEPENDENT_AMBULATORY_CARE_PROVIDER_SITE_OTHER): Payer: Medicaid Other | Admitting: Student

## 2019-06-19 VITALS — BP 136/88 | HR 88 | Temp 96.9°F | Ht 66.0 in | Wt 219.0 lb

## 2019-06-19 DIAGNOSIS — I10 Essential (primary) hypertension: Secondary | ICD-10-CM

## 2019-06-19 DIAGNOSIS — J449 Chronic obstructive pulmonary disease, unspecified: Secondary | ICD-10-CM

## 2019-06-19 DIAGNOSIS — I2510 Atherosclerotic heart disease of native coronary artery without angina pectoris: Secondary | ICD-10-CM | POA: Diagnosis not present

## 2019-06-19 DIAGNOSIS — I5032 Chronic diastolic (congestive) heart failure: Secondary | ICD-10-CM | POA: Diagnosis not present

## 2019-06-19 DIAGNOSIS — E785 Hyperlipidemia, unspecified: Secondary | ICD-10-CM

## 2019-06-19 MED ORDER — TORSEMIDE 20 MG PO TABS: 20.0000 mg | ORAL_TABLET | Freq: Every day | ORAL | 3 refills | Status: DC | PRN

## 2019-06-19 NOTE — Progress Notes (Signed)
Cardiology Office Note    Date:  06/19/2019   ID:  [name], [name] [birth year], MRN [id number]  PCP:  Jani Gravel, MD  Cardiologist: Carlyle Dolly, MD    Chief Complaint  Patient presents with  . Follow-up    1 month visit    History of Present Illness:    [name] is a 63 y.o. female with past medical history of chronic diastolic CHF, coronary calcifications (by prior CT Imaging with low-risk NST in 2015), HTN, HLD, Type II DM and COPD who presents to the office today for 1 month follow-up.  She was last examined by Dr. Harl Bowie in 05/2019 for evaluation of worsening dyspnea on exertion and lower extremity edema. Lasix had been titrated from 40 mg daily to 80 mg daily by her PCP with minimal improvement in her symptoms. Also reported having bilateral leg pain with ambulation which would improve with rest. Lasix was discontinued and she was transitioned to Torsemide 20 mg daily with plans to obtain an echocardiogram along with lower extremity ABI's. Echocardiogram showed a preserved EF of 60 to 65% with no regional wall motion abnormalities. She did have trivial MR and trivial TR. Lower extremity ABI's showed no evidence of arterial disease.  In talking with the patient today, she reports having chronic dyspnea on exertion in the setting of COPD but denies any acute change in her symptoms. She does use 4 L nasal cannula at baseline. She reports having some worsening dyspnea at night and by review of her current oxygen concentrator setup, she is sleeping with her 20 foot cord at night. She denies any recent orthopnea, PND, chest pain or palpitations.  She does report intermittent lower extremity edema but has only taken Torsemide once since her last office visit.  Says that weight has overall been stable.  She has been trying to reduce her tobacco use and is down to less than 10 cigarettes a day.   Past Medical History:  Diagnosis Date  . Anxiety   . Arthritis    . Bipolar 1 disorder ([place])   . COPD (chronic obstructive pulmonary disease) ([place])   . Depression   . Diabetes mellitus without complication ([place])   . Emphysema of lung ([place])   . GERD (gastroesophageal reflux disease)   . History of kidney stones   . Hyperlipidemia   . Hypertension   . Pneumonia 2015  . Vaginal Pap smear, abnormal     Past Surgical History:  Procedure Laterality Date  . CHOLECYSTECTOMY    . COLONOSCOPY  July 2010   Dr. Arnoldo Morale: 3 rectal polyps, not enough tissue for pathologic examination, recommended surveillance in 3 years  . COLONOSCOPY N/A 10/23/2014   RMR: Multiple colonic polyps removed as described above. No endoscopic explaniation for abdominal pain. however. next tcs 10/2019  . ESOPHAGOGASTRODUODENOSCOPY  July 2010   Dr. Arnoldo Morale: gastritis and duodenitis, H.pylori negative  . ESOPHAGOGASTRODUODENOSCOPY N/A 10/23/2014   RMR: Normal EGD. Status post passage of a Maloney dilator. Today's finding s would not explain abdominal pain  . GANGLION CYST EXCISION Left 09/07/2018   Procedure: REMOVAL GANGLION OF WRIST;  Surgeon: Carole Civil, MD;  Location: [facility];  Service: Orthopedics;  Laterality: Left;  . HERNIA REPAIR     Dr. Arnoldo Morale  . KIDNEY STONE SURGERY    . MALONEY DILATION N/A 10/23/2014   Procedure: Venia Minks DILATION;  Surgeon: Daneil Dolin, MD;  Location: [facility];  Service: Endoscopy;  Laterality: N/A;  .  OPEN REDUCTION INTERNAL FIXATION (ORIF) DISTAL RADIAL FRACTURE Right 12/09/2015   Procedure: OPEN REDUCTION INTERNAL FIXATION (ORIF) RIGHT DISTAL RADIUS;  Surgeon: Leanora Cover, MD;  Location: [facility];  Service: Orthopedics;  Laterality: Right;    Current Medications: Outpatient Medications Prior to Visit  Medication Sig Dispense Refill  . acetaminophen-codeine (TYLENOL #3) 300-30 MG tablet Take 1 tablet by mouth every 6 (six) hours as needed for moderate pain. 30 tablet 0  . albuterol (PROVENTIL HFA;VENTOLIN HFA) 108 (90  Base) MCG/ACT inhaler Inhale 2 puffs into the lungs every 6 (six) hours as needed for wheezing or shortness of breath.    Marland Kitchen albuterol (PROVENTIL) (2.5 MG/3ML) 0.083% nebulizer solution Take 2.5 mg by nebulization every 4 (four) hours as needed for wheezing or shortness of breath.     Marland Kitchen albuterol-ipratropium (COMBIVENT) 18-103 MCG/ACT inhaler Inhale 2 puffs into the lungs 4 (four) times daily.     . budesonide-formoterol (SYMBICORT) 160-4.5 MCG/ACT inhaler Inhale 2 puffs into the lungs 2 (two) times daily as needed (shortness of breath).     . cycloSPORINE (RESTASIS) 0.05 % ophthalmic emulsion Place 1 drop into both eyes 2 (two) times daily.    Marland Kitchen esomeprazole (NEXIUM) 20 MG capsule Take 20 mg by mouth daily at 12 noon.    . fluticasone (FLONASE) 50 MCG/ACT nasal spray Place 1 spray into both nostrils 2 (two) times daily as needed for allergies or rhinitis. 16 g 6  . gabapentin (NEURONTIN) 400 MG capsule Take 400 mg by mouth 4 (four) times daily.    Marland Kitchen lisinopril (PRINIVIL,ZESTRIL) 10 MG tablet Take 10 mg by mouth daily.    Marland Kitchen lovastatin (MEVACOR) 40 MG tablet Take 40 mg by mouth at bedtime.    . nitroGLYCERIN (NITROSTAT) 0.4 MG SL tablet DISSOLVE 1 TABLET UNDER THE TONGUE EVERY 5 MINUTES IF NEEDED FOR CHEST PAIN. MAX 3 DOSES THEN CALL 911. (Patient taking differently: Place 0.4 mg under the tongue every 5 (five) minutes as needed for chest pain. ) 25 tablet 3  . OXYGEN Inhale 4 L into the lungs continuous.    Marland Kitchen rOPINIRole (REQUIP) 1 MG tablet Take 1 mg by mouth 3 (three) times daily.     . traMADol-acetaminophen (ULTRACET) 37.5-325 MG tablet TAKE 1 TABLET BY MOUTH EVERY 6 HOURS AS NEEDED 28 tablet 0  . torsemide (DEMADEX) 20 MG tablet Take 20 mg daily. May take an additional 20 mg daily as needed for extreme swelling. 180 tablet 3  . metFORMIN (GLUCOPHAGE) 500 MG tablet Take 1 tablet (500 mg total) by mouth 2 (two) times daily with a meal. 60 tablet 0   Facility-Administered Medications Prior to Visit   Medication Dose Route Frequency Provider Last Rate Last Admin  . triamcinolone acetonide (KENALOG) 10 MG/ML injection 10 mg  10 mg Other Once Landis Martins, DPM         Allergies:   Penicillins and Septra [sulfamethoxazole-trimethoprim]   Social History   Socioeconomic History  . Marital status: Married    Spouse name: Not on file  . Number of children: Not on file  . Years of education: Not on file  . Highest education level: Not on file  Occupational History  . Occupation: disability  Tobacco Use  . Smoking status: Current Every Day Smoker    Packs/day: 0.50    Years: 42.00    Pack years: 21.00    Types: Cigarettes  . Smokeless tobacco: Never Used  . Tobacco comment: 1/2 pack daily  Substance  and Sexual Activity  . Alcohol use: Yes    Alcohol/week: 1.0 standard drinks    Types: 1 Standard drinks or equivalent per week    Comment: quit 11/03/2014. used to drink couple of 40 ounces.  . Drug use: No  . Sexual activity: Yes    Birth control/protection: Post-menopausal  Other Topics Concern  . Not on file  Social History Narrative  . Not on file   Social Determinants of Health   Financial Resource Strain:   . Difficulty of Paying Living Expenses: Not on file  Food Insecurity:   . Worried About Charity fundraiser in the Last Year: Not on file  . Ran Out of Food in the Last Year: Not on file  Transportation Needs:   . Lack of Transportation (Medical): Not on file  . Lack of Transportation (Non-Medical): Not on file  Physical Activity:   . Days of Exercise per Week: Not on file  . Minutes of Exercise per Session: Not on file  Stress:   . Feeling of Stress : Not on file  Social Connections:   . Frequency of Communication with Friends and Family: Not on file  . Frequency of Social Gatherings with Friends and Family: Not on file  . Attends Religious Services: Not on file  . Active Member of Clubs or Organizations: Not on file  . Attends Archivist Meetings:  Not on file  . Marital Status: Not on file     Family History:  The patient's family history is not on file. She was adopted.   Review of Systems:   Please see the history of present illness.     General:  No chills, fever, night sweats or weight changes.  Cardiovascular:  No chest pain, orthopnea, palpitations, paroxysmal nocturnal dyspnea. Positive for dyspnea on exertion and edema (improved).  Dermatological: No rash, lesions/masses Respiratory: No cough, dyspnea Urologic: No hematuria, dysuria Abdominal:   No nausea, vomiting, diarrhea, bright red blood per rectum, melena, or hematemesis Neurologic:  No visual changes, wkns, changes in mental status. All other systems reviewed and are otherwise negative except as noted above.   Physical Exam:    VS:  BP 136/88   Pulse 88   Temp (!) 96.9 F (36.1 C)   Ht 5\' 6"  (1.676 m)   Wt 219 lb (99.3 kg)   SpO2 97%   BMI 35.35 kg/m    General: Well developed, well nourished,female appearing in no acute distress. Head: Normocephalic, atraumatic, sclera non-icteric, no xanthomas, nares are without discharge.  Neck: No carotid bruits. JVD not elevated.  Lungs: Respirations regular and unlabored, without wheezes or rales.  Heart: Regular rate and rhythm. No S3 or S4.  No murmur, no rubs, or gallops appreciated. Abdomen: Soft, non-tender, non-distended with normoactive bowel sounds. No hepatomegaly. No rebound/guarding. No obvious abdominal masses. Msk:  Strength and tone appear normal for age. No joint deformities or effusions. Extremities: No clubbing or cyanosis. Trace ankle edema bilaterally.  Distal pedal pulses are 2+ bilaterally. Varicose veins noted.  Neuro: Alert and oriented X 3. Moves all extremities spontaneously. No focal deficits noted. Psych:  Responds to questions appropriately with a normal affect. Skin: No rashes or lesions noted  Wt Readings from Last 3 Encounters:  06/19/19 219 lb (99.3 kg)  05/14/19 218 lb (98.9 kg)   10/31/18 234 lb (106.1 kg)     Studies/Labs Reviewed:   EKG:  EKG is not ordered today.   Recent Labs: 08/31/2018: BUN 15;  Creatinine, Ser 0.61; Hemoglobin 14.0; Platelets 235; Potassium 3.9; Sodium 137   Lipid Panel    Component Value Date/Time   CHOL 197 07/18/2015 1113   TRIG 144 07/18/2015 1113   HDL 41 07/18/2015 1113   CHOLHDL 4.8 (H) 07/18/2015 1113   CHOLHDL 3.1 03/07/2014 0343   VLDL 40 03/07/2014 0343   LDLCALC 127 (H) 07/18/2015 1113    Additional studies/ records that were reviewed today include:   Lower Extremity ABI's: 06/2019 Summary: Right: Resting right ankle-brachial index is within normal range. No evidence of significant right lower extremity arterial disease. The right toe-brachial index is abnormal.  Left: Resting left ankle-brachial index is within normal range. No evidence of significant left lower extremity arterial disease. The left toe-brachial index is abnormal.  Echocardiogram: 06/14/2019 IMPRESSIONS    1. Left ventricular ejection fraction, by visual estimation, is 60 to 65%. The left ventricle has normal function. There is no left ventricular hypertrophy.  2. The left ventricle has no regional wall motion abnormalities.  3. Global right ventricle has normal systolic function.The right ventricular size is normal. No increase in right ventricular wall thickness.  4. Left atrial size was upper normal.  5. Right atrial size was normal.  6. The mitral valve is grossly normal. Trivial mitral valve regurgitation.  7. The tricuspid valve is grossly normal. Tricuspid valve regurgitation is trivial.  8. The aortic valve is tricuspid. Aortic valve regurgitation is not visualized.  9. The pulmonic valve was grossly normal. Pulmonic valve regurgitation is trivial. 10. TR signal is inadequate for assessing pulmonary artery systolic pressure. 11. The inferior vena cava is normal in size with greater than 50% respiratory variability, suggesting right  atrial pressure of 3 mmHg.   Assessment:    1. Chronic diastolic heart failure ([place])   2. Coronary artery calcification seen on CT scan   3. Essential hypertension   4. Hyperlipidemia LDL goal <70   5. Chronic obstructive pulmonary disease, unspecified COPD type ([place])      Plan:   In order of problems listed above:  1. Chronic Diastolic CHF - She had been experiencing worsening lower extremity edema but reports her symptoms improved after being switched to Torsemide. She did take this for several days in a row initially but has only taken once in the past several weeks and weight has overall been stable. Most recent echocardiogram showed a preserved EF with no wall motion abnormalities as outlined above - I recommended that she continue to follow daily weights and take Torsemide as needed for edema or weight gain greater than 2 pounds overnight or 5 pounds in 1 week.  2. Coronary Calcifications - She did have coronary calcifications noted on prior CT imaging and most recent ischemic evaluation was a low-risk NST in 2015. Most recent echocardiogram showed preserved EF with no wall motion abnormalities. She denies any recent chest pain. Continue with risk factor modification at this time. She remains on statin therapy.   3. HTN - BP is well controlled at 136/88 during today's visit. Continue Lisinopril 10 mg daily.  4. HLD - Followed by PCP. She remains on Lovastatin 40 mg daily with goal LDL less than 70.  5. COPD - she is on 4 L nasal cannula at baseline. We did review using a shorter cord at home, specifically at nighttime to improve the amount of oxygen she is receiving. Also recommended purchasing a pulse oximeter to monitor her O2 saturations.   Medication Adjustments/Labs and Tests Ordered: Current medicines  are reviewed at length with the patient today.  Concerns regarding medicines are outlined above.  Medication changes, Labs and Tests ordered today are listed in the  Patient Instructions below. Patient Instructions  Medication Instructions:  Your physician recommends that you continue on your current medications as directed. Please refer to the Current Medication list given to you today.  *If you need a refill on your cardiac medications before your next appointment, please call your pharmacy*  Lab Work: NONE   If you have labs (blood work) drawn today and your tests are completely normal, you will receive your results only by: Marland Kitchen MyChart Message (if you have MyChart) OR . A paper copy in the mail If you have any lab test that is abnormal or we need to change your treatment, we will call you to review the results.  Testing/Procedures: NONE   Follow-Up: At [facility], you and your health needs are our priority.  As part of our continuing mission to provide you with exceptional heart care, we have created designated Provider Care Teams.  These Care Teams include your primary Cardiologist (physician) and Advanced Practice Providers (APPs -  Physician Assistants and Nurse Practitioners) who all work together to provide you with the care you need, when you need it.  Your next appointment:   6 month(s)  The format for your next appointment:   In Person  Provider:   Carlyle Dolly, MD  Other Instructions Thank you for choosing [facility]!    Signed, Erma Heritage, PA-C  06/19/2019 4:10 PM    [facility] [address] [place], [place] [address] Phone: [phone] Fax: [phone]

## 2019-06-19 NOTE — Patient Instructions (Signed)
Medication Instructions:  Your physician recommends that you continue on your current medications as directed. Please refer to the Current Medication list given to you today.  *If you need a refill on your cardiac medications before your next appointment, please call your pharmacy*  Lab Work: NONE   If you have labs (blood work) drawn today and your tests are completely normal, you will receive your results only by: Marland Kitchen MyChart Message (if you have MyChart) OR . A paper copy in the mail If you have any lab test that is abnormal or we need to change your treatment, we will call you to review the results.  Testing/Procedures: NONE   Follow-Up: At [facility], you and your health needs are our priority.  As part of our continuing mission to provide you with exceptional heart care, we have created designated Provider Care Teams.  These Care Teams include your primary Cardiologist (physician) and Advanced Practice Providers (APPs -  Physician Assistants and Nurse Practitioners) who all work together to provide you with the care you need, when you need it.  Your next appointment:   6 month(s)  The format for your next appointment:   In Person  Provider:   Carlyle Dolly, MD  Other Instructions Thank you for choosing [facility]!

## 2019-06-21 DIAGNOSIS — Z79891 Long term (current) use of opiate analgesic: Secondary | ICD-10-CM | POA: Insufficient documentation

## 2019-06-21 DIAGNOSIS — M48061 Spinal stenosis, lumbar region without neurogenic claudication: Secondary | ICD-10-CM | POA: Insufficient documentation

## 2019-06-26 ENCOUNTER — Ambulatory Visit: Payer: Medicaid Other | Admitting: Sports Medicine

## 2019-07-02 ENCOUNTER — Telehealth: Payer: Self-pay | Admitting: Adult Health

## 2019-07-02 NOTE — Telephone Encounter (Signed)
Called patient regarding appointment and the following message was left:   We have you scheduled for an upcoming appointment at our office. At this time, we are still not allowing visitors or children during the appointment, however, a support person, over age 64, may accompany you to your appointment if assistance is needed for safety or care concerns. Otherwise, support persons should remain outside until the visit is complete.   We ask if you have had any exposure to anyone suspected or confirmed of having COVID-19, are awaiting test results for COVID-19 or if you are experiencing any of the following, to call and reschedule your appointment: fever, cough, shortness of breath, muscle pain, diarrhea, rash, vomiting, abdominal pain, red eye, weakness, bruising, bleeding, joint pain, or a severe headache.   Please know we will ask you these questions or similar questions when you arrive for your appointment and again it's how we are keeping everyone safe.    Also,to keep you safe, please use the provided hand sanitizer when you enter the office. We are asking everyone in the office to wear a mask to help prevent the spread of germs. If you have a mask of your own, please wear it to your appointment, if not, we are happy to provide one for you.  Thank you for understanding and your cooperation.    [facility] Staff      

## 2019-07-03 ENCOUNTER — Other Ambulatory Visit: Payer: Medicaid Other | Admitting: Adult Health

## 2019-07-13 DIAGNOSIS — M5136 Other intervertebral disc degeneration, lumbar region: Secondary | ICD-10-CM | POA: Insufficient documentation

## 2019-07-13 DIAGNOSIS — M51369 Other intervertebral disc degeneration, lumbar region without mention of lumbar back pain or lower extremity pain: Secondary | ICD-10-CM | POA: Insufficient documentation

## 2019-07-13 DIAGNOSIS — M47816 Spondylosis without myelopathy or radiculopathy, lumbar region: Secondary | ICD-10-CM | POA: Insufficient documentation

## 2019-07-13 DIAGNOSIS — M5416 Radiculopathy, lumbar region: Secondary | ICD-10-CM | POA: Insufficient documentation

## 2019-07-17 ENCOUNTER — Ambulatory Visit: Payer: Medicaid Other | Admitting: Sports Medicine

## 2019-07-24 ENCOUNTER — Ambulatory Visit: Payer: Medicaid Other | Attending: Internal Medicine

## 2019-07-24 ENCOUNTER — Other Ambulatory Visit: Payer: Self-pay

## 2019-07-24 DIAGNOSIS — Z20822 Contact with and (suspected) exposure to covid-19: Secondary | ICD-10-CM

## 2019-07-25 LAB — NOVEL CORONAVIRUS, NAA: SARS-CoV-2, NAA: NOT DETECTED

## 2019-07-26 ENCOUNTER — Telehealth: Payer: Self-pay | Admitting: Internal Medicine

## 2019-07-26 ENCOUNTER — Other Ambulatory Visit: Payer: Medicaid Other | Admitting: Adult Health

## 2019-07-26 NOTE — Telephone Encounter (Signed)
Negative COVID results given. Patient results "NOT Detected." Caller expressed understanding. ° °

## 2019-07-27 ENCOUNTER — Telehealth: Payer: Self-pay

## 2019-07-27 NOTE — Telephone Encounter (Signed)
Spoke with pt. She is concerned about her swelling in her right leg. She has made an appt. For [place] on 1/26. She was only taking her torsemide 20 mg as needed rather than daily. I informed her that the RX states 20 daily and an additional 20 mg as needed for extreme swelling. She will call on Monday to update.

## 2019-07-27 NOTE — Telephone Encounter (Signed)
Pt called in requesting appt with Brittany,PA-C. Pt states she was advised per [place] if she was not feeling well. Pt states she has fluid in her right leg, and she doesn't feel well.  Ph: [phone]  Thanks renee

## 2019-07-31 ENCOUNTER — Ambulatory Visit: Payer: Medicaid Other | Admitting: Student

## 2019-07-31 NOTE — Progress Notes (Deleted)
Cardiology Office Note    Date:  07/31/2019   ID:  [name], [name] [birth year], MRN [id number]  PCP:  Jani Gravel, MD  Cardiologist: Carlyle Dolly, MD    No chief complaint on file.   History of Present Illness:    [name] is a 64 y.o. female with past medical history of chronic diastolic CHF, HTN, HLD, Type 2 DM, coronary artery calcifications by prior CT (low-risk NST in 2015), COPD and continued tobacco use who presents to the office today for evaluation of lower extremity edema.   She was last examined by myself in 06/2019 for follow-up from a recent visit with Dr. Harl Bowie during which Lasix was transitioned to Torsemide 20mg  daily. Echocardiogram in the interim showed a preserved EF of 60-65% with no WMA. Lower extremity ABI's showed no evidence of arterial disease. She reported chronic dyspnea but no orthopnea or PND. Weight had been stable at 219 lbs and she was only taking Torsemide as needed.  She did call back and report worsening edema in the interim and was encouraged to take Torsemide 20 mg daily.      Past Medical History:  Diagnosis Date  . Anxiety   . Arthritis   . Bipolar 1 disorder ([place])   . COPD (chronic obstructive pulmonary disease) ([place])   . Depression   . Diabetes mellitus without complication ([place])   . Emphysema of lung ([place])   . GERD (gastroesophageal reflux disease)   . History of kidney stones   . Hyperlipidemia   . Hypertension   . Pneumonia 2015  . Vaginal Pap smear, abnormal     Past Surgical History:  Procedure Laterality Date  . CHOLECYSTECTOMY    . COLONOSCOPY  July 2010   Dr. Arnoldo Morale: 3 rectal polyps, not enough tissue for pathologic examination, recommended surveillance in 3 years  . COLONOSCOPY N/A 10/23/2014   RMR: Multiple colonic polyps removed as described above. No endoscopic explaniation for abdominal pain. however. next tcs 10/2019  . ESOPHAGOGASTRODUODENOSCOPY  July 2010   Dr. Arnoldo Morale: gastritis and  duodenitis, H.pylori negative  . ESOPHAGOGASTRODUODENOSCOPY N/A 10/23/2014   RMR: Normal EGD. Status post passage of a Maloney dilator. Today's finding s would not explain abdominal pain  . GANGLION CYST EXCISION Left 09/07/2018   Procedure: REMOVAL GANGLION OF WRIST;  Surgeon: Carole Civil, MD;  Location: [facility];  Service: Orthopedics;  Laterality: Left;  . HERNIA REPAIR     Dr. Arnoldo Morale  . KIDNEY STONE SURGERY    . MALONEY DILATION N/A 10/23/2014   Procedure: Venia Minks DILATION;  Surgeon: Daneil Dolin, MD;  [facility];  Service: Endoscopy;  Laterality: N/A;  . OPEN REDUCTION INTERNAL FIXATION (ORIF) DISTAL RADIAL FRACTURE Right 12/09/2015   Procedure: OPEN REDUCTION INTERNAL FIXATION (ORIF) RIGHT DISTAL RADIUS;  Surgeon: Leanora Cover, MD;  Location: [facility];  Service: Orthopedics;  Laterality: Right;    Current Medications: Outpatient Medications Prior to Visit  Medication Sig Dispense Refill  . acetaminophen-codeine (TYLENOL #3) 300-30 MG tablet Take 1 tablet by mouth every 6 (six) hours as needed for moderate pain. 30 tablet 0  . albuterol (PROVENTIL HFA;VENTOLIN HFA) 108 (90 Base) MCG/ACT inhaler Inhale 2 puffs into the lungs every 6 (six) hours as needed for wheezing or shortness of breath.    Marland Kitchen albuterol (PROVENTIL) (2.5 MG/3ML) 0.083% nebulizer solution Take 2.5 mg by nebulization every 4 (four) hours as needed for wheezing or shortness of breath.     Marland Kitchen  albuterol-ipratropium (COMBIVENT) 18-103 MCG/ACT inhaler Inhale 2 puffs into the lungs 4 (four) times daily.     . budesonide-formoterol (SYMBICORT) 160-4.5 MCG/ACT inhaler Inhale 2 puffs into the lungs 2 (two) times daily as needed (shortness of breath).     . cycloSPORINE (RESTASIS) 0.05 % ophthalmic emulsion Place 1 drop into both eyes 2 (two) times daily.    Marland Kitchen esomeprazole (NEXIUM) 20 MG capsule Take 20 mg by mouth daily at 12 noon.    . fluticasone (FLONASE) 50 MCG/ACT nasal spray Place 1 spray into  both nostrils 2 (two) times daily as needed for allergies or rhinitis. 16 g 6  . gabapentin (NEURONTIN) 400 MG capsule Take 400 mg by mouth 4 (four) times daily.    Marland Kitchen lisinopril (PRINIVIL,ZESTRIL) 10 MG tablet Take 10 mg by mouth daily.    Marland Kitchen lovastatin (MEVACOR) 40 MG tablet Take 40 mg by mouth at bedtime.    . metFORMIN (GLUCOPHAGE) 500 MG tablet Take 1 tablet (500 mg total) by mouth 2 (two) times daily with a meal. 60 tablet 0  . nitroGLYCERIN (NITROSTAT) 0.4 MG SL tablet DISSOLVE 1 TABLET UNDER THE TONGUE EVERY 5 MINUTES IF NEEDED FOR CHEST PAIN. MAX 3 DOSES THEN CALL 911. (Patient taking differently: Place 0.4 mg under the tongue every 5 (five) minutes as needed for chest pain. ) 25 tablet 3  . OXYGEN Inhale 4 L into the lungs continuous.    Marland Kitchen rOPINIRole (REQUIP) 1 MG tablet Take 1 mg by mouth 3 (three) times daily.     Marland Kitchen torsemide (DEMADEX) 20 MG tablet Take 1 tablet (20 mg total) by mouth daily as needed. May take an additional 20 mg daily as needed for extreme swelling. 180 tablet 3  . traMADol-acetaminophen (ULTRACET) 37.5-325 MG tablet TAKE 1 TABLET BY MOUTH EVERY 6 HOURS AS NEEDED 28 tablet 0   Facility-Administered Medications Prior to Visit  Medication Dose Route Frequency Provider Last Rate Last Admin  . triamcinolone acetonide (KENALOG) 10 MG/ML injection 10 mg  10 mg Other Once Landis Martins, DPM         Allergies:   Penicillins and Septra [sulfamethoxazole-trimethoprim]   Social History   Socioeconomic History  . Marital status: Married    Spouse name: Not on file  . Number of children: Not on file  . Years of education: Not on file  . Highest education level: Not on file  Occupational History  . Occupation: disability  Tobacco Use  . Smoking status: Current Every Day Smoker    Packs/day: 0.50    Years: 42.00    Pack years: 21.00    Types: Cigarettes  . Smokeless tobacco: Never Used  . Tobacco comment: 1/2 pack daily  Substance and Sexual Activity  . Alcohol  use: Yes    Alcohol/week: 1.0 standard drinks    Types: 1 Standard drinks or equivalent per week    Comment: quit 11/03/2014. used to drink couple of 40 ounces.  . Drug use: No  . Sexual activity: Yes    Birth control/protection: Post-menopausal  Other Topics Concern  . Not on file  Social History Narrative  . Not on file   Social Determinants of Health   Financial Resource Strain:   . Difficulty of Paying Living Expenses: Not on file  Food Insecurity:   . Worried About Charity fundraiser in the Last Year: Not on file  . Ran Out of Food in the Last Year: Not on file  Transportation Needs:   .  Lack of Transportation (Medical): Not on file  . Lack of Transportation (Non-Medical): Not on file  Physical Activity:   . Days of Exercise per Week: Not on file  . Minutes of Exercise per Session: Not on file  Stress:   . Feeling of Stress : Not on file  Social Connections:   . Frequency of Communication with Friends and Family: Not on file  . Frequency of Social Gatherings with Friends and Family: Not on file  . Attends Religious Services: Not on file  . Active Member of Clubs or Organizations: Not on file  . Attends Archivist Meetings: Not on file  . Marital Status: Not on file     Family History:  The patient's ***family history is not on file. She was adopted.   Review of Systems:   Please see the history of present illness.     General:  No chills, fever, night sweats or weight changes.  Cardiovascular:  No chest pain, dyspnea on exertion, edema, orthopnea, palpitations, paroxysmal nocturnal dyspnea. Dermatological: No rash, lesions/masses Respiratory: No cough, dyspnea Urologic: No hematuria, dysuria Abdominal:   No nausea, vomiting, diarrhea, bright red blood per rectum, melena, or hematemesis Neurologic:  No visual changes, wkns, changes in mental status. All other systems reviewed and are otherwise negative except as noted above.   Physical Exam:    VS:   There were no vitals taken for this visit.   General: Well developed, well nourished,female appearing in no acute distress. Head: Normocephalic, atraumatic, sclera non-icteric, no xanthomas, nares are without discharge.  Neck: No carotid bruits. JVD not elevated.  Lungs: Respirations regular and unlabored, without wheezes or rales.  Heart: ***Regular rate and rhythm. No S3 or S4.  No murmur, no rubs, or gallops appreciated. Abdomen: Soft, non-tender, non-distended with normoactive bowel sounds. No hepatomegaly. No rebound/guarding. No obvious abdominal masses. Msk:  Strength and tone appear normal for age. No joint deformities or effusions. Extremities: No clubbing or cyanosis. No edema.  Distal pedal pulses are 2+ bilaterally. Neuro: Alert and oriented X 3. Moves all extremities spontaneously. No focal deficits noted. Psych:  Responds to questions appropriately with a normal affect. Skin: No rashes or lesions noted  Wt Readings from Last 3 Encounters:  06/19/19 219 lb (99.3 kg)  05/14/19 218 lb (98.9 kg)  10/31/18 234 lb (106.1 kg)        Studies/Labs Reviewed:   EKG:  EKG is*** ordered today.  The ekg ordered today demonstrates ***  Recent Labs: 08/31/2018: BUN 15; Creatinine, Ser 0.61; Hemoglobin 14.0; Platelets 235; Potassium 3.9; Sodium 137   Lipid Panel    Component Value Date/Time   CHOL 197 07/18/2015 1113   TRIG 144 07/18/2015 1113   HDL 41 07/18/2015 1113   CHOLHDL 4.8 (H) 07/18/2015 1113   CHOLHDL 3.1 03/07/2014 0343   VLDL 40 03/07/2014 0343   LDLCALC 127 (H) 07/18/2015 1113    Additional studies/ records that were reviewed today include:   Echocardiogram: 06/2019 IMPRESSIONS    1. Left ventricular ejection fraction, by visual estimation, is 60 to 65%. The left ventricle has normal function. There is no left ventricular hypertrophy.  2. The left ventricle has no regional wall motion abnormalities.  3. Global right ventricle has normal systolic  function.The right ventricular size is normal. No increase in right ventricular wall thickness.  4. Left atrial size was upper normal.  5. Right atrial size was normal.  6. The mitral valve is grossly normal. Trivial mitral valve  regurgitation.  7. The tricuspid valve is grossly normal. Tricuspid valve regurgitation is trivial.  8. The aortic valve is tricuspid. Aortic valve regurgitation is not visualized.  9. The pulmonic valve was grossly normal. Pulmonic valve regurgitation is trivial. 10. TR signal is inadequate for assessing pulmonary artery systolic pressure. 11. The inferior vena cava is normal in size with greater than 50% respiratory variability, suggesting right atrial pressure of 3 mmHg.   Assessment:    No diagnosis found.   Plan:   In order of problems listed above:  1. ***    Medication Adjustments/Labs and Tests Ordered: Current medicines are reviewed at length with the patient today.  Concerns regarding medicines are outlined above.  Medication changes, Labs and Tests ordered today are listed in the Patient Instructions below. There are no Patient Instructions on file for this visit.   Signed, Erma Heritage, PA-C  07/31/2019 6:48 AM    [facility] [address] [place], [place] [address] Phone: [phone] Fax: [phone]

## 2019-08-03 ENCOUNTER — Encounter: Payer: Self-pay | Admitting: Student

## 2019-08-28 ENCOUNTER — Ambulatory Visit: Payer: Medicaid Other | Admitting: Sports Medicine

## 2019-08-29 ENCOUNTER — Telehealth: Payer: Self-pay | Admitting: Internal Medicine

## 2019-08-29 ENCOUNTER — Other Ambulatory Visit: Payer: Self-pay

## 2019-08-29 ENCOUNTER — Encounter: Payer: Self-pay | Admitting: Internal Medicine

## 2019-08-29 ENCOUNTER — Ambulatory Visit: Payer: Medicaid Other | Admitting: Internal Medicine

## 2019-08-29 VITALS — BP 126/60 | Temp 97.3°F | Ht 64.0 in | Wt 225.8 lb

## 2019-08-29 DIAGNOSIS — J849 Interstitial pulmonary disease, unspecified: Secondary | ICD-10-CM | POA: Diagnosis not present

## 2019-08-29 DIAGNOSIS — J449 Chronic obstructive pulmonary disease, unspecified: Secondary | ICD-10-CM

## 2019-08-29 DIAGNOSIS — F17200 Nicotine dependence, unspecified, uncomplicated: Secondary | ICD-10-CM | POA: Diagnosis not present

## 2019-08-29 DIAGNOSIS — Z716 Tobacco abuse counseling: Secondary | ICD-10-CM | POA: Diagnosis not present

## 2019-08-29 MED ORDER — BUDESONIDE-FORMOTEROL FUMARATE 160-4.5 MCG/ACT IN AERO: 2.0000 | INHALATION_SPRAY | Freq: Two times a day (BID) | RESPIRATORY_TRACT | 5 refills | Status: DC

## 2019-08-29 MED ORDER — VARENICLINE TARTRATE 0.5 MG X 11 & 1 MG X 42 PO MISC: ORAL | 0 refills | Status: DC

## 2019-08-29 MED ORDER — ALBUTEROL SULFATE (2.5 MG/3ML) 0.083% IN NEBU: 2.5000 mg | INHALATION_SOLUTION | RESPIRATORY_TRACT | 2 refills | Status: DC | PRN

## 2019-08-29 NOTE — Telephone Encounter (Signed)
Spoke with patient. She stated that last night had a "dizzy spell" at home yesterday. During this spell she began hallucinating. Her husband witnessed her doing this as well. The spell lasted 45 minutes and so far has not returned. She denied starting any medications, including the Chantix which was called in today for her. She has felt fine today. She wanted to know if a possible infection can cause this.   She also wanted to know if a CT scan can show signs of infection. I advised her that they can. She verbalized understanding. She also requested a refill on her albuterol neb solution. This has been sent to [facility].   Dr. Shearon Stalls, please advise. Thanks!

## 2019-08-29 NOTE — Telephone Encounter (Signed)
I examined and evaluated her today and I did not feel she had signs or symptoms of an active infection. A CT scan would show infection, but we are ordering that for lung cancer screening, not for infection.

## 2019-08-29 NOTE — Progress Notes (Signed)
[name]    [id number]    1955/12/13  Primary Care Physician:Kim, Jeneen Rinks, MD  Referring Physician: Jani Gravel, MD [address] [place],  [place] [address] Reason for Consultation: shortness of breath Date of Consultation: 08/29/2019  Chief complaint:   Chief Complaint  Patient presents with  . Consult    oxygen dependant, 4-5L uses POC at 4 L ,      HPI: Previous patient of Dr. Luan Pulling for COPD. Has been on oxygen for about 2 years. 5LNC.  FEV1 51% of predicted 1.46L in 2018. She turns up oxygen when she gets short of breath.   Ongoing tobacco use disorder, almost 1ppd.  Uses combivent and pro-air in total 6-7 times/day.  Ongoing issues with chronic pain and anxiety, trouble sleeping. Dyspnea with minimal exertion at home. Last hospitalization in 2019. Dr. Luan Pulling had her on prednisone frequently, she last took it on Sunday. Would get solumedrol shots.   .Body Mass Index, Obstruction, Dyspnea, Exercise Capacity (BODE) Index  FEV1 (% predicted)  ? 65: 0  50-64: 1 36-49: 2 ? 35: 3 6-Minute Walk Test (meters)  ? 350: 0 250-349:1  150-249: 2 ? 149: 3 MMRC Dyspnea Scale 0 1 2 3 4   Body Mass Index  >21: 0 ? 21: 1   4 Year Survival Rates  0-2 points - 80%   3-4 points - 67%   5-6 points - 57%   7-10 points - 18%  References  . Celli BR, Cote CG, Marin JM, et. al. The body-mass index, airflow obstruction, dyspnea and exercise capacity index in chronic obstructive pulmonary disease. N Engl J Med. 2004 Mar 4;350(10):1005-12. Lianne Bushy DA, Wells CK. Evaluation of clinical methods for rating dyspnea. Chest. 1988 Mar;93(3):580-6.   Social history:  Occupation: Exposures: Smoking history: over 101 pack years, currently closer to 1ppd.   Social History   Occupational History  . Occupation: disability  Tobacco Use  . Smoking status: Current Every Day Smoker    Packs/day: 1.50    Years: 51.00    Pack years: 76.50    Types:  Cigarettes    Start date: 82  . Smokeless tobacco: Never Used  . Tobacco comment: 08/29/19- 10-15 cigs a day  Substance and Sexual Activity  . Alcohol use: Yes    Alcohol/week: 1.0 standard drinks    Types: 1 Standard drinks or equivalent per week    Comment: quit 11/03/2014. used to drink couple of 40 ounces.  . Drug use: No  . Sexual activity: Yes    Birth control/protection: Post-menopausal    Relevant family history:  Family History  Adopted: Yes    Past Medical History:  Diagnosis Date  . Anxiety   . Arthritis   . Bipolar 1 disorder ([place])   . COPD (chronic obstructive pulmonary disease) ([place])   . Depression   . Diabetes mellitus without complication ([place])   . Emphysema of lung ([place])   . GERD (gastroesophageal reflux disease)   . History of kidney stones   . Hyperlipidemia   . Hypertension   . Pneumonia 2015  . Vaginal Pap smear, abnormal     Past Surgical History:  Procedure Laterality Date  . CHOLECYSTECTOMY    . COLONOSCOPY  July 2010   Dr. Arnoldo Morale: 3 rectal polyps, not enough tissue for pathologic examination, recommended surveillance in 3 years  . COLONOSCOPY N/A 10/23/2014   RMR: Multiple colonic polyps removed as described above. No endoscopic  explaniation for abdominal pain. however. next tcs 10/2019  . ESOPHAGOGASTRODUODENOSCOPY  July 2010   Dr. Arnoldo Morale: gastritis and duodenitis, H.pylori negative  . ESOPHAGOGASTRODUODENOSCOPY N/A 10/23/2014   RMR: Normal EGD. Status post passage of a Maloney dilator. Today's finding s would not explain abdominal pain  . GANGLION CYST EXCISION Left 09/07/2018   Procedure: REMOVAL GANGLION OF WRIST;  Surgeon: Carole Civil, MD;  [facility];  Service: Orthopedics;  Laterality: Left;  . HERNIA REPAIR     Dr. Arnoldo Morale  . KIDNEY STONE SURGERY    . MALONEY DILATION N/A 10/23/2014   Procedure: Venia Minks DILATION;  Surgeon: Daneil Dolin, MD;  [facility];  Service: Endoscopy;  Laterality: N/A;  . OPEN  REDUCTION INTERNAL FIXATION (ORIF) DISTAL RADIAL FRACTURE Right 12/09/2015   Procedure: OPEN REDUCTION INTERNAL FIXATION (ORIF) RIGHT DISTAL RADIUS;  Surgeon: Leanora Cover, MD;  Location: [facility];  Service: Orthopedics;  Laterality: Right;     Review of systems: Review of Systems  Constitutional: Negative for chills, fever and weight loss.  HENT: Negative for congestion, sinus pain and sore throat.   Eyes: Negative for discharge and redness.  Respiratory: Positive for cough, sputum production, shortness of breath and wheezing. Negative for hemoptysis.   Cardiovascular: Negative for chest pain, palpitations and leg swelling.  Gastrointestinal: Negative for heartburn, nausea and vomiting.  Musculoskeletal: Negative for joint pain and myalgias.  Skin: Negative for rash.  Neurological: Negative for dizziness, tremors, focal weakness and headaches.  Endo/Heme/Allergies: Negative for environmental allergies.  Psychiatric/Behavioral: Negative for depression. The patient is nervous/anxious.   All other systems reviewed and are negative.   Physical Exam: Blood pressure 126/60, temperature (!) 97.3 F (36.3 C), temperature source Temporal, height 5\' 4"  (1.626 m), weight 225 lb 12.8 oz (102.4 kg), SpO2 96 %. Gen:      Chronically ill appearing, appears older than stated age.  Lungs:   Diminished, No increased respiratory effort, symmetric chest wall excursion, clear to auscultation bilaterally, on oxygen 4LNC. Mild end expiratory wheezing.  CV:         Regular rate and rhythm; no murmurs, rubs, or gallops.  No pedal edema Abd:      + bowel sounds; soft, non-tender; no distension MSK: no acute synovitis of DIP or PIP joints, no mechanics hands.  Skin:      Warm and dry; no rashes Neuro: normal speech, no focal facial asymmetry Psych: alert and oriented x3, normal mood and affect   Data Reviewed/Medical Decision Making:  Independent interpretation of tests: Imaging: . Review  of patient's CT chest images in 2019 revealed mild lower lobe bronchietasis and diffuse GGOs with upper lobe predominance. The patient's images have been independently reviewed by me.    PFTs: I have personally reviewed the patient's PFTs and consistent with severe PFT Results Latest Ref Rng & Units 07/14/2016 12/06/2013  FVC-Pre L 1.48 -  FVC-Predicted Pre % 45 52  FVC-Post L - 1.93  FVC-Predicted Post % - 57  Pre FEV1/FVC % % 87 82  Post FEV1/FCV % % - 82  FEV1-Pre L 1.29 1.46  FEV1-Predicted Pre % 51 55  FEV1-Post L - 1.58  DLCO UNC% % - 60  DLCO COR %Predicted % - 100  TLC L - 3.73  TLC % Predicted % - 73  RV % Predicted % - 99    Labs:  Lab Results  Component Value Date   WBC 11.9 (H) 08/31/2018   HGB 14.0 08/31/2018  HCT 42.9 08/31/2018   MCV 97.9 08/31/2018   PLT 235 08/31/2018     Immunization status:  Immunization History  Administered Date(s) Administered  . Influenza Whole 03/29/2019  . Pneumococcal Polysaccharide-23 07/06/2014  . Tdap 11/30/2013    . I reviewed prior external note(s) from Dr. Harl Bowie . I reviewed the result(s) of the labs and imaging as noted above.  . 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 Jolly has worsening of her chronic COPD symptoms with exacerbation. Discussed risks and benefits of prednisone and for now she'd like to resume her symbicort which I advised her to do today, and continue bronchodilators.  We will get a CT Chest to follow up on her GGOs from 2019 which are concerning for RB-ILD.  She may need a bronchoscopy with TBBX if they are still present.   I personally spent 10 minutes counseling the patient regarding tobacco use disorder.  Patient is symptomatic from tobacco use disorder due to the following condition: COPD.  The  patient's response was pre-contemplative/contemplative.  We discussed nicotine replacement therapy, Wellbutrin, Chantix.  We identified to gather patient specific barriers to change.  The patient is open to future discussions about tobacco cessation. We will start with Chantix today. She will set a quit date.   I will contact her with results of her CT Chest and potential bronchoscopy.  She will get a puls-oximeter for monitoring home saturations.   We discussed COPD disease management and progression at length today.    Return to Care: Return in about 3 months (around 11/26/2019).  Lenice Llamas, MD [facility]  CC: Jani Gravel, MD

## 2019-08-29 NOTE — Telephone Encounter (Signed)
When I called the patient with her CT appt information she wanted to let Dr. Shearon Stalls know that yesterday afternoon she got real sleepy and she felt like she was hallucinating and it lasted about 1 hour. She also wanted to make sure that if she has infection in her lungs that the CT would show this.

## 2019-08-29 NOTE — Patient Instructions (Addendum)
Start taking Symbicort 2 puffs morning, 2 puffs at night. Everyday.   Keep taking albuterol and combivent as needed.   We are going to get a CT scan of your chest.   Varenicline -- Varenicline (brand name: Chantix) is a prescription medication that works in the brain to reduce nicotine withdrawal symptoms and cigarette cravings. In several studies, it was more effective than placebo (a lookalike substitute that contains no medication.)  It should be taken after eating with a full glass of water as follows: ?One 0.5 mg tablet daily for three days ?One 0.5 mg tablet twice daily for the next four days ?One 1.0 mg tablet twice daily starting at day 7  You should plan to quit smoking between one and four weeks after starting varenicline.   You should continue it for 12 weeks before concluding if it is working; if you successfully quit at 12 weeks, you may continue taking it for an additional 12 weeks. If you have not quit after taking varenicline for 12 weeks, talk to your health care provider about the next step. Options include working harder to make behavioral changes and continuing varenicline or switching to another treatment.  Common side effects of varenicline include nausea and abnormal dreams.  In the very early years of Chantix there were some concerns about Chantix affecting mood and depression.  However, there has been lots of research on this and this is no longer a concern.  Chantix is considered safe to use even in patients taking medication for depression.   In 2011, the [place] Food and Drug Administration (FDA) issued an advisory that, in people who already have heart or blood vessel disease, varenicline may increase the risk of acute heart problems. If there is such a risk, it appears to be small, and is likely outweighed by the benefits of quitting smoking.  What are the benefits of quitting smoking? Quitting smoking can lower your chances of getting or dying from heart disease, lung  disease, kidney failure, infection, or cancer. It can also lower your chances of getting osteoporosis, a condition that makes your bones weak. Plus, quitting smoking can help your skin look younger and reduce the chances that you will have problems with sex.  Quitting smoking will improve your health no matter how old you are, and no matter how long or how much you have smoked.  What should I do if I want to quit smoking? The letters in the word "START" can help you remember the steps to take: S = Set a quit date. T = Tell family, friends, and the people around you that you plan to quit. A = Anticipate or plan ahead for the tough times you'll face while quitting. R = Remove cigarettes and other tobacco products from your home, car, and work. T = Talk to your doctor about getting help to quit.  How can my doctor or nurse help? Your doctor or nurse can give you advice on the best way to quit. He or she can also put you in touch with counselors or other people you can call for support. Plus, your doctor or nurse can give you medicines to: ?Reduce your craving for cigarettes ?Reduce the unpleasant symptoms that happen when you stop smoking (called "withdrawal symptoms"). You can also get help from a free phone line (1-800-QUIT-NOW) or go online to [employer].  What are the symptoms of withdrawal? The symptoms include: ?Trouble sleeping ?Being irritable, anxious or restless ?Getting frustrated or angry ?Having trouble thinking  clearly  Some people who stop smoking become temporarily depressed. Some people need treatment for depression, such as counseling or antidepressant medicines. Depressed people might: ?No longer enjoy or care about doing the things they used to like to do ?Feel sad, down, hopeless, nervous, or cranky most of the day, almost every day ?Lose or gain weight ?Sleep too much or too little ?Feel tired or like they have no energy ?Feel guilty or like they are worth  nothing ?Forget things or feel confused ?Move and speak more slowly than usual ?Act restless or have trouble staying still ?Think about death or suicide  If you think you might be depressed, see your doctor or nurse. Only someone trained in mental health can tell for sure if you are depressed. If you ever feel like you might hurt yourself, go straight to the nearest emergency department. Or you can call for an ambulance (in the [place] and [place], [place] 9-1-1) or call your doctor or nurse right away and tell them it is an emergency. You can also reach the [place] National Suicide Prevention Lifeline at 203-528-3941 or http://walker-sanchez.info/.  How do medicines help you stop smoking? Different medicines work in different ways: ?Nicotine replacement therapy eases withdrawal and reduces your body's craving for nicotine, the main drug found in cigarettes. There are different forms of nicotine replacement, including skin patches, lozenges, gum, nasal sprays, and "puffers" or inhalers. Many can be bought without a prescription, while others might require one. ?Bupropion is a prescription medicine that reduces your desire to smoke. This medicine is sold under the brand names Zyban and Wellbutrin. It is also available in a generic version, which is cheaper than brand name medicines. ?Varenicline (brand names: Chantix, Champix) is a prescription medicine that reduces withdrawal symptoms and cigarette cravings. If you think you'd like to take varenicline and you have a history of depression, anxiety, or heart disease, discuss this with your doctor or nurse before taking the medicine. Varenicline can also increase the effects of alcohol in some people. It's a good idea to limit drinking while you're taking it, at least until you know how it affects you.  How does counseling work? Counseling can happen during formal office visits or just over the phone. A counselor can help you: ?Figure out what triggers your  smoking and what to do instead ?Overcome cravings ?Figure out what went wrong when you tried to quit before  What works best? Studies show that people have the best luck at quitting if they take medicines to help them quit and work with a Social worker. It might also be helpful to combine nicotine replacement with one of the prescription medicines that help people quit. In some cases, it might even make sense to take bupropion and varenicline together.  What about e-cigarettes? Sometimes people wonder if using electronic cigarettes, or "e-cigarettes," might help them quit smoking. Using e-cigarettes is also called "vaping." Doctors do not recommend e-cigarettes in place of medicines and counseling. That's because e-cigarettes still contain nicotine as well as other substances that might be harmful. It's not clear how they can affect a person's health in the long term.  Will I gain weight if I quit? Yes, you might gain a few pounds. But quitting smoking will have a much more positive effect on your health than weighing a few pounds more. Plus, you can help prevent some weight gain by being more active and eating less. Taking the medicine bupropion might help control weight gain.   What else  can I do to improve my chances of quitting? You can: ?Start exercising. ?Stay away from smokers and places that you associate with smoking. If people close to you smoke, ask them to quit with you. ?Keep gum, hard candy, or something to put in your mouth handy. If you get a craving for a cigarette, try one of these instead. ?Don't give up, even if you start smoking again. It takes most people a few tries before they succeed.  What if I am pregnant and I smoke? If you are pregnant, it's really important for the health of your baby that you quit. Ask your doctor what options you have, and what is safest for your baby

## 2019-08-30 ENCOUNTER — Telehealth: Payer: Self-pay | Admitting: Internal Medicine

## 2019-08-30 NOTE — Telephone Encounter (Signed)
Spoke with the pt and notified of response per [name] and she verbalized understanding and states nothing further needed.

## 2019-08-30 NOTE — Telephone Encounter (Signed)
Spoke with the pt  She asked if she needed a lung bx  I advised it looks like the next step in her work up is a ct scan, not a biopsy  Will do the ct and go from there  Pt verbalized understanding of this and nothing further needed

## 2019-08-31 ENCOUNTER — Telehealth: Payer: Self-pay

## 2019-08-31 DIAGNOSIS — E118 Type 2 diabetes mellitus with unspecified complications: Secondary | ICD-10-CM

## 2019-08-31 NOTE — Telephone Encounter (Signed)
-----   Message from Arnoldo Lenis, MD sent at 08/24/2019  9:03 AM EST ----- Regarding: RE: referral Ok to place these referals  J BrancH MD ----- Message ----- From: Drema Dallas, CMA Sent: 08/23/2019   4:37 PM EST To: Arnoldo Lenis, MD Subject: FW: referral                                    ----- Message ----- From: Arlana Lindau, RD Sent: 08/22/2019   8:45 AM EST To: Drema Dallas, CMA Subject: referral                                       HI Derinda Sis! I wanted to introduce myself. I am Jearld Fenton, dietitian/certified diabetes educator that works for [employer] here in [place] in the same office as [employer].  Mrs. Bibb contacted me to request to be seen. Can you have her provider put in a referral for her to see me? If you go to referrals, you can click on [facility].  That is [facility].  IF you have any questions, feel free to call me [phone]. The referral will go to our headquarters in [place] and then they will schedule pt on my schedule to be seen here in [place]. I work M-Th.  She also would like a referral to Dr. Leta [facility] for her diabetes management.  I have asked her to check BS 4 times a day; before meals til Tues and call your office with results for medication adjustment. She reports she is on 500 mg of Metformin BID. She notes her BS are in the 300's.  Thanks for all your help, Kieth Brightly

## 2019-08-31 NOTE — Telephone Encounter (Signed)
Referrals placed 

## 2019-09-04 ENCOUNTER — Telehealth: Payer: Self-pay | Admitting: Adult Health

## 2019-09-04 NOTE — Telephone Encounter (Signed)

## 2019-09-05 ENCOUNTER — Other Ambulatory Visit: Payer: Medicaid Other | Admitting: Adult Health

## 2019-09-11 ENCOUNTER — Telehealth: Payer: Self-pay | Admitting: Internal Medicine

## 2019-09-11 DIAGNOSIS — J441 Chronic obstructive pulmonary disease with (acute) exacerbation: Secondary | ICD-10-CM

## 2019-09-11 MED ORDER — PREDNISONE 10 MG PO TABS: ORAL_TABLET | ORAL | 0 refills | Status: AC

## 2019-09-11 NOTE — Telephone Encounter (Signed)
Called and spoke with pt who stated she needs a refill of the abx due to still wheezing and is also coughing occ with clear phlegm.  Pt states since her symptoms have not fully cleared up after last round of abx, she wants to know if something else can be prescribed. Dr. Shearon Stalls please advise.

## 2019-09-11 NOTE — Telephone Encounter (Signed)
Prednisone taper sent to pharmacy. She would benefit from the home copd program with [facility].  Lauren is this something you can help with?

## 2019-09-12 ENCOUNTER — Telehealth: Payer: Self-pay

## 2019-09-12 NOTE — Telephone Encounter (Signed)
I informed pt of Dr. Leeanne Rio recommendation and transferred to scheduler.

## 2019-09-12 NOTE — Telephone Encounter (Signed)
lmtcb for pt to inform her of the pred sent to pharmacy.

## 2019-09-12 NOTE — Telephone Encounter (Signed)
Pt called wanting to know if the MRI was accepted so that they can proceed with the surgery.

## 2019-09-12 NOTE — Telephone Encounter (Signed)
Right, we can go ahead and plan for surgery since in the past we had a hard time getting an MRI approved. She will need to make an appt for surgery consult Thanks Dr. Chauncey Cruel

## 2019-09-13 ENCOUNTER — Ambulatory Visit: Payer: Medicaid Other | Admitting: Sports Medicine

## 2019-09-13 NOTE — Telephone Encounter (Signed)
Called and spoke to pt. Pt did pick up the pred from pharmacy. Will leave message open for Lauren to follow up on this.

## 2019-09-14 NOTE — Telephone Encounter (Signed)
I have sent an email to my contact at [facility] for their COPD program, once I hear back I will up date the chart and patient.

## 2019-09-14 NOTE — Telephone Encounter (Signed)
Spoke with Crystal with [facility] and they do not take medicaid patients. Will let Dr. Shearon Stalls know

## 2019-09-14 NOTE — Telephone Encounter (Signed)
Lauren please advise.

## 2019-09-18 ENCOUNTER — Ambulatory Visit (HOSPITAL_COMMUNITY)
Admission: RE | Admit: 2019-09-18 | Discharge: 2019-09-18 | Disposition: A | Discharge status: Home / Self Care | Payer: Medicaid Other | Source: Ambulatory Visit | Attending: Internal Medicine | Admitting: Internal Medicine

## 2019-09-18 ENCOUNTER — Telehealth: Payer: Self-pay | Admitting: Internal Medicine

## 2019-09-18 ENCOUNTER — Other Ambulatory Visit: Payer: Self-pay

## 2019-09-18 DIAGNOSIS — F17200 Nicotine dependence, unspecified, uncomplicated: Secondary | ICD-10-CM

## 2019-09-18 DIAGNOSIS — J432 Centrilobular emphysema: Secondary | ICD-10-CM

## 2019-09-18 DIAGNOSIS — J849 Interstitial pulmonary disease, unspecified: Secondary | ICD-10-CM | POA: Diagnosis not present

## 2019-09-18 NOTE — Telephone Encounter (Signed)
It does not look like pt has ever been prescribed duoneb solution by our office, just albuterol solution.  Called pt to clarify the solution and pt stated duoneb used to be prescribed for her by Dr. Luan Pulling but he has retired and pt was referred to our office by another pt to take over care.  Pt stated that duoneb works well for her and she is hoping that she could get a refill of the sol. Dr. Shearon Stalls, please advise if you are okay with [place] sending rx for duoneb to pharmacy for pt.

## 2019-09-18 NOTE — Telephone Encounter (Signed)
Pt called back-- received wrong (?) prescription-- also wants to know if prescription can be mailed to her because of lack of transportation.

## 2019-09-18 NOTE — Telephone Encounter (Signed)
Spoke with pt, she is requesting duoneb Rx to be sent in because the plain albuterol doesn't work for her alone. Can we send in Rx to her pharmacy? ND please advise.

## 2019-09-19 MED ORDER — IPRATROPIUM-ALBUTEROL 0.5-2.5 (3) MG/3ML IN SOLN: 3.0000 mL | RESPIRATORY_TRACT | 5 refills | Status: DC | PRN

## 2019-09-19 NOTE — Telephone Encounter (Signed)
Called spoke with patient. Let her know medication was sent in. Nothing further needed at this time

## 2019-09-19 NOTE — Telephone Encounter (Signed)
duoneb sent to [facility].

## 2019-09-21 ENCOUNTER — Other Ambulatory Visit: Payer: Self-pay | Admitting: Sports Medicine

## 2019-09-21 ENCOUNTER — Ambulatory Visit: Payer: Medicaid Other | Admitting: Sports Medicine

## 2019-09-21 DIAGNOSIS — M79671 Pain in right foot: Secondary | ICD-10-CM

## 2019-09-24 ENCOUNTER — Other Ambulatory Visit: Payer: Medicaid Other | Admitting: Obstetrics & Gynecology

## 2019-09-24 ENCOUNTER — Telehealth: Payer: Self-pay | Admitting: Internal Medicine

## 2019-09-24 NOTE — Telephone Encounter (Signed)
Called and spoke with pt. Pt states she is still wheezing and feels like she may need abx with prednisone. Stated to pt that we could schedule her for a televisit and she verbalized understanding. Pt has been scheduled televisit tomorrow 3/23 with Beth.nothing further needed.

## 2019-09-25 ENCOUNTER — Ambulatory Visit (INDEPENDENT_AMBULATORY_CARE_PROVIDER_SITE_OTHER): Payer: Medicaid Other | Admitting: Primary Care

## 2019-09-25 ENCOUNTER — Encounter: Payer: Self-pay | Admitting: Primary Care

## 2019-09-25 ENCOUNTER — Other Ambulatory Visit: Payer: Self-pay

## 2019-09-25 ENCOUNTER — Other Ambulatory Visit: Payer: Medicaid Other | Admitting: Obstetrics & Gynecology

## 2019-09-25 DIAGNOSIS — J441 Chronic obstructive pulmonary disease with (acute) exacerbation: Secondary | ICD-10-CM | POA: Diagnosis not present

## 2019-09-25 MED ORDER — AZITHROMYCIN 250 MG PO TABS: ORAL_TABLET | ORAL | 0 refills | Status: DC

## 2019-09-25 NOTE — Progress Notes (Signed)
Virtual Visit via Telephone Note  I connected with [name] on 09/25/19 at 10:00 AM EDT by telephone and verified that I am speaking with the correct person using two identifiers.  Location: Patient: Home Provider: Home   I discussed the limitations, risks, security and privacy concerns of performing an evaluation and management service by telephone and the availability of in person appointments. I also discussed with the patient that there may be a patient responsible charge related to this service. The patient expressed understanding and agreed to proceed.   History of Present Illness: 64 year old female, current every day smoker. PMH significant for COPD (FEV1 1.46L/ 51%), respiratory failure (oxygen dependent), respiratory bronchiolitis associated ILD, GERD, DM, metabolic encephalopathy, polysubstance abuse, bipolar 1 disorder, benzodiazepine dependence. [name], last seen on 08/29/19 for consult. Maintained on Symbicort 160, prn combivent.    09/25/2019 Patient contacted today for acute televisit. Reports that her breathing is a little worse with associated wheezing. States that she is coughing more but nothing is coming up. He cough is occcasionally productive with yellowish/green mucus. She states that prednisone course has already been called in for her but she feels she needs an antibiotic. She is still smoking 15 cigarettes a day. She may consider pharmacy consult if able to do televisit.   Observations/Objective:  - Able to speak in full sentences - No respiratory distress observed   Assessment and Plan:  COPD exacerbation: - Cough and wheezing; Prednisone already sent  - Recommend patient continue mucinex twice daily - Symbicort 160 twp puffs twice daily; combivent QID - RX: Zpack   Tobacco abuse - Patient will consider pharmacy consult for smoking cessation if able to do televisit   Follow Up Instructions:   - Return/call if symptoms do not  improve or worsen  I discussed the assessment and treatment plan with the patient. The patient was provided an opportunity to ask questions and all were answered. The patient agreed with the plan and demonstrated an understanding of the instructions.   The patient was advised to call back or seek an in-person evaluation if the symptoms worsen or if the condition fails to improve as anticipated.  I provided 18 minutes of non-face-to-face time during this encounter.   Martyn Ehrich, NP

## 2019-09-25 NOTE — Patient Instructions (Signed)
Continue prednisone pack and symbicort twice daily Take mucinex twice daily Sent in zpack Return if no improvement in 5-7 days

## 2019-09-26 NOTE — Telephone Encounter (Signed)
Thanks! Yes, We did briefly discuss her CT results. I mentioned that she may benefit from sleep study/CPAP or potential surgical treatment with stenting.

## 2019-09-26 NOTE — Telephone Encounter (Signed)
Was seen in sick visit with Derl Barrow NP yesterday - televisit. On her CT scan she has severe tracheomalacia which is the most likely the cause of her persistent wheezing in the setting of ongoing tobacco use and recurrent prednisone courses.  (see CT scan dated 3/16 Series 10, Image 6.)    [facility] home RT program does not take medicaid. Best option would be to get her Bipap at home to help stent her airway open - we can likely get her to meet criteria with a room air ABG or new spirometry.  Can we please set her up to have a televisit with me in a few weeks to discuss her breathing further and follow up from yesterday?

## 2019-09-27 NOTE — Telephone Encounter (Signed)
Called and spoke with patient. She is now scheduled for TELEVISIT with Dr. Shearon Stalls on 4/15 at 9am. Nothing further needed at this time.

## 2019-10-03 ENCOUNTER — Telehealth: Payer: Self-pay | Admitting: Primary Care

## 2019-10-03 NOTE — Telephone Encounter (Signed)
Called and spoke with Patient.  Dr. Mauricio Po recommendations given. Patient requested televisit 10/08/19.  Patient scheduled with Eustaquio Maize, NP, at 1500, for tele visit. Nothing further at this time.

## 2019-10-03 NOTE — Telephone Encounter (Signed)
Called and spoke with the pt. She states that approx 4 wks ago she had episode where she choked on her saliva, then she became SOB and her throat was very dry. She states she had a mild episode similar to this last night. She is having a dry cough- taking mucinex, symbicort, pred taper and just finished Zpack from televisit with Beth on 3/23. Still smoking.  She is scheduled for appt with [name] for televisit on 4/15 and wants sooner advice. Please advise, thanks!

## 2019-10-03 NOTE — Telephone Encounter (Signed)
Ok to get her on for a sooner acute televisit if she wants to talk to someone sooner than appt with me on April 15th. I'll be inpatient all next week so she might need to be seen with someone else.

## 2019-10-08 ENCOUNTER — Other Ambulatory Visit: Payer: Self-pay

## 2019-10-08 ENCOUNTER — Ambulatory Visit (INDEPENDENT_AMBULATORY_CARE_PROVIDER_SITE_OTHER): Payer: Medicaid Other | Admitting: Primary Care

## 2019-10-08 ENCOUNTER — Encounter: Payer: Self-pay | Admitting: Primary Care

## 2019-10-08 DIAGNOSIS — J398 Other specified diseases of upper respiratory tract: Secondary | ICD-10-CM

## 2019-10-08 DIAGNOSIS — J449 Chronic obstructive pulmonary disease, unspecified: Secondary | ICD-10-CM | POA: Diagnosis not present

## 2019-10-08 NOTE — Progress Notes (Signed)
Virtual Visit via Telephone Note  I connected with [name] on 10/08/19 at  3:00 PM EDT by telephone and verified that I am speaking with the correct person using two identifiers.  Location: Patient: home Provider: home   I discussed the limitations, risks, security and privacy concerns of performing an evaluation and management service by telephone and the availability of in person appointments. I also discussed with the patient that there may be a patient responsible charge related to this service. The patient expressed understanding and agreed to proceed.   History of Present Illness: 64 year old female, current every day smoker. PMH significant for COPD (FEV1 1.46L/ 51%), severe tacheomalacia, respiratory failure (oxygen dependent), respiratory bronchiolitis associated ILD, GERD, DM, metabolic encephalopathy, polysubstance abuse, bipolar 1 disorder, benzodiazepine dependence. [name], last seen on 08/29/19 for consult. Maintained on Symbicort 160, prn combivent.    10/08/2019 She had a episode today where she reports chocking on her mucus where she was gasping for her breath. She was able to recover after a few minutes and using duoneb breathing treatment. She wears oxygen at night. Uses nebulizer four times a day. Takes mucinex twice a day and nexium daily. She has not been using her flutter valve. She does not have a oximeter to check her oxygen level. She is on 4.5L oxygen.     Observations/Objective:  -O2 level unable to check at home  Assessment and Plan:  Tracheomalacia: - Reports episode of chocking/strangling sensation in throat  - Severe collapse of trachea during expiration on HRCT  - Will discuss with Dr. Shearon Stalls about ordering in-lab sleep study for possible CPAP treatment versus referral for stenting of airway   Chronic respiratory failure with hypoxemia: - O2 dependent. Continue 4-5L oxygen at home - Recommending patient get oximeter to monitor O2  levels at home keep >88-90%  COPD: - Continue Symbicort 160 two puffs twice daily; combivent/duoneb QID - Continue airway clearance with mucinex and flutter valve   Follow Up Instructions: - Will follow-up with patient after I discuss further with Dr. Shearon Stalls    I discussed the assessment and treatment plan with the patient. The patient was provided an opportunity to ask questions and all were answered. The patient agreed with the plan and demonstrated an understanding of the instructions.   The patient was advised to call back or seek an in-person evaluation if the symptoms worsen or if the condition fails to improve as anticipated.  I provided 18 minutes of non-face-to-face time during this encounter.   Martyn Ehrich, NP

## 2019-10-08 NOTE — Patient Instructions (Signed)
Tracheomalacia: - Reports episode of chocking/strangling sensation in throat  - Severe collapse of trachea during expiration on HRCT  - Will discuss with Dr. Shearon Stalls about ordering in-lab sleep study for possible CPAP treatment versus referral for stenting of airway   COPD: - Continue Symbicort 160 two puffs twice daily; combivent/duoneb QID - Continue airway clearance with mucinex and flutter valve

## 2019-10-09 ENCOUNTER — Ambulatory Visit: Payer: Medicaid Other | Admitting: Nutrition

## 2019-10-16 ENCOUNTER — Ambulatory Visit: Payer: Medicaid Other | Admitting: Sports Medicine

## 2019-10-18 ENCOUNTER — Telehealth: Payer: Self-pay | Admitting: *Deleted

## 2019-10-18 ENCOUNTER — Ambulatory Visit (INDEPENDENT_AMBULATORY_CARE_PROVIDER_SITE_OTHER): Payer: Medicaid Other | Admitting: Internal Medicine

## 2019-10-18 ENCOUNTER — Other Ambulatory Visit: Payer: Self-pay

## 2019-10-18 DIAGNOSIS — J441 Chronic obstructive pulmonary disease with (acute) exacerbation: Secondary | ICD-10-CM

## 2019-10-18 NOTE — Progress Notes (Signed)
Patient called on phone x 3. No answer. Voicemail left.

## 2019-10-22 ENCOUNTER — Telehealth: Payer: Self-pay | Admitting: *Deleted

## 2019-10-22 ENCOUNTER — Other Ambulatory Visit: Payer: Self-pay

## 2019-10-22 ENCOUNTER — Ambulatory Visit (INDEPENDENT_AMBULATORY_CARE_PROVIDER_SITE_OTHER): Payer: Medicaid Other | Admitting: Primary Care

## 2019-10-22 ENCOUNTER — Encounter: Payer: Self-pay | Admitting: Primary Care

## 2019-10-22 DIAGNOSIS — J398 Other specified diseases of upper respiratory tract: Secondary | ICD-10-CM

## 2019-10-22 DIAGNOSIS — J449 Chronic obstructive pulmonary disease, unspecified: Secondary | ICD-10-CM

## 2019-10-22 DIAGNOSIS — R0683 Snoring: Secondary | ICD-10-CM | POA: Diagnosis not present

## 2019-10-22 DIAGNOSIS — J9611 Chronic respiratory failure with hypoxia: Secondary | ICD-10-CM

## 2019-10-22 MED ORDER — BUPROPION HCL ER (SR) 150 MG PO TB12: 150.0000 mg | ORAL_TABLET | Freq: Every day | ORAL | 0 refills | Status: DC

## 2019-10-22 NOTE — Progress Notes (Signed)
Virtual Visit via Telephone Note  I connected with [name] on 10/22/19 at 11:00 AM EDT by telephone and verified that I am speaking with the correct person using two identifiers.  Location: Patient: Home Provider: Office   I discussed the limitations, risks, security and privacy concerns of performing an evaluation and management service by telephone and the availability of in person appointments. I also discussed with the patient that there may be a patient responsible charge related to this service. The patient expressed understanding and agreed to proceed.   History of Present Illness: 64 year old female, current every day smoker. PMH significant for COPD (FEV1 1.46L/ 51%), severe tacheomalacia, respiratory failure (oxygen dependent), respiratory bronchiolitis associated ILD, GERD, DM, metabolic encephalopathy, polysubstance abuse, bipolar 1 disorder, benzodiazepine dependence. [name], seen on 08/29/19 for consult. Maintained on Symbicort 160, combivent.    Previous [facility] pulmonary encounter: 10/08/2019 She had a episode today where she reports chocking on her mucus where she was gasping for her breath. She was able to recover after a few minutes and using duoneb breathing treatment. She wears oxygen at night. Uses nebulizer four times a day. Takes mucinex twice a day and nexium daily. She has not been using her flutter valve. She does not have a oximeter to check her oxygen level. She is on 4.5L oxygen.     10/22/2019 Patient contacted today for follow-up. She did not fill azithromycin prescription initially d/t cost. She has one day left and feels it has helped her congestion a lot. She has also been taking mucinex twice a day. She avoids going outside d/t pollen. She takes zytrec daily and is compliant with nexium 20mg . She hasn't started chantix d/t hx anxiety and nightmares. She reports loud snoring at night. She has not tried wellbutrin but is interested in a  medication option to help her quit smoking. She is trying to cut back use. Consider repeating PFTs  Observations/Objective:  - Mild upper airway wheezing/stridor  Assessment and Plan:  Tracheomalacia: - Optimize treatment of underlying COPD  - Continue airway clearance with mucinex - Consider CPAP at night if appropriate  - Stenting likely not best treatment option long term, discuss with Dr. Shearon Stalls (patient has an apt scheduled)  Snoring: - Needs HST to r/o sleep apnea   Chronic respiratory failure with hypoxemia: - Continue 4-5 L oxygen to keep O2 > 88-90%  COPD: - Continue Symbicort 160 two puff twice daily; combivent qid - Consider repeat PFTs, last done in 2018  Tobacco abuse: - Still smoking, interested in quitting - Start Wellbutrin 150mg  daily - FU in 1 week   Follow Up Instructions:   - April 16th with Dr. Shearon Stalls   I discussed the assessment and treatment plan with the patient. The patient was provided an opportunity to ask questions and all were answered. The patient agreed with the plan and demonstrated an understanding of the instructions.   The patient was advised to call back or seek an in-person evaluation if the symptoms worsen or if the condition fails to improve as anticipated.  I provided 18 minutes of non-face-to-face time during this encounter.   Martyn Ehrich, NP

## 2019-10-22 NOTE — Addendum Note (Signed)
Addended by: Vanessa Barbara on: 10/22/2019 05:31 PM   Modules accepted: Orders

## 2019-10-22 NOTE — Patient Instructions (Addendum)
Orders: Home Sleep study LU:[id number] resp failure  (if not already ordered)  Rx: Wellbutrin 150mg  - take one tablet daily   Follow-up: 1 week with Dr. Shearon Stalls (already scheduled)

## 2019-10-22 NOTE — Addendum Note (Signed)
Addended by: Vanessa Barbara on: 10/22/2019 01:47 PM   Modules accepted: Orders

## 2019-10-23 ENCOUNTER — Ambulatory Visit: Payer: Medicaid Other | Admitting: Sports Medicine

## 2019-10-23 ENCOUNTER — Other Ambulatory Visit (HOSPITAL_BASED_OUTPATIENT_CLINIC_OR_DEPARTMENT_OTHER): Payer: Self-pay

## 2019-10-29 ENCOUNTER — Other Ambulatory Visit: Payer: Self-pay

## 2019-10-29 ENCOUNTER — Ambulatory Visit (INDEPENDENT_AMBULATORY_CARE_PROVIDER_SITE_OTHER): Payer: Medicaid Other | Admitting: Internal Medicine

## 2019-10-29 ENCOUNTER — Encounter: Payer: Self-pay | Admitting: Internal Medicine

## 2019-10-29 ENCOUNTER — Telehealth: Payer: Self-pay | Admitting: *Deleted

## 2019-10-29 DIAGNOSIS — J449 Chronic obstructive pulmonary disease, unspecified: Secondary | ICD-10-CM

## 2019-10-29 DIAGNOSIS — J398 Other specified diseases of upper respiratory tract: Secondary | ICD-10-CM

## 2019-10-29 DIAGNOSIS — F17200 Nicotine dependence, unspecified, uncomplicated: Secondary | ICD-10-CM

## 2019-10-29 NOTE — Progress Notes (Signed)
Virtual Visit via Video Note  I connected with [name] on 10/29/19 at  2:45 PM EDT by a video enabled telemedicine application and verified that I am speaking with the correct person using two identifiers.  Location: Patient: Home Provider: Office - [facility] - [address], [address], [place], [place] [address]  I discussed the limitations of evaluation and management by telemedicine and the availability of in person appointments. The patient expressed understanding and agreed to proceed. I also discussed with the patient that there may be a patient responsible charge related to this service. The patient expressed understanding and agreed to proceed.  Patient consented to consult via telephone: Yes People present and their role in pt care: Pt   History of Present Illness: [name] is a 30 old woman with ongoing tobacco use disorder, COPD on home oxygen, and severe tracheomalacia who presents for follow-up.    Chief complaint:  She has seen Derl Barrow via televisit a couple of times.  We have been communicating about ongoing management of her tracheomalacia.  Plan is for a sleep study which is now scheduled on May 10.  [name] describes a choking sensation when she lays flat at night and tries to pick up her oxygen.  Unfortunately when she turns up her oxygen her nose gets very dry and she has congestion in her nasal passages.  No worsening chest tightness or wheezing.  Observations/Objective: She is able to complete full sentences over the phone.  No coughing.  No wheezing. Social History   Tobacco Use  Smoking Status Current Every Day Smoker  . Packs/day: 1.50  . Years: 51.00  . Pack years: 76.50  . Types: Cigarettes  . Start date: 1969  Smokeless Tobacco Never Used  Tobacco Comment   10/22/19- 12 cigs in a 24 hour period   Immunization History  Administered Date(s) Administered  . Influenza Whole 03/29/2019  . Pneumococcal Polysaccharide-23  07/06/2014  . Tdap 11/30/2013    Assessment and Plan: Chronic obstructive pulmonary disease Chronic hypoxemic respiratory failure Severe tracheomalacia Ongoing tobacco use disorder   Follow Up Instructions:  Continue current inhaler therapies. Continue home oxygen I recommend nasal saline rinses and water-based lubricant applied gently on the inside of her nares to help with dryness from home oxygen. She will follow up with [place] after her sleep study and after we been able to get her a CPAP machine at home.  I discussed the assessment and treatment plan with the patient. The patient was provided an opportunity to ask questions and all were answered. The patient agreed with the plan and demonstrated an understanding of the instructions.   The patient was advised to call back or seek an in-person evaluation if the symptoms worsen or if the condition fails to improve as anticipated.  I provided 26 minutes of non-face-to-face time during this encounter.   Spero Geralds, MD

## 2019-11-02 NOTE — Progress Notes (Signed)
Test already ordered.

## 2019-11-05 ENCOUNTER — Telehealth: Payer: Self-pay | Admitting: Internal Medicine

## 2019-11-05 MED ORDER — DOXYCYCLINE HYCLATE 100 MG PO TABS: 100.0000 mg | ORAL_TABLET | Freq: Two times a day (BID) | ORAL | 0 refills | Status: DC

## 2019-11-05 NOTE — Telephone Encounter (Signed)
Spoke with pt, aware of recs.  rx already sent to pharmacy by [facility].  Nothing further needed at this time- will close encounter.

## 2019-11-05 NOTE — Telephone Encounter (Signed)
Yes this is appropriate. Will send in Rx for doxycycline. Continue mucinex twice daily, Symbicort, prn albuterol and flonase.

## 2019-11-05 NOTE — Telephone Encounter (Signed)
Pt c/o increased sob, wheezing, prod cough with yellow mucus, sinus congestion, PND, chest congestion X2 days. Denies fever, HA, loss of taste/smell, chest pains.     Taking Mucinex, albuterol, flonase, symbicort to help with s/s.  Pt is requesting an abx. Pt was given pred taper and zpak on 09/25/19 office visit.    Pharmacy: [facility] to APP since Dr. Shearon Stalls is not in office.  Beth please advise.  Thanks!

## 2019-11-06 ENCOUNTER — Other Ambulatory Visit: Payer: Self-pay

## 2019-11-06 ENCOUNTER — Other Ambulatory Visit (HOSPITAL_COMMUNITY)
Admission: RE | Admit: 2019-11-06 | Discharge: 2019-11-06 | Disposition: A | Discharge status: Home / Self Care | Payer: Medicaid Other | Source: Ambulatory Visit | Attending: Pulmonary Disease | Admitting: Pulmonary Disease

## 2019-11-06 DIAGNOSIS — Z01812 Encounter for preprocedural laboratory examination: Secondary | ICD-10-CM | POA: Diagnosis not present

## 2019-11-06 DIAGNOSIS — Z20822 Contact with and (suspected) exposure to covid-19: Secondary | ICD-10-CM | POA: Diagnosis not present

## 2019-11-07 LAB — SARS CORONAVIRUS 2 (TAT 6-24 HRS): SARS Coronavirus 2: NEGATIVE

## 2019-11-08 ENCOUNTER — Telehealth: Payer: Self-pay | Admitting: Internal Medicine

## 2019-11-08 ENCOUNTER — Ambulatory Visit: Payer: Medicaid Other | Attending: Primary Care | Admitting: Pulmonary Disease

## 2019-11-08 ENCOUNTER — Other Ambulatory Visit: Payer: Self-pay

## 2019-11-08 DIAGNOSIS — R0683 Snoring: Secondary | ICD-10-CM

## 2019-11-08 DIAGNOSIS — J449 Chronic obstructive pulmonary disease, unspecified: Secondary | ICD-10-CM

## 2019-11-08 DIAGNOSIS — R0902 Hypoxemia: Secondary | ICD-10-CM | POA: Insufficient documentation

## 2019-11-08 DIAGNOSIS — G4734 Idiopathic sleep related nonobstructive alveolar hypoventilation: Secondary | ICD-10-CM

## 2019-11-08 DIAGNOSIS — J398 Other specified diseases of upper respiratory tract: Secondary | ICD-10-CM

## 2019-11-08 NOTE — Telephone Encounter (Signed)
Spoke with pt. She is aware that she needs a visit. Pt does not have the capabilities to do a video visit. Televisit has been scheduled with Beth on 11/09/19 at 1330. Nothing further was needed.

## 2019-11-08 NOTE — Telephone Encounter (Signed)
Aaron Edelman please advise:  Patient was sent Rx for doxycycline on 5/2 and is asking if she can get a prescription for Prednisone. States that she is still wheezing and not feeling much better  [facility]

## 2019-11-08 NOTE — Telephone Encounter (Signed)
Please schedule patient for office visit or virtual visit with [facility] NP or Dr. Shearon Stalls.Rebekah Quaker, FNP

## 2019-11-09 ENCOUNTER — Ambulatory Visit (INDEPENDENT_AMBULATORY_CARE_PROVIDER_SITE_OTHER): Payer: Medicaid Other | Admitting: Primary Care

## 2019-11-09 ENCOUNTER — Encounter: Payer: Self-pay | Admitting: Primary Care

## 2019-11-09 DIAGNOSIS — J441 Chronic obstructive pulmonary disease with (acute) exacerbation: Secondary | ICD-10-CM | POA: Diagnosis not present

## 2019-11-09 MED ORDER — PREDNISONE 10 MG PO TABS: ORAL_TABLET | ORAL | 0 refills | Status: DC

## 2019-11-09 NOTE — Patient Instructions (Signed)
COPD exacerbation: - Continue Doxycycline course until complete - Continue Symbicort 160 two puffs twice daily; recommend patient use ipratropium-albuterol q4-6 hours as needed for shortness of breath/wheezing  - Sending in prednisone taper (40mg  x3 days; 30mg  x 3 days; 20mg  x 3 days; 10mg  x 3 days)  Severe tracheomalacia: - Follow-up sleep study once results available

## 2019-11-09 NOTE — Procedures (Signed)
    Patient Name: [name], Pro Date: 11/08/2019 Gender: Female D.O.B: Nov 13, 1955 Age (years): 23 Referring Provider: Geraldo Pitter NP Height (inches): 87 Interpreting Physician: Chesley Mires MD, ABSM Weight (lbs): 225 RPSGT: Peak, Robert BMI: 39 MRN: [id number] Neck Size: 16.50  CLINICAL INFORMATION Sleep Study Type: NPSG  Indication for sleep study: history of COPD and tracheomalacia on home oxygen, snoring.  Presents for assessment of sleep disordered breathing.  SLEEP STUDY TECHNIQUE As per the AASM Manual for the Scoring of Sleep and Associated Events v2.3 (April 2016) with a hypopnea requiring 4% desaturations.  The channels recorded and monitored were frontal, central and occipital EEG, electrooculogram (EOG), submentalis EMG (chin), nasal and oral airflow, thoracic and abdominal wall motion, anterior tibialis EMG, snore microphone, electrocardiogram, and pulse oximetry.  MEDICATIONS Medications self-administered by patient taken the night of the study : N/A  SLEEP ARCHITECTURE The study was initiated at 9:57:34 PM and ended at 4:51:58 AM.  Sleep onset time was 44.7 minutes and the sleep efficiency was 67.4%%. The total sleep time was 279.5 minutes.  Stage REM latency was 140.0 minutes.  The patient spent 5.0%% of the night in stage N1 sleep, 80.7%% in stage N2 sleep, 3.4%% in stage N3 and 10.9% in REM.  Alpha intrusion was absent.  Supine sleep was 0.00%.  RESPIRATORY PARAMETERS The overall apnea/hypopnea index (AHI) was 0.4 per hour. There were 0 total apneas, including 0 obstructive, 0 central and 0 mixed apneas. There were 2 hypopneas and 5 RERAs.  The AHI during Stage REM sleep was 2.0 per hour.  AHI while supine was N/A per hour.  The mean oxygen saturation was 91.5%. The minimum SpO2 during sleep was 75.0%.  She required the use of 2 liters oxygen during this study.  soft snoring was noted during this study.  CARDIAC DATA The 2 lead  EKG demonstrated sinus rhythm. The mean heart rate was 97.7 beats per minute. Other EKG findings include: None.  LEG MOVEMENT DATA The total PLMS were 0 with a resulting PLMS index of 0.0. Associated arousal with leg movement index was 0.4 .  IMPRESSIONS - She did not have significant obstructive or central sleep apnea.  Her overall AHI was 0.4/hr. - Her SpO2 low was 75%.  She spent 95.8 minutes of test time with a SpO2 < 88%.  She did well with the addition of 2 liters supplemental oxygen.  DIAGNOSIS - Nocturnal Hypoxemia (327.26 [G47.36 ICD-10])  RECOMMENDATIONS - She does not qualify for PAP therapy based on this study. - Could assess for non-invasive positive pressure ventilation on the basis of chronic respiratory failure.  She will need follow up in the pulmonary clinic to assess this further.  [Electronically signed] 11/09/2019 02:48 PM  Chesley Mires MD, [place], American Board of Sleep Medicine   NPI: [id number]

## 2019-11-09 NOTE — Progress Notes (Signed)
Virtual Visit via Telephone Note  I connected with [name] on 11/09/19 at  1:30 PM EDT by telephone and verified that I am speaking with the correct person using two identifiers.  Location: Patient: Home Provider: Office   I discussed the limitations, risks, security and privacy concerns of performing an evaluation and management service by telephone and the availability of in person appointments. I also discussed with the patient that there may be a patient responsible charge related to this service. The patient expressed understanding and agreed to proceed.   History of Present Illness:  64 year old female, current every day smoker. PMH significant for COPD (FEV1 1.46L/ 51%), severe tacheomalacia, respiratory failure (oxygen dependent), respiratory bronchiolitis associated ILD, GERD, DM, metabolic encephalopathy, polysubstance abuse, bipolar 1 disorder, benzodiazepine dependence. [name], seen on 08/29/19 for consult. Maintained on Symbicort 160, as needed combivent/duonebs.    Previous [facility] pulmonary encounter: 10/08/2019 She had a episode today where she reports chocking on her mucus where she was gasping for her breath. She was able to recover after a few minutes and using duoneb breathing treatment. She wears oxygen at night. Uses nebulizer four times a day. Takes mucinex twice a day and nexium daily. She has not been using her flutter valve. She does not have a oximeter to check her oxygen level. She is on 4.5L oxygen.     10/22/2019 Patient contacted today for follow-up. She did not fill azithromycin prescription initially d/t cost. She has one day left and feels it has helped her congestion a lot. She has also been taking mucinex twice a day. She avoids going outside d/t pollen. She takes zytrec daily and is compliant with nexium 20mg . She hasn't started chantix d/t hx anxiety and nightmares. She reports loud snoring at night. She has not tried wellbutrin but is  interested in a medication option to help her quit smoking. She is trying to cut back use. Consider repeating PFTs  11/09/2019 Patient contacted today for acute visit. She called earlier this week with reports of productive cough with associated shortness of breath and wheezing. Send in prescription for doxycyline. No improvement. Nasal congestion has loosed up some today. She is taking her Symbicort two puffs twice a day. Uses albuterol nebulizer 3-4 times a day. She had in-lab sleep study last night, results not available. She has questions about getting covid vaccine.    Observations/Objective:  - Able to speak in full sentences, no overt shortness of breath or wheezing.  Assessment and Plan:  COPD exacerbation: - Productive cough with associated shortness of breath/wheezing  - Continue Doxycycline course until complete - Continue Symbicort 160 two puffs twice daily; recommend patient use ipratropium-albuterol q4-6 hours as needed for shortness of breath/wheezing  - Sending in prednisone taper (40mg  x3 days; 30mg  x 3 days; 20mg  x 3 days; 10mg  x 3 days) - Recommend getting covid vaccine once improved from acute exacerbation and off oral steriods   Severe tracheomalacia: - Completed sleep study last night, results pending - Recommend CPAP if clinically indicated for treatment   Follow Up Instructions:   - After sleep study to review results I discussed the assessment and treatment plan with the patient. The patient was provided an opportunity to ask questions and all were answered. The patient agreed with the plan and demonstrated an understanding of the instructions.   The patient was advised to call back or seek an in-person evaluation if the symptoms worsen or if the condition fails to  improve as anticipated.  I provided 15 minutes of non-face-to-face time during this encounter.   Martyn Ehrich, NP

## 2019-11-12 ENCOUNTER — Other Ambulatory Visit: Payer: Medicaid Other | Admitting: Adult Health

## 2019-11-20 ENCOUNTER — Ambulatory Visit (INDEPENDENT_AMBULATORY_CARE_PROVIDER_SITE_OTHER): Payer: Medicaid Other | Admitting: Sports Medicine

## 2019-11-20 ENCOUNTER — Encounter: Payer: Self-pay | Admitting: Sports Medicine

## 2019-11-20 ENCOUNTER — Other Ambulatory Visit: Payer: Self-pay

## 2019-11-20 DIAGNOSIS — M2021 Hallux rigidus, right foot: Secondary | ICD-10-CM | POA: Diagnosis not present

## 2019-11-20 DIAGNOSIS — M6740 Ganglion, unspecified site: Secondary | ICD-10-CM

## 2019-11-20 DIAGNOSIS — M1990 Unspecified osteoarthritis, unspecified site: Secondary | ICD-10-CM

## 2019-11-20 DIAGNOSIS — M79671 Pain in right foot: Secondary | ICD-10-CM

## 2019-11-20 NOTE — Patient Instructions (Signed)
Pre-Operative Instructions  Congratulations, you have decided to take an important step towards improving your quality of life.  You can be assured that the doctors and staff at [facility] will be with you every step of the way.  Here are some important things you should know:  1. Plan to be at the surgery center/hospital at least 1 (one) hour prior to your scheduled time, unless otherwise directed by the surgical center/hospital staff.  You must have a responsible adult accompany you, remain during the surgery and drive you home.  Make sure you have directions to the surgical center/hospital to ensure you arrive on time. 2. If you are having surgery at [facility] or [facility] hospitals, you will need a copy of your medical history and physical form from your family physician within one month prior to the date of surgery. We will give you a form for your primary physician to complete.  3. We make every effort to accommodate the date you request for surgery.  However, there are times where surgery dates or times have to be moved.  We will contact you as soon as possible if a change in schedule is required.   4. No aspirin/ibuprofen for one week before surgery.  If you are on aspirin, any non-steroidal anti-inflammatory medications (Mobic, Aleve, Ibuprofen) should not be taken seven (7) days prior to your surgery.  You make take Tylenol for pain prior to surgery.  5. Medications - If you are taking daily heart and blood pressure medications, seizure, reflux, allergy, asthma, anxiety, pain or diabetes medications, make sure you notify the surgery center/hospital before the day of surgery so they can tell you which medications you should take or avoid the day of surgery. 6. No food or drink after midnight the night before surgery unless directed otherwise by surgical center/hospital staff. 7. No alcoholic beverages 24-hours prior to surgery.  No smoking 24-hours prior or 24-hours after  surgery. 8. Wear loose pants or shorts. They should be loose enough to fit over bandages, boots, and casts. 9. Don't wear slip-on shoes. Sneakers are preferred. 10. Bring your boot with you to the surgery center/hospital.  Also bring crutches or a walker if your physician has prescribed it for you.  If you do not have this equipment, it will be provided for you after surgery. 11. If you have not been contacted by the surgery center/hospital by the day before your surgery, call to confirm the date and time of your surgery. 12. Leave-time from work may vary depending on the type of surgery you have.  Appropriate arrangements should be made prior to surgery with your employer. 13. Prescriptions will be provided immediately following surgery by your doctor.  Fill these as soon as possible after surgery and take the medication as directed. Pain medications will not be refilled on weekends and must be approved by the doctor. 14. Remove nail polish on the operative foot and avoid getting pedicures prior to surgery. 15. Wash the night before surgery.  The night before surgery wash the foot and leg well with water and the antibacterial soap provided. Be sure to pay special attention to beneath the toenails and in between the toes.  Wash for at least three (3) minutes. Rinse thoroughly with water and dry well with a towel.  Perform this wash unless told not to do so by your physician.  Enclosed: 1 Ice pack (please put in freezer the night before surgery)   1 Hibiclens skin cleaner     Pre-op instructions  If you have any questions regarding the instructions, please do not hesitate to call our office.  [place]: [address], [place], [place] [address] -- [phone]  [place]: [address] [place], [place] [address] -- [phone]  [place]: [address], [place], [place] [address] -- [phone]   Website: [email or web address] 

## 2019-11-20 NOTE — Progress Notes (Signed)
Subjective: [name] is a 64 y.o. female patient who returns to office for follow up evaluation of right foot pain. Patient reports her pain has come back over the right great toe joint reports that the previous aspiration helps some but he ended fluid still came back hurts now with rubbing in shoes over the top of her right great toe joint and reports that she never had a chance to go for the MRI but wants to further discuss the surgical options.  Patient reports that she is currently on oxygen and denies any other changes with health or medical condition since last encounter.  Patient Active Problem List   Diagnosis Date Noted  . Ganglion cyst of dorsum of left wrist s/p removal 09/07/18   . DM (diabetes mellitus), type 2 ([place]) 08/30/2017  . Respiratory bronchiolitis associated interstitial lung disease ([place]) 08/30/2017  . Hyperglycemia 08/26/2017  . Chronic pain 08/25/2017  . COPD with acute exacerbation ([place]) 08/25/2017  . Alcohol abuse 08/01/2015  . Anorgasmia of female 01/01/2015  . Low grade squamous intraepithelial lesion (LGSIL) on Papanicolaou smear of cervix 12/04/2014  . Abdominal pain, chronic, epigastric 11/20/2014  . Dysphagia, pharyngoesophageal phase   . Hx of colonic polyps   . COPD exacerbation ([place]) 07/05/2014  . GERD without esophagitis 07/05/2014  . Hypertension 07/05/2014  . Bipolar 1 disorder ([place]) 07/05/2014  . Anxiety 07/05/2014  . Polysubstance abuse ([place]) 07/05/2014  . Obesity (BMI 30-39.9) 07/05/2014  . Encephalopathy, metabolic 00/93/8182  . Personal history of colonic polyps 04/01/2014  . Mild dysplasia of cervix 11/13/2013  . Acute respiratory failure with hypoxia ([place]) 07/08/2013  . Benzodiazepine dependence ([place]) 08/26/2012  . COPD (chronic obstructive pulmonary disease) ([place]) 08/25/2012  . Panic disorder 08/25/2012  . Tobacco use disorder 08/25/2012  . Hyperlipidemia 08/25/2012    Current Outpatient Medications on File Prior to Visit   Medication Sig Dispense Refill  . Accu-Chek FastClix Lancets MISC USE TO CHECK BLOODOGLUCOSE ONCE DAILY    . ACCU-CHEK GUIDE test strip USE TO CHECK BLOODEBLUCOSE ONCE DAILY.    Marland Kitchen albuterol (PROVENTIL) (2.5 MG/3ML) 0.083% nebulizer solution Take 3 mLs (2.5 mg total) by nebulization every 4 (four) hours as needed for wheezing or shortness of breath. 180 mL 2  . albuterol-ipratropium (COMBIVENT) 18-103 MCG/ACT inhaler Inhale 2 puffs into the lungs 4 (four) times daily.     Marland Kitchen atorvastatin (LIPITOR) 10 MG tablet Take 10 mg by mouth daily.    . budesonide-formoterol (SYMBICORT) 160-4.5 MCG/ACT inhaler Inhale 2 puffs into the lungs in the morning and at bedtime. 1 Inhaler 5  . buPROPion (WELLBUTRIN SR) 150 MG 12 hr tablet Take 1 tablet (150 mg total) by mouth daily. 30 tablet 0  . celecoxib (CELEBREX) 100 MG capsule Take 100 mg by mouth 2 (two) times daily.    . cetirizine (ZYRTEC) 10 MG tablet Take 10 mg by mouth daily.    . citalopram (CELEXA) 20 MG tablet Take 20 mg by mouth daily.    . cycloSPORINE (RESTASIS) 0.05 % ophthalmic emulsion Place 1 drop into both eyes 2 (two) times daily.    . diclofenac Sodium (VOLTAREN) 1 % GEL APPLY TO AFFECTED AREAOEVERY SIX TO EIGHT HOURS AS NEEDED FOR PAIN.    Marland Kitchen doxycycline (VIBRA-TABS) 100 MG tablet Take 1 tablet (100 mg total) by mouth 2 (two) times daily. 14 tablet 0  . doxycycline (VIBRAMYCIN) 100 MG capsule Take 100 mg by mouth 2 (two) times daily.    Marland Kitchen esomeprazole ([place]) 20  MG capsule Take 20 mg by mouth daily at 12 noon.    . fluticasone (FLONASE) 50 MCG/ACT nasal spray Place 1 spray into both nostrils 2 (two) times daily as needed for allergies or rhinitis. 16 g 6  . gabapentin (NEURONTIN) 400 MG capsule Take 400 mg by mouth 4 (four) times daily.    Marland Kitchen gabapentin (NEURONTIN) 800 MG tablet Take 800 mg by mouth 3 (three) times daily.    Marland Kitchen HYDROcodone-acetaminophen (NORCO/VICODIN) 5-325 MG tablet Take 1 tablet by mouth 2 (two) times daily as needed.    Marland Kitchen  ipratropium-albuterol (DUONEB) 0.5-2.5 (3) MG/3ML SOLN Take 3 mLs by nebulization every 4 (four) hours as needed. 360 mL 5  . lidocaine (XYLOCAINE) 5 % ointment APPLY TO SKIN THREE TIMESEDAILY AS NEEDED FOR PAIN.    Marland Kitchen lisinopril (PRINIVIL,ZESTRIL) 10 MG tablet Take 10 mg by mouth daily.    Marland Kitchen lisinopril (ZESTRIL) 30 MG tablet Take 30 mg by mouth daily.    Marland Kitchen lovastatin (MEVACOR) 40 MG tablet Take 40 mg by mouth at bedtime.    . metFORMIN (GLUCOPHAGE) 1000 MG tablet Take 1,000 mg by mouth 2 (two) times daily.    . nabumetone (RELAFEN) 750 MG tablet Take 750 mg by mouth daily.    . nitroGLYCERIN (NITROSTAT) 0.4 MG SL tablet DISSOLVE 1 TABLET UNDER THE TONGUE EVERY 5 MINUTES IF NEEDED FOR CHEST PAIN. MAX 3 DOSES THEN CALL 911. (Patient taking differently: Place 0.4 mg under the tongue every 5 (five) minutes as needed for chest pain. ) 25 tablet 3  . OXYGEN Inhale 4 L into the lungs continuous.    . predniSONE (DELTASONE) 10 MG tablet Take 4 tabs po daily x 3 days; then 3 tabs daily x3 days; then 2 tabs daily x3 days; then 1 tab daily x 3 days; then stop 30 tablet 0  . rOPINIRole (REQUIP) 1 MG tablet Take 1 mg by mouth 3 (three) times daily.     Marland Kitchen torsemide (DEMADEX) 20 MG tablet Take 1 tablet (20 mg total) by mouth daily as needed. May take an additional 20 mg daily as needed for extreme swelling. 180 tablet 3  . traZODone (DESYREL) 100 MG tablet Take 100 mg by mouth daily.    . metFORMIN (GLUCOPHAGE) 500 MG tablet Take 1 tablet (500 mg total) by mouth 2 (two) times daily with a meal. 60 tablet 0   Current Facility-Administered Medications on File Prior to Visit  Medication Dose Route Frequency Provider Last Rate Last Admin  . triamcinolone acetonide (KENALOG) 10 MG/ML injection 10 mg  10 mg Other Once Landis Martins, DPM        Allergies  Allergen Reactions  . Penicillins Other (See Comments)    Patient is unsure if she allergic to penicillin or septra. Patient states one or another caused "rib  pain with a little breathing problem". Has patient had a PCN reaction causing immediate rash, facial/tongue/throat swelling, SOB or lightheadedness with hypotension: YES Has patient had a PCN reaction causing severe rash involving mucus membranes or skin necrosis: NO Has patient had a PCN reaction that required hospitalization: NO Has patient had a PCN reaction occurring within the last 10 years: NO  . Septra [Sulfamethoxazole-Trimethoprim] Other (See Comments)    Patient is unsure if she allergic to septra or penicillin. Patient states one or another caused "rib pain with a little breathing problem".    Social History   Socioeconomic History  . Marital status: Married    Spouse name: Not  on file  . Number of children: Not on file  . Years of education: Not on file  . Highest education level: Not on file  Occupational History  . Occupation: disability  Tobacco Use  . Smoking status: Current Every Day Smoker    Packs/day: 1.50    Years: 51.00    Pack years: 76.50    Types: Cigarettes    Start date: 7  . Smokeless tobacco: Never Used  . Tobacco comment: 10/22/19- 12 cigs in a 24 hour period  Substance and Sexual Activity  . Alcohol use: Yes    Alcohol/week: 1.0 standard drinks    Types: 1 Standard drinks or equivalent per week    Comment: quit 11/03/2014. used to drink couple of 40 ounces.  . Drug use: No  . Sexual activity: Yes    Birth control/protection: Post-menopausal  Other Topics Concern  . Not on file  Social History Narrative  . Not on file   Social Determinants of Health   Financial Resource Strain:   . Difficulty of Paying Living Expenses:   Food Insecurity:   . Worried About Charity fundraiser in the Last Year:   . Arboriculturist in the Last Year:   Transportation Needs:   . Film/video editor (Medical):   Marland Kitchen Lack of Transportation (Non-Medical):   Physical Activity:   . Days of Exercise per Week:   . Minutes of Exercise per Session:   Stress:   .  Feeling of Stress :   Social Connections:   . Frequency of Communication with Friends and Family:   . Frequency of Social Gatherings with Friends and Family:   . Attends Religious Services:   . Active Member of Clubs or Organizations:   . Attends Archivist Meetings:   Marland Kitchen Marital Status:     Family History  Adopted: Yes    Family History  Adopted: Yes     Objective:  General: Alert and oriented x3 in no acute distress on 02  Patient is assisted by husband this visit  Dermatology: There is a raised fluctuant soft tissue mass at the dorsal first metatarsal phalangeal joint that measures less than 0.5 cm translucent consistent with cyst over the right joint like previous.  There is no warmth no erythema to this area.  Clinical pictures in chart.  Vascular: Dorsalis Pedis and Posterior Tibial pedal pulses faintly palpable, Capillary Fill Time 5 seconds,scant pedal hair growth bilateral, Temperature gradient within normal limits.  Neurology: Johney Maine sensation intact via light touch bilateral.  Musculoskeletal: Mild tenderness with palpation at cyst over the right big toe joint with significant limited range of motion and dorsal bone spurs consistent with hallux rigidus at the right greater than left big toe joint like previous.  Assessment and Plan: Problem List Items Addressed This Visit    None    Visit Diagnoses    Ganglion    -  Primary   Arthritis       Relevant Medications   celecoxib (CELEBREX) 100 MG capsule   HYDROcodone-acetaminophen (NORCO/VICODIN) 5-325 MG tablet   nabumetone (RELAFEN) 750 MG tablet   Hallux rigidus of right foot       Right foot pain           -Complete examination performed -Previous xrays reviewed -Discussed treatement options cyst and arthritis at right 1st toe joint; advised patient that another aspiration of this joint will likely cause reaccumulation and at this time since she is already  had 2 previous aspirations I think it is  best for [place] to plan surgical excision of cyst and removal of bone spurs as well as possible implant depending on the extent of arthritis at the big toe joint -Patient opt for surgical management. Consent obtained for removal of bone spurs and cyst at right big toe joint with possible implant.  Pre and Post op course explained. Risks, benefits, alternatives explained. No guarantees given or implied. Surgical booking slip submitted and provided patient with Surgical packet and info for [facility]. -Patient to see her PCP for history and physical/medical clearance -To dispense postoperative shoe at the time of surgery - Advised patient to rest ice elevate and may take Tylenol as needed for any pain to the area. -Applied Coban compression to the right first toe and advised patient that she can continue with this until the time for surgery to protect the toe from rubbing when in a shoe at the area of cyst/bone spur -Patient to return to office after surgery or sooner if condition worsens.  Landis Martins, DPM

## 2019-12-04 NOTE — Pre-Procedure Instructions (Signed)
[name]  12/04/2019      Your procedure is scheduled on Monday, December 10, 2019  Report to [facility] at 6:15 A.M.  Call this number if you have problems the morning of surgery:  [phone]   Remember: Brush your teeth the morning of surgery.    Do not eat or drink after midnight the night before surgery.    Take these medicines the morning of surgery with A SIP OF WATER :   cetirizine (ZYRTEC)     esomeprazole (NEXIUM)   rOPINIRole (REQUIP)  cycloSPORINE (RESTASIS) eye drops,   albuterol-ipratropium (COMBIVENT)  Inhaler  budesonide-formoterol (SYMBICORT)  Inhaler   ( Bring inhalers in with you) If needed: gabapentin (NEURONTIN) for pain If needed: fluticasone (FLONASE)  nasal spray for allergies If needed: nebulizer breathing treatment for wheezing or shortness of breath If needed: nitroGLYCERIN (NITROSTAT) for chest pain  Stop taking Aspirin (unless otherwise advised by surgeon), vitamins, fish oil and herbal medications. Do not take any NSAIDs ie: nabumetone (RELAFEN), diclofenac Sodium (VOLTAREN) 1 % GEL,  Ibuprofen, Advil, Naproxen (Aleve), Motrin, BC and Goody Powder; stop now.  Do not smoke tobacco, pipes, cigars, use snuff /chew, vaping products or drink alcohol 24 hours prior to surgery.    How to Manage Your Diabetes Before and After Surgery  Why is it important to control my blood sugar before and after surgery? . Improving blood sugar levels before and after surgery helps healing and can limit problems. . A way of improving blood sugar control is eating a healthy diet by: o  Eating less sugar and carbohydrates o  Increasing activity/exercise o  Talking with your doctor about reaching your blood sugar goals . High blood sugars (greater than 180 mg/dL) can raise your risk of infections and slow your recovery, so you will need to focus on controlling your diabetes during the weeks before surgery. . Make sure that the doctor who  takes care of your diabetes knows about your planned surgery including the date and location.  How do I manage my blood sugar before surgery? . Check your blood sugar at least 4 times a day, starting 2 days before surgery, to make sure that the level is not too high or low. o Check your blood sugar the morning of your surgery when you wake up and every 2 hours until you get to the Short Stay unit. . If your blood sugar is less than 70 mg/dL, you will need to treat for low blood sugar: o Treat a low blood sugar (less than 70 mg/dL) with  cup of clear juice (cranberry or apple), 4 glucose tablets, OR glucose gel. Recheck blood sugar in 15 minutes after treatment (to make sure it is greater than 70 mg/dL). If your blood sugar is not greater than 70 mg/dL on recheck, call [phone] o  for further instructions. . Report your blood sugar to the short stay nurse when you get to Short Stay.  . If you are admitted to the hospital after surgery: o Your blood sugar will be checked by the staff and you will probably be given insulin after surgery (instead of oral diabetes medicines) to make sure you have good blood sugar levels. o The goal for blood sugar control after surgery is 80-180 mg/dL.     WHAT DO I DO ABOUT MY DIABETES MEDICATION?  Marland Kitchen Do not take oral diabetes medicines (pills) the morning of surgery such as  metFORMIN (GLUCOPHAGE)  Reviewed and Endorsed by [facility] Patient Education Committee, August 2015  Do not wear jewelry, make-up or nail polish.  Do not wear lotions, powders, or perfumes, or deodorant.  Do not shave 48 hours prior to surgery.   Do not bring valuables to the hospital.  [facility] is not responsible for any belongings or valuables.  Contacts, dentures or bridgework may not be worn into surgery.  Leave your suitcase in the car.  After surgery it may be brought to your room.  For patients admitted to the hospital, discharge time will be determined by your  treatment team.  Patients discharged the day of surgery will not be allowed to drive home.   Special instructions: See " [place] preparing For Surgery " sheet.   Please read over the following fact sheets that you were given. Pain Booklet, Coughing and Deep Breathing and Surgical Site Infection Prevention

## 2019-12-05 ENCOUNTER — Encounter (HOSPITAL_COMMUNITY): Payer: Self-pay

## 2019-12-05 ENCOUNTER — Inpatient Hospital Stay (HOSPITAL_COMMUNITY)
Admission: RE | Admit: 2019-12-05 | Discharge: 2019-12-05 | Disposition: A | Discharge status: Home / Self Care | Payer: Medicaid Other | Source: Ambulatory Visit

## 2019-12-05 ENCOUNTER — Ambulatory Visit (HOSPITAL_COMMUNITY): Payer: Medicaid Other | Admitting: Physical Therapy

## 2019-12-05 NOTE — Progress Notes (Signed)
Nurse lvm with Ladonna Snide, Surgical Coordianator, regarding the need for an H&P within 30 days of surgery; awaiting a return call.

## 2019-12-05 NOTE — Progress Notes (Signed)
Pt did not show for scheduled PAT appointment. Nurse lvm on pt voice mailbox; awaiting a return call.

## 2019-12-07 ENCOUNTER — Other Ambulatory Visit (HOSPITAL_COMMUNITY): Payer: Medicaid Other

## 2019-12-11 ENCOUNTER — Other Ambulatory Visit: Payer: Medicaid Other | Admitting: Adult Health

## 2019-12-12 ENCOUNTER — Telehealth: Payer: Self-pay

## 2019-12-12 NOTE — Telephone Encounter (Signed)
Patient called needing to reschedule her surgery on 12/24/19. She is having issus getting an appointment with her pcp to have the H&P form completed. I told her to work on getting the form completed and once she has it to call me and I will get the surgery rescheduled.

## 2019-12-13 ENCOUNTER — Telehealth: Payer: Self-pay | Admitting: Internal Medicine

## 2019-12-13 NOTE — Telephone Encounter (Signed)
Spoke with patient. She stated that she was calling to get the results from her sleep study back in May. The sleep study was done on 11/08/19.   I found the following recommendations on the sleep study.   "RECOMMENDATIONS - She does not qualify for PAP therapy based on this study. - Could assess for non-invasive positive pressure ventilation on the basis of chronic respiratory failure.  She will need follow up in the pulmonary clinic to assess this further."  Dr. Halford Chessman, it says that you read the sleep study. Are you ok with [place] releasing your recommendations to the patient since Dr. Shearon Stalls is on leave? Please advise. Thanks!

## 2019-12-14 NOTE — Telephone Encounter (Signed)
She needs ROV to discuss results in more detail.

## 2019-12-14 NOTE — Telephone Encounter (Signed)
ATC pt, no answer. Left message for pt to call back.  

## 2019-12-17 NOTE — Telephone Encounter (Signed)
Patient has been scheduled for a visit on 6/21 with Beth to discuss results.

## 2019-12-18 ENCOUNTER — Encounter: Payer: Medicaid Other | Admitting: Sports Medicine

## 2019-12-20 ENCOUNTER — Ambulatory Visit (HOSPITAL_COMMUNITY): Payer: Medicaid Other | Attending: Neurosurgery | Admitting: Physical Therapy

## 2019-12-20 ENCOUNTER — Telehealth: Payer: Self-pay | Admitting: Internal Medicine

## 2019-12-20 NOTE — Telephone Encounter (Signed)
Called and spoke with pt letting her know that at her scheduled visit 6/21 with Sharman Crate will be further discussing the results of the HST with her. Pt verbalized understanding. Nothing further needed.

## 2019-12-24 ENCOUNTER — Encounter (HOSPITAL_COMMUNITY): Admission: RE | Payer: Self-pay | Source: Home / Self Care

## 2019-12-24 ENCOUNTER — Ambulatory Visit (HOSPITAL_COMMUNITY): Admission: RE | Admit: 2019-12-24 | Payer: Medicaid Other | Source: Home / Self Care | Admitting: Sports Medicine

## 2019-12-24 ENCOUNTER — Ambulatory Visit (INDEPENDENT_AMBULATORY_CARE_PROVIDER_SITE_OTHER): Payer: Medicaid Other | Admitting: Primary Care

## 2019-12-24 ENCOUNTER — Other Ambulatory Visit: Payer: Self-pay

## 2019-12-24 DIAGNOSIS — J449 Chronic obstructive pulmonary disease, unspecified: Secondary | ICD-10-CM | POA: Diagnosis not present

## 2019-12-24 DIAGNOSIS — J398 Other specified diseases of upper respiratory tract: Secondary | ICD-10-CM | POA: Diagnosis not present

## 2019-12-24 SURGERY — CHEILECTOMY
Anesthesia: Regional | Laterality: Right

## 2019-12-24 NOTE — Progress Notes (Signed)
Virtual Visit via Telephone Note  I connected with [name] on 12/24/19 at 10:00 AM EDT by telephone and verified that I am speaking with the correct person using two identifiers.  Location: Patient: Home Provider: Office   I discussed the limitations, risks, security and privacy concerns of performing an evaluation and management service by telephone and the availability of in person appointments. I also discussed with the patient that there may be a patient responsible charge related to this service. The patient expressed understanding and agreed to proceed.   History of Present Illness:  64 year old female, current every day smoker. PMH significant for COPD (FEV1 1.46L/ 51%), severe tacheomalacia, respiratory failure (oxygen dependent), respiratory bronchiolitis associated ILD, GERD, DM, metabolic encephalopathy, polysubstance abuse, bipolar 1 disorder, benzodiazepine dependence. [name], seen on 08/29/19 for consult. Maintained on Symbicort 160, as needed combivent/duonebs.    Previous [facility] pulmonary encounter: 10/08/2019 She had a episode today where she reports chocking on her mucus where she was gasping for her breath. She was able to recover after a few minutes and using duoneb breathing treatment. She wears oxygen at night. Uses nebulizer four times a day. Takes mucinex twice a day and nexium daily. She has not been using her flutter valve. She does not have a oximeter to check her oxygen level. She is on 4.5L oxygen.     10/22/2019 Patient contacted today for follow-up. She did not fill azithromycin prescription initially d/t cost. She has one day left and feels it has helped her congestion a lot. She has also been taking mucinex twice a day. She avoids going outside d/t pollen. She takes zytrec daily and is compliant with nexium 20mg . She hasn't started chantix d/t hx anxiety and nightmares. She reports loud snoring at night. She has not tried wellbutrin but is  interested in a medication option to help her quit smoking. She is trying to cut back use. Consider repeating PFTs  11/09/2019 Patient contacted today for acute visit. She called earlier this week with reports of productive cough with associated shortness of breath and wheezing. Send in prescription for doxycyline. No improvement. Nasal congestion has loosed up some today. She is taking her Symbicort two puffs twice a day. Uses albuterol nebulizer 3-4 times a day. She had in-lab sleep study last night, results not available. She has questions about getting covid vaccine.   12/24/2019 Patient contacted today for a follow-up televisit to review sleep study results/ hx tracheomalacia. She is actually doing pretty well. No recent exacerbations. She has a chronic cough, clear mucus yesterday. She continues taking Symbicort 160 two puffs twice daily. She wears 4.5L oxygen daily. She feels she may be getting too much oxygen and sometimes turns it down and this makes her feel better. She has not bought a pulse oximeter, encouraged her to do so to monitor her O2 levels. She continues to smoke.    Observations/Objective:  - Appears well; no overt shortness of breath or wheezing during phone conversation - Unable to check O2 levels over the phone (advised patient get pulse oximeter to monitor levels)  11/08/19 NPSG: The overall apnea/hypopnea index (AHI) was 0.4 per hour. There were 0 total apneas, including 0 obstructive, 0 central and 0 mixed apneas. There were 2 hypopneas and 5 RERAs. The AHI during Stage REM sleep was 2.0 per hour. AHI while supine was N/A per hour. The mean oxygen saturation was 91.5%. The minimum SpO2 during sleep was 75.0%.  She required the  use of 2 liters oxygen during this study.  Assessment and Plan:  Severe tracheomalacia/  COPD: - Maximized treatment of underlying COPD and GERD symptoms and treat any exacerbations conservatively - Continue Symbicort 160 two puffs BID +  Combivent/duoneb prn  - No evidence of obstruction on in-lab sleep study, may consider Trilogy ventilator in the future if needed  - Strongly encourage patient quit smoking  GERD: -Continue Nexium 20 mg daily in the morning - Adding over-the-counter Pepcid 20 mg take at bedtime  Nocturnal hypoxemia: - The mean oxygen saturation was 91.5%. The minimum SpO2 during sleep was 75.0%.  She required the use of 2 liters oxygen during this study.  Follow Up Instructions:  - FU in 3 months with Dr. Shearon Stalls   I discussed the assessment and treatment plan with the patient. The patient was provided an opportunity to ask questions and all were answered. The patient agreed with the plan and demonstrated an understanding of the instructions.   The patient was advised to call back or seek an in-person evaluation if the symptoms worsen or if the condition fails to improve as anticipated.  I provided 18 minutes of non-face-to-face time during this encounter.   Martyn Ehrich, NP

## 2019-12-24 NOTE — Patient Instructions (Addendum)
Your sleep study showed no evidence of obstructive sleep apnea  Recommendations: Continue Symbicort 2 puffs twice daily (rinse mouth afterwards) Continue oxygen 2 to 4 L to keep O2 level greater than 88-92% Allergy to get a pulse oximeter from any of the drug stores or you can check [employer] Continue to encourage that she quit smoking, and tapering down amount and pick a quit date that you feel comfortable with Continue Nexium 20 mg daily in the morning Continue Zyrtec 10 mg daily in the morning Adding over-the-counter Pepcid 20 mg take at bedtime  Follow-up: 2 to 3 months with Dr. Shearon Stalls or sooner if needed

## 2019-12-25 ENCOUNTER — Encounter: Payer: Medicaid Other | Admitting: Sports Medicine

## 2019-12-28 ENCOUNTER — Telehealth: Payer: Self-pay | Admitting: Adult Health

## 2019-12-28 NOTE — Telephone Encounter (Signed)
Called patient regarding appointment scheduled in our office.  Updated visitor policy: We are now allowing one support person with you during your upcoming visit.  However, we do ask that they wear a mask and will also be screened at check-in.   Pre-screen questions asked: 1. Any of the following symptoms of COVID such as chills, fever, cough, shortness of breath, muscle pain, diarrhea, rash, vomiting, abdominal pain, red eye, weakness, bruising, bleeding, joint pain, loss of taste or smell, a severe headache, sore throat, fatigue 2. Any exposure to anyone suspected or confirmed of having COVID-19 3. Awaiting test results for COVID-19  Also,to keep you safe, please use the provided hand sanitizer when you enter the office. We are asking everyone in the office to wear a mask to help prevent the spread of germs. If you have a mask of your own, please wear it to your appointment, if not, we are happy to provide one for you.  Thank you for understanding and your cooperation.    [facility] Staff    

## 2019-12-31 ENCOUNTER — Encounter: Payer: Self-pay | Admitting: Primary Care

## 2019-12-31 ENCOUNTER — Other Ambulatory Visit: Payer: Medicaid Other | Admitting: Adult Health

## 2020-01-01 ENCOUNTER — Encounter: Payer: Medicaid Other | Admitting: Sports Medicine

## 2020-01-01 NOTE — H&P (Signed)
Surgical History & Physical  Patient Name: [name] DOB: Nov 26, 1955  Surgery: Cataract extraction with intraocular lens implant phacoemulsification; Left Eye  Surgeon: Baruch Goldmann MD Surgery Date:  01/14/2020 Pre-Op Date:  12/27/2019  HPI: A 28 Yr. old female patient is referred by Dr Jorja Loa for cataract eval. 1. 1. The patient complains of difficulty when viewing TV, reading closed caption, news scrolls on TV, which began 12 months ago. Patient feels details further away just aren't as clear. This is negatively affecting the patient's quality of life. Both eyes are affected, OS>OD. The episode is gradual. The condition's severity increased since last visit. Symptoms occur when the patient is driving, inside and outside. The complaint is associated with glare. Patient feels her vision just all blurs together. HPI was performed by Baruch Goldmann .  Medical History: Cataracts Diabetes GERD, Bipolar ,COPD, Restless leg syndrome, Lung Problems  Review of Systems Respiratory COPD All recorded systems are negative except as noted above.  Social   Current every day smoker of Cigarettes 1 pack per day   Medication Breathing Treatments, Combivent, Lasix, Lisinopril, Metformin, Nexium,   Sx/Procedures Tubal Ligation, Wrist surgery,   Drug Allergies  Pencillin, Septra,   History & Physical: Heent:  Cataract, Left eye NECK: supple without bruits LUNGS: lungs clear to auscultation CV: regular rate and rhythm Abdomen: soft and non-tender  Impression & Plan: Assessment: 1.  COMBINED FORMS AGE RELATED CATARACT; Both Eyes (H25.813) 2.  BLEPHARITIS; Right Lower Lid, Left Upper Lid, Left Lower Lid, Right Upper Lid (H01.001, H01.002,H01.004,H01.005) 3.  DERMATOCHALASIS, no surgery; Right Upper Lid, Left Upper Lid (H02.831, S82.707)  Plan: 1.  Cataract accounts for the patient's decreased vision. This visual impairment is not correctable with a tolerable change in glasses or  contact lenses. Cataract surgery with an implantation of a new lens should significantly improve the visual and functional status of the patient. Discussed all risks, benefits, alternatives, and potential complications. Discussed the procedures and recovery. Patient desires to have surgery. A-scan ordered and performed today for intra-ocular lens calculations. The surgery will be performed in order to improve vision for driving, reading, and for eye examinations. Recommend phacoemulsification with intra-ocular lens. Left Eye worse - first. Dilates poorly - shugacaine by protocol. Malyugin Ring. Omidira. 2.  regular lid cleaning. 3.  Asymptomatic, recommend observation for now. Findings, prognosis and treatment options reviewed.

## 2020-01-08 ENCOUNTER — Encounter: Payer: Medicaid Other | Admitting: Podiatry

## 2020-01-09 ENCOUNTER — Ambulatory Visit (HOSPITAL_COMMUNITY): Payer: Medicaid Other | Admitting: Physical Therapy

## 2020-01-10 NOTE — Patient Instructions (Signed)
Your procedure is scheduled on: 01/14/2020               Report to [facility] at   9:00  AM.                Call this number if you have problems the morning of surgery: [phone]   Do not eat or drink :After Midnight.   No Smoking am of procedure    Take these medicines the morning of surgery with A SIP OF WATER:      O2, Zyrtec, and Requip   Use inhalers if needed     Do not wear jewelry, make-up or nail polish.  Do not wear lotions, powders, or perfumes. You may wear deodorant.  Do not bring valuables to the hospital.  Contacts, dentures or bridgework may not be worn into surgery.  Patients discharged the day of surgery will not be allowed to drive home.  Name and phone number of your driver.                                                                                                                                       Cataract Surgery  A cataract is a clouding of the lens of the eye. When a lens becomes cloudy, vision is reduced based on the degree and nature of the clouding. Surgery may be needed to improve vision. Surgery removes the cloudy lens and usually replaces it with a substitute lens (intraocular lens, IOL). LET YOUR EYE DOCTOR KNOW ABOUT:  Allergies to food or medicine.   Medicines taken including herbs, eyedrops, over-the-counter medicines, and creams.   Use of steroids (by mouth or creams).   Previous problems with anesthetics or numbing medicine.   History of bleeding problems or blood clots.   Previous surgery.   Other health problems, including diabetes and kidney problems.   Possibility of pregnancy, if this applies.  RISKS AND COMPLICATIONS  Infection.   Inflammation of the eyeball (endophthalmitis) that can spread to both eyes (sympathetic ophthalmia).   Poor wound healing.   If an IOL is inserted, it can later fall out of proper position. This is very uncommon.   Clouding of the part of your eye that holds an IOL in place. This is called an  "after-cataract." These are uncommon, but easily treated.  BEFORE THE PROCEDURE  Do not eat or drink anything except small amounts of water for 8 to 12 before your surgery, or as directed by your caregiver.    Unless you are told otherwise, continue any eyedrops you have been prescribed.   Talk to your primary caregiver about all other medicines that you take (both prescription and non-prescription). In some cases, you may need to stop or change medicines near the time of your surgery. This is most important if you are taking blood-thinning medicine. Do not stop medicines unless you are told to do so.  Arrange for someone to drive you to and from the procedure.   Do not put contact lenses in either eye on the day of your surgery.  PROCEDURE There is more than one method for safely removing a cataract. Your doctor can explain the differences and help determine which is best for you. Phacoemulsification surgery is the most common form of cataract surgery.  An injection is given behind the eye or eyedrops are given to make this a painless procedure.   A small cut (incision) is made on the edge of the clear, dome-shaped surface that covers the front of the eye (cornea).   A tiny probe is painlessly inserted into the eye. This device gives off ultrasound waves that soften and break up the cloudy center of the lens. This makes it easier for the cloudy lens to be removed by suction.   An IOL may be implanted.   The normal lens of the eye is covered by a clear capsule. Part of that capsule is intentionally left in the eye to support the IOL.   Your surgeon may or may not use stitches to close the incision.  There are other forms of cataract surgery that require a larger incision and stiches to close the eye. This approach is taken in cases where the doctor feels that the cataract cannot be easily removed using phacoemulsification. AFTER THE PROCEDURE  When an IOL is implanted, it does not need  care. It becomes a permanent part of your eye and cannot be seen or felt.   Your doctor will schedule follow-up exams to check on your progress.   Review your other medicines with your doctor to see which can be resumed after surgery.   Use eyedrops or take medicine as prescribed by your doctor.  Document Released: 06/10/2011 Document Reviewed: 06/07/2011 [facility] Patient Information 2012 [place].  .Cataract Surgery Care After Refer to this sheet in the next few weeks. These instructions provide you with information on caring for yourself after your procedure. Your caregiver may also give you more specific instructions. Your treatment has been planned according to current medical practices, but problems sometimes occur. Call your caregiver if you have any problems or questions after your procedure.  HOME CARE INSTRUCTIONS   Avoid strenuous activities as directed by your caregiver.   Ask your caregiver when you can resume driving.   Use eyedrops or other medicines to help healing and control pressure inside your eye as directed by your caregiver.   Only take over-the-counter or prescription medicines for pain, discomfort, or fever as directed by your caregiver.   Do not to touch or rub your eyes.   You may be instructed to use a protective shield during the first few days and nights after surgery. If not, wear sunglasses to protect your eyes. This is to protect the eye from pressure or from being accidentally bumped.   Keep the area around your eye clean and dry. Avoid swimming or allowing water to hit you directly in the face while showering. Keep soap and shampoo out of your eyes.   Do not bend or lift heavy objects. Bending increases pressure in the eye. You can walk, climb stairs, and do light household chores.   Do not put a contact lens into the eye that had surgery until your caregiver says it is okay to do so.   Ask your doctor when you can return to work. This will  depend on the kind of work that you do.  If you work in a dusty environment, you may be advised to wear protective eyewear for a period of time.   Ask your caregiver when it will be safe to engage in sexual activity.   Continue with your regular eye exams as directed by your caregiver.  What to expect:  It is normal to feel itching and mild discomfort for a few days after cataract surgery. Some fluid discharge is also common, and your eye may be sensitive to light and touch.   After 1 to 2 days, even moderate discomfort should disappear. In most cases, healing will take about 6 weeks.   If you received an intraocular lens (IOL), you may notice that colors are very bright or have a blue tinge. Also, if you have been in bright sunlight, everything may appear reddish for a few hours. If you see these color tinges, it is because your lens is clear and no longer cloudy. Within a few months after receiving an IOL, these extra colors should go away. When you have healed, you will probably need new glasses.  SEEK MEDICAL CARE IF:   You have increased bruising around your eye.   You have discomfort not helped by medicine.  SEEK IMMEDIATE MEDICAL CARE IF:   You have a  fever.   You have a worsening or sudden vision loss.   You have redness, swelling, or increasing pain in the eye.   You have a thick discharge from the eye that had surgery.  MAKE SURE YOU:  Understand these instructions.   Will watch your condition.   Will get help right away if you are not doing well or get worse.  Document Released: 01/08/2005 Document Revised: 06/10/2011 Document Reviewed: 02/12/2011 [facility] Patient Information 2012 [place].    Monitored Anesthesia Care  Monitored anesthesia care is an anesthesia service for a medical procedure. Anesthesia is the loss of the ability to feel pain. It is produced by medications called anesthetics. It may affect a small area of your body (local anesthesia), a large  area of your body (regional anesthesia), or your entire body (general anesthesia). The need for monitored anesthesia care depends your procedure, your condition, and the potential need for regional or general anesthesia. It is often provided during procedures where:   General anesthesia may be needed if there are complications. This is because you need special care when you are under general anesthesia.    You will be under local or regional anesthesia. This is so that you are able to have higher levels of anesthesia if needed.    You will receive calming medications (sedatives). This is especially the case if sedatives are given to put you in a semi-conscious state of relaxation (deep sedation). This is because the amount of sedative needed to produce this state can be hard to predict. Too much of a sedative can produce general anesthesia. Monitored anesthesia care is performed by one or more caregivers who have special training in all types of anesthesia. You will need to meet with these caregivers before your procedure. During this meeting, they will ask you about your medical history. They will also give you instructions to follow. (For example, you will need to stop eating and drinking before your procedure. You may also need to stop or change medications you are taking.) During your procedure, your caregivers will stay with you. They will:   Watch your condition. This includes watching you blood pressure, breathing, and level of pain.    Diagnose  and treat problems that occur.    Give medications if they are needed. These may include calming medications (sedatives) and anesthetics.    Make sure you are comfortable.   Having monitored anesthesia care does not necessarily mean that you will be under anesthesia. It does mean that your caregivers will be able to manage anesthesia if you need it or if it occurs. It also means that you will be able to have a different type of anesthesia than you are  having if you need it. When your procedure is complete, your caregivers will continue to watch your condition. They will make sure any medications wear off before you are allowed to go home.  Document Released: 03/17/2005 Document Revised: 10/16/2012 Document Reviewed: 08/02/2012 [facility] Patient Information 2014 [place], [place].

## 2020-01-11 ENCOUNTER — Encounter (HOSPITAL_COMMUNITY): Payer: Self-pay

## 2020-01-11 ENCOUNTER — Encounter (HOSPITAL_COMMUNITY)
Admission: RE | Admit: 2020-01-11 | Discharge: 2020-01-11 | Disposition: A | Discharge status: Home / Self Care | Payer: Medicaid Other | Source: Ambulatory Visit | Attending: Ophthalmology | Admitting: Ophthalmology

## 2020-01-11 ENCOUNTER — Other Ambulatory Visit (HOSPITAL_COMMUNITY)
Admission: RE | Admit: 2020-01-11 | Discharge: 2020-01-11 | Disposition: A | Discharge status: Home / Self Care | Payer: Medicaid Other | Source: Ambulatory Visit | Attending: Ophthalmology | Admitting: Ophthalmology

## 2020-01-11 ENCOUNTER — Other Ambulatory Visit: Payer: Self-pay

## 2020-01-11 ENCOUNTER — Other Ambulatory Visit (HOSPITAL_COMMUNITY): Payer: Self-pay | Admitting: *Deleted

## 2020-01-11 DIAGNOSIS — Z20822 Contact with and (suspected) exposure to covid-19: Secondary | ICD-10-CM | POA: Insufficient documentation

## 2020-01-11 DIAGNOSIS — J449 Chronic obstructive pulmonary disease, unspecified: Secondary | ICD-10-CM | POA: Diagnosis not present

## 2020-01-11 DIAGNOSIS — Z01818 Encounter for other preprocedural examination: Secondary | ICD-10-CM | POA: Insufficient documentation

## 2020-01-11 LAB — BASIC METABOLIC PANEL
Anion gap: 10 (ref 5–15)
BUN: 8 mg/dL (ref 8–23)
CO2: 27 mmol/L (ref 22–32)
Calcium: 10.6 mg/dL — ABNORMAL HIGH (ref 8.9–10.3)
Chloride: 99 mmol/L (ref 98–111)
Creatinine, Ser: 0.51 mg/dL (ref 0.44–1.00)
GFR calc Af Amer: >60 mL/min (ref >60)
GFR calc non Af Amer: >60 mL/min (ref >60)
Glucose, Bld: 143 mg/dL — ABNORMAL HIGH (ref 70–99)
Potassium: 4.2 mmol/L (ref 3.5–5.1)
Sodium: 136 mmol/L (ref 135–145)

## 2020-01-11 LAB — HEMOGLOBIN A1C
Hgb A1c MFr Bld: 6.4 % — ABNORMAL HIGH (ref 4.8–5.6)
Mean Plasma Glucose: 136.98 mg/dL

## 2020-01-11 LAB — SARS CORONAVIRUS 2 (TAT 6-24 HRS): SARS Coronavirus 2: NEGATIVE

## 2020-01-14 ENCOUNTER — Other Ambulatory Visit: Payer: Self-pay

## 2020-01-14 ENCOUNTER — Ambulatory Visit (HOSPITAL_COMMUNITY): Payer: Medicaid Other | Admitting: Anesthesiology

## 2020-01-14 ENCOUNTER — Encounter (HOSPITAL_COMMUNITY)
Admission: RE | Disposition: A | Discharge status: Home / Self Care | Payer: Self-pay | Source: Home / Self Care | Attending: Ophthalmology

## 2020-01-14 ENCOUNTER — Encounter (HOSPITAL_COMMUNITY): Payer: Self-pay | Admitting: Ophthalmology

## 2020-01-14 ENCOUNTER — Telehealth: Payer: Self-pay | Admitting: Internal Medicine

## 2020-01-14 ENCOUNTER — Ambulatory Visit (HOSPITAL_COMMUNITY)
Admission: RE | Admit: 2020-01-14 | Discharge: 2020-01-14 | Disposition: A | Discharge status: Home / Self Care | Payer: Medicaid Other | Attending: Ophthalmology | Admitting: Ophthalmology

## 2020-01-14 DIAGNOSIS — K219 Gastro-esophageal reflux disease without esophagitis: Secondary | ICD-10-CM | POA: Insufficient documentation

## 2020-01-14 DIAGNOSIS — Z79899 Other long term (current) drug therapy: Secondary | ICD-10-CM | POA: Insufficient documentation

## 2020-01-14 DIAGNOSIS — Z882 Allergy status to sulfonamides status: Secondary | ICD-10-CM | POA: Diagnosis not present

## 2020-01-14 DIAGNOSIS — H0100A Unspecified blepharitis right eye, upper and lower eyelids: Secondary | ICD-10-CM | POA: Insufficient documentation

## 2020-01-14 DIAGNOSIS — E1136 Type 2 diabetes mellitus with diabetic cataract: Secondary | ICD-10-CM | POA: Diagnosis not present

## 2020-01-14 DIAGNOSIS — F319 Bipolar disorder, unspecified: Secondary | ICD-10-CM | POA: Insufficient documentation

## 2020-01-14 DIAGNOSIS — Z880 Allergy status to penicillin: Secondary | ICD-10-CM | POA: Diagnosis not present

## 2020-01-14 DIAGNOSIS — I10 Essential (primary) hypertension: Secondary | ICD-10-CM | POA: Insufficient documentation

## 2020-01-14 DIAGNOSIS — Z7984 Long term (current) use of oral hypoglycemic drugs: Secondary | ICD-10-CM | POA: Diagnosis not present

## 2020-01-14 DIAGNOSIS — J398 Other specified diseases of upper respiratory tract: Secondary | ICD-10-CM

## 2020-01-14 DIAGNOSIS — H25812 Combined forms of age-related cataract, left eye: Secondary | ICD-10-CM | POA: Diagnosis not present

## 2020-01-14 DIAGNOSIS — F17210 Nicotine dependence, cigarettes, uncomplicated: Secondary | ICD-10-CM | POA: Insufficient documentation

## 2020-01-14 DIAGNOSIS — H0100B Unspecified blepharitis left eye, upper and lower eyelids: Secondary | ICD-10-CM | POA: Insufficient documentation

## 2020-01-14 DIAGNOSIS — J449 Chronic obstructive pulmonary disease, unspecified: Secondary | ICD-10-CM | POA: Insufficient documentation

## 2020-01-14 HISTORY — PX: CATARACT EXTRACTION W/PHACO: SHX586

## 2020-01-14 HISTORY — PX: EYE SURGERY: SHX253

## 2020-01-14 LAB — GLUCOSE, CAPILLARY: Glucose-Capillary: 169 mg/dL — ABNORMAL HIGH (ref 70–99)

## 2020-01-14 SURGERY — PHACOEMULSIFICATION, CATARACT, WITH IOL INSERTION
Anesthesia: Monitor Anesthesia Care | Site: Eye | Laterality: Left

## 2020-01-14 MED ORDER — MIDAZOLAM HCL 2 MG/2ML IJ SOLN
INTRAMUSCULAR | Status: AC
  Filled 2020-01-14: qty 2

## 2020-01-14 MED ORDER — PHENYLEPHRINE HCL 2.5 % OP SOLN
1.0000 [drp] | OPHTHALMIC | Status: AC | PRN
  Administered 2020-01-14 (×3): 1 [drp] via OPHTHALMIC

## 2020-01-14 MED ORDER — SODIUM HYALURONATE 23 MG/ML IO SOLN
INTRAOCULAR | Status: DC | PRN
  Administered 2020-01-14: 0.6 mL via INTRAOCULAR

## 2020-01-14 MED ORDER — LIDOCAINE HCL (PF) 1 % IJ SOLN
INTRAOCULAR | Status: DC | PRN
  Administered 2020-01-14: 1 mL via OPHTHALMIC

## 2020-01-14 MED ORDER — EPINEPHRINE PF 1 MG/ML IJ SOLN
INTRAMUSCULAR | Status: AC
  Filled 2020-01-14: qty 1

## 2020-01-14 MED ORDER — MIDAZOLAM HCL 2 MG/2ML IJ SOLN
INTRAMUSCULAR | Status: DC | PRN
  Administered 2020-01-14 (×2): 2 mg via INTRAVENOUS

## 2020-01-14 MED ORDER — POVIDONE-IODINE 5 % OP SOLN
OPHTHALMIC | Status: DC | PRN
  Administered 2020-01-14: 1 via OPHTHALMIC

## 2020-01-14 MED ORDER — LIDOCAINE HCL 3.5 % OP GEL
1.0000 "application " | Freq: Once | OPHTHALMIC | Status: AC
  Administered 2020-01-14: 1 via OPHTHALMIC

## 2020-01-14 MED ORDER — AZITHROMYCIN 250 MG PO TABS: ORAL_TABLET | ORAL | 0 refills | Status: DC

## 2020-01-14 MED ORDER — BSS IO SOLN
INTRAOCULAR | Status: DC | PRN
  Administered 2020-01-14: 15 mL via INTRAOCULAR

## 2020-01-14 MED ORDER — CYCLOPENTOLATE-PHENYLEPHRINE 0.2-1 % OP SOLN
1.0000 [drp] | OPHTHALMIC | Status: AC | PRN
  Administered 2020-01-14 (×3): 1 [drp] via OPHTHALMIC

## 2020-01-14 MED ORDER — TETRACAINE HCL 0.5 % OP SOLN
1.0000 [drp] | OPHTHALMIC | Status: AC | PRN
  Administered 2020-01-14 (×3): 1 [drp] via OPHTHALMIC

## 2020-01-14 MED ORDER — PHENYLEPHRINE-KETOROLAC 1-0.3 % IO SOLN
INTRAOCULAR | Status: AC
  Filled 2020-01-14: qty 4

## 2020-01-14 MED ORDER — PROVISC 10 MG/ML IO SOLN
INTRAOCULAR | Status: DC | PRN
  Administered 2020-01-14: 0.85 mL via INTRAOCULAR

## 2020-01-14 MED ORDER — PHENYLEPHRINE-KETOROLAC 1-0.3 % IO SOLN
INTRAOCULAR | Status: DC | PRN
  Administered 2020-01-14: 500 mL via OPHTHALMIC

## 2020-01-14 SURGICAL SUPPLY — 13 items
CLOTH BEACON ORANGE TIMEOUT ST (SAFETY) ×1 IMPLANT
EYE SHIELD UNIVERSAL CLEAR (GAUZE/BANDAGES/DRESSINGS) ×1 IMPLANT
GLOVE BIOGEL PI IND STRL 7.0 (GLOVE) IMPLANT
GLOVE BIOGEL PI INDICATOR 7.0 (GLOVE) ×2
LENS ALC ACRYL/TECN (Ophthalmic Related) ×1 IMPLANT
NDL HYPO 18GX1.5 BLUNT FILL (NEEDLE) IMPLANT
NEEDLE HYPO 18GX1.5 BLUNT FILL (NEEDLE) ×2 IMPLANT
PAD ARMBOARD 7.5X6 YLW CONV (MISCELLANEOUS) ×1 IMPLANT
SYR TB 1ML LL NO SAFETY (SYRINGE) ×1 IMPLANT
TAPE SURG TRANSPORE 1 IN (GAUZE/BANDAGES/DRESSINGS) IMPLANT
TAPE SURGICAL TRANSPORE 1 IN (GAUZE/BANDAGES/DRESSINGS) ×2
VISCOELASTIC ADDITIONAL (OPHTHALMIC RELATED) ×1 IMPLANT
WATER STERILE IRR 250ML POUR (IV SOLUTION) ×1 IMPLANT

## 2020-01-14 NOTE — Anesthesia Preprocedure Evaluation (Signed)
Anesthesia Evaluation  Patient identified by MRN, date of birth, ID band Patient awake    Reviewed: Allergy & Precautions, H&P , NPO status , Patient's Chart, lab work & pertinent test results, reviewed documented beta blocker date and time   Airway Mallampati: II  TM Distance: >3 FB Neck ROM: full    Dental no notable dental hx. (+) Teeth Intact   Pulmonary pneumonia, resolved, COPD,  COPD inhaler, Current Smoker and Patient abstained from smoking.,    Pulmonary exam normal breath sounds clear to auscultation       Cardiovascular Exercise Tolerance: Good hypertension, negative cardio ROS   Rhythm:regular Rate:Normal     Neuro/Psych PSYCHIATRIC DISORDERS Anxiety Depression Bipolar Disorder negative neurological ROS     GI/Hepatic Neg liver ROS, GERD  Medicated,  Endo/Other  negative endocrine ROSdiabetes  Renal/GU negative Renal ROS  negative genitourinary   Musculoskeletal   Abdominal   Peds  Hematology negative hematology ROS (+)   Anesthesia Other Findings   Reproductive/Obstetrics negative OB ROS                             Anesthesia Physical Anesthesia Plan  ASA: II  Anesthesia Plan: MAC   Post-op Pain Management:    Induction:   PONV Risk Score and Plan: 3  Airway Management Planned:   Additional Equipment:   Intra-op Plan:   Post-operative Plan:   Informed Consent: I have reviewed the patients History and Physical, chart, labs and discussed the procedure including the risks, benefits and alternatives for the proposed anesthesia with the patient or authorized representative who has indicated his/her understanding and acceptance.     Dental Advisory Given  Plan Discussed with: CRNA  Anesthesia Plan Comments:         Anesthesia Quick Evaluation

## 2020-01-14 NOTE — Telephone Encounter (Signed)
I will send in [place]. Recommend she continue mucinex 600mg  1-2 times twice daily for congestion. She should use flutter valve if she has one and encourage deep breathing exercises. She needs follow-up with DR. Shearon Stalls when she is back from maternity leave for tracheomalacia. She may need cyclic antibiotics if she continues to have exacerbation.   Yes we can place an order continuous oxygen 3-4 L to keep O2 88-92%

## 2020-01-14 NOTE — Op Note (Signed)
Date of procedure: 01/14/20  Pre-operative diagnosis: Visually significant age-related combined cataract, Left Eye (H25.812)  Post-operative diagnosis: Visually significant age-related combined cataract, Left Eye (H25.812)  Procedure: Removal of cataract via phacoemulsification and insertion of intra-ocular lens Johnson and Linn  +24.0D into the capsular bag of the Left Eye  Attending surgeon: Gerda Diss. Destanie Tibbetts, MD, MA  Anesthesia: MAC, Topical [place]  Complications: None  Estimated Blood Loss: <27m (minimal)  Specimens: None  Implants: As above  Indications:  Visually significant age-related cataract, Left Eye  Procedure:  The patient was seen and identified in the pre-operative area. The operative eye was identified and dilated.  The operative eye was marked.  Topical anesthesia was administered to the operative eye.     The patient was then to the operative suite and placed in the supine position.  A timeout was performed confirming the patient, procedure to be performed, and all other relevant information.   The patient's face was prepped and draped in the usual fashion for intra-ocular surgery.  A lid speculum was placed into the operative eye and the surgical microscope moved into place and focused.  An inferotemporal paracentesis was created using a 20 gauge paracentesis blade.  Shugarcaine was injected into the anterior chamber.  Viscoelastic was injected into the anterior chamber.  A temporal clear-corneal main wound incision was created using a 2.42mmicrokeratome.  A continuous curvilinear capsulorrhexis was initiated using an irrigating cystitome and completed using capsulorrhexis forceps.  Hydrodissection and hydrodeliniation were performed.  Viscoelastic was injected into the anterior chamber.  A phacoemulsification handpiece and a chopper as a second instrument were used to remove the nucleus and epinucleus. The irrigation/aspiration handpiece was used to remove  any remaining cortical material.   The capsular bag was reinflated with viscoelastic, checked, and found to be intact.  The intraocular lens was inserted into the capsular bag.  The irrigation/aspiration handpiece was used to remove any remaining viscoelastic.  The clear corneal wound and paracentesis wounds were then hydrated and checked with Weck-Cels to be watertight.  The lid-speculum was removed.  The drape was removed.  The patient's face was cleaned with a wet and dry 4x4.  A clear shield was taped over the eye. The patient was taken to the post-operative care unit in good condition, having tolerated the procedure well.  Post-Op Instructions: [facility] and will receive all other orders and instructions.

## 2020-01-14 NOTE — Discharge Instructions (Signed)
Please discharge patient when stable, will follow up today with Dr. Jany Buckwalter at the [facility] office immediately following discharge.  Leave shield in place until visit.  All paperwork with discharge instructions will be given at the office.  [facility] Address:  [address]  [place], [place] [address]             Monitored Anesthesia Care, Care After These instructions provide you with information about caring for yourself after your procedure. Your health care provider may also give you more specific instructions. Your treatment has been planned according to current medical practices, but problems sometimes occur. Call your health care provider if you have any problems or questions after your procedure. What can I expect after the procedure? After your procedure, you may:  Feel sleepy for several hours.  Feel clumsy and have poor balance for several hours.  Feel forgetful about what happened after the procedure.  Have poor judgment for several hours.  Feel nauseous or vomit.  Have a sore throat if you had a breathing tube during the procedure. Follow these instructions at home: For at least 24 hours after the procedure:      Have a responsible adult stay with you. It is important to have someone help care for you until you are awake and alert.  Rest as needed.  Do not: ? Participate in activities in which you could fall or become injured. ? Drive. ? Use heavy machinery. ? Drink alcohol. ? Take sleeping pills or medicines that cause drowsiness. ? Make important decisions or sign legal documents. ? Take care of children on your own. Eating and drinking  Follow the diet that is recommended by your health care provider.  If you vomit, drink water, juice, or soup when you can drink without vomiting.  Make sure you have little or no nausea before eating solid foods. General instructions  Take over-the-counter and  prescription medicines only as told by your health care provider.  If you have sleep apnea, surgery and certain medicines can increase your risk for breathing problems. Follow instructions from your health care provider about wearing your sleep device: ? Anytime you are sleeping, including during daytime naps. ? While taking prescription pain medicines, sleeping medicines, or medicines that make you drowsy.  If you smoke, do not smoke without supervision.  Keep all follow-up visits as told by your health care provider. This is important. Contact a health care provider if:  You keep feeling nauseous or you keep vomiting.  You feel light-headed.  You develop a rash.  You have a fever. Get help right away if:  You have trouble breathing. Summary  For several hours after your procedure, you may feel sleepy and have poor judgment.  Have a responsible adult stay with you for at least 24 hours or until you are awake and alert. This information is not intended to replace advice given to you by your health care provider. Make sure you discuss any questions you have with your health care provider. Document Revised: 09/19/2017 Document Reviewed: 10/12/2015 Elsevier Patient Education  2020 Elsevier Inc.  

## 2020-01-14 NOTE — Addendum Note (Signed)
Addendum  created 01/14/20 1445 by Raenette Rover, CRNA   Charge Capture section accepted

## 2020-01-14 NOTE — Interval H&P Note (Signed)
History and Physical Interval Note:  01/14/2020 10:51 AM  [name]  has presented today for surgery, with the diagnosis of Nuclear sclerotic cataract - Left eye.  The various methods of treatment have been discussed with the patient and family. After consideration of risks, benefits and other options for treatment, the patient has consented to  Procedure(s) with comments: CATARACT EXTRACTION PHACO AND INTRAOCULAR LENS PLACEMENT ([place]) (Left) - left as a surgical intervention.  The patient's history has been reviewed, patient examined, no change in status, stable for surgery.  I have reviewed the patient's chart and labs.  Questions were answered to the patient's satisfaction.     Baruch Goldmann

## 2020-01-14 NOTE — Telephone Encounter (Signed)
Spoke with patient, advised of instructions per Derl Barrow NP.  Appointment made for September to f./u with Dr. Shearon Stalls.  Order sent to [employer] for portable oxygen.  Nothing further needed.

## 2020-01-14 NOTE — Progress Notes (Signed)
Patient completed "my at home breathing treatment, want to do a treatment before I go to the office after surgery " . All portable oxygen and breathing treatement apparatus with patient at discharge, husband with patient.  Discharged with husband via [employer] transportation.

## 2020-01-14 NOTE — Telephone Encounter (Signed)
Patient states that she started coughing and wheezing and it got worse about 2-3 days ago and she just started coughing up yellow sputum today. Patient is requesting to have an antibiotic sent into pharmacy [facility].  Patient also requesting to get order sent in for her to get continuous oxygen instead of her portable pulsed oxygen.  Please advise on both

## 2020-01-14 NOTE — Telephone Encounter (Signed)
Last seen by EW NP.  Please route to her for follow-up.Darryll Raju, FNP

## 2020-01-14 NOTE — Telephone Encounter (Signed)
Patient states that she started coughing and wheezing and it got worse about 2-3 days ago and she just started coughing up yellow sputum today. Patient is requesting to have an antibiotic sent into pharmacy [facility].  Patient also requesting to get order sent in for her to get continuous oxygen instead of her portable pulsed oxygen.  Please advise on both

## 2020-01-14 NOTE — Transfer of Care (Signed)
Immediate Anesthesia Transfer of Care Note  Patient: [name]  Procedure(s) Performed: CATARACT EXTRACTION PHACO AND INTRAOCULAR LENS PLACEMENT LEFT EYE (Left Eye)  Patient Location: PACU  Anesthesia Type:MAC  Level of Consciousness: awake, alert , oriented and patient cooperative  Airway & Oxygen Therapy: Patient Spontanous Breathing and Patient connected to nasal cannula oxygen  Post-op Assessment: Report given to RN and Post -op Vital signs reviewed and stable  Post vital signs: Reviewed and stable  Last Vitals: see PACU phase 2 for VS: VSS Vitals Value Taken Time  BP    Temp    Pulse    Resp    SpO2      Last Pain:  Vitals:   01/14/20 1015  PainSc: 0-No pain         Complications: No complications documented.

## 2020-01-14 NOTE — Anesthesia Postprocedure Evaluation (Signed)
Anesthesia Post Note  Patient: [name]  Procedure(s) Performed: CATARACT EXTRACTION PHACO AND INTRAOCULAR LENS PLACEMENT LEFT EYE (Left Eye)  Patient location during evaluation: Phase II Anesthesia Type: MAC Level of consciousness: awake, oriented, awake and alert and patient cooperative Pain management: pain level controlled Vital Signs Assessment: post-procedure vital signs reviewed and stable Respiratory status: spontaneous breathing, respiratory function stable and patient connected to nasal cannula oxygen Cardiovascular status: blood pressure returned to baseline and stable Anesthetic complications: no   No complications documented.   Last Vitals:  Vitals:   01/14/20 1015 01/14/20 1030  BP: 129/71   Pulse: 88 78  Resp: (!) 23 18  Temp: 36.7 C   SpO2: 98% 99%    Last Pain:  Vitals:   01/14/20 1015  PainSc: 0-No pain                 Babatunde Seago L

## 2020-01-15 ENCOUNTER — Encounter: Payer: Medicaid Other | Admitting: Sports Medicine

## 2020-01-15 ENCOUNTER — Encounter (HOSPITAL_COMMUNITY): Payer: Self-pay | Admitting: Ophthalmology

## 2020-01-16 ENCOUNTER — Telehealth (HOSPITAL_COMMUNITY): Payer: Self-pay | Admitting: Physical Therapy

## 2020-01-16 ENCOUNTER — Ambulatory Visit (HOSPITAL_COMMUNITY): Payer: Medicaid Other | Admitting: Physical Therapy

## 2020-01-16 NOTE — Telephone Encounter (Signed)
S/w patient - She wanted to close this referral  for LBP and get a new one after her foot surgery if over

## 2020-01-21 ENCOUNTER — Telehealth: Payer: Self-pay | Admitting: Internal Medicine

## 2020-01-21 MED ORDER — PREDNISONE 10 MG PO TABS
ORAL_TABLET | ORAL | 0 refills | Status: DC
Start: 2020-01-21 — End: 2020-02-13

## 2020-01-21 NOTE — Telephone Encounter (Signed)
Spoke with pt, aware of recs.  rx sent to preferred pharmacy.  Nothing further needed at this time- will close encounter.   

## 2020-01-21 NOTE — Telephone Encounter (Signed)
Called and spoke with pt. Pt stated that she took last abx 2 days ago and is still coughing white to yellow-green phlegm. Pt states when she takes in a deep breath she has some pain.  Pt denies any complaints of fever as last temp was 98.0.  Pt states that she still has some mild wheeze but is better than it was.  Pt is having to use her nebulizer at least 5 times daily to see if she is able to get any relief.  Since pt has finished her abx and is still having symptoms, she wants to know what we recommend. Beth, please advise.

## 2020-01-21 NOTE — Telephone Encounter (Signed)
Can you send in prednisone taper 40mg  x 2 days; 30mg  x 2 days; 20mg  x 2 days; 10mg  x 2 days. Continue mucinex 600mg  1-2 tabs twice daily and nebulizers every 6 hours.

## 2020-01-22 ENCOUNTER — Encounter: Payer: Medicaid Other | Admitting: Sports Medicine

## 2020-01-22 NOTE — Pre-Procedure Instructions (Signed)
[address], [place] [address] Phone: [phone] Fax: [phone]      Your procedure is scheduled on Monday, July 26th .  Report to [facility] Main Entrance "A" at 10:50 A.M., and check in at the Admitting office.  Call this number if you have problems the morning of surgery:  [phone]  Call [phone] if you have any questions prior to your surgery date Monday-Friday 8am-4pm    Remember:  Do not eat or drink after midnight the night before your surgery    Take these medicines the morning of surgery with A SIP OF WATER   albuterol-ipratropium (COMBIVENT) budesonide-formoterol (SYMBICORT) cycloSPORINE (RESTASIS) doxepin (SINEQUAN) esomeprazole (NEXIUM)  ILEVRO  moxifloxacin (VIGAMOX) prednisoLONE acetate (PRED FORTE) rOPINIRole (REQUIP) simvastatin (ZOCOR)   As Needed: albuterol (PROVENTIL)  cetirizine (ZYRTEC)  fluticasone (FLONASE) gabapentin (NEURONTIN) HYDROcodone-acetaminophen (NORCO/VICODIN) ipratropium-albuterol (DUONEB) nabumetone (RELAFEN)  As of today, STOP taking any Aspirin (unless otherwise instructed by your surgeon) Aleve, Naproxen, Ibuprofen, Motrin, Advil, Goody's, BC's, all herbal medications, fish oil, and all vitamins.  WHAT DO I DO ABOUT MY DIABETES MEDICATION?  . Do NOT TAKE metFORMIN (GLUCOPHAGE) the day of surgery.   HOW TO MANAGE YOUR DIABETES BEFORE AND AFTER SURGERY  Why is it important to control my blood sugar before and after surgery? . Improving blood sugar levels before and after surgery helps healing and can limit problems. . A way of improving blood sugar control is eating a healthy diet by: o  Eating less sugar and carbohydrates o  Increasing activity/exercise o  Talking with your doctor about reaching your blood sugar goals . High blood sugars (greater than 180 mg/dL) can raise your risk of infections and slow your recovery, so you will need to focus on  controlling your diabetes during the weeks before surgery. . Make sure that the doctor who takes care of your diabetes knows about your planned surgery including the date and location.  How do I manage my blood sugar before surgery? . Check your blood sugar at least 4 times a day, starting 2 days before surgery, to make sure that the level is not too high or low. . Check your blood sugar the morning of your surgery when you wake up and every 2 hours until you get to the Short Stay unit. o If your blood sugar is less than 70 mg/dL, you will need to treat for low blood sugar: - Do not take insulin. - Treat a low blood sugar (less than 70 mg/dL) with  cup of clear juice (cranberry or apple), 4 glucose tablets, OR glucose gel. - Recheck blood sugar in 15 minutes after treatment (to make sure it is greater than 70 mg/dL). If your blood sugar is not greater than 70 mg/dL on recheck, call [phone] for further instructions. . Report your blood sugar to the short stay nurse when you get to Short Stay.  . If you are admitted to the hospital after surgery: o Your blood sugar will be checked by the staff and you will probably be given insulin after surgery (instead of oral diabetes medicines) to make sure you have good blood sugar levels. o The goal for blood sugar control after surgery is 80-180 mg/dL.                Do not wear jewelry, make up, or nail polish            Do not wear lotions,  powders, perfumes, or deodorant.            Do not shave 48 hours prior to surgery.             Do not bring valuables to the hospital.            [facility] is not responsible for any belongings or valuables.  Do NOT Smoke (Tobacco/Vaping) or drink Alcohol 24 hours prior to your procedure If you use a CPAP at night, you may bring all equipment for your overnight stay.   Contacts, glasses, dentures or bridgework may not be worn into surgery.      For patients admitted to the hospital, discharge time will  be determined by your treatment team.   Patients discharged the day of surgery will not be allowed to drive home, and someone needs to stay with them for 24 hours.    Special instructions:   [facility]- Preparing For Surgery  Before surgery, you can play an important role. Because skin is not sterile, your skin needs to be as free of germs as possible. You can reduce the number of germs on your skin by washing with CHG (chlorahexidine gluconate) Soap before surgery.  CHG is an antiseptic cleaner which kills germs and bonds with the skin to continue killing germs even after washing.    Oral Hygiene is also important to reduce your risk of infection.  Remember - BRUSH YOUR TEETH THE MORNING OF SURGERY WITH YOUR REGULAR TOOTHPASTE  Please do not use if you have an allergy to CHG or antibacterial soaps. If your skin becomes reddened/irritated stop using the CHG.  Do not shave (including legs and underarms) for at least 48 hours prior to first CHG shower. It is OK to shave your face.  Please follow these instructions carefully.   1. Shower the NIGHT BEFORE SURGERY and the MORNING OF SURGERY with CHG Soap.   2. If you chose to wash your hair, wash your hair first as usual with your normal shampoo.  3. After you shampoo, rinse your hair and body thoroughly to remove the shampoo.  4. Use CHG as you would any other liquid soap. You can apply CHG directly to the skin and wash gently with a scrungie or a clean washcloth.   5. Apply the CHG Soap to your body ONLY FROM THE NECK DOWN.  Do not use on open wounds or open sores. Avoid contact with your eyes, ears, mouth and genitals (private parts). Wash Face and genitals (private parts)  with your normal soap.   6. Wash thoroughly, paying special attention to the area where your surgery will be performed.  7. Thoroughly rinse your body with warm water from the neck down.  8. DO NOT shower/wash with your normal soap after using and rinsing off the  CHG Soap.  9. Pat yourself dry with a CLEAN TOWEL.  10. Wear CLEAN PAJAMAS to bed the night before surgery  11. Place CLEAN SHEETS on your bed the night of your first shower and DO NOT SLEEP WITH PETS.   Day of Surgery: Wear Clean/Comfortable clothing the morning of surgery Do not apply any deodorants/lotions.   Remember to brush your teeth WITH YOUR REGULAR TOOTHPASTE.   Please read over the following fact sheets that you were given.

## 2020-01-23 ENCOUNTER — Other Ambulatory Visit: Payer: Self-pay

## 2020-01-23 ENCOUNTER — Encounter (HOSPITAL_COMMUNITY)
Admission: RE | Admit: 2020-01-23 | Discharge: 2020-01-23 | Disposition: A | Discharge status: Home / Self Care | Payer: Medicaid Other | Source: Ambulatory Visit | Attending: Sports Medicine | Admitting: Sports Medicine

## 2020-01-23 ENCOUNTER — Encounter (HOSPITAL_COMMUNITY): Payer: Self-pay

## 2020-01-23 DIAGNOSIS — Z01812 Encounter for preprocedural laboratory examination: Secondary | ICD-10-CM | POA: Insufficient documentation

## 2020-01-23 HISTORY — DX: Dependence on supplemental oxygen: Z99.81

## 2020-01-23 HISTORY — DX: Myoneural disorder, unspecified: G70.9

## 2020-01-23 HISTORY — DX: Headache, unspecified: R51.9

## 2020-01-23 HISTORY — DX: Other specified diseases of upper respiratory tract: J39.8

## 2020-01-23 HISTORY — DX: Personal history of other diseases of the digestive system: Z87.19

## 2020-01-23 HISTORY — DX: Dyspnea, unspecified: R06.00

## 2020-01-23 HISTORY — DX: Anemia, unspecified: D64.9

## 2020-01-23 LAB — CBC
HCT: 39 % (ref 36.0–46.0)
Hemoglobin: 12.7 g/dL (ref 12.0–15.0)
MCH: 31.7 pg (ref 26.0–34.0)
MCHC: 32.6 g/dL (ref 30.0–36.0)
MCV: 97.3 fL (ref 80.0–100.0)
Platelets: 383 10*3/uL (ref 150–400)
RBC: 4.01 MIL/uL (ref 3.87–5.11)
RDW: 12.3 % (ref 11.5–15.5)
WBC: 10.4 10*3/uL (ref 4.0–10.5)
nRBC: 0 % (ref 0.0–0.2)

## 2020-01-23 LAB — BASIC METABOLIC PANEL
Anion gap: 9 (ref 5–15)
BUN: 12 mg/dL (ref 8–23)
CO2: 29 mmol/L (ref 22–32)
Calcium: 11.3 mg/dL — ABNORMAL HIGH (ref 8.9–10.3)
Chloride: 98 mmol/L (ref 98–111)
Creatinine, Ser: 0.39 mg/dL — ABNORMAL LOW (ref 0.44–1.00)
GFR calc Af Amer: >60 mL/min (ref >60)
GFR calc non Af Amer: >60 mL/min (ref >60)
Glucose, Bld: 108 mg/dL — ABNORMAL HIGH (ref 70–99)
Potassium: 4 mmol/L (ref 3.5–5.1)
Sodium: 136 mmol/L (ref 135–145)

## 2020-01-23 LAB — GLUCOSE, CAPILLARY: Glucose-Capillary: 121 mg/dL — ABNORMAL HIGH (ref 70–99)

## 2020-01-23 NOTE — H&P (Signed)
Surgical History & Physical  Patient Name: [name] DOB: [birth year]  Surgery: Cataract extraction with intraocular lens implant phacoemulsification; Right Eye  Surgeon: Baruch Goldmann MD Surgery Date:  02/01/2020 Pre-Op Date:  01/21/2020  HPI: A 14 Yr. old female patient 1. 1. The patient is returning after cataract post-op. The left eye is affected. Since the last visit, the affected area feels improvement and is doing well. The condition's severity is none. Patient is following medication instructions. PT states since having the left eye done she is now ready to have the right eye done. She can feel a difference between the two eyes. She reports a decrease in [place] OD at all ranges since having the left eye done. Pt feels this is negatively affecting the patient's quality of life. No issues with dryness, irritation or discomfort. Denies eye pain, flashes and floaters. HPI was performed by Baruch Goldmann .  Medical History: Cataracts Diabetes GERD, Bipolar ,COPD, Restless leg syndrome, Lung Problems  Review of Systems Respiratory COPD All recorded systems are negative except as noted above.  Social   Current every day smoker of Cigarettes 1 pack per day  Medication Prednisolone acetate 1%, Ilevro, Moxifloxacin,  Breathing Treatments, Combivent, Lasix, Lisinopril, Metformin, Nexium,   Sx/Procedures Phaco c IOL,  Tubal Ligation, Wrist surgery,   Drug Allergies  Pencillin, Septra,   History & Physical: Heent:  Cataract, Right eye NECK: supple without bruits LUNGS: lungs clear to auscultation CV: regular rate and rhythm Abdomen: soft and non-tender  Impression & Plan: Assessment: 1.  COMBINED FORMS AGE RELATED CATARACT; Right Eye (H25.811) 2.  CATARACT EXTRACTION STATUS; Left Eye (Z98.42)  Plan: 1.  Right Eye. Dilates poorly - shugacaine by protocol. Malyugin Ring. Omidira. Cataract accounts for the patient's decreased vision. This visual impairment is not  correctable with a tolerable change in glasses or contact lenses. Cataract surgery with an implantation of a new lens should significantly improve the visual and functional status of the patient. Discussed all risks, benefits, alternatives, and potential complications. Discussed the procedures and recovery. Patient desires to have surgery. A-scan ordered and performed today for intra-ocular lens calculations. The surgery will be performed in order to improve vision for driving, reading, and for eye examinations. Recommend phacoemulsification with intra-ocular lens. Surgery required to correct imbalance of vision. 2.  1 week after cataract surgery. Doing well with improved vision and normal eye pressure. Call with any problems or concerns. Stop Vigamox. Continue Ilevro 1 drop 1x/day for 3 more weeks. Continue Pred Acetate 1 drop 2x/day for 3 more weeks.

## 2020-01-23 NOTE — Anesthesia Preprocedure Evaluation (Addendum)
Anesthesia Evaluation  Patient identified by MRN, date of birth, ID band Patient awake    Reviewed: Allergy & Precautions, NPO status , Patient's Chart, lab work & pertinent test results  Airway Mallampati: II  TM Distance: >3 FB Neck ROM: Full    Dental no notable dental hx.    Pulmonary COPD, Current Smoker and Patient abstained from smoking.,  Recent URI  Nebulizer given preop  breath sounds clear to auscultation + wheezing      Cardiovascular hypertension, +CHF  Normal cardiovascular exam Rhythm:Regular Rate:Normal  1. Left ventricular ejection fraction, by visual estimation, is 60 to  65%. The left ventricle has normal function. There is no left ventricular  hypertrophy.  2. The left ventricle has no regional wall motion abnormalities.  3. Global right ventricle has normal systolic function.The right  ventricular size is normal. No increase in right ventricular wall  thickness.  4. Left atrial size was upper normal.  5. Right atrial size was normal.  6. The mitral valve is grossly normal. Trivial mitral valve  regurgitation.  7. The tricuspid valve is grossly normal. Tricuspid valve regurgitation  is trivial.  8. The aortic valve is tricuspid. Aortic valve regurgitation is not  visualized.  9. The pulmonic valve was grossly normal. Pulmonic valve regurgitation is  trivial.  10. TR signal is inadequate for assessing pulmonary artery systolic  pressure.  11. The inferior vena cava is normal in size with greater than 50%  respiratory variability, suggesting right atrial pressure of 3 mmHg.    Neuro/Psych  Headaches, PSYCHIATRIC DISORDERS Anxiety Depression Bipolar Disorder  Neuromuscular disease    GI/Hepatic hiatal hernia, GERD  Medicated and Controlled,  Endo/Other  diabetes  Renal/GU      Musculoskeletal   Abdominal   Peds  Hematology negative hematology ROS (+)   Anesthesia Other Findings    Reproductive/Obstetrics                           Anesthesia Physical Anesthesia Plan  ASA: III  Anesthesia Plan: MAC   Post-op Pain Management:  Regional for Post-op pain   Induction: Intravenous  PONV Risk Score and Plan: 2 and Ondansetron and Dexamethasone  Airway Management Planned: Simple Face Mask  Additional Equipment:   Intra-op Plan:   Post-operative Plan:   Informed Consent: I have reviewed the patients History and Physical, chart, labs and discussed the procedure including the risks, benefits and alternatives for the proposed anesthesia with the patient or authorized representative who has indicated his/her understanding and acceptance.     Dental advisory given  Plan Discussed with: CRNA and Surgeon  Anesthesia Plan Comments: (PAT note written by Myra Gianotti, PA-C. )       Anesthesia Quick Evaluation

## 2020-01-23 NOTE — Progress Notes (Signed)
PCP - Jani Gravel, MD Cardiologist - Carlyle Dolly, MD; Bernerd Pho, PA-C Pulmonologist- Lenice Llamas, MD  PPM/ICD - Denies  Chest x-ray - N/A EKG - 01/11/20 Stress Test - 2015 ECHO - 06/14/19 Cardiac Cath - Denies  Sleep Study - Yes, negative for OSA CPAP - No  Fasting Blood Sugar: 114-180 Checks Blood Sugar 1 time a day. Last A1C 6.4 on 01/11/20. CBG at PAT appointment was 121.  Blood Thinner Instructions: N/A Aspirin Instructions: N/A  ERAS Protcol - No PRE-SURGERY Ensure or G2- N/A  COVID TEST- DOS since pt is arriving via Transportation services.   Anesthesia review: Yes, per MD order.  Patient denies shortness of breath, fever, cough and chest pain at PAT appointment   All instructions explained to the patient, with a verbal understanding of the material. Patient agrees to go over the instructions while at home for a better understanding. Patient also instructed to self quarantine after being tested for COVID-19. The opportunity to ask questions was provided.

## 2020-01-23 NOTE — Progress Notes (Addendum)
Elevated BP at PAT appointment: Per patient she has been out of her BP medicine for a week and plans to get it today. Pt denies CP, however she does have a strong, persistent, productive cough which she says she has often. She is a current 1.5 pk/day smoker with COPD and continuous home O2 dependent (3-4.5 L). Myra Gianotti, PA-C made aware.   01/23/20 1018 01/23/20 1023  Vitals  Temp 98.9 F (37.2 C)  --   Temp Source Oral  --   Pulse Rate 88  --   Pulse Rate Source Dinamap  --   Resp 20  --   BP (!) 114/103 (Left Arm) (!) 115/102 (Right Arm)  SpO2 94 %  --   Oxygen Therapy  O2 Device Nasal Cannula  --   O2 Flow Rate (L/min) 3.5 L/min  --   Height and Weight  Height 5\' 4"  (1.626 m)  --   Height Method Stated  --   Weight 98.6 kg  --   Weight Method Actual  --   BMI (Calculated) 37.28  --   BSA (Calculated - sq m) 2.11 sq meters  --

## 2020-01-23 NOTE — Progress Notes (Signed)
Anesthesia Note:   Case: [id number] Date/Time: 01/28/20 1235   Procedure: [name] BUNION IMPLANT RIGHT FOOT CHEILECTOMY RIGHT ROOT (Right ) - BLOCK   Anesthesia type: Regional   Pre-op diagnosis: HALLUX RIGIDUS   Location: [facility] OR ROOM 08 / [facility]   Surgeons: Landis Martins, DPM      DISCUSSION: Patient is a 64 year old female scheduled for the above procedure.  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 BMI is consistent with obesity.  Left eye cataract extraction 01/14/20 by Baruch Goldmann, MD under MAC anesthesia. Scheduled for right cataract extraction on 02/01/20.   Last pulmonology evaluation (virtual visit) 12/24/19 by Geraldo Pitter, NP for follow-up sleep study results. She was doing pretty well. Has chronic cough, clear mucous at that time. She was wearing 4.5 L O2. She continued to smoke and did not have a home pulse oximeter. Continue Symbicort BID and Combivent/Duoneb as needed. No evidence of obstruction on in lab sleep study, but may consider trilogy ventilator in the future if needed.  Strongly encourage patient to quit smoking.  65-monthfollow-up with Dr. DShearon Stallsrecommended. - Since then, she contacted pulmonology on 01/14/20 reporting worsening cough/wheezing 2-3 days prior to yellow-green sputum. [place], flutter valve. She was still having symptoms as of 01/21/20, although somewhat improved and prescribed prednisone taper (414mx 2 days; 3070m 2 days; 42m18m2 days; 10mg48m days) and continue Mucinex and Q 6 hour nebs. She is planning to pick up  and start prednisone taper on 01/23/20. As of 01/23/20, patient says her respiratory symptoms continue to improve. She feels about 80% recovered. Sputum is now whitish-yellow. Wheezing better. Still have bouts of coughing that clears after coughing up secretions, but says this is improving as well. She continues to uses Mucinex BID and nebulizers ~ every 4 hours. No fever. She reports a  baseline chronic intermittent productive cough (clear-white sputum) with intermittent SOB even at rest on some days (even during non-exacerbation periods) that can be followed by days where she is able to move around her house without SOB or O2 use. She typically uses 4-4.5L/[place] (has pulsed portable machine with her at PAT) continuous. She has not purchased a home pulse oximeter yet. Lungs sounds show scattered inspiratory and expiratory wheezes, intermittently coarse that would show some improvement after coughing. No definite crackles noted. Heart RRR, no murmur noted. No ankle edema. She did have a coughing spell during my evaluation, but otherwise appeared to be breathing comfortably during evaluation without significant conversational dyspnea. Voice was somewhat raspy. She was in a hospital wheelchair. She says she sleeps on her stomach or back, typically using 1-2 pillows.  Last visit with cardiology on 06/19/19. Had pleuritic-type chest pain for a few days with COPD exacerbation/increased wheezing, but now resolved and otherwise denied chest pain. Takes torsemide as needed for edema (1/2 tab earlier this week x1), but overall says fluid status has been good.   H&P form completed by MarrsGuido Sander-BC on 01/01/20.   Case is posted for regional anesthesia. She is recovering from a COPD exacerbation. Although, she is feeling much better, she is not quite at baseline  and is still wheezing on exam. She actually looked okay on exam, but did have 2 coughing spells while trying to clear mucous during stay at PAT visit. She  will continue Mucinex and nebs and plans to start prednisone taper as ordered. Hopefully, she will continue to improve over the next several days; however, discussed that should her symptoms persist/worsen then she should notify Dr. Burnell Blanks as my concern would be that she would be a risk for cancellation should she arrive with significant wheezes. Staff message sent to Dr. Cannon Kettle regarding this, including that patient to start prednisoe. Reviewed with anesthesiologist Lillia Abed, MD.   She relies on medical transportation, so is scheduled to get her COVID test on the day of surgery.    VS: BP (!) 114/103 Comment: Left Arm  Pulse 88   Temp 37.2 C (Oral)   Resp 20   Ht _0  (1.626 m)   Wt 98.6 kg   SpO2 94%   BMI 37.30 kg/m  She ran out of lisinopril last week and plans to refill and resume on 01/23/20.  BP Readings from Last 3 Encounters:  01/23/20 (!) 115/102  01/14/20 130/86  08/29/19 126/60     PROVIDERS: Jani Gravel, MD is listed as PCP  Lenice Llamas, MD is pulmonologist. See above. Next visit scheduled for 03/17/20.  Carlyle Dolly, MD is cardiologist. Last evaluation 06/19/19 with Bernerd Pho, PA-C. Edema better on torsemide. No recent chest pain. Six month follow-up planned.   LABS: Labs reviewed: Acceptable for surgery. A1c 6.4% 01/11/20.  (all labs ordered are listed, but only abnormal results are displayed)  Labs Reviewed  BASIC METABOLIC PANEL - Abnormal; Notable for the following components:      Result Value   Glucose, Bld 108 (*)    Creatinine, Ser 0.39 (*)    Calcium 11.3 (*)    All other components within normal limits  GLUCOSE, CAPILLARY - Abnormal; Notable for the following components:   Glucose-Capillary 121 (*)    All other components within normal limits  CBC    OTHER:  NPSG 11/08/19: The overall apnea/hypopnea index (AHI) was 0.4 per hour. There were 0 total apneas, including 0 obstructive, 0 central and 0 mixed apneas. There were 2  hypopneas and 5 RERAs. The AHI during Stage REM sleep was 2.0 per hour. AHI while supine was N/A per hour. The mean oxygen saturation was 91.5%. The minimum SpO2 during sleep was 75.0%. She required the use of 2 liters oxygen during this study.  Spirometry 07/14/16: FVC 1.48 (45%), FEV 1.29 (51%), FEV1/FVC 87% (111%), FEF25-75% 1.68 (72%)   IMAGES: CT Chest high resolution 09/18/19: IMPRESSION: 1. Slight improvement in widespread patchy areas of ground-glass attenuation throughout the lungs bilaterally, favored to reflect regression of a smoking related disease such as disclaimer of interstitial pneumonia. Given the lack of centrilobular ground-glass attenuation micro nodularity, respiratory bronchiolitis interstitial lung disease is less favored. 2. Severe tracheomalacia. 3. New 6 mm left upper lobe pulmonary nodule (axial image 18 of series 5). Non-contrast chest CT at 6-12 months is recommended. If the nodule is stable at time of repeat CT, then future CT at 18-24 months (from today's scan) is considered optional for low-risk patients, but is recommended for high-risk patients. This recommendation follows the consensus statement: Guidelines for Management of Incidental Pulmonary Nodules Detected on CT Images: From the Fleischner Society 2017; Radiology 2017; 284:228-243. 4. Aortic atherosclerosis, in addition to left main and 3 vessel coronary artery disease. Please note that although  the presence of coronary artery calcium documents the presence of coronary artery disease, the severity of this disease and any potential stenosis cannot be assessed on this non-gated CT examination. Assessment for potential risk factor modification, dietary therapy or pharmacologic therapy may be warranted, if clinically indicated. - Aortic Atherosclerosis (ICD10-I70.0).   EKG: 01/11/20: NSR. Rightward axis.   CV: Echo 06/14/19: IMPRESSIONS  1. Left ventricular ejection fraction, by  visual estimation, is 60 to  65%. The left ventricle has normal function. There is no left ventricular  hypertrophy.  2. The left ventricle has no regional wall motion abnormalities.  3. Global right ventricle has normal systolic function.The right  ventricular size is normal. No increase in right ventricular wall  thickness.  4. Left atrial size was upper normal.  5. Right atrial size was normal.  6. The mitral valve is grossly normal. Trivial mitral valve  regurgitation.  7. The tricuspid valve is grossly normal. Tricuspid valve regurgitation  is trivial.  8. The aortic valve is tricuspid. Aortic valve regurgitation is not  visualized.  9. The pulmonic valve was grossly normal. Pulmonic valve regurgitation is  trivial.  10. TR signal is inadequate for assessing pulmonary artery systolic  pressure.  11. The inferior vena cava is normal in size with greater than 50%  respiratory variability, suggesting right atrial pressure of 3 mmHg.    ETT 03/08/14: Conclusions 1. Negative stress EKG for ischemia 2. Below average exercise functional capacity   Cardiac cath 07/14/06:  IMPRESSION:  1. Mild, diffuse luminal irregularity, constituting 10% to 20%      stenosis of the coronary arteries.  No significant coronary artery      disease.  2. Normal LV systolic function. RECOMMENDATIONS:  Risk modification, smoking cessation, and lifestyle  modification is indicated.   Past Medical History:  Diagnosis Date  . Anemia   . Anxiety   . Arthritis   . Bipolar 1 disorder ([place])   . CHF (congestive heart failure) ([place])   . COPD (chronic obstructive pulmonary disease) ([place])   . Depression   . Diabetes mellitus without complication ([place])   . Dyspnea   . Emphysema of lung ([place])   . GERD (gastroesophageal reflux disease)   . Headache    migraines  . History of hiatal hernia   . History of kidney stones   . Hyperlipidemia   . Hypertension   . Neuromuscular disorder ([facility])     neuropathy  . On home oxygen therapy   . Pneumonia 2015, 2019  . Tracheomalacia   . Vaginal Pap smear, abnormal     Past Surgical History:  Procedure Laterality Date  . CATARACT EXTRACTION W/PHACO Left 01/14/2020   Procedure: CATARACT EXTRACTION PHACO AND INTRAOCULAR LENS PLACEMENT LEFT EYE;  Surgeon: Baruch Goldmann, MD;  Location: [facility];  Service: Ophthalmology;  Laterality: Left;  CDE: 8.18  . CHOLECYSTECTOMY    . COLONOSCOPY  July 2010   Dr. Arnoldo Morale: 3 rectal polyps, not enough tissue for pathologic examination, recommended surveillance in 3 years  . COLONOSCOPY N/A 10/23/2014   RMR: Multiple colonic polyps removed as described above. No endoscopic explaniation for abdominal pain. however. next tcs 10/2019  . ESOPHAGOGASTRODUODENOSCOPY  July 2010   Dr. Arnoldo Morale: gastritis and duodenitis, H.pylori negative  . ESOPHAGOGASTRODUODENOSCOPY N/A 10/23/2014   RMR: Normal EGD. Status post passage of a Maloney dilator. Today's finding s would not explain abdominal pain  . GANGLION CYST EXCISION Left 09/07/2018   Procedure: REMOVAL GANGLION OF WRIST;  Surgeon:  Carole Civil, MD;  Location: [facility];  Service: Orthopedics;  Laterality: Left;  . HERNIA REPAIR     Dr. Arnoldo Morale  . KIDNEY STONE SURGERY    . MALONEY DILATION N/A 10/23/2014   Procedure: Venia Minks DILATION;  Surgeon: Daneil Dolin, MD;  Location: [facility];  Service: Endoscopy;  Laterality: N/A;  . OPEN REDUCTION INTERNAL FIXATION (ORIF) DISTAL RADIAL FRACTURE Right 12/09/2015   Procedure: OPEN REDUCTION INTERNAL FIXATION (ORIF) RIGHT DISTAL RADIUS;  Surgeon: Leanora Cover, MD;  Location: [facility];  Service: Orthopedics;  Laterality: Right;    MEDICATIONS: . Accu-Chek FastClix Lancets MISC  . ACCU-CHEK GUIDE test strip  . albuterol (PROVENTIL) (2.5 MG/3ML) 0.083% nebulizer solution  . albuterol-ipratropium (COMBIVENT) 18-103 MCG/ACT inhaler  . azithromycin (ZITHROMAX) 250 MG tablet  . budesonide-formoterol  (SYMBICORT) 160-4.5 MCG/ACT inhaler  . cetirizine (ZYRTEC) 10 MG tablet  . cycloSPORINE (RESTASIS) 0.05 % ophthalmic emulsion  . diclofenac Sodium (VOLTAREN) 1 % GEL  . doxepin (SINEQUAN) 10 MG capsule  . esomeprazole (NEXIUM) 20 MG capsule  . fluticasone (FLONASE) 50 MCG/ACT nasal spray  . gabapentin (NEURONTIN) 800 MG tablet  . HYDROcodone-acetaminophen (NORCO/VICODIN) 5-325 MG tablet  . ILEVRO 0.3 % ophthalmic suspension  . ipratropium-albuterol (DUONEB) 0.5-2.5 (3) MG/3ML SOLN  . lisinopril (ZESTRIL) 30 MG tablet  . metFORMIN (GLUCOPHAGE) 1000 MG tablet  . moxifloxacin (VIGAMOX) 0.5 % ophthalmic solution  . nabumetone (RELAFEN) 750 MG tablet  . nitroGLYCERIN (NITROSTAT) 0.4 MG SL tablet  . OXYGEN  . prednisoLONE acetate (PRED FORTE) 1 % ophthalmic suspension  . predniSONE (DELTASONE) 10 MG tablet  . rOPINIRole (REQUIP) 1 MG tablet  . simvastatin (ZOCOR) 10 MG tablet  . torsemide (DEMADEX) 20 MG tablet   . triamcinolone acetonide (KENALOG) 10 MG/ML injection 10 mg     Myra Gianotti, PA-C Surgical Short Stay/Anesthesiology [facility] Phone [phone] [facility] Phone [phone] 01/23/2020 1:43 PM

## 2020-01-28 ENCOUNTER — Ambulatory Visit (HOSPITAL_COMMUNITY)
Admission: RE | Admit: 2020-01-28 | Discharge: 2020-01-28 | Disposition: A | Discharge status: Home / Self Care | Payer: Medicaid Other | Attending: Sports Medicine | Admitting: Sports Medicine

## 2020-01-28 ENCOUNTER — Other Ambulatory Visit: Payer: Self-pay

## 2020-01-28 ENCOUNTER — Encounter (HOSPITAL_COMMUNITY)
Admission: RE | Disposition: A | Discharge status: Home / Self Care | Payer: Self-pay | Source: Home / Self Care | Attending: Sports Medicine

## 2020-01-28 ENCOUNTER — Encounter (HOSPITAL_COMMUNITY): Payer: Self-pay | Admitting: Sports Medicine

## 2020-01-28 ENCOUNTER — Other Ambulatory Visit: Payer: Self-pay | Admitting: Internal Medicine

## 2020-01-28 ENCOUNTER — Ambulatory Visit (HOSPITAL_COMMUNITY): Payer: Medicaid Other | Admitting: Vascular Surgery

## 2020-01-28 ENCOUNTER — Ambulatory Visit (HOSPITAL_COMMUNITY): Payer: Medicaid Other | Admitting: Certified Registered"

## 2020-01-28 DIAGNOSIS — E785 Hyperlipidemia, unspecified: Secondary | ICD-10-CM | POA: Insufficient documentation

## 2020-01-28 DIAGNOSIS — M19071 Primary osteoarthritis, right ankle and foot: Secondary | ICD-10-CM | POA: Insufficient documentation

## 2020-01-28 DIAGNOSIS — I509 Heart failure, unspecified: Secondary | ICD-10-CM | POA: Diagnosis not present

## 2020-01-28 DIAGNOSIS — Z79899 Other long term (current) drug therapy: Secondary | ICD-10-CM | POA: Diagnosis not present

## 2020-01-28 DIAGNOSIS — J449 Chronic obstructive pulmonary disease, unspecified: Secondary | ICD-10-CM | POA: Diagnosis not present

## 2020-01-28 DIAGNOSIS — Z7951 Long term (current) use of inhaled steroids: Secondary | ICD-10-CM | POA: Diagnosis not present

## 2020-01-28 DIAGNOSIS — E669 Obesity, unspecified: Secondary | ICD-10-CM | POA: Diagnosis not present

## 2020-01-28 DIAGNOSIS — I110 Hypertensive heart disease with heart failure: Secondary | ICD-10-CM | POA: Diagnosis not present

## 2020-01-28 DIAGNOSIS — M25871 Other specified joint disorders, right ankle and foot: Secondary | ICD-10-CM | POA: Diagnosis not present

## 2020-01-28 DIAGNOSIS — M13871 Other specified arthritis, right ankle and foot: Secondary | ICD-10-CM | POA: Diagnosis not present

## 2020-01-28 DIAGNOSIS — E119 Type 2 diabetes mellitus without complications: Secondary | ICD-10-CM | POA: Diagnosis not present

## 2020-01-28 DIAGNOSIS — Z7984 Long term (current) use of oral hypoglycemic drugs: Secondary | ICD-10-CM | POA: Diagnosis not present

## 2020-01-28 DIAGNOSIS — K219 Gastro-esophageal reflux disease without esophagitis: Secondary | ICD-10-CM | POA: Insufficient documentation

## 2020-01-28 DIAGNOSIS — F17210 Nicotine dependence, cigarettes, uncomplicated: Secondary | ICD-10-CM | POA: Diagnosis not present

## 2020-01-28 DIAGNOSIS — M67471 Ganglion, right ankle and foot: Secondary | ICD-10-CM | POA: Diagnosis not present

## 2020-01-28 DIAGNOSIS — Z6837 Body mass index (BMI) 37.0-37.9, adult: Secondary | ICD-10-CM | POA: Insufficient documentation

## 2020-01-28 DIAGNOSIS — Z20822 Contact with and (suspected) exposure to covid-19: Secondary | ICD-10-CM | POA: Insufficient documentation

## 2020-01-28 DIAGNOSIS — M2021 Hallux rigidus, right foot: Secondary | ICD-10-CM | POA: Insufficient documentation

## 2020-01-28 HISTORY — PX: CHEILECTOMY: SHX1336

## 2020-01-28 LAB — SARS CORONAVIRUS 2 BY RT PCR (HOSPITAL ORDER, PERFORMED IN ~~LOC~~ HOSPITAL LAB): SARS Coronavirus 2: NEGATIVE

## 2020-01-28 LAB — GLUCOSE, CAPILLARY: Glucose-Capillary: 170 mg/dL — ABNORMAL HIGH (ref 70–99)

## 2020-01-28 SURGERY — CHEILECTOMY
Anesthesia: Monitor Anesthesia Care | Laterality: Right

## 2020-01-28 MED ORDER — MIDAZOLAM HCL 2 MG/2ML IJ SOLN
INTRAMUSCULAR | Status: AC
  Filled 2020-01-28: qty 2

## 2020-01-28 MED ORDER — FENTANYL CITRATE (PF) 100 MCG/2ML IJ SOLN: 25.0000 ug | INTRAMUSCULAR | Status: DC | PRN

## 2020-01-28 MED ORDER — FENTANYL CITRATE (PF) 250 MCG/5ML IJ SOLN
INTRAMUSCULAR | Status: AC
  Filled 2020-01-28: qty 5

## 2020-01-28 MED ORDER — ALBUTEROL SULFATE (2.5 MG/3ML) 0.083% IN NEBU
INHALATION_SOLUTION | RESPIRATORY_TRACT | Status: AC
  Filled 2020-01-28: qty 3

## 2020-01-28 MED ORDER — PHENYLEPHRINE HCL (PRESSORS) 10 MG/ML IV SOLN
INTRAVENOUS | Status: DC | PRN
Start: 2020-01-28 — End: 2020-01-28
  Administered 2020-01-28: 80 ug via INTRAVENOUS

## 2020-01-28 MED ORDER — OXYCODONE HCL 5 MG/5ML PO SOLN: 5.0000 mg | Freq: Once | ORAL | Status: DC | PRN

## 2020-01-28 MED ORDER — ACETAMINOPHEN 160 MG/5ML PO SOLN: 1000.0000 mg | Freq: Once | ORAL | Status: DC | PRN

## 2020-01-28 MED ORDER — PROPOFOL 500 MG/50ML IV EMUL
INTRAVENOUS | Status: DC | PRN
  Administered 2020-01-28: 30 ug/kg/min via INTRAVENOUS

## 2020-01-28 MED ORDER — CHLORHEXIDINE GLUCONATE CLOTH 2 % EX PADS: 6.0000 | MEDICATED_PAD | Freq: Once | CUTANEOUS | Status: DC

## 2020-01-28 MED ORDER — CHLORHEXIDINE GLUCONATE 0.12 % MT SOLN
15.0000 mL | Freq: Once | OROMUCOSAL | Status: AC
  Administered 2020-01-28: 15 mL via OROMUCOSAL
  Filled 2020-01-28: qty 15

## 2020-01-28 MED ORDER — PROMETHAZINE HCL 12.5 MG PO TABS
12.5000 mg | ORAL_TABLET | Freq: Four times a day (QID) | ORAL | 0 refills | Status: DC | PRN
Start: 2020-01-28 — End: 2020-03-13

## 2020-01-28 MED ORDER — LIDOCAINE-EPINEPHRINE (PF) 1.5 %-1:200000 IJ SOLN
INTRAMUSCULAR | Status: DC | PRN
  Administered 2020-01-28: 15 mL

## 2020-01-28 MED ORDER — ALBUTEROL SULFATE (2.5 MG/3ML) 0.083% IN NEBU
2.5000 mg | INHALATION_SOLUTION | Freq: Once | RESPIRATORY_TRACT | Status: AC
  Administered 2020-01-28: 2.5 mg via RESPIRATORY_TRACT

## 2020-01-28 MED ORDER — PROPOFOL 10 MG/ML IV BOLUS
INTRAVENOUS | Status: DC | PRN
  Administered 2020-01-28 (×9): 20 mg via INTRAVENOUS

## 2020-01-28 MED ORDER — ORAL CARE MOUTH RINSE: 15.0000 mL | Freq: Once | OROMUCOSAL | Status: AC

## 2020-01-28 MED ORDER — FENTANYL CITRATE (PF) 100 MCG/2ML IJ SOLN
INTRAMUSCULAR | Status: AC
  Filled 2020-01-28: qty 2

## 2020-01-28 MED ORDER — FENTANYL CITRATE (PF) 250 MCG/5ML IJ SOLN
INTRAMUSCULAR | Status: DC | PRN
  Administered 2020-01-28: 50 ug via INTRAVENOUS
  Administered 2020-01-28: 25 ug via INTRAVENOUS
  Administered 2020-01-28: 50 ug via INTRAVENOUS
  Administered 2020-01-28: 25 ug via INTRAVENOUS

## 2020-01-28 MED ORDER — 0.9 % SODIUM CHLORIDE (POUR BTL) OPTIME
TOPICAL | Status: DC | PRN
  Administered 2020-01-28: 1000 mL

## 2020-01-28 MED ORDER — MIDAZOLAM HCL 2 MG/2ML IJ SOLN
INTRAMUSCULAR | Status: DC | PRN
  Administered 2020-01-28 (×2): 1 mg via INTRAVENOUS

## 2020-01-28 MED ORDER — ACETAMINOPHEN 500 MG PO TABS: 1000.0000 mg | ORAL_TABLET | Freq: Once | ORAL | Status: DC | PRN

## 2020-01-28 MED ORDER — BUPIVACAINE HCL (PF) 0.5 % IJ SOLN
INTRAMUSCULAR | Status: AC
  Filled 2020-01-28: qty 30

## 2020-01-28 MED ORDER — LACTATED RINGERS IV SOLN: INTRAVENOUS | Status: DC

## 2020-01-28 MED ORDER — CLINDAMYCIN PHOSPHATE 900 MG/50ML IV SOLN
900.0000 mg | INTRAVENOUS | Status: AC
  Administered 2020-01-28: 900 mg via INTRAVENOUS
  Filled 2020-01-28: qty 50

## 2020-01-28 MED ORDER — OXYCODONE HCL 5 MG PO TABS: 5.0000 mg | ORAL_TABLET | Freq: Once | ORAL | Status: DC | PRN

## 2020-01-28 MED ORDER — ACETAMINOPHEN 10 MG/ML IV SOLN: 1000.0000 mg | Freq: Once | INTRAVENOUS | Status: DC | PRN

## 2020-01-28 MED ORDER — DOCUSATE SODIUM 100 MG PO CAPS: 100.0000 mg | ORAL_CAPSULE | Freq: Every day | ORAL | 2 refills | Status: AC | PRN

## 2020-01-28 MED ORDER — OXYCODONE-ACETAMINOPHEN 10-325 MG PO TABS: 1.0000 | ORAL_TABLET | Freq: Four times a day (QID) | ORAL | 0 refills | Status: DC | PRN

## 2020-01-28 MED ORDER — ROPIVACAINE HCL 5 MG/ML IJ SOLN
INTRAMUSCULAR | Status: DC | PRN
  Administered 2020-01-28: 6 mL

## 2020-01-28 MED ORDER — BUPIVACAINE HCL 0.5 % IJ SOLN
INTRAMUSCULAR | Status: DC | PRN
  Administered 2020-01-28: 10 mL

## 2020-01-28 SURGICAL SUPPLY — 35 items
APL PRP STRL LF DISP 70% ISPRP (MISCELLANEOUS) ×1
BNDG CMPR 9X4 STRL LF SNTH (GAUZE/BANDAGES/DRESSINGS) ×1
BNDG COHESIVE 3X5 TAN STRL LF (GAUZE/BANDAGES/DRESSINGS) ×1 IMPLANT
BNDG CONFORM 2 STRL LF (GAUZE/BANDAGES/DRESSINGS) ×2 IMPLANT
BNDG ELASTIC 4X5.8 VLCR STR LF (GAUZE/BANDAGES/DRESSINGS) ×3 IMPLANT
BNDG ESMARK 4X9 LF (GAUZE/BANDAGES/DRESSINGS) ×2 IMPLANT
BNDG GAUZE ELAST 4 BULKY (GAUZE/BANDAGES/DRESSINGS) ×1 IMPLANT
CHLORAPREP W/TINT 26 (MISCELLANEOUS) ×2 IMPLANT
DRAPE OEC MINIVIEW 54X84 (DRAPES) ×2 IMPLANT
DRAPE U-SHAPE 47X51 STRL (DRAPES) ×2 IMPLANT
ELECT REM PT RETURN 9FT ADLT (ELECTROSURGICAL) ×2
ELECTRODE REM PT RTRN 9FT ADLT (ELECTROSURGICAL) ×1 IMPLANT
GAUZE SPONGE 4X4 12PLY STRL (GAUZE/BANDAGES/DRESSINGS) ×2 IMPLANT
GAUZE XEROFORM 1X8 LF (GAUZE/BANDAGES/DRESSINGS) ×2 IMPLANT
GLOVE BIO SURGEON STRL SZ7.5 (GLOVE) ×2 IMPLANT
GLOVE BIOGEL PI IND STRL 8 (GLOVE) ×1 IMPLANT
GLOVE BIOGEL PI INDICATOR 8 (GLOVE) ×1
GOWN STRL REUS W/ TWL LRG LVL3 (GOWN DISPOSABLE) ×1 IMPLANT
GOWN STRL REUS W/ TWL XL LVL3 (GOWN DISPOSABLE) ×1 IMPLANT
GOWN STRL REUS W/TWL LRG LVL3 (GOWN DISPOSABLE) ×2
GOWN STRL REUS W/TWL XL LVL3 (GOWN DISPOSABLE) ×2
IMPL TOE SZ 5 (Toe) IMPLANT
IMPLANT TOE SZ 5 (Toe) ×2 IMPLANT
KIT BASIN OR (CUSTOM PROCEDURE TRAY) ×2 IMPLANT
NS IRRIG 1000ML POUR BTL (IV SOLUTION) ×2 IMPLANT
PACK ORTHO EXTREMITY (CUSTOM PROCEDURE TRAY) ×4 IMPLANT
PAD CAST 4YDX4 CTTN HI CHSV (CAST SUPPLIES) ×1 IMPLANT
PADDING CAST COTTON 4X4 STRL (CAST SUPPLIES) ×2
STRIP CLOSURE SKIN 1/4X4 (GAUZE/BANDAGES/DRESSINGS) ×1 IMPLANT
SUT ETHILON 3 0 PS 1 (SUTURE) ×2 IMPLANT
SUT MNCRL AB 3-0 PS2 18 (SUTURE) ×2 IMPLANT
SUT PDS AB 2-0 CT2 27 (SUTURE) ×2 IMPLANT
TOWEL GREEN STERILE FF (TOWEL DISPOSABLE) ×4 IMPLANT
TUBE CONNECTING 20X1/4 (TUBING) ×2 IMPLANT
UNDERPAD 30X36 HEAVY ABSORB (UNDERPADS AND DIAPERS) ×2 IMPLANT

## 2020-01-28 NOTE — Anesthesia Procedure Notes (Signed)
Anesthesia Regional Block: Ankle block   Pre-Anesthetic Checklist: ,, timeout performed, Correct Patient, Correct Site, Correct Laterality, Correct Procedure, Correct Position, site marked, Risks and benefits discussed,  Surgical consent,  Pre-op evaluation,  At surgeon's request and post-op pain management  Laterality: Right  Prep: chloraprep       Needles:  Injection technique: Single-shot  Needle Type: Other      Needle Gauge: 25     Additional Needles:   Narrative:  Start time: 01/28/2020 12:35 PM End time: 01/28/2020 12:39 PM Injection made incrementally with aspirations every 5 mL.  Performed by: Personally  Anesthesiologist: Myrtie Soman, MD  Additional Notes: Patient tolerated the procedure well without complications

## 2020-01-28 NOTE — Discharge Instructions (Signed)
Attached to paper chart

## 2020-01-28 NOTE — Care Management (Signed)
ED CM received call from [facility] in PACU concerning patient needing assisting with  Prescriptions. Patient states she cannot afford to pay for her discharge meds. CM noted patient to be active with [place] medicaid, which covers her prescription with a $3. CM contacted [facility] to request waiving the co-pay. The pharmacy is willing to work with patient. Updated Lanelle Bal RN in PACU

## 2020-01-28 NOTE — Anesthesia Procedure Notes (Signed)
Procedure Name: MAC Date/Time: 01/28/2020 12:52 PM Performed by: Amadeo Garnet, CRNA Pre-anesthesia Checklist: Patient identified, Emergency Drugs available, Suction available and Patient being monitored Patient Re-evaluated:Patient Re-evaluated prior to induction Oxygen Delivery Method: Simple face mask Preoxygenation: Pre-oxygenation with 100% oxygen Induction Type: IV induction Placement Confirmation: positive ETCO2 Dental Injury: Teeth and Oropharynx as per pre-operative assessment

## 2020-01-28 NOTE — H&P (Signed)
HPI:  64 y/o female patient well known to my outpatient clinic seen on several occasions for right big toe joint pain with a cyst that was drained twice and recurred. Patient elected for surgery to excise cyst and to address arthritis of big toe joint with placement of implant. This PM patient denies any pedal complaints. Denies  Nausea/fever/vomiting/chills/night sweats/overnight events.   Confirms NPO status.  Currently getting Neb treatment.     Patient Active Problem List   Diagnosis Date Noted  . Ganglion cyst of dorsum of left wrist s/p removal 09/07/18   . DM (diabetes mellitus), type 2 ([place]) 08/30/2017  . Respiratory bronchiolitis associated interstitial lung disease ([place]) 08/30/2017  . Hyperglycemia 08/26/2017  . Chronic pain 08/25/2017  . COPD with acute exacerbation ([place]) 08/25/2017  . Alcohol abuse 08/01/2015  . Anorgasmia of female 01/01/2015  . Low grade squamous intraepithelial lesion (LGSIL) on Papanicolaou smear of cervix 12/04/2014  . Abdominal pain, chronic, epigastric 11/20/2014  . Dysphagia, pharyngoesophageal phase   . Hx of colonic polyps   . COPD exacerbation ([place]) 07/05/2014  . GERD without esophagitis 07/05/2014  . Hypertension 07/05/2014  . Bipolar 1 disorder ([place]) 07/05/2014  . Anxiety 07/05/2014  . Polysubstance abuse ([place]) 07/05/2014  . Obesity (BMI 30-39.9) 07/05/2014  . Encephalopathy, metabolic 40/34/7425  . Personal history of colonic polyps 04/01/2014  . Mild dysplasia of cervix 11/13/2013  . Acute respiratory failure with hypoxia ([place]) 07/08/2013  . Benzodiazepine dependence ([place]) 08/26/2012  . COPD (chronic obstructive pulmonary disease) ([place]) 08/25/2012  . Panic disorder 08/25/2012  . Tobacco use disorder 08/25/2012  . Hyperlipidemia 08/25/2012      Past Surgical History:  Procedure Laterality Date  . CATARACT EXTRACTION W/PHACO Left 01/14/2020   Procedure: CATARACT EXTRACTION PHACO AND INTRAOCULAR LENS PLACEMENT LEFT EYE;  Surgeon:  Baruch Goldmann, MD;  Location: [facility];  Service: Ophthalmology;  Laterality: Left;  CDE: 8.18  . CHOLECYSTECTOMY    . COLONOSCOPY  July 2010   Dr. Arnoldo Morale: 3 rectal polyps, not enough tissue for pathologic examination, recommended surveillance in 3 years  . COLONOSCOPY N/A 10/23/2014   RMR: Multiple colonic polyps removed as described above. No endoscopic explaniation for abdominal pain. however. next tcs 10/2019  . ESOPHAGOGASTRODUODENOSCOPY  July 2010   Dr. Arnoldo Morale: gastritis and duodenitis, H.pylori negative  . ESOPHAGOGASTRODUODENOSCOPY N/A 10/23/2014   RMR: Normal EGD. Status post passage of a Maloney dilator. Today's finding s would not explain abdominal pain  . EYE SURGERY Left 01/14/2020   cataract removal  . GANGLION CYST EXCISION Left 09/07/2018   Procedure: REMOVAL GANGLION OF WRIST;  Surgeon: Carole Civil, MD;  Location: [facility];  Service: Orthopedics;  Laterality: Left;  . HERNIA REPAIR     Dr. Arnoldo Morale  . KIDNEY STONE SURGERY    . MALONEY DILATION N/A 10/23/2014   Procedure: Venia Minks DILATION;  Surgeon: Daneil Dolin, MD;  Location: [facility];  Service: Endoscopy;  Laterality: N/A;  . OPEN REDUCTION INTERNAL FIXATION (ORIF) DISTAL RADIAL FRACTURE Right 12/09/2015   Procedure: OPEN REDUCTION INTERNAL FIXATION (ORIF) RIGHT DISTAL RADIUS;  Surgeon: Leanora Cover, MD;  Location: [facility];  Service: Orthopedics;  Laterality: Right;    Family History  Adopted: Yes    Social History   Socioeconomic History  . Marital status: Married    Spouse name: Not on file  . Number of children: Not on file  . Years of education: Not on file  . Highest education level: Not on file  Occupational History  . Occupation: disability  Tobacco Use  . Smoking status: Current Every Day Smoker    Packs/day: 1.50    Years: 51.00    Pack years: 76.50    Types: Cigarettes    Start date: 33  . Smokeless tobacco: Never Used  . Tobacco comment: 10/22/19- 12 cigs in a 24 hour  period  Vaping Use  . Vaping Use: Never used  Substance and Sexual Activity  . Alcohol use: Yes    Alcohol/week: 1.0 standard drink    Types: 1 Standard drinks or equivalent per week    Comment: quit 11/03/2014. used to drink couple of 40 ounces.  . Drug use: No  . Sexual activity: Yes    Birth control/protection: Post-menopausal  Other Topics Concern  . Not on file  Social History Narrative  . Not on file   Social Determinants of Health   Financial Resource Strain:   . Difficulty of Paying Living Expenses:   Food Insecurity:   . Worried About Charity fundraiser in the Last Year:   . Arboriculturist in the Last Year:   Transportation Needs:   . Film/video editor (Medical):   Marland Kitchen Lack of Transportation (Non-Medical):   Physical Activity:   . Days of Exercise per Week:   . Minutes of Exercise per Session:   Stress:   . Feeling of Stress :   Social Connections:   . Frequency of Communication with Friends and Family:   . Frequency of Social Gatherings with Friends and Family:   . Attends Religious Services:   . Active Member of Clubs or Organizations:   . Attends Archivist Meetings:   Marland Kitchen Marital Status:   Intimate Partner Violence:   . Fear of Current or Ex-Partner:   . Emotionally Abused:   Marland Kitchen Physically Abused:   . Sexually Abused:     No current facility-administered medications on file prior to encounter.   Current Outpatient Medications on File Prior to Encounter  Medication Sig Dispense Refill  . Accu-Chek FastClix Lancets MISC USE TO CHECK BLOODOGLUCOSE ONCE DAILY    . ACCU-CHEK GUIDE test strip USE TO CHECK BLOODEBLUCOSE ONCE DAILY.    Marland Kitchen albuterol-ipratropium (COMBIVENT) 18-103 MCG/ACT inhaler Inhale 2 puffs into the lungs 4 (four) times daily.     Marland Kitchen azithromycin (ZITHROMAX) 250 MG tablet Zpack taper as directed 6 tablet 0  . budesonide-formoterol (SYMBICORT) 160-4.5 MCG/ACT inhaler Inhale 2 puffs into the lungs in the morning and at bedtime. 1  Inhaler 5  . cetirizine (ZYRTEC) 10 MG tablet Take 10 mg by mouth daily as needed for allergies.     . cycloSPORINE (RESTASIS) 0.05 % ophthalmic emulsion Place 1 drop into both eyes 2 (two) times daily.    . diclofenac Sodium (VOLTAREN) 1 % GEL Apply 2 g topically 3 (three) times daily as needed (pain).     Marland Kitchen doxepin (SINEQUAN) 10 MG capsule Take 10 mg by mouth daily.    Marland Kitchen esomeprazole (NEXIUM) 20 MG capsule Take 20 mg by mouth in the morning.     . fluticasone (FLONASE) 50 MCG/ACT nasal spray Place 1 spray into both nostrils 2 (two) times daily as needed for allergies or rhinitis. 16 g 6  . gabapentin (NEURONTIN) 800 MG tablet Take 400 mg by mouth 3 (three) times daily as needed (pain).     Marland Kitchen HYDROcodone-acetaminophen (NORCO/VICODIN) 5-325 MG tablet Take 1 tablet by mouth 2 (two) times daily as needed for pain.    Marland Kitchen  ILEVRO 0.3 % ophthalmic suspension Place 1 drop into the left eye daily.    Marland Kitchen ipratropium-albuterol (DUONEB) 0.5-2.5 (3) MG/3ML SOLN Take 3 mLs by nebulization every 4 (four) hours as needed. 360 mL 5  . lisinopril (ZESTRIL) 30 MG tablet Take 30 mg by mouth daily.     . metFORMIN (GLUCOPHAGE) 1000 MG tablet Take 1,000 mg by mouth 2 (two) times daily.    Marland Kitchen moxifloxacin (VIGAMOX) 0.5 % ophthalmic solution Place 1 drop into the left eye 3 (three) times daily.    . nabumetone (RELAFEN) 750 MG tablet Take 750 mg by mouth daily as needed for moderate pain.     . OXYGEN Inhale 4.5 L into the lungs continuous.     . prednisoLONE acetate (PRED FORTE) 1 % ophthalmic suspension Place 1 drop into the left eye 3 (three) times daily.    Marland Kitchen rOPINIRole (REQUIP) 1 MG tablet Take 1 mg by mouth 3 (three) times daily.     . simvastatin (ZOCOR) 10 MG tablet Take 5 mg by mouth daily.    Marland Kitchen torsemide (DEMADEX) 20 MG tablet Take 1 tablet (20 mg total) by mouth daily as needed. May take an additional 20 mg daily as needed for extreme swelling. 180 tablet 3  . albuterol (PROVENTIL) (2.5 MG/3ML) 0.083% nebulizer  solution Take 3 mLs (2.5 mg total) by nebulization every 4 (four) hours as needed for wheezing or shortness of breath. 180 mL 2  . nitroGLYCERIN (NITROSTAT) 0.4 MG SL tablet DISSOLVE 1 TABLET UNDER THE TONGUE EVERY 5 MINUTES IF NEEDED FOR CHEST PAIN. MAX 3 DOSES THEN CALL 911. (Patient taking differently: Place 0.4 mg under the tongue every 5 (five) minutes as needed for chest pain. ) 25 tablet 3    Allergies  Allergen Reactions  . Penicillins Other (See Comments)    Patient is unsure if she allergic to penicillin or septra. Patient states one or another caused "rib pain with a little breathing problem". Has patient had a PCN reaction causing immediate rash, facial/tongue/throat swelling, SOB or lightheadedness with hypotension: YES Has patient had a PCN reaction causing severe rash involving mucus membranes or skin necrosis: NO Has patient had a PCN reaction that required hospitalization: NO Has patient had a PCN reaction occurring within the last 10 years: NO  . Septra [Sulfamethoxazole-Trimethoprim] Other (See Comments)    Patient is unsure if she allergic to septra or penicillin. Patient states one or another caused "rib pain with a little breathing problem".    REVIEW OF SYSTEMS:  Neurologic:  Denies vertigo, syncope, convulsions or  headaches.  Musculoskeletal:  No muscle or joint pain.  Cardiorespiratory:  + baseline dyspnea on exertion, no chest pain, cough or  hemoptysis.  Gastrointestinal:  Denies loose stool, Denies emesis, melena, constipationor rectal  bleeding. Genitourinary: No difficulty with voiding noted.   PHYSICAL EXAMINATION:  Today's Vitals   01/28/20 1121 01/28/20 1135  BP: (!) 119/47   Pulse: 86   Resp: 22   Temp: 98 F (36.7 C)   TempSrc: Oral   SpO2: 98%   Weight: (!) 98.4 kg   Height: 5\' 4"  (1.626 m)   PainSc:  0-No pain    GENERAL:  Well-developed, well-nourished, in no acute distress.  Alert  and cooperative.  LOWER EXTREMITY EXAM: Unchanged from  previous Dermatology: There is a raised fluctuant soft tissue mass at the dorsal first metatarsal phalangeal joint that measures less than 0.5 cm translucent consistent with cyst over the right joint like previous.  There is no warmth no erythema to this area.  Clinical pictures in chart.  Vascular: Dorsalis Pedis and Posterior Tibial pedal pulses faintly palpable, Capillary Fill Time 5 seconds,scant pedal hair growth bilateral, Temperature gradient within normal limits.  Neurology: Johney Maine sensation intact via light touch bilateral.  Musculoskeletal: Mild tenderness with palpation at cyst over the right big toe joint with significant limited range of motion and dorsal bone spurs consistent with hallux rigidus at the right greater than left big toe joint like previous. ASSESSMENTS:  Cyst Hallux rigidus Right 1st MTPJ pain   PLAN OF CARE: Patient seen and evaluated 1. History and physical completed 2. Patient to be NPO at midnight 3. Previous Imaging reviewed    4. Consent for surgery explained and obtained; risk and benefits explained; all questions answered and no guarantees granted. 5. Patient to undergo Right 1st MTPJ excision of cyst, chielectomy, and placement of implant at right 1st MTPJ surgical procedure 6. Case discussed with patient 7. Patient to resume all meds post op and discharge to home 8. Will continue to follow closely/ see post-operative in office as scheduled next week  Landis Martins, DPM [facility] [phone] office [phone] cell

## 2020-01-28 NOTE — Op Note (Signed)
DATE: 01-28-20  SURGEON: Landis Martins, DPM  PREOPERATIVE DIAGNOSIS:  Right hallux ridigus/arthritis and cyst at 1st MTPJ  POSTOPERATIVE DIAGNOSIS: Same   PROCEDURE PERFORMED: right 1st MTPJ chielectomy and placement of Swanson flex toe implant size 5 ([employer]) with excision of ganglion cyst at dorsal 1st MTPJ on right   HEMOSTASIS:  right ankle tourniquet     ESTIMATED BLOOD LOSS:  Minimal     ANESTHESIA:  MAC with local 10cc of 0.5% marcaine plain and anesthesia gave ankle block using lidocaine pre-op      SPECIMENS: Cyst-path   COMPLICATIONS:  None.     INDICATIONS FOR PROCEDURE:  This patient is a pleasant 64 y.o. Female who has been seen in office for recurrent cyst and 1st MTPJ pain on right.  Patient elects for cheilectomy vs implant and excision of cyst on right, Risks and complications include but are not limited to infection, recurrence of symptoms, pain, numbness, wound dehiscence, delayed healing, as well as need for future surgery.  No guarantees were given or applied.  All questions were answered to the patient's satisfaction, and the patient has consented to the above procedure.  All preoperative labs and H&P, medical clearances have been obtained and NPO status past midnight has been confirmed.    PREPARATION FOR PROCEDURE:  The patient was brought to the operating room and placed on the operating table in supine position.  A pneumatic ankle tourniquet was placed about the patient's right foot but not yet inflated.  After the department of anesthesia had administered MAC anesthesia. A local block was administered and the right foot was then scrubbed, prepped, and draped in the usual aseptic manner.  An Esmarch bandage was utilized to exsanguinate the patient's right foot and leg, and the pneumatic tourniquet was inflated to 250 mmHg.  PROCEDURE IN DETAIL:  Attention was then directed to the dorsal aspect of the patient's right foot where a linear  incision was made, extending distally, 1st toe in the routine fashion, The incision was carried deep down to the level of the first metatarsophalangeal joint where the hallux where there was a soft tissue mass/cyst noted extraarticular excised carefully using blunt dissection. Once removed sent to pathology,  Then the first metatarsophalangeal joint, capsule was reflected.  The first metatarsal head was inspected revealing severe arthritis and fragmented bone the head was resected at level of surgical neck and base of proximal phalanx was resected to reshape the joint in preparation for implant. [facility] medical broach and sizers were used and a size 5 implant was placed at 1st MTPJ on right with excellent position and range of motion noted on flouroscopy.  Also, bleeders were bovied as necessary. Skin closure was then obtained utilizing 2-0 vicryl and 3-0 monocryl and 3-0 nylon in combination of simple interrupted suture fashion to assist with wound closure.  The right foot was then dressed with Betadine soaked gauze overlying the suture sites, 4 x 4 gauze, Kerlix, Coban and ACE wrap.  At this time, the right pneumatic tourniquet was deflated, and a positive hyperemic response was noted to the digits. The patient tolerated the procedure and anesthesia well.  Upon transfer to the recovery room, the patient's vital signs were stable, and neurovascular status was intact.    Postoperative prescriptions and instructions were written and given to the patient who will return to the office of Dr. Cannon Kettle next week for continued care and management of this patient.  Landis Martins, DPM

## 2020-01-28 NOTE — Anesthesia Postprocedure Evaluation (Signed)
Anesthesia Post Note  Patient: [name]  Procedure(s) Performed: [name] IMPLANT RIGHT FOOT CHEILECTOMY RIGHT ROOT (Right )     Patient location during evaluation: PACU Anesthesia Type: MAC Level of consciousness: awake and alert Pain management: pain level controlled Vital Signs Assessment: post-procedure vital signs reviewed and stable Respiratory status: spontaneous breathing, nonlabored ventilation, respiratory function stable and patient connected to nasal cannula oxygen Cardiovascular status: stable and blood pressure returned to baseline Postop Assessment: no apparent nausea or vomiting Anesthetic complications: no   No complications documented.  Last Vitals:  Vitals:   01/28/20 1500 01/28/20 1515  BP: (!) 123/99 (!) 108/59  Pulse: 86 80  Resp: 22 18  Temp:  36.7 C  SpO2: 97% 97%    Last Pain:  Vitals:   01/28/20 1515  TempSrc:   PainSc: 0-No pain                 [name]

## 2020-01-28 NOTE — Brief Op Note (Signed)
01/28/2020  2:44 PM  PATIENT:  [name]  64 y.o. female  PRE-OPERATIVE DIAGNOSIS:  HALLUX RIGIDUS  POST-OPERATIVE DIAGNOSIS:  HALLUX RIGIDUS  PROCEDURE:  Procedure(s) with comments: KELLER BUNION IMPLANT RIGHT FOOT CHEILECTOMY RIGHT ROOT (Right) - BLOCK  SURGEON:  Surgeon(s) and Role:    Calen Posch, Training and development officer, DPM - Primary  PHYSICIAN ASSISTANT:   ASSISTANTS: None   ANESTHESIA:   local  EBL:  10 mL   BLOOD ADMINISTERED:none  DRAINS: none   LOCAL MEDICATIONS USED:  MARCAINE     SPECIMEN:  Source of Specimen:  Cyst Right 1st toe joint  DISPOSITION OF SPECIMEN:  PATHOLOGY  COUNTS:  YES  TOURNIQUET:   Total Tourniquet Time Documented: Leg (Right) - 77 minutes Total: Leg (Right) - 77 minutes   DICTATION: .Note written in EPIC  PLAN OF CARE: Discharge to home after PACU  PATIENT DISPOSITION:  PACU - hemodynamically stable.   Delay start of Pharmacological VTE agent (>24hrs) due to surgical blood loss or risk of bleeding: no

## 2020-01-28 NOTE — Transfer of Care (Signed)
Immediate Anesthesia Transfer of Care Note  Patient: [name]  Procedure(s) Performed: [name] IMPLANT RIGHT FOOT CHEILECTOMY RIGHT ROOT (Right )  Patient Location: PACU  Anesthesia Type:MAC combined with regional for post-op pain  Level of Consciousness: awake, alert  and oriented  Airway & Oxygen Therapy: Patient Spontanous Breathing and Patient connected to face mask oxygen  Post-op Assessment: Report given to RN, Post -op Vital signs reviewed and stable and Patient moving all extremities  Post vital signs: Reviewed and stable  Last Vitals:  Vitals Value Taken Time  BP 136/123 01/28/20 1437  Temp    Pulse 88 01/28/20 1439  Resp 25 01/28/20 1439  SpO2 97 % 01/28/20 1439  Vitals shown include unvalidated device data.  Last Pain:  Vitals:   01/28/20 1135  TempSrc:   PainSc: 0-No pain      Patients Stated Pain Goal: 5 (02/03/22 3612)  Complications: No complications documented.

## 2020-01-29 ENCOUNTER — Encounter (HOSPITAL_COMMUNITY)
Admit: 2020-01-29 | Discharge: 2020-01-29 | Disposition: A | Discharge status: Home / Self Care | Payer: Medicaid Other | Attending: Ophthalmology | Admitting: Ophthalmology

## 2020-01-29 ENCOUNTER — Telehealth: Payer: Self-pay | Admitting: *Deleted

## 2020-01-29 ENCOUNTER — Encounter (HOSPITAL_COMMUNITY): Payer: Self-pay | Admitting: Sports Medicine

## 2020-01-29 ENCOUNTER — Telehealth: Payer: Self-pay | Admitting: Sports Medicine

## 2020-01-29 LAB — SURGICAL PATHOLOGY

## 2020-01-29 NOTE — Telephone Encounter (Signed)
Pt called states she had surgery yesterday and wanted to know if Dr. Cannon Kettle would allow her to go to her eye doctor appt on Friday.

## 2020-01-29 NOTE — Telephone Encounter (Signed)
I spoke with pt and informed of Dr. Leeanne Rio orders and she states she is having eye surgery for cataracts. I told her I would need to check with Dr. Cannon Kettle to make sure it would be okay.

## 2020-01-29 NOTE — Telephone Encounter (Signed)
Patient can go have her eye surgery on Friday as long as her eye doctor is ok with her having the surgery since she just had foot surgery on Monday. Make sure she still tries to elevate her foot as much as possible to prevent increase in pain or swelling when she goes for surgery. -Thanks Dr. Chauncey Cruel

## 2020-01-29 NOTE — Telephone Encounter (Signed)
She can go to her eye doctor appointment on Friday. Its important to prop up her foot on the ride over and while she is waiting to see the doctor and then when she gets back home prop up the foot as well and ice for 50mins.   -Dr. Cannon Kettle

## 2020-01-29 NOTE — Telephone Encounter (Signed)
Postoperative check phone call made to patient.  Patient reports that she has some pain and was unable to sleep last night but otherwise her Percocet pain medicine seems to be helping some.  Patient denies any other symptoms at this time.  I encourage patient to continue with rest ice elevation and reassured patient if pain each day will slowly get better.  Advised patient to call office back if she has any other problems or concerns and reminded her of her appointment on next week. Dr. Cannon Kettle

## 2020-01-30 ENCOUNTER — Other Ambulatory Visit (HOSPITAL_COMMUNITY)
Admit: 2020-01-30 | Discharge: 2020-01-30 | Disposition: A | Discharge status: Home / Self Care | Payer: Medicaid Other | Attending: Ophthalmology | Admitting: Ophthalmology

## 2020-01-30 ENCOUNTER — Other Ambulatory Visit: Payer: Medicaid Other | Admitting: Adult Health

## 2020-01-30 NOTE — Progress Notes (Signed)
Spoke to patient, she said someone told her since she didn't had a covid screening for her first eye surgery. She wouldn't have to do it this time either. Nothing further needed.

## 2020-01-30 NOTE — Telephone Encounter (Signed)
Called patient and relayed the message per Dr Cannon Kettle. Lattie Haw

## 2020-01-31 ENCOUNTER — Telehealth: Payer: Self-pay | Admitting: Internal Medicine

## 2020-01-31 MED ORDER — PREDNISONE 20 MG PO TABS: 40.0000 mg | ORAL_TABLET | Freq: Every day | ORAL | 0 refills | Status: AC

## 2020-01-31 NOTE — Telephone Encounter (Signed)
Spoke with the pt and notified of recs per Dr Vaughan Browner  She verbalized understanding  Rx was sent to pharm

## 2020-01-31 NOTE — Telephone Encounter (Signed)
Send in another prednisone burst 40 mg daily for 5 days Will need to be seen in clinic if no improvement

## 2020-01-31 NOTE — Telephone Encounter (Signed)
Spoke with the pt  She states that she just finished pred taper 01/30/20 and has noticed increased SOB and wheezing over the past 2 days  She is still taking mucinex bid and is using her albuterol neb sol about 4 x per day  She had been out of her combivent inhaler for a few days, but just had this refilled last night  She has some cough, but no more than usual  No f/c/s, body aches  Last abx- zpack given 01/14/20 per Beth  No appts available rest of the wk unfortunately  Please advise, thanks!

## 2020-02-01 ENCOUNTER — Encounter (HOSPITAL_COMMUNITY): Payer: Self-pay | Admitting: Ophthalmology

## 2020-02-01 ENCOUNTER — Ambulatory Visit (HOSPITAL_COMMUNITY): Payer: Medicaid Other | Admitting: Anesthesiology

## 2020-02-01 ENCOUNTER — Other Ambulatory Visit: Payer: Self-pay

## 2020-02-01 ENCOUNTER — Encounter (HOSPITAL_COMMUNITY)
Admission: RE | Disposition: A | Discharge status: Home / Self Care | Payer: Self-pay | Source: Home / Self Care | Attending: Ophthalmology

## 2020-02-01 ENCOUNTER — Ambulatory Visit (HOSPITAL_COMMUNITY)
Admission: RE | Admit: 2020-02-01 | Discharge: 2020-02-01 | Disposition: A | Discharge status: Home / Self Care | Payer: Medicaid Other | Attending: Ophthalmology | Admitting: Ophthalmology

## 2020-02-01 DIAGNOSIS — Z79899 Other long term (current) drug therapy: Secondary | ICD-10-CM | POA: Diagnosis not present

## 2020-02-01 DIAGNOSIS — K219 Gastro-esophageal reflux disease without esophagitis: Secondary | ICD-10-CM | POA: Insufficient documentation

## 2020-02-01 DIAGNOSIS — F319 Bipolar disorder, unspecified: Secondary | ICD-10-CM | POA: Diagnosis not present

## 2020-02-01 DIAGNOSIS — Z7984 Long term (current) use of oral hypoglycemic drugs: Secondary | ICD-10-CM | POA: Insufficient documentation

## 2020-02-01 DIAGNOSIS — F17210 Nicotine dependence, cigarettes, uncomplicated: Secondary | ICD-10-CM | POA: Diagnosis not present

## 2020-02-01 DIAGNOSIS — E1136 Type 2 diabetes mellitus with diabetic cataract: Secondary | ICD-10-CM | POA: Insufficient documentation

## 2020-02-01 DIAGNOSIS — H25811 Combined forms of age-related cataract, right eye: Secondary | ICD-10-CM | POA: Diagnosis not present

## 2020-02-01 DIAGNOSIS — J449 Chronic obstructive pulmonary disease, unspecified: Secondary | ICD-10-CM | POA: Diagnosis not present

## 2020-02-01 DIAGNOSIS — G2581 Restless legs syndrome: Secondary | ICD-10-CM | POA: Diagnosis not present

## 2020-02-01 HISTORY — PX: CATARACT EXTRACTION W/PHACO: SHX586

## 2020-02-01 LAB — GLUCOSE, CAPILLARY: Glucose-Capillary: 115 mg/dL — ABNORMAL HIGH (ref 70–99)

## 2020-02-01 SURGERY — PHACOEMULSIFICATION, CATARACT, WITH IOL INSERTION
Anesthesia: Monitor Anesthesia Care | Site: Eye | Laterality: Right

## 2020-02-01 MED ORDER — FENTANYL CITRATE (PF) 100 MCG/2ML IJ SOLN
INTRAMUSCULAR | Status: DC | PRN
  Administered 2020-02-01: 50 ug via INTRAVENOUS

## 2020-02-01 MED ORDER — IPRATROPIUM-ALBUTEROL 0.5-2.5 (3) MG/3ML IN SOLN
RESPIRATORY_TRACT | Status: AC
  Filled 2020-02-01: qty 3

## 2020-02-01 MED ORDER — CYCLOPENTOLATE-PHENYLEPHRINE 0.2-1 % OP SOLN
1.0000 [drp] | OPHTHALMIC | Status: AC | PRN
  Administered 2020-02-01 (×3): 1 [drp] via OPHTHALMIC

## 2020-02-01 MED ORDER — MIDAZOLAM HCL 5 MG/5ML IJ SOLN
INTRAMUSCULAR | Status: DC | PRN
  Administered 2020-02-01: 2 mg via INTRAVENOUS

## 2020-02-01 MED ORDER — PHENYLEPHRINE-KETOROLAC 1-0.3 % IO SOLN
INTRAOCULAR | Status: DC | PRN
  Administered 2020-02-01: 500 mL via OPHTHALMIC

## 2020-02-01 MED ORDER — MIDAZOLAM HCL 2 MG/2ML IJ SOLN
INTRAMUSCULAR | Status: AC
  Filled 2020-02-01: qty 2

## 2020-02-01 MED ORDER — POVIDONE-IODINE 5 % OP SOLN
OPHTHALMIC | Status: DC | PRN
  Administered 2020-02-01: 1 via OPHTHALMIC

## 2020-02-01 MED ORDER — IPRATROPIUM-ALBUTEROL 0.5-2.5 (3) MG/3ML IN SOLN
3.0000 mL | Freq: Once | RESPIRATORY_TRACT | Status: AC
  Administered 2020-02-01: 3 mL via RESPIRATORY_TRACT

## 2020-02-01 MED ORDER — LIDOCAINE HCL (PF) 1 % IJ SOLN
INTRAOCULAR | Status: DC | PRN
  Administered 2020-02-01: 1 mL via OPHTHALMIC

## 2020-02-01 MED ORDER — LACTATED RINGERS IV SOLN: INTRAVENOUS | Status: DC

## 2020-02-01 MED ORDER — LIDOCAINE HCL 3.5 % OP GEL
1.0000 "application " | Freq: Once | OPHTHALMIC | Status: AC
  Administered 2020-02-01: 1 via OPHTHALMIC

## 2020-02-01 MED ORDER — BSS IO SOLN
INTRAOCULAR | Status: DC | PRN
  Administered 2020-02-01: 15 mL via INTRAOCULAR

## 2020-02-01 MED ORDER — FENTANYL CITRATE (PF) 100 MCG/2ML IJ SOLN
INTRAMUSCULAR | Status: AC
  Filled 2020-02-01: qty 2

## 2020-02-01 MED ORDER — TETRACAINE HCL 0.5 % OP SOLN
1.0000 [drp] | OPHTHALMIC | Status: AC | PRN
  Administered 2020-02-01 (×3): 1 [drp] via OPHTHALMIC

## 2020-02-01 MED ORDER — PHENYLEPHRINE-KETOROLAC 1-0.3 % IO SOLN
INTRAOCULAR | Status: AC
  Filled 2020-02-01: qty 4

## 2020-02-01 MED ORDER — PROVISC 10 MG/ML IO SOLN
INTRAOCULAR | Status: DC | PRN
  Administered 2020-02-01: 0.85 mL via INTRAOCULAR

## 2020-02-01 MED ORDER — SODIUM HYALURONATE 23 MG/ML IO SOLN
INTRAOCULAR | Status: DC | PRN
  Administered 2020-02-01: 0.6 mL via INTRAOCULAR

## 2020-02-01 MED ORDER — PHENYLEPHRINE HCL 2.5 % OP SOLN
1.0000 [drp] | OPHTHALMIC | Status: AC | PRN
  Administered 2020-02-01 (×3): 1 [drp] via OPHTHALMIC

## 2020-02-01 SURGICAL SUPPLY — 13 items
CLOTH BEACON ORANGE TIMEOUT ST (SAFETY) ×1 IMPLANT
EYE SHIELD UNIVERSAL CLEAR (GAUZE/BANDAGES/DRESSINGS) ×1 IMPLANT
GLOVE BIOGEL PI IND STRL 7.0 (GLOVE) IMPLANT
GLOVE BIOGEL PI INDICATOR 7.0 (GLOVE) ×2
LENS ALC ACRYL/TECN (Ophthalmic Related) ×1 IMPLANT
NDL HYPO 18GX1.5 BLUNT FILL (NEEDLE) IMPLANT
NEEDLE HYPO 18GX1.5 BLUNT FILL (NEEDLE) ×2 IMPLANT
PAD ARMBOARD 7.5X6 YLW CONV (MISCELLANEOUS) ×1 IMPLANT
SYR TB 1ML LL NO SAFETY (SYRINGE) ×1 IMPLANT
TAPE SURG TRANSPORE 1 IN (GAUZE/BANDAGES/DRESSINGS) IMPLANT
TAPE SURGICAL TRANSPORE 1 IN (GAUZE/BANDAGES/DRESSINGS) ×2
VISCOELASTIC ADDITIONAL (OPHTHALMIC RELATED) ×1 IMPLANT
WATER STERILE IRR 250ML POUR (IV SOLUTION) ×1 IMPLANT

## 2020-02-01 NOTE — Anesthesia Postprocedure Evaluation (Signed)
Anesthesia Post Note  Patient: [name]  Procedure(s) Performed: CATARACT EXTRACTION PHACO AND INTRAOCULAR LENS PLACEMENT RIGHT EYE (Right Eye)  Patient location during evaluation: Short Stay Anesthesia Type: MAC Level of consciousness: awake, oriented, awake and alert and patient cooperative Pain management: pain level controlled Vital Signs Assessment: post-procedure vital signs reviewed and stable Respiratory status: spontaneous breathing, respiratory function stable, nonlabored ventilation and patient connected to nasal cannula oxygen Cardiovascular status: blood pressure returned to baseline and stable Postop Assessment: no headache and no backache Anesthetic complications: no   No complications documented.   Last Vitals:  Vitals:   02/01/20 0803  BP: 97/72  Pulse: 80  Resp: 18  Temp: 36.7 C  SpO2: 100%    Last Pain:  Vitals:   02/01/20 0803  TempSrc: Oral  PainSc: 0-No pain                 Tacy Learn

## 2020-02-01 NOTE — Anesthesia Preprocedure Evaluation (Addendum)
Anesthesia Evaluation  Patient identified by MRN, date of birth, ID band Patient awake    Reviewed: Allergy & Precautions, NPO status , Patient's Chart, lab work & pertinent test results  History of Anesthesia Complications (+) POST - OP SPINAL HEADACHENegative for: history of anesthetic complications  Airway Mallampati: II  TM Distance: >3 FB Neck ROM: Full    Dental  (+) Dental Advisory Given, Upper Dentures   Pulmonary shortness of breath and with exertion, pneumonia, COPD,  COPD inhaler and oxygen dependent, Current Smoker and Patient abstained from smoking.,     + wheezing      Cardiovascular Exercise Tolerance: Poor hypertension, Pt. on medications +CHF   Rhythm:Regular Rate:Normal     Neuro/Psych  Headaches, PSYCHIATRIC DISORDERS Anxiety Depression Bipolar Disorder  Neuromuscular disease    GI/Hepatic hiatal hernia, GERD  Medicated,  Endo/Other  diabetes, Well Controlled, Type 2, Oral Hypoglycemic Agents  Renal/GU      Musculoskeletal  (+) Arthritis ,   Abdominal   Peds  Hematology  (+) anemia ,   Anesthesia Other Findings   Reproductive/Obstetrics                          Anesthesia Physical Anesthesia Plan  ASA: IV  Anesthesia Plan: MAC   Post-op Pain Management:    Induction:   PONV Risk Score and Plan:   Airway Management Planned: Nasal Cannula and Natural Airway  Additional Equipment:   Intra-op Plan:   Post-operative Plan:   Informed Consent: I have reviewed the patients History and Physical, chart, labs and discussed the procedure including the risks, benefits and alternatives for the proposed anesthesia with the patient or authorized representative who has indicated his/her understanding and acceptance.     Dental advisory given  Plan Discussed with: CRNA and Surgeon  Anesthesia Plan Comments:        Anesthesia Quick Evaluation

## 2020-02-01 NOTE — Op Note (Signed)
Date of procedure: 02/01/20  Pre-operative diagnosis:  Visually significant combined form age-related cataract, Right Eye (H25.811)  Post-operative diagnosis:  Visually significant combined form age-related cataract, Right Eye (H25.811)  Procedure: Removal of cataract via phacoemulsification and insertion of intra-ocular lens Wynetta Emery and [employer] DCB00  +23.5D into the capsular bag of the Right Eye  Attending surgeon: Gerda Diss. Prynce Jacober, MD, MA  Anesthesia: MAC, Topical [place]  Complications: None  Estimated Blood Loss: <26m (minimal)  Specimens: None  Implants: As above  Indications:  Visually significant age-related cataract, Right Eye  Procedure:  The patient was seen and identified in the pre-operative area. The operative eye was identified and dilated.  The operative eye was marked.  Topical anesthesia was administered to the operative eye.     The patient was then to the operative suite and placed in the supine position.  A timeout was performed confirming the patient, procedure to be performed, and all other relevant information.   The patient's face was prepped and draped in the usual fashion for intra-ocular surgery.  A lid speculum was placed into the operative eye and the surgical microscope moved into place and focused.  A superotemporal paracentesis was created using a 20 gauge paracentesis blade.  Shugarcaine was injected into the anterior chamber.  Viscoelastic was injected into the anterior chamber.  A temporal clear-corneal main wound incision was created using a 2.460mmicrokeratome.  A continuous curvilinear capsulorrhexis was initiated using an irrigating cystitome and completed using capsulorrhexis forceps.  Hydrodissection and hydrodeliniation were performed.  Viscoelastic was injected into the anterior chamber.  A phacoemulsification handpiece and a chopper as a second instrument were used to remove the nucleus and epinucleus. The irrigation/aspiration handpiece was  used to remove any remaining cortical material.   The capsular bag was reinflated with viscoelastic, checked, and found to be intact.  The intraocular lens was inserted into the capsular bag.  The irrigation/aspiration handpiece was used to remove any remaining viscoelastic.  The clear corneal wound and paracentesis wounds were then hydrated and checked with Weck-Cels to be watertight.  The lid-speculum was removed.  The drape was removed.  The patient's face was cleaned with a wet and dry 4x4. A clear shield was taped over the eye. The patient was taken to the post-operative care unit in good condition, having tolerated the procedure well.  Post-Op Instructions: [facility] and will receive all other orders and instructions.

## 2020-02-01 NOTE — Interval H&P Note (Signed)
History and Physical Interval Note:  02/01/2020 8:26 AM  [name]  has presented today for surgery, with the diagnosis of Nuclear sclerotic cataract - Right eye.  The various methods of treatment have been discussed with the patient and family. After consideration of risks, benefits and other options for treatment, the patient has consented to  Procedure(s) with comments: CATARACT EXTRACTION PHACO AND INTRAOCULAR LENS PLACEMENT (IOC) (Right) - CDE:  as a surgical intervention.  The patient's history has been reviewed, patient examined, no change in status, stable for surgery.  I have reviewed the patient's chart and labs.  Questions were answered to the patient's satisfaction.     Baruch Goldmann

## 2020-02-01 NOTE — Transfer of Care (Signed)
Immediate Anesthesia Transfer of Care Note  Patient: [name]  Procedure(s) Performed: CATARACT EXTRACTION PHACO AND INTRAOCULAR LENS PLACEMENT RIGHT EYE (Right Eye)  Patient Location: PACU and Short Stay  Anesthesia Type:MAC  Level of Consciousness: awake, alert , oriented and patient cooperative  Airway & Oxygen Therapy: Patient Spontanous Breathing and Patient connected to nasal cannula oxygen  Post-op Assessment: Report given to RN, Post -op Vital signs reviewed and stable and Patient moving all extremities  Post vital signs: Reviewed and stable  Last Vitals:  Vitals Value Taken Time  BP    Temp    Pulse    Resp    SpO2      Last Pain:  Vitals:   02/01/20 0803  TempSrc: Oral  PainSc: 0-No pain      Patients Stated Pain Goal: 5 (44/01/02 7253)  Complications: No complications documented.

## 2020-02-01 NOTE — Discharge Instructions (Signed)
Please discharge patient when stable, will follow up today with Dr. Athony Coppa at the [facility] office immediately following discharge.  Leave shield in place until visit.  All paperwork with discharge instructions will be given at the office.  [facility] Address:  [address]  [place], [place] [address]  

## 2020-02-04 ENCOUNTER — Telehealth: Payer: Self-pay | Admitting: *Deleted

## 2020-02-04 ENCOUNTER — Encounter (HOSPITAL_COMMUNITY): Payer: Self-pay | Admitting: Ophthalmology

## 2020-02-04 NOTE — Telephone Encounter (Signed)
Called and spoke with the patient on Thursday morning and relayed the message per Dr Cannon Kettle. [name]

## 2020-02-06 ENCOUNTER — Ambulatory Visit (HOSPITAL_COMMUNITY): Payer: Self-pay | Admitting: Physical Therapy

## 2020-02-07 ENCOUNTER — Telehealth: Payer: Self-pay | Admitting: Internal Medicine

## 2020-02-07 ENCOUNTER — Encounter: Payer: Self-pay | Admitting: Sports Medicine

## 2020-02-07 ENCOUNTER — Ambulatory Visit (INDEPENDENT_AMBULATORY_CARE_PROVIDER_SITE_OTHER): Payer: Medicaid Other | Admitting: Sports Medicine

## 2020-02-07 ENCOUNTER — Other Ambulatory Visit: Payer: Self-pay

## 2020-02-07 ENCOUNTER — Ambulatory Visit (INDEPENDENT_AMBULATORY_CARE_PROVIDER_SITE_OTHER): Payer: Medicaid Other

## 2020-02-07 DIAGNOSIS — Z9889 Other specified postprocedural states: Secondary | ICD-10-CM

## 2020-02-07 DIAGNOSIS — M2021 Hallux rigidus, right foot: Secondary | ICD-10-CM | POA: Diagnosis not present

## 2020-02-07 DIAGNOSIS — M79671 Pain in right foot: Secondary | ICD-10-CM

## 2020-02-07 DIAGNOSIS — M6740 Ganglion, unspecified site: Secondary | ICD-10-CM

## 2020-02-07 MED ORDER — OXYCODONE-ACETAMINOPHEN 10-325 MG PO TABS: 1.0000 | ORAL_TABLET | Freq: Three times a day (TID) | ORAL | 0 refills | Status: AC | PRN

## 2020-02-07 NOTE — Telephone Encounter (Signed)
Spoke with pt and informed her that Tammy wanted her to be evaluated. Unfortunately we do not have any appointment available. Pt was instructed to go to Urgent Care of ED for further evaluation. Pt stated understanding. Nothing further needed at this time.

## 2020-02-07 NOTE — Telephone Encounter (Signed)
Spoke with patient, she reports starting on prednisone on Tuesday of this week.  She states that the cough and wheezing is some improved, however, states sometimes when she coughs it feels like her breath is cut off.  Most of the time the cough is dry, at other times she gets up white phlem with a little yellow in it.  She is on 4.5 L of oxygen 24/7.  She was unable to tell me her oxygen sats as her pulse ox is broken.  She denies any fever, but states she does have some body aches in her arms.  She is taking Musinex.  She is wondering if she needs an antibiotic.  Please advise.  Thank you.

## 2020-02-07 NOTE — Telephone Encounter (Signed)
According to chart review patient has had multiple calls and antibiotics and prednisone is sounds like she is not getting better she needs an in person visit for evaluation.  Please see if she can be added to the schedule if no availability will need to go to the urgent care or emergency room for further evaluation  Please contact office for sooner follow up if symptoms do not improve or worsen or seek emergency care

## 2020-02-07 NOTE — Progress Notes (Signed)
Subjective: [name] is a 64 y.o. female patient seen today in office for POV #1 (DOS 01-28-20), S/P right excision of cyst and keller joint implant (wright medical flex toe). Patient admits some pain at surgical site, denies calf pain, denies headache, chest pain, shortness of breath, nausea, vomiting, fever, or chills. Reports that 1st toe feel itchy. Patient is assisted by husband. No other issues noted.   Patient Active Problem List   Diagnosis Date Noted  . Ganglion cyst of dorsum of left wrist s/p removal 09/07/18   . DM (diabetes mellitus), type 2 ([place]) 08/30/2017  . Respiratory bronchiolitis associated interstitial lung disease ([place]) 08/30/2017  . Hyperglycemia 08/26/2017  . Chronic pain 08/25/2017  . COPD with acute exacerbation ([place]) 08/25/2017  . Alcohol abuse 08/01/2015  . Anorgasmia of female 01/01/2015  . Low grade squamous intraepithelial lesion (LGSIL) on Papanicolaou smear of cervix 12/04/2014  . Abdominal pain, chronic, epigastric 11/20/2014  . Dysphagia, pharyngoesophageal phase   . Hx of colonic polyps   . COPD exacerbation ([place]) 07/05/2014  . GERD without esophagitis 07/05/2014  . Hypertension 07/05/2014  . Bipolar 1 disorder ([place]) 07/05/2014  . Anxiety 07/05/2014  . Polysubstance abuse ([place]) 07/05/2014  . Obesity (BMI 30-39.9) 07/05/2014  . Encephalopathy, metabolic 62/83/1517  . Personal history of colonic polyps 04/01/2014  . Mild dysplasia of cervix 11/13/2013  . Acute respiratory failure with hypoxia ([place]) 07/08/2013  . Benzodiazepine dependence ([place]) 08/26/2012  . COPD (chronic obstructive pulmonary disease) ([place]) 08/25/2012  . Panic disorder 08/25/2012  . Tobacco use disorder 08/25/2012  . Hyperlipidemia 08/25/2012    Current Outpatient Medications on File Prior to Visit  Medication Sig Dispense Refill  . Accu-Chek FastClix Lancets MISC USE TO CHECK BLOODOGLUCOSE ONCE DAILY    . ACCU-CHEK GUIDE test strip USE TO CHECK BLOODEBLUCOSE ONCE  DAILY.    Marland Kitchen albuterol (PROVENTIL) (2.5 MG/3ML) 0.083% nebulizer solution Take 3 mLs (2.5 mg total) by nebulization every 4 (four) hours as needed for wheezing or shortness of breath. 180 mL 2  . albuterol-ipratropium (COMBIVENT) 18-103 MCG/ACT inhaler Inhale 2 puffs into the lungs 4 (four) times daily.     Marland Kitchen azithromycin (ZITHROMAX) 250 MG tablet Zpack taper as directed 6 tablet 0  . budesonide-formoterol (SYMBICORT) 160-4.5 MCG/ACT inhaler Inhale 2 puffs into the lungs in the morning and at bedtime. 1 Inhaler 5  . cetirizine (ZYRTEC) 10 MG tablet Take 10 mg by mouth daily as needed for allergies.     . COMBIVENT RESPIMAT 20-100 MCG/ACT AERS respimat INHALE 1 PUFF INTO LUNGS EVERY 4 HOURS. 4 g 3  . cycloSPORINE (RESTASIS) 0.05 % ophthalmic emulsion Place 1 drop into both eyes 2 (two) times daily.    . diclofenac Sodium (VOLTAREN) 1 % GEL Apply 2 g topically 3 (three) times daily as needed (pain).     Marland Kitchen docusate sodium (COLACE) 100 MG capsule Take 1 capsule (100 mg total) by mouth daily as needed. 30 capsule 2  . doxepin (SINEQUAN) 10 MG capsule Take 10 mg by mouth daily.    Marland Kitchen esomeprazole (NEXIUM) 20 MG capsule Take 20 mg by mouth in the morning.     . fluticasone (FLONASE) 50 MCG/ACT nasal spray Place 1 spray into both nostrils 2 (two) times daily as needed for allergies or rhinitis. 16 g 6  . gabapentin (NEURONTIN) 800 MG tablet Take 400 mg by mouth 3 (three) times daily as needed (pain).     Marland Kitchen HYDROcodone-acetaminophen (NORCO/VICODIN) 5-325 MG tablet Take 1 tablet  by mouth 2 (two) times daily as needed.    . ILEVRO 0.3 % ophthalmic suspension Place 1 drop into the left eye daily.    Marland Kitchen ipratropium-albuterol (DUONEB) 0.5-2.5 (3) MG/3ML SOLN Take 3 mLs by nebulization every 4 (four) hours as needed. 360 mL 5  . lisinopril (ZESTRIL) 30 MG tablet Take 30 mg by mouth daily.     . metFORMIN (GLUCOPHAGE) 1000 MG tablet Take 1,000 mg by mouth 2 (two) times daily.    Marland Kitchen moxifloxacin (VIGAMOX) 0.5 %  ophthalmic solution Place 1 drop into the left eye 3 (three) times daily.    . nabumetone (RELAFEN) 750 MG tablet Take 750 mg by mouth daily as needed for moderate pain.     . nitroGLYCERIN (NITROSTAT) 0.4 MG SL tablet DISSOLVE 1 TABLET UNDER THE TONGUE EVERY 5 MINUTES IF NEEDED FOR CHEST PAIN. MAX 3 DOSES THEN CALL 911. (Patient taking differently: Place 0.4 mg under the tongue every 5 (five) minutes as needed for chest pain. ) 25 tablet 3  . OXYGEN Inhale 4.5 L into the lungs continuous.     . prednisoLONE acetate (PRED FORTE) 1 % ophthalmic suspension Place 1 drop into the left eye 3 (three) times daily.    . predniSONE (DELTASONE) 10 MG tablet 40mg X2 days, 30mg  X2 days, 20mg  X2 days, 10mg X2 days, then stop. 20 tablet 0  . promethazine (PHENERGAN) 12.5 MG tablet Take 1 tablet (12.5 mg total) by mouth every 6 (six) hours as needed for nausea or vomiting. 30 tablet 0  . rOPINIRole (REQUIP) 1 MG tablet Take 1 mg by mouth 3 (three) times daily.     . simvastatin (ZOCOR) 10 MG tablet Take 5 mg by mouth daily.    . SUMAtriptan (IMITREX) 25 MG tablet SMARTSIG:1-2 Tablet(s) By Mouth PRN    . torsemide (DEMADEX) 20 MG tablet Take 1 tablet (20 mg total) by mouth daily as needed. May take an additional 20 mg daily as needed for extreme swelling. 180 tablet 3   No current facility-administered medications on file prior to visit.    Allergies  Allergen Reactions  . Penicillins Other (See Comments)    Patient is unsure if she allergic to penicillin or septra. Patient states one or another caused "rib pain with a little breathing problem". Has patient had a PCN reaction causing immediate rash, facial/tongue/throat swelling, SOB or lightheadedness with hypotension: YES Has patient had a PCN reaction causing severe rash involving mucus membranes or skin necrosis: NO Has patient had a PCN reaction that required hospitalization: NO Has patient had a PCN reaction occurring within the last 10 years: NO  .  Septra [Sulfamethoxazole-Trimethoprim] Other (See Comments)    Patient is unsure if she allergic to septra or penicillin. Patient states one or another caused "rib pain with a little breathing problem".    Objective: There were no vitals filed for this visit.  General: No acute distress, AAOx3 on 02  Right foot: Sutures intact with no gapping or dehiscence at surgical site, mild swelling to right foot, dry blood blister at 1st toe and lesser toe ecchymosis, no erythema, no warmth, no drainage, no acute signs of infection noted, Capillary fill time <5 seconds in all digits, gross sensation present via light touch to right foot. Guarded range of motion at 1st toe, No pain with calf compression.   Post Op Xray, Right foot, implant intact at 1st toe joint, Soft tissue swelling within normal limits for post op status.   Assessment and Plan:  Problem List Items Addressed This Visit    None    Visit Diagnoses    Hallux rigidus of right foot    -  Primary   Relevant Orders   DG Foot Complete Right   Ganglion       Right foot pain       S/P foot surgery, right       Relevant Medications   oxyCODONE-acetaminophen (PERCOCET) 10-325 MG tablet       -Patient seen and evaluated -Xrays and path reviewed  -Applied dry sterile dressing to surgical site right foot secured with ACE wrap and stockinet  -Advised patient to make sure to keep dressings clean, dry, and intact to right surgical site, removing the ACE as needed  -Advised patient to continue with post-op shoe on right with cane and limited weightbearing  -Advised patient to ice and elevate as instructed  -Refilled Percocet for pain  -Will plan for possible suture removal at next office visit. In the meantime, patient to call office if any issues or problems arise.   Landis Martins, DPM

## 2020-02-13 ENCOUNTER — Telehealth: Payer: Self-pay | Admitting: Internal Medicine

## 2020-02-13 ENCOUNTER — Other Ambulatory Visit: Payer: Self-pay | Admitting: Primary Care

## 2020-02-13 MED ORDER — AZITHROMYCIN 250 MG PO TABS: ORAL_TABLET | ORAL | 0 refills | Status: DC

## 2020-02-13 NOTE — Telephone Encounter (Signed)
Patient was last on Azithromycin on 01/14/20. She has severe tracheomalacia. I am ok sending in refill. Keep follow-up with Dr. Shearon Stalls, may consider daily or rotating abx therapy.  If she does not have flutter valve recommend getting her one to help with airway clearance. Also make sure she is taking Nexium as prescribed in the morning on empty stomach.

## 2020-02-13 NOTE — Telephone Encounter (Signed)
Called and spoke with pt letting her know the info stated by Beth and she verbalized understanding. Nothing further needed. 

## 2020-02-13 NOTE — Telephone Encounter (Signed)
Pt is requesting refill on zpack 250 mg has appt 03/17/20 with [name]

## 2020-02-13 NOTE — Telephone Encounter (Signed)
Called and spoke with pt who stated she began having complaints of increased SOB and a cough with mucus production x3 days now. Pt states the phlegm is yellow in color. Pt also has complaints of mild wheeze.  Pt denies any complaints of fever. Pt has had to  Use her nebulizer at least 4-5 times daily due to her breathing. Pt is still using 4.5L O2 daily 24/7. Pt is also using her combivent inhaler every 4 hours and states she might have to use it an extra time.  Pt wants to know if there was anything that could be prescribed to her as she is hoping to prevent from getting pneumonia. Beth, please advise.

## 2020-02-14 ENCOUNTER — Other Ambulatory Visit: Payer: Self-pay

## 2020-02-14 ENCOUNTER — Encounter: Payer: Self-pay | Admitting: Sports Medicine

## 2020-02-14 ENCOUNTER — Ambulatory Visit (INDEPENDENT_AMBULATORY_CARE_PROVIDER_SITE_OTHER): Payer: Medicaid Other | Admitting: Sports Medicine

## 2020-02-14 DIAGNOSIS — M6740 Ganglion, unspecified site: Secondary | ICD-10-CM

## 2020-02-14 DIAGNOSIS — M79671 Pain in right foot: Secondary | ICD-10-CM

## 2020-02-14 DIAGNOSIS — Z9889 Other specified postprocedural states: Secondary | ICD-10-CM

## 2020-02-14 DIAGNOSIS — M2021 Hallux rigidus, right foot: Secondary | ICD-10-CM

## 2020-02-14 MED ORDER — OXYCODONE-ACETAMINOPHEN 10-325 MG PO TABS: 1.0000 | ORAL_TABLET | Freq: Three times a day (TID) | ORAL | 0 refills | Status: DC | PRN

## 2020-02-14 NOTE — Progress Notes (Signed)
Subjective: [name] is a 64 y.o. female patient seen today in office for POV # 2 (DOS 01-28-20), S/P right excision of cyst and keller joint implant (wright medical flex toe). Patient admits some pain at surgical sit and swelling, denies calf pain, denies headache, chest pain, shortness of breath, nausea, vomiting, fever, or chills. No other issues noted.   Patient Active Problem List   Diagnosis Date Noted  . Ganglion cyst of dorsum of left wrist s/p removal 09/07/18   . DM (diabetes mellitus), type 2 ([place]) 08/30/2017  . Respiratory bronchiolitis associated interstitial lung disease ([place]) 08/30/2017  . Hyperglycemia 08/26/2017  . Chronic pain 08/25/2017  . COPD with acute exacerbation ([place]) 08/25/2017  . Alcohol abuse 08/01/2015  . Anorgasmia of female 01/01/2015  . Low grade squamous intraepithelial lesion (LGSIL) on Papanicolaou smear of cervix 12/04/2014  . Abdominal pain, chronic, epigastric 11/20/2014  . Dysphagia, pharyngoesophageal phase   . Hx of colonic polyps   . COPD exacerbation ([place]) 07/05/2014  . GERD without esophagitis 07/05/2014  . Hypertension 07/05/2014  . Bipolar 1 disorder ([place]) 07/05/2014  . Anxiety 07/05/2014  . Polysubstance abuse ([place]) 07/05/2014  . Obesity (BMI 30-39.9) 07/05/2014  . Encephalopathy, metabolic 05/01/2535  . Personal history of colonic polyps 04/01/2014  . Mild dysplasia of cervix 11/13/2013  . Acute respiratory failure with hypoxia ([place]) 07/08/2013  . Benzodiazepine dependence ([place]) 08/26/2012  . COPD (chronic obstructive pulmonary disease) ([place]) 08/25/2012  . Panic disorder 08/25/2012  . Tobacco use disorder 08/25/2012  . Hyperlipidemia 08/25/2012    Current Outpatient Medications on File Prior to Visit  Medication Sig Dispense Refill  . Accu-Chek FastClix Lancets MISC USE TO CHECK BLOODOGLUCOSE ONCE DAILY    . ACCU-CHEK GUIDE test strip USE TO CHECK BLOODEBLUCOSE ONCE DAILY.    Marland Kitchen albuterol (PROVENTIL) (2.5 MG/3ML) 0.083%  nebulizer solution Take 3 mLs (2.5 mg total) by nebulization every 4 (four) hours as needed for wheezing or shortness of breath. 180 mL 2  . albuterol-ipratropium (COMBIVENT) 18-103 MCG/ACT inhaler Inhale 2 puffs into the lungs 4 (four) times daily.     Marland Kitchen azithromycin (ZITHROMAX) 250 MG tablet Zpack taper as directed 6 tablet 0  . budesonide-formoterol (SYMBICORT) 160-4.5 MCG/ACT inhaler Inhale 2 puffs into the lungs in the morning and at bedtime. 1 Inhaler 5  . cetirizine (ZYRTEC) 10 MG tablet Take 10 mg by mouth daily as needed for allergies.     . COMBIVENT RESPIMAT 20-100 MCG/ACT AERS respimat INHALE 1 PUFF INTO LUNGS EVERY 4 HOURS. 4 g 3  . cycloSPORINE (RESTASIS) 0.05 % ophthalmic emulsion Place 1 drop into both eyes 2 (two) times daily.    . diclofenac Sodium (VOLTAREN) 1 % GEL Apply 2 g topically 3 (three) times daily as needed (pain).     Marland Kitchen docusate sodium (COLACE) 100 MG capsule Take 1 capsule (100 mg total) by mouth daily as needed. 30 capsule 2  . doxepin (SINEQUAN) 10 MG capsule Take 10 mg by mouth daily.    Marland Kitchen esomeprazole (NEXIUM) 20 MG capsule Take 20 mg by mouth in the morning.     . fluticasone (FLONASE) 50 MCG/ACT nasal spray Place 1 spray into both nostrils 2 (two) times daily as needed for allergies or rhinitis. 16 g 6  . gabapentin (NEURONTIN) 800 MG tablet Take 400 mg by mouth 3 (three) times daily as needed (pain).     Marland Kitchen HYDROcodone-acetaminophen (NORCO/VICODIN) 5-325 MG tablet Take 1 tablet by mouth 2 (two) times daily as needed.    Marland Kitchen  ILEVRO 0.3 % ophthalmic suspension Place 1 drop into the left eye daily.    Marland Kitchen ipratropium-albuterol (DUONEB) 0.5-2.5 (3) MG/3ML SOLN Take 3 mLs by nebulization every 4 (four) hours as needed. 360 mL 5  . lisinopril (ZESTRIL) 30 MG tablet Take 30 mg by mouth daily.     . metFORMIN (GLUCOPHAGE) 1000 MG tablet Take 1,000 mg by mouth 2 (two) times daily.    Marland Kitchen moxifloxacin (VIGAMOX) 0.5 % ophthalmic solution Place 1 drop into the left eye 3 (three)  times daily.    . nabumetone (RELAFEN) 750 MG tablet Take 750 mg by mouth daily as needed for moderate pain.     . nitroGLYCERIN (NITROSTAT) 0.4 MG SL tablet DISSOLVE 1 TABLET UNDER THE TONGUE EVERY 5 MINUTES IF NEEDED FOR CHEST PAIN. MAX 3 DOSES THEN CALL 911. (Patient taking differently: Place 0.4 mg under the tongue every 5 (five) minutes as needed for chest pain. ) 25 tablet 3  . OXYGEN Inhale 4.5 L into the lungs continuous.     . prednisoLONE acetate (PRED FORTE) 1 % ophthalmic suspension Place 1 drop into the left eye 3 (three) times daily.    . promethazine (PHENERGAN) 12.5 MG tablet Take 1 tablet (12.5 mg total) by mouth every 6 (six) hours as needed for nausea or vomiting. 30 tablet 0  . rOPINIRole (REQUIP) 1 MG tablet Take 1 mg by mouth 3 (three) times daily.     . simvastatin (ZOCOR) 10 MG tablet Take 5 mg by mouth daily.    . SUMAtriptan (IMITREX) 25 MG tablet SMARTSIG:1-2 Tablet(s) By Mouth PRN    . torsemide (DEMADEX) 20 MG tablet Take 1 tablet (20 mg total) by mouth daily as needed. May take an additional 20 mg daily as needed for extreme swelling. 180 tablet 3   No current facility-administered medications on file prior to visit.    Allergies  Allergen Reactions  . Penicillins Other (See Comments)    Patient is unsure if she allergic to penicillin or septra. Patient states one or another caused "rib pain with a little breathing problem". Has patient had a PCN reaction causing immediate rash, facial/tongue/throat swelling, SOB or lightheadedness with hypotension: YES Has patient had a PCN reaction causing severe rash involving mucus membranes or skin necrosis: NO Has patient had a PCN reaction that required hospitalization: NO Has patient had a PCN reaction occurring within the last 10 years: NO  . Septra [Sulfamethoxazole-Trimethoprim] Other (See Comments)    Patient is unsure if she allergic to septra or penicillin. Patient states one or another caused "rib pain with a little  breathing problem".    Objective: There were no vitals filed for this visit.  General: No acute distress, AAOx3 on 02  Right foot: Sutures intact with no gapping or dehiscence at surgical site, mild swelling to right foot, dry blood blister at 1st toe and lesser toe ecchymosis, no erythema, no warmth, no drainage, no acute signs of infection noted, Capillary fill time <5 seconds in all digits, gross sensation present via light touch to right foot. Guarded range of motion at 1st toe, No pain with calf compression.   Assessment and Plan:  Problem List Items Addressed This Visit    None    Visit Diagnoses    Hallux rigidus of right foot    -  Primary   Ganglion       Right foot pain       S/P foot surgery, right           -  Patient seen and evaluated -Sutures removed -Applied dry sterile dressing to surgical site right foot secured with ACE wrap and stockinet  -Advised patient may shower next week and redress with ACE wrap for edema control -Advised patient to continue with post-op shoe on right with cane and limited weightbearing  -Advised patient to ice and elevate as previous -Refilled Percocet for pain  -Will plan for xrays at next office visit. In the meantime, patient to call office if any issues or problems arise.   Landis Martins, DPM

## 2020-02-20 ENCOUNTER — Other Ambulatory Visit: Payer: Self-pay | Admitting: Sports Medicine

## 2020-02-21 ENCOUNTER — Telehealth: Payer: Self-pay | Admitting: Sports Medicine

## 2020-02-21 MED ORDER — HYDROCODONE-ACETAMINOPHEN 5-325 MG PO TABS: 1.0000 | ORAL_TABLET | Freq: Two times a day (BID) | ORAL | 0 refills | Status: DC | PRN

## 2020-02-21 NOTE — Addendum Note (Signed)
Addended by: Boneta Lucks on: 02/21/2020 03:16 PM   Modules accepted: Orders

## 2020-02-21 NOTE — Telephone Encounter (Signed)
Pt called requesting refill of pain medication.  Please send to [facility] in [place]

## 2020-02-21 NOTE — Telephone Encounter (Signed)
done

## 2020-02-25 ENCOUNTER — Other Ambulatory Visit: Payer: Medicaid Other | Admitting: Adult Health

## 2020-02-25 NOTE — Telephone Encounter (Signed)
Dr. Cannon Kettle,   A request for oxycodone for this pt was requested. Please advise if you would like to refill this prescription.Thank you.

## 2020-02-26 ENCOUNTER — Other Ambulatory Visit: Payer: Self-pay | Admitting: Sports Medicine

## 2020-02-26 ENCOUNTER — Telehealth: Payer: Self-pay | Admitting: Sports Medicine

## 2020-02-26 NOTE — Telephone Encounter (Signed)
Called patient to r/s appointment for 02/28/2020. She stated she will need some pain meds sent to [facility]. Please give patient a call.

## 2020-02-26 NOTE — Telephone Encounter (Signed)
Pain medication was refilled already this morning

## 2020-02-26 NOTE — Progress Notes (Signed)
Refilled pain medication already

## 2020-02-28 ENCOUNTER — Encounter: Payer: Self-pay | Admitting: Sports Medicine

## 2020-03-04 ENCOUNTER — Telehealth: Payer: Self-pay | Admitting: Internal Medicine

## 2020-03-04 NOTE — Telephone Encounter (Signed)
As documented below, patient received first covid vaccine on 02/22/20.  She has not received second vaccine yet.  Patient is afebrile but has also been taking Tylenol regularly, last dose X3 days ago.  No known covid exposures but pt states she cannot be certain she hasn't been exposed.    Next available appt is with an APP on 9/2.  Beth please advise if ok to schedule.  Thanks

## 2020-03-04 NOTE — Telephone Encounter (Signed)
Primary Pulmonologist: Shearon Stalls Last office visit and with whom: 12/24/19 with Beth What do we see them for (pulmonary problems): tracheomalacia Last OV assessment/plan: Assessment and Plan:  Severe tracheomalacia/  COPD: - Maximized treatment of underlying COPD and GERD symptoms and treat any exacerbations conservatively - Continue Symbicort 160 two puffs BID + Combivent/duoneb prn  - No evidence of obstruction on in-lab sleep study, may consider Trilogy ventilator in the future if needed  - Strongly encourage patient quit smoking  GERD: -Continue Nexium 20 mg daily in the morning - Adding over-the-counter Pepcid 20 mg take at bedtime  Nocturnal hypoxemia: - The mean oxygen saturation was 91.5%. The minimum SpO2 during sleep was 75.0%. She required the use of 2 liters oxygen during this study.  Follow Up Instructions:  - FU in 3 months with Dr. Shearon Stalls  I discussed the assessment and treatment plan with the patient. The patient was provided an opportunity to ask questions and all were answered. The patient agreed with the plan and demonstrated an understanding of the instructions.  The patient was advised to call back or seek an in-person evaluation if the symptoms worsen or if the condition fails to improve as anticipated.  I provided 18 minutes of non-face-to-face time during this encounter.   Martyn Ehrich, NP  Was appointment offered to patient (explain)?  Pt wants recommendations and meds to be prescribed   Reason for call: Called and spoke with pt. Pt stated she began having symptoms about 3-4 days ago including wheezing, coughing up yellow phlegm, and has had increased  SOB. Pt stated symptoms are worse during the day.  Pt has had to use rescue inhaler at least twice daily and has had to use her nebulizer at least twice daily to see if she could get some relief.  Pt has not been running any temp as last temp was 98.1  Pt said she did take some tylenol about  three days ago. Pt did receive her first covid vaccine 02/22/20.  Pt wants to know what we recommend to help with her symptoms and is asking if abx could be prescribed. Beth, please advise.  (examples of things to ask: : When did symptoms start? Fever? Cough? Productive? Color to sputum? More sputum than usual? Wheezing? Have you needed increased oxygen? Are you taking your respiratory medications? What over the counter measures have you tried?)  Allergies  Allergen Reactions  . Penicillins Other (See Comments)    Patient is unsure if she allergic to penicillin or septra. Patient states one or another caused "rib pain with a little breathing problem". Has patient had a PCN reaction causing immediate rash, facial/tongue/throat swelling, SOB or lightheadedness with hypotension: YES Has patient had a PCN reaction causing severe rash involving mucus membranes or skin necrosis: NO Has patient had a PCN reaction that required hospitalization: NO Has patient had a PCN reaction occurring within the last 10 years: NO  . Septra [Sulfamethoxazole-Trimethoprim] Other (See Comments)    Patient is unsure if she allergic to septra or penicillin. Patient states one or another caused "rib pain with a little breathing problem".    Immunization History  Administered Date(s) Administered  . Influenza Whole 03/29/2019  . Influenza-Unspecified 03/06/2019  . Pneumococcal Polysaccharide-23 07/06/2014  . Tdap 11/30/2013

## 2020-03-04 NOTE — Telephone Encounter (Signed)
Needs visit with first available. If she has been vaccinated, afebrile, no known covid exposure can be in office.

## 2020-03-05 ENCOUNTER — Other Ambulatory Visit: Payer: Self-pay | Admitting: Sports Medicine

## 2020-03-05 ENCOUNTER — Telehealth: Payer: Self-pay | Admitting: Podiatry

## 2020-03-05 MED ORDER — OXYCODONE-ACETAMINOPHEN 10-325 MG PO TABS: ORAL_TABLET | ORAL | 0 refills | Status: DC

## 2020-03-05 NOTE — Telephone Encounter (Signed)
Dr. Stover please advise 

## 2020-03-05 NOTE — Telephone Encounter (Signed)
ATC Patient.  LM to call back. 

## 2020-03-05 NOTE — Telephone Encounter (Signed)
I would make televisit

## 2020-03-05 NOTE — Telephone Encounter (Signed)
Refilled pain meds 

## 2020-03-05 NOTE — Telephone Encounter (Signed)
ATC Patient left a voicemail to call our office back.

## 2020-03-05 NOTE — Progress Notes (Signed)
Refilled pain meds 

## 2020-03-05 NOTE — Telephone Encounter (Signed)
I spoke with the pt and notified of response per [facility]  Appt for televisit was scheduled for 03/06/20 at 9:30 am

## 2020-03-05 NOTE — Telephone Encounter (Signed)
Patient called requesting refill on pain medication.

## 2020-03-05 NOTE — Telephone Encounter (Signed)
Patient is returning phone call. Patient phone number is [phone].

## 2020-03-06 ENCOUNTER — Other Ambulatory Visit: Payer: Self-pay

## 2020-03-06 ENCOUNTER — Encounter: Payer: Medicaid Other | Admitting: Primary Care

## 2020-03-06 NOTE — Progress Notes (Signed)
 Virtual Visit via Telephone Note  I connected with [name] on 03/06/20 at  9:30 AM EDT by telephone and verified that I am speaking with the correct person using two identifiers.  Location: Patient: Home Provider: Office   I discussed the limitations, risks, security and privacy concerns of performing an evaluation and management service by telephone and the availability of in person appointments. I also discussed with the patient that there may be a patient responsible charge related to this service. The patient expressed understanding and agreed to proceed.   History of Present Illness: 64 year old female, current every day smoker. PMH significant for COPD (FEV1 1.46L/ 51%), severe tacheomalacia, respiratory failure (oxygen  dependent), respiratory bronchiolitis associated ILD, GERD, DM, metabolic encephalopathy, polysubstance abuse, bipolar 1 disorder, benzodiazepine dependence. Patient of Dr. Meade, seen on 08/29/19 for consult. Maintained on Symbicort  160, as needed combivent /duonebs.    Previous [facility] pulmonary encounter: 10/08/2019 She had a episode today where she reports chocking on her mucus where she was gasping for her breath. She was able to recover after a few minutes and using duoneb breathing treatment. She wears oxygen  at night. Uses nebulizer four times a day. Takes mucinex  twice a day and nexium daily. She has not been using her flutter valve. She does not have a oximeter to check her oxygen  level. She is on 4.5L oxygen .     10/22/2019 Patient contacted today for follow-up. She did not fill azithromycin  prescription initially d/t cost. She has one day left and feels it has helped her congestion a lot. She has also been taking mucinex  twice a day. She avoids going outside d/t pollen. She takes zytrec daily and is compliant with nexium 20mg . She hasn't started chantix  d/t hx anxiety and nightmares. She reports loud snoring at night. She has not tried wellbutrin  but is  interested in a medication option to help her quit smoking. She is trying to cut back use. Consider repeating PFTs  11/09/2019 Patient contacted today for acute visit. She called earlier this week with reports of productive cough with associated shortness of breath and wheezing. Send in prescription for doxycyline. No improvement. Nasal congestion has loosed up some today. She is taking her Symbicort  two puffs twice a day. Uses albuterol  nebulizer 3-4 times a day. She had in-lab sleep study last night, results not available. She has questions about getting covid vaccine.   12/24/2019 Patient contacted today for a follow-up televisit to review sleep study results/ hx tracheomalacia. She is actually doing pretty well. No recent exacerbations. She has a chronic cough, clear mucus yesterday. She continues taking Symbicort  160 two puffs twice daily. She wears 4.5L oxygen  daily. She feels she may be getting too much oxygen  and sometimes turns it down and this makes her feel better. She has not bought a pulse oximeter, encouraged her to do so to monitor her O2 levels. She continues to smoke.   03/06/2020- interim hx Patient had had several recent exacerbations. She has received her first covid vaccine but not her second.       Observations/Objective:   Assessment and Plan:  Severe Tracheomalacia with acute exacerbtion: Goal is to maximize treatment of underlying GERD and COPD and treat any exacerbations conservatively.  RX  COPD: Continue Symbicort  160 two puffs twice daily and Combivent /Duoneb prn  GERD: Continue Nexium + Pepcid   Follow Up Instructions:    I discussed the assessment and treatment plan with the patient. The patient was provided an opportunity to ask questions and  all were answered. The patient agreed with the plan and demonstrated an understanding of the instructions.   The patient was advised to call back or seek an in-person evaluation if the symptoms worsen or if the  condition fails to improve as anticipated.    Rebekah LELON Ferrari, NP

## 2020-03-07 ENCOUNTER — Ambulatory Visit (INDEPENDENT_AMBULATORY_CARE_PROVIDER_SITE_OTHER): Payer: Medicaid Other | Admitting: Primary Care

## 2020-03-07 ENCOUNTER — Other Ambulatory Visit: Payer: Self-pay

## 2020-03-07 ENCOUNTER — Telehealth: Payer: Self-pay | Admitting: Internal Medicine

## 2020-03-07 DIAGNOSIS — J9611 Chronic respiratory failure with hypoxia: Secondary | ICD-10-CM

## 2020-03-07 DIAGNOSIS — J441 Chronic obstructive pulmonary disease with (acute) exacerbation: Secondary | ICD-10-CM

## 2020-03-07 MED ORDER — PREDNISONE 10 MG PO TABS: ORAL_TABLET | ORAL | 0 refills | Status: DC

## 2020-03-07 MED ORDER — DOXYCYCLINE HYCLATE 100 MG PO TABS
100.0000 mg | ORAL_TABLET | Freq: Two times a day (BID) | ORAL | 0 refills | Status: DC
Start: 2020-03-07 — End: 2020-06-19

## 2020-03-07 NOTE — Telephone Encounter (Signed)
°  Primary Pulmonologist: Dr.Desai Last office visit and with whom: 10/29/19 Dr.Desai What do we see them for (pulmonary problems): COPD Last OV assessment/plan:   Was appointment offered to patient (explain)?  Yes 03/07/20 at 4:00 pm with [facility]   Reason for call: Patient stated she has been wheezing and SOB for the past 3-4 days.Patient stated that she is on her oxygen 4-41/5 L. Patient stated she has used her nebs about over 75mins ago. Nothing else further needed.     Allergies  Allergen Reactions   Penicillins Other (See Comments)    Patient is unsure if she allergic to penicillin or septra. Patient states one or another caused "rib pain with a little breathing problem". Has patient had a PCN reaction causing immediate rash, facial/tongue/throat swelling, SOB or lightheadedness with hypotension: YES Has patient had a PCN reaction causing severe rash involving mucus membranes or skin necrosis: NO Has patient had a PCN reaction that required hospitalization: NO Has patient had a PCN reaction occurring within the last 10 years: NO   Septra [Sulfamethoxazole-Trimethoprim] Other (See Comments)    Patient is unsure if she allergic to septra or penicillin. Patient states one or another caused "rib pain with a little breathing problem".    Immunization History  Administered Date(s) Administered   Influenza Whole 03/29/2019   Influenza-Unspecified 03/06/2019   Pneumococcal Polysaccharide-23 07/06/2014   Tdap 11/30/2013

## 2020-03-07 NOTE — Patient Instructions (Signed)
-   RX doxycycline 1 tab BID x 10 days; prednisone taper 40mg  x 2 days; 30mg  x 2 days; 20mg  x 2 days; 10mg  x 2 days - Recommend getting tested for COVID as you are between vaccine doses  - Resume flutter valve TID  - Patient has follow-up on 9/13 with Dr. Shearon Stalls

## 2020-03-07 NOTE — Progress Notes (Signed)
Virtual Visit via Telephone Note  I connected with [name] on 03/07/20 at  4:00 PM EDT by telephone and verified that I am speaking with the correct person using two identifiers.  Location: Patient: Home Provider: Office   I discussed the limitations, risks, security and privacy concerns of performing an evaluation and management service by telephone and the availability of in person appointments. I also discussed with the patient that there may be a patient responsible charge related to this service. The patient expressed understanding and agreed to proceed.   History of Present Illness:  64 year old female, current every day smoker. PMH significant for COPD (FEV1 1.46L/ 51%), severe tacheomalacia, respiratory failure (oxygen dependent), respiratory bronchiolitis associated ILD, GERD, DM, metabolic encephalopathy, polysubstance abuse, bipolar 1 disorder, benzodiazepine dependence. [name], seen on 08/29/19 for consult. Maintained on Symbicort 160, as needed combivent/duonebs.    Previous [facility] pulmonary encounter: 10/08/2019 She had a episode today where she reports chocking on her mucus where she was gasping for her breath. She was able to recover after a few minutes and using duoneb breathing treatment. She wears oxygen at night. Uses nebulizer four times a day. Takes mucinex twice a day and nexium daily. She has not been using her flutter valve. She does not have a oximeter to check her oxygen level. She is on 4.5L oxygen.     10/22/2019 Patient contacted today for follow-up. She did not fill azithromycin prescription initially d/t cost. She has one day left and feels it has helped her congestion a lot. She has also been taking mucinex twice a day. She avoids going outside d/t pollen. She takes zytrec daily and is compliant with nexium 20mg . She hasn't started chantix d/t hx anxiety and nightmares. She reports loud snoring at night. She has not tried wellbutrin but is  interested in a medication option to help her quit smoking. She is trying to cut back use. Consider repeating PFTs  11/09/2019 Patient contacted today for acute visit. She called earlier this week with reports of productive cough with associated shortness of breath and wheezing. Send in prescription for doxycyline. No improvement. Nasal congestion has loosed up some today. She is taking her Symbicort two puffs twice a day. Uses albuterol nebulizer 3-4 times a day. She had in-lab sleep study last night, results not available. She has questions about getting covid vaccine.   12/24/2019 Patient contacted today for a follow-up televisit to review sleep study results/ hx tracheomalacia. She is actually doing pretty well. No recent exacerbations. She has a chronic cough, clear mucus yesterday. She continues taking Symbicort 160 two puffs twice daily. She wears 4.5L oxygen daily. She feels she may be getting too much oxygen and sometimes turns it down and this makes her feel better. She has not bought a pulse oximeter, encouraged her to do so to monitor her O2 levels. She continues to smoke.   03/07/2020 - Interim hx Patient had had several recent exacerbations. She received her first covid vaccine on August 20th, due for her second vaccine on September 17th. Started feeling sick 4-5 days ago. Reports increased shortness of breath and wheezing. Her breathing feels restricted. She has a productive cough with clear-yellow mucus, increased thick secretions.  She is compliant with Symbicort 160 two puffs twice daily. She has been using Duoneb and combivent every 2-3 hours. O2 is 90% on baseline 4.5L oxygen. Afebrile.    Observations/Objective:  - Congested cough, mild dyspnea when speaking   Assessment  and Plan:  Moderate COPD with acute exacerbation  - During last visit in June she was doing well. Experiencing increased sob, wheezing and sputum production x 4-5 days. Compliant with Symb 160, she is overusing  ipratropium-albuterol  - RX doxycycline 1 tab BID x 10 days; prednisone taper 40mg  x 2 days; 30mg  x 2 days; 20mg  x 2 days; 10mg  x 2 days - Advised patient get tested for COVID as she is in between vaccine doses  - Resume flutter valve TID   Chronic respiratory failure with hypoxia: - Continues on 4.5L at baseline, O2 saturation 90%  - NPSG did not show evidence of OSA ; AHI 0.4/hr, SpO2 low 75% requiring 2L oxygen.   Follow Up Instructions:   - Patient has follow-up on 9/13 with Dr. Shearon Stalls, advised she get covid tested before her apt   I discussed the assessment and treatment plan with the patient. The patient was provided an opportunity to ask questions and all were answered. The patient agreed with the plan and demonstrated an understanding of the instructions.   The patient was advised to call back or seek an in-person evaluation if the symptoms worsen or if the condition fails to improve as anticipated.  I provided 22 minutes of non-face-to-face time during this encounter.   Martyn Ehrich, NP

## 2020-03-13 ENCOUNTER — Ambulatory Visit: Payer: Medicaid Other

## 2020-03-13 ENCOUNTER — Ambulatory Visit (INDEPENDENT_AMBULATORY_CARE_PROVIDER_SITE_OTHER): Payer: Medicaid Other

## 2020-03-13 ENCOUNTER — Other Ambulatory Visit: Payer: Self-pay

## 2020-03-13 ENCOUNTER — Other Ambulatory Visit: Payer: Self-pay | Admitting: Sports Medicine

## 2020-03-13 ENCOUNTER — Ambulatory Visit (INDEPENDENT_AMBULATORY_CARE_PROVIDER_SITE_OTHER): Payer: Medicaid Other | Admitting: Sports Medicine

## 2020-03-13 ENCOUNTER — Encounter: Payer: Self-pay | Admitting: Sports Medicine

## 2020-03-13 VITALS — Temp 97.0°F

## 2020-03-13 DIAGNOSIS — M2021 Hallux rigidus, right foot: Secondary | ICD-10-CM | POA: Diagnosis not present

## 2020-03-13 DIAGNOSIS — Z9889 Other specified postprocedural states: Secondary | ICD-10-CM

## 2020-03-13 DIAGNOSIS — M6740 Ganglion, unspecified site: Secondary | ICD-10-CM

## 2020-03-13 DIAGNOSIS — M79671 Pain in right foot: Secondary | ICD-10-CM

## 2020-03-13 MED ORDER — OXYCODONE-ACETAMINOPHEN 10-325 MG PO TABS: ORAL_TABLET | ORAL | 0 refills | Status: DC

## 2020-03-13 MED ORDER — PROMETHAZINE HCL 12.5 MG PO TABS: 12.5000 mg | ORAL_TABLET | Freq: Four times a day (QID) | ORAL | 0 refills | Status: DC | PRN

## 2020-03-13 NOTE — Progress Notes (Signed)
Subjective: [name] is a 64 y.o. female patient seen today in office for POV #3 (DOS 01-28-20), S/P right excision of cyst and keller joint implant ([employer] medical flex toe). Patient admits some pain at surgical sit and and numbness but is doing okay, denies calf pain, denies headache, chest pain, shortness of breath, nausea, vomiting, fever, or chills. No other issues noted.   Patient Active Problem List   Diagnosis Date Noted  . Ganglion cyst of dorsum of left wrist s/p removal 09/07/18   . DM (diabetes mellitus), type 2 ([place]) 08/30/2017  . Respiratory bronchiolitis associated interstitial lung disease ([place]) 08/30/2017  . Hyperglycemia 08/26/2017  . Chronic pain 08/25/2017  . COPD with acute exacerbation ([place]) 08/25/2017  . Alcohol abuse 08/01/2015  . Anorgasmia of female 01/01/2015  . Low grade squamous intraepithelial lesion (LGSIL) on Papanicolaou smear of cervix 12/04/2014  . Abdominal pain, chronic, epigastric 11/20/2014  . Dysphagia, pharyngoesophageal phase   . Hx of colonic polyps   . COPD exacerbation ([place]) 07/05/2014  . GERD without esophagitis 07/05/2014  . Hypertension 07/05/2014  . Bipolar 1 disorder ([place]) 07/05/2014  . Anxiety 07/05/2014  . Polysubstance abuse ([place]) 07/05/2014  . Obesity (BMI 30-39.9) 07/05/2014  . Encephalopathy, metabolic 20/94/7096  . Personal history of colonic polyps 04/01/2014  . Mild dysplasia of cervix 11/13/2013  . Acute respiratory failure with hypoxia ([place]) 07/08/2013  . Benzodiazepine dependence ([place]) 08/26/2012  . COPD (chronic obstructive pulmonary disease) ([place]) 08/25/2012  . Panic disorder 08/25/2012  . Tobacco use disorder 08/25/2012  . Hyperlipidemia 08/25/2012    Current Outpatient Medications on File Prior to Visit  Medication Sig Dispense Refill  . Accu-Chek FastClix Lancets MISC USE TO CHECK BLOODOGLUCOSE ONCE DAILY    . ACCU-CHEK GUIDE test strip USE TO CHECK BLOODEBLUCOSE ONCE DAILY.    Marland Kitchen albuterol (PROVENTIL)  (2.5 MG/3ML) 0.083% nebulizer solution Take 3 mLs (2.5 mg total) by nebulization every 4 (four) hours as needed for wheezing or shortness of breath. 180 mL 2  . albuterol-ipratropium (COMBIVENT) 18-103 MCG/ACT inhaler Inhale 2 puffs into the lungs 4 (four) times daily.     . budesonide-formoterol (SYMBICORT) 160-4.5 MCG/ACT inhaler Inhale 2 puffs into the lungs in the morning and at bedtime. 1 Inhaler 5  . cetirizine (ZYRTEC) 10 MG tablet Take 10 mg by mouth daily as needed for allergies.     . COMBIVENT RESPIMAT 20-100 MCG/ACT AERS respimat INHALE 1 PUFF INTO LUNGS EVERY 4 HOURS. 4 g 3  . cycloSPORINE (RESTASIS) 0.05 % ophthalmic emulsion Place 1 drop into both eyes 2 (two) times daily.    . diclofenac Sodium (VOLTAREN) 1 % GEL Apply 2 g topically 3 (three) times daily as needed (pain).     Marland Kitchen docusate sodium (COLACE) 100 MG capsule Take 1 capsule (100 mg total) by mouth daily as needed. 30 capsule 2  . doxepin (SINEQUAN) 10 MG capsule Take 10 mg by mouth daily.    Marland Kitchen doxycycline (VIBRA-TABS) 100 MG tablet Take 1 tablet (100 mg total) by mouth 2 (two) times daily. 20 tablet 0  . esomeprazole (NEXIUM) 20 MG capsule Take 20 mg by mouth in the morning.     . fluticasone (FLONASE) 50 MCG/ACT nasal spray Place 1 spray into both nostrils 2 (two) times daily as needed for allergies or rhinitis. 16 g 6  . gabapentin (NEURONTIN) 800 MG tablet Take 400 mg by mouth 3 (three) times daily as needed (pain).     Marland Kitchen HYDROcodone-acetaminophen (NORCO/VICODIN) 5-325 MG  tablet Take 1 tablet by mouth 2 (two) times daily as needed. 30 tablet 0  . ILEVRO 0.3 % ophthalmic suspension Place 1 drop into the left eye daily.    Marland Kitchen ipratropium-albuterol (DUONEB) 0.5-2.5 (3) MG/3ML SOLN Take 3 mLs by nebulization every 4 (four) hours as needed. 360 mL 5  . lisinopril (ZESTRIL) 30 MG tablet Take 30 mg by mouth daily.     . metFORMIN (GLUCOPHAGE) 1000 MG tablet Take 1,000 mg by mouth 2 (two) times daily.    Marland Kitchen moxifloxacin (VIGAMOX)  0.5 % ophthalmic solution Place 1 drop into the left eye 3 (three) times daily.    . nabumetone (RELAFEN) 750 MG tablet Take 750 mg by mouth daily as needed for moderate pain.     . nitroGLYCERIN (NITROSTAT) 0.4 MG SL tablet DISSOLVE 1 TABLET UNDER THE TONGUE EVERY 5 MINUTES IF NEEDED FOR CHEST PAIN. MAX 3 DOSES THEN CALL 911. (Patient taking differently: Place 0.4 mg under the tongue every 5 (five) minutes as needed for chest pain. ) 25 tablet 3  . OXYGEN Inhale 4.5 L into the lungs continuous.     . prednisoLONE acetate (PRED FORTE) 1 % ophthalmic suspension Place 1 drop into the left eye 3 (three) times daily.    . predniSONE (DELTASONE) 10 MG tablet Take 4 tabs po daily x 2 days; then 3 tabs for 2 days; then 2 tabs for 2 days; then 1 tab for 2 days 20 tablet 0  . rOPINIRole (REQUIP) 1 MG tablet Take 1 mg by mouth 3 (three) times daily.     . simvastatin (ZOCOR) 10 MG tablet Take 5 mg by mouth daily.    . SUMAtriptan (IMITREX) 25 MG tablet SMARTSIG:1-2 Tablet(s) By Mouth PRN    . torsemide (DEMADEX) 20 MG tablet Take 1 tablet (20 mg total) by mouth daily as needed. May take an additional 20 mg daily as needed for extreme swelling. 180 tablet 3   No current facility-administered medications on file prior to visit.    Allergies  Allergen Reactions  . Penicillins Other (See Comments)    Patient is unsure if she allergic to penicillin or septra. Patient states one or another caused "rib pain with a little breathing problem". Has patient had a PCN reaction causing immediate rash, facial/tongue/throat swelling, SOB or lightheadedness with hypotension: YES Has patient had a PCN reaction causing severe rash involving mucus membranes or skin necrosis: NO Has patient had a PCN reaction that required hospitalization: NO Has patient had a PCN reaction occurring within the last 10 years: NO  . Septra [Sulfamethoxazole-Trimethoprim] Other (See Comments)    Patient is unsure if she allergic to septra or  penicillin. Patient states one or another caused "rib pain with a little breathing problem".    Objective: There were no vitals filed for this visit.  General: No acute distress, AAOx3 on 02  Right foot: S incision well-healed, minimal swelling, no redness no warmth no drainage no acute signs of infection capillary fill time <5 seconds in all digits, gross sensation present via light touch to right foot. Guarded range of motion at 1st toe that improves with manual manipulation patient is able to get about 45 degrees of dorsiflexion and 10 degrees of plantarflexion, No pain with calf compression.   Assessment and Plan:  Problem List Items Addressed This Visit    None    Visit Diagnoses    Right foot pain    -  Primary   Relevant Orders  DG Foot Complete Right   Foot pain, right       Hallux rigidus of right foot       Ganglion       S/P foot surgery, right           -Patient seen and evaluated -X-rays consistent with postoperative status -Encourage gentle range of motion exercises and may transition to normal shoe as tolerated -Refilled Percocet for pain  -Will plan for final xrays at next office visit. In the meantime, patient to call office if any issues or problems arise.   Landis Martins, DPM

## 2020-03-17 ENCOUNTER — Ambulatory Visit: Payer: Medicaid Other | Admitting: Internal Medicine

## 2020-03-18 ENCOUNTER — Other Ambulatory Visit: Payer: Medicaid Other | Admitting: Adult Health

## 2020-03-19 ENCOUNTER — Other Ambulatory Visit: Payer: Self-pay | Admitting: Sports Medicine

## 2020-03-19 ENCOUNTER — Telehealth: Payer: Self-pay | Admitting: Urology

## 2020-03-19 MED ORDER — OXYCODONE-ACETAMINOPHEN 5-325 MG PO TABS: 1.0000 | ORAL_TABLET | Freq: Three times a day (TID) | ORAL | 0 refills | Status: AC | PRN

## 2020-03-19 NOTE — Telephone Encounter (Signed)
Refill sent.

## 2020-03-19 NOTE — Progress Notes (Signed)
Refilled pain medication -Dr. Cannon Kettle

## 2020-03-19 NOTE — Telephone Encounter (Signed)
Pt would like a refill on pain meds

## 2020-03-20 ENCOUNTER — Other Ambulatory Visit: Payer: Medicaid Other

## 2020-03-20 NOTE — Telephone Encounter (Signed)
Please advise 

## 2020-03-21 ENCOUNTER — Telehealth: Payer: Self-pay | Admitting: Internal Medicine

## 2020-03-21 ENCOUNTER — Other Ambulatory Visit: Payer: Self-pay | Admitting: Internal Medicine

## 2020-03-21 DIAGNOSIS — J432 Centrilobular emphysema: Secondary | ICD-10-CM

## 2020-03-21 MED ORDER — IPRATROPIUM-ALBUTEROL 0.5-2.5 (3) MG/3ML IN SOLN: 3.0000 mL | RESPIRATORY_TRACT | 5 refills | Status: DC | PRN

## 2020-03-21 NOTE — Telephone Encounter (Signed)
Spoke with pt and advised that duoneb refill was sent to pharmacy. PT verbalized understanding. Nothing further needed.

## 2020-03-28 ENCOUNTER — Other Ambulatory Visit: Payer: Self-pay | Admitting: Sports Medicine

## 2020-03-28 ENCOUNTER — Telehealth: Payer: Self-pay | Admitting: Sports Medicine

## 2020-03-28 MED ORDER — OXYCODONE-ACETAMINOPHEN 10-325 MG PO TABS: 1.0000 | ORAL_TABLET | Freq: Three times a day (TID) | ORAL | 0 refills | Status: AC | PRN

## 2020-03-28 NOTE — Telephone Encounter (Signed)
Pt called requesting a refill of her pain medication. Pharmacy is [facility].

## 2020-03-28 NOTE — Progress Notes (Signed)
Refilled pain med

## 2020-03-28 NOTE — Telephone Encounter (Signed)
Refill sent.

## 2020-03-29 NOTE — Telephone Encounter (Signed)
Please Advise

## 2020-04-01 ENCOUNTER — Telehealth: Payer: Self-pay | Admitting: Internal Medicine

## 2020-04-01 MED ORDER — PREDNISONE 10 MG PO TABS: ORAL_TABLET | ORAL | 0 refills | Status: DC

## 2020-04-01 NOTE — Telephone Encounter (Signed)
Primary Pulmonologist:Dr.Desai  Last office visit and with whom: 03/07/20 [name]  What do we see them for (pulmonary problems): COPD Last OV assessment/plan:  Assessment and Plan:  Moderate COPD with acute exacerbation  - During last visit in June she was doing well. Experiencing increased sob, wheezing and sputum production x 4-5 days. Compliant with Symb 160, she is overusing ipratropium-albuterol  - RX doxycycline 1 tab BID x 10 days; prednisone taper 40mg  x 2 days; 30mg  x 2 days; 20mg  x 2 days; 10mg  x 2 days - Advised patient get tested for COVID as she is in between vaccine doses  - Resume flutter valve TID   Chronic respiratory failure with hypoxia: - Continues on 4.5L at baseline, O2 saturation 90%  - NPSG did not show evidence of OSA ; AHI 0.4/hr, SpO2 low 75% requiring 2L oxygen.   Follow Up Instructions:  - Patient has follow-up on 9/13 with Dr. Shearon Stalls, advised she get covid tested before her apt   I discussed the assessment and treatment plan with the patient. The patient was provided an opportunity to ask questions and all were answered. The patient agreed with the plan and demonstrated an understanding of the instructions.  The patient was advised to call back or seek an in-person evaluation if the symptoms worsen or if the condition fails to improve as anticipated.  I provided 22 minutes of non-face-to-face time during this encounter.   Martyn Ehrich, NP      Patient Instructions by Martyn Ehrich, NP at 03/07/2020 4:00 PM Author: Martyn Ehrich, NP Author Type: Nurse Practitioner Filed: 03/07/2020 5:07 PM  Note Status: Signed Cosign: Cosign Not Required Encounter Date: 03/07/2020  Editor: Martyn Ehrich, NP (Nurse Practitioner)               - RX doxycycline 1 tab BID x 10 days; prednisone taper 40mg  x 2 days; 30mg  x 2 days; 20mg  x 2 days; 10mg  x 2 days - Recommend getting tested for COVID as you are between vaccine doses  - Resume flutter  valve TID  - Patient has follow-up on 9/13 with Dr. Shearon Stalls      Instructions  - RX doxycycline 1 tab BID x 10 days; prednisone taper 40mg  x 2 days; 30mg  x 2 days; 20mg  x 2 days; 10mg  x 2 days - Recommend getting tested for COVID as you are between vaccine doses  - Resume flutter valve TID  - Patient has follow-up on 9/13 with Dr. Shearon Stalls          After Visit Summary (Automatic SnapShot taken 03/07/2020) Communications    [facility] Provider CC Chart Rep sent to Jani Gravel, MD Communication Routing History  Recipient Method Sent by Date Sent  Jani Gravel, MD In Donita Brooks, NP 03/07/2020     No questionnaires available.           Orders Placed   None Medication Changes    Doxycycline Hyclate 100 mg Oral 2 times daily   predniSONE 10 MG Take 4 tabs po daily x 2 days; then 3 tabs for 2 days; then 2 tabs for 2 days; then 1 tab for 2 days     (Completed Course)  Patient not taking:  Reported on 03/07/2020   Medication List  Visit Diagnoses    COPD with acute exacerbation ([place])   Chronic respiratory failure with hypoxia ([place])   Problem List  Level of Service  Level of Service  LOS - NO CHARGE [  NC1]  Log History  LOS History  All Charges for This Encounter  Code Description Service Date Service Provider Modifiers Qty  [id number] PR PHYS/QHP TELEPHONE EVALUATION 21-30 MIN 03/07/2020 Martyn Ehrich, NP      Was appointment offered to patient (explain)?    Reason for call: SOB,Wheezing,coingestion, no fever, headache.o2 4 1/2 L patient is using. Patient has used Nebs about a hour ago and rescue inhaler 3 hours ago. Patient does have appt with [place] for [facility] testing on 04/03/20.   Dr.Desai can you please advise.  Thank you     Allergies  Allergen Reactions  . Penicillins Other (See Comments)    Patient is unsure if she allergic to penicillin or septra. Patient states one or another caused "rib pain with a little breathing problem". Has  patient had a PCN reaction causing immediate rash, facial/tongue/throat swelling, SOB or lightheadedness with hypotension: YES Has patient had a PCN reaction causing severe rash involving mucus membranes or skin necrosis: NO Has patient had a PCN reaction that required hospitalization: NO Has patient had a PCN reaction occurring within the last 10 years: NO  . Septra [Sulfamethoxazole-Trimethoprim] Other (See Comments)    Patient is unsure if she allergic to septra or penicillin. Patient states one or another caused "rib pain with a little breathing problem".    Immunization History  Administered Date(s) Administered  . Influenza Whole 03/29/2019  . Influenza-Unspecified 03/06/2019  . Pneumococcal Polysaccharide-23 07/06/2014  . Tdap 11/30/2013

## 2020-04-01 NOTE — Telephone Encounter (Signed)
Called and spoke with patient to let her know that RX for Prednisone was sent into preferred pharmacy. She asked if an antibiotic was also sent in. Informed her that there was not. She states that she has productive cough with yellow sputum. Looks like she got Doxy 03/07/20 from [facility] and has completed it.   Dr. Shearon Stalls please advise

## 2020-04-01 NOTE — Telephone Encounter (Signed)
No further antibiotics indicated at this time. She needs to finish prednisone and follow up in clinic.

## 2020-04-01 NOTE — Telephone Encounter (Signed)
Prednisone taper ordered

## 2020-04-02 NOTE — Telephone Encounter (Signed)
Spoke with patient regarding prior message.Advised patient per Dr.Desai no further antibiotics at this time .Patient need to finish prednisone and patient does have a f/u with our office on 04/10/20.Pateint's voice was understanding . Nothing else further needed.

## 2020-04-03 ENCOUNTER — Encounter: Payer: Medicaid Other | Admitting: Sports Medicine

## 2020-04-03 ENCOUNTER — Other Ambulatory Visit: Payer: Medicaid Other

## 2020-04-03 DIAGNOSIS — Z20822 Contact with and (suspected) exposure to covid-19: Secondary | ICD-10-CM

## 2020-04-04 ENCOUNTER — Other Ambulatory Visit: Payer: Self-pay | Admitting: Sports Medicine

## 2020-04-04 ENCOUNTER — Telehealth: Payer: Self-pay | Admitting: Sports Medicine

## 2020-04-04 LAB — SARS-COV-2, NAA 2 DAY TAT

## 2020-04-04 LAB — NOVEL CORONAVIRUS, NAA: SARS-CoV-2, NAA: NOT DETECTED

## 2020-04-04 MED ORDER — OXYCODONE-ACETAMINOPHEN 7.5-325 MG PO TABS: 1.0000 | ORAL_TABLET | Freq: Three times a day (TID) | ORAL | 0 refills | Status: DC | PRN

## 2020-04-04 NOTE — Telephone Encounter (Signed)
Pt wants a refill of her pain medication before 3 so she can get it delivered. She doesn't have transportation

## 2020-04-04 NOTE — Telephone Encounter (Signed)
Please advise 

## 2020-04-04 NOTE — Progress Notes (Signed)
Refilled pain meds 

## 2020-04-04 NOTE — Telephone Encounter (Signed)
Sent!

## 2020-04-10 ENCOUNTER — Telehealth: Payer: Self-pay | Admitting: Internal Medicine

## 2020-04-10 ENCOUNTER — Telehealth: Payer: Self-pay | Admitting: Podiatrist

## 2020-04-10 ENCOUNTER — Encounter: Payer: Medicaid Other | Admitting: Sports Medicine

## 2020-04-10 ENCOUNTER — Other Ambulatory Visit: Payer: Self-pay | Admitting: Sports Medicine

## 2020-04-10 ENCOUNTER — Ambulatory Visit: Payer: Medicaid Other | Admitting: Internal Medicine

## 2020-04-10 ENCOUNTER — Encounter: Payer: Self-pay | Admitting: Internal Medicine

## 2020-04-10 ENCOUNTER — Other Ambulatory Visit: Payer: Self-pay

## 2020-04-10 VITALS — BP 130/70 | HR 79 | Temp 97.7°F | Ht 64.0 in | Wt 214.0 lb

## 2020-04-10 DIAGNOSIS — F17210 Nicotine dependence, cigarettes, uncomplicated: Secondary | ICD-10-CM | POA: Diagnosis not present

## 2020-04-10 DIAGNOSIS — J449 Chronic obstructive pulmonary disease, unspecified: Secondary | ICD-10-CM | POA: Diagnosis not present

## 2020-04-10 DIAGNOSIS — J432 Centrilobular emphysema: Secondary | ICD-10-CM

## 2020-04-10 MED ORDER — TRAZODONE HCL 50 MG PO TABS: 50.0000 mg | ORAL_TABLET | Freq: Every day | ORAL | 5 refills | Status: DC

## 2020-04-10 MED ORDER — OXYCODONE-ACETAMINOPHEN 7.5-325 MG PO TABS: 1.0000 | ORAL_TABLET | Freq: Three times a day (TID) | ORAL | 0 refills | Status: DC | PRN

## 2020-04-10 MED ORDER — BREZTRI AEROSPHERE 160-9-4.8 MCG/ACT IN AERO: 2.0000 | INHALATION_SPRAY | Freq: Two times a day (BID) | RESPIRATORY_TRACT | 0 refills | Status: DC

## 2020-04-10 NOTE — Telephone Encounter (Signed)
Dr. Shearon Stalls, please advise if you are okay with pt getting her 2nd covid vaccine tomorrow or if this needs to be pushed out.

## 2020-04-10 NOTE — Telephone Encounter (Signed)
Called and spoke with pt letting her know the info stated by Dr. Shearon Stalls and she verbalized understanding. Nothing further needed.

## 2020-04-10 NOTE — Telephone Encounter (Signed)
Patient called nurse line asking for a pain medication refill prior to the weekend.

## 2020-04-10 NOTE — Telephone Encounter (Signed)
LMTCB x1 for pt. New order has been placed to reflect having PFT done at [facility].

## 2020-04-10 NOTE — Progress Notes (Signed)
Refilled pain medication

## 2020-04-10 NOTE — Progress Notes (Signed)
[name]    [id number]    1956-03-23  Primary Care Physician:Kim, Jeneen Rinks, MD Date of Appointment: 04/10/2020 Established Patient Visit  Chief complaint:   Chief Complaint  Patient presents with  . Follow-up    very SHOB, cough with white/yellow sputum, feels that she may need abx     HPI: Previous patient of Dr. Luan Pulling for COPD. Has been on oxygen for about 2 years. 5LNC.  FEV1 51% of predicted 1.46L in 2018. She turns up oxygen when she gets short of breath.   Ongoing tobacco use disorder, almost 1ppd.  Uses combivent and pro-air in total 6-7 times/day.  Ongoing issues with chronic pain and anxiety, trouble sleeping. Dyspnea with minimal exertion at home. Last hospitalization in 2019. Dr. Luan Pulling had her on prednisone frequently, she last took it on Sunday. Would get solumedrol shots.   Interval Updates: Multiple courses of prednisone and antibiotics with no relief of her symptoms. She is still smoking. Sleeping poorly. Here with her husband today who has cut back on smoking and is encouraging her to quit. She is under a lot of stress.  I have reviewed the patient's family social and past medical history and updated as appropriate.   Past Medical History:  Diagnosis Date  . Anemia   . Anxiety   . Arthritis   . Bipolar 1 disorder ([place])   . CHF (congestive heart failure) ([place])   . COPD (chronic obstructive pulmonary disease) ([place])   . Depression   . Diabetes mellitus without complication ([place])   . Dyspnea   . Emphysema of lung ([place])   . GERD (gastroesophageal reflux disease)   . Headache    migraines  . History of hiatal hernia   . History of kidney stones   . Hyperlipidemia   . Hypertension   . Neuromuscular disorder (H[facility])    neuropathy  . On home oxygen therapy   . Pneumonia 2015, 2019  . Tracheomalacia   . Vaginal Pap smear, abnormal     Past Surgical History:  Procedure Laterality Date  . CATARACT EXTRACTION W/PHACO Left 01/14/2020    Procedure: CATARACT EXTRACTION PHACO AND INTRAOCULAR LENS PLACEMENT LEFT EYE;  Surgeon: Baruch Goldmann, MD;  Location: [facility];  Service: Ophthalmology;  Laterality: Left;  CDE: 8.18  . CATARACT EXTRACTION W/PHACO Right 02/01/2020   Procedure: CATARACT EXTRACTION PHACO AND INTRAOCULAR LENS PLACEMENT RIGHT EYE;  Surgeon: Baruch Goldmann, MD;  Location: [facility];  Service: Ophthalmology;  Laterality: Right;  CDE: 9.94  . CHEILECTOMY Right 01/28/2020   Procedure: KELLER BUNION IMPLANT RIGHT FOOT CHEILECTOMY RIGHT ROOT;  Surgeon: Landis Martins, DPM;  Location: [facility];  Service: Podiatry;  Laterality: Right;  BLOCK  . CHOLECYSTECTOMY    . COLONOSCOPY  July 2010   Dr. Arnoldo Morale: 3 rectal polyps, not enough tissue for pathologic examination, recommended surveillance in 3 years  . COLONOSCOPY N/A 10/23/2014   RMR: Multiple colonic polyps removed as described above. No endoscopic explaniation for abdominal pain. however. next tcs 10/2019  . ESOPHAGOGASTRODUODENOSCOPY  July 2010   Dr. Arnoldo Morale: gastritis and duodenitis, H.pylori negative  . ESOPHAGOGASTRODUODENOSCOPY N/A 10/23/2014   RMR: Normal EGD. Status post passage of a Maloney dilator. Today's finding s would not explain abdominal pain  . EYE SURGERY Left 01/14/2020   cataract removal  . GANGLION CYST EXCISION Left 09/07/2018   Procedure: REMOVAL GANGLION OF WRIST;  Surgeon: Carole Civil, MD;  Location: [facility];  Service: Orthopedics;  Laterality: Left;  . HERNIA REPAIR     Dr. Arnoldo Morale  . KIDNEY STONE SURGERY    . MALONEY DILATION N/A 10/23/2014   Procedure: Venia Minks DILATION;  Surgeon: Daneil Dolin, MD;  [facility]: [facility];  Service: Endoscopy;  Laterality: N/A;  . OPEN REDUCTION INTERNAL FIXATION (ORIF) DISTAL RADIAL FRACTURE Right 12/09/2015   Procedure: OPEN REDUCTION INTERNAL FIXATION (ORIF) RIGHT DISTAL RADIUS;  Surgeon: Leanora Cover, MD;  Location: [facility];  Service: Orthopedics;  Laterality: Right;    Family History   Adopted: Yes    Social History   Occupational History  . Occupation: disability  Tobacco Use  . Smoking status: Current Every Day Smoker    Packs/day: 1.50    Years: 51.00    Pack years: 76.50    Types: Cigarettes    Start date: 31  . Smokeless tobacco: Never Used  . Tobacco comment: 10/22/19- 12 cigs in a 24 hour period  Vaping Use  . Vaping Use: Never used  Substance and Sexual Activity  . Alcohol use: Yes    Alcohol/week: 1.0 standard drink    Types: 1 Standard drinks or equivalent per week    Comment: quit 11/03/2014. used to drink couple of 40 ounces.  . Drug use: No  . Sexual activity: Yes    Birth control/protection: Post-menopausal     Physical Exam: Blood pressure 130/70, pulse 79, temperature 97.7 F (36.5 C), temperature source Oral, height 5\' 4"  (1.626 m), weight 214 lb (97.1 kg), SpO2 97 %.  Gen:      No acute distress Lungs:    No increased respiratory effort, symmetric chest wall excursion, clear to auscultation bilaterally, wheezing is audible across her anterior neck and transmits to the bilateral lung fields. CV:         Regular rate and rhythm; no murmurs, rubs, or gallops.  No pedal edema   Data Reviewed: Imaging: I have personally reviewed the CT Chest March 2021 with expiratory cuts showing severe EDAC.  PFTs:  PFT Results Latest Ref Rng & Units 07/14/2016 12/06/2013  FVC-Pre L 1.48 -  FVC-Predicted Pre % 45 52  FVC-Post L - 1.93  FVC-Predicted Post % - 57  Pre FEV1/FVC % % 87 82  Post FEV1/FCV % % - 82  FEV1-Pre L 1.29 1.46  FEV1-Predicted Pre % 51 55  FEV1-Post L - 1.58  DLCO uncorrected ml/min/mmHg - 14.71  DLCO UNC% % - 60  DLCO corrected ml/min/mmHg - 14.75  DLCO COR %Predicted % - 60  DLVA Predicted % - 100  TLC L - 3.73  TLC % Predicted % - 73  RV % Predicted % - 99   I have personally reviewed the patient's PFTs and they are normal without airflow limitation.  Labs: Lab Results  Component Value Date   WBC 10.4 01/23/2020    HGB 12.7 01/23/2020   HCT 39.0 01/23/2020   MCV 97.3 01/23/2020   PLT 383 01/23/2020    Immunization status: Immunization History  Administered Date(s) Administered  . Influenza Whole 03/29/2019  . Influenza-Unspecified 03/06/2019  . Pneumococcal Polysaccharide-23 07/06/2014  . Tdap 11/30/2013    Assessment:  COPD  Ongoing Tobacco Use Severe Tracheobronchomalacia Anxiety and Depression.  Plan/Recommendations: Will obtain a full set of PFTs. IF I can confirm obstruction on spirometry she may be able to qualify for trilogy vent to help with her airway collapse. Continue her inhalers for now.  Additional courses of steroids and antibiotics are not going to  help her symptoms unless she has a true COPD exacerbation. Discussed at length symptom management including purse lipped breathing.   Discussed her anxiety and poor sleep. She is depressed by does not have active thoughts of self harm. She would like to try trazodone. I have encouraged her to follow up with PCP and seek counseling/behavioral therapy.  I personally spent 8 minutes counseling the patient regarding tobacco use disorder.  Patient is symptomatic from tobacco use disorder due to the following condition: COPD.  The patient's response was contemplative.  We discussed nicotine replacement therapy, Wellbutrin, Chantix.  We identified to gather patient specific barriers to change.  The patient is open to future discussions about tobacco cessation.     Return to Care: Return in about 3 months (around 07/11/2020).   Lenice Llamas, MD Pulmonary and [place]

## 2020-04-10 NOTE — Patient Instructions (Signed)
The patient should have follow up scheduled with myself in  months.   Prior to next visit patient should have: Full set of PFTs

## 2020-04-10 NOTE — Telephone Encounter (Signed)
Ok to get vaccine tomorrow.

## 2020-04-11 ENCOUNTER — Other Ambulatory Visit: Payer: Medicaid Other | Admitting: Adult Health

## 2020-04-14 ENCOUNTER — Telehealth: Payer: Self-pay | Admitting: Internal Medicine

## 2020-04-14 DIAGNOSIS — J432 Centrilobular emphysema: Secondary | ICD-10-CM

## 2020-04-14 DIAGNOSIS — J441 Chronic obstructive pulmonary disease with (acute) exacerbation: Secondary | ICD-10-CM

## 2020-04-14 MED ORDER — PREDNISONE 10 MG PO TABS: ORAL_TABLET | ORAL | 0 refills | Status: DC

## 2020-04-14 NOTE — Telephone Encounter (Signed)
Sounds like COPD exacerbation. Prednisone sent to pharmacy.

## 2020-04-14 NOTE — Telephone Encounter (Signed)
LMTCB x2 for pt 

## 2020-04-14 NOTE — Telephone Encounter (Signed)
Called and spoke to patient.  Patient stated since being seen by Dr. Shearon Stalls 04/10/2020, she has developed increased sob with exertion, prod cough with yellow to green and mild wheezing. Sx have been present for 3 days.  Hx of hot flashes. Denies fever.  She has had one covid vaccine. She is due for second vaccine on 04/18/2020. Using albuterol neb QID and Breztri BID with some relief in sx.  Not currently using any OTC medications to help with sx. She is also requesting order for nebulizer supplies.   Dr. Shearon Stalls, please advise. Thanks

## 2020-04-14 NOTE — Telephone Encounter (Signed)
Patient is aware of recommendations and voiced her understanding.,  Order has been placed to [employer] for nebulizer supplies.  Nothing further needed.

## 2020-04-14 NOTE — Telephone Encounter (Signed)
Lm for patient.  

## 2020-04-15 NOTE — Telephone Encounter (Signed)
lmtcb for pt.  [facility] will call to schedule pt's PFT. Will keep message open to follow up and assure PFT gets made for [facility].

## 2020-04-17 ENCOUNTER — Other Ambulatory Visit: Payer: Self-pay | Admitting: Sports Medicine

## 2020-04-18 ENCOUNTER — Other Ambulatory Visit: Payer: Self-pay | Admitting: Sports Medicine

## 2020-04-18 ENCOUNTER — Telehealth: Payer: Self-pay | Admitting: Sports Medicine

## 2020-04-18 MED ORDER — OXYCODONE-ACETAMINOPHEN 5-325 MG PO TABS: 1.0000 | ORAL_TABLET | Freq: Three times a day (TID) | ORAL | 0 refills | Status: DC | PRN

## 2020-04-18 NOTE — Telephone Encounter (Signed)
Patient call in and requested refill for Oxycodone

## 2020-04-18 NOTE — Progress Notes (Signed)
Resent pain medication via E-prescribe once the outage was resolved

## 2020-04-18 NOTE — Telephone Encounter (Signed)
FYI refill sent

## 2020-04-18 NOTE — Progress Notes (Signed)
Refilled pain meds and lowered dose to percocet 5/325mg  -Dr. Chauncey Cruel

## 2020-04-18 NOTE — Telephone Encounter (Signed)
Please advise 

## 2020-04-22 NOTE — Telephone Encounter (Signed)
Called [facility] and spoke with Arbie Cookey. She states she will have the pt called and scheduled for her PFT at [facility]. Will keep encounter open to ensure appt is made.

## 2020-04-24 ENCOUNTER — Telehealth: Payer: Self-pay | Admitting: Sports Medicine

## 2020-04-24 ENCOUNTER — Encounter: Payer: Medicaid Other | Admitting: Sports Medicine

## 2020-04-24 ENCOUNTER — Telehealth: Payer: Self-pay | Admitting: Internal Medicine

## 2020-04-24 ENCOUNTER — Other Ambulatory Visit: Payer: Self-pay | Admitting: Sports Medicine

## 2020-04-24 MED ORDER — AZITHROMYCIN 250 MG PO TABS
250.0000 mg | ORAL_TABLET | Freq: Every day | ORAL | 0 refills | Status: AC
Start: 2020-04-24 — End: 2020-04-29

## 2020-04-24 MED ORDER — OXYCODONE-ACETAMINOPHEN 5-325 MG PO TABS: 1.0000 | ORAL_TABLET | Freq: Three times a day (TID) | ORAL | 0 refills | Status: DC | PRN

## 2020-04-24 NOTE — Telephone Encounter (Signed)
Primary Pulmonologist: [name] Last office visit and with whom: 04/10/20 - [name] What do we see them for (pulmonary problems): COPD Last OV assessment/plan:  Assessment:  COPD  Ongoing Tobacco Use Severe Tracheobronchomalacia Anxiety and Depression.   Plan/Recommendations: Will obtain a full set of PFTs. IF I can confirm obstruction on spirometry she may be able to qualify for trilogy vent to help with her airway collapse. Continue her inhalers for now.   Additional courses of steroids and antibiotics are not going to help her symptoms unless she has a true COPD exacerbation. Discussed at length symptom management including purse lipped breathing.    Discussed her anxiety and poor sleep. She is depressed by does not have active thoughts of self harm. She would like to try trazodone. I have encouraged her to follow up with PCP and seek counseling/behavioral therapy.   I personally spent 8 minutes counseling the patient regarding tobacco use disorder.  Patient is symptomatic from tobacco use disorder due to the following condition: COPD.  The patient's response was contemplative.  We discussed nicotine replacement therapy, Wellbutrin, Chantix.  We identified to gather patient specific barriers to change.  The patient is open to future discussions about tobacco cessation.         Return to Care: Return in about 3 months (around 07/11/2020).  Was appointment offered to patient (explain)?  No pt is asking for antibiotic to be called in.    Reason for call: Pt c/o increased SOB and wheezing for past 2 days. Cough occas prod (white- yellow). Denies fever or sorethroat. PT using Neb 4-6 times/day. Pt is out of Breztri samples but felt like it really helped when she was on it. Pt is out of Symbicort as well. Pred taper was called in on 04/14/20. Pt states she is still taking Prednisone 10mg /day.   (examples of things to ask: : When did symptoms start? Fever? Cough? Productive? Color to sputum? More  sputum than usual? Wheezing? Have you needed increased oxygen? Are you taking your respiratory medications? What over the counter measures have you tried?)  Allergies  Allergen Reactions  . Penicillins Other (See Comments)    Patient is unsure if she allergic to penicillin or septra. Patient states one or another caused "rib pain with a little breathing problem". Has patient had a PCN reaction causing immediate rash, facial/tongue/throat swelling, SOB or lightheadedness with hypotension: YES Has patient had a PCN reaction causing severe rash involving mucus membranes or skin necrosis: NO Has patient had a PCN reaction that required hospitalization: NO Has patient had a PCN reaction occurring within the last 10 years: NO  . Septra [Sulfamethoxazole-Trimethoprim] Other (See Comments)    Patient is unsure if she allergic to septra or penicillin. Patient states one or another caused "rib pain with a little breathing problem".    Immunization History  Administered Date(s) Administered  . Influenza Whole 03/29/2019  . Influenza-Unspecified 03/06/2019  . Moderna SARS-COVID-2 Vaccination 02/22/2020, 04/18/2020  . Pneumococcal Polysaccharide-23 07/06/2014  . Tdap 11/30/2013

## 2020-04-24 NOTE — Telephone Encounter (Signed)
Pt would like pain meds refill. She r/s appointment today due to transportation not coming

## 2020-04-24 NOTE — Telephone Encounter (Signed)
Azithromycin sent to pharmacy

## 2020-04-24 NOTE — Telephone Encounter (Signed)
Refill sent.

## 2020-04-24 NOTE — Progress Notes (Signed)
Refilled pain meds 

## 2020-04-24 NOTE — Telephone Encounter (Signed)
Called and spoke to pt. Informed her of the recs per Dr. Shearon Stalls, rx has already been sent to pharmacy. Pt verbalized understanding and denied any further questions or concerns at this time.

## 2020-04-28 ENCOUNTER — Telehealth: Payer: Self-pay | Admitting: Internal Medicine

## 2020-04-28 MED ORDER — BREZTRI AEROSPHERE 160-9-4.8 MCG/ACT IN AERO
2.0000 | INHALATION_SPRAY | Freq: Two times a day (BID) | RESPIRATORY_TRACT | 5 refills | Status: DC
Start: 2020-04-28 — End: 2020-11-28

## 2020-04-28 NOTE — Telephone Encounter (Signed)
Called and spoke with patient to let her know we got her message about doing PFT at [facility]. Order placed for [facility] on 04/10/2020. Called and spoke with Judeen Hammans to call and get the patient scheduled. Judeen Hammans stated that they don't have anything available until December. Advised her to please call patient and see if she wanted to get scheduled for that time. She expressed understanding. Nothing further needed at this time.

## 2020-04-28 NOTE — Telephone Encounter (Signed)
Ok to prescribe breztri for her. I can't give her any shots for her breathing in the office. I think if she's feeling weak and tired she should get evaluated in the ED and make sure she doesn't have pneumonia.

## 2020-04-28 NOTE — Telephone Encounter (Signed)
Called and spoke with pt who stated she began having worsening SOB and wheezing a lot which began 2 days ago. She stated that she is also more tired and weak feeling as well.  Pt denies any complaints of fever as temp today was 98.1  Pt is using the combivent inhaler 4 times daily. Pt has also been using her nebulizer stating that she has had to do 2 tx today but stated over the weekend she had to do at least 5 tx daily.  Pt wants to know what we recommend to help with her symptoms.  Pt also would like to have Rx sent to pharmacy for the [facility] inhaler as she stated the samples that she was given seemed to work.  Dr. Shearon Stalls, please advise.

## 2020-04-28 NOTE — Telephone Encounter (Signed)
Rx for [name] has been sent to pharmacy for pt. Called and spoke with pt letting her know the info per Dr. Shearon Stalls and she verbalized understanding. Nothing further needed.

## 2020-04-29 NOTE — Telephone Encounter (Signed)
PFT has been scheduled for 06/17/20 at [facility]. Nothing further needed at this time.

## 2020-05-01 ENCOUNTER — Telehealth: Payer: Self-pay | Admitting: *Deleted

## 2020-05-01 NOTE — Telephone Encounter (Signed)
Medication name and strength: Breztri Provider: Shearon Stalls Pharmacy: [facility] Patient insurance ID: not provided on PA form Phone:  Fax:   Was the PA started on CMM?  Yes Tasheba Whittlesey Key: [id number] - PA Case ID: [id number] - Rx #: [id number]

## 2020-05-05 ENCOUNTER — Other Ambulatory Visit: Payer: Self-pay | Admitting: Sports Medicine

## 2020-05-05 NOTE — Telephone Encounter (Signed)
Please advise 

## 2020-05-07 ENCOUNTER — Telehealth: Payer: Self-pay | Admitting: Internal Medicine

## 2020-05-07 NOTE — Telephone Encounter (Signed)
I'm not aware of any nurses that would come and bathe her.  Home nurses usually come to administer medications and monitor vitals.  For a home aide, I'd ask her PCP - I'm not sure our office has resources for this.

## 2020-05-07 NOTE — Telephone Encounter (Signed)
Approved on October 28 PA Case: [id number], Status: Approved, Coverage Starts on: 05/01/2020 12:00:00 AM, Coverage Ends on: 05/01/2021 12:00:00 AM.  ATC patient, left detailed message letting her know about the PA being approved.

## 2020-05-07 NOTE — Telephone Encounter (Signed)
Spoke to patient, who is requesting referral for home health nurse due to sob and inability to care for herself. She expressed concerns about not being able to bathe herself. Patient has not contact PCP for referral.  Dr. Shearon Stalls, please advise. thanks

## 2020-05-08 ENCOUNTER — Encounter: Payer: Medicaid Other | Admitting: Sports Medicine

## 2020-05-08 NOTE — Telephone Encounter (Signed)
Called and spoke with pt letting her know the info stated by Dr. Shearon Stalls and that she needed to contact PCP in regards to the referral. Pt verbalized understanding. Nothing further needed.

## 2020-05-08 NOTE — Telephone Encounter (Signed)
ATC x2, left detailed message letting her know that her Judithann Sauger PA had been approved.  Advised to call with any questions.

## 2020-05-09 NOTE — Telephone Encounter (Signed)
ATC x3, left detailed message regarding [name] being approved.  Advised to call with any questions.  Closing encounter per office policy. Nothing further needed.

## 2020-05-15 ENCOUNTER — Other Ambulatory Visit: Payer: Self-pay | Admitting: Sports Medicine

## 2020-05-15 ENCOUNTER — Telehealth: Payer: Self-pay

## 2020-05-15 ENCOUNTER — Telehealth: Payer: Self-pay | Admitting: Sports Medicine

## 2020-05-15 MED ORDER — OXYCODONE-ACETAMINOPHEN 5-325 MG PO TABS
ORAL_TABLET | ORAL | 0 refills | Status: DC
Start: 2020-05-15 — End: 2020-05-22

## 2020-05-15 NOTE — Progress Notes (Signed)
Refilled pain medication -Dr. Chauncey Cruel

## 2020-05-15 NOTE — Telephone Encounter (Signed)
Refill sent.

## 2020-05-15 NOTE — Telephone Encounter (Signed)
Patient called in requesting refill for oxycodone, please advise

## 2020-05-15 NOTE — Telephone Encounter (Signed)
Pt would like a refill on pain medication. Please advise

## 2020-05-22 ENCOUNTER — Other Ambulatory Visit: Payer: Medicaid Other | Admitting: Adult Health

## 2020-05-22 ENCOUNTER — Other Ambulatory Visit: Payer: Self-pay | Admitting: Sports Medicine

## 2020-05-22 ENCOUNTER — Telehealth: Payer: Self-pay | Admitting: Sports Medicine

## 2020-05-22 ENCOUNTER — Encounter: Payer: Medicare Other | Admitting: Sports Medicine

## 2020-05-22 MED ORDER — OXYCODONE-ACETAMINOPHEN 5-325 MG PO TABS: ORAL_TABLET | ORAL | 0 refills | Status: DC

## 2020-05-22 NOTE — Telephone Encounter (Signed)
Patients transportation was cancelled and she was unable to come in today. She is r/s for 06/05/2020, She would like to know if she can get a refill on her pain meds.

## 2020-05-22 NOTE — Telephone Encounter (Signed)
Call patient to inform

## 2020-05-22 NOTE — [place] Notes (Signed)
Refilled her medication for pain this time only

## 2020-05-22 NOTE — Telephone Encounter (Signed)
FYI: Refill sent

## 2020-05-26 ENCOUNTER — Other Ambulatory Visit (HOSPITAL_COMMUNITY): Payer: Self-pay | Admitting: Adult Health

## 2020-05-26 DIAGNOSIS — Z1231 Encounter for screening mammogram for malignant neoplasm of breast: Secondary | ICD-10-CM

## 2020-05-27 ENCOUNTER — Telehealth: Payer: Self-pay

## 2020-05-27 ENCOUNTER — Other Ambulatory Visit: Payer: Self-pay | Admitting: Sports Medicine

## 2020-05-27 DIAGNOSIS — M25559 Pain in unspecified hip: Secondary | ICD-10-CM | POA: Insufficient documentation

## 2020-05-27 MED ORDER — OXYCODONE-ACETAMINOPHEN 5-325 MG PO TABS: ORAL_TABLET | ORAL | 0 refills | Status: DC

## 2020-05-27 NOTE — Telephone Encounter (Addendum)
Pt would like a refill on pain medication before the holidays

## 2020-05-27 NOTE — Telephone Encounter (Signed)
sent 

## 2020-05-27 NOTE — Telephone Encounter (Signed)
Error

## 2020-05-27 NOTE — Progress Notes (Signed)
Refilled pain medication -Dr. Chauncey Cruel

## 2020-05-30 ENCOUNTER — Telehealth: Payer: Self-pay | Admitting: Internal Medicine

## 2020-05-30 DIAGNOSIS — J9601 Acute respiratory failure with hypoxia: Secondary | ICD-10-CM

## 2020-05-30 DIAGNOSIS — J449 Chronic obstructive pulmonary disease, unspecified: Secondary | ICD-10-CM

## 2020-05-30 NOTE — Telephone Encounter (Signed)
Spoke with patient, she is stating she was told order was placed for nebulizer machine and supplies, looks like medication was placed not supplies or machine.   Will route to [facility]  New order placed

## 2020-05-30 NOTE — Telephone Encounter (Signed)
Faxed order to [employer]

## 2020-06-04 ENCOUNTER — Telehealth: Payer: Self-pay | Admitting: Internal Medicine

## 2020-06-04 MED ORDER — AZITHROMYCIN 250 MG PO TABS: ORAL_TABLET | ORAL | 0 refills | Status: DC

## 2020-06-04 NOTE — Telephone Encounter (Signed)
Called and spoke with patient, advised on recommendations by Dr. Halford Chessman.  Z-pack sent to patient pharmacy after verifying pharmacy.  Patient verified understanding.  Offered patient an appointment with Dr. Shearon Stalls on Monday 06/09/20, however, she already has appointments scheduled for that day and she has to give transportation 3 days notice.  Patient scheduled for an OV for 06/12/2020 with app for 12 pm.  Nothing further needed.

## 2020-06-04 NOTE — Telephone Encounter (Signed)
Called and spoke with pt and offered her an appt but she said that it takes her about 3 days to get transportation set up.  VS she is asking for abx to be sent in and she is willing to make an appt but it would be later this week.

## 2020-06-04 NOTE — Telephone Encounter (Signed)
Primary Pulmonologist:  Dr. Shearon Peterson Last office visit and with whom: 04/10/2020 [name] What do we see them for (pulmonary problems): COPD, chronic bronchitis and emphysema Last OV assessment/plan:  Assessment:  COPD  Ongoing Tobacco Use Severe Tracheobronchomalacia Anxiety and Depression.  Plan/Recommendations: Will obtain a full set of PFTs. IF I can confirm obstruction on spirometry she may be able to qualify for trilogy vent to help with her airway collapse. Continue her inhalers for now.  Additional courses of steroids and antibiotics are not going to help her symptoms unless she has a true COPD exacerbation. Discussed at length symptom management including purse lipped breathing.   Discussed her anxiety and poor sleep. She is depressed by does not have active thoughts of self harm. She would like to try trazodone. I have encouraged her to follow up with PCP and seek counseling/behavioral therapy.  I personally spent 8 minutes counseling the patient regarding tobacco use disorder.  Patient is symptomatic from tobacco use disorder due to the following condition: COPD.  The patient's response was contemplative.  We discussed nicotine replacement therapy, Wellbutrin, Chantix.  We identified to gather patient specific barriers to change.  The patient is open to future discussions about tobacco cessation.     Return to Care: Return in about 3 months (around 07/11/2020).   Rebekah Llamas, MD Pulmonary and [place]         Patient Instructions by Rebekah Geralds, MD at 04/10/2020 9:00 AM Author: Spero Geralds, MD Author Type: Physician Filed: 04/10/2020 9:35 AM  Note Status: Signed Cosign: Cosign Not Required Encounter Date: 04/10/2020  Editor: Rebekah Geralds, MD (Physician)               The patient should have follow up scheduled with myself in  months.   Prior to next visit patient should have: Full set of  PFTs     Instructions    Return in about 3 months (around 07/11/2020). The patient should have follow up scheduled with myself in  months.   Prior to next visit patient should have: Full set of PFTs         After Visit Summary (Printed 04/10/2020)    Reason for call:  Coughing at times.  Wheezing, chest tightness and mucus is yellow for the past 3 days.   Has a cough, it is getting harder to cough the mucus up.  Oxygen was at 4.5 L.  Sats has been 95% on 4.5L.  Oxygen at 5L sitting still and with exertion.  Has a flutter and is using.  Using nebulizer about every 4 hours, gets some relief, at times no relief.  Has had covid vaccines, no sick contacts.  Denies fever, chills, body aches.  Still smoking 1 pk/24 hours.  Working on cutting back.  Interested in using nicorette gum or patches.  Dr. Halford Chessman, please advise.  (examples of things to ask: : When did symptoms start? Fever? Cough? Productive? Color to sputum? More sputum than usual? Wheezing? Have you needed increased oxygen? Are you taking your respiratory medications? What over the counter measures have you tried?)  Allergies  Allergen Reactions  . Penicillins Other (See Comments)    Patient is unsure if she allergic to penicillin or septra. Patient states one or another caused "rib pain with a little breathing problem". Has patient had a PCN reaction causing immediate rash, facial/tongue/throat swelling, SOB or lightheadedness with hypotension: YES Has patient had a PCN reaction causing severe rash  involving mucus membranes or skin necrosis: NO Has patient had a PCN reaction that required hospitalization: NO Has patient had a PCN reaction occurring within the last 10 years: NO  . Septra [Sulfamethoxazole-Trimethoprim] Other (See Comments)    Patient is unsure if she allergic to septra or penicillin. Patient states one or another caused "rib pain with a little breathing problem".    Immunization History  Administered Date(s)  Administered  . Influenza Whole 03/29/2019  . Influenza-Unspecified 03/06/2019  . Moderna SARS-COVID-2 Vaccination 02/22/2020, 04/18/2020  . Pneumococcal Polysaccharide-23 07/06/2014  . Tdap 11/30/2013

## 2020-06-04 NOTE — Telephone Encounter (Signed)
Needs ROV to assess further.

## 2020-06-04 NOTE — Telephone Encounter (Signed)
Send script for zpak.  Still needs to schedule ROV.

## 2020-06-05 ENCOUNTER — Ambulatory Visit (INDEPENDENT_AMBULATORY_CARE_PROVIDER_SITE_OTHER): Payer: Medicare Other

## 2020-06-05 ENCOUNTER — Ambulatory Visit (INDEPENDENT_AMBULATORY_CARE_PROVIDER_SITE_OTHER): Payer: Medicare Other | Admitting: Sports Medicine

## 2020-06-05 ENCOUNTER — Other Ambulatory Visit: Payer: Self-pay

## 2020-06-05 ENCOUNTER — Other Ambulatory Visit: Payer: Self-pay | Admitting: Sports Medicine

## 2020-06-05 ENCOUNTER — Encounter: Payer: Self-pay | Admitting: Sports Medicine

## 2020-06-05 DIAGNOSIS — M2021 Hallux rigidus, right foot: Secondary | ICD-10-CM

## 2020-06-05 DIAGNOSIS — M79671 Pain in right foot: Secondary | ICD-10-CM | POA: Diagnosis not present

## 2020-06-05 DIAGNOSIS — M6740 Ganglion, unspecified site: Secondary | ICD-10-CM

## 2020-06-05 DIAGNOSIS — G8918 Other acute postprocedural pain: Secondary | ICD-10-CM

## 2020-06-05 DIAGNOSIS — Z9889 Other specified postprocedural states: Secondary | ICD-10-CM

## 2020-06-05 MED ORDER — OXYCODONE-ACETAMINOPHEN 5-325 MG PO TABS
ORAL_TABLET | ORAL | 0 refills | Status: DC
Start: 2020-06-05 — End: 2020-06-12

## 2020-06-05 NOTE — [place] Notes (Signed)
Subjective: [name] is a 64 y.o. female patient seen today in office for POV #4 (DOS 01-28-20), S/P right excision of cyst and keller joint implant (wright medical flex toe). Patient admits she is doing okay, denies calf pain, denies headache, chest pain, shortness of breath, nausea, vomiting, fever, or chills. No other issues noted.   Patient Active Problem List   Diagnosis Date Noted  . Ganglion cyst of dorsum of left wrist s/p removal 09/07/18   . DM (diabetes mellitus), type 2 ([place]) 08/30/2017  . Respiratory bronchiolitis associated interstitial lung disease ([place]) 08/30/2017  . Hyperglycemia 08/26/2017  . Chronic pain 08/25/2017  . COPD with acute exacerbation ([place]) 08/25/2017  . Alcohol abuse 08/01/2015  . Anorgasmia of female 01/01/2015  . Low grade squamous intraepithelial lesion (LGSIL) on Papanicolaou smear of cervix 12/04/2014  . Abdominal pain, chronic, epigastric 11/20/2014  . Dysphagia, pharyngoesophageal phase   . Hx of colonic polyps   . COPD exacerbation ([place]) 07/05/2014  . GERD without esophagitis 07/05/2014  . Hypertension 07/05/2014  . Bipolar 1 disorder ([place]) 07/05/2014  . Anxiety 07/05/2014  . Polysubstance abuse ([place]) 07/05/2014  . Obesity (BMI 30-39.9) 07/05/2014  . Encephalopathy, metabolic 73/22/0254  . Personal history of colonic polyps 04/01/2014  . Mild dysplasia of cervix 11/13/2013  . Acute respiratory failure with hypoxia ([place]) 07/08/2013  . Benzodiazepine dependence ([place]) 08/26/2012  . COPD (chronic obstructive pulmonary disease) ([place]) 08/25/2012  . Panic disorder 08/25/2012  . Tobacco use disorder 08/25/2012  . Hyperlipidemia 08/25/2012    Current Outpatient Medications on File Prior to Visit  Medication Sig Dispense Refill  . Accu-Chek FastClix Lancets MISC USE TO CHECK BLOODOGLUCOSE ONCE DAILY    . ACCU-CHEK GUIDE test strip USE TO CHECK BLOODEBLUCOSE ONCE DAILY.    Marland Kitchen albuterol (PROVENTIL) (2.5 MG/3ML) 0.083% nebulizer solution Take  3 mLs (2.5 mg total) by nebulization every 4 (four) hours as needed for wheezing or shortness of breath. 180 mL 2  . albuterol-ipratropium (COMBIVENT) 18-103 MCG/ACT inhaler Inhale 2 puffs into the lungs 4 (four) times daily.     Marland Kitchen azithromycin (ZITHROMAX Z-PAK) 250 MG tablet Take 2 tablets today and then 1 tablet daily until gone. 6 each 0  . Budeson-Glycopyrrol-Formoterol (BREZTRI AEROSPHERE) 160-9-4.8 MCG/ACT AERO Inhale 2 puffs into the lungs in the morning and at bedtime. 10.7 g 5  . cetirizine (ZYRTEC) 10 MG tablet Take 10 mg by mouth daily as needed for allergies.     . COMBIVENT RESPIMAT 20-100 MCG/ACT AERS respimat INHALE 1 PUFF INTO LUNGS EVERY 4 HOURS. 4 g 3  . cycloSPORINE (RESTASIS) 0.05 % ophthalmic emulsion Place 1 drop into both eyes 2 (two) times daily. (Patient not taking: Reported on 04/10/2020)    . diclofenac Sodium (VOLTAREN) 1 % GEL Apply 2 g topically 3 (three) times daily as needed (pain).  (Patient not taking: Reported on 04/10/2020)    . docusate sodium (COLACE) 100 MG capsule Take 1 capsule (100 mg total) by mouth daily as needed. (Patient not taking: Reported on 04/10/2020) 30 capsule 2  . doxepin (SINEQUAN) 10 MG capsule Take 10 mg by mouth daily.    Marland Kitchen doxycycline (VIBRA-TABS) 100 MG tablet Take 1 tablet (100 mg total) by mouth 2 (two) times daily. (Patient not taking: Reported on 04/10/2020) 20 tablet 0  . esomeprazole (NEXIUM) 20 MG capsule Take 20 mg by mouth in the morning.     . fluticasone (FLONASE) 50 MCG/ACT nasal spray Place 1 spray into both nostrils 2 (two)  times daily as needed for allergies or rhinitis. 16 g 6  . gabapentin (NEURONTIN) 800 MG tablet Take 400 mg by mouth 3 (three) times daily as needed (pain).     . ILEVRO 0.3 % ophthalmic suspension Place 1 drop into the left eye daily.    Marland Kitchen ipratropium-albuterol (DUONEB) 0.5-2.5 (3) MG/3ML SOLN Take 3 mLs by nebulization every 4 (four) hours as needed. 360 mL 5  . lisinopril (ZESTRIL) 30 MG tablet Take 30 mg by  mouth daily.     . metFORMIN (GLUCOPHAGE) 1000 MG tablet Take 1,000 mg by mouth 2 (two) times daily.    Marland Kitchen moxifloxacin (VIGAMOX) 0.5 % ophthalmic solution Place 1 drop into the left eye 3 (three) times daily.    . nabumetone (RELAFEN) 750 MG tablet Take 750 mg by mouth daily as needed for moderate pain.     . nitroGLYCERIN (NITROSTAT) 0.4 MG SL tablet DISSOLVE 1 TABLET UNDER THE TONGUE EVERY 5 MINUTES IF NEEDED FOR CHEST PAIN. MAX 3 DOSES THEN CALL 911. (Patient taking differently: Place 0.4 mg under the tongue every 5 (five) minutes as needed for chest pain. ) 25 tablet 3  . OXYGEN Inhale 4.5 L into the lungs continuous.     . prednisoLONE acetate (PRED FORTE) 1 % ophthalmic suspension Place 1 drop into the left eye 3 (three) times daily.    . predniSONE (DELTASONE) 10 MG tablet Take 4 tabs po daily x 2 days; then 3 tabs for 2 days; then 2 tabs for 2 days; then 1 tab for 2 days 20 tablet 0  . promethazine (PHENERGAN) 12.5 MG tablet Take 1 tablet (12.5 mg total) by mouth every 6 (six) hours as needed for nausea or vomiting. 30 tablet 0  . rOPINIRole (REQUIP) 1 MG tablet Take 1 mg by mouth 3 (three) times daily.     . simvastatin (ZOCOR) 10 MG tablet Take 5 mg by mouth daily.    . SUMAtriptan (IMITREX) 25 MG tablet SMARTSIG:1-2 Tablet(s) By Mouth PRN (Patient not taking: Reported on 04/10/2020)    . torsemide (DEMADEX) 20 MG tablet Take 1 tablet (20 mg total) by mouth daily as needed. May take an additional 20 mg daily as needed for extreme swelling. 180 tablet 3  . traZODone (DESYREL) 50 MG tablet Take 1 tablet (50 mg total) by mouth at bedtime. 30 tablet 5   No current facility-administered medications on file prior to visit.    Allergies  Allergen Reactions  . Penicillins Other (See Comments)    Patient is unsure if she allergic to penicillin or septra. Patient states one or another caused "rib pain with a little breathing problem". Has patient had a PCN reaction causing immediate rash,  facial/tongue/throat swelling, SOB or lightheadedness with hypotension: YES Has patient had a PCN reaction causing severe rash involving mucus membranes or skin necrosis: NO Has patient had a PCN reaction that required hospitalization: NO Has patient had a PCN reaction occurring within the last 10 years: NO  . Septra [Sulfamethoxazole-Trimethoprim] Other (See Comments)    Patient is unsure if she allergic to septra or penicillin. Patient states one or another caused "rib pain with a little breathing problem".    Objective: There were no vitals filed for this visit.  General: No acute distress, AAOx3 on 02  Right foot: Surgical incision well-healed, minimal swelling, no redness no warmth no drainage no acute signs of infection capillary fill time <5 seconds in all digits, gross sensation present via light touch to  right foot. Guarded range of motion at 1st toe that improves with manual manipulation patient is able to get about 60 degrees of dorsiflexion and 10 degrees of plantarflexion, No pain with calf compression.   Assessment and Plan:  Problem List Items Addressed This Visit    None    Visit Diagnoses    Post-op pain    -  Primary   Hallux rigidus of right foot       Right foot pain       Ganglion       S/P foot surgery, right         -Patient seen and evaluated -X-rays consistent with postoperative status -Continue with gentle range of motion -Continue with good supportive shoes  -Refilled Percocet for pain this time only -Return PRN. In the meantime, patient to call office if any issues or problems arise.   Landis Martins, DPM

## 2020-06-06 ENCOUNTER — Telehealth: Payer: Self-pay | Admitting: Internal Medicine

## 2020-06-06 NOTE — Telephone Encounter (Signed)
If she can not come in person, which I strongly recommend she does if fully vaccinated, she can do televisit with [name] any of those days

## 2020-06-06 NOTE — Telephone Encounter (Signed)
Seen by Volanda Napoleon NP will route to her

## 2020-06-06 NOTE — Telephone Encounter (Signed)
Spoke with pt, aware of recs.  Changed appt on 12/9 at 1200 with Aaron Edelman to 12/9 with Dr. Shearon Stalls.  Pt will finish zpak.  Nothing further needed at this time- will close encounter.

## 2020-06-06 NOTE — Telephone Encounter (Signed)
Agree she needs to complete course of Zpack that was sent in. Make sure she is taking Breztri twice daily, can use albuterol nebulizer every 6 hours as needed. She needs follow-up with her primary pulmonologist, Dr. Shearon Stalls, not Wyn Quaker who has never seen her. Dr. Shearon Stalls has available on the 7th, 9th and 10th.   Does not appear she has had PFTs that have been ordered, spirometry last done in 2018. These will need to be scheduled at some point as well

## 2020-06-06 NOTE — Telephone Encounter (Signed)
Spoke with the pt  She is c/o wheezing, SOB, cough- non prod throat irritation  She states her throat feels dry and is burning- made worse by drinking koolaid (advised to avoid this) She denies any f/c/s body aches  She called with these symptoms on 06/04/20- appt scheduled for 06/12/20 and zpack called in  She just started the zpack today, about an hour ago  I advised needs to give this time to work, keep appt and go to [place] sooner if symptoms get worse  Will forward to APP of the day since [name] if scheduled off  Please advise thanks!

## 2020-06-06 NOTE — Telephone Encounter (Signed)
ATC patient.  LMTCB. 

## 2020-06-11 ENCOUNTER — Other Ambulatory Visit: Payer: Medicaid Other | Admitting: Adult Health

## 2020-06-12 ENCOUNTER — Ambulatory Visit: Payer: Medicare Other | Admitting: Pulmonary Disease

## 2020-06-12 ENCOUNTER — Other Ambulatory Visit: Payer: Self-pay | Admitting: Sports Medicine

## 2020-06-12 ENCOUNTER — Ambulatory Visit: Payer: Medicare Other | Admitting: Internal Medicine

## 2020-06-12 ENCOUNTER — Telehealth: Payer: Self-pay | Admitting: Sports Medicine

## 2020-06-12 NOTE — Telephone Encounter (Signed)
Refill sent already

## 2020-06-12 NOTE — Telephone Encounter (Signed)
Patient has requested refill on oxycodone, please advise

## 2020-06-12 NOTE — Telephone Encounter (Signed)
Please advise 

## 2020-06-13 ENCOUNTER — Other Ambulatory Visit: Payer: Self-pay | Admitting: Primary Care

## 2020-06-13 ENCOUNTER — Other Ambulatory Visit (HOSPITAL_COMMUNITY)
Admission: RE | Admit: 2020-06-13 | Discharge: 2020-06-13 | Disposition: A | Discharge status: Home / Self Care | Payer: Medicare Other | Source: Ambulatory Visit | Attending: Internal Medicine | Admitting: Internal Medicine

## 2020-06-13 ENCOUNTER — Other Ambulatory Visit: Payer: Self-pay

## 2020-06-13 DIAGNOSIS — Z01812 Encounter for preprocedural laboratory examination: Secondary | ICD-10-CM | POA: Insufficient documentation

## 2020-06-13 DIAGNOSIS — Z20822 Contact with and (suspected) exposure to covid-19: Secondary | ICD-10-CM | POA: Insufficient documentation

## 2020-06-13 LAB — SARS CORONAVIRUS 2 (TAT 6-24 HRS): SARS Coronavirus 2: NEGATIVE

## 2020-06-17 ENCOUNTER — Ambulatory Visit (HOSPITAL_COMMUNITY): Admission: RE | Admit: 2020-06-17 | Payer: Medicare Other | Source: Ambulatory Visit

## 2020-06-19 ENCOUNTER — Telehealth: Payer: Self-pay | Admitting: Sports Medicine

## 2020-06-19 ENCOUNTER — Encounter: Payer: Self-pay | Admitting: Internal Medicine

## 2020-06-19 ENCOUNTER — Other Ambulatory Visit: Payer: Self-pay

## 2020-06-19 ENCOUNTER — Ambulatory Visit (INDEPENDENT_AMBULATORY_CARE_PROVIDER_SITE_OTHER): Payer: Medicare Other | Admitting: Internal Medicine

## 2020-06-19 VITALS — BP 146/76 | HR 87 | Temp 97.6°F | Ht 64.0 in | Wt 223.2 lb

## 2020-06-19 DIAGNOSIS — J449 Chronic obstructive pulmonary disease, unspecified: Secondary | ICD-10-CM | POA: Diagnosis not present

## 2020-06-19 DIAGNOSIS — J411 Mucopurulent chronic bronchitis: Secondary | ICD-10-CM

## 2020-06-19 NOTE — Telephone Encounter (Signed)
Patient has requested refill on oxycodone

## 2020-06-19 NOTE — Patient Instructions (Signed)
The patient should have follow up scheduled with myself in 3 months.   Spirometry today.

## 2020-06-19 NOTE — Telephone Encounter (Signed)
Patient must see pain management in order to get more pain meds. I can refer her if she want me to. Thanks Dr. Cannon Kettle

## 2020-06-19 NOTE — [place] Notes (Signed)
[name]    [id number]    Dec 30, 1955  Primary Care Physician:Kim, Jeneen Rinks, MD Date of Appointment: 06/19/2020 Established Patient Visit  Chief complaint:   Chief Complaint  Patient presents with  . Acute Visit    Shortness of breath all the time started 2 days , cough productive white-yellow in color, wheezing, albuterol nebulizer 4-5x a day      HPI: Previous patient of Dr. Luan Pulling for COPD. Has been on oxygen since 2018. years. 4-5LNC.  FEV1 51% of predicted 1.46L in 2018. She turns up oxygen when she gets short of breath.   Ongoing tobacco use disorder, Still 1ppd.  Uses combivent and pro-air in total 6-7 times/day.  Ongoing issues with chronic pain and anxiety, trouble sleeping. Dyspnea with minimal exertion at home. Last hospitalization in 2019. Dr. Luan Pulling had her on prednisone frequently, she would get solumedrol shots.   Interval Updates: Still smoking. Sleeping improved a little with trazodone. Taking trazodone as needed, not scheduled. Feels that she is having trouble breathing again. Has intermittent good days where she can go to the grocery store, do chores, go for walks.   I have reviewed the patient's family social and past medical history and updated as appropriate.   Past Medical History:  Diagnosis Date  . Anemia   . Anxiety   . Arthritis   . Bipolar 1 disorder ([place])   . CHF (congestive heart failure) ([place])   . COPD (chronic obstructive pulmonary disease) ([place])   . Depression   . Diabetes mellitus without complication ([place])   . Dyspnea   . Emphysema of lung ([place])   . GERD (gastroesophageal reflux disease)   . Headache    migraines  . History of hiatal hernia   . History of kidney stones   . Hyperlipidemia   . Hypertension   . Neuromuscular disorder (H[facility])    neuropathy  . On home oxygen therapy   . Pneumonia 2015, 2019  . Tracheomalacia   . Vaginal Pap smear, abnormal     Past Surgical History:  Procedure Laterality Date   . CATARACT EXTRACTION W/PHACO Left 01/14/2020   Procedure: CATARACT EXTRACTION PHACO AND INTRAOCULAR LENS PLACEMENT LEFT EYE;  Surgeon: Baruch Goldmann, MD;  Location: [facility];  Service: Ophthalmology;  Laterality: Left;  CDE: 8.18  . CATARACT EXTRACTION W/PHACO Right 02/01/2020   Procedure: CATARACT EXTRACTION PHACO AND INTRAOCULAR LENS PLACEMENT RIGHT EYE;  Surgeon: Baruch Goldmann, MD;  Location: [facility];  Service: Ophthalmology;  Laterality: Right;  CDE: 9.94  . CHEILECTOMY Right 01/28/2020   Procedure: KELLER BUNION IMPLANT RIGHT FOOT CHEILECTOMY RIGHT ROOT;  Surgeon: Landis Martins, DPM;  Location: [facility];  Service: Podiatry;  Laterality: Right;  BLOCK  . CHOLECYSTECTOMY    . COLONOSCOPY  July 2010   Dr. Arnoldo Morale: 3 rectal polyps, not enough tissue for pathologic examination, recommended surveillance in 3 years  . COLONOSCOPY N/A 10/23/2014   RMR: Multiple colonic polyps removed as described above. No endoscopic explaniation for abdominal pain. however. next tcs 10/2019  . ESOPHAGOGASTRODUODENOSCOPY  July 2010   Dr. Arnoldo Morale: gastritis and duodenitis, H.pylori negative  . ESOPHAGOGASTRODUODENOSCOPY N/A 10/23/2014   RMR: Normal EGD. Status post passage of a Maloney dilator. Today's finding s would not explain abdominal pain  . EYE SURGERY Left 01/14/2020   cataract removal  . GANGLION CYST EXCISION Left 09/07/2018   Procedure: REMOVAL GANGLION OF WRIST;  Surgeon: Carole Civil, MD;  Location: [facility];  Service: Orthopedics;  Laterality: Left;  . HERNIA REPAIR     Dr. Arnoldo Morale  . KIDNEY STONE SURGERY    . MALONEY DILATION N/A 10/23/2014   Procedure: Venia Minks DILATION;  Surgeon: Daneil Dolin, MD;  [facility];  Service: Endoscopy;  Laterality: N/A;  . OPEN REDUCTION INTERNAL FIXATION (ORIF) DISTAL RADIAL FRACTURE Right 12/09/2015   Procedure: OPEN REDUCTION INTERNAL FIXATION (ORIF) RIGHT DISTAL RADIUS;  Surgeon: Leanora Cover, MD;  Location: [facility];  Service:  Orthopedics;  Laterality: Right;    Family History  Adopted: Yes    Social History   Occupational History  . Occupation: disability  Tobacco Use  . Smoking status: Current Every Day Smoker    Packs/day: 1.50    Years: 52.00    Pack years: 78.00    Types: Cigarettes    Start date: 67  . Smokeless tobacco: Never Used  . Tobacco comment: 06/19/20- 10-12 cigarettes a day   Vaping Use  . Vaping Use: Never used  Substance and Sexual Activity  . Alcohol use: Yes    Alcohol/week: 1.0 standard drink    Types: 1 Standard drinks or equivalent per week    Comment: quit 11/03/2014. used to drink couple of 40 ounces.  . Drug use: No  . Sexual activity: Yes    Birth control/protection: Post-menopausal     Physical Exam: Blood pressure (!) 146/76, pulse 87, temperature 97.6 F (36.4 C), temperature source Temporal, height 5\' 4"  (1.626 m), weight 223 lb 3.2 oz (101.2 kg), SpO2 92 %.  Gen:      No acute distress, chronically ill appearing on oxygen Lungs:    Diminished, no wheezes, on oxygen CV:         Regular rate and rhythm; no murmurs, rubs, or gallops.  No pedal edema   Data Reviewed: Imaging: I have personally reviewed the CT Chest March 2021 with expiratory cuts showing severe EDAC.  PFTs:  PFT Results Latest Ref Rng & Units 07/14/2016 12/06/2013  FVC-Pre L 1.48 -  FVC-Predicted Pre % 45 52  FVC-Post L - 1.93  FVC-Predicted Post % - 57  Pre FEV1/FVC % % 87 82  Post FEV1/FCV % % - 82  FEV1-Pre L 1.29 1.46  FEV1-Predicted Pre % 51 55  FEV1-Post L - 1.58  DLCO uncorrected ml/min/mmHg - 14.71  DLCO UNC% % - 60  DLCO corrected ml/min/mmHg - 14.75  DLCO COR %Predicted % - 60  DLVA Predicted % - 100  TLC L - 3.73  TLC % Predicted % - 73  RV % Predicted % - 99   I have personally reviewed the patient's PFTs and they are normal without airflow limitation.  Labs: Lab Results  Component Value Date   WBC 10.4 01/23/2020   HGB 12.7 01/23/2020   HCT 39.0 01/23/2020    MCV 97.3 01/23/2020   PLT 383 01/23/2020    Immunization status: Immunization History  Administered Date(s) Administered  . Influenza Whole 03/29/2019  . Influenza-Unspecified 03/06/2019  . Moderna Sars-Covid-2 Vaccination 02/22/2020, 04/18/2020  . Pneumococcal Polysaccharide-23 07/06/2014  . Tdap 11/30/2013    Assessment:  Chronic Respiratory Failure Severe COPD FEV1 34% of predicted Tobacco Use Disorder Severe Tracheobronchomalacia Anxiety and Depression.  Plan/Recommendations: Ordered spirometry today.  Her FEV1 is 34% suggesting very severe obstruction. Will order Bipap for COPD. Bipap is necessary to prevent further disease morbidity and mortality. She has a high rate of exacerbations and this will help prevent hospitalizations and further healthcare  resource utilization.  Bipap is also necessary to help prevent death. Continue her inhalers for now.   Additional courses of steroids and antibiotics are not going to help her symptoms unless she has a true COPD exacerbation. Discussed at length symptom management including purse lipped breathing.   Continue trazodone prn for sleep  I personally spent 7 minutes counseling the patient regarding tobacco use disorder.  Patient is symptomatic from tobacco use disorder due to the following condition: COPD.  The patient's response was contemplative.  We discussed nicotine replacement therapy, Wellbutrin.  We identified to gather patient specific barriers to change.  The patient is open to future discussions about tobacco cessation.  Return to Care: Return in about 3 months (around 09/17/2020).   Lenice Llamas, MD Pulmonary and [place]

## 2020-06-20 ENCOUNTER — Other Ambulatory Visit: Payer: Self-pay | Admitting: Sports Medicine

## 2020-06-20 DIAGNOSIS — G8918 Other acute postprocedural pain: Secondary | ICD-10-CM

## 2020-06-20 NOTE — [place] Notes (Signed)
Referral placed for pain management at [facility]

## 2020-06-20 NOTE — Telephone Encounter (Signed)
Called pt and explained that she would need to be referred to pain meds. She stated that she would like to be referred.

## 2020-06-20 NOTE — Telephone Encounter (Signed)
Referral sent to [facility] for pain management

## 2020-06-22 NOTE — Telephone Encounter (Signed)
Please advise 

## 2020-06-23 ENCOUNTER — Telehealth: Payer: Self-pay | Admitting: Internal Medicine

## 2020-06-23 DIAGNOSIS — J398 Other specified diseases of upper respiratory tract: Secondary | ICD-10-CM

## 2020-06-23 NOTE — Telephone Encounter (Signed)
Spoke with the pt  She wants order for BIPAP sent to [facility] made her aware that we already sent the order to [facility] and she states only wants to use [facility]  Will forward to [facility] to see if they can send new order  Thanks!

## 2020-06-23 NOTE — Telephone Encounter (Signed)
Order sent to [facility]

## 2020-06-23 NOTE — Telephone Encounter (Signed)
Spoke with the pt  She states that [name] had mentioned to her at last ov referring her to [facility] about "putting a stent in my windpipe"  Pt states would like to proceed with referral  I do not see mention of this during last ov  Please advise, thanks!

## 2020-06-24 ENCOUNTER — Telehealth: Payer: Self-pay | Admitting: Internal Medicine

## 2020-06-24 DIAGNOSIS — J84115 Respiratory bronchiolitis interstitial lung disease: Secondary | ICD-10-CM

## 2020-06-24 DIAGNOSIS — J9611 Chronic respiratory failure with hypoxia: Secondary | ICD-10-CM

## 2020-06-24 NOTE — Telephone Encounter (Signed)
Ordered placed

## 2020-06-24 NOTE — Telephone Encounter (Signed)
Will forward to PCCs to send to another DME since [facility] doesn't have auto bipap.

## 2020-06-24 NOTE — Telephone Encounter (Signed)
Yes fine to order

## 2020-06-24 NOTE — Telephone Encounter (Signed)
[name] states they are not able to get auto BIPAP. Would need to try another DME company. [name] phone number is [phone].

## 2020-06-24 NOTE — Telephone Encounter (Signed)
Spoke with Mateo Flow at [employer]  She states that the pt is needing to have ABG on RA done as ONO on RA done to qualify her for BIPAP  Please advise if you want to order these and will then call the pt and see if she is agreeable to have this done  Thanks!

## 2020-06-24 NOTE — Telephone Encounter (Signed)
Spoke with the pt and she is agreeable to having ONO on RA and ABG  Orders placed

## 2020-06-24 NOTE — Telephone Encounter (Signed)
Dr. Shearon Stalls, please advise on if you are okay with [place] ordering ONO and ABG to be done on pt

## 2020-06-24 NOTE — Telephone Encounter (Signed)
Called and spoke with the pt and notified that referral was placed. Nothing further needed.

## 2020-06-25 ENCOUNTER — Ambulatory Visit (HOSPITAL_COMMUNITY): Payer: Medicare Other

## 2020-06-25 ENCOUNTER — Telehealth: Payer: Self-pay | Admitting: Internal Medicine

## 2020-06-25 DIAGNOSIS — J441 Chronic obstructive pulmonary disease with (acute) exacerbation: Secondary | ICD-10-CM

## 2020-06-25 DIAGNOSIS — J449 Chronic obstructive pulmonary disease, unspecified: Secondary | ICD-10-CM

## 2020-06-25 NOTE — Telephone Encounter (Signed)
Called and spoke with patient, verified that patient wears 4.5 liters of oxygen.  Order for ONO written to be done on 4.5 liters of oxygen.  Nothing further needed.

## 2020-06-26 ENCOUNTER — Ambulatory Visit: Payer: Medicaid Other | Admitting: Student

## 2020-06-30 ENCOUNTER — Telehealth: Payer: Self-pay | Admitting: Internal Medicine

## 2020-07-01 NOTE — Telephone Encounter (Signed)
Pt is needing a call back from office . Please advise

## 2020-07-01 NOTE — Telephone Encounter (Signed)
Called and spoke with pt and she stated that she spoke with [employer] and they are waiting to get an order from [place] for her oxygen to be changed over.  Will forward to [place] to make her aware.

## 2020-07-07 ENCOUNTER — Telehealth: Payer: Self-pay | Admitting: Internal Medicine

## 2020-07-07 ENCOUNTER — Other Ambulatory Visit: Payer: Medicare Other | Admitting: Adult Health

## 2020-07-07 DIAGNOSIS — J449 Chronic obstructive pulmonary disease, unspecified: Secondary | ICD-10-CM

## 2020-07-07 DIAGNOSIS — J9601 Acute respiratory failure with hypoxia: Secondary | ICD-10-CM

## 2020-07-07 NOTE — Telephone Encounter (Signed)
Orders have been updated and sent over to [facility] per pts request.

## 2020-07-07 NOTE — Telephone Encounter (Signed)
Yes ok to change

## 2020-07-07 NOTE — Telephone Encounter (Signed)
Order from 12/16 is for bipap, not oxygen.  Dr. Shearon Stalls ok to place order to switch to [facility] to deliver pt's O2 and bipap? Thanks!

## 2020-07-07 NOTE — Telephone Encounter (Signed)
See message on 12/23.

## 2020-07-10 ENCOUNTER — Telehealth: Payer: Self-pay | Admitting: Internal Medicine

## 2020-07-10 NOTE — Telephone Encounter (Signed)
Brandy from [place] called stating they received an Oxygen order (to transfer to them).  Theadora Rama stated she wanted to speak with someone about the order stating that O2 cannot be transferred at this time to them due to Medicare. Please return phone call today to [phone] ( ask for Brandy) to confirm this is taken care of for patient.

## 2020-07-10 NOTE — Telephone Encounter (Signed)
Called [facility] to speak with [name] but she was not available. Left message for her to return call.

## 2020-07-11 DIAGNOSIS — E1142 Type 2 diabetes mellitus with diabetic polyneuropathy: Secondary | ICD-10-CM | POA: Insufficient documentation

## 2020-07-15 NOTE — Telephone Encounter (Signed)
The ABG and ONO on RA have been ordered, just not scheduled yet. I called [employer] and reached the answering service. Will need to try again after 9:00 am.

## 2020-07-15 NOTE — Telephone Encounter (Signed)
[place] and was transferred to Brandi's VM- LMTCB.

## 2020-07-16 ENCOUNTER — Ambulatory Visit (HOSPITAL_COMMUNITY)
Admission: RE | Admit: 2020-07-16 | Discharge: 2020-07-16 | Disposition: A | Discharge status: Home / Self Care | Payer: Medicare Other | Source: Ambulatory Visit | Attending: Adult Health | Admitting: Adult Health

## 2020-07-16 ENCOUNTER — Other Ambulatory Visit: Payer: Self-pay

## 2020-07-16 ENCOUNTER — Telehealth: Payer: Self-pay | Admitting: Internal Medicine

## 2020-07-16 DIAGNOSIS — Z1231 Encounter for screening mammogram for malignant neoplasm of breast: Secondary | ICD-10-CM | POA: Diagnosis not present

## 2020-07-16 NOTE — Telephone Encounter (Signed)
ONO order was faxed to [facility] on 06/25/20.    AGB - Sherry LM on 06/24/20 for RT to schedule this.  I left another message for this to be scheduled.

## 2020-07-16 NOTE — Telephone Encounter (Signed)
[facility] cb# [phone] [address], [place], [place] [address]   Pts cb# [phone]

## 2020-07-16 NOTE — Telephone Encounter (Signed)
Spoke with the pt  She states needing order sent to [facility] for o2  We already sent on 07/07/20  She states also still needing ABG and ONO scheduled and these have not been set up, but orders were placed  Forwarding to [facility], thanks

## 2020-07-17 NOTE — Telephone Encounter (Signed)
Patient checking on orders for ABG and ONO. [employer] number is [phone]. Patient phone number is [phone].

## 2020-07-17 NOTE — Telephone Encounter (Signed)
Spoke with pt and reviewed PCC's message. Pt stated understanding. Nothing further needed at this time.

## 2020-07-23 ENCOUNTER — Telehealth: Payer: Self-pay | Admitting: Internal Medicine

## 2020-07-23 NOTE — Telephone Encounter (Signed)
Left message for patient to call back  

## 2020-07-28 ENCOUNTER — Other Ambulatory Visit: Payer: Self-pay

## 2020-07-28 ENCOUNTER — Other Ambulatory Visit (HOSPITAL_COMMUNITY)
Admission: RE | Admit: 2020-07-28 | Discharge: 2020-07-28 | Disposition: A | Discharge status: Home / Self Care | Payer: Medicare Other | Source: Ambulatory Visit | Attending: Internal Medicine | Admitting: Internal Medicine

## 2020-07-28 DIAGNOSIS — Z01812 Encounter for preprocedural laboratory examination: Secondary | ICD-10-CM | POA: Diagnosis present

## 2020-07-28 DIAGNOSIS — Z20822 Contact with and (suspected) exposure to covid-19: Secondary | ICD-10-CM | POA: Diagnosis not present

## 2020-07-28 LAB — SARS CORONAVIRUS 2 (TAT 6-24 HRS): SARS Coronavirus 2: NEGATIVE

## 2020-07-29 ENCOUNTER — Other Ambulatory Visit: Payer: Self-pay

## 2020-07-29 ENCOUNTER — Ambulatory Visit (HOSPITAL_COMMUNITY)
Admission: RE | Admit: 2020-07-29 | Discharge: 2020-07-29 | Disposition: A | Discharge status: Home / Self Care | Payer: Medicare Other | Source: Ambulatory Visit | Attending: Internal Medicine | Admitting: Internal Medicine

## 2020-07-29 DIAGNOSIS — J84115 Respiratory bronchiolitis interstitial lung disease: Secondary | ICD-10-CM | POA: Insufficient documentation

## 2020-07-29 DIAGNOSIS — J9611 Chronic respiratory failure with hypoxia: Secondary | ICD-10-CM | POA: Insufficient documentation

## 2020-07-29 DIAGNOSIS — J432 Centrilobular emphysema: Secondary | ICD-10-CM

## 2020-07-29 LAB — PULMONARY FUNCTION TEST
DL/VA % pred: 82 %
DL/VA: 3.45 ml/min/mmHg/L
DLCO cor % pred: 51 %
DLCO cor: 10.23 ml/min/mmHg
DLCO unc % pred: 50 %
DLCO unc: 10.04 ml/min/mmHg
FEF 25-75 Post: 1.27 L/sec
FEF 25-75 Pre: 1.13 L/sec
FEF2575-%Change-Post: 12 %
FEF2575-%Pred-Post: 59 %
FEF2575-%Pred-Pre: 53 %
FEV1-%Change-Post: 2 %
FEV1-%Pred-Post: 50 %
FEV1-%Pred-Pre: 49 %
FEV1-Post: 1.22 L
FEV1-Pre: 1.19 L
FEV1FVC-%Change-Post: -4 %
FEV1FVC-%Pred-Pre: 104 %
FEV6-%Change-Post: 7 %
FEV6-%Pred-Post: 52 %
FEV6-%Pred-Pre: 48 %
FEV6-Post: 1.58 L
FEV6-Pre: 1.47 L
FEV6FVC-%Pred-Post: 104 %
FEV6FVC-%Pred-Pre: 104 %
FVC-%Change-Post: 6 %
FVC-%Pred-Post: 49 %
FVC-%Pred-Pre: 46 %
FVC-Post: 1.58 L
FVC-Pre: 1.48 L
Post FEV1/FVC ratio: 77 %
Post FEV6/FVC ratio: 100 %
Pre FEV1/FVC ratio: 81 %
Pre FEV6/FVC Ratio: 100 %
RV % pred: 158 %
RV: 3.3 L
TLC % pred: 97 %
TLC: 4.94 L

## 2020-07-29 LAB — BLOOD GAS, ARTERIAL
Acid-Base Excess: 3.5 mmol/L — ABNORMAL HIGH (ref 0.0–2.0)
Bicarbonate: 27.4 mmol/L (ref 20.0–28.0)
FIO2: 36
O2 Saturation: 97.8 %
Patient temperature: 37
pCO2 arterial: 43 mmHg (ref 32.0–48.0)
pH, Arterial: 7.425 (ref 7.350–7.450)
pO2, Arterial: 104 mmHg (ref 83.0–108.0)

## 2020-07-29 MED ORDER — ALBUTEROL SULFATE (2.5 MG/3ML) 0.083% IN NEBU: 2.5000 mg | INHALATION_SOLUTION | Freq: Once | RESPIRATORY_TRACT | Status: DC

## 2020-07-29 MED ORDER — ALBUTEROL SULFATE (2.5 MG/3ML) 0.083% IN NEBU
2.5000 mg | INHALATION_SOLUTION | Freq: Once | RESPIRATORY_TRACT | Status: AC
  Administered 2020-07-29: 2.5 mg via RESPIRATORY_TRACT

## 2020-07-30 NOTE — Progress Notes (Signed)
Cardiology Office Note    Date:  07/31/2020   ID:  Rebekah, Peterson 09/03/1955, MRN [id number]  PCP:  Rebekah Gravel, MD  Cardiologist: Carlyle Dolly, MD    Chief Complaint  Patient presents with  . Follow-up    Overdue Visit    History of Present Illness:    [name] is a 65 y.o. female with past medical history of chronic diastolic CHF, coronary calcifications (by prior CT Imaging with low-risk NST in 2015), HTN, HLD, Type II DM and COPD who presents to the office today for overdue follow-up.  She was last examined by myself in 06/2019 and reported having baseline dyspnea on exertion and was on 4 L nasal cannula. She denied any associated chest pain or palpitations. She had previously experienced issues with lower extremity edema and was continued on PRN Torsemide.  In talking with the patient and her husband today, she continues to have dyspnea on exertion and is on 4L [place] at baseline. Is currently awaiting to hear back from Pulmonology in regards to using a BiPAP or CPAP at night if approved by her insurance. Her breathing has been at baseline and she denies any specific orthopnea, PND or edema. She does report episodes of left-sided chest pressure which radiates into her left arm. Typically occurs at rest. She did take one of her husband's SL NTG last week with resolution of her symptoms.    Past Medical History:  Diagnosis Date  . Anemia   . Anxiety   . Arthritis   . Bipolar 1 disorder ([place])   . CHF (congestive heart failure) ([place])   . COPD (chronic obstructive pulmonary disease) ([place])   . Depression   . Diabetes mellitus without complication ([place])   . Dyspnea   . Emphysema of lung ([place])   . GERD (gastroesophageal reflux disease)   . Headache    migraines  . History of hiatal hernia   . History of kidney stones   . Hyperlipidemia   . Hypertension   . Neuromuscular disorder ([facility])    neuropathy  . On home oxygen therapy   . Pneumonia 2015,  2019  . Tracheomalacia   . Vaginal Pap smear, abnormal     Past Surgical History:  Procedure Laterality Date  . CATARACT EXTRACTION W/PHACO Left 01/14/2020   Procedure: CATARACT EXTRACTION PHACO AND INTRAOCULAR LENS PLACEMENT LEFT EYE;  Surgeon: Baruch Goldmann, MD;  Location: [facility];  Service: Ophthalmology;  Laterality: Left;  CDE: 8.18  . CATARACT EXTRACTION W/PHACO Right 02/01/2020   Procedure: CATARACT EXTRACTION PHACO AND INTRAOCULAR LENS PLACEMENT RIGHT EYE;  Surgeon: Baruch Goldmann, MD;  Location: [facility];  Service: Ophthalmology;  Laterality: Right;  CDE: 9.94  . CHEILECTOMY Right 01/28/2020   Procedure: KELLER BUNION IMPLANT RIGHT FOOT CHEILECTOMY RIGHT ROOT;  Surgeon: Landis Martins, DPM;  Location: [facility];  Service: Podiatry;  Laterality: Right;  BLOCK  . CHOLECYSTECTOMY    . COLONOSCOPY  July 2010   Dr. Arnoldo Morale: 3 rectal polyps, not enough tissue for pathologic examination, recommended surveillance in 3 years  . COLONOSCOPY N/A 10/23/2014   RMR: Multiple colonic polyps removed as described above. No endoscopic explaniation for abdominal pain. however. next tcs 10/2019  . ESOPHAGOGASTRODUODENOSCOPY  July 2010   Dr. Arnoldo Morale: gastritis and duodenitis, H.pylori negative  . ESOPHAGOGASTRODUODENOSCOPY N/A 10/23/2014   RMR: Normal EGD. Status post passage of a Maloney dilator. Today's finding s would not explain abdominal pain  . EYE SURGERY Left 01/14/2020  cataract removal  . GANGLION CYST EXCISION Left 09/07/2018   Procedure: REMOVAL GANGLION OF WRIST;  Surgeon: Carole Civil, MD;  Location: [facility];  Service: Orthopedics;  Laterality: Left;  . HERNIA REPAIR     Dr. Arnoldo Morale  . KIDNEY STONE SURGERY    . MALONEY DILATION N/A 10/23/2014   Procedure: Venia Minks DILATION;  Surgeon: Daneil Dolin, MD;  Location: [facility];  Service: Endoscopy;  Laterality: N/A;  . OPEN REDUCTION INTERNAL FIXATION (ORIF) DISTAL RADIAL FRACTURE Right 12/09/2015   Procedure: OPEN REDUCTION INTERNAL  FIXATION (ORIF) RIGHT DISTAL RADIUS;  Surgeon: Leanora Cover, MD;  Location: [facility];  Service: Orthopedics;  Laterality: Right;    Current Medications: Outpatient Medications Prior to Visit  Medication Sig Dispense Refill  . Accu-Chek FastClix Lancets MISC USE TO CHECK BLOODOGLUCOSE ONCE DAILY    . ACCU-CHEK GUIDE test strip USE TO CHECK BLOODEBLUCOSE ONCE DAILY.    Marland Kitchen albuterol (PROVENTIL) (2.5 MG/3ML) 0.083% nebulizer solution Take 3 mLs (2.5 mg total) by nebulization every 4 (four) hours as needed for wheezing or shortness of breath. 180 mL 2  . albuterol-ipratropium (COMBIVENT) 18-103 MCG/ACT inhaler Inhale 2 puffs into the lungs 4 (four) times daily.     . Budeson-Glycopyrrol-Formoterol (BREZTRI AEROSPHERE) 160-9-4.8 MCG/ACT AERO Inhale 2 puffs into the lungs in the morning and at bedtime. 10.7 g 5  . cetirizine (ZYRTEC) 10 MG tablet Take 10 mg by mouth daily as needed for allergies.     . COMBIVENT RESPIMAT 20-100 MCG/ACT AERS respimat INHALE 1 PUFF INTO LUNGS EVERY 4 HOURS. 4 g 3  . cycloSPORINE (RESTASIS) 0.05 % ophthalmic emulsion Place 1 drop into both eyes 2 (two) times daily.    . diclofenac Sodium (VOLTAREN) 1 % GEL Apply 2 g topically 3 (three) times daily as needed (pain).    Marland Kitchen docusate sodium (COLACE) 100 MG capsule Take 1 capsule (100 mg total) by mouth daily as needed. 30 capsule 2  . esomeprazole (NEXIUM) 20 MG capsule Take 20 mg by mouth in the morning.     . fluticasone (FLONASE) 50 MCG/ACT nasal spray Place 1 spray into both nostrils 2 (two) times daily as needed for allergies or rhinitis. 16 g 6  . gabapentin (NEURONTIN) 800 MG tablet Take 400 mg by mouth 3 (three) times daily as needed (pain).     . ILEVRO 0.3 % ophthalmic suspension Place 1 drop into the left eye daily.    Marland Kitchen ipratropium-albuterol (DUONEB) 0.5-2.5 (3) MG/3ML SOLN Take 3 mLs by nebulization every 4 (four) hours as needed. 360 mL 5  . lisinopril (ZESTRIL) 30 MG tablet Take 30 mg by mouth  daily.     . metFORMIN (GLUCOPHAGE) 1000 MG tablet Take 1,000 mg by mouth 2 (two) times daily.    . nabumetone (RELAFEN) 750 MG tablet Take 750 mg by mouth daily as needed for moderate pain.     . nitroGLYCERIN (NITROSTAT) 0.4 MG SL tablet DISSOLVE 1 TABLET UNDER THE TONGUE EVERY 5 MINUTES IF NEEDED FOR CHEST PAIN. MAX 3 DOSES THEN CALL 911. (Patient taking differently: Place 0.4 mg under the tongue every 5 (five) minutes as needed for chest pain.) 25 tablet 3  . OXYGEN Inhale 4.5 L into the lungs continuous.     Marland Kitchen rOPINIRole (REQUIP) 1 MG tablet Take 1 mg by mouth 3 (three) times daily.     Marland Kitchen torsemide (DEMADEX) 20 MG tablet Take 1 tablet (20 mg total) by mouth daily as needed. May  take an additional 20 mg daily as needed for extreme swelling. 180 tablet 3  . simvastatin (ZOCOR) 10 MG tablet Take 5 mg by mouth daily. (Patient not taking: Reported on 07/31/2020)    . azithromycin (ZITHROMAX Z-PAK) 250 MG tablet Take 2 tablets today and then 1 tablet daily until gone. 6 each 0  . doxepin (SINEQUAN) 10 MG capsule Take 10 mg by mouth daily.    Marland Kitchen moxifloxacin (VIGAMOX) 0.5 % ophthalmic solution Place 1 drop into the left eye 3 (three) times daily.    Marland Kitchen oxyCODONE-acetaminophen (PERCOCET/ROXICET) 5-325 MG tablet TAKE (1) TABLET BY MOUTH EVERY EIGHT HOURS AS NEEDED. 21 tablet 0  . prednisoLONE acetate (PRED FORTE) 1 % ophthalmic suspension Place 1 drop into the left eye 3 (three) times daily.    . predniSONE (DELTASONE) 10 MG tablet Take 4 tabs po daily x 2 days; then 3 tabs for 2 days; then 2 tabs for 2 days; then 1 tab for 2 days 20 tablet 0  . promethazine (PHENERGAN) 12.5 MG tablet Take 1 tablet (12.5 mg total) by mouth every 6 (six) hours as needed for nausea or vomiting. 30 tablet 0  . SUMAtriptan (IMITREX) 25 MG tablet SMARTSIG:1-2 Tablet(s) By Mouth PRN    . traZODone (DESYREL) 50 MG tablet Take 1 tablet (50 mg total) by mouth at bedtime. 30 tablet 5   No facility-administered medications prior to  visit.     Allergies:   Penicillins and Septra [sulfamethoxazole-trimethoprim]   Social History   Socioeconomic History  . Marital status: Married    Spouse name: Not on file  . Number of children: Not on file  . Years of education: Not on file  . Highest education level: Not on file  Occupational History  . Occupation: disability  Tobacco Use  . Smoking status: Current Every Day Smoker    Packs/day: 0.50    Years: 52.00    Pack years: 26.00    Types: Cigarettes    Start date: 66  . Smokeless tobacco: Never Used  . Tobacco comment: 06/19/20- 10-12 cigarettes a day   Vaping Use  . Vaping Use: Never used  Substance and Sexual Activity  . Alcohol use: Not Currently    Alcohol/week: 1.0 standard drink    Types: 1 Standard drinks or equivalent per week    Comment: quit 11/03/2014. used to drink couple of 40 ounces.  . Drug use: No  . Sexual activity: Yes    Birth control/protection: Post-menopausal  Other Topics Concern  . Not on file  Social History Narrative  . Not on file   Social Determinants of Health   Financial Resource Strain: Not on file  Food Insecurity: Not on file  Transportation Needs: Not on file  Physical Activity: Not on file  Stress: Not on file  Social Connections: Not on file     Family History:  The patient's family history is not on file. She was adopted.   Review of Systems:   Please see the history of present illness.     General:  No chills, fever, night sweats or weight changes.  Cardiovascular:  No edema, orthopnea, palpitations, paroxysmal nocturnal dyspnea. Positive for chest pain and dyspnea on exertion.  Dermatological: No rash, lesions/masses Respiratory: No cough, dyspnea Urologic: No hematuria, dysuria Abdominal:   No nausea, vomiting, diarrhea, bright red blood per rectum, melena, or hematemesis Neurologic:  No visual changes, wkns, changes in mental status. All other systems reviewed and are otherwise negative except as  noted  above.   Physical Exam:    VS:  BP 118/72   Pulse 78   Ht 5\' 4"  (1.626 m)   Wt 223 lb 9.6 oz (101.4 kg)   SpO2 97%   BMI 38.38 kg/m    General: Well developed, well nourished,female appearing in no acute distress. Head: Normocephalic, atraumatic. Neck: No carotid bruits. JVD not elevated.  Lungs: Respirations regular and unlabored. Scattered rhonchi. No wheezing appreciated.   Heart: Regular rate and rhythm. No S3 or S4.  No murmur, no rubs, or gallops appreciated. Abdomen: Appears non-distended. No obvious abdominal masses. Msk:  Strength and tone appear normal for age. No obvious joint deformities or effusions. Extremities: No clubbing or cyanosis. No lower extremity edema.  Distal pedal pulses are 2+ bilaterally. Neuro: Alert and oriented X 3. Moves all extremities spontaneously. No focal deficits noted. Psych:  Responds to questions appropriately with a normal affect. Skin: No rashes or lesions noted  Wt Readings from Last 3 Encounters:  07/31/20 223 lb 9.6 oz (101.4 kg)  06/19/20 223 lb 3.2 oz (101.2 kg)  04/10/20 214 lb (97.1 kg)     Studies/Labs Reviewed:   EKG:  EKG is not ordered today. EKG from 01/11/2020 is reviewed and demonstrates NSR, HR 96 with RAD.   Recent Labs: 01/23/2020: BUN 12; Creatinine, Ser 0.39; Hemoglobin 12.7; Platelets 383; Potassium 4.0; Sodium 136   Lipid Panel    Component Value Date/Time   CHOL 197 07/18/2015 1113   TRIG 144 07/18/2015 1113   HDL 41 07/18/2015 1113   CHOLHDL 4.8 (H) 07/18/2015 1113   CHOLHDL 3.1 03/07/2014 0343   VLDL 40 03/07/2014 0343   LDLCALC 127 (H) 07/18/2015 1113    Additional studies/ records that were reviewed today include:   Echocardiogram: 06/2019 IMPRESSIONS    1. Left ventricular ejection fraction, by visual estimation, is 60 to  65%. The left ventricle has normal function. There is no left ventricular  hypertrophy.  2. The left ventricle has no regional wall motion abnormalities.  3. Global  right ventricle has normal systolic function.The right  ventricular size is normal. No increase in right ventricular wall  thickness.  4. Left atrial size was upper normal.  5. Right atrial size was normal.  6. The mitral valve is grossly normal. Trivial mitral valve  regurgitation.  7. The tricuspid valve is grossly normal. Tricuspid valve regurgitation  is trivial.  8. The aortic valve is tricuspid. Aortic valve regurgitation is not  visualized.  9. The pulmonic valve was grossly normal. Pulmonic valve regurgitation is  trivial.  10. TR signal is inadequate for assessing pulmonary artery systolic  pressure.  11. The inferior vena cava is normal in size with greater than 50%  respiratory variability, suggesting right atrial pressure of 3 mmHg.   Assessment:    1. Chest pain, unspecified type   2. Coronary artery calcification seen on CT scan   3. Essential hypertension   4. Hyperlipidemia LDL goal <70   5. Chronic obstructive pulmonary disease, unspecified COPD type ([place])      Plan:   In order of problems listed above:  1. Chest Pain with Mixed Features/Coronary Calcification by CT - Her chest pain typically occurs at rest but has radiated into her left arm at times and was previously relieved with SL NTG. Given her symptoms along with known coronary calcifications and her cardiac risk factors (HTN, HLD, Type 2 DM and continued tobacco use), will plan for a Lexiscan Myoview for  ischemic evaluation. She does have rhonchi on examination today but no wheezing. I encouraged her to use her inhaler/nebulizers the morning of her stress test as she typically does and to bring her inhaler for the stress test.   2. HTN - BP is well-controlled at 118/72 during today's visit. Continue current medication regimen with Lisinopril 30mg  daily. Only takes Torsemide if needed which is less than once every few weeks.   3. HLD - Followed by PCP. Will request most recent records. She remains  on Simvastatin 5mg  daily.   4. COPD/Tobacco Use - Followed by Pulmonology. She remains on 4L [place] at baseline. She continues to smoke 0.5 ppd and cessation was advised.    Shared Decision Making/Informed Consent:   Shared Decision Making/Informed Consent The risks [chest pain, shortness of breath, cardiac arrhythmias, dizziness, blood pressure fluctuations, myocardial infarction, stroke/transient ischemic attack, nausea, vomiting, allergic reaction, radiation exposure, metallic taste sensation and life-threatening complications (estimated to be 1 in 10,000)], benefits (risk stratification, diagnosing coronary artery disease, treatment guidance) and alternatives of a nuclear stress test were discussed in detail with Ms. Quijas and she agrees to proceed.       Medication Adjustments/Labs and Tests Ordered: Current medicines are reviewed at length with the patient today.  Concerns regarding medicines are outlined above.  Medication changes, Labs and Tests ordered today are listed in the Patient Instructions below. Patient Instructions  Medication Instructions:   Continue current medication regimen.   Labwork:  None.  Testing/Procedures:  Will plan for Lexiscan Myoview stress test to rule-out any blockages. Use your inhaler and nebulizer prior to coming for your stress test and bring your inhaler with you as well.   Follow-Up:  With Bernerd Pho, PA-C or Dr. Harl Bowie in 3 months unless stress test abnormal.   Any Other Special Instructions Will Be Listed Below (If Applicable).     If you need a refill on your cardiac medications before your next appointment, please call your pharmacy.      Signed, Erma Heritage, PA-C  07/31/2020 7:44 PM    [facility] [address] [place],  [address] Phone: [phone] Fax: [phone]

## 2020-07-31 ENCOUNTER — Ambulatory Visit (INDEPENDENT_AMBULATORY_CARE_PROVIDER_SITE_OTHER): Payer: Medicare Other | Admitting: Student

## 2020-07-31 ENCOUNTER — Other Ambulatory Visit: Payer: Self-pay

## 2020-07-31 ENCOUNTER — Encounter: Payer: Self-pay | Admitting: Student

## 2020-07-31 VITALS — BP 118/72 | HR 78 | Ht 64.0 in | Wt 223.6 lb

## 2020-07-31 DIAGNOSIS — E785 Hyperlipidemia, unspecified: Secondary | ICD-10-CM

## 2020-07-31 DIAGNOSIS — I10 Essential (primary) hypertension: Secondary | ICD-10-CM | POA: Diagnosis not present

## 2020-07-31 DIAGNOSIS — I2510 Atherosclerotic heart disease of native coronary artery without angina pectoris: Secondary | ICD-10-CM

## 2020-07-31 DIAGNOSIS — R079 Chest pain, unspecified: Secondary | ICD-10-CM

## 2020-07-31 DIAGNOSIS — J449 Chronic obstructive pulmonary disease, unspecified: Secondary | ICD-10-CM

## 2020-07-31 NOTE — Telephone Encounter (Signed)
The results have been give to Dr. Shearon Stalls.

## 2020-07-31 NOTE — Telephone Encounter (Signed)
I have reviewed the patient's ABG, overnight oximetry and PFTs. Unfortunately none of these can qualify her for BIPAP. I recommend she follow up with [facility] as scheduled to see if they have anything else they can offer. I think quitting smoking continues to be the most important intervention in getting her feeling better.

## 2020-07-31 NOTE — Patient Instructions (Signed)
Medication Instructions:   Continue current medication regimen.   Labwork:  None.  Testing/Procedures:  Will plan for Lexiscan Myoview stress test to rule-out any blockages. Use your inhaler and nebulizer prior to coming for your stress test and bring your inhaler with you as well.   Follow-Up:  With Bernerd Pho, PA-C or Dr. Harl Bowie in 3 months unless stress test abnormal.   Any Other Special Instructions Will Be Listed Below (If Applicable).     If you need a refill on your cardiac medications before your next appointment, please call your pharmacy.

## 2020-07-31 NOTE — Telephone Encounter (Addendum)
Per Mateo Flow w/ [facility], per the ONO, pt does not qualify for a bipap.  They have already faxed the ONO results to Dr. Shearon Stalls.  I could not locate that in pt's chart & have asked them to re-fax the ONO results to my fax.

## 2020-07-31 NOTE — Telephone Encounter (Signed)
AT[facility] patient no answer, Per DPR left detailed message that according to [name] patient does not qualify for Bipap. Advised her that we have asked them to refax results for Dr. Shearon Stalls to review. I will forward this message to Dr. Shearon Stalls to make her aware and to keep an eye out for results.  Dr. Shearon Stalls please advise on message from [facility] in regards to ONO and eye out for results.  [name] D 7 minutes ago (12:13 PM)       Per Mateo Flow w/ [employer], per the ONO, pt does not qualify for a bipap.  They have already faxed the ONO results to Dr. Shearon Stalls.  I could not locate that in pt's chart & have asked them to re-fax the ONO results to my fax.

## 2020-07-31 NOTE — Telephone Encounter (Signed)
Called pt and no answer, LMTCB.

## 2020-07-31 NOTE — Telephone Encounter (Signed)
Spoke with the pt and notified of response per [name]. She verbalized understanding. Will keep f/u with [facility].

## 2020-08-01 ENCOUNTER — Ambulatory Visit (INDEPENDENT_AMBULATORY_CARE_PROVIDER_SITE_OTHER): Payer: Medicare Other | Admitting: Primary Care

## 2020-08-01 ENCOUNTER — Encounter: Payer: Self-pay | Admitting: Internal Medicine

## 2020-08-01 ENCOUNTER — Telehealth: Payer: Self-pay | Admitting: Internal Medicine

## 2020-08-01 DIAGNOSIS — J441 Chronic obstructive pulmonary disease with (acute) exacerbation: Secondary | ICD-10-CM | POA: Diagnosis not present

## 2020-08-01 MED ORDER — AZITHROMYCIN 250 MG PO TABS: ORAL_TABLET | ORAL | 0 refills | Status: DC

## 2020-08-01 MED ORDER — PREDNISONE 10 MG PO TABS: ORAL_TABLET | ORAL | 0 refills | Status: DC

## 2020-08-01 NOTE — Progress Notes (Signed)
Virtual Visit via Telephone Note  I connected with [name] on 08/01/20 at  3:30 PM EST by telephone and verified that I am speaking with the correct person using two identifiers.  Location: Patient: Home Provider: Office   I discussed the limitations, risks, security and privacy concerns of performing an evaluation and management service by telephone and the availability of in person appointments. I also discussed with the patient that there may be a patient responsible charge related to this service. The patient expressed understanding and agreed to proceed.   History of Present Illness:  65 year old female, current every day smoker. PMH significant for COPD (FEV1 1.46L/ 51%), severe tacheomalacia, respiratory failure (oxygen dependent), respiratory bronchiolitis associated ILD, GERD, DM, metabolic encephalopathy, polysubstance abuse, bipolar 1 disorder, benzodiazepine dependence. [name], last seen on 06/19/20. Maintained on Breztri, as needed combivent/albuterol and supplemental oxygen.    08/01/2020 Patient reports increased wheezing x 2-3 days. She is more short of breath and reports a rattling in her chest. She does not have a cough. She is using albuterol nebulizer four times a day. BIPAP was not approved. She has an apt on Feb 8th with [facility]. She has received both covid vaccines, due for boost in April. Tested negative for covid on 07/28/20.    Observations/Objective:   - Able to speak in full sentences; no overt shortness of breath or wheezing  Assessment and Plan:  Severe obstructive lung disease - Wheezing x 2 days with increased shortness of breath. No cough.  - Continue Breztri two puff twice daily; mucinex BID and encourage daily flutter valve use - Sending in RX for prednisone 20mg  x 1 week; zpack to have on hand if she develops purulent cough or fever ONLY   Severe tracheobronchomalacia - She does not qualify for BIPAP based on ABGs, ONO and  PFTs - Patient has an apt with [facility] on 2/8  Follow Up Instructions:  - If symptoms do not improve or worsen    I discussed the assessment and treatment plan with the patient. The patient was provided an opportunity to ask questions and all were answered. The patient agreed with the plan and demonstrated an understanding of the instructions.   The patient was advised to call back or seek an in-person evaluation if the symptoms worsen or if the condition fails to improve as anticipated.  I provided 17 minutes of non-face-to-face time during this encounter.   Martyn Ehrich, NP

## 2020-08-01 NOTE — Telephone Encounter (Signed)
08/01/20  Called and spoke with patient.  She is reporting that she is using her ICS/LABA/LAMA inhaler as prescribed.  Using rescue nebs 4 times daily.  Patient requesting prednisone and antibiotics.  Last antibiotics were in December/2021 which was a Z-Pak.  Patient able to talk in complete sentences.  Patient recently tested negative for Covid on 07/28/2020.  Patient is vaccinated and awaiting booster when available to her.  Plan: Scheduled for telephone visit with EW NP today We will route to EW NP as [facility]

## 2020-08-20 ENCOUNTER — Ambulatory Visit (HOSPITAL_COMMUNITY): Payer: Medicare Other

## 2020-08-20 ENCOUNTER — Encounter (HOSPITAL_COMMUNITY): Payer: Medicare Other

## 2020-09-02 DIAGNOSIS — J9611 Chronic respiratory failure with hypoxia: Secondary | ICD-10-CM | POA: Insufficient documentation

## 2020-09-02 DIAGNOSIS — J398 Other specified diseases of upper respiratory tract: Secondary | ICD-10-CM | POA: Insufficient documentation

## 2020-09-02 DIAGNOSIS — R0989 Other specified symptoms and signs involving the circulatory and respiratory systems: Secondary | ICD-10-CM | POA: Insufficient documentation

## 2020-09-10 ENCOUNTER — Telehealth: Payer: Self-pay | Admitting: Internal Medicine

## 2020-09-10 ENCOUNTER — Other Ambulatory Visit: Payer: Medicare Other | Admitting: Adult Health

## 2020-09-10 NOTE — Telephone Encounter (Signed)
Called and spoke with Patient. Patient stated she was referred by Dr. Shearon Stalls to Dr. Kerin Ransom at [facility]. Patient stated she saw him 09/04/20, and he was sending Dr. Shearon Stalls a order to sign for Patient to be placed on cpap. Patient is to follow up with Dr. Kerin Ransom in 3 months. I checked Dr. Mauricio Po folder and progress note/plan  is in folder from Dr. Kerin Ransom. Nothing to sign at this time. I called Patient and did let her know Dr. Adair Patter office notes and plan was received.  Dr. Shearon Stalls returns in office 09/15/20. Nothing further at this time.

## 2020-09-15 ENCOUNTER — Telehealth: Payer: Self-pay | Admitting: Internal Medicine

## 2020-09-15 DIAGNOSIS — J398 Other specified diseases of upper respiratory tract: Secondary | ICD-10-CM

## 2020-09-15 NOTE — Telephone Encounter (Signed)
Call returned to patient, confirmed DOB. Patient states she was seen by [name] and was told her insurance would not cover the bi-pap and MD wanted her to try the cpap. Patient is requesting recommendations and also requesting that [name] call and speak with [name] to ensure she understands correctly.   ND please advise. Thanks

## 2020-09-16 NOTE — Telephone Encounter (Signed)
I did communicate with Dr. Kerin Ransom. He recommended that I order CPAP for her airway collapse, since she did not qualify for sleep apnea. I will order this to see her insurance covers it. If her insurance does not cover it, she needs to go back and see Dr. Kerin Ransom to discuss the stent again.

## 2020-09-16 NOTE — Telephone Encounter (Signed)
Called and spoke with patient. She is ok with the order.   Dr. Shearon Stalls, can you please advise on the settings for the cpap machine? Thanks!

## 2020-09-17 ENCOUNTER — Telehealth: Payer: Self-pay | Admitting: Primary Care

## 2020-09-17 NOTE — Telephone Encounter (Signed)
Left message for [name] to call back.

## 2020-09-17 NOTE — Telephone Encounter (Signed)
Called and spoke with patient. She is aware that the order will be placed today. She verbalized understanding. She stated that she receives her O2 supplies from [facility] but would like to receive the cpap from [place] if possible. Advised her I would try to get the order sent to [place] if possible.   Order has been placed. Nothing further needed at time of call.

## 2020-09-18 ENCOUNTER — Telehealth: Payer: Self-pay | Admitting: Internal Medicine

## 2020-09-18 NOTE — Telephone Encounter (Signed)
Re sent order to [facility] [name]

## 2020-09-18 NOTE — Telephone Encounter (Signed)
Called and spoke with patient who would like CPAP order sent to [facility]. Patient has Oxygen thru [facility] and is concerned that her insurance will not cover CPAP with [employer].   PCC's please advise.

## 2020-09-19 NOTE — Telephone Encounter (Signed)
Called and spoke with [name]. She was calling to get a copy of the sleep study results from last year as well as a copy of the OV prior to the sleep study. [name] that the patient called late yesterday and asked [place] to send the cpap order to [facility]. She will note this in the chart and if the patient decides to switch to [facility], they will take her.   Nothing further needed at time of call.

## 2020-09-26 ENCOUNTER — Telehealth: Payer: Self-pay | Admitting: Internal Medicine

## 2020-09-26 NOTE — Telephone Encounter (Signed)
Rec'd fax confirm.  Pt aware & nothing further needed at this time.

## 2020-09-26 NOTE — Telephone Encounter (Signed)
Faxed to [place] again.

## 2020-09-29 ENCOUNTER — Other Ambulatory Visit: Payer: Self-pay | Admitting: Primary Care

## 2020-10-08 ENCOUNTER — Telehealth: Payer: Self-pay | Admitting: Internal Medicine

## 2020-10-09 NOTE — Telephone Encounter (Signed)
Nothing noted in message. Will close encounter.  

## 2020-10-13 ENCOUNTER — Telehealth: Payer: Self-pay | Admitting: Internal Medicine

## 2020-10-13 MED ORDER — PREDNISONE 20 MG PO TABS: 20.0000 mg | ORAL_TABLET | Freq: Every day | ORAL | 0 refills | Status: DC

## 2020-10-13 NOTE — Telephone Encounter (Signed)
Called and spoke with patient, she states that her nose is congested, taking Zyrtec and flonase, has been taking for a while and does not seem to be helping.  She is on 4.5 L of oxygen and has a humidification bottle on the concentrator, but does not seem to help, she feels dried out.  She has tried nasal gel and saline spray as well.  She wants to know if there is something else she can try for allergies.  She says her chest is tight at times, currently still smoking 4-5 cigarettes in 24 hours, trying to quit.  She is using her inhalers and nebulizers as prescribed.  She is not blowing anything out of her nose.  When she coughs it is while with a little yellow.  Her ears are also popping.  She is asking for a prescription for a tub chair.  Dr. Ander Slade, please advise.  Thank you.

## 2020-10-13 NOTE — Telephone Encounter (Signed)
Steroids may help  Prednisone 20 daily for 7 days-it is okay to use steroids since local treatment is not helping her symptoms   We will not be providing a prescription for tub chair

## 2020-10-13 NOTE — Telephone Encounter (Signed)
I have called the pt and she is aware of AO recs.  Prednisone has been sent to her pharmacy.  Nothing further is needed.

## 2020-10-14 ENCOUNTER — Telehealth: Payer: Self-pay | Admitting: Internal Medicine

## 2020-10-14 DIAGNOSIS — J449 Chronic obstructive pulmonary disease, unspecified: Secondary | ICD-10-CM

## 2020-10-14 NOTE — Telephone Encounter (Signed)
ATC x1, left detailed message with request to return call.

## 2020-10-14 NOTE — Telephone Encounter (Signed)
Spoke with pt who states it is hard for her to stand in shower r/t wearing O2 and weakness and a shower chair would allow her to shower. Dr. Shearon Stalls could we place an DME for a shower chair for this pt?

## 2020-10-15 NOTE — Telephone Encounter (Signed)
Called and spoke with pt letting her know the info we received from [place] about insurance not approving her for CPAP and she verbalized understanding. Stated to pt that we would send this to Dr. Shearon Stalls for her to further review. Dr. Shearon Stalls is currently out of the office but will send to her so she has this to review once she returns.

## 2020-10-15 NOTE — Telephone Encounter (Signed)
Called and spoke with pt letting her know that we had sent message to Dr. Shearon Stalls about seeing if we could order shower chair for pt and stated to her that we would call her back once we heard from Dr. Shearon Stalls and she verbalized understanding.  Dr. Shearon Stalls is currently out of the office but when she returns, she will be able to see message.

## 2020-10-16 ENCOUNTER — Ambulatory Visit: Payer: Medicare Other

## 2020-10-20 NOTE — Telephone Encounter (Signed)
Rx for shower chair sent to [facility]. Coverage depends on her insurance, she would have to call to find out. CPAP is not covered for her because she doesn't have sleep apnea and her PFTs are not severe enough. Unfortunately not much more I can do about that.

## 2020-10-20 NOTE — Telephone Encounter (Signed)
I have attempted to call the pt but the pt has a VM that has not been set up.  Will try back later.

## 2020-10-21 ENCOUNTER — Ambulatory Visit: Payer: Medicare Other

## 2020-10-21 ENCOUNTER — Other Ambulatory Visit: Payer: Self-pay

## 2020-10-21 ENCOUNTER — Telehealth: Payer: Self-pay | Admitting: *Deleted

## 2020-10-21 VITALS — Ht 64.0 in | Wt 227.0 lb

## 2020-10-21 DIAGNOSIS — J449 Chronic obstructive pulmonary disease, unspecified: Secondary | ICD-10-CM

## 2020-10-21 NOTE — Telephone Encounter (Signed)
ATC pt. VM box has not been set up yet.  Pt has an appt today for 6 min walk. Will send message to Nira Conn as she will perform pt's 65mwt today. She can inform pt of the information.

## 2020-10-21 NOTE — Telephone Encounter (Signed)
ATC patient x1 regarding shower chair, left detailed message per DPR.  Will await return call.

## 2020-10-21 NOTE — Telephone Encounter (Signed)
While patient was here to qualify for her oxygen, she was asking for an antibiotic and prednisone.  She states for the past 5 days her breathing has been bad.  Her POC was on 4L pulsed and states her concentrator at home is on 4.5 L.  During her walk, she only required 2L to keep sats above 88%.  She states she has been coughing up white mucous and is congested in her chest.  At times she coughs until she vomits.  She has not taken anything for the cough or congestion.  She states she gets winded very easily.  She denies any fever, chills, body aches, sick contacts.  She has not been recently covid tested.  She is still smoking 10 cigarettes in 24 hour period.  Educated patient on quiting smoking will help with her breathing.  She states she wants to quit, but cannot do it on her own.  She is asking if Wellbutrin can be sent to her pharmacy to help her quit.  She says Dr. Shearon Stalls has mentioned sending it before, but the patient declined.  She is now ready to try the Wellbutrin.  She initially had a f/u at the beginning of May, she states she needs to be seen before then.  I pulled up the schedule and offered her an OV with Dr. Shearon Stalls tomorrow morning, but she stated she had to give transportation 3 days notice for an appointment.  The next available was on April 27th at 9 am, this appointment was scheduled with Rexene Edison NP.  Advised patient to arrive at 8:45 am.  She verbalized understanding.  Dr. Shearon Stalls, Do you want to prescribe anything prior to her appointment with Tammy next week:  Please advise.  Thank you.

## 2020-10-21 NOTE — Telephone Encounter (Signed)
Called and left detailed message per [facility] regarding shower chair.  Advised to call back and let us know whether to order is as insurance may not cover it.  Will await return call.

## 2020-10-22 ENCOUNTER — Telehealth: Payer: Self-pay | Admitting: Internal Medicine

## 2020-10-22 DIAGNOSIS — J441 Chronic obstructive pulmonary disease with (acute) exacerbation: Secondary | ICD-10-CM

## 2020-10-22 NOTE — Telephone Encounter (Signed)
ATC, left VM to call back.

## 2020-10-22 NOTE — Telephone Encounter (Signed)
Attempted to call patient, phone went straight to voicemail. Left message for patient to return call.

## 2020-10-22 NOTE — Telephone Encounter (Signed)
ATC, left VM to call back. Looks like order for shower chair has not been placed.will wait to hear from patient.

## 2020-10-22 NOTE — Telephone Encounter (Signed)
I tried to call patient, there was no answer, left VM

## 2020-10-23 MED ORDER — PREDNISONE 20 MG PO TABS: 40.0000 mg | ORAL_TABLET | Freq: Every day | ORAL | 0 refills | Status: AC

## 2020-10-23 MED ORDER — AZITHROMYCIN 250 MG PO TABS: 250.0000 mg | ORAL_TABLET | Freq: Every day | ORAL | 0 refills | Status: DC

## 2020-10-23 NOTE — Telephone Encounter (Signed)
Called and left a VM on [phone] asking her to return our call regarding her call and symptoms. Called [phone] on DPR listed as spouse, I was able to reach her spouse Kasandra Knudsen, I let him know that Dr. Shearon Stalls had sent in Prednisone and Azithromycin for her.  Advised on instructions on how to take as well.  I also advised him that we will be glad to order the shower chair for her, but wanted to make sure that she knew that it may not be covered by insurance.  He verbalized understanding.  Kasandra Knudsen stated that he would call her and ask her to call us back.  He is not sure why she is not answering, he checked her phone this morning and it was working fine.  Will await return call from Patient regarding shower chair before closing message.

## 2020-10-23 NOTE — Telephone Encounter (Signed)
LMTCB on her mobile  Called home and recording states "the call can not be completed as dialed"  Called number listed at her work phone and there was no answer and no VM set up  Called number listed as her spouse on [place]- [phone] and was able to reach her    Pt is c/o increased cough- yellow sputum, increased SOB, wheezing and chest tightness x 5 days  She states not having any f/c/s, aches  She is using her Judithann Sauger as directed and her comivent inhaler and albuterol nebs both about 4 x per day as well and zyrtec and flonase  She states that she is needing abx  Unfortunately no openings in the schedule the remainder of this wk with limited providers  She does have planned f/u scheduled with [name] for 10/29/20 Please advise thanks!  Allergies  Allergen Reactions  . Penicillins Other (See Comments)    Patient is unsure if she allergic to penicillin or septra. Patient states one or another caused "rib pain with a little breathing problem". Has patient had a PCN reaction causing immediate rash, facial/tongue/throat swelling, SOB or lightheadedness with hypotension: YES Has patient had a PCN reaction causing severe rash involving mucus membranes or skin necrosis: NO Has patient had a PCN reaction that required hospitalization: NO Has patient had a PCN reaction occurring within the last 10 years: NO  . Septra [Sulfamethoxazole-Trimethoprim] Other (See Comments)    Patient is unsure if she allergic to septra or penicillin. Patient states one or another caused "rib pain with a little breathing problem".

## 2020-10-24 NOTE — Telephone Encounter (Signed)
Please refer to encounter from 4/19 as this is a duplicate encounter.

## 2020-10-24 NOTE — Telephone Encounter (Signed)
Pt came into the office for a walk test to see if she could qualify for O2.

## 2020-10-28 ENCOUNTER — Other Ambulatory Visit: Payer: Self-pay | Admitting: Internal Medicine

## 2020-10-28 DIAGNOSIS — J449 Chronic obstructive pulmonary disease, unspecified: Secondary | ICD-10-CM

## 2020-10-28 DIAGNOSIS — J9601 Acute respiratory failure with hypoxia: Secondary | ICD-10-CM

## 2020-10-28 NOTE — Telephone Encounter (Signed)
Ok to order 

## 2020-10-28 NOTE — Telephone Encounter (Signed)
I called and spoke with pt  She is requesting that we send order for shower chair to [place]  She states that she gets winded when she takes a shower and then will sometimes feel unsteady and fears falling  Please advise if you are okay with order, thank you!

## 2020-10-28 NOTE — Telephone Encounter (Signed)
I have called and spoke with patient and let them know Dr. Shearon Stalls has approved shower chair and I have sent this order to [facility] as requested. Patient verbalized understanding and nothing further needed at this time.

## 2020-10-28 NOTE — Telephone Encounter (Signed)
Pt returning a phone call. Pt can be reached at [phone].

## 2020-10-29 ENCOUNTER — Ambulatory Visit: Payer: Medicare Other | Admitting: Adult Health

## 2020-10-30 NOTE — Telephone Encounter (Signed)
Called and spoke with patient's spouse, Kasandra Knudsen, listed on DPR, I let him know that I was calling about he shower chair to see if she wanted it ordered.  He stated that [facility] called them and said it had been approved and should be in today.  Nothing further needed.

## 2020-10-31 NOTE — Telephone Encounter (Signed)
See 10/21/20 phone note  The shower chair was ordered

## 2020-11-03 ENCOUNTER — Other Ambulatory Visit: Payer: Self-pay | Admitting: Internal Medicine

## 2020-11-03 ENCOUNTER — Ambulatory Visit: Payer: Self-pay | Admitting: Adult Health

## 2020-11-03 DIAGNOSIS — R443 Hallucinations, unspecified: Secondary | ICD-10-CM | POA: Insufficient documentation

## 2020-11-03 DIAGNOSIS — Z79899 Other long term (current) drug therapy: Secondary | ICD-10-CM | POA: Insufficient documentation

## 2020-11-03 DIAGNOSIS — F329 Major depressive disorder, single episode, unspecified: Secondary | ICD-10-CM | POA: Insufficient documentation

## 2020-11-03 DIAGNOSIS — F332 Major depressive disorder, recurrent severe without psychotic features: Secondary | ICD-10-CM | POA: Insufficient documentation

## 2020-11-04 ENCOUNTER — Ambulatory Visit: Payer: Medicare Other | Admitting: Cardiology

## 2020-11-04 ENCOUNTER — Telehealth: Payer: Self-pay | Admitting: Student

## 2020-11-04 NOTE — Progress Notes (Deleted)
Clinical Summary [name] is a 65 y.o.female  Previously followed by Dr Einar Gip. We had seen her during a hospital in admission in 2015   1. Chronic diastolic HF/Leg edema - 07/173 LVEF 60-65%, grade II diastolic dysfunction, normal RV 2015 exercise stress test negative for ischemia - had been on lasix 40mg  daily, pcp increased to 80mg    - with increase in lasix some improvement. Has actually been losing weight over the last several months, difficultly eating. - increased SOB with the swelling.  - cutting back on sodium   - due to ongoing swelling changed from lasix to torsemide    2. Leg pains - reports chronic sciatica pain - she also reports a separate pain in bilateral calves with walking better with rest - long smoking history  3. CAD by prior imaging - incidental findings by prior chest CT - prior stress test 2105 without ischemia - did not have stress test ordered in Jan by PA Strader     4. COPD - management per pcp Past Medical History:  Diagnosis Date  . Anemia   . Anxiety   . Arthritis   . Bipolar 1 disorder ([place])   . CHF (congestive heart failure) ([place])   . COPD (chronic obstructive pulmonary disease) ([place])   . Depression   . Diabetes mellitus without complication ([place])   . Dyspnea   . Emphysema of lung ([place])   . GERD (gastroesophageal reflux disease)   . Headache    migraines  . History of hiatal hernia   . History of kidney stones   . Hyperlipidemia   . Hypertension   . Neuromuscular disorder (HCC)    neuropathy  . On home oxygen therapy   . Pneumonia 2015, 2019  . Tracheomalacia   . Vaginal Pap smear, abnormal      Allergies  Allergen Reactions  . Penicillins Other (See Comments)    Patient is unsure if she allergic to penicillin or septra. Patient states one or another caused "rib pain with a little breathing problem". Has patient had a PCN reaction causing immediate rash, facial/tongue/throat swelling, SOB or  lightheadedness with hypotension: YES Has patient had a PCN reaction causing severe rash involving mucus membranes or skin necrosis: NO Has patient had a PCN reaction that required hospitalization: NO Has patient had a PCN reaction occurring within the last 10 years: NO  . Septra [Sulfamethoxazole-Trimethoprim] Other (See Comments)    Patient is unsure if she allergic to septra or penicillin. Patient states one or another caused "rib pain with a little breathing problem".     Current Outpatient Medications  Medication Sig Dispense Refill  . Accu-Chek FastClix Lancets MISC USE TO CHECK BLOODOGLUCOSE ONCE DAILY    . ACCU-CHEK GUIDE test strip USE TO CHECK BLOODEBLUCOSE ONCE DAILY.    Marland Kitchen albuterol (PROVENTIL) (2.5 MG/3ML) 0.083% nebulizer solution Take 3 mLs (2.5 mg total) by nebulization every 4 (four) hours as needed for wheezing or shortness of breath. 180 mL 2  . albuterol-ipratropium (COMBIVENT) 18-103 MCG/ACT inhaler Inhale 2 puffs into the lungs 4 (four) times daily.     Marland Kitchen azithromycin (ZITHROMAX) 250 MG tablet Take 1 tablet (250 mg total) by mouth daily. Zpack taper as directed 5 tablet 0  . Budeson-Glycopyrrol-Formoterol (BREZTRI AEROSPHERE) 160-9-4.8 MCG/ACT AERO Inhale 2 puffs into the lungs in the morning and at bedtime. 10.7 g 5  . cetirizine (ZYRTEC) 10 MG tablet Take 10 mg by mouth daily as needed for allergies.     Marland Kitchen  COMBIVENT RESPIMAT 20-100 MCG/ACT AERS respimat INHALE 1 PUFF INTO LUNGS EVERY 4 HOURS. 4 g 3  . cycloSPORINE (RESTASIS) 0.05 % ophthalmic emulsion Place 1 drop into both eyes 2 (two) times daily.    . diclofenac Sodium (VOLTAREN) 1 % GEL Apply 2 g topically 3 (three) times daily as needed (pain).    Marland Kitchen docusate sodium (COLACE) 100 MG capsule Take 1 capsule (100 mg total) by mouth daily as needed. 30 capsule 2  . esomeprazole (NEXIUM) 20 MG capsule Take 20 mg by mouth in the morning.     . fluticasone (FLONASE) 50 MCG/ACT nasal spray Place 1 spray into both nostrils 2  (two) times daily as needed for allergies or rhinitis. 16 g 6  . gabapentin (NEURONTIN) 800 MG tablet Take 400 mg by mouth 3 (three) times daily as needed (pain).     . ILEVRO 0.3 % ophthalmic suspension Place 1 drop into the left eye daily.    Marland Kitchen ipratropium-albuterol (DUONEB) 0.5-2.5 (3) MG/3ML SOLN Take 3 mLs by nebulization every 4 (four) hours as needed. 360 mL 5  . lisinopril (ZESTRIL) 30 MG tablet Take 30 mg by mouth daily.     . metFORMIN (GLUCOPHAGE) 1000 MG tablet Take 1,000 mg by mouth 2 (two) times daily.    . nabumetone (RELAFEN) 750 MG tablet Take 750 mg by mouth daily as needed for moderate pain.     . nitroGLYCERIN (NITROSTAT) 0.4 MG SL tablet DISSOLVE 1 TABLET UNDER THE TONGUE EVERY 5 MINUTES IF NEEDED FOR CHEST PAIN. MAX 3 DOSES THEN CALL 911. (Patient taking differently: Place 0.4 mg under the tongue every 5 (five) minutes as needed for chest pain.) 25 tablet 3  . OXYGEN Inhale 4.5 L into the lungs continuous.     . predniSONE (DELTASONE) 10 MG tablet Take 2 tabs x 7 days 14 tablet 0  . rOPINIRole (REQUIP) 1 MG tablet Take 1 mg by mouth 3 (three) times daily.     . simvastatin (ZOCOR) 10 MG tablet Take 5 mg by mouth daily.    Marland Kitchen torsemide (DEMADEX) 20 MG tablet Take 1 tablet (20 mg total) by mouth daily as needed. May take an additional 20 mg daily as needed for extreme swelling. 180 tablet 3   No current facility-administered medications for this visit.     Past Surgical History:  Procedure Laterality Date  . CATARACT EXTRACTION W/PHACO Left 01/14/2020   Procedure: CATARACT EXTRACTION PHACO AND INTRAOCULAR LENS PLACEMENT LEFT EYE;  Surgeon: Baruch Goldmann, MD;  Location: [facility];  Service: Ophthalmology;  Laterality: Left;  CDE: 8.18  . CATARACT EXTRACTION W/PHACO Right 02/01/2020   Procedure: CATARACT EXTRACTION PHACO AND INTRAOCULAR LENS PLACEMENT RIGHT EYE;  Surgeon: Baruch Goldmann, MD;  Location: [facility];  Service: Ophthalmology;  Laterality: Right;  CDE: 9.94  . CHEILECTOMY  Right 01/28/2020   Procedure: KELLER BUNION IMPLANT RIGHT FOOT CHEILECTOMY RIGHT ROOT;  Surgeon: Landis Martins, DPM;  Location: [facility];  Service: Podiatry;  Laterality: Right;  BLOCK  . CHOLECYSTECTOMY    . COLONOSCOPY  July 2010   Dr. Arnoldo Morale: 3 rectal polyps, not enough tissue for pathologic examination, recommended surveillance in 3 years  . COLONOSCOPY N/A 10/23/2014   RMR: Multiple colonic polyps removed as described above. No endoscopic explaniation for abdominal pain. however. next tcs 10/2019  . ESOPHAGOGASTRODUODENOSCOPY  July 2010   Dr. Arnoldo Morale: gastritis and duodenitis, H.pylori negative  . ESOPHAGOGASTRODUODENOSCOPY N/A 10/23/2014   RMR: Normal EGD. Status post passage of a Venia Minks  dilator. Today's finding s would not explain abdominal pain  . EYE SURGERY Left 01/14/2020   cataract removal  . GANGLION CYST EXCISION Left 09/07/2018   Procedure: REMOVAL GANGLION OF WRIST;  Surgeon: Carole Civil, MD;  Location: [facility];  Service: Orthopedics;  Laterality: Left;  . HERNIA REPAIR     Dr. Arnoldo Morale  . KIDNEY STONE SURGERY    . MALONEY DILATION N/A 10/23/2014   Procedure: Venia Minks DILATION;  Surgeon: Daneil Dolin, MD;  [facility];  Service: Endoscopy;  Laterality: N/A;  . OPEN REDUCTION INTERNAL FIXATION (ORIF) DISTAL RADIAL FRACTURE Right 12/09/2015   Procedure: OPEN REDUCTION INTERNAL FIXATION (ORIF) RIGHT DISTAL RADIUS;  Surgeon: Leanora Cover, MD;  Location: [facility];  Service: Orthopedics;  Laterality: Right;     Allergies  Allergen Reactions  . Penicillins Other (See Comments)    Patient is unsure if she allergic to penicillin or septra. Patient states one or another caused "rib pain with a little breathing problem". Has patient had a PCN reaction causing immediate rash, facial/tongue/throat swelling, SOB or lightheadedness with hypotension: YES Has patient had a PCN reaction causing severe rash involving mucus membranes or skin necrosis: NO Has  patient had a PCN reaction that required hospitalization: NO Has patient had a PCN reaction occurring within the last 10 years: NO  . Septra [Sulfamethoxazole-Trimethoprim] Other (See Comments)    Patient is unsure if she allergic to septra or penicillin. Patient states one or another caused "rib pain with a little breathing problem".      Family History  Adopted: Yes     Social History [name] reports that she has been smoking cigarettes. She started smoking about 53 years ago. She has a 26.00 pack-year smoking history. She has never used smokeless tobacco. [name] reports previous alcohol use of about 1.0 standard drink of alcohol per week.   Review of Systems CONSTITUTIONAL: No weight loss, fever, chills, weakness or fatigue.  HEENT: Eyes: No visual loss, blurred vision, double vision or yellow sclerae.No hearing loss, sneezing, congestion, runny nose or sore throat.  SKIN: No rash or itching.  CARDIOVASCULAR:  RESPIRATORY: No shortness of breath, cough or sputum.  GASTROINTESTINAL: No anorexia, nausea, vomiting or diarrhea. No abdominal pain or blood.  GENITOURINARY: No burning on urination, no polyuria NEUROLOGICAL: No headache, dizziness, syncope, paralysis, ataxia, numbness or tingling in the extremities. No change in bowel or bladder control.  MUSCULOSKELETAL: No muscle, back pain, joint pain or stiffness.  LYMPHATICS: No enlarged nodes. No history of splenectomy.  PSYCHIATRIC: No history of depression or anxiety.  ENDOCRINOLOGIC: No reports of sweating, cold or heat intolerance. No polyuria or polydipsia.  Marland Kitchen   Physical Examination There were no vitals filed for this visit. There were no vitals filed for this visit.  Gen: resting comfortably, no acute distress HEENT: no scleral icterus, pupils equal round and reactive, no palptable cervical adenopathy,  CV Resp: Clear to auscultation bilaterally GI: abdomen is soft, non-tender, non-distended, normal  bowel sounds, no hepatosplenomegaly MSK: extremities are warm, no edema.  Skin: warm, no rash Neuro:  no focal deficits Psych: appropriate affect   Diagnostic Studies  09/2018 echo IMPRESSIONS   1. The left ventricle has normal systolic function with an ejection fraction of 60-65%. The cavity size was normal. There is mild concentric left ventricular hypertrophy. Left ventricular diastolic Doppler parameters are consistent with  pseudonormalization. Elevated left ventricular end-diastolic pressure No evidence of left ventricular regional wall motion abnormalities. 2.  The right ventricle has normal systolic function. The cavity was normal. There is no increase in right ventricular wall thickness. 3. The tricuspid valve is grossly normal. 4. The aortic valve is tricuspid. 5. The aortic root is normal in size and structure.   2015 exercise stress test negative for ischemia   Assessment and Plan  1. Leg edema/SOB/Chronic diastolic HF - significant worsening over the last few months. Limited response to increased lasix dosing. - repeat echo due to clinical chagne - d/c lasix, start torsemide 20mg  daily, may take additional 20mg  prn for severe swelling.   2. Leg pains - certain large component of sciatica. Some exertional calf pains that could be claudication, long smoking history. Obtain ABIs      Arnoldo Lenis, M.D., F.A.C.C.

## 2020-11-04 NOTE — Telephone Encounter (Signed)
Patient was notified of Myoview instructions. Instructions were also mailed to patient.

## 2020-11-04 NOTE — Telephone Encounter (Signed)
Patient called in regards to an upcoming [place] testing.  She needs to have instructions

## 2020-11-04 NOTE — Telephone Encounter (Signed)
Number given to call patient back states not in service [phone]) .I left message to return call on cell.

## 2020-11-05 ENCOUNTER — Other Ambulatory Visit: Payer: Self-pay

## 2020-11-05 ENCOUNTER — Encounter: Payer: Self-pay | Admitting: Internal Medicine

## 2020-11-05 ENCOUNTER — Ambulatory Visit (INDEPENDENT_AMBULATORY_CARE_PROVIDER_SITE_OTHER): Payer: 59 | Admitting: Internal Medicine

## 2020-11-05 VITALS — BP 138/70 | HR 79 | Ht 64.0 in | Wt 225.0 lb

## 2020-11-05 DIAGNOSIS — J9611 Chronic respiratory failure with hypoxia: Secondary | ICD-10-CM | POA: Diagnosis not present

## 2020-11-05 DIAGNOSIS — J398 Other specified diseases of upper respiratory tract: Secondary | ICD-10-CM

## 2020-11-05 DIAGNOSIS — J449 Chronic obstructive pulmonary disease, unspecified: Secondary | ICD-10-CM

## 2020-11-05 DIAGNOSIS — F17200 Nicotine dependence, unspecified, uncomplicated: Secondary | ICD-10-CM | POA: Diagnosis not present

## 2020-11-05 MED ORDER — HYDROXYZINE PAMOATE 50 MG PO CAPS: 50.0000 mg | ORAL_CAPSULE | Freq: Three times a day (TID) | ORAL | 0 refills | Status: DC | PRN

## 2020-11-05 NOTE — Progress Notes (Signed)
[name]    [id number]    [birth year]  Primary Care Physician:Kim, Jeneen Rinks, MD Date of Appointment: 11/05/2020 Established Patient Visit  Chief complaint:   Chief Complaint  Patient presents with  . Follow-up     HPI: Previous patient of Dr. Luan Pulling for COPD. Has been on oxygen since 2018. years. 4-5LNC.  FEV1 51% of predicted 1.46L in 2018. She turns up oxygen when she gets short of breath.   Ongoing tobacco use disorder, Still 1ppd.  Uses combivent and pro-air in total 6-7 times/day.  Ongoing issues with chronic pain and anxiety, trouble sleeping. Dyspnea with minimal exertion at home. Last hospitalization in 2019. Dr. Luan Pulling had her on prednisone frequently, she would get solumedrol shots. High symptom burden  Interval Updates: Having good days and bad days. Saw Dr. Kerin Ransom at [facility] for a stent. Reviewed records. He recommended getting PAP therapy but we were unable to qualify with sleep study and spirometry. Only other option is buying one.   She is still smoking.  Asking about anitbiotics and steroids. We disucssed risks and benefits.  I have reviewed the patient's family social and past medical history and updated as appropriate.   Past Medical History:  Diagnosis Date  . Anemia   . Anxiety   . Arthritis   . Bipolar 1 disorder ([place])   . CHF (congestive heart failure) ([place])   . COPD (chronic obstructive pulmonary disease) ([place])   . Depression   . Diabetes mellitus without complication ([place])   . Dyspnea   . Emphysema of lung ([place])   . GERD (gastroesophageal reflux disease)   . Headache    migraines  . History of hiatal hernia   . History of kidney stones   . Hyperlipidemia   . Hypertension   . Neuromuscular disorder (HCC)    neuropathy  . On home oxygen therapy   . Pneumonia 2015, 2019  . Tracheomalacia   . Vaginal Pap smear, abnormal     Past Surgical History:  Procedure Laterality Date  . CATARACT EXTRACTION W/PHACO Left 01/14/2020    Procedure: CATARACT EXTRACTION PHACO AND INTRAOCULAR LENS PLACEMENT LEFT EYE;  Surgeon: Baruch Goldmann, MD;  [facility];  Service: Ophthalmology;  Laterality: Left;  CDE: 8.18  . CATARACT EXTRACTION W/PHACO Right 02/01/2020   Procedure: CATARACT EXTRACTION PHACO AND INTRAOCULAR LENS PLACEMENT RIGHT EYE;  Surgeon: Baruch Goldmann, MD;  [facility];  Service: Ophthalmology;  Laterality: Right;  CDE: 9.94  . CHEILECTOMY Right 01/28/2020   Procedure: KELLER BUNION IMPLANT RIGHT FOOT CHEILECTOMY RIGHT ROOT;  Surgeon: Landis Martins, DPM;  Location: [facility];  Service: Podiatry;  Laterality: Right;  BLOCK  . CHOLECYSTECTOMY    . COLONOSCOPY  July 2010   Dr. Arnoldo Morale: 3 rectal polyps, not enough tissue for pathologic examination, recommended surveillance in 3 years  . COLONOSCOPY N/A 10/23/2014   RMR: Multiple colonic polyps removed as described above. No endoscopic explaniation for abdominal pain. however. next tcs 10/2019  . ESOPHAGOGASTRODUODENOSCOPY  July 2010   Dr. Arnoldo Morale: gastritis and duodenitis, H.pylori negative  . ESOPHAGOGASTRODUODENOSCOPY N/A 10/23/2014   RMR: Normal EGD. Status post passage of a Maloney dilator. Today's finding s would not explain abdominal pain  . EYE SURGERY Left 01/14/2020   cataract removal  . GANGLION CYST EXCISION Left 09/07/2018   Procedure: REMOVAL GANGLION OF WRIST;  Surgeon: Carole Civil, MD;  [facility];  Service: Orthopedics;  Laterality: Left;  . HERNIA REPAIR  Dr. Arnoldo Morale  . KIDNEY STONE SURGERY    . MALONEY DILATION N/A 10/23/2014   Procedure: Venia Minks DILATION;  Surgeon: Daneil Dolin, MD;  [facility];  Service: Endoscopy;  Laterality: N/A;  . OPEN REDUCTION INTERNAL FIXATION (ORIF) DISTAL RADIAL FRACTURE Right 12/09/2015   Procedure: OPEN REDUCTION INTERNAL FIXATION (ORIF) RIGHT DISTAL RADIUS;  Surgeon: Leanora Cover, MD;  Location: [facility];  Service: Orthopedics;  Laterality: Right;    Family History   Adopted: Yes    Social History   Occupational History  . Occupation: disability  Tobacco Use  . Smoking status: Current Every Day Smoker    Packs/day: 0.50    Years: 52.00    Pack years: 26.00    Types: Cigarettes    Start date: 46  . Smokeless tobacco: Never Used  . Tobacco comment: 06/19/20- 10-12 cigarettes a day   Vaping Use  . Vaping Use: Never used  Substance and Sexual Activity  . Alcohol use: Not Currently    Alcohol/week: 1.0 standard drink    Types: 1 Standard drinks or equivalent per week    Comment: quit 11/03/2014. used to drink couple of 40 ounces.  . Drug use: No  . Sexual activity: Yes    Birth control/protection: Post-menopausal     Physical Exam: Blood pressure 138/70, pulse 79, height 5\' 4"  (1.626 m), weight 225 lb (102.1 kg), SpO2 96 %.  Gen:    NAD, on oxygen Lungs:    Diminished, mild end expiratory wheezes best auscultated over anterior neck CV:         RRR no mrg, no edema  Data Reviewed: Imaging: I have personally reviewed the CT Chest March 2021 with expiratory cuts showing severe EDAC.  PFTs:  PFT Results Latest Ref Rng & Units 07/29/2020 07/14/2016 12/06/2013  FVC-Pre L 1.48 1.48 -  FVC-Predicted Pre % 46 45 52  FVC-Post L 1.58 - 1.93  FVC-Predicted Post % 49 - 57  Pre FEV1/FVC % % 81 87 82  Post FEV1/FCV % % 77 - 82  FEV1-Pre L 1.19 1.29 1.46  FEV1-Predicted Pre % 49 51 55  FEV1-Post L 1.22 - 1.58  DLCO uncorrected ml/min/mmHg 10.04 - 14.71  DLCO UNC% % 50 - 60  DLCO corrected ml/min/mmHg 10.23 - 14.75  DLCO COR %Predicted % 51 - 60  DLVA Predicted % 82 - 100  TLC L 4.94 - 3.73  TLC % Predicted % 97 - 73  RV % Predicted % 158 - 99   I have personally reviewed the patient's PFTs and they are normal without airflow limitation.  Labs: Lab Results  Component Value Date   WBC 10.4 01/23/2020   HGB 12.7 01/23/2020   HCT 39.0 01/23/2020   MCV 97.3 01/23/2020   PLT 383 01/23/2020    Immunization status: Immunization History   Administered Date(s) Administered  . Influenza Whole 03/29/2019  . Influenza-Unspecified 03/06/2019  . Moderna Sars-Covid-2 Vaccination 02/22/2020, 04/18/2020  . Pneumococcal Polysaccharide-23 07/06/2014  . Tdap 11/30/2013    Assessment:  Chronic Respiratory Failure Severe COPD FEV1 34% of predicted Tobacco Use Disorder Severe Tracheobronchomalacia Anxiety  Plan/Recommendations: Continue inhaler therapy with duoneb, Breztri, albuterol prn. I have referred her to pulmonary rehab for chronic respiratory failure. Discussed smoking cessation.   Discussed signs of exacerbation such as lower O2 sat on 4-5LNC not relieved by bronchodilators, sputum production, fevers, chest pain.  She is asking for vistaril prn for anxiety - I have agreed to a one month  supply until she can discuss further with PCP.   I spent 30 minutes in the care of this patient today including pre-charting, chart review, review of results, face-to-face care, coordination of care and communication with consultants etc.).  Return to Care: Return in about 4 months (around 03/08/2021).   Lenice Llamas, MD Pulmonary and [place]

## 2020-11-05 NOTE — Patient Instructions (Signed)
The patient should have follow up scheduled with myself in 4 months.   I am referring you to pulmonary rehab for your breathing at [facility]. Please call us if you haven't heard in a week.

## 2020-11-11 ENCOUNTER — Encounter (HOSPITAL_COMMUNITY): Payer: Medicare Other

## 2020-11-11 ENCOUNTER — Other Ambulatory Visit: Payer: Self-pay | Admitting: Internal Medicine

## 2020-11-11 DIAGNOSIS — J449 Chronic obstructive pulmonary disease, unspecified: Secondary | ICD-10-CM

## 2020-11-11 DIAGNOSIS — F17200 Nicotine dependence, unspecified, uncomplicated: Secondary | ICD-10-CM

## 2020-11-13 ENCOUNTER — Ambulatory Visit: Payer: Self-pay | Admitting: Adult Health

## 2020-11-14 ENCOUNTER — Telehealth: Payer: Self-pay | Admitting: Internal Medicine

## 2020-11-14 DIAGNOSIS — J449 Chronic obstructive pulmonary disease, unspecified: Secondary | ICD-10-CM

## 2020-11-14 NOTE — Telephone Encounter (Signed)
Lm for patient.  

## 2020-11-14 NOTE — Telephone Encounter (Signed)
Patient is returning phone call. Patient phone number is [phone].

## 2020-11-14 NOTE — Telephone Encounter (Signed)
LMTCB

## 2020-11-17 ENCOUNTER — Encounter (HOSPITAL_COMMUNITY): Payer: Medicare Other

## 2020-11-17 NOTE — Telephone Encounter (Signed)
Called and spoke with pt who is needing a new nebulizer machine as hers is not working as effective so she does not feel like she is getting good use out of it when doing breathing treatments. Pt believes that her current one is at least 66 years old.  Dr. Shearon Stalls, please advise if you are okay with [place] placing an order for pt to receive a new nebulizer machine.

## 2020-11-18 ENCOUNTER — Encounter (HOSPITAL_COMMUNITY): Payer: 59

## 2020-11-18 ENCOUNTER — Encounter (HOSPITAL_COMMUNITY): Payer: Medicare Other

## 2020-11-18 NOTE — Telephone Encounter (Signed)
Order has been placed to [facility] for nebulizer machine.  Patient is aware and voiced her understanding.  Nothing further needed at this time.

## 2020-11-18 NOTE — Telephone Encounter (Signed)
Ok to prescribe nebulizer

## 2020-11-19 ENCOUNTER — Telehealth: Payer: Self-pay | Admitting: Internal Medicine

## 2020-11-19 NOTE — Telephone Encounter (Signed)
Called and spoke with patient, advised of recommendations per Eric Form NP, she is seeing per PCP tomorrow for a 6 months check up.  There are no openings today for a televisit or tomorrow.  Scheduled patient to see Tammy in the office on Monday 5/23 (d/t needing 3 days notice for transportation).  Nothing further needed.

## 2020-11-19 NOTE — Telephone Encounter (Signed)
Called and spoke with patient who states she is wheezing and having some chest tightness and having some shortness of breath and mostly dry cough. Has not had Covid 19 test in past 5 days. Pharmacy is [facility]. Waiting for [facility] Dr. to schedule appointment. States that she did breathing treatment about 3 hours and used her inhalers this morning. Patient is still smoking as well.  Sarah please advise as Dr. Shearon Stalls is off all week

## 2020-11-19 NOTE — Telephone Encounter (Signed)
Does anyone have an opening for a tele visit today? I do not, but wasn't sure if a doc might. If no appointments today, then will need televisit tomorrow . If she gets worse overnight, she needs to seek emergency care.  She needs to stop smoking while she is having a flare

## 2020-11-24 ENCOUNTER — Ambulatory Visit: Payer: 59 | Admitting: Adult Health

## 2020-11-28 ENCOUNTER — Other Ambulatory Visit: Payer: Self-pay | Admitting: Internal Medicine

## 2020-11-29 DIAGNOSIS — R300 Dysuria: Secondary | ICD-10-CM | POA: Insufficient documentation

## 2020-12-02 ENCOUNTER — Encounter (HOSPITAL_COMMUNITY): Payer: 59 | Attending: Student

## 2020-12-02 ENCOUNTER — Encounter (HOSPITAL_COMMUNITY): Payer: 59

## 2020-12-11 ENCOUNTER — Telehealth: Payer: Self-pay | Admitting: Cardiology

## 2020-12-11 ENCOUNTER — Telehealth: Payer: Self-pay | Admitting: Internal Medicine

## 2020-12-11 ENCOUNTER — Other Ambulatory Visit: Payer: Self-pay

## 2020-12-11 ENCOUNTER — Other Ambulatory Visit: Payer: Self-pay | Admitting: Student

## 2020-12-11 DIAGNOSIS — I2510 Atherosclerotic heart disease of native coronary artery without angina pectoris: Secondary | ICD-10-CM

## 2020-12-11 DIAGNOSIS — R079 Chest pain, unspecified: Secondary | ICD-10-CM

## 2020-12-11 DIAGNOSIS — J441 Chronic obstructive pulmonary disease with (acute) exacerbation: Secondary | ICD-10-CM

## 2020-12-11 MED ORDER — PREDNISONE 20 MG PO TABS: 40.0000 mg | ORAL_TABLET | Freq: Every day | ORAL | 0 refills | Status: AC

## 2020-12-11 NOTE — Telephone Encounter (Signed)
Called and spoke with pt who is requesting meds to be sent in. Pt said she is coughing getting up yellow phlegm and also is wheezing a lot.  Pt is scheduled for surgery 6/15 and had bloodwork done for preop visit in [place], [place] yesterday 6/8 which showed WBC's elevated. Due to this, they are wanting to see if they can get things cleared up prior to them doing the surgery.  Pt has not had any fever.  Pt is requesting abx as well as prednisone to try to get things cleared up. Dr. Shearon Stalls, please advise.

## 2020-12-11 NOTE — Telephone Encounter (Signed)
need new order for Myocardial perfusion imaging. We will also need an attestation signed by [place] prior to test.

## 2020-12-11 NOTE — Telephone Encounter (Signed)
Order changed to reflect procedure to be done at [facility]. APP signed attestation.

## 2020-12-11 NOTE — Telephone Encounter (Signed)
Called and spoke with pt letting her know that ND sent Rx for prednisone to pharmacy for her and she verbalized understanding. Nothing further needed.

## 2020-12-12 ENCOUNTER — Ambulatory Visit (HOSPITAL_COMMUNITY): Admission: RE | Admit: 2020-12-12 | Payer: 59 | Source: Ambulatory Visit | Attending: Student | Admitting: Student

## 2020-12-19 ENCOUNTER — Telehealth: Payer: Self-pay | Admitting: Internal Medicine

## 2020-12-19 DIAGNOSIS — J441 Chronic obstructive pulmonary disease with (acute) exacerbation: Secondary | ICD-10-CM

## 2020-12-19 MED ORDER — AZITHROMYCIN 250 MG PO TABS: 250.0000 mg | ORAL_TABLET | Freq: Every day | ORAL | 0 refills | Status: DC

## 2020-12-19 NOTE — Telephone Encounter (Signed)
Had bronch done at [facility] - severe TBM and they were initially unable to intubate past it with the scope. BAL was sent with cultures - I am unable to review the results.

## 2020-12-19 NOTE — Telephone Encounter (Signed)
Called and spoke with patient. She is aware that the zpak has been sent in for her. Verbalized understanding.   Nothing further needed at time of call.

## 2020-12-19 NOTE — Telephone Encounter (Signed)
Pt was prescribed prednisone last week, and it is still not helping her. States that she started to feel better but is back to feeling bad and believes she's getting pnumonia. Also states that her nebulizer isn't helping like it normally does. Is curious is she can be prescribed an antiobiotic. Please advise.

## 2020-12-19 NOTE — Telephone Encounter (Signed)
Called and spoke with patient. She stated that finished the prednisone taper that was sent in on 12/11/20. She did state that the cough had started to go away but once she finished the taper 2 days ago, it has since come back. She has a productive cough with yellow phlegm. Denies any fevers, chills or chest pain. Did state that her chest feels tight.   She is still using her Judithann Sauger, DuoNeb solution as needed. But she feels the nebulizer solution is not helping as it done did.   She did go ahead and complete the bronch at [facility] on 12/17/20.   She is requesting an antibiotic since she believes this is starting to turn into PNA.   Pharmacy is [facility].   Dr. Shearon Stalls, can you please advise? Thanks!

## 2020-12-22 ENCOUNTER — Encounter (HOSPITAL_COMMUNITY): Payer: 59

## 2020-12-31 ENCOUNTER — Encounter: Payer: Self-pay | Admitting: Adult Health

## 2020-12-31 ENCOUNTER — Ambulatory Visit (INDEPENDENT_AMBULATORY_CARE_PROVIDER_SITE_OTHER): Payer: 59 | Admitting: Adult Health

## 2020-12-31 ENCOUNTER — Other Ambulatory Visit (HOSPITAL_COMMUNITY)
Admission: RE | Admit: 2020-12-31 | Discharge: 2020-12-31 | Disposition: A | Discharge status: Home / Self Care | Payer: 59 | Source: Ambulatory Visit | Attending: Adult Health | Admitting: Adult Health

## 2020-12-31 ENCOUNTER — Other Ambulatory Visit: Payer: Self-pay

## 2020-12-31 VITALS — BP 117/73 | HR 81 | Ht 64.0 in | Wt 224.0 lb

## 2020-12-31 DIAGNOSIS — Z01419 Encounter for gynecological examination (general) (routine) without abnormal findings: Secondary | ICD-10-CM | POA: Insufficient documentation

## 2020-12-31 DIAGNOSIS — Z1211 Encounter for screening for malignant neoplasm of colon: Secondary | ICD-10-CM | POA: Insufficient documentation

## 2020-12-31 DIAGNOSIS — Z1151 Encounter for screening for human papillomavirus (HPV): Secondary | ICD-10-CM | POA: Diagnosis not present

## 2020-12-31 LAB — HEMOCCULT GUIAC POC 1CARD (OFFICE): Fecal Occult Blood, POC: NEGATIVE
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
## 2021-01-02 LAB — CYTOLOGY - PAP
Comment: NEGATIVE
Diagnosis: NEGATIVE
High risk HPV: NEGATIVE

## 2021-01-07 ENCOUNTER — Telehealth: Payer: Self-pay | Admitting: Internal Medicine

## 2021-01-07 MED ORDER — PREDNISONE 10 MG PO TABS: ORAL_TABLET | ORAL | 0 refills | Status: AC

## 2021-01-07 NOTE — Telephone Encounter (Signed)
Primary Pulmonologist: ND Last office visit and with whom: 11/05/20 with ND What do we see them for (pulmonary problems): COPD Last OV assessment/plan: Assessment:  Chronic Respiratory Failure Severe COPD FEV1 34% of predicted Tobacco Use Disorder Severe Tracheobronchomalacia Anxiety   Plan/Recommendations: Continue inhaler therapy with duoneb, Breztri, albuterol prn. I have referred her to pulmonary rehab for chronic respiratory failure. Discussed smoking cessation.   Discussed signs of exacerbation such as lower O2 sat on 4-5LNC not relieved by bronchodilators, sputum production, fevers, chest pain.   She is asking for vistaril prn for anxiety - I have agreed to a one month supply until she can discuss further with PCP.    I spent 30 minutes in the care of this patient today including pre-charting, chart review, review of results, face-to-face care, coordination of care and communication with consultants etc.).   Return to Care: Return in about 4 months (around 03/08/2021).  Was appointment offered to patient (explain)?  Patient wanted recommendations.    Reason for call: Called and spoke with patient. She stated that her SOB has increased over the 3 days, especially with exertion. She is still using her 4L of O2. She believes she is having a COPD exacerbation due to the increased humidity and heat. She denied any fevers, body aches or being around anyone sick recently. She does have a productive cough, phlegm has been white. Increased wheezing.   She is still using her Breztri twice daily as well as Combivent as needed.    (examples of things to ask: : When did symptoms start? Fever? Cough? Productive? Color to sputum? More sputum than usual? Wheezing? Have you needed increased oxygen? Are you taking your respiratory medications? What over the counter measures have you tried?)  Allergies  Allergen Reactions   Penicillins Other (See Comments)    Patient is unsure if she allergic  to penicillin or septra. Patient states one or another caused "rib pain with a little breathing problem". Has patient had a PCN reaction causing immediate rash, facial/tongue/throat swelling, SOB or lightheadedness with hypotension: YES Has patient had a PCN reaction causing severe rash involving mucus membranes or skin necrosis: NO Has patient had a PCN reaction that required hospitalization: NO Has patient had a PCN reaction occurring within the last 10 years: NO   Septra [Sulfamethoxazole-Trimethoprim] Other (See Comments)    Patient is unsure if she allergic to septra or penicillin. Patient states one or another caused "rib pain with a little breathing problem".    Immunization History  Administered Date(s) Administered   Influenza Whole 03/29/2019   Influenza-Unspecified 03/06/2019   Moderna Sars-Covid-2 Vaccination 02/22/2020, 04/18/2020   Pneumococcal Polysaccharide-23 07/06/2014   Tdap 11/30/2013    She is requesting a prednisone taper. Pharmacy is [facility].   SG, can you please advise since Dr. Shearon Stalls is not available?

## 2021-01-07 NOTE — Telephone Encounter (Signed)
OK to send in prednisone taper as long as she does not have a fever. Prednisone taper; 10 mg tablets: 4 tabs x 2 days, 3 tabs x 2 days, 2 tabs x 2 days 1 tab x 2 days then stop.  If shortness of breath does not improve, she needs to seek emergency care.  She should Covid Test at home to ensure this is not the reason for shortness of breath.

## 2021-01-07 NOTE — Telephone Encounter (Signed)
Call returned to patient, confirmed DOB. Made aware of SG recommendations. Voiced understanding. Confirmed pharmacy. Denies fever. Medication sent in. Patient states she will go ahead and take the covid test to be on the safe side.   Nothing further needed at this time.

## 2021-01-12 ENCOUNTER — Telehealth: Payer: Self-pay | Admitting: Internal Medicine

## 2021-01-12 ENCOUNTER — Telehealth: Payer: Self-pay | Admitting: Adult Health

## 2021-01-12 MED ORDER — PROAIR HFA 108 (90 BASE) MCG/ACT IN AERS: 1.0000 | INHALATION_SPRAY | Freq: Four times a day (QID) | RESPIRATORY_TRACT | 12 refills | Status: DC | PRN

## 2021-01-12 NOTE — Telephone Encounter (Signed)
Proair 

## 2021-01-12 NOTE — Telephone Encounter (Signed)
[name] says she is feeling hot then cold in face and hands, had labs bu not results yet, call PCP. She asked about estrogen, I would not start it at 28, due to her other issues, could increase risk for MI or stroke

## 2021-01-12 NOTE — Telephone Encounter (Signed)
Pt requesting something for hot flashes  Please advise & call pt    [facility]

## 2021-01-12 NOTE — Telephone Encounter (Signed)
Call returned to patient, confirmed DOB. Requesting refill of Pro-air. She states she used to take Pro-Air and she feels it is more effective as far as fast acting inhalers. She states she found a pro-air in her home from a old prescription and she felt like it really opened her lungs and helped her breath better. She is requesting we send in  pro-air. She states she is using the combivent every 4-6 hours prn and the breztri every 12 hours but sometimes her lungs feel stiff and the pro-air really helped.   ND please advise if okay to send in pro-air.

## 2021-01-12 NOTE — Telephone Encounter (Signed)
Called and spoke with patient  Refill sent in verified pharmacy  Patient voiced understanding Nothing further needed at this time.

## 2021-01-15 ENCOUNTER — Telehealth: Payer: Self-pay | Admitting: Internal Medicine

## 2021-01-15 NOTE — Telephone Encounter (Signed)
Spoke to patient, who is requesting a cough syrup.  C/o prod cough with white sputum, wheezing and sob with exertion. Sx have been present for 3-4d. Denied fever, chills or sweats.  She is currently on prednisone taper.  Using albuterol HFA QID, duoneb BID and Breztri BID and combivent TID. Fully vaccinated against covid and flu.  Negative home covid test three days ago.  She wears 4L cont. Spo2 is maintaining around 92-94%.   Dr. Shearon Stalls, please advise.

## 2021-01-15 NOTE — Telephone Encounter (Signed)
Patient is aware of recommendations and voiced her understanding.   She would like to switch to the [place] office.   Dr. Shearon Stalls and Dr. Melvyn Novas, please advise if okay to switch?

## 2021-01-15 NOTE — Telephone Encounter (Signed)
Sorry I cannot call in anything further because we have already been managing a lot over the phone without any improvement. She will need an appointment. If needing more oxygen than her baseline can go to the ED.

## 2021-01-15 NOTE — Telephone Encounter (Signed)
Ok with me 

## 2021-01-15 NOTE — Telephone Encounter (Signed)
ND please advise there are not an appt available with you or an appt until August. This is both here and at the [place] locations. Thanks :)

## 2021-01-15 NOTE — Telephone Encounter (Signed)
Yes I think that would be more convenient for her - I have offered her that in the past. Ok to be seen in [place] by me.

## 2021-01-15 NOTE — Telephone Encounter (Signed)
Spoke with the pt and scheduled appt with Dr Melvyn Novas in [place] for 03/03/21- notified of location

## 2021-01-28 ENCOUNTER — Other Ambulatory Visit: Payer: Self-pay | Admitting: Internal Medicine

## 2021-01-28 ENCOUNTER — Telehealth: Payer: Self-pay | Admitting: Internal Medicine

## 2021-01-29 ENCOUNTER — Other Ambulatory Visit: Payer: Self-pay | Admitting: Internal Medicine

## 2021-01-29 NOTE — Telephone Encounter (Signed)
Breztri refilled.  Please advise on hydroxyzine.

## 2021-01-29 NOTE — Telephone Encounter (Signed)
Nothing noted in message. Will close encounter.  

## 2021-01-30 MED ORDER — HYDROXYZINE PAMOATE 50 MG PO CAPS: 50.0000 mg | ORAL_CAPSULE | Freq: Three times a day (TID) | ORAL | 0 refills | Status: DC | PRN

## 2021-02-10 NOTE — Telephone Encounter (Signed)
Lmtcb for pt.   Hydroxyzine has been sent to pharmacy.

## 2021-02-13 ENCOUNTER — Telehealth: Payer: Self-pay | Admitting: Internal Medicine

## 2021-02-13 MED ORDER — DOXYCYCLINE HYCLATE 100 MG PO TABS: 100.0000 mg | ORAL_TABLET | Freq: Two times a day (BID) | ORAL | 0 refills | Status: AC

## 2021-02-13 NOTE — Telephone Encounter (Signed)
Call made to patient, confirmed DOB. Patient reports increased SOB with productive cough with green mucous over last week and a half. Denies wheezing. She confirms she is using her breztri daily and albuterol nebulizer 4-5x/day. She uses 4L of oxygen she reports her oxygen saturations have 95% and higher. Denies fever, chills, sore throat, or body aches. She has not tried anything OTC to address any of the symptoms.  Last OV:11/05/20 Bronch: 12/17/20  Patient has an appt scheduled 8/30 with Dr Melvyn Novas. Nothing sooner with App or MD.   ND please advise. Thanks :)

## 2021-02-13 NOTE — Telephone Encounter (Signed)
Called and spoke with Patient.  Advise Patient Dr. Shearon Stalls sent Doxycycline prescription to requested pharmacy.  Understanding stated.  Nothing further at this time.

## 2021-02-13 NOTE — Telephone Encounter (Signed)
Sent to pharmacy 

## 2021-02-17 DIAGNOSIS — E538 Deficiency of other specified B group vitamins: Secondary | ICD-10-CM | POA: Diagnosis not present

## 2021-02-17 DIAGNOSIS — G44009 Cluster headache syndrome, unspecified, not intractable: Secondary | ICD-10-CM | POA: Diagnosis not present

## 2021-02-17 DIAGNOSIS — Z720 Tobacco use: Secondary | ICD-10-CM | POA: Diagnosis not present

## 2021-02-17 DIAGNOSIS — G43909 Migraine, unspecified, not intractable, without status migrainosus: Secondary | ICD-10-CM | POA: Diagnosis not present

## 2021-02-17 DIAGNOSIS — Z79899 Other long term (current) drug therapy: Secondary | ICD-10-CM | POA: Diagnosis not present

## 2021-02-26 ENCOUNTER — Other Ambulatory Visit: Payer: Self-pay

## 2021-02-26 ENCOUNTER — Encounter: Payer: Self-pay | Admitting: Sports Medicine

## 2021-02-26 ENCOUNTER — Ambulatory Visit (INDEPENDENT_AMBULATORY_CARE_PROVIDER_SITE_OTHER): Payer: Medicare Other | Admitting: Sports Medicine

## 2021-02-26 DIAGNOSIS — B351 Tinea unguium: Secondary | ICD-10-CM

## 2021-02-26 DIAGNOSIS — M79674 Pain in right toe(s): Secondary | ICD-10-CM | POA: Diagnosis not present

## 2021-02-26 DIAGNOSIS — E119 Type 2 diabetes mellitus without complications: Secondary | ICD-10-CM | POA: Diagnosis not present

## 2021-02-26 MED ORDER — ACETAMINOPHEN 500 MG PO TABS: 500.0000 mg | ORAL_TABLET | Freq: Four times a day (QID) | ORAL | 0 refills | Status: DC | PRN

## 2021-02-26 NOTE — Progress Notes (Signed)
Subjective: Rebekah LAFEVER is a 65 y.o. female patient with history of diabetes who presents to office today complaining of long,mildly painful right second toenail.  Patient reports that it has been so tender that it hurts even when the sheets touch the toe.  Patient reports that the nail is long and she has been afraid to cut it because of diabetes.  Patient reports that this has been a problem with its been long thick and discolored for a couple of months and tender.  Patient reports that the pain is worse with shoes has been wearing sandals.  Has also used Neosporin to the area.  Denies any other pedal complaints at this time.  Patient Active Problem List   Diagnosis Date Noted   Encounter for gynecological examination with Papanicolaou smear of cervix 12/31/2020   Encounter for screening fecal occult blood testing 12/31/2020   Hallucinations 11/03/2020   Major depressive disorder, single episode, unspecified 11/03/2020   Other long term (current) drug therapy 11/03/2020   Severe recurrent major depression without psychotic features ([place]) 11/03/2020   Airway malacia 09/02/2020   Choking episode 09/02/2020   Chronic hypoxemic respiratory failure ([place]) 09/02/2020   Hip pain 05/27/2020   Degeneration of lumbar intervertebral disc 07/13/2019   Lumbar degenerative disc disease 07/13/2019   Lumbar radiculopathy 07/13/2019   Lumbar spondylosis 07/13/2019   Ganglion cyst of dorsum of left wrist s/p removal 09/07/18    DM (diabetes mellitus), type 2 ([place]) 08/30/2017   Respiratory bronchiolitis associated interstitial lung disease ([place]) 08/30/2017   Hyperglycemia 08/26/2017   Chronic pain 08/25/2017   COPD with acute exacerbation ([place]) 08/25/2017   Alcohol abuse 08/01/2015   Anorgasmia of female 01/01/2015   Low grade squamous intraepithelial lesion (LGSIL) on Papanicolaou smear of cervix 12/04/2014   Abdominal pain, chronic, epigastric 11/20/2014   Dysphagia, pharyngoesophageal phase     Hx of colonic polyps    COPD exacerbation ([place]) 07/05/2014   GERD without esophagitis 07/05/2014   Hypertension 07/05/2014   Bipolar 1 disorder ([place]) 07/05/2014   Anxiety 07/05/2014   Polysubstance abuse ([place]) 07/05/2014   Obesity (BMI 30-39.9) 07/05/2014   Encephalopathy, metabolic 09/60/4540   Personal history of colonic polyps 04/01/2014   Mild dysplasia of cervix 11/13/2013   Acute respiratory failure with hypoxia ([place]) 07/08/2013   Benzodiazepine dependence ([place]) 08/26/2012   COPD (chronic obstructive pulmonary disease) ([place]) 08/25/2012   Panic disorder 08/25/2012   Tobacco use disorder 08/25/2012   Hyperlipidemia 08/25/2012   Current Outpatient Medications on File Prior to Visit  Medication Sig Dispense Refill   Accu-Chek FastClix Lancets MISC USE TO CHECK BLOODOGLUCOSE ONCE DAILY     ACCU-CHEK GUIDE test strip USE TO CHECK BLOODEBLUCOSE ONCE DAILY.     albuterol (PROVENTIL) (2.5 MG/3ML) 0.083% nebulizer solution Take 3 mLs (2.5 mg total) by nebulization every 4 (four) hours as needed for wheezing or shortness of breath. 180 mL 2   albuterol-ipratropium (COMBIVENT) 18-103 MCG/ACT inhaler Inhale 2 puffs into the lungs 4 (four) times daily.      ARIPiprazole (ABILIFY) 5 MG tablet 1/2 tablet daily in the morning for 2-3 days, then increase to 1 tablet daily     BREZTRI AEROSPHERE 160-9-4.8 MCG/ACT AERO INHALE TWO PUFFS INTO THE LUNGS IN THE MORNING AND AT BEDTIME. 10.7 g 0   cetirizine (ZYRTEC) 10 MG tablet Take 10 mg by mouth daily as needed for allergies.      Cholecalciferol 50 MCG (2000 UT) CAPS Take by mouth.  COMBIVENT RESPIMAT 20-100 MCG/ACT AERS respimat INHALE 1 PUFF INTO LUNGS EVERY 4 HOURS. 4 g 3   cycloSPORINE (RESTASIS) 0.05 % ophthalmic emulsion Place 1 drop into both eyes 2 (two) times daily.     fluticasone (FLONASE) 50 MCG/ACT nasal spray Place 1 spray into both nostrils 2 (two) times daily as needed for allergies or rhinitis. 16 g 6   gabapentin (NEURONTIN)  800 MG tablet Take 400 mg by mouth 3 (three) times daily as needed (pain).      hydrOXYzine (VISTARIL) 50 MG capsule Take 1 capsule (50 mg total) by mouth 3 (three) times daily as needed for anxiety. 90 capsule 0   ILEVRO 0.3 % ophthalmic suspension Place 1 drop into the left eye daily.     ipratropium-albuterol (DUONEB) 0.5-2.5 (3) MG/3ML SOLN Take 3 mLs by nebulization every 4 (four) hours as needed. 360 mL 5   lisinopril (ZESTRIL) 30 MG tablet Take 30 mg by mouth daily.      metFORMIN (GLUCOPHAGE) 1000 MG tablet Take 1,000 mg by mouth 2 (two) times daily.     nabumetone (RELAFEN) 750 MG tablet Take 750 mg by mouth daily as needed for moderate pain.  (Patient not taking: Reported on 12/31/2020)     nitroGLYCERIN (NITROSTAT) 0.4 MG SL tablet DISSOLVE 1 TABLET UNDER THE TONGUE EVERY 5 MINUTES IF NEEDED FOR CHEST PAIN. MAX 3 DOSES THEN CALL 911. (Patient not taking: Reported on 12/31/2020) 25 tablet 3   omeprazole (PRILOSEC) 40 MG capsule 1 capsule 30 minutes before morning meal     OXYGEN Inhale 4.5 L into the lungs continuous.      PROAIR HFA 108 (90 Base) MCG/ACT inhaler Inhale 1-2 puffs into the lungs every 6 (six) hours as needed for wheezing or shortness of breath. 8 g 12   rizatriptan (MAXALT) 10 MG tablet Take 10-20 mg by mouth daily.     rOPINIRole (REQUIP) 1 MG tablet Take 1 mg by mouth 3 (three) times daily.      simvastatin (ZOCOR) 10 MG tablet Take 5 mg by mouth daily.     SUMAtriptan (IMITREX) 25 MG tablet Take by mouth.     torsemide (DEMADEX) 20 MG tablet Take 1 tablet (20 mg total) by mouth daily as needed. May take an additional 20 mg daily as needed for extreme swelling. 180 tablet 3   No current facility-administered medications on file prior to visit.   Allergies  Allergen Reactions   Penicillins Other (See Comments)    Patient is unsure if she allergic to penicillin or septra. Patient states one or another caused "rib pain with a little breathing problem". Has patient had a  PCN reaction causing immediate rash, facial/tongue/throat swelling, SOB or lightheadedness with hypotension: YES Has patient had a PCN reaction causing severe rash involving mucus membranes or skin necrosis: NO Has patient had a PCN reaction that required hospitalization: NO Has patient had a PCN reaction occurring within the last 10 years: NO   Septra [Sulfamethoxazole-Trimethoprim] Other (See Comments)    Patient is unsure if she allergic to septra or penicillin. Patient states one or another caused "rib pain with a little breathing problem".    Recent Results (from the past 2160 hour(s))  Cytology - PAP( [place])     Status: None   Collection Time: 12/31/20 11:36 AM  Result Value Ref Range   High risk HPV Negative    Adequacy      Satisfactory for evaluation; transformation zone component PRESENT.  Diagnosis      - Negative for intraepithelial lesion or malignancy (NILM)   Comment Normal Reference Range HPV - Negative   POCT occult blood stool     Status: None   Collection Time: 12/31/20 12:24 PM  Result Value Ref Range   Fecal Occult Blood, POC Negative Negative   Card #1 Date     Card #2 Fecal Occult Blod, POC     Card #2 Date     Card #3 Fecal Occult Blood, POC     Card #3 Date      Objective: General: Patient is awake, alert, and oriented x 3 and in no acute distress.  Integument: Skin is warm, dry and supple bilateral. Nails are tender, long, thickened and  dystrophic with subungual debris, consistent with onychomycosis, 1-5 bilateral with the right second toe most involved.  Surgical incision on the right foot well-healed. No signs of infection. No open lesions or preulcerative lesions present bilateral. Remaining integument unremarkable.  Vasculature:  Dorsalis Pedis pulse 1/4 bilateral. Posterior Tibial pulse 1/4 bilateral.  Capillary fill time <3 sec 1-5 bilateral. Positive hair growth to the level of the digits. Temperature gradient within normal limits. No  varicosities present bilateral. No edema present bilateral.   Neurology: The patient has intact sensation measured with a 5.07/10g Semmes Weinstein Monofilament at all pedal sites bilateral . Vibratory sensation diminished bilateral with tuning fork. No Babinski sign present bilateral.   Musculoskeletal: Pain to right second toe and thick toenail.  Long second toe noted bilateral.  Range of motion within normal limits at right first MPJ at area of previous implant surgery.  Assessment and Plan: Problem List Items Addressed This Visit   None Visit Diagnoses     Pain due to onychomycosis of toenail of right foot    -  Primary   Diabetes mellitus without complication ([place])           -Examined patient. -Discussed and educated patient on diabetic foot care, especially with  regards to the vascular, neurological and musculoskeletal systems.  -Stressed the importance of good glycemic control and the detriment of not  controlling glucose levels in relation to the foot. -Mechanically debrided all nails 1-5 bilateral especially the right second toe using sterile nail nipper and filed with dremel without incident  -Advised patient that I cannot give narcotics for pain and this toenail but I did prescribe Tylenol extra strength for her to take as directed -Advised patient if she still has pain may soak with Epson salts with assistance from her husband and may also continue to apply Neosporin but advised her to get Neosporin pain relief -Answered all patient questions -Patient to return  in 3 months for at risk foot care -Patient advised to call the office if any problems or questions arise in the meantime.  Landis Martins, DPM

## 2021-02-27 DIAGNOSIS — M5416 Radiculopathy, lumbar region: Secondary | ICD-10-CM | POA: Diagnosis not present

## 2021-03-02 DIAGNOSIS — M9904 Segmental and somatic dysfunction of sacral region: Secondary | ICD-10-CM | POA: Diagnosis not present

## 2021-03-02 DIAGNOSIS — M5432 Sciatica, left side: Secondary | ICD-10-CM | POA: Diagnosis not present

## 2021-03-02 DIAGNOSIS — M9902 Segmental and somatic dysfunction of thoracic region: Secondary | ICD-10-CM | POA: Diagnosis not present

## 2021-03-02 DIAGNOSIS — M9903 Segmental and somatic dysfunction of lumbar region: Secondary | ICD-10-CM | POA: Diagnosis not present

## 2021-03-03 ENCOUNTER — Ambulatory Visit: Payer: 59 | Admitting: Internal Medicine

## 2021-03-03 NOTE — Progress Notes (Deleted)
[name], female    DOB: [birth year], 65 y.o.   MRN: [id number]   Brief patient profile:  62 yowf *** self referred to pulmonary clinic in [place]  03/03/2021  for chronic cough prev under care of [name]   11/05/20 ov note: Previous patient of Dr. Luan Pulling for COPD. Has been on oxygen since 2018. years. 4-5LNC.  FEV1 51% of predicted 1.46L in 2018. She turns up oxygen when she gets short of breath.    Ongoing tobacco use disorder, Still 1ppd.  Uses combivent and pro-air in total 6-7 times/day.  Ongoing issues with chronic pain and anxiety, trouble sleeping. Dyspnea with minimal exertion at home. Last hospitalization in 2019. Dr. Luan Pulling had her on prednisone frequently, she would get solumedrol shots. High symptom burden    02/13/21 Phone note Call made to patient, confirmed DOB. Patient reports increased SOB with productive cough with green mucous over last week and a half. Denies wheezing. She confirms she is using her breztri daily and albuterol nebulizer 4-5x/day. She uses 4L of oxygen she reports her oxygen saturations have 95% and higher. Denies fever, chills, sore throat, or body aches. She has not tried anything OTC to address any of the symptoms. Rx doxy   History of Present Illness  03/03/2021  Pulmonary/ 1st office eval/ [facility] [facility] re  COPD GOLD 2 at least/ still smoking maint on ***  No chief complaint on file.    Dyspnea:  *** Cough: *** Sleep: *** SABA use:   Past Medical History:  Diagnosis Date   Anemia    Anxiety    Arthritis    Bipolar 1 disorder (HCC)    CHF (congestive heart failure) (HCC)    COPD (chronic obstructive pulmonary disease) (HCC)    Depression    Diabetes mellitus without complication (HCC)    Dyspnea    Emphysema of lung (HCC)    GERD (gastroesophageal reflux disease)    Headache    migraines   History of hiatal hernia    History of kidney stones    Hyperlipidemia    Hypertension    Neuromuscular disorder  (HCC)    neuropathy   On home oxygen therapy    Pneumonia 2015, 2019   Tracheomalacia    Vaginal Pap smear, abnormal     Outpatient Medications Prior to Visit  Medication Sig Dispense Refill   Accu-Chek FastClix Lancets MISC USE TO CHECK BLOODOGLUCOSE ONCE DAILY     ACCU-CHEK GUIDE test strip USE TO CHECK [place].     acetaminophen (TYLENOL) 500 MG tablet Take 1 tablet (500 mg total) by mouth every 6 (six) hours as needed. 30 tablet 0   albuterol (PROVENTIL) (2.5 MG/3ML) 0.083% nebulizer solution Take 3 mLs (2.5 mg total) by nebulization every 4 (four) hours as needed for wheezing or shortness of breath. 180 mL 2   albuterol-ipratropium (COMBIVENT) 18-103 MCG/ACT inhaler Inhale 2 puffs into the lungs 4 (four) times daily.      ARIPiprazole (ABILIFY) 5 MG tablet 1/2 tablet daily in the morning for 2-3 days, then increase to 1 tablet daily     BREZTRI AEROSPHERE 160-9-4.8 MCG/ACT AERO INHALE TWO PUFFS INTO THE LUNGS IN THE MORNING AND AT BEDTIME. 10.7 g 0   cetirizine (ZYRTEC) 10 MG tablet Take 10 mg by mouth daily as needed for allergies.      Cholecalciferol 50 MCG (2000 UT) CAPS Take by mouth.     COMBIVENT RESPIMAT 20-100 MCG/ACT AERS respimat  INHALE 1 PUFF INTO LUNGS EVERY 4 HOURS. 4 g 3   cycloSPORINE (RESTASIS) 0.05 % ophthalmic emulsion Place 1 drop into both eyes 2 (two) times daily.     fluticasone (FLONASE) 50 MCG/ACT nasal spray Place 1 spray into both nostrils 2 (two) times daily as needed for allergies or rhinitis. 16 g 6   gabapentin (NEURONTIN) 800 MG tablet Take 400 mg by mouth 3 (three) times daily as needed (pain).      hydrOXYzine (VISTARIL) 50 MG capsule Take 1 capsule (50 mg total) by mouth 3 (three) times daily as needed for anxiety. 90 capsule 0   ILEVRO 0.3 % ophthalmic suspension Place 1 drop into the left eye daily.     ipratropium-albuterol (DUONEB) 0.5-2.5 (3) MG/3ML SOLN Take 3 mLs by nebulization every 4 (four) hours as needed. 360 mL 5    lisinopril (ZESTRIL) 30 MG tablet Take 30 mg by mouth daily.      metFORMIN (GLUCOPHAGE) 1000 MG tablet Take 1,000 mg by mouth 2 (two) times daily.     nabumetone (RELAFEN) 750 MG tablet Take 750 mg by mouth daily as needed for moderate pain.  (Patient not taking: Reported on 12/31/2020)     nitroGLYCERIN (NITROSTAT) 0.4 MG SL tablet DISSOLVE 1 TABLET UNDER THE TONGUE EVERY 5 MINUTES IF NEEDED FOR CHEST PAIN. MAX 3 DOSES THEN CALL 911. (Patient not taking: Reported on 12/31/2020) 25 tablet 3   omeprazole (PRILOSEC) 40 MG capsule 1 capsule 30 minutes before morning meal     OXYGEN Inhale 4.5 L into the lungs continuous.      PROAIR HFA 108 (90 Base) MCG/ACT inhaler Inhale 1-2 puffs into the lungs every 6 (six) hours as needed for wheezing or shortness of breath. 8 g 12   rizatriptan (MAXALT) 10 MG tablet Take 10-20 mg by mouth daily.     rOPINIRole (REQUIP) 1 MG tablet Take 1 mg by mouth 3 (three) times daily.      simvastatin (ZOCOR) 10 MG tablet Take 5 mg by mouth daily.     SUMAtriptan (IMITREX) 25 MG tablet Take by mouth.     torsemide (DEMADEX) 20 MG tablet Take 1 tablet (20 mg total) by mouth daily as needed. May take an additional 20 mg daily as needed for extreme swelling. 180 tablet 3   No facility-administered medications prior to visit.     Objective:     There were no vitals taken for this visit.         Assessment   No problem-specific Assessment & Plan notes found for this encounter.     Christinia Gully, MD 03/03/2021

## 2021-03-13 ENCOUNTER — Telehealth: Payer: Self-pay | Admitting: Internal Medicine

## 2021-03-13 MED ORDER — PREDNISONE 20 MG PO TABS: 20.0000 mg | ORAL_TABLET | Freq: Every day | ORAL | 0 refills | Status: DC

## 2021-03-13 MED ORDER — AZITHROMYCIN 250 MG PO TABS: ORAL_TABLET | ORAL | 0 refills | Status: AC

## 2021-03-13 NOTE — Telephone Encounter (Signed)
Can begin Zpack #1 take as directed  Prednisone 20mg  daily for 5 days  Cont on current regimen  If not improving will need sooner follow up  Will need ov if not improving as abx called in last month as well   Please contact office for sooner follow up if symptoms do not improve or worsen or seek emergency care

## 2021-03-13 NOTE — Telephone Encounter (Signed)
Primary Pulmonologist: Shearon Stalls Last office visit and with whom: 11/05/20 with Shearon Stalls What do we see them for (pulmonary problems): COPD Last OV assessment/plan: Assessment:  Chronic Respiratory Failure Severe COPD FEV1 34% of predicted Tobacco Use Disorder Severe Tracheobronchomalacia Anxiety   Plan/Recommendations: Continue inhaler therapy with duoneb, Breztri, albuterol prn. I have referred her to pulmonary rehab for chronic respiratory failure. Discussed smoking cessation.    Discussed signs of exacerbation such as lower O2 sat on 4-5LNC not relieved by bronchodilators, sputum production, fevers, chest pain.   She is asking for vistaril prn for anxiety - I have agreed to a one month supply until she can discuss further with PCP.    I spent 30 minutes in the care of this patient today including pre-charting, chart review, review of results, face-to-face care, coordination of care and communication with consultants etc.).   Return to Care: Return in about 4 months (around 03/08/2021).  Was appointment offered to patient (explain)?  Pt wants recommendations   Reason for call: Called and spoke with pt who states she started coughing 3 days ago and is now coughing up yellow-green phlegm and also has had some wheezing.  Denies any complaints of fever.  States that she has been using her nebulizer at least 4 times daily, using her breztri inhaler as prescribed, and also using all other meds as prescribed.  Pt said that she is on 4.5L O2 and denies any worsening SOB.  Pt wants to know if something can be prescribed to help with her symptoms.  Tammy, please advise.   Allergies  Allergen Reactions   Penicillins Other (See Comments)    Patient is unsure if she allergic to penicillin or septra. Patient states one or another caused "rib pain with a little breathing problem". Has patient had a PCN reaction causing immediate rash, facial/tongue/throat swelling, SOB or lightheadedness with  hypotension: YES Has patient had a PCN reaction causing severe rash involving mucus membranes or skin necrosis: NO Has patient had a PCN reaction that required hospitalization: NO Has patient had a PCN reaction occurring within the last 10 years: NO   Septra [Sulfamethoxazole-Trimethoprim] Other (See Comments)    Patient is unsure if she allergic to septra or penicillin. Patient states one or another caused "rib pain with a little breathing problem".    Immunization History  Administered Date(s) Administered   Influenza Whole 03/29/2019   Influenza-Unspecified 03/06/2019   Moderna Sars-Covid-2 Vaccination 02/22/2020, 04/18/2020   Pneumococcal Polysaccharide-23 07/06/2014   Tdap 11/30/2013

## 2021-03-13 NOTE — Telephone Encounter (Signed)
Patient is aware of recommendations and voiced her understanding. Zpak and prednisone has been sent to preferred pharmacy.  Nothing further needed.

## 2021-03-18 DIAGNOSIS — M9904 Segmental and somatic dysfunction of sacral region: Secondary | ICD-10-CM | POA: Diagnosis not present

## 2021-03-18 DIAGNOSIS — M5432 Sciatica, left side: Secondary | ICD-10-CM | POA: Diagnosis not present

## 2021-03-18 DIAGNOSIS — M9903 Segmental and somatic dysfunction of lumbar region: Secondary | ICD-10-CM | POA: Diagnosis not present

## 2021-03-18 DIAGNOSIS — M9902 Segmental and somatic dysfunction of thoracic region: Secondary | ICD-10-CM | POA: Diagnosis not present

## 2021-03-19 ENCOUNTER — Other Ambulatory Visit: Payer: Self-pay | Admitting: Internal Medicine

## 2021-03-20 DIAGNOSIS — I10 Essential (primary) hypertension: Secondary | ICD-10-CM | POA: Diagnosis not present

## 2021-03-20 DIAGNOSIS — E118 Type 2 diabetes mellitus with unspecified complications: Secondary | ICD-10-CM | POA: Diagnosis not present

## 2021-03-20 DIAGNOSIS — E785 Hyperlipidemia, unspecified: Secondary | ICD-10-CM | POA: Diagnosis not present

## 2021-03-23 ENCOUNTER — Other Ambulatory Visit: Payer: Self-pay | Admitting: Internal Medicine

## 2021-03-25 ENCOUNTER — Other Ambulatory Visit: Payer: Self-pay | Admitting: Primary Care

## 2021-03-27 ENCOUNTER — Telehealth: Payer: Self-pay | Admitting: Internal Medicine

## 2021-03-27 MED ORDER — BREZTRI AEROSPHERE 160-9-4.8 MCG/ACT IN AERO: INHALATION_SPRAY | RESPIRATORY_TRACT | 3 refills | Status: DC

## 2021-03-27 NOTE — Telephone Encounter (Signed)
Called and spoke with patient. She stated that she needed a refill on her [name] to be sent to [facility]. I advised her that I would go ahead and send in the RX.   RX has been sent.   Nothing further needed at time of call.

## 2021-04-01 DIAGNOSIS — X32XXXA Exposure to sunlight, initial encounter: Secondary | ICD-10-CM | POA: Diagnosis not present

## 2021-04-01 DIAGNOSIS — L853 Xerosis cutis: Secondary | ICD-10-CM | POA: Diagnosis not present

## 2021-04-01 DIAGNOSIS — L570 Actinic keratosis: Secondary | ICD-10-CM | POA: Diagnosis not present

## 2021-04-01 DIAGNOSIS — L820 Inflamed seborrheic keratosis: Secondary | ICD-10-CM | POA: Diagnosis not present

## 2021-04-03 DIAGNOSIS — R6889 Other general symptoms and signs: Secondary | ICD-10-CM | POA: Diagnosis not present

## 2021-04-03 DIAGNOSIS — R609 Edema, unspecified: Secondary | ICD-10-CM | POA: Diagnosis not present

## 2021-04-03 DIAGNOSIS — Z743 Need for continuous supervision: Secondary | ICD-10-CM | POA: Diagnosis not present

## 2021-04-03 DIAGNOSIS — R0689 Other abnormalities of breathing: Secondary | ICD-10-CM | POA: Diagnosis not present

## 2021-04-03 DIAGNOSIS — R062 Wheezing: Secondary | ICD-10-CM | POA: Diagnosis not present

## 2021-04-04 ENCOUNTER — Emergency Department (HOSPITAL_COMMUNITY): Payer: 59

## 2021-04-04 ENCOUNTER — Emergency Department (HOSPITAL_COMMUNITY)
Admission: EM | Admit: 2021-04-04 | Discharge: 2021-04-04 | Disposition: A | Discharge status: Home / Self Care | Payer: 59 | Attending: Emergency Medicine | Admitting: Emergency Medicine

## 2021-04-04 ENCOUNTER — Encounter (HOSPITAL_COMMUNITY): Payer: Self-pay

## 2021-04-04 ENCOUNTER — Other Ambulatory Visit: Payer: Self-pay

## 2021-04-04 DIAGNOSIS — I110 Hypertensive heart disease with heart failure: Secondary | ICD-10-CM | POA: Insufficient documentation

## 2021-04-04 DIAGNOSIS — Z7951 Long term (current) use of inhaled steroids: Secondary | ICD-10-CM | POA: Insufficient documentation

## 2021-04-04 DIAGNOSIS — Z20822 Contact with and (suspected) exposure to covid-19: Secondary | ICD-10-CM | POA: Diagnosis not present

## 2021-04-04 DIAGNOSIS — F17210 Nicotine dependence, cigarettes, uncomplicated: Secondary | ICD-10-CM | POA: Diagnosis not present

## 2021-04-04 DIAGNOSIS — J441 Chronic obstructive pulmonary disease with (acute) exacerbation: Secondary | ICD-10-CM | POA: Insufficient documentation

## 2021-04-04 DIAGNOSIS — I509 Heart failure, unspecified: Secondary | ICD-10-CM | POA: Diagnosis not present

## 2021-04-04 DIAGNOSIS — E119 Type 2 diabetes mellitus without complications: Secondary | ICD-10-CM | POA: Insufficient documentation

## 2021-04-04 DIAGNOSIS — R0602 Shortness of breath: Secondary | ICD-10-CM | POA: Diagnosis present

## 2021-04-04 DIAGNOSIS — Z79899 Other long term (current) drug therapy: Secondary | ICD-10-CM | POA: Diagnosis not present

## 2021-04-04 LAB — COMPREHENSIVE METABOLIC PANEL
ALT: 20 U/L (ref 0–44)
AST: 21 U/L (ref 15–41)
Albumin: 4.2 g/dL (ref 3.5–5.0)
Alkaline Phosphatase: 83 U/L (ref 38–126)
Anion gap: 9 (ref 5–15)
BUN: 10 mg/dL (ref 8–23)
CO2: 30 mmol/L (ref 22–32)
Calcium: 11.4 mg/dL — ABNORMAL HIGH (ref 8.9–10.3)
Chloride: 93 mmol/L — ABNORMAL LOW (ref 98–111)
Creatinine, Ser: 0.72 mg/dL (ref 0.44–1.00)
GFR, Estimated: >60 mL/min (ref >60)
Glucose, Bld: 141 mg/dL — ABNORMAL HIGH (ref 70–99)
Potassium: 4.2 mmol/L (ref 3.5–5.1)
Sodium: 132 mmol/L — ABNORMAL LOW (ref 135–145)
Total Bilirubin: 0.4 mg/dL (ref 0.3–1.2)
Total Protein: 7.5 g/dL (ref 6.5–8.1)

## 2021-04-04 LAB — CBC WITH DIFFERENTIAL/PLATELET
Abs Immature Granulocytes: 0.06 10*3/uL (ref 0.00–0.07)
Basophils Absolute: 0.1 10*3/uL (ref 0.0–0.1)
Basophils Relative: 1 %
Eosinophils Absolute: 0.3 10*3/uL (ref 0.0–0.5)
Eosinophils Relative: 3 %
HCT: 41.5 % (ref 36.0–46.0)
Hemoglobin: 13.7 g/dL (ref 12.0–15.0)
Immature Granulocytes: 1 %
Lymphocytes Relative: 29 %
Lymphs Abs: 3.5 10*3/uL (ref 0.7–4.0)
MCH: 32 pg (ref 26.0–34.0)
MCHC: 33 g/dL (ref 30.0–36.0)
MCV: 97 fL (ref 80.0–100.0)
Monocytes Absolute: 0.9 10*3/uL (ref 0.1–1.0)
Monocytes Relative: 7 %
Neutro Abs: 7.4 10*3/uL (ref 1.7–7.7)
Neutrophils Relative %: 59 %
Platelets: 298 10*3/uL (ref 150–400)
RBC: 4.28 MIL/uL (ref 3.87–5.11)
RDW: 12.2 % (ref 11.5–15.5)
WBC: 12.2 10*3/uL — ABNORMAL HIGH (ref 4.0–10.5)
nRBC: 0 % (ref 0.0–0.2)

## 2021-04-04 LAB — CBG MONITORING, ED: Glucose-Capillary: 206 mg/dL — ABNORMAL HIGH (ref 70–99)

## 2021-04-04 LAB — TROPONIN I (HIGH SENSITIVITY)
Troponin I (High Sensitivity): 6 ng/L (ref <18)
Troponin I (High Sensitivity): 6 ng/L (ref <18)

## 2021-04-04 LAB — RESP PANEL BY RT-PCR (FLU A&B, COVID) ARPGX2
Influenza A by PCR: NEGATIVE
Influenza B by PCR: NEGATIVE
SARS Coronavirus 2 by RT PCR: NEGATIVE

## 2021-04-04 LAB — HEMOGLOBIN A1C
Hgb A1c MFr Bld: 6.3 % — ABNORMAL HIGH (ref 4.8–5.6)
Mean Plasma Glucose: 134.11 mg/dL

## 2021-04-04 LAB — BRAIN NATRIURETIC PEPTIDE: B Natriuretic Peptide: 19 pg/mL (ref 0.0–100.0)

## 2021-04-04 MED ORDER — ARIPIPRAZOLE 5 MG PO TABS
5.0000 mg | ORAL_TABLET | Freq: Every day | ORAL | Status: DC
  Administered 2021-04-04: 5 mg via ORAL
  Filled 2021-04-04: qty 1

## 2021-04-04 MED ORDER — INSULIN ASPART 100 UNIT/ML IJ SOLN
0.0000 [IU] | Freq: Three times a day (TID) | INTRAMUSCULAR | Status: DC
  Administered 2021-04-04: 5 [IU] via SUBCUTANEOUS
  Filled 2021-04-04: qty 1

## 2021-04-04 MED ORDER — GABAPENTIN 400 MG PO CAPS: 400.0000 mg | ORAL_CAPSULE | Freq: Three times a day (TID) | ORAL | Status: DC | PRN

## 2021-04-04 MED ORDER — ROPINIROLE HCL 1 MG PO TABS
1.0000 mg | ORAL_TABLET | Freq: Three times a day (TID) | ORAL | Status: DC
  Administered 2021-04-04: 1 mg via ORAL
  Filled 2021-04-04: qty 1

## 2021-04-04 MED ORDER — ACETAMINOPHEN 500 MG PO TABS
1000.0000 mg | ORAL_TABLET | Freq: Once | ORAL | Status: AC
  Administered 2021-04-04: 1000 mg via ORAL
  Filled 2021-04-04: qty 2

## 2021-04-04 MED ORDER — BUDESON-GLYCOPYRROL-FORMOTEROL 160-9-4.8 MCG/ACT IN AERO: 2.0000 | INHALATION_SPRAY | Freq: Two times a day (BID) | RESPIRATORY_TRACT | Status: DC

## 2021-04-04 MED ORDER — FLUTICASONE PROPIONATE 50 MCG/ACT NA SUSP: 1.0000 | Freq: Two times a day (BID) | NASAL | Status: DC | PRN

## 2021-04-04 MED ORDER — UMECLIDINIUM BROMIDE 62.5 MCG/INH IN AEPB: 1.0000 | INHALATION_SPRAY | Freq: Every day | RESPIRATORY_TRACT | Status: DC

## 2021-04-04 MED ORDER — PREDNISONE 50 MG PO TABS
60.0000 mg | ORAL_TABLET | Freq: Once | ORAL | Status: AC
  Administered 2021-04-04: 60 mg via ORAL
  Filled 2021-04-04: qty 1

## 2021-04-04 MED ORDER — SIMVASTATIN 10 MG PO TABS
5.0000 mg | ORAL_TABLET | Freq: Every day | ORAL | Status: DC
  Administered 2021-04-04: 5 mg via ORAL
  Filled 2021-04-04: qty 1

## 2021-04-04 MED ORDER — INSULIN ASPART 100 UNIT/ML IJ SOLN
0.0000 [IU] | Freq: Every day | INTRAMUSCULAR | Status: DC
Start: 2021-04-04 — End: 2021-04-04

## 2021-04-04 MED ORDER — IPRATROPIUM-ALBUTEROL 20-100 MCG/ACT IN AERS: 1.0000 | INHALATION_SPRAY | Freq: Four times a day (QID) | RESPIRATORY_TRACT | Status: DC | PRN

## 2021-04-04 MED ORDER — DOXYCYCLINE HYCLATE 100 MG PO TABS
100.0000 mg | ORAL_TABLET | Freq: Once | ORAL | Status: AC
  Administered 2021-04-04: 100 mg via ORAL
  Filled 2021-04-04: qty 1

## 2021-04-04 MED ORDER — ACETAMINOPHEN 500 MG PO TABS
500.0000 mg | ORAL_TABLET | Freq: Four times a day (QID) | ORAL | Status: DC | PRN
  Administered 2021-04-04: 500 mg via ORAL
  Filled 2021-04-04: qty 1

## 2021-04-04 MED ORDER — IPRATROPIUM BROMIDE 0.02 % IN SOLN
RESPIRATORY_TRACT | Status: AC
  Administered 2021-04-04: 0.5 mg
  Filled 2021-04-04: qty 2.5

## 2021-04-04 MED ORDER — PREDNISONE 20 MG PO TABS: ORAL_TABLET | ORAL | 0 refills | Status: DC

## 2021-04-04 MED ORDER — ALBUTEROL SULFATE (2.5 MG/3ML) 0.083% IN NEBU
2.5000 mg | INHALATION_SOLUTION | Freq: Four times a day (QID) | RESPIRATORY_TRACT | Status: DC | PRN
  Administered 2021-04-04: 2.5 mg via RESPIRATORY_TRACT
  Filled 2021-04-04: qty 3

## 2021-04-04 MED ORDER — SUMATRIPTAN SUCCINATE 50 MG PO TABS
50.0000 mg | ORAL_TABLET | ORAL | Status: DC | PRN
  Filled 2021-04-04: qty 1

## 2021-04-04 MED ORDER — ALBUTEROL SULFATE (2.5 MG/3ML) 0.083% IN NEBU
INHALATION_SOLUTION | RESPIRATORY_TRACT | Status: AC
  Administered 2021-04-04: 5 mg
  Filled 2021-04-04: qty 6

## 2021-04-04 MED ORDER — LISINOPRIL 10 MG PO TABS
30.0000 mg | ORAL_TABLET | Freq: Every day | ORAL | Status: DC
  Administered 2021-04-04: 30 mg via ORAL
  Filled 2021-04-04: qty 3

## 2021-04-04 MED ORDER — HYDROXYZINE HCL 25 MG PO TABS: 50.0000 mg | ORAL_TABLET | Freq: Three times a day (TID) | ORAL | Status: DC | PRN

## 2021-04-04 MED ORDER — ARFORMOTEROL TARTRATE 15 MCG/2ML IN NEBU
15.0000 ug | INHALATION_SOLUTION | Freq: Two times a day (BID) | RESPIRATORY_TRACT | Status: DC
  Administered 2021-04-04: 15 ug via RESPIRATORY_TRACT
  Filled 2021-04-04: qty 2

## 2021-04-04 MED ORDER — TORSEMIDE 20 MG PO TABS: 20.0000 mg | ORAL_TABLET | Freq: Every day | ORAL | Status: DC | PRN

## 2021-04-04 MED ORDER — METFORMIN HCL 500 MG PO TABS
1000.0000 mg | ORAL_TABLET | Freq: Two times a day (BID) | ORAL | Status: DC
  Administered 2021-04-04: 1000 mg via ORAL
  Filled 2021-04-04: qty 2

## 2021-04-04 MED ORDER — PANTOPRAZOLE SODIUM 40 MG PO TBEC
80.0000 mg | DELAYED_RELEASE_TABLET | Freq: Every day | ORAL | Status: DC
  Administered 2021-04-04: 80 mg via ORAL
  Filled 2021-04-04: qty 2

## 2021-04-04 MED ORDER — DOXYCYCLINE HYCLATE 100 MG PO CAPS: 100.0000 mg | ORAL_CAPSULE | Freq: Two times a day (BID) | ORAL | 0 refills | Status: DC

## 2021-04-04 MED ORDER — METFORMIN HCL 500 MG PO TABS: 1000.0000 mg | ORAL_TABLET | Freq: Two times a day (BID) | ORAL | Status: DC

## 2021-04-04 MED ORDER — BUDESONIDE 0.25 MG/2ML IN SUSP
0.2500 mg | Freq: Two times a day (BID) | RESPIRATORY_TRACT | Status: DC
  Administered 2021-04-04: 0.25 mg via RESPIRATORY_TRACT
  Filled 2021-04-04: qty 2

## 2021-04-04 MED ORDER — ALBUTEROL SULFATE HFA 108 (90 BASE) MCG/ACT IN AERS
4.0000 | INHALATION_SPRAY | Freq: Once | RESPIRATORY_TRACT | Status: AC
  Administered 2021-04-04: 4 via RESPIRATORY_TRACT
  Filled 2021-04-04: qty 6.7
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
## 2021-04-04 NOTE — ED Notes (Signed)
ED Provider at bedside. 
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
## 2021-04-04 NOTE — ED Provider Notes (Signed)
[facility] EMERGENCY DEPARTMENT Provider Note   CSN: [id number] Arrival date & time: 04/04/21  0012     History Chief Complaint  Patient presents with   Shortness of Breath    [name] is a 65 y.o. female.   Shortness of Breath Severity:  Moderate Onset quality:  Gradual Duration:  2 days Timing:  Constant Progression:  Worsening Chronicity:  Recurrent Context: not activity   Relieved by:  None tried Worsened by:  Nothing     Past Medical History:  Diagnosis Date   Anemia    Anxiety    Arthritis    Bipolar 1 disorder (HCC)    CHF (congestive heart failure) (HCC)    COPD (chronic obstructive pulmonary disease) (HCC)    Depression    Diabetes mellitus without complication (HCC)    Dyspnea    Emphysema of lung (HCC)    GERD (gastroesophageal reflux disease)    Headache    migraines   History of hiatal hernia    History of kidney stones    Hyperlipidemia    Hypertension    Neuromuscular disorder (HCC)    neuropathy   On home oxygen therapy    Pneumonia 2015, 2019   Tracheomalacia    Vaginal Pap smear, abnormal     Patient Active Problem List   Diagnosis Date Noted   Encounter for gynecological examination with Papanicolaou smear of cervix 12/31/2020   Encounter for screening fecal occult blood testing 12/31/2020   Hallucinations 11/03/2020   Major depressive disorder, single episode, unspecified 11/03/2020   Other long term (current) drug therapy 11/03/2020   Severe recurrent major depression without psychotic features ([place]) 11/03/2020   Airway malacia 09/02/2020   Choking episode 09/02/2020   Chronic hypoxemic respiratory failure ([place]) 09/02/2020   Hip pain 05/27/2020   Degeneration of lumbar intervertebral disc 07/13/2019   Lumbar degenerative disc disease 07/13/2019   Lumbar radiculopathy 07/13/2019   Lumbar spondylosis 07/13/2019   Ganglion cyst of dorsum of left wrist s/p removal 09/07/18    DM (diabetes mellitus), type 2 ([place])  08/30/2017   Respiratory bronchiolitis associated interstitial lung disease ([place]) 08/30/2017   Hyperglycemia 08/26/2017   Chronic pain 08/25/2017   COPD with acute exacerbation ([place]) 08/25/2017   Alcohol abuse 08/01/2015   Anorgasmia of female 01/01/2015   Low grade squamous intraepithelial lesion (LGSIL) on Papanicolaou smear of cervix 12/04/2014   Abdominal pain, chronic, epigastric 11/20/2014   Dysphagia, pharyngoesophageal phase    Hx of colonic polyps    COPD exacerbation ([place]) 07/05/2014   GERD without esophagitis 07/05/2014   Hypertension 07/05/2014   Bipolar 1 disorder ([place]) 07/05/2014   Anxiety 07/05/2014   Polysubstance abuse ([place]) 07/05/2014   Obesity (BMI 30-39.9) 07/05/2014   Encephalopathy, metabolic 70/26/3785   Personal history of colonic polyps 04/01/2014   Mild dysplasia of cervix 11/13/2013   Acute respiratory failure with hypoxia ([place]) 07/08/2013   Benzodiazepine dependence ([place]) 08/26/2012   COPD (chronic obstructive pulmonary disease) ([place]) 08/25/2012   Panic disorder 08/25/2012   Tobacco use disorder 08/25/2012   Hyperlipidemia 08/25/2012    Past Surgical History:  Procedure Laterality Date   CATARACT EXTRACTION W/PHACO Left 01/14/2020   Procedure: CATARACT EXTRACTION PHACO AND INTRAOCULAR LENS PLACEMENT LEFT EYE;  Surgeon: Baruch Goldmann, MD;  Location: [facility];  Service: Ophthalmology;  Laterality: Left;  CDE: 8.18   CATARACT EXTRACTION W/PHACO Right 02/01/2020   Procedure: CATARACT EXTRACTION PHACO AND INTRAOCULAR LENS PLACEMENT RIGHT EYE;  Surgeon: Baruch Goldmann, MD;  Location: [facility];  Service: Ophthalmology;  Laterality: Right;  CDE: 9.94   CHEILECTOMY Right 01/28/2020   Procedure: KELLER BUNION IMPLANT RIGHT FOOT CHEILECTOMY RIGHT ROOT;  Surgeon: Landis Martins, DPM;  Location: [facility];  Service: Podiatry;  Laterality: Right;  BLOCK   CHOLECYSTECTOMY     COLONOSCOPY  July 2010   Dr. Arnoldo Morale: 3 rectal polyps, not enough tissue for pathologic  examination, recommended surveillance in 3 years   COLONOSCOPY N/A 10/23/2014   RMR: Multiple colonic polyps removed as described above. No endoscopic explaniation for abdominal pain. however. next tcs 10/2019   ESOPHAGOGASTRODUODENOSCOPY  July 2010   Dr. Arnoldo Morale: gastritis and duodenitis, H.pylori negative   ESOPHAGOGASTRODUODENOSCOPY N/A 10/23/2014   RMR: Normal EGD. Status post passage of a Maloney dilator. Today's finding s would not explain abdominal pain   EYE SURGERY Left 01/14/2020   cataract removal   GANGLION CYST EXCISION Left 09/07/2018   Procedure: REMOVAL GANGLION OF WRIST;  Surgeon: Carole Civil, MD;  Location: [facility];  Service: Orthopedics;  Laterality: Left;   HERNIA REPAIR     Dr. Arnoldo Morale   KIDNEY STONE SURGERY     MALONEY DILATION N/A 10/23/2014   Procedure: Venia Minks DILATION;  Surgeon: Daneil Dolin, MD;  Location: [facility];  Service: Endoscopy;  Laterality: N/A;   OPEN REDUCTION INTERNAL FIXATION (ORIF) DISTAL RADIAL FRACTURE Right 12/09/2015   Procedure: OPEN REDUCTION INTERNAL FIXATION (ORIF) RIGHT DISTAL RADIUS;  Surgeon: Leanora Cover, MD;  Location: [facility];  Service: Orthopedics;  Laterality: Right;     OB History     Gravida  5   Para  3   Term  3   Preterm      AB  2   Living  1      SAB  2   IAB      Ectopic      Multiple      Live Births  3           Family History  Adopted: Yes    Social History   Tobacco Use   Smoking status: Every Day    Packs/day: 0.50    Years: 52.00    Pack years: 26.00    Types: Cigarettes    Start date: 1969   Smokeless tobacco: Never   Tobacco comments:    06/19/20- 10-12 cigarettes a day   Vaping Use   Vaping Use: Never used  Substance Use Topics   Alcohol use: Not Currently    Alcohol/week: 1.0 standard drink    Types: 1 Standard drinks or equivalent per week    Comment: quit 11/03/2014. used to drink couple of 40 ounces.   Drug use: No    Home Medications Prior  to Admission medications   Medication Sig Start Date End Date Taking? Authorizing Provider  doxycycline (VIBRAMYCIN) 100 MG capsule Take 1 capsule (100 mg total) by mouth 2 (two) times daily. One po bid x 7 days 04/04/21  Yes Jakson Delpilar, Corene Cornea, MD  predniSONE (DELTASONE) 20 MG tablet 3 tabs po day one, then 2 tabs daily x 4 days 04/04/21  Yes Ruther Ephraim, Corene Cornea, MD  Accu-Chek FastClix Lancets MISC USE TO [place] 09/18/19   [provider]  ACCU-CHEK GUIDE test strip USE TO CHECK [place]. 09/18/19   [provider]  acetaminophen (TYLENOL) 500 MG tablet Take 1 tablet (500 mg total) by mouth every 6 (six) hours as needed. 02/26/21   Landis Martins,  DPM  albuterol (PROVENTIL) (2.5 MG/3ML) 0.083% nebulizer solution INHALE 1 VIAL VIA NEBULIZER EVERY 4 HOURS 03/25/21   Spero Geralds, MD  albuterol-ipratropium (COMBIVENT) 18-103 MCG/ACT inhaler Inhale 2 puffs into the lungs 4 (four) times daily.     [provider]  ARIPiprazole (ABILIFY) 5 MG tablet 1/2 tablet daily in the morning for 2-3 days, then increase to 1 tablet daily 12/02/20   [provider]  Budeson-Glycopyrrol-Formoterol (BREZTRI AEROSPHERE) 160-9-4.8 MCG/ACT AERO INHALE TWO PUFFS INTO THE LUNGS IN THE MORNING AND AT BEDTIME. 03/27/21   Spero Geralds, MD  cetirizine (ZYRTEC) 10 MG tablet Take 10 mg by mouth daily as needed for allergies.  11/02/19   [provider]  Cholecalciferol 50 MCG (2000 UT) CAPS Take by mouth.    [provider]  cycloSPORINE (RESTASIS) 0.05 % ophthalmic emulsion Place 1 drop into both eyes 2 (two) times daily.    [provider]  fluticasone (FLONASE) 50 MCG/ACT nasal spray Place 1 spray into both nostrils 2 (two) times daily as needed for allergies or rhinitis. 07/18/15   Dettinger, Fransisca Kaufmann, MD  gabapentin (NEURONTIN) 800 MG tablet Take 400 mg by mouth 3 (three) times daily as needed (pain).  04/23/19   [provider]   hydrOXYzine (VISTARIL) 50 MG capsule TAKE 1 CAPSULE BY MOUTH THREE TIMES DAILY AS NEEDED FOR ANXIETY 03/23/21   Spero Geralds, MD  ILEVRO 0.3 % ophthalmic suspension Place 1 drop into the left eye daily. 01/10/20   [provider]  ipratropium (ATROVENT) 0.02 % nebulizer solution INHALE (1) VIAL VIA NEBULIZER EVERY FOUR HOURS AS NEEDED. 03/25/21   Spero Geralds, MD  Ipratropium-Albuterol (COMBIVENT RESPIMAT) 20-100 MCG/ACT AERS respimat INHALE 1 PUFF INTO LUNGS EVERY 4 HOURS. 03/19/21   Spero Geralds, MD  ipratropium-albuterol (DUONEB) 0.5-2.5 (3) MG/3ML SOLN Take 3 mLs by nebulization every 4 (four) hours as needed. 03/21/20   Martyn Ehrich, NP  lisinopril (ZESTRIL) 30 MG tablet Take 30 mg by mouth daily.     [provider]  metFORMIN (GLUCOPHAGE) 1000 MG tablet Take 1,000 mg by mouth 2 (two) times daily. 11/01/19   [provider]  omeprazole (PRILOSEC) 40 MG capsule 1 capsule 30 minutes before morning meal    [provider]  OXYGEN Inhale 4.5 L into the lungs continuous.     [provider]  PROAIR HFA 108 [id number] Base) MCG/ACT inhaler Inhale 1-2 puffs into the lungs every 6 (six) hours as needed for wheezing or shortness of breath. 01/12/21   Spero Geralds, MD  rizatriptan (MAXALT) 10 MG tablet Take 10-20 mg by mouth daily. 11/27/20   [provider]  rOPINIRole (REQUIP) 1 MG tablet Take 1 mg by mouth 3 (three) times daily.  09/12/15   [provider]  simvastatin (ZOCOR) 10 MG tablet Take 5 mg by mouth daily. 11/29/19   [provider]  SUMAtriptan (IMITREX) 25 MG tablet Take by mouth. 12/23/20   [provider]  torsemide (DEMADEX) 20 MG tablet Take 1 tablet (20 mg total) by mouth daily as needed. May take an additional 20 mg daily as needed for extreme swelling. 06/19/19   Strader, Fransisco Hertz, PA-C    Allergies    Penicillins and Septra [sulfamethoxazole-trimethoprim]  Review of Systems   Review of Systems   Respiratory:  Positive for shortness of breath.   All other systems reviewed and are negative.  Physical Exam Updated Vital Signs BP (!) 114/56 (BP  Location: Left Arm)   Pulse 91   Temp 97.8 F (36.6 C)   Resp 20   Ht 5\' 4"  (1.626 m)   Wt 102 kg   SpO2 100%   BMI 38.60 kg/m   Physical Exam Vitals and nursing note reviewed.  Constitutional:      Appearance: She is well-developed.  HENT:     Head: Normocephalic and atraumatic.  Cardiovascular:     Rate and Rhythm: Normal rate and regular rhythm.  Pulmonary:     Effort: No respiratory distress.     Breath sounds: No stridor. Decreased breath sounds and wheezing present.  Chest:     Chest wall: No mass or tenderness.  Abdominal:     General: There is no distension.  Musculoskeletal:     Cervical back: Normal range of motion.  Skin:    General: Skin is warm and dry.  Neurological:     General: No focal deficit present.     Mental Status: She is alert.    ED Results / Procedures / Treatments   Labs (all labs ordered are listed, but only abnormal results are displayed) Labs Reviewed  CBC WITH DIFFERENTIAL/PLATELET - Abnormal; Notable for the following components:      Result Value   WBC 12.2 (*)    All other components within normal limits  COMPREHENSIVE METABOLIC PANEL - Abnormal; Notable for the following components:   Sodium 132 (*)    Chloride 93 (*)    Glucose, Bld 141 (*)    Calcium 11.4 (*)    All other components within normal limits  RESP PANEL BY RT-PCR (FLU A&B, COVID) ARPGX2  BRAIN NATRIURETIC PEPTIDE  HEMOGLOBIN A1C  TROPONIN I (HIGH SENSITIVITY)  TROPONIN I (HIGH SENSITIVITY)    EKG EKG Interpretation  Date/Time:  Saturday April 04 2021 00:16:02 EDT Ventricular Rate:  91 PR Interval:  173 QRS Duration: 94 QT Interval:  357 QTC Calculation: 440 R Axis:   72 Text Interpretation: Sinus rhythm Confirmed by Merrily Pew [phone]) on 04/04/2021 1:09:22 AM  Radiology DG Chest Portable 1  View  Result Date: 04/04/2021 CLINICAL DATA:  Shortness of breath EXAM: PORTABLE CHEST 1 VIEW COMPARISON:  08/25/2017 FINDINGS: Cardiac shadow is within normal limits. Aortic calcifications are noted. Lungs are well aerated bilaterally. Mild interstitial changes are again identified of a chronic nature. No focal infiltrate is seen. No bony abnormality is noted. IMPRESSION: No acute abnormality noted. Electronically Signed   By: Inez Catalina M.D.   On: 04/04/2021 01:02    Procedures Procedures   Medications Ordered in ED Medications  acetaminophen (TYLENOL) tablet 500 mg (has no administration in time range)  albuterol (PROVENTIL) (2.5 MG/3ML) 0.083% nebulizer solution 2.5 mg (2.5 mg Nebulization Given 04/04/21 0524)  ARIPiprazole (ABILIFY) tablet 5 mg (has no administration in time range)  Budeson-Glycopyrrol-Formoterol 160-9-4.8 MCG/ACT AERO 2 puff (has no administration in time range)  fluticasone (FLONASE) 50 MCG/ACT nasal spray 1 spray (has no administration in time range)  gabapentin (NEURONTIN) capsule 400 mg (has no administration in time range)  hydrOXYzine (ATARAX/VISTARIL) tablet 50 mg (has no administration in time range)  Ipratropium-Albuterol (COMBIVENT) respimat 1 puff (has no administration in time range)  lisinopril (ZESTRIL) tablet 30 mg (has no administration in time range)  pantoprazole (PROTONIX) EC tablet 80 mg (has no administration in time range)  rOPINIRole (REQUIP) tablet 1 mg (has no administration in time range)  simvastatin (ZOCOR) tablet 5 mg (has no administration in time range)  SUMAtriptan (IMITREX)  tablet 50 mg (has no administration in time range)  torsemide (DEMADEX) tablet 20 mg (has no administration in time range)  insulin aspart (novoLOG) injection 0-15 Units (has no administration in time range)  insulin aspart (novoLOG) injection 0-5 Units (has no administration in time range)  metFORMIN (GLUCOPHAGE) tablet 1,000 mg (has no administration in time  range)  albuterol (VENTOLIN HFA) 108 (90 Base) MCG/ACT inhaler 4 puff (4 puffs Inhalation Given 04/04/21 0052)  ipratropium (ATROVENT) 0.02 % nebulizer solution (0.5 mg  Given 04/04/21 0107)  albuterol (PROVENTIL) (2.5 MG/3ML) 0.083% nebulizer solution (5 mg  Given 04/04/21 0107)  acetaminophen (TYLENOL) tablet 1,000 mg (1,000 mg Oral Given 04/04/21 0240)  doxycycline (VIBRA-TABS) tablet 100 mg (100 mg Oral Given 04/04/21 0358)  predniSONE (DELTASONE) tablet 60 mg (60 mg Oral Given 04/04/21 0358)    ED Course  I have reviewed the triage vital signs and the nursing notes.  Pertinent labs & imaging results that were available during my care of the patient were reviewed by me and considered in my medical decision making (see chart for details).    MDM Rules/Calculators/A&P                         Patient still on her home oxygen.  However they do not have electricity and thus she will not have oxygen when she gets home so she will continue staying here until that resolves itself.  TOC consult placed.  Overall patient is likely here with COPD exacerbation we have treated her for the same.  Go-ahead start antibiotics as well.  But she feels much better after breathing treatments and once again no indication for admission to hospital.  Final Clinical Impression(s) / ED Diagnoses Final diagnoses:  COPD exacerbation ([place])    Rx / DC Orders ED Discharge Orders          Ordered    doxycycline (VIBRAMYCIN) 100 MG capsule  2 times daily        04/04/21 0330    predniSONE (DELTASONE) 20 MG tablet        04/04/21 0330             Leandria Thier, Corene Cornea, MD 04/04/21 [phone]

## 2021-04-04 NOTE — ED Notes (Signed)
Pt ambulated on Pulse Ox. On room air, Pt's O2 Sats dropped to 85%. Pt returned to room and placed back on 4L Nasal Cannula. Pts O2 Sats improved to 94%. EDP Notified.

## 2021-04-09 ENCOUNTER — Telehealth: Payer: Self-pay | Admitting: Internal Medicine

## 2021-04-09 DIAGNOSIS — J9611 Chronic respiratory failure with hypoxia: Secondary | ICD-10-CM

## 2021-04-09 NOTE — Telephone Encounter (Signed)
I have called the pt and she stated that she is needing to get a roalter chair ( rolling walker with a seat) from [employer] to use when she is out shopping.  She stated that she is on 4 liters of oxygen and sometimes she just needs to sit down.  She stated that [employer] told her that they have these and can get her one.  Pt is aware that ND is not back in the office until 10/11,  ND please advise. Thanks

## 2021-04-13 ENCOUNTER — Ambulatory Visit: Payer: 59 | Admitting: Family Medicine

## 2021-04-13 NOTE — Progress Notes (Deleted)
Cardiology Office Note  Date: 04/13/2021   ID: [name], [name] 1955/07/19, MRN [id number]  PCP:  Jani Gravel, MD  Cardiologist:  Carlyle Dolly, MD Electrophysiologist:  None   Chief Complaint: * hosptial follow up - CHF (Did not have lexiscan)  History of Present Illness: [name] is a 65 y.o. female with a history of HTN, COPD, chronic respiratory failure with hypoxia,, CHF, GERD, tobacco use disorder, hyperlipidemia, DM2, HLD, chronic diastolic CHF, coronary artery calcification on prior CT of the low risk nuclear stress test 2015.  She was last seen by Bernerd Pho, [place] 07/31/2020.  She continued to have dyspnea on exertion on chronic 4 L nasal cannula.  She was waiting to hear back from pulmonology regarding using BiPAP or CPAP at night if approved by her insurance.  She reports some episodes of left-sided chest pressure which radiated into her left arm.  She stated it typically occurred at rest.  She took one of her husbands sublingual nitroglycerin the prior week with resolution of the symptoms.  Plan was for a Lexiscan Myoview her blood pressure was well controlled she was continuing current medication regimen with lisinopril 30 mg daily.  She was only taking torsemide if needed approximately once every few weeks.  Her hyperlipidemia was being followed by PCP she was continuing simvastatin 5 mg daily.  Recent PCP labs were requested.  She continued to smoke 1/2 pack/day of cigarettes.     Recent presentation to [facility] ED on 04/04/2021 for shortness of breath.  She arrived via EMS from home complaining of difficulty breathing.  She was chronically on 4 L nasal cannula.  She had lost power at home and did not have enough oxygen and left on her home today.  She had audible wheezing and edema in both lower extremities.  She received breathing treatments and was started on doxycycline and prednisone.  Troponins were negative x2.  WBC was 12.2, blood sugar was  206, hemoglobin A1c 6.3, sodium was 132, BNP was 19, COVID PCR and influenza were negative, chest x-ray was negative.  Discharge diagnosis was COPD exacerbation.   Past Medical History:  Diagnosis Date   Anemia    Anxiety    Arthritis    Bipolar 1 disorder (HCC)    CHF (congestive heart failure) (HCC)    COPD (chronic obstructive pulmonary disease) (HCC)    Depression    Diabetes mellitus without complication (HCC)    Dyspnea    Emphysema of lung (HCC)    GERD (gastroesophageal reflux disease)    Headache    migraines   History of hiatal hernia    History of kidney stones    Hyperlipidemia    Hypertension    Neuromuscular disorder (HCC)    neuropathy   On home oxygen therapy    Pneumonia 2015, 2019   Tracheomalacia    Vaginal Pap smear, abnormal     Past Surgical History:  Procedure Laterality Date   CATARACT EXTRACTION W/PHACO Left 01/14/2020   Procedure: CATARACT EXTRACTION PHACO AND INTRAOCULAR LENS PLACEMENT LEFT EYE;  Surgeon: Baruch Goldmann, MD;  Location: [facility];  Service: Ophthalmology;  Laterality: Left;  CDE: 8.18   CATARACT EXTRACTION W/PHACO Right 02/01/2020   Procedure: CATARACT EXTRACTION PHACO AND INTRAOCULAR LENS PLACEMENT RIGHT EYE;  Surgeon: Baruch Goldmann, MD;  Location: [facility];  Service: Ophthalmology;  Laterality: Right;  CDE: 9.94   CHEILECTOMY Right 01/28/2020   Procedure: KELLER BUNION IMPLANT RIGHT FOOT CHEILECTOMY RIGHT ROOT;  Surgeon: Landis Martins, DPM;  Location: [place];  Service: Podiatry;  Laterality: Right;  BLOCK   CHOLECYSTECTOMY     COLONOSCOPY  July 2010   Dr. Arnoldo Morale: 3 rectal polyps, not enough tissue for pathologic examination, recommended surveillance in 3 years   COLONOSCOPY N/A 10/23/2014   RMR: Multiple colonic polyps removed as described above. No endoscopic explaniation for abdominal pain. however. next tcs 10/2019   ESOPHAGOGASTRODUODENOSCOPY  July 2010   Dr. Arnoldo Morale: gastritis and duodenitis, H.pylori negative    ESOPHAGOGASTRODUODENOSCOPY N/A 10/23/2014   RMR: Normal EGD. Status post passage of a Maloney dilator. Today's finding s would not explain abdominal pain   EYE SURGERY Left 01/14/2020   cataract removal   GANGLION CYST EXCISION Left 09/07/2018   Procedure: REMOVAL GANGLION OF WRIST;  Surgeon: Carole Civil, MD;  Location: [facility];  Service: Orthopedics;  Laterality: Left;   HERNIA REPAIR     Dr. Arnoldo Morale   KIDNEY STONE SURGERY     MALONEY DILATION N/A 10/23/2014   Procedure: Venia Minks DILATION;  Surgeon: Daneil Dolin, MD;  Location: [facility];  Service: Endoscopy;  Laterality: N/A;   OPEN REDUCTION INTERNAL FIXATION (ORIF) DISTAL RADIAL FRACTURE Right 12/09/2015   Procedure: OPEN REDUCTION INTERNAL FIXATION (ORIF) RIGHT DISTAL RADIUS;  Surgeon: Leanora Cover, MD;  Location: [place] [facility];  Service: Orthopedics;  Laterality: Right;    Current Outpatient Medications  Medication Sig Dispense Refill   Accu-Chek FastClix Lancets MISC USE TO CHECK BLOODOGLUCOSE ONCE DAILY     ACCU-CHEK GUIDE test strip USE TO CHECK BLOODEBLUCOSE ONCE DAILY.     acetaminophen (TYLENOL) 500 MG tablet Take 1 tablet (500 mg total) by mouth every 6 (six) hours as needed. 30 tablet 0   albuterol (PROVENTIL) (2.5 MG/3ML) 0.083% nebulizer solution INHALE 1 VIAL VIA NEBULIZER EVERY 4 HOURS 525 mL 0   albuterol-ipratropium (COMBIVENT) 18-103 MCG/ACT inhaler Inhale 2 puffs into the lungs 4 (four) times daily.      ARIPiprazole (ABILIFY) 10 MG tablet Take 10 mg by mouth daily.     Budeson-Glycopyrrol-Formoterol (BREZTRI AEROSPHERE) 160-9-4.8 MCG/ACT AERO INHALE TWO PUFFS INTO THE LUNGS IN THE MORNING AND AT BEDTIME. 10.7 g 3   cetirizine (ZYRTEC) 10 MG tablet Take 10 mg by mouth daily as needed for allergies.      doxycycline (VIBRAMYCIN) 100 MG capsule Take 1 capsule (100 mg total) by mouth 2 (two) times daily. One po bid x 7 days 14 capsule 0   fluticasone (FLONASE) 50 MCG/ACT nasal spray Place 1 spray into  both nostrils 2 (two) times daily as needed for allergies or rhinitis. (Patient not taking: Reported on 04/04/2021) 16 g 6   gabapentin (NEURONTIN) 800 MG tablet Take 800 mg by mouth 3 (three) times daily.     hydrOXYzine (VISTARIL) 50 MG capsule TAKE 1 CAPSULE BY MOUTH THREE TIMES DAILY AS NEEDED FOR ANXIETY 90 capsule 0   ipratropium (ATROVENT) 0.02 % nebulizer solution INHALE (1) VIAL VIA NEBULIZER EVERY FOUR HOURS AS NEEDED. (Patient not taking: Reported on 04/04/2021) 437.5 mL 0   Ipratropium-Albuterol (COMBIVENT RESPIMAT) 20-100 MCG/ACT AERS respimat INHALE 1 PUFF INTO LUNGS EVERY 4 HOURS. (Patient not taking: Reported on 04/04/2021) 4 g 3   ipratropium-albuterol (DUONEB) 0.5-2.5 (3) MG/3ML SOLN Take 3 mLs by nebulization every 4 (four) hours as needed. 360 mL 5   lisinopril (ZESTRIL) 30 MG tablet Take 30 mg by mouth daily.      metFORMIN (GLUCOPHAGE) 1000 MG tablet Take 1,000 mg  by mouth 2 (two) times daily.     omeprazole (PRILOSEC) 40 MG capsule 1 capsule 30 minutes before morning meal     OXYGEN Inhale 4.5 L into the lungs continuous.      predniSONE (DELTASONE) 20 MG tablet 3 tabs po day one, then 2 tabs daily x 4 days 11 tablet 0   PROAIR HFA 108 (90 Base) MCG/ACT inhaler Inhale 1-2 puffs into the lungs every 6 (six) hours as needed for wheezing or shortness of breath. 8 g 12   rOPINIRole (REQUIP) 1 MG tablet Take 1 mg by mouth 3 (three) times daily.      simvastatin (ZOCOR) 10 MG tablet Take 10 mg by mouth daily.     SUMAtriptan (IMITREX) 25 MG tablet Take 25 mg by mouth every 2 (two) hours as needed for migraine.     torsemide (DEMADEX) 20 MG tablet Take 1 tablet (20 mg total) by mouth daily as needed. May take an additional 20 mg daily as needed for extreme swelling. 180 tablet 3   No current facility-administered medications for this visit.   Allergies:  Penicillins and Septra [sulfamethoxazole-trimethoprim]   Social History: The patient  reports that she has been smoking  cigarettes. She started smoking about 53 years ago. She has a 26.00 pack-year smoking history. She has never used smokeless tobacco. She reports that she does not currently use alcohol after a past usage of about 1.0 standard drink per week. She reports that she does not use drugs.   Family History: The patient's family history is not on file. She was adopted.   ROS:  Please see the history of present illness. Otherwise, complete review of systems is positive for {NONE DEFAULTED:18576}.  All other systems are reviewed and negative.   Physical Exam: VS:  There were no vitals taken for this visit., BMI There is no height or weight on file to calculate BMI.  Wt Readings from Last 3 Encounters:  04/04/21 224 lb 13.9 oz (102 kg)  12/31/20 224 lb (101.6 kg)  11/05/20 225 lb (102.1 kg)    General: Patient appears comfortable at rest. HEENT: Conjunctiva and lids normal, oropharynx clear with moist mucosa. Neck: Supple, no elevated JVP or carotid bruits, no thyromegaly. Lungs: Clear to auscultation, nonlabored breathing at rest. Cardiac: Regular rate and rhythm, no S3 or significant systolic murmur, no pericardial rub. Abdomen: Soft, nontender, no hepatomegaly, bowel sounds present, no guarding or rebound. Extremities: No pitting edema, distal pulses 2+. Skin: Warm and dry. Musculoskeletal: No kyphosis. Neuropsychiatric: Alert and oriented x3, affect grossly appropriate.  ECG:  {EKG/Telemetry Strips Reviewed:[phone]}  Recent Labwork: 04/04/2021: ALT 20; AST 21; B Natriuretic Peptide 19.0; BUN 10; Creatinine, Ser 0.72; Hemoglobin 13.7; Platelets 298; Potassium 4.2; Sodium 132     Component Value Date/Time   CHOL 197 07/18/2015 1113   TRIG 144 07/18/2015 1113   HDL 41 07/18/2015 1113   CHOLHDL 4.8 (H) 07/18/2015 1113   CHOLHDL 3.1 03/07/2014 0343   VLDL 40 03/07/2014 0343   LDLCALC 127 (H) 07/18/2015 1113    Other Studies Reviewed Today:  ABI 06/14/2019 Summary:  Right: Resting  right ankle-brachial index is within normal range. No  evidence of significant right lower extremity arterial disease. The right  toe-brachial index is abnormal.   Left: Resting left ankle-brachial index is within normal range. No  evidence of significant left lower extremity arterial disease. The left  toe-brachial index is abnormal.      Echocardiogram  06/14/2019  1. Left  ventricular ejection fraction, by visual estimation, is 60 to  65%. The left ventricle has normal function. There is no left ventricular  hypertrophy.   2. The left ventricle has no regional wall motion abnormalities.   3. Global right ventricle has normal systolic function.The right  ventricular size is normal. No increase in right ventricular wall  thickness.   4. Left atrial size was upper normal.   5. Right atrial size was normal.   6. The mitral valve is grossly normal. Trivial mitral valve  regurgitation.   7. The tricuspid valve is grossly normal. Tricuspid valve regurgitation  is trivial.   8. The aortic valve is tricuspid. Aortic valve regurgitation is not  visualized.   9. The pulmonic valve was grossly normal. Pulmonic valve regurgitation is  trivial.  10. TR signal is inadequate for assessing pulmonary artery systolic  pressure.  11. The inferior vena cava is normal in size with greater than 50%  respiratory variability, suggesting right atrial pressure of 3 mmHg.   Assessment and Plan:  1. SOB (shortness of breath)   2. Chronic obstructive pulmonary disease, unspecified COPD type ([place])   3. Tobacco use disorder   4. Essential hypertension      Medication Adjustments/Labs and Tests Ordered: Current medicines are reviewed at length with the patient today.  Concerns regarding medicines are outlined above.   Disposition: Follow-up with ***  Signed, Levell July, NP 04/13/2021 7:54 PM    [facility], [place], [place] [address] Phone: [phone]; Fax: [phone]

## 2021-04-14 ENCOUNTER — Ambulatory Visit: Payer: 59 | Admitting: Family Medicine

## 2021-04-14 DIAGNOSIS — F17200 Nicotine dependence, unspecified, uncomplicated: Secondary | ICD-10-CM

## 2021-04-14 DIAGNOSIS — I10 Essential (primary) hypertension: Secondary | ICD-10-CM

## 2021-04-14 DIAGNOSIS — J449 Chronic obstructive pulmonary disease, unspecified: Secondary | ICD-10-CM

## 2021-04-14 DIAGNOSIS — R0602 Shortness of breath: Secondary | ICD-10-CM

## 2021-04-14 NOTE — Telephone Encounter (Signed)
Fine to order

## 2021-04-14 NOTE — Telephone Encounter (Signed)
Called and spoke with patient. She is aware that we will place the order. I asked her if she wanted the regular rollator or the one with the cart for oxygen attached since she's on oxygen. She would like to get the one with the oxygen cart attachment. Advised I would place this in the order.   Nothing further needed at time of call.

## 2021-04-16 NOTE — Progress Notes (Signed)
Cardiology Office Note  Date: 04/17/2021   ID: [name], DOB 05-20-1956, MRN [id number]  PCP:  Jani Gravel, MD  Cardiologist:  Carlyle Dolly, MD Electrophysiologist:  None   Chief Complaint: * hosptial follow up - CHF (Did not have lexiscan)  History of Present Illness: [name] is a 64 y.o. female with a history of HTN, COPD, chronic respiratory failure with hypoxia,, CHF, GERD, tobacco use disorder, hyperlipidemia, DM2, HLD, chronic diastolic CHF, coronary artery calcification on prior CT of the low risk nuclear stress test 2015.  She was last seen by Bernerd Pho, [place] 07/31/2020.  She continued to have dyspnea on exertion on chronic 4 L nasal cannula.  She was waiting to hear back from pulmonology regarding using BiPAP or CPAP at night if approved by her insurance.  She reported  some episodes of left-sided chest pressure which radiated into her left arm.  She stated it typically occurred at rest.  She took one of her husbands sublingual nitroglycerin the prior week with resolution of the symptoms.  Plan was for a [employer].  Her blood pressure was well controlled she was continuing current medication regimen with lisinopril 30 mg daily.  She was only taking torsemide if needed approximately once every few weeks.  Her hyperlipidemia was being followed by PCP she was continuing simvastatin 5 mg daily.  Recent PCP labs were requested.  She continued to smoke 1/2 pack/day of cigarettes.     Recent presentation to [facility] ED on 04/04/2021 for shortness of breath.  She arrived via EMS from home complaining of difficulty breathing.  She was chronically on 4 L nasal cannula.  She had lost power at home and did not have enough oxygen and left on her home today.  She had audible wheezing and edema in both lower extremities.  She received breathing treatments and was started on doxycycline and prednisone.  Troponins were negative x2.  WBC was 12.2, blood sugar  was 206, hemoglobin A1c 6.3, sodium was 132, BNP was 19, COVID PCR and influenza were negative, chest x-ray was negative.  Discharge diagnosis was COPD exacerbation.    She is here today with her husband.  She states she feels much better since the emergency room visit.  She was given prednisone and antibiotics sent which helped with her COPD exacerbation.  She has no clinical signs of heart failure.  She had a recent stress test and echocardiogram at [facility] in June 2022.  Stress test was negative.  Echocardiogram Showed EF of 60 to 65%, LA mild to moderately dilated, normal diastolic function, RV normal systolic function, there was a trivial circumferential pericardial effusion no evidence of tamponade.  She has a significant history of smoking and COPD and continues to smoke.  She is on chronic O2 and wearing it today.  Her blood pressure was 134/60 today.  She takes torsemide as needed for lower extremity edema.  Her weight is stable at 223.  She states her baseline weight is around 225.  She denies significant lower extremity edema.    Past Medical History:  Diagnosis Date   Anemia    Anxiety    Arthritis    Bipolar 1 disorder (HCC)    CHF (congestive heart failure) (HCC)    COPD (chronic obstructive pulmonary disease) (HCC)    Depression    Diabetes mellitus without complication (HCC)    Dyspnea    Emphysema of lung (HCC)    GERD (gastroesophageal reflux disease)  Headache    migraines   History of hiatal hernia    History of kidney stones    Hyperlipidemia    Hypertension    Neuromuscular disorder (HCC)    neuropathy   On home oxygen therapy    Pneumonia 2015, 2019   Tracheomalacia    Vaginal Pap smear, abnormal     Past Surgical History:  Procedure Laterality Date   CATARACT EXTRACTION W/PHACO Left 01/14/2020   Procedure: CATARACT EXTRACTION PHACO AND INTRAOCULAR LENS PLACEMENT LEFT EYE;  Surgeon: Baruch Goldmann, MD;  Location: [facility];  Service: Ophthalmology;  Laterality:  Left;  CDE: 8.18   CATARACT EXTRACTION W/PHACO Right 02/01/2020   Procedure: CATARACT EXTRACTION PHACO AND INTRAOCULAR LENS PLACEMENT RIGHT EYE;  Surgeon: Baruch Goldmann, MD;  Location: [facility];  Service: Ophthalmology;  Laterality: Right;  CDE: 9.94   CHEILECTOMY Right 01/28/2020   Procedure: KELLER BUNION IMPLANT RIGHT FOOT CHEILECTOMY RIGHT ROOT;  Surgeon: Landis Martins, DPM;  Location: [place];  Service: Podiatry;  Laterality: Right;  BLOCK   CHOLECYSTECTOMY     COLONOSCOPY  July 2010   Dr. Arnoldo Morale: 3 rectal polyps, not enough tissue for pathologic examination, recommended surveillance in 3 years   COLONOSCOPY N/A 10/23/2014   RMR: Multiple colonic polyps removed as described above. No endoscopic explaniation for abdominal pain. however. next tcs 10/2019   ESOPHAGOGASTRODUODENOSCOPY  July 2010   Dr. Arnoldo Morale: gastritis and duodenitis, H.pylori negative   ESOPHAGOGASTRODUODENOSCOPY N/A 10/23/2014   RMR: Normal EGD. Status post passage of a Maloney dilator. Today's finding s would not explain abdominal pain   EYE SURGERY Left 01/14/2020   cataract removal   GANGLION CYST EXCISION Left 09/07/2018   Procedure: REMOVAL GANGLION OF WRIST;  Surgeon: Carole Civil, MD;  Location: [facility];  Service: Orthopedics;  Laterality: Left;   HERNIA REPAIR     Dr. Arnoldo Morale   KIDNEY STONE SURGERY     MALONEY DILATION N/A 10/23/2014   Procedure: Venia Minks DILATION;  Surgeon: Daneil Dolin, MD;  Location: [facility];  Service: Endoscopy;  Laterality: N/A;   OPEN REDUCTION INTERNAL FIXATION (ORIF) DISTAL RADIAL FRACTURE Right 12/09/2015   Procedure: OPEN REDUCTION INTERNAL FIXATION (ORIF) RIGHT DISTAL RADIUS;  Surgeon: Leanora Cover, MD;  Location: [facility];  Service: Orthopedics;  Laterality: Right;    Current Outpatient Medications  Medication Sig Dispense Refill   Accu-Chek FastClix Lancets MISC USE TO CHECK BLOODOGLUCOSE ONCE DAILY     ACCU-CHEK GUIDE test strip USE TO CHECK BLOODEBLUCOSE  ONCE DAILY.     acetaminophen (TYLENOL) 500 MG tablet Take 1 tablet (500 mg total) by mouth every 6 (six) hours as needed. 30 tablet 0   albuterol (PROVENTIL) (2.5 MG/3ML) 0.083% nebulizer solution INHALE 1 VIAL VIA NEBULIZER EVERY 4 HOURS 525 mL 0   albuterol-ipratropium (COMBIVENT) 18-103 MCG/ACT inhaler Inhale 2 puffs into the lungs 4 (four) times daily.      ARIPiprazole (ABILIFY) 10 MG tablet Take 10 mg by mouth daily.     Budeson-Glycopyrrol-Formoterol (BREZTRI AEROSPHERE) 160-9-4.8 MCG/ACT AERO INHALE TWO PUFFS INTO THE LUNGS IN THE MORNING AND AT BEDTIME. 10.7 g 3   cetirizine (ZYRTEC) 10 MG tablet Take 10 mg by mouth daily as needed for allergies.      DULoxetine (CYMBALTA) 30 MG capsule Take 1 capsule by mouth daily.     fluticasone (FLONASE) 50 MCG/ACT nasal spray Place 1 spray into both nostrils 2 (two) times daily as needed for allergies or rhinitis. 16 g 6  gabapentin (NEURONTIN) 800 MG tablet Take 800 mg by mouth 3 (three) times daily.     hydrOXYzine (VISTARIL) 50 MG capsule TAKE 1 CAPSULE BY MOUTH THREE TIMES DAILY AS NEEDED FOR ANXIETY 90 capsule 0   ipratropium (ATROVENT) 0.02 % nebulizer solution INHALE (1) VIAL VIA NEBULIZER EVERY FOUR HOURS AS NEEDED. 437.5 mL 0   Ipratropium-Albuterol (COMBIVENT RESPIMAT) 20-100 MCG/ACT AERS respimat INHALE 1 PUFF INTO LUNGS EVERY 4 HOURS. 4 g 3   ipratropium-albuterol (DUONEB) 0.5-2.5 (3) MG/3ML SOLN Take 3 mLs by nebulization every 4 (four) hours as needed. 360 mL 5   lisinopril (ZESTRIL) 30 MG tablet Take 30 mg by mouth daily.      metFORMIN (GLUCOPHAGE) 1000 MG tablet Take 1,000 mg by mouth 2 (two) times daily.     omeprazole (PRILOSEC) 40 MG capsule 1 capsule 30 minutes before morning meal     OXYGEN Inhale 4.5 L into the lungs continuous.      PROAIR HFA 108 (90 Base) MCG/ACT inhaler Inhale 1-2 puffs into the lungs every 6 (six) hours as needed for wheezing or shortness of breath. 8 g 12   rOPINIRole (REQUIP) 1 MG tablet Take 1 mg by  mouth 3 (three) times daily.      simvastatin (ZOCOR) 10 MG tablet Take 10 mg by mouth daily.     SUMAtriptan (IMITREX) 25 MG tablet Take 25 mg by mouth every 2 (two) hours as needed for migraine.     torsemide (DEMADEX) 20 MG tablet Take 1 tablet (20 mg total) by mouth daily as needed. May take an additional 20 mg daily as needed for extreme swelling. 180 tablet 3   No current facility-administered medications for this visit.   Allergies:  Penicillins and Septra [sulfamethoxazole-trimethoprim]   Social History: The patient  reports that she has been smoking cigarettes. She started smoking about 53 years ago. She has a 26.00 pack-year smoking history. She has never used smokeless tobacco. She reports that she does not currently use alcohol after a past usage of about 1.0 standard drink per week. She reports that she does not use drugs.   Family History: The patient's family history is not on file. She was adopted.   ROS:  Please see the history of present illness. Otherwise, complete review of systems is positive for none.  All other systems are reviewed and negative.   Physical Exam: VS:  BP 134/60   Pulse 88   Ht 5\' 4"  (1.626 m)   Wt 223 lb 6.4 oz (101.3 kg)   SpO2 95%   BMI 38.35 kg/m , BMI Body mass index is 38.35 kg/m.  Wt Readings from Last 3 Encounters:  04/17/21 223 lb 6.4 oz (101.3 kg)  04/04/21 224 lb 13.9 oz (102 kg)  12/31/20 224 lb (101.6 kg)    General: Patient appears comfortable at rest. Neck: Supple, no elevated JVP or carotid bruits, no thyromegaly. Lungs: Bilateral inspiratory and expiratory wheezing, wearing continuous O2 nasal cannula today.  Nonlabored breathing at rest. Cardiac: Regular rate and rhythm, no S3 or significant systolic murmur, no pericardial rub. Extremities: No pitting edema, distal pulses 2+. Skin: Warm and dry. Musculoskeletal: No kyphosis. Neuropsychiatric: Alert and oriented x3, affect grossly appropriate.  ECG:  EKG 04/04/2021 sinus  rhythm rate of 91  Recent Labwork: 04/04/2021: ALT 20; AST 21; B Natriuretic Peptide 19.0; BUN 10; Creatinine, Ser 0.72; Hemoglobin 13.7; Platelets 298; Potassium 4.2; Sodium 132     Component Value Date/Time   CHOL 197 07/18/2015  1113   TRIG 144 07/18/2015 1113   HDL 41 07/18/2015 1113   CHOLHDL 4.8 (H) 07/18/2015 1113   CHOLHDL 3.1 03/07/2014 0343   VLDL 40 03/07/2014 0343   LDLCALC 127 (H) 07/18/2015 1113    Other Studies Reviewed Today:  Lexiscan Myoview stress test 12/12/2020 [facility] Impression  1. No significant ischemia or signs of infarction identified.   2. Normal left ventricular wall motion.   3. Left ventricular ejection fraction 67%   4. Non invasive risk stratification*: Low   *2012 Appropriate Use Criteria for Coronary Revascularization  Focused Update: J Am Coll Cardiol. 8546;27(0):350-093.  http://content.airportbarriers.com.aspx?articleid=[email or web address]       Echocardiogram 12/12/2020 [facility] Reason for Poor Study:     poor echocardiographic windows    Summary    1. Technically difficult study.    2. The left ventricle is normal in size with upper normal wall thickness.    3. The left ventricular systolic function is normal, LVEF is visually  estimated at 60-65%.    4. The left atrium is mildly to moderately dilated in size.    5. The right ventricle is normal in size, with normal systolic function.    Left Ventricle    The left ventricle is normal in size with upper normal wall thickness.    The left ventricular systolic function is normal, LVEF is visually estimated  at 60-65%.    There is normal left ventricular diastolic function.   Right Ventricle    The right ventricle is normal in size, with normal systolic function.    Left Atrium    The left atrium is mildly to moderately dilated in size.   Right Atrium    The right atrium is normal  in size.    Aortic Valve    The aortic valve is probably trileaflet with poorly  visualized but probably  normal leaflets with probably normal excursion.    There is no significant aortic regurgitation.    There is no evidence of a significant transvalvular gradient.   Pulmonic Valve    The pulmonic valve is normal.    There is no significant pulmonic regurgitation.    There is no evidence of a significant transvalvular gradient.   Mitral Valve    The mitral valve leaflets are normal with normal leaflet mobility.    There is no significant mitral valve regurgitation.   Tricuspid Valve    The tricuspid valve leaflets are normal, with normal leaflet mobility.    There is no significant tricuspid regurgitation.    Pulmonary systolic pressure cannot be estimated due to insufficient TR jet.    Other Findings    Rhythm: Sinus Rhythm.   Pericardium/Pleural    There is a trivial, circumferential pericardial effusion.    There is no echocardigraphic evidence of tamponade physiology.    Epicardial fat pad and or stranding.   Inferior Vena Cava    IVC size and inspiratory change suggest normal right atrial pressure. (0-5  mmHg).   Aorta    The aorta is normal in size in the visualized segments.       ABI 06/14/2019 Summary:  Right: Resting right ankle-brachial index is within normal range. No  evidence of significant right lower extremity arterial disease. The right  toe-brachial index is abnormal.   Left: Resting left ankle-brachial index is within normal range. No  evidence of significant left lower extremity arterial disease. The left  toe-brachial index is abnormal.      Echocardiogram  06/14/2019  1. Left ventricular ejection fraction, by visual estimation, is 60 to  65%. The left ventricle has normal function. There is no left ventricular  hypertrophy.   2. The left ventricle has no regional wall motion abnormalities.   3. Global right ventricle has normal systolic function.The right  ventricular size is normal. No increase in right ventricular  wall  thickness.   4. Left atrial size was upper normal.   5. Right atrial size was normal.   6. The mitral valve is grossly normal. Trivial mitral valve  regurgitation.   7. The tricuspid valve is grossly normal. Tricuspid valve regurgitation  is trivial.   8. The aortic valve is tricuspid. Aortic valve regurgitation is not  visualized.   9. The pulmonic valve was grossly normal. Pulmonic valve regurgitation is  trivial.  10. TR signal is inadequate for assessing pulmonary artery systolic  pressure.  11. The inferior vena cava is normal in size with greater than 50%  respiratory variability, suggesting right atrial pressure of 3 mmHg.   Assessment and Plan:  1. Chest pain, unspecified type   2. SOB (shortness of breath)   3. COPD exacerbation ([place])   4. Tobacco abuse   5. Essential hypertension    1. Chest pain, unspecified type Recent ED visit with shortness of breath and chest pain.  She was ruled out for ACS.  Troponins were negative x2.  BNP was 19.  She had a negative stress test at [facility] on December 12, 2020.  2. SOB (shortness of breath) Has chronic shortness of breath due to significant COPD.  In no acute respiratory distress today.  She is on chronic O2 continuously.  She had a recent echocardiogram on December 12, 2020. Echocardiogram showed EF of 60 to 65%, LA mild to moderately dilated, normal diastolic function, RV normal systolic function, there was a trivial circumferential pericardial effusion no evidence of tamponade.   3. COPD exacerbation ([place]) Recent ED visit for shortness of breath.  She was given prednisone and antibiotics.  She states this has improved her breathing.  On exam she has inspiratory and expiratory wheezing.  She has an upcoming visit with Dr. Melvyn Novas pulmonology in [place].  4. Tobacco abuse Significant history of smoking and continues to smoke.  Highly encouraged cessation.  5. Essential hypertension Blood pressure reasonably controlled  today at 134/60.  Continue lisinopril 30 mg daily.  Continue torsemide 20 mg as needed for lower extremity swelling.   Medication Adjustments/Labs and Tests Ordered: Current medicines are reviewed at length with the patient today.  Concerns regarding medicines are outlined above.   Disposition: Follow-up with Dr. Harl Bowie or APP 6 months  Signed, Levell July, NP 04/17/2021 8:13 AM    [facility], [place], [place] [address] Phone: [phone]; Fax: [phone]

## 2021-04-17 ENCOUNTER — Other Ambulatory Visit: Payer: Self-pay

## 2021-04-17 ENCOUNTER — Ambulatory Visit (INDEPENDENT_AMBULATORY_CARE_PROVIDER_SITE_OTHER): Payer: 59 | Admitting: Family Medicine

## 2021-04-17 ENCOUNTER — Encounter: Payer: Self-pay | Admitting: Family Medicine

## 2021-04-17 VITALS — BP 134/60 | HR 88 | Ht 64.0 in | Wt 223.4 lb

## 2021-04-17 DIAGNOSIS — I10 Essential (primary) hypertension: Secondary | ICD-10-CM

## 2021-04-17 DIAGNOSIS — Z720 Tobacco use: Secondary | ICD-10-CM

## 2021-04-17 DIAGNOSIS — J441 Chronic obstructive pulmonary disease with (acute) exacerbation: Secondary | ICD-10-CM

## 2021-04-17 DIAGNOSIS — R0602 Shortness of breath: Secondary | ICD-10-CM | POA: Diagnosis not present

## 2021-04-17 DIAGNOSIS — R079 Chest pain, unspecified: Secondary | ICD-10-CM

## 2021-04-17 MED ORDER — TORSEMIDE 20 MG PO TABS: 20.0000 mg | ORAL_TABLET | Freq: Every day | ORAL | 1 refills | Status: DC | PRN

## 2021-04-17 NOTE — Patient Instructions (Addendum)

## 2021-04-21 ENCOUNTER — Telehealth: Payer: Self-pay | Admitting: Pharmacy Technician

## 2021-04-21 ENCOUNTER — Other Ambulatory Visit (HOSPITAL_COMMUNITY): Payer: Self-pay

## 2021-04-21 NOTE — Telephone Encounter (Signed)
Received notification from [facility] regarding a prior authorization for BREZTRI. Authorization is not needed. Process through insurance as $0 copay.

## 2021-04-22 ENCOUNTER — Telehealth: Payer: Self-pay | Admitting: Internal Medicine

## 2021-04-22 NOTE — Telephone Encounter (Signed)
Left message for patient to call back  

## 2021-04-23 MED ORDER — PREDNISONE 10 MG PO TABS: 10.0000 mg | ORAL_TABLET | Freq: Every day | ORAL | 0 refills | Status: DC

## 2021-04-23 NOTE — Telephone Encounter (Signed)
She can have prednisone 40 mg x 4 days, 30 mg x 4 days 20 mg x 4 days 10 mg x 4 days.

## 2021-04-23 NOTE — Telephone Encounter (Signed)
Spoke with the pt and notified of response per [name]  Pt verbalized understanding  Rx was sent to pharm

## 2021-04-23 NOTE — Telephone Encounter (Signed)
Spoke with the pt  She states having increased SOB, wheezing and prod cough with yellow sputum for the past 2-3 days  She denies any HA, sore throat, f/c/s, aches  She has not taken covid test  Has had 3 covid vaccines  She states still taking her breztri 2 puffs bid and using neb a few times per day with little relief  She is still smoking some  Using her o2 4lpm and sats have remained above 90%  She is scheduled with Dr Melvyn Novas to est care in the [place] [facility] on 05/04/21  She does not want to come in for appt here is [place] since will be going to [place] soon  Please advise any recs, thanks!  Allergies  Allergen Reactions   Penicillins Other (See Comments)    Patient is unsure if she allergic to penicillin or septra. Patient states one or another caused "rib pain with a little breathing problem". Has patient had a PCN reaction causing immediate rash, facial/tongue/throat swelling, SOB or lightheadedness with hypotension: YES Has patient had a PCN reaction causing severe rash involving mucus membranes or skin necrosis: NO Has patient had a PCN reaction that required hospitalization: NO Has patient had a PCN reaction occurring within the last 10 years: NO   Septra [Sulfamethoxazole-Trimethoprim] Other (See Comments)    Patient is unsure if she allergic to septra or penicillin. Patient states one or another caused "rib pain with a little breathing problem".

## 2021-04-23 NOTE — Telephone Encounter (Signed)
Patient is returning phone call. Patient phone number is [phone].

## 2021-04-29 ENCOUNTER — Ambulatory Visit (HOSPITAL_COMMUNITY)
Admission: RE | Admit: 2021-04-29 | Discharge: 2021-04-29 | Disposition: A | Discharge status: Home / Self Care | Payer: 59 | Source: Ambulatory Visit | Attending: Internal Medicine | Admitting: Internal Medicine

## 2021-04-29 ENCOUNTER — Other Ambulatory Visit: Payer: Self-pay

## 2021-04-29 ENCOUNTER — Other Ambulatory Visit (HOSPITAL_COMMUNITY): Payer: Self-pay | Admitting: Internal Medicine

## 2021-04-29 DIAGNOSIS — R051 Acute cough: Secondary | ICD-10-CM

## 2021-05-04 ENCOUNTER — Other Ambulatory Visit: Payer: Self-pay

## 2021-05-04 ENCOUNTER — Encounter: Payer: Self-pay | Admitting: Internal Medicine

## 2021-05-04 ENCOUNTER — Telehealth: Payer: Self-pay | Admitting: Internal Medicine

## 2021-05-04 ENCOUNTER — Ambulatory Visit (INDEPENDENT_AMBULATORY_CARE_PROVIDER_SITE_OTHER): Payer: 59 | Admitting: Internal Medicine

## 2021-05-04 VITALS — BP 134/78 | HR 110 | Temp 98.4°F | Ht 64.0 in | Wt 226.1 lb

## 2021-05-04 DIAGNOSIS — J9611 Chronic respiratory failure with hypoxia: Secondary | ICD-10-CM

## 2021-05-04 DIAGNOSIS — J84115 Respiratory bronchiolitis interstitial lung disease: Secondary | ICD-10-CM | POA: Diagnosis not present

## 2021-05-04 DIAGNOSIS — I10 Essential (primary) hypertension: Secondary | ICD-10-CM

## 2021-05-04 DIAGNOSIS — J449 Chronic obstructive pulmonary disease, unspecified: Secondary | ICD-10-CM | POA: Diagnosis not present

## 2021-05-04 DIAGNOSIS — F17210 Nicotine dependence, cigarettes, uncomplicated: Secondary | ICD-10-CM

## 2021-05-04 MED ORDER — PREDNISONE 10 MG PO TABS: ORAL_TABLET | ORAL | 0 refills | Status: DC

## 2021-05-04 MED ORDER — OLMESARTAN MEDOXOMIL 20 MG PO TABS: 20.0000 mg | ORAL_TABLET | Freq: Every day | ORAL | 11 refills | Status: DC

## 2021-05-04 MED ORDER — STIOLTO RESPIMAT 2.5-2.5 MCG/ACT IN AERS: 2.0000 | INHALATION_SPRAY | Freq: Every day | RESPIRATORY_TRACT | 0 refills | Status: DC

## 2021-05-04 MED ORDER — METHYLPREDNISOLONE ACETATE 80 MG/ML IJ SUSP
80.0000 mg | Freq: Once | INTRAMUSCULAR | Status: AC
Start: 2021-05-04 — End: 2021-05-04
  Administered 2021-05-04: 80 mg via INTRAMUSCULAR

## 2021-05-04 NOTE — Assessment & Plan Note (Addendum)
Active smoker -  05/04/2021  After extensive coaching inhaler device,  effectiveness =  25% at best, 75% with respimat so try change to stiolto and off acei  DDX of  difficult airways management almost all start with A and  include Adherence, Ace Inhibitors, Acid Reflux, Active Sinus Disease, Alpha 1 Antitripsin deficiency, Anxiety masquerading as Airways dz,  ABPA,  Allergy(esp in young), Aspiration (esp in elderly), Adverse effects of meds,  Active smoking or vaping, A bunch of PE's (a small clot burden can't cause this syndrome unless there is already severe underlying pulm or vascular dz with poor reserve) plus two Bs  = Bronchiectasis and Beta blocker use..and one C= CHF  Adherence is always the initial "prime suspect" and is a multilayered concern that requires a "trust but verify" approach in every patient - starting with knowing how to use medications, especially inhalers, correctly, keeping up with refills and understanding the fundamental difference between maintenance and prns vs those medications only taken for a very short course and then stopped and not refilled.  - see hfa /smi coaching above - return with all meds in hand using a trust but verify approach to confirm accurate Medication  Reconciliation The principal here is that until we are certain that the  patients are doing what we've asked, it makes no sense to ask them to do more.   Active smoking at top of the rest of list - see sep a/p  ACEi adverse effects at the  top of the usual list of suspects and the only way to rule it out is a trial off > see a/p    ? Allergy > Prednisone 10 mg take  4 each am x 2 days,   2 each am x 2 days,  1 each am x 2 days and stop/ hold ICS for now since may be largely acei or smoking related AB  ? Acid (or non-acid) GERD > always difficult to exclude as up to 75% of pts in some series report no assoc GI/ Heartburn symptoms> rec continue max (24h)  acid suppression   ? Anxiety > usually at the  bottom of this list of usual suspects but note already on psychotropics and may interfere with adherence and also interpretation of response or lack thereof to symptom management which can be quite subjective.   ? chf > bnp very low last flare 04/04/21 so unlikely    >>> f/u in 6 weeks on stiolto trial

## 2021-05-04 NOTE — Assessment & Plan Note (Signed)
4-5 min discussion re active cigarette smoking in addition to office E&M  Ask about tobacco use:   going Advise quitting   I took an extended  opportunity with this patient to outline the consequences of continued cigarette use  in airway disorders based on all the data we have from the multiple national lung health studies (perfomed over decades at millions of dollars in cost)  indicating that smoking cessation, not choice of inhalers or physicians, is the most important aspect of her care.   Assess willingness:  Not committed at this point   Arrange follow up:   Follow up per Primary Care planned

## 2021-05-04 NOTE — Patient Instructions (Addendum)
Plan A = Automatic = Always=    stiolto 2 puffs 1st thing each am   Work on inhaler technique:  relax and gently blow all the way out then take a nice smooth full deep breath back in, triggering the inhaler at same time you start breathing in.  Hold for up to 5 seconds if you can.   Rinse and gargle with water when done.  If mouth or throat bother you at all,  try brushing teeth/gums/tongue with arm and hammer toothpaste/ make a slurry and gargle and spit out.       Plan B = Backup (to supplement plan A, not to replace it) Only use your albuterol inhaler (proair)as a rescue medication to be used if you can't catch your breath by resting or doing a relaxed purse lip breathing pattern.  - The less you use it, the better it will work when you need it. - Ok to use the inhaler up to 2 puffs  every 4 hours if you must but call for appointment if use goes up over your usual need - Don't leave home without it !!  (think of it like the spare tire for your car)    Plan C = Crisis (instead of Plan B but only if Plan B stops working) - only use your albuterol nebulizer if you first try Plan B and it fails to help > ok to use the nebulizer up to every 4 hours but if start needing it regularly call for immediate appointment   Stop lisinopril and call me today if you are not 30 mg daily of lisinopril   Benicar (olmesartan) 20 mg daily in place of lisinopril  Prednisone 10 mg take  4 each am x 2 days,   2 each am x 2 days,  1 each am x 2 days and stop   The key is to stop smoking completely before smoking completely stops you!   Make sure you check your oxygen saturation  at your highest level of activity  to be sure it stays over 90% and adjust  02 flow upward to maintain this level if needed but remember to turn it back to previous settings when you stop (to conserve your supply).      Please schedule a follow up office visit in 6 weeks, call sooner if needed

## 2021-05-04 NOTE — Assessment & Plan Note (Addendum)
D/c acei 05/04/2021 due to pseudowheeze  In the best review of chronic cough to date ( NEJM 2016 375 4098-1191) ,  ACEi are now felt to cause cough in up to  20% of pts which is a 4 fold increase from previous reports and does not include the variety of non-specific complaints we see in pulmonary clinic in pts on ACEi but previously attributed to another dx like  Copd/asthma and  include PNDS, throat and chest congestion, "bronchitis", unexplained dyspnea and noct "strangling" sensations, and hoarseness, but also  atypical /refractory GERD symptoms like dysphagia and "bad heartburn"   The only way I know  to prove this is not an "ACEi Case" is a trial off ACEi x a minimum of 6 weeks then regroup.   >>> try benicar 30 mg daily f/u by PCP w/in one month         Each maintenance medication was reviewed in detail including emphasizing most importantly the difference between maintenance and prns and under what circumstances the prns are to be triggered using an action plan format where appropriate.  Total time for H and P, chart review, counseling, reviewing hfa/smi device(s) and generating customized AVS unique to this office visit / same day charting = 45 min

## 2021-05-04 NOTE — Assessment & Plan Note (Signed)
05/04/2021  sats are fine at rest sitting on RA  Advised Make sure to check your oxygen saturation  at your highest level of activity  to be sure it stays over 90% and adjust  02 flow upward to maintain this level if needed but remember to turn it back to previous settings when you stop (to conserve your supply).

## 2021-05-04 NOTE — Telephone Encounter (Signed)
If not too late get pharmacy to change benicar  20 to the 20-12.5  - if too late, then let PCP adjust at next ov

## 2021-05-04 NOTE — Progress Notes (Signed)
[name], female    DOB: 03-06-1956,   MRN: [id number]   Brief patient profile:  20 yowf active smoker  referred to pulmonary clinic in [place]  05/04/2021 by Dr  Shearon Stalls   Previous patient of Dr. Luan Pulling for COPD. Has been on oxygen since 2018. years. 4-5LNC.  FEV1 51% of predicted 1.46L in 2018. She turns up oxygen when she gets short of breath.  Last Shaune Spittle 11/05/2020 Rec pulmonary rehab   History of Present Illness  05/04/2021  Pulmonary/ 1st office eval/ [name] /  Office re GOLD 2 copd  Chief Complaint  Patient presents with   Follow-up    4-4.5L O2 cont. SOB and cough have worsened because of congestion. Coughing up green mucus.   On room air pt was at 89%-90% after walk form lobby to room. 94% on 4L O2 cont.after sitting in room.   Prev. Seen dr. Shearon Stalls but [name] office is closer.    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    Also denies any obvious fluctuation of symptoms with weather or environmental changes or other aggravating or alleviating factors except as outlined above   No unusual exposure hx or h/o childhood pna/ asthma or knowledge of premature birth.  Current Allergies, Complete Past Medical History, Past Surgical History, Family History, and Social History were reviewed in [employer] record.  ROS  The following are not active complaints unless bolded Hoarseness, sore throat, dysphagia, dental problems, itching, sneezing,  nasal congestion or discharge of excess mucus or purulent secretions, ear ache,   fever, chills, sweats, unintended wt loss or wt gain,  classically pleuritic or exertional cp,  orthopnea pnd or arm/hand swelling  or leg swelling, presyncope, palpitations, abdominal pain, anorexia, nausea, vomiting, diarrhea  or change in bowel habits or change in bladder habits, change in stools or change in urine, dysuria, hematuria,  rash, arthralgias, visual complaints, headache, numbness, weakness or ataxia or problems with walking or coordination,  change in mood or  memory.            Past Medical History:  Diagnosis Date   Anemia    Anxiety    Arthritis    Bipolar 1 disorder (HCC)    CHF (congestive heart failure) (HCC)    COPD (chronic obstructive pulmonary disease) (HCC)    Depression    Diabetes mellitus without complication (HCC)    Dyspnea    Emphysema of lung (HCC)    GERD (gastroesophageal reflux disease)    Headache    migraines   History of hiatal hernia    History of kidney stones    Hyperlipidemia    Hypertension    Neuromuscular disorder (HCC)    neuropathy   On home oxygen therapy    Pneumonia 2015, 2019   Tracheomalacia    Vaginal Pap smear, abnormal     Outpatient Medications Prior to Visit  Medication Sig Dispense Refill   Accu-Chek FastClix Lancets MISC USE TO CHECK BLOODOGLUCOSE ONCE DAILY     ACCU-CHEK GUIDE test strip USE TO CHECK [place].  acetaminophen (TYLENOL) 500 MG tablet Take 1 tablet (500 mg total) by mouth every 6 (six) hours as needed. 30 tablet 0   albuterol (PROVENTIL) (2.5 MG/3ML) 0.083% nebulizer solution INHALE 1 VIAL VIA NEBULIZER EVERY 4 HOURS 525 mL 0   albuterol-ipratropium (COMBIVENT) 18-103 MCG/ACT inhaler Inhale 2 puffs into the lungs 4 (four) times daily.      ARIPiprazole (ABILIFY) 10 MG tablet Take 10 mg by mouth daily.     Budeson-Glycopyrrol-Formoterol (BREZTRI AEROSPHERE) 160-9-4.8 MCG/ACT AERO INHALE TWO PUFFS INTO THE LUNGS IN THE MORNING AND AT BEDTIME. 10.7 g 3   cetirizine (ZYRTEC) 10 MG tablet Take 10 mg by mouth daily as needed for allergies.       DULoxetine (CYMBALTA) 30 MG capsule Take 1 capsule by mouth daily.     fluticasone (FLONASE) 50 MCG/ACT nasal spray Place 1 spray into both nostrils 2 (two) times daily as needed for allergies or rhinitis. 16 g 6   gabapentin (NEURONTIN) 800 MG tablet Take 800 mg by mouth 3 (three) times daily.     hydrOXYzine (VISTARIL) 50 MG capsule TAKE 1 CAPSULE BY MOUTH THREE TIMES DAILY AS NEEDED FOR ANXIETY 90 capsule 0   ipratropium (ATROVENT) 0.02 % nebulizer solution INHALE (1) VIAL VIA NEBULIZER EVERY FOUR HOURS AS NEEDED. 437.5 mL 0   Ipratropium-Albuterol (COMBIVENT RESPIMAT) 20-100 MCG/ACT AERS respimat INHALE 1 PUFF INTO LUNGS EVERY 4 HOURS. 4 g 3   ipratropium-albuterol (DUONEB) 0.5-2.5 (3) MG/3ML SOLN Take 3 mLs by nebulization every 4 (four) hours as needed. 360 mL 5   lisinopril (ZESTRIL) 30 MG tablet Take 30 mg by mouth daily.      metFORMIN (GLUCOPHAGE) 1000 MG tablet Take 1,000 mg by mouth 2 (two) times daily.     omeprazole (PRILOSEC) 40 MG capsule 1 capsule 30 minutes before morning meal     OXYGEN Inhale 4.5 L into the lungs continuous.      predniSONE (DELTASONE) 10 MG tablet Take 1 tablet (10 mg total) by mouth daily with breakfast. 40 tablet 0   PROAIR HFA 108 (90 Base) MCG/ACT inhaler Inhale 1-2 puffs into the lungs every 6 (six) hours as needed for wheezing or shortness of breath. 8 g 12   rOPINIRole (REQUIP) 1 MG tablet Take 1 mg by mouth 3 (three) times daily.      simvastatin (ZOCOR) 10 MG tablet Take 10 mg by mouth daily.     SUMAtriptan (IMITREX) 25 MG tablet Take 25 mg by mouth every 2 (two) hours as needed for migraine.     torsemide (DEMADEX) 20 MG tablet Take 1 tablet (20 mg total) by mouth daily as needed. May take an additional 20 mg daily as needed for extreme swelling. 90 tablet 1   No facility-administered medications prior to visit.     Objective:     BP 134/78   Pulse (!) 110   Temp 98.4 F (36.9 C)   Ht 5\' 4"  (1.626 m)   Wt 226 lb 1.9 oz (102.6 kg)    SpO2 (!) 89% Comment: ra  BMI 38.81 kg/m   SpO2: (!) 89 % (ra) Amb mod obese wf with active psuedowheeze   HEENT : pt wearing mask not removed for exam due to covid - 19 concerns.    NECK :  without JVD/Nodes/TM/ nl carotid upstrokes bilaterally   LUNGS: no acc muscle use,  Mild barrel  contour chest wall with bilateral insp/exp  wheeze and  without cough on insp or exp maneuvers  and mild  Hyperresonant  to  percussion bilaterally     CV:  RRR  no s3 or murmur or increase in P2, and no edema   ABD:  soft and nontender with pos end  insp Hoover's  in the supine position. No bruits or organomegaly appreciated, bowel sounds nl  MS:   Nl gait/  ext warm without deformities, calf tenderness, cyanosis or clubbing No obvious joint restrictions   SKIN: warm and dry without lesions    NEURO:  alert, approp, nl sensorium with  no motor or cerebellar deficits apparent.          I personally reviewed images and agree with radiology impression as follows:  CXR:   Stable chronic interstitial changes.  No acute findings.    Assessment   COPD GOLD 2/ still smoker  Active smoker -  05/04/2021  After extensive coaching inhaler device,  effectiveness =  25% at best, 75% with respimat so try change to stiolto and off acei  DDX of  difficult airways management almost all start with A and  include Adherence, Ace Inhibitors, Acid Reflux, Active Sinus Disease, Alpha 1 Antitripsin deficiency, Anxiety masquerading as Airways dz,  ABPA,  Allergy(esp in young), Aspiration (esp in elderly), Adverse effects of meds,  Active smoking or vaping, A bunch of PE's (a small clot burden can't cause this syndrome unless there is already severe underlying pulm or vascular dz with poor reserve) plus two Bs  = Bronchiectasis and Beta blocker use..and one C= CHF  Adherence is always the initial "prime suspect" and is a multilayered concern that requires a "trust but verify" approach in every patient - starting with  knowing how to use medications, especially inhalers, correctly, keeping up with refills and understanding the fundamental difference between maintenance and prns vs those medications only taken for a very short course and then stopped and not refilled.  - see hfa /smi coaching above - return with all meds in hand using a trust but verify approach to confirm accurate Medication  Reconciliation The principal here is that until we are certain that the  patients are doing what we've asked, it makes no sense to ask them to do more.   Active smoking at top of the rest of list - see sep a/p  ACEi adverse effects at the  top of the usual list of suspects and the only way to rule it out is a trial off > see a/p    ? Allergy > Prednisone 10 mg take  4 each am x 2 days,   2 each am x 2 days,  1 each am x 2 days and stop/ hold ICS for now since may be largely acei or smoking related AB  ? Acid (or non-acid) GERD > always difficult to exclude as up to 75% of pts in some series report no assoc GI/ Heartburn symptoms> rec continue max (24h)  acid suppression   ? Anxiety > usually at the bottom of this list of usual suspects but note already on psychotropics and may interfere with adherence and also interpretation of response or lack thereof to symptom management which can be quite subjective.   ? chf > bnp very low last flare 04/04/21 so unlikely    >>> f/u in 6 weeks on stiolto trial     Chronic hypoxemic respiratory failure ([place])  05/04/2021  sats are fine at rest sitting on RA  Advised Make sure to check your oxygen saturation  at your  highest level of activity  to be sure it stays over 90% and adjust  02 flow upward to maintain this level if needed but remember to turn it back to previous settings when you stop (to conserve your supply).     Essential hypertension D/c acei 05/04/2021 due to pseudowheeze  In the best review of chronic cough to date ( NEJM 2016 375 9163-8466) ,  ACEi are now felt  to cause cough in up to  20% of pts which is a 4 fold increase from previous reports and does not include the variety of non-specific complaints we see in pulmonary clinic in pts on ACEi but previously attributed to another dx like  Copd/asthma and  include PNDS, throat and chest congestion, "bronchitis", unexplained dyspnea and noct "strangling" sensations, and hoarseness, but also  atypical /refractory GERD symptoms like dysphagia and "bad heartburn"   The only way I know  to prove this is not an "ACEi Case" is a trial off ACEi x a minimum of 6 weeks then regroup.   >>> try benicar 30 mg daily f/u by PCP w/in one month       Cigarette smoker 4-5 min discussion re active cigarette smoking in addition to office E&M  Ask about tobacco use:   going Advise quitting   I took an extended  opportunity with this patient to outline the consequences of continued cigarette use  in airway disorders based on all the data we have from the multiple national lung health studies (perfomed over decades at millions of dollars in cost)  indicating that smoking cessation, not choice of inhalers or physicians, is the most important aspect of her care.   Assess willingness:  Not committed at this point   Arrange follow up:   Follow up per Primary Care planned          Each maintenance medication was reviewed in detail including emphasizing most importantly the difference between maintenance and prns and under what circumstances the prns are to be triggered using an action plan format where appropriate.  Total time for H and P, chart review, counseling, reviewing hfa/smi device(s) and generating customized AVS unique to this office visit / same day charting = 45 min        Christinia Gully, MD 05/04/2021

## 2021-05-04 NOTE — Addendum Note (Signed)
Addended by: Fritzi Mandes D on: 05/04/2021 10:52 AM   Modules accepted: Orders

## 2021-05-05 MED ORDER — OLMESARTAN MEDOXOMIL-HCTZ 20-12.5 MG PO TABS: 1.0000 | ORAL_TABLET | Freq: Every day | ORAL | 11 refills | Status: DC

## 2021-05-05 NOTE — Telephone Encounter (Signed)
Called and spoke with [place] to see if patient has already received medication since Dr. Melvyn Novas wants to make a change to it. Was informed that patient received the medication yesterday. Pharmacist stated that she would cancel out the order from yesterday and for me to send new order and then next month patient would get the new dose of medication. New RX has been sent. Nothing further needed at this time.

## 2021-05-11 ENCOUNTER — Other Ambulatory Visit (HOSPITAL_COMMUNITY): Payer: Self-pay | Admitting: Neurosurgery

## 2021-05-11 DIAGNOSIS — M48062 Spinal stenosis, lumbar region with neurogenic claudication: Secondary | ICD-10-CM

## 2021-05-13 ENCOUNTER — Telehealth: Payer: Self-pay | Admitting: Internal Medicine

## 2021-05-13 MED ORDER — GUAIFENESIN ER 600 MG PO TB12: 1200.0000 mg | ORAL_TABLET | Freq: Two times a day (BID) | ORAL | 0 refills | Status: DC

## 2021-05-13 MED ORDER — AZITHROMYCIN 250 MG PO TABS: ORAL_TABLET | ORAL | 0 refills | Status: AC

## 2021-05-13 NOTE — Telephone Encounter (Signed)
Primary Pulmonologist: Dr. Melvyn Novas  Last office visit and with whom: 05/04/21 Dr. Melvyn Novas What do we see them for (pulmonary problems): ILD and COPD  Last OV assessment/plan: see below  Was appointment offered to patient (explain)?  No   Reason for call: Patient was seen on 05/04/21. Pt is still feeling congested, has wheezing and yellow mucus. No fever Has taken prescribed meds by Dr. Melvyn Novas, prednisone, and no OTC meds.  Wants to know if she can have an antibiotic.  Dr. Melvyn Novas please advise.   Allergies  Allergen Reactions   Penicillins Other (See Comments)    Patient is unsure if she allergic to penicillin or septra. Patient states one or another caused "rib pain with a little breathing problem". Has patient had a PCN reaction causing immediate rash, facial/tongue/throat swelling, SOB or lightheadedness with hypotension: YES Has patient had a PCN reaction causing severe rash involving mucus membranes or skin necrosis: NO Has patient had a PCN reaction that required hospitalization: NO Has patient had a PCN reaction occurring within the last 10 years: NO   Septra [Sulfamethoxazole-Trimethoprim] Other (See Comments)    Patient is unsure if she allergic to septra or penicillin. Patient states one or another caused "rib pain with a little breathing problem".    Immunization History  Administered Date(s) Administered   Influenza Whole 03/29/2019   Influenza-Unspecified 03/06/2019, 04/13/2021   Moderna Sars-Covid-2 Vaccination 02/22/2020, 04/18/2020   Pneumococcal Polysaccharide-23 07/06/2014   Tdap 11/30/2013      Assessment & Plan Note by Tanda Rockers, MD at 05/04/2021 10:47 AM  Author: Tanda Rockers, MD Author Type: Physician Filed: 05/04/2021 10:49 AM  Note Status: [place] List: Cosign Not Required Encounter Date: 05/04/2021  Problem: Essential hypertension  Editor: Tanda Rockers, MD (Physician)      Prior Versions: 1. Tanda Rockers, MD (Physician) at 05/04/2021 10:47 AM -  Written  D/c acei 05/04/2021 due to [place]   In the best review of chronic cough to date ( NEJM 2016 375 [id number]) ,  ACEi are now felt to cause cough in up to  20% of pts which is a 4 fold increase from previous reports and does not include the variety of non-specific complaints we see in pulmonary clinic in pts on ACEi but previously attributed to another dx like  Copd/asthma and  include PNDS, throat and chest congestion, "bronchitis", unexplained dyspnea and noct "strangling" sensations, and hoarseness, but also  atypical /refractory GERD symptoms like dysphagia and "bad heartburn"    The only way I know  to prove this is not an "ACEi Case" is a trial off ACEi x a minimum of 6 weeks then regroup.    >>> try benicar 30 mg daily f/u by PCP w/in one month           Each maintenance medication was reviewed in detail including emphasizing most importantly the difference between maintenance and prns and under what circumstances the prns are to be triggered using an action plan format where appropriate.   Total time for H and P, chart review, counseling, reviewing hfa/smi device(s) and generating customized AVS unique to this office visit / same day charting = 45 min             Assessment & Plan Note by Tanda Rockers, MD at 05/04/2021 10:48 AM  Author: Tanda Rockers, MD Author Type: Physician Filed: 05/04/2021 10:48 AM  Note Status: Written Cosign: Cosign Not Required Encounter Date: 05/04/2021  Problem: Cigarette smoker  Editor: Tanda Rockers, MD (Physician)             4-5 min discussion re active cigarette smoking in addition to office E&M   Ask about tobacco use:   going Advise quitting   I took an extended  opportunity with this patient to outline the consequences of continued cigarette use  in airway disorders based on all the data we have from the multiple national lung health studies (perfomed over decades at millions of dollars in cost)  indicating that smoking  cessation, not choice of inhalers or physicians, is the most important aspect of her care.   Assess willingness:  Not committed at this point   Arrange follow up:   Follow up per Primary Care planned               Assessment & Plan Note by Tanda Rockers, MD at 05/04/2021 10:45 AM  Author: Tanda Rockers, MD Author Type: Physician Filed: 05/04/2021 10:46 AM  Note Status: Written Cosign: Cosign Not Required Encounter Date: 05/04/2021  Problem: Chronic hypoxemic respiratory failure ([place])  Editor: Tanda Rockers, MD (Physician)              05/04/2021  sats are fine at rest sitting on RA   Advised Make sure to check your oxygen saturation  at your highest level of activity  to be sure it stays over 90% and adjust  02 flow upward to maintain this level if needed but remember to turn it back to previous settings when you stop (to conserve your supply).         Assessment & Plan Note by Tanda Rockers, MD at 05/04/2021 10:39 AM  Author: Tanda Rockers, MD Author Type: Physician Filed: 05/04/2021 10:45 AM  Note Status: [place] List: Cosign Not Required Encounter Date: 05/04/2021  Problem: COPD GOLD 2/ still smoker   Editor: Tanda Rockers, MD (Physician)      Prior Versions: 1. Tanda Rockers, MD (Physician) at 05/04/2021 10:44 AM - Written  Active smoker -  05/04/2021  After extensive coaching inhaler device,  effectiveness =  25% at best, 75% with respimat so try change to stiolto and off acei   DDX of  difficult airways management almost all start with A and  include Adherence, Ace Inhibitors, Acid Reflux, Active Sinus Disease, Alpha 1 Antitripsin deficiency, Anxiety masquerading as Airways dz,  ABPA,  Allergy(esp in young), Aspiration (esp in elderly), Adverse effects of meds,  Active smoking or vaping, A bunch of PE's (a small clot burden can't cause this syndrome unless there is already severe underlying pulm or vascular dz with poor reserve) plus two Bs  = Bronchiectasis  and Beta blocker use..and one C= CHF   Adherence is always the initial "prime suspect" and is a multilayered concern that requires a "trust but verify" approach in every patient - starting with knowing how to use medications, especially inhalers, correctly, keeping up with refills and understanding the fundamental difference between maintenance and prns vs those medications only taken for a very short course and then stopped and not refilled.  - see hfa /smi coaching above - return with all meds in hand using a trust but verify approach to confirm accurate Medication  Reconciliation The principal here is that until we are certain that the  patients are doing what we've asked, it makes no sense to ask them to do more.    Active smoking at top of the rest of  list - see sep a/p   ACEi adverse effects at the  top of the usual list of suspects and the only way to rule it out is a trial off > see a/p     ? Allergy > Prednisone 10 mg take  4 each am x 2 days,   2 each am x 2 days,  1 each am x 2 days and stop/ hold ICS for now since may be largely acei or smoking related AB   ? Acid (or non-acid) GERD > always difficult to exclude as up to 75% of pts in some series report no assoc GI/ Heartburn symptoms> rec continue max (24h)  acid suppression    ? Anxiety > usually at the bottom of this list of usual suspects but note already on psychotropics and may interfere with adherence and also interpretation of response or lack thereof to symptom management which can be quite subjective.    ? chf > bnp very low last flare 04/04/21 so unlikely      >>> f/u in 6 weeks on stiolto trial         Patient Instructions by Tanda Rockers, MD at 05/04/2021 10:00 AM  Author: Tanda Rockers, MD Author Type: Physician Filed: 05/04/2021 10:36 AM  Note Status: Addendum Cosign: Cosign Not Required Encounter Date: 05/04/2021  Editor: Tanda Rockers, MD (Physician)      Prior Versions: 1. Tanda Rockers, MD  (Physician) at 05/04/2021 10:29 AM - Addendum   2. Tanda Rockers, MD (Physician) at 05/04/2021 10:25 AM - Addendum   3. Tanda Rockers, MD (Physician) at 05/04/2021 10:23 AM - Signed  Plan A = Automatic = Always=    stiolto 2 puffs 1st thing each am    Work on inhaler technique:  relax and gently blow all the way out then take a nice smooth full deep breath back in, triggering the inhaler at same time you start breathing in.  Hold for up to 5 seconds if you can.   Rinse and gargle with water when done.  If mouth or throat bother you at all,  try brushing teeth/gums/tongue with arm and hammer toothpaste/ make a slurry and gargle and spit out.         Plan B = Backup (to supplement plan A, not to replace it) Only use your albuterol inhaler (proair)as a rescue medication to be used if you can't catch your breath by resting or doing a relaxed purse lip breathing pattern.  - The less you use it, the better it will work when you need it. - Ok to use the inhaler up to 2 puffs  every 4 hours if you must but call for appointment if use goes up over your usual need - Don't leave home without it !!  (think of it like the spare tire for your car)      Plan C = Crisis (instead of Plan B but only if Plan B stops working) - only use your albuterol nebulizer if you first try Plan B and it fails to help > ok to use the nebulizer up to every 4 hours but if start needing it regularly call for immediate appointment     Stop lisinopril and call me today if you are not 30 mg daily of lisinopril    Benicar (olmesartan) 20 mg daily in place of lisinopril   Prednisone 10 mg take  4 each am x 2 days,   2 each am  x 2 days,  1 each am x 2 days and stop    The key is to stop smoking completely before smoking completely stops you!    Make sure you check your oxygen saturation  at your highest level of activity  to be sure it stays over 90% and adjust  02 flow upward to maintain this level if needed but remember to  turn it back to previous settings when you stop (to conserve your supply).

## 2021-05-13 NOTE — Telephone Encounter (Signed)
Z pak / mucinex dm 1200 mg every 12 hours as needed for cough  Make sure she is not smoking and that she has made the change to benicar (olmesartan)

## 2021-05-13 NOTE — Telephone Encounter (Signed)
I called the patient and she is appreciative of the call back ands she is aware that she will pick up the medication at the pharmacy. The patient did not have any other questions.

## 2021-05-14 ENCOUNTER — Other Ambulatory Visit: Payer: Self-pay

## 2021-05-14 ENCOUNTER — Ambulatory Visit (INDEPENDENT_AMBULATORY_CARE_PROVIDER_SITE_OTHER): Payer: 59 | Admitting: Orthopedic Surgery

## 2021-05-14 ENCOUNTER — Encounter: Payer: Self-pay | Admitting: Orthopedic Surgery

## 2021-05-14 DIAGNOSIS — M25562 Pain in left knee: Secondary | ICD-10-CM

## 2021-05-14 DIAGNOSIS — G8929 Other chronic pain: Secondary | ICD-10-CM

## 2021-05-14 DIAGNOSIS — M25561 Pain in right knee: Secondary | ICD-10-CM

## 2021-05-14 DIAGNOSIS — M170 Bilateral primary osteoarthritis of knee: Secondary | ICD-10-CM

## 2021-05-14 NOTE — Progress Notes (Signed)
65 yo female DM, COPD, 02 DEPENDENT   Encounter Diagnoses  Name Primary?   Bilateral chronic knee pain Yes   Primary osteoarthritis of both knees     Chief Complaint  Patient presents with   Injections    Both knees     Procedure note for bilateral knee injections  Procedure note left knee injection verbal consent was obtained to inject left knee joint  Timeout was completed to confirm the site of injection  The medications used were  Anesthesia was provided by ethyl chloride and the skin was prepped with alcohol.  After cleaning the skin with alcohol a 20-gauge needle was used to inject the left knee joint. There were no complications. A sterile bandage was applied.   Procedure note right knee injection verbal consent was obtained to inject right knee joint  Timeout was completed to confirm the site of injection  The medications used were  40 MG DEPOMEDROL AND 1% LIDOCAINE  Anesthesia was provided by ethyl chloride and the skin was prepped with alcohol.  After cleaning the skin with alcohol a 20-gauge needle was used to inject the right knee joint. There were no complications. A sterile bandage was applied.   F/U PRN

## 2021-05-27 ENCOUNTER — Ambulatory Visit (HOSPITAL_COMMUNITY): Payer: 59

## 2021-05-27 ENCOUNTER — Encounter (HOSPITAL_COMMUNITY): Payer: Self-pay

## 2021-06-08 ENCOUNTER — Other Ambulatory Visit: Payer: Self-pay

## 2021-06-08 ENCOUNTER — Telehealth: Payer: Self-pay | Admitting: Internal Medicine

## 2021-06-08 MED ORDER — AZITHROMYCIN 250 MG PO TABS: ORAL_TABLET | ORAL | 0 refills | Status: AC

## 2021-06-08 MED ORDER — PREDNISONE 10 MG PO TABS: ORAL_TABLET | ORAL | 0 refills | Status: AC

## 2021-06-08 NOTE — Telephone Encounter (Signed)
Primary Pulmonologist: Dr. Melvyn Novas  Last office visit and with whom: Dr. Melvyn Novas 05-04-2021 What do we see them for (pulmonary problems): Cough/ SOB/ ILD/ Resp. Bronchiolitis  Last OV assessment/plan: see below  Was appointment offered to patient (explain)?  no   Reason for call: Patient is calling asking for antibiotic and prednisone. States her congestion has been back for about 3 days. Coughing up yellow mucus. No fever. Wheezing. No covid test done. No OTC meds.   Dr. Melvyn Novas please advise   (examples of things to ask: : When did symptoms start? Fever? Cough? Productive? Color to sputum? More sputum than usual? Wheezing? Have you needed increased oxygen? Are you taking your respiratory medications? What over the counter measures have you tried?)  Allergies  Allergen Reactions   Penicillins Other (See Comments)    Patient is unsure if she allergic to penicillin or septra. Patient states one or another caused "rib pain with a little breathing problem". Has patient had a PCN reaction causing immediate rash, facial/tongue/throat swelling, SOB or lightheadedness with hypotension: YES Has patient had a PCN reaction causing severe rash involving mucus membranes or skin necrosis: NO Has patient had a PCN reaction that required hospitalization: NO Has patient had a PCN reaction occurring within the last 10 years: NO   Septra [Sulfamethoxazole-Trimethoprim] Other (See Comments)    Patient is unsure if she allergic to septra or penicillin. Patient states one or another caused "rib pain with a little breathing problem".    Immunization History  Administered Date(s) Administered   Influenza Whole 03/29/2019   Influenza-Unspecified 03/06/2019, 04/13/2021   Moderna Sars-Covid-2 Vaccination 02/22/2020, 04/18/2020   Pneumococcal Polysaccharide-23 07/06/2014   Tdap 11/30/2013    Assessment & Plan Note by Tanda Rockers, MD at 05/04/2021 10:47 AM  Author: Tanda Rockers, MD Author Type: Physician Filed:  05/04/2021 10:49 AM  Note Status: Bernell List: Cosign Not Required Encounter Date: 05/04/2021  Problem: Essential hypertension  Editor: Tanda Rockers, MD (Physician)      Prior Versions: 1. Tanda Rockers, MD (Physician) at 05/04/2021 10:47 AM - Written  D/c acei 05/04/2021 due to [place]   In the best review of chronic cough to date ( NEJM 2016 375 1544-1551) ,  ACEi are now felt to cause cough in up to  20% of pts which is a 4 fold increase from previous reports and does not include the variety of non-specific complaints we see in pulmonary clinic in pts on ACEi but previously attributed to another dx like  Copd/asthma and  include PNDS, throat and chest congestion, "bronchitis", unexplained dyspnea and noct "strangling" sensations, and hoarseness, but also  atypical /refractory GERD symptoms like dysphagia and "bad heartburn"    The only way I know  to prove this is not an "ACEi Case" is a trial off ACEi x a minimum of 6 weeks then regroup.    >>> try benicar 30 mg daily f/u by PCP w/in one month           Each maintenance medication was reviewed in detail including emphasizing most importantly the difference between maintenance and prns and under what circumstances the prns are to be triggered using an action plan format where appropriate.   Total time for H and P, chart review, counseling, reviewing hfa/smi device(s) and generating customized AVS unique to this office visit / same day charting = 45 min             Assessment & Plan Note by Melvyn Novas,  Christena Deem, MD at 05/04/2021 10:48 AM  Author: Tanda Rockers, MD Author Type: Physician Filed: 05/04/2021 10:48 AM  Note Status: Written Cosign: Cosign Not Required Encounter Date: 05/04/2021  Problem: Cigarette smoker  Editor: Tanda Rockers, MD (Physician)             4-5 min discussion re active cigarette smoking in addition to office E&M   Ask about tobacco use:   going Advise quitting   I took an extended  opportunity  with this patient to outline the consequences of continued cigarette use  in airway disorders based on all the data we have from the multiple national lung health studies (perfomed over decades at millions of dollars in cost)  indicating that smoking cessation, not choice of inhalers or physicians, is the most important aspect of her care.   Assess willingness:  Not committed at this point   Arrange follow up:   Follow up per Primary Care planned               Assessment & Plan Note by Tanda Rockers, MD at 05/04/2021 10:45 AM  Author: Tanda Rockers, MD Author Type: Physician Filed: 05/04/2021 10:46 AM  Note Status: Written Cosign: Cosign Not Required Encounter Date: 05/04/2021  Problem: Chronic hypoxemic respiratory failure ([place])  Editor: Tanda Rockers, MD (Physician)              05/04/2021  sats are fine at rest sitting on RA   Advised Make sure to check your oxygen saturation  at your highest level of activity  to be sure it stays over 90% and adjust  02 flow upward to maintain this level if needed but remember to turn it back to previous settings when you stop (to conserve your supply).         Assessment & Plan Note by Tanda Rockers, MD at 05/04/2021 10:39 AM  Author: Tanda Rockers, MD Author Type: Physician Filed: 05/04/2021 10:45 AM  Note Status: Bernell List: Cosign Not Required Encounter Date: 05/04/2021  Problem: COPD GOLD 2/ still smoker   Editor: Tanda Rockers, MD (Physician)      Prior Versions: 1. Tanda Rockers, MD (Physician) at 05/04/2021 10:44 AM - Written  Active smoker -  05/04/2021  After extensive coaching inhaler device,  effectiveness =  25% at best, 75% with respimat so try change to stiolto and off acei   DDX of  difficult airways management almost all start with A and  include Adherence, Ace Inhibitors, Acid Reflux, Active Sinus Disease, Alpha 1 Antitripsin deficiency, Anxiety masquerading as Airways dz,  ABPA,  Allergy(esp in young),  Aspiration (esp in elderly), Adverse effects of meds,  Active smoking or vaping, A bunch of PE's (a small clot burden can't cause this syndrome unless there is already severe underlying pulm or vascular dz with poor reserve) plus two Bs  = Bronchiectasis and Beta blocker use..and one C= CHF   Adherence is always the initial "prime suspect" and is a multilayered concern that requires a "trust but verify" approach in every patient - starting with knowing how to use medications, especially inhalers, correctly, keeping up with refills and understanding the fundamental difference between maintenance and prns vs those medications only taken for a very short course and then stopped and not refilled.  - see hfa /smi coaching above - return with all meds in hand using a trust but verify approach to confirm accurate Medication  Reconciliation The principal here  is that until we are certain that the  patients are doing what we've asked, it makes no sense to ask them to do more.    Active smoking at top of the rest of list - see sep a/p   ACEi adverse effects at the  top of the usual list of suspects and the only way to rule it out is a trial off > see a/p     ? Allergy > Prednisone 10 mg take  4 each am x 2 days,   2 each am x 2 days,  1 each am x 2 days and stop/ hold ICS for now since may be largely acei or smoking related AB   ? Acid (or non-acid) GERD > always difficult to exclude as up to 75% of pts in some series report no assoc GI/ Heartburn symptoms> rec continue max (24h)  acid suppression    ? Anxiety > usually at the bottom of this list of usual suspects but note already on psychotropics and may interfere with adherence and also interpretation of response or lack thereof to symptom management which can be quite subjective.    ? chf > bnp very low last flare 04/04/21 so unlikely      >>> f/u in 6 weeks on stiolto trial

## 2021-06-08 NOTE — Telephone Encounter (Signed)
Called and spoke to patient she voiced understanding. Meds sent to [facility] after verifying pharmacy with patient. Nothing further needed at this time.

## 2021-06-08 NOTE — Telephone Encounter (Signed)
Zpak Prednisone 10 mg take  4 each am x 2 days,   2 each am x 2 days,  1 each am x 2 days and stop  Be sure keeps f/u ov as rec in last avs with all meds in hand

## 2021-06-08 NOTE — Telephone Encounter (Signed)
Pt calling back regarding congestion.  Wanting antibiotic or prednisone.  Please advise.

## 2021-06-09 ENCOUNTER — Other Ambulatory Visit (HOSPITAL_COMMUNITY): Payer: 59

## 2021-06-09 ENCOUNTER — Telehealth: Payer: 59 | Admitting: Cardiology

## 2021-06-09 MED ORDER — BLOOD PRESSURE MONITOR MISC: 1.0000 | 0 refills | Status: DC

## 2021-06-09 NOTE — Telephone Encounter (Signed)
Pt called back to advise to [place] to send the prescription to [place] [phone]

## 2021-06-09 NOTE — Telephone Encounter (Signed)
BP MONITOR RX FAXED TO [facility] @[phone]

## 2021-06-09 NOTE — Telephone Encounter (Signed)
New Message:     Patient says she need a prescription to get a blood pressure cuff please.

## 2021-06-09 NOTE — Telephone Encounter (Signed)
[place] to obtain information about services for blood pressure monitors and to get fax number Spoke with Gwendalyn Ege at [facility] and says they do supply BP monitors with a prescription from provider stating why its needed. Fax # is [phone]

## 2021-06-11 ENCOUNTER — Ambulatory Visit: Payer: Medicare Other | Admitting: Sports Medicine

## 2021-06-11 NOTE — Telephone Encounter (Signed)
   Pt calling, she said she spoke with [facility] and was told they did not receive the RX for her BP cuff, she request to refax the order

## 2021-06-12 ENCOUNTER — Telehealth: Payer: Self-pay | Admitting: Internal Medicine

## 2021-06-12 NOTE — Telephone Encounter (Signed)
Called and spoke with Kennyth Lose to let her know that OV notes have been faxed over. Nothing further needed at this time.

## 2021-06-15 ENCOUNTER — Other Ambulatory Visit: Payer: Self-pay

## 2021-06-15 MED ORDER — BLOOD PRESSURE MONITOR MISC: 1.0000 | 0 refills | Status: DC

## 2021-06-15 NOTE — Progress Notes (Signed)
BP Cuff Reordered.

## 2021-06-15 NOTE — Telephone Encounter (Signed)
BP Cuff order faxed to [facility]

## 2021-06-15 NOTE — Telephone Encounter (Signed)
   Pt is calling back, she said [facility] and they still haven't receive rx for BF cuff, she said, if nurse can fax RX to [facility] and hopefully she can get it there sooner

## 2021-06-18 ENCOUNTER — Ambulatory Visit: Payer: 59 | Admitting: Internal Medicine

## 2021-06-19 ENCOUNTER — Telehealth: Payer: Self-pay | Admitting: Internal Medicine

## 2021-06-19 NOTE — Telephone Encounter (Signed)
Needs to go to ER at this point if was actually seen by PCP  If not seen by PCP then ok to try zpak/ Prednisone 10 mg take  4 each am x 2 days,   2 each am x 2 days,  1 each am x 2 days and stop   > to er if sats not > 88% at rest or after using neb up to every 4 h and still can't get comfortable

## 2021-06-19 NOTE — Telephone Encounter (Signed)
Patient stated that she seen PCP yesterday.  Advised ED as instructed by Dr. Melvyn Novas.  She voiced her understanding. Nothing further needed.    Routing to MW as an Pharmacist, hospital.

## 2021-06-19 NOTE — Telephone Encounter (Signed)
Primary Pulmonologist: Dr. Melvyn Novas  Last office visit and with whom: 05/04/21 [place]  What do we see them for (pulmonary problems): Resp. Bronchiolitis associated interstitial lung disease  Last OV assessment/plan: see below   Was appointment offered to patient (explain)?  No    Reason for call: Coughing up yellow mucus, a lot of wheezing, no OTC meds. 4LO2 cont. Normally but is now using 5LO2 cont. O2 sats in low 90s. BP was high during last pcp ov but cant remember what it was. States that her PCP told her yesterday to got to the ER but patient is trying to avoid emergency room if possible. Is asking for antibiotic and more prednisone.   Dr. Melvyn Novas please advise   Allergies  Allergen Reactions   Penicillins Other (See Comments)    Patient is unsure if she allergic to penicillin or septra. Patient states one or another caused "rib pain with a little breathing problem". Has patient had a PCN reaction causing immediate rash, facial/tongue/throat swelling, SOB or lightheadedness with hypotension: YES Has patient had a PCN reaction causing severe rash involving mucus membranes or skin necrosis: NO Has patient had a PCN reaction that required hospitalization: NO Has patient had a PCN reaction occurring within the last 10 years: NO   Septra [Sulfamethoxazole-Trimethoprim] Other (See Comments)    Patient is unsure if she allergic to septra or penicillin. Patient states one or another caused "rib pain with a little breathing problem".    Immunization History  Administered Date(s) Administered   Influenza Whole 03/29/2019   Influenza-Unspecified 03/06/2019, 04/13/2021   Moderna Sars-Covid-2 Vaccination 02/22/2020, 04/18/2020   Pneumococcal Polysaccharide-23 07/06/2014   Tdap 11/30/2013       Assessment & Plan Note by Tanda Rockers, MD at 05/04/2021 10:47 AM  Author: Tanda Rockers, MD Author Type: Physician Filed: 05/04/2021 10:49 AM  Note Status: Bernell List: Cosign Not Required Encounter  Date: 05/04/2021  Problem: Essential hypertension  Editor: Tanda Rockers, MD (Physician)      Prior Versions: 1. Tanda Rockers, MD (Physician) at 05/04/2021 10:47 AM - Written  D/c acei 05/04/2021 due to [place]   In the best review of chronic cough to date ( NEJM 2016 375 1544-1551) ,  ACEi are now felt to cause cough in up to  20% of pts which is a 4 fold increase from previous reports and does not include the variety of non-specific complaints we see in pulmonary clinic in pts on ACEi but previously attributed to another dx like  Copd/asthma and  include PNDS, throat and chest congestion, "bronchitis", unexplained dyspnea and noct "strangling" sensations, and hoarseness, but also  atypical /refractory GERD symptoms like dysphagia and "bad heartburn"    The only way I know  to prove this is not an "ACEi Case" is a trial off ACEi x a minimum of 6 weeks then regroup.    >>> try benicar 30 mg daily f/u by PCP w/in one month           Each maintenance medication was reviewed in detail including emphasizing most importantly the difference between maintenance and prns and under what circumstances the prns are to be triggered using an action plan format where appropriate.   Total time for H and P, chart review, counseling, reviewing hfa/smi device(s) and generating customized AVS unique to this office visit / same day charting = 45 min             Assessment & Plan Note by Melvyn Novas,  Christena Deem, MD at 05/04/2021 10:48 AM  Author: Tanda Rockers, MD Author Type: Physician Filed: 05/04/2021 10:48 AM  Note Status: Written Cosign: Cosign Not Required Encounter Date: 05/04/2021  Problem: Cigarette smoker  Editor: Tanda Rockers, MD (Physician)             4-5 min discussion re active cigarette smoking in addition to office E&M   Ask about tobacco use:   going Advise quitting   I took an extended  opportunity with this patient to outline the consequences of continued cigarette use  in  airway disorders based on all the data we have from the multiple national lung health studies (perfomed over decades at millions of dollars in cost)  indicating that smoking cessation, not choice of inhalers or physicians, is the most important aspect of her care.   Assess willingness:  Not committed at this point   Arrange follow up:   Follow up per Primary Care planned               Assessment & Plan Note by Tanda Rockers, MD at 05/04/2021 10:45 AM  Author: Tanda Rockers, MD Author Type: Physician Filed: 05/04/2021 10:46 AM  Note Status: Written Cosign: Cosign Not Required Encounter Date: 05/04/2021  Problem: Chronic hypoxemic respiratory failure ([place])  Editor: Tanda Rockers, MD (Physician)              05/04/2021  sats are fine at rest sitting on RA   Advised Make sure to check your oxygen saturation  at your highest level of activity  to be sure it stays over 90% and adjust  02 flow upward to maintain this level if needed but remember to turn it back to previous settings when you stop (to conserve your supply).         Assessment & Plan Note by Tanda Rockers, MD at 05/04/2021 10:39 AM  Author: Tanda Rockers, MD Author Type: Physician Filed: 05/04/2021 10:45 AM  Note Status: [place]ell List: Cosign Not Required Encounter Date: 05/04/2021  Problem: COPD GOLD 2/ still smoker   Editor: Tanda Rockers, MD (Physician)      Prior Versions: 1. Tanda Rockers, MD (Physician) at 05/04/2021 10:44 AM - Written  Active smoker -  05/04/2021  After extensive coaching inhaler device,  effectiveness =  25% at best, 75% with respimat so try change to stiolto and off acei   DDX of  difficult airways management almost all start with A and  include Adherence, Ace Inhibitors, Acid Reflux, Active Sinus Disease, Alpha 1 Antitripsin deficiency, Anxiety masquerading as Airways dz,  ABPA,  Allergy(esp in young), Aspiration (esp in elderly), Adverse effects of meds,  Active smoking or vaping, A  bunch of PE's (a small clot burden can't cause this syndrome unless there is already severe underlying pulm or vascular dz with poor reserve) plus two Bs  = Bronchiectasis and Beta blocker use..and one C= CHF   Adherence is always the initial "prime suspect" and is a multilayered concern that requires a "trust but verify" approach in every patient - starting with knowing how to use medications, especially inhalers, correctly, keeping up with refills and understanding the fundamental difference between maintenance and prns vs those medications only taken for a very short course and then stopped and not refilled.  - see hfa /smi coaching above - return with all meds in hand using a trust but verify approach to confirm accurate Medication  Reconciliation The principal here  is that until we are certain that the  patients are doing what we've asked, it makes no sense to ask them to do more.    Active smoking at top of the rest of list - see sep a/p   ACEi adverse effects at the  top of the usual list of suspects and the only way to rule it out is a trial off > see a/p     ? Allergy > Prednisone 10 mg take  4 each am x 2 days,   2 each am x 2 days,  1 each am x 2 days and stop/ hold ICS for now since may be largely acei or smoking related AB   ? Acid (or non-acid) GERD > always difficult to exclude as up to 75% of pts in some series report no assoc GI/ Heartburn symptoms> rec continue max (24h)  acid suppression    ? Anxiety > usually at the bottom of this list of usual suspects but note already on psychotropics and may interfere with adherence and also interpretation of response or lack thereof to symptom management which can be quite subjective.    ? chf > bnp very low last flare 04/04/21 so unlikely      >>> f/u in 6 weeks on stiolto trial         Patient Instructions by Tanda Rockers, MD at 05/04/2021 10:00 AM  Author: Tanda Rockers, MD Author Type: Physician Filed: 05/04/2021 10:36 AM   Note Status: Addendum Cosign: Cosign Not Required Encounter Date: 05/04/2021  Editor: Tanda Rockers, MD (Physician)      Prior Versions: 1. Tanda Rockers, MD (Physician) at 05/04/2021 10:29 AM - Addendum   2. Tanda Rockers, MD (Physician) at 05/04/2021 10:25 AM - Addendum   3. Tanda Rockers, MD (Physician) at 05/04/2021 10:23 AM - Signed  Plan A = Automatic = Always=    stiolto 2 puffs 1st thing each am    Work on inhaler technique:  relax and gently blow all the way out then take a nice smooth full deep breath back in, triggering the inhaler at same time you start breathing in.  Hold for up to 5 seconds if you can.   Rinse and gargle with water when done.  If mouth or throat bother you at all,  try brushing teeth/gums/tongue with arm and hammer toothpaste/ make a slurry and gargle and spit out.         Plan B = Backup (to supplement plan A, not to replace it) Only use your albuterol inhaler (proair)as a rescue medication to be used if you can't catch your breath by resting or doing a relaxed purse lip breathing pattern.  - The less you use it, the better it will work when you need it. - Ok to use the inhaler up to 2 puffs  every 4 hours if you must but call for appointment if use goes up over your usual need - Don't leave home without it !!  (think of it like the spare tire for your car)      Plan C = Crisis (instead of Plan B but only if Plan B stops working) - only use your albuterol nebulizer if you first try Plan B and it fails to help > ok to use the nebulizer up to every 4 hours but if start needing it regularly call for immediate appointment     Stop lisinopril and call me today if you are  not 30 mg daily of lisinopril    Benicar (olmesartan) 20 mg daily in place of lisinopril   Prednisone 10 mg take  4 each am x 2 days,   2 each am x 2 days,  1 each am x 2 days and stop    The key is to stop smoking completely before smoking completely stops you!    Make sure you  check your oxygen saturation  at your highest level of activity  to be sure it stays over 90% and adjust  02 flow upward to maintain this level if needed but remember to turn it back to previous settings when you stop (to conserve your supply).       Please schedule a follow up office visit in 6 weeks, call sooner if needed

## 2021-06-19 NOTE — Telephone Encounter (Signed)
Hasn't checked BP since taking lisinopril this a.m.  She takes lisinopril HCTZ 20 mg -12.5.  Please advise.  [employer].

## 2021-06-22 ENCOUNTER — Ambulatory Visit (HOSPITAL_COMMUNITY)
Admission: RE | Admit: 2021-06-22 | Discharge: 2021-06-22 | Disposition: A | Discharge status: Home / Self Care | Payer: 59 | Source: Ambulatory Visit | Attending: Neurosurgery | Admitting: Neurosurgery

## 2021-06-22 ENCOUNTER — Other Ambulatory Visit: Payer: Self-pay

## 2021-06-22 DIAGNOSIS — M48062 Spinal stenosis, lumbar region with neurogenic claudication: Secondary | ICD-10-CM | POA: Insufficient documentation

## 2021-07-01 ENCOUNTER — Telehealth: Payer: Self-pay | Admitting: Internal Medicine

## 2021-07-01 NOTE — Telephone Encounter (Signed)
Dr Roderic Palau Thomas-neurosurgery calling to discus pt's surgical candidate status with MW. Please advise [phone]

## 2021-07-01 NOTE — Telephone Encounter (Signed)
Advised no smoking x 2 weeks as a strong rec to reduce post op complications but otherwise no contraindication to surgery especially if bad pain

## 2021-07-02 NOTE — Telephone Encounter (Signed)
Gold 2 copd/ smoker. Advised no smoking x 2 weeks preop and she should be relatively low risk for back surgery

## 2021-07-09 NOTE — Telephone Encounter (Signed)
OV notes and clearance form have been faxed back to [facility]. Nothing further needed at this time.

## 2021-07-13 ENCOUNTER — Other Ambulatory Visit: Payer: Self-pay | Admitting: Internal Medicine

## 2021-07-14 ENCOUNTER — Telehealth: Payer: Self-pay | Admitting: Internal Medicine

## 2021-07-14 ENCOUNTER — Other Ambulatory Visit: Payer: Self-pay | Admitting: Internal Medicine

## 2021-07-14 MED ORDER — PREDNISONE 10 MG PO TABS: ORAL_TABLET | ORAL | 0 refills | Status: AC

## 2021-07-14 MED ORDER — DOXYCYCLINE HYCLATE 100 MG PO TABS: 100.0000 mg | ORAL_TABLET | Freq: Two times a day (BID) | ORAL | 0 refills | Status: DC

## 2021-07-14 NOTE — Telephone Encounter (Signed)
Spoke with pt who states she does not drive and would be unable to come to [facility] until next Tuesday. Pt was scheduled with Marland Kitchen on Tuesday 07/21/21 at [facility] verified by pt and pred taper and doxy orders were placed. Nothing further needed at this time.

## 2021-07-14 NOTE — Telephone Encounter (Signed)
Ideally needs ov with me or NP this week with all meds in hand  If can't be done then  Prednisone 10 mg take  4 each am x 2 days,   2 each am x 2 days,  1 each am x 2 days and stop and doxy 100 mg bidx 7days

## 2021-07-14 NOTE — Telephone Encounter (Signed)
Spoke with pt who states she has had a productive cough/ increased SOB/ increased wheezing and chest tightness over the last 3 - 4 days. She is taking rescue and Stiolto as directed. Pt denies fever/chills/ GI upset. Pt is not currently taking any OTCs. Dr. Melvyn Novas please advise

## 2021-07-21 ENCOUNTER — Ambulatory Visit: Payer: 59 | Admitting: Nurse Practitioner

## 2021-07-21 ENCOUNTER — Telehealth: Payer: Self-pay | Admitting: Internal Medicine

## 2021-07-21 MED ORDER — STIOLTO RESPIMAT 2.5-2.5 MCG/ACT IN AERS: 2.0000 | INHALATION_SPRAY | Freq: Every day | RESPIRATORY_TRACT | 2 refills | Status: DC

## 2021-07-21 MED ORDER — PREDNISONE 10 MG PO TABS: ORAL_TABLET | ORAL | 0 refills | Status: DC

## 2021-07-21 NOTE — Telephone Encounter (Signed)
Primary Pulmonologist: Dr. Melvyn Novas  Last office visit and with whom: Dr. Melvyn Novas 05/04/2021 What do we see them for (pulmonary problems): COPD  Last OV assessment/plan: see below   Was appointment offered to patient (explain)?  Yes. Patient missed appt today and was offered a 9am appt tomorrow in [facility] office but cannot make appt due to transportation.    Reason for call: patient called stating her breathing and congestion is getting worse. States she is still coughing up yellow mucus and has increased wheezing and SOB since sick call on 07/14/21   Had appt with Roxan Diesel NP today but had to cancel due to transportation issues. Offered patient appt for tomorrow at 9am in [facility] office but patient cannot attend appt due to transportation.   Would also like refill on stiolto inhaler.   Dr. Melvyn Novas please advise   Allergies  Allergen Reactions   Penicillins Other (See Comments)    Patient is unsure if she allergic to penicillin or septra. Patient states one or another caused "rib pain with a little breathing problem". Has patient had a PCN reaction causing immediate rash, facial/tongue/throat swelling, SOB or lightheadedness with hypotension: YES Has patient had a PCN reaction causing severe rash involving mucus membranes or skin necrosis: NO Has patient had a PCN reaction that required hospitalization: NO Has patient had a PCN reaction occurring within the last 10 years: NO   Septra [Sulfamethoxazole-Trimethoprim] Other (See Comments)    Patient is unsure if she allergic to septra or penicillin. Patient states one or another caused "rib pain with a little breathing problem".    Immunization History  Administered Date(s) Administered   Influenza Whole 03/29/2019   Influenza-Unspecified 03/06/2019, 04/13/2021   Moderna Sars-Covid-2 Vaccination 02/22/2020, 04/18/2020   Pneumococcal Polysaccharide-23 07/06/2014   Tdap 11/30/2013     Assessment    COPD GOLD 2/ still smoker  Active smoker -   05/04/2021  After extensive coaching inhaler device,  effectiveness =  25% at best, 75% with respimat so try change to stiolto and off acei   DDX of  difficult airways management almost all start with A and  include Adherence, Ace Inhibitors, Acid Reflux, Active Sinus Disease, Alpha 1 Antitripsin deficiency, Anxiety masquerading as Airways dz,  ABPA,  Allergy(esp in young), Aspiration (esp in elderly), Adverse effects of meds,  Active smoking or vaping, A bunch of PE's (a small clot burden can't cause this syndrome unless there is already severe underlying pulm or vascular dz with poor reserve) plus two Bs  = Bronchiectasis and Beta blocker use..and one C= CHF   Adherence is always the initial "prime suspect" and is a multilayered concern that requires a "trust but verify" approach in every patient - starting with knowing how to use medications, especially inhalers, correctly, keeping up with refills and understanding the fundamental difference between maintenance and prns vs those medications only taken for a very short course and then stopped and not refilled.  - see hfa /smi coaching above - return with all meds in hand using a trust but verify approach to confirm accurate Medication  Reconciliation The principal here is that until we are certain that the  patients are doing what we've asked, it makes no sense to ask them to do more.    Active smoking at top of the rest of list - see sep a/p   ACEi adverse effects at the  top of the usual list of suspects and the only way to rule it out is a  trial off > see a/p     ? Allergy > Prednisone 10 mg take  4 each am x 2 days,   2 each am x 2 days,  1 each am x 2 days and stop/ hold ICS for now since may be largely acei or smoking related AB   ? Acid (or non-acid) GERD > always difficult to exclude as up to 75% of pts in some series report no assoc GI/ Heartburn symptoms> rec continue max (24h)  acid suppression    ? Anxiety > usually at the bottom of  this list of usual suspects but note already on psychotropics and may interfere with adherence and also interpretation of response or lack thereof to symptom management which can be quite subjective.    ? chf > bnp very low last flare 04/04/21 so unlikely      >>> f/u in 6 weeks on stiolto trial        Chronic hypoxemic respiratory failure ([place])  05/04/2021  sats are fine at rest sitting on RA   Advised Make sure to check your oxygen saturation  at your highest level of activity  to be sure it stays over 90% and adjust  02 flow upward to maintain this level if needed but remember to turn it back to previous settings when you stop (to conserve your supply).        Essential hypertension D/c acei 05/04/2021 due to pseudowheeze   In the best review of chronic cough to date ( NEJM 2016 375 [id number]) ,  ACEi are now felt to cause cough in up to  20% of pts which is a 4 fold increase from previous reports and does not include the variety of non-specific complaints we see in pulmonary clinic in pts on ACEi but previously attributed to another dx like  Copd/asthma and  include PNDS, throat and chest congestion, "bronchitis", unexplained dyspnea and noct "strangling" sensations, and hoarseness, but also  atypical /refractory GERD symptoms like dysphagia and "bad heartburn"    The only way I know  to prove this is not an "ACEi Case" is a trial off ACEi x a minimum of 6 weeks then regroup.    >>> try benicar 30 mg daily f/u by PCP w/in one month         Cigarette smoker 4-5 min discussion re active cigarette smoking in addition to office E&M   Ask about tobacco use:   going Advise quitting   I took an extended  opportunity with this patient to outline the consequences of continued cigarette use  in airway disorders based on all the data we have from the multiple national lung health studies (perfomed over decades at millions of dollars in cost)  indicating that smoking cessation, not choice of  inhalers or physicians, is the most important aspect of her care.   Assess willingness:  Not committed at this point   Arrange follow up:   Follow up per Primary Care planned

## 2021-07-21 NOTE — Progress Notes (Deleted)
@Patient  ID: [name], female    DOB: [birth year], 66 y.o.   MRN: [id number]  No chief complaint on file.   Referring provider: Jani Gravel, MD  HPI: 66 year old female, current every day smoker (26 year hx) followed for COPD, respiratory bronchiolitis associated ILD, airway malacia, and chronic hypoxemic respiratory failure on supplemental oxygen. She is a patient of Dr. Gustavus Bryant and was last seen in office on 05/04/2021. Past medical history significant for HTN, dysphagia, GERD, DM II, lumbar radiculopathy, panic disorder, tobacco abuse, alcohol abuse, bipolar disorder, anxiety.   TEST/EVENTS:   05/04/2021: Initial consult with Dr. Melvyn Novas. Previously seen by Dr. Shearon Stalls in [place] but [place] office closer for patient. On 4-4.5lpm [facility]. 89% after walk from lobby on room air. Trial off ACEi and switch Benicar 30 mg daily. Changed to [employer]. Prednisone taper. Smoking cessation.     Allergies  Allergen Reactions   Penicillins Other (See Comments)    Patient is unsure if she allergic to penicillin or septra. Patient states one or another caused "rib pain with a little breathing problem". Has patient had a PCN reaction causing immediate rash, facial/tongue/throat swelling, SOB or lightheadedness with hypotension: YES Has patient had a PCN reaction causing severe rash involving mucus membranes or skin necrosis: NO Has patient had a PCN reaction that required hospitalization: NO Has patient had a PCN reaction occurring within the last 10 years: NO   Septra [Sulfamethoxazole-Trimethoprim] Other (See Comments)    Patient is unsure if she allergic to septra or penicillin. Patient states one or another caused "rib pain with a little breathing problem".    Immunization History  Administered Date(s) Administered   Influenza Whole 03/29/2019   Influenza-Unspecified 03/06/2019, 04/13/2021   Moderna Sars-Covid-2 Vaccination 02/22/2020, 04/18/2020   Pneumococcal Polysaccharide-23 07/06/2014    Tdap 11/30/2013    Past Medical History:  Diagnosis Date   Anemia    Anxiety    Arthritis    Bipolar 1 disorder ([place])    CHF (congestive heart failure) (H[facility])    COPD (chronic obstructive pulmonary disease) ([place])    Depression    Diabetes mellitus without complication (H[facility])    Dyspnea    Emphysema of lung (H[facility])    GERD (gastroesophageal reflux disease)    Headache    migraines   History of hiatal hernia    History of kidney stones    Hyperlipidemia    Hypertension    Neuromuscular disorder (H[facility])    neuropathy   On home oxygen therapy    Pneumonia 2015, 2019   Tracheomalacia    Vaginal Pap smear, abnormal     Tobacco History: Social History   Tobacco Use  Smoking Status Every Day   Packs/day: 0.50   Years: 52.00   Pack years: 26.00   Types: Cigarettes   Start date: 1969  Smokeless Tobacco Never  Tobacco Comments   06/19/20- 10-12 cigarettes a day    Ready to quit: Not Answered Counseling given: Not Answered Tobacco comments: 06/19/20- 10-12 cigarettes a day    Outpatient Medications Prior to Visit  Medication Sig Dispense Refill   Accu-Chek FastClix Lancets MISC USE TO CHECK BLOODOGLUCOSE ONCE DAILY     ACCU-CHEK GUIDE test strip USE TO CHECK BLOODEBLUCOSE ONCE DAILY.     acetaminophen (TYLENOL) 500 MG tablet Take 1 tablet (500 mg total) by mouth every 6 (six) hours as needed. 30 tablet 0   albuterol (PROVENTIL) (2.5 MG/3ML) 0.083% nebulizer solution INHALE 1 VIAL VIA NEBULIZER EVERY  4 HOURS 360 mL 2   ARIPiprazole (ABILIFY) 10 MG tablet Take 10 mg by mouth daily.     Blood Pressure Monitor MISC 1 each by Does not apply route as directed. Dx: Hypertension 1 each 0   cetirizine (ZYRTEC) 10 MG tablet Take 10 mg by mouth daily as needed for allergies.      COMBIVENT RESPIMAT 20-100 MCG/ACT AERS respimat INHALE 1 PUFF INTO LUNGS EVERY 4 HOURS. 4 g 3   doxycycline (VIBRA-TABS) 100 MG tablet Take 1 tablet (100 mg total) by mouth 2 (two) times daily. 14 tablet 0    DULoxetine (CYMBALTA) 30 MG capsule Take 1 capsule by mouth daily.     fluticasone (FLONASE) 50 MCG/ACT nasal spray Place 1 spray into both nostrils 2 (two) times daily as needed for allergies or rhinitis. 16 g 6   gabapentin (NEURONTIN) 800 MG tablet Take 800 mg by mouth 3 (three) times daily.     guaiFENesin (MUCINEX) 600 MG 12 hr tablet Take 2 tablets (1,200 mg total) by mouth every 12 (twelve) hours. 20 tablet 0   hydrOXYzine (VISTARIL) 50 MG capsule TAKE 1 CAPSULE BY MOUTH THREE TIMES DAILY AS NEEDED FOR ANXIETY 90 capsule 0   ipratropium (ATROVENT) 0.02 % nebulizer solution INHALE (1) VIAL VIA NEBULIZER EVERY FOUR HOURS AS NEEDED. 437.5 mL 3   metFORMIN (GLUCOPHAGE) 1000 MG tablet Take 1,000 mg by mouth 2 (two) times daily.     olmesartan (BENICAR) 20 MG tablet Take 1 tablet (20 mg total) by mouth daily. 30 tablet 11   olmesartan-hydrochlorothiazide (BENICAR HCT) 20-12.5 MG tablet Take 1 tablet by mouth daily. 30 tablet 11   omeprazole (PRILOSEC) 40 MG capsule 1 capsule 30 minutes before morning meal     OXYGEN Inhale 4.5 L into the lungs continuous.      PROAIR HFA 108 (90 Base) MCG/ACT inhaler Inhale 1-2 puffs into the lungs every 6 (six) hours as needed for wheezing or shortness of breath. 8 g 12   rOPINIRole (REQUIP) 1 MG tablet Take 1 mg by mouth 3 (three) times daily.      simvastatin (ZOCOR) 10 MG tablet Take 10 mg by mouth daily.     SUMAtriptan (IMITREX) 25 MG tablet Take 25 mg by mouth every 2 (two) hours as needed for migraine.     Tiotropium Bromide-Olodaterol (STIOLTO RESPIMAT) 2.5-2.5 MCG/ACT AERS Inhale 2 puffs into the lungs daily. 4 g 0   torsemide (DEMADEX) 20 MG tablet Take 1 tablet (20 mg total) by mouth daily as needed. May take an additional 20 mg daily as needed for extreme swelling. 90 tablet 1   No facility-administered medications prior to visit.     Review of Systems:   Constitutional: No weight loss or gain, night sweats, fevers, chills, fatigue, or  lassitude. HEENT: No headaches, difficulty swallowing, tooth/dental problems, or sore throat. No sneezing, itching, ear ache, nasal congestion, or post nasal drip CV:  No chest pain, orthopnea, PND, swelling in lower extremities, anasarca, dizziness, palpitations, syncope Resp: No shortness of breath with exertion or at rest. No excess mucus or change in color of mucus. No productive or non-productive. No hemoptysis. No wheezing.  No chest wall deformity GI:  No heartburn, indigestion, abdominal pain, nausea, vomiting, diarrhea, change in bowel habits, loss of appetite, bloody stools.  GU: No dysuria, change in color of urine, urgency or frequency.  No flank pain, no hematuria  Skin: No rash, lesions, ulcerations MSK:  No joint pain or swelling.  No decreased range of motion.  No back pain. Neuro: No dizziness or lightheadedness.  Psych: No depression or anxiety. Mood stable.     Physical Exam:  There were no vitals taken for this visit.  GEN: Pleasant, interactive, well-nourished/chronically-ill appearing/acutely-ill appearing/poorly-nourished/morbidly obese; in no acute distress.****** HEENT:  Normocephalic and atraumatic. EACs patent bilaterally. TM pearly gray with present light reflex bilaterally. PERRLA. Sclera white. Nasal turbinates pink, moist and patent bilaterally. No rhinorrhea present. Oropharynx pink and moist, without exudate or edema. No lesions, ulcerations, or postnasal drip.  NECK:  Supple w/ fair ROM. No JVD present. Normal carotid impulses w/o bruits. Thyroid symmetrical with no goiter or nodules palpated. No lymphadenopathy.   CV: RRR, no m/r/g, no peripheral edema. Pulses intact, +2 bilaterally. No cyanosis, pallor or clubbing. PULMONARY:  Unlabored, regular breathing. Clear bilaterally A&P w/o wheezes/rales/rhonchi. No accessory muscle use. No dullness to percussion. GI: BS present and normoactive. Soft, non-tender to palpation. No organomegaly or masses detected. No CVA  tenderness. MSK: No erythema, warmth or tenderness. Cap refil <2 sec all extrem. No deformities or joint swelling noted.  Neuro: A/Ox3. No focal deficits noted.   Skin: Warm, no lesions or rashe Psych: Normal affect and behavior. Judgement and thought content appropriate.     Lab Results:  CBC    Component Value Date/Time   WBC 12.2 (H) 04/04/2021 0040   RBC 4.28 04/04/2021 0040   HGB 13.7 04/04/2021 0040   HGB 15.7 07/18/2015 1113   HCT 41.5 04/04/2021 0040   HCT 44.8 07/18/2015 1113   PLT 298 04/04/2021 0040   PLT 250 07/18/2015 1113   MCV 97.0 04/04/2021 0040   MCV 96 07/18/2015 1113   MCH 32.0 04/04/2021 0040   MCHC 33.0 04/04/2021 0040   RDW 12.2 04/04/2021 0040   RDW 14.5 07/18/2015 1113   LYMPHSABS 3.5 04/04/2021 0040   LYMPHSABS 1.6 07/18/2015 1113   MONOABS 0.9 04/04/2021 0040   EOSABS 0.3 04/04/2021 0040   EOSABS 0.2 07/18/2015 1113   BASOSABS 0.1 04/04/2021 0040   BASOSABS 0.0 07/18/2015 1113    BMET    Component Value Date/Time   NA 132 (L) 04/04/2021 0040   NA 140 07/18/2015 1113   K 4.2 04/04/2021 0040   CL 93 (L) 04/04/2021 0040   CO2 30 04/04/2021 0040   GLUCOSE 141 (H) 04/04/2021 0040   BUN 10 04/04/2021 0040   BUN 11 07/18/2015 1113   CREATININE 0.72 04/04/2021 0040   CREATININE 0.57 11/20/2014 1311   CALCIUM 11.4 (H) 04/04/2021 0040   GFRNONAA >60 04/04/2021 0040   GFRAA >60 01/23/2020 1119    BNP    Component Value Date/Time   BNP 19.0 04/04/2021 0040     Imaging:  MR LUMBAR SPINE WO CONTRAST  Result Date: 06/23/2021 CLINICAL DATA:  66 year old female with low back pain radiating down both legs for 2 months. No known injury. EXAM: MRI LUMBAR SPINE WITHOUT CONTRAST TECHNIQUE: Multiplanar, multisequence MR imaging of the lumbar spine was performed. No intravenous contrast was administered. COMPARISON:  Lumbar MRI 06/11/2019. Thoracolumbar radiographs 07/13/2019. FINDINGS: Segmentation: Normal on the comparison radiographs which is  the same numbering system used on the 2020 MRI. Alignment: Chronic anterolisthesis of L4 on L5, measures 7 mm now versus 5-6 mm in 2020. Elsewhere straightening of lordosis is stable. Vertebrae: No marrow edema or evidence of acute osseous abnormality. Visualized bone marrow signal is within normal limits. Intact visible sacrum and SI joints. Conus medullaris and cauda equina: Conus extends to  the L1-L2 level. No lower spinal cord or conus signal abnormality. Paraspinal and other soft tissues: Negative. Disc levels: T12-L1:  Negative. L1-L2:  Negative. L2-L3:  Stable subtle disc bulging.  No stenosis. L3-L4: Stable mild disc desiccation and circumferential disc bulge. Stable mild facet and ligament flavum hypertrophy. No stenosis. L4-L5: Chronic anterolisthesis with disc desiccation, disc space loss, and bulky disc/pseudo disc. Left greater than right foraminal involvement as in 2020. Superimposed moderate to severe facet and ligament flavum hypertrophy with chronic facet joint fluid (series 8, image 23). Less epidural lipomatosis compared to 2020 but ongoing moderate to severe spinal stenosis with fairly symmetric lateral recess stenosis. Moderate to severe left and moderate right L4 foraminal stenosis has not significantly changed. L5-S1: Minimal disc bulging and endplate spurring. Moderate facet hypertrophy greater on the right. Trace new right side facet joint fluid. Mild epidural lipomatosis. No spinal or lateral recess stenosis. No convincing foraminal stenosis. IMPRESSION: 1. Chronic anterolisthesis at L4-L5 appears slightly increased since a 2020 MRI, with advanced disc, endplate, and posterior element degeneration. Subsequent chronic severe spinal stenosis with symmetric lateral recess involvement, and moderate to severe foraminal stenosis greater on the left. 2. Moderate facet degeneration at L5-S1 without associated stenosis. 3. Minimal lumbar spine degeneration elsewhere. Electronically Signed   By: Genevie Ann M.D.   On: 06/23/2021 05:58      PFT Results Latest Ref Rng & Units 07/29/2020 07/14/2016 12/06/2013  FVC-Pre L 1.48 1.48 -  FVC-Predicted Pre % 46 45 52  FVC-Post L 1.58 - 1.93  FVC-Predicted Post % 49 - 57  Pre FEV1/FVC % % 81 87 82  Post FEV1/FCV % % 77 - 82  FEV1-Pre L 1.19 1.29 1.46  FEV1-Predicted Pre % 49 51 55  FEV1-Post L 1.22 - 1.58  DLCO uncorrected ml/min/mmHg 10.04 - 14.71  DLCO UNC% % 50 - 60  DLCO corrected ml/min/mmHg 10.23 - 14.75  DLCO COR %Predicted % 51 - 60  DLVA Predicted % 82 - 100  TLC L 4.94 - 3.73  TLC % Predicted % 97 - 73  RV % Predicted % 158 - 99    No results found for: NITRICOXIDE      Assessment & Plan:   No problem-specific Assessment & Plan notes found for this encounter.     Clayton Bibles, NP 07/21/2021  Pt aware and understands NP's role.

## 2021-07-21 NOTE — Telephone Encounter (Signed)
Called and spoke with patient. She verbalized understanding. Prednisone and Stiolto have been sent to her pharmacy. Nothing further needed at time of call.

## 2021-07-21 NOTE — Telephone Encounter (Signed)
The condition she has is directly related to smoking the there is no antedote so also I can offer is Prednisone 10 mg take  4 each am x 2 days,   2 each am x 2 days,  1 each am x 2 days and stop and f/u in [place] office with all meds in hand before trying more meds over the phone

## 2021-08-04 ENCOUNTER — Ambulatory Visit (INDEPENDENT_AMBULATORY_CARE_PROVIDER_SITE_OTHER): Payer: Medicare Other | Admitting: Internal Medicine

## 2021-08-04 ENCOUNTER — Encounter: Payer: Self-pay | Admitting: Internal Medicine

## 2021-08-04 ENCOUNTER — Other Ambulatory Visit: Payer: Self-pay

## 2021-08-04 VITALS — BP 140/88 | HR 96 | Temp 98.6°F | Wt 230.1 lb

## 2021-08-04 DIAGNOSIS — J84115 Respiratory bronchiolitis interstitial lung disease: Secondary | ICD-10-CM

## 2021-08-04 DIAGNOSIS — F17210 Nicotine dependence, cigarettes, uncomplicated: Secondary | ICD-10-CM

## 2021-08-04 DIAGNOSIS — J449 Chronic obstructive pulmonary disease, unspecified: Secondary | ICD-10-CM

## 2021-08-04 DIAGNOSIS — J9611 Chronic respiratory failure with hypoxia: Secondary | ICD-10-CM | POA: Diagnosis not present

## 2021-08-04 MED ORDER — AZITHROMYCIN 250 MG PO TABS: ORAL_TABLET | ORAL | 0 refills | Status: DC

## 2021-08-04 MED ORDER — METHYLPREDNISOLONE ACETATE 80 MG/ML IJ SUSP
120.0000 mg | Freq: Once | INTRAMUSCULAR | Status: AC
  Administered 2021-08-04: 120 mg via INTRAMUSCULAR

## 2021-08-04 MED ORDER — STIOLTO RESPIMAT 2.5-2.5 MCG/ACT IN AERS: 2.0000 | INHALATION_SPRAY | Freq: Every day | RESPIRATORY_TRACT | 0 refills | Status: DC

## 2021-08-04 NOTE — Assessment & Plan Note (Addendum)
Active smoker -  05/04/2021   try change to stiolto and off acei - 08/04/2021  After extensive coaching inhaler device,  effectiveness =  75% with stiolto from a baseline of 25%   DDX of  difficult airways management almost all start with A and  include Adherence, Ace Inhibitors, Acid Reflux, Active Sinus Disease, Alpha 1 Antitripsin deficiency, Anxiety masquerading as Airways dz,  ABPA,  Allergy(esp in young), Aspiration (esp in elderly), Adverse effects of meds,  Active smoking or vaping, A bunch of PE's (a small clot burden can't cause this syndrome unless there is already severe underlying pulm or vascular dz with poor reserve) plus two Bs  = Bronchiectasis and Beta blocker use..and one C= CHF  Adherence is always the initial "prime suspect" and is a multilayered concern that requires a "trust but verify" approach in every patient - starting with knowing how to use medications, especially inhalers, correctly, keeping up with refills and understanding the fundamental difference between maintenance and prns vs those medications only taken for a very short course and then stopped and not refilled.  - see respimat teaching  - return in 2 weeks with all meds in hand using a trust but verify approach to confirm accurate Medication  Reconciliation The principal here is that until we are certain that the  patients are doing what we've asked, it makes no sense to ask them to do more.   Active smoking also at top of list   ? Acid (or non-acid) GERD > always difficult to exclude as up to 75% of pts in some series report no assoc GI/ Heartburn symptoms> rec continue max (24h)  acid suppression and diet restrictions/ reviewed     ? Active sinus dz/ rhinitis/ bronchitis > zpak only for now  ? Allergy > depomedrol 120 mg IM   ? Anxiety/depression  > usually at the bottom of this list of usual suspects  and note already on psychotropics and may interfere with inability to quit smoking, adherence to meds, and  also interpretation of response or lack thereof to symptom management which can be quite subjective.

## 2021-08-04 NOTE — Progress Notes (Signed)
[name], female    DOB: 10/06/1955   MRN: [id number]   Brief patient profile:  64 yowf active smoker  referred to pulmonary clinic in [place]  05/04/2021 by Dr  Shearon Stalls   Previous patient of Dr. Luan Pulling for COPD. Has been on oxygen since 2018. years. 4-5LNC.  FEV1 51% of predicted 1.46L in 2018. She turns up oxygen when she gets short of breath.  Last Shaune Spittle 11/05/2020 Rec pulmonary rehab   History of Present Illness  05/04/2021  Pulmonary/ 1st office eval/ Daksh Coates / [facility] re GOLD 2 copd  Chief Complaint  Patient presents with   Follow-up    4-4.5L O2 cont. SOB and cough have worsened because of congestion. Coughing up green mucus.   On room air pt was at 89%-90% after walk form lobby to room. 94% on 4L O2 cont.after sitting in room.   Prev. Seen dr. Shearon Stalls but [name] is closer.   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  Cough: some am congestion Sleep: sometimes cough noct bed flat / 2 pillows  SABA use: multiple forms 02 4lpm / pulse 02 4  Has overt hb despite reporting ppi bid  Rec Plan A = Automatic = Always=    stiolto 2 puffs 1st thing each am  Work on inhaler technique:  Plan B = Backup (to supplement plan A, not to replace it) Only use your albuterol inhaler (proair)as a rescue medication Plan C = Crisis (instead of Plan B but only if Plan B stops working) - only use your albuterol nebulizer if you first try Plan B and it fails to help > ok to use the nebulizer up to every 4 hours but if start needing it regularly call for immediate appointment Stop lisinopril and call me today if you are not 30 mg daily of lisinopril  Benicar (olmesartan) 20 mg daily in place of lisinopril Prednisone 10 mg take  4 each am x 2 days,   2 each am x 2 days,  1 each am x 2 days and stop  The key is to stop smoking completely before smoking completely stops you! Make sure you check your oxygen saturation  at your highest level of  activity  to be sure it stays over 90% and adjust  02 flow upward to maintain this level if needed but remember to turn it back to previous settings when you stop (to conserve your supply).     08/04/2021  f/u ov/[name] re:  COPD 3/ 02 dep maint on stiolto  Chief Complaint  Patient presents with   Follow-up    Feels no improvement in breathing since last OV.   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[employer] but not checking 02 / does try walking some outside 100 ft MB Cough: smoker's rattle / yellowish esp in am Sleeping: bed is flat with 2 pillow  SABA use: once already hfa and neb 4 x daily plus combivent  02: 4 lpm /  POC 4  Covid status: vax x 3  Lung cancer screening: due f/u   No obvious day to day or daytime variability or assoc excess/ purulent sputum or mucus plugs or hemoptysis or cp or chest tightness, subjective wheeze or overt sinus or hb symptoms.   Sleeping  without nocturnal  or early am exacerbation  of respiratory  c/o's or need for noct saba. Also denies any obvious fluctuation of symptoms with weather or  environmental changes or other aggravating or alleviating factors except as outlined above   No unusual exposure hx or h/o childhood pna/ asthma or knowledge of premature birth.  Current Allergies, Complete Past Medical History, Past Surgical History, Family History, and Social History were reviewed in [facility] record.  ROS  The following are not active complaints unless bolded Hoarseness, sore throat, dysphagia, dental problems, itching, sneezing,  nasal congestion or discharge of excess mucus or purulent secretions, ear ache,   fever, chills, sweats, unintended wt loss or wt gain, classically pleuritic or exertional cp,  orthopnea pnd or arm/hand swelling  or leg swelling, presyncope, palpitations, abdominal pain, anorexia, nausea, vomiting, diarrhea  or change in bowel habits or change in bladder habits, change in stools or change  in urine, dysuria, hematuria,  rash, arthralgias, visual complaints, headache, numbness, weakness or ataxia or problems with walking or coordination,  change in mood or  memory.        Current Meds - - NOTE:   Unable to verify as accurately reflecting what pt takes    Medication Sig   Accu-Chek FastClix Lancets MISC USE TO CHECK BLOODOGLUCOSE ONCE DAILY   ACCU-CHEK GUIDE test strip USE TO CHECK BLOODEBLUCOSE ONCE DAILY.   acetaminophen (TYLENOL) 500 MG tablet Take 1 tablet (500 mg total) by mouth every 6 (six) hours as needed.   albuterol (PROVENTIL) (2.5 MG/3ML) 0.083% nebulizer solution INHALE 1 VIAL VIA NEBULIZER EVERY 4 HOURS   ARIPiprazole (ABILIFY) 10 MG tablet Take 10 mg by mouth daily.   Blood Pressure Monitor MISC 1 each by Does not apply route as directed. Dx: Hypertension   cetirizine (ZYRTEC) 10 MG tablet Take 10 mg by mouth daily as needed for allergies.    COMBIVENT RESPIMAT 20-100 MCG/ACT AERS respimat INHALE 1 PUFF INTO LUNGS EVERY 4 HOURS.   doxycycline (VIBRA-TABS) 100 MG tablet Take 1 tablet (100 mg total) by mouth 2 (two) times daily.   DULoxetine (CYMBALTA) 30 MG capsule Take 1 capsule by mouth daily.   fluticasone (FLONASE) 50 MCG/ACT nasal spray Place 1 spray into both nostrils 2 (two) times daily as needed for allergies or rhinitis.   gabapentin (NEURONTIN) 800 MG tablet Take 800 mg by mouth 3 (three) times daily.   guaiFENesin (MUCINEX) 600 MG 12 hr tablet Take 2 tablets (1,200 mg total) by mouth every 12 (twelve) hours.   hydrOXYzine (VISTARIL) 50 MG capsule TAKE 1 CAPSULE BY MOUTH THREE TIMES DAILY AS NEEDED FOR ANXIETY   ipratropium (ATROVENT) 0.02 % nebulizer solution INHALE (1) VIAL VIA NEBULIZER EVERY FOUR HOURS AS NEEDED.   metFORMIN (GLUCOPHAGE) 1000 MG tablet Take 1,000 mg by mouth 2 (two) times daily.   olmesartan (BENICAR) 20 MG tablet Take 1 tablet (20 mg total) by mouth daily.   olmesartan-hydrochlorothiazide (BENICAR HCT) 20-12.5 MG tablet Take 1 tablet by  mouth daily.   omeprazole (PRILOSEC) 40 MG capsule 1 capsule 30 minutes before morning meal   OXYGEN Inhale 4.5 L into the lungs continuous.    predniSONE (DELTASONE) 10 MG tablet Take 4 tabs x 2 days, 2 tabs x 2 days, then 1 tab x 2 days and stop.   PROAIR HFA 108 (90 Base) MCG/ACT inhaler Inhale 1-2 puffs into the lungs every 6 (six) hours as needed for wheezing or shortness of breath.   rOPINIRole (REQUIP) 1 MG tablet Take 1 mg by mouth 3 (three) times daily.    simvastatin (ZOCOR) 10 MG tablet Take 10 mg by mouth daily.  SUMAtriptan (IMITREX) 25 MG tablet Take 25 mg by mouth every 2 (two) hours as needed for migraine.   Tiotropium Bromide-Olodaterol (STIOLTO RESPIMAT) 2.5-2.5 MCG/ACT AERS Inhale 2 puffs into the lungs daily.   torsemide (DEMADEX) 20 MG tablet Take 1 tablet (20 mg total) by mouth daily as needed. May take an additional 20 mg daily as needed for extreme swelling.                    Past Medical History:  Diagnosis Date   Anemia    Anxiety    Arthritis    Bipolar 1 disorder (HCC)    CHF (congestive heart failure) (HCC)    COPD (chronic obstructive pulmonary disease) (HCC)    Depression    Diabetes mellitus without complication (HCC)    Dyspnea    Emphysema of lung (HCC)    GERD (gastroesophageal reflux disease)    Headache    migraines   History of hiatal hernia    History of kidney stones    Hyperlipidemia    Hypertension    Neuromuscular disorder (HCC)    neuropathy   On home oxygen therapy    Pneumonia 2015, 2019   Tracheomalacia    Vaginal Pap smear, abnormal         Objective:       Wt Readings from Last 3 Encounters:  08/04/21 230 lb 1.3 oz (104.4 kg)  05/04/21 226 lb 1.9 oz (102.6 kg)  04/17/21 223 lb 6.4 oz (101.3 kg)      Vital signs reviewed  08/04/2021  - Note at rest 02 sats  93% on 4lpm POC   General appearance:    amb obese hoarse wf still prominent PW     HEENT : pt wearing mask not removed for exam due to covid - 19  concerns.    NECK :  without JVD/Nodes/TM/ nl carotid upstrokes bilaterally   LUNGS: no acc muscle use,  Mild barrel  contour chest wall with bilateral  pan exp audible wheeze and  without cough on insp or exp maneuvers  and mild  Hyperresonant  to  percussion bilaterally     CV:  RRR  no s3 or murmur or increase in P2, and no edema   ABD:  soft and nontender with pos end  insp Hoover's  in the supine position. No bruits or organomegaly appreciated, bowel sounds nl  MS:   Nl gait/  ext warm without deformities, calf tenderness, cyanosis or clubbing No obvious joint restrictions   SKIN: warm and dry without lesions    NEURO:  alert, approp, nl sensorium with  no motor or cerebellar deficits apparent.            Assessment

## 2021-08-04 NOTE — Assessment & Plan Note (Signed)
Again advised:  Make sure you check your oxygen saturation  AT  your highest level of activity (not after you stop)   to be sure it stays over 90% and adjust  02 flow upward to maintain this level if needed but remember to turn it back to previous settings when you stop (to conserve your supply).

## 2021-08-04 NOTE — Patient Instructions (Addendum)
We will be referring you to Eric Form NP for the lung cancer screening   Depomedrol 120 mg IM   Zpak   Plan A = Automatic = Always=    Stiolto 2 pffs each   Work on inhaler technique:  relax and gently blow all the way out then take a nice smooth full deep breath back in, triggering the inhaler at same time you start breathing in.  Hold for up to 5 seconds if you can.       Plan B = Backup (to supplement plan A, not to replace it) Only use your albuterol inhaler as a rescue medication to be used if you can't catch your breath by resting or doing a relaxed purse lip breathing pattern.  - The less you use it, the better it will work when you need it. - Ok to use the inhaler up to 2 puffs  every 4 hours if you must but call for appointment if use goes up over your usual need - Don't leave home without it !!  (think of it like the spare tire for your car)   Plan C = Crisis (instead of Plan B but only if Plan B stops working) - only use your albuterol nebulizer if you first try Plan B and it fails to help > ok to use the nebulizer up to every 4 hours but if start needing it regularly call for immediate appointment  The key is to stop smoking completely before smoking completely stops you!      Please schedule a follow up office visit in 2 weeks, sooner if needed  with all medications /inhalers/ solutions in hand so we can verify exactly what you are taking. This includes all medications from all doctors and over the counters

## 2021-08-04 NOTE — Assessment & Plan Note (Signed)
Counseled re importance of smoking cessation but did not meet time criteria for separate billing    Advised only rx for RBILD is stop all smoking           Each maintenance medication was reviewed in detail including emphasizing most importantly the difference between maintenance and prns and under what circumstances the prns are to be triggered using an action plan format where appropriate.  Total time for H and P, chart review, counseling, reviewing hfa/neb/02 device(s) and generating customized AVS unique to this office visit / same day charting  > 30 min

## 2021-08-10 ENCOUNTER — Encounter: Payer: Self-pay | Admitting: Internal Medicine

## 2021-08-11 ENCOUNTER — Telehealth: Payer: Self-pay | Admitting: Internal Medicine

## 2021-08-11 ENCOUNTER — Other Ambulatory Visit: Payer: Self-pay | Admitting: Internal Medicine

## 2021-08-11 NOTE — Telephone Encounter (Signed)
Pt calling to make sure Dr. Melvyn Novas is ok w/pt having surgery on her back due to COPD and being on oxygen.  Dr. Marcello Moores or Dr. Brien Few will be doing surgery.    Dr. Melvyn Novas please advise

## 2021-08-11 NOTE — Telephone Encounter (Signed)
Cleared for back surgery

## 2021-08-11 NOTE — Telephone Encounter (Signed)
Called and notified patient. Nothing further needed.

## 2021-08-13 ENCOUNTER — Telehealth: Payer: Self-pay | Admitting: Internal Medicine

## 2021-08-13 NOTE — Telephone Encounter (Signed)
Called the pt and there was no answer- LMTCB    

## 2021-08-13 NOTE — Telephone Encounter (Signed)
Zpak  Medrol 4 mg  x 4 x 2 days,  x 2  x 2days, then x1 x 2 days

## 2021-08-13 NOTE — Telephone Encounter (Signed)
Primary Pulmonologist: Dr. Melvyn Novas Last office visit and with whom: 08/04/2021 Dr. Melvyn Novas  What do we see them for (pulmonary problems): Respiratory bronchiolitis associated interstitial lung disease, COPD  Last OV assessment/plan: see below   Was appointment offered to patient (explain)?  Pt has an appt on 08/20/2021    Reason for call: Patient calling stating she has chest tightness, congestion, wheezing, coughing up yellow mucus.  Covid test neg  Flu test neg  Wants to know if she can have an antibiotic and a steroid.   Allergies  Allergen Reactions   Penicillins Other (See Comments)    Patient is unsure if she allergic to penicillin or septra. Patient states one or another caused "rib pain with a little breathing problem". Has patient had a PCN reaction causing immediate rash, facial/tongue/throat swelling, SOB or lightheadedness with hypotension: YES Has patient had a PCN reaction causing severe rash involving mucus membranes or skin necrosis: NO Has patient had a PCN reaction that required hospitalization: NO Has patient had a PCN reaction occurring within the last 10 years: NO   Septra [Sulfamethoxazole-Trimethoprim] Other (See Comments)    Patient is unsure if she allergic to septra or penicillin. Patient states one or another caused "rib pain with a little breathing problem".    Immunization History  Administered Date(s) Administered   Influenza Whole 03/29/2019   Influenza-Unspecified 03/06/2019, 04/13/2021   Moderna Sars-Covid-2 Vaccination 02/22/2020, 04/18/2020   Pneumococcal Polysaccharide-23 07/06/2014   Tdap 11/30/2013     Assessment                                       Assessment & Plan Note by Tanda Rockers, MD at 08/04/2021 1:14 PM  Author: Tanda Rockers, MD Author Type: Physician Filed: 08/04/2021  1:18 PM  Note Status: Bernell List: Cosign Not Required Encounter Date: 08/04/2021  Problem: COPD GOLD 2/ still smoker   Editor: Tanda Rockers, MD  (Physician)      Prior Versions: 1. Tanda Rockers, MD (Physician) at 08/04/2021  1:16 PM - Written  Active smoker -  05/04/2021   try change to stiolto and off acei - 08/04/2021  After extensive coaching inhaler device,  effectiveness =  75% with stiolto from a baseline of 25%    DDX of  difficult airways management almost all start with A and  include Adherence, Ace Inhibitors, Acid Reflux, Active Sinus Disease, Alpha 1 Antitripsin deficiency, Anxiety masquerading as Airways dz,  ABPA,  Allergy(esp in young), Aspiration (esp in elderly), Adverse effects of meds,  Active smoking or vaping, A bunch of PE's (a small clot burden can't cause this syndrome unless there is already severe underlying pulm or vascular dz with poor reserve) plus two Bs  = Bronchiectasis and Beta blocker use..and one C= CHF   Adherence is always the initial "prime suspect" and is a multilayered concern that requires a "trust but verify" approach in every patient - starting with knowing how to use medications, especially inhalers, correctly, keeping up with refills and understanding the fundamental difference between maintenance and prns vs those medications only taken for a very short course and then stopped and not refilled.  - see respimat teaching  - return in 2 weeks with all meds in hand using a trust but verify approach to confirm accurate Medication  Reconciliation The principal here is that until we are certain that the  patients  are doing what we've asked, it makes no sense to ask them to do more.    Active smoking also at top of list    ? Acid (or non-acid) GERD > always difficult to exclude as up to 75% of pts in some series report no assoc GI/ Heartburn symptoms> rec continue max (24h)  acid suppression and diet restrictions/ reviewed      ? Active sinus dz/ rhinitis/ bronchitis > zpak only for now   ? Allergy > depomedrol 120 mg IM    ? Anxiety/depression  > usually at the bottom of this list of usual suspects   and note already on psychotropics and may interfere with inability to quit smoking, adherence to meds, and also interpretation of response or lack thereof to symptom management which can be quite subjective.         Assessment & Plan Note by Tanda Rockers, MD at 08/04/2021 1:16 PM  Author: Tanda Rockers, MD Author Type: Physician Filed: 08/04/2021  1:17 PM  Note Status: Written Cosign: Cosign Not Required Encounter Date: 08/04/2021  Problem: Cigarette smoker  Editor: Tanda Rockers, MD (Physician)             Counseled re importance of smoking cessation but did not meet time criteria for separate billing     Advised only rx for RBILD is stop all smoking              Each maintenance medication was reviewed in detail including emphasizing most importantly the difference between maintenance and prns and under what circumstances the prns are to be triggered using an action plan format where appropriate.   Total time for H and P, chart review, counseling, reviewing hfa/neb/02 device(s) and generating customized AVS unique to this office visit / same day charting  > 30 min              Assessment & Plan Note by Tanda Rockers, MD at 08/04/2021 1:16 PM  Author: Tanda Rockers, MD Author Type: Physician Filed: 08/04/2021  1:16 PM  Note Status: Written Cosign: Cosign Not Required Encounter Date: 08/04/2021  Problem: Chronic hypoxemic respiratory failure ([place])  Editor: Tanda Rockers, MD (Physician)             Again advised:   Make sure you check your oxygen saturation  AT  your highest level of activity (not after you stop)   to be sure it stays over 90% and adjust  02 flow upward to maintain this level if needed but remember to turn it back to previous settings when you stop (to conserve your supply).         Patient Instructions by Tanda Rockers, MD at 08/04/2021 11:00 AM  Author: Tanda Rockers, MD Author Type: Physician Filed: 08/04/2021 11:20 AM  Note Status: Addendum  Cosign: Cosign Not Required Encounter Date: 08/04/2021  Editor: Tanda Rockers, MD (Physician)      Prior Versions: 1. Tanda Rockers, MD (Physician) at 08/04/2021 11:15 AM - Addendum   2. Tanda Rockers, MD (Physician) at 08/04/2021 11:14 AM - Signed  We will be referring you to Eric Form NP for the lung cancer screening    Depomedrol 120 mg IM    Zpak    Plan A = Automatic = Always=    Stiolto 2 pffs each    Work on inhaler technique:  relax and gently blow all the way out then take a nice smooth full  deep breath back in, triggering the inhaler at same time you start breathing in.  Hold for up to 5 seconds if you can.        Plan B = Backup (to supplement plan A, not to replace it) Only use your albuterol inhaler as a rescue medication to be used if you can't catch your breath by resting or doing a relaxed purse lip breathing pattern.  - The less you use it, the better it will work when you need it. - Ok to use the inhaler up to 2 puffs  every 4 hours if you must but call for appointment if use goes up over your usual need - Don't leave home without it !!  (think of it like the spare tire for your car)    Plan C = Crisis (instead of Plan B but only if Plan B stops working) - only use your albuterol nebulizer if you first try Plan B and it fails to help > ok to use the nebulizer up to every 4 hours but if start needing it regularly call for immediate appointment   The key is to stop smoking completely before smoking completely stops you!      Please schedule a follow up office visit in 2 weeks, sooner if needed  with all medications /inhalers/ solutions in hand so we can verify exactly what you are taking. This includes all medications from all doctors and over the counters

## 2021-08-14 ENCOUNTER — Other Ambulatory Visit: Payer: Self-pay

## 2021-08-14 ENCOUNTER — Telehealth: Payer: Self-pay | Admitting: Internal Medicine

## 2021-08-14 MED ORDER — METHYLPREDNISOLONE 4 MG PO TABS: ORAL_TABLET | ORAL | 0 refills | Status: AC

## 2021-08-14 MED ORDER — AZITHROMYCIN 250 MG PO TABS: ORAL_TABLET | ORAL | 0 refills | Status: AC

## 2021-08-14 MED ORDER — AZITHROMYCIN 250 MG PO TABS: ORAL_TABLET | ORAL | 0 refills | Status: DC

## 2021-08-14 NOTE — Telephone Encounter (Signed)
Zpak corrected. Called and spoke to Amy From [place] and let her know Dr. Melvyn Novas was okay with change to 21 DosePak.   Nothing further needed.

## 2021-08-14 NOTE — Telephone Encounter (Signed)
Amy form [facility] Doesn't have medrol tabs in stock but has DosePak.  Asking about 21 instead of 14. She states the directions on the dosepak are 6 first day and decrease by one each day.  Dr. Melvyn Novas please advise if this is okay

## 2021-08-14 NOTE — Telephone Encounter (Signed)
Pt returning call from yesterday.  [phone]

## 2021-08-14 NOTE — Telephone Encounter (Signed)
Fine with me to change to the 21 pack

## 2021-08-14 NOTE — Telephone Encounter (Signed)
Called and left detailed message on home phone (ok per dpr) informing patient of meds recommended by Dr. Melvyn Novas, how to take the meds, and that they were sent to [facility]. Advised patient to call back for questions. Nothing further needed.

## 2021-08-20 ENCOUNTER — Ambulatory Visit: Payer: Medicare Other | Admitting: Internal Medicine

## 2021-08-28 ENCOUNTER — Telehealth: Payer: Self-pay | Admitting: Internal Medicine

## 2021-08-28 NOTE — Telephone Encounter (Signed)
Spoke with pt  Dr Marcello Moores needing risk assessment done for spine surgery  Can you addend the last note to include this?   She states her breathing is doing well

## 2021-08-31 NOTE — Telephone Encounter (Signed)
Spoke to patient and relayed below message/recommendations and voiced her understanding.  She is scheduled to see Dr. Melvyn Novas on 09/22/2021. Offered sooner appt in [place] and patient declined. She is aware to bring allo meds with her.   Nothing further needed.

## 2021-08-31 NOTE — Telephone Encounter (Signed)
Needs to be seen with all medications in hand to be cleared for surgery and ideally stop smoking x 2 weeks preop before clearance can be granted as she is at high risk of respiratory complications

## 2021-09-01 ENCOUNTER — Telehealth: Payer: Self-pay | Admitting: Internal Medicine

## 2021-09-01 MED ORDER — PREDNISONE 10 MG PO TABS: ORAL_TABLET | ORAL | 0 refills | Status: DC

## 2021-09-01 NOTE — Telephone Encounter (Signed)
Prednisone 10 mg take  4 each am x 2 days,   2 each am x 2 days,  1 each am x 2 days and stop  

## 2021-09-01 NOTE — Telephone Encounter (Signed)
Primary Pulmonologist: Dr. Melvyn Novas Last office visit and with whom: Dr. Melvyn Novas 08/04/2021  What do we see them for (pulmonary problems): bronchiolitis. COPD  Last OV assessment/plan: see below   Was appointment offered to patient (explain)?  No none available    Reason for call: Patient is congested. No fever. Cough with yellow mucus. Increased SOB.  Has been going on for a few days.Wheezing  Neg flu and covid test  Patient would like to know if she can have prednisone. Please advise   Allergies  Allergen Reactions   Penicillins Other (See Comments)    Patient is unsure if she allergic to penicillin or septra. Patient states one or another caused "rib pain with a little breathing problem". Has patient had a PCN reaction causing immediate rash, facial/tongue/throat swelling, SOB or lightheadedness with hypotension: YES Has patient had a PCN reaction causing severe rash involving mucus membranes or skin necrosis: NO Has patient had a PCN reaction that required hospitalization: NO Has patient had a PCN reaction occurring within the last 10 years: NO   Septra [Sulfamethoxazole-Trimethoprim] Other (See Comments)    Patient is unsure if she allergic to septra or penicillin. Patient states one or another caused "rib pain with a little breathing problem".    Immunization History  Administered Date(s) Administered   Influenza Whole 03/29/2019   Influenza-Unspecified 03/06/2019, 04/13/2021   Moderna Sars-Covid-2 Vaccination 02/22/2020, 04/18/2020   Pneumococcal Polysaccharide-23 07/06/2014   Tdap 11/30/2013     Assessment                                       Assessment & Plan Note by Tanda Rockers, MD at 08/04/2021 1:14 PM  Author: Tanda Rockers, MD Author Type: Physician Filed: 08/04/2021  1:18 PM  Note Status: Bernell List: Cosign Not Required Encounter Date: 08/04/2021  Problem: COPD GOLD 2/ still smoker   Editor: Tanda Rockers, MD (Physician)      Prior Versions: 1.  Tanda Rockers, MD (Physician) at 08/04/2021  1:16 PM - Written  Active smoker -  05/04/2021   try change to stiolto and off acei - 08/04/2021  After extensive coaching inhaler device,  effectiveness =  75% with stiolto from a baseline of 25%    DDX of  difficult airways management almost all start with A and  include Adherence, Ace Inhibitors, Acid Reflux, Active Sinus Disease, Alpha 1 Antitripsin deficiency, Anxiety masquerading as Airways dz,  ABPA,  Allergy(esp in young), Aspiration (esp in elderly), Adverse effects of meds,  Active smoking or vaping, A bunch of PE's (a small clot burden can't cause this syndrome unless there is already severe underlying pulm or vascular dz with poor reserve) plus two Bs  = Bronchiectasis and Beta blocker use..and one C= CHF   Adherence is always the initial "prime suspect" and is a multilayered concern that requires a "trust but verify" approach in every patient - starting with knowing how to use medications, especially inhalers, correctly, keeping up with refills and understanding the fundamental difference between maintenance and prns vs those medications only taken for a very short course and then stopped and not refilled.  - see respimat teaching  - return in 2 weeks with all meds in hand using a trust but verify approach to confirm accurate Medication  Reconciliation The principal here is that until we are certain that the  patients are doing what  we've asked, it makes no sense to ask them to do more.    Active smoking also at top of list    ? Acid (or non-acid) GERD > always difficult to exclude as up to 75% of pts in some series report no assoc GI/ Heartburn symptoms> rec continue max (24h)  acid suppression and diet restrictions/ reviewed      ? Active sinus dz/ rhinitis/ bronchitis > zpak only for now   ? Allergy > depomedrol 120 mg IM    ? Anxiety/depression  > usually at the bottom of this list of usual suspects  and note already on psychotropics  and may interfere with inability to quit smoking, adherence to meds, and also interpretation of response or lack thereof to symptom management which can be quite subjective.         Assessment & Plan Note by Tanda Rockers, MD at 08/04/2021 1:16 PM  Author: Tanda Rockers, MD Author Type: Physician Filed: 08/04/2021  1:17 PM  Note Status: Written Cosign: Cosign Not Required Encounter Date: 08/04/2021  Problem: Cigarette smoker  Editor: Tanda Rockers, MD (Physician)             Counseled re importance of smoking cessation but did not meet time criteria for separate billing     Advised only rx for RBILD is stop all smoking              Each maintenance medication was reviewed in detail including emphasizing most importantly the difference between maintenance and prns and under what circumstances the prns are to be triggered using an action plan format where appropriate.   Total time for H and P, chart review, counseling, reviewing hfa/neb/02 device(s) and generating customized AVS unique to this office visit / same day charting  > 30 min              Assessment & Plan Note by Tanda Rockers, MD at 08/04/2021 1:16 PM  Author: Tanda Rockers, MD Author Type: Physician Filed: 08/04/2021  1:16 PM  Note Status: Written Cosign: Cosign Not Required Encounter Date: 08/04/2021  Problem: Chronic hypoxemic respiratory failure ([place])  Editor: Tanda Rockers, MD (Physician)             Again advised:   Make sure you check your oxygen saturation  AT  your highest level of activity (not after you stop)   to be sure it stays over 90% and adjust  02 flow upward to maintain this level if needed but remember to turn it back to previous settings when you stop (to conserve your supply).         Patient Instructions by Tanda Rockers, MD at 08/04/2021 11:00 AM  Author: Tanda Rockers, MD Author Type: Physician Filed: 08/04/2021 11:20 AM  Note Status: Addendum Cosign: Cosign Not Required  Encounter Date: 08/04/2021  Editor: Tanda Rockers, MD (Physician)      Prior Versions: 1. Tanda Rockers, MD (Physician) at 08/04/2021 11:15 AM - Addendum   2. Tanda Rockers, MD (Physician) at 08/04/2021 11:14 AM - Signed  We will be referring you to Eric Form NP for the lung cancer screening    Depomedrol 120 mg IM    Zpak    Plan A = Automatic = Always=    Stiolto 2 pffs each    Work on inhaler technique:  relax and gently blow all the way out then take a nice smooth full deep breath back  in, triggering the inhaler at same time you start breathing in.  Hold for up to 5 seconds if you can.        Plan B = Backup (to supplement plan A, not to replace it) Only use your albuterol inhaler as a rescue medication to be used if you can't catch your breath by resting or doing a relaxed purse lip breathing pattern.  - The less you use it, the better it will work when you need it. - Ok to use the inhaler up to 2 puffs  every 4 hours if you must but call for appointment if use goes up over your usual need - Don't leave home without it !!  (think of it like the spare tire for your car)    Plan C = Crisis (instead of Plan B but only if Plan B stops working) - only use your albuterol nebulizer if you first try Plan B and it fails to help > ok to use the nebulizer up to every 4 hours but if start needing it regularly call for immediate appointment   The key is to stop smoking completely before smoking completely stops you!      Please schedule a follow up office visit in 2 weeks, sooner if needed  with all medications /inhalers/ solutions in hand so we can verify exactly what you are taking. This includes all medications from all doctors and over the counters

## 2021-09-01 NOTE — Telephone Encounter (Signed)
Patient is aware of recommendations and voiced her understanding.  Prednisone sent to preferred pharmacy.  Nothing further needed.

## 2021-09-08 ENCOUNTER — Telehealth: Payer: Self-pay | Admitting: Internal Medicine

## 2021-09-08 NOTE — Telephone Encounter (Signed)
Primary Pulmonologist: Dr. Melvyn Novas ?Last office visit and with whom: Dr. Melvyn Novas 08/04/2021          ?What do we see them for (pulmonary problems): bronchiolitis. COPD  ?Last OV assessment/plan: see below  ?  ?Was appointment offered to patient (explain)?  No none available in [facility] office.  ?  ?  ?Reason for call: Patient is still congested. No fever. Cough green and yellow mucus. Increased SOB  Completed prednisone and states it helped while she was taking it but now that she has completed she is not feeling well again. Patient would like to know if she can have an antibiotic.  ? ?Please advise  ? ?     ?Allergies  ?Allergen Reactions  ? Penicillins Other (See Comments)  ?    Patient is unsure if she allergic to penicillin or septra. Patient states one or another caused "rib pain with a little breathing problem". ?Has patient had a PCN reaction causing immediate rash, facial/tongue/throat swelling, SOB or lightheadedness with hypotension: YES ?Has patient had a PCN reaction causing severe rash involving mucus membranes or skin necrosis: NO ?Has patient had a PCN reaction that required hospitalization: NO ?Has patient had a PCN reaction occurring within the last 10 years: NO  ? Septra [Sulfamethoxazole-Trimethoprim] Other (See Comments)  ?    Patient is unsure if she allergic to septra or penicillin. Patient states one or another caused "rib pain with a little breathing problem".  ?  ?  ?    ?Immunization History  ?Administered Date(s) Administered  ? Influenza Whole 03/29/2019  ? Influenza-Unspecified 03/06/2019, 04/13/2021  ? Moderna Sars-Covid-2 Vaccination 02/22/2020, 04/18/2020  ? Pneumococcal Polysaccharide-23 07/06/2014  ? Tdap 11/30/2013  ?  ?  ?Assessment  ?  ?  ?  ?  ?  ?  ?  ?  ?     ?  ?  ?  ?  ?    ?  ?  ? Assessment & Plan Note by Tanda Rockers, MD at 08/04/2021 1:14 PM ?  ?        ?Author: Tanda Rockers, MD Author Type: Physician Filed: 08/04/2021  1:18 PM   ?Note Status: Bernell List: Cosign Not Required  Encounter Date: 08/04/2021   ?Problem: COPD GOLD 2/ still smoker    ?Editor: Tanda Rockers, MD (Physician)           ?Prior Versions: 1. Tanda Rockers, MD (Physician) at 08/04/2021  1:16 PM - Written   ?Active smoker ?-  05/04/2021   try change to stiolto and off acei ?- 08/04/2021  After extensive coaching inhaler device,  effectiveness =  75% with stiolto from a baseline of 25%  ?  ?DDX of  difficult airways management almost all start with A and  include Adherence, Ace Inhibitors, Acid Reflux, Active Sinus Disease, Alpha 1 Antitripsin deficiency, Anxiety masquerading as Airways dz,  ABPA,  Allergy(esp in young), Aspiration (esp in elderly), Adverse effects of meds,  Active smoking or vaping, A bunch of PE's (a small clot burden can't cause this syndrome unless there is already severe underlying pulm or vascular dz with poor reserve) plus two Bs  = Bronchiectasis and Beta blocker use..and one C= CHF ?  ?Adherence is always the initial "prime suspect" and is a multilayered concern that requires a "trust but verify" approach in every patient - starting with knowing how to use medications, especially inhalers, correctly, keeping up with refills and understanding the fundamental difference between maintenance  and prns vs those medications only taken for a very short course and then stopped and not refilled.  ?- see respimat teaching  ?- return in 2 weeks with all meds in hand using a trust but verify approach to confirm accurate Medication  Reconciliation The principal here is that until we are certain that the  patients are doing what we've asked, it makes no sense to ask them to do more.  ?  ?Active smoking also at top of list  ?  ?? Acid (or non-acid) GERD > always difficult to exclude as up to 75% of pts in some series report no assoc GI/ Heartburn symptoms> rec continue max (24h)  acid suppression and diet restrictions/ reviewed    ?  ?? Active sinus dz/ rhinitis/ bronchitis > zpak only for now ?  ?? Allergy >  depomedrol 120 mg IM  ?  ?? Anxiety/depression  > usually at the bottom of this list of usual suspects  and note already on psychotropics and may interfere with inability to quit smoking, adherence to meds, and also interpretation of response or lack thereof to symptom management which can be quite subjective.  ?   ?  ?  ?  ? Assessment & Plan Note by Tanda Rockers, MD at 08/04/2021 1:16 PM ?  ?        ?Author: Tanda Rockers, MD Author Type: Physician Filed: 08/04/2021  1:17 PM   ?Note Status: Written Cosign: Cosign Not Required Encounter Date: 08/04/2021   ?Problem: Cigarette smoker   ?Editor: Tanda Rockers, MD (Physician)           ?              ?Counseled re importance of smoking cessation but did not meet time criteria for separate billing   ?  ?Advised only rx for RBILD is stop all smoking  ?  ?  ?    ?  ?  ?Each maintenance medication was reviewed in detail including emphasizing most importantly the difference between maintenance and prns and under what circumstances the prns are to be triggered using an action plan format where appropriate. ?  ?Total time for H and P, chart review, counseling, reviewing hfa/neb/02 device(s) and generating customized AVS unique to this office visit / same day charting  > 30 min  ?     ?   ?  ?  ?  ? Assessment & Plan Note by Tanda Rockers, MD at 08/04/2021 1:16 PM ?  ?        ?Author: Tanda Rockers, MD Author Type: Physician Filed: 08/04/2021  1:16 PM   ?Note Status: Written Cosign: Cosign Not Required Encounter Date: 08/04/2021   ?Problem: Chronic hypoxemic respiratory failure ([facility])   ?Editor: Tanda Rockers, MD (Physician)           ?              ?Again advised: ?  ?Make sure you check your oxygen saturation  AT  your highest level of activity (not after you stop)   to be sure it stays over 90% and adjust  02 flow upward to maintain this level if needed but remember to turn it back to previous settings when you stop (to conserve your supply).  ?   ?  ?  ?  ? Patient  Instructions by Tanda Rockers, MD at 08/04/2021 11:00 AM ?  ?        ?Author: Melvyn Novas,  Christena Deem, MD Author Type: Physician Filed: 08/04/2021 11:20 AM   ?Note Status: Addendum Cosign: Cosign Not Required Encounter Date: 08/04/2021   ?Editor: Tanda Rockers, MD (Physician)           ?Prior Versions: 1. Tanda Rockers, MD (Physician) at 08/04/2021 11:15 AM - Addendum   ?  2. Tanda Rockers, MD (Physician) at 08/04/2021 11:14 AM - Signed   ?We will be referring you to Eric Form NP for the lung cancer screening  ?  ?Depomedrol 120 mg IM  ?  ?Zpak  ?  ?Plan A = Automatic = Always=    Stiolto 2 pffs each  ?  ?Work on inhaler technique:  relax and gently blow all the way out then take a nice smooth full deep breath back in, triggering the inhaler at same time you start breathing in.  Hold for up to 5 seconds if you can.   ?   ?  ?Plan B = Backup (to supplement plan A, not to replace it) ?Only use your albuterol inhaler as a rescue medication to be used if you can't catch your breath by resting or doing a relaxed purse lip breathing pattern.  ?- The less you use it, the better it will work when you need it. ?- Ok to use the inhaler up to 2 puffs  every 4 hours if you must but call for appointment if use goes up over your usual need ?- Don't leave home without it !!  (think of it like the spare tire for your car)  ?  ?Plan C = Crisis (instead of Plan B but only if Plan B stops working) ?- only use your albuterol nebulizer if you first try Plan B and it fails to help > ok to use the nebulizer up to every 4 hours but if start needing it regularly call for immediate appointment ?  ?The key is to stop smoking completely before smoking completely stops you! ?  ?   ?Please schedule a follow up office visit in 2 weeks, sooner if needed  with all medications /inhalers/ solutions in hand so we can verify exactly what you are taking. This includes all medications from all doctors and over the counters  ?  ?  ?   ?  ? ?

## 2021-09-08 NOTE — Telephone Encounter (Signed)
Key is to stop smoking completely ? ?Ov with all meds in hand one week [place] with all meds in hand using a trust but verify approach to confirm accurate Medication  Reconciliation   ?

## 2021-09-08 NOTE — Telephone Encounter (Signed)
Called and spoke with the pt and notified of response per Dr Melvyn Novas  ?Pt verbalized understanding  ?Appt with MW scheduled in [place] office 09/17/21 at 11:30 am  ?Pt notified to bring ALL MEDS ?

## 2021-09-10 ENCOUNTER — Telehealth: Payer: Self-pay | Admitting: Internal Medicine

## 2021-09-10 ENCOUNTER — Ambulatory Visit (INDEPENDENT_AMBULATORY_CARE_PROVIDER_SITE_OTHER): Payer: Medicaid Other | Admitting: Sports Medicine

## 2021-09-10 ENCOUNTER — Encounter: Payer: Self-pay | Admitting: Sports Medicine

## 2021-09-10 ENCOUNTER — Other Ambulatory Visit: Payer: Self-pay

## 2021-09-10 DIAGNOSIS — E119 Type 2 diabetes mellitus without complications: Secondary | ICD-10-CM

## 2021-09-10 DIAGNOSIS — B351 Tinea unguium: Secondary | ICD-10-CM | POA: Diagnosis not present

## 2021-09-10 DIAGNOSIS — M79674 Pain in right toe(s): Secondary | ICD-10-CM | POA: Diagnosis not present

## 2021-09-10 NOTE — Telephone Encounter (Signed)
ATC patient about her upcoming appointment for surgical clearance. Looks like she is scheduled for 2 different appointments about 5 days apart. She can cancel one of them and then keep the other so that she can get cleared. Patient is scheduled for 09/17/2021 and 09/22/2021 ? ?When patient calls back please cancel one of the appointments. ?

## 2021-09-10 NOTE — Progress Notes (Signed)
Subjective: [name] is a 66 y.o. female patient with history of diabetes who presents to office today complaining of long,mildly painful right second toenail.  Patient reports that when it grows thick and she cannot trim it it gets very sore.  Patient's blood sugar not recorded today A1c 6.5 and last PCP Jani Gravel, MD  visit was January 2023.  Patient is assisted by husband this visit.  Patient Active Problem List   Diagnosis Date Noted   Cigarette smoker 05/04/2021   Encounter for gynecological examination with Papanicolaou smear of cervix 12/31/2020   Encounter for screening fecal occult blood testing 12/31/2020   Hallucinations 11/03/2020   Major depressive disorder, single episode, unspecified 11/03/2020   Other long term (current) drug therapy 11/03/2020   Severe recurrent major depression without psychotic features ([place]) 11/03/2020   Airway malacia 09/02/2020   Choking episode 09/02/2020   Chronic hypoxemic respiratory failure ([place]) 09/02/2020   Hip pain 05/27/2020   Degeneration of lumbar intervertebral disc 07/13/2019   Lumbar degenerative disc disease 07/13/2019   Lumbar radiculopathy 07/13/2019   Lumbar spondylosis 07/13/2019   Ganglion cyst of dorsum of left wrist s/p removal 09/07/18    DM (diabetes mellitus), type 2 ([place]) 08/30/2017   Respiratory bronchiolitis associated interstitial lung disease ([place]) 08/30/2017   Hyperglycemia 08/26/2017   Chronic pain 08/25/2017   COPD with acute exacerbation ([place]) 08/25/2017   Alcohol abuse 08/01/2015   Anorgasmia of female 01/01/2015   Low grade squamous intraepithelial lesion (LGSIL) on Papanicolaou smear of cervix 12/04/2014   Abdominal pain, chronic, epigastric 11/20/2014   Dysphagia, pharyngoesophageal phase    Hx of colonic polyps    COPD exacerbation ([place]) 07/05/2014   GERD without esophagitis 07/05/2014   Bipolar 1 disorder ([place]) 07/05/2014   Anxiety 07/05/2014   Polysubstance abuse ([place]) 07/05/2014    Obesity (BMI 30-39.9) 07/05/2014   Encephalopathy, metabolic 63/07/6008   Personal history of colonic polyps 04/01/2014   Essential hypertension 03/06/2014   Mild dysplasia of cervix 11/13/2013   Acute respiratory failure with hypoxia ([place]) 07/08/2013   Benzodiazepine dependence ([place]) 08/26/2012   COPD GOLD 2/ still smoker  08/25/2012   Panic disorder 08/25/2012   Tobacco use disorder 08/25/2012   Hyperlipidemia 08/25/2012   Current Outpatient Medications on File Prior to Visit  Medication Sig Dispense Refill   SUMAtriptan (IMITREX) 50 MG tablet 1 tablet at least 2 hours between doses as needed for migraine headache     Accu-Chek FastClix Lancets MISC USE TO CHECK BLOODOGLUCOSE ONCE DAILY     ACCU-CHEK GUIDE test strip USE TO CHECK BLOODEBLUCOSE ONCE DAILY.     acetaminophen (TYLENOL) 500 MG tablet Take 1 tablet (500 mg total) by mouth every 6 (six) hours as needed. 30 tablet 0   albuterol (PROVENTIL) (2.5 MG/3ML) 0.083% nebulizer solution INHALE 1 VIAL VIA NEBULIZER EVERY 4 HOURS 360 mL 2   ARIPiprazole (ABILIFY) 10 MG tablet Take 10 mg by mouth daily.     Blood Pressure Monitor MISC 1 each by Does not apply route as directed. Dx: Hypertension 1 each 0   Budeson-Glycopyrrol-Formoterol (BREZTRI AEROSPHERE) 160-9-4.8 MCG/ACT AERO 2 puffs     cetirizine (ZYRTEC) 10 MG tablet Take 10 mg by mouth daily as needed for allergies.      diclofenac Sodium (VOLTAREN) 1 % GEL SMARTSIG:Gram(s) Topical 3 Times Daily PRN     DULoxetine (CYMBALTA) 30 MG capsule Take 1 capsule by mouth daily.     esomeprazole (NEXIUM) 20 MG capsule Take 20 mg  by mouth daily.     FLUoxetine (PROZAC) 20 MG capsule 1 capsule     fluticasone (FLONASE ALLERGY RELIEF) 50 MCG/ACT nasal spray 1 spray in each nostril     fluticasone (FLONASE) 50 MCG/ACT nasal spray Place 1 spray into both nostrils 2 (two) times daily as needed for allergies or rhinitis. 16 g 6   gabapentin (NEURONTIN) 800 MG tablet Take 800 mg by mouth 3 (three)  times daily.     guaiFENesin (MUCINEX) 600 MG 12 hr tablet Take 2 tablets (1,200 mg total) by mouth every 12 (twelve) hours. 20 tablet 0   hydrOXYzine (VISTARIL) 50 MG capsule TAKE 1 CAPSULE BY MOUTH THREE TIMES DAILY AS NEEDED FOR ANXIETY 90 capsule 0   Ipratropium-Albuterol (COMBIVENT RESPIMAT) 20-100 MCG/ACT AERS respimat 1 puff as needed     meloxicam (MOBIC) 15 MG tablet Take 15 mg by mouth daily.     metFORMIN (GLUCOPHAGE) 1000 MG tablet Take 1,000 mg by mouth 2 (two) times daily.     methylPREDNISolone (MEDROL DOSEPAK) 4 MG TBPK tablet Take by mouth as directed.     nicotine (NICODERM CQ - DOSED IN MG/24 HOURS) 21 mg/24hr patch 1 patch to skin     nitroGLYCERIN (NITROSTAT) 0.4 MG SL tablet Place under the tongue.     olmesartan (BENICAR) 20 MG tablet Take 1 tablet (20 mg total) by mouth daily. 30 tablet 11   olmesartan-hydrochlorothiazide (BENICAR HCT) 20-12.5 MG tablet Take 1 tablet by mouth daily. 30 tablet 11   omeprazole (PRILOSEC) 20 MG capsule Take 20 mg by mouth daily.     omeprazole (PRILOSEC) 40 MG capsule 1 capsule 30 minutes before morning meal     oxybutynin (DITROPAN-XL) 5 MG 24 hr tablet Take 5 mg by mouth daily.     OXYGEN Inhale 4.5 L into the lungs continuous.      predniSONE (DELTASONE) 10 MG tablet 4tabx2d,2tabx2d,1tabx2d 14 tablet 0   PROAIR HFA 108 (90 Base) MCG/ACT inhaler Inhale 1-2 puffs into the lungs every 6 (six) hours as needed for wheezing or shortness of breath. 8 g 12   RESTASIS 0.05 % ophthalmic emulsion 1 drop 2 (two) times daily.     rOPINIRole (REQUIP) 1 MG tablet Take 1 mg by mouth 3 (three) times daily.      simvastatin (ZOCOR) 10 MG tablet Take 10 mg by mouth daily.     simvastatin (ZOCOR) 20 MG tablet Take 20 mg by mouth daily.     SUMAtriptan (IMITREX) 25 MG tablet Take 25 mg by mouth every 2 (two) hours as needed for migraine.     Tiotropium Bromide-Olodaterol (STIOLTO RESPIMAT) 2.5-2.5 MCG/ACT AERS Inhale 2 puffs into the lungs daily. 4 g 2    Tiotropium Bromide-Olodaterol (STIOLTO RESPIMAT) 2.5-2.5 MCG/ACT AERS Inhale 2 puffs into the lungs daily. 4 g 0   torsemide (DEMADEX) 20 MG tablet Take 1 tablet (20 mg total) by mouth daily as needed. May take an additional 20 mg daily as needed for extreme swelling. 90 tablet 1   No current facility-administered medications on file prior to visit.   Allergies  Allergen Reactions   Penicillins Other (See Comments)    Patient is unsure if she allergic to penicillin or septra. Patient states one or another caused "rib pain with a little breathing problem". Has patient had a PCN reaction causing immediate rash, facial/tongue/throat swelling, SOB or lightheadedness with hypotension: YES Has patient had a PCN reaction causing severe rash involving mucus membranes or skin necrosis: NO  Has patient had a PCN reaction that required hospitalization: NO Has patient had a PCN reaction occurring within the last 10 years: NO   Septra [Sulfamethoxazole-Trimethoprim] Other (See Comments)    Patient is unsure if she allergic to septra or penicillin. Patient states one or another caused "rib pain with a little breathing problem".    No results found for this or any previous visit (from the past 2160 hour(s)).   Objective: General: Patient is awake, alert, and oriented x 3 and in no acute distress.  Integument: Skin is warm, dry and supple bilateral. Nails are tender, long, thickened and  dystrophic with subungual debris, consistent with onychomycosis, 1-5 bilateral with the right second toe most involved as previously noted.  Surgical incision on the right foot well-healed scar almost nonexistent. No open lesions or preulcerative lesions present bilateral. Remaining integument unremarkable.  Vasculature:  Dorsalis Pedis pulse 1/4 bilateral. Posterior Tibial pulse 1/4 bilateral.  Capillary fill time <3 sec 1-5 bilateral. Positive hair growth to the level of the digits. Temperature gradient within normal  limits. No varicosities present bilateral. No edema present bilateral.   Neurology: The patient has intact sensation measured with a 5.07/10g Semmes Weinstein Monofilament at all pedal sites bilateral . Vibratory sensation diminished bilateral with tuning fork, unchanged from prior.  Musculoskeletal: Pain to right second toe and thick toenail.  Long second toe noted bilateral.  Range of motion within normal limits at right first MPJ at area of previous implant surgery unchanged from prior.  Assessment and Plan: Problem List Items Addressed This Visit   None Visit Diagnoses     Pain due to onychomycosis of toenail of right foot    -  Primary   Diabetes mellitus without complication (HCC)       Relevant Medications   simvastatin (ZOCOR) 20 MG tablet       -Examined patient. -Discussed and educated patient on diabetic foot care, especially with  regards to the vascular, neurological and musculoskeletal systems.  -Mechanically debrided all nails 1-5 bilateral especially the right second toe using sterile nail nipper and filed with dremel without incident  -Continue with good supportive shoes daily for foot type that does not rub toe -Answered all patient questions -Patient to return  in 3 months for at risk foot care with Dr. Elisha Ponder -Patient advised to call the office if any problems or questions arise in the meantime.  Landis Martins, DPM

## 2021-09-14 NOTE — Telephone Encounter (Signed)
Called and spoke with patient to let her know that she is scheduled for 2 different appointments and only needs one. She decided to keep the appt on 3/16 and cancel the appt on 3/21. Advised patient to keep her appt for 3/16 because its also for her surgical clearance. Patient expressed understanding. Nothing further needed  ?

## 2021-09-17 ENCOUNTER — Ambulatory Visit (INDEPENDENT_AMBULATORY_CARE_PROVIDER_SITE_OTHER): Payer: Medicare Other | Admitting: Internal Medicine

## 2021-09-17 ENCOUNTER — Other Ambulatory Visit: Payer: Self-pay

## 2021-09-17 ENCOUNTER — Encounter: Payer: Self-pay | Admitting: Internal Medicine

## 2021-09-17 DIAGNOSIS — F17210 Nicotine dependence, cigarettes, uncomplicated: Secondary | ICD-10-CM

## 2021-09-17 DIAGNOSIS — J449 Chronic obstructive pulmonary disease, unspecified: Secondary | ICD-10-CM | POA: Diagnosis not present

## 2021-09-17 DIAGNOSIS — J9611 Chronic respiratory failure with hypoxia: Secondary | ICD-10-CM | POA: Diagnosis not present

## 2021-09-17 MED ORDER — BREZTRI AEROSPHERE 160-9-4.8 MCG/ACT IN AERO: 2.0000 | INHALATION_SPRAY | Freq: Two times a day (BID) | RESPIRATORY_TRACT | Status: DC

## 2021-09-17 MED ORDER — PREDNISONE 10 MG PO TABS: ORAL_TABLET | ORAL | 0 refills | Status: DC

## 2021-09-17 NOTE — Assessment & Plan Note (Addendum)
Active smoker ?-  05/04/2021   try change to stiolto and off acei ?- 08/04/2021  After extensive coaching inhaler device,  effectiveness =  75% with stiolto from a baseline of 25%  ?- 09/17/2021  After extensive coaching inhaler device,  effectiveness =    50% with hfa > continue breztri / approp saba and Prednisone 10 mg take  4 each am x 2 days,   2 each am x 2 days,  1 each am x 2 days and stop and regroup in 6 weeks with all meds in hand  ? ? Group D in terms of symptom/risk and laba/lama/ICS  therefore appropriate rx at this point >>>  breztri and approp saba ? ?Re SABA :  I spent extra time with pt today reviewing appropriate use of albuterol for prn use on exertion with the following points: ?1) saba is for relief of sob that does not improve by walking a slower pace or resting but rather if the pt does not improve after trying this first. ?2) If the pt is convinced, as many are, that saba helps recover from activity faster then it's easy to tell if this is the case by re-challenging : ie stop, take the inhaler, then p 5 minutes try the exact same activity (intensity of workload) that just caused the symptoms and see if they are substantially diminished or not after saba ?3) if there is an activity that reproducibly causes the symptoms, try the saba 15 min before the activity on alternate days  ? ?If in fact the saba really does help, then fine to continue to use it prn but advised may need to look closer at the maintenance regimen being used to achieve better control of airways disease with exertion.  ? ?Addendum: ?Ok for surgery  ? ?  ?

## 2021-09-17 NOTE — Patient Instructions (Signed)
Plan A = Automatic = Always=    Breztri Take 2 puffs first thing in am and then another 2 puffs about 12 hours later.  ?  ?Work on inhaler technique:  relax and gently blow all the way out then take a nice smooth full deep breath back in, triggering the inhaler at same time you start breathing in.  Hold for up to 5 seconds if you can. Blow out thru nose. Rinse and gargle with water when done.  If mouth or throat bother you at all,  try brushing teeth/gums/tongue with arm and hammer toothpaste/ make a slurry and gargle and spit out.  ? ?  - remember how golfers take practice swings  ? ?Plan B = Backup (to supplement plan A, not to replace it) ?Only use your albuterol inhaler as a rescue medication to be used if you can't catch your breath by resting or doing a relaxed purse lip breathing pattern.  ?- The less you use it, the better it will work when you need it. ?- Ok to use the inhaler up to 2 puffs  every 4 hours if you must but call for appointment if use goes up over your usual need ?- Don't leave home without it !!  (think of it like the spare tire for your car)  ? ?Plan C = Crisis (instead of Plan B but only if Plan B stops working) ?- only use your albuterol nebulizer if you first try Plan B and it fails to help > ok to use the nebulizer up to every 4 hours but if start needing it regularly call for immediate appointment ? ? ?Prednisone 10 mg take  4 each am x 2 days,   2 each am x 2 days,  1 each am x 2 days and stop  ? ?Make sure you check your oxygen saturation  AT  your highest level of activity (not after you stop)   to be sure it stays over 90% and adjust  02 flow upward to maintain this level if needed but remember to turn it back to previous settings when you stop (to conserve your supply).  ? ?We will call you about lung cancer screening ? ?The key is to stop smoking completely before smoking completely stops you! ? ? ?Please schedule a follow up office visit in 6 weeks, call sooner if needed with all  medications /inhalers/ solutions in hand so we can verify exactly what you are taking. This includes all medications from all doctors and over the counters  ? ?  ?   ?

## 2021-09-17 NOTE — Assessment & Plan Note (Signed)
Referred for [facility]  09/17/2021  ? ?Low-dose CT lung cancer screening is recommended for patients who ?are 65-67 years of age with a 20+ pack-year history of smoking and ?who are currently smoking or quit <=15 years ago. ?No coughing up blood  ?No unintentional weight loss of > 15 pounds in the last 6 months  ?She is eligible until age 51 >>> referred for shared decision making  ?

## 2021-09-17 NOTE — Assessment & Plan Note (Addendum)
Advised ? ?Stable on present rx/ ok for surgery ? ?Make sure you check your oxygen saturation  AT  your highest level of activity (not after you stop)   to be sure it stays over 90% and adjust  02 flow upward to maintain this level if needed but remember to turn it back to previous settings when you stop (to conserve your supply).  ? ?    ?  ? ?Each maintenance medication was reviewed in detail including emphasizing most importantly the difference between maintenance and prns and under what circumstances the prns are to be triggered using an action plan format where appropriate. ? ?Total time for H and P, chart review, counseling, reviewing hfa/02 device(s) and generating customized AVS unique to this office visit / same day charting > 30 min  ?     ?

## 2021-09-17 NOTE — Progress Notes (Addendum)
? ?[name], female    DOB: Aug 01, 1955   MRN: [id number] ? ? ?Brief patient profile:  ?84 yowf active smoker  referred to pulmonary clinic in [place]  05/04/2021 by Dr  Shearon Stalls  ? ?Previous patient of Dr. Luan Pulling for COPD. Has been on oxygen since 2018. years. 4-5LNC.  ?FEV1 51% of predicted 1.46L in 2018. She turns up oxygen when she gets short of breath.  ?Last Shaune Spittle 11/05/2020 Rec pulmonary rehab ? ? ?History of Present Illness  ?05/04/2021  Pulmonary/ 1st office eval/ Melvyn Novas / [name] re GOLD 2 copd  ?Chief Complaint  ?Patient presents with  ? Follow-up  ?  4-4.5L O2 cont. SOB and cough have worsened because of congestion. Coughing up green mucus.  ? ?On room air pt was at 89%-90% after walk form lobby to room. 94% on 4L O2 cont.after sitting in room.  ? ?Prev. Seen dr. Shearon Stalls but [name] office is closer.   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  ?Cough: some am congestion ?Sleep: sometimes cough noct bed flat / 2 pillows  ?SABA use: multiple forms ?02 4lpm / pulse 02 4  ?Has overt hb despite reporting ppi bid  ?Rec ?Plan A = Automatic = Always=    stiolto 2 puffs 1st thing each am  ?Work on inhaler technique:  ?Plan B = Backup (to supplement plan A, not to replace it) ?Only use your albuterol inhaler (proair)as a rescue medication ?Plan C = Crisis (instead of Plan B but only if Plan B stops working) ?- only use your albuterol nebulizer if you first try Plan B and it fails to help > ok to use the nebulizer up to every 4 hours but if start needing it regularly call for immediate appointment ?Stop lisinopril and call me today if you are not 30 mg daily of lisinopril  ?Benicar (olmesartan) 20 mg daily in place of lisinopril ?Prednisone 10 mg take  4 each am x 2 days,   2 each am x 2 days,  1 each am x 2 days and stop  ?The key is to stop smoking completely before smoking completely stops you! ?Make sure you check your oxygen saturation  at your highest level of  activity  to be sure it stays over 90% and adjust  02 flow upward to maintain this level if needed but remember to turn it back to previous settings when you stop (to conserve your supply).  ? ? ? ?08/04/2021  f/u ov/[place] office/[name] re:  COPD 3/ 02 dep maint on stiolto  ?Chief Complaint  ?Patient presents with  ? Follow-up  ?  Feels no improvement in breathing since last OV.   ?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[employer] but not checking 02 / does try walking some outside 100 ft MB ?Cough: smoker's rattle / yellowish esp in am ?Sleeping: bed is flat with 2 pillow  ?SABA use: once already hfa and neb 4 x daily plus combivent  ?02: 4 lpm /  POC 4  ?Covid status: vax x 3  ?Lung cancer screening: due f/u ?Rec ?We will be referring you to Eric Form NP for the lung cancer screening  ?Depomedrol 120 mg IM  ?Zpak  ?Plan A = Automatic = Always=    Stiolto 2 pffs each  ?Work on inhaler technique:  relax and gently blow all the way out then take a nice smooth full deep breath back in, triggering the inhaler at  same time you start reathing in.  Hold for up to 5 seconds if you can.   ?  Plan B = Backup (to supplement plan A, not to replace it) ?Only use your albuterol inhaler as a rescue medication  ?Plan C = Crisis (instead of Plan B but only if Plan B stops working) ?- only use your albuterol nebulizer if you first try Plan B and it fails to help ?The key is to stop smoking completely before smoking completely stops you! ?Please schedule a follow up office visit in 2 weeks, sooner if needed  with all medications /inhalers/ solutions in hand ? ? ? ?09/17/2021  f/u ov/[name] re: copd 3/02 dep    maint on ??? confused with meds did not bring them as rec  ?Chief Complaint  ?Patient presents with  ? Follow-up  ?  Pt still has wheezing and congestion  ?  ? Dyspnea:  little better with exertion  ?Cough: smoker's rattle  ?Sleeping: flat bed 2 pillows no resp cc ?SABA use: 3-4 inhaler, not needing  ?02: 4lpm hs  and up to 5lpm  daytime but not titrating by sats  ?  ? ? ?No obvious day to day or daytime variability or assoc excess/ purulent sputum or mucus plugs or hemoptysis or cp or chest tightness,  or overt sinus or hb symptoms.  ? ?Sleeping as above  without nocturnal  or early am exacerbation  of respiratory  c/o's or need for noct saba. Also denies any obvious fluctuation of symptoms with weather or environmental changes or other aggravating or alleviating factors except as outlined above  ? ?No unusual exposure hx or h/o childhood pna/ asthma or knowledge of premature birth. ? ?Current Allergies, Complete Past Medical History, Past Surgical History, Family History, and Social History were reviewed in [facility] record. ? ?ROS  The following are not active complaints unless bolded ?Hoarseness, sore throat, dysphagia, dental problems, itching, sneezing,  nasal congestion or discharge of excess mucus or purulent secretions, ear ache,   fever, chills, sweats, unintended wt loss or wt gain, classically pleuritic or exertional cp,  orthopnea pnd or arm/hand swelling  or leg swelling, presyncope, palpitations, abdominal pain, anorexia, nausea, vomiting, diarrhea  or change in bowel habits or change in bladder habits, change in stools or change in urine, dysuria, hematuria,  rash, arthralgias, visual complaints, headache, numbness, weakness or ataxia or problems with walking or coordination,  change in mood or  memory. ?      ? ?Current Meds - - NOTE:   Unable to verify as accurately reflecting what pt takes    ?Medication Sig  ? Accu-Chek FastClix Lancets MISC USE TO CHECK BLOODOGLUCOSE ONCE DAILY  ? ACCU-CHEK GUIDE test strip USE TO CHECK BLOODEBLUCOSE ONCE DAILY.  ? acetaminophen (TYLENOL) 500 MG tablet Take 1 tablet (500 mg total) by mouth every 6 (six) hours as needed.  ? albuterol (PROVENTIL) (2.5 MG/3ML) 0.083% nebulizer solution INHALE 1 VIAL VIA NEBULIZER EVERY 4 HOURS  ? ARIPiprazole (ABILIFY) 10 MG tablet  Take 10 mg by mouth daily.  ? Blood Pressure Monitor MISC 1 each by Does not apply route as directed. Dx: Hypertension  ? Budeson-Glycopyrrol-Formoterol (BREZTRI AEROSPHERE) 160-9-4.8 MCG/ACT AERO 2 puffs  ? cetirizine (ZYRTEC) 10 MG tablet Take 10 mg by mouth daily as needed for allergies.   ? diclofenac Sodium (VOLTAREN) 1 % GEL SMARTSIG:Gram(s) Topical 3 Times Daily PRN  ? DULoxetine (CYMBALTA) 30 MG capsule Take 1 capsule by mouth  daily.  ? esomeprazole (NEXIUM) 20 MG capsule Take 20 mg by mouth daily.  ? FLUoxetine (PROZAC) 20 MG capsule 1 capsule  ? fluticasone (FLONASE) 50 MCG/ACT nasal spray Place 1 spray into both nostrils 2 (two) times daily as needed for allergies or rhinitis.  ? fluticasone (FLONASE) 50 MCG/ACT nasal spray 1 spray in each nostril  ? gabapentin (NEURONTIN) 800 MG tablet Take 800 mg by mouth 3 (three) times daily.  ? guaiFENesin (MUCINEX) 600 MG 12 hr tablet Take 2 tablets (1,200 mg total) by mouth every 12 (twelve) hours.  ? hydrOXYzine (VISTARIL) 50 MG capsule TAKE 1 CAPSULE BY MOUTH THREE TIMES DAILY AS NEEDED FOR ANXIETY  ? Ipratropium-Albuterol (COMBIVENT RESPIMAT) 20-100 MCG/ACT AERS respimat 1 puff as needed  ? meloxicam (MOBIC) 15 MG tablet Take 15 mg by mouth daily.  ? metFORMIN (GLUCOPHAGE) 1000 MG tablet Take 1,000 mg by mouth 2 (two) times daily.  ? methylPREDNISolone (MEDROL DOSEPAK) 4 MG TBPK tablet Take by mouth as directed.  ? nicotine (NICODERM CQ - DOSED IN MG/24 HOURS) 21 mg/24hr patch 1 patch to skin  ? nitroGLYCERIN (NITROSTAT) 0.4 MG SL tablet Place under the tongue.  ? olmesartan (BENICAR) 20 MG tablet Take 1 tablet (20 mg total) by mouth daily.  ? olmesartan-hydrochlorothiazide (BENICAR HCT) 20-12.5 MG tablet Take 1 tablet by mouth daily.  ? omeprazole (PRILOSEC) 20 MG capsule Take 20 mg by mouth daily.  ? omeprazole (PRILOSEC) 40 MG capsule 1 capsule 30 minutes before morning meal  ? oxybutynin (DITROPAN-XL) 5 MG 24 hr tablet Take 5 mg by mouth daily.  ? OXYGEN  Inhale 4.5 L into the lungs continuous.   ? predniSONE (DELTASONE) 10 MG tablet 4tabx2d,2tabx2d,1tabx2d  ? PROAIR HFA 108 (90 Base) MCG/ACT inhaler Inhale 1-2 puffs into the lungs every 6 (six) hours as neede

## 2021-09-18 ENCOUNTER — Other Ambulatory Visit: Payer: Self-pay | Admitting: *Deleted

## 2021-09-18 DIAGNOSIS — F17210 Nicotine dependence, cigarettes, uncomplicated: Secondary | ICD-10-CM

## 2021-09-18 DIAGNOSIS — Z87891 Personal history of nicotine dependence: Secondary | ICD-10-CM

## 2021-09-21 NOTE — Telephone Encounter (Signed)
OV notes and clearance form have been faxed back to [facility]. Nothing further needed at this time. ?

## 2021-09-22 ENCOUNTER — Ambulatory Visit: Payer: Medicare Other | Admitting: Internal Medicine

## 2021-09-25 NOTE — Telephone Encounter (Signed)
Error

## 2021-10-02 ENCOUNTER — Other Ambulatory Visit: Payer: Self-pay | Admitting: Internal Medicine

## 2021-10-06 ENCOUNTER — Telehealth: Payer: Self-pay | Admitting: Internal Medicine

## 2021-10-06 NOTE — Telephone Encounter (Signed)
Prednisone 10 mg take  4 each am x 2 days,   2 each am x 2 days,  1 each am x 2 days and stop  ? ?Doxy 100 mg bid x 10 days  ?

## 2021-10-06 NOTE — Telephone Encounter (Signed)
Called and spoke with patient who states that about 2 days ago she has had a lot of congestion, productive cough with yellow sputum and short of breath. Requesting antibiotic and/or prednisone. Patient was seen on 09/17/2021 and given a prednisone taper which she states she has finished.  ? ?Dr. Melvyn Novas please advise ?

## 2021-10-07 MED ORDER — DOXYCYCLINE HYCLATE 100 MG PO TABS
100.0000 mg | ORAL_TABLET | Freq: Two times a day (BID) | ORAL | 0 refills | Status: AC
Start: 2021-10-07 — End: 2021-10-17

## 2021-10-07 MED ORDER — PREDNISONE 10 MG PO TABS: ORAL_TABLET | ORAL | 0 refills | Status: AC

## 2021-10-07 NOTE — Telephone Encounter (Signed)
Spoke with patient to let her know that 2 prescriptions are being sent into preferred pharmacy. She expressed understanding. Nothing further needed at this time. ?

## 2021-10-28 ENCOUNTER — Ambulatory Visit (INDEPENDENT_AMBULATORY_CARE_PROVIDER_SITE_OTHER): Payer: Medicare Other | Admitting: Acute Care

## 2021-10-28 ENCOUNTER — Encounter: Payer: Self-pay | Admitting: Acute Care

## 2021-10-28 DIAGNOSIS — F17210 Nicotine dependence, cigarettes, uncomplicated: Secondary | ICD-10-CM | POA: Diagnosis not present

## 2021-10-28 NOTE — Patient Instructions (Signed)
Thank you for participating in the [facility]. It was our pleasure to meet you today. We will call you with the results of your scan within the next few days. Your scan will be assigned a Lung RADS category score by the physicians reading the scans.  This Lung RADS score determines follow up scanning.  See below for description of categories, and follow up screening recommendations. We will be in touch to schedule your follow up screening annually or based on recommendations of our providers. We will fax a copy of your scan results to your Primary Care Physician, or the physician who referred you to the program, to ensure they have the results. Please call the office if you have any questions or concerns regarding your scanning experience or results.  Our office number is [phone]. Please speak with Denise Phelps, RN. , or  Denise Buckner RN, They are  our Lung Cancer Screening RN.'s If They are unavailable when you call, Please leave a message on the voice mail. We will return your call at our earliest convenience.This voice mail is monitored several times a day.  Remember, if your scan is normal, we will scan you annually as long as you continue to meet the criteria for the program. (Age 55-77, Current smoker or smoker who has quit within the last 15 years). If you are a smoker, remember, quitting is the single most powerful action that you can take to decrease your risk of lung cancer and other pulmonary, breathing related problems. We know quitting is hard, and we are here to help.  Please let us know if there is anything we can do to help you meet your goal of quitting. If you are a former smoker, congratulations. We are proud of you! Remain smoke free! Remember you can refer friends or family members through the number above.  We will screen them to make sure they meet criteria for the program. Thank you for helping us take better care of you by  participating in Lung Screening.  You can receive free nicotine replacement therapy ( patches, gum or mints) by calling 1-800-QUIT NOW. Please call so we can get you on the path to becoming  a non-smoker. I know it is hard, but you can do this!  Lung RADS Categories:  Lung RADS 1: no nodules or definitely non-concerning nodules.  Recommendation is for a repeat annual scan in 12 months.  Lung RADS 2:  nodules that are non-concerning in appearance and behavior with a very low likelihood of becoming an active cancer. Recommendation is for a repeat annual scan in 12 months.  Lung RADS 3: nodules that are probably non-concerning , includes nodules with a low likelihood of becoming an active cancer.  Recommendation is for a 6-month repeat screening scan. Often noted after an upper respiratory illness. We will be in touch to make sure you have no questions, and to schedule your 6-month scan.  Lung RADS 4 A: nodules with concerning findings, recommendation is most often for a follow up scan in 3 months or additional testing based on our provider's assessment of the scan. We will be in touch to make sure you have no questions and to schedule the recommended 3 month follow up scan.  Lung RADS 4 B:  indicates findings that are concerning. We will be in touch with you to schedule additional diagnostic testing based on our provider's  assessment of the scan.  Other options for assistance in smoking cessation (   As covered by your insurance benefits)  Hypnosis for smoking cessation  [facility] [phone]  Acupuncture for smoking cessation  [facility] [phone]   

## 2021-10-28 NOTE — Progress Notes (Signed)
Virtual Visit via Telephone Note ? ?I connected with Essynce Munsch Maurer on 10/28/21 at 12:00 PM EDT by telephone and verified that I am speaking with the correct person using two identifiers. ? ?Location: ?Patient:  At home ?Provider:  [place], [place], [place], [address]  ?  ?I discussed the limitations, risks, security and privacy concerns of performing an evaluation and management service by telephone and the availability of in person appointments. I also discussed with the patient that there may be a patient responsible charge related to this service. The patient expressed understanding and agreed to proceed. ? ? ? ?Shared Decision Making Visit Lung Cancer Screening Program ?([phone]) ? ? ?Eligibility: ?Age 66 y.o. ?Pack Years Smoking History Calculation 52 pack year smoking history ?(# packs/per year x # years smoked) ?Recent History of coughing up blood  no ?Unexplained weight loss? no ?( >Than 15 pounds within the last 6 months ) ?Prior History Lung / other cancer no ?(Diagnosis within the last 5 years already requiring surveillance chest CT Scans). ?Smoking Status Current Smoker ?Former Smokers: Years since quit:  NA ? Quit Date:  NA ? ?Visit Components: ?Discussion included one or more decision making aids. yes ?Discussion included risk/benefits of screening. yes ?Discussion included potential follow up diagnostic testing for abnormal scans. yes ?Discussion included meaning and risk of over diagnosis. yes ?Discussion included meaning and risk of False Positives. yes ?Discussion included meaning of total radiation exposure. yes ? ?Counseling Included: ?Importance of adherence to annual lung cancer LDCT screening. yes ?Impact of comorbidities on ability to participate in the program. yes ?Ability and willingness to under diagnostic treatment. yes ? ?Smoking Cessation Counseling: ?Current Smokers:  ?Discussed importance of smoking cessation. yes ?Information about tobacco cessation classes and  interventions provided to patient. yes ?Patient provided with "ticket" for LDCT Scan. yes ?Symptomatic Patient. no ? Counseling NA ?Diagnosis Code: Tobacco Use Z72.0 ?Asymptomatic Patient yes ? Counseling (Intermediate counseling: > three minutes counseling) Z3299 ?Former Smokers:  ?Discussed the importance of maintaining cigarette abstinence. yes ?Diagnosis Code: Personal History of Nicotine Dependence. M42.683 ?Information about tobacco cessation classes and interventions provided to patient. Yes ?Patient provided with "ticket" for LDCT Scan. yes ?Written Order for Lung Cancer Screening with LDCT placed in Epic. Yes ?(CT Chest Lung Cancer Screening Low Dose W/O CM) [id number] ?Z12.2-Screening of respiratory organs ?Z87.891-Personal history of nicotine dependence ? ?I have spent 25 minutes of face to face/ virtual visit   time with  Ms. Sanderson discussing the risks and benefits of lung cancer screening. We viewed / discussed a power point together that explained in detail the above noted topics. We paused at intervals to allow for questions to be asked and answered to ensure understanding.We discussed that the single most powerful action that she can take to decrease her risk of developing lung cancer is to quit smoking. We discussed whether or not she is ready to commit to setting a quit date. We discussed options for tools to aid in quitting smoking including nicotine replacement therapy, non-nicotine medications, support groups, Quit Smart classes, and behavior modification. We discussed that often times setting smaller, more achievable goals, such as eliminating 1 cigarette a day for a week and then 2 cigarettes a day for a week can be helpful in slowly decreasing the number of cigarettes smoked. This allows for a sense of accomplishment as well as providing a clinical benefit. I provided  her  with smoking cessation  information  with contact information for community resources,  classes, free nicotine  replacement therapy, and access to mobile apps, text messaging, and on-line smoking cessation help. I have also provided  her  the office contact information in the event she needs to contact me, or the screening staff. We discussed the time and location of the scan, and that either Doroteo Glassman RN, Joella Prince, RN  or I will call / send a letter with the results within 24-72 hours of receiving them. The patient verbalized understanding of all of  the above and had no further questions upon leaving the office. They have my contact information in the event they have any further questions. ? ?I spent 3 minutes counseling on smoking cessation and the health risks of continued tobacco abuse. ? ?I explained to the patient that there has been a high incidence of coronary artery disease noted on these exams. I explained that this is a non-gated exam therefore degree or severity cannot be determined. This patient is on statin therapy. I have asked the patient to follow-up with their PCP regarding any incidental finding of coronary artery disease and management with diet or medication as their PCP  feels is clinically indicated. The patient verbalized understanding of the above and had no further questions upon completion of the visit. ? ? Pt. Is sick. We will cancel scan scheduled for tomorrow 4/27, and re-schedule in 3 weeks to give her time to get better. The Shared decision making visit was completed today.  ? ? ?Magdalen Spatz, NP ?10/28/2021 ? ? ? ? ? ? ?

## 2021-10-29 ENCOUNTER — Ambulatory Visit (HOSPITAL_COMMUNITY): Payer: Medicare Other

## 2021-11-04 ENCOUNTER — Other Ambulatory Visit: Payer: Self-pay | Admitting: Internal Medicine

## 2021-11-06 ENCOUNTER — Telehealth: Payer: Self-pay | Admitting: Internal Medicine

## 2021-11-06 MED ORDER — AZITHROMYCIN 250 MG PO TABS: ORAL_TABLET | ORAL | 0 refills | Status: AC

## 2021-11-06 NOTE — Telephone Encounter (Signed)
Zpak, stop  all smoking and smoke exp ?

## 2021-11-06 NOTE — Telephone Encounter (Signed)
Called patient she has congestion in chest and shortness of breath for approx. 2-3 days. She states she is coughing up white and yellow mucus sometimes but overall feels she has a lot of mucus in her chest that she can't cough up. No fever. She states she has a little wheezing.    ?Patient has an appointment 12/21/21.  ? ?Patient wants to know if something can be called in for her to help.  ? ?Dr. Melvyn Novas please advise.  ?

## 2021-11-06 NOTE — Telephone Encounter (Signed)
Called and spoke to patient.  ?Let her know what Dr. Morrison Old recommendations were and she voiced understanding.  ?Medication sent to [facility]  ?Nothing further needed.  ?

## 2021-11-09 ENCOUNTER — Ambulatory Visit: Payer: Medicare Other | Admitting: Internal Medicine

## 2021-11-16 NOTE — Telephone Encounter (Signed)
Error

## 2021-11-26 DIAGNOSIS — E559 Vitamin D deficiency, unspecified: Secondary | ICD-10-CM | POA: Insufficient documentation

## 2021-12-03 DIAGNOSIS — J398 Other specified diseases of upper respiratory tract: Secondary | ICD-10-CM | POA: Insufficient documentation

## 2021-12-08 ENCOUNTER — Ambulatory Visit (HOSPITAL_COMMUNITY): Admission: RE | Admit: 2021-12-08 | Payer: Medicare Other | Source: Ambulatory Visit

## 2021-12-10 ENCOUNTER — Telehealth: Payer: Self-pay | Admitting: Internal Medicine

## 2021-12-10 MED ORDER — PREDNISONE 10 MG PO TABS: ORAL_TABLET | ORAL | 0 refills | Status: DC

## 2021-12-10 NOTE — Telephone Encounter (Signed)
Primary Pulmonologist: Dr. Melvyn Novas  Last office visit and with whom: 09/17/2021 Dr. Melvyn Novas What do we see them for (pulmonary problems): COPD  Last OV assessment/plan: see below   Was appointment offered to patient (explain)?  Pt refused [place] has upcoming appt 6/19 in [facility] office    Reason for call:  Got better after last regimen but for the past two days patient has not been feeling well.  Pt is sick with congestion and wheezing. Coughing up yellow white sputum  No fever  Has not had to increase O2 lately.  Has tried mucinex and tylenol.   Would like an antibiotic  Dr. Melvyn Novas please advise    Allergies  Allergen Reactions   Penicillins Other (See Comments)    Patient is unsure if she allergic to penicillin or septra. Patient states one or another caused "rib pain with a little breathing problem". Has patient had a PCN reaction causing immediate rash, facial/tongue/throat swelling, SOB or lightheadedness with hypotension: YES Has patient had a PCN reaction causing severe rash involving mucus membranes or skin necrosis: NO Has patient had a PCN reaction that required hospitalization: NO Has patient had a PCN reaction occurring within the last 10 years: NO   Septra [Sulfamethoxazole-Trimethoprim] Other (See Comments)    Patient is unsure if she allergic to septra or penicillin. Patient states one or another caused "rib pain with a little breathing problem".    Immunization History  Administered Date(s) Administered   Influenza Whole 03/29/2019   Influenza-Unspecified 03/06/2019, 04/13/2021   Moderna Sars-Covid-2 Vaccination 02/22/2020, 04/18/2020   Pneumococcal Polysaccharide-23 07/06/2014   Tdap 11/30/2013     Assessment                                       Assessment & Plan Note by Tanda Rockers, MD at 09/17/2021 2:47 PM  Author: Tanda Rockers, MD Author Type: Physician Filed: 09/21/2021  2:35 PM  Note Status: Bernell List: Cosign Not Required Encounter Date:  09/17/2021  Problem: Chronic hypoxemic respiratory failure [facility])  Editor: Tanda Rockers, MD (Physician)      Prior Versions: 1. Tanda Rockers, MD (Physician) at 09/17/2021  2:51 PM - Edited   2. Tanda Rockers, MD (Physician) at 09/17/2021  2:47 PM - Written  Advised   Stable on present rx/ ok for surgery   Make sure you check your oxygen saturation  AT  your highest level of activity (not after you stop)   to be sure it stays over 90% and adjust  02 flow upward to maintain this level if needed but remember to turn it back to previous settings when you stop (to conserve your supply).            Each maintenance medication was reviewed in detail including emphasizing most importantly the difference between maintenance and prns and under what circumstances the prns are to be triggered using an action plan format where appropriate.   Total time for H and P, chart review, counseling, reviewing hfa/02 device(s) and generating customized AVS unique to this office visit / same day charting > 30 min              Assessment & Plan Note by Tanda Rockers, MD at 09/17/2021 2:46 PM  Author: Tanda Rockers, MD Author Type: Physician Filed: 09/21/2021  2:35 PM  Note Status: Bernell List: Bradley Not Required Encounter  Date: 09/17/2021  Problem: COPD GOLD 2/ still smoker   Editor: Tanda Rockers, MD (Physician)      Prior Versions: 1. Tanda Rockers, MD (Physician) at 09/17/2021  2:46 PM - Written  Active smoker -  05/04/2021   try change to stiolto and off acei - 08/04/2021  After extensive coaching inhaler device,  effectiveness =  75% with stiolto from a baseline of 25%  - 09/17/2021  After extensive coaching inhaler device,  effectiveness =    50% with hfa > continue breztri / approp saba and Prednisone 10 mg take  4 each am x 2 days,   2 each am x 2 days,  1 each am x 2 days and stop and regroup in 6 weeks with all meds in hand     Group D in terms of symptom/risk and laba/lama/ICS  therefore  appropriate rx at this point >>>  breztri and approp saba   Re SABA :  I spent extra time with pt today reviewing appropriate use of albuterol for prn use on exertion with the following points: 1) saba is for relief of sob that does not improve by walking a slower pace or resting but rather if the pt does not improve after trying this first. 2) If the pt is convinced, as many are, that saba helps recover from activity faster then it's easy to tell if this is the case by re-challenging : ie stop, take the inhaler, then p 5 minutes try the exact same activity (intensity of workload) that just caused the symptoms and see if they are substantially diminished or not after saba 3) if there is an activity that reproducibly causes the symptoms, try the saba 15 min before the activity on alternate days    If in fact the saba really does help, then fine to continue to use it prn but advised may need to look closer at the maintenance regimen being used to achieve better control of airways disease with exertion.    AddendumMadaline Brilliant for surgery             Assessment & Plan Note by Tanda Rockers, MD at 09/17/2021 2:46 PM  Author: Tanda Rockers, MD Author Type: Physician Filed: 09/17/2021  2:47 PM  Note Status: Written Cosign: Cosign Not Required Encounter Date: 09/17/2021  Problem: Cigarette smoker  Editor: Tanda Rockers, MD (Physician)             Referred for [facility]  09/17/2021    Low-dose CT lung cancer screening is recommended for patients who are 4-60 years of age with a 20+ pack-year history of smoking and who are currently smoking or quit <=15 years ago. No coughing up blood  No unintentional weight loss of > 15 pounds in the last 6 months  She is eligible until age 33 >>> referred for shared decision making         Patient Instructions by Tanda Rockers, MD at 09/17/2021 11:30 AM  Author: Tanda Rockers, MD Author Type: Physician Filed: 09/17/2021 12:37 PM  Note Status: Signed Cosign:  Cosign Not Required Encounter Date: 09/17/2021  Editor: Tanda Rockers, MD (Physician)             Plan A = Automatic = Always=    Breztri Take 2 puffs first thing in am and then another 2 puffs about 12 hours later.    Work on inhaler technique:  relax and gently blow all the way out then  take a nice smooth full deep breath back in, triggering the inhaler at same time you start breathing in.  Hold for up to 5 seconds if you can. Blow out thru nose. Rinse and gargle with water when done.  If mouth or throat bother you at all,  try brushing teeth/gums/tongue with arm and hammer toothpaste/ make a slurry and gargle and spit out.      - remember how golfers take practice swings    Plan B = Backup (to supplement plan A, not to replace it) Only use your albuterol inhaler as a rescue medication to be used if you can't catch your breath by resting or doing a relaxed purse lip breathing pattern.  - The less you use it, the better it will work when you need it. - Ok to use the inhaler up to 2 puffs  every 4 hours if you must but call for appointment if use goes up over your usual need - Don't leave home without it !!  (think of it like the spare tire for your car)    Plan C = Crisis (instead of Plan B but only if Plan B stops working) - only use your albuterol nebulizer if you first try Plan B and it fails to help > ok to use the nebulizer up to every 4 hours but if start needing it regularly call for immediate appointment     Prednisone 10 mg take  4 each am x 2 days,   2 each am x 2 days,  1 each am x 2 days and stop    Make sure you check your oxygen saturation  AT  your highest level of activity (not after you stop)   to be sure it stays over 90% and adjust  02 flow upward to maintain this level if needed but remember to turn it back to previous settings when you stop (to conserve your supply).    We will call you about lung cancer screening   The key is to stop smoking completely before smoking  completely stops you!     Please schedule a follow up office visit in 6 weeks, call sooner if needed with all medications /inhalers/ solutions in hand so we can verify exactly what you are taking. This includes all medications from all doctors and over the counters

## 2021-12-10 NOTE — Telephone Encounter (Signed)
Ok for  prednisone 10 mg x 2 until better then 1 daily until seen if office with all meds in hand   Stop smoking  Give # 30  and ok to add on to end of day on next [place] week or set up with NP in [place]

## 2021-12-10 NOTE — Telephone Encounter (Signed)
Called and spoke to patient and went over recs with her from Dr. Melvyn Novas. She voiced understanding of how to take prednisone.  Confirmed her appt for her on 6/19 in [facility] office and told her to make sure she keeps appt and brings all of her medicines. Patient was agreeable and very thankful for the prednisone to help until then. Nothing further needed at this time.

## 2021-12-14 ENCOUNTER — Ambulatory Visit: Payer: Medicare Other | Admitting: Podiatry

## 2021-12-15 ENCOUNTER — Telehealth: Payer: Self-pay | Admitting: Internal Medicine

## 2021-12-15 NOTE — Telephone Encounter (Signed)
She has called in the last 2 months x 2 for sick visit  We will be glad to see her today for a video visit if she cannot come them.  Otherwise she can use Delsym 2 teaspoons twice daily for cough , As needed    Can have Tessalon Perles 200 mg 1 tablet 3 times daily as needed for cough.  #30 2 refills  Please contact office for sooner follow up if symptoms do not improve or worsen or seek emergency care  '

## 2021-12-15 NOTE — Telephone Encounter (Signed)
Primary Pulmonologist: Dr. Melvyn Novas  Last office visit and with whom: 09/17/2021 Wert  What do we see them for (pulmonary problems): COPD  Last OV assessment/plan: see below   Was appointment offered to patient (explain)?  Patient has an appt 01/22/2022 with Dr. Melvyn Novas pt had to reschedule due to transportation    Reason for call: patient had a sick call on 6/8 for  cough with yellow and white sputum. Dr. Melvyn Novas recommended prednisone. Patient is still taking prednisone but would like to know if she have cough medicine sent in to [facility] to get her through until appt with Dr. Melvyn Novas. She states she is coughing so much that she is having a hard time sleeping.   Dr. Melvyn Novas is out of office so sending to [facility]OD.  Rexene Edison NP please advise. Thank you!    Allergies  Allergen Reactions   Penicillins Other (See Comments)    Patient is unsure if she allergic to penicillin or septra. Patient states one or another caused "rib pain with a little breathing problem". Has patient had a PCN reaction causing immediate rash, facial/tongue/throat swelling, SOB or lightheadedness with hypotension: YES Has patient had a PCN reaction causing severe rash involving mucus membranes or skin necrosis: NO Has patient had a PCN reaction that required hospitalization: NO Has patient had a PCN reaction occurring within the last 10 years: NO   Septra [Sulfamethoxazole-Trimethoprim] Other (See Comments)    Patient is unsure if she allergic to septra or penicillin. Patient states one or another caused "rib pain with a little breathing problem".    Immunization History  Administered Date(s) Administered   Influenza Whole 03/29/2019   Influenza-Unspecified 03/06/2019, 04/13/2021   Moderna Sars-Covid-2 Vaccination 02/22/2020, 04/18/2020   Pneumococcal Polysaccharide-23 07/06/2014   Tdap 11/30/2013    Assessment                                       Assessment & Plan Note by Tanda Rockers, MD at  09/17/2021 2:47 PM  Author: Tanda Rockers, MD Author Type: Physician Filed: 09/21/2021  2:35 PM  Note Status: Bernell List: Cosign Not Required Encounter Date: 09/17/2021  Problem: Chronic hypoxemic respiratory failure [facility])  Editor: Tanda Rockers, MD (Physician)      Prior Versions: 1. Tanda Rockers, MD (Physician) at 09/17/2021  2:51 PM - Edited   2. Tanda Rockers, MD (Physician) at 09/17/2021  2:47 PM - Written  Advised   Stable on present rx/ ok for surgery   Make sure you check your oxygen saturation  AT  your highest level of activity (not after you stop)   to be sure it stays over 90% and adjust  02 flow upward to maintain this level if needed but remember to turn it back to previous settings when you stop (to conserve your supply).            Each maintenance medication was reviewed in detail including emphasizing most importantly the difference between maintenance and prns and under what circumstances the prns are to be triggered using an action plan format where appropriate.   Total time for H and P, chart review, counseling, reviewing hfa/02 device(s) and generating customized AVS unique to this office visit / same day charting > 30 min              Assessment & Plan Note by Tanda Rockers,  MD at 09/17/2021 2:46 PM  Author: Tanda Rockers, MD Author Type: Physician Filed: 09/21/2021  2:35 PM  Note Status: Bernell List: Cosign Not Required Encounter Date: 09/17/2021  Problem: COPD GOLD 2/ still smoker   Editor: Tanda Rockers, MD (Physician)      Prior Versions: 1. Tanda Rockers, MD (Physician) at 09/17/2021  2:46 PM - Written  Active smoker -  05/04/2021   try change to stiolto and off acei - 08/04/2021  After extensive coaching inhaler device,  effectiveness =  75% with stiolto from a baseline of 25%  - 09/17/2021  After extensive coaching inhaler device,  effectiveness =    50% with hfa > continue breztri / approp saba and Prednisone 10 mg take  4 each am x 2 days,    2 each am x 2 days,  1 each am x 2 days and stop and regroup in 6 weeks with all meds in hand     Group D in terms of symptom/risk and laba/lama/ICS  therefore appropriate rx at this point >>>  breztri and approp saba   Re SABA :  I spent extra time with pt today reviewing appropriate use of albuterol for prn use on exertion with the following points: 1) saba is for relief of sob that does not improve by walking a slower pace or resting but rather if the pt does not improve after trying this first. 2) If the pt is convinced, as many are, that saba helps recover from activity faster then it's easy to tell if this is the case by re-challenging : ie stop, take the inhaler, then p 5 minutes try the exact same activity (intensity of workload) that just caused the symptoms and see if they are substantially diminished or not after saba 3) if there is an activity that reproducibly causes the symptoms, try the saba 15 min before the activity on alternate days    If in fact the saba really does help, then fine to continue to use it prn but advised may need to look closer at the maintenance regimen being used to achieve better control of airways disease with exertion.    AddendumMadaline Brilliant for surgery             Assessment & Plan Note by Tanda Rockers, MD at 09/17/2021 2:46 PM  Author: Tanda Rockers, MD Author Type: Physician Filed: 09/17/2021  2:47 PM  Note Status: Written Cosign: Cosign Not Required Encounter Date: 09/17/2021  Problem: Cigarette smoker  Editor: Tanda Rockers, MD (Physician)             Referred for [facility]  09/17/2021    Low-dose CT lung cancer screening is recommended for patients who are 62-74 years of age with a 20+ pack-year history of smoking and who are currently smoking or quit <=15 years ago. No coughing up blood  No unintentional weight loss of > 15 pounds in the last 6 months  She is eligible until age 35 >>> referred for shared decision making         Patient  Instructions by Tanda Rockers, MD at 09/17/2021 11:30 AM  Author: Tanda Rockers, MD Author Type: Physician Filed: 09/17/2021 12:37 PM  Note Status: Signed Cosign: Cosign Not Required Encounter Date: 09/17/2021  Editor: Tanda Rockers, MD (Physician)             Plan A = Automatic = Always=    Breztri Take 2 puffs first thing  in am and then another 2 puffs about 12 hours later.    Work on inhaler technique:  relax and gently blow all the way out then take a nice smooth full deep breath back in, triggering the inhaler at same time you start breathing in.  Hold for up to 5 seconds if you can. Blow out thru nose. Rinse and gargle with water when done.  If mouth or throat bother you at all,  try brushing teeth/gums/tongue with arm and hammer toothpaste/ make a slurry and gargle and spit out.      - remember how golfers take practice swings    Plan B = Backup (to supplement plan A, not to replace it) Only use your albuterol inhaler as a rescue medication to be used if you can't catch your breath by resting or doing a relaxed purse lip breathing pattern.  - The less you use it, the better it will work when you need it. - Ok to use the inhaler up to 2 puffs  every 4 hours if you must but call for appointment if use goes up over your usual need - Don't leave home without it !!  (think of it like the spare tire for your car)    Plan C = Crisis (instead of Plan B but only if Plan B stops working) - only use your albuterol nebulizer if you first try Plan B and it fails to help > ok to use the nebulizer up to every 4 hours but if start needing it regularly call for immediate appointment     Prednisone 10 mg take  4 each am x 2 days,   2 each am x 2 days,  1 each am x 2 days and stop    Make sure you check your oxygen saturation  AT  your highest level of activity (not after you stop)   to be sure it stays over 90% and adjust  02 flow upward to maintain this level if needed but remember to turn it back  to previous settings when you stop (to conserve your supply).    We will call you about lung cancer screening   The key is to stop smoking completely before smoking completely stops you!     Please schedule a follow up office visit in 6 weeks, call sooner if needed with all medications /inhalers/ solutions in hand so we can verify exactly what you are taking. This includes all medications from all doctors and over the counters

## 2021-12-15 NOTE — Telephone Encounter (Signed)
Called and spoke with pt and have scheduled her a video visit with TP 6/14. Nothing further needed.

## 2021-12-16 ENCOUNTER — Telehealth (INDEPENDENT_AMBULATORY_CARE_PROVIDER_SITE_OTHER): Payer: Medicare Other | Admitting: Adult Health

## 2021-12-16 ENCOUNTER — Encounter: Payer: Self-pay | Admitting: Adult Health

## 2021-12-16 ENCOUNTER — Other Ambulatory Visit: Payer: Self-pay | Admitting: *Deleted

## 2021-12-16 DIAGNOSIS — J9611 Chronic respiratory failure with hypoxia: Secondary | ICD-10-CM | POA: Diagnosis not present

## 2021-12-16 DIAGNOSIS — F17210 Nicotine dependence, cigarettes, uncomplicated: Secondary | ICD-10-CM | POA: Diagnosis not present

## 2021-12-16 DIAGNOSIS — J441 Chronic obstructive pulmonary disease with (acute) exacerbation: Secondary | ICD-10-CM

## 2021-12-16 DIAGNOSIS — R0602 Shortness of breath: Secondary | ICD-10-CM

## 2021-12-16 MED ORDER — DOXYCYCLINE HYCLATE 100 MG PO TABS: 100.0000 mg | ORAL_TABLET | Freq: Two times a day (BID) | ORAL | 0 refills | Status: DC

## 2021-12-16 MED ORDER — PREDNISONE 20 MG PO TABS: 20.0000 mg | ORAL_TABLET | Freq: Every day | ORAL | 0 refills | Status: DC

## 2021-12-16 MED ORDER — BENZONATATE 200 MG PO CAPS: 200.0000 mg | ORAL_CAPSULE | Freq: Three times a day (TID) | ORAL | 1 refills | Status: DC | PRN

## 2021-12-16 NOTE — Progress Notes (Signed)
Virtual Visit via Video Note  I connected with [name] on 12/16/21 at 12:00 PM EDT by a video enabled telemedicine application and verified that I am speaking with the correct person using two identifiers.  Location: Patient: Home  Provider: Office    I discussed the limitations of evaluation and management by telemedicine and the availability of in person appointments. The patient expressed understanding and agreed to proceed.  History of Present Illness: 66 year old female active smoker followed for severe COPD and oxygen dependent respiratory failure with baseline oxygen at 4 to 5 L  Today's video visit is an acute office visit.  Patient complains over the last week she has had increased cough congestion and wheezing.  Patient is prone to recurrent COPD flares.  Patient is called in each month for the last 3 months and received antibiotics and steroids on several occasions.  Says gets better for a little while then cough and congestion comes back. Last week patient was called and prednisone by Dr. Melvyn Novas to begin prednisone 10 mg daily.  Patient continues to smoke 1/2 PPD.   We discussed smoking cessation.  She is trying to quit.   She remains on Breztri inhaler twice daily.  Uses her albuterol nebulizer usually about twice daily. She denies any hemoptysis, chest pain, orthopnea, fever. She remains on oxygen 4 to 5 L.  This is her baseline.  Denies any increased oxygen demands.  O2 saturations today are 94% on 4 L of oxygen.  Currently on Prednisone 20mg  daily.  Does not drive. Has to set up transportation to get to office visits.  Appetite is okay no nausea vomiting diarrhea.    Has recently had a lung cancer screening visit.  She has an upcoming CT chest.    Past Medical History:  Diagnosis Date   Anemia    Anxiety    Arthritis    Bipolar 1 disorder (HCC)    CHF (congestive heart failure) (HCC)    COPD (chronic obstructive pulmonary disease) (HCC)    Depression     Diabetes mellitus without complication (HCC)    Dyspnea    Emphysema of lung (HCC)    GERD (gastroesophageal reflux disease)    Headache    migraines   History of hiatal hernia    History of kidney stones    Hyperlipidemia    Hypertension    Neuromuscular disorder (HCC)    neuropathy   On home oxygen therapy    Pneumonia 2015, 2019   Tracheomalacia    Vaginal Pap smear, abnormal    Current Outpatient Medications on File Prior to Visit  Medication Sig Dispense Refill   Accu-Chek FastClix Lancets MISC USE TO CHECK BLOODOGLUCOSE ONCE DAILY     ACCU-CHEK GUIDE test strip USE TO CHECK [place].     acetaminophen (TYLENOL) 500 MG tablet Take 1 tablet (500 mg total) by mouth every 6 (six) hours as needed. 30 tablet 0   albuterol (PROVENTIL) (2.5 MG/3ML) 0.083% nebulizer solution INHALE 1 VIAL VIA NEBULIZER EVERY 4 HOURS 360 mL 2   albuterol (VENTOLIN HFA) 108 (90 Base) MCG/ACT inhaler INHALE 1 OR 2 PUFFS BY MOUTH EVERY 6 HOURS AS NEEDED FOR WHEEZING OR SHORTNESS OF BREATH 8.5 g 5   Blood Pressure Monitor MISC 1 each by Does not apply route as directed. Dx: Hypertension 1 each 0   BREZTRI AEROSPHERE 160-9-4.8 MCG/ACT AERO INHALE TWO PUFFS INTO THE LUNGS IN THE MORNING ANDAT BEDTIME. 10.7 g 0   cetirizine (  ZYRTEC) 10 MG tablet Take 10 mg by mouth daily as needed for allergies.      diclofenac Sodium (VOLTAREN) 1 % GEL SMARTSIG:Gram(s) Topical 3 Times Daily PRN     DULoxetine (CYMBALTA) 30 MG capsule Take 1 capsule by mouth daily.     esomeprazole (NEXIUM) 20 MG capsule Take 20 mg by mouth daily.     FLUoxetine (PROZAC) 20 MG capsule 1 capsule     fluticasone (FLONASE) 50 MCG/ACT nasal spray Place 1 spray into both nostrils 2 (two) times daily as needed for allergies or rhinitis. 16 g 6   gabapentin (NEURONTIN) 800 MG tablet Take 800 mg by mouth 3 (three) times daily.     guaiFENesin (MUCINEX) 600 MG 12 hr tablet Take 2 tablets (1,200 mg total) by mouth every 12 (twelve) hours.  20 tablet 0   hydrOXYzine (VISTARIL) 50 MG capsule TAKE 1 CAPSULE BY MOUTH THREE TIMES DAILY AS NEEDED FOR ANXIETY 90 capsule 0   Ipratropium-Albuterol (COMBIVENT RESPIMAT) 20-100 MCG/ACT AERS respimat 1 puff as needed     meloxicam (MOBIC) 15 MG tablet Take 15 mg by mouth daily.     metFORMIN (GLUCOPHAGE) 1000 MG tablet Take 1,000 mg by mouth 2 (two) times daily.     nicotine (NICODERM CQ - DOSED IN MG/24 HOURS) 21 mg/24hr patch 1 patch to skin     nitroGLYCERIN (NITROSTAT) 0.4 MG SL tablet Place under the tongue.     olmesartan (BENICAR) 20 MG tablet Take 1 tablet (20 mg total) by mouth daily. 30 tablet 11   olmesartan-hydrochlorothiazide (BENICAR HCT) 20-12.5 MG tablet Take 1 tablet by mouth daily. 30 tablet 11   omeprazole (PRILOSEC) 20 MG capsule Take 20 mg by mouth daily.     omeprazole (PRILOSEC) 40 MG capsule 1 capsule 30 minutes before morning meal     oxybutynin (DITROPAN-XL) 5 MG 24 hr tablet Take 5 mg by mouth daily.     OXYGEN Inhale 4.5 L into the lungs continuous.      predniSONE (DELTASONE) 10 MG tablet Take 2 tablets daily until better then 1 tablet daily 30 tablet 0   RESTASIS 0.05 % ophthalmic emulsion 1 drop 2 (two) times daily.     rOPINIRole (REQUIP) 1 MG tablet Take 1 mg by mouth 3 (three) times daily.      simvastatin (ZOCOR) 10 MG tablet Take 10 mg by mouth daily.     simvastatin (ZOCOR) 20 MG tablet Take 20 mg by mouth daily.     SUMAtriptan (IMITREX) 25 MG tablet Take 25 mg by mouth every 2 (two) hours as needed for migraine.     torsemide (DEMADEX) 20 MG tablet Take 1 tablet (20 mg total) by mouth daily as needed. May take an additional 20 mg daily as needed for extreme swelling. 90 tablet 1   ARIPiprazole (ABILIFY) 10 MG tablet Take 10 mg by mouth daily. (Patient not taking: Reported on 12/16/2021)     fluticasone (FLONASE) 50 MCG/ACT nasal spray 1 spray in each nostril     SUMAtriptan (IMITREX) 50 MG tablet 1 tablet at least 2 hours between doses as needed for  migraine headache     No current facility-administered medications on file prior to visit.       Observations/Objective: O2 sats 94% on 4l/m O2 .  FEV1 51% of predicted 1.46L in 2018  Appears chronically ill, no acute distress  Assessment and Plan: Acute COPD exacerbation slow to resolve.  Patient is prone to recurrent flares.  Also high risk for decompensation with associated chronic respiratory failure on 4 to 5 L of oxygen. We will treat with empiric antibiotics and continue on steroids at this time.  Have close follow-up in 1 week with a chest x-ray. Continue on Breztri triple therapy inhaler.  Continue on nebs with albuterol as needed  Chronic respiratory failure.  No increased oxygen demands.  Continue on oxygen to maintain O2 saturations greater than 88 to 9%  Tobacco abuse.  Work on not smoking smoking cessation discussed  Plan  Patient Instructions  Doxycycline 100mg  Twice daily  for 1 week, take with food, wear sunscreen .  Mucinex DM Twice daily  As needed  cough/congestion  Tessalon Three times a day  As needed  cough .  Continue on Prednisone 20mg  daily until seen in office .  Continue on Breztri 2 puffs twice daily, rinse after use Albuterol inhaler or nebulizer as needed Continue on oxygen 4 to 5 L to maintain O2 saturations greater than 88 to 90% Follow up in 1 week with chest xray with Bailey Faiella NP or Dr Melvyn Novas .  Please contact office for sooner follow up if symptoms do not improve or worsen or seek emergency care   '    Follow Up Instructions:    I discussed the assessment and treatment plan with the patient. The patient was provided an opportunity to ask questions and all were answered. The patient agreed with the plan and demonstrated an understanding of the instructions.   The patient was advised to call back or seek an in-person evaluation if the symptoms worsen or if the condition fails to improve as anticipated.  I provided 30  minutes of  non-face-to-face time during this encounter.   Rexene Edison, NP

## 2021-12-16 NOTE — Patient Instructions (Addendum)
Doxycycline 100mg  Twice daily  for 1 week, take with food, wear sunscreen .  Mucinex DM Twice daily  As needed  cough/congestion  Tessalon Three times a day  As needed  cough .  Continue on Prednisone 20mg  daily until seen in office .  Continue on Breztri 2 puffs twice daily, rinse after use Albuterol inhaler or nebulizer as needed Continue on oxygen 4 to 5 L to maintain O2 saturations greater than 88 to 90% Follow up in 1 week with chest xray with Stanlee Roehrig NP or Dr Melvyn Novas .  Please contact office for sooner follow up if symptoms do not improve or worsen or seek emergency care

## 2021-12-21 ENCOUNTER — Ambulatory Visit: Payer: Medicare Other | Admitting: Internal Medicine

## 2021-12-24 ENCOUNTER — Telehealth: Payer: Self-pay | Admitting: *Deleted

## 2021-12-24 ENCOUNTER — Ambulatory Visit: Payer: Medicare Other | Admitting: Adult Health

## 2021-12-24 NOTE — Telephone Encounter (Signed)
ATC patient. LVMTCB. 

## 2021-12-24 NOTE — Telephone Encounter (Signed)
Patient would like the NP to send in another antibiotic instead of the doxycycline because she said it is not making her feel better.  She also called today to reschedule her appointment and would like something until her appt. On 7/11.  Please advise.  CB# [phone]

## 2021-12-28 ENCOUNTER — Telehealth: Payer: Self-pay | Admitting: Cardiology

## 2021-12-28 NOTE — Telephone Encounter (Signed)
[name] has CP last nite which was relieved by 2 NTG. She states her lips got cold and she felt anxious. She has had no symptoms since. She understands the 3 NTG rule, then ED evaluation.    She has apt scheduled on 7/ 24/23 with Dr.Branch.      I will FYI Dr.Branch

## 2021-12-29 NOTE — Telephone Encounter (Signed)
Agree with monitoring at this time. History of prior chest pain with normal stress test just last year  Dominga Ferry MD

## 2021-12-29 NOTE — Telephone Encounter (Signed)
Returned call to pt. No answer. Left msg to call back.  

## 2021-12-30 ENCOUNTER — Ambulatory Visit: Payer: Medicare Other | Admitting: Podiatry

## 2021-12-30 NOTE — Telephone Encounter (Signed)
Attempted to call pt but line went directly to VM. Unable to leave VM as mailbox is full. Will try to call back later.

## 2021-12-30 NOTE — Telephone Encounter (Signed)
Patient is returning phone call. Patient phone number is [phone].

## 2022-01-02 ENCOUNTER — Emergency Department (HOSPITAL_COMMUNITY): Payer: Medicare Other

## 2022-01-02 ENCOUNTER — Inpatient Hospital Stay (HOSPITAL_COMMUNITY)
Admission: EM | Admit: 2022-01-02 | Discharge: 2022-01-06 | DRG: 917 | Disposition: A | Discharge status: Home / Self Care | Payer: Medicare Other | Attending: Internal Medicine | Admitting: Internal Medicine

## 2022-01-02 ENCOUNTER — Other Ambulatory Visit: Payer: Self-pay

## 2022-01-02 ENCOUNTER — Inpatient Hospital Stay (HOSPITAL_COMMUNITY): Payer: Medicare Other

## 2022-01-02 ENCOUNTER — Encounter (HOSPITAL_COMMUNITY): Payer: Self-pay | Admitting: Emergency Medicine

## 2022-01-02 DIAGNOSIS — I420 Dilated cardiomyopathy: Secondary | ICD-10-CM | POA: Diagnosis present

## 2022-01-02 DIAGNOSIS — F1320 Sedative, hypnotic or anxiolytic dependence, uncomplicated: Secondary | ICD-10-CM | POA: Diagnosis present

## 2022-01-02 DIAGNOSIS — R410 Disorientation, unspecified: Secondary | ICD-10-CM | POA: Diagnosis present

## 2022-01-02 DIAGNOSIS — F14129 Cocaine abuse with intoxication, unspecified: Secondary | ICD-10-CM | POA: Diagnosis present

## 2022-01-02 DIAGNOSIS — Z8701 Personal history of pneumonia (recurrent): Secondary | ICD-10-CM

## 2022-01-02 DIAGNOSIS — E669 Obesity, unspecified: Secondary | ICD-10-CM | POA: Diagnosis present

## 2022-01-02 DIAGNOSIS — F1910 Other psychoactive substance abuse, uncomplicated: Secondary | ICD-10-CM | POA: Diagnosis not present

## 2022-01-02 DIAGNOSIS — Z6836 Body mass index (BMI) 36.0-36.9, adult: Secondary | ICD-10-CM

## 2022-01-02 DIAGNOSIS — F319 Bipolar disorder, unspecified: Secondary | ICD-10-CM | POA: Diagnosis present

## 2022-01-02 DIAGNOSIS — Z9981 Dependence on supplemental oxygen: Secondary | ICD-10-CM

## 2022-01-02 DIAGNOSIS — F1010 Alcohol abuse, uncomplicated: Secondary | ICD-10-CM | POA: Diagnosis present

## 2022-01-02 DIAGNOSIS — G928 Other toxic encephalopathy: Secondary | ICD-10-CM | POA: Diagnosis present

## 2022-01-02 DIAGNOSIS — J441 Chronic obstructive pulmonary disease with (acute) exacerbation: Secondary | ICD-10-CM | POA: Diagnosis present

## 2022-01-02 DIAGNOSIS — J9611 Chronic respiratory failure with hypoxia: Secondary | ICD-10-CM | POA: Diagnosis present

## 2022-01-02 DIAGNOSIS — I248 Other forms of acute ischemic heart disease: Secondary | ICD-10-CM | POA: Diagnosis present

## 2022-01-02 DIAGNOSIS — R778 Other specified abnormalities of plasma proteins: Secondary | ICD-10-CM | POA: Diagnosis not present

## 2022-01-02 DIAGNOSIS — F17210 Nicotine dependence, cigarettes, uncomplicated: Secondary | ICD-10-CM | POA: Diagnosis present

## 2022-01-02 DIAGNOSIS — I110 Hypertensive heart disease with heart failure: Secondary | ICD-10-CM | POA: Diagnosis present

## 2022-01-02 DIAGNOSIS — I214 Non-ST elevation (NSTEMI) myocardial infarction: Secondary | ICD-10-CM | POA: Diagnosis not present

## 2022-01-02 DIAGNOSIS — D72829 Elevated white blood cell count, unspecified: Secondary | ICD-10-CM | POA: Diagnosis present

## 2022-01-02 DIAGNOSIS — J439 Emphysema, unspecified: Secondary | ICD-10-CM | POA: Diagnosis present

## 2022-01-02 DIAGNOSIS — T424X1A Poisoning by benzodiazepines, accidental (unintentional), initial encounter: Secondary | ICD-10-CM | POA: Diagnosis present

## 2022-01-02 DIAGNOSIS — N390 Urinary tract infection, site not specified: Secondary | ICD-10-CM | POA: Diagnosis present

## 2022-01-02 DIAGNOSIS — T510X1A Toxic effect of ethanol, accidental (unintentional), initial encounter: Secondary | ICD-10-CM | POA: Diagnosis present

## 2022-01-02 DIAGNOSIS — Z881 Allergy status to other antibiotic agents status: Secondary | ICD-10-CM

## 2022-01-02 DIAGNOSIS — R451 Restlessness and agitation: Principal | ICD-10-CM

## 2022-01-02 DIAGNOSIS — Z87442 Personal history of urinary calculi: Secondary | ICD-10-CM

## 2022-01-02 DIAGNOSIS — E871 Hypo-osmolality and hyponatremia: Secondary | ICD-10-CM | POA: Diagnosis present

## 2022-01-02 DIAGNOSIS — J9621 Acute and chronic respiratory failure with hypoxia: Secondary | ICD-10-CM | POA: Diagnosis not present

## 2022-01-02 DIAGNOSIS — Z7952 Long term (current) use of systemic steroids: Secondary | ICD-10-CM

## 2022-01-02 DIAGNOSIS — Z7984 Long term (current) use of oral hypoglycemic drugs: Secondary | ICD-10-CM

## 2022-01-02 DIAGNOSIS — T405X1A Poisoning by cocaine, accidental (unintentional), initial encounter: Secondary | ICD-10-CM | POA: Diagnosis present

## 2022-01-02 DIAGNOSIS — L89311 Pressure ulcer of right buttock, stage 1: Secondary | ICD-10-CM | POA: Diagnosis present

## 2022-01-02 DIAGNOSIS — Z791 Long term (current) use of non-steroidal anti-inflammatories (NSAID): Secondary | ICD-10-CM

## 2022-01-02 DIAGNOSIS — E119 Type 2 diabetes mellitus without complications: Secondary | ICD-10-CM | POA: Diagnosis present

## 2022-01-02 DIAGNOSIS — I5021 Acute systolic (congestive) heart failure: Secondary | ICD-10-CM | POA: Diagnosis present

## 2022-01-02 DIAGNOSIS — Z880 Allergy status to penicillin: Secondary | ICD-10-CM

## 2022-01-02 DIAGNOSIS — Z79899 Other long term (current) drug therapy: Secondary | ICD-10-CM

## 2022-01-02 DIAGNOSIS — L89321 Pressure ulcer of left buttock, stage 1: Secondary | ICD-10-CM | POA: Diagnosis present

## 2022-01-02 DIAGNOSIS — I10 Essential (primary) hypertension: Secondary | ICD-10-CM | POA: Diagnosis present

## 2022-01-02 DIAGNOSIS — L8990 Pressure ulcer of unspecified site, unspecified stage: Secondary | ICD-10-CM | POA: Insufficient documentation

## 2022-01-02 DIAGNOSIS — E785 Hyperlipidemia, unspecified: Secondary | ICD-10-CM | POA: Diagnosis present

## 2022-01-02 LAB — COMPREHENSIVE METABOLIC PANEL
ALT: 27 U/L (ref 0–44)
AST: 32 U/L (ref 15–41)
Albumin: 4 g/dL (ref 3.5–5.0)
Alkaline Phosphatase: 75 U/L (ref 38–126)
Anion gap: 13 (ref 5–15)
BUN: 13 mg/dL (ref 8–23)
CO2: 24 mmol/L (ref 22–32)
Calcium: 10.6 mg/dL — ABNORMAL HIGH (ref 8.9–10.3)
Chloride: 88 mmol/L — ABNORMAL LOW (ref 98–111)
Creatinine, Ser: 0.64 mg/dL (ref 0.44–1.00)
GFR, Estimated: >60 mL/min (ref >60)
Glucose, Bld: 162 mg/dL — ABNORMAL HIGH (ref 70–99)
Potassium: 4.3 mmol/L (ref 3.5–5.1)
Sodium: 125 mmol/L — ABNORMAL LOW (ref 135–145)
Total Bilirubin: 0.3 mg/dL (ref 0.3–1.2)
Total Protein: 7.5 g/dL (ref 6.5–8.1)

## 2022-01-02 LAB — TROPONIN I (HIGH SENSITIVITY)
Troponin I (High Sensitivity): 100 ng/L (ref <18)
Troponin I (High Sensitivity): 2788 ng/L (ref <18)

## 2022-01-02 LAB — CBC WITH DIFFERENTIAL/PLATELET
Abs Immature Granulocytes: 0.65 10*3/uL — ABNORMAL HIGH (ref 0.00–0.07)
Basophils Absolute: 0.1 10*3/uL (ref 0.0–0.1)
Basophils Relative: 0 %
Eosinophils Absolute: 0 10*3/uL (ref 0.0–0.5)
Eosinophils Relative: 0 %
HCT: 35.6 % — ABNORMAL LOW (ref 36.0–46.0)
Hemoglobin: 12.3 g/dL (ref 12.0–15.0)
Immature Granulocytes: 3 %
Lymphocytes Relative: 9 %
Lymphs Abs: 2.4 10*3/uL (ref 0.7–4.0)
MCH: 32 pg (ref 26.0–34.0)
MCHC: 34.6 g/dL (ref 30.0–36.0)
MCV: 92.7 fL (ref 80.0–100.0)
Monocytes Absolute: 1.9 10*3/uL — ABNORMAL HIGH (ref 0.1–1.0)
Monocytes Relative: 7 %
Neutro Abs: 20.9 10*3/uL — ABNORMAL HIGH (ref 1.7–7.7)
Neutrophils Relative %: 81 %
Platelets: 339 10*3/uL (ref 150–400)
RBC: 3.84 MIL/uL — ABNORMAL LOW (ref 3.87–5.11)
RDW: 12.5 % (ref 11.5–15.5)
WBC: 26 10*3/uL — ABNORMAL HIGH (ref 4.0–10.5)
nRBC: 0 % (ref 0.0–0.2)

## 2022-01-02 LAB — BASIC METABOLIC PANEL
Anion gap: 10 (ref 5–15)
BUN: 14 mg/dL (ref 8–23)
CO2: 27 mmol/L (ref 22–32)
Calcium: 10 mg/dL (ref 8.9–10.3)
Chloride: 90 mmol/L — ABNORMAL LOW (ref 98–111)
Creatinine, Ser: 0.65 mg/dL (ref 0.44–1.00)
GFR, Estimated: >60 mL/min (ref >60)
Glucose, Bld: 174 mg/dL — ABNORMAL HIGH (ref 70–99)
Potassium: 4.3 mmol/L (ref 3.5–5.1)
Sodium: 127 mmol/L — ABNORMAL LOW (ref 135–145)

## 2022-01-02 LAB — URINALYSIS, ROUTINE W REFLEX MICROSCOPIC
Bacteria, UA: NONE SEEN
Bilirubin Urine: NEGATIVE
Glucose, UA: 150 mg/dL — AB
Ketones, ur: NEGATIVE mg/dL
Leukocytes,Ua: NEGATIVE
Nitrite: NEGATIVE
Protein, ur: 100 mg/dL — AB
Specific Gravity, Urine: 1.014 (ref 1.005–1.030)
pH: 5 (ref 5.0–8.0)

## 2022-01-02 LAB — CBG MONITORING, ED: Glucose-Capillary: 166 mg/dL — ABNORMAL HIGH (ref 70–99)

## 2022-01-02 LAB — RAPID URINE DRUG SCREEN, HOSP PERFORMED
Amphetamines: NOT DETECTED
Barbiturates: NOT DETECTED
Benzodiazepines: POSITIVE — AB
Cocaine: POSITIVE — AB
Opiates: NOT DETECTED
Tetrahydrocannabinol: NOT DETECTED

## 2022-01-02 LAB — LACTIC ACID, PLASMA
Lactic Acid, Venous: 2.1 mmol/L (ref 0.5–1.9)
Lactic Acid, Venous: 0.9 mmol/L (ref 0.5–1.9)

## 2022-01-02 LAB — SALICYLATE LEVEL: Salicylate Lvl: <7 mg/dL — ABNORMAL LOW (ref 7.0–30.0)

## 2022-01-02 LAB — CK
Total CK: 289 U/L — ABNORMAL HIGH (ref 38–234)
Total CK: 670 U/L — ABNORMAL HIGH (ref 38–234)

## 2022-01-02 LAB — ACETAMINOPHEN LEVEL: Acetaminophen (Tylenol), Serum: <10 ug/mL — ABNORMAL LOW (ref 10–30)

## 2022-01-02 LAB — ETHANOL: Alcohol, Ethyl (B): 13 mg/dL — ABNORMAL HIGH (ref <10)

## 2022-01-02 MED ORDER — SODIUM CHLORIDE 0.9 % IV SOLN: INTRAVENOUS | Status: DC

## 2022-01-02 MED ORDER — LORAZEPAM 2 MG/ML IJ SOLN: 1.0000 mg | Freq: Once | INTRAMUSCULAR | Status: AC

## 2022-01-02 MED ORDER — HEPARIN BOLUS VIA INFUSION
4000.0000 [IU] | Freq: Once | INTRAVENOUS | Status: AC
  Administered 2022-01-02: 4000 [IU] via INTRAVENOUS

## 2022-01-02 MED ORDER — HALOPERIDOL LACTATE 5 MG/ML IJ SOLN
5.0000 mg | Freq: Four times a day (QID) | INTRAMUSCULAR | Status: DC | PRN
Start: 2022-01-02 — End: 2022-01-06
  Filled 2022-01-02: qty 1

## 2022-01-02 MED ORDER — METHYLPREDNISOLONE SODIUM SUCC 125 MG IJ SOLR
60.0000 mg | Freq: Two times a day (BID) | INTRAMUSCULAR | Status: DC
  Administered 2022-01-03 – 2022-01-04 (×3): 60 mg via INTRAVENOUS
  Filled 2022-01-02 (×3): qty 2

## 2022-01-02 MED ORDER — SODIUM CHLORIDE 0.9 % IV BOLUS
1000.0000 mL | Freq: Once | INTRAVENOUS | Status: AC
  Administered 2022-01-02: 1000 mL via INTRAVENOUS

## 2022-01-02 MED ORDER — PANTOPRAZOLE SODIUM 40 MG IV SOLR: 40.0000 mg | INTRAVENOUS | Status: DC

## 2022-01-02 MED ORDER — HALOPERIDOL LACTATE 5 MG/ML IJ SOLN
10.0000 mg | Freq: Once | INTRAMUSCULAR | Status: AC
  Administered 2022-01-02: 10 mg via INTRAVENOUS

## 2022-01-02 MED ORDER — LORAZEPAM 2 MG/ML IJ SOLN: 0.0000 mg | Freq: Two times a day (BID) | INTRAMUSCULAR | Status: DC

## 2022-01-02 MED ORDER — ONDANSETRON HCL 4 MG PO TABS: 4.0000 mg | ORAL_TABLET | Freq: Four times a day (QID) | ORAL | Status: DC | PRN

## 2022-01-02 MED ORDER — LORAZEPAM 2 MG/ML IJ SOLN
1.0000 mg | Freq: Once | INTRAMUSCULAR | Status: AC
  Administered 2022-01-02: 1 mg via INTRAVENOUS
  Filled 2022-01-02: qty 1

## 2022-01-02 MED ORDER — FOLIC ACID 1 MG PO TABS
1.0000 mg | ORAL_TABLET | Freq: Every day | ORAL | Status: DC
  Administered 2022-01-04 – 2022-01-06 (×3): 1 mg via ORAL
  Filled 2022-01-02 (×3): qty 1

## 2022-01-02 MED ORDER — HALOPERIDOL LACTATE 5 MG/ML IJ SOLN: 10.0000 mg | Freq: Once | INTRAMUSCULAR | Status: DC

## 2022-01-02 MED ORDER — SODIUM CHLORIDE 0.9 % IV SOLN
500.0000 mg | INTRAVENOUS | Status: AC
  Administered 2022-01-02: 500 mg via INTRAVENOUS
  Filled 2022-01-02: qty 5

## 2022-01-02 MED ORDER — LORAZEPAM 2 MG/ML IJ SOLN
1.0000 mg | Freq: Once | INTRAMUSCULAR | Status: AC
  Administered 2022-01-02: 1 mg via INTRAVENOUS

## 2022-01-02 MED ORDER — HYDRALAZINE HCL 20 MG/ML IJ SOLN: 5.0000 mg | INTRAMUSCULAR | Status: DC | PRN

## 2022-01-02 MED ORDER — IPRATROPIUM-ALBUTEROL 0.5-2.5 (3) MG/3ML IN SOLN
3.0000 mL | Freq: Once | RESPIRATORY_TRACT | Status: AC
  Administered 2022-01-02: 3 mL via RESPIRATORY_TRACT
  Filled 2022-01-02: qty 3

## 2022-01-02 MED ORDER — ADULT MULTIVITAMIN W/MINERALS CH
1.0000 | ORAL_TABLET | Freq: Every day | ORAL | Status: DC
  Administered 2022-01-04 – 2022-01-06 (×3): 1 via ORAL
  Filled 2022-01-02 (×3): qty 1

## 2022-01-02 MED ORDER — PANTOPRAZOLE SODIUM 40 MG IV SOLR
40.0000 mg | Freq: Every day | INTRAVENOUS | Status: DC
Start: 2022-01-02 — End: 2022-01-06
  Administered 2022-01-02 – 2022-01-05 (×4): 40 mg via INTRAVENOUS
  Filled 2022-01-02 (×4): qty 10

## 2022-01-02 MED ORDER — INSULIN ASPART 100 UNIT/ML IJ SOLN
0.0000 [IU] | INTRAMUSCULAR | Status: DC
  Administered 2022-01-03: 7 [IU] via SUBCUTANEOUS
  Administered 2022-01-03: 4 [IU] via SUBCUTANEOUS
  Administered 2022-01-03: 7 [IU] via SUBCUTANEOUS
  Administered 2022-01-03 – 2022-01-04 (×6): 4 [IU] via SUBCUTANEOUS
  Administered 2022-01-04: 7 [IU] via SUBCUTANEOUS
  Administered 2022-01-04 (×2): 4 [IU] via SUBCUTANEOUS
  Administered 2022-01-05: 15 [IU] via SUBCUTANEOUS
  Administered 2022-01-05: 7 [IU] via SUBCUTANEOUS
  Administered 2022-01-05 – 2022-01-06 (×4): 4 [IU] via SUBCUTANEOUS
  Administered 2022-01-06: 3 [IU] via SUBCUTANEOUS
  Filled 2022-01-02: qty 1

## 2022-01-02 MED ORDER — IPRATROPIUM-ALBUTEROL 0.5-2.5 (3) MG/3ML IN SOLN
3.0000 mL | Freq: Four times a day (QID) | RESPIRATORY_TRACT | Status: DC
  Administered 2022-01-03 (×2): 3 mL via RESPIRATORY_TRACT
  Filled 2022-01-02 (×2): qty 3

## 2022-01-02 MED ORDER — ONDANSETRON HCL 4 MG/2ML IJ SOLN: 4.0000 mg | Freq: Four times a day (QID) | INTRAMUSCULAR | Status: DC | PRN

## 2022-01-02 MED ORDER — AZITHROMYCIN 500 MG PO TABS
500.0000 mg | ORAL_TABLET | Freq: Every day | ORAL | Status: DC
  Administered 2022-01-04 – 2022-01-05 (×2): 500 mg via ORAL
  Filled 2022-01-02 (×4): qty 1

## 2022-01-02 MED ORDER — METHYLPREDNISOLONE SODIUM SUCC 125 MG IJ SOLR
125.0000 mg | Freq: Once | INTRAMUSCULAR | Status: AC
Start: 2022-01-02 — End: 2022-01-02
  Administered 2022-01-02: 125 mg via INTRAVENOUS
  Filled 2022-01-02: qty 2

## 2022-01-02 MED ORDER — HALOPERIDOL LACTATE 5 MG/ML IJ SOLN
INTRAMUSCULAR | Status: AC
  Filled 2022-01-02: qty 2

## 2022-01-02 MED ORDER — LORAZEPAM 1 MG PO TABS: 1.0000 mg | ORAL_TABLET | ORAL | Status: AC | PRN

## 2022-01-02 MED ORDER — LORAZEPAM 2 MG/ML IJ SOLN: 1.0000 mg | Freq: Once | INTRAMUSCULAR | Status: DC

## 2022-01-02 MED ORDER — THIAMINE HCL 100 MG/ML IJ SOLN
100.0000 mg | Freq: Every day | INTRAMUSCULAR | Status: DC
  Administered 2022-01-03: 100 mg via INTRAVENOUS
  Filled 2022-01-02: qty 2

## 2022-01-02 MED ORDER — LORAZEPAM 2 MG/ML IJ SOLN
0.0000 mg | Freq: Four times a day (QID) | INTRAMUSCULAR | Status: AC
  Filled 2022-01-02 (×2): qty 1

## 2022-01-02 MED ORDER — ALBUTEROL SULFATE (2.5 MG/3ML) 0.083% IN NEBU
2.5000 mg | INHALATION_SOLUTION | RESPIRATORY_TRACT | Status: DC | PRN
  Administered 2022-01-04 – 2022-01-06 (×2): 2.5 mg via RESPIRATORY_TRACT
  Filled 2022-01-02 (×3): qty 3

## 2022-01-02 MED ORDER — HALOPERIDOL LACTATE 5 MG/ML IJ SOLN
5.0000 mg | Freq: Once | INTRAMUSCULAR | Status: AC
  Administered 2022-01-02: 5 mg via INTRAVENOUS
  Filled 2022-01-02: qty 1

## 2022-01-02 MED ORDER — ENOXAPARIN SODIUM 40 MG/0.4ML IJ SOSY
40.0000 mg | PREFILLED_SYRINGE | INTRAMUSCULAR | Status: DC
Start: 2022-01-02 — End: 2022-01-02

## 2022-01-02 MED ORDER — HEPARIN (PORCINE) 25000 UT/250ML-% IV SOLN
1300.0000 [IU]/h | INTRAVENOUS | Status: AC
  Administered 2022-01-02: 1100 [IU]/h via INTRAVENOUS
  Administered 2022-01-03 – 2022-01-04 (×2): 1300 [IU]/h via INTRAVENOUS
  Filled 2022-01-02 (×3): qty 250

## 2022-01-02 MED ORDER — LORAZEPAM 2 MG/ML IJ SOLN
INTRAMUSCULAR | Status: AC
  Administered 2022-01-02: 1 mg via INTRAVENOUS
  Filled 2022-01-02: qty 1

## 2022-01-02 MED ORDER — THIAMINE HCL 100 MG PO TABS
100.0000 mg | ORAL_TABLET | Freq: Every day | ORAL | Status: DC
  Administered 2022-01-04 – 2022-01-06 (×3): 100 mg via ORAL
  Filled 2022-01-02 (×3): qty 1

## 2022-01-02 MED ORDER — SODIUM CHLORIDE 0.9 % IV BOLUS
500.0000 mL | Freq: Once | INTRAVENOUS | Status: AC
  Administered 2022-01-02: 500 mL via INTRAVENOUS

## 2022-01-02 MED ORDER — LORAZEPAM 2 MG/ML IJ SOLN
1.0000 mg | INTRAMUSCULAR | Status: AC | PRN
  Administered 2022-01-03: 2 mg via INTRAVENOUS

## 2022-01-02 MED ORDER — FLUTICASONE FUROATE-VILANTEROL 100-25 MCG/ACT IN AEPB: 1.0000 | INHALATION_SPRAY | Freq: Every day | RESPIRATORY_TRACT | Status: DC

## 2022-01-02 NOTE — ED Notes (Signed)
Pt off the unit in CT

## 2022-01-02 NOTE — ED Notes (Signed)
Pt continues to be restless with constant movements and will not follow commands long enough to obtain CT. See MAR for medication admin.

## 2022-01-02 NOTE — ED Notes (Signed)
Pt's spouse called to get an update. Spouse states that pt buys crack with her monthly check at the beginning of each month, roughly $300 worth. Pt uses a crack pipe. Spouse states that pt has drank 6 beer, maybe more or less.

## 2022-01-02 NOTE — ED Provider Notes (Signed)
[facility] EMERGENCY DEPARTMENT Provider Note   CSN: [id number] Arrival date & time: 01/02/22  0446     History  Chief Complaint  Patient presents with   Drug Problem    [name] is a 66 y.o. female.  Brought to the emergency department from home by EMS.  Husband reportedly called EMS because she was drinking and high on crack.  EMS report that she had several pill bottles in the bed with her.  Patient has been agitated during transport.  She reportedly fell off the stretcher when being transported prior to arrival.       Home Medications Prior to Admission medications   Medication Sig Start Date End Date Taking? Authorizing Provider  Accu-Chek FastClix Lancets MISC USE TO CHECK BLOODOGLUCOSE ONCE DAILY 09/18/19   [provider]  ACCU-CHEK GUIDE test strip USE TO CHECK BLOODEBLUCOSE ONCE DAILY. 09/18/19   [provider]  acetaminophen (TYLENOL) 500 MG tablet Take 1 tablet (500 mg total) by mouth every 6 (six) hours as needed. 02/26/21   Asencion Islam, DPM  albuterol (PROVENTIL) (2.5 MG/3ML) 0.083% nebulizer solution INHALE 1 VIAL VIA NEBULIZER EVERY 4 HOURS 07/14/21   Nyoka Cowden, MD  albuterol (VENTOLIN HFA) 108 (90 Base) MCG/ACT inhaler INHALE 1 OR 2 PUFFS BY MOUTH EVERY 6 HOURS AS NEEDED FOR WHEEZING OR SHORTNESS OF BREATH 11/05/21   Charlott Holler, MD  ARIPiprazole (ABILIFY) 10 MG tablet Take 10 mg by mouth daily. Patient not taking: Reported on 12/16/2021 04/02/21   [provider]  benzonatate (TESSALON) 200 MG capsule Take 1 capsule (200 mg total) by mouth 3 (three) times daily as needed for cough. 12/16/21 12/16/22  Parrett, Virgel Bouquet, NP  Blood Pressure Monitor MISC 1 each by Does not apply route as directed. Dx: Hypertension 06/15/21   Antoine Poche, MD  BREZTRI AEROSPHERE 160-9-4.8 MCG/ACT AERO INHALE TWO PUFFS INTO THE LUNGS IN THE MORNING ANDAT BEDTIME. 10/02/21   Nyoka Cowden, MD  cetirizine (ZYRTEC) 10 MG tablet Take 10 mg  by mouth daily as needed for allergies.  11/02/19   [provider]  diclofenac Sodium (VOLTAREN) 1 % GEL SMARTSIG:Gram(s) Topical 3 Times Daily PRN 08/17/21   [provider]  doxycycline (VIBRA-TABS) 100 MG tablet Take 1 tablet (100 mg total) by mouth 2 (two) times daily. 12/16/21   Parrett, Virgel Bouquet, NP  DULoxetine (CYMBALTA) 30 MG capsule Take 1 capsule by mouth daily. 04/10/21   [provider]  esomeprazole (NEXIUM) 20 MG capsule Take 20 mg by mouth daily. 08/11/21   [provider]  FLUoxetine (PROZAC) 20 MG capsule 1 capsule    [provider]  fluticasone (FLONASE) 50 MCG/ACT nasal spray Place 1 spray into both nostrils 2 (two) times daily as needed for allergies or rhinitis. 07/18/15   Dettinger, Elige Radon, MD  fluticasone (FLONASE) 50 MCG/ACT nasal spray 1 spray in each nostril    [provider]  gabapentin (NEURONTIN) 800 MG tablet Take 800 mg by mouth 3 (three) times daily. 04/23/19   [provider]  guaiFENesin (MUCINEX) 600 MG 12 hr tablet Take 2 tablets (1,200 mg total) by mouth every 12 (twelve) hours. 05/13/21   Nyoka Cowden, MD  hydrOXYzine (VISTARIL) 50 MG capsule TAKE 1 CAPSULE BY MOUTH THREE TIMES DAILY AS NEEDED FOR ANXIETY 03/23/21   Charlott Holler, MD  Ipratropium-Albuterol (COMBIVENT RESPIMAT) 20-100 MCG/ACT AERS respimat 1 puff as needed    [provider]  meloxicam (  MOBIC) 15 MG tablet Take 15 mg by mouth daily. 08/28/21   [provider]  metFORMIN (GLUCOPHAGE) 1000 MG tablet Take 1,000 mg by mouth 2 (two) times daily. 11/01/19   [provider]  nicotine (NICODERM CQ - DOSED IN MG/24 HOURS) 21 mg/24hr patch 1 patch to skin    [provider]  nitroGLYCERIN (NITROSTAT) 0.4 MG SL tablet Place under the tongue. 09/08/21   [provider]  olmesartan (BENICAR) 20 MG tablet Take 1 tablet (20 mg total) by mouth daily. 05/04/21   Nyoka Cowden, MD   olmesartan-hydrochlorothiazide (BENICAR HCT) 20-12.5 MG tablet Take 1 tablet by mouth daily. 05/05/21   Nyoka Cowden, MD  omeprazole (PRILOSEC) 20 MG capsule Take 20 mg by mouth daily. 08/17/21   [provider]  omeprazole (PRILOSEC) 40 MG capsule 1 capsule 30 minutes before morning meal    [provider]  oxybutynin (DITROPAN-XL) 5 MG 24 hr tablet Take 5 mg by mouth daily. 08/17/21   [provider]  OXYGEN Inhale 4.5 L into the lungs continuous.     [provider]  predniSONE (DELTASONE) 10 MG tablet Take 2 tablets daily until better then 1 tablet daily 12/10/21   Nyoka Cowden, MD  predniSONE (DELTASONE) 20 MG tablet Take 1 tablet (20 mg total) by mouth daily with breakfast. 12/16/21   Parrett, Tammy S, NP  RESTASIS 0.05 % ophthalmic emulsion 1 drop 2 (two) times daily. 08/24/21   [provider]  rOPINIRole (REQUIP) 1 MG tablet Take 1 mg by mouth 3 (three) times daily.  09/12/15   [provider]  simvastatin (ZOCOR) 10 MG tablet Take 10 mg by mouth daily. 11/29/19   [provider]  simvastatin (ZOCOR) 20 MG tablet Take 20 mg by mouth daily. 08/17/21   [provider]  SUMAtriptan (IMITREX) 25 MG tablet Take 25 mg by mouth every 2 (two) hours as needed for migraine. 12/23/20   [provider]  SUMAtriptan (IMITREX) 50 MG tablet 1 tablet at least 2 hours between doses as needed for migraine headache 02/17/21   [provider]  torsemide (DEMADEX) 20 MG tablet Take 1 tablet (20 mg total) by mouth daily as needed. May take an additional 20 mg daily as needed for extreme swelling. 04/17/21   Netta Neat., NP      Allergies    Penicillins and Septra [sulfamethoxazole-trimethoprim]    Review of Systems   Review of Systems  Physical Exam Updated Vital Signs Ht 5\' 4"  (1.626 m)   Wt 102 kg   BMI 38.60 kg/m  Physical Exam Vitals and nursing note reviewed.  Constitutional:      Appearance: She is  well-developed.  HENT:     Head: Normocephalic and atraumatic.     Mouth/Throat:     Mouth: Mucous membranes are moist.  Eyes:     General: Vision grossly intact. Gaze aligned appropriately.     Extraocular Movements: Extraocular movements intact.     Conjunctiva/sclera: Conjunctivae normal.  Cardiovascular:     Rate and Rhythm: Normal rate and regular rhythm.     Pulses: Normal pulses.     Heart sounds: Normal heart sounds, S1 normal and S2 normal. No murmur heard.    No friction rub. No gallop.  Pulmonary:     Effort: Pulmonary effort is normal. No respiratory distress.     Breath sounds: Normal breath sounds.  Abdominal:     General: Bowel sounds are normal.  Palpations: Abdomen is soft.     Tenderness: There is no abdominal tenderness. There is no guarding or rebound.     Hernia: No hernia is present.  Musculoskeletal:        General: No swelling.     Cervical back: Full passive range of motion without pain, normal range of motion and neck supple. No spinous process tenderness or muscular tenderness. Normal range of motion.     Right lower leg: No edema.     Left lower leg: No edema.  Skin:    General: Skin is warm and dry.     Capillary Refill: Capillary refill takes less than 2 seconds.     Findings: No ecchymosis, erythema, rash or wound.     Comments: Skin tears on right arm, right knee  Neurological:     General: No focal deficit present.     Mental Status: She is alert and oriented to person, place, and time.     GCS: GCS eye subscore is 4. GCS verbal subscore is 5. GCS motor subscore is 6.     Cranial Nerves: Cranial nerves 2-12 are intact.     Sensory: Sensation is intact.     Motor: Motor function is intact.     Coordination: Coordination is intact.  Psychiatric:        Attention and Perception: She is inattentive.        Behavior: Behavior is agitated.     ED Results / Procedures / Treatments   Labs (all labs ordered are listed, but only abnormal  results are displayed) Labs Reviewed  CBC WITH DIFFERENTIAL/PLATELET  COMPREHENSIVE METABOLIC PANEL  ETHANOL  RAPID URINE DRUG SCREEN, HOSP PERFORMED  TROPONIN I (HIGH SENSITIVITY)    EKG None  Radiology No results found.  Procedures Procedures    Medications Ordered in ED Medications  haloperidol lactate (HALDOL) injection 10 mg (has no administration in time range)  LORazepam (ATIVAN) injection 1 mg (1 mg Intravenous Given 01/02/22 0456)  haloperidol lactate (HALDOL) injection 10 mg (10 mg Intravenous Given 01/02/22 0454)    ED Course/ Medical Decision Making/ A&P                           Medical Decision Making Amount and/or Complexity of Data Reviewed Labs: ordered. Radiology: ordered.  Risk Prescription drug management. Decision regarding hospitalization.   Patient was brought to the emergency department by EMS from home.  Husband had called EMS because of agitation.  Husband reports that she smokes crack cocaine.  He thinks that she has overdosed.  At arrival to the emergency department, patient is extremely agitated and incoherent.  She cannot sit still in the bed.  She has trouble answering questions.  There are no focal findings.  Patient administered Haldol followed by multiple doses of Ativan to treat her agitation and presumed cocaine overdose.  She has been monitored longitudinally and is still agitated.  Will require further sedation and then will require CT imaging to further evaluate mental status changes.  Will sign out to oncoming ER physician to continue to treat patient and obtain imaging when appropriate.  CRITICAL CARE Performed by: Gilda Crease   Total critical care time: 30 minutes  Critical care time was exclusive of separately billable procedures and treating other patients.  Critical care was necessary to treat or prevent imminent or life-threatening deterioration.  Critical care was time spent personally by me on the following  activities: development  of treatment plan with patient and/or surrogate as well as nursing, discussions with consultants, evaluation of patient's response to treatment, examination of patient, obtaining history from patient or surrogate, ordering and performing treatments and interventions, ordering and review of laboratory studies, ordering and review of radiographic studies, pulse oximetry and re-evaluation of patient's condition.         Final Clinical Impression(s) / ED Diagnoses Final diagnoses:  None  Agitation; polysubstance abuse  Rx / DC Orders ED Discharge Orders     None         Simrah Chatham, Canary Brim, MD 01/03/22 [phone]

## 2022-01-02 NOTE — ED Provider Notes (Signed)
3:12 PM Care of the patient assumed at signout from Dr. Waverly Ferrari.  We are awaiting head CT imaging results for admission after this patient presented with delirium likely secondary to her crack cocaine use.  Patient has a preserved airway, though she is somnolent she does awaken, moves her extremities spontaneously, and offers her name.  After much delay CT was obtained, and this does not show new acute intracranial abnormality, consistent with suspicion for delirium likely secondary to intoxication with crack cocaine, though she is also benzodiazepine positive. Patient required admission to the stepdown unit for further monitoring, management.   Carmin Muskrat, MD 01/02/22 [phone]

## 2022-01-02 NOTE — ED Notes (Signed)
Pt is obtunded, responds to verbal stimuli. Restless constantly kicking at padded side rails and pulling on monitoring wires. Tachypnea with audible wheezing present. Lung sounds with rhonchi bilaterally.

## 2022-01-02 NOTE — Progress Notes (Addendum)
Patient Name: [name], female   DOB: 08/11/1955, 66 y.o.  MRN: [id number]   Called for critical lab: Troponin went from 200 this morning to 2800 at 1638. I have repeat EKG obtained showing J-point elevation in V3, V4, V5.  I discussed patient with cardiology at [facility].  Will admit the patient to [facility] and have cardiology consult.    Start heparin drip for at least 48 hours.  Echocardiogram in the morning.  CK is also elevated, likely rhabdo secondary to cocaine use.  We will aggressively hydrate.  We will recheck CK tonight and tomorrow morning.  Truett Mainland, DO 01/02/2022 5:51 PM

## 2022-01-02 NOTE — ED Notes (Signed)
Pt's IV noted to be infiltrated when flushed with saline.

## 2022-01-02 NOTE — ED Provider Notes (Signed)
Signout from Dr. Jeraldine Loots.  66 year old female history of substance abuse reportedly significant crack use overnight.  Here altered agitated.  Requiring medications for agitation.  Needs admission for continued delirium. Physical Exam  BP (!) 152/72   Pulse (!) 110   Temp 99 F (37.2 C) (Oral)   Resp (!) 22   Ht 5\' 4"  (1.626 m)   Wt 102 kg   SpO2 98%   BMI 38.60 kg/m   Physical Exam  Procedures  Procedures  ED Course / MDM    Medical Decision Making Amount and/or Complexity of Data Reviewed Labs: ordered. Radiology: ordered.  Risk Prescription drug management. Decision regarding hospitalization.   Discussed with Dr. Adrian Blackwater [facility] hospitalist will evaluate patient for admission.       Terrilee Files, MD 01/03/22 1052

## 2022-01-02 NOTE — ED Triage Notes (Signed)
Pt to ed via [facility] from home. C/o crack use, alcohol abuse, possible prescription drugs that do not belong to pt. Upon ems arrival, pt was found in the bed with alcohol and prescription bottles around them. Pt fell off of ems stretches and sustained several skin tears on left arm and legs. Pt combative and uncooperative. Pt arrived on 4L [place]. Pt wears O2 at home

## 2022-01-02 NOTE — Progress Notes (Signed)
ANTICOAGULATION CONSULT NOTE - Initial Consult  Pharmacy Consult for heparin Indication: chest pain/ACS  Allergies  Allergen Reactions   Penicillins Other (See Comments)    Patient is unsure if she allergic to penicillin or septra. Patient states one or another caused "rib pain with a little breathing problem". Has patient had a PCN reaction causing immediate rash, facial/tongue/throat swelling, SOB or lightheadedness with hypotension: YES Has patient had a PCN reaction causing severe rash involving mucus membranes or skin necrosis: NO Has patient had a PCN reaction that required hospitalization: NO Has patient had a PCN reaction occurring within the last 10 years: NO   Septra [Sulfamethoxazole-Trimethoprim] Other (See Comments)    Patient is unsure if she allergic to septra or penicillin. Patient states one or another caused "rib pain with a little breathing problem".    Patient Measurements: Height: 5\' 4"  (162.6 cm) Weight: 102 kg (224 lb 13.9 oz) IBW/kg (Calculated) : 54.7 Heparin Dosing Weight: 78 kg  Vital Signs: BP: 152/72 (07/01 1435) Pulse Rate: 110 (07/01 1435)  Labs: Recent Labs    01/02/22 0451 01/02/22 1638  HGB 12.3  --   HCT 35.6*  --   PLT 339  --   CREATININE 0.64 0.65  CKTOTAL 289* 670*  TROPONINIHS 100* 2,788*    Estimated Creatinine Clearance: 80.4 mL/min (by C-G formula based on SCr of 0.65 mg/dL).   Medical History: Past Medical History:  Diagnosis Date   Anemia    Anxiety    Arthritis    Bipolar 1 disorder (HCC)    CHF (congestive heart failure) (HCC)    COPD (chronic obstructive pulmonary disease) (HCC)    Depression    Diabetes mellitus without complication (HCC)    Dyspnea    Emphysema of lung (HCC)    GERD (gastroesophageal reflux disease)    Headache    migraines   History of hiatal hernia    History of kidney stones    Hyperlipidemia    Hypertension    Neuromuscular disorder (HCC)    neuropathy   On home oxygen therapy     Pneumonia 2015, 2019   Tracheomalacia    Vaginal Pap smear, abnormal     Medications:  (Not in a hospital admission)   Assessment: Pharmacy consulted to dose heparin in patient with chest pain/ACS.  Patient is not on anticoagulation prior to admission.  CBC WNL Trop 2,788  Goal of Therapy:  Heparin level 0.3-0.7 units/ml Monitor platelets by anticoagulation protocol: Yes   Plan:  Give 4000 units bolus x 1 Start heparin infusion at 1100 units/hr Check anti-Xa level in 6 hours and daily while on heparin Continue to monitor H&H and platelets  Margot Ables, PharmD Clinical Pharmacist 01/02/2022 6:10 PM

## 2022-01-02 NOTE — ED Notes (Signed)
Pillows and blankets provided. Pulse ox moved to ear for a true saturation reading. Oxygen turned down to 2L [place].  Pt is very bruised on her right arm because she keeps beating it against the stretcher rail. Seizure pads are placed on the stretcher.

## 2022-01-02 NOTE — ED Notes (Signed)
Pt has uncontrollable movements and incoherent speech

## 2022-01-02 NOTE — H&P (Addendum)
History and Physical    Patient: [name] ZOX:[id number] DOB: [birth year] DOA: 01/02/2022 DOS: the patient was seen and examined on 01/02/2022 PCP: Hyler, Gwen Her, NP  Patient coming from: Home  Chief Complaint:  Chief Complaint  Patient presents with   Drug Problem   HPI: [name] is a 66 y.o. female with medical history significant of COPD with chronic respiratory failure on home O2, COPD, hypertension, GERD, diabetes, polysubstance abuse.  History obtained by chart as patient very somnolent after multiple doses of Ativan and Haldol due to agitation. EMS called the patient's house by husband due to alcohol and cocaine use.  By EMS report, they were several bottles of prescription medications that did not belong to her lying around her bed.  Patient was found to be having uncontrollable movements and incoherent speech that persisted while in the emergency department.  The patient was given several doses of Ativan and Haldol in order to calm her.  The patient remained fairly incoherent throughout the whole time that she was evaluated by the ER staff.  While the emergency department, she did have blood pressures in the severe range, including 205/168.  She did maintain her oxygen saturations above 90, but had extremely coarse respirations.  Her white count was 26 and her sodium was 125 with a creatinine of 0.64.  Her troponin was found to be 100 and a lactic acid of 2.1.  Review of Systems: As mentioned in the history of present illness. All other systems reviewed and are negative. Past Medical History:  Diagnosis Date   Anemia    Anxiety    Arthritis    Bipolar 1 disorder (HCC)    CHF (congestive heart failure) (HCC)    COPD (chronic obstructive pulmonary disease) (HCC)    Depression    Diabetes mellitus without complication (HCC)    Dyspnea    Emphysema of lung (HCC)    GERD (gastroesophageal reflux disease)    Headache    migraines   History of hiatal hernia     History of kidney stones    Hyperlipidemia    Hypertension    Neuromuscular disorder (HCC)    neuropathy   On home oxygen therapy    Pneumonia 2015, 2019   Tracheomalacia    Vaginal Pap smear, abnormal    Past Surgical History:  Procedure Laterality Date   CATARACT EXTRACTION W/PHACO Left 01/14/2020   Procedure: CATARACT EXTRACTION PHACO AND INTRAOCULAR LENS PLACEMENT LEFT EYE;  Surgeon: Baruch Goldmann, MD;  Location: [facility];  Service: Ophthalmology;  Laterality: Left;  CDE: 8.18   CATARACT EXTRACTION W/PHACO Right 02/01/2020   Procedure: CATARACT EXTRACTION PHACO AND INTRAOCULAR LENS PLACEMENT RIGHT EYE;  Surgeon: Baruch Goldmann, MD;  Location: [facility];  Service: Ophthalmology;  Laterality: Right;  CDE: 9.94   CHEILECTOMY Right 01/28/2020   Procedure: KELLER BUNION IMPLANT RIGHT FOOT CHEILECTOMY RIGHT ROOT;  Surgeon: Landis Martins, DPM;  Location: [facility];  Service: Podiatry;  Laterality: Right;  BLOCK   CHOLECYSTECTOMY     COLONOSCOPY  July 2010   Dr. Arnoldo Morale: 3 rectal polyps, not enough tissue for pathologic examination, recommended surveillance in 3 years   COLONOSCOPY N/A 10/23/2014   RMR: Multiple colonic polyps removed as described above. No endoscopic explaniation for abdominal pain. however. next tcs 10/2019   ESOPHAGOGASTRODUODENOSCOPY  July 2010   Dr. Arnoldo Morale: gastritis and duodenitis, H.pylori negative   ESOPHAGOGASTRODUODENOSCOPY N/A 10/23/2014   RMR: Normal EGD. Status post passage of a Maloney dilator. Today's  finding s would not explain abdominal pain   EYE SURGERY Left 01/14/2020   cataract removal   GANGLION CYST EXCISION Left 09/07/2018   Procedure: REMOVAL GANGLION OF WRIST;  Surgeon: Carole Civil, MD;  Location: [facility];  Service: Orthopedics;  Laterality: Left;   HERNIA REPAIR     Dr. Arnoldo Morale   KIDNEY STONE SURGERY     MALONEY DILATION N/A 10/23/2014   Procedure: Venia Minks DILATION;  Surgeon: Daneil Dolin, MD;  Location: [facility];  Service: Endoscopy;   Laterality: N/A;   OPEN REDUCTION INTERNAL FIXATION (ORIF) DISTAL RADIAL FRACTURE Right 12/09/2015   Procedure: OPEN REDUCTION INTERNAL FIXATION (ORIF) RIGHT DISTAL RADIUS;  Surgeon: Leanora Cover, MD;  Location: [facility];  Service: Orthopedics;  Laterality: Right;   Social History:  reports that she has been smoking cigarettes. She started smoking about 54 years ago. She has a 52.00 pack-year smoking history. She has never used smokeless tobacco. She reports current alcohol use of about 1.0 standard drink of alcohol per week. She reports current drug use. Drug: Cocaine.  Allergies  Allergen Reactions   Penicillins Other (See Comments)    Patient is unsure if she allergic to penicillin or septra. Patient states one or another caused "rib pain with a little breathing problem". Has patient had a PCN reaction causing immediate rash, facial/tongue/throat swelling, SOB or lightheadedness with hypotension: YES Has patient had a PCN reaction causing severe rash involving mucus membranes or skin necrosis: NO Has patient had a PCN reaction that required hospitalization: NO Has patient had a PCN reaction occurring within the last 10 years: NO   Septra [Sulfamethoxazole-Trimethoprim] Other (See Comments)    Patient is unsure if she allergic to septra or penicillin. Patient states one or another caused "rib pain with a little breathing problem".    Family History  Adopted: Yes    Prior to Admission medications   Medication Sig Start Date End Date Taking? Authorizing Provider  Accu-Chek FastClix Lancets MISC USE TO CHECK BLOODOGLUCOSE ONCE DAILY 09/18/19   [provider]  ACCU-CHEK GUIDE test strip USE TO CHECK [place]. 09/18/19   [provider]  acetaminophen (TYLENOL) 500 MG tablet Take 1 tablet (500 mg total) by mouth every 6 (six) hours as needed. 02/26/21   Landis Martins, DPM  albuterol (PROVENTIL) (2.5 MG/3ML) 0.083% nebulizer solution INHALE 1  VIAL VIA NEBULIZER EVERY 4 HOURS 07/14/21   Tanda Rockers, MD  albuterol (VENTOLIN HFA) 108 (90 Base) MCG/ACT inhaler INHALE 1 OR 2 PUFFS BY MOUTH EVERY 6 HOURS AS NEEDED FOR WHEEZING OR SHORTNESS OF BREATH 11/05/21   Spero Geralds, MD  ARIPiprazole (ABILIFY) 10 MG tablet Take 10 mg by mouth daily. Patient not taking: Reported on 12/16/2021 04/02/21   [provider]  benzonatate (TESSALON) 200 MG capsule Take 1 capsule (200 mg total) by mouth 3 (three) times daily as needed for cough. 12/16/21 12/16/22  Parrett, Fonnie Mu, NP  Blood Pressure Monitor MISC 1 each by Does not apply route as directed. Dx: Hypertension 06/15/21   Arnoldo Lenis, MD  BREZTRI AEROSPHERE 160-9-4.8 MCG/ACT AERO INHALE TWO PUFFS INTO THE LUNGS IN THE MORNING ANDAT BEDTIME. 10/02/21   Tanda Rockers, MD  cetirizine (ZYRTEC) 10 MG tablet Take 10 mg by mouth daily as needed for allergies.  11/02/19   [provider]  diclofenac Sodium (VOLTAREN) 1 % GEL SMARTSIG:Gram(s) Topical 3 Times Daily PRN 08/17/21   [provider]  doxycycline (  VIBRA-TABS) 100 MG tablet Take 1 tablet (100 mg total) by mouth 2 (two) times daily. 12/16/21   Parrett, Fonnie Mu, NP  DULoxetine (CYMBALTA) 30 MG capsule Take 1 capsule by mouth daily. 04/10/21   [provider]  esomeprazole (NEXIUM) 20 MG capsule Take 20 mg by mouth daily. 08/11/21   [provider]  FLUoxetine (PROZAC) 20 MG capsule 1 capsule    [provider]  fluticasone (FLONASE) 50 MCG/ACT nasal spray Place 1 spray into both nostrils 2 (two) times daily as needed for allergies or rhinitis. 07/18/15   Dettinger, Fransisca Kaufmann, MD  fluticasone (FLONASE) 50 MCG/ACT nasal spray 1 spray in each nostril    [provider]  gabapentin (NEURONTIN) 800 MG tablet Take 800 mg by mouth 3 (three) times daily. 04/23/19   [provider]  guaiFENesin (MUCINEX) 600 MG 12 hr tablet Take 2 tablets (1,200 mg total) by mouth every 12 (twelve) hours.  05/13/21   Tanda Rockers, MD  hydrOXYzine (VISTARIL) 50 MG capsule TAKE 1 CAPSULE BY MOUTH THREE TIMES DAILY AS NEEDED FOR ANXIETY 03/23/21   Spero Geralds, MD  Ipratropium-Albuterol (COMBIVENT RESPIMAT) 20-100 MCG/ACT AERS respimat 1 puff as needed    [provider]  meloxicam (MOBIC) 15 MG tablet Take 15 mg by mouth daily. 08/28/21   [provider]  metFORMIN (GLUCOPHAGE) 1000 MG tablet Take 1,000 mg by mouth 2 (two) times daily. 11/01/19   [provider]  nicotine (NICODERM CQ - DOSED IN MG/24 HOURS) 21 mg/24hr patch 1 patch to skin    [provider]  nitroGLYCERIN (NITROSTAT) 0.4 MG SL tablet Place under the tongue. 09/08/21   [provider]  olmesartan (BENICAR) 20 MG tablet Take 1 tablet (20 mg total) by mouth daily. 05/04/21   Tanda Rockers, MD  olmesartan-hydrochlorothiazide (BENICAR HCT) 20-12.5 MG tablet Take 1 tablet by mouth daily. 05/05/21   Tanda Rockers, MD  omeprazole (PRILOSEC) 20 MG capsule Take 20 mg by mouth daily. 08/17/21   [provider]  omeprazole (PRILOSEC) 40 MG capsule 1 capsule 30 minutes before morning meal    [provider]  oxybutynin (DITROPAN-XL) 5 MG 24 hr tablet Take 5 mg by mouth daily. 08/17/21   [provider]  OXYGEN Inhale 4.5 L into the lungs continuous.     [provider]  predniSONE (DELTASONE) 10 MG tablet Take 2 tablets daily until better then 1 tablet daily 12/10/21   Tanda Rockers, MD  predniSONE (DELTASONE) 20 MG tablet Take 1 tablet (20 mg total) by mouth daily with breakfast. 12/16/21   Parrett, Tammy S, NP  RESTASIS 0.05 % ophthalmic emulsion 1 drop 2 (two) times daily. 08/24/21   [provider]  rOPINIRole (REQUIP) 1 MG tablet Take 1 mg by mouth 3 (three) times daily.  09/12/15   [provider]  simvastatin (ZOCOR) 10 MG tablet Take 10 mg by mouth daily. 11/29/19   [provider]  simvastatin (ZOCOR) 20 MG tablet Take 20 mg by mouth  daily. 08/17/21   [provider]  SUMAtriptan (IMITREX) 25 MG tablet Take 25 mg by mouth every 2 (two) hours as needed for migraine. 12/23/20   [provider]  SUMAtriptan (IMITREX) 50 MG tablet 1 tablet at least 2 hours between doses as needed for migraine headache 02/17/21   [provider]  torsemide (DEMADEX) 20 MG tablet Take 1 tablet (20 mg total) by mouth daily as needed. May take an  additional 20 mg daily as needed for extreme swelling. 04/17/21   Verta Ellen., NP    Physical Exam: Vitals:   01/02/22 1030 01/02/22 1120 01/02/22 1200 01/02/22 1435  BP: (!) 107/94 (!) 159/104 (!) 159/117 (!) 152/72  Pulse: (!) 123 (!) 121 (!) 121 (!) 110  Resp: (!) 23 (!) 29 (!) 24 (!) 22  Temp:      TempSrc:      SpO2: 95% 96% 95% 98%  Weight:      Height:       General: Patient hypersomnolent and minimally responsive due to medication effect.  No acute cardiopulmonary distress.  HEENT: Normocephalic atraumatic.  Right and left ears normal in appearance.  Pupils equal, round, reactive to light. Extraocular muscles are intact. Sclerae anicteric and noninjected.  Moist mucosal membranes. No mucosal lesions.  Neck: Neck supple without lymphadenopathy. No carotid bruits. No masses palpated.  Cardiovascular: Regular rate with normal S1-S2 sounds. No murmurs, rubs, gallops auscultated. No JVD.  Respiratory: Coarse wheezing throughout.  No accessory muscle use. Abdomen: Soft, nontender, nondistended. Active bowel sounds. No masses or hepatosplenomegaly  Skin: No rashes, lesions, or ulcerations.  Dry, warm to touch. 2+ dorsalis pedis and radial pulses. Musculoskeletal: No calf or leg pain. All major joints not erythematous nontender.  No upper or lower joint deformation.  Good ROM.  No contractures  Psychiatric: Unable to assess Neurologic: Unable to assess  Data Reviewed: Results for orders placed or performed during the hospital encounter of 01/02/22 (from the past 24  hour(s))  CBC with Differential/Platelet     Status: Abnormal   Collection Time: 01/02/22  4:51 AM  Result Value Ref Range   WBC 26.0 (H) 4.0 - 10.5 K/uL   RBC 3.84 (L) 3.87 - 5.11 MIL/uL   Hemoglobin 12.3 12.0 - 15.0 g/dL   HCT 35.6 (L) 36.0 - 46.0 %   MCV 92.7 80.0 - 100.0 fL   MCH 32.0 26.0 - 34.0 pg   MCHC 34.6 30.0 - 36.0 g/dL   RDW 12.5 11.5 - 15.5 %   Platelets 339 150 - 400 K/uL   nRBC 0.0 0.0 - 0.2 %   Neutrophils Relative % 81 %   Neutro Abs 20.9 (H) 1.7 - 7.7 K/uL   Lymphocytes Relative 9 %   Lymphs Abs 2.4 0.7 - 4.0 K/uL   Monocytes Relative 7 %   Monocytes Absolute 1.9 (H) 0.1 - 1.0 K/uL   Eosinophils Relative 0 %   Eosinophils Absolute 0.0 0.0 - 0.5 K/uL   Basophils Relative 0 %   Basophils Absolute 0.1 0.0 - 0.1 K/uL   WBC Morphology VACUOLATED NEUTROPHILS    RBC Morphology MORPHOLOGY UNREMARKABLE    Smear Review MORPHOLOGY UNREMARKABLE    Immature Granulocytes 3 %   Abs Immature Granulocytes 0.65 (H) 0.00 - 0.07 K/uL  Comprehensive metabolic panel     Status: Abnormal   Collection Time: 01/02/22  4:51 AM  Result Value Ref Range   Sodium 125 (L) 135 - 145 mmol/L   Potassium 4.3 3.5 - 5.1 mmol/L   Chloride 88 (L) 98 - 111 mmol/L   CO2 24 22 - 32 mmol/L   Glucose, Bld 162 (H) 70 - 99 mg/dL   BUN 13 8 - 23 mg/dL   Creatinine, Ser 0.64 0.44 - 1.00 mg/dL   Calcium 10.6 (H) 8.9 - 10.3 mg/dL   Total Protein 7.5 6.5 - 8.1 g/dL   Albumin 4.0 3.5 - 5.0 g/dL   AST  32 15 - 41 U/L   ALT 27 0 - 44 U/L   Alkaline Phosphatase 75 38 - 126 U/L   Total Bilirubin 0.3 0.3 - 1.2 mg/dL   GFR, Estimated >60 >60 mL/min   Anion gap 13 5 - 15  Troponin I (High Sensitivity)     Status: Abnormal   Collection Time: 01/02/22  4:51 AM  Result Value Ref Range   Troponin I (High Sensitivity) 100 (HH) <18 ng/L  CK     Status: Abnormal   Collection Time: 01/02/22  4:51 AM  Result Value Ref Range   Total CK 289 (H) 38 - 234 U/L  CBG monitoring, ED     Status: Abnormal   Collection  Time: 01/02/22  5:09 AM  Result Value Ref Range   Glucose-Capillary 166 (H) 70 - 99 mg/dL  Acetaminophen level     Status: Abnormal   Collection Time: 01/02/22  6:26 AM  Result Value Ref Range   Acetaminophen (Tylenol), Serum <10 (L) 10 - 30 ug/mL  Ethanol     Status: Abnormal   Collection Time: 01/02/22  6:26 AM  Result Value Ref Range   Alcohol, Ethyl (B) 13 (H) <71 mg/dL  Salicylate level     Status: Abnormal   Collection Time: 01/02/22  6:26 AM  Result Value Ref Range   Salicylate Lvl <6.9 (L) 7.0 - 30.0 mg/dL  Lactic acid, plasma     Status: Abnormal   Collection Time: 01/02/22  6:26 AM  Result Value Ref Range   Lactic Acid, Venous 2.1 (HH) 0.5 - 1.9 mmol/L  Rapid urine drug screen (hospital performed)     Status: Abnormal   Collection Time: 01/02/22  6:34 AM  Result Value Ref Range   Opiates NONE DETECTED NONE DETECTED   Cocaine POSITIVE (A) NONE DETECTED   Benzodiazepines POSITIVE (A) NONE DETECTED   Amphetamines NONE DETECTED NONE DETECTED   Tetrahydrocannabinol NONE DETECTED NONE DETECTED   Barbiturates NONE DETECTED NONE DETECTED  Urinalysis, Routine w reflex microscopic Urine, Catheterized     Status: Abnormal   Collection Time: 01/02/22  6:34 AM  Result Value Ref Range   Color, Urine YELLOW YELLOW   APPearance CLEAR CLEAR   Specific Gravity, Urine 1.014 1.005 - 1.030   pH 5.0 5.0 - 8.0   Glucose, UA 150 (A) NEGATIVE mg/dL   Hgb urine dipstick SMALL (A) NEGATIVE   Bilirubin Urine NEGATIVE NEGATIVE   Ketones, ur NEGATIVE NEGATIVE mg/dL   Protein, ur 100 (A) NEGATIVE mg/dL   Nitrite NEGATIVE NEGATIVE   Leukocytes,Ua NEGATIVE NEGATIVE   WBC, UA 0-5 0 - 5 WBC/hpf   Bacteria, UA NONE SEEN NONE SEEN   CT Head Wo Contrast  Result Date: 01/02/2022 CLINICAL DATA:  Mental status change. Unknown cause. History of illicit drug use. EXAM: CT HEAD WITHOUT CONTRAST TECHNIQUE: Contiguous axial images were obtained from the base of the skull through the vertex without  intravenous contrast. RADIATION DOSE REDUCTION: This exam was performed according to the departmental dose-optimization program which includes automated exposure control, adjustment of the mA and/or kV according to patient size and/or use of iterative reconstruction technique. COMPARISON:  Head CT 07/15/2014. FINDINGS: Despite efforts by the technologist and patient, moderate to severe motion artifact is present on today's exam and could not be eliminated. This reduces exam sensitivity and specificity. Multiple images were repeated. Brain: There is no evidence of acute intracranial hemorrhage, mass lesion, brain edema or extra-axial fluid collection. The ventricles and subarachnoid spaces  are appropriately sized for age. There is no CT evidence of acute cortical infarction. Stable mild chronic low-density in the left frontal periventricular white matter. Vascular: Intracranial vascular calcifications. No hyperdense vessel identified. Skull: Negative for fracture or focal lesion. Sinuses/Orbits: Possible small fluid level in the right division of the sphenoid sinus. The additional paranasal sinuses, mastoid air cells and middle ears are clear. No orbital abnormalities are seen. Other: None. IMPRESSION: 1. Significantly motion degraded examination demonstrates no definite acute intracranial findings. 2. Possible small fluid level within the right sphenoid sinus. No acute osseous findings identified. Electronically Signed   By: Richardean Sale M.D.   On: 01/02/2022 14:28   DG Chest Port 1 View  Result Date: 01/02/2022 CLINICAL DATA:  66 year old female with shortness of breath and altered mental status. EXAM: PORTABLE CHEST 1 VIEW COMPARISON:  Chest radiographs 04/29/2021 and earlier. FINDINGS: Portable AP semi upright view at 0754 hours. Stable lung volumes, mediastinal contours remain within normal limits. Visualized tracheal air column is within normal limits. Chronic coarse bilateral pulmonary interstitial  opacity appears stable. No superimposed pneumothorax, pulmonary edema, or acute pulmonary opacity. No acute osseous abnormality identified. IMPRESSION: Chronic pulmonary interstitial disease. No acute cardiopulmonary abnormality. Electronically Signed   By: Genevie Ann M.D.   On: 01/02/2022 08:22     Assessment and Plan: No notes have been filed under this hospital service. Service: Hospitalist  Principal Problem:   Delirium Active Problems:   Benzodiazepine dependence (HCC)   Essential hypertension   COPD exacerbation (HCC)   Polysubstance abuse (HCC)   Obesity (BMI 30-39.9)   DM (diabetes mellitus), type 2 (HCC)   Chronic hypoxemic respiratory failure (HCC)   Hyponatremia   Elevated troponin   Leukocytosis  Delirium Admit to stepdown CIWA protocol Supportive care Haldol as needed agitation Likely secondary to acute cocaine intoxication Hyponatremia Recheck BMP now and at 10:00, as well as tomorrow morning. Rehydrate with normal saline Check urine studies Leukocytosis Possibly secondary to acute cocaine use. No obvious source of infection and the patient has not demonstrated a fever since being here. As the patient is stable, will check blood cultures and watch closely.  If she begins to develop a fever or other signs of acute infection, will start broad-spectrum antibiotics Elevated Troponin Likely secondary to cocaine use Repeat troponin Polysubstance abuse CIWA protocol and supportive care at this point COPD exacerbation Antibiotics: azithromycin DuoNeb's every 6 scheduled with albuterol every 2 when necessary Continue inhaled steroids and LA bronchodilator Solu-Medrol 125mg  and 60 mg IV every 12 hours Mucinex HTN Hydralyzine prn Chronic respiratory failure Continue home O2 DM2 CBGs AC and qHS SSI   Advance Care Planning:   Code Status: Prior full  Consults: none  Family Communication: none  Severity of Illness: The appropriate patient status for this  patient is INPATIENT. Inpatient status is judged to be reasonable and necessary in order to provide the required intensity of service to ensure the patient's safety. The patient's presenting symptoms, physical exam findings, and initial radiographic and laboratory data in the context of their chronic comorbidities is felt to place them at high risk for further clinical deterioration. Furthermore, it is not anticipated that the patient will be medically stable for discharge from the hospital within 2 midnights of admission.   * I certify that at the point of admission it is my clinical judgment that the patient will require inpatient hospital care spanning beyond 2 midnights from the point of admission due to high intensity of service, high  risk for further deterioration and high frequency of surveillance required.*  Author: Truett Mainland, DO 01/02/2022 4:50 PM  For on call review [email or web address].

## 2022-01-03 ENCOUNTER — Inpatient Hospital Stay (HOSPITAL_COMMUNITY): Payer: Medicare Other

## 2022-01-03 DIAGNOSIS — I214 Non-ST elevation (NSTEMI) myocardial infarction: Secondary | ICD-10-CM | POA: Diagnosis not present

## 2022-01-03 DIAGNOSIS — R778 Other specified abnormalities of plasma proteins: Secondary | ICD-10-CM

## 2022-01-03 DIAGNOSIS — F1910 Other psychoactive substance abuse, uncomplicated: Secondary | ICD-10-CM | POA: Diagnosis not present

## 2022-01-03 DIAGNOSIS — L8990 Pressure ulcer of unspecified site, unspecified stage: Secondary | ICD-10-CM | POA: Insufficient documentation

## 2022-01-03 DIAGNOSIS — R410 Disorientation, unspecified: Secondary | ICD-10-CM | POA: Diagnosis not present

## 2022-01-03 LAB — BASIC METABOLIC PANEL
Anion gap: 13 (ref 5–15)
Anion gap: 11 (ref 5–15)
Anion gap: 13 (ref 5–15)
BUN: 15 mg/dL (ref 8–23)
BUN: 11 mg/dL (ref 8–23)
BUN: 12 mg/dL (ref 8–23)
CO2: 24 mmol/L (ref 22–32)
CO2: 24 mmol/L (ref 22–32)
CO2: 23 mmol/L (ref 22–32)
Calcium: 10 mg/dL (ref 8.9–10.3)
Calcium: 9.6 mg/dL (ref 8.9–10.3)
Calcium: 9.6 mg/dL (ref 8.9–10.3)
Chloride: 92 mmol/L — ABNORMAL LOW (ref 98–111)
Chloride: 96 mmol/L — ABNORMAL LOW (ref 98–111)
Chloride: 98 mmol/L (ref 98–111)
Creatinine, Ser: 0.59 mg/dL (ref 0.44–1.00)
Creatinine, Ser: 0.83 mg/dL (ref 0.44–1.00)
Creatinine, Ser: 0.7 mg/dL (ref 0.44–1.00)
GFR, Estimated: >60 mL/min (ref >60)
GFR, Estimated: >60 mL/min (ref >60)
GFR, Estimated: >60 mL/min (ref >60)
Glucose, Bld: 199 mg/dL — ABNORMAL HIGH (ref 70–99)
Glucose, Bld: 167 mg/dL — ABNORMAL HIGH (ref 70–99)
Glucose, Bld: 174 mg/dL — ABNORMAL HIGH (ref 70–99)
Potassium: 4.4 mmol/L (ref 3.5–5.1)
Potassium: 4.2 mmol/L (ref 3.5–5.1)
Potassium: 4.3 mmol/L (ref 3.5–5.1)
Sodium: 129 mmol/L — ABNORMAL LOW (ref 135–145)
Sodium: 131 mmol/L — ABNORMAL LOW (ref 135–145)
Sodium: 134 mmol/L — ABNORMAL LOW (ref 135–145)

## 2022-01-03 LAB — ECHOCARDIOGRAM COMPLETE
Height: 64 in
S' Lateral: 2.3 cm
Single Plane A4C EF: 44.3 %
Weight: 3597.91 oz

## 2022-01-03 LAB — CBC
HCT: 33.3 % — ABNORMAL LOW (ref 36.0–46.0)
HCT: 33.7 % — ABNORMAL LOW (ref 36.0–46.0)
Hemoglobin: 11.7 g/dL — ABNORMAL LOW (ref 12.0–15.0)
Hemoglobin: 11.2 g/dL — ABNORMAL LOW (ref 12.0–15.0)
MCH: 32.5 pg (ref 26.0–34.0)
MCH: 31.4 pg (ref 26.0–34.0)
MCHC: 35.1 g/dL (ref 30.0–36.0)
MCHC: 33.2 g/dL (ref 30.0–36.0)
MCV: 92.5 fL (ref 80.0–100.0)
MCV: 94.4 fL (ref 80.0–100.0)
Platelets: 250 10*3/uL (ref 150–400)
Platelets: 250 10*3/uL (ref 150–400)
RBC: 3.6 MIL/uL — ABNORMAL LOW (ref 3.87–5.11)
RBC: 3.57 MIL/uL — ABNORMAL LOW (ref 3.87–5.11)
RDW: 12.3 % (ref 11.5–15.5)
RDW: 12.5 % (ref 11.5–15.5)
WBC: 20.1 10*3/uL — ABNORMAL HIGH (ref 4.0–10.5)
WBC: 18.4 10*3/uL — ABNORMAL HIGH (ref 4.0–10.5)
nRBC: 0 % (ref 0.0–0.2)
nRBC: 0 % (ref 0.0–0.2)

## 2022-01-03 LAB — CBG MONITORING, ED: Glucose-Capillary: 216 mg/dL — ABNORMAL HIGH (ref 70–99)

## 2022-01-03 LAB — BLOOD GAS, ARTERIAL
Acid-Base Excess: 0.5 mmol/L (ref 0.0–2.0)
Bicarbonate: 26 mmol/L (ref 20.0–28.0)
Drawn by: 59133
O2 Saturation: 97.4 %
Patient temperature: 37
pCO2 arterial: 44 mmHg (ref 32–48)
pH, Arterial: 7.38 (ref 7.35–7.45)
pO2, Arterial: 83 mmHg (ref 83–108)

## 2022-01-03 LAB — CK
Total CK: 371 U/L — ABNORMAL HIGH (ref 38–234)
Total CK: 260 U/L — ABNORMAL HIGH (ref 38–234)

## 2022-01-03 LAB — HIV ANTIBODY (ROUTINE TESTING W REFLEX): HIV Screen 4th Generation wRfx: NONREACTIVE

## 2022-01-03 LAB — HEPARIN LEVEL (UNFRACTIONATED)
Heparin Unfractionated: 0.25 IU/mL — ABNORMAL LOW (ref 0.30–0.70)
Heparin Unfractionated: 0.34 IU/mL (ref 0.30–0.70)

## 2022-01-03 LAB — GLUCOSE, CAPILLARY
Glucose-Capillary: 166 mg/dL — ABNORMAL HIGH (ref 70–99)
Glucose-Capillary: 180 mg/dL — ABNORMAL HIGH (ref 70–99)
Glucose-Capillary: 180 mg/dL — ABNORMAL HIGH (ref 70–99)
Glucose-Capillary: 209 mg/dL — ABNORMAL HIGH (ref 70–99)
Glucose-Capillary: 179 mg/dL — ABNORMAL HIGH (ref 70–99)
Glucose-Capillary: 157 mg/dL — ABNORMAL HIGH (ref 70–99)

## 2022-01-03 LAB — TROPONIN I (HIGH SENSITIVITY)
Troponin I (High Sensitivity): 2037 ng/L (ref <18)
Troponin I (High Sensitivity): 2311 ng/L (ref <18)

## 2022-01-03 LAB — BRAIN NATRIURETIC PEPTIDE: B Natriuretic Peptide: 335.6 pg/mL — ABNORMAL HIGH (ref 0.0–100.0)

## 2022-01-03 MED ORDER — LEVALBUTEROL HCL 0.63 MG/3ML IN NEBU
INHALATION_SOLUTION | RESPIRATORY_TRACT | Status: AC
  Administered 2022-01-03: 0.63 mg
  Filled 2022-01-03: qty 3

## 2022-01-03 MED ORDER — ARFORMOTEROL TARTRATE 15 MCG/2ML IN NEBU
15.0000 ug | INHALATION_SOLUTION | Freq: Two times a day (BID) | RESPIRATORY_TRACT | Status: DC
  Administered 2022-01-03 – 2022-01-06 (×6): 15 ug via RESPIRATORY_TRACT
  Filled 2022-01-03 (×8): qty 2

## 2022-01-03 MED ORDER — NALOXONE HCL 0.4 MG/ML IJ SOLN: 0.4000 mg | INTRAMUSCULAR | Status: DC | PRN

## 2022-01-03 MED ORDER — NALOXONE HCL 0.4 MG/ML IJ SOLN
0.4000 mg | Freq: Once | INTRAMUSCULAR | Status: DC
  Filled 2022-01-03: qty 1

## 2022-01-03 MED ORDER — CHLORHEXIDINE GLUCONATE CLOTH 2 % EX PADS
6.0000 | MEDICATED_PAD | Freq: Every day | CUTANEOUS | Status: DC
Start: 2022-01-03 — End: 2022-01-05
  Administered 2022-01-03 – 2022-01-04 (×2): 6 via TOPICAL

## 2022-01-03 MED ORDER — REVEFENACIN 175 MCG/3ML IN SOLN
175.0000 ug | Freq: Every day | RESPIRATORY_TRACT | Status: DC
  Administered 2022-01-03 – 2022-01-06 (×4): 175 ug via RESPIRATORY_TRACT
  Filled 2022-01-03 (×5): qty 3

## 2022-01-03 MED ORDER — BUDESONIDE 0.25 MG/2ML IN SUSP
0.2500 mg | Freq: Two times a day (BID) | RESPIRATORY_TRACT | Status: DC
  Administered 2022-01-03 – 2022-01-06 (×6): 0.25 mg via RESPIRATORY_TRACT
  Filled 2022-01-03 (×6): qty 2

## 2022-01-03 MED ORDER — FUROSEMIDE 10 MG/ML IJ SOLN
20.0000 mg | Freq: Once | INTRAMUSCULAR | Status: AC
  Administered 2022-01-03: 20 mg via INTRAVENOUS
  Filled 2022-01-03: qty 2
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
## 2022-01-03 NOTE — Social Work (Signed)
CSW acknowledge consult for substance abuse counseling/education- per chart review, patient has been lethargic. CSW will address consult at more appropriate time.   Thurmond Butts, MSW, LCSW Clinical Social Worker

## 2022-01-03 NOTE — Progress Notes (Signed)
-  Patient admitted to 4E27 via [facility] from [facility]   Patient only answers to her name and where she is.  On O2 @ 3l/min.  Has NSL in right thumb and PIV in Left upper forearm.  Placed on monitor CHG bath given.  Has bruising and redness on extremities bilat   Has abrasion to back of left leg - Mepilex applied

## 2022-01-03 NOTE — Progress Notes (Addendum)
HOSPITAL MEDICINE OVERNIGHT EVENT NOTE    Nursing reports that since patient has arrived on the floor pain she has appeared tachypneic with labored breathing.  Patient also appears to be lethargic.  Chart reviewed, patient currently hospitalized for encephalopathy thought to be secondary to substance abuse according to the admitting providers note.  Considering tachypnea and lethargy in the setting of known COPD will obtain ABG.  Patient is already received 2 chest x-rays since arrival to the [facility] emergency department both of which reveal no evidence of acute cardiopulmonary disease.  We will follow ABG, will monitor closely.  Rebekah Emerald  MD [facility]   ADDENDUM (7/2 5:20am)  ABG revealing normal pH and normal PCO2.  Continue to monitor closely.  Sherryll Burger Xaviera Flaten

## 2022-01-03 NOTE — Progress Notes (Signed)
[place] for heparin Indication: chest pain/ACS Brief A/P: Heparin level subtherapeutic Increase Heparin rate  Allergies  Allergen Reactions   Penicillins Other (See Comments)    Patient is unsure if she allergic to penicillin or septra. Patient states one or another caused "rib pain with a little breathing problem". Has patient had a PCN reaction causing immediate rash, facial/tongue/throat swelling, SOB or lightheadedness with hypotension: YES Has patient had a PCN reaction causing severe rash involving mucus membranes or skin necrosis: NO Has patient had a PCN reaction that required hospitalization: NO Has patient had a PCN reaction occurring within the last 10 years: NO   Septra [Sulfamethoxazole-Trimethoprim] Other (See Comments)    Patient is unsure if she allergic to septra or penicillin. Patient states one or another caused "rib pain with a little breathing problem".    Patient Measurements: Height: 5\' 4"  (162.6 cm) Weight: 102 kg (224 lb 13.9 oz) IBW/kg (Calculated) : 54.7 Heparin Dosing Weight: 78 kg  Vital Signs: Temp: 98.4 F (36.9 C) (07/02 0100) Temp Source: Axillary (07/02 0041) BP: 125/89 (07/02 0114) Pulse Rate: 94 (07/02 0114)  Labs: Recent Labs    01/02/22 0451 01/02/22 1638 01/02/22 2256 01/03/22 0108  HGB 12.3  --   --   --   HCT 35.6*  --   --   --   PLT 339  --   --   --   HEPARINUNFRC  --   --   --  0.25*  CREATININE 0.64 0.65 0.59  --   CKTOTAL 289* 670* 371*  --   TROPONINIHS 100* 2,788* 2,037*  --      Estimated Creatinine Clearance: 80.4 mL/min (by C-G formula based on SCr of 0.59 mg/dL).  Assessment: 66 y.o. female with chest pain for heparin   Goal of Therapy:  Heparin level 0.3-0.7 units/ml Monitor platelets by anticoagulation protocol: Yes   Plan:  Increase Heparin 1300 units/hr Check heparin level in 6 hours.   Phillis Knack, PharmD, BCPS

## 2022-01-03 NOTE — Progress Notes (Signed)
Pt placed on BiPAP by RT. Pt tolerating well at this time, RN aware,RT will monitor.

## 2022-01-03 NOTE — Progress Notes (Signed)
Patient transferred from 4E27 to 2M04 on BiPAP. No complications. CCMD notified of transfer.  All belongings were given to patient's spouse.

## 2022-01-03 NOTE — Progress Notes (Signed)
ANTICOAGULATION CONSULT NOTE - Follow up [place] for heparin Indication: chest pain/ACS  Allergies  Allergen Reactions   Penicillins Other (See Comments)    Patient is unsure if she allergic to penicillin or septra. Patient states one or another caused "rib pain with a little breathing problem". Has patient had a PCN reaction causing immediate rash, facial/tongue/throat swelling, SOB or lightheadedness with hypotension: YES Has patient had a PCN reaction causing severe rash involving mucus membranes or skin necrosis: NO Has patient had a PCN reaction that required hospitalization: NO Has patient had a PCN reaction occurring within the last 10 years: NO   Septra [Sulfamethoxazole-Trimethoprim] Other (See Comments)    Patient is unsure if she allergic to septra or penicillin. Patient states one or another caused "rib pain with a little breathing problem".    Patient Measurements: Height: 5\' 4"  (162.6 cm) Weight: 102 kg (224 lb 13.9 oz) IBW/kg (Calculated) : 54.7 Heparin Dosing Weight: 78 kg  Vital Signs: Temp: 99 F (37.2 C) (07/02 0818) Temp Source: Axillary (07/02 0818) BP: 125/75 (07/02 0818) Pulse Rate: 109 (07/02 0818)  Labs: Recent Labs    01/02/22 0451 01/02/22 1638 01/02/22 2256 01/03/22 0108 01/03/22 0332 01/03/22 0913  HGB 12.3  --   --   --   --  11.2*  HCT 35.6*  --   --   --   --  33.7*  PLT 339  --   --   --   --  250  HEPARINUNFRC  --   --   --  0.25*  --  0.34  CREATININE 0.64 0.65 0.59  --   --  0.70  CKTOTAL 289* 670* 371*  --   --   --   TROPONINIHS 100* 2,788* 2,037*  --  2,311*  --      Estimated Creatinine Clearance: 80.4 mL/min (by C-G formula based on SCr of 0.7 mg/dL).   Medical History: Past Medical History:  Diagnosis Date   Anemia    Anxiety    Arthritis    Bipolar 1 disorder (HCC)    CHF (congestive heart failure) (HCC)    COPD (chronic obstructive pulmonary disease) (HCC)    Depression    Diabetes mellitus  without complication (HCC)    Dyspnea    Emphysema of lung (HCC)    GERD (gastroesophageal reflux disease)    Headache    migraines   History of hiatal hernia    History of kidney stones    Hyperlipidemia    Hypertension    Neuromuscular disorder (HCC)    neuropathy   On home oxygen therapy    Pneumonia 2015, 2019   Tracheomalacia    Vaginal Pap smear, abnormal     Medications:  Medications Prior to Admission  Medication Sig Dispense Refill Last Dose   acetaminophen (TYLENOL) 500 MG tablet Take 1 tablet (500 mg total) by mouth every 6 (six) hours as needed. 30 tablet 0 UNK   albuterol (PROVENTIL) (2.5 MG/3ML) 0.083% nebulizer solution INHALE 1 VIAL VIA NEBULIZER EVERY 4 HOURS (Patient taking differently: Take 2.5 mg by nebulization every 4 (four) hours as needed for wheezing or shortness of breath.) 360 mL 2 UNK   albuterol (VENTOLIN HFA) 108 (90 Base) MCG/ACT inhaler INHALE 1 OR 2 PUFFS BY MOUTH EVERY 6 HOURS AS NEEDED FOR WHEEZING OR SHORTNESS OF BREATH (Patient taking differently: Inhale 2 puffs into the lungs every 6 (six) hours as needed for wheezing or shortness of breath.) 8.5  g 5 UNK   BREZTRI AEROSPHERE 160-9-4.8 MCG/ACT AERO INHALE TWO PUFFS INTO THE LUNGS IN THE MORNING ANDAT BEDTIME. (Patient taking differently: Inhale 2 puffs into the lungs 2 (two) times daily.) 10.7 g 0 UNK   cetirizine (ZYRTEC) 10 MG tablet Take 10 mg by mouth daily as needed for allergies.    UNK   cyclobenzaprine (FLEXERIL) 10 MG tablet Take 10 mg by mouth 3 (three) times daily as needed for muscle spasms.   UNK   DULoxetine (CYMBALTA) 30 MG capsule Take 1 capsule by mouth daily.   UNK   FLUoxetine (PROZAC) 20 MG capsule Take 20 mg by mouth daily.   UNK   gabapentin (NEURONTIN) 800 MG tablet Take 800 mg by mouth 3 (three) times daily.   UNK   hydrOXYzine (VISTARIL) 50 MG capsule TAKE 1 CAPSULE BY MOUTH THREE TIMES DAILY AS NEEDED FOR ANXIETY (Patient taking differently: Take 50 mg by mouth 3 (three)  times daily as needed for anxiety.) 90 capsule 0 UNK   ipratropium (ATROVENT) 0.02 % nebulizer solution Take 2.5 mLs by nebulization every 4 (four) hours as needed for wheezing or shortness of breath.   UNK   Ipratropium-Albuterol (COMBIVENT RESPIMAT) 20-100 MCG/ACT AERS respimat 1 puff as needed   UNK   meloxicam (MOBIC) 15 MG tablet Take 15 mg by mouth daily.   UNK   metFORMIN (GLUCOPHAGE) 1000 MG tablet Take 1,000 mg by mouth 2 (two) times daily.   UNK   nitroGLYCERIN (NITROSTAT) 0.4 MG SL tablet Place under the tongue.   UNK   olmesartan-hydrochlorothiazide (BENICAR HCT) 20-12.5 MG tablet Take 1 tablet by mouth daily. 30 tablet 11 UNK   omeprazole (PRILOSEC) 20 MG capsule Take 20 mg by mouth daily.   UNK   ondansetron (ZOFRAN-ODT) 4 MG disintegrating tablet Take 4 mg by mouth 2 (two) times daily as needed for nausea/vomiting.   UNK   oxybutynin (DITROPAN-XL) 5 MG 24 hr tablet Take 5 mg by mouth daily.   UNK   predniSONE (DELTASONE) 20 MG tablet Take 1 tablet (20 mg total) by mouth daily with breakfast. 30 tablet 0 UNK   promethazine (PHENERGAN) 25 MG tablet Take 25 mg by mouth 3 (three) times daily as needed for nausea/vomiting.   UNK   rOPINIRole (REQUIP) 1 MG tablet Take 1 mg by mouth 3 (three) times daily.    UNK   simvastatin (ZOCOR) 20 MG tablet Take 20 mg by mouth daily.   UNK   SUMAtriptan (IMITREX) 25 MG tablet Take 25 mg by mouth every 2 (two) hours as needed for migraine.   UNK   benzonatate (TESSALON) 200 MG capsule Take 1 capsule (200 mg total) by mouth 3 (three) times daily as needed for cough. (Patient not taking: Reported on 01/03/2022) 30 capsule 1 Not Taking   doxycycline (VIBRA-TABS) 100 MG tablet Take 1 tablet (100 mg total) by mouth 2 (two) times daily. (Patient not taking: Reported on 01/03/2022) 14 tablet 0 Not Taking   olmesartan (BENICAR) 20 MG tablet Take 1 tablet (20 mg total) by mouth daily. 30 tablet 11 UNK   predniSONE (DELTASONE) 10 MG tablet Take 2 tablets daily until  better then 1 tablet daily (Patient not taking: Reported on 01/03/2022) 30 tablet 0 Not Taking   torsemide (DEMADEX) 20 MG tablet Take 1 tablet (20 mg total) by mouth daily as needed. May take an additional 20 mg daily as needed for extreme swelling. (Patient not taking: Reported on 01/03/2022) 90 tablet 1  Not Taking    Assessment: Pharmacy consulted to dose heparin in patient with chest pain/ACS.  Patient is not on anticoagulation prior to admission.  CBC stable no bleeding noted  Trop 2,788 Heparin drip 1300 uts/hr heparin level 0.34 at goal   Goal of Therapy:  Heparin level 0.3-0.7 units/ml Monitor platelets by anticoagulation protocol: Yes   Plan:  Continue heparin drip 1300 uts/hr  Daily heparin level  and cbc  Monitor s/s bleeding    Bonnita Nasuti Pharm.D. CPP, BCPS Clinical Pharmacist [phone] 01/03/2022 11:03 AM

## 2022-01-03 NOTE — Progress Notes (Signed)
ABG collected and send down to lab for analysis. Lab notified.

## 2022-01-03 NOTE — Progress Notes (Signed)
Transferred patient from 4E27 to 2M04 on BiPAP without complications

## 2022-01-03 NOTE — Progress Notes (Signed)
  Echocardiogram 2D Echocardiogram has been performed.  [name] 01/03/2022, 6:32 PM

## 2022-01-03 NOTE — Consult Note (Addendum)
NAME:  [name], MRN:  [id number], DOB:  1955-12-23, LOS: 1 ADMISSION DATE:  01/02/2022, CONSULTATION DATE:  01/03/2022 REFERRING MD:  Dr. Tawanna Solo, CHIEF COMPLAINT:  BIPAP, Cocaine    History of Present Illness:   66 yo FM, PMH DMII, CHF, Bipolar disease, chronic hypoxemic respiratory failure COPD, on 2L HOT for the past two years, smokes 2 packs per day and occasionally smokes crack cocaine. The patient presents in acute respiratory distress and concern for delirium to the hospital. She as treated with abx, steroids. Troponins were elevated likely related to demand ischemia. CXR was complete with vascular congestion and pulmonary edema. She was somnolent and placed on bipap. Pccm was consulted for icu admission   Pertinent  Medical History   Past Medical History:  Diagnosis Date   Anemia    Anxiety    Arthritis    Bipolar 1 disorder (HCC)    CHF (congestive heart failure) (HCC)    COPD (chronic obstructive pulmonary disease) (HCC)    Depression    Diabetes mellitus without complication (HCC)    Dyspnea    Emphysema of lung (HCC)    GERD (gastroesophageal reflux disease)    Headache    migraines   History of hiatal hernia    History of kidney stones    Hyperlipidemia    Hypertension    Neuromuscular disorder (HCC)    neuropathy   On home oxygen therapy    Pneumonia 2015, 2019   Tracheomalacia    Vaginal Pap smear, abnormal      Significant Hospital Events: Including procedures, antibiotic start and stop dates in addition to other pertinent events     Interim History / Subjective:  Sleepy, but arousable. On bipap   Objective   Blood pressure 125/75, pulse (!) 116, temperature 99.3 F (37.4 C), temperature source Axillary, resp. rate (!) 31, height 5\' 4"  (1.626 m), weight 102 kg, SpO2 95 %.    FiO2 (%):  [40 %] 40 %   Intake/Output Summary (Last 24 hours) at 01/03/2022 1527 Last data filed at 01/03/2022 0040 Gross per 24 hour  Intake 250 ml  Output --   Net 250 ml   Filed Weights   01/02/22 0501  Weight: 102 kg    Examination: General: appears older than stated age, on bipap, chronically ill  HENT: ncat, poor dentition  Lungs: crackles, and BL wheezing  Cardiovascular: RRR, s1 s2  Abdomen: obese, soft nt nd  Extremities: no edema  Neuro: follows commands no focal deficit  GU: deferred   Resolved Hospital Problem list     Assessment & Plan:   Acute on Chronic Hypoxemic respiratory failure, now BIPAP dependent  AECOPD Cocaine abuse  Tobacco abuse  Polysubstance abuse  Elevated troponin, demand cardiac ischemia  Acute metabolic encephalopathy related to above History of alcohol use   P: Admit to ICU  Remain on BIPAP, lets try to avoid intubation  Triple therapy nebs  Solumedrol 60mg  q12h  Prn SABA  Thiamine, MVT  Heparin infusion, appreciate cardiology recs  Prn ativan   Best Practice (right click and "Reselect all SmartList Selections" daily)   Diet/type: NPO DVT prophylaxis: systemic heparin GI prophylaxis: N/A Lines: N/A Foley:  N/A Code Status:  full code Last date of multidisciplinary goals of care discussion [husband updated at bedside]  Labs   CBC: Recent Labs  Lab 01/02/22 0451 01/03/22 0913  WBC 26.0* 18.4*  NEUTROABS 20.9*  --   HGB 12.3 11.2*  HCT 35.6* 33.7*  MCV 92.7 94.4  PLT 339 176    Basic Metabolic Panel: Recent Labs  Lab 01/02/22 0451 01/02/22 1638 01/02/22 2256 01/03/22 0913  NA 125* 127* 129* 134*  K 4.3 4.3 4.4 4.3  CL 88* 90* 92* 98  CO2 24 27 24 23   GLUCOSE 162* 174* 199* 174*  BUN 13 14 15 12   CREATININE 0.64 0.65 0.59 0.70  CALCIUM 10.6* 10.0 10.0 9.6   GFR: Estimated Creatinine Clearance: 80.4 mL/min (by C-G formula based on SCr of 0.7 mg/dL). Recent Labs  Lab 01/02/22 0451 01/02/22 0626 01/02/22 1638 01/03/22 0913  WBC 26.0*  --   --  18.4*  LATICACIDVEN  --  2.1* 0.9  --     Liver Function Tests: Recent Labs  Lab 01/02/22 0451  AST 32  ALT  27  ALKPHOS 75  BILITOT 0.3  PROT 7.5  ALBUMIN 4.0   No results for input(s): "LIPASE", "AMYLASE" in the last 168 hours. No results for input(s): "AMMONIA" in the last 168 hours.  ABG    Component Value Date/Time   PHART 7.38 01/03/2022 0446   PCO2ART 44 01/03/2022 0446   PO2ART 83 01/03/2022 0446   HCO3 26.0 01/03/2022 0446   TCO2 20.7 07/05/2014 1122   ACIDBASEDEF 0.7 07/05/2014 1122   O2SAT 97.4 01/03/2022 0446     Coagulation Profile: No results for input(s): "INR", "PROTIME" in the last 168 hours.  Cardiac Enzymes: Recent Labs  Lab 01/02/22 0451 01/02/22 1638 01/02/22 2256  CKTOTAL 289* 670* 371*    HbA1C: Hgb A1c MFr Bld  Date/Time Value Ref Range Status  04/04/2021 12:49 AM 6.3 (H) 4.8 - 5.6 % Final    Comment:    (NOTE) Pre diabetes:          5.7%-6.4%  Diabetes:              >6.4%  Glycemic control for   <7.0% adults with diabetes   01/11/2020 10:25 AM 6.4 (H) 4.8 - 5.6 % Final    Comment:    (NOTE) Pre diabetes:          5.7%-6.4%  Diabetes:              >6.4%  Glycemic control for   <7.0% adults with diabetes     CBG: Recent Labs  Lab 01/02/22 0509 01/03/22 0033 01/03/22 0515 01/03/22 0815 01/03/22 1221  GLUCAP 166* 216* 166* 180* 180*    Review of Systems:    Unable to be obtained Critically ill on BIPAP  Past Medical History:  She,  has a past medical history of Anemia, Anxiety, Arthritis, Bipolar 1 disorder ([place]), CHF (congestive heart failure) ([place]), COPD (chronic obstructive pulmonary disease) ([place]), Depression, Diabetes mellitus without complication ([place]), Dyspnea, Emphysema of lung ([place]), GERD (gastroesophageal reflux disease), Headache, History of hiatal hernia, History of kidney stones, Hyperlipidemia, Hypertension, Neuromuscular disorder ([place]), On home oxygen therapy, Pneumonia (2015, 2019), Tracheomalacia, and Vaginal Pap smear, abnormal.   Surgical History:   Past Surgical History:  Procedure Laterality Date    CATARACT EXTRACTION W/PHACO Left 01/14/2020   Procedure: CATARACT EXTRACTION PHACO AND INTRAOCULAR LENS PLACEMENT LEFT EYE;  Surgeon: Baruch Goldmann, MD;  Location: [facility];  Service: Ophthalmology;  Laterality: Left;  CDE: 8.18   CATARACT EXTRACTION W/PHACO Right 02/01/2020   Procedure: CATARACT EXTRACTION PHACO AND INTRAOCULAR LENS PLACEMENT RIGHT EYE;  Surgeon: Baruch Goldmann, MD;  Location: [facility];  Service: Ophthalmology;  Laterality: Right;  CDE: 9.94   CHEILECTOMY Right 01/28/2020  Procedure: KELLER BUNION IMPLANT RIGHT FOOT CHEILECTOMY RIGHT ROOT;  Surgeon: Landis Martins, DPM;  Location: [facility];  Service: Podiatry;  Laterality: Right;  BLOCK   CHOLECYSTECTOMY     COLONOSCOPY  July 2010   Dr. Arnoldo Morale: 3 rectal polyps, not enough tissue for pathologic examination, recommended surveillance in 3 years   COLONOSCOPY N/A 10/23/2014   RMR: Multiple colonic polyps removed as described above. No endoscopic explaniation for abdominal pain. however. next tcs 10/2019   ESOPHAGOGASTRODUODENOSCOPY  July 2010   Dr. Arnoldo Morale: gastritis and duodenitis, H.pylori negative   ESOPHAGOGASTRODUODENOSCOPY N/A 10/23/2014   RMR: Normal EGD. Status post passage of a Maloney dilator. Today's finding s would not explain abdominal pain   EYE SURGERY Left 01/14/2020   cataract removal   GANGLION CYST EXCISION Left 09/07/2018   Procedure: REMOVAL GANGLION OF WRIST;  Surgeon: Carole Civil, MD;  Location: [facility];  Service: Orthopedics;  Laterality: Left;   HERNIA REPAIR     Dr. Arnoldo Morale   KIDNEY STONE SURGERY     MALONEY DILATION N/A 10/23/2014   Procedure: Venia Minks DILATION;  Surgeon: Daneil Dolin, MD;  Location: [facility];  Service: Endoscopy;  Laterality: N/A;   OPEN REDUCTION INTERNAL FIXATION (ORIF) DISTAL RADIAL FRACTURE Right 12/09/2015   Procedure: OPEN REDUCTION INTERNAL FIXATION (ORIF) RIGHT DISTAL RADIUS;  Surgeon: Leanora Cover, MD;  Location: [facility];  Service: Orthopedics;  Laterality:  Right;     Social History:   reports that she has been smoking cigarettes. She started smoking about 54 years ago. She has a 52.00 pack-year smoking history. She has never used smokeless tobacco. She reports current alcohol use of about 1.0 standard drink of alcohol per week. She reports current drug use. Drug: Cocaine.   Family History:  Her family history is not on file. She was adopted.   Allergies Allergies  Allergen Reactions   Penicillins Other (See Comments)    Patient is unsure if she allergic to penicillin or septra. Patient states one or another caused "rib pain with a little breathing problem". Has patient had a PCN reaction causing immediate rash, facial/tongue/throat swelling, SOB or lightheadedness with hypotension: YES Has patient had a PCN reaction causing severe rash involving mucus membranes or skin necrosis: NO Has patient had a PCN reaction that required hospitalization: NO Has patient had a PCN reaction occurring within the last 10 years: NO   Septra [Sulfamethoxazole-Trimethoprim] Other (See Comments)    Patient is unsure if she allergic to septra or penicillin. Patient states one or another caused "rib pain with a little breathing problem".     Home Medications  Prior to Admission medications   Medication Sig Start Date End Date Taking? Authorizing Provider  acetaminophen (TYLENOL) 500 MG tablet Take 1 tablet (500 mg total) by mouth every 6 (six) hours as needed. 02/26/21  Yes Stover, Titorya, DPM  albuterol (PROVENTIL) (2.5 MG/3ML) 0.083% nebulizer solution INHALE 1 VIAL VIA NEBULIZER EVERY 4 HOURS Patient taking differently: Take 2.5 mg by nebulization every 4 (four) hours as needed for wheezing or shortness of breath. 07/14/21  Yes Tanda Rockers, MD  albuterol (VENTOLIN HFA) 108 (90 Base) MCG/ACT inhaler INHALE 1 OR 2 PUFFS BY MOUTH EVERY 6 HOURS AS NEEDED FOR WHEEZING OR SHORTNESS OF BREATH Patient taking differently: Inhale 2 puffs into the lungs every 6 (six)  hours as needed for wheezing or shortness of breath. 11/05/21  Yes Spero Geralds, MD  BREZTRI AEROSPHERE 160-9-4.8 MCG/ACT AERO INHALE TWO  PUFFS INTO THE LUNGS IN THE MORNING ANDAT BEDTIME. Patient taking differently: Inhale 2 puffs into the lungs 2 (two) times daily. 10/02/21  Yes Tanda Rockers, MD  cetirizine (ZYRTEC) 10 MG tablet Take 10 mg by mouth daily as needed for allergies.  11/02/19  Yes [provider]  cyclobenzaprine (FLEXERIL) 10 MG tablet Take 10 mg by mouth 3 (three) times daily as needed for muscle spasms.   Yes [provider]  DULoxetine (CYMBALTA) 30 MG capsule Take 1 capsule by mouth daily. 04/10/21  Yes [provider]  FLUoxetine (PROZAC) 20 MG capsule Take 20 mg by mouth daily.   Yes [provider]  gabapentin (NEURONTIN) 800 MG tablet Take 800 mg by mouth 3 (three) times daily. 04/23/19  Yes [provider]  hydrOXYzine (VISTARIL) 50 MG capsule TAKE 1 CAPSULE BY MOUTH THREE TIMES DAILY AS NEEDED FOR ANXIETY Patient taking differently: Take 50 mg by mouth 3 (three) times daily as needed for anxiety. 03/23/21  Yes Spero Geralds, MD  ipratropium (ATROVENT) 0.02 % nebulizer solution Take 2.5 mLs by nebulization every 4 (four) hours as needed for wheezing or shortness of breath. 12/28/21  Yes [provider]  Ipratropium-Albuterol (COMBIVENT RESPIMAT) 20-100 MCG/ACT AERS respimat 1 puff as needed   Yes [provider]  meloxicam (MOBIC) 15 MG tablet Take 15 mg by mouth daily. 08/28/21  Yes [provider]  metFORMIN (GLUCOPHAGE) 1000 MG tablet Take 1,000 mg by mouth 2 (two) times daily. 11/01/19  Yes [provider]  nitroGLYCERIN (NITROSTAT) 0.4 MG SL tablet Place under the tongue. 09/08/21  Yes [provider]  olmesartan-hydrochlorothiazide (BENICAR HCT) 20-12.5 MG tablet Take 1 tablet by mouth daily. 05/05/21  Yes Tanda Rockers, MD  omeprazole (PRILOSEC) 20 MG capsule Take 20 mg by mouth  daily. 08/17/21  Yes [provider]  ondansetron (ZOFRAN-ODT) 4 MG disintegrating tablet Take 4 mg by mouth 2 (two) times daily as needed for nausea/vomiting. 11/19/21  Yes [provider]  oxybutynin (DITROPAN-XL) 5 MG 24 hr tablet Take 5 mg by mouth daily. 08/17/21  Yes [provider]  predniSONE (DELTASONE) 20 MG tablet Take 1 tablet (20 mg total) by mouth daily with breakfast. 12/16/21  Yes Parrett, Tammy S, NP  promethazine (PHENERGAN) 25 MG tablet Take 25 mg by mouth 3 (three) times daily as needed for nausea/vomiting. 11/23/21  Yes [provider]  rOPINIRole (REQUIP) 1 MG tablet Take 1 mg by mouth 3 (three) times daily.  09/12/15  Yes [provider]  simvastatin (ZOCOR) 20 MG tablet Take 20 mg by mouth daily. 08/17/21  Yes [provider]  SUMAtriptan (IMITREX) 25 MG tablet Take 25 mg by mouth every 2 (two) hours as needed for migraine. 12/23/20  Yes [provider]  benzonatate (TESSALON) 200 MG capsule Take 1 capsule (200 mg total) by mouth 3 (three) times daily as needed for cough. Patient not taking: Reported on 01/03/2022 12/16/21 12/16/22  Parrett, Fonnie Mu, NP  doxycycline (VIBRA-TABS) 100 MG tablet Take 1 tablet (100 mg total) by mouth 2 (two) times daily. Patient not taking: Reported on 01/03/2022 12/16/21   Parrett, Fonnie Mu, NP  olmesartan (BENICAR) 20 MG tablet Take 1 tablet (20 mg total) by mouth daily. 05/04/21   Tanda Rockers, MD  predniSONE (DELTASONE) 10 MG tablet Take 2 tablets daily until better then 1 tablet daily Patient not taking: Reported on 01/03/2022 12/10/21   Tanda Rockers, MD  torsemide [facility]) 20  MG tablet Take 1 tablet (20 mg total) by mouth daily as needed. May take an additional 20 mg daily as needed for extreme swelling. Patient not taking: Reported on 01/03/2022 04/17/21   Verta Ellen., NP      This patient is critically ill with multiple organ system failure; which, requires frequent high  complexity decision making, assessment, support, evaluation, and titration of therapies. This was completed through the application of advanced monitoring technologies and extensive interpretation of multiple databases. During this encounter critical care time was devoted to patient care services described in this note for 34 minutes.  Garner Nash, DO [facility] 01/03/2022 3:27 PM

## 2022-01-03 NOTE — Significant Event (Addendum)
Rapid Response Event Note   Reason for Call :  Increased work of breathing  Pt admitted with delirium related to polysubstance use. She is also here with COPD exacerbation.   Initial Focused Assessment:  Pt lying in bed, oriented x3. She is following commands. Skin is warm, moist with mild diaphoresis. Breath sounds are diminished in the bases. Expiratory wheeze and fine crackles noted. Pt denies pain. No tremors noted. Strength is equal in bilateral upper extremities.   VS: T 30F, BP 159/95, HR 117, RR 23, SpO2 91% 3LNC   Interventions:  -CXR -BiPAP -IVF stopped  -Xopenex given rather than Albuterol neb in the setting of tachycardia  Plan of Care:  -BiPAP for increased work of breathing -F/u with provider results of the CXR  0830 Reevaluation: Patient resting. Breathing comfortable on BiPAP.   Event Summary:  MD Notified: Dr. Tawanna Solo Call Time: [phone] Arrival Time: [id number] End Time: [place], RN

## 2022-01-03 NOTE — Progress Notes (Signed)
PROGRESS NOTE  [name]  JJH:[id number] DOB: 1955-10-19 DOA: 01/02/2022 PCP: Beverly Milch, NP   Brief Narrative: Patient is a 66 year old female with history of COPD, chronic hypoxic extremity failure on home oxygen, hypertension, polysubstance abuse, GERD, diabetes type 2 who was brought to the emergency department for the evaluation of somnolence secondary to alcohol/cocaine abuse at home.  Patient was brought by the husband through EMS.  As per EMS, there were several bottles of prescription medication that did not belong to her, patient was having uncontrollable movements, and currently speech.  Patient was given multiple dose of Ativan, Haldol to calm her down.  She was hypertensive in presentation.  Lab work showed leukocytosis, sodium of 125, elevated troponin  Assessment & Plan:  Principal Problem:   Delirium Active Problems:   Benzodiazepine dependence (HCC)   Essential hypertension   COPD exacerbation (HCC)   Polysubstance abuse (HCC)   Obesity (BMI 30-39.9)   DM (diabetes mellitus), type 2 (HCC)   Chronic hypoxemic respiratory failure (HCC)   Hyponatremia   Elevated troponin   Leukocytosis   NSTEMI (non-ST elevated myocardial infarction) ([place])   Acute somnolence/altered mental status: Likely secondary to substance abuse.  UDS positive for cocaine, benzodiazepines.  Use Narcan if needed.  CT head did not show any acute intracranial findings.  Elevated troponin: Troponin remarkably elevated after initial level of 100.  Started on heparin drip.  Will consulted cardiology though this is most likely secondary to supply demand ischemia from cocaine use. Not sure if patient has chest pain.  Echo ordered.  Leukocytosis: Most likely reactive.  No severe source of infection.  Afebrile.  Culture sent.  We will follow-up.  Improving  Tachypnea/acute hypoxic respiratory failure: Initially on room air but subsequently required oxygen supply now on BiPAP.  This is most  likely secondary to opiates causing respiratory inhibition versus COPD exacerbation.  ABG done this morning showed PCO2 44, pH of 7.38.  X-ray of the chest did not show any pleural effusion or pneumonia.  Polysubstance abuse: UDS positive for cocaine ,benzodiazepines.  COPD exacerbation: Started on azithromycin, bronchodilators, Solu-Medrol.  Continue mucolytic's  Chronic respiratory failure: From COPD, on home oxygen at 2 L/min.  Diabetes type 2: Continue sliding scale  insulin.  Monitor blood sugars  Hyponatremia: Improved 134.  Obesity: BMI 38.6        DVT prophylaxis:  IV heparin     Code Status: Full Code  Family Communication: Called and discussed with husband on phone on 7/2  Patient status:Inpatient  Patient is from :Home  Anticipated discharge YJ:EHUD  Estimated DC date:Not sure   Consultants: Cardiology  Procedures:None  Antimicrobials:  Anti-infectives (From admission, onward)    Start     Dose/Rate Route Frequency Ordered Stop   01/03/22 2245  azithromycin (ZITHROMAX) tablet 500 mg       See Hyperspace for full Linked Orders Report.   500 mg Oral Daily 01/02/22 2233 01/07/22 2159   01/02/22 2233  azithromycin (ZITHROMAX) 500 mg in sodium chloride 0.9 % 250 mL IVPB       See Hyperspace for full Linked Orders Report.   500 mg 250 mL/hr over 60 Minutes Intravenous Every 24 hours 01/02/22 2233 01/03/22 0040       Subjective: Patient seen and examined at the bedside this morning.  Hemodynamically stable.  On BiPAP.  Remains obtunded.  Lying in bed.  I discussed with husband at bedside.  He does not have any clue how she gets  the cocaine and other drugs  Objective: Vitals:   01/03/22 0259 01/03/22 0600 01/03/22 0730 01/03/22 0743  BP:  139/79  (!) 159/95  Pulse:  (!) 108 (!) 122   Resp:   (!) 28   Temp:      TempSrc:      SpO2: 95%  90%   Weight:      Height:        Intake/Output Summary (Last 24 hours) at 01/03/2022 0818 Last data filed at  01/03/2022 0040 Gross per 24 hour  Intake 1250 ml  Output --  Net 1250 ml   Filed Weights   01/02/22 0501  Weight: 102 kg    Examination:  General exam: Obtunded, drowsy,obese HEENT: PERRL, BiPAP Respiratory system: Diminished sounds bilaterally, wheezing Cardiovascular system: S1 & S2 heard, RRR.  Gastrointestinal system: Abdomen is nondistended, soft and nontender. Central nervous system: Not alert or awake  extremities: No edema, no clubbing ,no cyanosis Skin: No rashes, no ulcers,no icterus     Data Reviewed: I have personally reviewed following labs and imaging studies  CBC: Recent Labs  Lab 01/02/22 0451  WBC 26.0*  NEUTROABS 20.9*  HGB 12.3  HCT 35.6*  MCV 92.7  PLT 035   Basic Metabolic Panel: Recent Labs  Lab 01/02/22 0451 01/02/22 1638 01/02/22 2256  NA 125* 127* 129*  K 4.3 4.3 4.4  CL 88* 90* 92*  CO2 24 27 24   GLUCOSE 162* 174* 199*  BUN 13 14 15   CREATININE 0.64 0.65 0.59  CALCIUM 10.6* 10.0 10.0     Recent Results (from the past 240 hour(s))  Culture, blood (Routine X 2) w Reflex to ID Panel     Status: None (Preliminary result)   Collection Time: 01/02/22  5:09 PM   Specimen: BLOOD RIGHT HAND  Result Value Ref Range Status   Specimen Description   Final    BLOOD RIGHT HAND BOTTLES DRAWN AEROBIC AND ANAEROBIC   Special Requests Blood Culture adequate volume  Final   Culture   Final    NO GROWTH < 12 HOURS Performed at [facility], [address] [place], [place] [address]    Report Status PENDING  Incomplete  Culture, blood (Routine X 2) w Reflex to ID Panel     Status: None (Preliminary result)   Collection Time: 01/02/22  5:19 PM   Specimen: BLOOD LEFT HAND  Result Value Ref Range Status   Specimen Description   Final    BLOOD LEFT HAND BOTTLES DRAWN AEROBIC AND ANAEROBIC   Special Requests Blood Culture adequate volume  Final   Culture   Final    NO GROWTH < 12 HOURS Performed at [facility], [address] [place],  [place] [address]    Report Status PENDING  Incomplete     Radiology Studies: DG Chest 1V REPEAT Same Day  Result Date: 01/02/2022 CLINICAL DATA:  Altered mental status. EXAM: CHEST - 1 VIEW SAME DAY COMPARISON:  01/02/2022. FINDINGS: Cardiac silhouette is normal in size. No mediastinal or hilar masses. Prominent bronchovascular/interstitial markings, stable. No lung consolidation or convincing edema. No pleural effusion or pneumothorax. Skeletal structures are demineralized, but grossly intact. IMPRESSION: No acute cardiopulmonary disease. Electronically Signed   By: Lajean Manes M.D.   On: 01/02/2022 17:21   CT Head Wo Contrast  Result Date: 01/02/2022 CLINICAL DATA:  Mental status change. Unknown cause. History of illicit drug use. EXAM: CT HEAD WITHOUT CONTRAST TECHNIQUE: Contiguous axial images were obtained from the base  of the skull through the vertex without intravenous contrast. RADIATION DOSE REDUCTION: This exam was performed according to the departmental dose-optimization program which includes automated exposure control, adjustment of the mA and/or kV according to patient size and/or use of iterative reconstruction technique. COMPARISON:  Head CT 07/15/2014. FINDINGS: Despite efforts by the technologist and patient, moderate to severe motion artifact is present on today's exam and could not be eliminated. This reduces exam sensitivity and specificity. Multiple images were repeated. Brain: There is no evidence of acute intracranial hemorrhage, mass lesion, brain edema or extra-axial fluid collection. The ventricles and subarachnoid spaces are appropriately sized for age. There is no CT evidence of acute cortical infarction. Stable mild chronic low-density in the left frontal periventricular white matter. Vascular: Intracranial vascular calcifications. No hyperdense vessel identified. Skull: Negative for fracture or focal lesion. Sinuses/Orbits: Possible small fluid level in the right division of the  sphenoid sinus. The additional paranasal sinuses, mastoid air cells and middle ears are clear. No orbital abnormalities are seen. Other: None. IMPRESSION: 1. Significantly motion degraded examination demonstrates no definite acute intracranial findings. 2. Possible small fluid level within the right sphenoid sinus. No acute osseous findings identified. Electronically Signed   By: Richardean Sale M.D.   On: 01/02/2022 14:28   DG Chest Port 1 View  Result Date: 01/02/2022 CLINICAL DATA:  66 year old female with shortness of breath and altered mental status. EXAM: PORTABLE CHEST 1 VIEW COMPARISON:  Chest radiographs 04/29/2021 and earlier. FINDINGS: Portable AP semi upright view at 0754 hours. Stable lung volumes, mediastinal contours remain within normal limits. Visualized tracheal air column is within normal limits. Chronic coarse bilateral pulmonary interstitial opacity appears stable. No superimposed pneumothorax, pulmonary edema, or acute pulmonary opacity. No acute osseous abnormality identified. IMPRESSION: Chronic pulmonary interstitial disease. No acute cardiopulmonary abnormality. Electronically Signed   By: Genevie Ann M.D.   On: 01/02/2022 08:22    Scheduled Meds:  azithromycin  500 mg Oral Daily   fluticasone furoate-vilanterol  1 puff Inhalation Daily   folic acid  1 mg Oral Daily   insulin aspart  0-20 Units Subcutaneous Q4H   ipratropium-albuterol  3 mL Nebulization Q6H   LORazepam  0-4 mg Intravenous Q6H   Followed by   Derrill Memo ON 01/05/2022] LORazepam  0-4 mg Intravenous Q12H   methylPREDNISolone (SOLU-MEDROL) injection  60 mg Intravenous Q12H   multivitamin with minerals  1 tablet Oral Daily   pantoprazole (PROTONIX) IV  40 mg Intravenous QHS   thiamine  100 mg Oral Daily   Or   thiamine  100 mg Intravenous Daily   Continuous Infusions:  sodium chloride Stopped (01/03/22 0738)   heparin 1,300 Units/hr (01/03/22 0234)     LOS: 1 day   Shelly Coss, MD [facility] P7/08/2021, 8:18 AM

## 2022-01-04 DIAGNOSIS — R410 Disorientation, unspecified: Secondary | ICD-10-CM | POA: Diagnosis not present

## 2022-01-04 LAB — CBC
HCT: 32 % — ABNORMAL LOW (ref 36.0–46.0)
Hemoglobin: 10.9 g/dL — ABNORMAL LOW (ref 12.0–15.0)
MCH: 31.9 pg (ref 26.0–34.0)
MCHC: 34.1 g/dL (ref 30.0–36.0)
MCV: 93.6 fL (ref 80.0–100.0)
Platelets: 267 10*3/uL (ref 150–400)
RBC: 3.42 MIL/uL — ABNORMAL LOW (ref 3.87–5.11)
RDW: 12.6 % (ref 11.5–15.5)
WBC: 22.9 10*3/uL — ABNORMAL HIGH (ref 4.0–10.5)
nRBC: 0 % (ref 0.0–0.2)

## 2022-01-04 LAB — GLUCOSE, CAPILLARY
Glucose-Capillary: 176 mg/dL — ABNORMAL HIGH (ref 70–99)
Glucose-Capillary: 185 mg/dL — ABNORMAL HIGH (ref 70–99)
Glucose-Capillary: 209 mg/dL — ABNORMAL HIGH (ref 70–99)
Glucose-Capillary: 181 mg/dL — ABNORMAL HIGH (ref 70–99)
Glucose-Capillary: 195 mg/dL — ABNORMAL HIGH (ref 70–99)
Glucose-Capillary: 208 mg/dL — ABNORMAL HIGH (ref 70–99)

## 2022-01-04 LAB — BASIC METABOLIC PANEL
Anion gap: 10 (ref 5–15)
BUN: 16 mg/dL (ref 8–23)
CO2: 27 mmol/L (ref 22–32)
Calcium: 9.8 mg/dL (ref 8.9–10.3)
Chloride: 97 mmol/L — ABNORMAL LOW (ref 98–111)
Creatinine, Ser: 0.63 mg/dL (ref 0.44–1.00)
GFR, Estimated: >60 mL/min (ref >60)
Glucose, Bld: 176 mg/dL — ABNORMAL HIGH (ref 70–99)
Potassium: 4 mmol/L (ref 3.5–5.1)
Sodium: 134 mmol/L — ABNORMAL LOW (ref 135–145)

## 2022-01-04 LAB — HEMOGLOBIN A1C
Hgb A1c MFr Bld: 6.5 % — ABNORMAL HIGH (ref 4.8–5.6)
Mean Plasma Glucose: 140 mg/dL

## 2022-01-04 LAB — HEPARIN LEVEL (UNFRACTIONATED): Heparin Unfractionated: 0.39 IU/mL (ref 0.30–0.70)

## 2022-01-04 MED ORDER — HEPARIN SODIUM (PORCINE) 5000 UNIT/ML IJ SOLN
5000.0000 [IU] | Freq: Three times a day (TID) | INTRAMUSCULAR | Status: DC
  Administered 2022-01-04 – 2022-01-06 (×5): 5000 [IU] via SUBCUTANEOUS
  Filled 2022-01-04 (×5): qty 1

## 2022-01-04 MED ORDER — CLOPIDOGREL BISULFATE 75 MG PO TABS
300.0000 mg | ORAL_TABLET | Freq: Once | ORAL | Status: AC
Start: 2022-01-04 — End: 2022-01-04
  Administered 2022-01-04: 300 mg via ORAL
  Filled 2022-01-04: qty 4

## 2022-01-04 MED ORDER — LIP MEDEX EX OINT
TOPICAL_OINTMENT | CUTANEOUS | Status: DC | PRN
  Filled 2022-01-04: qty 7

## 2022-01-04 MED ORDER — CLOPIDOGREL BISULFATE 75 MG PO TABS
75.0000 mg | ORAL_TABLET | Freq: Every day | ORAL | Status: DC
Start: 2022-01-05 — End: 2022-01-06
  Administered 2022-01-05 – 2022-01-06 (×2): 75 mg via ORAL
  Filled 2022-01-04 (×2): qty 1

## 2022-01-04 MED ORDER — PREDNISONE 20 MG PO TABS
60.0000 mg | ORAL_TABLET | Freq: Two times a day (BID) | ORAL | Status: DC
  Administered 2022-01-04 – 2022-01-05 (×2): 60 mg via ORAL
  Filled 2022-01-04 (×2): qty 3

## 2022-01-04 MED ORDER — ACETAMINOPHEN 325 MG PO TABS
650.0000 mg | ORAL_TABLET | Freq: Four times a day (QID) | ORAL | Status: DC | PRN
  Administered 2022-01-04 (×2): 650 mg via ORAL
  Filled 2022-01-04 (×2): qty 2

## 2022-01-04 MED ORDER — ASPIRIN 81 MG PO TBEC
81.0000 mg | DELAYED_RELEASE_TABLET | Freq: Every day | ORAL | Status: DC
  Administered 2022-01-04 – 2022-01-06 (×3): 81 mg via ORAL
  Filled 2022-01-04 (×3): qty 1

## 2022-01-04 NOTE — Telephone Encounter (Signed)
Attempted to call pt but unable to reach. Unable to leave VM as mailbox is full. Due to multiple attempts trying to reach pt and unable to do so, per protocol encounter will be closed.

## 2022-01-04 NOTE — Progress Notes (Addendum)
ANTICOAGULATION CONSULT NOTE - Follow up [facility] for heparin Indication: chest pain/ACS  Allergies  Allergen Reactions   Penicillins Other (See Comments)    Patient is unsure if she allergic to penicillin or septra. Patient states one or another caused "rib pain with a little breathing problem". Has patient had a PCN reaction causing immediate rash, facial/tongue/throat swelling, SOB or lightheadedness with hypotension: YES Has patient had a PCN reaction causing severe rash involving mucus membranes or skin necrosis: NO Has patient had a PCN reaction that required hospitalization: NO Has patient had a PCN reaction occurring within the last 10 years: NO   Septra [Sulfamethoxazole-Trimethoprim] Other (See Comments)    Patient is unsure if she allergic to septra or penicillin. Patient states one or another caused "rib pain with a little breathing problem".    Patient Measurements: Height: 5\' 4"  (162.6 cm) Weight: 102 kg (224 lb 13.9 oz) IBW/kg (Calculated) : 54.7 Heparin Dosing Weight: 78 kg  Vital Signs: Temp: 98.3 F (36.8 C) (07/03 0357) Temp Source: Axillary (07/03 0357) BP: 124/79 (07/03 0600) Pulse Rate: 99 (07/03 0600)  Labs: Recent Labs    01/02/22 0451 01/02/22 1638 01/02/22 2256 01/03/22 0108 01/03/22 0332 01/03/22 0913 01/04/22 0007  HGB 12.3  --   --   --   --  11.2* 10.9*  HCT 35.6*  --   --   --   --  33.7* 32.0*  PLT 339  --   --   --   --  250 267  HEPARINUNFRC  --   --   --  0.25*  --  0.34 0.39  CREATININE 0.64 0.65 0.59  --   --  0.70 0.63  CKTOTAL 289* 670* 371*  --   --   --   --   TROPONINIHS 100* 2,788* 2,037*  --  2,311*  --   --      Estimated Creatinine Clearance: 80.4 mL/min (by C-G formula based on SCr of 0.63 mg/dL).   Medical History: Past Medical History:  Diagnosis Date   Anemia    Anxiety    Arthritis    Bipolar 1 disorder (HCC)    CHF (congestive heart failure) (HCC)    COPD (chronic obstructive pulmonary  disease) (HCC)    Depression    Diabetes mellitus without complication (HCC)    Dyspnea    Emphysema of lung (HCC)    GERD (gastroesophageal reflux disease)    Headache    migraines   History of hiatal hernia    History of kidney stones    Hyperlipidemia    Hypertension    Neuromuscular disorder (HCC)    neuropathy   On home oxygen therapy    Pneumonia 2015, 2019   Tracheomalacia    Vaginal Pap smear, abnormal     Medications:  Medications Prior to Admission  Medication Sig Dispense Refill Last Dose   acetaminophen (TYLENOL) 500 MG tablet Take 1 tablet (500 mg total) by mouth every 6 (six) hours as needed. 30 tablet 0 UNK   albuterol (PROVENTIL) (2.5 MG/3ML) 0.083% nebulizer solution INHALE 1 VIAL VIA NEBULIZER EVERY 4 HOURS (Patient taking differently: Take 2.5 mg by nebulization every 4 (four) hours as needed for wheezing or shortness of breath.) 360 mL 2 UNK   albuterol (VENTOLIN HFA) 108 (90 Base) MCG/ACT inhaler INHALE 1 OR 2 PUFFS BY MOUTH EVERY 6 HOURS AS NEEDED FOR WHEEZING OR SHORTNESS OF BREATH (Patient taking differently: Inhale 2 puffs into the lungs  every 6 (six) hours as needed for wheezing or shortness of breath.) 8.5 g 5 UNK   BREZTRI AEROSPHERE 160-9-4.8 MCG/ACT AERO INHALE TWO PUFFS INTO THE LUNGS IN THE MORNING ANDAT BEDTIME. (Patient taking differently: Inhale 2 puffs into the lungs 2 (two) times daily.) 10.7 g 0 UNK   cetirizine (ZYRTEC) 10 MG tablet Take 10 mg by mouth daily as needed for allergies.    UNK   cyclobenzaprine (FLEXERIL) 10 MG tablet Take 10 mg by mouth 3 (three) times daily as needed for muscle spasms.   UNK   DULoxetine (CYMBALTA) 30 MG capsule Take 1 capsule by mouth daily.   UNK   FLUoxetine (PROZAC) 20 MG capsule Take 20 mg by mouth daily.   UNK   gabapentin (NEURONTIN) 800 MG tablet Take 800 mg by mouth 3 (three) times daily.   UNK   hydrOXYzine (VISTARIL) 50 MG capsule TAKE 1 CAPSULE BY MOUTH THREE TIMES DAILY AS NEEDED FOR ANXIETY (Patient  taking differently: Take 50 mg by mouth 3 (three) times daily as needed for anxiety.) 90 capsule 0 UNK   ipratropium (ATROVENT) 0.02 % nebulizer solution Take 2.5 mLs by nebulization every 4 (four) hours as needed for wheezing or shortness of breath.   UNK   Ipratropium-Albuterol (COMBIVENT RESPIMAT) 20-100 MCG/ACT AERS respimat 1 puff as needed   UNK   meloxicam (MOBIC) 15 MG tablet Take 15 mg by mouth daily.   UNK   metFORMIN (GLUCOPHAGE) 1000 MG tablet Take 1,000 mg by mouth 2 (two) times daily.   UNK   nitroGLYCERIN (NITROSTAT) 0.4 MG SL tablet Place under the tongue.   UNK   olmesartan-hydrochlorothiazide (BENICAR HCT) 20-12.5 MG tablet Take 1 tablet by mouth daily. 30 tablet 11 UNK   omeprazole (PRILOSEC) 20 MG capsule Take 20 mg by mouth daily.   UNK   ondansetron (ZOFRAN-ODT) 4 MG disintegrating tablet Take 4 mg by mouth 2 (two) times daily as needed for nausea/vomiting.   UNK   oxybutynin (DITROPAN-XL) 5 MG 24 hr tablet Take 5 mg by mouth daily.   UNK   predniSONE (DELTASONE) 20 MG tablet Take 1 tablet (20 mg total) by mouth daily with breakfast. 30 tablet 0 UNK   promethazine (PHENERGAN) 25 MG tablet Take 25 mg by mouth 3 (three) times daily as needed for nausea/vomiting.   UNK   rOPINIRole (REQUIP) 1 MG tablet Take 1 mg by mouth 3 (three) times daily.    UNK   simvastatin (ZOCOR) 20 MG tablet Take 20 mg by mouth daily.   UNK   SUMAtriptan (IMITREX) 25 MG tablet Take 25 mg by mouth every 2 (two) hours as needed for migraine.   UNK   benzonatate (TESSALON) 200 MG capsule Take 1 capsule (200 mg total) by mouth 3 (three) times daily as needed for cough. (Patient not taking: Reported on 01/03/2022) 30 capsule 1 Not Taking   doxycycline (VIBRA-TABS) 100 MG tablet Take 1 tablet (100 mg total) by mouth 2 (two) times daily. (Patient not taking: Reported on 01/03/2022) 14 tablet 0 Not Taking   olmesartan (BENICAR) 20 MG tablet Take 1 tablet (20 mg total) by mouth daily. 30 tablet 11 UNK   predniSONE  (DELTASONE) 10 MG tablet Take 2 tablets daily until better then 1 tablet daily (Patient not taking: Reported on 01/03/2022) 30 tablet 0 Not Taking   torsemide (DEMADEX) 20 MG tablet Take 1 tablet (20 mg total) by mouth daily as needed. May take an additional 20 mg daily as  needed for extreme swelling. (Patient not taking: Reported on 01/03/2022) 90 tablet 1 Not Taking    Assessment: Pharmacy consulted to dose heparin in patient with chest pain/ACS.  Patient is not on anticoagulation prior to admission.  Heparin level of 0.39 is therapeutic on heparin 1300 units/hr. Hgb 10.9. Plts wnl. No bleeding noted per RN.   Goal of Therapy:  Heparin level 0.3-0.7 units/ml Monitor platelets by anticoagulation protocol: Yes   Plan:  Continue heparin drip 1300 uts/hr  Daily heparin level  and cbc  Monitor s/s bleeding    Cristela Felt, PharmD, BCPS Clinical Pharmacist 01/04/2022 6:47 AM  Addendum: Discussed with Dr. Gasper Sells and plan for heparin x 48 hrs.   Plan:  - Stop heparin at 1800 on 01/04/22   Cristela Felt, PharmD, BCPS Clinical Pharmacist 01/04/2022 8:53 AM

## 2022-01-04 NOTE — Plan of Care (Signed)
  Problem: Education: Goal: Knowledge of General Education information will improve Description: Including pain rating scale, medication(s)/side effects and non-pharmacologic comfort measures Outcome: Progressing   Problem: Clinical Measurements: Goal: Ability to maintain clinical measurements within normal limits will improve Outcome: Progressing Goal: Respiratory complications will improve Outcome: Progressing   Problem: Activity: Goal: Risk for activity intolerance will decrease Outcome: Progressing   Problem: Nutrition: Goal: Adequate nutrition will be maintained Outcome: Progressing   Problem: Coping: Goal: Level of anxiety will decrease Outcome: Progressing   Problem: Elimination: Goal: Will not experience complications related to bowel motility Outcome: Progressing Goal: Will not experience complications related to urinary retention Outcome: Progressing   Problem: Safety: Goal: Ability to remain free from injury will improve Outcome: Progressing   Problem: Skin Integrity: Goal: Risk for impaired skin integrity will decrease Outcome: Progressing

## 2022-01-04 NOTE — Progress Notes (Signed)
Progress Note  Patient Name: [name] Date of Encounter: 01/04/2022  Primary Cardiologist: Carlyle Dolly, MD   Subjective   Overnight echocardiogram suggestive of new apical injury . Patient notes she is optimistic to get off of BIPAP. No CP, SOB, Palpitations.  Inpatient Medications    Scheduled Meds:  arformoterol  15 mcg Nebulization BID   azithromycin  500 mg Oral Daily   budesonide (PULMICORT) nebulizer solution  0.25 mg Nebulization BID   Chlorhexidine Gluconate Cloth  6 each Topical Daily   folic acid  1 mg Oral Daily   insulin aspart  0-20 Units Subcutaneous Q4H   LORazepam  0-4 mg Intravenous Q6H   Followed by   Derrill Memo ON 01/05/2022] LORazepam  0-4 mg Intravenous Q12H   methylPREDNISolone (SOLU-MEDROL) injection  60 mg Intravenous Q12H   multivitamin with minerals  1 tablet Oral Daily   pantoprazole (PROTONIX) IV  40 mg Intravenous QHS   revefenacin  175 mcg Nebulization Daily   thiamine  100 mg Oral Daily   Or   thiamine  100 mg Intravenous Daily   Continuous Infusions:  sodium chloride Stopped (01/03/22 0738)   heparin 1,300 Units/hr (01/04/22 0700)   PRN Meds: albuterol, haloperidol lactate, hydrALAZINE, LORazepam **OR** LORazepam, ondansetron **OR** ondansetron (ZOFRAN) IV   Vital Signs    Vitals:   01/04/22 0600 01/04/22 0700 01/04/22 0735 01/04/22 0754  BP: 124/79 100/90    Pulse: 99 95  (!) 103  Resp: 18 16  (!) 24  Temp:   97.7 F (36.5 C)   TempSrc:   Axillary   SpO2: 97% 99%  99%  Weight:      Height:        Intake/Output Summary (Last 24 hours) at 01/04/2022 0810 Last data filed at 01/04/2022 0700 Gross per 24 hour  Intake 496.22 ml  Output 1050 ml  Net -553.78 ml   Filed Weights   01/02/22 0501  Weight: 102 kg    Telemetry    Sinus tachycardia - Personally Reviewed  Physical Exam   Gen: Mo distress, older than stated age   Neck: No JVD  Cardiac: No Rubs or Gallops, no Murmur, regular tachycardia, +2 radial  pulses Respiratory: course rhonchi bilaterally, normal effort, elevated  respiratory rate GI: Soft, nontender, non-distended  MS: trace  edema;  moves all extremities Integument: Skin feels warm, has well dress wounded on r leg Neuro:  At time of evaluation, alert and oriented to person/place/time/situation  Psych: Normal affect, patient feels ok   Labs    Chemistry Recent Labs  Lab 01/02/22 0451 01/02/22 1638 01/03/22 0332 01/03/22 0913 01/04/22 0007  NA 125*   < > 131* 134* 134*  K 4.3   < > 4.2 4.3 4.0  CL 88*   < > 96* 98 97*  CO2 24   < > 24 23 27   GLUCOSE 162*   < > 167* 174* 176*  BUN 13   < > 11 12 16   CREATININE 0.64   < > 0.83 0.70 0.63  CALCIUM 10.6*   < > 9.6 9.6 9.8  PROT 7.5  --   --   --   --   ALBUMIN 4.0  --   --   --   --   AST 32  --   --   --   --   ALT 27  --   --   --   --   ALKPHOS 75  --   --   --   --  BILITOT 0.3  --   --   --   --   GFRNONAA >60   < > >60 >60 >60  ANIONGAP 13   < > 11 13 10    < > = values in this interval not displayed.     Hematology Recent Labs  Lab 01/03/22 0332 01/03/22 0913 01/04/22 0007  WBC 20.1* 18.4* 22.9*  RBC 3.60* 3.57* 3.42*  HGB 11.7* 11.2* 10.9*  HCT 33.3* 33.7* 32.0*  MCV 92.5 94.4 93.6  MCH 32.5 31.4 31.9  MCHC 35.1 33.2 34.1  RDW 12.3 12.5 12.6  PLT 250 250 267    Cardiac EnzymesNo results for input(s): "TROPONINI" in the last 168 hours. No results for input(s): "TROPIPOC" in the last 168 hours.   BNP Recent Labs  Lab 01/03/22 0913  BNP 335.6*     DDimer No results for input(s): "DDIMER" in the last 168 hours.   Radiology    ECHOCARDIOGRAM COMPLETE  Result Date: 01/03/2022    ECHOCARDIOGRAM REPORT   Patient Name:   ELAJAH KUNSMAN Peterson Date of Exam: 01/03/2022 Medical Rec #:  [id number]             Height:       64.0 in Accession #:    [id number]            Weight:       224.9 lb Date of Birth:  07-Jan-1956            BSA:          2.056 m Patient Age:    66 years              BP:            125/75 mmHg Patient Gender: F                     HR:           109 bpm. Exam Location:  Inpatient Procedure: 2D Echo Indications:    NSTEMI  History:        Patient has prior history of Echocardiogram examinations, most                 recent 06/14/2019. COPD; Risk Factors:Hypertension,                 Dyslipidemia, Diabetes, polysubstance abuse and Current Smoker.  Sonographer:    Johny Chess RDCS Referring Phys: [phone] JACOB J STINSON  Sonographer Comments: Image acquisition challenging due to respiratory motion and Image acquisition challenging due to COPD. IMPRESSIONS  1. Septal and apical hypokinesis . Left ventricular ejection fraction, by estimation, is 40 to 45%. The left ventricle has mildly decreased function. The left ventricle demonstrates regional wall motion abnormalities (see scoring diagram/findings for description). The left ventricular internal cavity size was mildly dilated. Left ventricular diastolic parameters are indeterminate.  2. Right ventricular systolic function is normal. The right ventricular size is normal.  3. The mitral valve is normal in structure. No evidence of mitral valve regurgitation.  4. The aortic valve is tricuspid. There is mild calcification of the aortic valve. Aortic valve regurgitation is not visualized. Aortic valve sclerosis is present, with no evidence of aortic valve stenosis. FINDINGS  Left Ventricle: Septal and apical hypokinesis. Left ventricular ejection fraction, by estimation, is 40 to 45%. The left ventricle has mildly decreased function. The left ventricle demonstrates regional wall motion abnormalities. The left ventricular internal cavity size was mildly dilated. There is no  left ventricular hypertrophy. Left ventricular diastolic parameters are indeterminate. Right Ventricle: The right ventricular size is normal. No increase in right ventricular wall thickness. Right ventricular systolic function is normal. Left Atrium: Left atrial size was normal  in size. Right Atrium: Right atrial size was normal in size. Pericardium: There is no evidence of pericardial effusion. Mitral Valve: The mitral valve is normal in structure. No evidence of mitral valve regurgitation. Tricuspid Valve: The tricuspid valve is not well visualized. Tricuspid valve regurgitation is mild. Aortic Valve: The aortic valve is tricuspid. There is mild calcification of the aortic valve. Aortic valve regurgitation is not visualized. Aortic valve sclerosis is present, with no evidence of aortic valve stenosis. Pulmonic Valve: The pulmonic valve was not well visualized. Pulmonic valve regurgitation is not visualized. Aorta: The aortic root is normal in size and structure. IAS/Shunts: The interatrial septum was not well visualized.  LEFT VENTRICLE PLAX 2D LVIDd:         3.50 cm LVIDs:         2.30 cm LV PW:         1.10 cm LV IVS:        1.00 cm LVOT diam:     1.80 cm LV SV:         39 LV SV Index:   19 LVOT Area:     2.54 cm  LV Volumes (MOD) LV vol d, MOD A4C: 75.4 ml LV vol s, MOD A4C: 42.0 ml LV SV MOD A4C:     75.4 ml RIGHT VENTRICLE             IVC RV S prime:     12.20 cm/s  IVC diam: 2.30 cm TAPSE (M-mode): 2.4 cm LEFT ATRIUM             Index        RIGHT ATRIUM           Index LA diam:        3.10 cm 1.51 cm/m   RA Area:     13.50 cm LA Vol (A2C):   51.4 ml 25.00 ml/m  RA Volume:   31.20 ml  15.17 ml/m LA Vol (A4C):   46.5 ml 22.61 ml/m LA Biplane Vol: 50.9 ml 24.75 ml/m  AORTIC VALVE LVOT Vmax:   94.70 cm/s LVOT Vmean:  63.600 cm/s LVOT VTI:    0.153 m  AORTA Ao Root diam: 2.90 cm Ao Asc diam:  3.40 cm  SHUNTS Systemic VTI:  0.15 m Systemic Diam: 1.80 cm Jenkins Rouge MD Electronically signed by Jenkins Rouge MD Signature Date/Time: 01/03/2022/6:36:16 PM    Final    DG Chest Port 1 View  Result Date: 01/03/2022 CLINICAL DATA:  66 year old female with history of acute respiratory distress. EXAM: PORTABLE CHEST 1 VIEW COMPARISON:  Chest x-ray 01/02/2022. FINDINGS: There is  cephalization of the pulmonary vasculature and slight indistinctness of the interstitial markings suggestive of mild pulmonary edema. No definite pleural effusions. No pneumothorax. No evidence heart size is mildly enlarged. Upper mediastinal contours are within normal limits. Atherosclerotic calcifications in the thoracic aorta. IMPRESSION: 1. The appearance the chest suggests mild congestive heart failure, as above. Electronically Signed   By: Vinnie Langton M.D.   On: 01/03/2022 08:20   DG Chest 1V REPEAT Same Day  Result Date: 01/02/2022 CLINICAL DATA:  Altered mental status. EXAM: CHEST - 1 VIEW SAME DAY COMPARISON:  01/02/2022. FINDINGS: Cardiac silhouette is normal in size. No mediastinal or hilar masses. Prominent bronchovascular/interstitial  markings, stable. No lung consolidation or convincing edema. No pleural effusion or pneumothorax. Skeletal structures are demineralized, but grossly intact. IMPRESSION: No acute cardiopulmonary disease. Electronically Signed   By: Lajean Manes M.D.   On: 01/02/2022 17:21   CT Head Wo Contrast  Result Date: 01/02/2022 CLINICAL DATA:  Mental status change. Unknown cause. History of illicit drug use. EXAM: CT HEAD WITHOUT CONTRAST TECHNIQUE: Contiguous axial images were obtained from the base of the skull through the vertex without intravenous contrast. RADIATION DOSE REDUCTION: This exam was performed according to the departmental dose-optimization program which includes automated exposure control, adjustment of the mA and/or kV according to patient size and/or use of iterative reconstruction technique. COMPARISON:  Head CT 07/15/2014. FINDINGS: Despite efforts by the technologist and patient, moderate to severe motion artifact is present on today's exam and could not be eliminated. This reduces exam sensitivity and specificity. Multiple images were repeated. Brain: There is no evidence of acute intracranial hemorrhage, mass lesion, brain edema or extra-axial  fluid collection. The ventricles and subarachnoid spaces are appropriately sized for age. There is no CT evidence of acute cortical infarction. Stable mild chronic low-density in the left frontal periventricular white matter. Vascular: Intracranial vascular calcifications. No hyperdense vessel identified. Skull: Negative for fracture or focal lesion. Sinuses/Orbits: Possible small fluid level in the right division of the sphenoid sinus. The additional paranasal sinuses, mastoid air cells and middle ears are clear. No orbital abnormalities are seen. Other: None. IMPRESSION: 1. Significantly motion degraded examination demonstrates no definite acute intracranial findings. 2. Possible small fluid level within the right sphenoid sinus. No acute osseous findings identified. Electronically Signed   By: Richardean Sale M.D.   On: 01/02/2022 14:28     Patient Profile     66 y.o. female hx of polysubstance abuse with delerium and concerns for toxic ingestion complicated by NSTEMI with new cardiomyopathy  Assessment & Plan    NSTEMI COPD HTN with DM Tobacco abuse- current Benzo positive with unclear timeline Cocaine abuse-current last positive 01/02/22 Chronic hyponatremia New LVEF ~ 45% - in the setting of active cocaine use DDX includes coronary vasospasm vs plaque rupture event; at high risk for both - will plan for ASA and plavix and 48 hours of heparin; once the cocaine has worked out of her system can re-evaluate for LHC (potentially as outpatient), give risk of vasospasm during cath - BNP for tomorrow, may start low dose lasix - reviewed echo findings with patient and SO    For questions or updates, please contact [facility] Please consult [email or web address] for contact info under Cardiology/STEMI.      Rudean Haskell, MD [address], [address] [place], [place] [address] [phone]  8:10 AM

## 2022-01-04 NOTE — Progress Notes (Signed)
Pt refused biPAP/CPAP for the evening. RN aware, RT will cont to monitor as needed.

## 2022-01-04 NOTE — Progress Notes (Signed)
NAME:  [name], MRN:  [id number], DOB:  [birth year], LOS: 2 ADMISSION DATE:  01/02/2022, CONSULTATION DATE:  01/03/2022 REFERRING MD: Tawanna Solo - [facility] CHIEF COMPLAINT:  ARF, AMS  History of Present Illness:   66 year old woman who presented to [facility] ED 7/1 with acute respiratory distress, AMS with delirium/agitation after crack cocaine use. PMHx significant for HTN, HLD, T2DM, CHF, bipolar disorder, chronic hypoxemic respiratory failure with COPD (on 4L HOT), polysubstance abuse (EtOH, tobacco, prescription drug misuse, crack cocaine).  In ED, hypertensive with SBP 200s. Labs were notable for WBC 26, Na 125, Cr 0.64 (baseline), LA 2.1, elevated CK and elevated troponins (200 to 2800, likely demand-associated in the setting of cocaine use). UDS+ cocaine, benzos. CXR demonstrated vascular congestion and pulmonary edema. CT Head negative. Empiric antibiotics and steroid course initiated. Given elevation in troponins, patient transferred to [facility] with plan for Cardiology consult.  On arrival to [facility] (SDU), patient noted to be tachypneic with labored breathing/lethargy prompting Rapid Response and BiPAP initiation.  PCCM consulted for ICU admission.  Pertinent Medical History:   Past Medical History:  Diagnosis Date   Anemia    Anxiety    Arthritis    Bipolar 1 disorder (HCC)    CHF (congestive heart failure) (HCC)    COPD (chronic obstructive pulmonary disease) (HCC)    Depression    Diabetes mellitus without complication (HCC)    Dyspnea    Emphysema of lung (HCC)    GERD (gastroesophageal reflux disease)    Headache    migraines   History of hiatal hernia    History of kidney stones    Hyperlipidemia    Hypertension    Neuromuscular disorder (HCC)    neuropathy   On home oxygen therapy    Pneumonia 2015, 2019   Tracheomalacia    Vaginal Pap smear, abnormal    Significant Hospital Events: Including procedures, antibiotic start and stop dates in addition to other  pertinent events   7/1 Presented to [facility] ED with AMS (delirium, agitation), respiratory distress. Initial plan for admission to [facility] for delirium management post-crack cocaine use. Trops elevated. Transfer to [facility] for Cards eval. Heparin gtt started. Steroids/antibiotics started. BiPAP initiated. 7/2 - Tachypneic and lethargic on the floor. Rapid Response called. ABG obtained, patient somnolent and placed on BiPAP. Transferred to ICU, PCCM consulted. 7/3 Improved mental status and respiratory status. BiPAP removed, 4LNC. Passed bedside swallow.  Interim History / Subjective:  No significant events overnight Maintained on BiPAP overnight/this AM Mental status significantly improved, alert/oriented x 4 BiPAP transitioned off to 4LNC (home O2 baseline) Passed bedside swallow  Objective:  Blood pressure 100/90, pulse 95, temperature 97.7 F (36.5 C), temperature source Axillary, resp. rate 16, height 5\' 4"  (1.626 m), weight 102 kg, SpO2 99 %.    FiO2 (%):  [30 %-40 %] 30 %   Intake/Output Summary (Last 24 hours) at 01/04/2022 0745 Last data filed at 01/04/2022 0700 Gross per 24 hour  Intake 496.22 ml  Output 1050 ml  Net -553.78 ml    Filed Weights   01/02/22 0501  Weight: 102 kg   Physical Examination: General: Chronically ill-appearing middle-aged woman in NAD. HEENT: [place]/AT, anicteric sclera, PERRL, dry mucous membranes. BiPAP mask in place (now removed). Neuro: Awake, oriented x 4. Responds to verbal stimuli. Following commands consistently. Moves all 4 extremities spontaneously. CV: RRR, no m/g/r. PULM: Breathing even and unlabored on 4LNC (home O2 baseline). Lung fields with diffuse wheezing throughout, diminished at bilateral bases. GI:  Soft, nontender, nondistended. Normoactive bowel sounds. Extremities: No LE edema noted. Skin: Warm/dry, no rashes. Scattered areas of ecchymosis to BUE/BLE.  Resolved Hospital Problem List:   Acute metabolic encephalopathy related to cocaine  use  Assessment & Plan:   Acute-on-chronic hypoxemic respiratory failure, now BIPAP-dependent  AECOPD - Continue supplemental O2 support, currently on home baseline 4LNC - BiPAP PRN + QHS - Wean FiO2 for O2 sat > 90% - Pulmonary hygiene - Bronchodilators - Brovana/Yupelri, Pulmicort, Albuterol - Continue steroid course, transition to PO as able - Continue empiric azithromycin - Intermittent CXR  NSTEMI, apical Troponin 200 to 2800, initially thought to be demand-associated in the setting of cocaine use (?vasospasm, ?plaque rupture). EKG without significant changes. Echo 7/2 showing septal/apical hypokinesis, LVEF 40-45%, +RWMAs; concern for new apical injury - Cardiology consulted, appreciate recs - Heparin gtt x 48H (end 7/3 1800) - Initiation of goal-directed therapy with ASA/Plavix - No BB in the setting of cocaine use - No urgent indication for cath at this time, await clearance of cocaine - Cardiac monitoring - F/u BNP 7/4 - Consider low dose Lasix  Polysubstance abuse Cocaine abuse  Tobacco abuse  History of alcohol use  - Monitor for signs/symptoms of withdrawal - Continue Ativan, CIWA protocol - Continue thiamine, MV - Narcan available if needed  Best Practice (right click and "Reselect all SmartList Selections" daily)   Diet/type: NPO with sips/chips, ADAT pending continued improved mental status DVT prophylaxis: systemic heparin, transition to [facility] 7/3PM GI prophylaxis: N/A Lines: N/A Foley:  N/A Code Status:  full code Last date of multidisciplinary goals of care discussion [husband updated at bedside 7/3]  Nearing readiness for floor/progressive transfer, pending continued improved mental status (7/3 vs. 7/4)  Critical care time:   The patient is critically ill with multiple organ system failure and requires high complexity decision making for assessment and support, frequent evaluation and titration of therapies, advanced monitoring, review of radiographic  studies and interpretation of complex data.   Critical Care Time devoted to patient care services, exclusive of separately billable procedures, described in this note is 43 minutes  Lestine Mount, PA-C [facility] 01/04/22 7:48 AM  Please see [email or web address] for pager details.  From 7A-7P if no response, please call [phone] After hours, please call ELink [phone]

## 2022-01-05 ENCOUNTER — Telehealth: Payer: Self-pay | Admitting: Pulmonary Disease

## 2022-01-05 DIAGNOSIS — R410 Disorientation, unspecified: Secondary | ICD-10-CM | POA: Diagnosis not present

## 2022-01-05 LAB — CBC
HCT: 32.6 % — ABNORMAL LOW (ref 36.0–46.0)
Hemoglobin: 11.1 g/dL — ABNORMAL LOW (ref 12.0–15.0)
MCH: 32.1 pg (ref 26.0–34.0)
MCHC: 34 g/dL (ref 30.0–36.0)
MCV: 94.2 fL (ref 80.0–100.0)
Platelets: 248 10*3/uL (ref 150–400)
RBC: 3.46 MIL/uL — ABNORMAL LOW (ref 3.87–5.11)
RDW: 12.8 % (ref 11.5–15.5)
WBC: 22.2 10*3/uL — ABNORMAL HIGH (ref 4.0–10.5)
nRBC: 0 % (ref 0.0–0.2)

## 2022-01-05 LAB — GLUCOSE, CAPILLARY
Glucose-Capillary: 187 mg/dL — ABNORMAL HIGH (ref 70–99)
Glucose-Capillary: 193 mg/dL — ABNORMAL HIGH (ref 70–99)
Glucose-Capillary: 210 mg/dL — ABNORMAL HIGH (ref 70–99)
Glucose-Capillary: 230 mg/dL — ABNORMAL HIGH (ref 70–99)
Glucose-Capillary: 340 mg/dL — ABNORMAL HIGH (ref 70–99)

## 2022-01-05 LAB — MAGNESIUM: Magnesium: 1.3 mg/dL — ABNORMAL LOW (ref 1.7–2.4)

## 2022-01-05 LAB — BASIC METABOLIC PANEL
Anion gap: 10 (ref 5–15)
BUN: 22 mg/dL (ref 8–23)
CO2: 29 mmol/L (ref 22–32)
Calcium: 10.5 mg/dL — ABNORMAL HIGH (ref 8.9–10.3)
Chloride: 97 mmol/L — ABNORMAL LOW (ref 98–111)
Creatinine, Ser: 0.63 mg/dL (ref 0.44–1.00)
GFR, Estimated: >60 mL/min (ref >60)
Glucose, Bld: 198 mg/dL — ABNORMAL HIGH (ref 70–99)
Potassium: 4.2 mmol/L (ref 3.5–5.1)
Sodium: 136 mmol/L (ref 135–145)

## 2022-01-05 LAB — BRAIN NATRIURETIC PEPTIDE: B Natriuretic Peptide: 130.1 pg/mL — ABNORMAL HIGH (ref 0.0–100.0)

## 2022-01-05 MED ORDER — PREDNISONE 20 MG PO TABS
40.0000 mg | ORAL_TABLET | Freq: Every day | ORAL | Status: DC
  Administered 2022-01-06: 40 mg via ORAL
  Filled 2022-01-05: qty 2

## 2022-01-05 MED ORDER — MAGNESIUM SULFATE 4 GM/100ML IV SOLN
4.0000 g | Freq: Once | INTRAVENOUS | Status: AC
  Administered 2022-01-05: 4 g via INTRAVENOUS
  Filled 2022-01-05: qty 100

## 2022-01-05 MED ORDER — ROSUVASTATIN CALCIUM 5 MG PO TABS
10.0000 mg | ORAL_TABLET | Freq: Every day | ORAL | Status: DC
  Administered 2022-01-05 – 2022-01-06 (×2): 10 mg via ORAL
  Filled 2022-01-05 (×2): qty 2

## 2022-01-05 MED ORDER — CEFTRIAXONE SODIUM 1 G IJ SOLR
1.0000 g | INTRAMUSCULAR | Status: DC
Start: 2022-01-05 — End: 2022-01-06
  Administered 2022-01-05 – 2022-01-06 (×2): 1 g via INTRAVENOUS
  Filled 2022-01-05 (×2): qty 10

## 2022-01-05 MED ORDER — CEFTRIAXONE SODIUM 2 G IJ SOLR
2.0000 g | INTRAMUSCULAR | Status: DC
Start: 2022-01-05 — End: 2022-01-05

## 2022-01-05 MED ORDER — FUROSEMIDE 40 MG PO TABS
40.0000 mg | ORAL_TABLET | Freq: Once | ORAL | Status: AC
  Administered 2022-01-05: 40 mg via ORAL
  Filled 2022-01-05: qty 1

## 2022-01-05 NOTE — Progress Notes (Signed)
PROGRESS NOTE  [name]  EXH:[id number] DOB: August 07, 1955 DOA: 01/02/2022 PCP: Beverly Milch, NP   Brief Narrative: Patient is a 66 year old female with history of COPD, chronic hypoxic respiratory failure on home oxygen, hypertension, polysubstance abuse, GERD, diabetes type 2 who was brought to the emergency department for the evaluation of somnolence secondary to alcohol/cocaine abuse at home.  Patient was brought by the husband through EMS.  As per EMS, there were several bottles of prescription medication that did not belong to her, patient was having uncontrollable movements, and slurred speech.  Patient was given multiple dose of Ativan, Haldol to calm her down.  She was hypertensive in presentation.  Lab work showed leukocytosis, sodium of 125, elevated troponin.  Due to her increased work of breathing, she was put on BiPAP at she was transferred to ICU on 7/2.  Now back to [facility] service on 7/4, off BiPAP  Assessment & Plan:  Principal Problem:   Delirium Active Problems:   Benzodiazepine dependence (HCC)   Essential hypertension   COPD exacerbation (HCC)   Polysubstance abuse (HCC)   Obesity (BMI 30-39.9)   DM (diabetes mellitus), type 2 (HCC)   Chronic hypoxemic respiratory failure (HCC)   Hyponatremia   Elevated troponin   Leukocytosis   NSTEMI (non-ST elevated myocardial infarction) (HCC)   Pressure injury of skin  Tachypnea/acute chronic hypoxic respiratory failure: On 2 L of oxygen at home.  Initially on room air but subsequently required oxygen then had to put on BiPAP.  This is most likely secondary to opiates causing respiratory inhibition versus COPD exacerbation.X-ray of the chest did not show any pleural effusion or pneumonia.  Patient had to be transferred to ICU on 7/2 due to increased workload of breathing.  Now off BiPAP.Started on bronchodilators, continue steroids  Acute somnolence/altered mental status: Likely secondary to substance abuse.  UDS  positive for cocaine, benzodiazepines.    CT head did not show any acute intracranial findings.  Now alert and oriented.  Elevated troponin/acute systolic congestive heart failure: Troponin remarkably elevated after initial level of 100.  Started on heparin drip. Consulted cardiology though this is most likely secondary to supply demand ischemia from cocaine use. Echo showed new ejection fraction of 45%.,  Right wall motion abnormality.  Started on heparin, aspirin, Plavix.  Cardiology planning for left heart cath as an inpatient versus outpatient.  Mild elevated BNP.  Started on low-dose Lasix,crestor  Leukocytosis/suspected UTI: Blood cultures have not shown any growth.  Her urine was grossly turbid today.  Will check urine culture.  Started on ceftriaxone.  Polysubstance abuse: UDS positive for cocaine ,benzodiazepines.  Consult for sedation.  COPD exacerbation: Started on azithromycin, bronchodilators, prednisone continue mucolytic's  Diabetes type 2: Continue sliding scale  insulin.  Monitor blood sugars  Hyponatremia: Improved 134.  Obesity: BMI 38.6  Deconditioning/debility: We will consult PT/OT        DVT prophylaxis:heparin injection 5,000 Units Start: 01/04/22 2200  IV heparin     Code Status: Full Code  Family Communication: Called and discussed with husband on phone on 7/2  Patient status:Inpatient  Patient is from :Home  Anticipated discharge FY:[facility]  Estimated DC date: In 1 to 2 days   Consultants: Cardiology  Procedures:None  Antimicrobials:  Anti-infectives (From admission, onward)    Start     Dose/Rate Route Frequency Ordered Stop   01/03/22 2245  azithromycin (ZITHROMAX) tablet 500 mg       See Hyperspace for full Linked Orders Report.  500 mg Oral Daily 01/02/22 2233 01/07/22 2159   01/02/22 2233  azithromycin (ZITHROMAX) 500 mg in sodium chloride 0.9 % 250 mL IVPB       See Hyperspace for full Linked Orders Report.   500 mg 250 mL/hr over 60  Minutes Intravenous Every 24 hours 01/02/22 2233 01/03/22 0040       Subjective: Patient seen and examined at the bedside this morning.  Hemodynamically stable.  On nasal cannula.  Alert and oriented.  Appears weak.  Counseled for the drug cessation  objective: Vitals:   01/04/22 2354 01/05/22 0425 01/05/22 0734 01/05/22 0738  BP: 136/87 (!) 156/87    Pulse: 96 94  91  Resp: 17 15  14   Temp: 98.1 F (36.7 C) 98 F (36.7 C)    TempSrc: Oral Oral    SpO2: 100% 100% 100% 100%  Weight:      Height:        Intake/Output Summary (Last 24 hours) at 01/05/2022 0758 Last data filed at 01/04/2022 2000 Gross per 24 hour  Intake 168.84 ml  Output --  Net 168.84 ml   Filed Weights   01/02/22 0501  Weight: 102 kg    Examination:  General exam: Overall comfortable, not in distress, weak HEENT: PERRL Respiratory system: Mild bilateral expiratory wheezing Cardiovascular system: S1 & S2 heard, RRR.  Gastrointestinal system: Abdomen is nondistended, soft and nontender. Central nervous system: Alert and oriented Extremities: No edema, no clubbing ,no cyanosis Skin: No rashes, no ulcers,no icterus     Data Reviewed: I have personally reviewed following labs and imaging studies  CBC: Recent Labs  Lab 01/02/22 0451 01/03/22 0332 01/03/22 0913 01/04/22 0007 01/05/22 0045  WBC 26.0* 20.1* 18.4* 22.9* 22.2*  NEUTROABS 20.9*  --   --   --   --   HGB 12.3 11.7* 11.2* 10.9* 11.1*  HCT 35.6* 33.3* 33.7* 32.0* 32.6*  MCV 92.7 92.5 94.4 93.6 94.2  PLT 339 250 250 267 762   Basic Metabolic Panel: Recent Labs  Lab 01/02/22 2256 01/03/22 0332 01/03/22 0913 01/04/22 0007 01/05/22 0045  NA 129* 131* 134* 134* 136  K 4.4 4.2 4.3 4.0 4.2  CL 92* 96* 98 97* 97*  CO2 24 24 23 27 29   GLUCOSE 199* 167* 174* 176* 198*  BUN 15 11 12 16 22   CREATININE 0.59 0.83 0.70 0.63 0.63  CALCIUM 10.0 9.6 9.6 9.8 10.5*  MG  --   --   --   --  1.3*     Recent Results (from the past 240 hour(s))   Culture, blood (Routine X 2) w Reflex to ID Panel     Status: None (Preliminary result)   Collection Time: 01/02/22  5:09 PM   Specimen: BLOOD RIGHT HAND  Result Value Ref Range Status   Specimen Description   Final    BLOOD RIGHT HAND BOTTLES DRAWN AEROBIC AND ANAEROBIC   Special Requests Blood Culture adequate volume  Final   Culture   Final    NO GROWTH 3 DAYS Performed at [facility], [address], [place], [place] [address]    Report Status PENDING  Incomplete  Culture, blood (Routine X 2) w Reflex to ID Panel     Status: None (Preliminary result)   Collection Time: 01/02/22  5:19 PM   Specimen: BLOOD LEFT HAND  Result Value Ref Range Status   Specimen Description   Final    BLOOD LEFT HAND BOTTLES DRAWN AEROBIC AND ANAEROBIC  Special Requests Blood Culture adequate volume  Final   Culture   Final    NO GROWTH 3 DAYS Performed at [facility], [address], [place], [place] [address]    Report Status PENDING  Incomplete     Radiology Studies: ECHOCARDIOGRAM COMPLETE  Result Date: 01/03/2022    ECHOCARDIOGRAM REPORT   Patient Name:   TAMBERLY POMPLUN Melhorn Date of Exam: 01/03/2022 Medical Rec #:  [id number]             Height:       64.0 in Accession #:    [id number]            Weight:       224.9 lb Date of Birth:  May 02, 1956            BSA:          2.056 m Patient Age:    27 years              BP:           125/75 mmHg Patient Gender: F                     HR:           109 bpm. Exam Location:  Inpatient Procedure: 2D Echo Indications:    NSTEMI  History:        Patient has prior history of Echocardiogram examinations, most                 recent 06/14/2019. COPD; Risk Factors:Hypertension,                 Dyslipidemia, Diabetes, polysubstance abuse and Current Smoker.  Sonographer:    Johny Chess RDCS Referring Phys: [phone] JACOB J STINSON  Sonographer Comments: Image acquisition challenging due to respiratory motion and Image acquisition challenging due to COPD.  IMPRESSIONS  1. Septal and apical hypokinesis . Left ventricular ejection fraction, by estimation, is 40 to 45%. The left ventricle has mildly decreased function. The left ventricle demonstrates regional wall motion abnormalities (see scoring diagram/findings for description). The left ventricular internal cavity size was mildly dilated. Left ventricular diastolic parameters are indeterminate.  2. Right ventricular systolic function is normal. The right ventricular size is normal.  3. The mitral valve is normal in structure. No evidence of mitral valve regurgitation.  4. The aortic valve is tricuspid. There is mild calcification of the aortic valve. Aortic valve regurgitation is not visualized. Aortic valve sclerosis is present, with no evidence of aortic valve stenosis. FINDINGS  Left Ventricle: Septal and apical hypokinesis. Left ventricular ejection fraction, by estimation, is 40 to 45%. The left ventricle has mildly decreased function. The left ventricle demonstrates regional wall motion abnormalities. The left ventricular internal cavity size was mildly dilated. There is no left ventricular hypertrophy. Left ventricular diastolic parameters are indeterminate. Right Ventricle: The right ventricular size is normal. No increase in right ventricular wall thickness. Right ventricular systolic function is normal. Left Atrium: Left atrial size was normal in size. Right Atrium: Right atrial size was normal in size. Pericardium: There is no evidence of pericardial effusion. Mitral Valve: The mitral valve is normal in structure. No evidence of mitral valve regurgitation. Tricuspid Valve: The tricuspid valve is not well visualized. Tricuspid valve regurgitation is mild. Aortic Valve: The aortic valve is tricuspid. There is mild calcification of the aortic valve. Aortic valve regurgitation is not visualized. Aortic valve sclerosis is present, with no evidence of aortic  valve stenosis. Pulmonic Valve: The pulmonic valve was  not well visualized. Pulmonic valve regurgitation is not visualized. Aorta: The aortic root is normal in size and structure. IAS/Shunts: The interatrial septum was not well visualized.  LEFT VENTRICLE PLAX 2D LVIDd:         3.50 cm LVIDs:         2.30 cm LV PW:         1.10 cm LV IVS:        1.00 cm LVOT diam:     1.80 cm LV SV:         39 LV SV Index:   19 LVOT Area:     2.54 cm  LV Volumes (MOD) LV vol d, MOD A4C: 75.4 ml LV vol s, MOD A4C: 42.0 ml LV SV MOD A4C:     75.4 ml RIGHT VENTRICLE             IVC RV S prime:     12.20 cm/s  IVC diam: 2.30 cm TAPSE (M-mode): 2.4 cm LEFT ATRIUM             Index        RIGHT ATRIUM           Index LA diam:        3.10 cm 1.51 cm/m   RA Area:     13.50 cm LA Vol (A2C):   51.4 ml 25.00 ml/m  RA Volume:   31.20 ml  15.17 ml/m LA Vol (A4C):   46.5 ml 22.61 ml/m LA Biplane Vol: 50.9 ml 24.75 ml/m  AORTIC VALVE LVOT Vmax:   94.70 cm/s LVOT Vmean:  63.600 cm/s LVOT VTI:    0.153 m  AORTA Ao Root diam: 2.90 cm Ao Asc diam:  3.40 cm  SHUNTS Systemic VTI:  0.15 m Systemic Diam: 1.80 cm Jenkins Rouge MD Electronically signed by Jenkins Rouge MD Signature Date/Time: 01/03/2022/6:36:16 PM    Final    DG Chest Port 1 View  Result Date: 01/03/2022 CLINICAL DATA:  66 year old female with history of acute respiratory distress. EXAM: PORTABLE CHEST 1 VIEW COMPARISON:  Chest x-ray 01/02/2022. FINDINGS: There is cephalization of the pulmonary vasculature and slight indistinctness of the interstitial markings suggestive of mild pulmonary edema. No definite pleural effusions. No pneumothorax. No evidence heart size is mildly enlarged. Upper mediastinal contours are within normal limits. Atherosclerotic calcifications in the thoracic aorta. IMPRESSION: 1. The appearance the chest suggests mild congestive heart failure, as above. Electronically Signed   By: Vinnie Langton M.D.   On: 01/03/2022 08:20    Scheduled Meds:  arformoterol  15 mcg Nebulization BID   aspirin EC  81 mg Oral  Daily   azithromycin  500 mg Oral Daily   budesonide (PULMICORT) nebulizer solution  0.25 mg Nebulization BID   Chlorhexidine Gluconate Cloth  6 each Topical Daily   clopidogrel  75 mg Oral Daily   folic acid  1 mg Oral Daily   heparin injection (subcutaneous)  5,000 Units Subcutaneous Q8H   insulin aspart  0-20 Units Subcutaneous Q4H   LORazepam  0-4 mg Intravenous Q12H   multivitamin with minerals  1 tablet Oral Daily   pantoprazole (PROTONIX) IV  40 mg Intravenous QHS   predniSONE  60 mg Oral BID WC   revefenacin  175 mcg Nebulization Daily   thiamine  100 mg Oral Daily   Or   thiamine  100 mg Intravenous Daily   Continuous Infusions:  magnesium sulfate bolus  IVPB       LOS: 3 days   Shelly Coss, MD [facility] P7/10/2021, 7:58 AM

## 2022-01-05 NOTE — Telephone Encounter (Signed)
Please schedule patient for hospital follow up with Dr. Melvyn Novas or APP for COPD exacerbation in 2-3 weeks.  Thanks, JD

## 2022-01-05 NOTE — Progress Notes (Signed)
Progress Note  Patient Name: [name] Date of Encounter: 01/05/2022  Primary Cardiologist: Carlyle Dolly, MD   Subjective   Patient seen and examined at her bedside.  She was sitting up in the bed when I arrived.  She reports shortness of breath.  Inpatient Medications    Scheduled Meds:  arformoterol  15 mcg Nebulization BID   aspirin EC  81 mg Oral Daily   azithromycin  500 mg Oral Daily   budesonide (PULMICORT) nebulizer solution  0.25 mg Nebulization BID   Chlorhexidine Gluconate Cloth  6 each Topical Daily   clopidogrel  75 mg Oral Daily   folic acid  1 mg Oral Daily   heparin injection (subcutaneous)  5,000 Units Subcutaneous Q8H   insulin aspart  0-20 Units Subcutaneous Q4H   LORazepam  0-4 mg Intravenous Q12H   multivitamin with minerals  1 tablet Oral Daily   pantoprazole (PROTONIX) IV  40 mg Intravenous QHS   predniSONE  60 mg Oral BID WC   revefenacin  175 mcg Nebulization Daily   thiamine  100 mg Oral Daily   Or   thiamine  100 mg Intravenous Daily   Continuous Infusions:  cefTRIAXone (ROCEPHIN)  IV     magnesium sulfate bolus IVPB     PRN Meds: acetaminophen, albuterol, haloperidol lactate, hydrALAZINE, lip balm, LORazepam **OR** LORazepam, ondansetron **OR** ondansetron (ZOFRAN) IV   Vital Signs    Vitals:   01/05/22 0425 01/05/22 0734 01/05/22 0738 01/05/22 0900  BP: (!) 156/87   125/77  Pulse: 94  91 86  Resp: 15  14 18   Temp: 98 F (36.7 C)   98 F (36.7 C)  TempSrc: Oral   Oral  SpO2: 100% 100% 100% 100%  Weight:      Height:        Intake/Output Summary (Last 24 hours) at 01/05/2022 0946 Last data filed at 01/04/2022 2000 Gross per 24 hour  Intake 142.88 ml  Output --  Net 142.88 ml   Filed Weights   01/02/22 0501  Weight: 102 kg    Telemetry    Sinus rhythm- Personally Reviewed  ECG    None today- Personally Reviewed  Physical Exam    General: Comfortable Head: Atraumatic, normal size  Eyes: PEERLA, EOMI   Neck: Supple, normal JVD Cardiac: Normal S1, S2; RRR; no murmurs, rubs, or gallops Lungs: Diffuse mild wheezing bilaterally Abd: Soft, nontender, no hepatomegaly  Ext: warm, no edema Musculoskeletal: No deformities, BUE and BLE strength normal and equal Skin: Warm and dry, with lower extremity bruising, no rashes   Neuro: Alert and oriented to person, place, time, and situation, CNII-XII grossly intact, no focal deficits  Psych: Normal mood and affect   Labs    Chemistry Recent Labs  Lab 01/02/22 0451 01/02/22 1638 01/03/22 0913 01/04/22 0007 01/05/22 0045  NA 125*   < > 134* 134* 136  K 4.3   < > 4.3 4.0 4.2  CL 88*   < > 98 97* 97*  CO2 24   < > 23 27 29   GLUCOSE 162*   < > 174* 176* 198*  BUN 13   < > 12 16 22   CREATININE 0.64   < > 0.70 0.63 0.63  CALCIUM 10.6*   < > 9.6 9.8 10.5*  PROT 7.5  --   --   --   --   ALBUMIN 4.0  --   --   --   --   AST  32  --   --   --   --   ALT 27  --   --   --   --   ALKPHOS 75  --   --   --   --   BILITOT 0.3  --   --   --   --   GFRNONAA >60   < > >60 >60 >60  ANIONGAP 13   < > 13 10 10    < > = values in this interval not displayed.     Hematology Recent Labs  Lab 01/03/22 0913 01/04/22 0007 01/05/22 0045  WBC 18.4* 22.9* 22.2*  RBC 3.57* 3.42* 3.46*  HGB 11.2* 10.9* 11.1*  HCT 33.7* 32.0* 32.6*  MCV 94.4 93.6 94.2  MCH 31.4 31.9 32.1  MCHC 33.2 34.1 34.0  RDW 12.5 12.6 12.8  PLT 250 267 248    Cardiac EnzymesNo results for input(s): "TROPONINI" in the last 168 hours. No results for input(s): "TROPIPOC" in the last 168 hours.   BNP Recent Labs  Lab 01/03/22 0913 01/05/22 0045  BNP 335.6* 130.1*     DDimer No results for input(s): "DDIMER" in the last 168 hours.   Radiology    ECHOCARDIOGRAM COMPLETE  Result Date: 01/03/2022    ECHOCARDIOGRAM REPORT   Patient Name:   [name] Debold Date of Exam: 01/03/2022 Medical Rec #:  [id number]             Height:       64.0 in Accession #:    [id number]             Weight:       224.9 lb Date of Birth:  02/06/1956            BSA:          2.056 m Patient Age:    66 years              BP:           125/75 mmHg Patient Gender: F                     HR:           109 bpm. Exam Location:  Inpatient Procedure: 2D Echo Indications:    NSTEMI  History:        Patient has prior history of Echocardiogram examinations, most                 recent 06/14/2019. COPD; Risk Factors:Hypertension,                 Dyslipidemia, Diabetes, polysubstance abuse and Current Smoker.  Sonographer:    Johny Chess RDCS Referring Phys: [phone] JACOB J STINSON  Sonographer Comments: Image acquisition challenging due to respiratory motion and Image acquisition challenging due to COPD. IMPRESSIONS  1. Septal and apical hypokinesis . Left ventricular ejection fraction, by estimation, is 40 to 45%. The left ventricle has mildly decreased function. The left ventricle demonstrates regional wall motion abnormalities (see scoring diagram/findings for description). The left ventricular internal cavity size was mildly dilated. Left ventricular diastolic parameters are indeterminate.  2. Right ventricular systolic function is normal. The right ventricular size is normal.  3. The mitral valve is normal in structure. No evidence of mitral valve regurgitation.  4. The aortic valve is tricuspid. There is mild calcification of the aortic valve. Aortic valve regurgitation is not visualized. Aortic valve sclerosis is present, with no evidence of aortic  valve stenosis. FINDINGS  Left Ventricle: Septal and apical hypokinesis. Left ventricular ejection fraction, by estimation, is 40 to 45%. The left ventricle has mildly decreased function. The left ventricle demonstrates regional wall motion abnormalities. The left ventricular internal cavity size was mildly dilated. There is no left ventricular hypertrophy. Left ventricular diastolic parameters are indeterminate. Right Ventricle: The right ventricular size is normal. No  increase in right ventricular wall thickness. Right ventricular systolic function is normal. Left Atrium: Left atrial size was normal in size. Right Atrium: Right atrial size was normal in size. Pericardium: There is no evidence of pericardial effusion. Mitral Valve: The mitral valve is normal in structure. No evidence of mitral valve regurgitation. Tricuspid Valve: The tricuspid valve is not well visualized. Tricuspid valve regurgitation is mild. Aortic Valve: The aortic valve is tricuspid. There is mild calcification of the aortic valve. Aortic valve regurgitation is not visualized. Aortic valve sclerosis is present, with no evidence of aortic valve stenosis. Pulmonic Valve: The pulmonic valve was not well visualized. Pulmonic valve regurgitation is not visualized. Aorta: The aortic root is normal in size and structure. IAS/Shunts: The interatrial septum was not well visualized.  LEFT VENTRICLE PLAX 2D LVIDd:         3.50 cm LVIDs:         2.30 cm LV PW:         1.10 cm LV IVS:        1.00 cm LVOT diam:     1.80 cm LV SV:         39 LV SV Index:   19 LVOT Area:     2.54 cm  LV Volumes (MOD) LV vol d, MOD A4C: 75.4 ml LV vol s, MOD A4C: 42.0 ml LV SV MOD A4C:     75.4 ml RIGHT VENTRICLE             IVC RV S prime:     12.20 cm/s  IVC diam: 2.30 cm TAPSE (M-mode): 2.4 cm LEFT ATRIUM             Index        RIGHT ATRIUM           Index LA diam:        3.10 cm 1.51 cm/m   RA Area:     13.50 cm LA Vol (A2C):   51.4 ml 25.00 ml/m  RA Volume:   31.20 ml  15.17 ml/m LA Vol (A4C):   46.5 ml 22.61 ml/m LA Biplane Vol: 50.9 ml 24.75 ml/m  AORTIC VALVE LVOT Vmax:   94.70 cm/s LVOT Vmean:  63.600 cm/s LVOT VTI:    0.153 m  AORTA Ao Root diam: 2.90 cm Ao Asc diam:  3.40 cm  SHUNTS Systemic VTI:  0.15 m Systemic Diam: 1.80 cm Jenkins Rouge MD Electronically signed by Jenkins Rouge MD Signature Date/Time: 01/03/2022/6:36:16 PM    Final     Cardiac Studies   TTE 01/03/2022 IMPRESSIONS   1. Septal and apical hypokinesis .  Left ventricular ejection fraction, by  estimation, is 40 to 45%. The left ventricle has mildly decreased  function. The left ventricle demonstrates regional wall motion  abnormalities (see scoring diagram/findings for  description). The left ventricular internal cavity size was mildly  dilated. Left ventricular diastolic parameters are indeterminate.   2. Right ventricular systolic function is normal. The right ventricular  size is normal.   3. The mitral valve is normal in structure. No evidence of mitral valve  regurgitation.   4. The aortic valve is tricuspid. There is mild calcification of the  aortic valve. Aortic valve regurgitation is not visualized. Aortic valve  sclerosis is present, with no evidence of aortic valve stenosis.   FINDINGS   Left Ventricle: Septal and apical hypokinesis. Left ventricular ejection  fraction, by estimation, is 40 to 45%. The left ventricle has mildly  decreased function. The left ventricle demonstrates regional wall motion  abnormalities. The left ventricular  internal cavity size was mildly dilated. There is no left ventricular  hypertrophy. Left ventricular diastolic parameters are indeterminate.   Right Ventricle: The right ventricular size is normal. No increase in  right ventricular wall thickness. Right ventricular systolic function is  normal.   Left Atrium: Left atrial size was normal in size.   Right Atrium: Right atrial size was normal in size.   Pericardium: There is no evidence of pericardial effusion.   Mitral Valve: The mitral valve is normal in structure. No evidence of  mitral valve regurgitation.   Tricuspid Valve: The tricuspid valve is not well visualized. Tricuspid  valve regurgitation is mild.   Aortic Valve: The aortic valve is tricuspid. There is mild calcification  of the aortic valve. Aortic valve regurgitation is not visualized. Aortic  valve sclerosis is present, with no evidence of aortic valve stenosis.    Pulmonic Valve: The pulmonic valve was not well visualized. Pulmonic valve  regurgitation is not visualized.   Aorta: The aortic root is normal in size and structure.   IAS/Shunts: The interatrial septum was not well visualized.       Patient Profile     66 y.o. female of polysubstance abuse with delerium and concerns for toxic ingestion complicated by NSTEMI with newly diagnosed cardiomyopathy    Assessment & Plan    NSTEMI Dilated cardiomyopathy with 45% and wall motion abnormalities Hypertensive heart disease Active cocaine user-last positive test January 02, 2022 Hyponatremia-appears to be chronic improving Diabetes mellitus  Clinically she appears to be improving but still does have some shortness of breath.  Her elevated troponin earlier in her visit could likely have been multifactorial.  But she does have risk factors for coronary heart disease.  She has been treated medically during her hospitalization.  She completed 48 hours of heparin.  She is now on aspirin and Plavix.  We will start Crestor 10 mg.  She has had statins in the past she was on simvastatin with no problems. She appears to be tolerating her medical therapy-agree to monitor and likely outpatient left heart catheterization.  He was setting for new onset cardiomyopathy guideline medical therapy is indicated.  She may benefit from starting an Entresto.  We discussed especially the use of beta-blocker in the setting of her cocaine use-for now she has no intentions of going back to cocaine use.  If she follow-up in the outpatient we will cautiously start her on low-dose carvedilol.  Current smoker-smoking cessation advised.  Active cocaine user-she plans to quit.  She may benefit from social work evaluation for outpatient behavioral health follow-ups.  Shortness of breath-multifactorial in the setting of her COPD as well as she may benefit from a low-dose of Lasix.  Lasix 40 will be given today.  Electrolytes  imbalance-magnesium is 1.3.  This has been replaced by the primary team.  Continue to monitor this.  Keep mag above 2 and K above 4.   Discussed plan of care with the patient and her bedside nurse.  For questions or updates, please contact [place] Please consult [email or web address] for contact info under Cardiology/STEMI.      Signed, Berniece Salines, DO  01/05/2022, 9:46 AM

## 2022-01-05 NOTE — Evaluation (Signed)
Physical Therapy Evaluation & Discharge Patient Details Name: [name] MRN: [id number] DOB: June 09, 1956 Today's Date: 01/05/2022  History of Present Illness  Pt is a 66 y.o. female admitted 01/02/22 via EMS from home with delirium and agitation with concern for toxic ingestion (cocaine +, ETOH, and prescription drugs) complicated by NSTEMI with newly diagnosed cardiomyopathy. PMH includes polysubstance abuse, COPD, HTN, chronic pain, DM, bipolar.   Clinical Impression  Patient evaluated by Physical Therapy with no further acute PT needs identified. PTA, pt typically indep with intermittent RW use, lives with spouse who assists with ADL/iADLs as needed. Today, pt moving fairly well, requesting use of RW for added stability; ambulation distance limited by c/o fatigue and SOB. Educ re: activity recommendations, fall risk reduction, DME needs. All education has been completed and the patient has no further questions. Acute PT is signing off. Thank you for this referral.    SpO2 97% on 4L O2 [place] (pt reports wearing 4L baseline)  HR 97   Recommendations for follow up therapy are one component of a multi-disciplinary discharge planning process, led by the attending physician.  Recommendations may be updated based on patient status, additional functional criteria and insurance authorization.  Follow Up Recommendations No PT follow up      Assistance Recommended at Discharge Intermittent Supervision/Assistance  Patient can return home with the following  A little help with bathing/dressing/bathroom;Assistance with cooking/housework;Assist for transportation;Help with stairs or ramp for entrance    Equipment Recommendations None recommended by PT  Recommendations for Other Services   Mobility Specialist          Precautions / Restrictions Precautions Precautions: Fall;Other (comment) Precaution Comments: wears 4L O2 baseline Restrictions Weight Bearing Restrictions: No       Mobility  Bed Mobility Overal bed mobility: Independent                  Transfers Overall transfer level: Modified independent Equipment used: None, Rolling walker (2 wheels) Transfers: Sit to/from Stand Sit to Stand: Modified independent (Device/Increase time)           General transfer comment: pt transferring self from bed<>BSC without DME, mod indep for increased time; additional sit<>stand with RW, mod indep    Ambulation/Gait Ambulation/Gait assistance: Supervision Gait Distance (Feet): 0 Feet Assistive device: Rolling walker (2 wheels) Gait Pattern/deviations: Step-through pattern, Decreased stride length, Trunk flexed Gait velocity: Decreased     General Gait Details: pt requesting RW use for stability; slow, steady gait with RW, supervision for safety and lines with first time ambulating; further distance limited by c/o fatigue and SOB; 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    Modified Rankin (Stroke Patients Only)       Balance Overall balance assessment: Needs assistance   Sitting balance-Leahy Scale: Good Sitting balance - Comments: indep with pericare sitting on BSC   Standing balance support: No upper extremity supported, During functional activity Standing balance-Leahy Scale: Fair Standing balance comment: can ambulate without UE support, balance not significantly challenged                             Pertinent Vitals/Pain Pain Assessment Pain Assessment: No/denies pain Pain Intervention(s): Monitored during session    Home Living Family/patient expects to be discharged to:: Private residence Living Arrangements: Spouse/significant other Available Help at Discharge: Family;Available 24 hours/day Type of Home: Mobile home Home Access: Stairs  to enter Entrance Stairs-Rails: Right;Left;Can reach both Entrance Stairs-Number of Steps: 2   Home Layout: One level Home Equipment: Furniture conservator/restorer (2 wheels) Additional Comments: wears 4L O2 baseline    Prior Function Prior Level of Function : Independent/Modified Independent             Mobility Comments: Typically indep without DME, intermittent use of RW if needed. patient nor husband drive, have family or transportation assist ADLs Comments: Typically sponge bathes at sink, or husband assists with stepping into tub     Hand Dominance        Extremity/Trunk Assessment   Upper Extremity Assessment Upper Extremity Assessment: Generalized weakness    Lower Extremity Assessment Lower Extremity Assessment: Generalized weakness       Communication   Communication: No difficulties  Cognition Arousal/Alertness: Awake/alert Behavior During Therapy: WFL for tasks assessed/performed, Flat affect Overall Cognitive Status: Within Functional Limits for tasks assessed                                 General Comments: WFL for simple tasks, not formally assessed        General Comments General comments (skin integrity, edema, etc.): SpO2 97% on 4L O2 [place], HR 97 post-ambulation    Exercises     Assessment/Plan    PT Assessment Patient does not need any further PT services  PT Problem List         PT Treatment Interventions      PT Goals (Current goals can be found in the Care Plan section)  Acute Rehab PT Goals PT Goal Formulation: All assessment and education complete, DC therapy    Frequency       Co-evaluation               AM-PAC PT "6 Clicks" Mobility  Outcome Measure Help needed turning from your back to your side while in a flat bed without using bedrails?: None Help needed moving from lying on your back to sitting on the side of a flat bed without using bedrails?: None Help needed moving to and from a bed to a chair (including a wheelchair)?: None Help needed standing up from a chair using your arms (e.g., wheelchair or bedside chair)?: None Help needed to walk in  hospital room?: A Little Help needed climbing 3-5 steps with a railing? : A Little 6 Click Score: 22    End of Session Equipment Utilized During Treatment: Gait belt;Oxygen Activity Tolerance: Patient tolerated treatment well Patient left: in chair;with call bell/phone within reach Nurse Communication: Mobility status PT Visit Diagnosis: Other abnormalities of gait and mobility (R26.89)    Time: 6834-1962 PT Time Calculation (min) (ACUTE ONLY): 21 min   Charges:   PT Evaluation $PT Eval Moderate Complexity: Advance, PT, DPT Acute Rehabilitation Services  Personal: [place] [facility] Office: [place] 01/05/2022, 4:46 PM

## 2022-01-05 NOTE — Progress Notes (Signed)
Patient resting comfortably on  this AM with no respiratory distress noted.  Bipap currently not indicated.  Will continue to monitor.

## 2022-01-05 NOTE — Progress Notes (Signed)
NAME:  [name], MRN:  [id number], DOB:  21-Nov-1955, LOS: 3 ADMISSION DATE:  01/02/2022, CONSULTATION DATE:  01/03/2022 REFERRING MD: Tawanna Solo - [facility] CHIEF COMPLAINT:  ARF, AMS  History of Present Illness:   66 year old woman who presented to [facility] ED 7/1 with acute respiratory distress, AMS with delirium/agitation after crack cocaine use. PMHx significant for HTN, HLD, T2DM, CHF, bipolar disorder, chronic hypoxemic respiratory failure with COPD (on 4L HOT), polysubstance abuse (EtOH, tobacco, prescription drug misuse, crack cocaine).  In ED, hypertensive with SBP 200s. Labs were notable for WBC 26, Na 125, Cr 0.64 (baseline), LA 2.1, elevated CK and elevated troponins (200 to 2800, likely demand-associated in the setting of cocaine use). UDS+ cocaine, benzos. CXR demonstrated vascular congestion and pulmonary edema. CT Head negative. Empiric antibiotics and steroid course initiated. Given elevation in troponins, patient transferred to [facility] with plan for Cardiology consult.  On arrival to [facility] (SDU), patient noted to be tachypneic with labored breathing/lethargy prompting Rapid Response and BiPAP initiation.  PCCM consulted for ICU admission.  Pertinent Medical History:   Past Medical History:  Diagnosis Date   Anemia    Anxiety    Arthritis    Bipolar 1 disorder (HCC)    CHF (congestive heart failure) (HCC)    COPD (chronic obstructive pulmonary disease) (HCC)    Depression    Diabetes mellitus without complication (HCC)    Dyspnea    Emphysema of lung (HCC)    GERD (gastroesophageal reflux disease)    Headache    migraines   History of hiatal hernia    History of kidney stones    Hyperlipidemia    Hypertension    Neuromuscular disorder (HCC)    neuropathy   On home oxygen therapy    Pneumonia 2015, 2019   Tracheomalacia    Vaginal Pap smear, abnormal    Significant Hospital Events: Including procedures, antibiotic start and stop dates in addition to other  pertinent events   7/1 Presented to [facility] ED with AMS (delirium, agitation), respiratory distress. Initial plan for admission to [facility] for delirium management post-crack cocaine use. Trops elevated. Transfer to [facility] for Cards eval. Heparin gtt started. Steroids/antibiotics started. BiPAP initiated. 7/2 - Tachypneic and lethargic on the floor. Rapid Response called. ABG obtained, patient somnolent and placed on BiPAP. Transferred to ICU, PCCM consulted. 7/3 Improved mental status and respiratory status. BiPAP removed, 4LNC. Passed bedside swallow.  Interim History / Subjective:  No acute issues Objective:  Blood pressure 125/77, pulse 86, temperature 98 F (36.7 C), temperature source Oral, resp. rate 18, height 5\' 4"  (1.626 m), weight 102 kg, SpO2 100 %.        Intake/Output Summary (Last 24 hours) at 01/05/2022 0933 Last data filed at 01/04/2022 2000 Gross per 24 hour  Intake 142.88 ml  Output --  Net 142.88 ml   Filed Weights   01/02/22 0501  Weight: 102 kg   Physical Examination: General: Obese female who appears older than stated age 27: MM pink/moist Neuro: Grossly intact without focal defect CV: Heart sounds are distant PULM: Diminished throughout  GI: soft, obese bsx4 active  GU: Voids Extremities: warm/dry, 2+ pitting edema  Skin: no rashes or lesions   Resolved Hospital Problem List:   Acute metabolic encephalopathy related to cocaine use  Assessment & Plan:   Acute-on-chronic hypoxemic respiratory failure, now BIPAP-dependent  AECOPD She is home O2 dependent Wean FiO2 as able Transition steroids Pulmonary toilet Continue empirical Zithromax Intermittent chest x-ray  NSTEMI, apical Troponin 200 to 2800, initially thought to be demand-associated in the setting of cocaine use (?vasospasm, ?plaque rupture). EKG without significant changes. Echo 7/2 showing septal/apical hypokinesis, LVEF 40-45%, +RWMAs; concern for new apical injury   Appreciate cardiology  input Heparin completed Consider low-dose Lasix I suspect when she gets home she will be back into the cocaine    Polysubstance abuse Cocaine abuse  Tobacco abuse  History of alcohol use  Appears to be improved on 01/06/1999 Continue to monitor for signs and symptoms of withdrawal May be  Able to stop CIWA protocol       Best Practice (right click and "Reselect all SmartList Selections" daily)   Diet/type: NPO with sips/chips, ADAT pending continued improved mental status DVT prophylaxis: systemic heparin, transition to [facility] 7/3PM GI prophylaxis: N/A Lines: N/A Foley:  N/A Code Status:  full code Last date of multidisciplinary goals of care discussion [husband updated at bedside 7/3]  Nearing readiness for floor/progressive transfer, pending continued improved  Critical care time:na   Richardson Landry Maire Govan ACNP Acute Care Nurse Practitioner [place] Please consult Amion 01/05/2022, 9:33 AM

## 2022-01-06 ENCOUNTER — Other Ambulatory Visit (HOSPITAL_COMMUNITY): Payer: Self-pay

## 2022-01-06 DIAGNOSIS — R410 Disorientation, unspecified: Secondary | ICD-10-CM | POA: Diagnosis not present

## 2022-01-06 LAB — BASIC METABOLIC PANEL
Anion gap: 13 (ref 5–15)
BUN: 20 mg/dL (ref 8–23)
CO2: 32 mmol/L (ref 22–32)
Calcium: 10.8 mg/dL — ABNORMAL HIGH (ref 8.9–10.3)
Chloride: 94 mmol/L — ABNORMAL LOW (ref 98–111)
Creatinine, Ser: 0.59 mg/dL (ref 0.44–1.00)
GFR, Estimated: >60 mL/min (ref >60)
Glucose, Bld: 135 mg/dL — ABNORMAL HIGH (ref 70–99)
Potassium: 3.8 mmol/L (ref 3.5–5.1)
Sodium: 139 mmol/L (ref 135–145)

## 2022-01-06 LAB — URINE CULTURE

## 2022-01-06 LAB — CBC
HCT: 32.8 % — ABNORMAL LOW (ref 36.0–46.0)
Hemoglobin: 10.8 g/dL — ABNORMAL LOW (ref 12.0–15.0)
MCH: 31.6 pg (ref 26.0–34.0)
MCHC: 32.9 g/dL (ref 30.0–36.0)
MCV: 95.9 fL (ref 80.0–100.0)
Platelets: 243 10*3/uL (ref 150–400)
RBC: 3.42 MIL/uL — ABNORMAL LOW (ref 3.87–5.11)
RDW: 12.9 % (ref 11.5–15.5)
WBC: 18.6 10*3/uL — ABNORMAL HIGH (ref 4.0–10.5)
nRBC: 0 % (ref 0.0–0.2)

## 2022-01-06 LAB — MAGNESIUM: Magnesium: 1.8 mg/dL (ref 1.7–2.4)

## 2022-01-06 LAB — GLUCOSE, CAPILLARY
Glucose-Capillary: 101 mg/dL — ABNORMAL HIGH (ref 70–99)
Glucose-Capillary: 131 mg/dL — ABNORMAL HIGH (ref 70–99)
Glucose-Capillary: 133 mg/dL — ABNORMAL HIGH (ref 70–99)
Glucose-Capillary: 155 mg/dL — ABNORMAL HIGH (ref 70–99)
Glucose-Capillary: 243 mg/dL — ABNORMAL HIGH (ref 70–99)

## 2022-01-06 MED ORDER — BREZTRI AEROSPHERE 160-9-4.8 MCG/ACT IN AERO
2.0000 | INHALATION_SPRAY | Freq: Two times a day (BID) | RESPIRATORY_TRACT | 1 refills | Status: DC
  Filled 2022-01-06: qty 10.7, fill #0
  Filled 2022-01-06: qty 10.7, 30d supply, fill #0

## 2022-01-06 MED ORDER — ROSUVASTATIN CALCIUM 10 MG PO TABS
10.0000 mg | ORAL_TABLET | Freq: Every day | ORAL | 1 refills | Status: DC
  Filled 2022-01-06: qty 30, 30d supply, fill #0

## 2022-01-06 MED ORDER — GABAPENTIN 800 MG PO TABS: 800.0000 mg | ORAL_TABLET | Freq: Three times a day (TID) | ORAL | Status: DC | PRN

## 2022-01-06 MED ORDER — TORSEMIDE 20 MG PO TABS
20.0000 mg | ORAL_TABLET | Freq: Every day | ORAL | 0 refills | Status: DC | PRN
  Filled 2022-01-06: qty 30, 30d supply, fill #0

## 2022-01-06 MED ORDER — ASPIRIN 81 MG PO TBEC
81.0000 mg | DELAYED_RELEASE_TABLET | Freq: Every day | ORAL | 2 refills | Status: DC
  Filled 2022-01-06: qty 30, 30d supply, fill #0

## 2022-01-06 MED ORDER — THIAMINE HCL 100 MG PO TABS
100.0000 mg | ORAL_TABLET | Freq: Every day | ORAL | 1 refills | Status: DC
  Filled 2022-01-06: qty 30, 30d supply, fill #0

## 2022-01-06 MED ORDER — CEFDINIR 300 MG PO CAPS
300.0000 mg | ORAL_CAPSULE | Freq: Two times a day (BID) | ORAL | 0 refills | Status: AC
  Filled 2022-01-06: qty 6, 3d supply, fill #0

## 2022-01-06 MED ORDER — PREDNISONE 20 MG PO TABS
20.0000 mg | ORAL_TABLET | Freq: Every day | ORAL | 0 refills | Status: DC
  Filled 2022-01-06: qty 30, 30d supply, fill #0

## 2022-01-06 MED ORDER — FOLIC ACID 1 MG PO TABS
1.0000 mg | ORAL_TABLET | Freq: Every day | ORAL | 1 refills | Status: DC
  Filled 2022-01-06: qty 30, 30d supply, fill #0

## 2022-01-06 MED ORDER — OLMESARTAN MEDOXOMIL 20 MG PO TABS
20.0000 mg | ORAL_TABLET | Freq: Every day | ORAL | 1 refills | Status: DC
  Filled 2022-01-06: qty 30, 30d supply, fill #0

## 2022-01-06 MED ORDER — CLOPIDOGREL BISULFATE 75 MG PO TABS
75.0000 mg | ORAL_TABLET | Freq: Every day | ORAL | 2 refills | Status: DC
  Filled 2022-01-06: qty 30, 30d supply, fill #0

## 2022-01-06 NOTE — Progress Notes (Signed)
Progress Note  Patient Name: [name] Date of Encounter: 01/06/2022  Primary Cardiologist: Carlyle Dolly, MD   Subjective   Out of unit and feels back to baseline.  Notes that primary team feels she is safe to go home and they are just waiting for cardiology recs.  Inpatient Medications    Scheduled Meds:  arformoterol  15 mcg Nebulization BID   aspirin EC  81 mg Oral Daily   azithromycin  500 mg Oral Daily   budesonide (PULMICORT) nebulizer solution  0.25 mg Nebulization BID   clopidogrel  75 mg Oral Daily   folic acid  1 mg Oral Daily   heparin injection (subcutaneous)  5,000 Units Subcutaneous Q8H   insulin aspart  0-20 Units Subcutaneous Q4H   LORazepam  0-4 mg Intravenous Q12H   multivitamin with minerals  1 tablet Oral Daily   pantoprazole (PROTONIX) IV  40 mg Intravenous QHS   predniSONE  40 mg Oral Daily   revefenacin  175 mcg Nebulization Daily   rosuvastatin  10 mg Oral Daily   thiamine  100 mg Oral Daily   Or   thiamine  100 mg Intravenous Daily   Continuous Infusions:  cefTRIAXone (ROCEPHIN)  IV 1 g (01/06/22 0905)   PRN Meds: acetaminophen, albuterol, haloperidol lactate, hydrALAZINE, lip balm, ondansetron **OR** ondansetron (ZOFRAN) IV   Vital Signs    Vitals:   01/06/22 0009 01/06/22 0424 01/06/22 0803 01/06/22 0835  BP: 127/75 121/81  134/75  Pulse: 87 90 96 90  Resp: 18 20 19 18   Temp: (!) 97.4 F (36.3 C) 98.3 F (36.8 C)  97.8 F (36.6 C)  TempSrc: Oral Oral  Oral  SpO2: 100% 100% 100% 100%  Weight:      Height:        Intake/Output Summary (Last 24 hours) at 01/06/2022 0948 Last data filed at 01/06/2022 5361 Gross per 24 hour  Intake 590 ml  Output 200 ml  Net 390 ml   Filed Weights   01/02/22 0501  Weight: 102 kg    Telemetry    Sinus tachycardia - Personally Reviewed  Physical Exam   Gen: No distress, older than stated age   Neck: No JVD  Cardiac: No Rubs or Gallops, no Murmur, regular tachycardia, +2  radial pulses Respiratory: Bilateral wheezes, normal effort, normal  respiratory rate GI: Soft, nontender, non-distended  MS: trace  edema;  moves all extremities Integument: Skin feels warm, has well dress wounded on r leg, sacral wound well dressed. Neuro:  At time of evaluation, alert and oriented to person/place/time/situation  Psych: Normal affect, patient feels well   Labs    Chemistry Recent Labs  Lab 01/02/22 0451 01/02/22 1638 01/04/22 0007 01/05/22 0045 01/06/22 0118  NA 125*   < > 134* 136 139  K 4.3   < > 4.0 4.2 3.8  CL 88*   < > 97* 97* 94*  CO2 24   < > 27 29 32  GLUCOSE 162*   < > 176* 198* 135*  BUN 13   < > 16 22 20   CREATININE 0.64   < > 0.63 0.63 0.59  CALCIUM 10.6*   < > 9.8 10.5* 10.8*  PROT 7.5  --   --   --   --   ALBUMIN 4.0  --   --   --   --   AST 32  --   --   --   --   ALT 27  --   --   --   --  ALKPHOS 75  --   --   --   --   BILITOT 0.3  --   --   --   --   GFRNONAA >60   < > >60 >60 >60  ANIONGAP 13   < > 10 10 13    < > = values in this interval not displayed.     Hematology Recent Labs  Lab 01/04/22 0007 01/05/22 0045 01/06/22 0118  WBC 22.9* 22.2* 18.6*  RBC 3.42* 3.46* 3.42*  HGB 10.9* 11.1* 10.8*  HCT 32.0* 32.6* 32.8*  MCV 93.6 94.2 95.9  MCH 31.9 32.1 31.6  MCHC 34.1 34.0 32.9  RDW 12.6 12.8 12.9  PLT 267 248 243    Cardiac EnzymesNo results for input(s): "TROPONINI" in the last 168 hours. No results for input(s): "TROPIPOC" in the last 168 hours.   BNP Recent Labs  Lab 01/03/22 0913 01/05/22 0045  BNP 335.6* 130.1*     DDimer No results for input(s): "DDIMER" in the last 168 hours.   Radiology    No results found.   Patient Profile     66 y.o. female hx of polysubstance abuse with delerium and concerns for toxic ingestion complicated by NSTEMI with new cardiomyopathy  Assessment & Plan    NSTEMI COPD HTN with DM Tobacco abuse- current Benzo positive with unclear timeline Cocaine abuse-current  last positive 01/02/22 Chronic hyponatremia New LVEF ~ 45% - continue aspirin and plavix for one year (medical management of NSTEMI), s/p heparin for 48 hours - continue rosuvastatin 10 mg - return home ARB at discharge - return home torsemide ad discharge - stop home simvastatin and how sumatriptan at discharge, can restart triptan in negative LHC - counseled substance cessation - will arrange [place] f/u and if still substance free would recommend LHC    For questions or updates, please contact [facility] Please consult [email or web address] for contact info under Cardiology/STEMI.      Rudean Haskell, MD [address], [address] [place], [place] [address] [phone]  9:48 AM

## 2022-01-06 NOTE — Plan of Care (Signed)
  Problem: Education: Goal: Knowledge of General Education information will improve Description: Including pain rating scale, medication(s)/side effects and non-pharmacologic comfort measures Outcome: Adequate for Discharge   Problem: Health Behavior/Discharge Planning: Goal: Ability to manage health-related needs will improve Outcome: Adequate for Discharge   Problem: Clinical Measurements: Goal: Ability to maintain clinical measurements within normal limits will improve Outcome: Adequate for Discharge Goal: Will remain free from infection Outcome: Adequate for Discharge Goal: Diagnostic test results will improve Outcome: Adequate for Discharge Goal: Respiratory complications will improve Outcome: Adequate for Discharge Goal: Cardiovascular complication will be avoided Outcome: Adequate for Discharge   Problem: Activity: Goal: Risk for activity intolerance will decrease Outcome: Adequate for Discharge   Problem: Nutrition: Goal: Adequate nutrition will be maintained Outcome: Adequate for Discharge   Problem: Elimination: Goal: Will not experience complications related to bowel motility Outcome: Adequate for Discharge Goal: Will not experience complications related to urinary retention Outcome: Adequate for Discharge   Problem: Pain Managment: Goal: General experience of comfort will improve Outcome: Adequate for Discharge   Problem: Safety: Goal: Ability to remain free from injury will improve Outcome: Adequate for Discharge   Problem: Skin Integrity: Goal: Risk for impaired skin integrity will decrease Outcome: Adequate for Discharge   Problem: Education: Goal: Knowledge of disease or condition will improve Outcome: Adequate for Discharge Goal: Knowledge of the prescribed therapeutic regimen will improve Outcome: Adequate for Discharge Goal: Individualized Educational Video(s) Outcome: Adequate for Discharge   Problem: Activity: Goal: Ability to tolerate  increased activity will improve Outcome: Adequate for Discharge Goal: Will verbalize the importance of balancing activity with adequate rest periods Outcome: Adequate for Discharge   Problem: Respiratory: Goal: Ability to maintain a clear airway will improve Outcome: Adequate for Discharge Goal: Levels of oxygenation will improve Outcome: Adequate for Discharge Goal: Ability to maintain adequate ventilation will improve Outcome: Adequate for Discharge

## 2022-01-06 NOTE — Care Management Important Message (Signed)
Important Message  Patient Details  Name: [name] MRN: [id number] Date of Birth: 09-27-1955   Medicare Important Message Given:  Yes     Shelda Altes 01/06/2022, 8:02 AM

## 2022-01-06 NOTE — Telephone Encounter (Signed)
Pt scheduled with TP on 7/11 for HFU.

## 2022-01-06 NOTE — Discharge Summary (Signed)
Physician Discharge Summary  [name] ZOX:[id number] DOB: 1956/06/01 DOA: 01/02/2022  PCP: Beverly Milch, NP  Admit date: 01/02/2022 Discharge date: 01/06/2022  Admitted From: Home Disposition:  Home  Discharge Condition:Stable CODE STATUS:FULL Diet recommendation: Heart Healthy   Brief/Interim Summary:  Patient is a 66 year old female with history of COPD, chronic hypoxic respiratory failure on home oxygen, hypertension, polysubstance abuse, GERD, diabetes type 2 who was brought to the emergency department for the evaluation of somnolence secondary to alcohol/cocaine abuse at home.  Patient was brought by the husband through EMS.  As per EMS, there were several bottles of prescription medication that did not belong to her, patient was having uncontrollable movements, and slurred speech.  Patient was given multiple dose of Ativan, Haldol to calm her down.  She was hypertensive in presentation.  Lab work showed leukocytosis, sodium of 125, elevated troponin.  Due to her increased work of breathing, she was put on BiPAP at she was transferred to ICU on 7/2.  Now back to [facility] service on 7/4, off BiPAP.  Currently she is on her baseline oxygen requirement.  Cardiology and pulmonology cleared for discharge.  She is medically stable for discharge home today.  Following problems were addressed during her hospitalization:  Tachypnea/acute chronic hypoxic respiratory failure: On 2 L of oxygen at home.  Initially on room air but subsequently required oxygen then had to put on BiPAP.  This is most likely secondary to opiates causing respiratory inhibition versus COPD exacerbation.X-ray of the chest did not show any pleural effusion or pneumonia.  Patient had to be transferred to ICU on 7/2 due to increased workload of breathing.  Now off BiPAP.  Currently on baseline oxygen requirement.  Respiratory status stable   Acute somnolence/altered mental status: Likely secondary to substance abuse.  UDS  positive for cocaine, benzodiazepines.    CT head did not show any acute intracranial findings.  Now alert and oriented.   Elevated troponin/acute systolic congestive heart failure: Troponin remarkably elevated after initial level of 100.  Started on heparin drip. Consulted cardiology though this is most likely secondary to supply demand ischemia from cocaine use. Echo showed new ejection fraction of 45%.,  Right wall motion abnormality.  Started on heparin, aspirin, Plavix.  Cardiology planning for left heart cath as an  outpatient.  Mild elevated BNP.  Continue torsemide, Crestor, ARB   leukocytosis/suspected UTI: Blood cultures have not shown any growth.  Her urine was grossly turbid t.  Urine culture showed multiple species.  Started on ceftriaxone, will change to [facility].   Polysubstance abuse: UDS positive for cocaine ,benzodiazepines.  Counseled for cessation.   COPD exacerbation: Started on azithromycin, bronchodilators, prednisone.  She takes 20 mg daily at home.  Diabetes type 2: Takes metformin.   Hyponatremia: Improved    Obesity: BMI 38.6   Deconditioning/debility: Consulted PT/OT, no follow-up recommended     Discharge Diagnoses:  Principal Problem:   Delirium Active Problems:   Benzodiazepine dependence (HCC)   Essential hypertension   COPD exacerbation (HCC)   Polysubstance abuse (HCC)   Obesity (BMI 30-39.9)   DM (diabetes mellitus), type 2 (HCC)   Chronic hypoxemic respiratory failure (HCC)   Hyponatremia   Elevated troponin   Leukocytosis   NSTEMI (non-ST elevated myocardial infarction) (HCC)   Pressure injury of skin    Discharge Instructions  Discharge Instructions     Diet - low sodium heart healthy   Complete by: As directed    Discharge instructions  Complete by: As directed    1)Quit substance abuse 2)Take prescribed medications as instructed 3)Follow up with your PCP in a week 4)Follow up with cardiology and pulmonology as an outpatient.   You will be called for appointment.   Increase activity slowly   Complete by: As directed    No wound care   Complete by: As directed       Allergies as of 01/06/2022       Reactions   Penicillins Other (See Comments)   Patient is unsure if she allergic to penicillin or septra. Patient states one or another caused "rib pain with a little breathing problem". Has patient had a PCN reaction causing immediate rash, facial/tongue/throat swelling, SOB or lightheadedness with hypotension: YES Has patient had a PCN reaction causing severe rash involving mucus membranes or skin necrosis: NO Has patient had a PCN reaction that required hospitalization: NO Has patient had a PCN reaction occurring within the last 10 years: NO   Septra [sulfamethoxazole-trimethoprim] Other (See Comments)   Patient is unsure if she allergic to septra or penicillin. Patient states one or another caused "rib pain with a little breathing problem".        Medication List     STOP taking these medications    benzonatate 200 MG capsule Commonly known as: TESSALON   doxycycline 100 MG tablet Commonly known as: VIBRA-TABS   DULoxetine 30 MG capsule Commonly known as: CYMBALTA   olmesartan-hydrochlorothiazide 20-12.5 MG tablet Commonly known as: BENICAR HCT       TAKE these medications    acetaminophen 500 MG tablet Commonly known as: TYLENOL Take 1 tablet (500 mg total) by mouth every 6 (six) hours as needed.   albuterol (2.5 MG/3ML) 0.083% nebulizer solution Commonly known as: PROVENTIL INHALE 1 VIAL VIA NEBULIZER EVERY 4 HOURS What changed: See the new instructions.   albuterol 108 (90 Base) MCG/ACT inhaler Commonly known as: VENTOLIN HFA INHALE 1 OR 2 PUFFS BY MOUTH EVERY 6 HOURS AS NEEDED FOR WHEEZING OR SHORTNESS OF BREATH What changed: See the new instructions.   aspirin EC 81 MG tablet Take 1 tablet (81 mg total) by mouth daily. Swallow whole. Start taking on: January 07, 2022   Breztri  Aerosphere 160-9-4.8 MCG/ACT Aero Generic drug: Budeson-Glycopyrrol-Formoterol Inhale 2 puffs into the lungs 2 (two) times daily.   cefdinir 300 MG capsule Commonly known as: OMNICEF Take 1 capsule (300 mg total) by mouth 2 (two) times daily for 3 days. Start taking on: January 07, 2022   cetirizine 10 MG tablet Commonly known as: ZYRTEC Take 10 mg by mouth daily as needed for allergies.   clopidogrel 75 MG tablet Commonly known as: PLAVIX Take 1 tablet (75 mg total) by mouth daily. Start taking on: January 07, 2022   Combivent Respimat 20-100 MCG/ACT Aers respimat Generic drug: Ipratropium-Albuterol 1 puff as needed   cyclobenzaprine 10 MG tablet Commonly known as: FLEXERIL Take 10 mg by mouth 3 (three) times daily as needed for muscle spasms.   FLUoxetine 20 MG capsule Commonly known as: PROZAC Take 20 mg by mouth daily.   folic acid 1 MG tablet Commonly known as: FOLVITE Take 1 tablet (1 mg total) by mouth daily. Start taking on: January 07, 2022   gabapentin 800 MG tablet Commonly known as: NEURONTIN Take 1 tablet (800 mg total) by mouth 3 (three) times daily as needed. What changed:  when to take this reasons to take this   hydrOXYzine 50 MG capsule Commonly known  as: VISTARIL TAKE 1 CAPSULE BY MOUTH THREE TIMES DAILY AS NEEDED FOR ANXIETY What changed: See the new instructions.   ipratropium 0.02 % nebulizer solution Commonly known as: ATROVENT Take 2.5 mLs by nebulization every 4 (four) hours as needed for wheezing or shortness of breath.   meloxicam 15 MG tablet Commonly known as: MOBIC Take 15 mg by mouth daily.   metFORMIN 1000 MG tablet Commonly known as: GLUCOPHAGE Take 1,000 mg by mouth 2 (two) times daily.   nitroGLYCERIN 0.4 MG SL tablet Commonly known as: NITROSTAT Place under the tongue.   olmesartan 20 MG tablet Commonly known as: BENICAR Take 1 tablet (20 mg total) by mouth daily.   omeprazole 20 MG capsule Commonly known as: PRILOSEC Take  20 mg by mouth daily.   ondansetron 4 MG disintegrating tablet Commonly known as: ZOFRAN-ODT Take 4 mg by mouth 2 (two) times daily as needed for nausea/vomiting.   oxybutynin 5 MG 24 hr tablet Commonly known as: DITROPAN-XL Take 5 mg by mouth daily.   predniSONE 20 MG tablet Commonly known as: DELTASONE Take 1 tablet (20 mg total) by mouth daily with breakfast. Take 2 pills daily for 3 days then continue taking 1 pill daily until follow up with your pulmonologist Start taking on: January 07, 2022 What changed:  additional instructions Another medication with the same name was removed. Continue taking this medication, and follow the directions you see here.   promethazine 25 MG tablet Commonly known as: PHENERGAN Take 25 mg by mouth 3 (three) times daily as needed for nausea/vomiting.   rOPINIRole 1 MG tablet Commonly known as: REQUIP Take 1 mg by mouth 3 (three) times daily.   rosuvastatin 10 MG tablet Commonly known as: CRESTOR Take 1 tablet (10 mg total) by mouth daily. Start taking on: January 07, 2022   simvastatin 20 MG tablet Commonly known as: ZOCOR Take 20 mg by mouth daily.   SUMAtriptan 25 MG tablet Commonly known as: IMITREX Take 25 mg by mouth every 2 (two) hours as needed for migraine.   thiamine 100 MG tablet Take 1 tablet (100 mg total) by mouth daily. Start taking on: January 07, 2022   torsemide 20 MG tablet Commonly known as: DEMADEX Take 1 tablet (20 mg total) by mouth daily as needed. May take an additional 20 mg daily as needed for extreme swelling.        Follow-up Information     Hyler, Gwen Her, NP. Schedule an appointment as soon as possible for a visit in 1 week(s).   Specialty: Nephrology Contact information: [place] [address] [phone]                Allergies  Allergen Reactions   Penicillins Other (See Comments)    Patient is unsure if she allergic to penicillin or septra. Patient states one or  another caused "rib pain with a little breathing problem". Has patient had a PCN reaction causing immediate rash, facial/tongue/throat swelling, SOB or lightheadedness with hypotension: YES Has patient had a PCN reaction causing severe rash involving mucus membranes or skin necrosis: NO Has patient had a PCN reaction that required hospitalization: NO Has patient had a PCN reaction occurring within the last 10 years: NO   Septra [Sulfamethoxazole-Trimethoprim] Other (See Comments)    Patient is unsure if she allergic to septra or penicillin. Patient states one or another caused "rib pain with a little breathing problem".    Consultations: PCCM, cardiology  Procedures/Studies: ECHOCARDIOGRAM COMPLETE  Result Date: 01/03/2022    ECHOCARDIOGRAM REPORT   Patient Name:   JENETTE RAYSON [place]back Date of Exam: 01/03/2022 Medical Rec #:  [id number]             Height:       64.0 in Accession #:    [id number]            Weight:       224.9 lb Date of Birth:  1955/08/04            BSA:          2.056 m Patient Age:    22 years              BP:           125/75 mmHg Patient Gender: F                     HR:           109 bpm. Exam Location:  Inpatient Procedure: 2D Echo Indications:    NSTEMI  History:        Patient has prior history of Echocardiogram examinations, most                 recent 06/14/2019. COPD; Risk Factors:Hypertension,                 Dyslipidemia, Diabetes, polysubstance abuse and Current Smoker.  Sonographer:    Johny Chess RDCS Referring Phys: [phone] JACOB J STINSON  Sonographer Comments: Image acquisition challenging due to respiratory motion and Image acquisition challenging due to COPD. IMPRESSIONS  1. Septal and apical hypokinesis . Left ventricular ejection fraction, by estimation, is 40 to 45%. The left ventricle has mildly decreased function. The left ventricle demonstrates regional wall motion abnormalities (see scoring diagram/findings for description). The left ventricular  internal cavity size was mildly dilated. Left ventricular diastolic parameters are indeterminate.  2. Right ventricular systolic function is normal. The right ventricular size is normal.  3. The mitral valve is normal in structure. No evidence of mitral valve regurgitation.  4. The aortic valve is tricuspid. There is mild calcification of the aortic valve. Aortic valve regurgitation is not visualized. Aortic valve sclerosis is present, with no evidence of aortic valve stenosis. FINDINGS  Left Ventricle: Septal and apical hypokinesis. Left ventricular ejection fraction, by estimation, is 40 to 45%. The left ventricle has mildly decreased function. The left ventricle demonstrates regional wall motion abnormalities. The left ventricular internal cavity size was mildly dilated. There is no left ventricular hypertrophy. Left ventricular diastolic parameters are indeterminate. Right Ventricle: The right ventricular size is normal. No increase in right ventricular wall thickness. Right ventricular systolic function is normal. Left Atrium: Left atrial size was normal in size. Right Atrium: Right atrial size was normal in size. Pericardium: There is no evidence of pericardial effusion. Mitral Valve: The mitral valve is normal in structure. No evidence of mitral valve regurgitation. Tricuspid Valve: The tricuspid valve is not well visualized. Tricuspid valve regurgitation is mild. Aortic Valve: The aortic valve is tricuspid. There is mild calcification of the aortic valve. Aortic valve regurgitation is not visualized. Aortic valve sclerosis is present, with no evidence of aortic valve stenosis. Pulmonic Valve: The pulmonic valve was not well visualized. Pulmonic valve regurgitation is not visualized. Aorta: The aortic root is normal in size and structure. IAS/Shunts: The interatrial septum was not well visualized.  LEFT VENTRICLE PLAX 2D LVIDd:  3.50 cm LVIDs:         2.30 cm LV PW:         1.10 cm LV IVS:        1.00  cm LVOT diam:     1.80 cm LV SV:         39 LV SV Index:   19 LVOT Area:     2.54 cm  LV Volumes (MOD) LV vol d, MOD A4C: 75.4 ml LV vol s, MOD A4C: 42.0 ml LV SV MOD A4C:     75.4 ml RIGHT VENTRICLE             IVC RV S prime:     12.20 cm/s  IVC diam: 2.30 cm TAPSE (M-mode): 2.4 cm LEFT ATRIUM             Index        RIGHT ATRIUM           Index LA diam:        3.10 cm 1.51 cm/m   RA Area:     13.50 cm LA Vol (A2C):   51.4 ml 25.00 ml/m  RA Volume:   31.20 ml  15.17 ml/m LA Vol (A4C):   46.5 ml 22.61 ml/m LA Biplane Vol: 50.9 ml 24.75 ml/m  AORTIC VALVE LVOT Vmax:   94.70 cm/s LVOT Vmean:  63.600 cm/s LVOT VTI:    0.153 m  AORTA Ao Root diam: 2.90 cm Ao Asc diam:  3.40 cm  SHUNTS Systemic VTI:  0.15 m Systemic Diam: 1.80 cm Jenkins Rouge MD Electronically signed by Jenkins Rouge MD Signature Date/Time: 01/03/2022/6:36:16 PM    Final    DG Chest Port 1 View  Result Date: 01/03/2022 CLINICAL DATA:  66 year old female with history of acute respiratory distress. EXAM: PORTABLE CHEST 1 VIEW COMPARISON:  Chest x-ray 01/02/2022. FINDINGS: There is cephalization of the pulmonary vasculature and slight indistinctness of the interstitial markings suggestive of mild pulmonary edema. No definite pleural effusions. No pneumothorax. No evidence heart size is mildly enlarged. Upper mediastinal contours are within normal limits. Atherosclerotic calcifications in the thoracic aorta. IMPRESSION: 1. The appearance the chest suggests mild congestive heart failure, as above. Electronically Signed   By: Vinnie Langton M.D.   On: 01/03/2022 08:20   DG Chest 1V REPEAT Same Day  Result Date: 01/02/2022 CLINICAL DATA:  Altered mental status. EXAM: CHEST - 1 VIEW SAME DAY COMPARISON:  01/02/2022. FINDINGS: Cardiac silhouette is normal in size. No mediastinal or hilar masses. Prominent bronchovascular/interstitial markings, stable. No lung consolidation or convincing edema. No pleural effusion or pneumothorax. Skeletal structures  are demineralized, but grossly intact. IMPRESSION: No acute cardiopulmonary disease. Electronically Signed   By: Lajean Manes M.D.   On: 01/02/2022 17:21   CT Head Wo Contrast  Result Date: 01/02/2022 CLINICAL DATA:  Mental status change. Unknown cause. History of illicit drug use. EXAM: CT HEAD WITHOUT CONTRAST TECHNIQUE: Contiguous axial images were obtained from the base of the skull through the vertex without intravenous contrast. RADIATION DOSE REDUCTION: This exam was performed according to the departmental dose-optimization program which includes automated exposure control, adjustment of the mA and/or kV according to patient size and/or use of iterative reconstruction technique. COMPARISON:  Head CT 07/15/2014. FINDINGS: Despite efforts by the technologist and patient, moderate to severe motion artifact is present on today's exam and could not be eliminated. This reduces exam sensitivity and specificity. Multiple images were repeated. Brain: There is no evidence of acute intracranial  hemorrhage, mass lesion, brain edema or extra-axial fluid collection. The ventricles and subarachnoid spaces are appropriately sized for age. There is no CT evidence of acute cortical infarction. Stable mild chronic low-density in the left frontal periventricular white matter. Vascular: Intracranial vascular calcifications. No hyperdense vessel identified. Skull: Negative for fracture or focal lesion. Sinuses/Orbits: Possible small fluid level in the right division of the sphenoid sinus. The additional paranasal sinuses, mastoid air cells and middle ears are clear. No orbital abnormalities are seen. Other: None. IMPRESSION: 1. Significantly motion degraded examination demonstrates no definite acute intracranial findings. 2. Possible small fluid level within the right sphenoid sinus. No acute osseous findings identified. Electronically Signed   By: Richardean Sale M.D.   On: 01/02/2022 14:28   DG Chest Port 1 View  Result  Date: 01/02/2022 CLINICAL DATA:  66 year old female with shortness of breath and altered mental status. EXAM: PORTABLE CHEST 1 VIEW COMPARISON:  Chest radiographs 04/29/2021 and earlier. FINDINGS: Portable AP semi upright view at 0754 hours. Stable lung volumes, mediastinal contours remain within normal limits. Visualized tracheal air column is within normal limits. Chronic coarse bilateral pulmonary interstitial opacity appears stable. No superimposed pneumothorax, pulmonary edema, or acute pulmonary opacity. No acute osseous abnormality identified. IMPRESSION: Chronic pulmonary interstitial disease. No acute cardiopulmonary abnormality. Electronically Signed   By: Genevie Ann M.D.   On: 01/02/2022 08:22      Subjective: Patient seen and examined at the bedside this morning.  Hemodynamically stable for discharge today.  Discharge Exam: Vitals:   01/06/22 0803 01/06/22 0835  BP:  134/75  Pulse: 96 90  Resp: 19 18  Temp:  97.8 F (36.6 C)  SpO2: 100% 100%   Vitals:   01/06/22 0009 01/06/22 0424 01/06/22 0803 01/06/22 0835  BP: 127/75 121/81  134/75  Pulse: 87 90 96 90  Resp: 18 20 19 18   Temp: (!) 97.4 F (36.3 C) 98.3 F (36.8 C)  97.8 F (36.6 C)  TempSrc: Oral Oral  Oral  SpO2: 100% 100% 100% 100%  Weight:      Height:        General: Pt is alert, awake, not in acute distress Cardiovascular: RRR, S1/S2 +, no rubs, no gallops Respiratory: CTA bilaterally, no wheezing, no rhonchi, mild wheezing Abdominal: Soft, NT, ND, bowel sounds + Extremities: no edema, no cyanosis    The results of significant diagnostics from this hospitalization (including imaging, microbiology, ancillary and laboratory) are listed below for reference.     Microbiology: Recent Results (from the past 240 hour(s))  Culture, blood (Routine X 2) w Reflex to ID Panel     Status: None (Preliminary result)   Collection Time: 01/02/22  5:09 PM   Specimen: BLOOD RIGHT HAND  Result Value Ref Range Status    Specimen Description   Final    BLOOD RIGHT HAND BOTTLES DRAWN AEROBIC AND ANAEROBIC   Special Requests Blood Culture adequate volume  Final   Culture   Final    NO GROWTH 4 DAYS Performed at [facility], [address], [place], [place] [address]    Report Status PENDING  Incomplete  Culture, blood (Routine X 2) w Reflex to ID Panel     Status: None (Preliminary result)   Collection Time: 01/02/22  5:19 PM   Specimen: BLOOD LEFT HAND  Result Value Ref Range Status   Specimen Description   Final    BLOOD LEFT HAND BOTTLES DRAWN AEROBIC AND ANAEROBIC   Special Requests Blood Culture adequate volume  Final   Culture   Final    NO GROWTH 4 DAYS Performed at [facility], [address] [place], [place] [address]    Report Status PENDING  Incomplete  Urine Culture     Status: Abnormal   Collection Time: 01/05/22  1:47 PM   Specimen: Urine, Clean Catch  Result Value Ref Range Status   Specimen Description URINE, CLEAN CATCH  Final   Special Requests   Final    NONE Performed at [facility], [address] [place], [place] [address]    Culture MULTIPLE SPECIES PRESENT, SUGGEST RECOLLECTION (A)  Final   Report Status 01/06/2022 FINAL  Final     Labs: BNP (last 3 results) Recent Labs    04/04/21 0040 01/03/22 0913 01/05/22 0045  BNP 19.0 335.6* 026.3*   Basic Metabolic Panel: Recent Labs  Lab 01/03/22 0332 01/03/22 0913 01/04/22 0007 01/05/22 0045 01/06/22 0118  NA 131* 134* 134* 136 139  K 4.2 4.3 4.0 4.2 3.8  CL 96* 98 97* 97* 94*  CO2 24 23 27 29  32  GLUCOSE 167* 174* 176* 198* 135*  BUN 11 12 16 22 20   CREATININE 0.83 0.70 0.63 0.63 0.59  CALCIUM 9.6 9.6 9.8 10.5* 10.8*  MG  --   --   --  1.3* 1.8   Liver Function Tests: Recent Labs  Lab 01/02/22 0451  AST 32  ALT 27  ALKPHOS 75  BILITOT 0.3  PROT 7.5  ALBUMIN 4.0   No results for input(s): "LIPASE", "AMYLASE" in the last 168 hours. No results for input(s): "AMMONIA" in the last 168  hours. CBC: Recent Labs  Lab 01/02/22 0451 01/03/22 0332 01/03/22 0913 01/04/22 0007 01/05/22 0045 01/06/22 0118  WBC 26.0* 20.1* 18.4* 22.9* 22.2* 18.6*  NEUTROABS 20.9*  --   --   --   --   --   HGB 12.3 11.7* 11.2* 10.9* 11.1* 10.8*  HCT 35.6* 33.3* 33.7* 32.0* 32.6* 32.8*  MCV 92.7 92.5 94.4 93.6 94.2 95.9  PLT 339 250 250 267 248 243   Cardiac Enzymes: Recent Labs  Lab 01/02/22 0451 01/02/22 1638 01/02/22 2256 01/03/22 0332  CKTOTAL 289* 670* 371* 260*   BNP: Invalid input(s): "POCBNP" CBG: Recent Labs  Lab 01/05/22 2101 01/06/22 0016 01/06/22 0431 01/06/22 0811 01/06/22 0831  GLUCAP 230* 155* 131* 101* 133*   D-Dimer No results for input(s): "DDIMER" in the last 72 hours. Hgb A1c No results for input(s): "HGBA1C" in the last 72 hours. Lipid Profile No results for input(s): "CHOL", "HDL", "LDLCALC", "TRIG", "CHOLHDL", "LDLDIRECT" in the last 72 hours. Thyroid function studies No results for input(s): "TSH", "T4TOTAL", "T3FREE", "THYROIDAB" in the last 72 hours.  Invalid input(s): "FREET3" Anemia work up No results for input(s): "VITAMINB12", "FOLATE", "FERRITIN", "TIBC", "IRON", "RETICCTPCT" in the last 72 hours. Urinalysis    Component Value Date/Time   COLORURINE YELLOW 01/02/2022 IXL 01/02/2022 0634   LABSPEC 1.014 01/02/2022 0634   PHURINE 5.0 01/02/2022 0634   GLUCOSEU 150 (A) 01/02/2022 0634   HGBUR SMALL (A) 01/02/2022 0634   BILIRUBINUR NEGATIVE 01/02/2022 0634   BILIRUBINUR negative 08/01/2015 1122   KETONESUR NEGATIVE 01/02/2022 0634   PROTEINUR 100 (A) 01/02/2022 0634   UROBILINOGEN negative 08/01/2015 1122   UROBILINOGEN 0.2 09/17/2014 2143   NITRITE NEGATIVE 01/02/2022 0634   LEUKOCYTESUR NEGATIVE 01/02/2022 0634   Sepsis Labs Recent Labs  Lab 01/03/22 0913 01/04/22 0007 01/05/22 0045 01/06/22 0118  WBC 18.4* 22.9*  22.2* 18.6*   Microbiology Recent Results (from the past 240 hour(s))  Culture, blood  (Routine X 2) w Reflex to ID Panel     Status: None (Preliminary result)   Collection Time: 01/02/22  5:09 PM   Specimen: BLOOD RIGHT HAND  Result Value Ref Range Status   Specimen Description   Final    BLOOD RIGHT HAND BOTTLES DRAWN AEROBIC AND ANAEROBIC   Special Requests Blood Culture adequate volume  Final   Culture   Final    NO GROWTH 4 DAYS Performed at [facility], [address], [place], [place] [address]    Report Status PENDING  Incomplete  Culture, blood (Routine X 2) w Reflex to ID Panel     Status: None (Preliminary result)   Collection Time: 01/02/22  5:19 PM   Specimen: BLOOD LEFT HAND  Result Value Ref Range Status   Specimen Description   Final    BLOOD LEFT HAND BOTTLES DRAWN AEROBIC AND ANAEROBIC   Special Requests Blood Culture adequate volume  Final   Culture   Final    NO GROWTH 4 DAYS Performed at [facility], [address], [place], [place] [place] [id number]    Report Status PENDING  Incomplete  Urine Culture     Status: Abnormal   Collection Time: 01/05/22  1:47 PM   Specimen: Urine, Clean Catch  Result Value Ref Range Status   Specimen Description URINE, CLEAN CATCH  Final   Special Requests   Final    NONE Performed at [facility], [place] [address] [place], [place] [address]    Culture MULTIPLE SPECIES PRESENT, SUGGEST RECOLLECTION (A)  Final   Report Status 01/06/2022 FINAL  Final    Please note: You were cared for by a hospitalist during your hospital stay. Once you are discharged, your primary care physician will handle any further medical issues. Please note that NO REFILLS for any discharge medications will be authorized once you are discharged, as it is imperative that you return to your primary care physician (or establish a relationship with a primary care physician if you do not have one) for your post hospital discharge needs so that they can reassess your need for medications and monitor your lab values.    Time coordinating discharge:  40 minutes  SIGNED:   Shelly Coss, MD  [facility] 01/06/2022, 10:49 AM Pager [phone]  If 7PM-7AM, please contact night-coverage [email or web address] Password [email or web address]

## 2022-01-06 NOTE — TOC CAGE-AID Note (Signed)
Transition of Care [facility]) - CAGE-AID Screening   Patient Details  Name: [name] MRN: [id number] Date of Birth: 24-Jan-1956  Transition of Care [facility]) CM/SW Contact:    Vinie Sill, LCSW Phone Number: 01/06/2022, 11:37 AM   Clinical Narrative:  CSW met with patient at bedside. CSW introduced self and explained role. CSW provided substance abuse and mental health education. CSW discussed identifying triggers and developing healthier coping skills. CSW encourage patient to contact [facility] and utilize resources in the community to support her on her journey to sobriety. All questions answered.   Resources was offered, patient states she is aware of resources in [place].  CAGE-AID Screening:    Have You Ever Felt You Ought to Cut Down on Your Drinking or Drug Use?: Yes Have People Annoyed You By Critizing Your Drinking Or Drug Use?: Yes Have You Felt Bad Or Guilty About Your Drinking Or Drug Use?: Yes Have You Ever Had a Drink or Used Drugs First Thing In The Morning to Steady Your Nerves or to Get Rid of a Hangover?: No CAGE-AID Score: 3  Substance Abuse Education Offered: Yes  Substance abuse interventions: Other (must comment) (offered patient information , she states she is familar with [facility] and declined addtional information)

## 2022-01-06 NOTE — Progress Notes (Signed)
    01/06/22 0432  Respiratory  Respiratory (WDL) X  Cough Non-productive;Strong  Respiratory Interventions Cough and deep breathe w/ teach back  Respiratory Pattern Symmetrical;Dyspnea with exertion  Chest Assessment Chest expansion symmetrical  R Upper  Breath Sounds Expiratory wheezes  L Upper Breath Sounds Expiratory wheezes  R Lower Breath Sounds Expiratory wheezes  L Lower Breath Sounds Expiratory wheezes   PRN Albuterol NB given. Pt is poor tolerated short distance from bed to bedside commode with complaining of SOB and wheezing. SPO2 100% on 4 PLM of O2 NCL. RR 16-29. Hemodynamics stable. NSR on the monitor.   Kennyth Lose, RN

## 2022-01-07 LAB — CULTURE, BLOOD (ROUTINE X 2)
Culture: NO GROWTH
Culture: NO GROWTH
Special Requests: ADEQUATE
Special Requests: ADEQUATE

## 2022-01-07 NOTE — Progress Notes (Deleted)
Cardiology Office Note    Date:  01/07/2022   ID:  Rebekah, Peterson [birth year], MRN [id number]   PCP:  Willaim Sheng Gwen Her, NP   [place]  Cardiologist:  Carlyle Dolly, MD *** Advanced Practice Provider:  No care team member to display Electrophysiologist:  None   [phone]   No chief complaint on file.   History of Present Illness:  [name] is a 66 y.o. female with History of HTN, tobacco abuse,  bipolar disorder, chronic respiratory failure on home oxygen 3 L's Admitted  01/02/22 with delirium and somnolence and drug overdose including BZ'z, cocaine, ETOH and other pills. No history of CAD. She had an normal myovue June of 2022 at [facility] and sees Dr [name].  Patient had elevated troponins/NSTEMI and new CM EF 45%-no cath unless she is drug free.Plavix and ASA for one year,ARB/torsemide. No simvastatin with sumatriptan.    Past Medical History:  Diagnosis Date   Anemia    Anxiety    Arthritis    Bipolar 1 disorder (HCC)    CHF (congestive heart failure) (HCC)    COPD (chronic obstructive pulmonary disease) (HCC)    Depression    Diabetes mellitus without complication (HCC)    Dyspnea    Emphysema of lung (HCC)    GERD (gastroesophageal reflux disease)    Headache    migraines   History of hiatal hernia    History of kidney stones    Hyperlipidemia    Hypertension    Neuromuscular disorder (HCC)    neuropathy   On home oxygen therapy    Pneumonia 2015, 2019   Tracheomalacia    Vaginal Pap smear, abnormal     Past Surgical History:  Procedure Laterality Date   CATARACT EXTRACTION W/PHACO Left 01/14/2020   Procedure: CATARACT EXTRACTION PHACO AND INTRAOCULAR LENS PLACEMENT LEFT EYE;  Surgeon: Baruch Goldmann, MD;  Location: [facility];  Service: Ophthalmology;  Laterality: Left;  CDE: 8.18   CATARACT EXTRACTION W/PHACO Right 02/01/2020   Procedure: CATARACT EXTRACTION PHACO AND INTRAOCULAR LENS PLACEMENT RIGHT  EYE;  Surgeon: Baruch Goldmann, MD;  Location: [facility];  Service: Ophthalmology;  Laterality: Right;  CDE: 9.94   CHEILECTOMY Right 01/28/2020   Procedure: KELLER BUNION IMPLANT RIGHT FOOT CHEILECTOMY RIGHT ROOT;  Surgeon: Landis Martins, DPM;  Location: [place];  Service: Podiatry;  Laterality: Right;  BLOCK   CHOLECYSTECTOMY     COLONOSCOPY  July 2010   Dr. Arnoldo Morale: 3 rectal polyps, not enough tissue for pathologic examination, recommended surveillance in 3 years   COLONOSCOPY N/A 10/23/2014   RMR: Multiple colonic polyps removed as described above. No endoscopic explaniation for abdominal pain. however. next tcs 10/2019   ESOPHAGOGASTRODUODENOSCOPY  July 2010   Dr. Arnoldo Morale: gastritis and duodenitis, H.pylori negative   ESOPHAGOGASTRODUODENOSCOPY N/A 10/23/2014   RMR: Normal EGD. Status post passage of a Maloney dilator. Today's finding s would not explain abdominal pain   EYE SURGERY Left 01/14/2020   cataract removal   GANGLION CYST EXCISION Left 09/07/2018   Procedure: REMOVAL GANGLION OF WRIST;  Surgeon: Carole Civil, MD;  Location: [facility];  Service: Orthopedics;  Laterality: Left;   HERNIA REPAIR     Dr. Arnoldo Morale   KIDNEY STONE SURGERY     MALONEY DILATION N/A 10/23/2014   Procedure: Venia Minks DILATION;  Surgeon: Daneil Dolin, MD;  [facility];  Service: Endoscopy;  Laterality: N/A;   OPEN REDUCTION INTERNAL FIXATION (ORIF) DISTAL  RADIAL FRACTURE Right 12/09/2015   Procedure: OPEN REDUCTION INTERNAL FIXATION (ORIF) RIGHT DISTAL RADIUS;  Surgeon: Leanora Cover, MD;  Location: [place];  Service: Orthopedics;  Laterality: Right;    Current Medications: No outpatient medications have been marked as taking for the 01/13/22 encounter (Appointment) with Imogene Burn, PA-C.     Allergies:   Penicillins and Septra [sulfamethoxazole-trimethoprim]   Social History   Socioeconomic History   Marital status: Married    Spouse name: Not on file   Number of  children: Not on file   Years of education: Not on file   Highest education level: Not on file  Occupational History   Occupation: disability  Tobacco Use   Smoking status: Every Day    Packs/day: 1.00    Years: 52.00    Total pack years: 52.00    Types: Cigarettes    Start date: 1969   Smokeless tobacco: Never   Tobacco comments:    06/19/20- 10-12 cigarettes a day   Vaping Use   Vaping Use: Never used  Substance and Sexual Activity   Alcohol use: Yes    Alcohol/week: 1.0 standard drink of alcohol    Types: 1 Standard drinks or equivalent per week    Comment: alcohol abuse   Drug use: Yes    Types: Cocaine   Sexual activity: Not Currently    Birth control/protection: Post-menopausal  Other Topics Concern   Not on file  Social History Narrative   Not on file   Social Determinants of Health   Financial Resource Strain: High Risk (12/31/2020)   Overall Financial Resource Strain (CARDIA)    Difficulty of Paying Living Expenses: Hard  Food Insecurity: No Food Insecurity (12/31/2020)   Hunger Vital Sign    Worried About Running Out of Food in the Last Year: Never true    Ran Out of Food in the Last Year: Never true  Transportation Needs: No Transportation Needs (12/31/2020)   PRAPARE - Hydrologist (Medical): No    Lack of Transportation (Non-Medical): No  Physical Activity: Insufficiently Active (12/31/2020)   Exercise Vital Sign    Days of Exercise per Week: 2 days    Minutes of Exercise per Session: 10 min  Stress: Stress Concern Present (12/31/2020)   [place]    Feeling of Stress : Very much  Social Connections: Moderately Isolated (12/31/2020)   Social Connection and Isolation Panel [NHANES]    Frequency of Communication with Friends and Family: Three times a week    Frequency of Social Gatherings with Friends and Family: Once a week    Attends Religious Services: Never     Marine scientist or Organizations: No    Attends Music therapist: Never    Marital Status: Married     Family History:  The patient's ***family history is not on file. She was adopted.   ROS:   Please see the history of present illness.    ROS All other systems reviewed and are negative.   PHYSICAL EXAM:   VS:  There were no vitals taken for this visit.  Physical Exam  GEN: Well nourished, well developed, in no acute distress  HEENT: normal  Neck: no JVD, carotid bruits, or masses Cardiac:RRR; no murmurs, rubs, or gallops  Respiratory:  clear to auscultation bilaterally, normal work of breathing GI: soft, nontender, nondistended, + BS Ext: without cyanosis, clubbing, or edema, Good  distal pulses bilaterally MS: no deformity or atrophy  Skin: warm and dry, no rash Neuro:  Alert and Oriented x 3, Strength and sensation are intact Psych: euthymic mood, full affect  Wt Readings from Last 3 Encounters:  01/02/22 224 lb 13.9 oz (102 kg)  09/17/21 224 lb 12.8 oz (102 kg)  08/04/21 230 lb 1.3 oz (104.4 kg)      Studies/Labs Reviewed:   EKG:  EKG is*** ordered today.  The ekg ordered today demonstrates ***  Recent Labs: 01/02/2022: ALT 27 01/05/2022: B Natriuretic Peptide 130.1 01/06/2022: BUN 20; Creatinine, Ser 0.59; Hemoglobin 10.8; Magnesium 1.8; Platelets 243; Potassium 3.8; Sodium 139   Lipid Panel    Component Value Date/Time   CHOL 197 07/18/2015 1113   TRIG 144 07/18/2015 1113   HDL 41 07/18/2015 1113   CHOLHDL 4.8 (H) 07/18/2015 1113   CHOLHDL 3.1 03/07/2014 0343   VLDL 40 03/07/2014 0343   LDLCALC 127 (H) 07/18/2015 1113    Additional studies/ records that were reviewed today include:  Echo 01/03/22 IMPRESSIONS     1. Septal and apical hypokinesis . Left ventricular ejection fraction, by  estimation, is 40 to 45%. The left ventricle has mildly decreased  function. The left ventricle demonstrates regional wall motion  abnormalities (see  scoring diagram/findings for  description). The left ventricular internal cavity size was mildly  dilated. Left ventricular diastolic parameters are indeterminate.   2. Right ventricular systolic function is normal. The right ventricular  size is normal.   3. The mitral valve is normal in structure. No evidence of mitral valve  regurgitation.   4. The aortic valve is tricuspid. There is mild calcification of the  aortic valve. Aortic valve regurgitation is not visualized. Aortic valve  sclerosis is present, with no evidence of aortic valve stenosis.   FINDINGS   Left Ventricle: Septal and apical hypokinesis. Left ventricular ejection  fraction, by estimation, is 40 to 45%. The left ventricle has mildly  decreased function. The left ventricle demonstrates regional wall motion  abnormalities. The left ventricular  internal cavity size was mildly dilated. There is no left ventricular  hypertrophy. Left ventricular diastolic parameters are indeterminate.   Right Ventricle: The right ventricular size is normal. No increase in  right ventricular wall thickness. Right ventricular systolic function is  normal.   Left Atrium: Left atrial size was normal in size.   Right Atrium: Right atrial size was normal in size.   Pericardium: There is no evidence of pericardial effusion.   Mitral Valve: The mitral valve is normal in structure. No evidence of  mitral valve regurgitation.   Tricuspid Valve: The tricuspid valve is not well visualized. Tricuspid  valve regurgitation is mild.   Aortic Valve: The aortic valve is tricuspid. There is mild calcification  of the aortic valve. Aortic valve regurgitation is not visualized. Aortic  valve sclerosis is present, with no evidence of aortic valve stenosis.   Pulmonic Valve: The pulmonic valve was not well visualized. Pulmonic valve  regurgitation is not visualized.   Aorta: The aortic root is normal in size and structure.   IAS/Shunts: The  interatrial septum was not well visualized.    Risk Assessment/Calculations:   {Does this patient have ATRIAL FIBRILLATION?:[id number]}     ASSESSMENT:    No diagnosis found.   PLAN:  In order of problems listed above:  NSTEMI in the setting of cocaine, ETOH and benzodiazepines. Medical treatment with Plavix and ASA for one year. Cath only if symptoms  and drug free.  CM EF 45%-ARB  HTN  Tobacco abuse  COPD on 3L O2  Cocaine use  Shared Decision Making/Informed Consent   {Are you ordering a CV Procedure (e.g. stress test, cath, DCCV, TEE, etc)?   Press F2        :[id number]}    Medication Adjustments/Labs and Tests Ordered: Current medicines are reviewed at length with the patient today.  Concerns regarding medicines are outlined above.  Medication changes, Labs and Tests ordered today are listed in the Patient Instructions below. There are no Patient Instructions on file for this visit.   Sumner Boast, PA-C  01/07/2022 8:30 AM    [facility] [address], [place], [place]  [address] Phone: [phone]; Fax: [phone]

## 2022-01-12 ENCOUNTER — Ambulatory Visit (INDEPENDENT_AMBULATORY_CARE_PROVIDER_SITE_OTHER): Payer: Medicare Other

## 2022-01-12 ENCOUNTER — Encounter: Payer: Self-pay | Admitting: Adult Health

## 2022-01-12 ENCOUNTER — Ambulatory Visit (INDEPENDENT_AMBULATORY_CARE_PROVIDER_SITE_OTHER): Payer: Medicare Other | Admitting: Adult Health

## 2022-01-12 DIAGNOSIS — F1910 Other psychoactive substance abuse, uncomplicated: Secondary | ICD-10-CM

## 2022-01-12 DIAGNOSIS — J9611 Chronic respiratory failure with hypoxia: Secondary | ICD-10-CM

## 2022-01-12 DIAGNOSIS — R911 Solitary pulmonary nodule: Secondary | ICD-10-CM | POA: Insufficient documentation

## 2022-01-12 DIAGNOSIS — R0602 Shortness of breath: Secondary | ICD-10-CM

## 2022-01-12 DIAGNOSIS — F17200 Nicotine dependence, unspecified, uncomplicated: Secondary | ICD-10-CM | POA: Diagnosis not present

## 2022-01-12 DIAGNOSIS — J449 Chronic obstructive pulmonary disease, unspecified: Secondary | ICD-10-CM | POA: Diagnosis not present

## 2022-01-12 MED ORDER — PREDNISONE 5 MG PO TABS: ORAL_TABLET | ORAL | 1 refills | Status: DC

## 2022-01-12 NOTE — Assessment & Plan Note (Signed)
Patient is encouraged on cessation.  Advised to follow-up with her psychiatrist.  Also encouraged on support group

## 2022-01-12 NOTE — Assessment & Plan Note (Signed)
Chest x-ray shows a 2.4 cm left apex nodule.  Set up CT chest with contrast.

## 2022-01-12 NOTE — Patient Instructions (Addendum)
Set up CT chest with contrast. [facility] )  Mucinex DM Twice daily  As needed  cough/congestion  Tessalon Three times a day  As needed  cough .  Decrease Prednisone 15mg  daily for 1 week then 10mg  daily for 1 week and 5mg  daily and hold at this dose.  Continue on Breztri 2 puffs twice daily, rinse after use Albuterol inhaler or nebulizer as needed Continue on oxygen 4 to 5 L to maintain O2 saturations greater than 88 to 90% Need to set up follow up with Psychiatrist in next week  Look at the support group for addiction.  Follow up in 3 week with Nyhla Mountjoy NP or Dr Melvyn Novas .  Please contact office for sooner follow up if symptoms do not improve or worsen or seek emergency care

## 2022-01-12 NOTE — Assessment & Plan Note (Signed)
Recent COPD exacerbation seems to be improving.  We will start to slowly decrease prednisone.  Would like to get her off prednisone if possible as it does intensify her mood swings.  Plan  . Patient Instructions  Set up CT chest with contrast. [facility] )  Mucinex DM Twice daily  As needed  cough/congestion  Tessalon Three times a day  As needed  cough .  Decrease Prednisone 15mg  daily for 1 week then 10mg  daily for 1 week and 5mg  daily and hold at this dose.  Continue on Breztri 2 puffs twice daily, rinse after use Albuterol inhaler or nebulizer as needed Continue on oxygen 4 to 5 L to maintain O2 saturations greater than 88 to 90% Need to set up follow up with Psychiatrist in next week  Look at the support group for addiction.  Follow up in 3 week with Earl Zellmer NP or Dr Melvyn Novas .  Please contact office for sooner follow up if symptoms do not improve or worsen or seek emergency care

## 2022-01-12 NOTE — Progress Notes (Signed)
@Patient  ID: [name], female    DOB: Aug 16, 1955, 66 y.o.   MRN: [id number]  Chief Complaint  Patient presents with   Hospitalization Follow-up    Referring provider: Hyler, Gwen Her, NP  HPI: 66 year old female active smoker followed for severe COPD and oxygen dependent respiratory failure with baseline oxygen at 4 to 5 L/min.  Has been on oxygen since 2018 Medical history significant for bipolar disorder, polysubstance abuse  TEST/EVENTS :  PFTs July 29, 2020 FEV1 50%, ratio 77, FVC 49%, no significant bronchodilator response, DLCO 50%    01/12/2022 Follow up ; COPD , O2 RF , Post hospital follow up  Patient presents for a 1 month follow-up.  Patient has underlying severe COPD and oxygen dependent respiratory failure on oxygen 4 to 5 L..  She has recently been hospitalized for acute mental status changes felt secondary to substance abuse.  Patient was transported to the hospital via EMS secondary to excessive somnolence secondary to alcohol and cocaine abuse at home.  She required multiple doses of Ativan and Haldol for severe agitation.  She did require BiPAP support briefly .  urine drug screen was positive for cocaine and benzodiazepines.  CT head was negative for acute process.  Patient did have some decompensated congestive heart failure with elevated troponin levels.  Felt to be secondary to demand ischemia from cocaine use.  2D echo showed decreased EF at 45%.  Right wall motion abnormality.  She was started on heparin aspirin and Plavix.  Cardiology consulted.  And has a outpatient left heart cath planned. She was treated for suspected UTI.  Also treated for COPD flare with steroid burst and Z-Pak. Chest x-ray today shows a 2.4 cm nodular opacity in the left apex concerning for possible pulmonary nodule.  Patient continues to smoke.  Smoking cessation was discussed in detail Recently started on prednisone daily with a baseline at 10 mg on 12/10/21.  She is currently  on prednisone 20 mg.  Patient says that the prednisone does make her mood swings a little bit worse. Long discussion regarding cocaine use and underlying bipolar disorder.  Patient has been encouraged to follow back up with her psychiatrist.  Patient is currently trying to get into a support group.. Encouragement was provided. Since discharge she is feeling some better.  Decreased cough and congestion.  Patient gets short of breath with minimum activity. Patient continues on oxygen 4 to 5 L.     Allergies  Allergen Reactions   Penicillins Other (See Comments)    Patient is unsure if she allergic to penicillin or septra. Patient states one or another caused "rib pain with a little breathing problem". Has patient had a PCN reaction causing immediate rash, facial/tongue/throat swelling, SOB or lightheadedness with hypotension: YES Has patient had a PCN reaction causing severe rash involving mucus membranes or skin necrosis: NO Has patient had a PCN reaction that required hospitalization: NO Has patient had a PCN reaction occurring within the last 10 years: NO   Septra [Sulfamethoxazole-Trimethoprim] Other (See Comments)    Patient is unsure if she allergic to septra or penicillin. Patient states one or another caused "rib pain with a little breathing problem".    Immunization History  Administered Date(s) Administered   Influenza Whole 03/29/2019   Influenza-Unspecified 03/06/2019, 04/13/2021   Moderna Sars-Covid-2 Vaccination 02/22/2020, 04/18/2020   Pneumococcal Polysaccharide-23 07/06/2014   Tdap 11/30/2013    Past Medical History:  Diagnosis Date   Anemia    Anxiety  Arthritis    Bipolar 1 disorder (HCC)    CHF (congestive heart failure) (HCC)    COPD (chronic obstructive pulmonary disease) (HCC)    Depression    Diabetes mellitus without complication (HCC)    Dyspnea    Emphysema of lung (HCC)    GERD (gastroesophageal reflux disease)    Headache    migraines    History of hiatal hernia    History of kidney stones    Hyperlipidemia    Hypertension    Neuromuscular disorder (HCC)    neuropathy   On home oxygen therapy    Pneumonia 2015, 2019   Tracheomalacia    Vaginal Pap smear, abnormal     Tobacco History: Social History   Tobacco Use  Smoking Status Every Day   Packs/day: 2.00   Years: 52.00   Total pack years: 104.00   Types: Cigarettes   Start date: 1969  Smokeless Tobacco Never  Tobacco Comments   Smoking less.  Not buying anymore cigarettes.  01/12/22 hfb   Ready to quit: Not Answered Counseling given: Not Answered Tobacco comments: Smoking less.  Not buying anymore cigarettes.  01/12/22 hfb   Outpatient Medications Prior to Visit  Medication Sig Dispense Refill   acetaminophen (TYLENOL) 500 MG tablet Take 1 tablet (500 mg total) by mouth every 6 (six) hours as needed. 30 tablet 0   albuterol (PROVENTIL) (2.5 MG/3ML) 0.083% nebulizer solution INHALE 1 VIAL VIA NEBULIZER EVERY 4 HOURS (Patient taking differently: Take 2.5 mg by nebulization every 4 (four) hours as needed for wheezing or shortness of breath.) 360 mL 2   albuterol (VENTOLIN HFA) 108 (90 Base) MCG/ACT inhaler INHALE 1 OR 2 PUFFS BY MOUTH EVERY 6 HOURS AS NEEDED FOR WHEEZING OR SHORTNESS OF BREATH (Patient taking differently: Inhale 2 puffs into the lungs every 6 (six) hours as needed for wheezing or shortness of breath.) 8.5 g 5   aspirin EC 81 MG tablet Take 1 tablet (81 mg total) by mouth daily. Swallow whole. 30 tablet 2   Budeson-Glycopyrrol-Formoterol (BREZTRI AEROSPHERE) 160-9-4.8 MCG/ACT AERO Inhale 2 puffs into the lungs 2 (two) times daily. 10.7 g 1   cetirizine (ZYRTEC) 10 MG tablet Take 10 mg by mouth daily as needed for allergies.      clopidogrel (PLAVIX) 75 MG tablet Take 1 tablet (75 mg total) by mouth daily. 30 tablet 2   cyclobenzaprine (FLEXERIL) 10 MG tablet Take 10 mg by mouth 3 (three) times daily as needed for muscle spasms.     FLUoxetine  (PROZAC) 20 MG capsule Take 20 mg by mouth daily.     folic acid (FOLVITE) 1 MG tablet Take 1 tablet (1 mg total) by mouth daily. 30 tablet 1   gabapentin (NEURONTIN) 800 MG tablet Take 1 tablet (800 mg total) by mouth 3 (three) times daily as needed.     hydrOXYzine (VISTARIL) 50 MG capsule TAKE 1 CAPSULE BY MOUTH THREE TIMES DAILY AS NEEDED FOR ANXIETY (Patient taking differently: Take 50 mg by mouth 3 (three) times daily as needed for anxiety.) 90 capsule 0   ipratropium (ATROVENT) 0.02 % nebulizer solution Take 2.5 mLs by nebulization every 4 (four) hours as needed for wheezing or shortness of breath.     Ipratropium-Albuterol (COMBIVENT RESPIMAT) 20-100 MCG/ACT AERS respimat 1 puff as needed     meloxicam (MOBIC) 15 MG tablet Take 15 mg by mouth daily.     metFORMIN (GLUCOPHAGE) 1000 MG tablet Take 1,000 mg by mouth 2 (two) times  daily.     nitroGLYCERIN (NITROSTAT) 0.4 MG SL tablet Place under the tongue.     olmesartan (BENICAR) 20 MG tablet Take 1 tablet (20 mg total) by mouth daily. 30 tablet 1   omeprazole (PRILOSEC) 20 MG capsule Take 20 mg by mouth daily.     ondansetron (ZOFRAN-ODT) 4 MG disintegrating tablet Take 4 mg by mouth 2 (two) times daily as needed for nausea/vomiting.     oxybutynin (DITROPAN-XL) 5 MG 24 hr tablet Take 5 mg by mouth daily.     predniSONE (DELTASONE) 20 MG tablet Take 2 pills daily for 3 days then continue taking 1 pill daily until follow up with your pulmonologist 30 tablet 0   promethazine (PHENERGAN) 25 MG tablet Take 25 mg by mouth 3 (three) times daily as needed for nausea/vomiting.     rOPINIRole (REQUIP) 1 MG tablet Take 1 mg by mouth 3 (three) times daily.      rosuvastatin (CRESTOR) 10 MG tablet Take 1 tablet (10 mg total) by mouth daily. 30 tablet 1   simvastatin (ZOCOR) 20 MG tablet Take 20 mg by mouth daily.     SUMAtriptan (IMITREX) 25 MG tablet Take 25 mg by mouth every 2 (two) hours as needed for migraine.     thiamine 100 MG tablet Take 1  tablet (100 mg total) by mouth daily. 30 tablet 1   torsemide (DEMADEX) 20 MG tablet Take 1 tablet (20 mg total) by mouth daily as needed. May take an additional 20 mg daily as needed for extreme swelling. 30 tablet 0   No facility-administered medications prior to visit.     Review of Systems:   Constitutional:   No  weight loss, night sweats,  Fevers, chills,  +fatigue, or  lassitude.  HEENT:   No headaches,  Difficulty swallowing,  Tooth/dental problems, or  Sore throat,                No sneezing, itching, ear ache,  +nasal congestion, post nasal drip,   CV:  No chest pain,  Orthopnea, PND, swelling in lower extremities, anasarca, dizziness, palpitations, syncope.   GI  No heartburn, indigestion, abdominal pain, nausea, vomiting, diarrhea, change in bowel habits, loss of appetite, bloody stools.   Resp: .  No chest wall deformity  Skin: no rash or lesions.  GU: no dysuria, change in color of urine, no urgency or frequency.  No flank pain, no hematuria   MS:  No joint pain or swelling.  No decreased range of motion.  No back pain.    Physical Exam  BP (!) 100/50 (BP Location: Left Arm, Patient Position: Sitting, Cuff Size: Large)   Pulse (!) 103   Temp 98.1 F (36.7 C) (Oral)   Ht 5\' 4"  (1.626 m)   Wt 206 lb 9.6 oz (93.7 kg)   SpO2 92%   BMI 35.46 kg/m   GEN: A/Ox3; pleasant , NAD, well nourished    HEENT:  [place]/AT,  EACs-clear, TMs-wnl, NOSE-clear, THROAT-clear, no lesions, no postnasal drip or exudate noted.   NECK:  Supple w/ fair ROM; no JVD; normal carotid impulses w/o bruits; no thyromegaly or nodules palpated; no lymphadenopathy.    RESP  Clear  P & A; w/o, wheezes/ rales/ or rhonchi. no accessory muscle use, no dullness to percussion  CARD:  RRR, no m/r/g, no peripheral edema, pulses intact, no cyanosis or clubbing.  GI:   Soft & nt; nml bowel sounds; no organomegaly or masses detected.   Musco:  Warm bil, no deformities or joint swelling noted.    Neuro: alert, no focal deficits noted.    Skin: Warm, no lesions or rashes    Lab Results:  CBC    Component Value Date/Time   WBC 18.6 (H) 01/06/2022 0118   RBC 3.42 (L) 01/06/2022 0118   HGB 10.8 (L) 01/06/2022 0118   HGB 15.7 07/18/2015 1113   HCT 32.8 (L) 01/06/2022 0118   HCT 44.8 07/18/2015 1113   PLT 243 01/06/2022 0118   PLT 250 07/18/2015 1113   MCV 95.9 01/06/2022 0118   MCV 96 07/18/2015 1113   MCH 31.6 01/06/2022 0118   MCHC 32.9 01/06/2022 0118   RDW 12.9 01/06/2022 0118   RDW 14.5 07/18/2015 1113   LYMPHSABS 2.4 01/02/2022 0451   LYMPHSABS 1.6 07/18/2015 1113   MONOABS 1.9 (H) 01/02/2022 0451   EOSABS 0.0 01/02/2022 0451   EOSABS 0.2 07/18/2015 1113   BASOSABS 0.1 01/02/2022 0451   BASOSABS 0.0 07/18/2015 1113    BMET    Component Value Date/Time   NA 139 01/06/2022 0118   NA 140 07/18/2015 1113   K 3.8 01/06/2022 0118   CL 94 (L) 01/06/2022 0118   CO2 32 01/06/2022 0118   GLUCOSE 135 (H) 01/06/2022 0118   BUN 20 01/06/2022 0118   BUN 11 07/18/2015 1113   CREATININE 0.59 01/06/2022 0118   CREATININE 0.57 11/20/2014 1311   CALCIUM 10.8 (H) 01/06/2022 0118   GFRNONAA >60 01/06/2022 0118   GFRAA >60 01/23/2020 1119    BNP    Component Value Date/Time   BNP 130.1 (H) 01/05/2022 0045    ProBNP    Component Value Date/Time   PROBNP 243.2 (H) 04/28/2014 0026    Imaging: DG Chest 2 View  Result Date: 01/12/2022 CLINICAL DATA:  Provided history: Shortness of breath. EXAM: CHEST - 2 VIEW COMPARISON:  Prior chest radiographs 01/03/2022 and earlier. Prior chest CT 09/18/2019. FINDINGS: Heart size within normal limits. Aortic atherosclerosis. Apparent 2.4 cm nodular opacity within the left lung apex (see annotation on PA radiograph). Elsewhere, there is no appreciable airspace consolidation. No evidence of pleural effusion or pneumothorax. No acute bony abnormality identified. Dextrocurvature of the mid thoracic spine. IMPRESSION: Apparent 2.4  cm nodular opacity within the left lung apex concerning for a pulmonary nodule. A chest CT is recommended for further evaluation. Elsewhere, there is no appreciable airspace consolidation. Aortic Atherosclerosis (ICD10-I70.0). Dextrocurvature of the midthoracic spine. Electronically Signed   By: Kellie Simmering D.O.   On: 01/12/2022 11:29   ECHOCARDIOGRAM COMPLETE  Result Date: 01/03/2022    ECHOCARDIOGRAM REPORT   Patient Name:   [name] Date of Exam: 01/03/2022 Medical Rec #:  [id number]             Height:       64.0 in Accession #:    [id number]            Weight:       224.9 lb Date of Birth:  1956-04-22            BSA:          2.056 m Patient Age:    18 years              BP:           125/75 mmHg Patient Gender: F                     HR:  109 bpm. Exam Location:  Inpatient Procedure: 2D Echo Indications:    NSTEMI  History:        Patient has prior history of Echocardiogram examinations, most                 recent 06/14/2019. COPD; Risk Factors:Hypertension,                 Dyslipidemia, Diabetes, polysubstance abuse and Current Smoker.  Sonographer:    Johny Chess RDCS Referring Phys: [phone] JACOB J STINSON  Sonographer Comments: Image acquisition challenging due to respiratory motion and Image acquisition challenging due to COPD. IMPRESSIONS  1. Septal and apical hypokinesis . Left ventricular ejection fraction, by estimation, is 40 to 45%. The left ventricle has mildly decreased function. The left ventricle demonstrates regional wall motion abnormalities (see scoring diagram/findings for description). The left ventricular internal cavity size was mildly dilated. Left ventricular diastolic parameters are indeterminate.  2. Right ventricular systolic function is normal. The right ventricular size is normal.  3. The mitral valve is normal in structure. No evidence of mitral valve regurgitation.  4. The aortic valve is tricuspid. There is mild calcification of the aortic valve.  Aortic valve regurgitation is not visualized. Aortic valve sclerosis is present, with no evidence of aortic valve stenosis. FINDINGS  Left Ventricle: Septal and apical hypokinesis. Left ventricular ejection fraction, by estimation, is 40 to 45%. The left ventricle has mildly decreased function. The left ventricle demonstrates regional wall motion abnormalities. The left ventricular internal cavity size was mildly dilated. There is no left ventricular hypertrophy. Left ventricular diastolic parameters are indeterminate. Right Ventricle: The right ventricular size is normal. No increase in right ventricular wall thickness. Right ventricular systolic function is normal. Left Atrium: Left atrial size was normal in size. Right Atrium: Right atrial size was normal in size. Pericardium: There is no evidence of pericardial effusion. Mitral Valve: The mitral valve is normal in structure. No evidence of mitral valve regurgitation. Tricuspid Valve: The tricuspid valve is not well visualized. Tricuspid valve regurgitation is mild. Aortic Valve: The aortic valve is tricuspid. There is mild calcification of the aortic valve. Aortic valve regurgitation is not visualized. Aortic valve sclerosis is present, with no evidence of aortic valve stenosis. Pulmonic Valve: The pulmonic valve was not well visualized. Pulmonic valve regurgitation is not visualized. Aorta: The aortic root is normal in size and structure. IAS/Shunts: The interatrial septum was not well visualized.  LEFT VENTRICLE PLAX 2D LVIDd:         3.50 cm LVIDs:         2.30 cm LV PW:         1.10 cm LV IVS:        1.00 cm LVOT diam:     1.80 cm LV SV:         39 LV SV Index:   19 LVOT Area:     2.54 cm  LV Volumes (MOD) LV vol d, MOD A4C: 75.4 ml LV vol s, MOD A4C: 42.0 ml LV SV MOD A4C:     75.4 ml RIGHT VENTRICLE             IVC RV S prime:     12.20 cm/s  IVC diam: 2.30 cm TAPSE (M-mode): 2.4 cm LEFT ATRIUM             Index        RIGHT ATRIUM           Index LA  diam:  3.10 cm 1.51 cm/m   RA Area:     13.50 cm LA Vol (A2C):   51.4 ml 25.00 ml/m  RA Volume:   31.20 ml  15.17 ml/m LA Vol (A4C):   46.5 ml 22.61 ml/m LA Biplane Vol: 50.9 ml 24.75 ml/m  AORTIC VALVE LVOT Vmax:   94.70 cm/s LVOT Vmean:  63.600 cm/s LVOT VTI:    0.153 m  AORTA Ao Root diam: 2.90 cm Ao Asc diam:  3.40 cm  SHUNTS Systemic VTI:  0.15 m Systemic Diam: 1.80 cm Jenkins Rouge MD Electronically signed by Jenkins Rouge MD Signature Date/Time: 01/03/2022/6:36:16 PM    Final    DG Chest Port 1 View  Result Date: 01/03/2022 CLINICAL DATA:  66 year old female with history of acute respiratory distress. EXAM: PORTABLE CHEST 1 VIEW COMPARISON:  Chest x-ray 01/02/2022. FINDINGS: There is cephalization of the pulmonary vasculature and slight indistinctness of the interstitial markings suggestive of mild pulmonary edema. No definite pleural effusions. No pneumothorax. No evidence heart size is mildly enlarged. Upper mediastinal contours are within normal limits. Atherosclerotic calcifications in the thoracic aorta. IMPRESSION: 1. The appearance the chest suggests mild congestive heart failure, as above. Electronically Signed   By: Vinnie Langton M.D.   On: 01/03/2022 08:20   DG Chest 1V REPEAT Same Day  Result Date: 01/02/2022 CLINICAL DATA:  Altered mental status. EXAM: CHEST - 1 VIEW SAME DAY COMPARISON:  01/02/2022. FINDINGS: Cardiac silhouette is normal in size. No mediastinal or hilar masses. Prominent bronchovascular/interstitial markings, stable. No lung consolidation or convincing edema. No pleural effusion or pneumothorax. Skeletal structures are demineralized, but grossly intact. IMPRESSION: No acute cardiopulmonary disease. Electronically Signed   By: Lajean Manes M.D.   On: 01/02/2022 17:21   CT Head Wo Contrast  Result Date: 01/02/2022 CLINICAL DATA:  Mental status change. Unknown cause. History of illicit drug use. EXAM: CT HEAD WITHOUT CONTRAST TECHNIQUE: Contiguous axial images  were obtained from the base of the skull through the vertex without intravenous contrast. RADIATION DOSE REDUCTION: This exam was performed according to the departmental dose-optimization program which includes automated exposure control, adjustment of the mA and/or kV according to patient size and/or use of iterative reconstruction technique. COMPARISON:  Head CT 07/15/2014. FINDINGS: Despite efforts by the technologist and patient, moderate to severe motion artifact is present on today's exam and could not be eliminated. This reduces exam sensitivity and specificity. Multiple images were repeated. Brain: There is no evidence of acute intracranial hemorrhage, mass lesion, brain edema or extra-axial fluid collection. The ventricles and subarachnoid spaces are appropriately sized for age. There is no CT evidence of acute cortical infarction. Stable mild chronic low-density in the left frontal periventricular white matter. Vascular: Intracranial vascular calcifications. No hyperdense vessel identified. Skull: Negative for fracture or focal lesion. Sinuses/Orbits: Possible small fluid level in the right division of the sphenoid sinus. The additional paranasal sinuses, mastoid air cells and middle ears are clear. No orbital abnormalities are seen. Other: None. IMPRESSION: 1. Significantly motion degraded examination demonstrates no definite acute intracranial findings. 2. Possible small fluid level within the right sphenoid sinus. No acute osseous findings identified. Electronically Signed   By: Richardean Sale M.D.   On: 01/02/2022 14:28   DG Chest Port 1 View  Result Date: 01/02/2022 CLINICAL DATA:  66 year old female with shortness of breath and altered mental status. EXAM: PORTABLE CHEST 1 VIEW COMPARISON:  Chest radiographs 04/29/2021 and earlier. FINDINGS: Portable AP semi upright view at 0754 hours. Stable lung volumes,  mediastinal contours remain within normal limits. Visualized tracheal air column is within  normal limits. Chronic coarse bilateral pulmonary interstitial opacity appears stable. No superimposed pneumothorax, pulmonary edema, or acute pulmonary opacity. No acute osseous abnormality identified. IMPRESSION: Chronic pulmonary interstitial disease. No acute cardiopulmonary abnormality. Electronically Signed   By: Genevie Ann M.D.   On: 01/02/2022 08:22         Latest Ref Rng & Units 07/29/2020    9:06 AM 07/14/2016    9:05 AM 12/06/2013   10:00 AM  PFT Results  FVC-Pre L 1.48  1.48    FVC-Predicted Pre % 46  45  52      FVC-Post L 1.58   1.93      FVC-Predicted Post % 49   57      Pre FEV1/FVC % % 81  87  82      Post FEV1/FCV % % 77   82      FEV1-Pre L 1.19  1.29  1.46      FEV1-Predicted Pre % 49  51  55      FEV1-Post L 1.22   1.58      DLCO uncorrected ml/min/mmHg 10.04   14.71      DLCO UNC% % 50   60      DLCO corrected ml/min/mmHg 10.23   14.75      DLCO COR %Predicted % 51   60      DLVA Predicted % 82   100      TLC L 4.94   3.73      TLC % Predicted % 97   73      RV % Predicted % 158   99         This result is from an external source.    No results found for: "NITRICOXIDE"      Assessment & Plan:   COPD GOLD 2/ still smoker  Recent COPD exacerbation seems to be improving.  We will start to slowly decrease prednisone.  Would like to get her off prednisone if possible as it does intensify her mood swings.  Plan  . Patient Instructions  Set up CT chest with contrast. [facility] )  Mucinex DM Twice daily  As needed  cough/congestion  Tessalon Three times a day  As needed  cough .  Decrease Prednisone 15mg  daily for 1 week then 10mg  daily for 1 week and 5mg  daily and hold at this dose.  Continue on Breztri 2 puffs twice daily, rinse after use Albuterol inhaler or nebulizer as needed Continue on oxygen 4 to 5 L to maintain O2 saturations greater than 88 to 90% Need to set up follow up with Psychiatrist in next week  Look at the support group for  addiction.  Follow up in 3 week with Luther Newhouse NP or Dr Melvyn Novas .  Please contact office for sooner follow up if symptoms do not improve or worsen or seek emergency care      Polysubstance abuse [facility]) Patient is encouraged on cessation.  Advised to follow-up with her psychiatrist.  Also encouraged on support group  Tobacco use disorder We discussed smoking cessation in detail  Chronic hypoxemic respiratory failure ([place]) Continue on oxygen to maintain O2 saturation greater than 88 to 90%  Lung nodule Chest x-ray shows a 2.4 cm left apex nodule.  Set up CT chest with contrast.     Rexene Edison, NP 01/12/2022

## 2022-01-12 NOTE — Assessment & Plan Note (Signed)
Continue on oxygen to maintain O2 saturation greater than 88 to 90%

## 2022-01-12 NOTE — Assessment & Plan Note (Signed)
We discussed smoking cessation in detail

## 2022-01-13 ENCOUNTER — Ambulatory Visit: Payer: Medicare Other | Admitting: Physician Assistant

## 2022-01-13 DIAGNOSIS — I429 Cardiomyopathy, unspecified: Secondary | ICD-10-CM

## 2022-01-13 DIAGNOSIS — Z720 Tobacco use: Secondary | ICD-10-CM

## 2022-01-13 DIAGNOSIS — I2510 Atherosclerotic heart disease of native coronary artery without angina pectoris: Secondary | ICD-10-CM

## 2022-01-13 DIAGNOSIS — J449 Chronic obstructive pulmonary disease, unspecified: Secondary | ICD-10-CM

## 2022-01-13 DIAGNOSIS — I10 Essential (primary) hypertension: Secondary | ICD-10-CM

## 2022-01-14 ENCOUNTER — Ambulatory Visit: Payer: Medicare Other | Admitting: Podiatry

## 2022-01-18 NOTE — Progress Notes (Deleted)
Office Visit    Patient Name: [name] Date of Encounter: 01/18/2022  PCP:  Beverly Milch, NP   [place]  Cardiologist:  Carlyle Dolly, MD  Advanced Practice Provider:  No care team member to display Electrophysiologist:  None   HPI    [name] is a 66 y.o. female with a hx of COPD, chronic hypoxic respiratory failure on home oxygen, hypertension, polysubstance abuse, GERD, type 2 diabetes mellitus presents today for hospital follow-up.  She was recently brought to the emergency department 01/03/2019 for evaluation of somnolence secondary to alcohol/cocaine abuse at home.  Patient was brought in by her husband through EMS.  Per EMS, there were several bottles of prescription medication that did not belong to her and patient was having uncontrollable movements and slurred speech.  Patient was given multiple doses of Ativan, Haldol to calm her down.  She was hypertensive at presentation.  She was put on BiPAP due to her increased work of breathing and was placed in the ICU.  Today, she ***  Past Medical History    Past Medical History:  Diagnosis Date   Anemia    Anxiety    Arthritis    Bipolar 1 disorder (HCC)    CHF (congestive heart failure) (HCC)    COPD (chronic obstructive pulmonary disease) (HCC)    Depression    Diabetes mellitus without complication (HCC)    Dyspnea    Emphysema of lung (HCC)    GERD (gastroesophageal reflux disease)    Headache    migraines   History of hiatal hernia    History of kidney stones    Hyperlipidemia    Hypertension    Neuromuscular disorder (HCC)    neuropathy   On home oxygen therapy    Pneumonia 2015, 2019   Tracheomalacia    Vaginal Pap smear, abnormal    Past Surgical History:  Procedure Laterality Date   CATARACT EXTRACTION W/PHACO Left 01/14/2020   Procedure: CATARACT EXTRACTION PHACO AND INTRAOCULAR LENS PLACEMENT LEFT EYE;  Surgeon: Baruch Goldmann, MD;  [facility];  Service: Ophthalmology;  Laterality: Left;  CDE: 8.18   CATARACT EXTRACTION W/PHACO Right 02/01/2020   Procedure: CATARACT EXTRACTION PHACO AND INTRAOCULAR LENS PLACEMENT RIGHT EYE;  Surgeon: Baruch Goldmann, MD;  Location: [facility];  Service: Ophthalmology;  Laterality: Right;  CDE: 9.94   CHEILECTOMY Right 01/28/2020   Procedure: KELLER BUNION IMPLANT RIGHT FOOT CHEILECTOMY RIGHT ROOT;  Surgeon: Landis Martins, DPM;  Location: [place];  Service: Podiatry;  Laterality: Right;  BLOCK   CHOLECYSTECTOMY     COLONOSCOPY  July 2010   Dr. Arnoldo Morale: 3 rectal polyps, not enough tissue for pathologic examination, recommended surveillance in 3 years   COLONOSCOPY N/A 10/23/2014   RMR: Multiple colonic polyps removed as described above. No endoscopic explaniation for abdominal pain. however. next tcs 10/2019   ESOPHAGOGASTRODUODENOSCOPY  July 2010   Dr. Arnoldo Morale: gastritis and duodenitis, H.pylori negative   ESOPHAGOGASTRODUODENOSCOPY N/A 10/23/2014   RMR: Normal EGD. Status post passage of a Maloney dilator. Today's finding s would not explain abdominal pain   EYE SURGERY Left 01/14/2020   cataract removal   GANGLION CYST EXCISION Left 09/07/2018   Procedure: REMOVAL GANGLION OF WRIST;  Surgeon: Carole Civil, MD;  Location: [facility];  Service: Orthopedics;  Laterality: Left;   HERNIA REPAIR     Dr. Arnoldo Morale   KIDNEY STONE SURGERY     MALONEY DILATION N/A 10/23/2014  Procedure: MALONEY DILATION;  Surgeon: Daneil Dolin, MD;  [facility];  Service: Endoscopy;  Laterality: N/A;   OPEN REDUCTION INTERNAL FIXATION (ORIF) DISTAL RADIAL FRACTURE Right 12/09/2015   Procedure: OPEN REDUCTION INTERNAL FIXATION (ORIF) RIGHT DISTAL RADIUS;  Surgeon: Leanora Cover, MD;  Location: [facility];  Service: Orthopedics;  Laterality: Right;    Allergies  Allergies  Allergen Reactions   Penicillins Other (See Comments)    Patient is unsure if she allergic to penicillin or septra. Patient  states one or another caused "rib pain with a little breathing problem". Has patient had a PCN reaction causing immediate rash, facial/tongue/throat swelling, SOB or lightheadedness with hypotension: YES Has patient had a PCN reaction causing severe rash involving mucus membranes or skin necrosis: NO Has patient had a PCN reaction that required hospitalization: NO Has patient had a PCN reaction occurring within the last 10 years: NO   Septra [Sulfamethoxazole-Trimethoprim] Other (See Comments)    Patient is unsure if she allergic to septra or penicillin. Patient states one or another caused "rib pain with a little breathing problem".     EKGs/Labs/Other Studies Reviewed:   The following studies were reviewed today: ***  EKG:  EKG is *** ordered today.  The ekg ordered today demonstrates ***  Recent Labs: 01/02/2022: ALT 27 01/05/2022: B Natriuretic Peptide 130.1 01/06/2022: BUN 20; Creatinine, Ser 0.59; Hemoglobin 10.8; Magnesium 1.8; Platelets 243; Potassium 3.8; Sodium 139  Recent Lipid Panel    Component Value Date/Time   CHOL 197 07/18/2015 1113   TRIG 144 07/18/2015 1113   HDL 41 07/18/2015 1113   CHOLHDL 4.8 (H) 07/18/2015 1113   CHOLHDL 3.1 03/07/2014 0343   VLDL 40 03/07/2014 0343   LDLCALC 127 (H) 07/18/2015 1113    Risk Assessment/Calculations:  {Does this patient have ATRIAL FIBRILLATION?:[phone]}  Home Medications   No outpatient medications have been marked as taking for the 01/19/22 encounter (Appointment) with Elgie Collard, PA-C.     Review of Systems   ***   All other systems reviewed and are otherwise negative except as noted above.  Physical Exam    VS:  There were no vitals taken for this visit. , BMI There is no height or weight on file to calculate BMI.  Wt Readings from Last 3 Encounters:  01/12/22 206 lb 9.6 oz (93.7 kg)  01/02/22 224 lb 13.9 oz (102 kg)  09/17/21 224 lb 12.8 oz (102 kg)     GEN: Well nourished, well developed, in no acute  distress. HEENT: normal. Neck: Supple, no JVD, carotid bruits, or masses. Cardiac: ***RRR, no murmurs, rubs, or gallops. No clubbing, cyanosis, edema.  ***Radials/PT 2+ and equal bilaterally.  Respiratory:  ***Respirations regular and unlabored, clear to auscultation bilaterally. GI: Soft, nontender, nondistended. MS: No deformity or atrophy. Skin: Warm and dry, no rash. Neuro:  Strength and sensation are intact. Psych: Normal affect.  Assessment & Plan    ***     Disposition: Follow up {follow up:15908} with Carlyle Dolly, MD or APP.  Signed, Elgie Collard, PA-C 01/18/2022, 11:01 AM [place]

## 2022-01-19 ENCOUNTER — Ambulatory Visit: Payer: Medicare Other | Admitting: Physician Assistant

## 2022-01-20 ENCOUNTER — Telehealth: Payer: Self-pay | Admitting: Adult Health

## 2022-01-20 NOTE — Telephone Encounter (Signed)
Called patient back and she states that when she got out of hospital she developed some blisters in her mouth. She is requesting some dukes magic mouth wash.   Please advise

## 2022-01-21 NOTE — Telephone Encounter (Signed)
Called and spoke with patient. She verbalized understanding. She stated that she had received a call from the pharmacy stating they had received the RX.   Nothing further needed at time of call.

## 2022-01-21 NOTE — Telephone Encounter (Signed)
[name], please advise if you are okay with [place] sending Rx to pharmacy for pt.

## 2022-01-21 NOTE — Telephone Encounter (Signed)
That is fine for Dukes Majic Mouthwash  1 tsp swish in mouth and spit Twice daily  as needed # 1 bottle  No refills.   Please contact office for sooner follow up if symptoms do not improve or worsen or seek emergency care

## 2022-01-22 ENCOUNTER — Ambulatory Visit: Payer: Medicare Other | Admitting: Internal Medicine
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
## 2022-01-25 ENCOUNTER — Ambulatory Visit (INDEPENDENT_AMBULATORY_CARE_PROVIDER_SITE_OTHER): Payer: Medicare Other | Admitting: Cardiology

## 2022-01-25 ENCOUNTER — Encounter: Payer: Self-pay | Admitting: Cardiology

## 2022-01-25 VITALS — BP 132/58 | HR 98 | Ht 64.0 in | Wt 202.4 lb

## 2022-01-25 DIAGNOSIS — J9611 Chronic respiratory failure with hypoxia: Secondary | ICD-10-CM

## 2022-01-25 DIAGNOSIS — I10 Essential (primary) hypertension: Secondary | ICD-10-CM | POA: Diagnosis not present

## 2022-01-25 DIAGNOSIS — I214 Non-ST elevation (NSTEMI) myocardial infarction: Secondary | ICD-10-CM

## 2022-01-25 DIAGNOSIS — I2510 Atherosclerotic heart disease of native coronary artery without angina pectoris: Secondary | ICD-10-CM | POA: Diagnosis not present

## 2022-01-25 DIAGNOSIS — Z720 Tobacco use: Secondary | ICD-10-CM | POA: Diagnosis not present

## 2022-01-25 DIAGNOSIS — E876 Hypokalemia: Secondary | ICD-10-CM

## 2022-01-25 DIAGNOSIS — N181 Chronic kidney disease, stage 1: Secondary | ICD-10-CM

## 2022-01-25 DIAGNOSIS — E1122 Type 2 diabetes mellitus with diabetic chronic kidney disease: Secondary | ICD-10-CM

## 2022-01-25 DIAGNOSIS — E785 Hyperlipidemia, unspecified: Secondary | ICD-10-CM

## 2022-01-25 DIAGNOSIS — F319 Bipolar disorder, unspecified: Secondary | ICD-10-CM

## 2022-01-25 MED ORDER — ROSUVASTATIN CALCIUM 10 MG PO TABS: 10.0000 mg | ORAL_TABLET | Freq: Every day | ORAL | 3 refills | Status: DC

## 2022-01-25 NOTE — Patient Instructions (Signed)
Medication Instructions:  Your physician recommends that you continue on your current medications as directed. Please refer to the Current Medication list given to you today.   Labwork: CBC BMET  Testing/Procedures: None  Follow-Up: Follow up with 2-3 weeks with Dr. Harl Bowie  Any Other Special Instructions Will Be Listed Below (If Applicable).  Call our office Wednesday with your decision for Heart Cath    If you need a refill on your cardiac medications before your next appointment, please call your pharmacy.

## 2022-01-27 ENCOUNTER — Other Ambulatory Visit: Payer: Self-pay | Admitting: Internal Medicine

## 2022-01-27 ENCOUNTER — Telehealth: Payer: Self-pay | Admitting: Cardiology

## 2022-01-27 NOTE — Telephone Encounter (Signed)
Pt called to inform Cecilie Kicks, NP that she did not want the heart cath because she having an upcoming CT.

## 2022-01-27 NOTE — Telephone Encounter (Signed)
I called pt about her CTA of chest and explained it would not help with any heart issues.  She tells me she would like to proceed with cath in 2 weeks.  So if we could call her later today or tomorrow and arrange. Please.  Just let me know date and time and MD.   Thank you.

## 2022-01-27 NOTE — Telephone Encounter (Signed)
Pt is scheduled for [facility] on 02/10/22 at 10am with Dr. Clydene Fake.

## 2022-01-28 ENCOUNTER — Ambulatory Visit (HOSPITAL_COMMUNITY): Admission: RE | Admit: 2022-01-28 | Payer: Medicare Other | Source: Ambulatory Visit

## 2022-01-29 ENCOUNTER — Ambulatory Visit: Payer: Medicare Other | Admitting: Podiatrist

## 2022-01-29 NOTE — Telephone Encounter (Signed)
Labs ordered when [facility] was ordered. Pt aware.

## 2022-02-01 ENCOUNTER — Telehealth: Payer: Self-pay | Admitting: Adult Health

## 2022-02-01 MED ORDER — COMBIVENT RESPIMAT 20-100 MCG/ACT IN AERS: INHALATION_SPRAY | RESPIRATORY_TRACT | 2 refills | Status: DC

## 2022-02-01 MED ORDER — DOXYCYCLINE HYCLATE 100 MG PO TABS: 100.0000 mg | ORAL_TABLET | Freq: Two times a day (BID) | ORAL | 0 refills | Status: DC

## 2022-02-01 NOTE — Telephone Encounter (Signed)
Called patient to inform her of the recommendations from TP. Patient verbalized understanding. Nothing further needed

## 2022-02-01 NOTE — Telephone Encounter (Signed)
Can begin Doxycycline 100mg  Twice daily  for 1 week , take with food  Wear sunscreen if out in the sun.  Can refill combivent  Keep follow up  Please contact office for sooner follow up if symptoms do not improve or worsen or seek emergency care

## 2022-02-01 NOTE — Telephone Encounter (Signed)
Pt states increased wheezing and productive cough (thick yellow) over last 3 days. Pt states she is already on steroids and thinks she need ABT added. Pt denies fever/ chills/ GI upset. Tammy please advise  FYI pt is also requesting Combivent inhaler refill.

## 2022-02-03 ENCOUNTER — Ambulatory Visit: Payer: Medicare Other | Admitting: Adult Health

## 2022-02-05 ENCOUNTER — Telehealth: Payer: Self-pay | Admitting: Cardiology

## 2022-02-05 NOTE — Telephone Encounter (Signed)
Pt would like a callback regarding scheduled procedure on 02/10/22. Pt states that she may need to reschedule. Please advise

## 2022-02-05 NOTE — Telephone Encounter (Signed)
Pt r/s with Dr. Claiborne Billings on 8/24 for Cardiac Cath. Pt notified and verbalized understanding.

## 2022-02-05 NOTE — Telephone Encounter (Signed)
Pt's HC was moved per pt's request. Pt aware that she needs labs drawn

## 2022-02-11 DIAGNOSIS — I219 Acute myocardial infarction, unspecified: Secondary | ICD-10-CM | POA: Insufficient documentation

## 2022-02-11 DIAGNOSIS — M6281 Muscle weakness (generalized): Secondary | ICD-10-CM | POA: Insufficient documentation

## 2022-02-12 ENCOUNTER — Encounter: Payer: Self-pay | Admitting: Podiatrist

## 2022-02-12 ENCOUNTER — Ambulatory Visit (INDEPENDENT_AMBULATORY_CARE_PROVIDER_SITE_OTHER): Payer: Medicare Other | Admitting: Podiatrist

## 2022-02-12 DIAGNOSIS — B351 Tinea unguium: Secondary | ICD-10-CM

## 2022-02-12 DIAGNOSIS — M79674 Pain in right toe(s): Secondary | ICD-10-CM

## 2022-02-12 NOTE — Progress Notes (Signed)
Chief Complaint  Patient presents with   Toe Pain    Trim thick nail/callus tip of 2nd toe right     HPI: Patient is 66 y.o. female who presents today for a painful right second toe where the nail grows long and thick.  No changed in [place], Meds or allergies reported since last visit.    Allergies  Allergen Reactions   Penicillins Other (See Comments)    Patient is unsure if she allergic to penicillin or septra. Patient states one or another caused "rib pain with a little breathing problem". Has patient had a PCN reaction causing immediate rash, facial/tongue/throat swelling, SOB or lightheadedness with hypotension: YES Has patient had a PCN reaction causing severe rash involving mucus membranes or skin necrosis: NO Has patient had a PCN reaction that required hospitalization: NO Has patient had a PCN reaction occurring within the last 10 years: NO   Septra [Sulfamethoxazole-Trimethoprim] Other (See Comments)    Patient is unsure if she allergic to septra or penicillin. Patient states one or another caused "rib pain with a little breathing problem".    Review of systems is negative except as noted in the HPI.  Denies nausea/ vomiting/ fevers/ chills or night sweats.   Denies difficulty breathing, denies calf pain or tenderness  Physical Exam  Patient is awake, alert, and oriented x 3.  In no acute distress.    Vascular status is intact with palpable pedal pulses 1/4 DP and PT bilateral and capillary refill time less than 3 seconds bilateral.  No edema or erythema noted.   Neurological exam reveals epicritic and protective sensation grossly intact bilateral.   Dermatological exam reveals skin is supple and dry to bilateral feet.  Right second toenail is thick and dystrophic.  Discoloration and discomfort with palpation and debridement noted. Small callus under the nail is also present.  Remainder of nails are asymptomatic at todays visit.   Musculoskeletal exam: Musculature intact with  dorsiflexion, plantarflexion, inversion, eversion. Ankle and First MPJ joint range of motion normal.     Assessment:   ICD-10-CM   1. Pain due to onychomycosis of toenail of right foot  B35.1    M79.674        Plan: Debridement of the right second toenail was accomplished today.  She will call in the future for further treatment.  We did discuss the possibility of permanent removal of this nail in the future as a potential treatment options as well.

## 2022-02-12 NOTE — Patient Instructions (Signed)
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
## 2022-02-15 ENCOUNTER — Ambulatory Visit: Payer: Medicare Other | Admitting: Cardiology

## 2022-02-15 ENCOUNTER — Telehealth: Payer: Self-pay | Admitting: Cardiology

## 2022-02-15 ENCOUNTER — Other Ambulatory Visit: Payer: Self-pay | Admitting: Internal Medicine

## 2022-02-15 DIAGNOSIS — Z01812 Encounter for preprocedural laboratory examination: Secondary | ICD-10-CM

## 2022-02-15 DIAGNOSIS — I214 Non-ST elevation (NSTEMI) myocardial infarction: Secondary | ICD-10-CM

## 2022-02-15 NOTE — Telephone Encounter (Signed)
Patient reports she forgot about appointment earlier today. Due to other scheduled appointments and having to schedule transportation four days in advance, the earliest pt could reschedule pre-cath appointment is Tuesday February 23, 2022 10:30 AM Ambrose Pancoast, NP in the [facility]. Patient is aware she needs this appointment for H&P, EKG, and BMP/CBC prior to Cardiac Cath 02/25/22.

## 2022-02-15 NOTE — Telephone Encounter (Signed)
Pt was for OV today for EKG and H&P for cardiac cath.  She did not show.  Webb Silversmith, could you reschedule and if she does not want to have that is her choice.  Thanks.  Mickel Baas

## 2022-02-17 NOTE — Progress Notes (Signed)
Office Visit    Patient Name: [name] Date of Encounter: 02/17/2022  Primary Care Provider:  Willaim Sheng Gwen Her, NP Primary Cardiologist:  Carlyle Dolly, MD Primary Electrophysiologist: None  Chief Complaint    [name] is a 66 y.o. female with PMH of COPD, NSTEMI, bipolar disorder, polysubstance abuse, HTN, HLD, DM type II, GERD, HFpEF presents today for precardiac cath visit.  Past Medical History    Past Medical History:  Diagnosis Date   Anemia    Anxiety    Arthritis    Bipolar 1 disorder (HCC)    CHF (congestive heart failure) (HCC)    COPD (chronic obstructive pulmonary disease) ([place])    Depression    Diabetes mellitus without complication (HCC)    Dyspnea    Emphysema of lung (HCC)    GERD (gastroesophageal reflux disease)    Headache    migraines   History of hiatal hernia    History of kidney stones    Hyperlipidemia    Hypertension    Neuromuscular disorder (HCC)    neuropathy   On home oxygen therapy    Pneumonia 2015, 2019   Tracheomalacia    Vaginal Pap smear, abnormal    Past Surgical History:  Procedure Laterality Date   CATARACT EXTRACTION W/PHACO Left 01/14/2020   Procedure: CATARACT EXTRACTION PHACO AND INTRAOCULAR LENS PLACEMENT LEFT EYE;  Surgeon: Baruch Goldmann, MD;  Location: [facility];  Service: Ophthalmology;  Laterality: Left;  CDE: 8.18   CATARACT EXTRACTION W/PHACO Right 02/01/2020   Procedure: CATARACT EXTRACTION PHACO AND INTRAOCULAR LENS PLACEMENT RIGHT EYE;  Surgeon: Baruch Goldmann, MD;  Location: [facility];  Service: Ophthalmology;  Laterality: Right;  CDE: 9.94   CHEILECTOMY Right 01/28/2020   Procedure: KELLER BUNION IMPLANT RIGHT FOOT CHEILECTOMY RIGHT ROOT;  Surgeon: Landis Martins, DPM;  Location: [facility];  Service: Podiatry;  Laterality: Right;  BLOCK   CHOLECYSTECTOMY     COLONOSCOPY  July 2010   Dr. Arnoldo Morale: 3 rectal polyps, not enough tissue for pathologic examination, recommended surveillance in 3  years   COLONOSCOPY N/A 10/23/2014   RMR: Multiple colonic polyps removed as described above. No endoscopic explaniation for abdominal pain. however. next tcs 10/2019   ESOPHAGOGASTRODUODENOSCOPY  July 2010   Dr. Arnoldo Morale: gastritis and duodenitis, H.pylori negative   ESOPHAGOGASTRODUODENOSCOPY N/A 10/23/2014   RMR: Normal EGD. Status post passage of a Maloney dilator. Today's finding s would not explain abdominal pain   EYE SURGERY Left 01/14/2020   cataract removal   GANGLION CYST EXCISION Left 09/07/2018   Procedure: REMOVAL GANGLION OF WRIST;  Surgeon: Carole Civil, MD;  Location: [facility];  Service: Orthopedics;  Laterality: Left;   HERNIA REPAIR     Dr. Arnoldo Morale   KIDNEY STONE SURGERY     MALONEY DILATION N/A 10/23/2014   Procedure: Venia Minks DILATION;  Surgeon: Daneil Dolin, MD;  Location: [facility];  Service: Endoscopy;  Laterality: N/A;   OPEN REDUCTION INTERNAL FIXATION (ORIF) DISTAL RADIAL FRACTURE Right 12/09/2015   Procedure: OPEN REDUCTION INTERNAL FIXATION (ORIF) RIGHT DISTAL RADIUS;  Surgeon: Leanora Cover, MD;  Location: [facility];  Service: Orthopedics;  Laterality: Right;    Allergies  Allergies  Allergen Reactions   Penicillins Other (See Comments)    Patient is unsure if she allergic to penicillin or septra. Patient states one or another caused "rib pain with a little breathing problem". Has patient had a PCN reaction causing immediate rash, facial/tongue/throat swelling, SOB or  lightheadedness with hypotension: YES Has patient had a PCN reaction causing severe rash involving mucus membranes or skin necrosis: NO Has patient had a PCN reaction that required hospitalization: NO Has patient had a PCN reaction occurring within the last 10 years: NO   Septra [Sulfamethoxazole-Trimethoprim] Other (See Comments)    Patient is unsure if she allergic to septra or penicillin. Patient states one or another caused "rib pain with a little breathing problem".     History of Present Illness    [name] is a 66 year old female with the above mentioned PMH who presents today for H&P for possible heart catheterization.  She was initially seen in 2015 during hospitalization for chest pressure.  There was no evidence of ACS by EKG or enzymes.  Patient underwent Lexiscan Myoview that was negative for ischemia but patient had hypertensive response during testing.  She was seen in 2020 by Dr. Harl Bowie for worsening dyspnea with exertion.  2D echo was completed 09/2018 with LVEF 60-65%, grade II diastolic dysfunction, normal RV.  Her Lasix was titrated from 40 mg daily to 80 mg and  had improvement with symptoms when switched to torsemide.  She was seen in follow-up on 04/2021 with complaint of left-sided chest pain and pressure that was described as occurring at rest.  Outpatient Lexiscan Myoview was ordered but not completed.  She was admitted on 01/03/22 with drug overdose from benzodiazepine and cocaine.Troponin elevated 100->2788->2037->2311,ECG non acute J point elevation in inferior alteral leads normal T waves and no loss of R waves CXR with poor inspiration and possible congestion.  She was seen and follow-up on 01/25/2022 and plan was made for coronary catheterization to evaluate reduced EF.  Since last being seen in the office patient reports that she has been doing well with no episodes of chest pain..  She has noticed that her blood pressures are also lower and have been running in the low to mid 932I systolically at home.  Today her blood pressure is 92/58 following taking her medications this morning.  She does endorse expiratory wheezing with minimal exertion that is similar to her baseline with COPD.  We discussed in detail the indications for having left heart catheterization along with risk and benefits.  She is in favor of proceeding at this time and we will complete further work-up for lab work and EKG.  Patient denies chest pain, palpitations,  dyspnea, PND, orthopnea, nausea, vomiting, dizziness, syncope, edema, weight gain, or early satiety.  Home Medications    Current Outpatient Medications  Medication Sig Dispense Refill   acetaminophen (TYLENOL) 500 MG tablet Take 1 tablet (500 mg total) by mouth every 6 (six) hours as needed. 30 tablet 0   albuterol (PROVENTIL) (2.5 MG/3ML) 0.083% nebulizer solution INHALE 1 VIAL VIA NEBULIZER EVERY 4 HOURS (Patient taking differently: Take 2.5 mg by nebulization every 4 (four) hours as needed for wheezing or shortness of breath.) 360 mL 2   albuterol (VENTOLIN HFA) 108 (90 Base) MCG/ACT inhaler INHALE 1 OR 2 PUFFS BY MOUTH EVERY 6 HOURS AS NEEDED FOR WHEEZING OR SHORTNESS OF BREATH 8.5 g 0   aspirin EC 81 MG tablet Take 1 tablet (81 mg total) by mouth daily. Swallow whole. 30 tablet 2   Budeson-Glycopyrrol-Formoterol (BREZTRI AEROSPHERE) 160-9-4.8 MCG/ACT AERO Inhale 2 puffs into the lungs 2 (two) times daily. 10.7 g 1   cetirizine (ZYRTEC) 10 MG tablet Take 10 mg by mouth daily as needed for allergies.      clopidogrel (  PLAVIX) 75 MG tablet Take 1 tablet (75 mg total) by mouth daily. 30 tablet 2   cyclobenzaprine (FLEXERIL) 10 MG tablet Take 10 mg by mouth 3 (three) times daily as needed for muscle spasms. (Patient not taking: Reported on 01/25/2022)     doxycycline (VIBRA-TABS) 100 MG tablet Take 1 tablet (100 mg total) by mouth 2 (two) times daily. 14 tablet 0   FLUoxetine (PROZAC) 20 MG capsule Take 20 mg by mouth daily. (Patient not taking: Reported on 7/61/9509)     folic acid (FOLVITE) 1 MG tablet Take 1 tablet (1 mg total) by mouth daily. 30 tablet 1   gabapentin (NEURONTIN) 800 MG tablet Take 1 tablet (800 mg total) by mouth 3 (three) times daily as needed.     hydrOXYzine (VISTARIL) 50 MG capsule TAKE 1 CAPSULE BY MOUTH THREE TIMES DAILY AS NEEDED FOR ANXIETY (Patient taking differently: Take 50 mg by mouth 3 (three) times daily as needed for anxiety.) 90 capsule 0   ipratropium  (ATROVENT) 0.02 % nebulizer solution Take 2.5 mLs by nebulization every 4 (four) hours as needed for wheezing or shortness of breath.     Ipratropium-Albuterol (COMBIVENT RESPIMAT) 20-100 MCG/ACT AERS respimat 1 puff as needed 4 g 2   meloxicam (MOBIC) 15 MG tablet Take 15 mg by mouth daily.     metFORMIN (GLUCOPHAGE) 1000 MG tablet Take 1,000 mg by mouth 2 (two) times daily.     nitroGLYCERIN (NITROSTAT) 0.4 MG SL tablet Place under the tongue.     olmesartan (BENICAR) 20 MG tablet Take 1 tablet (20 mg total) by mouth daily. 30 tablet 1   olmesartan-hydrochlorothiazide (BENICAR HCT) 20-12.5 MG tablet Take 1 tablet by mouth daily.     omeprazole (PRILOSEC) 20 MG capsule Take 20 mg by mouth daily.     ondansetron (ZOFRAN-ODT) 4 MG disintegrating tablet Take 4 mg by mouth 2 (two) times daily as needed for nausea/vomiting.     predniSONE (DELTASONE) 5 MG tablet Take 3 tabs daily for 1 week then 2 tabs daily for 1 week then 1 tab daily until seen in office 60 tablet 1   promethazine (PHENERGAN) 25 MG tablet Take 25 mg by mouth 3 (three) times daily as needed for nausea/vomiting.     rOPINIRole (REQUIP) 1 MG tablet Take 1 mg by mouth 3 (three) times daily.      rosuvastatin (CRESTOR) 10 MG tablet Take 1 tablet (10 mg total) by mouth daily. 90 tablet 3   SUMAtriptan (IMITREX) 25 MG tablet Take 25 mg by mouth every 2 (two) hours as needed for migraine.     thiamine 100 MG tablet Take 1 tablet (100 mg total) by mouth daily. 30 tablet 1   torsemide (DEMADEX) 20 MG tablet Take 1 tablet (20 mg total) by mouth daily as needed. May take an additional 20 mg daily as needed for extreme swelling. 30 tablet 0   No current facility-administered medications for this visit.     Review of Systems  Please see the history of present illness.    (+) Expiratory wheezing with minimal exertion (+) Fatigue  All other systems reviewed and are otherwise negative except as noted above.  Physical Exam    Wt Readings  from Last 3 Encounters:  01/25/22 202 lb 6.4 oz (91.8 kg)  01/12/22 206 lb 9.6 oz (93.7 kg)  01/02/22 224 lb 13.9 oz (102 kg)   TO:IZTIW were no vitals filed for this visit.,There is no height or weight on file  to calculate BMI.  Constitutional:      Appearance: Healthy appearance. Not in distress.  Neck:     Vascular: JVD normal.  Pulmonary:     Effort: Pulmonary effort is normal.     Breath sounds: Expiratory wheezing. No rales. Diminished in the bases Cardiovascular:     Normal rate. Regular rhythm. Normal S1. Normal S2.      Murmurs: There is no murmur.  Edema:    Peripheral edema absent.  Abdominal:     Palpations: Abdomen is soft non tender. There is no hepatomegaly.  Skin:    General: Skin is warm and dry.  Neurological:     General: No focal deficit present.     Mental Status: Alert and oriented to person, place and time.     Cranial Nerves: Cranial nerves are intact.  EKG/LABS/Other Studies Reviewed    ECG personally reviewed by me today -sinus rhythm with rate of 89 with no acute changes since previous EKG   Lab Results  Component Value Date   WBC 18.6 (H) 01/06/2022   HGB 10.8 (L) 01/06/2022   HCT 32.8 (L) 01/06/2022   MCV 95.9 01/06/2022   PLT 243 01/06/2022   Lab Results  Component Value Date   CREATININE 0.59 01/06/2022   BUN 20 01/06/2022   NA 139 01/06/2022   K 3.8 01/06/2022   CL 94 (L) 01/06/2022   CO2 32 01/06/2022   Lab Results  Component Value Date   ALT 27 01/02/2022   AST 32 01/02/2022   ALKPHOS 75 01/02/2022   BILITOT 0.3 01/02/2022   Lab Results  Component Value Date   CHOL 197 07/18/2015   HDL 41 07/18/2015   LDLCALC 127 (H) 07/18/2015   TRIG 144 07/18/2015   CHOLHDL 4.8 (H) 07/18/2015    Lab Results  Component Value Date   HGBA1C 6.5 (H) 01/02/2022    Assessment & Plan    1.  Coronary artery disease: -Echo with EF 45% in the setting of active cocaine use DDX includes coronary vasospasm vs plaque rupture event; at high  risk for both due to cocaine and tobacco use -Patient scheduled for left heart cath with Dr. Claiborne Billings on 02/25/2022 and indications with the risk and benefits discussed at detail and she would like to proceed at this time. -Continue GDMT with ASA 81 mg, Plavix 75 mg, Crestor 10 mg, and Benicar 20-12.5 mg -Patient not on beta-blockers due to history of cocaine abuse  2.  Hypertension: -Blood pressure today was 92/58 -Continue Benicar as noted above  3.  Ischemic cardiomyopathy: -EF of 40-45% on most recent 2D echo with regional wall motion abnormalities and dilated LVH. -Blood pressure today was 92/58 -Patient scheduled for LHC for definitive evaluation of LV function  4.  Polysubstance abuse: -History of cocaine and tobacco abuse -Patient continuing to smoke and denies any current cocaine use  5.  Diabetes type 2: -Patient's last hemoglobin A1c was 6.5  Disposition: Follow-up with Carlyle Dolly, MD or [facility] in 2 weeks Shared Decision Making/Informed Consent The risks [stroke (1 in 1000), death (1 in 40), kidney failure [usually temporary] (1 in 500), bleeding (1 in 200), allergic reaction [possibly serious] (1 in 200)], benefits (diagnostic support and management of coronary artery disease) and alternatives of a cardiac catheterization were discussed in detail with [name] and she is willing to proceed.   Medication Adjustments/Labs and Tests Ordered: Current medicines are reviewed at length with the patient today.  Concerns regarding medicines are  outlined above.   Signed, Mable Fill, Marissa Nestle, NP 02/17/2022, 12:26 PM [place]

## 2022-02-18 DIAGNOSIS — I209 Angina pectoris, unspecified: Secondary | ICD-10-CM | POA: Insufficient documentation

## 2022-02-18 NOTE — Addendum Note (Signed)
Addended by: Katrine Coho on: 02/18/2022 03:27 PM   Modules accepted: Orders

## 2022-02-19 ENCOUNTER — Encounter: Payer: Self-pay | Admitting: Adult Health

## 2022-02-19 ENCOUNTER — Ambulatory Visit (INDEPENDENT_AMBULATORY_CARE_PROVIDER_SITE_OTHER): Payer: Medicare Other | Admitting: Adult Health

## 2022-02-19 DIAGNOSIS — J9611 Chronic respiratory failure with hypoxia: Secondary | ICD-10-CM

## 2022-02-19 DIAGNOSIS — F17210 Nicotine dependence, cigarettes, uncomplicated: Secondary | ICD-10-CM | POA: Diagnosis not present

## 2022-02-19 DIAGNOSIS — J441 Chronic obstructive pulmonary disease with (acute) exacerbation: Secondary | ICD-10-CM | POA: Diagnosis not present

## 2022-02-19 DIAGNOSIS — I5032 Chronic diastolic (congestive) heart failure: Secondary | ICD-10-CM | POA: Diagnosis not present

## 2022-02-19 DIAGNOSIS — I5030 Unspecified diastolic (congestive) heart failure: Secondary | ICD-10-CM | POA: Insufficient documentation

## 2022-02-19 DIAGNOSIS — B370 Candidal stomatitis: Secondary | ICD-10-CM

## 2022-02-19 MED ORDER — PREDNISONE 5 MG PO TABS: 5.0000 mg | ORAL_TABLET | Freq: Every day | ORAL | 2 refills | Status: DC

## 2022-02-19 MED ORDER — CLOTRIMAZOLE 10 MG MT TROC: 10.0000 mg | Freq: Every day | OROMUCOSAL | 0 refills | Status: DC

## 2022-02-19 NOTE — Progress Notes (Signed)
@Patient  ID: [name], female    DOB: [birth year], 66 y.o.   MRN: [id number]  Chief Complaint  Patient presents with   Follow-up    Referring provider: Hyler, Gwen Her, NP  HPI: 66 year old female active smoker followed for severe COPD and oxygen dependent respiratory failure with baseline oxygen at 4 to 5 L/min.  Patient has been on oxygen since 2018 Medical history significant for bipolar disorder and polysubstance abuse  TEST/EVENTS :  PFTs July 29, 2020 FEV1 50%, ratio 77, FVC 49%, no significant bronchodilator response, DLCO 50%    02/19/2022 Follow up ; COPD , O2 RF  Patient presents for 1 month follow-up.  Patient is followed for severe COPD and oxygen dependent respiratory failure.  At baseline she is on 4 to 5 .  Patient says that her breathing has been doing better since last visit.  Feels that she is getting a little bit stronger.  Says that she can set at times off of oxygen and O2 saturations remained above 90%.  Patient was hospitalized last month for acute mental status changes secondary to substance abuse.  She had excessive somnolence secondary to alcohol and cocaine abuse at home.  She did require BiPAP support during hospital stay.  She is also was treated with diuresis for decompensated congestive heart failure.  2D echo did show a decreased EF at 45%.  Was felt to have some demand ischemia from cocaine use.  She is following up with cardiology and has an outpatient cardiac cath scheduled.  She was treated for a COPD exacerbation with a Z-Pak and slow Pred taper.  She was tapered down to 5 mg daily.  Patient says that she seems to be doing well. Patient does have a history of depression and bipolar disorder.  She also has polysubstance abuse history.  She is following with psychiatry. Patient says she is feeling better.  Has not used since her discharge.  Encouraged on cessation.  Patient does continue to smoke.  We discussed smoking cessation in detail.  She  remains on Breztri inhaler twice daily. She denies any chest pain orthopnea PND or increased leg swelling Chest x-ray did show a 2.4 cm nodular opacity in the left apex.  She was set up for CT chest.  Unfortunately patient canceled her appointment 3 separate times.  Long discussion with her regarding need to follow-up for CT.    Allergies  Allergen Reactions   Penicillins Other (See Comments)    Patient is unsure if she allergic to penicillin or septra. Patient states one or another caused "rib pain with a little breathing problem". Has patient had a PCN reaction causing immediate rash, facial/tongue/throat swelling, SOB or lightheadedness with hypotension: YES Has patient had a PCN reaction causing severe rash involving mucus membranes or skin necrosis: NO Has patient had a PCN reaction that required hospitalization: NO Has patient had a PCN reaction occurring within the last 10 years: NO   Septra [Sulfamethoxazole-Trimethoprim] Other (See Comments)    Patient is unsure if she allergic to septra or penicillin. Patient states one or another caused "rib pain with a little breathing problem".    Immunization History  Administered Date(s) Administered   Influenza Whole 03/29/2019   Influenza-Unspecified 03/06/2019, 04/13/2021   Moderna Sars-Covid-2 Vaccination 02/22/2020, 04/18/2020   Pneumococcal Polysaccharide-23 07/06/2014   Tdap 11/30/2013    Past Medical History:  Diagnosis Date   Anemia    Anxiety    Arthritis    Bipolar 1 disorder ([place])  CHF (congestive heart failure) (HCC)    COPD (chronic obstructive pulmonary disease) (HCC)    Depression    Diabetes mellitus without complication (HCC)    Dyspnea    Emphysema of lung (HCC)    GERD (gastroesophageal reflux disease)    Headache    migraines   History of hiatal hernia    History of kidney stones    Hyperlipidemia    Hypertension    Neuromuscular disorder (HCC)    neuropathy   On home oxygen therapy    Pneumonia  2015, 2019   Tracheomalacia    Vaginal Pap smear, abnormal     Tobacco History: Social History   Tobacco Use  Smoking Status Every Day   Packs/day: 2.00   Years: 52.00   Total pack years: 104.00   Types: Cigarettes   Start date: 1969  Smokeless Tobacco Never  Tobacco Comments   Smoking a pack in 24 hours.  02/19/22 hfb   Ready to quit: No Counseling given: Yes Tobacco comments: Smoking a pack in 24 hours.  02/19/22 hfb   Outpatient Medications Prior to Visit  Medication Sig Dispense Refill   acetaminophen (TYLENOL) 500 MG tablet Take 1 tablet (500 mg total) by mouth every 6 (six) hours as needed. 30 tablet 0   albuterol (PROVENTIL) (2.5 MG/3ML) 0.083% nebulizer solution INHALE 1 VIAL VIA NEBULIZER EVERY 4 HOURS (Patient taking differently: Take 2.5 mg by nebulization every 4 (four) hours as needed for wheezing or shortness of breath.) 360 mL 2   albuterol (VENTOLIN HFA) 108 (90 Base) MCG/ACT inhaler INHALE 1 OR 2 PUFFS BY MOUTH EVERY 6 HOURS AS NEEDED FOR WHEEZING OR SHORTNESS OF BREATH 8.5 g 0   aspirin EC 81 MG tablet Take 1 tablet (81 mg total) by mouth daily. Swallow whole. 30 tablet 2   Budeson-Glycopyrrol-Formoterol (BREZTRI AEROSPHERE) 160-9-4.8 MCG/ACT AERO Inhale 2 puffs into the lungs 2 (two) times daily. 10.7 g 1   cetirizine (ZYRTEC) 10 MG tablet Take 10 mg by mouth daily as needed for allergies.      clopidogrel (PLAVIX) 75 MG tablet Take 1 tablet (75 mg total) by mouth daily. 30 tablet 2   gabapentin (NEURONTIN) 800 MG tablet Take 1 tablet (800 mg total) by mouth 3 (three) times daily as needed.     hydrOXYzine (VISTARIL) 50 MG capsule TAKE 1 CAPSULE BY MOUTH THREE TIMES DAILY AS NEEDED FOR ANXIETY (Patient taking differently: Take 50 mg by mouth 3 (three) times daily as needed for anxiety.) 90 capsule 0   ipratropium (ATROVENT) 0.02 % nebulizer solution Take 2.5 mLs by nebulization every 4 (four) hours as needed for wheezing or shortness of breath.      Ipratropium-Albuterol (COMBIVENT RESPIMAT) 20-100 MCG/ACT AERS respimat 1 puff as needed 4 g 2   meloxicam (MOBIC) 15 MG tablet Take 15 mg by mouth daily.     metFORMIN (GLUCOPHAGE) 1000 MG tablet Take 1,000 mg by mouth 2 (two) times daily.     nitroGLYCERIN (NITROSTAT) 0.4 MG SL tablet Place under the tongue.     olmesartan (BENICAR) 20 MG tablet Take 1 tablet (20 mg total) by mouth daily. 30 tablet 1   olmesartan-hydrochlorothiazide (BENICAR HCT) 20-12.5 MG tablet Take 1 tablet by mouth daily.     omeprazole (PRILOSEC) 20 MG capsule Take 20 mg by mouth daily.     promethazine (PHENERGAN) 25 MG tablet Take 25 mg by mouth 3 (three) times daily as needed for nausea/vomiting.     rOPINIRole (REQUIP)  1 MG tablet Take 1 mg by mouth 3 (three) times daily.      rosuvastatin (CRESTOR) 10 MG tablet Take 1 tablet (10 mg total) by mouth daily. 90 tablet 3   SUMAtriptan (IMITREX) 25 MG tablet Take 25 mg by mouth every 2 (two) hours as needed for migraine.     thiamine 100 MG tablet Take 1 tablet (100 mg total) by mouth daily. 30 tablet 1   torsemide (DEMADEX) 20 MG tablet Take 1 tablet (20 mg total) by mouth daily as needed. May take an additional 20 mg daily as needed for extreme swelling. 30 tablet 0   cyclobenzaprine (FLEXERIL) 10 MG tablet Take 10 mg by mouth 3 (three) times daily as needed for muscle spasms. (Patient not taking: Reported on 01/25/2022)     doxycycline (VIBRA-TABS) 100 MG tablet Take 1 tablet (100 mg total) by mouth 2 (two) times daily. (Patient not taking: Reported on 02/19/2022) 14 tablet 0   FLUoxetine (PROZAC) 20 MG capsule Take 20 mg by mouth daily. (Patient not taking: Reported on 7/56/4332)     folic acid (FOLVITE) 1 MG tablet Take 1 tablet (1 mg total) by mouth daily. (Patient not taking: Reported on 02/19/2022) 30 tablet 1   ondansetron (ZOFRAN-ODT) 4 MG disintegrating tablet Take 4 mg by mouth 2 (two) times daily as needed for nausea/vomiting. (Patient not taking: Reported on  02/19/2022)     predniSONE (DELTASONE) 5 MG tablet Take 3 tabs daily for 1 week then 2 tabs daily for 1 week then 1 tab daily until seen in office (Patient not taking: Reported on 02/19/2022) 60 tablet 1   No facility-administered medications prior to visit.     Review of Systems:   Constitutional:   No  weight loss, night sweats,  Fevers, chills,  +fatigue, or  lassitude.  HEENT:   No headaches,  Difficulty swallowing,  Tooth/dental problems, or  Sore throat,                No sneezing, itching, ear ache, nasal congestion, post nasal drip,   CV:  No chest pain,  Orthopnea, PND, swelling in lower extremities, anasarca, dizziness, palpitations, syncope.   GI  No heartburn, indigestion, abdominal pain, nausea, vomiting, diarrhea, change in bowel habits, loss of appetite, bloody stools.   Resp:   No chest wall deformity  Skin: no rash or lesions.  GU: no dysuria, change in color of urine, no urgency or frequency.  No flank pain, no hematuria   MS:  No joint pain or swelling.  No decreased range of motion.  No back pain.    Physical Exam  BP (!) 102/58 (BP Location: Left Arm, Patient Position: Sitting, Cuff Size: Large)   Pulse 88   Temp (!) 97.1 F (36.2 C) (Oral)   Ht 5\' 4"  (1.626 m)   Wt 206 lb (93.4 kg)   SpO2 90%   BMI 35.36 kg/m   GEN: A/Ox3; pleasant , NAD, well nourished , off O2 .    HEENT:  [place]/AT,  NOSE-clear, THROAT- no lesions, no postnasal drip or exudate noted.  Scattered white patches consistent with candidiasis  NECK:  Supple w/ fair ROM; no JVD; normal carotid impulses w/o bruits; no thyromegaly or nodules palpated; no lymphadenopathy.    RESP  few trace rhonchi  no accessory muscle use, no dullness to percussion  CARD:  RRR, no m/r/g, no peripheral edema, pulses intact, no cyanosis or clubbing.  GI:   Soft & nt; nml bowel  sounds; no organomegaly or masses detected.   Musco: Warm bil, no deformities or joint swelling noted.   Neuro: alert, no focal  deficits noted.    Skin: Warm, no lesions or rashes    Lab Results:      BNP   Imaging: No results found.       Latest Ref Rng & Units 07/29/2020    9:06 AM 07/14/2016    9:05 AM 12/06/2013   10:00 AM  PFT Results  FVC-Pre L 1.48  1.48    FVC-Predicted Pre % 46  45  52      FVC-Post L 1.58   1.93      FVC-Predicted Post % 49   57      Pre FEV1/FVC % % 81  87  82      Post FEV1/FCV % % 77   82      FEV1-Pre L 1.19  1.29  1.46      FEV1-Predicted Pre % 49  51  55      FEV1-Post L 1.22   1.58      DLCO uncorrected ml/min/mmHg 10.04   14.71      DLCO UNC% % 50   60      DLCO corrected ml/min/mmHg 10.23   14.75      DLCO COR %Predicted % 51   60      DLVA Predicted % 82   100      TLC L 4.94   3.73      TLC % Predicted % 97   73      RV % Predicted % 158   99         This result is from an external source.    No results found for: "NITRICOXIDE"      Assessment & Plan:   COPD exacerbation ([place]) Recent COPD exacerbation now appears to be improving and back to baseline.  We will continue on current regimen and low-dose prednisone.  On return visit if still doing well.  We will try to wean off of prednisone slowly.  Plan  Patient Instructions  Set up CT chest with contrast. [facility] ) -please reschedule  Mucinex DM Twice daily  As needed  cough/congestion  Tessalon Three times a day  As needed  cough .  Continue on Prednisone 5mg  daily.  Continue on Breztri 2 puffs twice daily, rinse after use Albuterol inhaler or nebulizer as needed Continue on oxygen 4 to 5 L to maintain O2 saturations greater than 88 to 90% Look at the support group for addiction.  Continue with counseling .  Mycelex troche five times a day for 1 week.  Follow up in 4 week with Kameela Leipold NP or Dr Melvyn Novas .  Please contact office for sooner follow up if symptoms do not improve or worsen or seek emergency care      Chronic hypoxemic respiratory failure ([place]) Continue on oxygen to  maintain O2 saturations greater than 88 to 90%.  Cigarette smoker Encouraged on smoking cessation  Diastolic CHF ([place]) Appears euvolemic on exam.  Continue follow-up with cardiology  Oral candidiasis Oral candidiasis.  Patient is advised on inhaler oral care.  We will treat with Mycelex x7 days.     Rexene Edison, NP 02/19/2022

## 2022-02-19 NOTE — Assessment & Plan Note (Signed)
Appears euvolemic on exam.  Continue follow-up with cardiology 

## 2022-02-19 NOTE — Assessment & Plan Note (Signed)
Recent COPD exacerbation now appears to be improving and back to baseline.  We will continue on current regimen and low-dose prednisone.  On return visit if still doing well.  We will try to wean off of prednisone slowly.  Plan  Patient Instructions  Set up CT chest with contrast. [facility] ) -please reschedule  Mucinex DM Twice daily  As needed  cough/congestion  Tessalon Three times a day  As needed  cough .  Continue on Prednisone 5mg  daily.  Continue on Breztri 2 puffs twice daily, rinse after use Albuterol inhaler or nebulizer as needed Continue on oxygen 4 to 5 L to maintain O2 saturations greater than 88 to 90% Look at the support group for addiction.  Continue with counseling .  Mycelex troche five times a day for 1 week.  Follow up in 4 week with Wetzel Meester NP or Dr Melvyn Novas .  Please contact office for sooner follow up if symptoms do not improve or worsen or seek emergency care

## 2022-02-19 NOTE — Assessment & Plan Note (Signed)
Encouraged on smoking cessation

## 2022-02-19 NOTE — Assessment & Plan Note (Signed)
Oral candidiasis.  Patient is advised on inhaler oral care.  We will treat with Mycelex x7 days.

## 2022-02-19 NOTE — Assessment & Plan Note (Signed)
Continue on oxygen to maintain O2 saturations greater than 88 to 90%. 

## 2022-02-19 NOTE — Patient Instructions (Addendum)
Set up CT chest with contrast. [facility] ) -please reschedule  Mucinex DM Twice daily  As needed  cough/congestion  Tessalon Three times a day  As needed  cough .  Continue on Prednisone 5mg  daily.  Continue on Breztri 2 puffs twice daily, rinse after use Albuterol inhaler or nebulizer as needed Continue on oxygen 4 to 5 L to maintain O2 saturations greater than 88 to 90% Look at the support group for addiction.  Continue with counseling .  Mycelex troche five times a day for 1 week.  Follow up in 4 week with Malaisha Silliman NP or Dr Melvyn Novas .  Please contact office for sooner follow up if symptoms do not improve or worsen or seek emergency care

## 2022-02-22 ENCOUNTER — Telehealth: Payer: Self-pay | Admitting: Cardiovascular Disease

## 2022-02-22 NOTE — Telephone Encounter (Signed)
Pt c/o medication issue:  1. Name of Medication:  Plavix  2. How are you currently taking this medication (dosage and times per day)?   3. Are you having a reaction (difficulty breathing--STAT)?   4. What is your medication issue?   Patient would like to know if she needs to hold Plavix prior to 8/24 procedure with Dr. Claiborne Billings. Please advise.

## 2022-02-23 ENCOUNTER — Encounter: Payer: Self-pay | Admitting: Nurse Practitioner

## 2022-02-23 ENCOUNTER — Ambulatory Visit (INDEPENDENT_AMBULATORY_CARE_PROVIDER_SITE_OTHER): Payer: Medicare Other | Admitting: Nurse Practitioner

## 2022-02-23 ENCOUNTER — Other Ambulatory Visit: Payer: Medicare Other

## 2022-02-23 VITALS — BP 92/58 | HR 96 | Ht 64.0 in | Wt 204.0 lb

## 2022-02-23 DIAGNOSIS — I214 Non-ST elevation (NSTEMI) myocardial infarction: Secondary | ICD-10-CM

## 2022-02-23 DIAGNOSIS — I255 Ischemic cardiomyopathy: Secondary | ICD-10-CM | POA: Diagnosis not present

## 2022-02-23 DIAGNOSIS — I10 Essential (primary) hypertension: Secondary | ICD-10-CM

## 2022-02-23 DIAGNOSIS — Z01812 Encounter for preprocedural laboratory examination: Secondary | ICD-10-CM

## 2022-02-23 DIAGNOSIS — E1122 Type 2 diabetes mellitus with diabetic chronic kidney disease: Secondary | ICD-10-CM

## 2022-02-23 DIAGNOSIS — N181 Chronic kidney disease, stage 1: Secondary | ICD-10-CM

## 2022-02-23 DIAGNOSIS — F1910 Other psychoactive substance abuse, uncomplicated: Secondary | ICD-10-CM

## 2022-02-23 DIAGNOSIS — I25118 Atherosclerotic heart disease of native coronary artery with other forms of angina pectoris: Secondary | ICD-10-CM | POA: Diagnosis not present

## 2022-02-23 LAB — CBC
Hematocrit: 32.9 % — ABNORMAL LOW (ref 34.0–46.6)
Hemoglobin: 10.9 g/dL — ABNORMAL LOW (ref 11.1–15.9)
MCH: 31.3 pg (ref 26.6–33.0)
MCHC: 33.1 g/dL (ref 31.5–35.7)
MCV: 95 fL (ref 79–97)
Platelets: 258 10*3/uL (ref 150–450)
RBC: 3.48 x10E6/uL — ABNORMAL LOW (ref 3.77–5.28)
RDW: 13.9 % (ref 11.7–15.4)
WBC: 11.7 10*3/uL — ABNORMAL HIGH (ref 3.4–10.8)

## 2022-02-23 LAB — BASIC METABOLIC PANEL
BUN/Creatinine Ratio: 18 (ref 12–28)
BUN: 12 mg/dL (ref 8–27)
CO2: 34 mmol/L — ABNORMAL HIGH (ref 20–29)
Calcium: 11.5 mg/dL — ABNORMAL HIGH (ref 8.7–10.3)
Chloride: 94 mmol/L — ABNORMAL LOW (ref 96–106)
Creatinine, Ser: 0.68 mg/dL (ref 0.57–1.00)
Glucose: 152 mg/dL — ABNORMAL HIGH (ref 70–99)
Potassium: 4.5 mmol/L (ref 3.5–5.2)
Sodium: 134 mmol/L (ref 134–144)
eGFR: 96 mL/min/{1.73_m2} (ref >59)

## 2022-02-23 NOTE — Patient Instructions (Addendum)
Medication Instructions:  Your physician recommends that you continue on your current medications as directed. Please refer to the Current Medication list given to you today.  *If you need a refill on your cardiac medications before your next appointment, please call your pharmacy*   Lab Work: Bmp, Cbc- today   If you have labs (blood work) drawn today and your tests are completely normal, you will receive your results only by: [place] (if you have MyChart) OR A paper copy in the mail If you have any lab test that is abnormal or we need to change your treatment, we will call you to review the results.   Testing/Procedures: Your physician has requested that you have a cardiac catheterization. Cardiac catheterization is used to diagnose and/or treat various heart conditions. Doctors may recommend this procedure for a number of different reasons. The most common reason is to evaluate chest pain. Chest pain can be a symptom of coronary artery disease (CAD), and cardiac catheterization can show whether plaque is narrowing or blocking your heart's arteries. This procedure is also used to evaluate the valves, as well as measure the blood flow and oxygen levels in different parts of your heart. For further information please visit [email or web address]. Please follow instruction sheet, as given.    Follow-Up: Follow up as scheduled    Other Instructions  [facility] OFFICE [place], [place] [address] Dept: [phone] Loc: [place]  02/23/2022  You are scheduled for a Cardiac Catheterization on Thursday, August 24 with Dr. Shelva Majestic.  1. Please arrive at the Main Entrance A at [facility]: [place], [place] [address] at 5:30 AM (This time is two hours before your procedure to ensure your preparation). Free valet parking service is  available.   Special note: Every effort is made to have your procedure done on time. Please understand that emergencies sometimes delay scheduled procedures.  2. Diet: Do not eat solid foods after midnight.  You may have clear liquids until 5 AM upon the day of the procedure.  4. Medication instructions in preparation for your procedure:  Hold Torsemide the morning of your procedure   Hold Olmesartan-Hctz the morning of your procedure   Do not take Diabetes Med Glucophage (Metformin) on the day of the procedure and HOLD 48 HOURS AFTER THE PROCEDURE.  On the morning of your procedure, take Plavix/Clopidogrel and any morning medicines NOT listed above.  You may use sips of water.  5. Plan to go home the same day, you will only stay overnight if medically necessary. 6. You MUST have a responsible adult to drive you home. 7. An adult MUST be with you the first 24 hours after you arrive home. 8. Bring a current list of your medications, and the last time and date medication taken. 9. Bring ID and current insurance cards. 10.Please wear clothes that are easy to get on and off and wear slip-on shoes.  Thank you for allowing [place] to care for you   -- [facility]

## 2022-02-23 NOTE — Telephone Encounter (Signed)
Pt to continue Plavix prior to appointment.

## 2022-02-23 NOTE — Telephone Encounter (Signed)
Left message for patient that she does not stop plavix before heart cath.

## 2022-02-24 ENCOUNTER — Telehealth: Payer: Self-pay | Admitting: *Deleted

## 2022-02-24 NOTE — Telephone Encounter (Signed)
Cardiac Catheterization scheduled at [facility] for: Thursday February 25, 2022 7:30 AM Arrival time and place: [place] Entrance A at: 5:30 AM   Nothing to eat after midnight prior to procedure, clear liquids until 5 AM day of procedure.  Medication instructions: -Hold:  Metformin-day of procedure and 48 hours post procedure  Olmesartan/HCT-AM of procedure  Torsemide-AM of procedure   -Except hold medications usual morning medications can be taken with sips of water including aspirin 81 mg and Plavix 75 mg  Confirmed patient has responsible adult to drive home post procedure and be with patient first 24 hours after arriving home.  Patient reports no new symptoms concerning for COVID-19 in the past 10 days.  Reviewed procedure instructions with patient.   Patient reports she was prescribed Cefdinir 02/18/22 bid x 10 days for sore on left lower leg, she reports sore much better, almost healed completely, no drainage/redness.

## 2022-02-25 ENCOUNTER — Ambulatory Visit (HOSPITAL_COMMUNITY): Admission: RE | Admit: 2022-02-25 | Payer: Medicare Other | Source: Home / Self Care | Admitting: Cardiovascular Disease

## 2022-02-25 ENCOUNTER — Encounter (HOSPITAL_COMMUNITY): Admission: RE | Payer: Medicare Other | Source: Home / Self Care

## 2022-02-25 ENCOUNTER — Telehealth: Payer: Self-pay | Admitting: *Deleted

## 2022-02-25 SURGERY — LEFT HEART CATH AND CORONARY ANGIOGRAPHY
Anesthesia: LOCAL

## 2022-02-25 NOTE — Telephone Encounter (Signed)
Patient was no show for cardiac cath this morning. Call placed to patient to discuss rescheduling cath-voicemail full at both numbers listed for patient, unable to leave a message at either number for patient to call back.

## 2022-02-25 NOTE — Telephone Encounter (Signed)
Attempted to contact pt to r/s [facility]. Pt's mailbox full-unable to leave v.mail

## 2022-02-25 NOTE — Telephone Encounter (Signed)
Called pt to discuss r/s cath w/no answer. Called spouse and was informed that he was not home at the time, but will give a message to pt to call us inregard to r/s appt.

## 2022-02-26 NOTE — Telephone Encounter (Signed)
Spoke to pt who verbalized agreement with r/s [facility].   March 03, 2022 with Dr. Ellyn Hack @ 11:30 am. Pt to arrive at 9:30 am.   Unable to reach pt by phone- will try again later.

## 2022-02-26 NOTE — Telephone Encounter (Signed)
Attempted to contact pt to r/s [facility]. Pt's mailbox full-unable to leave v.mail

## 2022-03-02 ENCOUNTER — Telehealth: Payer: Self-pay | Admitting: *Deleted

## 2022-03-02 NOTE — Telephone Encounter (Signed)
Call placed to patient to review procedure instructions, no answer mobile number listed, voicemail now full, unable to leave a message. No answer at alternate number listed.

## 2022-03-02 NOTE — Telephone Encounter (Signed)
Cardiac Catheterization scheduled at [facility] for: Wednesday March 03, 2022 11:30 AM Arrival time and place: [place] Entrance A at: 9:30 AM    Nothing to eat after midnight prior to procedure, clear liquids until 5 AM day of procedure.  Medication instructions: -Hold:  Torsemide/Olmesartan/HCT-AM of procedure  Metformin-day of procedure and 48 hours post procedure -Except hold medications usual morning medications can be taken with sips of water including aspirin 81 mg and Plavix 75 mg.  Confirmed patient has responsible adult to drive home post procedure and be with patient first 24 hours after arriving home.  Patient reports no new symptoms concerning for COVID-19 in the past 10 days.  Left message for patient to call back to review procedure instructions.

## 2022-03-02 NOTE — Telephone Encounter (Signed)
Call placed to patient to review procedure instructions, mailbox full, unable to leave message.

## 2022-03-03 NOTE — Telephone Encounter (Signed)
Noted  

## 2022-03-03 NOTE — Telephone Encounter (Signed)
See Jennifer's Progress Note from earlier today-she has been rescheduled to 03/10/22.

## 2022-03-03 NOTE — Progress Notes (Signed)
Patient hasn't shown for procedure.  Called her to see if she was coming.  She states not going to be able to come today.  Rescheduled her for next Wednesday.  She is going to arrange her transportation.

## 2022-03-04 ENCOUNTER — Telehealth: Payer: Self-pay | Admitting: Adult Health

## 2022-03-04 ENCOUNTER — Telehealth: Payer: Self-pay | Admitting: Cardiology

## 2022-03-04 NOTE — Telephone Encounter (Signed)
Dr. Harl Bowie,  pt had NSTEMI with acute on chronic respiratory failure and acute systolic CHF.  She has hx of COPD with home 02, HTN, polysubstance abuse, GERD, DM-2, Bipolar disorder.  It was thought to have outpt cath once she improved.  She has coronary calcification on CTA of chest.  When I saw she had some chest pain.  She was scheduled for cath - with one reschedule, then she did not show for cath due to transportation issue - rescheduled for yesterday and did not show, could not come.  We did reschedule for next week.  How long should we continue to do this?  thanks

## 2022-03-04 NOTE — Progress Notes (Deleted)
Office Visit    Patient Name: JOEI FRANGOS Date of Encounter: 03/04/2022  Primary Care Provider:  Willaim Sheng Gwen Her, NP (Inactive) Primary Cardiologist:  Carlyle Dolly, MD Primary Electrophysiologist: None  Chief Complaint    YARIELA TISON is a 66 y.o. female with PMH of COPD, NSTEMI, bipolar disorder, polysubstance abuse, HTN, HLD, DM type II, GERD, HFpEF presents today for follow-up of LHC.  Past Medical History    Past Medical History:  Diagnosis Date   Anemia    Anxiety    Arthritis    Bipolar 1 disorder (HCC)    CHF (congestive heart failure) (HCC)    COPD (chronic obstructive pulmonary disease) ([place])    Depression    Diabetes mellitus without complication (HCC)    Dyspnea    Emphysema of lung (HCC)    GERD (gastroesophageal reflux disease)    Headache    migraines   History of hiatal hernia    History of kidney stones    Hyperlipidemia    Hypertension    Neuromuscular disorder (HCC)    neuropathy   On home oxygen therapy    Pneumonia 2015, 2019   Tracheomalacia    Vaginal Pap smear, abnormal    Past Surgical History:  Procedure Laterality Date   CATARACT EXTRACTION W/PHACO Left 01/14/2020   Procedure: CATARACT EXTRACTION PHACO AND INTRAOCULAR LENS PLACEMENT LEFT EYE;  Surgeon: Baruch Goldmann, MD;  Location: [facility];  Service: Ophthalmology;  Laterality: Left;  CDE: 8.18   CATARACT EXTRACTION W/PHACO Right 02/01/2020   Procedure: CATARACT EXTRACTION PHACO AND INTRAOCULAR LENS PLACEMENT RIGHT EYE;  Surgeon: Baruch Goldmann, MD;  Location: [facility];  Service: Ophthalmology;  Laterality: Right;  CDE: 9.94   CHEILECTOMY Right 01/28/2020   Procedure: KELLER BUNION IMPLANT RIGHT FOOT CHEILECTOMY RIGHT ROOT;  Surgeon: Landis Martins, DPM;  Location: [facility];  Service: Podiatry;  Laterality: Right;  BLOCK   CHOLECYSTECTOMY     COLONOSCOPY  July 2010   Dr. Arnoldo Morale: 3 rectal polyps, not enough tissue for pathologic examination, recommended surveillance in  3 years   COLONOSCOPY N/A 10/23/2014   RMR: Multiple colonic polyps removed as described above. No endoscopic explaniation for abdominal pain. however. next tcs 10/2019   ESOPHAGOGASTRODUODENOSCOPY  July 2010   Dr. Arnoldo Morale: gastritis and duodenitis, H.pylori negative   ESOPHAGOGASTRODUODENOSCOPY N/A 10/23/2014   RMR: Normal EGD. Status post passage of a Maloney dilator. Today's finding s would not explain abdominal pain   EYE SURGERY Left 01/14/2020   cataract removal   GANGLION CYST EXCISION Left 09/07/2018   Procedure: REMOVAL GANGLION OF WRIST;  Surgeon: Carole Civil, MD;  Location: [facility];  Service: Orthopedics;  Laterality: Left;   HERNIA REPAIR     Dr. Arnoldo Morale   KIDNEY STONE SURGERY     MALONEY DILATION N/A 10/23/2014   Procedure: Venia Minks DILATION;  Surgeon: Daneil Dolin, MD;  [facility]: [facility];  Service: Endoscopy;  Laterality: N/A;   OPEN REDUCTION INTERNAL FIXATION (ORIF) DISTAL RADIAL FRACTURE Right 12/09/2015   Procedure: OPEN REDUCTION INTERNAL FIXATION (ORIF) RIGHT DISTAL RADIUS;  Surgeon: Leanora Cover, MD;  Location: [facility];  Service: Orthopedics;  Laterality: Right;    Allergies  Allergies  Allergen Reactions   Penicillins Other (See Comments)    Patient is unsure if she allergic to penicillin or septra. Patient states one or another caused "rib pain with a little breathing problem". Has patient had a PCN reaction causing immediate rash, facial/tongue/throat swelling, SOB  or lightheadedness with hypotension: YES Has patient had a PCN reaction causing severe rash involving mucus membranes or skin necrosis: NO Has patient had a PCN reaction that required hospitalization: NO Has patient had a PCN reaction occurring within the last 10 years: NO   Septra [Sulfamethoxazole-Trimethoprim] Other (See Comments)    Patient is unsure if she allergic to septra or penicillin. Patient states one or another caused "rib pain with a little breathing problem".     History of Present Illness    SUEANNE MANIACI is a 66 year old female with the above mentioned PMH who presents today for H&P for possible heart catheterization.  She was initially seen in 2015 during hospitalization for chest pressure.  There was no evidence of ACS by EKG or enzymes.  Patient underwent Lexiscan Myoview that was negative for ischemia but patient had hypertensive response during testing.  She was seen in 2020 by Dr. Harl Bowie for worsening dyspnea with exertion.  2D echo was completed 09/2018 with LVEF 60-65%, grade II diastolic dysfunction, normal RV.  Her Lasix was titrated from 40 mg daily to 80 mg and  had improvement with symptoms when switched to torsemide.  She was seen in follow-up on 04/2021 with complaint of left-sided chest pain and pressure that was described as occurring at rest.  Outpatient Lexiscan Myoview was ordered but not completed.   She was admitted on 01/03/22 with drug overdose from benzodiazepine and cocaine.Troponin elevated 100->2788->2037->2311,ECG non acute J point elevation in inferior alteral leads normal T waves and no loss of R waves CXR with poor inspiration and possible congestion.  She was seen and follow-up on 01/25/2022 and plan was made for coronary catheterization to evaluate reduced EF.  Patient was seen on 8/22 for precath visit and was in agreement with proceeding with scheduled catheterization.    Since last being seen in the office patient reports***.  Patient denies chest pain, palpitations, dyspnea, PND, orthopnea, nausea, vomiting, dizziness, syncope, edema, weight gain, or early satiety.     ***Notes: -Cardiac cath was rescheduled due to no-show on 8/30  Home Medications    Current Outpatient Medications  Medication Sig Dispense Refill   acetaminophen (TYLENOL) 500 MG tablet Take 1 tablet (500 mg total) by mouth every 6 (six) hours as needed. (Patient taking differently: Take 1,000 mg by mouth every 6 (six) hours as needed.) 30  tablet 0   albuterol (PROVENTIL) (2.5 MG/3ML) 0.083% nebulizer solution INHALE 1 VIAL VIA NEBULIZER EVERY 4 HOURS (Patient not taking: Reported on 02/26/2022) 360 mL 2   albuterol (VENTOLIN HFA) 108 (90 Base) MCG/ACT inhaler INHALE 1 OR 2 PUFFS BY MOUTH EVERY 6 HOURS AS NEEDED FOR WHEEZING OR SHORTNESS OF BREATH 8.5 g 0   aspirin EC 81 MG tablet Take 1 tablet (81 mg total) by mouth daily. Swallow whole. 30 tablet 2   Budeson-Glycopyrrol-Formoterol (BREZTRI AEROSPHERE) 160-9-4.8 MCG/ACT AERO Inhale 2 puffs into the lungs 2 (two) times daily. 10.7 g 1   cetirizine (ZYRTEC) 10 MG tablet Take 10 mg by mouth daily as needed for allergies.      clopidogrel (PLAVIX) 75 MG tablet Take 1 tablet (75 mg total) by mouth daily. 30 tablet 2   clotrimazole (MYCELEX) 10 MG troche Take 1 tablet (10 mg total) by mouth 5 (five) times daily. 35 Troche 0   cycloSPORINE (RESTASIS) 0.05 % ophthalmic emulsion Place 1 drop into both eyes 2 (two) times daily as needed (dry eyes).     DULoxetine (CYMBALTA) 20 MG  capsule Take 20 mg by mouth daily.     folic acid (FOLVITE) 1 MG tablet Take 1 tablet (1 mg total) by mouth daily. 30 tablet 1   gabapentin (NEURONTIN) 800 MG tablet Take 1 tablet (800 mg total) by mouth 3 (three) times daily as needed.     hydrOXYzine (VISTARIL) 50 MG capsule TAKE 1 CAPSULE BY MOUTH THREE TIMES DAILY AS NEEDED FOR ANXIETY (Patient taking differently: Take 50 mg by mouth 3 (three) times daily as needed for anxiety.) 90 capsule 0   ipratropium (ATROVENT) 0.02 % nebulizer solution Take 2.5 mLs by nebulization every 4 (four) hours as needed for wheezing or shortness of breath.     Ipratropium-Albuterol (COMBIVENT RESPIMAT) 20-100 MCG/ACT AERS respimat 1 puff as needed 4 g 2   meloxicam (MOBIC) 15 MG tablet Take 15 mg by mouth daily as needed for pain.     metFORMIN (GLUCOPHAGE) 1000 MG tablet Take 1,000 mg by mouth 2 (two) times daily.     nitroGLYCERIN (NITROSTAT) 0.4 MG SL tablet Place under the tongue.      olmesartan-hydrochlorothiazide (BENICAR HCT) 20-12.5 MG tablet Take 1 tablet by mouth daily.     ondansetron (ZOFRAN-ODT) 4 MG disintegrating tablet Take 4 mg by mouth 2 (two) times daily as needed for nausea/vomiting.     pantoprazole (PROTONIX) 40 MG tablet Take 40 mg by mouth daily.     predniSONE (DELTASONE) 5 MG tablet Take 1 tablet (5 mg total) by mouth daily with breakfast. 30 tablet 2   rOPINIRole (REQUIP) 1 MG tablet Take 1 mg by mouth 3 (three) times daily.      rosuvastatin (CRESTOR) 10 MG tablet Take 1 tablet (10 mg total) by mouth daily. 90 tablet 3   SUMAtriptan (IMITREX) 25 MG tablet Take 25 mg by mouth every 2 (two) hours as needed for migraine.     thiamine 100 MG tablet Take 1 tablet (100 mg total) by mouth daily. 30 tablet 1   torsemide (DEMADEX) 20 MG tablet Take 1 tablet (20 mg total) by mouth daily as needed. May take an additional 20 mg daily as needed for extreme swelling. (Patient not taking: Reported on 02/26/2022) 30 tablet 0   No current facility-administered medications for this visit.     Review of Systems  Please see the history of present illness.    (+)*** (+)***  All other systems reviewed and are otherwise negative except as noted above.  Physical Exam    Wt Readings from Last 3 Encounters:  02/23/22 204 lb (92.5 kg)  02/19/22 206 lb (93.4 kg)  01/25/22 202 lb 6.4 oz (91.8 kg)   VQ:QVZDG were no vitals filed for this visit.,There is no height or weight on file to calculate BMI.  Constitutional:      Appearance: Healthy appearance. Not in distress.  Neck:     Vascular: JVD normal.  Pulmonary:     Effort: Pulmonary effort is normal.     Breath sounds: No wheezing. No rales. Diminished in the bases Cardiovascular:     Normal rate. Regular rhythm. Normal S1. Normal S2.      Murmurs: There is no murmur.  Edema:    Peripheral edema absent.  Abdominal:     Palpations: Abdomen is soft non tender. There is no hepatomegaly.  Skin:    General:  Skin is warm and dry.  Neurological:     General: No focal deficit present.     Mental Status: Alert and oriented to person, place  and time.     Cranial Nerves: Cranial nerves are intact.  EKG/LABS/Other Studies Reviewed    ECG personally reviewed by me today - ***  Risk Assessment/Calculations:   {Does this patient have ATRIAL FIBRILLATION?[phone]}        Lab Results  Component Value Date   WBC 11.7 (H) 02/23/2022   HGB 10.9 (L) 02/23/2022   HCT 32.9 (L) 02/23/2022   MCV 95 02/23/2022   PLT 258 02/23/2022   Lab Results  Component Value Date   CREATININE 0.68 02/23/2022   BUN 12 02/23/2022   NA 134 02/23/2022   K 4.5 02/23/2022   CL 94 (L) 02/23/2022   CO2 34 (H) 02/23/2022   Lab Results  Component Value Date   ALT 27 01/02/2022   AST 32 01/02/2022   ALKPHOS 75 01/02/2022   BILITOT 0.3 01/02/2022   Lab Results  Component Value Date   CHOL 197 07/18/2015   HDL 41 07/18/2015   LDLCALC 127 (H) 07/18/2015   TRIG 144 07/18/2015   CHOLHDL 4.8 (H) 07/18/2015    Lab Results  Component Value Date   HGBA1C 6.5 (H) 01/02/2022    Assessment & Plan   1.  Coronary artery disease: -Echo with EF 45% in the setting of active cocaine use DDX includes coronary vasospasm vs plaque rupture event; at high risk for both due to cocaine and tobacco use -Patient scheduled for left heart cath with Dr. Claiborne Billings on 02/25/2022 and indications with the risk and benefits discussed at detail and she would like to proceed at this time. -Continue GDMT with ASA 81 mg, Plavix 75 mg, Crestor 10 mg, and Benicar 20-12.5 mg -Patient not on beta-blockers due to history of cocaine abuse   2.  Hypertension: -Blood pressure today was 92/58 -Continue Benicar as noted above   3.  Ischemic cardiomyopathy: -EF of 40-45% on most recent 2D echo with regional wall motion abnormalities and dilated LVH. -Blood pressure today was 92/58 -Patient scheduled for LHC for definitive evaluation of LV function    4.  Polysubstance abuse: -History of cocaine and tobacco abuse -Patient continuing to smoke and denies any current cocaine use   5.  Diabetes type 2: -Patient's last hemoglobin A1c was 6.5       Disposition: Follow-up with Carlyle Dolly, MD or APP in *** months {Are you ordering a CV Procedure (e.g. stress test, cath, DCCV, TEE, etc)?   Press F2        :[id number]}   Medication Adjustments/Labs and Tests Ordered: Current medicines are reviewed at length with the patient today.  Concerns regarding medicines are outlined above.   Signed, Mable Fill, Marissa Nestle, NP 03/04/2022, 10:39 AM [facility]

## 2022-03-04 NOTE — Telephone Encounter (Signed)
Spoke with the pt  She is c/o increased SOB, wheezing, chest congestion and cough with yellow sputum x 2 days  She just finished round of omnicef for cellulitis  She states no fevers, aches, sore throat, HA  She is taking her breztri, preg 5 mg daily, albuterol inhaler and nebs  She is asking for abx  Not appts available until next wk  Please advise, thanks!  Allergies  Allergen Reactions   Penicillins Other (See Comments)    Patient is unsure if she allergic to penicillin or septra. Patient states one or another caused "rib pain with a little breathing problem". Has patient had a PCN reaction causing immediate rash, facial/tongue/throat swelling, SOB or lightheadedness with hypotension: YES Has patient had a PCN reaction causing severe rash involving mucus membranes or skin necrosis: NO Has patient had a PCN reaction that required hospitalization: NO Has patient had a PCN reaction occurring within the last 10 years: NO   Septra [Sulfamethoxazole-Trimethoprim] Other (See Comments)    Patient is unsure if she allergic to septra or penicillin. Patient states one or another caused "rib pain with a little breathing problem".

## 2022-03-04 NOTE — Telephone Encounter (Signed)
Lmtcb for pt.  

## 2022-03-04 NOTE — Telephone Encounter (Signed)
Omnicef should have covered lungs just fine if has stopped all smoking and taking mucinex dm 1200 mg bid or the equivalent  Rec  Prednisone 10 mg take  4 each am x 2 days,   2 each am x 2 days,  1 each am x 2 days   Ok to add to office in [place] for Friday 9/11 PM (will be opening)  if no appts sooner but must bring all meds with her

## 2022-03-05 NOTE — Telephone Encounter (Signed)
LMTCB

## 2022-03-09 ENCOUNTER — Ambulatory Visit: Payer: Medicare Other | Admitting: Nurse Practitioner

## 2022-03-09 ENCOUNTER — Telehealth: Payer: Medicare Other | Admitting: Pulmonary Disease

## 2022-03-09 ENCOUNTER — Telehealth: Payer: Self-pay | Admitting: *Deleted

## 2022-03-09 NOTE — Telephone Encounter (Signed)
Cardiac Catheterization scheduled at [facility] for: Wednesday March 10, 2022 11:30 AM Arrival time and place: [facility] Entrance A at: 9:30 AM  Nothing to eat after midnight prior to procedure, clear liquids until 5 AM day of procedure.  Medication instructions: -Hold:  Metformin-day of procedure and 48 hours post procedure   Olmesartan/HCT-AM of procedure  -Except hold medications usual morning medications can be taken with sips of water including aspirin 81 mg and Plavix 75 mg  Confirmed patient has responsible adult to drive home post procedure and be with patient first 24 hours after arriving home.  Patient reports no new symptoms concerning for COVID-19 in the past 10 days.  Reviewed procedure instructions with patient.  Patient reports reason she did not have cath 03/03/22 is that transportation did not show.  Patient reports she has made transportation arrangements through her insurance for 03/10/22 arrive 9:30 AM [facility].

## 2022-03-09 NOTE — Telephone Encounter (Signed)
Patient did not want to come to [facility]. Office visit scheduled for 9/12 at 930am with Dr Melvyn Novas in [place]

## 2022-03-09 NOTE — Telephone Encounter (Signed)
She is not able to complete the visit. She wants someone to call her to make a follow up on another time.

## 2022-03-09 NOTE — Telephone Encounter (Signed)
Spoke with the pt  Her phone has not been working  She is requesting abx Video visit scheduled since she says unable to come in

## 2022-03-09 NOTE — Telephone Encounter (Addendum)
Patient reports she does have some cough/wheezing this morning that is not unusual for her, uses nebulizer and inhalers with relief. Patient reports no new respiratory symptoms, afebrile, feels ready to have cardiac cath done 03/10/22.

## 2022-03-09 NOTE — Telephone Encounter (Signed)
If fails to make the latest rescheduled cath would not reschedule and just do an outpatient f/u 4 months reassess  Zandra Abts MD

## 2022-03-10 ENCOUNTER — Telehealth: Payer: Self-pay | Admitting: Cardiology

## 2022-03-10 ENCOUNTER — Ambulatory Visit (HOSPITAL_COMMUNITY): Admission: RE | Admit: 2022-03-10 | Payer: Medicare Other | Source: Home / Self Care | Admitting: Cardiology

## 2022-03-10 ENCOUNTER — Encounter (HOSPITAL_COMMUNITY): Admission: RE | Payer: Self-pay | Source: Home / Self Care

## 2022-03-10 SURGERY — LEFT HEART CATH AND CORONARY ANGIOGRAPHY
Anesthesia: LOCAL

## 2022-03-10 NOTE — Telephone Encounter (Signed)
Noted, will advise ED for CP

## 2022-03-10 NOTE — Telephone Encounter (Signed)
Patient notified that at this time we will not r/s HC. Pt advised to go to ED for cp per provider. Pt verbalized understanding.

## 2022-03-10 NOTE — Telephone Encounter (Signed)
Pt called to reschedule Cardiac Cath. Advised someone will call her back.

## 2022-03-10 NOTE — Telephone Encounter (Signed)
Pt has been unable to present for cardiac cath several times.  I discussed with Dr. Harl Bowie and will not reschedule at this time.  If pt develops chest pain she should go to ER.  Otherwise we will schedule for OV in 4 months.    Triage can you let pt know. Thank you.

## 2022-03-16 ENCOUNTER — Ambulatory Visit: Payer: Medicare Other | Admitting: Internal Medicine

## 2022-03-16 NOTE — Progress Notes (Deleted)
[name], female    DOB: 1956-06-14   MRN: [id number]   Brief patient profile:  81 yowf active smoker  referred to pulmonary clinic in [place]  05/04/2021 by Dr  Shearon Stalls   Previous patient of Dr. Luan Pulling for COPD. Has been on oxygen since 2018. years. 4-5LNC.  FEV1 51% of predicted 1.46L in 2018. She turns up oxygen when she gets short of breath.  Last Shaune Spittle 11/05/2020 Rec pulmonary rehab   History of Present Illness  05/04/2021  Pulmonary/ 1st office eval/ Janos Shampine / [place] [facility] re GOLD 2 copd  Chief Complaint  Patient presents with   Follow-up    4-4.5L O2 cont. SOB and cough have worsened because of congestion. Coughing up green mucus.   On room air pt was at 89%-90% after walk form lobby to room. 94% on 4L O2 cont.after sitting in room.   Prev. Seen dr. Shearon Stalls but [name] office is closer.   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  Cough: some am congestion Sleep: sometimes cough noct bed flat / 2 pillows  SABA use: multiple forms 02 4lpm / pulse 02 4  Has overt hb despite reporting ppi bid  Rec Plan A = Automatic = Always=    stiolto 2 puffs 1st thing each am  Work on inhaler technique:  Plan B = Backup (to supplement plan A, not to replace it) Only use your albuterol inhaler (proair)as a rescue medication Plan C = Crisis (instead of Plan B but only if Plan B stops working) - only use your albuterol nebulizer if you first try Plan B and it fails to help > ok to use the nebulizer up to every 4 hours but if start needing it regularly call for immediate appointment Stop lisinopril and call me today if you are not 30 mg daily of lisinopril  Benicar (olmesartan) 20 mg daily in place of lisinopril Prednisone 10 mg take  4 each am x 2 days,   2 each am x 2 days,  1 each am x 2 days and stop  The key is to stop smoking completely before smoking completely stops you! Make sure you check your oxygen saturation  at your highest level of  activity  to be sure it stays over 90% and adjust  02 flow upward to maintain this level if needed but remember to turn it back to previous settings when you stop (to conserve your supply).     08/04/2021  f/u ov/ office/[name] re:  COPD 3/ 02 dep maint on stiolto  Chief Complaint  Patient presents with   Follow-up    Feels no improvement in breathing since last OV.   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[employer] but not checking 02 / does try walking some outside 100 ft MB Cough: smoker's rattle / yellowish esp in am Sleeping: bed is flat with 2 pillow  SABA use: once already hfa and neb 4 x daily plus combivent  02: 4 lpm /  POC 4  Covid status: vax x 3  Lung cancer screening: due f/u Rec We will be referring you to Eric Form NP for the lung cancer screening  Depomedrol 120 mg IM  Zpak  Plan A = Automatic = Always=    Stiolto 2 pffs each  Work on inhaler technique:  relax and gently blow all the way out then take a nice smooth full deep breath back in, triggering the inhaler at  same time you start reathing in.  Hold for up to 5 seconds if you can.     Plan B = Backup (to supplement plan A, not to replace it) Only use your albuterol inhaler as a rescue medication  Plan C = Crisis (instead of Plan B but only if Plan B stops working) - only use your albuterol nebulizer if you first try Plan B and it fails to help The key is to stop smoking completely before smoking completely stops you! Please schedule a follow up office visit in 2 weeks, sooner if needed  with all medications /inhalers/ solutions in hand    09/17/2021  f/u ov/[name] re: copd 3/02 dep    maint on ??? confused with meds did not bring them as rec  Chief Complaint  Patient presents with   Follow-up    Pt still has wheezing and congestion     Dyspnea:  little better with exertion  Cough: smoker's rattle  Sleeping: flat bed 2 pillows no resp cc SABA use: 3-4 inhaler, not needing  02: 4lpm hs  and up to 5lpm  daytime but not titrating by sats  Rec Plan A = Automatic = Always=    Breztri Take 2 puffs first thing in am and then another 2 puffs about 12 hours later.  Work on inhaler technique:  - remember how golfers take practice swings  Plan B = Backup (to supplement plan A, not to replace it) Only use your albuterol inhaler as a rescue medication  Plan C = Crisis (instead of Plan B but only if Plan B stops working) - only use your albuterol nebulizer if you first try Plan B  Prednisone 10 mg take  4 each am x 2 days,   2 each am x 2 days,  1 each am x 2 days and stop  Make sure you check your oxygen saturation  AT  your highest level of activity (not after you stop)    The key is to stop smoking completely before smoking completely stops you!  Please schedule a follow up office visit in 6 weeks, call sooner if needed with all medications /inhalers/ solutions in hand     03/16/2022  f/u ov/[place] office/[name] re: *** maint on ***  CT planned for 03/22/22  No chief complaint on file.   Dyspnea:  *** Cough: *** Sleeping: *** SABA use: *** 02: *** Covid status: *** Lung cancer screening: ***   No obvious day to day or daytime variability or assoc excess/ purulent sputum or mucus plugs or hemoptysis or cp or chest tightness, subjective wheeze or overt sinus or hb symptoms.   *** without nocturnal  or early am exacerbation  of respiratory  c/o's or need for noct saba. Also denies any obvious fluctuation of symptoms with weather or environmental changes or other aggravating or alleviating factors except as outlined above   No unusual exposure hx or h/o childhood pna/ asthma or knowledge of premature birth.  Current Allergies, Complete Past Medical History, Past Surgical History, Family History, and Social History were reviewed in [employer] record.  ROS  The following are not active complaints unless bolded Hoarseness, sore throat, dysphagia, dental problems,  itching, sneezing,  nasal congestion or discharge of excess mucus or purulent secretions, ear ache,   fever, chills, sweats, unintended wt loss or wt gain, classically pleuritic or exertional cp,  orthopnea pnd or arm/hand swelling  or leg swelling, presyncope, palpitations, abdominal pain, anorexia, nausea, vomiting,  diarrhea  or change in bowel habits or change in bladder habits, change in stools or change in urine, dysuria, hematuria,  rash, arthralgias, visual complaints, headache, numbness, weakness or ataxia or problems with walking or coordination,  change in mood or  memory.        No outpatient medications have been marked as taking for the 03/16/22 encounter (Appointment) with Rebekah Rockers, MD.                              Past Medical History:  Diagnosis Date   Anemia    Anxiety    Arthritis    Bipolar 1 disorder ([place])    CHF (congestive heart failure) ([place])    COPD (chronic obstructive pulmonary disease) ([place])    Depression    Diabetes mellitus without complication (HCC)    Dyspnea    Emphysema of lung (HCC)    GERD (gastroesophageal reflux disease)    Headache    migraines   History of hiatal hernia    History of kidney stones    Hyperlipidemia    Hypertension    Neuromuscular disorder (HCC)    neuropathy   On home oxygen therapy    Pneumonia 2015, 2019   Tracheomalacia    Vaginal Pap smear, abnormal         Objective:     03/16/2022        ***  09/17/2021       224   08/04/21 230 lb 1.3 oz (104.4 kg)  05/04/21 226 lb 1.9 oz (102.6 kg)  04/17/21 223 lb 6.4 oz (101.3 kg)      Vital signs reviewed  03/16/2022  - Note at rest 02 sats  ***% on ***   General appearance:    ***      Mild bar***                Assessment

## 2022-03-22 ENCOUNTER — Ambulatory Visit (HOSPITAL_COMMUNITY)
Admission: RE | Admit: 2022-03-22 | Discharge: 2022-03-22 | Disposition: A | Discharge status: Home / Self Care | Payer: Medicare Other | Source: Ambulatory Visit | Attending: Adult Health | Admitting: Adult Health

## 2022-03-22 DIAGNOSIS — R911 Solitary pulmonary nodule: Secondary | ICD-10-CM | POA: Insufficient documentation

## 2022-03-22 DIAGNOSIS — J449 Chronic obstructive pulmonary disease, unspecified: Secondary | ICD-10-CM | POA: Diagnosis not present

## 2022-03-22 MED ORDER — IOHEXOL 300 MG/ML  SOLN
75.0000 mL | Freq: Once | INTRAMUSCULAR | Status: AC | PRN
  Administered 2022-03-22: 75 mL via INTRAVENOUS

## 2022-03-23 ENCOUNTER — Telehealth: Payer: Self-pay | Admitting: Adult Health

## 2022-03-23 ENCOUNTER — Ambulatory Visit: Payer: Medicare Other | Admitting: Adult Health

## 2022-03-23 NOTE — Telephone Encounter (Signed)
CT chest shows increased size and lung nodule in the left upper lobe.,  Left breast abnormality and chronic interstitial changes questionable NSIP Patient needs office visit to discuss these results and next step with further imaging and evaluation  Please refer to primary care provider for breast abnormality and will most likely need diagnostic mammogram and ultrasound  Patient was scheduled for an office visit today March 23, 2022 and missed her visit please call and have her reschedule.  She is a patient of Dr. Melvyn Novas

## 2022-03-23 NOTE — Telephone Encounter (Signed)
Received a call report on patient's CT scan from yesterday. Below is a copy of the impression:   IMPRESSION: 1. Cavitary solid 2.7 cm peripheral apical left upper lobe pulmonary nodule, significantly increased in size since 09/18/2019 chest CT, highly suspicious for primary bronchogenic carcinoma. Multidisciplinary thoracic oncology consultation and PET-CT suggested for further evaluation at this time. 2. No thoracic adenopathy. 3. Focal soft tissue asymmetry in the upper outer left breast, not appreciably changed by CT. Please ensure patient is up-to-date with mammographic screening. 4. Diffuse patchy peribronchovascular ground-glass opacity and reticulation in both lungs, similar to prior. An indolent interstitial lung disease such as NSIP or chronic hypersensitivity pneumonitis is not excluded. 5. Three-vessel coronary atherosclerosis. 6. Aortic Atherosclerosis (ICD10-I70.0).

## 2022-03-23 NOTE — Telephone Encounter (Signed)
Called patient but she did not answer. Left message for her to call back.  

## 2022-03-24 NOTE — Progress Notes (Signed)
Called and spoke with patient, she is scheduled for an OV on 03/30/22 at 10:30 am, advised to arrive by 10:15 am for check in.  She verbalized understanding.  Nothing further needed.

## 2022-03-24 NOTE — Telephone Encounter (Signed)
Called patient and went over results of CT with her. Got her scheduled to see TP Monday at 11:30. Nothing further needed

## 2022-03-29 ENCOUNTER — Ambulatory Visit: Payer: Medicare Other | Admitting: Adult Health

## 2022-03-30 ENCOUNTER — Other Ambulatory Visit: Payer: Self-pay | Admitting: Internal Medicine

## 2022-03-30 ENCOUNTER — Other Ambulatory Visit (HOSPITAL_COMMUNITY): Payer: Self-pay | Admitting: Adult Health

## 2022-03-30 ENCOUNTER — Encounter: Payer: Self-pay | Admitting: Adult Health

## 2022-03-30 ENCOUNTER — Ambulatory Visit (INDEPENDENT_AMBULATORY_CARE_PROVIDER_SITE_OTHER): Payer: Medicare Other | Admitting: Adult Health

## 2022-03-30 VITALS — BP 110/60 | HR 88 | Temp 97.9°F | Ht 64.0 in | Wt 204.2 lb

## 2022-03-30 DIAGNOSIS — F17210 Nicotine dependence, cigarettes, uncomplicated: Secondary | ICD-10-CM | POA: Diagnosis not present

## 2022-03-30 DIAGNOSIS — J441 Chronic obstructive pulmonary disease with (acute) exacerbation: Secondary | ICD-10-CM | POA: Diagnosis not present

## 2022-03-30 DIAGNOSIS — N6320 Unspecified lump in the left breast, unspecified quadrant: Secondary | ICD-10-CM | POA: Diagnosis not present

## 2022-03-30 DIAGNOSIS — R911 Solitary pulmonary nodule: Secondary | ICD-10-CM | POA: Diagnosis not present

## 2022-03-30 DIAGNOSIS — J84115 Respiratory bronchiolitis interstitial lung disease: Secondary | ICD-10-CM

## 2022-03-30 DIAGNOSIS — F1910 Other psychoactive substance abuse, uncomplicated: Secondary | ICD-10-CM

## 2022-03-30 DIAGNOSIS — N181 Chronic kidney disease, stage 1: Secondary | ICD-10-CM

## 2022-03-30 DIAGNOSIS — E1122 Type 2 diabetes mellitus with diabetic chronic kidney disease: Secondary | ICD-10-CM

## 2022-03-30 DIAGNOSIS — N63 Unspecified lump in unspecified breast: Secondary | ICD-10-CM | POA: Insufficient documentation

## 2022-03-30 LAB — CBC WITH DIFFERENTIAL/PLATELET
Basophils Absolute: 0 10*3/uL (ref 0.0–0.1)
Basophils Relative: 0.1 % (ref 0.0–3.0)
Eosinophils Absolute: 0.1 10*3/uL (ref 0.0–0.7)
Eosinophils Relative: 0.4 % (ref 0.0–5.0)
HCT: 34.8 % — ABNORMAL LOW (ref 36.0–46.0)
Hemoglobin: 11.5 g/dL — ABNORMAL LOW (ref 12.0–15.0)
Lymphocytes Relative: 16.6 % (ref 12.0–46.0)
Lymphs Abs: 2.7 10*3/uL (ref 0.7–4.0)
MCHC: 33 g/dL (ref 30.0–36.0)
MCV: 95.8 fl (ref 78.0–100.0)
Monocytes Absolute: 0.9 10*3/uL (ref 0.1–1.0)
Monocytes Relative: 5.7 % (ref 3.0–12.0)
Neutro Abs: 12.7 10*3/uL — ABNORMAL HIGH (ref 1.4–7.7)
Neutrophils Relative %: 77.2 % — ABNORMAL HIGH (ref 43.0–77.0)
Platelets: 308 10*3/uL (ref 150.0–400.0)
RBC: 3.63 Mil/uL — ABNORMAL LOW (ref 3.87–5.11)
RDW: 13.7 % (ref 11.5–15.5)
WBC: 16.4 10*3/uL — ABNORMAL HIGH (ref 4.0–10.5)

## 2022-03-30 LAB — BASIC METABOLIC PANEL
BUN: 16 mg/dL (ref 6–23)
CO2: 36 mEq/L — ABNORMAL HIGH (ref 19–32)
Calcium: 11.4 mg/dL — ABNORMAL HIGH (ref 8.4–10.5)
Chloride: 89 mEq/L — ABNORMAL LOW (ref 96–112)
Creatinine, Ser: 0.82 mg/dL (ref 0.40–1.20)
GFR: 74.31 mL/min (ref >60.00)
Glucose, Bld: 199 mg/dL — ABNORMAL HIGH (ref 70–99)
Potassium: 4.3 mEq/L (ref 3.5–5.1)
Sodium: 131 mEq/L — ABNORMAL LOW (ref 135–145)

## 2022-03-30 LAB — SEDIMENTATION RATE: Sed Rate: 36 mm/hr — ABNORMAL HIGH (ref 0–30)

## 2022-03-30 MED ORDER — LEVOFLOXACIN 500 MG PO TABS: 500.0000 mg | ORAL_TABLET | Freq: Every day | ORAL | 0 refills | Status: AC

## 2022-03-30 MED ORDER — PREDNISONE 10 MG PO TABS: 10.0000 mg | ORAL_TABLET | Freq: Every day | ORAL | 0 refills | Status: DC

## 2022-03-30 NOTE — Assessment & Plan Note (Signed)
Recurrent COPD exacerbation and active smoker.  Patient smoking cessation was encouraged in detail. We will treat with a 7-day course of Levaquin and prednisone burst 10 mg dail for 1 week and then 5 mg daily Triple therapy with Breztri  Plan   Patient Instructions  Levaquin 500mg  daily 7 days  Mucinex DM Twice daily  As needed  cough/congestion  Tessalon Three times a day  As needed  cough .  Increase Prednisone 10 mg daily for 1 week , then 5mg  daily  Continue on Breztri 2 puffs twice daily, rinse after use Albuterol inhaler or nebulizer as needed Continue on oxygen 4 to 5 L to maintain O2 saturations greater than 88 to 90% Look at the support group for addiction.  Continue with counseling .  Set up PET scan [facility])  Set up Diagnostic Mammogram for abnormal area on breast Follow up with cardiology next month.  Follow up with Primary provider regarding abnormal  area on breast, and Diabetes  Follow up in 2 months with Deylan Canterbury NP or [name] .  Please contact office for sooner follow up if symptoms do not improve or worsen or seek emergency care

## 2022-03-30 NOTE — Assessment & Plan Note (Signed)
Continue with counseling.

## 2022-03-30 NOTE — Assessment & Plan Note (Signed)
Abnormal breast soft tissue asymmetry on CT scan.  Will need a diagnostic mammogram.- Advised to follow-up with primary care provider going forward.  We will set up for diagnostic mammogram.   Plan  Patient Instructions  Levaquin 500mg  daily 7 days  Mucinex DM Twice daily  As needed  cough/congestion  Tessalon Three times a day  As needed  cough .  Increase Prednisone 10 mg daily for 1 week , then 5mg  daily  Continue on Breztri 2 puffs twice daily, rinse after use Albuterol inhaler or nebulizer as needed Continue on oxygen 4 to 5 L to maintain O2 saturations greater than 88 to 90% Look at the support group for addiction.  Continue with counseling .  Set up PET scan [facility])  Set up Diagnostic Mammogram for abnormal area on breast Follow up with cardiology next month.  Follow up with Primary provider regarding abnormal  area on breast, and Diabetes  Follow up in 2 months with Welles Walthall NP or [name] .  Please contact office for sooner follow up if symptoms do not improve or worsen or seek emergency care

## 2022-03-30 NOTE — Assessment & Plan Note (Signed)
Smoking cessation discussed in detail 

## 2022-03-30 NOTE — Patient Instructions (Addendum)
Levaquin 500mg  daily 7 days  Mucinex DM Twice daily  As needed  cough/congestion  Tessalon Three times a day  As needed  cough .  Increase Prednisone 10 mg daily for 1 week , then 5mg  daily  Continue on Breztri 2 puffs twice daily, rinse after use Albuterol inhaler or nebulizer as needed Continue on oxygen 4 to 5 L to maintain O2 saturations greater than 88 to 90% Look at the support group for addiction.  Continue with counseling .  Set up PET scan [facility])  Set up Diagnostic Mammogram for abnormal area on breast Follow up with cardiology next month.  Follow up with Primary provider regarding abnormal  area on breast, and Diabetes  Follow up in 2 months with Earl Zellmer NP or [name] .  Please contact office for sooner follow up if symptoms do not improve or worsen or seek emergency care

## 2022-03-30 NOTE — Assessment & Plan Note (Signed)
Abnormal CT chest with a 2.4 cavitary solid apical left upper lobe nodule concerning for underlying malignancy.  We will set PET scan up.  Once PET is returned consider possible a CT-guided biopsy for tissue diagnosis if PET positive  Plan Patient Instructions  Levaquin 500mg  daily 7 days  Mucinex DM Twice daily  As needed  cough/congestion  Tessalon Three times a day  As needed  cough .  Increase Prednisone 10 mg daily for 1 week , then 5mg  daily  Continue on Breztri 2 puffs twice daily, rinse after use Albuterol inhaler or nebulizer as needed Continue on oxygen 4 to 5 L to maintain O2 saturations greater than 88 to 90% Look at the support group for addiction.  Continue with counseling .  Set up PET scan [facility])  Set up Diagnostic Mammogram for abnormal area on breast Follow up with cardiology next month.  Follow up with Primary provider regarding abnormal  area on breast, and Diabetes  Follow up in 2 months with Kellina Dreese NP or [name] .  Please contact office for sooner follow up if symptoms do not improve or worsen or seek emergency care

## 2022-03-30 NOTE — Progress Notes (Signed)
@Patient  ID: Joesph Fillers, female    DOB: [birth year], 66 y.o.   MRN: [id number]  Chief Complaint  Patient presents with   Follow-up    Follow up.     Referring provider: No ref. provider found  HPI: 66 year old female active smoker followed for severe COPD and oxygen dependent respiratory failure with baseline oxygen at 4 to 5 L/min.  Patient is on oxygen since 2019 Medical history significant for bipolar disorder and polysubstance abuse   TEST/EVENTS :  PFTs July 29, 2020 FEV1 50%, ratio 77, FVC 49%, no significant bronchodilator response, DLCO 50%    03/30/2022 Follow up : COPD and O2 RF  Patient returns for a 6-week follow-up.  She is followed for severe COPD with oxygen dependent respiratory failure.  She is on Oxygen 4-5 L/m  She was admitted earlier this summer with acute mental status changes secondary to substance abuse.  She had excessive somnolence secondary to alcohol and cocaine abuse.  She required BiPAP support during hospital stay.  She also had decompensated congestive heart failure and required diuresis.  2D echo showed decreased EF at 45%.  Felt to have demand ischemia from cocaine use.  She was treated for a COPD exacerbation.  Chest x-ray showed a 2.4 cm nodular opacity in the left apex.  Subsequent CT chest was done on March 22, 2022 that showed a cavitary solid 2.7 x 1.9 cm apical left upper lobe nodule abutting the peripheral pleura significantly increased from March 2021.  Diffuse groundglass opacity and reticulation in both lungs concerning for NSIP versus chronic hypersensitivity pneumonitis.  no adenopathy.,  Also a focal soft tissue asymmetry in the left breast.  Remains on Breztri 2 puffs twice daily.  She is on prednisone 5 mg daily Since last visit patient she continues to have ongoing cough, congestion and wheezing.  Patient does continue to smoke.  We discussed smoking cessation.  We reviewed her CT scan in detail.  Discussed that she will  need an upcoming PET scan.  Also a diagnostic mammogram last mammogram was greater than 1 year ago.  Has not noticed any palpable breast abnormalities.   Allergies  Allergen Reactions   Penicillins Other (See Comments)    Patient is unsure if she allergic to penicillin or septra. Patient states one or another caused "rib pain with a little breathing problem". Has patient had a PCN reaction causing immediate rash, facial/tongue/throat swelling, SOB or lightheadedness with hypotension: YES Has patient had a PCN reaction causing severe rash involving mucus membranes or skin necrosis: NO Has patient had a PCN reaction that required hospitalization: NO Has patient had a PCN reaction occurring within the last 10 years: NO   Septra [Sulfamethoxazole-Trimethoprim] Other (See Comments)    Patient is unsure if she allergic to septra or penicillin. Patient states one or another caused "rib pain with a little breathing problem".    Immunization History  Administered Date(s) Administered   Influenza Whole 03/29/2019   Influenza-Unspecified 03/06/2019, 04/13/2021   Moderna Sars-Covid-2 Vaccination 02/22/2020, 04/18/2020   Pneumococcal Polysaccharide-23 07/06/2014   Td (Adult),5 Lf Tetanus Toxid, Preservative Free 07/18/1996   Tdap 11/30/2013    Past Medical History:  Diagnosis Date   Anemia    Anxiety    Arthritis    Bipolar 1 disorder (HCC)    CHF (congestive heart failure) ([place])    COPD (chronic obstructive pulmonary disease) ([place])    Depression    Diabetes mellitus without complication (HCC)    Dyspnea  Emphysema of lung (HCC)    GERD (gastroesophageal reflux disease)    Headache    migraines   History of hiatal hernia    History of kidney stones    Hyperlipidemia    Hypertension    Neuromuscular disorder (HCC)    neuropathy   On home oxygen therapy    Pneumonia 2015, 2019   Tracheomalacia    Vaginal Pap smear, abnormal     Tobacco History: Social History   Tobacco Use   Smoking Status Every Day   Packs/day: 2.00   Years: 52.00   Total pack years: 104.00   Types: Cigarettes   Start date: 1969  Smokeless Tobacco Never  Tobacco Comments   Smoking a pack in 24 hours.  02/19/22 hfb   Ready to quit: Not Answered Counseling given: Not Answered Tobacco comments: Smoking a pack in 24 hours.  02/19/22 hfb   Outpatient Medications Prior to Visit  Medication Sig Dispense Refill   acetaminophen (TYLENOL) 500 MG tablet Take 1 tablet (500 mg total) by mouth every 6 (six) hours as needed. (Patient taking differently: Take 1,000 mg by mouth every 6 (six) hours as needed.) 30 tablet 0   albuterol (PROVENTIL) (2.5 MG/3ML) 0.083% nebulizer solution INHALE 1 VIAL VIA NEBULIZER EVERY 4 HOURS 360 mL 2   albuterol (VENTOLIN HFA) 108 (90 Base) MCG/ACT inhaler INHALE 1 OR 2 PUFFS BY MOUTH EVERY 6 HOURS AS NEEDED FOR WHEEZING OR SHORTNESS OF BREATH 8.5 g 0   aspirin EC 81 MG tablet Take 1 tablet (81 mg total) by mouth daily. Swallow whole. 30 tablet 2   Budeson-Glycopyrrol-Formoterol (BREZTRI AEROSPHERE) 160-9-4.8 MCG/ACT AERO Inhale 2 puffs into the lungs 2 (two) times daily. 10.7 g 1   cetirizine (ZYRTEC) 10 MG tablet Take 10 mg by mouth daily as needed for allergies.      clopidogrel (PLAVIX) 75 MG tablet Take 1 tablet (75 mg total) by mouth daily. 30 tablet 2   clotrimazole (MYCELEX) 10 MG troche Take 1 tablet (10 mg total) by mouth 5 (five) times daily. 35 Troche 0   cycloSPORINE (RESTASIS) 0.05 % ophthalmic emulsion Place 1 drop into both eyes 2 (two) times daily as needed (dry eyes).     DULoxetine (CYMBALTA) 20 MG capsule Take 20 mg by mouth daily.     folic acid (FOLVITE) 1 MG tablet Take 1 tablet (1 mg total) by mouth daily. 30 tablet 1   gabapentin (NEURONTIN) 800 MG tablet Take 1 tablet (800 mg total) by mouth 3 (three) times daily as needed.     hydrOXYzine (VISTARIL) 50 MG capsule TAKE 1 CAPSULE BY MOUTH THREE TIMES DAILY AS NEEDED FOR ANXIETY (Patient taking  differently: Take 50 mg by mouth 3 (three) times daily as needed for anxiety.) 90 capsule 0   ipratropium (ATROVENT) 0.02 % nebulizer solution Take 2.5 mLs by nebulization every 4 (four) hours as needed for wheezing or shortness of breath.     Ipratropium-Albuterol (COMBIVENT RESPIMAT) 20-100 MCG/ACT AERS respimat 1 puff as needed 4 g 2   meloxicam (MOBIC) 15 MG tablet Take 15 mg by mouth daily as needed for pain.     metFORMIN (GLUCOPHAGE) 1000 MG tablet Take 1,000 mg by mouth 2 (two) times daily.     nitroGLYCERIN (NITROSTAT) 0.4 MG SL tablet Place under the tongue.     olmesartan-hydrochlorothiazide (BENICAR HCT) 20-12.5 MG tablet Take 1 tablet by mouth daily.     ondansetron (ZOFRAN-ODT) 4 MG disintegrating tablet Take 4 mg by  mouth 2 (two) times daily as needed for nausea/vomiting.     pantoprazole (PROTONIX) 40 MG tablet Take 40 mg by mouth daily.     predniSONE (DELTASONE) 5 MG tablet Take 1 tablet (5 mg total) by mouth daily with breakfast. 30 tablet 2   rOPINIRole (REQUIP) 1 MG tablet Take 1 mg by mouth 3 (three) times daily.      rosuvastatin (CRESTOR) 10 MG tablet Take 1 tablet (10 mg total) by mouth daily. 90 tablet 3   SUMAtriptan (IMITREX) 25 MG tablet Take 25 mg by mouth every 2 (two) hours as needed for migraine.     thiamine 100 MG tablet Take 1 tablet (100 mg total) by mouth daily. 30 tablet 1   torsemide (DEMADEX) 20 MG tablet Take 1 tablet (20 mg total) by mouth daily as needed. May take an additional 20 mg daily as needed for extreme swelling. (Patient not taking: Reported on 02/26/2022) 30 tablet 0   No facility-administered medications prior to visit.     Review of Systems:   Constitutional:   No  weight loss, night sweats,  Fevers, chills,  +fatigue, or  lassitude.  HEENT:   No headaches,  Difficulty swallowing,  Tooth/dental problems, or  Sore throat,                No sneezing, itching, ear ache,  +nasal congestion, post nasal drip,   CV:  No chest pain,   Orthopnea, PND, swelling in lower extremities, anasarca, dizziness, palpitations, syncope.   GI  No heartburn, indigestion, abdominal pain, nausea, vomiting, diarrhea, change in bowel habits, loss of appetite, bloody stools.   Resp: .  No chest wall deformity  Skin: no rash or lesions.  GU: no dysuria, change in color of urine, no urgency or frequency.  No flank pain, no hematuria   MS:  No joint pain or swelling.  No decreased range of motion.  No back pain.    Physical Exam  BP 110/60 (BP Location: Left Arm, Patient Position: Sitting, Cuff Size: Normal)   Pulse 88   Temp 97.9 F (36.6 C) (Oral)   Ht 5\' 4"  (1.626 m)   Wt 204 lb 3.2 oz (92.6 kg)   SpO2 92%   BMI 35.05 kg/m   GEN: A/Ox3; pleasant , NAD, chronically ill-appearing, on oxygen   HEENT:  [place]/AT,   NOSE-clear, THROAT-clear, no lesions, no postnasal drip or exudate noted.   NECK:  Supple w/ fair ROM; no JVD; normal carotid impulses w/o bruits; no thyromegaly or nodules palpated; no lymphadenopathy.    RESP scattered rhonchi and few expiratory wheezes no  accessory muscle use, no dullness to percussion  CARD:  RRR, no m/r/g, tr  peripheral edema, pulses intact, no cyanosis or clubbing.  GI:   Soft & nt; nml bowel sounds; no organomegaly or masses detected.   Musco: Warm bil, no deformities or joint swelling noted.   Neuro: alert, no focal deficits noted.    Skin: Warm, no lesions or rashes    Lab Results:    BMET   BNP   Imaging: CT Chest W Contrast  Result Date: 03/22/2022 CLINICAL DATA:  COPD.  Follow-up pulmonary nodule. * Tracking Code: BO * EXAM: CT CHEST WITH CONTRAST TECHNIQUE: Multidetector CT imaging of the chest was performed during intravenous contrast administration. RADIATION DOSE REDUCTION: This exam was performed according to the departmental dose-optimization program which includes automated exposure control, adjustment of the mA and/or kV according to patient size and/or use  of  iterative reconstruction technique. CONTRAST:  46mL OMNIPAQUE IOHEXOL 300 MG/ML  SOLN COMPARISON:  01/12/2022 chest radiograph. 09/18/2019 high-resolution chest CT. FINDINGS: Cardiovascular: Normal heart size. No significant pericardial effusion/thickening. Three-vessel coronary atherosclerosis. Mild lipomatous hypertrophy of the interatrial septum. Atherosclerotic nonaneurysmal thoracic aorta. Normal caliber pulmonary arteries. No central pulmonary emboli. Mediastinum/Nodes: No significant thyroid nodules. Unremarkable esophagus. No pathologically enlarged axillary, mediastinal or hilar lymph nodes. Lungs/Pleura: No pneumothorax. No pleural effusion. Cavitary solid 2.7 x 1.9 cm peripheral apical left upper lobe pulmonary nodule abutting peripheral pleura (series 4/image 26), significantly increased from 0.6 x 0.6 cm on 09/18/2019 chest CT. No acute consolidative airspace disease or additional significant pulmonary nodules. Diffuse patchy peribronchovascular ground-glass opacity and reticulation in both lungs is similar. No frank honeycombing. No significant bronchiectasis. Upper abdomen: No acute abnormality. Musculoskeletal: No aggressive appearing focal osseous lesions. Mild thoracic spondylosis. Focal soft tissue asymmetry in the upper outer left breast is not appreciably changed by CT (series 2/image 54). IMPRESSION: 1. Cavitary solid 2.7 cm peripheral apical left upper lobe pulmonary nodule, significantly increased in size since 09/18/2019 chest CT, highly suspicious for primary bronchogenic carcinoma. Multidisciplinary thoracic oncology consultation and PET-CT suggested for further evaluation at this time. 2. No thoracic adenopathy. 3. Focal soft tissue asymmetry in the upper outer left breast, not appreciably changed by CT. Please ensure patient is up-to-date with mammographic screening. 4. Diffuse patchy peribronchovascular ground-glass opacity and reticulation in both lungs, similar to prior. An indolent  interstitial lung disease such as NSIP or chronic hypersensitivity pneumonitis is not excluded. 5. Three-vessel coronary atherosclerosis. 6. Aortic Atherosclerosis (ICD10-I70.0). These results will be called to the ordering clinician or representative by the Radiologist Assistant, and communication documented in the PACS or [employer]. Electronically Signed   By: Ilona Sorrel M.D.   On: 03/22/2022 21:44         Latest Ref Rng & Units 07/29/2020    9:06 AM 07/14/2016    9:05 AM 12/06/2013   10:00 AM  PFT Results  FVC-Pre L 1.48  1.48    FVC-Predicted Pre % 46  45  52      FVC-Post L 1.58   1.93      FVC-Predicted Post % 49   57      Pre FEV1/FVC % % 81  87  82      Post FEV1/FCV % % 77   82      FEV1-Pre L 1.19  1.29  1.46      FEV1-Predicted Pre % 49  51  55      FEV1-Post L 1.22   1.58      DLCO uncorrected ml/min/mmHg 10.04   14.71      DLCO UNC% % 50   60      DLCO corrected ml/min/mmHg 10.23   14.75      DLCO COR %Predicted % 51   60      DLVA Predicted % 82   100      TLC L 4.94   3.73      TLC % Predicted % 97   73      RV % Predicted % 158   99         This result is from an external source.    No results found for: "NITRICOXIDE"      Assessment & Plan:   COPD exacerbation ([place]) Recurrent COPD exacerbation and active smoker.  Patient smoking cessation was encouraged in detail. We will treat with  a 7-day course of Levaquin and prednisone burst 10 mg dail for 1 week and then 5 mg daily Triple therapy with Breztri  Plan   Patient Instructions  Levaquin 500mg  daily 7 days  Mucinex DM Twice daily  As needed  cough/congestion  Tessalon Three times a day  As needed  cough .  Increase Prednisone 10 mg daily for 1 week , then 5mg  daily  Continue on Breztri 2 puffs twice daily, rinse after use Albuterol inhaler or nebulizer as needed Continue on oxygen 4 to 5 L to maintain O2 saturations greater than 88 to 90% Look at the support group for addiction.  Continue  with counseling .  Set up PET scan [facility])  Set up Diagnostic Mammogram for abnormal area on breast Follow up with cardiology next month.  Follow up with Primary provider regarding abnormal  area on breast, and Diabetes  Follow up in 2 months with Salif Tay NP or [name] .  Please contact office for sooner follow up if symptoms do not improve or worsen or seek emergency care       Cigarette smoker Smoking cessation discussed in detail  Lung nodule Abnormal CT chest with a 2.4 cavitary solid apical left upper lobe nodule concerning for underlying malignancy.  We will set PET scan up.  Once PET is returned consider possible a CT-guided biopsy for tissue diagnosis if PET positive  Plan Patient Instructions  Levaquin 500mg  daily 7 days  Mucinex DM Twice daily  As needed  cough/congestion  Tessalon Three times a day  As needed  cough .  Increase Prednisone 10 mg daily for 1 week , then 5mg  daily  Continue on Breztri 2 puffs twice daily, rinse after use Albuterol inhaler or nebulizer as needed Continue on oxygen 4 to 5 L to maintain O2 saturations greater than 88 to 90% Look at the support group for addiction.  Continue with counseling .  Set up PET scan [facility])  Set up Diagnostic Mammogram for abnormal area on breast Follow up with cardiology next month.  Follow up with Primary provider regarding abnormal  area on breast, and Diabetes  Follow up in 2 months with Nailyn Dearinger NP or [name] .  Please contact office for sooner follow up if symptoms do not improve or worsen or seek emergency care      Polysubstance abuse [facility]) Continue with counseling.  Respiratory bronchiolitis associated interstitial lung disease ([place]) Abnormal CT chest with groundglass opacity and reticulation.  Concerning for possible NSIP versus chronic hypersensitive pneumonitis and a heavy smoker.  We will check labs with sed rate, ANA, rheumatoid factor and CCP.  Along with hypersensitivity panel Smoking  cessation was discussed in detail.  Plan  Patient Instructions  Levaquin 500mg  daily 7 days  Mucinex DM Twice daily  As needed  cough/congestion  Tessalon Three times a day  As needed  cough .  Increase Prednisone 10 mg daily for 1 week , then 5mg  daily  Continue on Breztri 2 puffs twice daily, rinse after use Albuterol inhaler or nebulizer as needed Continue on oxygen 4 to 5 L to maintain O2 saturations greater than 88 to 90% Look at the support group for addiction.  Continue with counseling .  Set up PET scan [facility])  Set up Diagnostic Mammogram for abnormal area on breast Follow up with cardiology next month.  Follow up with Primary provider regarding abnormal  area on breast, and Diabetes  Follow up in 2 months with  Marguita Venning NP or [name] .  Please contact office for sooner follow up if symptoms do not improve or worsen or seek emergency care      Breast mass Abnormal breast soft tissue asymmetry on CT scan.  Will need a diagnostic mammogram.- Advised to follow-up with primary care provider going forward.  We will set up for diagnostic mammogram.   Plan  Patient Instructions  Levaquin 500mg  daily 7 days  Mucinex DM Twice daily  As needed  cough/congestion  Tessalon Three times a day  As needed  cough .  Increase Prednisone 10 mg daily for 1 week , then 5mg  daily  Continue on Breztri 2 puffs twice daily, rinse after use Albuterol inhaler or nebulizer as needed Continue on oxygen 4 to 5 L to maintain O2 saturations greater than 88 to 90% Look at the support group for addiction.  Continue with counseling .  Set up PET scan [facility])  Set up Diagnostic Mammogram for abnormal area on breast Follow up with cardiology next month.  Follow up with Primary provider regarding abnormal  area on breast, and Diabetes  Follow up in 2 months with Farris Blash NP or [name] .  Please contact office for sooner follow up if symptoms do not improve or worsen or seek emergency care       DM (diabetes mellitus), type 2 ([place]) Advised to follow-up with primary care provider.  As she is on chronic steroids currently.   I spent   45 minutes dedicated to the care of this patient on the date of this encounter to include pre-visit review of records, face-to-face time with the patient discussing conditions above, post visit ordering of testing, clinical documentation with the electronic health record, making appropriate referrals as documented, and communicating necessary findings to members of the patients care team.    Rexene Edison, NP 03/30/2022

## 2022-03-30 NOTE — Assessment & Plan Note (Signed)
Abnormal CT chest with groundglass opacity and reticulation.  Concerning for possible NSIP versus chronic hypersensitive pneumonitis and a heavy smoker.  We will check labs with sed rate, ANA, rheumatoid factor and CCP.  Along with hypersensitivity panel Smoking cessation was discussed in detail.  Plan  Patient Instructions  Levaquin 500mg  daily 7 days  Mucinex DM Twice daily  As needed  cough/congestion  Tessalon Three times a day  As needed  cough .  Increase Prednisone 10 mg daily for 1 week , then 5mg  daily  Continue on Breztri 2 puffs twice daily, rinse after use Albuterol inhaler or nebulizer as needed Continue on oxygen 4 to 5 L to maintain O2 saturations greater than 88 to 90% Look at the support group for addiction.  Continue with counseling .  Set up PET scan [facility])  Set up Diagnostic Mammogram for abnormal area on breast Follow up with cardiology next month.  Follow up with Primary provider regarding abnormal  area on breast, and Diabetes  Follow up in 2 months with Tamy Accardo NP or [name] .  Please contact office for sooner follow up if symptoms do not improve or worsen or seek emergency care

## 2022-03-30 NOTE — Assessment & Plan Note (Signed)
Advised to follow-up with primary care provider.  As she is on chronic steroids currently.

## 2022-04-01 ENCOUNTER — Telehealth: Payer: Self-pay | Admitting: Internal Medicine

## 2022-04-01 LAB — ANTI-NUCLEAR AB-TITER (ANA TITER): ANA Titer 1: 1:80 {titer} — ABNORMAL HIGH

## 2022-04-01 LAB — ANA: Anti Nuclear Antibody (ANA): POSITIVE — AB

## 2022-04-01 LAB — CYCLIC CITRUL PEPTIDE ANTIBODY, IGG: Cyclic Citrullin Peptide Ab: <16 UNITS

## 2022-04-01 LAB — RHEUMATOID FACTOR: Rheumatoid fact SerPl-aCnc: <14 IU/mL (ref <14)

## 2022-04-01 NOTE — Telephone Encounter (Signed)
Patient requesting refill of albuterol sent to [facility] pharmacy told her to call the office. Please call patient when refill is sent in.  Please advise.

## 2022-04-02 LAB — HYPERSENSITIVITY PNEUMONITIS
A. Pullulans Abs: NEGATIVE
A.Fumigatus #1 Abs: NEGATIVE
Micropolyspora faeni, IgG: NEGATIVE
Pigeon Serum Abs: NEGATIVE
Thermoact. Saccharii: NEGATIVE
Thermoactinomyces vulgaris, IgG: NEGATIVE

## 2022-04-02 MED ORDER — ALBUTEROL SULFATE HFA 108 (90 BASE) MCG/ACT IN AERS: INHALATION_SPRAY | RESPIRATORY_TRACT | 2 refills | Status: DC

## 2022-04-02 NOTE — Telephone Encounter (Signed)
Patient calling back to get refill of albuterol called in- patient is completely out.

## 2022-04-02 NOTE — Telephone Encounter (Signed)
Called and spoke with patient. She verified she needed the albuterol inhaler sent to the pharmacy. Patient verified pharmacy. Rx has been sent.   Nothing further needed.

## 2022-04-02 NOTE — Telephone Encounter (Signed)
ATC LVMTCB x1 we need to know which albuterol she wants refilled.

## 2022-04-06 DIAGNOSIS — R6889 Other general symptoms and signs: Secondary | ICD-10-CM | POA: Insufficient documentation

## 2022-04-06 DIAGNOSIS — Z9981 Dependence on supplemental oxygen: Secondary | ICD-10-CM | POA: Insufficient documentation

## 2022-04-08 ENCOUNTER — Encounter (HOSPITAL_COMMUNITY)
Admission: RE | Admit: 2022-04-08 | Discharge: 2022-04-08 | Disposition: A | Discharge status: Home / Self Care | Payer: Medicare Other | Source: Ambulatory Visit | Attending: Adult Health | Admitting: Adult Health

## 2022-04-08 ENCOUNTER — Telehealth: Payer: Self-pay | Admitting: Adult Health

## 2022-04-08 DIAGNOSIS — M87852 Other osteonecrosis, left femur: Secondary | ICD-10-CM | POA: Insufficient documentation

## 2022-04-08 DIAGNOSIS — M87851 Other osteonecrosis, right femur: Secondary | ICD-10-CM | POA: Insufficient documentation

## 2022-04-08 DIAGNOSIS — R918 Other nonspecific abnormal finding of lung field: Secondary | ICD-10-CM | POA: Diagnosis not present

## 2022-04-08 DIAGNOSIS — I2510 Atherosclerotic heart disease of native coronary artery without angina pectoris: Secondary | ICD-10-CM | POA: Diagnosis not present

## 2022-04-08 DIAGNOSIS — I700 Atherosclerosis of aorta: Secondary | ICD-10-CM | POA: Diagnosis not present

## 2022-04-08 DIAGNOSIS — N6320 Unspecified lump in the left breast, unspecified quadrant: Secondary | ICD-10-CM | POA: Insufficient documentation

## 2022-04-08 DIAGNOSIS — R911 Solitary pulmonary nodule: Secondary | ICD-10-CM | POA: Diagnosis not present

## 2022-04-08 MED ORDER — FLUDEOXYGLUCOSE F - 18 (FDG) INJECTION
10.6100 | Freq: Once | INTRAVENOUS | Status: AC | PRN
  Administered 2022-04-08: 10.61 via INTRAVENOUS

## 2022-04-08 MED ORDER — PREDNISONE 5 MG PO TABS: 5.0000 mg | ORAL_TABLET | Freq: Every day | ORAL | 0 refills | Status: DC

## 2022-04-08 NOTE — Telephone Encounter (Signed)
Called the pt and there was no answer- LMTCB and I have sent pred to pharm

## 2022-04-14 ENCOUNTER — Telehealth: Payer: Self-pay | Admitting: Adult Health

## 2022-04-14 NOTE — Telephone Encounter (Signed)
Called the pt and scheduled her appt with Katie for Video visit on 04/16/22 Advised seek emergent care sooner if needed

## 2022-04-14 NOTE — Telephone Encounter (Signed)
Called and spoke with patient. Patient wants to know if she can get antibiotics because of her cough. Patient stated she is not better from last week. She's had prednisone but she isn't better. Patient also stated she is coughing up green mucus.   NM, please advise?

## 2022-04-16 ENCOUNTER — Telehealth: Payer: Medicare Other | Admitting: Nurse Practitioner

## 2022-04-19 ENCOUNTER — Other Ambulatory Visit: Payer: Self-pay | Admitting: Adult Health

## 2022-04-20 ENCOUNTER — Encounter (HOSPITAL_COMMUNITY): Payer: Medicare Other

## 2022-04-20 ENCOUNTER — Encounter: Payer: Self-pay | Admitting: Student

## 2022-04-20 ENCOUNTER — Ambulatory Visit: Payer: Medicare Other | Attending: Student | Admitting: Student

## 2022-04-20 VITALS — BP 138/62 | HR 91 | Ht 64.0 in | Wt 205.4 lb

## 2022-04-20 DIAGNOSIS — I2510 Atherosclerotic heart disease of native coronary artery without angina pectoris: Secondary | ICD-10-CM | POA: Diagnosis not present

## 2022-04-20 DIAGNOSIS — F1910 Other psychoactive substance abuse, uncomplicated: Secondary | ICD-10-CM

## 2022-04-20 DIAGNOSIS — I255 Ischemic cardiomyopathy: Secondary | ICD-10-CM

## 2022-04-20 DIAGNOSIS — I10 Essential (primary) hypertension: Secondary | ICD-10-CM | POA: Diagnosis not present

## 2022-04-20 DIAGNOSIS — E785 Hyperlipidemia, unspecified: Secondary | ICD-10-CM | POA: Diagnosis not present

## 2022-04-20 NOTE — Progress Notes (Signed)
ATC x1.  LVM to return call. 

## 2022-04-20 NOTE — Progress Notes (Signed)
Cardiology Office Note    Date:  04/20/2022   ID:  [name], DOB 10/10/1955, MRN [id number]  PCP:  Yves Dill, NP  Cardiologist: Carlyle Dolly, MD    Chief Complaint  Patient presents with   Follow-up    3 month visit    History of Present Illness:    [name] is a 66 y.o. female with past medical history of coronary calcifications (by prior CT Imaging with low-risk NST in 2015, NSTEMI in 01/2022 and medical management pursued given her multiple medical issues and substance abuse), HFmrEF (EF 40-45% by echo in 01/2022), HTN, HLD, Type II DM, cocaine use and COPD who presents to the office today for 40-month follow-up.  She was last examined by Cecilie Kicks, NP in 01/2022 following her recent hospitalization for an NSTEMI during which medical management had been recommended with plans for an outpatient cardiac catheterization. She denied any recurrent chest pain and reported her breathing had been at baseline. A cardiac catheterization was recommended for definitive evaluation and she wanted to call back to schedule this. This was scheduled for 7/26 and she was unable to make the appointment and then the catheterization was rescheduled for 8/25 and she no showed for this as well. Dr. Harl Bowie recommended not to reschedule at that time and to arrange for follow-up in the next few months.  In talking with the patient and her husband today, she reports overall feeling well since her last office visit.  She does have dyspnea on exertion in the setting of COPD and is on 4 L nasal cannula at baseline. She denies any recent chest pain or palpitations. No specific orthopnea, PND or pitting edema. Says she was unable to attend prior appointments for her cardiac catheterizations as they rely on [facility] for transportation and the transportation provided by her insurance did not come to their house the day of the procedure.    Past Medical History:  Diagnosis  Date   Anemia    Anxiety    Arthritis    Bipolar 1 disorder (HCC)    CHF (congestive heart failure) (HCC)    COPD (chronic obstructive pulmonary disease) (HCC)    Depression    Diabetes mellitus without complication (HCC)    Dyspnea    Emphysema of lung (HCC)    GERD (gastroesophageal reflux disease)    Headache    migraines   History of hiatal hernia    History of kidney stones    Hyperlipidemia    Hypertension    Neuromuscular disorder (HCC)    neuropathy   On home oxygen therapy    Pneumonia 2015, 2019   Tracheomalacia    Vaginal Pap smear, abnormal     Past Surgical History:  Procedure Laterality Date   CATARACT EXTRACTION W/PHACO Left 01/14/2020   Procedure: CATARACT EXTRACTION PHACO AND INTRAOCULAR LENS PLACEMENT LEFT EYE;  Surgeon: Baruch Goldmann, MD;  Location: [facility];  Service: Ophthalmology;  Laterality: Left;  CDE: 8.18   CATARACT EXTRACTION W/PHACO Right 02/01/2020   Procedure: CATARACT EXTRACTION PHACO AND INTRAOCULAR LENS PLACEMENT RIGHT EYE;  Surgeon: Baruch Goldmann, MD;  Location: [facility];  Service: Ophthalmology;  Laterality: Right;  CDE: 9.94   CHEILECTOMY Right 01/28/2020   Procedure: KELLER BUNION IMPLANT RIGHT FOOT CHEILECTOMY RIGHT ROOT;  Surgeon: Landis Martins, DPM;  Location: [facility];  Service: Podiatry;  Laterality: Right;  BLOCK   CHOLECYSTECTOMY     COLONOSCOPY  July 2010   Dr. Arnoldo Morale: 3 rectal  polyps, not enough tissue for pathologic examination, recommended surveillance in 3 years   COLONOSCOPY N/A 10/23/2014   RMR: Multiple colonic polyps removed as described above. No endoscopic explaniation for abdominal pain. however. next tcs 10/2019   ESOPHAGOGASTRODUODENOSCOPY  July 2010   Dr. Arnoldo Morale: gastritis and duodenitis, H.pylori negative   ESOPHAGOGASTRODUODENOSCOPY N/A 10/23/2014   RMR: Normal EGD. Status post passage of a Maloney dilator. Today's finding s would not explain abdominal pain   EYE SURGERY Left 01/14/2020   cataract removal   GANGLION  CYST EXCISION Left 09/07/2018   Procedure: REMOVAL GANGLION OF WRIST;  Surgeon: Carole Civil, MD;  Location: [facility];  Service: Orthopedics;  Laterality: Left;   HERNIA REPAIR     Dr. Arnoldo Morale   KIDNEY STONE SURGERY     MALONEY DILATION N/A 10/23/2014   Procedure: Venia Minks DILATION;  Surgeon: Daneil Dolin, MD;  [facility];  Service: Endoscopy;  Laterality: N/A;   OPEN REDUCTION INTERNAL FIXATION (ORIF) DISTAL RADIAL FRACTURE Right 12/09/2015   Procedure: OPEN REDUCTION INTERNAL FIXATION (ORIF) RIGHT DISTAL RADIUS;  Surgeon: Leanora Cover, MD;  Location: [facility];  Service: Orthopedics;  Laterality: Right;    Current Medications: Outpatient Medications Prior to Visit  Medication Sig Dispense Refill   acetaminophen (TYLENOL) 500 MG tablet Take 1 tablet (500 mg total) by mouth every 6 (six) hours as needed. (Patient taking differently: Take 1,000 mg by mouth every 6 (six) hours as needed.) 30 tablet 0   albuterol (PROVENTIL) (2.5 MG/3ML) 0.083% nebulizer solution INHALE 1 VIAL VIA NEBULIZER EVERY 4 HOURS 360 mL 2   albuterol (VENTOLIN HFA) 108 (90 Base) MCG/ACT inhaler INHALE 1 OR 2 PUFFS BY MOUTH EVERY 6 HOURS AS NEEDED FOR WHEEZING OR SHORTNESS OF BREATH 8.5 g 2   aspirin EC 81 MG tablet Take 1 tablet (81 mg total) by mouth daily. Swallow whole. 30 tablet 2   Budeson-Glycopyrrol-Formoterol (BREZTRI AEROSPHERE) 160-9-4.8 MCG/ACT AERO Inhale 2 puffs into the lungs 2 (two) times daily. 10.7 g 1   cetirizine (ZYRTEC) 10 MG tablet Take 10 mg by mouth daily as needed for allergies.      clopidogrel (PLAVIX) 75 MG tablet Take 1 tablet (75 mg total) by mouth daily. 30 tablet 2   clotrimazole (MYCELEX) 10 MG troche Take 1 tablet (10 mg total) by mouth 5 (five) times daily. 35 Troche 0   cycloSPORINE (RESTASIS) 0.05 % ophthalmic emulsion Place 1 drop into both eyes 2 (two) times daily as needed (dry eyes).     DULoxetine (CYMBALTA) 20 MG capsule Take 20 mg by mouth daily.      folic acid (FOLVITE) 1 MG tablet Take 1 tablet (1 mg total) by mouth daily. 30 tablet 1   gabapentin (NEURONTIN) 800 MG tablet Take 1 tablet (800 mg total) by mouth 3 (three) times daily as needed.     hydrOXYzine (VISTARIL) 50 MG capsule TAKE 1 CAPSULE BY MOUTH THREE TIMES DAILY AS NEEDED FOR ANXIETY (Patient taking differently: Take 50 mg by mouth 3 (three) times daily as needed for anxiety.) 90 capsule 0   ipratropium (ATROVENT) 0.02 % nebulizer solution Take 2.5 mLs by nebulization every 4 (four) hours as needed for wheezing or shortness of breath.     Ipratropium-Albuterol (COMBIVENT RESPIMAT) 20-100 MCG/ACT AERS respimat 1 puff as needed 4 g 2   metFORMIN (GLUCOPHAGE) 1000 MG tablet Take 1,000 mg by mouth 2 (two) times daily.     nitroGLYCERIN (NITROSTAT) 0.4 MG SL tablet  Place under the tongue.     olmesartan-hydrochlorothiazide (BENICAR HCT) 20-12.5 MG tablet Take 1 tablet by mouth daily.     ondansetron (ZOFRAN-ODT) 4 MG disintegrating tablet Take 4 mg by mouth 2 (two) times daily as needed for nausea/vomiting.     pantoprazole (PROTONIX) 40 MG tablet Take 40 mg by mouth daily.     predniSONE (DELTASONE) 10 MG tablet Take 1 tablet (10 mg total) by mouth daily with breakfast. 7 tablet 0   predniSONE (DELTASONE) 5 MG tablet Take 1 tablet (5 mg total) by mouth daily with breakfast. 30 tablet 0   rOPINIRole (REQUIP) 1 MG tablet Take 1 mg by mouth 3 (three) times daily.      rosuvastatin (CRESTOR) 10 MG tablet Take 1 tablet (10 mg total) by mouth daily. 90 tablet 3   SUMAtriptan (IMITREX) 25 MG tablet Take 25 mg by mouth every 2 (two) hours as needed for migraine.     thiamine 100 MG tablet Take 1 tablet (100 mg total) by mouth daily. 30 tablet 1   torsemide (DEMADEX) 20 MG tablet Take 1 tablet (20 mg total) by mouth daily as needed. May take an additional 20 mg daily as needed for extreme swelling. 30 tablet 0   meloxicam (MOBIC) 15 MG tablet Take 15 mg by mouth daily as needed for pain.      No facility-administered medications prior to visit.     Allergies:   Penicillins and Septra [sulfamethoxazole-trimethoprim]   Social History   Socioeconomic History   Marital status: Married    Spouse name: Not on file   Number of children: Not on file   Years of education: Not on file   Highest education level: Not on file  Occupational History   Occupation: disability  Tobacco Use   Smoking status: Every Day    Packs/day: 2.00    Years: 52.00    Total pack years: 104.00    Types: Cigarettes    Start date: 1969   Smokeless tobacco: Never   Tobacco comments:    Smoking a pack in 24 hours.  02/19/22 hfb  Vaping Use   Vaping Use: Never used  Substance and Sexual Activity   Alcohol use: Yes    Alcohol/week: 1.0 standard drink of alcohol    Types: 1 Standard drinks or equivalent per week    Comment: alcohol abuse   Drug use: Yes    Types: Cocaine   Sexual activity: Not Currently    Birth control/protection: Post-menopausal  Other Topics Concern   Not on file  Social History Narrative   Not on file   Social Determinants of Health   Financial Resource Strain: High Risk (12/31/2020)   Overall Financial Resource Strain (CARDIA)    Difficulty of Paying Living Expenses: Hard  Food Insecurity: No Food Insecurity (12/31/2020)   Hunger Vital Sign    Worried About Running Out of Food in the Last Year: Never true    Ran Out of Food in the Last Year: Never true  Transportation Needs: No Transportation Needs (12/31/2020)   PRAPARE - Hydrologist (Medical): No    Lack of Transportation (Non-Medical): No  Physical Activity: Insufficiently Active (12/31/2020)   Exercise Vital Sign    Days of Exercise per Week: 2 days    Minutes of Exercise per Session: 10 min  Stress: Stress Concern Present (12/31/2020)   [place]    Feeling of Stress :  Very much  Social Connections: Moderately  Isolated (12/31/2020)   Social Connection and Isolation Panel [NHANES]    Frequency of Communication with Friends and Family: Three times a week    Frequency of Social Gatherings with Friends and Family: Once a week    Attends Religious Services: Never    Marine scientist or Organizations: No    Attends Archivist Meetings: Never    Marital Status: Married     Family History:  The patient's family history is not on file. She was adopted.   Review of Systems:    Please see the history of present illness.     All other systems reviewed and are otherwise negative except as noted above.   Physical Exam:    VS:  BP 138/62   Pulse 91   Ht 5\' 4"  (1.626 m)   Wt 205 lb 6.4 oz (93.2 kg)   SpO2 93%   BMI 35.26 kg/m    General: Pleasant female appearing in no acute distress. Head: Normocephalic, atraumatic. Neck: No carotid bruits. JVD not elevated.  Lungs: Respirations regular and unlabored. Wheezing along upper lung fields bilaterally. On 4L [place].  Heart: Regular rate and rhythm. No S3 or S4.  No murmur, no rubs, or gallops appreciated. Abdomen: Appears non-distended. No obvious abdominal masses. Msk:  Strength and tone appear normal for age. No obvious joint deformities or effusions. Extremities: No clubbing or cyanosis. No pitting edema.  Distal pedal pulses are 2+ bilaterally. Neuro: Alert and oriented X 3. Moves all extremities spontaneously. No focal deficits noted. Psych:  Responds to questions appropriately with a normal affect. Skin: No rashes or lesions noted  Wt Readings from Last 3 Encounters:  04/20/22 205 lb 6.4 oz (93.2 kg)  03/30/22 204 lb 3.2 oz (92.6 kg)  02/23/22 204 lb (92.5 kg)     Studies/Labs Reviewed:   EKG:  EKG is not ordered today.  Recent Labs: 01/02/2022: ALT 27 01/05/2022: B Natriuretic Peptide 130.1 01/06/2022: Magnesium 1.8 03/30/2022: BUN 16; Creatinine, Ser 0.82; Hemoglobin 11.5; Platelets 308.0; Potassium 4.3; Sodium 131   Lipid  Panel    Component Value Date/Time   CHOL 197 07/18/2015 1113   TRIG 144 07/18/2015 1113   HDL 41 07/18/2015 1113   CHOLHDL 4.8 (H) 07/18/2015 1113   CHOLHDL 3.1 03/07/2014 0343   VLDL 40 03/07/2014 0343   LDLCALC 127 (H) 07/18/2015 1113    Additional studies/ records that were reviewed today include:   Echocardiogram: 01/2022 IMPRESSIONS     1. Septal and apical hypokinesis . Left ventricular ejection fraction, by  estimation, is 40 to 45%. The left ventricle has mildly decreased  function. The left ventricle demonstrates regional wall motion  abnormalities (see scoring diagram/findings for  description). The left ventricular internal cavity size was mildly  dilated. Left ventricular diastolic parameters are indeterminate.   2. Right ventricular systolic function is normal. The right ventricular  size is normal.   3. The mitral valve is normal in structure. No evidence of mitral valve  regurgitation.   4. The aortic valve is tricuspid. There is mild calcification of the  aortic valve. Aortic valve regurgitation is not visualized. Aortic valve  sclerosis is present, with no evidence of aortic valve stenosis.   Assessment:    1. Ischemic cardiomyopathy   2. Coronary artery disease involving native coronary artery of native heart without angina pectoris   3. Essential hypertension   4. Hyperlipidemia LDL goal <70   5. Polysubstance  abuse ([place])      Plan:   In order of problems listed above:  1. HFmrEF - Echocardiogram in 01/2022 showed her EF was mildly reduced at 40 to 45% with septal and apical hypokinesis. She appears euvolemic today and reports her respiratory status has been stable on 4 L nasal cannula in the setting of COPD. - Will plan to obtain a follow-up limited echocardiogram for reassessment of her EF as she prefers conservative management if this has normalized. If her EF remains reduced, would plan for a cardiac catheterization as previously discussed at  prior office visits and also adjust medical therapy. She is currently on Olmesartan-HCTZ 20-12.5 mg daily. Would anticipate stopping Olmesartan and switching this to Entresto. Could also add an SGLT2 inhibitor and possibly Spironolactone over time. She has not been on beta-blocker therapy given her COPD and also due to cocaine use.  2. Coronary Calcification by CT/History of NSTEMI - She did have an NSTEMI in 01/2022 and medical management was pursued at that time. An outpatient catheterization was previously recommended but she was unable to attend this due to transportation issues.  We reviewed options today and given no recent anginal symptoms, she prefers to hold off on this if able.  Therefore, will plan to obtain a follow-up limited echocardiogram for reassessment of her EF. If this remains reduced, she is in agreement with rescheduling her cardiac catheterization. - Continue ASA 81 mg daily, Plavix 75 mg daily and Crestor 10 mg daily.  3. HTN - Her blood pressure is at 138/62 during today's visit. She is currently on Olmesartan-HCTZ 20-12.5 mg daily. Pending repeat echocardiogram results, would consider switching Olmesartan to Entresto or adding Spironolactone.  4. HLD - She did have recent labs with her PCP and we will request a copy of these. She has remained on Crestor 10 mg daily.  5. History of Cocaine Use - She reports using once since her hospitalization in 01/2022. We reviewed the risks with this and cessation was advised.   Medication Adjustments/Labs and Tests Ordered: Current medicines are reviewed at length with the patient today.  Concerns regarding medicines are outlined above.  Medication changes, Labs and Tests ordered today are listed in the Patient Instructions below. Patient Instructions  Medication Instructions:  Your physician recommends that you continue on your current medications as directed. Please refer to the Current Medication list given to you  today.   Labwork: None  Testing/Procedures: Your physician has requested that you have an echocardiogram. Echocardiography is a painless test that uses sound waves to create images of your heart. It provides your doctor with information about the size and shape of your heart and how well your heart's chambers and valves are working. This procedure takes approximately one hour. There are no restrictions for this procedure. Please do NOT wear cologne, perfume, aftershave, or lotions (deodorant is allowed). Please arrive 15 minutes prior to your appointment time.   Follow-Up: Follow up with Dr. Harl Bowie in 3 months.   Any Other Special Instructions Will Be Listed Below (If Applicable).     If you need a refill on your cardiac medications before your next appointment, please call your pharmacy.    Signed, Erma Heritage, PA-C  04/20/2022 5:22 PM    [facility] [address] [place], [place] [address] Phone: [phone] Fax: [phone]

## 2022-04-20 NOTE — Patient Instructions (Signed)
Medication Instructions:  Your physician recommends that you continue on your current medications as directed. Please refer to the Current Medication list given to you today.   Labwork: None  Testing/Procedures: Your physician has requested that you have an echocardiogram. Echocardiography is a painless test that uses sound waves to create images of your heart. It provides your doctor with information about the size and shape of your heart and how well your heart's chambers and valves are working. This procedure takes approximately one hour. There are no restrictions for this procedure. Please do NOT wear cologne, perfume, aftershave, or lotions (deodorant is allowed). Please arrive 15 minutes prior to your appointment time.   Follow-Up: Follow up with Dr. Harl Bowie in 3 months.   Any Other Special Instructions Will Be Listed Below (If Applicable).     If you need a refill on your cardiac medications before your next appointment, please call your pharmacy.

## 2022-04-21 NOTE — Progress Notes (Signed)
ATC x2.  VM full.

## 2022-04-22 ENCOUNTER — Ambulatory Visit (HOSPITAL_COMMUNITY)
Admission: RE | Admit: 2022-04-22 | Discharge: 2022-04-22 | Disposition: A | Discharge status: Home / Self Care | Payer: Medicare Other | Source: Ambulatory Visit | Attending: Adult Health | Admitting: Adult Health

## 2022-04-22 ENCOUNTER — Telehealth: Payer: Self-pay | Admitting: Adult Health

## 2022-04-22 DIAGNOSIS — N6320 Unspecified lump in the left breast, unspecified quadrant: Secondary | ICD-10-CM

## 2022-04-22 DIAGNOSIS — N6489 Other specified disorders of breast: Secondary | ICD-10-CM | POA: Insufficient documentation

## 2022-04-22 NOTE — Telephone Encounter (Signed)
I called the patient and her mailbox was full and I was not able to make her an appointment.  She will need a follow up with Dr. Shearon Stalls first available to go over recent CT scan.

## 2022-04-22 NOTE — Telephone Encounter (Signed)
-----   Message from Melvenia Needles, NP sent at 04/19/2022 12:48 PM EDT ----- Can you set her up with Dr. Shearon Stalls to discuss lung nodule  tp ----- Message ----- From: Spero Geralds, MD Sent: 04/17/2022   3:23 PM EDT To: Melvenia Needles, NP  Yes that's fine. I'm happy to talk with her.   ----- Message ----- From: Melvenia Needles, NP Sent: 04/12/2022   3:08 PM EDT To: Spero Geralds, MD  Hey there , This lady is complicated with Mod/Severe COPD , O2 RF , socioeconomic limitations, polysubstance issues . Enlarging nodule / PET positive (No mets ) , incidental probable parotid tumor.  Do not think she would be a surgical candidate/resection  with O2 at 4l/m . Do you want to bring her in to discuss options for tissue bx/Dx ?   Thanks  [place]

## 2022-04-23 NOTE — Telephone Encounter (Signed)
I've talked with [name] - ok to follow up with her about this.

## 2022-04-23 NOTE — Telephone Encounter (Signed)
ATC, she did not answer. Will attempt again later.

## 2022-04-23 NOTE — Telephone Encounter (Signed)
Dr. Shearon Stalls, you do not have any available appointments for the rest of this month nor next month. Please advise.

## 2022-04-26 ENCOUNTER — Ambulatory Visit (HOSPITAL_COMMUNITY)
Admission: RE | Admit: 2022-04-26 | Discharge: 2022-04-26 | Disposition: A | Discharge status: Home / Self Care | Payer: Medicare Other | Source: Ambulatory Visit | Attending: Student | Admitting: Student

## 2022-04-26 DIAGNOSIS — I255 Ischemic cardiomyopathy: Secondary | ICD-10-CM | POA: Insufficient documentation

## 2022-04-26 LAB — ECHOCARDIOGRAM LIMITED
Calc EF: 63 %
S' Lateral: 2.6 cm
Single Plane A2C EF: 69 %
Single Plane A4C EF: 56.2 %

## 2022-04-26 NOTE — Progress Notes (Signed)
Patient scheduled to f/u with Rexene Edison NP on 05/31/2022 as no available appointments with Dr. Shearon Stalls.  Nothing further needed.

## 2022-04-26 NOTE — Progress Notes (Signed)
Called and spoke with patient, advised of results/recommendations per Tammy Parrett NP.  She verbalized understanding.  Nothing further needed.

## 2022-04-26 NOTE — Progress Notes (Signed)
*  PRELIMINARY RESULTS* Echocardiogram Limited 2-D Echocardiogram  has been performed.  [name] 04/26/2022, 11:50 AM

## 2022-04-28 ENCOUNTER — Telehealth: Payer: Self-pay | Admitting: *Deleted

## 2022-04-28 NOTE — Telephone Encounter (Signed)
Called and spoke with patient. She stated that she still has some chest congestion. She is not able to cough up any phlegm. She also noticed a slight increase in wheezing and SOB. Denied any fevers or body aches.   She has completed the Levaquin that was sent in for her. She confirmed that she is taking prednisone 5mg  once daily and Breztri twice daily.   She wanted to know if TP would be willing to send in another abx for her.   Pharmacy is [facility].   TP, can you please advise? Thanks!

## 2022-04-28 NOTE — Telephone Encounter (Signed)
Patient would like to see if she can get some antibiotics. States she is still congested and would like to see if more antibiotics would help.  Patient uses [facility] and would like to be called when there is a prescription in. [phone]

## 2022-04-29 MED ORDER — PREDNISONE 10 MG PO TABS: 10.0000 mg | ORAL_TABLET | Freq: Every day | ORAL | 0 refills | Status: DC

## 2022-04-29 MED ORDER — DOXYCYCLINE HYCLATE 100 MG PO TABS: 100.0000 mg | ORAL_TABLET | Freq: Two times a day (BID) | ORAL | 0 refills | Status: DC

## 2022-04-29 NOTE — Telephone Encounter (Signed)
Begin Doxycycline 100mg  Twice daily  for 1 week , take with food #14  Increase Prednisone 10mg  daily for 1 week and then 5mg  daily  Keep ov next week  Please contact office for sooner follow up if symptoms do not improve or worsen or seek emergency care

## 2022-04-29 NOTE — Telephone Encounter (Signed)
Called and spoke with patient. She verbalized understanding. RX has been sent to her pharmacy.   Nothing further needed at time of call.

## 2022-05-05 ENCOUNTER — Ambulatory Visit: Payer: Medicare Other | Admitting: Adult Health

## 2022-05-06 ENCOUNTER — Telehealth: Payer: Self-pay | Admitting: *Deleted

## 2022-05-06 NOTE — Telephone Encounter (Signed)
ATC patient x1.  LVM to return call to schedule sooner f/u with provider to review PET results.  She no showed for her visit with TP yesterday.

## 2022-05-06 NOTE — Progress Notes (Signed)
ATC x1.  LVM to return call to reschedule OV to review PET results.

## 2022-05-10 ENCOUNTER — Other Ambulatory Visit: Payer: Self-pay | Admitting: Internal Medicine

## 2022-05-10 NOTE — Progress Notes (Signed)
Schedule for OV on 05/31/22.  Nothing further needed.

## 2022-05-14 ENCOUNTER — Ambulatory Visit: Payer: Medicare Other | Admitting: Podiatrist

## 2022-05-18 ENCOUNTER — Other Ambulatory Visit: Payer: Self-pay | Admitting: Adult Health

## 2022-05-19 ENCOUNTER — Telehealth: Payer: Self-pay | Admitting: Adult Health

## 2022-05-19 NOTE — Telephone Encounter (Signed)
Patient called to request a refill for patient's combivent medication.  She stated she is almost all out and would like it sent to [facility].  Please advise.

## 2022-05-19 NOTE — Telephone Encounter (Signed)
Refill of pt's combivent inhaler has been sent to preferred pharmacy for pt. Called and spoke with pt letting her know that this had been done and she verbalized understanding. Nothing further needed.

## 2022-05-24 ENCOUNTER — Telehealth: Payer: Self-pay | Admitting: Adult Health

## 2022-05-24 ENCOUNTER — Other Ambulatory Visit: Payer: Self-pay | Admitting: Internal Medicine

## 2022-05-24 NOTE — Telephone Encounter (Signed)
Patient returning call.

## 2022-05-24 NOTE — Telephone Encounter (Signed)
ATC patient but voicemail box is not set up will try again

## 2022-05-24 NOTE — Telephone Encounter (Signed)
Rx for pt's albuterol inhaler has been sent to pharmacy for pt. Attempted to call pt but unable to reach. Unable to leave VM as mailbox has not been set up yet. Will try to call again later.

## 2022-05-25 ENCOUNTER — Ambulatory Visit (INDEPENDENT_AMBULATORY_CARE_PROVIDER_SITE_OTHER): Payer: Medicare Other | Admitting: Nurse Practitioner

## 2022-05-25 ENCOUNTER — Encounter: Payer: Self-pay | Admitting: Nurse Practitioner

## 2022-05-25 VITALS — BP 110/69 | HR 84 | Ht 64.0 in | Wt 209.8 lb

## 2022-05-25 DIAGNOSIS — E119 Type 2 diabetes mellitus without complications: Secondary | ICD-10-CM

## 2022-05-25 DIAGNOSIS — I10 Essential (primary) hypertension: Secondary | ICD-10-CM | POA: Diagnosis not present

## 2022-05-25 LAB — POCT GLYCOSYLATED HEMOGLOBIN (HGB A1C): Hemoglobin A1C: 7 % — AB (ref 4.0–5.6)

## 2022-05-25 MED ORDER — ACCU-CHEK GUIDE VI STRP: ORAL_STRIP | 12 refills | Status: DC

## 2022-05-25 MED ORDER — ACCU-CHEK SOFTCLIX LANCETS MISC: 12 refills | Status: DC

## 2022-05-25 MED ORDER — METFORMIN HCL 1000 MG PO TABS: 1000.0000 mg | ORAL_TABLET | Freq: Two times a day (BID) | ORAL | 3 refills | Status: DC

## 2022-05-25 NOTE — Patient Instructions (Signed)

## 2022-05-25 NOTE — Progress Notes (Signed)
Endocrinology Consult Note       05/25/2022, 11:00 AM   Subjective:    Patient ID: [name], female    DOB: February 26, 1956.  [name] is being seen in consultation for management of currently uncontrolled symptomatic diabetes requested by  Nsumanganyi, Ferdinand Lango, NP.   Past Medical History:  Diagnosis Date   Anemia    Anxiety    Arthritis    Bipolar 1 disorder (HCC)    CHF (congestive heart failure) (HCC)    COPD (chronic obstructive pulmonary disease) (HCC)    Depression    Diabetes mellitus without complication (HCC)    Dyspnea    Emphysema of lung (HCC)    GERD (gastroesophageal reflux disease)    Headache    migraines   History of hiatal hernia    History of kidney stones    Hyperlipidemia    Hypertension    Neuromuscular disorder (HCC)    neuropathy   On home oxygen therapy    Pneumonia 2015, 2019   Tracheomalacia    Vaginal Pap smear, abnormal     Past Surgical History:  Procedure Laterality Date   CATARACT EXTRACTION W/PHACO Left 01/14/2020   Procedure: CATARACT EXTRACTION PHACO AND INTRAOCULAR LENS PLACEMENT LEFT EYE;  Surgeon: Baruch Goldmann, MD;  Location: [facility];  Service: Ophthalmology;  Laterality: Left;  CDE: 8.18   CATARACT EXTRACTION W/PHACO Right 02/01/2020   Procedure: CATARACT EXTRACTION PHACO AND INTRAOCULAR LENS PLACEMENT RIGHT EYE;  Surgeon: Baruch Goldmann, MD;  Location: [facility];  Service: Ophthalmology;  Laterality: Right;  CDE: 9.94   CHEILECTOMY Right 01/28/2020   Procedure: KELLER BUNION IMPLANT RIGHT FOOT CHEILECTOMY RIGHT ROOT;  Surgeon: Landis Martins, DPM;  Location: [facility];  Service: Podiatry;  Laterality: Right;  BLOCK   CHOLECYSTECTOMY     COLONOSCOPY  July 2010   Dr. Arnoldo Morale: 3 rectal polyps, not enough tissue for pathologic examination, recommended surveillance in 3 years   COLONOSCOPY N/A 10/23/2014   RMR: Multiple colonic polyps removed  as described above. No endoscopic explaniation for abdominal pain. however. next tcs 10/2019   ESOPHAGOGASTRODUODENOSCOPY  July 2010   Dr. Arnoldo Morale: gastritis and duodenitis, H.pylori negative   ESOPHAGOGASTRODUODENOSCOPY N/A 10/23/2014   RMR: Normal EGD. Status post passage of a Maloney dilator. Today's finding s would not explain abdominal pain   EYE SURGERY Left 01/14/2020   cataract removal   GANGLION CYST EXCISION Left 09/07/2018   Procedure: REMOVAL GANGLION OF WRIST;  Surgeon: Carole Civil, MD;  Location: [facility];  Service: Orthopedics;  Laterality: Left;   HERNIA REPAIR     Dr. Arnoldo Morale   KIDNEY STONE SURGERY     MALONEY DILATION N/A 10/23/2014   Procedure: Venia Minks DILATION;  Surgeon: Daneil Dolin, MD;  [facility];  Service: Endoscopy;  Laterality: N/A;   OPEN REDUCTION INTERNAL FIXATION (ORIF) DISTAL RADIAL FRACTURE Right 12/09/2015   Procedure: OPEN REDUCTION INTERNAL FIXATION (ORIF) RIGHT DISTAL RADIUS;  Surgeon: Leanora Cover, MD;  Location: [facility];  Service: Orthopedics;  Laterality: Right;    Social History   Socioeconomic History   Marital status: Married    Spouse name: Not on file  Number of children: Not on file   Years of education: Not on file   Highest education level: Not on file  Occupational History   Occupation: disability  Tobacco Use   Smoking status: Every Day    Packs/day: 2.00    Years: 52.00    Total pack years: 104.00    Types: Cigarettes    Start date: 1969   Smokeless tobacco: Never   Tobacco comments:    Smoking a pack in 24 hours.  02/19/22 hfb  Vaping Use   Vaping Use: Never used  Substance and Sexual Activity   Alcohol use: Yes    Alcohol/week: 1.0 standard drink of alcohol    Types: 1 Standard drinks or equivalent per week    Comment: alcohol abuse   Drug use: Yes    Types: Cocaine   Sexual activity: Not Currently    Birth control/protection: Post-menopausal  Other Topics Concern   Not on file   Social History Narrative   Not on file   Social Determinants of Health   Financial Resource Strain: High Risk (12/31/2020)   Overall Financial Resource Strain (CARDIA)    Difficulty of Paying Living Expenses: Hard  Food Insecurity: No Food Insecurity (12/31/2020)   Hunger Vital Sign    Worried About Running Out of Food in the Last Year: Never true    Ran Out of Food in the Last Year: Never true  Transportation Needs: No Transportation Needs (12/31/2020)   PRAPARE - Hydrologist (Medical): No    Lack of Transportation (Non-Medical): No  Physical Activity: Insufficiently Active (12/31/2020)   Exercise Vital Sign    Days of Exercise per Week: 2 days    Minutes of Exercise per Session: 10 min  Stress: Stress Concern Present (12/31/2020)   [place]    Feeling of Stress : Very much  Social Connections: Moderately Isolated (12/31/2020)   Social Connection and Isolation Panel [NHANES]    Frequency of Communication with Friends and Family: Three times a week    Frequency of Social Gatherings with Friends and Family: Once a week    Attends Religious Services: Never    Marine scientist or Organizations: No    Attends Music therapist: Never    Marital Status: Married    Family History  Adopted: Yes    Outpatient Encounter Medications as of 05/25/2022  Medication Sig   Accu-Chek Softclix Lancets lancets Use as instructed to monitor glucose twice daily   acetaminophen (TYLENOL) 500 MG tablet Take 1 tablet (500 mg total) by mouth every 6 (six) hours as needed. (Patient taking differently: Take 1,000 mg by mouth every 6 (six) hours as needed.)   albuterol (PROVENTIL) (2.5 MG/3ML) 0.083% nebulizer solution INHALE 1 VIAL VIA NEBULIZER EVERY 4 HOURS   albuterol (VENTOLIN HFA) 108 (90 Base) MCG/ACT inhaler INHALE 1 OR 2 PUFFS BY MOUTH EVERY 6 HOURS AS NEEDED FOR WHEEZING OR SHORTNESS  OF BREATH   aspirin EC 81 MG tablet Take 1 tablet (81 mg total) by mouth daily. Swallow whole.   Budeson-Glycopyrrol-Formoterol (BREZTRI AEROSPHERE) 160-9-4.8 MCG/ACT AERO Inhale 2 puffs into the lungs 2 (two) times daily.   cetirizine (ZYRTEC) 10 MG tablet Take 10 mg by mouth daily as needed for allergies.    clopidogrel (PLAVIX) 75 MG tablet Take 1 tablet (75 mg total) by mouth daily.   clotrimazole (MYCELEX) 10 MG troche Take 1 tablet (10 mg total)  by mouth 5 (five) times daily.   cycloSPORINE (RESTASIS) 0.05 % ophthalmic emulsion Place 1 drop into both eyes 2 (two) times daily as needed (dry eyes).   DULoxetine (CYMBALTA) 20 MG capsule Take 20 mg by mouth daily.   gabapentin (NEURONTIN) 800 MG tablet Take 1 tablet (800 mg total) by mouth 3 (three) times daily as needed.   glucose blood (ACCU-CHEK GUIDE) test strip Use as instructed to monitor glucose twice daily   ipratropium (ATROVENT) 0.02 % nebulizer solution Take 2.5 mLs by nebulization every 4 (four) hours as needed for wheezing or shortness of breath.   Ipratropium-Albuterol (COMBIVENT RESPIMAT) 20-100 MCG/ACT AERS respimat INHALE 1 PUFF INTO THE LUNGS EVERY (4) HOURS.   nitroGLYCERIN (NITROSTAT) 0.4 MG SL tablet Place under the tongue.   olmesartan-hydrochlorothiazide (BENICAR HCT) 20-12.5 MG tablet Take 1 tablet by mouth daily.   ondansetron (ZOFRAN-ODT) 4 MG disintegrating tablet Take 4 mg by mouth 2 (two) times daily as needed for nausea/vomiting.   pantoprazole (PROTONIX) 40 MG tablet Take 40 mg by mouth daily.   rOPINIRole (REQUIP) 1 MG tablet Take 1 mg by mouth 3 (three) times daily.    rosuvastatin (CRESTOR) 10 MG tablet Take 1 tablet (10 mg total) by mouth daily.   SUMAtriptan (IMITREX) 25 MG tablet Take 25 mg by mouth every 2 (two) hours as needed for migraine.   torsemide (DEMADEX) 20 MG tablet Take 1 tablet (20 mg total) by mouth daily as needed. May take an additional 20 mg daily as needed for extreme swelling.    [DISCONTINUED] metFORMIN (GLUCOPHAGE) 1000 MG tablet Take 1,000 mg by mouth 2 (two) times daily.   metFORMIN (GLUCOPHAGE) 1000 MG tablet Take 1 tablet (1,000 mg total) by mouth 2 (two) times daily.   thiamine 100 MG tablet Take 1 tablet (100 mg total) by mouth daily. (Patient not taking: Reported on 05/25/2022)   [DISCONTINUED] doxycycline (VIBRA-TABS) 100 MG tablet Take 1 tablet (100 mg total) by mouth 2 (two) times daily. (Patient not taking: Reported on 05/25/2022)   [DISCONTINUED] folic acid (FOLVITE) 1 MG tablet Take 1 tablet (1 mg total) by mouth daily. (Patient not taking: Reported on 05/25/2022)   [DISCONTINUED] hydrOXYzine (VISTARIL) 50 MG capsule TAKE 1 CAPSULE BY MOUTH THREE TIMES DAILY AS NEEDED FOR ANXIETY (Patient not taking: Reported on 05/25/2022)   [DISCONTINUED] predniSONE (DELTASONE) 10 MG tablet Take 1 tablet (10 mg total) by mouth daily with breakfast. (Patient not taking: Reported on 05/25/2022)   [DISCONTINUED] predniSONE (DELTASONE) 10 MG tablet Take 1 tablet (10 mg total) by mouth daily with breakfast. (Patient not taking: Reported on 05/25/2022)   [DISCONTINUED] predniSONE (DELTASONE) 5 MG tablet Take 1 tablet (5 mg total) by mouth daily with breakfast. (Patient not taking: Reported on 05/25/2022)   No facility-administered encounter medications on file as of 05/25/2022.    ALLERGIES: Allergies  Allergen Reactions   Penicillins Other (See Comments)    Patient is unsure if she allergic to penicillin or septra. Patient states one or another caused "rib pain with a little breathing problem". Has patient had a PCN reaction causing immediate rash, facial/tongue/throat swelling, SOB or lightheadedness with hypotension: YES Has patient had a PCN reaction causing severe rash involving mucus membranes or skin necrosis: NO Has patient had a PCN reaction that required hospitalization: NO Has patient had a PCN reaction occurring within the last 10 years: NO   Septra  [Sulfamethoxazole-Trimethoprim] Other (See Comments)    Patient is unsure if she allergic to septra or  penicillin. Patient states one or another caused "rib pain with a little breathing problem".    VACCINATION STATUS: Immunization History  Administered Date(s) Administered   Influenza Whole 03/29/2019   Influenza-Unspecified 03/06/2019, 04/13/2021   Moderna Sars-Covid-2 Vaccination 02/22/2020, 04/18/2020   Pneumococcal Polysaccharide-23 07/06/2014   Td (Adult),5 Lf Tetanus Toxid, Preservative Free 07/18/1996   Tdap 11/30/2013    Diabetes She presents for her initial diabetic visit. She has type 2 diabetes mellitus. Onset time: Diagnosed at approx age of 47. Her disease course has been stable. There are no hypoglycemic associated symptoms. Associated symptoms include fatigue and polyuria. There are no hypoglycemic complications. Symptoms are stable. Diabetic complications include heart disease (MI back in June). Risk factors for coronary artery disease include diabetes mellitus, dyslipidemia, family history, hypertension, post-menopausal, sedentary lifestyle and tobacco exposure. Current diabetic treatment includes oral agent (monotherapy). She is compliant with treatment most of the time. Her weight is fluctuating minimally. She is following a generally unhealthy diet. When asked about meal planning, she reported none. She has not had a previous visit with a dietitian. She rarely participates in exercise. (She presents today for her consultation, accompanied by her husband, with no meter or logs to review.  Her POCT A1c today is 7%, increasing from last A1c of 6.5%.  She is intermittently on steroids for COPD exacerbations which could be contributing to her readings.  She only monitors glucose once daily.  She drinks sodas mainly, eats 2 meals and a snack between.  She does not engage in routine physical activity, as she is on chronic oxygen and cannot tolerate it.  She is due for eye exam, has seen  podiatry in the past.) An ACE inhibitor/angiotensin II receptor blocker is being taken. She sees a podiatrist.Eye exam is not current.     Review of systems  Constitutional: + Minimally fluctuating body weight, current Body mass index is 36.01 kg/m., + fatigue, no subjective hyperthermia, no subjective hypothermia Eyes: no blurry vision, no xerophthalmia ENT: no sore throat, no nodules palpated in throat, no dysphagia/odynophagia, no hoarseness Cardiovascular: no chest pain, no shortness of breath, no palpitations, no leg swelling Respiratory: + chronic cough (COPD), SOB with exertion on chronic O2 via [place]  Gastrointestinal: no nausea/vomiting/diarrhea Musculoskeletal: no muscle/joint aches Skin: no rashes, no hyperemia Neurological: no tremors, no numbness, no tingling, + intermittent dizziness Psychiatric: no depression, no anxiety  Objective:     BP 110/69 (BP Location: Left Arm, Patient Position: Sitting, Cuff Size: Large)   Pulse 84   Ht 5\' 4"  (1.626 m)   Wt 209 lb 12.8 oz (95.2 kg)   BMI 36.01 kg/m   Wt Readings from Last 3 Encounters:  05/25/22 209 lb 12.8 oz (95.2 kg)  04/20/22 205 lb 6.4 oz (93.2 kg)  03/30/22 204 lb 3.2 oz (92.6 kg)     BP Readings from Last 3 Encounters:  05/25/22 110/69  04/20/22 138/62  03/30/22 110/60     Physical Exam- Limited  Constitutional:  Body mass index is 36.01 kg/m. , not in acute distress, normal state of mind Eyes:  EOMI, no exophthalmos Neck: Supple Cardiovascular: RRR, no murmurs, rubs, or gallops, no edema Respiratory: Dyspnea with exertion,  ++ rhonchi, ++ wheezing Musculoskeletal: no gross deformities, strength intact in all four extremities, no gross restriction of joint movements Skin:  no rashes, no hyperemia Neurological: no tremor with outstretched hands    CMP ( most recent) CMP     Component Value Date/Time   NA 131 (L)  03/30/2022 1121   NA 134 02/23/2022 1113   K 4.3 03/30/2022 1121   CL 89 (L)  03/30/2022 1121   CO2 36 (H) 03/30/2022 1121   GLUCOSE 199 (H) 03/30/2022 1121   BUN 16 03/30/2022 1121   BUN 12 02/23/2022 1113   CREATININE 0.82 03/30/2022 1121   CREATININE 0.57 11/20/2014 1311   CALCIUM 11.4 (H) 03/30/2022 1121   PROT 7.5 01/02/2022 0451   PROT 6.6 07/18/2015 1113   ALBUMIN 4.0 01/02/2022 0451   ALBUMIN 4.1 07/18/2015 1113   AST 32 01/02/2022 0451   ALT 27 01/02/2022 0451   ALKPHOS 75 01/02/2022 0451   BILITOT 0.3 01/02/2022 0451   BILITOT 0.4 07/18/2015 1113   GFRNONAA >60 01/06/2022 0118   GFRAA >60 01/23/2020 1119     Diabetic Labs (most recent): Lab Results  Component Value Date   HGBA1C 7.0 (A) 05/25/2022   HGBA1C 6.5 (H) 01/02/2022   HGBA1C 6.3 (H) 04/04/2021     Lipid Panel ( most recent) Lipid Panel     Component Value Date/Time   CHOL 197 07/18/2015 1113   TRIG 144 07/18/2015 1113   HDL 41 07/18/2015 1113   CHOLHDL 4.8 (H) 07/18/2015 1113   CHOLHDL 3.1 03/07/2014 0343   VLDL 40 03/07/2014 0343   LDLCALC 127 (H) 07/18/2015 1113   LABVLDL 29 07/18/2015 1113      Lab Results  Component Value Date   TSH 1.380 07/18/2015   TSH 0.432 07/06/2014   TSH 0.754 02/05/2010           Assessment & Plan:   1) Type 2 diabetes mellitus without complication, without long-term current use of insulin ([place])  She presents today for her consultation, accompanied by her husband, with no meter or logs to review.  Her POCT A1c today is 7%, increasing from last A1c of 6.5%.  She is intermittently on steroids for COPD exacerbations which could be contributing to her readings.  She only monitors glucose once daily.  She drinks sodas mainly, eats 2 meals and a snack between.  She does not engage in routine physical activity, as she is on chronic oxygen and cannot tolerate it.  She is due for eye exam, has seen podiatry in the past.  - SHAELEY SEGALL has currently uncontrolled symptomatic type 2 DM since 66 years of age, with most recent A1c of 7 %.    -Recent labs reviewed.  - I had a long discussion with her about the progressive nature of diabetes and the pathology behind its complications. -her diabetes is complicated by CAD with recent MI and she remains at a high risk for more acute and chronic complications which include CAD, CVA, CKD, retinopathy, and neuropathy. These are all discussed in detail with her.  The following Lifestyle Medicine recommendations according to [facility]) were discussed and offered to patient and she agrees to start the journey:  A. Whole Foods, Plant-based plate comprising of fruits and vegetables, plant-based proteins, whole-grain carbohydrates was discussed in detail with the patient.   A list for source of those nutrients were also provided to the patient.  Patient will use only water or unsweetened tea for hydration. B.  The need to stay away from risky substances including alcohol, smoking; obtaining 7 to 9 hours of restorative sleep, at least 150 minutes of moderate intensity exercise weekly, the importance of healthy social connections,  and stress reduction techniques were discussed. C.  A full color page of  Calorie density of various food groups per pound showing examples of each food groups was provided to the patient.  - I have counseled her on diet and weight management by adopting a carbohydrate restricted/protein rich diet. Patient is encouraged to switch to unprocessed or minimally processed complex starch and increased protein intake (animal or plant source), fruits, and vegetables. -  she is advised to stick to a routine mealtimes to eat 3 meals a day and avoid unnecessary snacks (to snack only to correct hypoglycemia).   - she acknowledges that there is a room for improvement in her food and drink choices. - Suggestion is made for her to avoid simple carbohydrates from her diet including Cakes, Sweet Desserts, Ice Cream, Soda (diet and regular), Sweet Tea,  Candies, Chips, Cookies, Store Bought Juices, Alcohol in Excess of 1-2 drinks a day, Artificial Sweeteners, Coffee Creamer, and "Sugar-free" Products. This will help patient to have more stable blood glucose profile and potentially avoid unintended weight gain.  - I have approached her with the following individualized plan to manage her diabetes and patient agrees:   -she is encouraged to start monitoring glucose twice daily, before breakfast and before bed, to log their readings on the clinic sheets provided, and bring them to review at follow up appointment in 4 weeks.  - Adjustment parameters are given to her for hypo and hyperglycemia in writing. - she is encouraged to call clinic for blood glucose levels less than 70 or above 300 mg /dl. - she is advised to continue Metformin 1000 mg po twice daily after meals, therapeutically suitable for patient .  Her main goal in her diabetes management would be to be able to come off her medications and manage with lifestyle changes.  - Specific targets for  A1c; LDL, HDL, and Triglycerides were discussed with the patient.  2) Blood Pressure /Hypertension:  her blood pressure is controlled to target.   she is advised to continue her current medications including Torsemide 20 mg p.o. daily with breakfast and Benicar HCT 20-12.5 mg po daily.  3) Lipids/Hyperlipidemia:    There is no recent lipid panel available to review.  she is advised to continue Crestor 10 mg daily at bedtime.  Side effects and precautions discussed with her.  4)  Weight/Diet:  her Body mass index is 36.01 kg/m.  -  clearly complicating her diabetes care.   she is a candidate for weight loss. I discussed with her the fact that loss of 5 - 10% of her  current body weight will have the most impact on her diabetes management.  Exercise, and detailed carbohydrates information provided  -  detailed on discharge instructions.  5) Chronic Care/Health Maintenance: -she is on ACEI/ARB and  Statin medications and is encouraged to initiate and continue to follow up with Ophthalmology, Dentist, Podiatrist at least yearly or according to recommendations, and advised to stay away from smoking. I have recommended yearly flu vaccine and pneumonia vaccine at least every 5 years; moderate intensity exercise for up to 150 minutes weekly; and sleep for at least 7 hours a day.  - she is advised to maintain close follow up with Nsumanganyi, Ferdinand Lango, NP for primary care needs, as well as her other providers for optimal and coordinated care.   - Time spent in this patient care: 60 min, of which > 50% was spent in counseling her about her diabetes and the rest reviewing her blood glucose logs, discussing her hypoglycemia and hyperglycemia episodes, reviewing her  current and previous labs/studies (including abstraction from other facilities) and medications doses and developing a long term treatment plan based on the latest standards of care/guidelines; and documenting her care.    Please refer to Patient Instructions for Blood Glucose Monitoring and Insulin/Medications Dosing Guide" in media tab for additional information. Please also refer to "Patient Self Inventory" in the Media tab for reviewed elements of pertinent patient history.  Joesph Fillers participated in the discussions, expressed understanding, and voiced agreement with the above plans.  All questions were answered to her satisfaction. she is encouraged to contact clinic should she have any questions or concerns prior to her return visit.     Follow up plan: - Return in about 1 month (around 06/24/2022) for Diabetes F/U, Bring meter and logs.    Rayetta Pigg, [facility] [address] [place], [place] [address] Phone: [phone] Fax: [phone]  05/25/2022, 11:00 AM

## 2022-05-27 ENCOUNTER — Other Ambulatory Visit: Payer: Self-pay | Admitting: Internal Medicine

## 2022-05-28 ENCOUNTER — Other Ambulatory Visit: Payer: Self-pay | Admitting: Adult Health

## 2022-05-31 ENCOUNTER — Ambulatory Visit (INDEPENDENT_AMBULATORY_CARE_PROVIDER_SITE_OTHER): Payer: Medicare Other | Admitting: Adult Health

## 2022-05-31 ENCOUNTER — Telehealth: Payer: Self-pay | Admitting: *Deleted

## 2022-05-31 ENCOUNTER — Encounter: Payer: Self-pay | Admitting: Adult Health

## 2022-05-31 VITALS — BP 112/60 | HR 83 | Temp 97.7°F | Ht 64.0 in | Wt 211.4 lb

## 2022-05-31 DIAGNOSIS — J9611 Chronic respiratory failure with hypoxia: Secondary | ICD-10-CM

## 2022-05-31 DIAGNOSIS — R918 Other nonspecific abnormal finding of lung field: Secondary | ICD-10-CM

## 2022-05-31 DIAGNOSIS — F17200 Nicotine dependence, unspecified, uncomplicated: Secondary | ICD-10-CM

## 2022-05-31 DIAGNOSIS — C3412 Malignant neoplasm of upper lobe, left bronchus or lung: Secondary | ICD-10-CM | POA: Insufficient documentation

## 2022-05-31 DIAGNOSIS — K118 Other diseases of salivary glands: Secondary | ICD-10-CM

## 2022-05-31 DIAGNOSIS — R911 Solitary pulmonary nodule: Secondary | ICD-10-CM

## 2022-05-31 DIAGNOSIS — J441 Chronic obstructive pulmonary disease with (acute) exacerbation: Secondary | ICD-10-CM

## 2022-05-31 MED ORDER — BENZONATATE 200 MG PO CAPS: 200.0000 mg | ORAL_CAPSULE | Freq: Three times a day (TID) | ORAL | 1 refills | Status: DC | PRN

## 2022-05-31 MED ORDER — PREDNISONE 20 MG PO TABS: 20.0000 mg | ORAL_TABLET | Freq: Every day | ORAL | 0 refills | Status: DC

## 2022-05-31 MED ORDER — DOXYCYCLINE HYCLATE 100 MG PO TABS: 100.0000 mg | ORAL_TABLET | Freq: Two times a day (BID) | ORAL | 0 refills | Status: DC

## 2022-05-31 NOTE — Assessment & Plan Note (Signed)
Parotid nodule hypermetabolic on PET scan.  Refer to ENT for further evaluation

## 2022-05-31 NOTE — Assessment & Plan Note (Signed)
Cavitary lung mass in the left upper lobe measuring 2.7 cm, hypermetabolic on PET scan suspicious for underlying malignancy.  Unfortunately patient has underlying severe COPD with significant oxygen demands at 4 to 5 L/min.  She is high risk for tissue sampling. We discussed her CT and PET scans in detail with Dr. Shearon Stalls.  Patient is not a candidate for tissue sampling, resection due to underlying COPD and oxygen demands. For now we will repeat her CT chest in 3 months.  If continue to have ongoing enlargement could consider referral to radiation oncology for possible targeted SBRT.  Patient be set up for CT chest early January.  Patient would like to have this done at the [place] office and transfer care to that office as it is much closer to her home.  And she has transportation issues.  Plan  Patient Instructions  CT chest in 6 weeks (follow up lung nodule )  Doxycycline 100mg  Twice daily  for 7 days , take with food.  Prednisone 20mg  daily for 5 days , take with food.  Mucinex DM Twice daily  As needed  cough/congestion  Tessalon Three times a day  As needed  cough .  Continue on Breztri 2 puffs twice daily, rinse after use Albuterol inhaler or nebulizer as needed Continue on oxygen 4 to 5 L to maintain O2 saturations greater than 88 to 90% Work on not smoking .  Refer to ENT .  Follow up in 2 months in [facility] and As needed   Please contact office for sooner follow up if symptoms do not improve or worsen or seek emergency care

## 2022-05-31 NOTE — Assessment & Plan Note (Signed)
Smoking cessation  

## 2022-05-31 NOTE — Telephone Encounter (Signed)
Dr. Melvyn Novas, patient is requesting to change to Dr. Elsworth Soho in [place].  Please advise if ok.  Thank you.

## 2022-05-31 NOTE — Patient Instructions (Addendum)
CT chest in 6 weeks (follow up lung nodule )  Doxycycline 100mg  Twice daily  for 7 days , take with food.  Prednisone 20mg  daily for 5 days , take with food.  Mucinex DM Twice daily  As needed  cough/congestion  Tessalon Three times a day  As needed  cough .  Continue on Breztri 2 puffs twice daily, rinse after use Albuterol inhaler or nebulizer as needed Continue on oxygen 4 to 5 L to maintain O2 saturations greater than 88 to 90% Work on not smoking .  Refer to ENT .  Follow up in 2 months in [facility] and As needed   Please contact office for sooner follow up if symptoms do not improve or worsen or seek emergency care

## 2022-05-31 NOTE — Assessment & Plan Note (Signed)
Acute COPD exacerbation.-Encouraged on smoking cessation.  We will treat with empiric antibiotics and short burst of steroids  Plan  Patient Instructions  CT chest in 6 weeks (follow up lung nodule )  Doxycycline 100mg  Twice daily  for 7 days , take with food.  Prednisone 20mg  daily for 5 days , take with food.  Mucinex DM Twice daily  As needed  cough/congestion  Tessalon Three times a day  As needed  cough .  Continue on Breztri 2 puffs twice daily, rinse after use Albuterol inhaler or nebulizer as needed Continue on oxygen 4 to 5 L to maintain O2 saturations greater than 88 to 90% Work on not smoking .  Refer to ENT .  Follow up in 2 months in [facility] and As needed   Please contact office for sooner follow up if symptoms do not improve or worsen or seek emergency care

## 2022-05-31 NOTE — Telephone Encounter (Signed)
Dr.Alva please advise if you are ok with this. Thanks!

## 2022-05-31 NOTE — Assessment & Plan Note (Signed)
Continue on oxygen to maintain O2 saturations greater than 88 to 90%. 

## 2022-05-31 NOTE — Progress Notes (Signed)
@Patient  ID: [name], female    DOB: 06/30/1956, 66 y.o.   MRN: [id number]  Chief Complaint  Patient presents with   Follow-up    Referring provider: No ref. provider found  HPI: 66 year old female active smoker followed for severe COPD and oxygen dependent respiratory failure with baseline oxygen at 4 to 5 L/min.  Has been on oxygen since 2019 Medical history significant for bipolar disorder and polysubstance abuse  TEST/EVENTS :  PFTs July 29, 2020 FEV1 50%, ratio 77, FVC 49%, no significant bronchodilator response, DLCO 50%     05/31/2022 Follow up : COPD , O2 RF , Abnormal CT chest /Lung mass  Patient returns for a 4-month follow-up.  Patient has underlying COPD with oxygen dependent respiratory failure.  She remains on oxygen 4 to 5 L.  Patient complains over the last week she has had increased cough congestion and intermittent wheezing.  Says mucus is very thick and yellow.  Hard to get up at times.  She remains on Breztri inhaler twice daily. She denies any hemoptysis, chest pain, orthopnea.  Last visit CT chest showed a cavitary solid 2.7 cm apical left upper lobe pulmonary nodule increased in size.  Suspicious for underlying carcinoma.  Subsequent PET scan on April 08, 2022 enlarging cavitary lesion in left apex with mild hypermetabolic activity suspicious for adenocarcinoma.  No evidence of metastatic disease.  Small hypermetabolic nodule in the left parotid lobe. We discussed her scan reports in detail.  Case has been discussed in detail with Dr. Shearon Stalls .  Patient is a high risk for biopsy due to underlying severe COPD and high oxygen requirements.   We went over current options for serial follow-up and monitoring.  We will repeat CT chest in early January.  If area continues to slowly enlarge could discuss possible referral to radiation oncology for SBRT (without tissue diagnosis) We also discussed referral to ENT for parotid nodule. Patient has rescheduled  appointment several times transportation is a major issue for patient.  She does not drive and they do not have a car for transportation.   Allergies  Allergen Reactions   Penicillins Other (See Comments)    Patient is unsure if she allergic to penicillin or septra. Patient states one or another caused "rib pain with a little breathing problem". Has patient had a PCN reaction causing immediate rash, facial/tongue/throat swelling, SOB or lightheadedness with hypotension: YES Has patient had a PCN reaction causing severe rash involving mucus membranes or skin necrosis: NO Has patient had a PCN reaction that required hospitalization: NO Has patient had a PCN reaction occurring within the last 10 years: NO   Septra [Sulfamethoxazole-Trimethoprim] Other (See Comments)    Patient is unsure if she allergic to septra or penicillin. Patient states one or another caused "rib pain with a little breathing problem".    Immunization History  Administered Date(s) Administered   Fluad Quad(high Dose 65+) 04/30/2022   Influenza Whole 03/29/2019   Influenza-Unspecified 03/06/2019, 04/13/2021   Moderna Sars-Covid-2 Vaccination 02/22/2020, 04/18/2020   Pneumococcal Polysaccharide-23 07/06/2014   Td (Adult),5 Lf Tetanus Toxid, Preservative Free 07/18/1996   Tdap 11/30/2013    Past Medical History:  Diagnosis Date   Anemia    Anxiety    Arthritis    Bipolar 1 disorder (HCC)    CHF (congestive heart failure) ([place])    COPD (chronic obstructive pulmonary disease) ([place])    Depression    Diabetes mellitus without complication (HCC)    Dyspnea  Emphysema of lung (HCC)    GERD (gastroesophageal reflux disease)    Headache    migraines   History of hiatal hernia    History of kidney stones    Hyperlipidemia    Hypertension    Neuromuscular disorder (HCC)    neuropathy   On home oxygen therapy    Pneumonia 2015, 2019   Tracheomalacia    Vaginal Pap smear, abnormal     Tobacco History: Social  History   Tobacco Use  Smoking Status Every Day   Packs/day: 2.00   Years: 52.00   Total pack years: 104.00   Types: Cigarettes   Start date: 1969  Smokeless Tobacco Never  Tobacco Comments   Smoking 1/2 a pack in 24 hours.  05/31/2022 hfb   Ready to quit: No Counseling given: Yes Tobacco comments: Smoking 1/2 a pack in 24 hours.  05/31/2022 hfb   Outpatient Medications Prior to Visit  Medication Sig Dispense Refill   Accu-Chek Softclix Lancets lancets Use as instructed to monitor glucose twice daily 100 each 12   acetaminophen (TYLENOL) 500 MG tablet Take 1 tablet (500 mg total) by mouth every 6 (six) hours as needed. (Patient taking differently: Take 1,000 mg by mouth every 6 (six) hours as needed.) 30 tablet 0   albuterol (PROVENTIL) (2.5 MG/3ML) 0.083% nebulizer solution INHALE 1 VIAL VIA NEBULIZER EVERY 4 HOURS 360 mL 2   albuterol (VENTOLIN HFA) 108 (90 Base) MCG/ACT inhaler INHALE 1 OR 2 PUFFS BY MOUTH EVERY 6 HOURS AS NEEDED FOR WHEEZING OR SHORTNESS OF BREATH 8.5 g 5   aspirin EC 81 MG tablet Take 1 tablet (81 mg total) by mouth daily. Swallow whole. 30 tablet 2   Budeson-Glycopyrrol-Formoterol (BREZTRI AEROSPHERE) 160-9-4.8 MCG/ACT AERO Inhale 2 puffs into the lungs 2 (two) times daily. 10.7 g 1   cetirizine (ZYRTEC) 10 MG tablet Take 10 mg by mouth daily as needed for allergies.      clopidogrel (PLAVIX) 75 MG tablet Take 1 tablet (75 mg total) by mouth daily. 30 tablet 2   clotrimazole (MYCELEX) 10 MG troche Take 1 tablet (10 mg total) by mouth 5 (five) times daily. 35 Troche 0   cycloSPORINE (RESTASIS) 0.05 % ophthalmic emulsion Place 1 drop into both eyes 2 (two) times daily as needed (dry eyes).     DULoxetine (CYMBALTA) 20 MG capsule Take 20 mg by mouth daily.     gabapentin (NEURONTIN) 800 MG tablet Take 1 tablet (800 mg total) by mouth 3 (three) times daily as needed.     glucose blood (ACCU-CHEK GUIDE) test strip Use as instructed to monitor glucose twice daily 100  each 12   ipratropium (ATROVENT) 0.02 % nebulizer solution Take 2.5 mLs by nebulization every 4 (four) hours as needed for wheezing or shortness of breath.     Ipratropium-Albuterol (COMBIVENT RESPIMAT) 20-100 MCG/ACT AERS respimat INHALE 1 PUFF INTO THE LUNGS EVERY (4) HOURS. 4 g 5   metFORMIN (GLUCOPHAGE) 1000 MG tablet Take 1 tablet (1,000 mg total) by mouth 2 (two) times daily. 180 tablet 3   nitroGLYCERIN (NITROSTAT) 0.4 MG SL tablet Place under the tongue.     olmesartan-hydrochlorothiazide (BENICAR HCT) 20-12.5 MG tablet TAKE (1) TABLET BY MOUTH DAILY 30 tablet 0   ondansetron (ZOFRAN-ODT) 4 MG disintegrating tablet Take 4 mg by mouth 2 (two) times daily as needed for nausea/vomiting.     pantoprazole (PROTONIX) 40 MG tablet Take 40 mg by mouth daily.     rOPINIRole (REQUIP) 1  MG tablet Take 1 mg by mouth 3 (three) times daily.      rosuvastatin (CRESTOR) 10 MG tablet Take 1 tablet (10 mg total) by mouth daily. 90 tablet 3   SUMAtriptan (IMITREX) 25 MG tablet Take 25 mg by mouth every 2 (two) hours as needed for migraine.     thiamine 100 MG tablet Take 1 tablet (100 mg total) by mouth daily. 30 tablet 1   torsemide (DEMADEX) 20 MG tablet Take 1 tablet (20 mg total) by mouth daily as needed. May take an additional 20 mg daily as needed for extreme swelling. 30 tablet 0   No facility-administered medications prior to visit.     Review of Systems:   Constitutional:   No  weight loss, night sweats,  Fevers, chills,  +fatigue, or  lassitude.  HEENT:   No headaches,  Difficulty swallowing,  Tooth/dental problems, or  Sore throat,                No sneezing, itching, ear ache,  +nasal congestion, post nasal drip,   CV:  No chest pain,  Orthopnea, PND, swelling in lower extremities, anasarca, dizziness, palpitations, syncope.   GI  No heartburn, indigestion, abdominal pain, nausea, vomiting, diarrhea, change in bowel habits, loss of appetite, bloody stools.   Resp:   No chest wall  deformity  Skin: no rash or lesions.  GU: no dysuria, change in color of urine, no urgency or frequency.  No flank pain, no hematuria   MS:  No joint pain or swelling.  No decreased range of motion.  No back pain.    Physical Exam  BP 112/60 (BP Location: Left Arm, Patient Position: Sitting, Cuff Size: Large)   Pulse 83   Temp 97.7 F (36.5 C) (Oral)   Ht 5\' 4"  (1.626 m)   Wt 211 lb 6.4 oz (95.9 kg)   SpO2 96%   BMI 36.29 kg/m   GEN: A/Ox3; pleasant , NAD, well nourished , chronically ill-appearing on oxygen   HEENT:  [place]/AT,  EACs-clear, TMs-wnl, NOSE-clear, THROAT-clear, no lesions, no postnasal drip or exudate noted.   NECK:  Supple w/ fair ROM; no JVD; normal carotid impulses w/o bruits; no thyromegaly or nodules palpated; no lymphadenopathy.    RESP coarse rhonchi bilaterally  no accessory muscle use, no dullness to percussion  CARD:  RRR, no m/r/g, tr  peripheral edema, pulses intact, no cyanosis or clubbing.  GI:   Soft & nt; nml bowel sounds; no organomegaly or masses detected.   Musco: Warm bil, no deformities or joint swelling noted.   Neuro: alert, no focal deficits noted.    Skin: Warm, no lesions or rashes    Lab Results:  CBC   BNP   Imaging: No results found.       Latest Ref Rng & Units 07/29/2020    9:06 AM 07/14/2016    9:05 AM 12/06/2013   10:00 AM  PFT Results  FVC-Pre L 1.48  1.48    FVC-Predicted Pre % 46  45  52      FVC-Post L 1.58   1.93      FVC-Predicted Post % 49   57      Pre FEV1/FVC % % 81  87  82      Post FEV1/FCV % % 77   82      FEV1-Pre L 1.19  1.29  1.46      FEV1-Predicted Pre % 49  51  55  FEV1-Post L 1.22   1.58      DLCO uncorrected ml/min/mmHg 10.04   14.71      DLCO UNC% % 50   60      DLCO corrected ml/min/mmHg 10.23   14.75      DLCO COR %Predicted % 51   60      DLVA Predicted % 82   100      TLC L 4.94   3.73      TLC % Predicted % 97   73      RV % Predicted % 158   99         This result is  from an external source.    No results found for: "NITRICOXIDE"      Assessment & Plan:   COPD exacerbation ([place]) Acute COPD exacerbation.-Encouraged on smoking cessation.  We will treat with empiric antibiotics and short burst of steroids  Plan  Patient Instructions  CT chest in 6 weeks (follow up lung nodule )  Doxycycline 100mg  Twice daily  for 7 days , take with food.  Prednisone 20mg  daily for 5 days , take with food.  Mucinex DM Twice daily  As needed  cough/congestion  Tessalon Three times a day  As needed  cough .  Continue on Breztri 2 puffs twice daily, rinse after use Albuterol inhaler or nebulizer as needed Continue on oxygen 4 to 5 L to maintain O2 saturations greater than 88 to 90% Work on not smoking .  Refer to ENT .  Follow up in 2 months in [facility] and As needed   Please contact office for sooner follow up if symptoms do not improve or worsen or seek emergency care     Chronic hypoxemic respiratory failure ([place]) Continue on oxygen to maintain O2 saturations greater than 88 to 90%  Tobacco use disorder Smoking cessation.  Lung mass Cavitary lung mass in the left upper lobe measuring 2.7 cm, hypermetabolic on PET scan suspicious for underlying malignancy.  Unfortunately patient has underlying severe COPD with significant oxygen demands at 4 to 5 L/min.  She is high risk for tissue sampling. We discussed her CT and PET scans in detail with Dr. Shearon Stalls.  Patient is not a candidate for tissue sampling, resection due to underlying COPD and oxygen demands. For now we will repeat her CT chest in 3 months.  If continue to have ongoing enlargement could consider referral to radiation oncology for possible targeted SBRT.  Patient be set up for CT chest early January.  Patient would like to have this done at the [place] office and transfer care to that office as it is much closer to her home.  And she has transportation issues.  Plan  Patient Instructions   CT chest in 6 weeks (follow up lung nodule )  Doxycycline 100mg  Twice daily  for 7 days , take with food.  Prednisone 20mg  daily for 5 days , take with food.  Mucinex DM Twice daily  As needed  cough/congestion  Tessalon Three times a day  As needed  cough .  Continue on Breztri 2 puffs twice daily, rinse after use Albuterol inhaler or nebulizer as needed Continue on oxygen 4 to 5 L to maintain O2 saturations greater than 88 to 90% Work on not smoking .  Refer to ENT .  Follow up in 2 months in [facility] and As needed   Please contact office for sooner follow up if symptoms do not improve  or worsen or seek emergency care     Parotid nodule Parotid nodule hypermetabolic on PET scan.  Refer to ENT for further evaluation    I spent  40  minutes dedicated to the care of this patient on the date of this encounter to include pre-visit review of records, face-to-face time with the patient discussing conditions above, post visit ordering of testing, clinical documentation with the electronic health record, making appropriate referrals as documented, and communicating necessary findings to members of the patients care team.   Rexene Edison, NP 05/31/2022

## 2022-05-31 NOTE — Telephone Encounter (Signed)
Ok with me 

## 2022-06-02 NOTE — Telephone Encounter (Signed)
Called and scheduled new pt appt with RA in Jan. Nothing further needed

## 2022-06-14 ENCOUNTER — Ambulatory Visit: Payer: Medicare Other | Admitting: Podiatry

## 2022-06-15 ENCOUNTER — Encounter: Payer: Self-pay | Admitting: Family Medicine

## 2022-06-21 ENCOUNTER — Ambulatory Visit: Payer: Medicare Other | Admitting: Podiatry

## 2022-06-22 ENCOUNTER — Telehealth: Payer: Self-pay | Admitting: Cardiology

## 2022-06-22 ENCOUNTER — Encounter: Payer: Self-pay | Admitting: Student

## 2022-06-22 NOTE — Telephone Encounter (Signed)
Pt reports that for several days she has been dizzy. Pt has home BP cuff but she is unsure if it is correct. Current BP 93/75 Hr 86. Pt states that she is feeling dizzy now. Denies syncope, chest pain and increased SOB. Pt has not taken Benicar today. Please advise.

## 2022-06-22 NOTE — Telephone Encounter (Signed)
Pt c/o BP issue: STAT if pt c/o blurred vision, one-sided weakness or slurred speech  1. What are your last 5 BP readings?   71/42 - 10:45am, yesterday  77/47 - 11:45am, yesterday  89/62 - this morning   2. Are you having any other symptoms (ex. Dizziness, headache, blurred vision, passed out)? Dizziness, blurred vision (last night), headache   3. What is your BP issue? Pt spouse states her bp was low at her psychiatrist doctor appt yesterday and this morning.

## 2022-06-22 NOTE — Telephone Encounter (Signed)
Error

## 2022-06-22 NOTE — Patient Instructions (Incomplete)

## 2022-06-22 NOTE — Telephone Encounter (Signed)
Any reason she would be dehydrated. Vomiting, diarrhea? Not eating or drinking well. Would hold her olmesartan HCTZ for now and update [place] on bp's Thursday. Needs to be aggressive drinking lots of fluid today and tomorrow  Zandra Abts MD

## 2022-06-22 NOTE — Telephone Encounter (Signed)
Returned call to pt, no answer on home or mobile. Voicemail full.

## 2022-06-23 NOTE — Telephone Encounter (Signed)
Patient returning call.

## 2022-06-23 NOTE — Telephone Encounter (Signed)
Patient stated that she doesn't have much of an appetite to begin with, but said she is not having any digestive issues or nausea. Patient stated she will hold Olmesartan- HCTZ and call office back this week with bp readings.   Patient had no further questions/concerns.

## 2022-06-24 ENCOUNTER — Ambulatory Visit: Payer: Medicare Other | Admitting: Nurse Practitioner

## 2022-06-24 DIAGNOSIS — E119 Type 2 diabetes mellitus without complications: Secondary | ICD-10-CM

## 2022-06-24 DIAGNOSIS — I10 Essential (primary) hypertension: Secondary | ICD-10-CM

## 2022-06-25 NOTE — Telephone Encounter (Signed)
BP is fine, will see how looks on further update next week  Zandra Abts MD

## 2022-06-25 NOTE — Telephone Encounter (Signed)
Patient called and stated that she took her bp on 12/21 with the reading at 117/62 hr- 88. Pt stated she lost the paper the rest of her readings were on and did not take her bp today. Pt will keep track of bp's throughout the holiday weekend and report back on Tuesday.

## 2022-06-29 NOTE — Telephone Encounter (Signed)
I spoke with patient and relayed Dr.Branch's message.She agrees with plan.

## 2022-06-29 NOTE — Telephone Encounter (Signed)
Pt c/o BP issue: STAT if pt c/o blurred vision, one-sided weakness or slurred speech  1. What are your last 5 BP readings?  12/24 10:23 AM 130/76 HR 92 7:02 PM 152/81 HR 92 12/25 6:16 AM 135/76 HR 82 6:29 PM 136/72 HR 92 12/26 7:45 AM 138/89 HR 84  2. Are you having any other symptoms (ex. Dizziness, headache, blurred vision, passed out)? Dizziness, but none today. Headache and nausea with lack of appetite   3. What is your BP issue? BP readings over holiday as requested on 12/22

## 2022-06-29 NOTE — Telephone Encounter (Signed)
BP's mildly elevated. Given some ongoing nausea and poor appetite would remain off bp meds for now, can reassess at her upcoming f/u if perhaps need to start back any bp meds perhaps at a lower dose   J Asiah Befort MD

## 2022-07-01 ENCOUNTER — Telehealth: Payer: Self-pay | Admitting: Adult Health

## 2022-07-01 MED ORDER — PREDNISONE 10 MG PO TABS: ORAL_TABLET | ORAL | 0 refills | Status: AC

## 2022-07-01 MED ORDER — AZITHROMYCIN 250 MG PO TABS: ORAL_TABLET | ORAL | 0 refills | Status: AC

## 2022-07-01 NOTE — Telephone Encounter (Signed)
Zpak Prednisone 10 mg take  4 each am x 2 days,   2 each am x 2 days,  1 each am x 2 days and stop   Ov when can get transportation but must bring all meds or will need to reschdule

## 2022-07-01 NOTE — Telephone Encounter (Signed)
Called and spoke with patient. She is unable to come in to office today or tomorrow due to lack of transportation. She wants to know if Dr. Melvyn Novas would be willing to send in medication for her to pharmacy to be delivered for her symptoms. Please advise.

## 2022-07-01 NOTE — Telephone Encounter (Signed)
See if we can get her in with all meds/ inhalers in hand - ok to double book at end of clinic either 12/28 or 12/ 29

## 2022-07-01 NOTE — Telephone Encounter (Signed)
Primary Pulmonologist: Wert Last office visit and with whom: 05/31/2022  Parrett What do we see them for (pulmonary problems): Parotid nodule, Lung nodule, COPD, chronic hypoxemic respiratory failure, lung mass, smoker Last OV assessment/plan:    Assessment & Plan Note by Melvenia Needles, NP at 05/31/2022 5:25 PM  Author: Melvenia Needles, NP Author Type: Nurse Practitioner Filed: 05/31/2022  5:25 PM  Note Status: Written Cosign: Cosign Not Required Encounter Date: 05/31/2022  Problem: Chronic hypoxemic respiratory failure ([place])  Editor: Melvenia Needles, NP (Nurse Practitioner)             Continue on oxygen to maintain O2 saturations greater than 88 to 90%        Assessment & Plan Note by Melvenia Needles, NP at 05/31/2022 5:24 PM  Author: Melvenia Needles, NP Author Type: Nurse Practitioner Filed: 05/31/2022  5:24 PM  Note Status: Written Cosign: Cosign Not Required Encounter Date: 05/31/2022  Problem: COPD exacerbation ([place])  Editor: Melvenia Needles, NP (Nurse Practitioner)             Acute COPD exacerbation.-Encouraged on smoking cessation.  We will treat with empiric antibiotics and short burst of steroids   Plan  Patient Instructions  CT chest in 6 weeks (follow up lung nodule )  Doxycycline 100mg  Twice daily  for 7 days , take with food.  Prednisone 20mg  daily for 5 days , take with food.  Mucinex DM Twice daily  As needed  cough/congestion  Tessalon Three times a day  As needed  cough .  Continue on Breztri 2 puffs twice daily, rinse after use Albuterol inhaler or nebulizer as needed Continue on oxygen 4 to 5 L to maintain O2 saturations greater than 88 to 90% Work on not smoking .  Refer to ENT .  Follow up in 2 months in [facility] and As needed   Please contact office for sooner follow up if symptoms do not improve or worsen or seek emergency care            Patient Instructions by Melvenia Needles, NP at 05/31/2022 11:30 AM  Author: Melvenia Needles, NP Author Type: Nurse Practitioner Filed: 05/31/2022 11:38 AM  Note Status: Addendum Cosign: Cosign Not Required Encounter Date: 05/31/2022  Editor: Melvenia Needles, NP (Nurse Practitioner)      Prior Versions: 1. Parrett, Fonnie Mu, NP (Nurse Practitioner) at 05/31/2022 11:28 AM - Addendum   2. Parrett, Fonnie Mu, NP (Nurse Practitioner) at 05/31/2022 11:27 AM - Addendum   3. Parrett, Fonnie Mu, NP (Nurse Practitioner) at 05/31/2022 11:24 AM - Signed  CT chest in 6 weeks (follow up lung nodule )  Doxycycline 100mg  Twice daily  for 7 days , take with food.  Prednisone 20mg  daily for 5 days , take with food.  Mucinex DM Twice daily  As needed  cough/congestion  Tessalon Three times a day  As needed  cough .  Continue on Breztri 2 puffs twice daily, rinse after use Albuterol inhaler or nebulizer as needed Continue on oxygen 4 to 5 L to maintain O2 saturations greater than 88 to 90% Work on not smoking .  Refer to ENT .  Follow up in 2 months in [facility] and As needed   Please contact office for sooner follow up if symptoms do not improve or worsen or seek emergency care        Orthostatic Vitals Recorded in This Encounter   05/31/2022 1115  Patient Position: Sitting  BP Location: Left Arm  Cuff Size: Large   Instructions  CT chest in 6 weeks (follow up lung nodule )  Doxycycline 100mg  Twice daily  for 7 days , take with food.  Prednisone 20mg  daily for 5 days , take with food.  Mucinex DM Twice daily  As needed  cough/congestion  Tessalon Three times a day  As needed  cough .  Continue on Breztri 2 puffs twice daily, rinse after use Albuterol inhaler or nebulizer as needed Continue on oxygen 4 to 5 L to maintain O2 saturations greater than 88 to 90% Work on not smoking .  Refer to ENT .  Follow up in 2 months in [facility] and As needed   Please contact office for sooner follow up if symptoms do not improve or worsen or seek emergency care       Was  appointment offered to patient (explain)?  no   Reason for call: Feeling bad for 3-4 days, congestion in chest, coughing, sneezing.  Coughing up mostly white with some yellow and green.  Coughing slowing down yesterday and today.  She has a HA on the top of her head (has migraines), took imitrex and it does not seem to help.  She will have her PCP refer her to a neurologist in [place].  No more sob than what is normal for her.  Her oxygen is on 4L which is normal for her.  She quit smoking for 4-5 days and then went back to smoking again.  She is requesting an antibiotic or steroid for her symptoms.  She wants to catch the symptoms early.  She denied any fever, chills or body aches.  Dr. Melvyn Novas, please advise.  Thank you.  (examples of things to ask: : When did symptoms start? Fever? Cough? Productive? Color to sputum? More sputum than usual? Wheezing? Have you needed increased oxygen? Are you taking your respiratory medications? What over the counter measures have you tried?)  Allergies  Allergen Reactions   Penicillins Other (See Comments)    Patient is unsure if she allergic to penicillin or septra. Patient states one or another caused "rib pain with a little breathing problem". Has patient had a PCN reaction causing immediate rash, facial/tongue/throat swelling, SOB or lightheadedness with hypotension: YES Has patient had a PCN reaction causing severe rash involving mucus membranes or skin necrosis: NO Has patient had a PCN reaction that required hospitalization: NO Has patient had a PCN reaction occurring within the last 10 years: NO   Septra [Sulfamethoxazole-Trimethoprim] Other (See Comments)    Patient is unsure if she allergic to septra or penicillin. Patient states one or another caused "rib pain with a little breathing problem".    Immunization History  Administered Date(s) Administered   Fluad Quad(high Dose 65+) 04/30/2022   Influenza Whole 03/29/2019   Influenza-Unspecified  03/06/2019, 04/13/2021   Moderna Sars-Covid-2 Vaccination 02/22/2020, 04/18/2020   Pneumococcal Polysaccharide-23 07/06/2014   Td (Adult),5 Lf Tetanus Toxid, Preservative Free 07/18/1996   Tdap 11/30/2013

## 2022-07-01 NOTE — Telephone Encounter (Signed)
Called and notified patient of response. Scheduled an ov for her 2/13. Nothing further needed at this time.

## 2022-07-12 ENCOUNTER — Telehealth: Payer: Self-pay | Admitting: Adult Health

## 2022-07-12 NOTE — Telephone Encounter (Signed)
Pt needs to be seen for an appt if she is still not feeling well after recent meds.   Attempted to call pt but unable to reach. Left message for pt letting her know that we will need her to be seen for an appt prior to receiving more meds.

## 2022-07-12 NOTE — Telephone Encounter (Signed)
PT calling saying she is still sick. Was given Antibx and Pred. States a little better but still has green phlegm. Please call to advise.  [facility] in [place].

## 2022-07-13 ENCOUNTER — Ambulatory Visit (HOSPITAL_COMMUNITY)
Admission: RE | Admit: 2022-07-13 | Discharge: 2022-07-13 | Disposition: A | Discharge status: Home / Self Care | Payer: Medicare Other | Source: Ambulatory Visit | Attending: Adult Health | Admitting: Adult Health

## 2022-07-13 DIAGNOSIS — R911 Solitary pulmonary nodule: Secondary | ICD-10-CM | POA: Diagnosis not present

## 2022-07-14 ENCOUNTER — Other Ambulatory Visit: Payer: Self-pay | Admitting: Internal Medicine

## 2022-07-16 ENCOUNTER — Ambulatory Visit: Payer: Medicare Other | Admitting: Adult Health

## 2022-07-19 ENCOUNTER — Ambulatory Visit: Payer: Medicare Other | Admitting: Adult Health

## 2022-07-20 ENCOUNTER — Other Ambulatory Visit: Payer: Self-pay | Admitting: Adult Health

## 2022-07-20 NOTE — Progress Notes (Signed)
ATC x1. Mobile #.  Mailbox full, unable to leave a message.  ATC x1 home #.  No VM set up, unable to leave a message.

## 2022-07-21 ENCOUNTER — Ambulatory Visit: Payer: Medicare Other | Admitting: Medical

## 2022-07-22 ENCOUNTER — Telehealth: Payer: Self-pay | Admitting: Pulmonary Disease

## 2022-07-22 MED ORDER — AZITHROMYCIN 250 MG PO TABS: ORAL_TABLET | ORAL | 0 refills | Status: DC

## 2022-07-22 NOTE — Telephone Encounter (Signed)
Oretha Milch, MD  to Adriana Mccallum, [facility]     07/22/22 10:53 AM  Okay to send in Z-Pak. Take Mucinex 600 mg twice daily She should test for COVID We can assess during her office visit on 1/22 if she needs any more medication      Called and spoke with pt letting her know recs per Dr. Vassie Loll and she verbalized understanding. Med sent to preferred pharmacy. Nothing further needed.

## 2022-07-23 ENCOUNTER — Telehealth: Payer: Self-pay | Admitting: Pulmonary Disease

## 2022-07-23 DIAGNOSIS — I2510 Atherosclerotic heart disease of native coronary artery without angina pectoris: Secondary | ICD-10-CM | POA: Insufficient documentation

## 2022-07-23 DIAGNOSIS — R079 Chest pain, unspecified: Secondary | ICD-10-CM | POA: Insufficient documentation

## 2022-07-23 DIAGNOSIS — G4700 Insomnia, unspecified: Secondary | ICD-10-CM | POA: Insufficient documentation

## 2022-07-23 NOTE — Telephone Encounter (Signed)
Patient would like the nurse to call her regarding her upcoming appt. On Monday.  She wants to know if she should still get a covid test before her appt.  She said the doctor told her to get the test.  Please advise and call patient before her appt.  CB# [phone]

## 2022-07-26 ENCOUNTER — Ambulatory Visit (INDEPENDENT_AMBULATORY_CARE_PROVIDER_SITE_OTHER): Payer: 59 | Admitting: Pulmonary Disease

## 2022-07-26 ENCOUNTER — Telehealth: Payer: Self-pay | Admitting: Radiation Oncology

## 2022-07-26 ENCOUNTER — Encounter: Payer: Self-pay | Admitting: Pulmonary Disease

## 2022-07-26 VITALS — BP 134/78 | HR 100 | Temp 98.6°F | Ht 64.0 in | Wt 201.6 lb

## 2022-07-26 DIAGNOSIS — K118 Other diseases of salivary glands: Secondary | ICD-10-CM | POA: Diagnosis not present

## 2022-07-26 DIAGNOSIS — J441 Chronic obstructive pulmonary disease with (acute) exacerbation: Secondary | ICD-10-CM

## 2022-07-26 DIAGNOSIS — J9611 Chronic respiratory failure with hypoxia: Secondary | ICD-10-CM

## 2022-07-26 DIAGNOSIS — R911 Solitary pulmonary nodule: Secondary | ICD-10-CM | POA: Diagnosis not present

## 2022-07-26 DIAGNOSIS — R918 Other nonspecific abnormal finding of lung field: Secondary | ICD-10-CM

## 2022-07-26 MED ORDER — PREDNISONE 10 MG PO TABS: ORAL_TABLET | ORAL | 0 refills | Status: AC

## 2022-07-26 MED ORDER — METHYLPREDNISOLONE ACETATE 80 MG/ML IJ SUSP
80.0000 mg | Freq: Once | INTRAMUSCULAR | Status: AC
  Administered 2022-07-26: 80 mg via INTRAMUSCULAR

## 2022-07-26 NOTE — Assessment & Plan Note (Signed)
Continue 4 L of oxygen at all times

## 2022-07-26 NOTE — Telephone Encounter (Signed)
Unable to leave message for patient to call back to schedule consult per 1/22 referral due to voicemail not being set up. Will try again later.

## 2022-07-26 NOTE — Assessment & Plan Note (Addendum)
Left upper lobe lung mass was cavitary and now appears more solid, size is unchanged.  This is likely to be malignancy.  This was not present in 2019. I explained to the patient and her husband that with hypermetabolism on PET scan and its appearance, this is very likely to be malignancy.  We discussed risks of biopsy including that of lung puncture requiring chest tube, nonresolving bronchopleural fistula and chance of respiratory failure requiring mechanical ventilation and even death.  She would like to think about this some more but very likely does not want to go through this procedure. I discussed options of taking a wait and watch approach versus empiric radiation therapy.  She is certainly not a candidate for surgery and without a biopsy she cannot be put through chemotherapy. We made an office visit to continue this conversation in 1 month.  We will refer her to radiation oncology in the interim Will plan to repeat CT chest with contrast in 3 months

## 2022-07-26 NOTE — Assessment & Plan Note (Signed)
She is compliant with Breztri and use albuterol for rescue. She continues to have persistent bronchospasm. Will give Solu-Medrol 80 mg IM today use a longer prednisone course starting at 40 mg for 2 weeks. Will consider adding Roflumilast to decrease exacerbations, she does have history of depression which complicates

## 2022-07-26 NOTE — Progress Notes (Signed)
Subjective:    Patient ID: [name], female    DOB: Sep 10, 1955, 68 y.o.   MRN: [id number]  HPI  67 yo  smoker for FU severe COPD and oxygen dependent respiratory failure with baseline oxygen at 4 to 5 L/min.  Has been on oxygen since 2019 PMH - r bipolar disorder and polysubstance abuse   Cavitary lung mass in the left upper lobe measuring 2.7 cm, hypermetabolic on PET scan suspicious for underlying malignancy.  Unfortunately patient has underlying severe COPD with significant oxygen demands at 4 to 5 L/min.  She is high risk for tissue sampling  She had transportation issues and requested CT chest to be set up at [facility] and wanted to follow at the [place] office and transfer her care to me, hence presents with her husband to establish care  Chief Complaint  Patient presents with   New Patient (Initial Visit)    New patient from Dr. Sherene Sires    I have reviewed previous office visits and multiple phone notes interim  12/28 Feeling bad for 3-4 days, congestion in chest, coughing, sneezing. Coughing up mostly white with some yellow and green. >> Zpak + pred  1/18 Pt called stating that she is still sick with a productive cough and a lot of congestion. Pt states she has not tested for Covid  Z-Pak.&  Mucinex   [name] is accompanied by her husband, on oxygen.  Complains of residual congestion.  She remains on 4 L nasal cannula Has still not visited with ENT  We discussed CT chest in detail CT chest shows decreased cavitary portion of the left upper lung lesion. Stable bronchial ectasis Stable left hepatic tissue nodule. Healing left-sided rib fractures  Incidental findings atherosclerosis and stable nodular left-sided breast parenchyma.  Had mammogram in October)   Significant tests/ events reviewed] PFTs 1/ 2022 FEV1 50%, ratio 77, FVC 49%, no significant bronchodilator response, DLCO 50%     07/2022 CT chest cavitary portion has now filled up left upper lobe  lesion is 2.4 x 1.6 cm in size, mostly unchanged from 2.2 x 1.9 cm in September  03/2022 CT chest showed a cavitary solid 2.7 cm apical left upper lobe pulmonary nodule increased in size.  Suspicious for underlying carcinoma.   Previous CT in 2019 showed airspace disease  PET scan 04/2022 enlarging cavitary lesion in left apex with mild hypermetabolic /2023 activity suspicious for adenocarcinoma.  No evidence of metastatic disease.  Small hypermetabolic nodule in the left parotid lobe.    Past Medical History:  Diagnosis Date   Anemia    Anxiety    Arthritis    Bipolar 1 disorder (HCC)    CHF (congestive heart failure) (HCC)    COPD (chronic obstructive pulmonary disease) (HCC)    Depression    Diabetes mellitus without complication (HCC)    Dyspnea    Emphysema of lung (HCC)    GERD (gastroesophageal reflux disease)    Headache    migraines   History of hiatal hernia    History of kidney stones    Hyperlipidemia    Hypertension    Neuromuscular disorder (HCC)    neuropathy   On home oxygen therapy    Pneumonia 2015, 2019   Tracheomalacia    Vaginal Pap smear, abnormal      Review of Systems Patient denies significant dyspnea,cough, hemoptysis,  chest pain, palpitations, pedal edema, orthopnea, paroxysmal nocturnal dyspnea, lightheadedness, nausea, vomiting, abdominal or  leg pains  Objective:   Physical Exam  Gen. Pleasant, well-nourished, in no distress, anxious affect, on oxygen nasal cannula 4 L ENT - no thrush, no pallor/icterus,no post nasal drip Neck: No JVD, no thyromegaly, no carotid bruits Lungs: no use of accessory muscles, no dullness to percussion, bilateral scattered rhonchi   Cardiovascular: Rhythm regular, heart sounds  normal, no murmurs or gallops, no peripheral edema Musculoskeletal: No deformities, no cyanosis or clubbing        Assessment & Plan:

## 2022-07-26 NOTE — Patient Instructions (Signed)
  X solumedrol 80 mg IM x 1  X Prednisone 10 mg tabs Take 4 tabs  daily with food x 4 days, then 3 tabs daily x 4 days, then 2 tabs daily x 4 days, then 1 tab daily x4 days then stop. #40  Continue on Breztri - twice daily Use albuterol nebs as needed every 6h  We discussed biopsy & 20% risk of complication Other option is wait & watch OR go through radiation therapy  X refer to radiation oncology  X Ct chest w con in 3 months

## 2022-07-27 ENCOUNTER — Telehealth: Payer: Self-pay | Admitting: Radiation Oncology

## 2022-07-27 NOTE — Telephone Encounter (Signed)
Unable to leave message for patient to call back to schedule consult per 1/22 referral. Will try again later.

## 2022-07-27 NOTE — Telephone Encounter (Signed)
Pt had appt with Dr. Vassie Loll 1/22. Closing encounter.

## 2022-07-28 ENCOUNTER — Ambulatory Visit (INDEPENDENT_AMBULATORY_CARE_PROVIDER_SITE_OTHER): Payer: 59 | Admitting: Podiatry

## 2022-07-28 ENCOUNTER — Other Ambulatory Visit: Payer: Self-pay | Admitting: Adult Health

## 2022-07-28 ENCOUNTER — Encounter: Payer: Self-pay | Admitting: Podiatry

## 2022-07-28 DIAGNOSIS — E119 Type 2 diabetes mellitus without complications: Secondary | ICD-10-CM

## 2022-07-28 DIAGNOSIS — M79674 Pain in right toe(s): Secondary | ICD-10-CM | POA: Diagnosis not present

## 2022-07-28 DIAGNOSIS — B351 Tinea unguium: Secondary | ICD-10-CM

## 2022-07-28 NOTE — Progress Notes (Signed)

## 2022-07-29 ENCOUNTER — Ambulatory Visit: Payer: Medicaid Other | Admitting: Adult Health

## 2022-08-02 NOTE — Progress Notes (Incomplete)
Thoracic Location of Tumor / Histology: Left Upper Lobe  Patient presented   CT Chest 07/13/2022: The left lung has a soft tissue nodule in the left upper lobe which previously was cavitary and measure 2.2 x 1.9 cm. This was hypermetabolic on the PET-CT scan. Today the lesion measures 2.4 by 1.6 cm.  PET 04/08/2022: The enlarging cavitary lesion at the left apex demonstrates mild hypermetabolic activity, suspicious for adenocarcinoma.  No evidence of metastatic disease.  Small hypermetabolic nodule in the peripheral left parotid lobe, nonspecific, although likely a small incidental salivary gland neoplasm.   Biopsies of    Tobacco/Marijuana/Snuff/ETOH use:   Past/Anticipated interventions by cardiothoracic surgery, if any:   Past/Anticipated interventions by medical oncology, if any:   Signs/Symptoms Weight changes, if any:  Respiratory complaints, if any: Has COPD at baseline, wears oxygen 4-5 liters. Hemoptysis, if any:  Pain issues, if any:    SAFETY ISSUES: Prior radiation?  Pacemaker/ICD?   Possible current pregnancy? Postmenopausal Is the patient on methotrexate?   Current Complaints / other details:

## 2022-08-02 NOTE — Progress Notes (Incomplete)
Radiation Oncology         [phone] ________________________________  Name: [name]        MRN: [id number]  Date of Service: 08/04/2022 DOB: 1956-04-30  EN:IDPOEUMPNTI, Ferdinand Lango, NP  Rigoberto Noel, MD     REFERRING PHYSICIAN: Rigoberto Noel, MD   DIAGNOSIS: There were no encounter diagnoses.   HISTORY OF PRESENT ILLNESS: [name] is a 67 y.o. female seen at the request of Dr. Elsworth Soho for a ***    PREVIOUS RADIATION THERAPY: {EXAM; YES/NO:19492::"No"}   PAST MEDICAL HISTORY:  Past Medical History:  Diagnosis Date   Anemia    Anxiety    Arthritis    Bipolar 1 disorder ([place])    CHF (congestive heart failure) (HCC)    COPD (chronic obstructive pulmonary disease) (HCC)    Depression    Diabetes mellitus without complication (HCC)    Dyspnea    Emphysema of lung (HCC)    GERD (gastroesophageal reflux disease)    Headache    migraines   History of hiatal hernia    History of kidney stones    Hyperlipidemia    Hypertension    Neuromuscular disorder (HCC)    neuropathy   On home oxygen therapy    Pneumonia 2015, 2019   Tracheomalacia    Vaginal Pap smear, abnormal        PAST SURGICAL HISTORY: Past Surgical History:  Procedure Laterality Date   CATARACT EXTRACTION W/PHACO Left 01/14/2020   Procedure: CATARACT EXTRACTION PHACO AND INTRAOCULAR LENS PLACEMENT LEFT EYE;  Surgeon: Baruch Goldmann, MD;  Location: [facility];  Service: Ophthalmology;  Laterality: Left;  CDE: 8.18   CATARACT EXTRACTION W/PHACO Right 02/01/2020   Procedure: CATARACT EXTRACTION PHACO AND INTRAOCULAR LENS PLACEMENT RIGHT EYE;  Surgeon: Baruch Goldmann, MD;  Location: [facility];  Service: Ophthalmology;  Laterality: Right;  CDE: 9.94   CHEILECTOMY Right 01/28/2020   Procedure: KELLER BUNION IMPLANT RIGHT FOOT CHEILECTOMY RIGHT ROOT;  Surgeon: Landis Martins, DPM;  Location: [facility];  Service: Podiatry;  Laterality: Right;  BLOCK   CHOLECYSTECTOMY     COLONOSCOPY  July  2010   Dr. Arnoldo Morale: 3 rectal polyps, not enough tissue for pathologic examination, recommended surveillance in 3 years   COLONOSCOPY N/A 10/23/2014   RMR: Multiple colonic polyps removed as described above. No endoscopic explaniation for abdominal pain. however. next tcs 10/2019   ESOPHAGOGASTRODUODENOSCOPY  July 2010   Dr. Arnoldo Morale: gastritis and duodenitis, H.pylori negative   ESOPHAGOGASTRODUODENOSCOPY N/A 10/23/2014   RMR: Normal EGD. Status post passage of a Maloney dilator. Today's finding s would not explain abdominal pain   EYE SURGERY Left 01/14/2020   cataract removal   GANGLION CYST EXCISION Left 09/07/2018   Procedure: REMOVAL GANGLION OF WRIST;  Surgeon: Carole Civil, MD;  Location: [facility];  Service: Orthopedics;  Laterality: Left;   HERNIA REPAIR     Dr. Arnoldo Morale   KIDNEY STONE SURGERY     MALONEY DILATION N/A 10/23/2014   Procedure: Venia Minks DILATION;  Surgeon: Daneil Dolin, MD;  Location: [facility];  Service: Endoscopy;  Laterality: N/A;   OPEN REDUCTION INTERNAL FIXATION (ORIF) DISTAL RADIAL FRACTURE Right 12/09/2015   Procedure: OPEN REDUCTION INTERNAL FIXATION (ORIF) RIGHT DISTAL RADIUS;  Surgeon: Leanora Cover, MD;  Location: [facility];  Service: Orthopedics;  Laterality: Right;     FAMILY HISTORY:  Family History  Adopted: Yes     SOCIAL HISTORY:  reports that she has been  smoking cigarettes. She started smoking about 55 years ago. She has a 104.00 pack-year smoking history. She has never used smokeless tobacco. She reports current alcohol use of about 1.0 standard drink of alcohol per week. She reports current drug use. Drug: Cocaine.   ALLERGIES: Penicillins and Septra [sulfamethoxazole-trimethoprim]   MEDICATIONS:  Current Outpatient Medications  Medication Sig Dispense Refill   Accu-Chek Softclix Lancets lancets Use as instructed to monitor glucose twice daily 100 each 12   acetaminophen (TYLENOL) 500 MG tablet Take 1 tablet (500 mg  total) by mouth every 6 (six) hours as needed. (Patient taking differently: Take 1,000 mg by mouth every 6 (six) hours as needed.) 30 tablet 0   albuterol (PROVENTIL) (2.5 MG/3ML) 0.083% nebulizer solution INHALE 1 VIAL VIA NEBULIZER EVERY 4 HOURS 360 mL 2   albuterol (VENTOLIN HFA) 108 (90 Base) MCG/ACT inhaler INHALE 1 OR 2 PUFFS BY MOUTH EVERY 6 HOURS AS NEEDED FOR WHEEZING OR SHORTNESS OF BREATH 8.5 g 5   aspirin EC 81 MG tablet Take 1 tablet (81 mg total) by mouth daily. Swallow whole. 30 tablet 2   benzonatate (TESSALON) 200 MG capsule Take 1 capsule (200 mg total) by mouth 3 (three) times daily as needed. 45 capsule 1   Budeson-Glycopyrrol-Formoterol (BREZTRI AEROSPHERE) 160-9-4.8 MCG/ACT AERO Inhale 2 puffs into the lungs 2 (two) times daily. 10.7 g 1   cetirizine (ZYRTEC) 10 MG tablet Take 10 mg by mouth daily as needed for allergies.      clopidogrel (PLAVIX) 75 MG tablet Take 1 tablet (75 mg total) by mouth daily. 30 tablet 2   clotrimazole (MYCELEX) 10 MG troche Take 1 tablet (10 mg total) by mouth 5 (five) times daily. 35 Troche 0   cycloSPORINE (RESTASIS) 0.05 % ophthalmic emulsion Place 1 drop into both eyes 2 (two) times daily as needed (dry eyes).     doxycycline (VIBRA-TABS) 100 MG tablet Take 1 tablet (100 mg total) by mouth 2 (two) times daily. 14 tablet 0   DULoxetine (CYMBALTA) 20 MG capsule Take 20 mg by mouth daily.     gabapentin (NEURONTIN) 800 MG tablet Take 1 tablet (800 mg total) by mouth 3 (three) times daily as needed.     glucose blood (ACCU-CHEK GUIDE) test strip Use as instructed to monitor glucose twice daily 100 each 12   ipratropium (ATROVENT) 0.02 % nebulizer solution Take 2.5 mLs by nebulization every 4 (four) hours as needed for wheezing or shortness of breath.     Ipratropium-Albuterol (COMBIVENT RESPIMAT) 20-100 MCG/ACT AERS respimat INHALE 1 PUFF INTO THE LUNGS EVERY (4) HOURS. 4 g 5   metFORMIN (GLUCOPHAGE) 1000 MG tablet Take 1 tablet (1,000 mg total) by  mouth 2 (two) times daily. 180 tablet 3   nitroGLYCERIN (NITROSTAT) 0.4 MG SL tablet Place under the tongue.     olmesartan-hydrochlorothiazide (BENICAR HCT) 20-12.5 MG tablet TAKE (1) TABLET BY MOUTH DAILY 30 tablet 0   ondansetron (ZOFRAN-ODT) 4 MG disintegrating tablet Take 4 mg by mouth 2 (two) times daily as needed for nausea/vomiting.     pantoprazole (PROTONIX) 40 MG tablet Take 40 mg by mouth daily.     predniSONE (DELTASONE) 10 MG tablet Take 4 tablets (40 mg total) by mouth daily with breakfast for 4 days, THEN 3 tablets (30 mg total) daily with breakfast for 4 days, THEN 2 tablets (20 mg total) daily with breakfast for 4 days, THEN 1 tablet (10 mg total) daily with breakfast for 4 days. 40 tablet 0  rOPINIRole (REQUIP) 1 MG tablet Take 1 mg by mouth 3 (three) times daily.      rosuvastatin (CRESTOR) 10 MG tablet Take 1 tablet (10 mg total) by mouth daily. 90 tablet 3   SUMAtriptan (IMITREX) 25 MG tablet Take 25 mg by mouth every 2 (two) hours as needed for migraine.     thiamine 100 MG tablet Take 1 tablet (100 mg total) by mouth daily. 30 tablet 1   torsemide (DEMADEX) 20 MG tablet Take 1 tablet (20 mg total) by mouth daily as needed. May take an additional 20 mg daily as needed for extreme swelling. 30 tablet 0   No current facility-administered medications for this visit.     REVIEW OF SYSTEMS: On review of systems, the patient reports that  ***     PHYSICAL EXAM:  Wt Readings from Last 3 Encounters:  07/26/22 201 lb 9.6 oz (91.4 kg)  05/31/22 211 lb 6.4 oz (95.9 kg)  05/25/22 209 lb 12.8 oz (95.2 kg)   Temp Readings from Last 3 Encounters:  07/26/22 98.6 F (37 C)  05/31/22 97.7 F (36.5 C) (Oral)  03/30/22 97.9 F (36.6 C) (Oral)   BP Readings from Last 3 Encounters:  07/26/22 134/78  05/31/22 112/60  05/25/22 110/69   Pulse Readings from Last 3 Encounters:  07/26/22 100  05/31/22 83  05/25/22 84    /10  In general this is a well appearing *** in no  acute distress. ***'s alert and oriented x4 and appropriate throughout the examination. Cardiopulmonary assessment is negative for acute distress and *** exhibits normal effort.     ECOG = ***  0 - Asymptomatic (Fully active, able to carry on all predisease activities without restriction)  1 - Symptomatic but completely ambulatory (Restricted in physically strenuous activity but ambulatory and able to carry out work of a light or sedentary nature. For example, light housework, office work)  2 - Symptomatic, <50% in bed during the day (Ambulatory and capable of all self care but unable to carry out any work activities. Up and about more than 50% of waking hours)  3 - Symptomatic, >50% in bed, but not bedbound (Capable of only limited self-care, confined to bed or chair 50% or more of waking hours)  4 - Bedbound (Completely disabled. Cannot carry on any self-care. Totally confined to bed or chair)  5 - Death   Eustace Pen MM, Creech RH, Tormey DC, et al. [phone]). "Toxicity and response criteria of the [facility]". [place] Oncol. 5 (6): 649-55    LABORATORY DATA:  Lab Results  Component Value Date   WBC 16.4 (H) 03/30/2022   HGB 11.5 (L) 03/30/2022   HCT 34.8 (L) 03/30/2022   MCV 95.8 03/30/2022   PLT 308.0 03/30/2022   Lab Results  Component Value Date   NA 131 (L) 03/30/2022   K 4.3 03/30/2022   CL 89 (L) 03/30/2022   CO2 36 (H) 03/30/2022   Lab Results  Component Value Date   ALT 27 01/02/2022   AST 32 01/02/2022   ALKPHOS 75 01/02/2022   BILITOT 0.3 01/02/2022      RADIOGRAPHY: CT Chest Wo Contrast  Addendum Date: 07/13/2022   ADDENDUM REPORT: 07/13/2022 18:18 ADDENDUM: Additional impression: Healing new anterior left-sided rib fractures from October 2023. Electronically Signed   By: Jill Side M.D.   On: 07/13/2022 18:18   Result Date: 07/13/2022 CLINICAL DATA:  Chronic cough.  COPD.  Lung nodule EXAM: CT CHEST  WITHOUT CONTRAST TECHNIQUE:  Multidetector CT imaging of the chest was performed following the standard protocol without IV contrast. RADIATION DOSE REDUCTION: This exam was performed according to the departmental dose-optimization program which includes automated exposure control, adjustment of the mA and/or kV according to patient size and/or use of iterative reconstruction technique. COMPARISON:  PET-CT 04/08/2022.  Chest CT 03/22/2022 and older FINDINGS: Cardiovascular: Normal caliber thoracic aorta on this noncontrast examination with some vascular calcifications. Coronary artery calcifications are seen. The heart is nonenlarged. No significant pericardial effusion. Mediastinum/Nodes: Normal caliber thoracic aorta. The lumen is slightly patulous with air. No specific abnormal lymph node enlargement present in the axillary region, hilum or mediastinum on this noncontrast exam. A few small mediastinal nodes are seen, nonpathologic by size criteria and unchanged from previous. Nodular breast tissue again seen in the left side. Please correlate with prior workup and dedicated screening mammography. Lungs/Pleura: The right lung in the upper lobe has some peribronchial thickening with some interstitial changes in trace ground-glass. Minimal changes in the right lower lobe. There is some dependent atelectasis as well the right lung base. No right-sided effusion or consolidation. The left lung has a soft tissue nodule in the left upper lobe which previously was cavitary and measure 2.2 x 1.9 cm. This was hypermetabolic on the PET-CT scan. Today lesion on series 4, image 32 measures 2.4 by 1.6 cm. There is some peribronchial thickening of the left lung with some subtle ground-glass and bronchiectasis particularly in the lower lobe and central lingula. No pneumothorax or effusion on the left lung. Upper Abdomen: The adrenal glands are preserved. There is a soft tissue nodule identified anterior to left hepatic lobe and along the anterior abdominal  wall are midline on series 2, image 144 measuring 2.1 x 1.5 cm. This has been present since a CT scan of the abdomen and pelvis from 2016. Musculoskeletal: Diffuse degenerative changes of the spine with some curvature. Healing anterior left-sided rib fractures are identified. These are new from the prior PET-CT scan of October 2023. IMPRESSION: Left upper lobe lesion is now less cavitary than previous. Similar in size when adjusting for technique. Aggressive lesion is still possible. Please correlate for any prior workup or additional evaluation when appropriate. Few scattered areas bilaterally of peribronchial thickening, ground-glass and bronchiectasis. Appearance is similar to prior. Stable nodular left-sided breast parenchyma which is asymmetric. Please correlate with with screening mammography either prior or follow-up. Aortic Atherosclerosis (ICD10-I70.0). Electronically Signed: By: Jill Side M.D. On: 07/13/2022 18:09       IMPRESSION/PLAN: 1. ***  In a visit lasting *** minutes, greater than 50% of the time was spent face to face discussing the patient's condition, in preparation for the discussion, and coordinating the patient's care.   The above documentation reflects my direct findings during this shared patient visit. Please see the separate note by Dr. Lisbeth Renshaw on this date for the remainder of the patient's plan of care.    Carola Rhine, [facility]   **Disclaimer: This note was dictated with voice recognition software. Similar sounding words can inadvertently be transcribed and this note may contain transcription errors which may not have been corrected upon publication of note.**

## 2022-08-04 ENCOUNTER — Ambulatory Visit: Payer: 59 | Admitting: Radiation Oncology

## 2022-08-04 ENCOUNTER — Ambulatory Visit: Payer: 59

## 2022-08-04 ENCOUNTER — Telehealth: Payer: Self-pay | Admitting: Radiation Oncology

## 2022-08-04 NOTE — Telephone Encounter (Signed)
Unable to leave message @10 :40 AM and 3:30 PM for patient to call back to reschedule today's missed appointment. Will try again tomorrow.

## 2022-08-06 ENCOUNTER — Ambulatory Visit: Payer: Medicaid Other | Admitting: Adult Health

## 2022-08-09 NOTE — Progress Notes (Signed)
Thoracic Location of Tumor / Histology: Left Upper Lobe  Patient presented   CT Chest 07/13/2022: The left lung has a soft tissue nodule in the left upper lobe which previously was cavitary and measure 2.2 x 1.9 cm. This was hypermetabolic on the PET-CT scan. Today the lesion measures 2.4 by 1.6 cm.  PET 04/08/2022: The enlarging cavitary lesion at the left apex demonstrates mild hypermetabolic activity, suspicious for adenocarcinoma.  No evidence of metastatic disease.  Small hypermetabolic nodule in the peripheral left parotid lobe, nonspecific, although likely a small incidental salivary gland neoplasm.   Biopsies of    Tobacco/Marijuana/Snuff/ETOH use:   Past/Anticipated interventions by cardiothoracic surgery, if any:   Past/Anticipated interventions by medical oncology, if any:   Signs/Symptoms Weight changes, if any:  Respiratory complaints, if any: Has COPD at baseline, wears oxygen 4-5 liters. Hemoptysis, if any:  Pain issues, if any:    SAFETY ISSUES: Prior radiation?  Pacemaker/ICD?   Possible current pregnancy? Postmenopausal Is the patient on methotrexate?   Current Complaints / other details:

## 2022-08-10 ENCOUNTER — Other Ambulatory Visit: Payer: Self-pay

## 2022-08-10 ENCOUNTER — Ambulatory Visit
Admission: RE | Admit: 2022-08-10 | Discharge: 2022-08-10 | Disposition: A | Discharge status: Home / Self Care | Payer: 59 | Source: Ambulatory Visit | Attending: Radiation Oncology | Admitting: Radiation Oncology

## 2022-08-10 ENCOUNTER — Encounter: Payer: Self-pay | Admitting: Radiation Oncology

## 2022-08-10 VITALS — Ht 64.0 in | Wt 202.0 lb

## 2022-08-10 DIAGNOSIS — C3412 Malignant neoplasm of upper lobe, left bronchus or lung: Secondary | ICD-10-CM

## 2022-08-10 NOTE — Progress Notes (Signed)
Radiation Oncology         [phone] ________________________________  Initial Outpatient Consultation - Conducted via telephone at patient request.  I spoke with the patient to conduct this consult visit via telephone. The patient was notified in advance and was offered an in person or telemedicine meeting to allow for face to face communication but instead preferred to proceed with a telephone consult.   Name: [name]        MRN: [id number]  Date of Service: 08/10/2022 DOB: 11/02/1955  QB:[id number], Ferdinand Lango, NP  Rigoberto Noel, MD     REFERRING PHYSICIAN: Rigoberto Noel, MD   DIAGNOSIS: The encounter diagnosis was Malignant neoplasm of upper lobe of left lung ([place]).   HISTORY OF PRESENT ILLNESS: [name] is a 67 y.o. female seen at the request of Dr. Elsworth Soho for a Putative lung cancer.  The patient was being followed in the lung cancer screening clinic and found to have a pulmonary nodule.  This was noted to be cavitary solid and 2.7 cm in the left upper lobe.  It had significantly increased in size since March 2021 CT which was the comparison.  No evidence of adenopathy was appreciated.  Focal soft tissue asymmetry in the upper outer left breast was noted but not significantly changed.  She underwent a PET scan on 04/08/2022 to follow-up on this which showed the enlarging lesion in the left apex showing mild hypermetabolic activity without evidence of metastatic disease.  She also had a mammogram for diagnostic purposes on 04/22/2022 that showed no mammographic finding of malignant concerns but she had some stable fibroglandular tissue that is been considered benign and stable.  She had a CT scan repeated on 07/13/2022 that showed healing left-sided rib fractures, and persistent left upper lobe nodule measuring 2.4 cm.  There was a soft tissue nodule in the left hepatic lobe measuring 2.1 cm that has been present and stable since 2016.  It appears that the  patient has an issue with transportation and also polysubstance abuse as well as bipolar disorder and has missed several appointments in the pulmonary clinic hence the delay in her workup and ultimately visit today.  She has been meet with [place] to consider definitive stereotactic body radiotherapy without tissue.    PREVIOUS RADIATION THERAPY: No   PAST MEDICAL HISTORY:  Past Medical History:  Diagnosis Date   Anemia    Anxiety    Arthritis    Bipolar 1 disorder (HCC)    CHF (congestive heart failure) (HCC)    COPD (chronic obstructive pulmonary disease) (HCC)    Depression    Diabetes mellitus without complication (HCC)    Dyspnea    Emphysema of lung (HCC)    GERD (gastroesophageal reflux disease)    Headache    migraines   History of hiatal hernia    History of kidney stones    Hyperlipidemia    Hypertension    Neuromuscular disorder (HCC)    neuropathy   On home oxygen therapy    Pneumonia 2015, 2019   Tracheomalacia    Vaginal Pap smear, abnormal        PAST SURGICAL HISTORY: Past Surgical History:  Procedure Laterality Date   CATARACT EXTRACTION W/PHACO Left 01/14/2020   Procedure: CATARACT EXTRACTION PHACO AND INTRAOCULAR LENS PLACEMENT LEFT EYE;  Surgeon: Baruch Goldmann, MD;  Location: [facility];  Service: Ophthalmology;  Laterality: Left;  CDE: 8.18   CATARACT EXTRACTION W/PHACO Right 02/01/2020   Procedure:  CATARACT EXTRACTION PHACO AND INTRAOCULAR LENS PLACEMENT RIGHT EYE;  Surgeon: Baruch Goldmann, MD;  Location: [facility];  Service: Ophthalmology;  Laterality: Right;  CDE: 9.94   CHEILECTOMY Right 01/28/2020   Procedure: KELLER BUNION IMPLANT RIGHT FOOT CHEILECTOMY RIGHT ROOT;  Surgeon: Landis Martins, DPM;  Location: [facility];  Service: Podiatry;  Laterality: Right;  BLOCK   CHOLECYSTECTOMY     COLONOSCOPY  July 2010   Dr. Arnoldo Morale: 3 rectal polyps, not enough tissue for pathologic examination, recommended surveillance in 3 years   COLONOSCOPY N/A 10/23/2014   RMR:  Multiple colonic polyps removed as described above. No endoscopic explaniation for abdominal pain. however. next tcs 10/2019   ESOPHAGOGASTRODUODENOSCOPY  July 2010   Dr. Arnoldo Morale: gastritis and duodenitis, H.pylori negative   ESOPHAGOGASTRODUODENOSCOPY N/A 10/23/2014   RMR: Normal EGD. Status post passage of a Maloney dilator. Today's finding s would not explain abdominal pain   EYE SURGERY Left 01/14/2020   cataract removal   GANGLION CYST EXCISION Left 09/07/2018   Procedure: REMOVAL GANGLION OF WRIST;  Surgeon: Carole Civil, MD;  Location: [facility];  Service: Orthopedics;  Laterality: Left;   HERNIA REPAIR     Dr. Arnoldo Morale   KIDNEY STONE SURGERY     MALONEY DILATION N/A 10/23/2014   Procedure: Venia Minks DILATION;  Surgeon: Daneil Dolin, MD;  Location: [facility];  Service: Endoscopy;  Laterality: N/A;   OPEN REDUCTION INTERNAL FIXATION (ORIF) DISTAL RADIAL FRACTURE Right 12/09/2015   Procedure: OPEN REDUCTION INTERNAL FIXATION (ORIF) RIGHT DISTAL RADIUS;  Surgeon: Leanora Cover, MD;  Location: [facility];  Service: Orthopedics;  Laterality: Right;     FAMILY HISTORY:  Family History  Adopted: Yes     SOCIAL HISTORY:  reports that she has been smoking cigarettes. She started smoking about 55 years ago. She has a 104.00 pack-year smoking history. She has never used smokeless tobacco. She reports current alcohol use of about 1.0 standard drink of alcohol per week. She reports that she does not currently use drugs after having used the following drugs: Cocaine. The patient is married and lives in [place].   ALLERGIES: Penicillins and Septra [sulfamethoxazole-trimethoprim]   MEDICATIONS:  Current Outpatient Medications  Medication Sig Dispense Refill   Accu-Chek Softclix Lancets lancets Use as instructed to monitor glucose twice daily 100 each 12   acetaminophen (TYLENOL) 500 MG tablet Take 1 tablet (500 mg total) by mouth every 6 (six) hours as needed. (Patient  taking differently: Take 1,000 mg by mouth every 6 (six) hours as needed.) 30 tablet 0   albuterol (PROVENTIL) (2.5 MG/3ML) 0.083% nebulizer solution INHALE 1 VIAL VIA NEBULIZER EVERY 4 HOURS 360 mL 2   albuterol (VENTOLIN HFA) 108 (90 Base) MCG/ACT inhaler INHALE 1 OR 2 PUFFS BY MOUTH EVERY 6 HOURS AS NEEDED FOR WHEEZING OR SHORTNESS OF BREATH 8.5 g 5   aspirin EC 81 MG tablet Take 1 tablet (81 mg total) by mouth daily. Swallow whole. 30 tablet 2   benzonatate (TESSALON) 200 MG capsule Take 1 capsule (200 mg total) by mouth 3 (three) times daily as needed. 45 capsule 1   Budeson-Glycopyrrol-Formoterol (BREZTRI AEROSPHERE) 160-9-4.8 MCG/ACT AERO Inhale 2 puffs into the lungs 2 (two) times daily. 10.7 g 1   cetirizine (ZYRTEC) 10 MG tablet Take 10 mg by mouth daily as needed for allergies.      clopidogrel (PLAVIX) 75 MG tablet Take 1 tablet (75 mg total) by mouth daily. 30 tablet 2   clotrimazole (MYCELEX) 10  MG troche Take 1 tablet (10 mg total) by mouth 5 (five) times daily. 35 Troche 0   cycloSPORINE (RESTASIS) 0.05 % ophthalmic emulsion Place 1 drop into both eyes 2 (two) times daily as needed (dry eyes).     doxycycline (VIBRA-TABS) 100 MG tablet Take 1 tablet (100 mg total) by mouth 2 (two) times daily. 14 tablet 0   DULoxetine (CYMBALTA) 20 MG capsule Take 20 mg by mouth daily.     gabapentin (NEURONTIN) 800 MG tablet Take 1 tablet (800 mg total) by mouth 3 (three) times daily as needed.     glucose blood (ACCU-CHEK GUIDE) test strip Use as instructed to monitor glucose twice daily 100 each 12   ipratropium (ATROVENT) 0.02 % nebulizer solution Take 2.5 mLs by nebulization every 4 (four) hours as needed for wheezing or shortness of breath.     Ipratropium-Albuterol (COMBIVENT RESPIMAT) 20-100 MCG/ACT AERS respimat INHALE 1 PUFF INTO THE LUNGS EVERY (4) HOURS. 4 g 5   metFORMIN (GLUCOPHAGE) 1000 MG tablet Take 1 tablet (1,000 mg total) by mouth 2 (two) times daily. 180 tablet 3   nitroGLYCERIN  (NITROSTAT) 0.4 MG SL tablet Place under the tongue.     olmesartan-hydrochlorothiazide (BENICAR HCT) 20-12.5 MG tablet TAKE (1) TABLET BY MOUTH DAILY 30 tablet 0   ondansetron (ZOFRAN-ODT) 4 MG disintegrating tablet Take 4 mg by mouth 2 (two) times daily as needed for nausea/vomiting.     pantoprazole (PROTONIX) 40 MG tablet Take 40 mg by mouth daily.     predniSONE (DELTASONE) 10 MG tablet Take 4 tablets (40 mg total) by mouth daily with breakfast for 4 days, THEN 3 tablets (30 mg total) daily with breakfast for 4 days, THEN 2 tablets (20 mg total) daily with breakfast for 4 days, THEN 1 tablet (10 mg total) daily with breakfast for 4 days. 40 tablet 0   rOPINIRole (REQUIP) 1 MG tablet Take 1 mg by mouth 3 (three) times daily.      rosuvastatin (CRESTOR) 10 MG tablet Take 1 tablet (10 mg total) by mouth daily. 90 tablet 3   SUMAtriptan (IMITREX) 25 MG tablet Take 25 mg by mouth every 2 (two) hours as needed for migraine.     thiamine 100 MG tablet Take 1 tablet (100 mg total) by mouth daily. 30 tablet 1   torsemide (DEMADEX) 20 MG tablet Take 1 tablet (20 mg total) by mouth daily as needed. May take an additional 20 mg daily as needed for extreme swelling. 30 tablet 0   No current facility-administered medications for this encounter.     REVIEW OF SYSTEMS: On review of systems, the patient reports that  she is doing okay and continues O2 at 2-3 L. She denies any significant changes in her breathing. She reports she is doing fair, and has had some back pain from her rib fractures. She reports she is not having any hemoptysis. She acknowledges being clean from drugs for about 9 months. She lives at home with her husband and can get out and about but has limited transportation. No other complaints are verbalized.      PHYSICAL EXAM:  Wt Readings from Last 3 Encounters:  08/10/22 202 lb (91.6 kg)  07/26/22 201 lb 9.6 oz (91.4 kg)  05/31/22 211 lb 6.4 oz (95.9 kg)     Pain Assessment Pain  Score: 7  Pain Loc: Back (Right Back)/10   Unable to assess due to encounter type.   ECOG = 1  0 - Asymptomatic (  Fully active, able to carry on all predisease activities without restriction)  1 - Symptomatic but completely ambulatory (Restricted in physically strenuous activity but ambulatory and able to carry out work of a light or sedentary nature. For example, light housework, office work)  2 - Symptomatic, <50% in bed during the day (Ambulatory and capable of all self care but unable to carry out any work activities. Up and about more than 50% of waking hours)  3 - Symptomatic, >50% in bed, but not bedbound (Capable of only limited self-care, confined to bed or chair 50% or more of waking hours)  4 - Bedbound (Completely disabled. Cannot carry on any self-care. Totally confined to bed or chair)  5 - Death   Eustace Pen MM, Creech RH, Tormey DC, et al. [phone]). "Toxicity and response criteria of the [facility]". [place] Oncol. 5 (6): 649-55    LABORATORY DATA:  Lab Results  Component Value Date   WBC 16.4 (H) 03/30/2022   HGB 11.5 (L) 03/30/2022   HCT 34.8 (L) 03/30/2022   MCV 95.8 03/30/2022   PLT 308.0 03/30/2022   Lab Results  Component Value Date   NA 131 (L) 03/30/2022   K 4.3 03/30/2022   CL 89 (L) 03/30/2022   CO2 36 (H) 03/30/2022   Lab Results  Component Value Date   ALT 27 01/02/2022   AST 32 01/02/2022   ALKPHOS 75 01/02/2022   BILITOT 0.3 01/02/2022      RADIOGRAPHY: CT Chest Wo Contrast  Addendum Date: 07/13/2022   ADDENDUM REPORT: 07/13/2022 18:18 ADDENDUM: Additional impression: Healing new anterior left-sided rib fractures from October 2023. Electronically Signed   By: Jill Side M.D.   On: 07/13/2022 18:18   Result Date: 07/13/2022 CLINICAL DATA:  Chronic cough.  COPD.  Lung nodule EXAM: CT CHEST WITHOUT CONTRAST TECHNIQUE: Multidetector CT imaging of the chest was performed following the standard protocol without IV  contrast. RADIATION DOSE REDUCTION: This exam was performed according to the departmental dose-optimization program which includes automated exposure control, adjustment of the mA and/or kV according to patient size and/or use of iterative reconstruction technique. COMPARISON:  PET-CT 04/08/2022.  Chest CT 03/22/2022 and older FINDINGS: Cardiovascular: Normal caliber thoracic aorta on this noncontrast examination with some vascular calcifications. Coronary artery calcifications are seen. The heart is nonenlarged. No significant pericardial effusion. Mediastinum/Nodes: Normal caliber thoracic aorta. The lumen is slightly patulous with air. No specific abnormal lymph node enlargement present in the axillary region, hilum or mediastinum on this noncontrast exam. A few small mediastinal nodes are seen, nonpathologic by size criteria and unchanged from previous. Nodular breast tissue again seen in the left side. Please correlate with prior workup and dedicated screening mammography. Lungs/Pleura: The right lung in the upper lobe has some peribronchial thickening with some interstitial changes in trace ground-glass. Minimal changes in the right lower lobe. There is some dependent atelectasis as well the right lung base. No right-sided effusion or consolidation. The left lung has a soft tissue nodule in the left upper lobe which previously was cavitary and measure 2.2 x 1.9 cm. This was hypermetabolic on the PET-CT scan. Today lesion on series 4, image 32 measures 2.4 by 1.6 cm. There is some peribronchial thickening of the left lung with some subtle ground-glass and bronchiectasis particularly in the lower lobe and central lingula. No pneumothorax or effusion on the left lung. Upper Abdomen: The adrenal glands are preserved. There is a soft tissue nodule identified anterior to left  hepatic lobe and along the anterior abdominal wall are midline on series 2, image 144 measuring 2.1 x 1.5 cm. This has been present since a CT  scan of the abdomen and pelvis from 2016. Musculoskeletal: Diffuse degenerative changes of the spine with some curvature. Healing anterior left-sided rib fractures are identified. These are new from the prior PET-CT scan of October 2023. IMPRESSION: Left upper lobe lesion is now less cavitary than previous. Similar in size when adjusting for technique. Aggressive lesion is still possible. Please correlate for any prior workup or additional evaluation when appropriate. Few scattered areas bilaterally of peribronchial thickening, ground-glass and bronchiectasis. Appearance is similar to prior. Stable nodular left-sided breast parenchyma which is asymmetric. Please correlate with with screening mammography either prior or follow-up. Aortic Atherosclerosis (ICD10-I70.0). Electronically Signed: By: Jill Side M.D. On: 07/13/2022 18:09       IMPRESSION/PLAN: 1. Putative Stage IA3, cT1cN0M0, NSCLC of the LUL. Dr. Lisbeth Renshaw discusses the imaging findings, and reviews that the nodule seen is most likely malignant. We discussed the nature of early stage lung cancer.  Dr. Lisbeth Renshaw reviews that the standard of care is for surgical resection. However for patients who are not medical candidates to undergo surgery, or who choose to forgo surgery, stereotactic body radiotherapy (SBRT) is an appropriate alternative. We also discussed the limits in lack of tissue confirmation. The patient is in agreement after reviewing the options to treat without tissue diagnosis. We discussed the risks, benefits, short, and long term effects of radiotherapy, as well as the curative intent, and the patient is interested in proceeding. Dr. Lisbeth Renshaw discusses the delivery and logistics of radiotherapy and anticipates a course of 3-5 fractions  of radiotherapy. The patient will be contacted to coordinate treatment planning by our simulation department. She will sign written consent at that time to proceed.   This encounter was conducted via telephone.   The patient has provided two factor identification and has given verbal consent for this type of encounter and has been advised to only accept a meeting of this type in a secure network environment. The time spent during this encounter was 60 minutes including preparation, discussion, and coordination of the patient's care. The attendants for this meeting include Blenda Nicely, RN, Dr. Lisbeth Renshaw, Hayden Pedro  and Joesph Fillers.  During the encounter,  Blenda Nicely, RN, Dr. Lisbeth Renshaw, and Hayden Pedro were located at [facility] Radiation Oncology Department.  [name] was located at home.    The above documentation reflects my direct findings during this shared patient visit. Please see the separate note by Dr. Lisbeth Renshaw on this date for the remainder of the patient's plan of care.    Carola Rhine, [facility]   **Disclaimer: This note was dictated with voice recognition software. Similar sounding words can inadvertently be transcribed and this note may contain transcription errors which may not have been corrected upon publication of note.**

## 2022-08-11 ENCOUNTER — Telehealth: Payer: Self-pay | Admitting: Radiation Oncology

## 2022-08-11 NOTE — Telephone Encounter (Signed)
2/7 @ 11:48 am called to follow up with Dr. Lisbeth Renshaw, she stated she had a missed call, but no one left message. No notes was left in her chart from today.

## 2022-08-17 ENCOUNTER — Ambulatory Visit: Payer: Medicare Other | Admitting: Internal Medicine

## 2022-08-23 ENCOUNTER — Ambulatory Visit: Payer: 59 | Admitting: Pulmonary Disease

## 2022-08-24 ENCOUNTER — Ambulatory Visit: Payer: 59 | Admitting: Radiation Oncology

## 2022-08-25 ENCOUNTER — Telehealth: Payer: Self-pay | Admitting: *Deleted

## 2022-08-25 ENCOUNTER — Inpatient Hospital Stay: Payer: 59 | Attending: Adult Health

## 2022-08-25 ENCOUNTER — Other Ambulatory Visit: Payer: Self-pay

## 2022-08-25 ENCOUNTER — Ambulatory Visit
Admission: RE | Admit: 2022-08-25 | Discharge: 2022-08-25 | Disposition: A | Discharge status: Home / Self Care | Payer: 59 | Source: Ambulatory Visit | Attending: Radiation Oncology | Admitting: Radiation Oncology

## 2022-08-25 DIAGNOSIS — Z510 Encounter for antineoplastic radiation therapy: Secondary | ICD-10-CM | POA: Diagnosis not present

## 2022-08-25 DIAGNOSIS — C3412 Malignant neoplasm of upper lobe, left bronchus or lung: Secondary | ICD-10-CM | POA: Insufficient documentation

## 2022-08-25 MED ORDER — NYSTATIN 100000 UNIT/ML MT SUSP: 5.0000 mL | Freq: Two times a day (BID) | OROMUCOSAL | 0 refills | Status: DC

## 2022-08-25 NOTE — Telephone Encounter (Signed)
Patient has an appt 10/08/22.  ATC and line was busy.  Sent in medication to [facility]

## 2022-08-26 DIAGNOSIS — C3412 Malignant neoplasm of upper lobe, left bronchus or lung: Secondary | ICD-10-CM | POA: Diagnosis not present

## 2022-08-27 ENCOUNTER — Ambulatory Visit: Payer: Self-pay | Admitting: Cardiology

## 2022-09-07 ENCOUNTER — Ambulatory Visit
Admission: RE | Admit: 2022-09-07 | Discharge: 2022-09-07 | Disposition: A | Discharge status: Home / Self Care | Payer: 59 | Source: Ambulatory Visit | Attending: Radiation Oncology | Admitting: Radiation Oncology

## 2022-09-07 ENCOUNTER — Other Ambulatory Visit: Payer: Self-pay

## 2022-09-07 DIAGNOSIS — C3412 Malignant neoplasm of upper lobe, left bronchus or lung: Secondary | ICD-10-CM | POA: Insufficient documentation

## 2022-09-07 DIAGNOSIS — Z510 Encounter for antineoplastic radiation therapy: Secondary | ICD-10-CM | POA: Insufficient documentation

## 2022-09-07 LAB — RAD ONC ARIA SESSION SUMMARY
Course Elapsed Days: 0
Plan Fractions Treated to Date: 1
Plan Prescribed Dose Per Fraction: 12 Gy
Plan Total Fractions Prescribed: 5
Plan Total Prescribed Dose: 60 Gy
Reference Point Dosage Given to Date: 12 Gy
Reference Point Session Dosage Given: 12 Gy
Session Number: 1

## 2022-09-08 ENCOUNTER — Ambulatory Visit: Payer: 59 | Admitting: Radiation Oncology

## 2022-09-09 ENCOUNTER — Inpatient Hospital Stay: Payer: 59 | Attending: Adult Health

## 2022-09-09 ENCOUNTER — Other Ambulatory Visit: Payer: Self-pay

## 2022-09-09 ENCOUNTER — Ambulatory Visit
Admission: RE | Admit: 2022-09-09 | Discharge: 2022-09-09 | Disposition: A | Discharge status: Home / Self Care | Payer: 59 | Source: Ambulatory Visit | Attending: Radiation Oncology | Admitting: Radiation Oncology

## 2022-09-09 DIAGNOSIS — C3412 Malignant neoplasm of upper lobe, left bronchus or lung: Secondary | ICD-10-CM | POA: Diagnosis not present

## 2022-09-09 LAB — RAD ONC ARIA SESSION SUMMARY
Course Elapsed Days: 2
Plan Fractions Treated to Date: 2
Plan Prescribed Dose Per Fraction: 12 Gy
Plan Total Fractions Prescribed: 5
Plan Total Prescribed Dose: 60 Gy
Reference Point Dosage Given to Date: 24 Gy
Reference Point Session Dosage Given: 12 Gy
Session Number: 2

## 2022-09-10 ENCOUNTER — Ambulatory Visit: Payer: 59 | Admitting: Radiation Oncology

## 2022-09-13 ENCOUNTER — Inpatient Hospital Stay: Payer: 59

## 2022-09-13 ENCOUNTER — Telehealth: Payer: Self-pay | Admitting: Radiation Oncology

## 2022-09-13 ENCOUNTER — Other Ambulatory Visit: Payer: Self-pay

## 2022-09-13 ENCOUNTER — Ambulatory Visit
Admission: RE | Admit: 2022-09-13 | Discharge: 2022-09-13 | Disposition: A | Discharge status: Home / Self Care | Payer: 59 | Source: Ambulatory Visit | Attending: Radiation Oncology | Admitting: Radiation Oncology

## 2022-09-13 DIAGNOSIS — C3412 Malignant neoplasm of upper lobe, left bronchus or lung: Secondary | ICD-10-CM | POA: Diagnosis not present

## 2022-09-13 LAB — RAD ONC ARIA SESSION SUMMARY
Course Elapsed Days: 6
Plan Fractions Treated to Date: 3
Plan Prescribed Dose Per Fraction: 12 Gy
Plan Total Fractions Prescribed: 5
Plan Total Prescribed Dose: 60 Gy
Reference Point Dosage Given to Date: 36 Gy
Reference Point Session Dosage Given: 12 Gy
Session Number: 3

## 2022-09-13 NOTE — Telephone Encounter (Signed)
Pt called and advised her transportation had not arrived as scheduled. I reached out to [place], Solicitor, who advised taxi service would not be arriving. Darrick Meigs was able to set up alternative transport but advised pt would be running late for appt. Pt was relayed this information with details of transport. Radiation treatment team was also notified of delay. All parties verbalized understanding.

## 2022-09-15 ENCOUNTER — Inpatient Hospital Stay: Payer: 59

## 2022-09-15 ENCOUNTER — Other Ambulatory Visit: Payer: Self-pay

## 2022-09-15 ENCOUNTER — Other Ambulatory Visit: Payer: Self-pay | Admitting: Pulmonary Disease

## 2022-09-15 ENCOUNTER — Ambulatory Visit
Admission: RE | Admit: 2022-09-15 | Discharge: 2022-09-15 | Disposition: A | Discharge status: Home / Self Care | Payer: 59 | Source: Ambulatory Visit | Attending: Radiation Oncology | Admitting: Radiation Oncology

## 2022-09-15 DIAGNOSIS — C3412 Malignant neoplasm of upper lobe, left bronchus or lung: Secondary | ICD-10-CM | POA: Diagnosis not present

## 2022-09-15 LAB — RAD ONC ARIA SESSION SUMMARY
Course Elapsed Days: 8
Plan Fractions Treated to Date: 4
Plan Prescribed Dose Per Fraction: 12 Gy
Plan Total Fractions Prescribed: 5
Plan Total Prescribed Dose: 60 Gy
Reference Point Dosage Given to Date: 48 Gy
Reference Point Session Dosage Given: 12 Gy
Session Number: 4

## 2022-09-16 ENCOUNTER — Other Ambulatory Visit: Payer: Self-pay | Admitting: Radiation Oncology

## 2022-09-16 DIAGNOSIS — C3412 Malignant neoplasm of upper lobe, left bronchus or lung: Secondary | ICD-10-CM

## 2022-09-17 ENCOUNTER — Inpatient Hospital Stay: Payer: 59

## 2022-09-17 ENCOUNTER — Other Ambulatory Visit: Payer: Self-pay | Admitting: Adult Health

## 2022-09-17 ENCOUNTER — Ambulatory Visit: Payer: 59 | Admitting: Radiation Oncology

## 2022-09-17 ENCOUNTER — Other Ambulatory Visit: Payer: Self-pay | Admitting: Pulmonary Disease

## 2022-09-17 ENCOUNTER — Ambulatory Visit: Payer: 59

## 2022-09-17 NOTE — Telephone Encounter (Signed)
Please advise on refill request

## 2022-09-18 ENCOUNTER — Other Ambulatory Visit: Payer: Self-pay | Admitting: Pulmonary Disease

## 2022-09-21 ENCOUNTER — Ambulatory Visit
Admission: RE | Admit: 2022-09-21 | Discharge: 2022-09-21 | Disposition: A | Discharge status: Home / Self Care | Payer: 59 | Source: Ambulatory Visit | Attending: Radiation Oncology | Admitting: Radiation Oncology

## 2022-09-21 ENCOUNTER — Inpatient Hospital Stay: Payer: 59

## 2022-09-21 ENCOUNTER — Other Ambulatory Visit: Payer: Self-pay | Admitting: Radiation Oncology

## 2022-09-21 ENCOUNTER — Other Ambulatory Visit: Payer: Self-pay

## 2022-09-21 DIAGNOSIS — N3001 Acute cystitis with hematuria: Secondary | ICD-10-CM | POA: Diagnosis not present

## 2022-09-21 DIAGNOSIS — A4151 Sepsis due to Escherichia coli [E. coli]: Secondary | ICD-10-CM | POA: Diagnosis not present

## 2022-09-21 LAB — RAD ONC ARIA SESSION SUMMARY
Course Elapsed Days: 14
Plan Fractions Treated to Date: 5
Plan Prescribed Dose Per Fraction: 12 Gy
Plan Total Fractions Prescribed: 5
Plan Total Prescribed Dose: 60 Gy
Reference Point Dosage Given to Date: 60 Gy
Reference Point Session Dosage Given: 12 Gy
Session Number: 5

## 2022-09-21 MED ORDER — HYDROMORPHONE HCL 4 MG PO TABS: 4.0000 mg | ORAL_TABLET | Freq: Four times a day (QID) | ORAL | 0 refills | Status: DC | PRN

## 2022-09-22 NOTE — Progress Notes (Deleted)
Cardiology Office Note   Date:  09/22/2022   ID:  Rebekah, Peterson 1955/07/18, MRN [id number]  PCP:  Rebekah Dill, NP  Cardiologist:  Dr. Harl Peterson    No chief complaint on file.     History of Present Illness: [name] is a 67 y.o. female who presents for ***  past medical history of coronary calcifications (by prior CT Imaging with low-risk NST in 2015, NSTEMI in 01/2022 and medical management pursued given her multiple medical issues and substance abuse), HFmrEF (EF 40-45% by echo in 01/2022), HTN, HLD, Type II DM, cocaine use and COPD who presents to the office today for 50-month follow-up.   She was last examined by Rebekah Kicks, NP in 01/2022 following her recent hospitalization for an NSTEMI during which medical management had been recommended with plans for an outpatient cardiac catheterization. She denied any recurrent chest pain and reported her breathing had been at baseline. A cardiac catheterization was recommended for definitive evaluation and she wanted to call back to schedule this. This was scheduled for 7/26 and she was unable to make the appointment and then the catheterization was rescheduled for 8/25 and she no showed for this as well. Dr. Harl Peterson recommended not to reschedule at that time and to arrange for follow-up in the next few months.   In talking with the patient and her husband today, she reports overall feeling well since her last office visit.  She does have dyspnea on exertion in the setting of COPD and is on 4 L nasal cannula at baseline. She denies any recent chest pain or palpitations. No specific orthopnea, PND or pitting edema. Says she was unable to attend prior appointments for her cardiac catheterizations as they rely on [facility] for transportation and the transportation provided by her insurance did not come to their house the day of the procedure.    Followed by pulmonary for lung lesion.  Hx of lt sided rib fx as well.    Past Medical History:  Diagnosis Date   Anemia    Anxiety    Arthritis    Bipolar 1 disorder (HCC)    CHF (congestive heart failure) (HCC)    COPD (chronic obstructive pulmonary disease) (HCC)    Depression    Diabetes mellitus without complication (HCC)    Dyspnea    Emphysema of lung (HCC)    GERD (gastroesophageal reflux disease)    Headache    migraines   History of hiatal hernia    History of kidney stones    Hyperlipidemia    Hypertension    Neuromuscular disorder (HCC)    neuropathy   On home oxygen therapy    Pneumonia 2015, 2019   Tracheomalacia    Vaginal Pap smear, abnormal     Past Surgical History:  Procedure Laterality Date   CATARACT EXTRACTION W/PHACO Left 01/14/2020   Procedure: CATARACT EXTRACTION PHACO AND INTRAOCULAR LENS PLACEMENT LEFT EYE;  Surgeon: Rebekah Goldmann, MD;  Location: [facility];  Service: Ophthalmology;  Laterality: Left;  CDE: 8.18   CATARACT EXTRACTION W/PHACO Right 02/01/2020   Procedure: CATARACT EXTRACTION PHACO AND INTRAOCULAR LENS PLACEMENT RIGHT EYE;  Surgeon: Rebekah Goldmann, MD;  Location: [facility];  Service: Ophthalmology;  Laterality: Right;  CDE: 9.94   CHEILECTOMY Right 01/28/2020   Procedure: KELLER BUNION IMPLANT RIGHT FOOT CHEILECTOMY RIGHT ROOT;  Surgeon: [name], DPM;  Location: [place];  Service: Podiatry;  Laterality: Right;  BLOCK   CHOLECYSTECTOMY  COLONOSCOPY  July 2010   Dr. Arnoldo Peterson: 3 rectal polyps, not enough tissue for pathologic examination, recommended surveillance in 3 years   COLONOSCOPY N/A 10/23/2014   RMR: Multiple colonic polyps removed as described above. No endoscopic explaniation for abdominal pain. however. next tcs 10/2019   ESOPHAGOGASTRODUODENOSCOPY  July 2010   Dr. Arnoldo Peterson: gastritis and duodenitis, H.pylori negative   ESOPHAGOGASTRODUODENOSCOPY N/A 10/23/2014   RMR: Normal EGD. Status post passage of a Maloney dilator. Today's finding s would not explain abdominal pain   EYE SURGERY Left  01/14/2020   cataract removal   GANGLION CYST EXCISION Left 09/07/2018   Procedure: REMOVAL GANGLION OF WRIST;  Surgeon: Rebekah Civil, MD;  Location: [facility];  Service: Orthopedics;  Laterality: Left;   HERNIA REPAIR     Dr. Arnoldo Peterson   KIDNEY STONE SURGERY     MALONEY DILATION N/A 10/23/2014   Procedure: [name] DILATION;  Surgeon: Rebekah Dolin, MD;  Location: [facility];  Service: Endoscopy;  Laterality: N/A;   OPEN REDUCTION INTERNAL FIXATION (ORIF) DISTAL RADIAL FRACTURE Right 12/09/2015   Procedure: OPEN REDUCTION INTERNAL FIXATION (ORIF) RIGHT DISTAL RADIUS;  Surgeon: Rebekah Cover, MD;  Location: [facility];  Service: Orthopedics;  Laterality: Right;     Current Outpatient Medications  Medication Sig Dispense Refill   Accu-Chek Softclix Lancets lancets Use as instructed to monitor glucose twice daily 100 each 12   acetaminophen (TYLENOL) 500 MG tablet Take 1 tablet (500 mg total) by mouth every 6 (six) hours as needed. (Patient taking differently: Take 1,000 mg by mouth every 6 (six) hours as needed.) 30 tablet 0   albuterol (PROVENTIL) (2.5 MG/3ML) 0.083% nebulizer solution INHALE 1 VIAL VIA NEBULIZER EVERY 4 HOURS 360 mL 2   albuterol (VENTOLIN HFA) 108 (90 Base) MCG/ACT inhaler INHALE 1 OR 2 PUFFS BY MOUTH EVERY 6 HOURS AS NEEDED FOR WHEEZING OR SHORTNESS OF BREATH 8.5 g 5   aspirin EC 81 MG tablet Take 1 tablet (81 mg total) by mouth daily. Swallow whole. 30 tablet 2   benzonatate (TESSALON) 200 MG capsule Take 1 capsule (200 mg total) by mouth 3 (three) times daily as needed. 45 capsule 1   Budeson-Glycopyrrol-Formoterol (BREZTRI AEROSPHERE) 160-9-4.8 MCG/ACT AERO Inhale 2 puffs into the lungs 2 (two) times daily. 10.7 g 1   cetirizine (ZYRTEC) 10 MG tablet Take 10 mg by mouth daily as needed for allergies.      clopidogrel (PLAVIX) 75 MG tablet Take 1 tablet (75 mg total) by mouth daily. 30 tablet 2   clotrimazole (MYCELEX) 10 MG troche Take 1 tablet (10 mg  total) by mouth 5 (five) times daily. 35 Troche 0   cycloSPORINE (RESTASIS) 0.05 % ophthalmic emulsion Place 1 drop into both eyes 2 (two) times daily as needed (dry eyes).     doxycycline (VIBRA-TABS) 100 MG tablet Take 1 tablet (100 mg total) by mouth 2 (two) times daily. 14 tablet 0   DULoxetine (CYMBALTA) 20 MG capsule Take 20 mg by mouth daily.     gabapentin (NEURONTIN) 800 MG tablet Take 1 tablet (800 mg total) by mouth 3 (three) times daily as needed.     glucose blood (ACCU-CHEK GUIDE) test strip Use as instructed to monitor glucose twice daily 100 each 12   HYDROmorphone (DILAUDID) 4 MG tablet Take 1 tablet (4 mg total) by mouth every 6 (six) hours as needed for severe pain. 20 tablet 0   ipratropium (ATROVENT) 0.02 % nebulizer solution Take 2.5 mLs  by nebulization every 4 (four) hours as needed for wheezing or shortness of breath.     Ipratropium-Albuterol (COMBIVENT RESPIMAT) 20-100 MCG/ACT AERS respimat INHALE 1 PUFF INTO THE LUNGS EVERY (4) HOURS. 4 g 5   metFORMIN (GLUCOPHAGE) 1000 MG tablet Take 1 tablet (1,000 mg total) by mouth 2 (two) times daily. 180 tablet 3   nitroGLYCERIN (NITROSTAT) 0.4 MG SL tablet Place under the tongue.     nystatin (MYCOSTATIN) 100000 UNIT/ML suspension TAKE 5MLS BY MOUTH TWICE DAILY FOR 7 DAYS. 70 mL 0   olmesartan-hydrochlorothiazide (BENICAR HCT) 20-12.5 MG tablet TAKE (1) TABLET BY MOUTH DAILY 30 tablet 0   ondansetron (ZOFRAN-ODT) 4 MG disintegrating tablet Take 4 mg by mouth 2 (two) times daily as needed for nausea/vomiting.     pantoprazole (PROTONIX) 40 MG tablet Take 40 mg by mouth daily.     rOPINIRole (REQUIP) 1 MG tablet Take 1 mg by mouth 3 (three) times daily.      rosuvastatin (CRESTOR) 10 MG tablet Take 1 tablet (10 mg total) by mouth daily. 90 tablet 3   SUMAtriptan (IMITREX) 25 MG tablet Take 25 mg by mouth every 2 (two) hours as needed for migraine.     thiamine 100 MG tablet Take 1 tablet (100 mg total) by mouth daily. 30 tablet 1    torsemide (DEMADEX) 20 MG tablet Take 1 tablet (20 mg total) by mouth daily as needed. May take an additional 20 mg daily as needed for extreme swelling. 30 tablet 0   No current facility-administered medications for this visit.    Allergies:   Penicillins and Septra [sulfamethoxazole-trimethoprim]    Social History:  The patient  reports that she has been smoking cigarettes. She started smoking about 55 years ago. She has a 104.00 pack-year smoking history. She has never used smokeless tobacco. She reports current alcohol use of about 1.0 standard drink of alcohol per week. She reports that she does not currently use drugs after having used the following drugs: Cocaine.   Family History:  The patient's ***family history is not on file. She was adopted.    ROS:  General:no colds or fevers, no weight changes Skin:no rashes or ulcers HEENT:no blurred vision, no congestion CV:see HPI PUL:see HPI GI:no diarrhea constipation or melena, no indigestion GU:no hematuria, no dysuria MS:no joint pain, no claudication Neuro:no syncope, no lightheadedness Endo:no diabetes, no thyroid disease Wt Readings from Last 3 Encounters:  08/10/22 202 lb (91.6 kg)  07/26/22 201 lb 9.6 oz (91.4 kg)  05/31/22 211 lb 6.4 oz (95.9 kg)     PHYSICAL EXAM: VS:  There were no vitals taken for this visit. , BMI There is no height or weight on file to calculate BMI. General:Pleasant affect, NAD Skin:Warm and dry, brisk capillary refill HEENT:normocephalic, sclera clear, mucus membranes moist Neck:supple, no JVD, no bruits  Heart:S1S2 RRR without murmur, gallup, rub or click Lungs:clear without rales, rhonchi, or wheezes [id number], non tender, + BS, do not palpate liver spleen or masses Ext:no lower ext edema, 2+ pedal pulses, 2+ radial pulses Neuro:alert and oriented, MAE, follows commands, + facial symmetry    EKG:  EKG is ordered today. The ekg ordered today demonstrates ***   Recent Labs: 01/02/2022:  ALT 27 01/05/2022: B Natriuretic Peptide 130.1 01/06/2022: Magnesium 1.8 03/30/2022: BUN 16; Creatinine, Ser 0.82; Hemoglobin 11.5; Platelets 308.0; Potassium 4.3; Sodium 131    Lipid Panel    Component Value Date/Time   CHOL 197 07/18/2015 1113   TRIG 144 07/18/2015  1113   HDL 41 07/18/2015 1113   CHOLHDL 4.8 (H) 07/18/2015 1113   CHOLHDL 3.1 03/07/2014 0343   VLDL 40 03/07/2014 0343   LDLCALC 127 (H) 07/18/2015 1113       Other studies Reviewed: Additional studies/ records that were reviewed today include: ***. chocardiogram: 01/2022 IMPRESSIONS     1. Septal and apical hypokinesis . Left ventricular ejection fraction, by  estimation, is 40 to 45%. The left ventricle has mildly decreased  function. The left ventricle demonstrates regional wall motion  abnormalities (see scoring diagram/findings for  description). The left ventricular internal cavity size was mildly  dilated. Left ventricular diastolic parameters are indeterminate.   2. Right ventricular systolic function is normal. The right ventricular  size is normal.   3. The mitral valve is normal in structure. No evidence of mitral valve  regurgitation.   4. The aortic valve is tricuspid. There is mild calcification of the  aortic valve. Aortic valve regurgitation is not visualized. Aortic valve  sclerosis is present, with no evidence of aortic valve stenosis.       ASSESSMENT AND PLAN:  1.  ***   Current medicines are reviewed with the patient today.  The patient Has no concerns regarding medicines.  The following changes have been made:  See above Labs/ tests ordered today include:see above  Disposition:   FU:  see above  Signed, Rebekah Kicks, NP  09/22/2022 8:37 PM    [facility] [address], [place], [place], [place] Phone: [phone]; Fax: [phone]

## 2022-09-23 ENCOUNTER — Emergency Department (HOSPITAL_COMMUNITY): Payer: 59

## 2022-09-23 ENCOUNTER — Encounter (HOSPITAL_COMMUNITY): Payer: Self-pay | Admitting: Emergency Medicine

## 2022-09-23 ENCOUNTER — Inpatient Hospital Stay (HOSPITAL_COMMUNITY)
Admission: EM | Admit: 2022-09-23 | Discharge: 2022-09-26 | DRG: 871 | Disposition: A | Discharge status: Home Health | Payer: 59 | Attending: Internal Medicine | Admitting: Internal Medicine

## 2022-09-23 ENCOUNTER — Other Ambulatory Visit: Payer: Self-pay

## 2022-09-23 ENCOUNTER — Ambulatory Visit: Payer: 59 | Admitting: Cardiology

## 2022-09-23 DIAGNOSIS — J439 Emphysema, unspecified: Secondary | ICD-10-CM | POA: Diagnosis present

## 2022-09-23 DIAGNOSIS — R6520 Severe sepsis without septic shock: Secondary | ICD-10-CM | POA: Diagnosis present

## 2022-09-23 DIAGNOSIS — F17200 Nicotine dependence, unspecified, uncomplicated: Secondary | ICD-10-CM | POA: Diagnosis present

## 2022-09-23 DIAGNOSIS — G928 Other toxic encephalopathy: Secondary | ICD-10-CM | POA: Diagnosis present

## 2022-09-23 DIAGNOSIS — Z791 Long term (current) use of non-steroidal anti-inflammatories (NSAID): Secondary | ICD-10-CM

## 2022-09-23 DIAGNOSIS — F419 Anxiety disorder, unspecified: Secondary | ICD-10-CM | POA: Diagnosis present

## 2022-09-23 DIAGNOSIS — I471 Supraventricular tachycardia, unspecified: Secondary | ICD-10-CM | POA: Diagnosis present

## 2022-09-23 DIAGNOSIS — Z79899 Other long term (current) drug therapy: Secondary | ICD-10-CM

## 2022-09-23 DIAGNOSIS — A419 Sepsis, unspecified organism: Secondary | ICD-10-CM | POA: Diagnosis present

## 2022-09-23 DIAGNOSIS — Z7902 Long term (current) use of antithrombotics/antiplatelets: Secondary | ICD-10-CM

## 2022-09-23 DIAGNOSIS — R9431 Abnormal electrocardiogram [ECG] [EKG]: Secondary | ICD-10-CM | POA: Diagnosis not present

## 2022-09-23 DIAGNOSIS — C3412 Malignant neoplasm of upper lobe, left bronchus or lung: Secondary | ICD-10-CM | POA: Diagnosis present

## 2022-09-23 DIAGNOSIS — Z6830 Body mass index (BMI) 30.0-30.9, adult: Secondary | ICD-10-CM

## 2022-09-23 DIAGNOSIS — N390 Urinary tract infection, site not specified: Secondary | ICD-10-CM | POA: Diagnosis present

## 2022-09-23 DIAGNOSIS — F1910 Other psychoactive substance abuse, uncomplicated: Secondary | ICD-10-CM | POA: Diagnosis present

## 2022-09-23 DIAGNOSIS — I110 Hypertensive heart disease with heart failure: Secondary | ICD-10-CM | POA: Diagnosis present

## 2022-09-23 DIAGNOSIS — J449 Chronic obstructive pulmonary disease, unspecified: Secondary | ICD-10-CM | POA: Diagnosis present

## 2022-09-23 DIAGNOSIS — Z7984 Long term (current) use of oral hypoglycemic drugs: Secondary | ICD-10-CM

## 2022-09-23 DIAGNOSIS — E669 Obesity, unspecified: Secondary | ICD-10-CM | POA: Diagnosis present

## 2022-09-23 DIAGNOSIS — M1990 Unspecified osteoarthritis, unspecified site: Secondary | ICD-10-CM | POA: Diagnosis present

## 2022-09-23 DIAGNOSIS — F319 Bipolar disorder, unspecified: Secondary | ICD-10-CM | POA: Diagnosis present

## 2022-09-23 DIAGNOSIS — K219 Gastro-esophageal reflux disease without esophagitis: Secondary | ICD-10-CM | POA: Diagnosis present

## 2022-09-23 DIAGNOSIS — E785 Hyperlipidemia, unspecified: Secondary | ICD-10-CM | POA: Diagnosis present

## 2022-09-23 DIAGNOSIS — Z7982 Long term (current) use of aspirin: Secondary | ICD-10-CM

## 2022-09-23 DIAGNOSIS — Z7951 Long term (current) use of inhaled steroids: Secondary | ICD-10-CM

## 2022-09-23 DIAGNOSIS — Z882 Allergy status to sulfonamides status: Secondary | ICD-10-CM

## 2022-09-23 DIAGNOSIS — Z9981 Dependence on supplemental oxygen: Secondary | ICD-10-CM

## 2022-09-23 DIAGNOSIS — E119 Type 2 diabetes mellitus without complications: Secondary | ICD-10-CM | POA: Diagnosis present

## 2022-09-23 DIAGNOSIS — Z1611 Resistance to penicillins: Secondary | ICD-10-CM | POA: Diagnosis present

## 2022-09-23 DIAGNOSIS — R7401 Elevation of levels of liver transaminase levels: Secondary | ICD-10-CM | POA: Diagnosis present

## 2022-09-23 DIAGNOSIS — E860 Dehydration: Secondary | ICD-10-CM | POA: Diagnosis present

## 2022-09-23 DIAGNOSIS — Z1629 Resistance to other single specified antibiotic: Secondary | ICD-10-CM | POA: Diagnosis present

## 2022-09-23 DIAGNOSIS — N3001 Acute cystitis with hematuria: Principal | ICD-10-CM

## 2022-09-23 DIAGNOSIS — E876 Hypokalemia: Secondary | ICD-10-CM | POA: Diagnosis not present

## 2022-09-23 DIAGNOSIS — E871 Hypo-osmolality and hyponatremia: Secondary | ICD-10-CM | POA: Diagnosis present

## 2022-09-23 DIAGNOSIS — I5031 Acute diastolic (congestive) heart failure: Secondary | ICD-10-CM | POA: Diagnosis present

## 2022-09-23 DIAGNOSIS — G2581 Restless legs syndrome: Secondary | ICD-10-CM | POA: Diagnosis present

## 2022-09-23 DIAGNOSIS — A4151 Sepsis due to Escherichia coli [E. coli]: Principal | ICD-10-CM | POA: Diagnosis present

## 2022-09-23 DIAGNOSIS — N179 Acute kidney failure, unspecified: Secondary | ICD-10-CM | POA: Diagnosis present

## 2022-09-23 DIAGNOSIS — Z880 Allergy status to penicillin: Secondary | ICD-10-CM

## 2022-09-23 DIAGNOSIS — F17210 Nicotine dependence, cigarettes, uncomplicated: Secondary | ICD-10-CM | POA: Diagnosis present

## 2022-09-23 DIAGNOSIS — T402X5A Adverse effect of other opioids, initial encounter: Secondary | ICD-10-CM | POA: Diagnosis present

## 2022-09-23 LAB — COMPREHENSIVE METABOLIC PANEL
ALT: 59 U/L — ABNORMAL HIGH (ref 0–44)
AST: 134 U/L — ABNORMAL HIGH (ref 15–41)
Albumin: 3.6 g/dL (ref 3.5–5.0)
Alkaline Phosphatase: 222 U/L — ABNORMAL HIGH (ref 38–126)
Anion gap: 13 (ref 5–15)
BUN: 19 mg/dL (ref 8–23)
CO2: 22 mmol/L (ref 22–32)
Calcium: 10.4 mg/dL — ABNORMAL HIGH (ref 8.9–10.3)
Chloride: 98 mmol/L (ref 98–111)
Creatinine, Ser: 1.46 mg/dL — ABNORMAL HIGH (ref 0.44–1.00)
GFR, Estimated: 39 mL/min — ABNORMAL LOW (ref >60)
Glucose, Bld: 100 mg/dL — ABNORMAL HIGH (ref 70–99)
Potassium: 4 mmol/L (ref 3.5–5.1)
Sodium: 133 mmol/L — ABNORMAL LOW (ref 135–145)
Total Bilirubin: 0.6 mg/dL (ref 0.3–1.2)
Total Protein: 6.7 g/dL (ref 6.5–8.1)

## 2022-09-23 LAB — RAPID URINE DRUG SCREEN, HOSP PERFORMED
Amphetamines: NOT DETECTED
Barbiturates: NOT DETECTED
Benzodiazepines: NOT DETECTED
Cocaine: POSITIVE — AB
Opiates: POSITIVE — AB
Tetrahydrocannabinol: NOT DETECTED

## 2022-09-23 LAB — CBC WITH DIFFERENTIAL/PLATELET
Abs Immature Granulocytes: 0.09 10*3/uL — ABNORMAL HIGH (ref 0.00–0.07)
Basophils Absolute: 0.1 10*3/uL (ref 0.0–0.1)
Basophils Relative: 0 %
Eosinophils Absolute: 0.9 10*3/uL — ABNORMAL HIGH (ref 0.0–0.5)
Eosinophils Relative: 6 %
HCT: 35 % — ABNORMAL LOW (ref 36.0–46.0)
Hemoglobin: 11.7 g/dL — ABNORMAL LOW (ref 12.0–15.0)
Immature Granulocytes: 1 %
Lymphocytes Relative: 10 %
Lymphs Abs: 1.4 10*3/uL (ref 0.7–4.0)
MCH: 31.2 pg (ref 26.0–34.0)
MCHC: 33.4 g/dL (ref 30.0–36.0)
MCV: 93.3 fL (ref 80.0–100.0)
Monocytes Absolute: 1.1 10*3/uL — ABNORMAL HIGH (ref 0.1–1.0)
Monocytes Relative: 8 %
Neutro Abs: 10.9 10*3/uL — ABNORMAL HIGH (ref 1.7–7.7)
Neutrophils Relative %: 75 %
Platelets: 315 10*3/uL (ref 150–400)
RBC: 3.75 MIL/uL — ABNORMAL LOW (ref 3.87–5.11)
RDW: 15 % (ref 11.5–15.5)
WBC: 14.5 10*3/uL — ABNORMAL HIGH (ref 4.0–10.5)
nRBC: 0 % (ref 0.0–0.2)

## 2022-09-23 LAB — URINALYSIS, ROUTINE W REFLEX MICROSCOPIC
Bilirubin Urine: NEGATIVE
Glucose, UA: NEGATIVE mg/dL
Ketones, ur: NEGATIVE mg/dL
Nitrite: POSITIVE — AB
Protein, ur: NEGATIVE mg/dL
Specific Gravity, Urine: 1.008 (ref 1.005–1.030)
pH: 5 (ref 5.0–8.0)

## 2022-09-23 LAB — AMMONIA: Ammonia: 25 umol/L (ref 9–35)

## 2022-09-23 LAB — CBG MONITORING, ED: Glucose-Capillary: 144 mg/dL — ABNORMAL HIGH (ref 70–99)

## 2022-09-23 LAB — GLUCOSE, CAPILLARY
Glucose-Capillary: 145 mg/dL — ABNORMAL HIGH (ref 70–99)
Glucose-Capillary: 117 mg/dL — ABNORMAL HIGH (ref 70–99)

## 2022-09-23 LAB — TSH: TSH: 1.264 u[IU]/mL (ref 0.350–4.500)

## 2022-09-23 LAB — ETHANOL: Alcohol, Ethyl (B): <10 mg/dL (ref <10)

## 2022-09-23 MED ORDER — IPRATROPIUM-ALBUTEROL 0.5-2.5 (3) MG/3ML IN SOLN
3.0000 mL | Freq: Four times a day (QID) | RESPIRATORY_TRACT | Status: DC | PRN
  Administered 2022-09-24: 3 mL via RESPIRATORY_TRACT
  Filled 2022-09-23: qty 3

## 2022-09-23 MED ORDER — SODIUM CHLORIDE 0.9 % IV SOLN: INTRAVENOUS | Status: DC

## 2022-09-23 MED ORDER — PANTOPRAZOLE SODIUM 40 MG PO TBEC
40.0000 mg | DELAYED_RELEASE_TABLET | Freq: Every day | ORAL | Status: DC
  Administered 2022-09-23 – 2022-09-26 (×4): 40 mg via ORAL
  Filled 2022-09-23 (×4): qty 1

## 2022-09-23 MED ORDER — SODIUM CHLORIDE 0.9 % IV SOLN
2.0000 g | Freq: Once | INTRAVENOUS | Status: AC
  Administered 2022-09-23: 2 g via INTRAVENOUS
  Filled 2022-09-23: qty 20

## 2022-09-23 MED ORDER — INSULIN ASPART 100 UNIT/ML IJ SOLN
0.0000 [IU] | INTRAMUSCULAR | Status: DC
  Administered 2022-09-23: 1 [IU] via SUBCUTANEOUS
  Administered 2022-09-24: 2 [IU] via SUBCUTANEOUS
  Administered 2022-09-24: 3 [IU] via SUBCUTANEOUS
  Administered 2022-09-24 – 2022-09-25 (×3): 2 [IU] via SUBCUTANEOUS
  Administered 2022-09-25 (×2): 1 [IU] via SUBCUTANEOUS
  Administered 2022-09-25: 2 [IU] via SUBCUTANEOUS
  Administered 2022-09-26: 1 [IU] via SUBCUTANEOUS
  Administered 2022-09-26: 2 [IU] via SUBCUTANEOUS
  Administered 2022-09-26: 1 [IU] via SUBCUTANEOUS

## 2022-09-23 MED ORDER — CLOPIDOGREL BISULFATE 75 MG PO TABS
75.0000 mg | ORAL_TABLET | Freq: Every day | ORAL | Status: DC
  Administered 2022-09-23 – 2022-09-26 (×4): 75 mg via ORAL
  Filled 2022-09-23 (×4): qty 1

## 2022-09-23 MED ORDER — ACETAMINOPHEN 650 MG RE SUPP: 650.0000 mg | Freq: Four times a day (QID) | RECTAL | Status: DC | PRN

## 2022-09-23 MED ORDER — ONDANSETRON HCL 4 MG PO TABS: 4.0000 mg | ORAL_TABLET | Freq: Four times a day (QID) | ORAL | Status: DC | PRN

## 2022-09-23 MED ORDER — BUDESON-GLYCOPYRROL-FORMOTEROL 160-9-4.8 MCG/ACT IN AERO: 2.0000 | INHALATION_SPRAY | Freq: Two times a day (BID) | RESPIRATORY_TRACT | Status: DC

## 2022-09-23 MED ORDER — CHLORHEXIDINE GLUCONATE CLOTH 2 % EX PADS
6.0000 | MEDICATED_PAD | Freq: Every day | CUTANEOUS | Status: DC
  Administered 2022-09-23 – 2022-09-26 (×4): 6 via TOPICAL

## 2022-09-23 MED ORDER — ACETAMINOPHEN 325 MG PO TABS
650.0000 mg | ORAL_TABLET | Freq: Four times a day (QID) | ORAL | Status: DC | PRN
  Administered 2022-09-24 – 2022-09-26 (×4): 650 mg via ORAL
  Filled 2022-09-23 (×4): qty 2

## 2022-09-23 MED ORDER — HEPARIN SODIUM (PORCINE) 5000 UNIT/ML IJ SOLN
5000.0000 [IU] | Freq: Three times a day (TID) | INTRAMUSCULAR | Status: DC
  Administered 2022-09-23 – 2022-09-26 (×8): 5000 [IU] via SUBCUTANEOUS
  Filled 2022-09-23 (×8): qty 1

## 2022-09-23 MED ORDER — SODIUM CHLORIDE 0.9 % IV BOLUS
1000.0000 mL | Freq: Once | INTRAVENOUS | Status: AC
  Administered 2022-09-23: 1000 mL via INTRAVENOUS

## 2022-09-23 MED ORDER — SODIUM CHLORIDE 0.9 % IV SOLN
2.0000 g | INTRAVENOUS | Status: DC
  Administered 2022-09-24 – 2022-09-25 (×2): 2 g via INTRAVENOUS
  Filled 2022-09-23 (×2): qty 20

## 2022-09-23 MED ORDER — ALBUTEROL SULFATE (2.5 MG/3ML) 0.083% IN NEBU: 3.0000 mL | INHALATION_SOLUTION | Freq: Four times a day (QID) | RESPIRATORY_TRACT | Status: DC | PRN

## 2022-09-23 MED ORDER — NALOXONE HCL 0.4 MG/ML IJ SOLN
0.4000 mg | Freq: Once | INTRAMUSCULAR | Status: AC
  Administered 2022-09-23: 0.4 mg via INTRAVENOUS
  Filled 2022-09-23: qty 1

## 2022-09-23 MED ORDER — ONDANSETRON HCL 4 MG/2ML IJ SOLN: 4.0000 mg | Freq: Four times a day (QID) | INTRAMUSCULAR | Status: DC | PRN

## 2022-09-23 NOTE — H&P (Signed)
History and Physical    [name] [id number] DOB: 09-27-1955 DOA: 09/23/2022  PCP: Rebekah Dill, NP  Patient coming from: Home  Chief Complaint: Altered mental status, fall  HPI: [name] is a 67 y.o. female with medical history significant for severe COPD on 4 L O2, emphysema, LUL lung malignancy undergoing radiation therapy, tobacco use disorder, BPD1, CHF, HTN, and more brought to ED by EMS acutely altered and following fall at home this morning.  Unable to obtain history from patient due to somnolence and slurred speech, with husband no longer at bedside.  History primarily obtained from chart review and ED notes.  Patient struggles to stay awake during the exam and interview and slurs her speech heavily.  Reportedly was started on 2 mg Dilaudid 2 days ago and has been more lethargic with slurred speech since.  Denies hitting head or LOC during fall.  UDS is positive for cocaine, but patient denies using cocaine when asked.  Does follow with radiation oncology for treatment of lung neoplasm, received treatment with SBRT on 3/19.  ED Course: Patient arrived lethargic and somnolent after AM fall, accompanied by husband.  CBG 144, ethanol, and ammonia WNL.  Transaminases and alk phosphatase elevated.  Cr at 1.46 (~0.8 baseline).  CT head and neck without acute intracranial pathology, showing known solid lung lesion.  Given naloxone x1 and patient became more alert, later became more somnolent again.  UDS positive for cocaine and opiates.  UA remarkable for UTI.  Hospitalist team consulted and patient admitted to med tele for suspected sepsis secondary to UTI and AKI.  Started on 2 g CTX Q24h.  Review of Systems: Cannot be obtained due to patient condition.  Past Medical History:  Diagnosis Date   Anemia    Anxiety    Arthritis    Bipolar 1 disorder (HCC)    CHF (congestive heart failure) (HCC)    COPD (chronic obstructive pulmonary disease) (HCC)     Depression    Diabetes mellitus without complication (HCC)    Dyspnea    Emphysema of lung (HCC)    GERD (gastroesophageal reflux disease)    Headache    migraines   History of hiatal hernia    History of kidney stones    Hyperlipidemia    Hypertension    Neuromuscular disorder (HCC)    neuropathy   On home oxygen therapy    Pneumonia 2015, 2019   Tracheomalacia    Vaginal Pap smear, abnormal     Past Surgical History:  Procedure Laterality Date   CATARACT EXTRACTION W/PHACO Left 01/14/2020   Procedure: CATARACT EXTRACTION PHACO AND INTRAOCULAR LENS PLACEMENT LEFT EYE;  Surgeon: Baruch Goldmann, MD;  Location: [facility];  Service: Ophthalmology;  Laterality: Left;  CDE: 8.18   CATARACT EXTRACTION W/PHACO Right 02/01/2020   Procedure: CATARACT EXTRACTION PHACO AND INTRAOCULAR LENS PLACEMENT RIGHT EYE;  Surgeon: Baruch Goldmann, MD;  Location: [facility];  Service: Ophthalmology;  Laterality: Right;  CDE: 9.94   CHEILECTOMY Right 01/28/2020   Procedure: KELLER BUNION IMPLANT RIGHT FOOT CHEILECTOMY RIGHT ROOT;  Surgeon: Landis Martins, DPM;  Location: [place];  Service: Podiatry;  Laterality: Right;  BLOCK   CHOLECYSTECTOMY     COLONOSCOPY  July 2010   Dr. Arnoldo Morale: 3 rectal polyps, not enough tissue for pathologic examination, recommended surveillance in 3 years   COLONOSCOPY N/A 10/23/2014   RMR: Multiple colonic polyps removed as described above. No endoscopic explaniation for abdominal pain. however. next tcs 10/2019  ESOPHAGOGASTRODUODENOSCOPY  July 2010   Dr. Arnoldo Morale: gastritis and duodenitis, H.pylori negative   ESOPHAGOGASTRODUODENOSCOPY N/A 10/23/2014   RMR: Normal EGD. Status post passage of a Maloney dilator. Today's finding s would not explain abdominal pain   EYE SURGERY Left 01/14/2020   cataract removal   GANGLION CYST EXCISION Left 09/07/2018   Procedure: REMOVAL GANGLION OF WRIST;  Surgeon: Carole Civil, MD;  Location: [facility];  Service: Orthopedics;  Laterality: Left;    HERNIA REPAIR     Dr. Arnoldo Morale   KIDNEY STONE SURGERY     MALONEY DILATION N/A 10/23/2014   Procedure: Venia Minks DILATION;  Surgeon: Daneil Dolin, MD;  Location: [facility];  Service: Endoscopy;  Laterality: N/A;   OPEN REDUCTION INTERNAL FIXATION (ORIF) DISTAL RADIAL FRACTURE Right 12/09/2015   Procedure: OPEN REDUCTION INTERNAL FIXATION (ORIF) RIGHT DISTAL RADIUS;  Surgeon: Leanora Cover, MD;  Location: [place];  Service: Orthopedics;  Laterality: Right;     reports that she has been smoking cigarettes. She started smoking about 55 years ago. She has a 104.00 pack-year smoking history. She has never used smokeless tobacco. She reports current alcohol use of about 1.0 standard drink of alcohol per week. She reports that she does not currently use drugs after having used the following drugs: Cocaine.  Allergies  Allergen Reactions   Penicillins Other (See Comments)    Patient is unsure if she allergic to penicillin or septra. Patient states one or another caused "rib pain with a little breathing problem". Has patient had a PCN reaction causing immediate rash, facial/tongue/throat swelling, SOB or lightheadedness with hypotension: YES Has patient had a PCN reaction causing severe rash involving mucus membranes or skin necrosis: NO Has patient had a PCN reaction that required hospitalization: NO Has patient had a PCN reaction occurring within the last 10 years: NO   Septra [Sulfamethoxazole-Trimethoprim] Other (See Comments)    Patient is unsure if she allergic to septra or penicillin. Patient states one or another caused "rib pain with a little breathing problem".    Family History  Adopted: Yes    Prior to Admission medications   Medication Sig Start Date End Date Taking? Authorizing Provider  acetaminophen (TYLENOL) 500 MG tablet Take 1 tablet (500 mg total) by mouth every 6 (six) hours as needed. Patient taking differently: Take 1,000 mg by mouth every 6 (six) hours as  needed. 02/26/21  Yes Stover, Titorya, DPM  albuterol (PROVENTIL) (2.5 MG/3ML) 0.083% nebulizer solution INHALE 1 VIAL VIA NEBULIZER EVERY 4 HOURS 07/14/21  Yes Tanda Rockers, MD  albuterol (VENTOLIN HFA) 108 (90 Base) MCG/ACT inhaler INHALE 1 OR 2 PUFFS BY MOUTH EVERY 6 HOURS AS NEEDED FOR WHEEZING OR SHORTNESS OF BREATH 05/24/22  Yes Parrett, Tammy S, NP  Budeson-Glycopyrrol-Formoterol (BREZTRI AEROSPHERE) 160-9-4.8 MCG/ACT AERO Inhale 2 puffs into the lungs 2 (two) times daily. 01/06/22  Yes Shelly Coss, MD  cetirizine (ZYRTEC) 10 MG tablet Take 10 mg by mouth daily as needed for allergies.  11/02/19  Yes [provider]  clopidogrel (PLAVIX) 75 MG tablet Take 1 tablet (75 mg total) by mouth daily. 01/07/22  Yes Shelly Coss, MD  DULoxetine (CYMBALTA) 20 MG capsule Take 20 mg by mouth daily.   Yes [provider]  gabapentin (NEURONTIN) 800 MG tablet Take 1 tablet (800 mg total) by mouth 3 (three) times daily as needed. 01/06/22  Yes Shelly Coss, MD  HYDROmorphone (DILAUDID) 4 MG tablet Take 1 tablet (4 mg total) by mouth  every 6 (six) hours as needed for severe pain. 09/21/22  Yes Kyung Rudd, MD  hydrOXYzine (VISTARIL) 25 MG capsule Take 25 mg by mouth 3 (three) times daily as needed for anxiety. 09/01/22  Yes [provider]  ibuprofen (ADVIL) 800 MG tablet Take 800 mg by mouth 3 (three) times daily.   Yes [provider]  Ipratropium-Albuterol (COMBIVENT RESPIMAT) 20-100 MCG/ACT AERS respimat INHALE 1 PUFF INTO THE LUNGS EVERY (4) HOURS. 05/19/22  Yes Parrett, Tammy S, NP  meloxicam (MOBIC) 15 MG tablet Take 15 mg by mouth daily.   Yes [provider]  metFORMIN (GLUCOPHAGE) 1000 MG tablet Take 1 tablet (1,000 mg total) by mouth 2 (two) times daily. 05/25/22  Yes Reardon, Juanetta Beets, NP  nitroGLYCERIN (NITROSTAT) 0.4 MG SL tablet Place under the tongue. 09/08/21  Yes [provider]  nystatin (MYCOSTATIN) 100000 UNIT/ML suspension TAKE 5MLS  BY MOUTH TWICE DAILY FOR 7 DAYS. 09/17/22  Yes Rigoberto Noel, MD  OLANZapine (ZYPREXA) 5 MG tablet Take 5 mg by mouth at bedtime. 09/20/22  Yes [provider]  omeprazole (PRILOSEC) 20 MG capsule Take 20 mg by mouth daily.   Yes [provider]  ondansetron (ZOFRAN-ODT) 4 MG disintegrating tablet Take 4 mg by mouth 2 (two) times daily as needed for nausea/vomiting. 11/19/21  Yes [provider]  pantoprazole (PROTONIX) 40 MG tablet Take 40 mg by mouth daily. 02/24/22  Yes [provider]  rOPINIRole (REQUIP) 1 MG tablet Take 2 mg by mouth 3 (three) times daily. 09/12/15  Yes [provider]  rosuvastatin (CRESTOR) 10 MG tablet Take 1 tablet (10 mg total) by mouth daily. 01/25/22  Yes Isaiah Serge, NP  SUMAtriptan (IMITREX) 25 MG tablet Take 25 mg by mouth every 2 (two) hours as needed for migraine. 12/23/20  Yes [provider]  torsemide (DEMADEX) 20 MG tablet Take 1 tablet (20 mg total) by mouth daily as needed. May take an additional 20 mg daily as needed for extreme swelling. 01/06/22  Yes Shelly Coss, MD  Accu-Chek Softclix Lancets lancets Use as instructed to monitor glucose twice daily 05/25/22   Brita Romp, NP  aspirin EC 81 MG tablet Take 1 tablet (81 mg total) by mouth daily. Swallow whole. Patient not taking: Reported on 09/23/2022 01/07/22   Shelly Coss, MD  clotrimazole (MYCELEX) 10 MG troche Take 1 tablet (10 mg total) by mouth 5 (five) times daily. Patient not taking: Reported on 09/23/2022 02/19/22   Parrett, Fonnie Mu, NP  doxycycline (VIBRA-TABS) 100 MG tablet Take 1 tablet (100 mg total) by mouth 2 (two) times daily. Patient not taking: Reported on 09/23/2022 05/31/22   Parrett, Fonnie Mu, NP  glucose blood (ACCU-CHEK GUIDE) test strip Use as instructed to monitor glucose twice daily 05/25/22   Brita Romp, NP  ipratropium (ATROVENT) 0.02 % nebulizer solution Take 2.5 mLs by nebulization every 4 (four) hours as needed  for wheezing or shortness of breath. Patient not taking: Reported on 09/23/2022 12/28/21   [provider]  olmesartan-hydrochlorothiazide (BENICAR HCT) 20-12.5 MG tablet TAKE (1) TABLET BY MOUTH DAILY Patient not taking: Reported on 09/23/2022 05/28/22   Parrett, Fonnie Mu, NP  thiamine 100 MG tablet Take 1 tablet (100 mg total) by mouth daily. Patient not taking: Reported on 09/23/2022 01/07/22   Shelly Coss, MD    Physical Exam: Vitals:   09/23/22 1438 09/23/22 1500 09/23/22 1530 09/23/22 1600  BP: (!) 147/69 (!) 113/98 (!) 153/94 (!) 144/70  Pulse: (!) 124 (!) 122 (!) 128 (!) 121  Resp: (!) 24 (!) 26 (!) 27 (!) 21  Temp:      TempSrc:      SpO2: 100% 100% 98% 100%  Weight:      Height:        Examination: General: Somnolent female,  HEENT: Pupils constricted, but reactive to light bilaterally. Dry mucous membranes. Lungs: Diffuse wheezing and referred upper airway sounds. Tachypneic with increased WOB on 5 L [place].  SpO2 stable. Cardiovascular: Regular rate, tachycardic. Normal S1/S2. No murmurs/rubs/gallops. No JVD. Abdomen: Soft, nontender.  No rebound or guarding.  Normoactive bowel sounds. GU: Tender to palpation of suprapubic region. Extremities: Trace pitting edema bilaterally.  2+ peripheral pulses. Capillary refill 3 seconds. Skin: Warm, dry.  Areas of skin hypopigmentation on arms and dark skin lesion on R hand. Neuro: Alert to self and place, but not date or situation.  Dysarthric speech, significant somnolence (falls asleep repeatedly during interview).  Following instructions intermittently.  Labs on Admission: I have personally reviewed following labs and imaging studies:  CBC: Recent Labs  Lab 09/23/22 1113  WBC 14.5*  NEUTROABS 10.9*  HGB 11.7*  HCT 35.0*  MCV 93.3  PLT 123456   Basic Metabolic Panel: Recent Labs  Lab 09/23/22 1113  NA 133*  K 4.0  CL 98  CO2 22  GLUCOSE 100*  BUN 19  CREATININE 1.46*  CALCIUM 10.4*   GFR: Estimated  Creatinine Clearance: 38.1 mL/min (A) (by C-G formula based on SCr of 1.46 mg/dL (H)). Liver Function Tests: Recent Labs  Lab 09/23/22 1113  AST 134*  ALT 59*  ALKPHOS 222*  BILITOT 0.6  PROT 6.7  ALBUMIN 3.6   No results for input(s): "LIPASE", "AMYLASE" in the last 168 hours. Recent Labs  Lab 09/23/22 1113  AMMONIA 25   Coagulation Profile: No results for input(s): "INR", "PROTIME" in the last 168 hours. Cardiac Enzymes: No results for input(s): "CKTOTAL", "CKMB", "CKMBINDEX", "TROPONINI" in the last 168 hours. BNP (last 3 results) No results for input(s): "PROBNP" in the last 8760 hours. HbA1C: No results for input(s): "HGBA1C" in the last 72 hours. CBG: Recent Labs  Lab 09/23/22 1022  GLUCAP 144*   Lipid Profile: No results for input(s): "CHOL", "HDL", "LDLCALC", "TRIG", "CHOLHDL", "LDLDIRECT" in the last 72 hours. Thyroid Function Tests: No results for input(s): "TSH", "T4TOTAL", "FREET4", "T3FREE", "THYROIDAB" in the last 72 hours. Anemia Panel: No results for input(s): "VITAMINB12", "FOLATE", "FERRITIN", "TIBC", "IRON", "RETICCTPCT" in the last 72 hours. Urine analysis:    Component Value Date/Time   COLORURINE YELLOW 09/23/2022 1226   APPEARANCEUR HAZY (A) 09/23/2022 1226   LABSPEC 1.008 09/23/2022 1226   PHURINE 5.0 09/23/2022 1226   GLUCOSEU NEGATIVE 09/23/2022 1226   HGBUR MODERATE (A) 09/23/2022 1226   BILIRUBINUR NEGATIVE 09/23/2022 1226   BILIRUBINUR negative 08/01/2015 1122   KETONESUR NEGATIVE 09/23/2022 1226   PROTEINUR NEGATIVE 09/23/2022 1226   UROBILINOGEN negative 08/01/2015 1122   UROBILINOGEN 0.2 09/17/2014 2143   NITRITE POSITIVE (A) 09/23/2022 1226   LEUKOCYTESUR LARGE (A) 09/23/2022 1226    Radiological Exams on Admission: DG Pelvis Portable  Result Date: 09/23/2022 CLINICAL DATA:  [place] in bathroom at 0400 hours today, history lung cancer EXAM: PORTABLE PELVIS 1-2 VIEWS COMPARISON:  Portable exam 1137 hours without priors for  comparison FINDINGS: Osseous demineralization. SI joints preserved. Asymmetric narrowing of LEFT hip joint consistent with degenerative changes. RIGHT hip joint space preserved. No acute fracture, dislocation, or bone destruction. Scattered  vascular calcifications. IMPRESSION: Degenerative changes LEFT hip joint with osseous demineralization. No acute abnormalities. Electronically Signed   By: Lavonia Dana M.D.   On: 09/23/2022 12:02   DG Chest Port 1 View  Result Date: 09/23/2022 CLINICAL DATA:  [place] in bathroom at 0400 hours today, no loss of consciousness, history lung cancer EXAM: PORTABLE CHEST 1 VIEW COMPARISON:  Portable exam 1140 hours compared to 01/12/2022 FINDINGS: Normal heart size, mediastinal contours, and pulmonary vascularity. Nodular focus lateral LEFT upper lobe again identified, 15 mm diameter, corresponding to known cavitary lesion on prior CT. Minimal biapical scarring. Lungs otherwise clear. No acute infiltrate, pleural effusion, or pneumothorax. Bones demineralized. IMPRESSION: LEFT upper lobe nodule consistent with known cavitary neoplasm. No acute abnormalities. Electronically Signed   By: Lavonia Dana M.D.   On: 09/23/2022 12:01   CT Head Wo Contrast  Result Date: 09/23/2022 CLINICAL DATA:  Fall EXAM: CT HEAD WITHOUT CONTRAST CT CERVICAL SPINE WITHOUT CONTRAST TECHNIQUE: Multidetector CT imaging of the head and cervical spine was performed following the standard protocol without intravenous contrast. Multiplanar CT image reconstructions of the cervical spine were also generated. RADIATION DOSE REDUCTION: This exam was performed according to the departmental dose-optimization program which includes automated exposure control, adjustment of the mA and/or kV according to patient size and/or use of iterative reconstruction technique. COMPARISON:  CT facial bones 12/12/2016, cervical spine radiographs 10/05/2016 FINDINGS: CT HEAD FINDINGS Brain: There is no acute intracranial hemorrhage,  extra-axial fluid collection, or acute infarct. Parenchymal volume is normal. The ventricles are normal in size. Gray-white differentiation is preserved. The pituitary and suprasellar region are normal. There is no mass lesion. There is no mass effect or midline shift. Vascular: There is calcification of the bilateral carotid siphons and vertebral arteries. Skull: Normal. Negative for fracture or focal lesion. Sinuses/Orbits: The paranasal sinuses are clear. Bilateral lens implants are in place. The globes and orbits are otherwise unremarkable. Other: None. CT CERVICAL SPINE FINDINGS Alignment: There is a reversal of the normal cervical curvature. There is no jumped or perched facet or other evidence of traumatic malalignment. Skull base and vertebrae: Skull base alignment is maintained. Vertebral body heights are preserved. There is no evidence of acute fracture. There is no suspicious osseous lesion. Soft tissues and spinal canal: No prevertebral fluid or swelling. No visible canal hematoma. Disc levels: There is multilevel disc space narrowing and degenerative endplate change, most advanced at C5-C6 and C6-C7. There is multilevel facet arthropathy, most advanced at C2-C3 and C3-C4. There is ossification of the posterior longitudinal ligament at C5-C6 resulting in at least mild-to-moderate spinal canal stenosis. Upper chest: The previously seen solid lesion in the left apex on the CT from 07/13/2022 now demonstrates internal cavitation and is similar in appearance to the more remote prior CT from 03/22/2022. Other: None. IMPRESSION: 1. No acute intracranial pathology. 2. No acute fracture or traumatic malalignment of the cervical spine. 3. The previously seen solid lesion in the left apex now demonstrates internal cavitation, similar in appearance to the more remote CT chest from 03/22/2022. Malignancy remains a possibility. Electronically Signed   By: Valetta Mole M.D.   On: 09/23/2022 11:52   CT Cervical Spine  Wo Contrast  Result Date: 09/23/2022 CLINICAL DATA:  Fall EXAM: CT HEAD WITHOUT CONTRAST CT CERVICAL SPINE WITHOUT CONTRAST TECHNIQUE: Multidetector CT imaging of the head and cervical spine was performed following the standard protocol without intravenous contrast. Multiplanar CT image reconstructions of the cervical spine were also generated. RADIATION DOSE REDUCTION:  This exam was performed according to the departmental dose-optimization program which includes automated exposure control, adjustment of the mA and/or kV according to patient size and/or use of iterative reconstruction technique. COMPARISON:  CT facial bones 12/12/2016, cervical spine radiographs 10/05/2016 FINDINGS: CT HEAD FINDINGS Brain: There is no acute intracranial hemorrhage, extra-axial fluid collection, or acute infarct. Parenchymal volume is normal. The ventricles are normal in size. Gray-white differentiation is preserved. The pituitary and suprasellar region are normal. There is no mass lesion. There is no mass effect or midline shift. Vascular: There is calcification of the bilateral carotid siphons and vertebral arteries. Skull: Normal. Negative for fracture or focal lesion. Sinuses/Orbits: The paranasal sinuses are clear. Bilateral lens implants are in place. The globes and orbits are otherwise unremarkable. Other: None. CT CERVICAL SPINE FINDINGS Alignment: There is a reversal of the normal cervical curvature. There is no jumped or perched facet or other evidence of traumatic malalignment. Skull base and vertebrae: Skull base alignment is maintained. Vertebral body heights are preserved. There is no evidence of acute fracture. There is no suspicious osseous lesion. Soft tissues and spinal canal: No prevertebral fluid or swelling. No visible canal hematoma. Disc levels: There is multilevel disc space narrowing and degenerative endplate change, most advanced at C5-C6 and C6-C7. There is multilevel facet arthropathy, most advanced at  C2-C3 and C3-C4. There is ossification of the posterior longitudinal ligament at C5-C6 resulting in at least mild-to-moderate spinal canal stenosis. Upper chest: The previously seen solid lesion in the left apex on the CT from 07/13/2022 now demonstrates internal cavitation and is similar in appearance to the more remote prior CT from 03/22/2022. Other: None. IMPRESSION: 1. No acute intracranial pathology. 2. No acute fracture or traumatic malalignment of the cervical spine. 3. The previously seen solid lesion in the left apex now demonstrates internal cavitation, similar in appearance to the more remote CT chest from 03/22/2022. Malignancy remains a possibility. Electronically Signed   By: Valetta Mole M.D.   On: 09/23/2022 11:52    Assessment/Plan Principal Problem:   Sepsis ([place]) Active Problems:   COPD GOLD 2/ still smoker    Tobacco use disorder   Hyperlipidemia   Bipolar 1 disorder (HCC)   Anxiety   Polysubstance abuse ([place])  Sepsis Urinary tract infection Patient meeting sepsis criteria with tachycardia, tachypnea, leukocytosis, and evidence of end organ dysfunction.  These findings may be otherwise explained by patient's substance use, but will treat for sepsis presumptively for now and reevaluate as patient becomes more clinically stable. - 2 g CTX daily - f/u urine and blood Cx (blood Cx obtained after Abx initiated) - Daily CBC - Admitted to stepdown ICU  Polysubstance use/polypharmacy UDS positive for opiates as expected in setting of prescription Dilaudid use (prescribed 2 days ago), but also positive for cocaine.  Overuse of Dilaudid would help explain patient's encephalopathy in part, further supported by improvement with naloxone administration.  Moreover, patient has multiple centrally active medicines - Hold gabapentin, meloxicam, duloxetine, olanzapine, Dilaudid due to concern for polypharmacy, consider restarting psych meds when patient more stable  Acute  encephalopathy Likely multifactorial in setting of UTI, respiratory distress, and polysubstance use.  Will CTM. - Delirium precautions  AKI Hyponatremia Cr 1.46 on admission, baseline ~0.6.  Suspect likely etiology is dehydration, though prerenal etiology less supported by normal BUN.  May be end organ dysfunction in setting of sepsis. - 1 L NS bolus - Maintenance NS IVF for 10 hours after bolus finishes - Hold ibuprofen, minimize  renally toxic medications and contrast use - Strict I/Os and daily BMP, check magnesium tomorrow  COPD Emphysema Lungs with wheezing on exam, suspect patient has poor lung aeration at baseline based on extensive emphysema history and pulmonary neoplasm.  Tachypnea more likely related to polypharmacy and sepsis. - DuoNebs Q4h PRN - Albuterol rescue inhaler  Malignancy of lung LUL Patient has known LUL neoplasm and is undergoing sterotactic body radiation therapy per Radiation Oncology. - CT chest w/ contrast ordered by radiation oncology, now cancelled in setting of AKI  Elevated transaminases, alkaline phosphatase Given normal Tbili, these elevations may be evidence of end organ dysfunction in setting of sepsis. - IVF and sepsis treatment as above - Daily CMP, continue to monitor  CHF LVEF 55-60% in October 2023, improved from 40-45% in July 2023. - Caution with fluid resuscitation, limit IVF to 10 hours - Hold daily torsemide in setting of AKI  HLD - Rosuvastatin held in setting of elevated transaminases  T2DM Last A1c 7.0 in November 2023. - f/u A1c - Metformin held inpatient - sSSI, CBGs Q4h  Anxiety Depression - Duloxetine held in setting of acute metabolic encephalopathy thought to be related to polypharmacy  BPD1 - Ropinirole, olanzapine held in setting of acute metabolic encephalopathy thought to be related to polypharmacy  GERD - Pantoprazole 40 mg PO restarted  Tobacco use disorder Per chart review, patient is actively smoking.   Will counsel tobacco cessation when patient more alert.  Obesity BMI 30.04 on admission  DVT prophylaxis: [place] heparin Code Status: FULL Family Communication: Husband was present in ED, later left prior to hospitalist visit Disposition Plan: Home once treated and clinically stable Consults called: None Admission status: Stepdown ICU  Severity of Illness: The appropriate patient status for this patient is INPATIENT. Inpatient status is judged to be reasonable and necessary in order to provide the required intensity of service to ensure the patient's safety. The patient's presenting symptoms, physical exam findings, and initial radiographic and laboratory data in the context of their chronic comorbidities is felt to place them at high risk for further clinical deterioration. Furthermore, it is not anticipated that the patient will be medically stable for discharge from the hospital within 2 midnights of admission.   * I certify that at the point of admission it is my clinical judgment that the patient will require inpatient hospital care spanning beyond 2 midnights from the point of admission due to high intensity of service, high risk for further deterioration and high frequency of surveillance required.*  Cyd Hostler, MS4 [facility]  If 7PM-7AM, please contact night-coverage [email or web address]  09/23/2022, 4:31 PM

## 2022-09-23 NOTE — ED Triage Notes (Signed)
Pt was placed on dilauted 2mg  x2 days ago. Pts husband states she has been more lethargic / having slurred speech since then.

## 2022-09-23 NOTE — ED Notes (Addendum)
Patient is hard to stay awake during triage   Pt wears 4L O2 at basline

## 2022-09-23 NOTE — Progress Notes (Signed)
   09/23/22 2231  Vitals  Pulse Rate (!) (S)  155 (patient SVT, 12 leads EKG done, MD made aware,)   Patient HR was sustaining in 120s-130s during the admission to the ICU, her HR jumped upto 150s , BP 120/51,satting 99% in 3 litres [place], RR 22, patient is somnolent and difficult to arouse but is responsive to sternal rub,  asymptomatic, did the 12 leads EKG and placed in the physical chart, Showed SVT and QT prolongation, MD made aware, HR came back to 140s, MD came to see the patient in the unit, placed an order for Magnesium labs, results awaiting, With regards to cocaine on her UDS, MD doesn't want to give her any meds to control her HR, consider Tachycardia to be the result of sepsis and cocaine in her system, got an order to monitor her, HR is irregular and is SVT, will continue to monitor and will endorse.

## 2022-09-23 NOTE — ED Notes (Addendum)
Emptied pts urine canister; 850 mL noted prior to emptying; hooked canister back up to suction and purewick

## 2022-09-23 NOTE — ED Triage Notes (Signed)
Patient arrived by [facility]. Pt fell In her bathroom floor at 0400 today. Denies hitting her head. No LOC.  Pt has hx lung ca. Recently started on PO dilauted. Pt CBG w/ EMS read high. Pt takes metformin, no insulin.

## 2022-09-23 NOTE — ED Provider Notes (Signed)
[facility] Provider Note   CSN: [id number] Arrival date & time: 09/23/22  [id number]     History  Chief Complaint  Patient presents with   [name]    SHALESE GEDNEY is a 67 y.o. female.  Patient has a history of lung cancer.  She presents with weakness and a fall.  Patient started Dilaudid for pain recently  The history is provided by the patient and medical records. No language interpreter was used.  Fall This is a new problem. The problem occurs rarely. The problem has been resolved. Pertinent negatives include no chest pain and no abdominal pain. Nothing aggravates the symptoms. Nothing relieves the symptoms. She has tried nothing for the symptoms. The treatment provided no relief.       Home Medications Prior to Admission medications   Medication Sig Start Date End Date Taking? Authorizing Provider  acetaminophen (TYLENOL) 500 MG tablet Take 1 tablet (500 mg total) by mouth every 6 (six) hours as needed. Patient taking differently: Take 1,000 mg by mouth every 6 (six) hours as needed. 02/26/21  Yes Stover, Titorya, DPM  albuterol (PROVENTIL) (2.5 MG/3ML) 0.083% nebulizer solution INHALE 1 VIAL VIA NEBULIZER EVERY 4 HOURS 07/14/21  Yes Tanda Rockers, MD  albuterol (VENTOLIN HFA) 108 (90 Base) MCG/ACT inhaler INHALE 1 OR 2 PUFFS BY MOUTH EVERY 6 HOURS AS NEEDED FOR WHEEZING OR SHORTNESS OF BREATH 05/24/22  Yes Parrett, Tammy S, NP  Budeson-Glycopyrrol-Formoterol (BREZTRI AEROSPHERE) 160-9-4.8 MCG/ACT AERO Inhale 2 puffs into the lungs 2 (two) times daily. 01/06/22  Yes Shelly Coss, MD  cetirizine (ZYRTEC) 10 MG tablet Take 10 mg by mouth daily as needed for allergies.  11/02/19  Yes [provider]  clopidogrel (PLAVIX) 75 MG tablet Take 1 tablet (75 mg total) by mouth daily. 01/07/22  Yes Shelly Coss, MD  DULoxetine (CYMBALTA) 20 MG capsule Take 20 mg by mouth daily.   Yes [provider]  gabapentin (NEURONTIN)  800 MG tablet Take 1 tablet (800 mg total) by mouth 3 (three) times daily as needed. 01/06/22  Yes Shelly Coss, MD  HYDROmorphone (DILAUDID) 4 MG tablet Take 1 tablet (4 mg total) by mouth every 6 (six) hours as needed for severe pain. 09/21/22  Yes Kyung Rudd, MD  hydrOXYzine (VISTARIL) 25 MG capsule Take 25 mg by mouth 3 (three) times daily as needed for anxiety. 09/01/22  Yes [provider]  ibuprofen (ADVIL) 800 MG tablet Take 800 mg by mouth 3 (three) times daily.   Yes [provider]  Ipratropium-Albuterol (COMBIVENT RESPIMAT) 20-100 MCG/ACT AERS respimat INHALE 1 PUFF INTO THE LUNGS EVERY (4) HOURS. 05/19/22  Yes Parrett, Tammy S, NP  meloxicam (MOBIC) 15 MG tablet Take 15 mg by mouth daily.   Yes [provider]  metFORMIN (GLUCOPHAGE) 1000 MG tablet Take 1 tablet (1,000 mg total) by mouth 2 (two) times daily. 05/25/22  Yes Reardon, Juanetta Beets, NP  nitroGLYCERIN (NITROSTAT) 0.4 MG SL tablet Place under the tongue. 09/08/21  Yes [provider]  nystatin (MYCOSTATIN) 100000 UNIT/ML suspension TAKE 5MLS BY MOUTH TWICE DAILY FOR 7 DAYS. 09/17/22  Yes Rigoberto Noel, MD  OLANZapine (ZYPREXA) 5 MG tablet Take 5 mg by mouth at bedtime. 09/20/22  Yes [provider]  omeprazole (PRILOSEC) 20 MG capsule Take 20 mg by mouth daily.   Yes [provider]  ondansetron (ZOFRAN-ODT) 4 MG disintegrating tablet Take 4 mg by mouth 2 (two) times daily as needed  for nausea/vomiting. 11/19/21  Yes [provider]  pantoprazole (PROTONIX) 40 MG tablet Take 40 mg by mouth daily. 02/24/22  Yes [provider]  rOPINIRole (REQUIP) 1 MG tablet Take 2 mg by mouth 3 (three) times daily. 09/12/15  Yes [provider]  rosuvastatin (CRESTOR) 10 MG tablet Take 1 tablet (10 mg total) by mouth daily. 01/25/22  Yes Isaiah Serge, NP  SUMAtriptan (IMITREX) 25 MG tablet Take 25 mg by mouth every 2 (two) hours as needed for migraine. 12/23/20  Yes  [provider]  torsemide (DEMADEX) 20 MG tablet Take 1 tablet (20 mg total) by mouth daily as needed. May take an additional 20 mg daily as needed for extreme swelling. 01/06/22  Yes Shelly Coss, MD  Accu-Chek Softclix Lancets lancets Use as instructed to monitor glucose twice daily 05/25/22   Brita Romp, NP  aspirin EC 81 MG tablet Take 1 tablet (81 mg total) by mouth daily. Swallow whole. Patient not taking: Reported on 09/23/2022 01/07/22   Shelly Coss, MD  clotrimazole (MYCELEX) 10 MG troche Take 1 tablet (10 mg total) by mouth 5 (five) times daily. Patient not taking: Reported on 09/23/2022 02/19/22   Parrett, Fonnie Mu, NP  doxycycline (VIBRA-TABS) 100 MG tablet Take 1 tablet (100 mg total) by mouth 2 (two) times daily. Patient not taking: Reported on 09/23/2022 05/31/22   Parrett, Fonnie Mu, NP  glucose blood (ACCU-CHEK GUIDE) test strip Use as instructed to monitor glucose twice daily 05/25/22   Brita Romp, NP  ipratropium (ATROVENT) 0.02 % nebulizer solution Take 2.5 mLs by nebulization every 4 (four) hours as needed for wheezing or shortness of breath. Patient not taking: Reported on 09/23/2022 12/28/21   [provider]  olmesartan-hydrochlorothiazide (BENICAR HCT) 20-12.5 MG tablet TAKE (1) TABLET BY MOUTH DAILY Patient not taking: Reported on 09/23/2022 05/28/22   Parrett, Fonnie Mu, NP  thiamine 100 MG tablet Take 1 tablet (100 mg total) by mouth daily. Patient not taking: Reported on 09/23/2022 01/07/22   Shelly Coss, MD      Allergies    Penicillins and Septra [sulfamethoxazole-trimethoprim]    Review of Systems   Review of Systems  Unable to perform ROS: Mental status change  Cardiovascular:  Negative for chest pain.  Gastrointestinal:  Negative for abdominal pain.    Physical Exam Updated Vital Signs BP 111/66   Pulse (!) 127   Temp 98.6 F (37 C) (Oral)   Resp (!) 22   Ht 5\' 4"  (1.626 m)   Wt 79.4 kg   SpO2 97%   BMI 30.04 kg/m   Physical Exam Vitals and nursing note reviewed.  Constitutional:      Appearance: She is well-developed.     Comments: Lethargic  HENT:     Head: Normocephalic.     Nose: Nose normal.  Eyes:     General: No scleral icterus.    Conjunctiva/sclera: Conjunctivae normal.  Neck:     Thyroid: No thyromegaly.  Cardiovascular:     Rate and Rhythm: Normal rate and regular rhythm.     Heart sounds: No murmur heard.    No friction rub. No gallop.  Pulmonary:     Breath sounds: No stridor. No wheezing or rales.  Chest:     Chest wall: No tenderness.  Abdominal:     General: There is no distension.     Tenderness: There is no abdominal tenderness. There is no rebound.  Musculoskeletal:  General: Normal range of motion.     Cervical back: Neck supple.  Lymphadenopathy:     Cervical: No cervical adenopathy.  Skin:    Findings: No erythema or rash.  Neurological:     Motor: No abnormal muscle tone.     Coordination: Coordination normal.     Comments: Oriented to person     ED Results / Procedures / Treatments   Labs (all labs ordered are listed, but only abnormal results are displayed) Labs Reviewed  CBC WITH DIFFERENTIAL/PLATELET - Abnormal; Notable for the following components:      Result Value   WBC 14.5 (*)    RBC 3.75 (*)    Hemoglobin 11.7 (*)    HCT 35.0 (*)    Neutro Abs 10.9 (*)    Monocytes Absolute 1.1 (*)    Eosinophils Absolute 0.9 (*)    Abs Immature Granulocytes 0.09 (*)    All other components within normal limits  COMPREHENSIVE METABOLIC PANEL - Abnormal; Notable for the following components:   Sodium 133 (*)    Glucose, Bld 100 (*)    Creatinine, Ser 1.46 (*)    Calcium 10.4 (*)    AST 134 (*)    ALT 59 (*)    Alkaline Phosphatase 222 (*)    GFR, Estimated 39 (*)    All other components within normal limits  URINALYSIS, ROUTINE W REFLEX MICROSCOPIC - Abnormal; Notable for the following components:   APPearance HAZY (*)    Hgb urine dipstick  MODERATE (*)    Nitrite POSITIVE (*)    Leukocytes,Ua LARGE (*)    Bacteria, UA MANY (*)    All other components within normal limits  RAPID URINE DRUG SCREEN, HOSP PERFORMED - Abnormal; Notable for the following components:   Opiates POSITIVE (*)    Cocaine POSITIVE (*)    All other components within normal limits  CBG MONITORING, ED - Abnormal; Notable for the following components:   Glucose-Capillary 144 (*)    All other components within normal limits  URINE CULTURE  ETHANOL  AMMONIA    EKG None  Radiology DG Pelvis Portable  Result Date: 09/23/2022 CLINICAL DATA:  [place] in bathroom at 0400 hours today, history lung cancer EXAM: PORTABLE PELVIS 1-2 VIEWS COMPARISON:  Portable exam 1137 hours without priors for comparison FINDINGS: Osseous demineralization. SI joints preserved. Asymmetric narrowing of LEFT hip joint consistent with degenerative changes. RIGHT hip joint space preserved. No acute fracture, dislocation, or bone destruction. Scattered vascular calcifications. IMPRESSION: Degenerative changes LEFT hip joint with osseous demineralization. No acute abnormalities. Electronically Signed   By: Lavonia Dana M.D.   On: 09/23/2022 12:02   DG Chest Port 1 View  Result Date: 09/23/2022 CLINICAL DATA:  [place] in bathroom at 0400 hours today, no loss of consciousness, history lung cancer EXAM: PORTABLE CHEST 1 VIEW COMPARISON:  Portable exam 1140 hours compared to 01/12/2022 FINDINGS: Normal heart size, mediastinal contours, and pulmonary vascularity. Nodular focus lateral LEFT upper lobe again identified, 15 mm diameter, corresponding to known cavitary lesion on prior CT. Minimal biapical scarring. Lungs otherwise clear. No acute infiltrate, pleural effusion, or pneumothorax. Bones demineralized. IMPRESSION: LEFT upper lobe nodule consistent with known cavitary neoplasm. No acute abnormalities. Electronically Signed   By: Lavonia Dana M.D.   On: 09/23/2022 12:01   CT Head Wo  Contrast  Result Date: 09/23/2022 CLINICAL DATA:  Fall EXAM: CT HEAD WITHOUT CONTRAST CT CERVICAL SPINE WITHOUT CONTRAST TECHNIQUE: Multidetector CT imaging of the head and  cervical spine was performed following the standard protocol without intravenous contrast. Multiplanar CT image reconstructions of the cervical spine were also generated. RADIATION DOSE REDUCTION: This exam was performed according to the departmental dose-optimization program which includes automated exposure control, adjustment of the mA and/or kV according to patient size and/or use of iterative reconstruction technique. COMPARISON:  CT facial bones 12/12/2016, cervical spine radiographs 10/05/2016 FINDINGS: CT HEAD FINDINGS Brain: There is no acute intracranial hemorrhage, extra-axial fluid collection, or acute infarct. Parenchymal volume is normal. The ventricles are normal in size. Gray-white differentiation is preserved. The pituitary and suprasellar region are normal. There is no mass lesion. There is no mass effect or midline shift. Vascular: There is calcification of the bilateral carotid siphons and vertebral arteries. Skull: Normal. Negative for fracture or focal lesion. Sinuses/Orbits: The paranasal sinuses are clear. Bilateral lens implants are in place. The globes and orbits are otherwise unremarkable. Other: None. CT CERVICAL SPINE FINDINGS Alignment: There is a reversal of the normal cervical curvature. There is no jumped or perched facet or other evidence of traumatic malalignment. Skull base and vertebrae: Skull base alignment is maintained. Vertebral body heights are preserved. There is no evidence of acute fracture. There is no suspicious osseous lesion. Soft tissues and spinal canal: No prevertebral fluid or swelling. No visible canal hematoma. Disc levels: There is multilevel disc space narrowing and degenerative endplate change, most advanced at C5-C6 and C6-C7. There is multilevel facet arthropathy, most advanced at  C2-C3 and C3-C4. There is ossification of the posterior longitudinal ligament at C5-C6 resulting in at least mild-to-moderate spinal canal stenosis. Upper chest: The previously seen solid lesion in the left apex on the CT from 07/13/2022 now demonstrates internal cavitation and is similar in appearance to the more remote prior CT from 03/22/2022. Other: None. IMPRESSION: 1. No acute intracranial pathology. 2. No acute fracture or traumatic malalignment of the cervical spine. 3. The previously seen solid lesion in the left apex now demonstrates internal cavitation, similar in appearance to the more remote CT chest from 03/22/2022. Malignancy remains a possibility. Electronically Signed   By: Valetta Mole M.D.   On: 09/23/2022 11:52   CT Cervical Spine Wo Contrast  Result Date: 09/23/2022 CLINICAL DATA:  Fall EXAM: CT HEAD WITHOUT CONTRAST CT CERVICAL SPINE WITHOUT CONTRAST TECHNIQUE: Multidetector CT imaging of the head and cervical spine was performed following the standard protocol without intravenous contrast. Multiplanar CT image reconstructions of the cervical spine were also generated. RADIATION DOSE REDUCTION: This exam was performed according to the departmental dose-optimization program which includes automated exposure control, adjustment of the mA and/or kV according to patient size and/or use of iterative reconstruction technique. COMPARISON:  CT facial bones 12/12/2016, cervical spine radiographs 10/05/2016 FINDINGS: CT HEAD FINDINGS Brain: There is no acute intracranial hemorrhage, extra-axial fluid collection, or acute infarct. Parenchymal volume is normal. The ventricles are normal in size. Gray-white differentiation is preserved. The pituitary and suprasellar region are normal. There is no mass lesion. There is no mass effect or midline shift. Vascular: There is calcification of the bilateral carotid siphons and vertebral arteries. Skull: Normal. Negative for fracture or focal lesion.  Sinuses/Orbits: The paranasal sinuses are clear. Bilateral lens implants are in place. The globes and orbits are otherwise unremarkable. Other: None. CT CERVICAL SPINE FINDINGS Alignment: There is a reversal of the normal cervical curvature. There is no jumped or perched facet or other evidence of traumatic malalignment. Skull base and vertebrae: Skull base alignment is maintained. Vertebral body heights  are preserved. There is no evidence of acute fracture. There is no suspicious osseous lesion. Soft tissues and spinal canal: No prevertebral fluid or swelling. No visible canal hematoma. Disc levels: There is multilevel disc space narrowing and degenerative endplate change, most advanced at C5-C6 and C6-C7. There is multilevel facet arthropathy, most advanced at C2-C3 and C3-C4. There is ossification of the posterior longitudinal ligament at C5-C6 resulting in at least mild-to-moderate spinal canal stenosis. Upper chest: The previously seen solid lesion in the left apex on the CT from 07/13/2022 now demonstrates internal cavitation and is similar in appearance to the more remote prior CT from 03/22/2022. Other: None. IMPRESSION: 1. No acute intracranial pathology. 2. No acute fracture or traumatic malalignment of the cervical spine. 3. The previously seen solid lesion in the left apex now demonstrates internal cavitation, similar in appearance to the more remote CT chest from 03/22/2022. Malignancy remains a possibility. Electronically Signed   By: Valetta Mole M.D.   On: 09/23/2022 11:52    Procedures Procedures    Medications Ordered in ED Medications  cefTRIAXone (ROCEPHIN) 2 g in sodium chloride 0.9 % 100 mL IVPB (2 g Intravenous New Bag/Given 09/23/22 1439)  naloxone (NARCAN) injection 0.4 mg (0.4 mg Intravenous Given 09/23/22 1224)    ED Course/ Medical Decision Making/ A&P                             Medical Decision Making Amount and/or Complexity of Data Reviewed Labs: ordered. Radiology:  ordered. ECG/medicine tests: ordered.  Risk Prescription drug management. Decision regarding hospitalization.  This patient presents to the ED for concern of fall, this involves an extensive number of treatment options, and is a complaint that carries with it a high risk of complications and morbidity.  The differential diagnosis includes head injury, hip fracture   Co morbidities that complicate the patient evaluation  Lung cancer   Additional history obtained:  Additional history obtained from family External records from outside source obtained and reviewed including hospital record   Lab Tests:  I Ordered, and personally interpreted labs.  The pertinent results include: Drug screen positive for cocaine and opiates   Imaging Studies ordered:  I ordered imaging studies including chest x-ray shows the lung cancer I independently visualized and interpreted imaging which showed lung cancer I agree with the radiologist interpretation   Cardiac Monitoring: / EKG:  The patient was maintained on a cardiac monitor.  I personally viewed and interpreted the cardiac monitored which showed an underlying rhythm of: Normal sinus rhythm   Consultations Obtained:  I requested consultation with the hospitalist,  and discussed lab and imaging findings as well as pertinent plan - they recommend: Admit   Problem List / ED Course / Critical interventions / Medication management  Fall and lethargy from Dilaudid with UTI I ordered medication including for UTI Reevaluation of the patient after these medicines showed that the patient stayed the same I have reviewed the patients home medicines and have made adjustments as needed   Social Determinants of Health:  None   Test / Admission - Considered:  None  Patient with opiate overdose and UTI and fall.  She will be admitted to medicine        Final Clinical Impression(s) / ED Diagnoses Final diagnoses:  Acute cystitis  with hematuria    Rx / DC Orders ED Discharge Orders     None  Milton Ferguson, MD 09/24/22 1850

## 2022-09-23 NOTE — ED Notes (Signed)
Pt has been changed and placed a clean brief and purewick. Pt felt hot to the touch and checked temp. 100.5 rectally.

## 2022-09-23 NOTE — Progress Notes (Signed)
  Radiation Oncology         [phone] ________________________________  Name: [name] MRN: [id number]  Date: 09/21/2022  DOB: August 28, 1955  End of Treatment Note  Diagnosis:  Putative Stage IA3, cT1cN0M0, NSCLC of the LUL   Indication for treatment:  Curative       Radiation treatment dates:     09/07/2022 through 09/21/2022 Site Technique Total Dose (Gy) Dose per Fx (Gy) Completed Fx Beam Energies  Lung, Left: Lung_L_LUL IMRT 60/60 12 5/5 6XFFF    Narrative: The patient tolerated radiation treatment relatively well.   The patient did not have any signs of acute toxicity during treatment.  Plan: The patient will receive a call in about one month from the radiation oncology department. She will continue follow up with Dr. Elsworth Soho in pulmonary medicine, and he has a CT scan coordinated for next month. We have ordered repeat imaging 6 months following this as well and will plan to follow her per NCCN guidelines with scans every 6 months for 5 years.      Carola Rhine, PAC

## 2022-09-23 NOTE — Telephone Encounter (Signed)
Please advise on refill request

## 2022-09-24 ENCOUNTER — Telehealth: Payer: Self-pay | Admitting: Cardiology

## 2022-09-24 DIAGNOSIS — R9431 Abnormal electrocardiogram [ECG] [EKG]: Secondary | ICD-10-CM | POA: Diagnosis not present

## 2022-09-24 DIAGNOSIS — I471 Supraventricular tachycardia, unspecified: Secondary | ICD-10-CM | POA: Diagnosis not present

## 2022-09-24 LAB — CBC
HCT: 33 % — ABNORMAL LOW (ref 36.0–46.0)
Hemoglobin: 10.9 g/dL — ABNORMAL LOW (ref 12.0–15.0)
MCH: 30.8 pg (ref 26.0–34.0)
MCHC: 33 g/dL (ref 30.0–36.0)
MCV: 93.2 fL (ref 80.0–100.0)
Platelets: 332 10*3/uL (ref 150–400)
RBC: 3.54 MIL/uL — ABNORMAL LOW (ref 3.87–5.11)
RDW: 15.3 % (ref 11.5–15.5)
WBC: 10.6 10*3/uL — ABNORMAL HIGH (ref 4.0–10.5)
nRBC: 0 % (ref 0.0–0.2)

## 2022-09-24 LAB — GLUCOSE, CAPILLARY
Glucose-Capillary: 92 mg/dL (ref 70–99)
Glucose-Capillary: 111 mg/dL — ABNORMAL HIGH (ref 70–99)
Glucose-Capillary: 164 mg/dL — ABNORMAL HIGH (ref 70–99)
Glucose-Capillary: 189 mg/dL — ABNORMAL HIGH (ref 70–99)
Glucose-Capillary: 213 mg/dL — ABNORMAL HIGH (ref 70–99)

## 2022-09-24 LAB — POTASSIUM: Potassium: 3.4 mmol/L — ABNORMAL LOW (ref 3.5–5.1)

## 2022-09-24 LAB — COMPREHENSIVE METABOLIC PANEL
ALT: 49 U/L — ABNORMAL HIGH (ref 0–44)
AST: 97 U/L — ABNORMAL HIGH (ref 15–41)
Albumin: 3.1 g/dL — ABNORMAL LOW (ref 3.5–5.0)
Alkaline Phosphatase: 182 U/L — ABNORMAL HIGH (ref 38–126)
Anion gap: 12 (ref 5–15)
BUN: 11 mg/dL (ref 8–23)
CO2: 22 mmol/L (ref 22–32)
Calcium: 10.1 mg/dL (ref 8.9–10.3)
Chloride: 104 mmol/L (ref 98–111)
Creatinine, Ser: 0.64 mg/dL (ref 0.44–1.00)
GFR, Estimated: >60 mL/min (ref >60)
Glucose, Bld: 85 mg/dL (ref 70–99)
Potassium: 2.9 mmol/L — ABNORMAL LOW (ref 3.5–5.1)
Sodium: 138 mmol/L (ref 135–145)
Total Bilirubin: 1 mg/dL (ref 0.3–1.2)
Total Protein: 5.9 g/dL — ABNORMAL LOW (ref 6.5–8.1)

## 2022-09-24 LAB — MAGNESIUM
Magnesium: 0.8 mg/dL — CL (ref 1.7–2.4)
Magnesium: 1.4 mg/dL — ABNORMAL LOW (ref 1.7–2.4)
Magnesium: 1.3 mg/dL — ABNORMAL LOW (ref 1.7–2.4)

## 2022-09-24 LAB — MRSA NEXT GEN BY PCR, NASAL: MRSA by PCR Next Gen: NOT DETECTED

## 2022-09-24 MED ORDER — BIOTENE DRY MOUTH MT LIQD: 15.0000 mL | OROMUCOSAL | Status: DC | PRN

## 2022-09-24 MED ORDER — POTASSIUM CHLORIDE CRYS ER 20 MEQ PO TBCR
40.0000 meq | EXTENDED_RELEASE_TABLET | Freq: Once | ORAL | Status: AC
  Administered 2022-09-24: 40 meq via ORAL
  Filled 2022-09-24: qty 2

## 2022-09-24 MED ORDER — GUAIFENESIN-DM 100-10 MG/5ML PO SYRP
5.0000 mL | ORAL_SOLUTION | ORAL | Status: DC | PRN
  Administered 2022-09-24 – 2022-09-26 (×4): 5 mL via ORAL
  Filled 2022-09-24 (×4): qty 5

## 2022-09-24 MED ORDER — MAGNESIUM SULFATE 2 GM/50ML IV SOLN
2.0000 g | Freq: Once | INTRAVENOUS | Status: AC
  Administered 2022-09-24: 2 g via INTRAVENOUS
  Filled 2022-09-24: qty 50

## 2022-09-24 MED ORDER — ORAL CARE MOUTH RINSE: 15.0000 mL | OROMUCOSAL | Status: DC | PRN

## 2022-09-24 MED ORDER — GABAPENTIN 400 MG PO CAPS
400.0000 mg | ORAL_CAPSULE | Freq: Three times a day (TID) | ORAL | Status: DC
  Administered 2022-09-24 – 2022-09-26 (×6): 400 mg via ORAL
  Filled 2022-09-24 (×6): qty 1

## 2022-09-24 MED ORDER — POTASSIUM CHLORIDE 10 MEQ/100ML IV SOLN
10.0000 meq | INTRAVENOUS | Status: AC
  Administered 2022-09-24 (×3): 10 meq via INTRAVENOUS
  Filled 2022-09-24 (×3): qty 100

## 2022-09-24 MED ORDER — ROPINIROLE HCL 1 MG PO TABS
1.0000 mg | ORAL_TABLET | Freq: Three times a day (TID) | ORAL | Status: DC
  Administered 2022-09-24 – 2022-09-26 (×6): 1 mg via ORAL
  Filled 2022-09-24 (×6): qty 1

## 2022-09-24 NOTE — Progress Notes (Signed)
CRITICAL VALUE STICKER  CRITICAL VALUE: Magnesium 0.8  RECEIVER (on-site recipient of call): Kathleen Lime, RN  DATE & TIME NOTIFIED: [place] (representative from lab):  MD NOTIFIED: Dr. Josephine Cables  TIME OF NOTIFICATION: 0015  RESPONSE:  awaiting

## 2022-09-24 NOTE — Telephone Encounter (Signed)
Patient is currently admitted at the ICU at [facility] and was wondering her her cardiologist could come by to check on her. Requesting call back.

## 2022-09-24 NOTE — Plan of Care (Signed)
  Problem: Acute Rehab PT Goals(only PT should resolve) Goal: Pt Will Go Supine/Side To Sit Outcome: Progressing Flowsheets (Taken 09/24/2022 1351) Pt will go Supine/Side to Sit:  with modified independence  Independently Goal: Patient Will Transfer Sit To/From Stand Outcome: Progressing Flowsheets (Taken 09/24/2022 1351) Patient will transfer sit to/from stand: with modified independence Goal: Pt Will Transfer Bed To Chair/Chair To Bed Outcome: Progressing Flowsheets (Taken 09/24/2022 1351) Pt will Transfer Bed to Chair/Chair to Bed: with modified independence Goal: Pt Will Ambulate Outcome: Progressing Flowsheets (Taken 09/24/2022 1351) Pt will Ambulate:  100 feet  with modified independence  with supervision  with rolling walker  with least restrictive assistive device   1:51 PM, 09/24/22 Lonell Grandchild, MPT Physical Therapist with [facility] [phone] office [phone] mobile phone

## 2022-09-24 NOTE — Progress Notes (Signed)
  Transition of Care (TOC) Screening Note   Patient Details  Name: [name] Date of Birth: 02-Oct-1955   Transition of Care [facility]) CM/SW Contact:    Ihor Gully, LCSW Phone Number: 09/24/2022, 12:21 PM    Transition of Care Department [facility]) has reviewed patient and no TOC needs have been identified at this time. We will continue to monitor patient advancement through interdisciplinary progression rounds. If new patient transition needs arise, please place a TOC consult.

## 2022-09-24 NOTE — Evaluation (Signed)
Physical Therapy Evaluation Patient Details Name: [name] MRN: [id number] DOB: 1956-03-09 Today's Date: 09/24/2022  History of Present Illness  [name] is a 67 y.o. female with medical history significant for severe COPD on 4 L O2, emphysema, LUL lung malignancy undergoing radiation therapy, tobacco use disorder, BPD1, CHF, HTN, and more brought to ED by EMS acutely altered and following fall at home this morning.  Unable to obtain history from patient due to somnolence and slurred speech, with husband no longer at bedside.  History primarily obtained from chart review and ED notes.  Patient struggles to stay awake during the exam and interview and slurs her speech heavily.  Reportedly was started on 2 mg Dilaudid 2 days ago and has been more lethargic with slurred speech since.  Denies hitting head or LOC during fall.  UDS is positive for cocaine, but patient denies using cocaine when asked.  Does follow with radiation oncology for treatment of lung neoplasm, received treatment with SBRT on 3/19.   Clinical Impression  Patient functioning near baseline for functional mobility and gait other than requiring use of RW for longer distances due to fatigue.  Patient ambulated while on 3 LPM with SpO2 remaining above 95% - nurse notified.  Patient tolerated sitting up in chair after therapy.  Patient will benefit from continued skilled physical therapy in hospital and recommended venue below to increase strength, balance, endurance for safe ADLs and gait.          Recommendations for follow up therapy are one component of a multi-disciplinary discharge planning process, led by the attending physician.  Recommendations may be updated based on patient status, additional functional criteria and insurance authorization.  Follow Up Recommendations Home health PT      Assistance Recommended at Discharge Set up Supervision/Assistance  Patient can return home with the following  A  little help with walking and/or transfers;A little help with bathing/dressing/bathroom;Help with stairs or ramp for entrance;Assistance with cooking/housework    Equipment Recommendations None recommended by PT  Recommendations for Other Services       Functional Status Assessment Patient has had a recent decline in their functional status and demonstrates the ability to make significant improvements in function in a reasonable and predictable amount of time.     Precautions / Restrictions Precautions Precautions: Fall Restrictions Weight Bearing Restrictions: No      Mobility  Bed Mobility Overal bed mobility: Needs Assistance Bed Mobility: Supine to Sit     Supine to sit: Supervision     General bed mobility comments: increased time, labored movement    Transfers Overall transfer level: Needs assistance Equipment used: Rolling walker (2 wheels), None, 1 person hand held assist Transfers: Sit to/from Stand, Bed to chair/wheelchair/BSC Sit to Stand: Supervision   Step pivot transfers: Supervision       General transfer comment: good return for transferring to chair and commode without use of an AD    Ambulation/Gait Ambulation/Gait assistance: Supervision, Min guard Gait Distance (Feet): 65 Feet Assistive device: Rolling walker (2 wheels) Gait Pattern/deviations: Decreased step length - right, Decreased step length - left, Decreased stride length Gait velocity: decreased     General Gait Details: good return for taking steps in room without AD, but requires [place] of RW for longer distances for safety with good return ambulating in hallway without loss of balance  Stairs            Wheelchair Mobility    Modified Rankin (Stroke Patients  Only)       Balance Overall balance assessment: Needs assistance Sitting-balance support: Feet supported, No upper extremity supported Sitting balance-Leahy Scale: Good Sitting balance - Comments: seated at EOB    Standing balance support: During functional activity, No upper extremity supported Standing balance-Leahy Scale: Fair Standing balance comment: fair/good using RW                             Pertinent Vitals/Pain Pain Assessment Pain Assessment: No/denies pain    Home Living Family/patient expects to be discharged to:: Private residence Living Arrangements: Spouse/significant other Available Help at Discharge: Family;Available 24 hours/day Type of Home: Mobile home Home Access: Stairs to enter Entrance Stairs-Rails: Right;Left;Can reach both Entrance Stairs-Number of Steps: 2   Home Layout: One level Home Equipment: Advice worker (2 wheels) Additional Comments: wears 4L O2 baseline    Prior Function Prior Level of Function : Independent/Modified Independent             Mobility Comments: Typically indep without DME, intermittent use of RW if needed. patient nor husband drive, have family or transportation assist ADLs Comments: Assisted with showers     Hand Dominance        Extremity/Trunk Assessment   Upper Extremity Assessment Upper Extremity Assessment: Overall WFL for tasks assessed    Lower Extremity Assessment Lower Extremity Assessment: Generalized weakness    Cervical / Trunk Assessment Cervical / Trunk Assessment: Normal  Communication   Communication: No difficulties  Cognition Arousal/Alertness: Awake/alert Behavior During Therapy: WFL for tasks assessed/performed Overall Cognitive Status: Within Functional Limits for tasks assessed                                          General Comments      Exercises     Assessment/Plan    PT Assessment Patient needs continued PT services  PT Problem List Decreased strength;Decreased activity tolerance;Decreased balance;Decreased mobility       PT Treatment Interventions DME instruction;Gait training;Stair training;Functional mobility training;Therapeutic  activities;Therapeutic exercise;Balance training;Patient/family education    PT Goals (Current goals can be found in the Care Plan section)  Acute Rehab PT Goals Patient Stated Goal: return home with family to assist PT Goal Formulation: With patient Time For Goal Achievement: 10/01/22 Potential to Achieve Goals: Good    Frequency Min 3X/week     Co-evaluation               AM-PAC PT "6 Clicks" Mobility  Outcome Measure Help needed turning from your back to your side while in a flat bed without using bedrails?: None Help needed moving from lying on your back to sitting on the side of a flat bed without using bedrails?: None Help needed moving to and from a bed to a chair (including a wheelchair)?: None Help needed standing up from a chair using your arms (e.g., wheelchair or bedside chair)?: None Help needed to walk in hospital room?: A Little Help needed climbing 3-5 steps with a railing? : A Little 6 Click Score: 22    End of Session Equipment Utilized During Treatment: Oxygen Activity Tolerance: Patient tolerated treatment well;Patient limited by fatigue Patient left: in chair;with call bell/phone within reach Nurse Communication: Mobility status PT Visit Diagnosis: Unsteadiness on feet (R26.81);Other abnormalities of gait and mobility (R26.89);Muscle weakness (generalized) (M62.81)    Time: [id number]  PT Time Calculation (min) (ACUTE ONLY): 33 min   Charges:   PT Evaluation $PT Eval Moderate Complexity: 1 Mod PT Treatments $Therapeutic Activity: 23-37 mins        1:50 PM, 09/24/22 Lonell Grandchild, MPT Physical Therapist with [facility] [phone] office [phone] mobile phone

## 2022-09-24 NOTE — Progress Notes (Signed)
PROGRESS NOTE    [name]  [id number] DOB: March 13, 1956 DOA: 09/23/2022 PCP: Yves Dill, NP  Brief Narrative:  [name] is a 67 y.o. female with medical history significant for severe COPD on 4 L O2, emphysema, LUL lung malignancy undergoing SBRT (last 3/19), tobacco use disorder, BPD1, CHF, HTN, and more brought to ED by EMS acutely altered and following fall at home 3/21 without LOC or head impact.  Patient presented to the ED somnolent and with slurred speech.  Accompanied by husband who reported that she was started on 2 mg Dilaudid 2 days ago and has been more lethargic with slurred speech since.  CBG, ethanol, and ammonia WNL.  Transaminases and alk phosphatase elevated.  Cr at 1.46 (~0.8 baseline).  CT head and neck without acute intracranial pathology, showing known solid lung lesion.  Patient became more alert after 1 dose naloxone, later became more somnolent again.  UDS positive for cocaine and opiates.  UA remarkable for UTI.  Hospitalist team consulted and patient admitted for suspected sepsis secondary to UTI and AKI.  Started on 2 g CTX Q24h.  Mental status greatly improved 3/22, continuing IV antibiotics and repleting electrolytes.  Problem List:   Principal Problem:   Sepsis ([place]) Active Problems:   COPD GOLD 2/ still smoker    Tobacco use disorder   Hyperlipidemia   Bipolar 1 disorder (HCC)   Anxiety   Polysubstance abuse ([place])  Assessment and Plan:  Sepsis, improved Urinary tract infection Patient meeting sepsis criteria with tachycardia, tachypnea, leukocytosis, and evidence of end organ dysfunction on admission, now with VS more stable and labs improving.  These findings may be otherwise explained by patient's substance use, but will treat for sepsis presumptively for now and reevaluate as patient becomes more clinically stable. - 2 g CTX daily - f/u urine and blood Cx (blood Cx obtained after Abx initiated) - Daily CBC -  Transfer from stepdown ICU to Med-Tele   Polysubstance use/polypharmacy Patient has multiple centrally active medicines that she takes as noted below which likely contributed to her presentation as well as cocaine on UDS and new Dilaudid prescription started 2 days ago. - Holding meloxicam, duloxetine, olanzapine, Dilaudid due to concern for polypharmacy - Given improved mental status, now restarting ropinirole 1 mg daily, and gabapentin 400 mg TID (half doses from home meds)   Acute encephalopathy, resolved Likely multifactorial in setting of UTI, respiratory distress, and polysubstance use.  Mental status greatly improved and patient alert and oriented, following instructions now. - Delirium precautions  Hypomagnesemia Hypokalemia Prolonged QT interval Patient experienced SVT and had prolonged QT (QTc 538) overnight.  Repeat EKG in AM with QTc 436 and with sinus tachycardia. - Goal K>4 and Mag>2, replete as necessary  AKI, resolved Hyponatremia, resolved Cr 1.46 on admission, baseline ~0.6.  Now back at baseline following IV rehydration.  Etiology was possible end organ dysfunction in setting of sepsis versus dehydration. - Hold ibuprofen, minimize renally toxic medications and contrast use - Stopped IVF - Strict I/Os and daily BMP   COPD Emphysema Lungs with wheezing on exam, suspect patient has poor lung aeration at baseline based on extensive emphysema history and pulmonary neoplasm.  Tachypnea more likely related to polypharmacy and sepsis.  Still oxygenating well, told patient to ask for breathing treatment when needed. - DuoNebs Q4h PRN - Albuterol rescue inhaler   Malignancy of lung LUL Patient has known LUL neoplasm and is undergoing sterotactic body radiation therapy per Radiation Oncology. -  CT chest w/ contrast ordered by radiation oncology, now cancelled in setting of AKI   Elevated transaminases, alkaline phosphatase, improving Given normal Tbili, these elevations  may be evidence of end organ dysfunction in setting of sepsis.  Trending down today, will CTM. - Daily CMP  CHF LVEF 55-60% in October 2023, improved from 40-45% in July 2023. - Caution with fluid resuscitation, limit IVF - Hold daily torsemide in setting of AKI  Restless leg syndrome Patient acutely uncomfortable due to restless legs overnight. - Restarting ropinirole 1 mg daily, and gabapentin 400 mg TID (half doses from home meds)   HLD - Rosuvastatin held in setting of elevated transaminases   T2DM Last A1c 7.0 in November 2023.  CBGs 110-170 inpatient with minimal SA insulin, currently stable. - f/u A1c - Metformin held inpatient - sSSI, CBGs Q4h   Anxiety Depression - Duloxetine held in setting of acute metabolic encephalopathy thought to be related to polypharmacy   BPD1 - Restarted ropinirole  - Olanzapine held in setting of acute metabolic encephalopathy thought to be related to polypharmacy   GERD - Pantoprazole 40 mg PO   Tobacco use disorder Per chart review, patient is actively smoking.  Will counsel tobacco cessation when patient more alert.   Obesity BMI 30.04 on admission, will discuss lifestyle modification when patient more alert.  DVT prophylaxis: [place] heparin Code Status: Full Family Communication: None present at bedside Disposition Plan: Patient is from: Home Anticipated d/c is to: Pending PT eval Anticipated d/c date is: 3/23 or 3/24 Patient currently: Pending IV abx treatment, improvement of electrolyte abnormalities Admission Status is: Inpatient Remains inpatient appropriate because: IV abx treatment for sepsis, UTI, and electrolyte abnormalities  Consultants:  None  Procedures:  None  Antimicrobials:  None  Subjective: Patient seen and evaluated today with no new acute complaints or concerns.  Experienced run of SVT and QTc was 538 overnight in setting of Magnesium 0.8 and K 2.9, repleted and rechecked.  On evaluation in the morning,  patient feels well and is mentating much better than yesterday.  She is alert and oriented, states that she has been drinking lots of water because her mouth felt dry today.  She denies any shortness of breath, chest pain, nausea/vomiting, or other discomfort.  Objective: Vitals:   09/24/22 0500 09/24/22 0511 09/24/22 0600 09/24/22 0700  BP: (!) 119/52  (!) 111/52 (!) 116/49  Pulse: (!) 109  (!) 113 (!) 110  Resp: 19  (!) 31 20  Temp:  (!) 97.5 F (36.4 C)    TempSrc:  Axillary    SpO2: 97%  95% 96%  Weight:  84.2 kg    Height:        Intake/Output Summary (Last 24 hours) at 09/24/2022 0741 Last data filed at 09/24/2022 0328 Gross per 24 hour  Intake 514.05 ml  Output 650 ml  Net -135.95 ml   Filed Weights   09/23/22 1011 09/23/22 2122 09/24/22 0511  Weight: 79.4 kg 83.1 kg 84.2 kg    Examination: General: Adult female resting in bed in no acute distress, mild agitation, appears older than stated age. HEENT: NCAT.  PERRLA.  No mucosal pallor. Lungs: Tachypneic on 4 L [place].  Scattered and expiratory wheezing.  Some referred upper airway sounds. Cardiovascular: Regular rhythm, tachycardic.  Normal S1/S2. Abdomen: Soft, nondistended.  Nontender to palpation. Extremities: No peripheral edema bilaterally. No cyanosis or clubbing. Skin: Abrasion noted on dorsal surface of R hand with dried blood but no pain or erythema.  Otherwise some scattered areas of skin hypopigmentation but no rashes or other lesions. Neuro: Alert and oriented x 4.  No focal neurological deficit.  Equal strength and sensation bilaterally. Restless legs, constantly moving.  Data Reviewed: I have personally reviewed following labs and imaging studies.  CBC: Recent Labs  Lab 09/23/22 1113 09/24/22 0407  WBC 14.5* 10.6*  NEUTROABS 10.9*  --   HGB 11.7* 10.9*  HCT 35.0* 33.0*  MCV 93.3 93.2  PLT 315 [id number]   Basic Metabolic Panel: Recent Labs  Lab 09/23/22 1113 09/23/22 2324 09/24/22 0407  NA 133*  --   138  K 4.0  --  2.9*  CL 98  --  104  CO2 22  --  22  GLUCOSE 100*  --  85  BUN 19  --  11  CREATININE 1.46*  --  0.64  CALCIUM 10.4*  --  10.1  MG  --  0.8* 1.4*   GFR: Estimated Creatinine Clearance: 71.6 mL/min (by C-G formula based on SCr of 0.64 mg/dL). Liver Function Tests: Recent Labs  Lab 09/23/22 1113 09/24/22 0407  AST 134* 97*  ALT 59* 49*  ALKPHOS 222* 182*  BILITOT 0.6 1.0  PROT 6.7 5.9*  ALBUMIN 3.6 3.1*   No results for input(s): "LIPASE", "AMYLASE" in the last 168 hours. Recent Labs  Lab 09/23/22 1113  AMMONIA 25   Coagulation Profile: No results for input(s): "INR", "PROTIME" in the last 168 hours. Cardiac Enzymes: No results for input(s): "CKTOTAL", "CKMB", "CKMBINDEX", "TROPONINI" in the last 168 hours. BNP (last 3 results) No results for input(s): "PROBNP" in the last 8760 hours. HbA1C: No results for input(s): "HGBA1C" in the last 72 hours. CBG: Recent Labs  Lab 09/23/22 1022 09/23/22 2118 09/23/22 2327 09/24/22 0419  GLUCAP 144* 145* 117* 92   Lipid Profile: No results for input(s): "CHOL", "HDL", "LDLCALC", "TRIG", "CHOLHDL", "LDLDIRECT" in the last 72 hours. Thyroid Function Tests: Recent Labs    09/23/22 1113  TSH 1.264   Anemia Panel: No results for input(s): "VITAMINB12", "FOLATE", "FERRITIN", "TIBC", "IRON", "RETICCTPCT" in the last 72 hours. Sepsis Labs: No results for input(s): "PROCALCITON", "LATICACIDVEN" in the last 168 hours.  Recent Results (from the past 240 hour(s))  Culture, blood (Routine X 2) w Reflex to ID Panel     Status: None (Preliminary result)   Collection Time: 09/23/22  5:00 PM   Specimen: Blood  Result Value Ref Range Status   Specimen Description BLOOD LEFT HAND  Final   Special Requests   Final    BOTTLES DRAWN AEROBIC AND ANAEROBIC Blood Culture adequate volume Performed at [facility], [address], [place], [place] [address]    Culture PENDING  Incomplete   Report Status PENDING  Incomplete   Culture, blood (Routine X 2) w Reflex to ID Panel     Status: None (Preliminary result)   Collection Time: 09/23/22  5:22 PM   Specimen: Blood  Result Value Ref Range Status   Specimen Description BLOOD BLOOD LEFT HAND  Final   Special Requests   Final    BOTTLES DRAWN AEROBIC ONLY Blood Culture adequate volume Performed at [facility] [address], [place], [place] [address]    Culture PENDING  Incomplete   Report Status PENDING  Incomplete  MRSA Next Gen by PCR, Nasal     Status: None   Collection Time: 09/23/22  9:12 PM   Specimen: Nasal Mucosa; Nasal Swab  Result Value Ref Range Status   MRSA by  PCR Next Gen NOT DETECTED NOT DETECTED Final    Comment: (NOTE) The GeneXpert MRSA Assay (FDA approved for NASAL specimens only), is one component of a comprehensive MRSA colonization surveillance program. It is not intended to diagnose MRSA infection nor to guide or monitor treatment for MRSA infections. Test performance is not FDA approved in patients less than 51 years old. Performed at [facility], [address] [place],  [address]      Radiology Studies: DG Pelvis Portable  Result Date: 09/23/2022 CLINICAL DATA:  [place] in bathroom at 0400 hours today, history lung cancer EXAM: PORTABLE PELVIS 1-2 VIEWS COMPARISON:  Portable exam 1137 hours without priors for comparison FINDINGS: Osseous demineralization. SI joints preserved. Asymmetric narrowing of LEFT hip joint consistent with degenerative changes. RIGHT hip joint space preserved. No acute fracture, dislocation, or bone destruction. Scattered vascular calcifications. IMPRESSION: Degenerative changes LEFT hip joint with osseous demineralization. No acute abnormalities. Electronically Signed   By: Lavonia Dana M.D.   On: 09/23/2022 12:02   DG Chest Port 1 View  Result Date: 09/23/2022 CLINICAL DATA:  [place] in bathroom at 0400 hours today, no loss of consciousness, history lung cancer EXAM: PORTABLE CHEST 1 VIEW COMPARISON:   Portable exam 1140 hours compared to 01/12/2022 FINDINGS: Normal heart size, mediastinal contours, and pulmonary vascularity. Nodular focus lateral LEFT upper lobe again identified, 15 mm diameter, corresponding to known cavitary lesion on prior CT. Minimal biapical scarring. Lungs otherwise clear. No acute infiltrate, pleural effusion, or pneumothorax. Bones demineralized. IMPRESSION: LEFT upper lobe nodule consistent with known cavitary neoplasm. No acute abnormalities. Electronically Signed   By: Lavonia Dana M.D.   On: 09/23/2022 12:01   CT Head Wo Contrast  Result Date: 09/23/2022 CLINICAL DATA:  Fall EXAM: CT HEAD WITHOUT CONTRAST CT CERVICAL SPINE WITHOUT CONTRAST TECHNIQUE: Multidetector CT imaging of the head and cervical spine was performed following the standard protocol without intravenous contrast. Multiplanar CT image reconstructions of the cervical spine were also generated. RADIATION DOSE REDUCTION: This exam was performed according to the departmental dose-optimization program which includes automated exposure control, adjustment of the mA and/or kV according to patient size and/or use of iterative reconstruction technique. COMPARISON:  CT facial bones 12/12/2016, cervical spine radiographs 10/05/2016 FINDINGS: CT HEAD FINDINGS Brain: There is no acute intracranial hemorrhage, extra-axial fluid collection, or acute infarct. Parenchymal volume is normal. The ventricles are normal in size. Gray-white differentiation is preserved. The pituitary and suprasellar region are normal. There is no mass lesion. There is no mass effect or midline shift. Vascular: There is calcification of the bilateral carotid siphons and vertebral arteries. Skull: Normal. Negative for fracture or focal lesion. Sinuses/Orbits: The paranasal sinuses are clear. Bilateral lens implants are in place. The globes and orbits are otherwise unremarkable. Other: None. CT CERVICAL SPINE FINDINGS Alignment: There is a reversal of the  normal cervical curvature. There is no jumped or perched facet or other evidence of traumatic malalignment. Skull base and vertebrae: Skull base alignment is maintained. Vertebral body heights are preserved. There is no evidence of acute fracture. There is no suspicious osseous lesion. Soft tissues and spinal canal: No prevertebral fluid or swelling. No visible canal hematoma. Disc levels: There is multilevel disc space narrowing and degenerative endplate change, most advanced at C5-C6 and C6-C7. There is multilevel facet arthropathy, most advanced at C2-C3 and C3-C4. There is ossification of the posterior longitudinal ligament at C5-C6 resulting in at least mild-to-moderate spinal canal stenosis. Upper chest: The previously seen solid lesion in  the left apex on the CT from 07/13/2022 now demonstrates internal cavitation and is similar in appearance to the more remote prior CT from 03/22/2022. Other: None. IMPRESSION: 1. No acute intracranial pathology. 2. No acute fracture or traumatic malalignment of the cervical spine. 3. The previously seen solid lesion in the left apex now demonstrates internal cavitation, similar in appearance to the more remote CT chest from 03/22/2022. Malignancy remains a possibility. Electronically Signed   By: Valetta Mole M.D.   On: 09/23/2022 11:52   CT Cervical Spine Wo Contrast  Result Date: 09/23/2022 CLINICAL DATA:  Fall EXAM: CT HEAD WITHOUT CONTRAST CT CERVICAL SPINE WITHOUT CONTRAST TECHNIQUE: Multidetector CT imaging of the head and cervical spine was performed following the standard protocol without intravenous contrast. Multiplanar CT image reconstructions of the cervical spine were also generated. RADIATION DOSE REDUCTION: This exam was performed according to the departmental dose-optimization program which includes automated exposure control, adjustment of the mA and/or kV according to patient size and/or use of iterative reconstruction technique. COMPARISON:  CT facial  bones 12/12/2016, cervical spine radiographs 10/05/2016 FINDINGS: CT HEAD FINDINGS Brain: There is no acute intracranial hemorrhage, extra-axial fluid collection, or acute infarct. Parenchymal volume is normal. The ventricles are normal in size. Gray-white differentiation is preserved. The pituitary and suprasellar region are normal. There is no mass lesion. There is no mass effect or midline shift. Vascular: There is calcification of the bilateral carotid siphons and vertebral arteries. Skull: Normal. Negative for fracture or focal lesion. Sinuses/Orbits: The paranasal sinuses are clear. Bilateral lens implants are in place. The globes and orbits are otherwise unremarkable. Other: None. CT CERVICAL SPINE FINDINGS Alignment: There is a reversal of the normal cervical curvature. There is no jumped or perched facet or other evidence of traumatic malalignment. Skull base and vertebrae: Skull base alignment is maintained. Vertebral body heights are preserved. There is no evidence of acute fracture. There is no suspicious osseous lesion. Soft tissues and spinal canal: No prevertebral fluid or swelling. No visible canal hematoma. Disc levels: There is multilevel disc space narrowing and degenerative endplate change, most advanced at C5-C6 and C6-C7. There is multilevel facet arthropathy, most advanced at C2-C3 and C3-C4. There is ossification of the posterior longitudinal ligament at C5-C6 resulting in at least mild-to-moderate spinal canal stenosis. Upper chest: The previously seen solid lesion in the left apex on the CT from 07/13/2022 now demonstrates internal cavitation and is similar in appearance to the more remote prior CT from 03/22/2022. Other: None. IMPRESSION: 1. No acute intracranial pathology. 2. No acute fracture or traumatic malalignment of the cervical spine. 3. The previously seen solid lesion in the left apex now demonstrates internal cavitation, similar in appearance to the more remote CT chest from  03/22/2022. Malignancy remains a possibility. Electronically Signed   By: Valetta Mole M.D.   On: 09/23/2022 11:52    Scheduled Meds:  Chlorhexidine Gluconate Cloth  6 each Topical Q0600   clopidogrel  75 mg Oral Daily   heparin  5,000 Units Subcutaneous Q8H   insulin aspart  0-9 Units Subcutaneous Q4H   pantoprazole  40 mg Oral Daily   Continuous Infusions:  sodium chloride 100 mL/hr at 09/24/22 0328   cefTRIAXone (ROCEPHIN)  IV     potassium chloride 10 mEq (09/24/22 0647)     LOS: 1 day   Time spent: 35 minutes  Solmon Bohr, MS4 [facility]  If 7PM-7AM, please contact night-coverage [email or web address] 09/24/2022, 7:41 AM

## 2022-09-24 NOTE — Telephone Encounter (Signed)
Returned call to husband. Notified that cardiology has not been consulted at this time. Husband thankful for the call back.

## 2022-09-24 NOTE — Progress Notes (Addendum)
Progress note  RN called due to patient's heart rate in the 140s, EKG was done and it showed SVT at a rate of 139 bpm with QTc of 538 ms.  Patient was noted to be positive for opioid as well as cocaine.  At bedside she was somnolent, but was arousable with light sternal rub and responds for a little bit but quickly goes back to sleep.  Labs were reviewed, magnesium was checked and it was noted to be low at 0.8, this was replenished with improvement of magnesium to 1.4 and HR improved to 110s.  Patient was further replenished.  BMP this morning showed potassium of 2.9, this was replenished. Patient continues to remain stable with current BP of 111/52 and MAP of 71.  Physical Exam  BP (!) 111/52   Pulse (!) 113   Temp (!) 97.5 F (36.4 C) (Axillary)   Resp (!) 31   Ht 5\' 4"  (1.626 m)   Wt 84.2 kg   SpO2 95%   BMI 31.86 kg/m   Gen:-Somnolent, though arousable with mild sternal rub HEENT:- [place].AT, No sclera icterus Neck-Supple Neck,No JVD,.  Lungs-  CTAB  CV-tachycardia.  S1, S2 normal Abd-  +ve B.Sounds, Abd Soft, No tenderness,    Extremity/Skin:-Skin warm and dry    Psych-this cannot be evaluated at this time due to patient being somnolent Neuro-this cannot be evaluated at this time due to patient being somnolent  Assessment and plan Hypomagnesemia Magnesium was 0.8, this was replenished with improvement 1.4, this was further replenished  Prolonged QT intervals QTc 538 ms, magnesium level was noted to be low and this was replenished as described above Continue telemetry and repeat EKG in the morning  Supraventricular tachycardia HR was in the 40s, but is improved to 110s s/p magnesium replenishment   Total time:  22 minutes This includes time reviewing the chart including progress notes, labs, EKGs, taking medical decisions, ordering labs and documenting findings.

## 2022-09-25 ENCOUNTER — Inpatient Hospital Stay (HOSPITAL_COMMUNITY): Payer: 59

## 2022-09-25 DIAGNOSIS — R9431 Abnormal electrocardiogram [ECG] [EKG]: Secondary | ICD-10-CM

## 2022-09-25 DIAGNOSIS — A419 Sepsis, unspecified organism: Secondary | ICD-10-CM | POA: Diagnosis not present

## 2022-09-25 LAB — BASIC METABOLIC PANEL
Anion gap: 7 (ref 5–15)
BUN: 7 mg/dL — ABNORMAL LOW (ref 8–23)
CO2: 26 mmol/L (ref 22–32)
Calcium: 10.2 mg/dL (ref 8.9–10.3)
Chloride: 106 mmol/L (ref 98–111)
Creatinine, Ser: 0.44 mg/dL (ref 0.44–1.00)
GFR, Estimated: >60 mL/min (ref >60)
Glucose, Bld: 128 mg/dL — ABNORMAL HIGH (ref 70–99)
Potassium: 3.6 mmol/L (ref 3.5–5.1)
Sodium: 139 mmol/L (ref 135–145)

## 2022-09-25 LAB — GLUCOSE, CAPILLARY
Glucose-Capillary: 175 mg/dL — ABNORMAL HIGH (ref 70–99)
Glucose-Capillary: 113 mg/dL — ABNORMAL HIGH (ref 70–99)
Glucose-Capillary: 126 mg/dL — ABNORMAL HIGH (ref 70–99)
Glucose-Capillary: 175 mg/dL — ABNORMAL HIGH (ref 70–99)
Glucose-Capillary: 182 mg/dL — ABNORMAL HIGH (ref 70–99)
Glucose-Capillary: 144 mg/dL — ABNORMAL HIGH (ref 70–99)

## 2022-09-25 LAB — CBC
HCT: 29 % — ABNORMAL LOW (ref 36.0–46.0)
Hemoglobin: 9.5 g/dL — ABNORMAL LOW (ref 12.0–15.0)
MCH: 30.7 pg (ref 26.0–34.0)
MCHC: 32.8 g/dL (ref 30.0–36.0)
MCV: 93.9 fL (ref 80.0–100.0)
Platelets: 279 10*3/uL (ref 150–400)
RBC: 3.09 MIL/uL — ABNORMAL LOW (ref 3.87–5.11)
RDW: 15.8 % — ABNORMAL HIGH (ref 11.5–15.5)
WBC: 8.3 10*3/uL (ref 4.0–10.5)
nRBC: 0 % (ref 0.0–0.2)

## 2022-09-25 LAB — ECHOCARDIOGRAM COMPLETE
Area-P 1/2: 4.79 cm2
Calc EF: 74.8 %
Height: 64 in
S' Lateral: 2.5 cm
Single Plane A2C EF: 77.3 %
Single Plane A4C EF: 75.7 %
Weight: 2952.4 oz

## 2022-09-25 LAB — HEMOGLOBIN A1C
Hgb A1c MFr Bld: 5.9 % — ABNORMAL HIGH (ref 4.8–5.6)
Mean Plasma Glucose: 123 mg/dL

## 2022-09-25 LAB — URINE CULTURE: Culture: >=100000 — AB

## 2022-09-25 LAB — MAGNESIUM: Magnesium: 1.2 mg/dL — ABNORMAL LOW (ref 1.7–2.4)

## 2022-09-25 MED ORDER — LEVALBUTEROL HCL 0.63 MG/3ML IN NEBU
0.6300 mg | INHALATION_SOLUTION | Freq: Three times a day (TID) | RESPIRATORY_TRACT | Status: DC
  Administered 2022-09-25 – 2022-09-26 (×4): 0.63 mg via RESPIRATORY_TRACT
  Filled 2022-09-25 (×4): qty 3

## 2022-09-25 MED ORDER — CEFAZOLIN SODIUM-DEXTROSE 2-4 GM/100ML-% IV SOLN: 2.0000 g | Freq: Three times a day (TID) | INTRAVENOUS | Status: DC

## 2022-09-25 MED ORDER — MAGNESIUM SULFATE 2 GM/50ML IV SOLN
2.0000 g | Freq: Once | INTRAVENOUS | Status: AC
  Administered 2022-09-25: 2 g via INTRAVENOUS
  Filled 2022-09-25: qty 50

## 2022-09-25 MED ORDER — CEFAZOLIN SODIUM-DEXTROSE 1-4 GM/50ML-% IV SOLN
1.0000 g | Freq: Three times a day (TID) | INTRAVENOUS | Status: DC
  Administered 2022-09-26: 1 g via INTRAVENOUS
  Filled 2022-09-25 (×5): qty 50

## 2022-09-25 MED ORDER — POTASSIUM CHLORIDE CRYS ER 20 MEQ PO TBCR
40.0000 meq | EXTENDED_RELEASE_TABLET | Freq: Once | ORAL | Status: AC
  Administered 2022-09-25: 40 meq via ORAL
  Filled 2022-09-25: qty 2

## 2022-09-25 NOTE — Progress Notes (Signed)
PROGRESS NOTE    PEARL CLAMP  [id number] DOB: April 05, 1956 DOA: 09/23/2022 PCP: Yves Dill, NP  Brief Narrative:  [name] is a 67 y.o. female with medical history significant for severe COPD on 4 L O2, emphysema, LUL lung malignancy undergoing SBRT (last 3/19), tobacco use disorder, BPD1, CHF, HTN, and more brought to ED by EMS acutely altered and following fall at home 3/21 without LOC or head impact.  Patient presented to the ED somnolent and with slurred speech.  Accompanied by husband who reported that she was started on 2 mg Dilaudid 2 days ago and has been more lethargic with slurred speech since.  CBG, ethanol, and ammonia WNL.  Transaminases and alk phosphatase elevated.  Cr at 1.46 (~0.8 baseline).  CT head and neck without acute intracranial pathology, showing known solid lung lesion.  Patient became more alert after 1 dose naloxone, later became more somnolent again.  UDS positive for cocaine and opiates.  UA remarkable for UTI.  Hospitalist team consulted and patient admitted for suspected sepsis secondary to UTI and AKI.  Started on 2 g CTX Q24h.  Mental status greatly improved 3/22, continuing IV antibiotics and repleting electrolytes.  Problem List:   Principal Problem:   Sepsis ([place]) Active Problems:   Hyperlipidemia   COPD GOLD 2/ still smoker    Tobacco use disorder   Bipolar 1 disorder ([place])   Anxiety   Polysubstance abuse ([place])  Assessment and Plan:  Sepsis, improved Urinary tract infection Patient meeting sepsis criteria with tachycardia, tachypnea, leukocytosis, and evidence of end organ dysfunction on admission, now with VS more stable and labs improving.  These findings may be otherwise explained by patient's substance use, but will treat for sepsis presumptively for now and reevaluate as patient becomes more clinically stable. - 2 g CTX daily - f/u urine and blood Cx (blood Cx obtained after Abx initiated) - Daily CBC -  Transfer from stepdown ICU to Med-Tele   Polysubstance use/polypharmacy Patient has multiple centrally active medicines that she takes as noted below which likely contributed to her presentation as well as cocaine on UDS and new Dilaudid prescription started 2 days ago. - Holding meloxicam, duloxetine, olanzapine, Dilaudid due to concern for polypharmacy - Given improved mental status, now restarting ropinirole 1 mg daily, and gabapentin 400 mg TID (half doses from home meds)   Acute encephalopathy, resolved Likely multifactorial in setting of UTI, respiratory distress, and polysubstance use.  Mental status greatly improved and patient alert and oriented, following instructions now. - Delirium precautions  Hypomagnesemia Hypokalemia Prolonged QT interval Patient experienced SVT and had prolonged QT (QTc 538) overnight.  Repeat EKG in AM with QTc 436 and with sinus tachycardia. - Goal K>4 and Mag>2, replete as necessary  AKI, resolved Hyponatremia, resolved Cr 1.46 on admission, baseline ~0.6.  Now back at baseline following IV rehydration.  Etiology was possible end organ dysfunction in setting of sepsis versus dehydration. - Hold ibuprofen, minimize renally toxic medications and contrast use - Stopped IVF - Strict I/Os and daily BMP   COPD Emphysema Lungs with wheezing on exam, suspect patient has poor lung aeration at baseline based on extensive emphysema history and pulmonary neoplasm.  Tachypnea more likely related to polypharmacy and sepsis.  Still oxygenating well, told patient to ask for breathing treatment when needed. - DuoNebs Q4h PRN - Albuterol rescue inhaler   Malignancy of lung LUL Patient has known LUL neoplasm and is undergoing sterotactic body radiation therapy per Radiation Oncology. -  CT chest w/ contrast ordered by radiation oncology, now cancelled in setting of AKI -Now with wheezing for which DuoNebs ordered   Elevated transaminases, alkaline phosphatase,  improving Given normal Tbili, these elevations may be evidence of end organ dysfunction in setting of sepsis.  Trending down today, will CTM. - Daily CMP  CHF LVEF 55-60% in October 2023, improved from 40-45% in July 2023. - Caution with fluid resuscitation, limit IVF - Hold daily torsemide in setting of AKI -Check 2D echocardiogram given tachycardia issues  Restless leg syndrome Patient acutely uncomfortable due to restless legs overnight. - Restarting ropinirole 1 mg daily, and gabapentin 400 mg TID (half doses from home meds)   HLD - Rosuvastatin held in setting of elevated transaminases   T2DM Last A1c 7.0 in November 2023.  CBGs 110-170 inpatient with minimal SA insulin, currently stable. -A1c 5.9% - Metformin held inpatient - sSSI, CBGs Q4h   Anxiety Depression - Duloxetine held in setting of acute metabolic encephalopathy thought to be related to polypharmacy   BPD1 - Restarted ropinirole  - Olanzapine held in setting of acute metabolic encephalopathy thought to be related to polypharmacy   GERD - Pantoprazole 40 mg PO   Tobacco use disorder Per chart review, patient is actively smoking.  Will counsel tobacco cessation when patient more alert.   Obesity BMI 30.04 on admission, will discuss lifestyle modification when patient more alert.  DVT prophylaxis: [place] heparin Code Status: Full Family Communication: Husband at bedside 3/23 Disposition Plan: Patient is from: Home Anticipated d/c is to: Home health PT Anticipated d/c date is: 3/24 Patient currently: Pending IV abx treatment, improvement of electrolyte abnormalities Admission Status is: Inpatient Remains inpatient appropriate because: IV abx treatment for sepsis, UTI, and electrolyte abnormalities  Consultants:  None  Procedures:  None  Antimicrobials:  None  Subjective: Patient seen and evaluated today with no new acute complaints or concerns.  She continues to remain tachycardic and continues to  have hypomagnesemia.  No significant pain concerns noted.  Objective: Vitals:   09/25/22 0400 09/25/22 0500 09/25/22 0600 09/25/22 0732  BP: (!) 123/90 (!) 148/67 (!) 171/56   Pulse: 93 (!) 101 (!) 108 (!) 107  Resp: 20 17 (!) 40 (!) 21  Temp: 98.5 F (36.9 C)   97.6 F (36.4 C)  TempSrc: Axillary   Axillary  SpO2: 99% 95% 92% 99%  Weight: 83.7 kg     Height:        Intake/Output Summary (Last 24 hours) at 09/25/2022 0734 Last data filed at 09/24/2022 1538 Gross per 24 hour  Intake 1493.76 ml  Output 400 ml  Net 1093.76 ml   Filed Weights   09/23/22 2122 09/24/22 0511 09/25/22 0400  Weight: 83.1 kg 84.2 kg 83.7 kg    Examination: General: Adult female resting in bed in no acute distress, mild agitation, appears older than stated age. HEENT: NCAT.  PERRLA.  No mucosal pallor. Lungs: Tachypneic on 4 L [place].  Scattered and expiratory wheezing.  Some referred upper airway sounds. Cardiovascular: Regular rhythm, tachycardic.  Normal S1/S2. Abdomen: Soft, nondistended.  Nontender to palpation. Extremities: No peripheral edema bilaterally. No cyanosis or clubbing. Skin: Abrasion noted on dorsal surface of R hand with dried blood but no pain or erythema.  Otherwise some scattered areas of skin hypopigmentation but no rashes or other lesions. Neuro: Alert and oriented x 4.  No focal neurological deficit.  Equal strength and sensation bilaterally. Restless legs, constantly moving.  Data Reviewed: I have personally  reviewed following labs and imaging studies.  CBC: Recent Labs  Lab 09/23/22 1113 09/24/22 0407 09/25/22 0349  WBC 14.5* 10.6* 8.3  NEUTROABS 10.9*  --   --   HGB 11.7* 10.9* 9.5*  HCT 35.0* 33.0* 29.0*  MCV 93.3 93.2 93.9  PLT 315 332 [id number]   Basic Metabolic Panel: Recent Labs  Lab 09/23/22 1113 09/23/22 2324 09/24/22 0407 09/24/22 1238 09/25/22 0349  NA 133*  --  138  --  139  K 4.0  --  2.9* 3.4* 3.6  CL 98  --  104  --  106  CO2 22  --  22  --  26   GLUCOSE 100*  --  85  --  128*  BUN 19  --  11  --  7*  CREATININE 1.46*  --  0.64  --  0.44  CALCIUM 10.4*  --  10.1  --  10.2  MG  --  0.8* 1.4* 1.3* 1.2*   GFR: Estimated Creatinine Clearance: 71.4 mL/min (by C-G formula based on SCr of 0.44 mg/dL). Liver Function Tests: Recent Labs  Lab 09/23/22 1113 09/24/22 0407  AST 134* 97*  ALT 59* 49*  ALKPHOS 222* 182*  BILITOT 0.6 1.0  PROT 6.7 5.9*  ALBUMIN 3.6 3.1*   No results for input(s): "LIPASE", "AMYLASE" in the last 168 hours. Recent Labs  Lab 09/23/22 1113  AMMONIA 25   Coagulation Profile: No results for input(s): "INR", "PROTIME" in the last 168 hours. Cardiac Enzymes: No results for input(s): "CKTOTAL", "CKMB", "CKMBINDEX", "TROPONINI" in the last 168 hours. BNP (last 3 results) No results for input(s): "PROBNP" in the last 8760 hours. HbA1C: Recent Labs    09/23/22 1113  HGBA1C 5.9*   CBG: Recent Labs  Lab 09/24/22 1111 09/24/22 1532 09/24/22 1926 09/25/22 0149 09/25/22 0402  GLUCAP 164* 189* 213* 175* 113*   Lipid Profile: No results for input(s): "CHOL", "HDL", "LDLCALC", "TRIG", "CHOLHDL", "LDLDIRECT" in the last 72 hours. Thyroid Function Tests: Recent Labs    09/23/22 1113  TSH 1.264   Anemia Panel: No results for input(s): "VITAMINB12", "FOLATE", "FERRITIN", "TIBC", "IRON", "RETICCTPCT" in the last 72 hours. Sepsis Labs: No results for input(s): "PROCALCITON", "LATICACIDVEN" in the last 168 hours.  Recent Results (from the past 240 hour(s))  Urine Culture     Status: Abnormal   Collection Time: 09/23/22  2:29 PM   Specimen: Urine, Clean Catch  Result Value Ref Range Status   Specimen Description   Final    URINE, CLEAN CATCH Performed at [facility], [address], [place], [place] [address]    Special Requests   Final    NONE Performed at [facility], [address] [place], [place] [address]    Culture >=100,000 COLONIES/mL ESCHERICHIA COLI (A)  Final   Report Status  09/25/2022 FINAL  Final   Organism ID, Bacteria ESCHERICHIA COLI (A)  Final      Susceptibility   Escherichia coli - MIC*    AMPICILLIN >=32 RESISTANT Resistant     CEFAZOLIN <=4 SENSITIVE Sensitive     CEFEPIME <=0.12 SENSITIVE Sensitive     CEFTRIAXONE <=0.25 SENSITIVE Sensitive     CIPROFLOXACIN <=0.25 SENSITIVE Sensitive     GENTAMICIN <=1 SENSITIVE Sensitive     IMIPENEM <=0.25 SENSITIVE Sensitive     NITROFURANTOIN <=16 SENSITIVE Sensitive     TRIMETH/SULFA >=320 RESISTANT Resistant     AMPICILLIN/SULBACTAM >=32 RESISTANT Resistant     PIP/TAZO <=4 SENSITIVE Sensitive     * >=  100,000 COLONIES/mL ESCHERICHIA COLI  Culture, blood (Routine X 2) w Reflex to ID Panel     Status: None (Preliminary result)   Collection Time: 09/23/22  5:00 PM   Specimen: BLOOD LEFT HAND  Result Value Ref Range Status   Specimen Description BLOOD LEFT HAND  Final   Special Requests   Final    BOTTLES DRAWN AEROBIC AND ANAEROBIC Blood Culture adequate volume   Culture   Final    NO GROWTH 2 DAYS Performed at [facility], [address], [place], [place] [address]    Report Status PENDING  Incomplete  Culture, blood (Routine X 2) w Reflex to ID Panel     Status: None (Preliminary result)   Collection Time: 09/23/22  5:22 PM   Specimen: BLOOD  Result Value Ref Range Status   Specimen Description BLOOD BLOOD LEFT HAND  Final   Special Requests   Final    BOTTLES DRAWN AEROBIC ONLY Blood Culture adequate volume   Culture   Final    NO GROWTH 2 DAYS Performed at [facility], [address] [place], [place] [address]    Report Status PENDING  Incomplete  MRSA Next Gen by PCR, Nasal     Status: None   Collection Time: 09/23/22  9:12 PM   Specimen: Nasal Mucosa; Nasal Swab  Result Value Ref Range Status   MRSA by PCR Next Gen NOT DETECTED NOT DETECTED Final    Comment: (NOTE) The GeneXpert MRSA Assay (FDA approved for NASAL specimens only), is one component of a comprehensive MRSA colonization  surveillance program. It is not intended to diagnose MRSA infection nor to guide or monitor treatment for MRSA infections. Test performance is not FDA approved in patients less than 40 years old. Performed at [facility], [address], [place], [place] [address]      Radiology Studies: DG Pelvis Portable  Result Date: 09/23/2022 CLINICAL DATA:  [place] in bathroom at 0400 hours today, history lung cancer EXAM: PORTABLE PELVIS 1-2 VIEWS COMPARISON:  Portable exam 1137 hours without priors for comparison FINDINGS: Osseous demineralization. SI joints preserved. Asymmetric narrowing of LEFT hip joint consistent with degenerative changes. RIGHT hip joint space preserved. No acute fracture, dislocation, or bone destruction. Scattered vascular calcifications. IMPRESSION: Degenerative changes LEFT hip joint with osseous demineralization. No acute abnormalities. Electronically Signed   By: Lavonia Dana M.D.   On: 09/23/2022 12:02   DG Chest Port 1 View  Result Date: 09/23/2022 CLINICAL DATA:  [place] in bathroom at 0400 hours today, no loss of consciousness, history lung cancer EXAM: PORTABLE CHEST 1 VIEW COMPARISON:  Portable exam 1140 hours compared to 01/12/2022 FINDINGS: Normal heart size, mediastinal contours, and pulmonary vascularity. Nodular focus lateral LEFT upper lobe again identified, 15 mm diameter, corresponding to known cavitary lesion on prior CT. Minimal biapical scarring. Lungs otherwise clear. No acute infiltrate, pleural effusion, or pneumothorax. Bones demineralized. IMPRESSION: LEFT upper lobe nodule consistent with known cavitary neoplasm. No acute abnormalities. Electronically Signed   By: Lavonia Dana M.D.   On: 09/23/2022 12:01   CT Head Wo Contrast  Result Date: 09/23/2022 CLINICAL DATA:  Fall EXAM: CT HEAD WITHOUT CONTRAST CT CERVICAL SPINE WITHOUT CONTRAST TECHNIQUE: Multidetector CT imaging of the head and cervical spine was performed following the standard protocol without  intravenous contrast. Multiplanar CT image reconstructions of the cervical spine were also generated. RADIATION DOSE REDUCTION: This exam was performed according to the departmental dose-optimization program which includes automated exposure control, adjustment of the  mA and/or kV according to patient size and/or use of iterative reconstruction technique. COMPARISON:  CT facial bones 12/12/2016, cervical spine radiographs 10/05/2016 FINDINGS: CT HEAD FINDINGS Brain: There is no acute intracranial hemorrhage, extra-axial fluid collection, or acute infarct. Parenchymal volume is normal. The ventricles are normal in size. Gray-white differentiation is preserved. The pituitary and suprasellar region are normal. There is no mass lesion. There is no mass effect or midline shift. Vascular: There is calcification of the bilateral carotid siphons and vertebral arteries. Skull: Normal. Negative for fracture or focal lesion. Sinuses/Orbits: The paranasal sinuses are clear. Bilateral lens implants are in place. The globes and orbits are otherwise unremarkable. Other: None. CT CERVICAL SPINE FINDINGS Alignment: There is a reversal of the normal cervical curvature. There is no jumped or perched facet or other evidence of traumatic malalignment. Skull base and vertebrae: Skull base alignment is maintained. Vertebral body heights are preserved. There is no evidence of acute fracture. There is no suspicious osseous lesion. Soft tissues and spinal canal: No prevertebral fluid or swelling. No visible canal hematoma. Disc levels: There is multilevel disc space narrowing and degenerative endplate change, most advanced at C5-C6 and C6-C7. There is multilevel facet arthropathy, most advanced at C2-C3 and C3-C4. There is ossification of the posterior longitudinal ligament at C5-C6 resulting in at least mild-to-moderate spinal canal stenosis. Upper chest: The previously seen solid lesion in the left apex on the CT from 07/13/2022 now  demonstrates internal cavitation and is similar in appearance to the more remote prior CT from 03/22/2022. Other: None. IMPRESSION: 1. No acute intracranial pathology. 2. No acute fracture or traumatic malalignment of the cervical spine. 3. The previously seen solid lesion in the left apex now demonstrates internal cavitation, similar in appearance to the more remote CT chest from 03/22/2022. Malignancy remains a possibility. Electronically Signed   By: Valetta Mole M.D.   On: 09/23/2022 11:52   CT Cervical Spine Wo Contrast  Result Date: 09/23/2022 CLINICAL DATA:  Fall EXAM: CT HEAD WITHOUT CONTRAST CT CERVICAL SPINE WITHOUT CONTRAST TECHNIQUE: Multidetector CT imaging of the head and cervical spine was performed following the standard protocol without intravenous contrast. Multiplanar CT image reconstructions of the cervical spine were also generated. RADIATION DOSE REDUCTION: This exam was performed according to the departmental dose-optimization program which includes automated exposure control, adjustment of the mA and/or kV according to patient size and/or use of iterative reconstruction technique. COMPARISON:  CT facial bones 12/12/2016, cervical spine radiographs 10/05/2016 FINDINGS: CT HEAD FINDINGS Brain: There is no acute intracranial hemorrhage, extra-axial fluid collection, or acute infarct. Parenchymal volume is normal. The ventricles are normal in size. Gray-white differentiation is preserved. The pituitary and suprasellar region are normal. There is no mass lesion. There is no mass effect or midline shift. Vascular: There is calcification of the bilateral carotid siphons and vertebral arteries. Skull: Normal. Negative for fracture or focal lesion. Sinuses/Orbits: The paranasal sinuses are clear. Bilateral lens implants are in place. The globes and orbits are otherwise unremarkable. Other: None. CT CERVICAL SPINE FINDINGS Alignment: There is a reversal of the normal cervical curvature. There is no  jumped or perched facet or other evidence of traumatic malalignment. Skull base and vertebrae: Skull base alignment is maintained. Vertebral body heights are preserved. There is no evidence of acute fracture. There is no suspicious osseous lesion. Soft tissues and spinal canal: No prevertebral fluid or swelling. No visible canal hematoma. Disc levels: There is multilevel disc space narrowing and degenerative endplate change, most advanced  at C5-C6 and C6-C7. There is multilevel facet arthropathy, most advanced at C2-C3 and C3-C4. There is ossification of the posterior longitudinal ligament at C5-C6 resulting in at least mild-to-moderate spinal canal stenosis. Upper chest: The previously seen solid lesion in the left apex on the CT from 07/13/2022 now demonstrates internal cavitation and is similar in appearance to the more remote prior CT from 03/22/2022. Other: None. IMPRESSION: 1. No acute intracranial pathology. 2. No acute fracture or traumatic malalignment of the cervical spine. 3. The previously seen solid lesion in the left apex now demonstrates internal cavitation, similar in appearance to the more remote CT chest from 03/22/2022. Malignancy remains a possibility. Electronically Signed   By: Valetta Mole M.D.   On: 09/23/2022 11:52    Scheduled Meds:  Chlorhexidine Gluconate Cloth  6 each Topical Q0600   clopidogrel  75 mg Oral Daily   gabapentin  400 mg Oral TID   heparin  5,000 Units Subcutaneous Q8H   insulin aspart  0-9 Units Subcutaneous Q4H   pantoprazole  40 mg Oral Daily   potassium chloride  40 mEq Oral Once   rOPINIRole  1 mg Oral TID   Continuous Infusions:  cefTRIAXone (ROCEPHIN)  IV Stopped (09/24/22 1017)   magnesium sulfate bolus IVPB       LOS: 2 days   Time spent: 35 minutes  Kristyanna Barcelo D Britany Callicott,DO [facility]  If 7PM-7AM, please contact night-coverage [email or web address] 09/25/2022, 7:34 AM

## 2022-09-25 NOTE — TOC Progression Note (Signed)
Transition of Care [facility]) - Progression Note    Patient Details  Name: [name] MRN: [id number] Date of Birth: September 05, 1955  Transition of Care [facility]) CM/SW Contact  Boneta Lucks, RN Phone Number: 09/25/2022, 3:50 PM  Clinical Narrative:   PT recommending HHPT. TOC waiting for a return call from patient. TOC following.    Expected Discharge Plan: [place] Barriers to Discharge: Continued Medical Work up  Expected Discharge Plan and Services       Social Determinants of Health (SDOH) Interventions SDOH Screenings   Food Insecurity: No Food Insecurity (09/23/2022)  Housing: Low Risk  (09/23/2022)  Transportation Needs: No Transportation Needs (09/23/2022)  Recent Concern: Transportation Needs - Unmet Transportation Needs (09/07/2022)  Utilities: Not At Risk (09/23/2022)  Alcohol Screen: Low Risk  (12/31/2020)  Depression (PHQ2-9): Medium Risk (12/31/2020)  Financial Resource Strain: High Risk (12/31/2020)  Physical Activity: Insufficiently Active (12/31/2020)  Social Connections: Moderately Isolated (12/31/2020)  Stress: Stress Concern Present (12/31/2020)  Tobacco Use: High Risk (09/23/2022)    Readmission Risk Interventions     No data to display

## 2022-09-25 NOTE — Progress Notes (Signed)
  Echocardiogram 2D Echocardiogram has been performed.  Bobbye Charleston 09/25/2022, 2:14 PM

## 2022-09-26 DIAGNOSIS — A419 Sepsis, unspecified organism: Secondary | ICD-10-CM | POA: Diagnosis not present

## 2022-09-26 LAB — BASIC METABOLIC PANEL
Anion gap: 8 (ref 5–15)
BUN: 6 mg/dL — ABNORMAL LOW (ref 8–23)
CO2: 27 mmol/L (ref 22–32)
Calcium: 10.6 mg/dL — ABNORMAL HIGH (ref 8.9–10.3)
Chloride: 102 mmol/L (ref 98–111)
Creatinine, Ser: 0.43 mg/dL — ABNORMAL LOW (ref 0.44–1.00)
GFR, Estimated: >60 mL/min (ref >60)
Glucose, Bld: 129 mg/dL — ABNORMAL HIGH (ref 70–99)
Potassium: 4.1 mmol/L (ref 3.5–5.1)
Sodium: 137 mmol/L (ref 135–145)

## 2022-09-26 LAB — CBC
HCT: 29.4 % — ABNORMAL LOW (ref 36.0–46.0)
Hemoglobin: 9.5 g/dL — ABNORMAL LOW (ref 12.0–15.0)
MCH: 31.5 pg (ref 26.0–34.0)
MCHC: 32.3 g/dL (ref 30.0–36.0)
MCV: 97.4 fL (ref 80.0–100.0)
Platelets: 294 10*3/uL (ref 150–400)
RBC: 3.02 MIL/uL — ABNORMAL LOW (ref 3.87–5.11)
RDW: 15.9 % — ABNORMAL HIGH (ref 11.5–15.5)
WBC: 8.2 10*3/uL (ref 4.0–10.5)
nRBC: 0 % (ref 0.0–0.2)

## 2022-09-26 LAB — GLUCOSE, CAPILLARY
Glucose-Capillary: 114 mg/dL — ABNORMAL HIGH (ref 70–99)
Glucose-Capillary: 123 mg/dL — ABNORMAL HIGH (ref 70–99)
Glucose-Capillary: 129 mg/dL — ABNORMAL HIGH (ref 70–99)
Glucose-Capillary: 131 mg/dL — ABNORMAL HIGH (ref 70–99)
Glucose-Capillary: 157 mg/dL — ABNORMAL HIGH (ref 70–99)

## 2022-09-26 LAB — MAGNESIUM: Magnesium: 1.2 mg/dL — ABNORMAL LOW (ref 1.7–2.4)

## 2022-09-26 MED ORDER — GUAIFENESIN-DM 100-10 MG/5ML PO SYRP: 5.0000 mL | ORAL_SOLUTION | ORAL | 0 refills | Status: DC | PRN

## 2022-09-26 MED ORDER — LEVALBUTEROL HCL 0.63 MG/3ML IN NEBU: 0.6300 mg | INHALATION_SOLUTION | Freq: Three times a day (TID) | RESPIRATORY_TRACT | 12 refills | Status: DC

## 2022-09-26 MED ORDER — ROPINIROLE HCL 1 MG PO TABS: 1.0000 mg | ORAL_TABLET | Freq: Three times a day (TID) | ORAL | 0 refills | Status: DC

## 2022-09-26 MED ORDER — MAGNESIUM OXIDE -MG SUPPLEMENT 400 (240 MG) MG PO TABS: 400.0000 mg | ORAL_TABLET | Freq: Two times a day (BID) | ORAL | 0 refills | Status: AC

## 2022-09-26 MED ORDER — BREZTRI AEROSPHERE 160-9-4.8 MCG/ACT IN AERO: 2.0000 | INHALATION_SPRAY | Freq: Two times a day (BID) | RESPIRATORY_TRACT | 1 refills | Status: DC

## 2022-09-26 MED ORDER — GABAPENTIN 400 MG PO CAPS: 400.0000 mg | ORAL_CAPSULE | Freq: Three times a day (TID) | ORAL | 0 refills | Status: DC

## 2022-09-26 MED ORDER — MAGNESIUM SULFATE 4 GM/100ML IV SOLN
4.0000 g | Freq: Once | INTRAVENOUS | Status: AC
  Administered 2022-09-26: 4 g via INTRAVENOUS
  Filled 2022-09-26: qty 100

## 2022-09-26 MED ORDER — MAGNESIUM OXIDE -MG SUPPLEMENT 400 (240 MG) MG PO TABS
400.0000 mg | ORAL_TABLET | Freq: Two times a day (BID) | ORAL | Status: DC
  Administered 2022-09-26: 400 mg via ORAL
  Filled 2022-09-26: qty 1

## 2022-09-26 NOTE — TOC Transition Note (Signed)
Transition of Care [facility]) - CM/SW Discharge Note   Patient Details  Name: [name] MRN: [id number] Date of Birth: Jan 17, 1956  Transition of Care [facility]) CM/SW Contact:  Leo Rod, LCSW Phone Number: 09/26/2022, 11:57 AM   Clinical Narrative:     Pt to discharge home today with [facility] PT Patient accepted . [facility] has been contacted/accepted.    Barriers to Discharge: Continued Medical Work up   Patient Goals and CMS Choice      Discharge Placement                         Discharge Plan and Services Additional resources added to the After Visit Summary for                                       Social Determinants of Health (SDOH) Interventions SDOH Screenings   Food Insecurity: No Food Insecurity (09/23/2022)  Housing: Low Risk  (09/23/2022)  Transportation Needs: No Transportation Needs (09/23/2022)  Recent Concern: Transportation Needs - Unmet Transportation Needs (09/07/2022)  Utilities: Not At Risk (09/23/2022)  Alcohol Screen: Low Risk  (12/31/2020)  Depression (PHQ2-9): Medium Risk (12/31/2020)  Financial Resource Strain: High Risk (12/31/2020)  Physical Activity: Insufficiently Active (12/31/2020)  Social Connections: Moderately Isolated (12/31/2020)  Stress: Stress Concern Present (12/31/2020)  Tobacco Use: High Risk (09/23/2022)     Readmission Risk Interventions     No data to display

## 2022-09-26 NOTE — Progress Notes (Signed)
Patient being discharged to home with husband. IV access removed and purewick removed. Patient is going to get dress but she will have to wait for a family member to come to hospital and get keys to house then obtain her home oxygen tank due to being on 4lpm oxygen. She stated it make take a while to get everything together.

## 2022-09-26 NOTE — Discharge Summary (Signed)
Physician Discharge Summary  [name] [id number] DOB: 1955/08/06 DOA: 09/23/2022  PCP: Yves Dill, NP  Admit date: 09/23/2022  Discharge date: 09/26/2022  Admitted From:Home  Disposition:  Home  Recommendations for Outpatient Follow-up:  Follow up with PCP in 1-2 weeks Follow-up with pulmonology Dr. Elsworth Soho scheduled 4/5 Continue on breathing treatments as prescribed with Xopenex preferred due to tachycardia issues Continue on Breztri with refills provided Continue magnesium supplementation and follow-up labs outpatient No further need for antibiotics as patient is continued 3-day course of antibiotics for E. coli UTI Hold further use of olmesartan/HCTZ as blood pressures have remained stable during the course of this hospitalization Requip and gabapentin doses have been cut in half due to polypharmacy, continue on weaning medications in outpatient setting per PCP Counseled on cessation of cocaine use Follow-up CT chest with contrast outpatient which will need to be rescheduled.  This was canceled during the course of this admission given her AKI.  Home Health: Yes with PT  Equipment/Devices: Has home 4 L nasal cannula oxygen  Discharge Condition:Stable  CODE STATUS: Full  Diet recommendation: Heart Healthy/carb modified  Brief/Interim Summary: [name] is a 67 y.o. female with medical history significant for severe COPD on 4 L O2, emphysema, LUL lung malignancy undergoing SBRT (last 3/19), tobacco use disorder, BPD1, CHF, HTN, and more brought to ED by EMS acutely altered and following fall at home 3/21 without LOC or head impact.  Patient was admitted with acute metabolic/toxic encephalopathy is multifactorial in the setting of sepsis with E. coli UTI as well as polypharmacy.  She was treated with Rocephin and completed 3-day course of treatment and was subsequently switched to Ancef and no longer needs antibiotics on discharge.  She was  also noted to have AKI which resolved with use of IV fluid.  She demonstrated persistent tachycardia with hypomagnesemia as well and some of this appears to be related to her ongoing breathing treatments.  I have recommended switch to [place] outpatient if patient cannot afford this and she may follow-up with her pulmonologist outpatient.  She will also be on magnesium supplementation outpatient as prescribed.  No other acute events or concerns noted and she was seen by physical therapy with recommendations for home health services.  2D echocardiogram performed on account of persistent tachycardia with no acute findings noted and results are as below.  Discharge Diagnoses:  Principal Problem:   Sepsis ([place]) Active Problems:   Hyperlipidemia   COPD GOLD 2/ still smoker    Tobacco use disorder   Bipolar 1 disorder ([place])   Anxiety   Polysubstance abuse ([place])  Principal discharge diagnosis: Acute metabolic encephalopathy multifactorial in the setting of AKI with polypharmacy as well as sepsis, present on admission, secondary to E. coli UTI.  Discharge Instructions  Discharge Instructions     Diet - low sodium heart healthy   Complete by: As directed    Increase activity slowly   Complete by: As directed       Allergies as of 09/26/2022       Reactions   Penicillins Other (See Comments)   Patient is unsure if she allergic to penicillin or septra. Patient states one or another caused "rib pain with a little breathing problem". Has patient had a PCN reaction causing immediate rash, facial/tongue/throat swelling, SOB or lightheadedness with hypotension: YES Has patient had a PCN reaction causing severe rash involving mucus membranes or skin necrosis: NO Has patient had a PCN reaction that required hospitalization:  NO Has patient had a PCN reaction occurring within the last 10 years: NO   Septra [sulfamethoxazole-trimethoprim] Other (See Comments)   Patient is unsure if she allergic to  septra or penicillin. Patient states one or another caused "rib pain with a little breathing problem".        Medication List     STOP taking these medications    albuterol (2.5 MG/3ML) 0.083% nebulizer solution Commonly known as: PROVENTIL   albuterol 108 (90 Base) MCG/ACT inhaler Commonly known as: VENTOLIN HFA   doxycycline 100 MG tablet Commonly known as: VIBRA-TABS   gabapentin 800 MG tablet Commonly known as: NEURONTIN Replaced by: gabapentin 400 MG capsule   ipratropium 0.02 % nebulizer solution Commonly known as: ATROVENT   olmesartan-hydrochlorothiazide 20-12.5 MG tablet Commonly known as: BENICAR HCT       TAKE these medications    Accu-Chek Guide test strip Generic drug: glucose blood Use as instructed to monitor glucose twice daily   Accu-Chek Softclix Lancets lancets Use as instructed to monitor glucose twice daily   acetaminophen 500 MG tablet Commonly known as: TYLENOL Take 1 tablet (500 mg total) by mouth every 6 (six) hours as needed. What changed: how much to take   Aspirin Low Dose 81 MG tablet Generic drug: aspirin EC Take 1 tablet (81 mg total) by mouth daily. Swallow whole.   Breztri Aerosphere 160-9-4.8 MCG/ACT Aero Generic drug: Budeson-Glycopyrrol-Formoterol Inhale 2 puffs into the lungs 2 (two) times daily.   cetirizine 10 MG tablet Commonly known as: ZYRTEC Take 10 mg by mouth daily as needed for allergies.   clopidogrel 75 MG tablet Commonly known as: PLAVIX Take 1 tablet (75 mg total) by mouth daily.   clotrimazole 10 MG troche Commonly known as: MYCELEX Take 1 tablet (10 mg total) by mouth 5 (five) times daily.   Combivent Respimat 20-100 MCG/ACT Aers respimat Generic drug: Ipratropium-Albuterol INHALE 1 PUFF INTO THE LUNGS EVERY (4) HOURS.   DULoxetine 20 MG capsule Commonly known as: CYMBALTA Take 20 mg by mouth daily.   gabapentin 400 MG capsule Commonly known as: NEURONTIN Take 1 capsule (400 mg total) by  mouth 3 (three) times daily. Replaces: gabapentin 800 MG tablet   guaiFENesin-dextromethorphan 100-10 MG/5ML syrup Commonly known as: ROBITUSSIN DM Take 5 mLs by mouth every 4 (four) hours as needed for cough.   HYDROmorphone 4 MG tablet Commonly known as: Dilaudid Take 1 tablet (4 mg total) by mouth every 6 (six) hours as needed for severe pain.   hydrOXYzine 25 MG capsule Commonly known as: VISTARIL Take 25 mg by mouth 3 (three) times daily as needed for anxiety.   ibuprofen 800 MG tablet Commonly known as: ADVIL Take 800 mg by mouth 3 (three) times daily.   levalbuterol 0.63 MG/3ML nebulizer solution Commonly known as: XOPENEX Take 3 mLs (0.63 mg total) by nebulization every 8 (eight) hours.   magnesium oxide 400 (240 Mg) MG tablet Commonly known as: MAG-OX Take 1 tablet (400 mg total) by mouth 2 (two) times daily.   meloxicam 15 MG tablet Commonly known as: MOBIC Take 15 mg by mouth daily.   metFORMIN 1000 MG tablet Commonly known as: GLUCOPHAGE Take 1 tablet (1,000 mg total) by mouth 2 (two) times daily.   nitroGLYCERIN 0.4 MG SL tablet Commonly known as: NITROSTAT Place under the tongue.   nystatin 100000 UNIT/ML suspension Commonly known as: MYCOSTATIN TAKE 5MLS BY MOUTH TWICE DAILY FOR 7 DAYS.   OLANZapine 5 MG tablet Commonly known as:  ZYPREXA Take 5 mg by mouth at bedtime.   omeprazole 20 MG capsule Commonly known as: PRILOSEC Take 20 mg by mouth daily.   ondansetron 4 MG disintegrating tablet Commonly known as: ZOFRAN-ODT Take 4 mg by mouth 2 (two) times daily as needed for nausea/vomiting.   pantoprazole 40 MG tablet Commonly known as: PROTONIX Take 40 mg by mouth daily.   rOPINIRole 1 MG tablet Commonly known as: REQUIP Take 1 tablet (1 mg total) by mouth 3 (three) times daily. What changed: how much to take   rosuvastatin 10 MG tablet Commonly known as: Crestor Take 1 tablet (10 mg total) by mouth daily.   SUMAtriptan 25 MG  tablet Commonly known as: IMITREX Take 25 mg by mouth every 2 (two) hours as needed for migraine.   thiamine 100 MG tablet Commonly known as: VITAMIN B1 Take 1 tablet (100 mg total) by mouth daily.   torsemide 20 MG tablet Commonly known as: DEMADEX Take 1 tablet (20 mg total) by mouth daily as needed. May take an additional 20 mg daily as needed for extreme swelling.        Follow-up Information     Nsumanganyi, Ferdinand Lango, NP. Schedule an appointment as soon as possible for a visit in 1 week(s).   Contact information: [place] [id number] [phone]                Allergies  Allergen Reactions   Penicillins Other (See Comments)    Patient is unsure if she allergic to penicillin or septra. Patient states one or another caused "rib pain with a little breathing problem". Has patient had a PCN reaction causing immediate rash, facial/tongue/throat swelling, SOB or lightheadedness with hypotension: YES Has patient had a PCN reaction causing severe rash involving mucus membranes or skin necrosis: NO Has patient had a PCN reaction that required hospitalization: NO Has patient had a PCN reaction occurring within the last 10 years: NO   Septra [Sulfamethoxazole-Trimethoprim] Other (See Comments)    Patient is unsure if she allergic to septra or penicillin. Patient states one or another caused "rib pain with a little breathing problem".    Consultations: None   Procedures/Studies: ECHOCARDIOGRAM COMPLETE  Result Date: 09/25/2022    ECHOCARDIOGRAM REPORT   Patient Name:   [name] Crable Date of Exam: 09/25/2022 Medical Rec #:  [id number]             Height:       64.0 in Accession #:    [id number]            Weight:       184.5 lb Date of Birth:  May 25, 1956            BSA:          1.891 m Patient Age:    67 years              BP:           150/51 mmHg Patient Gender: F                     HR:           97 bpm. Exam Location:  [facility] Procedure: 2D Echo, Cardiac Doppler and Color Doppler Indications:    R94.31 Abnormal EKG  History:        Patient has no prior history of Echocardiogram examinations.  Previous Myocardial Infarction, COPD, Signs/Symptoms:Altered                 Mental Status; Risk Factors:Current Smoker and Hypertension.                 ETOH. Lung cancer. Polysubstance abuse.  Sonographer:    Roseanna Rainbow RDCS Referring Phys: [id number] Royanne Foots [place]  1. Left ventricular ejection fraction, by estimation, is 65 to 70%. The left ventricle has normal function. The left ventricle has no regional wall motion abnormalities. Left ventricular diastolic parameters were normal.  2. Right ventricular systolic function is normal. The right ventricular size is normal. Tricuspid regurgitation signal is inadequate for assessing PA pressure.  3. The mitral valve is grossly normal. Trivial mitral valve regurgitation.  4. The aortic valve is tricuspid. Aortic valve regurgitation is not visualized.  5. The inferior vena cava is dilated in size with >50% respiratory variability, suggesting right atrial pressure of 8 mmHg. Comparison(s): Prior images reviewed side by side. LVEF vigorous at 65-70%. FINDINGS  Left Ventricle: Left ventricular ejection fraction, by estimation, is 65 to 70%. The left ventricle has normal function. The left ventricle has no regional wall motion abnormalities. The left ventricular internal cavity size was normal in size. There is  borderline left ventricular hypertrophy. Left ventricular diastolic parameters were normal. Right Ventricle: The right ventricular size is normal. No increase in right ventricular wall thickness. Right ventricular systolic function is normal. Tricuspid regurgitation signal is inadequate for assessing PA pressure. Left Atrium: Left atrial size was normal in size. Right Atrium: Right atrial size was normal in size. Pericardium: There is no evidence of pericardial effusion.  Presence of epicardial fat layer. Mitral Valve: The mitral valve is grossly normal. Trivial mitral valve regurgitation. Tricuspid Valve: The tricuspid valve is grossly normal. Tricuspid valve regurgitation is trivial. Aortic Valve: The aortic valve is tricuspid. There is mild aortic valve annular calcification. Aortic valve regurgitation is not visualized. Pulmonic Valve: The pulmonic valve was grossly normal. Pulmonic valve regurgitation is trivial. Aorta: The aortic root is normal in size and structure. Venous: The inferior vena cava is dilated in size with greater than 50% respiratory variability, suggesting right atrial pressure of 8 mmHg. IAS/Shunts: No atrial level shunt detected by color flow Doppler.  LEFT VENTRICLE PLAX 2D LVIDd:         4.50 cm     Diastology LVIDs:         2.50 cm     LV e' medial:    7.51 cm/s LV PW:         1.10 cm     LV E/e' medial:  13.0 LV IVS:        0.90 cm     LV e' lateral:   8.80 cm/s LVOT diam:     2.20 cm     LV E/e' lateral: 11.1 LV SV:         70 LV SV Index:   37 LVOT Area:     3.80 cm  LV Volumes (MOD) LV vol d, MOD A2C: 64.7 ml LV vol d, MOD A4C: 75.3 ml LV vol s, MOD A2C: 14.7 ml LV vol s, MOD A4C: 18.3 ml LV SV MOD A2C:     50.0 ml LV SV MOD A4C:     75.3 ml LV SV MOD BP:      52.2 ml RIGHT VENTRICLE             IVC RV S prime:  14.60 cm/s  IVC diam: 2.40 cm TAPSE (M-mode): 2.0 cm LEFT ATRIUM             Index        RIGHT ATRIUM           Index LA diam:        3.00 cm 1.59 cm/m   RA Area:     16.20 cm LA Vol (A2C):   35.5 ml 18.78 ml/m  RA Volume:   43.70 ml  23.12 ml/m LA Vol (A4C):   31.9 ml 16.87 ml/m LA Biplane Vol: 35.8 ml 18.94 ml/m  AORTIC VALVE LVOT Vmax:   115.00 cm/s LVOT Vmean:  73.800 cm/s LVOT VTI:    0.184 m  AORTA Ao Root diam: 3.00 cm Ao Asc diam:  3.00 cm MITRAL VALVE MV Area (PHT): 4.79 cm     SHUNTS MV Decel Time: 158 msec     Systemic VTI:  0.18 m MV E velocity: 97.87 cm/s   Systemic Diam: 2.20 cm MV A velocity: 103.17 cm/s MV E/A ratio:   0.95 Rozann Lesches MD Electronically signed by Rozann Lesches MD Signature Date/Time: 09/25/2022/2:21:59 PM    Final    DG Pelvis Portable  Result Date: 09/23/2022 CLINICAL DATA:  [place] in bathroom at 0400 hours today, history lung cancer EXAM: PORTABLE PELVIS 1-2 VIEWS COMPARISON:  Portable exam 1137 hours without priors for comparison FINDINGS: Osseous demineralization. SI joints preserved. Asymmetric narrowing of LEFT hip joint consistent with degenerative changes. RIGHT hip joint space preserved. No acute fracture, dislocation, or bone destruction. Scattered vascular calcifications. IMPRESSION: Degenerative changes LEFT hip joint with osseous demineralization. No acute abnormalities. Electronically Signed   By: Lavonia Dana M.D.   On: 09/23/2022 12:02   DG Chest Port 1 View  Result Date: 09/23/2022 CLINICAL DATA:  [place] in bathroom at 0400 hours today, no loss of consciousness, history lung cancer EXAM: PORTABLE CHEST 1 VIEW COMPARISON:  Portable exam 1140 hours compared to 01/12/2022 FINDINGS: Normal heart size, mediastinal contours, and pulmonary vascularity. Nodular focus lateral LEFT upper lobe again identified, 15 mm diameter, corresponding to known cavitary lesion on prior CT. Minimal biapical scarring. Lungs otherwise clear. No acute infiltrate, pleural effusion, or pneumothorax. Bones demineralized. IMPRESSION: LEFT upper lobe nodule consistent with known cavitary neoplasm. No acute abnormalities. Electronically Signed   By: Lavonia Dana M.D.   On: 09/23/2022 12:01   CT Head Wo Contrast  Result Date: 09/23/2022 CLINICAL DATA:  Fall EXAM: CT HEAD WITHOUT CONTRAST CT CERVICAL SPINE WITHOUT CONTRAST TECHNIQUE: Multidetector CT imaging of the head and cervical spine was performed following the standard protocol without intravenous contrast. Multiplanar CT image reconstructions of the cervical spine were also generated. RADIATION DOSE REDUCTION: This exam was performed according to the departmental  dose-optimization program which includes automated exposure control, adjustment of the mA and/or kV according to patient size and/or use of iterative reconstruction technique. COMPARISON:  CT facial bones 12/12/2016, cervical spine radiographs 10/05/2016 FINDINGS: CT HEAD FINDINGS Brain: There is no acute intracranial hemorrhage, extra-axial fluid collection, or acute infarct. Parenchymal volume is normal. The ventricles are normal in size. Gray-white differentiation is preserved. The pituitary and suprasellar region are normal. There is no mass lesion. There is no mass effect or midline shift. Vascular: There is calcification of the bilateral carotid siphons and vertebral arteries. Skull: Normal. Negative for fracture or focal lesion. Sinuses/Orbits: The paranasal sinuses are clear. Bilateral lens implants are in place. The globes and orbits are otherwise unremarkable. Other:  None. CT CERVICAL SPINE FINDINGS Alignment: There is a reversal of the normal cervical curvature. There is no jumped or perched facet or other evidence of traumatic malalignment. Skull base and vertebrae: Skull base alignment is maintained. Vertebral body heights are preserved. There is no evidence of acute fracture. There is no suspicious osseous lesion. Soft tissues and spinal canal: No prevertebral fluid or swelling. No visible canal hematoma. Disc levels: There is multilevel disc space narrowing and degenerative endplate change, most advanced at C5-C6 and C6-C7. There is multilevel facet arthropathy, most advanced at C2-C3 and C3-C4. There is ossification of the posterior longitudinal ligament at C5-C6 resulting in at least mild-to-moderate spinal canal stenosis. Upper chest: The previously seen solid lesion in the left apex on the CT from 07/13/2022 now demonstrates internal cavitation and is similar in appearance to the more remote prior CT from 03/22/2022. Other: None. IMPRESSION: 1. No acute intracranial pathology. 2. No acute fracture  or traumatic malalignment of the cervical spine. 3. The previously seen solid lesion in the left apex now demonstrates internal cavitation, similar in appearance to the more remote CT chest from 03/22/2022. Malignancy remains a possibility. Electronically Signed   By: Valetta Mole M.D.   On: 09/23/2022 11:52   CT Cervical Spine Wo Contrast  Result Date: 09/23/2022 CLINICAL DATA:  Fall EXAM: CT HEAD WITHOUT CONTRAST CT CERVICAL SPINE WITHOUT CONTRAST TECHNIQUE: Multidetector CT imaging of the head and cervical spine was performed following the standard protocol without intravenous contrast. Multiplanar CT image reconstructions of the cervical spine were also generated. RADIATION DOSE REDUCTION: This exam was performed according to the departmental dose-optimization program which includes automated exposure control, adjustment of the mA and/or kV according to patient size and/or use of iterative reconstruction technique. COMPARISON:  CT facial bones 12/12/2016, cervical spine radiographs 10/05/2016 FINDINGS: CT HEAD FINDINGS Brain: There is no acute intracranial hemorrhage, extra-axial fluid collection, or acute infarct. Parenchymal volume is normal. The ventricles are normal in size. Gray-white differentiation is preserved. The pituitary and suprasellar region are normal. There is no mass lesion. There is no mass effect or midline shift. Vascular: There is calcification of the bilateral carotid siphons and vertebral arteries. Skull: Normal. Negative for fracture or focal lesion. Sinuses/Orbits: The paranasal sinuses are clear. Bilateral lens implants are in place. The globes and orbits are otherwise unremarkable. Other: None. CT CERVICAL SPINE FINDINGS Alignment: There is a reversal of the normal cervical curvature. There is no jumped or perched facet or other evidence of traumatic malalignment. Skull base and vertebrae: Skull base alignment is maintained. Vertebral body heights are preserved. There is no  evidence of acute fracture. There is no suspicious osseous lesion. Soft tissues and spinal canal: No prevertebral fluid or swelling. No visible canal hematoma. Disc levels: There is multilevel disc space narrowing and degenerative endplate change, most advanced at C5-C6 and C6-C7. There is multilevel facet arthropathy, most advanced at C2-C3 and C3-C4. There is ossification of the posterior longitudinal ligament at C5-C6 resulting in at least mild-to-moderate spinal canal stenosis. Upper chest: The previously seen solid lesion in the left apex on the CT from 07/13/2022 now demonstrates internal cavitation and is similar in appearance to the more remote prior CT from 03/22/2022. Other: None. IMPRESSION: 1. No acute intracranial pathology. 2. No acute fracture or traumatic malalignment of the cervical spine. 3. The previously seen solid lesion in the left apex now demonstrates internal cavitation, similar in appearance to the more remote CT chest from 03/22/2022. Malignancy remains a possibility.  Electronically Signed   By: Valetta Mole M.D.   On: 09/23/2022 11:52     Discharge Exam: Vitals:   09/26/22 0600 09/26/22 0727  BP: 138/66   Pulse: 92 (!) 101  Resp: (!) 24 (!) 24  Temp:  98.2 F (36.8 C)  SpO2: 97% 100%   Vitals:   09/26/22 0500 09/26/22 0538 09/26/22 0600 09/26/22 0727  BP: (!) 170/79  138/66   Pulse: 95  92 (!) 101  Resp: (!) 29  (!) 24 (!) 24  Temp:    98.2 F (36.8 C)  TempSrc:    Oral  SpO2: 97% 98% 97% 100%  Weight: 83.7 kg     Height:        General: Pt is alert, awake, not in acute distress Cardiovascular: RRR, S1/S2 +, no rubs, no gallops Respiratory: CTA bilaterally, no wheezing, no rhonchi, 4 L nasal cannula Abdominal: Soft, NT, ND, bowel sounds + Extremities: no edema, no cyanosis    The results of significant diagnostics from this hospitalization (including imaging, microbiology, ancillary and laboratory) are listed below for reference.      Microbiology: Recent Results (from the past 240 hour(s))  Urine Culture     Status: Abnormal   Collection Time: 09/23/22  2:29 PM   Specimen: Urine, Clean Catch  Result Value Ref Range Status   Specimen Description   Final    URINE, CLEAN CATCH Performed at [facility], [address] [place], [place] [address]    Special Requests   Final    NONE Performed at [facility], [address], [place], [place] [address]    Culture >=100,000 COLONIES/mL ESCHERICHIA COLI (A)  Final   Report Status 09/25/2022 FINAL  Final   Organism ID, Bacteria ESCHERICHIA COLI (A)  Final      Susceptibility   Escherichia coli - MIC*    AMPICILLIN >=32 RESISTANT Resistant     CEFAZOLIN <=4 SENSITIVE Sensitive     CEFEPIME <=0.12 SENSITIVE Sensitive     CEFTRIAXONE <=0.25 SENSITIVE Sensitive     CIPROFLOXACIN <=0.25 SENSITIVE Sensitive     GENTAMICIN <=1 SENSITIVE Sensitive     IMIPENEM <=0.25 SENSITIVE Sensitive     NITROFURANTOIN <=16 SENSITIVE Sensitive     TRIMETH/SULFA >=320 RESISTANT Resistant     AMPICILLIN/SULBACTAM >=32 RESISTANT Resistant     PIP/TAZO <=4 SENSITIVE Sensitive     * >=100,000 COLONIES/mL ESCHERICHIA COLI  Culture, blood (Routine X 2) w Reflex to ID Panel     Status: None (Preliminary result)   Collection Time: 09/23/22  5:00 PM   Specimen: BLOOD LEFT HAND  Result Value Ref Range Status   Specimen Description BLOOD LEFT HAND  Final   Special Requests   Final    BOTTLES DRAWN AEROBIC AND ANAEROBIC Blood Culture adequate volume   Culture   Final    NO GROWTH 3 DAYS Performed at [facility], [address], [place], [place] [address]    Report Status PENDING  Incomplete  Culture, blood (Routine X 2) w Reflex to ID Panel     Status: None (Preliminary result)   Collection Time: 09/23/22  5:22 PM   Specimen: BLOOD  Result Value Ref Range Status   Specimen Description BLOOD BLOOD LEFT HAND  Final   Special Requests   Final    BOTTLES DRAWN AEROBIC ONLY Blood Culture  adequate volume   Culture   Final    NO GROWTH 3 DAYS Performed at [facility], [address]  [place], [place] [address]    Report Status PENDING  Incomplete  MRSA Next Gen by PCR, Nasal     Status: None   Collection Time: 09/23/22  9:12 PM   Specimen: Nasal Mucosa; Nasal Swab  Result Value Ref Range Status   MRSA by PCR Next Gen NOT DETECTED NOT DETECTED Final    Comment: (NOTE) The GeneXpert MRSA Assay (FDA approved for NASAL specimens only), is one component of a comprehensive MRSA colonization surveillance program. It is not intended to diagnose MRSA infection nor to guide or monitor treatment for MRSA infections. Test performance is not FDA approved in patients less than 63 years old. Performed at [facility], [address] [place],  [address]      Labs: BNP (last 3 results) Recent Labs    01/03/22 0913 01/05/22 0045  BNP 335.6* 0000000*   Basic Metabolic Panel: Recent Labs  Lab 09/23/22 1113 09/23/22 2324 09/24/22 0407 09/24/22 1238 09/25/22 0349 09/26/22 0331  NA 133*  --  138  --  139 137  K 4.0  --  2.9* 3.4* 3.6 4.1  CL 98  --  104  --  106 102  CO2 22  --  22  --  26 27  GLUCOSE 100*  --  85  --  128* 129*  BUN 19  --  11  --  7* 6*  CREATININE 1.46*  --  0.64  --  0.44 0.43*  CALCIUM 10.4*  --  10.1  --  10.2 10.6*  MG  --  0.8* 1.4* 1.3* 1.2* 1.2*   Liver Function Tests: Recent Labs  Lab 09/23/22 1113 09/24/22 0407  AST 134* 97*  ALT 59* 49*  ALKPHOS 222* 182*  BILITOT 0.6 1.0  PROT 6.7 5.9*  ALBUMIN 3.6 3.1*   No results for input(s): "LIPASE", "AMYLASE" in the last 168 hours. Recent Labs  Lab 09/23/22 1113  AMMONIA 25   CBC: Recent Labs  Lab 09/23/22 1113 09/24/22 0407 09/25/22 0349 09/26/22 0331  WBC 14.5* 10.6* 8.3 8.2  NEUTROABS 10.9*  --   --   --   HGB 11.7* 10.9* 9.5* 9.5*  HCT 35.0* 33.0* 29.0* 29.4*  MCV 93.3 93.2 93.9 97.4  PLT 315 332 279 294   Cardiac Enzymes: No results for input(s): "CKTOTAL",  "CKMB", "CKMBINDEX", "TROPONINI" in the last 168 hours. BNP: Invalid input(s): "POCBNP" CBG: Recent Labs  Lab 09/25/22 2005 09/26/22 0032 09/26/22 0054 09/26/22 0451 09/26/22 0727  GLUCAP 144* 114* 131* 123* 157*   D-Dimer No results for input(s): "DDIMER" in the last 72 hours. Hgb A1c Recent Labs    09/23/22 1113  HGBA1C 5.9*   Lipid Profile No results for input(s): "CHOL", "HDL", "LDLCALC", "TRIG", "CHOLHDL", "LDLDIRECT" in the last 72 hours. Thyroid function studies Recent Labs    09/23/22 1113  TSH 1.264   Anemia work up No results for input(s): "VITAMINB12", "FOLATE", "FERRITIN", "TIBC", "IRON", "RETICCTPCT" in the last 72 hours. Urinalysis    Component Value Date/Time   COLORURINE YELLOW 09/23/2022 1226   APPEARANCEUR HAZY (A) 09/23/2022 1226   LABSPEC 1.008 09/23/2022 1226   PHURINE 5.0 09/23/2022 1226   GLUCOSEU NEGATIVE 09/23/2022 1226   HGBUR MODERATE (A) 09/23/2022 1226   BILIRUBINUR NEGATIVE 09/23/2022 1226   BILIRUBINUR negative 08/01/2015 1122   KETONESUR NEGATIVE 09/23/2022 1226   PROTEINUR NEGATIVE 09/23/2022 1226   UROBILINOGEN negative 08/01/2015 1122   UROBILINOGEN 0.2 09/17/2014 2143   NITRITE POSITIVE (A) 09/23/2022 1226   LEUKOCYTESUR LARGE (  A) 09/23/2022 1226   Sepsis Labs Recent Labs  Lab 09/23/22 1113 09/24/22 0407 09/25/22 0349 09/26/22 0331  WBC 14.5* 10.6* 8.3 8.2   Microbiology Recent Results (from the past 240 hour(s))  Urine Culture     Status: Abnormal   Collection Time: 09/23/22  2:29 PM   Specimen: Urine, Clean Catch  Result Value Ref Range Status   Specimen Description   Final    URINE, CLEAN CATCH Performed at [facility], [address] [place], [place] [address]    Special Requests   Final    NONE Performed at [facility], [address], [place], [place] [address]    Culture >=100,000 COLONIES/mL ESCHERICHIA COLI (A)  Final   Report Status 09/25/2022 FINAL  Final   Organism ID, Bacteria ESCHERICHIA COLI  (A)  Final      Susceptibility   Escherichia coli - MIC*    AMPICILLIN >=32 RESISTANT Resistant     CEFAZOLIN <=4 SENSITIVE Sensitive     CEFEPIME <=0.12 SENSITIVE Sensitive     CEFTRIAXONE <=0.25 SENSITIVE Sensitive     CIPROFLOXACIN <=0.25 SENSITIVE Sensitive     GENTAMICIN <=1 SENSITIVE Sensitive     IMIPENEM <=0.25 SENSITIVE Sensitive     NITROFURANTOIN <=16 SENSITIVE Sensitive     TRIMETH/SULFA >=320 RESISTANT Resistant     AMPICILLIN/SULBACTAM >=32 RESISTANT Resistant     PIP/TAZO <=4 SENSITIVE Sensitive     * >=100,000 COLONIES/mL ESCHERICHIA COLI  Culture, blood (Routine X 2) w Reflex to ID Panel     Status: None (Preliminary result)   Collection Time: 09/23/22  5:00 PM   Specimen: BLOOD LEFT HAND  Result Value Ref Range Status   Specimen Description BLOOD LEFT HAND  Final   Special Requests   Final    BOTTLES DRAWN AEROBIC AND ANAEROBIC Blood Culture adequate volume   Culture   Final    NO GROWTH 3 DAYS Performed at [facility], [address] [place], [place] [address]    Report Status PENDING  Incomplete  Culture, blood (Routine X 2) w Reflex to ID Panel     Status: None (Preliminary result)   Collection Time: 09/23/22  5:22 PM   Specimen: BLOOD  Result Value Ref Range Status   Specimen Description BLOOD BLOOD LEFT HAND  Final   Special Requests   Final    BOTTLES DRAWN AEROBIC ONLY Blood Culture adequate volume   Culture   Final    NO GROWTH 3 DAYS Performed at [facility], [address], [place], [place] [address]    Report Status PENDING  Incomplete  MRSA Next Gen by PCR, Nasal     Status: None   Collection Time: 09/23/22  9:12 PM   Specimen: Nasal Mucosa; Nasal Swab  Result Value Ref Range Status   MRSA by PCR Next Gen NOT DETECTED NOT DETECTED Final    Comment: (NOTE) The GeneXpert MRSA Assay (FDA approved for NASAL specimens only), is one component of a comprehensive MRSA colonization surveillance program. It is not intended to diagnose MRSA infection  nor to guide or monitor treatment for MRSA infections. Test performance is not FDA approved in patients less than 22 years old. Performed at [facility], [address], [place],  [address]      Time coordinating discharge: 35 minutes  SIGNED:   Rodena Goldmann, DO [facility] 09/26/2022, 9:28 AM  If 7PM-7AM, please contact night-coverage [email or web address]

## 2022-09-27 ENCOUNTER — Ambulatory Visit: Payer: 59 | Admitting: Orthopedic Surgery

## 2022-09-27 ENCOUNTER — Other Ambulatory Visit: Payer: Self-pay | Admitting: Pulmonary Disease

## 2022-09-28 LAB — CULTURE, BLOOD (ROUTINE X 2)
Culture: NO GROWTH
Culture: NO GROWTH
Special Requests: ADEQUATE
Special Requests: ADEQUATE

## 2022-09-29 ENCOUNTER — Telehealth: Payer: Self-pay | Admitting: *Deleted

## 2022-09-29 MED ORDER — COMBIVENT RESPIMAT 20-100 MCG/ACT IN AERS: INHALATION_SPRAY | RESPIRATORY_TRACT | 5 refills | Status: DC

## 2022-09-29 NOTE — Telephone Encounter (Signed)
Patient states [facility] has not received an order for Respimat inhaler and patient is out. Please call when order is placed to patient [phone]

## 2022-09-29 NOTE — Telephone Encounter (Signed)
Called and spoke to patient.  Refill sent.  Nothing further needed.

## 2022-10-05 ENCOUNTER — Telehealth: Payer: Self-pay | Admitting: Pulmonary Disease

## 2022-10-05 DIAGNOSIS — J441 Chronic obstructive pulmonary disease with (acute) exacerbation: Secondary | ICD-10-CM

## 2022-10-05 MED ORDER — AZITHROMYCIN 250 MG PO TABS: ORAL_TABLET | ORAL | 0 refills | Status: AC

## 2022-10-05 NOTE — Telephone Encounter (Signed)
ATC to notify. Zpak sent to [facility]

## 2022-10-05 NOTE — Telephone Encounter (Signed)
Patient has had a cough with green mucus the last couple of days and wants something called in to [place] for it. Was confirmed by front staff that she will be at appt on 10/08/22.  ATC patient for more information, was unable to reach.   Dr. Elsworth Soho please advise.   LOV assessment/ plan 07/26/2022 Assessment & Plan:         Assessment & Plan Note by Rigoberto Noel, MD at 07/26/2022 11:12 AM  Author: Rigoberto Noel, MD Author Type: Physician Filed: 07/26/2022 11:12 AM  Note Status: Written Cosign: Cosign Not Required Encounter Date: 07/26/2022  Problem: Chronic hypoxemic respiratory failure ([place])  Editor: Rigoberto Noel, MD (Physician)             Continue 4 L of oxygen at all times        Assessment & Plan Note by Rigoberto Noel, MD at 07/26/2022 11:10 AM  Author: Rigoberto Noel, MD Author Type: Physician Filed: 07/26/2022 11:11 AM  Note Status: [place] List: Cosign Not Required Encounter Date: 07/26/2022  Problem: Lung mass  Editor: Rigoberto Noel, MD (Physician)      Prior Versions: 1. Rigoberto Noel, MD (Physician) at 07/26/2022 11:10 AM - Written  Left upper lobe lung mass was cavitary and now appears more solid, size is unchanged.  This is likely to be malignancy.  This was not present in 2019. I explained to the patient and her husband that with hypermetabolism on PET scan and its appearance, this is very likely to be malignancy.  We discussed risks of biopsy including that of lung puncture requiring chest tube, nonresolving bronchopleural fistula and chance of respiratory failure requiring mechanical ventilation and even death.  She would like to think about this some more but very likely does not want to go through this procedure. I discussed options of taking a wait and watch approach versus empiric radiation therapy.  She is certainly not a candidate for surgery and without a biopsy she cannot be put through chemotherapy. We made an office visit to continue this  conversation in 1 month.  We will refer her to radiation oncology in the interim Will plan to repeat CT chest with contrast in 3 months          Assessment & Plan Note by Rigoberto Noel, MD at 07/26/2022 11:08 AM  Author: Rigoberto Noel, MD Author Type: Physician Filed: 07/26/2022 11:08 AM  Note Status: Written Cosign: Cosign Not Required Encounter Date: 07/26/2022  Problem: COPD with acute exacerbation [facility])  Editor: Rigoberto Noel, MD (Physician)             She is compliant with Judithann Sauger and use albuterol for rescue. She continues to have persistent bronchospasm. Will give Solu-Medrol 80 mg IM today use a longer prednisone course starting at 40 mg for 2 weeks. Will consider adding Roflumilast to decrease exacerbations, she does have history of depression which complicates        Patient Instructions by Rigoberto Noel, MD at 07/26/2022 10:15 AM  Author: Rigoberto Noel, MD Author Type: Physician Filed: 07/26/2022 10:10 AM  Note Status: Signed Cosign: Cosign Not Required Encounter Date: 07/26/2022  Editor: Rigoberto Noel, MD (Physician)               X solumedrol 80 mg IM x 1   X Prednisone 10 mg tabs Take 4 tabs  daily with food x 4 days, then 3 tabs daily x 4  days, then 2 tabs daily x 4 days, then 1 tab daily x4 days then stop. #40   Continue on Breztri - twice daily Use albuterol nebs as needed every 6h   We discussed biopsy & [id number] risk of complication Other option is wait & watch OR go through radiation therapy   X refer to radiation oncology   X Ct chest w con in 3 months

## 2022-10-05 NOTE — Telephone Encounter (Signed)
Patient states she has been having a lot of green mucus the last couple of days and would like something called into [facility]. Confirmed that patient will be at her appointment on Friday 4/5.

## 2022-10-06 ENCOUNTER — Other Ambulatory Visit: Payer: Self-pay | Admitting: Pulmonary Disease

## 2022-10-08 ENCOUNTER — Encounter: Payer: Self-pay | Admitting: Pulmonary Disease

## 2022-10-08 ENCOUNTER — Ambulatory Visit (INDEPENDENT_AMBULATORY_CARE_PROVIDER_SITE_OTHER): Payer: 59 | Admitting: Pulmonary Disease

## 2022-10-08 VITALS — BP 147/70 | HR 86 | Ht 64.0 in | Wt 189.6 lb

## 2022-10-08 DIAGNOSIS — F17210 Nicotine dependence, cigarettes, uncomplicated: Secondary | ICD-10-CM | POA: Diagnosis not present

## 2022-10-08 DIAGNOSIS — J9611 Chronic respiratory failure with hypoxia: Secondary | ICD-10-CM | POA: Diagnosis not present

## 2022-10-08 DIAGNOSIS — C3412 Malignant neoplasm of upper lobe, left bronchus or lung: Secondary | ICD-10-CM | POA: Diagnosis not present

## 2022-10-08 DIAGNOSIS — J449 Chronic obstructive pulmonary disease, unspecified: Secondary | ICD-10-CM

## 2022-10-08 NOTE — Assessment & Plan Note (Signed)
Status post empiric SBRT. Considered too high risk for biopsy. She is due for a follow-up surveillance CT scan in April

## 2022-10-08 NOTE — Assessment & Plan Note (Signed)
Emphasized smoking cessation.  

## 2022-10-08 NOTE — Assessment & Plan Note (Signed)
Continue oxygen @ 4 L/min ?

## 2022-10-08 NOTE — Assessment & Plan Note (Signed)
Continue Breztri. Mouth dryness is due to anticholinergic she can discontinue Combivent since she already has anticholinergic and Breztri.  I do not believe that albuterol is causing hypomagnesemia.  More likely this was related to Lasix and PPI She has been changed from albuterol to Xopenex nebs after hospitalization we will continue for now since patient is already guarded but can switch back to albuterol in the future

## 2022-10-08 NOTE — Patient Instructions (Addendum)
rinse mouth after breztri  Use albuterol MDI instead of combivent - for dryness  Await CT scan  Decrease requip to evening dose only

## 2022-10-08 NOTE — Progress Notes (Signed)
   Subjective:    Patient ID: [name], female    DOB: [birth year], 67 y.o.   MRN: [id number]  HPI  67 yo  smoker for FU severe COPD , tracheobronchomalacia, chronic hypoxic respiratory failure with baseline oxygen at 4 to 5 L/min.   Has been on oxygen since 2019 PMH - bipolar disorder and polysubstance abuse    She presented with cavitary lung mass in the left upper lobe measuring 2.7 cm, hypermetabolic on PET scan suspicious for underlying malignancy.  Due to severe COPD with significant oxygen demands at 4 to 5 L/min, she was considered high risk for biopsy and underwent  empiric SBRT which she completed 09/21/2022  Chief Complaint  Patient presents with   Follow-up    PT f/u states that she is doing fair, no questions or concerns   07/2022 office visit bronchospasm treated with prednisone for 2 weeks and refer to radiation oncology She had hospital admission for altered mental status  and  fall at home 3/21 without LOC or head impact.  She had just received Dilaudid from rad onc and was already on multiple sedating medicines. patient was admitted with acute metabolic/toxic encephalopathy is multifactorial in the setting of sepsis with E. coli UTI as well as polypharmacy.  She was treated with Rocephin  AKI -resolved with fluids She had persistent tachycardia with hypomagnesemia and this was felt to be related to albuterol, she was switched to Xopenex and Combivent on discharge  She has lost weight from 201 to 189 pounds.  Accompanied by her husband today She reports breathing is good, oxygen saturation 90% only uses POC as needed  Chest x-ray 3/21 was reviewed which showed left upper lobe nodule otherwise clear  Significant tests/ events reviewed] PFTs 1/ 2022 FEV1 50%, ratio 77, FVC 49%, no significant bronchodilator response, DLCO 50%     12/2020 bronchoscopy at [facility] [[email or web address]] showed tracheobronchomalacia   07/2022 CT chest cavitary portion has now filled up left upper  lobe lesion is 2.4 x 1.6 cm in size, mostly unchanged from 2.2 x 1.9 cm in September   03/2022 CT chest showed a cavitary solid 2.7 cm apical left upper lobe pulmonary nodule increased in size.  Suspicious for underlying carcinoma.   Previous CT in 2019 showed airspace disease   PET scan 04/2022 enlarging cavitary lesion in left apex with mild hypermetabolic activity suspicious for adenocarcinoma.  Small hypermetabolic nodule in the left parotid lobe.  Review of Systems neg for any significant sore throat, dysphagia, itching, sneezing, nasal congestion or excess/ purulent secretions, fever, chills, sweats, unintended wt loss, pleuritic or exertional cp, hempoptysis, orthopnea pnd or change in chronic leg swelling. Also denies presyncope, palpitations, heartburn, abdominal pain, nausea, vomiting, diarrhea or change in bowel or urinary habits, dysuria,hematuria, rash, arthralgias, visual complaints, headache, numbness weakness or ataxia.     Objective:   Physical Exam  Gen. Pleasant, obese, in no distress ENT - no lesions, no post nasal drip Neck: No JVD, no thyromegaly, no carotid bruits Lungs: no use of accessory muscles, no dullness to percussion, decreased without rales or rhonchi  Cardiovascular: Rhythm regular, heart sounds  normal, no murmurs or gallops, no peripheral edema Musculoskeletal: No deformities, no cyanosis or clubbing , no tremors       Assessment & Plan:

## 2022-10-19 ENCOUNTER — Telehealth: Payer: Self-pay | Admitting: *Deleted

## 2022-10-19 NOTE — Telephone Encounter (Signed)
ATC pt LVM instructing her to call back

## 2022-10-19 NOTE — Telephone Encounter (Signed)
Patient called and states that she has been having symptoms of SOB, coughing, and wheezing for the last couple of weeks and would like to see if Dr. Vassie Loll could send over an antibiotic or another recommendation. Patient uses [facility] and would like to receive a call back   Please call and advise patient  [phone]

## 2022-10-20 NOTE — Telephone Encounter (Signed)
Called and spoke w/ pt she verbalized that she isn't sick but is having increasing anxiety. Pt seen PCP today (4/16) and mentioned the anxiety issues w/ provider but it was not addressed.   Dr.Alva, just sending to you as an FYI.

## 2022-10-25 ENCOUNTER — Ambulatory Visit (HOSPITAL_COMMUNITY): Payer: 59

## 2022-11-01 ENCOUNTER — Telehealth: Payer: Self-pay | Admitting: *Deleted

## 2022-11-01 DIAGNOSIS — J441 Chronic obstructive pulmonary disease with (acute) exacerbation: Secondary | ICD-10-CM

## 2022-11-01 NOTE — Telephone Encounter (Signed)
Patient called and states she is having some coughing, wheezing, yellow and green mucus and would like to see if she can get an antibiotics or a steroid sent over.  Patient uses [facility] and would like a call back if a prescription is sent over. [phone]

## 2022-11-03 NOTE — Telephone Encounter (Signed)
I called the pt x 2 and each time msg states call can not be completed. Will try back later.

## 2022-11-05 MED ORDER — PREDNISONE 20 MG PO TABS: 40.0000 mg | ORAL_TABLET | Freq: Every day | ORAL | 0 refills | Status: DC

## 2022-11-05 NOTE — Telephone Encounter (Signed)
Charlott Holler, MD  to [facility] Triage [facility]      11/05/22 11:52 AM  Prednisone sent to pharmacy   I have called the pt and there was no answer- line rings multiple times and then busy signal.

## 2022-11-05 NOTE — Telephone Encounter (Signed)
Pt called the office about her calling the office prior. States that she has been wheezing and is also coughing up phlegm that is yellow-green in color. Pt also has tightness in chest.  States symptoms have been going on for about 1 week now.  Pt denies any complaints of fever.  Has been taking robitussin as well as mucinex which neither have helped. Pt is using her Breztri inhaler as prescribed. States she has had to use her albuterol inhaler at least 4 times a day. Pt is requesting to have meds sent to the pharmacy.  Pharmacy meds need to be sent to is [name].  Sending to provider of the day with Dr. Vassie Loll not showing available in [place]. Dr. Celine Mans, please advise.

## 2022-11-08 ENCOUNTER — Ambulatory Visit
Admission: RE | Admit: 2022-11-08 | Discharge: 2022-11-08 | Disposition: A | Discharge status: Home / Self Care | Payer: 59 | Source: Ambulatory Visit | Attending: Radiation Oncology | Admitting: Radiation Oncology

## 2022-11-08 NOTE — Progress Notes (Signed)
  Radiation Oncology         [phone] ________________________________  Name: [name] MRN: [id number]  Date of Service: 11/08/2022  DOB: 1955/10/19  Post Treatment Telephone Note  Diagnosis:  Putative Stage IA3, cT1cN0M0, NSCLC of the LUL    Indication for treatment:  Curative        Radiation treatment dates:      09/07/2022 through 09/21/2022 Site Technique Total Dose (Gy) Dose per Fx (Gy) Completed Fx Beam Energies  Lung, Left: Lung_L_LUL IMRT 60/60 12 5/5 6XFFF   (as documented in provider EOT note)  The patient was available for call today.   Symptoms of fatigue have improved since completing therapy.  Symptoms of skin changes have improved since completing therapy.  Symptoms of esophagitis have improved since completing therapy.  The patient has scheduled follow up with Dr. Vassie Loll in pulmonary for ongoing care, and was encouraged to call if she develops concerns or questions regarding radiation.  This concludes the interview.   Ruel Favors, LPN

## 2022-11-08 NOTE — Telephone Encounter (Signed)
Patient is aware of below message and voiced her understanding.  She stated that she was not notified by pharmacy that Rx was ready. She will reach out to pharmacy. Nothing further needed.

## 2022-11-11 ENCOUNTER — Ambulatory Visit (HOSPITAL_COMMUNITY): Payer: 59

## 2022-11-15 ENCOUNTER — Telehealth: Payer: Self-pay | Admitting: Pulmonary Disease

## 2022-11-15 NOTE — Telephone Encounter (Signed)
[facility] closed so sending to [facility]. Pt states we called in Pred for her but she wonders if we can call in an Antibx due to the yellow color of her phlegm.  Pls call @ [phone]  PHARM: [facility]

## 2022-11-15 NOTE — Telephone Encounter (Signed)
Called and spoke w/ pt offered her an appt w/ [facility] on 5/16 @ 9:30 because she has been sick ongoing >1 month w/ no relief. Se verbalized that she is currently using 4L O2 and is still having SOB.   Routing to RA as an FYI, that she is scheduled for acute OV.

## 2022-11-18 ENCOUNTER — Ambulatory Visit: Payer: 59 | Admitting: Nurse Practitioner

## 2022-11-23 ENCOUNTER — Ambulatory Visit (INDEPENDENT_AMBULATORY_CARE_PROVIDER_SITE_OTHER): Payer: 59 | Admitting: General Surgery

## 2022-11-23 ENCOUNTER — Encounter: Payer: Self-pay | Admitting: General Surgery

## 2022-11-23 VITALS — BP 135/70 | HR 106 | Temp 98.8°F | Resp 18 | Ht 64.0 in | Wt 177.0 lb

## 2022-11-23 DIAGNOSIS — M6208 Separation of muscle (nontraumatic), other site: Secondary | ICD-10-CM | POA: Diagnosis not present

## 2022-11-24 NOTE — Progress Notes (Signed)
[name]; [id number]; 09/29/1955   HPI Patient is a 67 year old white female who referred herself to me for evaluation and treatment of upper abdominal swelling.  She thinks she has a hernia.  It is occasionally sore to the left of the midline above the umbilicus.  Is made worse with straining.  She was recently diagnosed with left lung cancer and is undergoing radiation therapy.  She is on home oxygen.  She has had a cholecystectomy in the remote past. Past Medical History:  Diagnosis Date   Anemia    Anxiety    Arthritis    Bipolar 1 disorder (HCC)    CHF (congestive heart failure) (HCC)    COPD (chronic obstructive pulmonary disease) (HCC)    Depression    Diabetes mellitus without complication (HCC)    Dyspnea    Emphysema of lung (HCC)    GERD (gastroesophageal reflux disease)    Headache    migraines   History of hiatal hernia    History of kidney stones    Hyperlipidemia    Hypertension    Neuromuscular disorder (HCC)    neuropathy   On home oxygen therapy    Pneumonia 2015, 2019   Tracheomalacia    Vaginal Pap smear, abnormal     Past Surgical History:  Procedure Laterality Date   CATARACT EXTRACTION W/PHACO Left 01/14/2020   Procedure: CATARACT EXTRACTION PHACO AND INTRAOCULAR LENS PLACEMENT LEFT EYE;  Surgeon: Fabio Pierce, MD;  Location: [facility];  Service: Ophthalmology;  Laterality: Left;  CDE: 8.18   CATARACT EXTRACTION W/PHACO Right 02/01/2020   Procedure: CATARACT EXTRACTION PHACO AND INTRAOCULAR LENS PLACEMENT RIGHT EYE;  Surgeon: Fabio Pierce, MD;  Location: [facility];  Service: Ophthalmology;  Laterality: Right;  CDE: 9.94   CHEILECTOMY Right 01/28/2020   Procedure: KELLER BUNION IMPLANT RIGHT FOOT CHEILECTOMY RIGHT ROOT;  Surgeon: Asencion Islam, DPM;  Location: [facility];  Service: Podiatry;  Laterality: Right;  BLOCK   CHOLECYSTECTOMY     COLONOSCOPY  July 2010   Dr. Lovell Sheehan: 3 rectal polyps, not enough tissue for pathologic examination,  recommended surveillance in 3 years   COLONOSCOPY N/A 10/23/2014   RMR: Multiple colonic polyps removed as described above. No endoscopic explaniation for abdominal pain. however. next tcs 10/2019   ESOPHAGOGASTRODUODENOSCOPY  July 2010   Dr. Lovell Sheehan: gastritis and duodenitis, H.pylori negative   ESOPHAGOGASTRODUODENOSCOPY N/A 10/23/2014   RMR: Normal EGD. Status post passage of a Maloney dilator. Today's finding s would not explain abdominal pain   EYE SURGERY Left 01/14/2020   cataract removal   GANGLION CYST EXCISION Left 09/07/2018   Procedure: REMOVAL GANGLION OF WRIST;  Surgeon: Vickki Hearing, MD;  Location: [facility];  Service: Orthopedics;  Laterality: Left;   HERNIA REPAIR     Dr. Lovell Sheehan   KIDNEY STONE SURGERY     MALONEY DILATION N/A 10/23/2014   Procedure: Elease Hashimoto DILATION;  Surgeon: Corbin Ade, MD;  Location: [facility];  Service: Endoscopy;  Laterality: N/A;   OPEN REDUCTION INTERNAL FIXATION (ORIF) DISTAL RADIAL FRACTURE Right 12/09/2015   Procedure: OPEN REDUCTION INTERNAL FIXATION (ORIF) RIGHT DISTAL RADIUS;  Surgeon: Betha Loa, MD;  Location: [facility];  Service: Orthopedics;  Laterality: Right;    Family History  Adopted: Yes    Current Outpatient Medications on File Prior to Visit  Medication Sig Dispense Refill   Accu-Chek Softclix Lancets lancets Use as instructed to monitor glucose twice daily 100 each 12   acetaminophen (TYLENOL) 500  MG tablet Take 1 tablet (500 mg total) by mouth every 6 (six) hours as needed. (Patient taking differently: Take 1,000 mg by mouth every 6 (six) hours as needed.) 30 tablet 0   aspirin EC 81 MG tablet Take 1 tablet (81 mg total) by mouth daily. Swallow whole. 30 tablet 2   Budeson-Glycopyrrol-Formoterol (BREZTRI AEROSPHERE) 160-9-4.8 MCG/ACT AERO Inhale 2 puffs into the lungs 2 (two) times daily. 10.7 g 1   cetirizine (ZYRTEC) 10 MG tablet Take 10 mg by mouth daily as needed for allergies.      clopidogrel  (PLAVIX) 75 MG tablet Take 1 tablet (75 mg total) by mouth daily. 30 tablet 2   clotrimazole (MYCELEX) 10 MG troche Take 1 tablet (10 mg total) by mouth 5 (five) times daily. 35 Troche 0   DULoxetine (CYMBALTA) 20 MG capsule Take 20 mg by mouth daily.     gabapentin (NEURONTIN) 400 MG capsule Take 1 capsule (400 mg total) by mouth 3 (three) times daily. 90 capsule 0   glucose blood (ACCU-CHEK GUIDE) test strip Use as instructed to monitor glucose twice daily 100 each 12   guaiFENesin-dextromethorphan (ROBITUSSIN DM) 100-10 MG/5ML syrup Take 5 mLs by mouth every 4 (four) hours as needed for cough. 118 mL 0   hydrOXYzine (VISTARIL) 25 MG capsule Take 25 mg by mouth 3 (three) times daily as needed for anxiety.     levalbuterol (XOPENEX) 0.63 MG/3ML nebulizer solution Take 3 mLs (0.63 mg total) by nebulization every 8 (eight) hours. 3 mL 12   meloxicam (MOBIC) 15 MG tablet Take 15 mg by mouth daily.     metFORMIN (GLUCOPHAGE) 1000 MG tablet Take 1 tablet (1,000 mg total) by mouth 2 (two) times daily. 180 tablet 3   nitroGLYCERIN (NITROSTAT) 0.4 MG SL tablet Place under the tongue.     nystatin (MYCOSTATIN) 100000 UNIT/ML suspension TAKE BY MOUTH TWICE DAILY FOR 7 DAYS. 70 mL 0   OLANZapine (ZYPREXA) 5 MG tablet Take 5 mg by mouth at bedtime.     omeprazole (PRILOSEC) 20 MG capsule Take 20 mg by mouth daily.     ondansetron (ZOFRAN-ODT) 4 MG disintegrating tablet Take 4 mg by mouth 2 (two) times daily as needed for nausea/vomiting.     pantoprazole (PROTONIX) 40 MG tablet Take 40 mg by mouth daily.     predniSONE (DELTASONE) 20 MG tablet Take 2 tablets (40 mg total) by mouth daily with breakfast. 10 tablet 0   simvastatin (ZOCOR) 20 MG tablet Take 20 mg by mouth daily at 6 PM.     SUMAtriptan (IMITREX) 25 MG tablet Take 25 mg by mouth every 2 (two) hours as needed for migraine.     thiamine 100 MG tablet Take 1 tablet (100 mg total) by mouth daily. 30 tablet 1   torsemide (DEMADEX) 20 MG tablet  Take 1 tablet (20 mg total) by mouth daily as needed. May take an additional 20 mg daily as needed for extreme swelling. 30 tablet 0   rOPINIRole (REQUIP) 1 MG tablet Take 1 tablet (1 mg total) by mouth 3 (three) times daily. 90 tablet 0   No current facility-administered medications on file prior to visit.    Allergies  Allergen Reactions   Penicillins Other (See Comments)    Patient is unsure if she allergic to penicillin or septra. Patient states one or another caused "rib pain with a little breathing problem". Has patient had a PCN reaction causing immediate rash, facial/tongue/throat swelling, SOB or lightheadedness  with hypotension: YES Has patient had a PCN reaction causing severe rash involving mucus membranes or skin necrosis: NO Has patient had a PCN reaction that required hospitalization: NO Has patient had a PCN reaction occurring within the last 10 years: NO   Septra [Sulfamethoxazole-Trimethoprim] Other (See Comments)    Patient is unsure if she allergic to septra or penicillin. Patient states one or another caused "rib pain with a little breathing problem".    Social History   Substance and Sexual Activity  Alcohol Use Yes   Alcohol/week: 1.0 standard drink of alcohol   Types: 1 Standard drinks or equivalent per week   Comment: alcohol abuse    Social History   Tobacco Use  Smoking Status Every Day   Packs/day: 2.00   Years: 52.00   Additional pack years: 0.00   Total pack years: 104.00   Types: Cigarettes   Start date: 1969  Smokeless Tobacco Never  Tobacco Comments   Smoking 1/2 a pack in 24 hours.  05/31/2022 hfb    Review of Systems  Constitutional: Negative.   HENT:  Positive for sinus pain.   Eyes: Negative.   Respiratory:  Positive for shortness of breath and wheezing.   Cardiovascular: Negative.   Gastrointestinal:  Positive for abdominal pain, heartburn and nausea.  Genitourinary: Negative.   Musculoskeletal:  Positive for back pain.  Skin:  Negative.   Neurological: Negative.   Endo/Heme/Allergies: Negative.   Psychiatric/Behavioral: Negative.      Objective   Vitals:   11/23/22 1330  BP: 135/70  Pulse: (!) 106  Resp: 18  Temp: 98.8 F (37.1 C)  SpO2: 93%    Physical Exam Vitals reviewed.  Constitutional:      Appearance: Normal appearance. She is not ill-appearing.     Comments: Frail-appearing, wearing oxygen nasal cannula  HENT:     Head: Normocephalic and atraumatic.  Cardiovascular:     Rate and Rhythm: Normal rate and regular rhythm.     Heart sounds: Normal heart sounds. No murmur heard.    No friction rub. No gallop.  Pulmonary:     Effort: Pulmonary effort is normal. No respiratory distress.     Breath sounds: No stridor. Wheezing and rales present. No rhonchi.  Abdominal:     General: Bowel sounds are normal. There is no distension.     Palpations: Abdomen is soft. There is no mass.     Tenderness: There is no abdominal tenderness. There is no guarding or rebound.     Hernia: No hernia is present.     Comments: Patient has a diastases recti.  I surgical scars noted in the epigastric region.  I could not appreciate a distinct ventral wall hernia.  Skin:    General: Skin is warm and dry.  Neurological:     Mental Status: She is alert and oriented to person, place, and time.     Assessment  Diastases recti.  Could not rule out occult ventral hernia, though she is more concerned about the diastases recti.  She is currently undergoing radiation therapy for left lung carcinoma.  She does have COPD with home oxygen requirements. Plan  No need for surgical intervention at the present time.  I did give the patient literature about what a diastases recti is.  All questions were answered.  Follow-up here as needed.

## 2022-11-26 ENCOUNTER — Ambulatory Visit (INDEPENDENT_AMBULATORY_CARE_PROVIDER_SITE_OTHER): Payer: 59 | Admitting: Podiatry

## 2022-11-26 ENCOUNTER — Other Ambulatory Visit: Payer: Self-pay | Admitting: Internal Medicine

## 2022-11-26 ENCOUNTER — Encounter: Payer: Self-pay | Admitting: Pulmonary Disease

## 2022-11-26 ENCOUNTER — Encounter: Payer: Self-pay | Admitting: Podiatry

## 2022-11-26 DIAGNOSIS — B351 Tinea unguium: Secondary | ICD-10-CM | POA: Diagnosis not present

## 2022-11-26 DIAGNOSIS — M79674 Pain in right toe(s): Secondary | ICD-10-CM | POA: Diagnosis not present

## 2022-11-26 DIAGNOSIS — E119 Type 2 diabetes mellitus without complications: Secondary | ICD-10-CM | POA: Diagnosis not present

## 2022-11-26 NOTE — Progress Notes (Signed)
This patient returns to my office for at risk foot care.  This patient requires this care by a professional since this patient will be at risk due to having diabetes.    This patient is unable to cut nails herself since the patient cannot reach her nails.These nails are painful walking and wearing shoes.  This patient presents for at risk foot care today.  General Appearance  Alert, conversant and in no acute stress.  Vascular  Dorsalis pedis and posterior tibial  pulses are palpable  bilaterally.  Capillary return is within normal limits  bilaterally. Temperature is within normal limits  bilaterally.  Neurologic  Senn-Weinstein monofilament wire test within normal limits  bilaterally. Muscle power within normal limits bilaterally.  Nails Thick disfigured discolored nails with subungual debris  from hallux to fifth toes bilaterally. No evidence of bacterial infection or drainage bilaterally.  Orthopedic  No limitations of motion  feet .  No crepitus or effusions noted.  No bony pathology or digital deformities noted.  Skin  normotropic skin with no porokeratosis noted bilaterally.  No signs of infections or ulcers noted.     Onychomycosis  Pain in right toes  Pain in left toes  Consent was obtained for treatment procedures.   Mechanical debridement of nails 1-5  bilaterally performed with a nail nipper.  Filed with dremel without incident. Cauterize left hallux.   Return office visit   4 months                     Told patient to return for periodic foot care and evaluation due to potential at risk complications.   Helane Gunther DPM

## 2022-11-26 NOTE — Progress Notes (Unsigned)
Cardiology Office Note   Date:  11/30/2022   ID:  Alajha, Sampat 1956/06/05, MRN [id number]  PCP:  Kara Pacer, NP  Cardiologist:  Dr. Wyline Mood    Chief Complaint  Patient presents with   Coronary Artery Disease      History of Present Illness: [name] is a 67 y.o. female who presents for CAD PMH coronary calcifications (by prior CT Imaging with low-risk NST in 2015, NSTEMI in 01/2022 and medical management pursued given her multiple medical issues and substance abuse), HFmrEF (EF 40-45% by echo in 01/2022), HTN, HLD, Type II DM, cocaine use and COPD -severe, and  tracheobronchomalacia, chronic hypoxic respiratory failure with baseline oxygen at 4 to 5 L/min.  Has been on oxygen since 2019  Last saw [place] 04/20/22 --she was never able to have transportation for cardiac cath and she repeat echo with improvement of her EF to 65-70% from 40-45%.   She did have NSTEMI in 01/2022 and on CAT of chest had coronary calcifications.  Also noted lung nodule followed by pulmonary.  PET scan 04/2022 enlarging cavitary lesion in left apex with mild hypermetabolic activity suspicious for adenocarcinoma.  she did undergo radiation therapy Small hypermetabolic nodule in the left parotid lobe considered too high risk for biopsy s/p empiric SBRT-- is for another CT scan to see if radiation worked.   No angina, has not needed NTG.  She did fall due to tripping at [employer] and bumped her head.  Has abrasion.    She has episodes of dizziness and her BP is borderline.  She is not eating much. Not much appetite.  Wt down 28 lbs since last Oct.  We discussed protein shakes.   Has not needed diuretics recently    Past Medical History:  Diagnosis Date   Anemia    Anxiety    Arthritis    Bipolar 1 disorder (HCC)    CHF (congestive heart failure) (HCC)    COPD (chronic obstructive pulmonary disease) (HCC)    Depression    Diabetes mellitus without  complication (HCC)    Dyspnea    Emphysema of lung (HCC)    GERD (gastroesophageal reflux disease)    Headache    migraines   History of hiatal hernia    History of kidney stones    Hyperlipidemia    Hypertension    Neuromuscular disorder (HCC)    neuropathy   On home oxygen therapy    Pneumonia 2015, 2019   Tracheomalacia    Vaginal Pap smear, abnormal     Past Surgical History:  Procedure Laterality Date   CATARACT EXTRACTION W/PHACO Left 01/14/2020   Procedure: CATARACT EXTRACTION PHACO AND INTRAOCULAR LENS PLACEMENT LEFT EYE;  Surgeon: Fabio Pierce, MD;  Location: [facility];  Service: Ophthalmology;  Laterality: Left;  CDE: 8.18   CATARACT EXTRACTION W/PHACO Right 02/01/2020   Procedure: CATARACT EXTRACTION PHACO AND INTRAOCULAR LENS PLACEMENT RIGHT EYE;  Surgeon: Fabio Pierce, MD;  Location: [facility];  Service: Ophthalmology;  Laterality: Right;  CDE: 9.94   CHEILECTOMY Right 01/28/2020   Procedure: KELLER BUNION IMPLANT RIGHT FOOT CHEILECTOMY RIGHT ROOT;  Surgeon: Asencion Islam, DPM;  Location: [facility];  Service: Podiatry;  Laterality: Right;  BLOCK   CHOLECYSTECTOMY     COLONOSCOPY  July 2010   Dr. Lovell Sheehan: 3 rectal polyps, not enough tissue for pathologic examination, recommended surveillance in 3 years   COLONOSCOPY N/A 10/23/2014   RMR: Multiple colonic polyps removed as described  above. No endoscopic explaniation for abdominal pain. however. next tcs 10/2019   ESOPHAGOGASTRODUODENOSCOPY  July 2010   Dr. Lovell Sheehan: gastritis and duodenitis, H.pylori negative   ESOPHAGOGASTRODUODENOSCOPY N/A 10/23/2014   RMR: Normal EGD. Status post passage of a Maloney dilator. Today's finding s would not explain abdominal pain   EYE SURGERY Left 01/14/2020   cataract removal   GANGLION CYST EXCISION Left 09/07/2018   Procedure: REMOVAL GANGLION OF WRIST;  Surgeon: Vickki Hearing, MD;  Location: [facility];  Service: Orthopedics;  Laterality: Left;   HERNIA REPAIR     Dr. Lovell Sheehan   KIDNEY  STONE SURGERY     MALONEY DILATION N/A 10/23/2014   Procedure: Elease Hashimoto DILATION;  Surgeon: Corbin Ade, MD;  Location: [facility];  Service: Endoscopy;  Laterality: N/A;   OPEN REDUCTION INTERNAL FIXATION (ORIF) DISTAL RADIAL FRACTURE Right 12/09/2015   Procedure: OPEN REDUCTION INTERNAL FIXATION (ORIF) RIGHT DISTAL RADIUS;  Surgeon: Betha Loa, MD;  Location: [facility];  Service: Orthopedics;  Laterality: Right;     Current Outpatient Medications  Medication Sig Dispense Refill   Accu-Chek Softclix Lancets lancets Use as instructed to monitor glucose twice daily 100 each 12   acetaminophen (TYLENOL) 500 MG tablet Take 1 tablet (500 mg total) by mouth every 6 (six) hours as needed. (Patient taking differently: Take 1,000 mg by mouth every 6 (six) hours as needed.) 30 tablet 0   Budeson-Glycopyrrol-Formoterol (BREZTRI AEROSPHERE) 160-9-4.8 MCG/ACT AERO Inhale 2 puffs into the lungs 2 (two) times daily. 10.7 g 1   cetirizine (ZYRTEC) 10 MG tablet Take 10 mg by mouth daily as needed for allergies.      clopidogrel (PLAVIX) 75 MG tablet Take 1 tablet (75 mg total) by mouth daily. 30 tablet 2   clotrimazole (MYCELEX) 10 MG troche Take 1 tablet (10 mg total) by mouth 5 (five) times daily. 35 Troche 0   DULoxetine (CYMBALTA) 20 MG capsule Take 20 mg by mouth daily.     gabapentin (NEURONTIN) 400 MG capsule Take 1 capsule (400 mg total) by mouth 3 (three) times daily. 90 capsule 0   glucose blood (ACCU-CHEK GUIDE) test strip Use as instructed to monitor glucose twice daily 100 each 12   guaiFENesin-dextromethorphan (ROBITUSSIN DM) 100-10 MG/5ML syrup Take 5 mLs by mouth every 4 (four) hours as needed for cough. 118 mL 0   hydrOXYzine (VISTARIL) 25 MG capsule Take 25 mg by mouth 3 (three) times daily as needed for anxiety.     ipratropium (ATROVENT) 0.02 % nebulizer solution INHALE (1) VIAL VIA NEBULIZER EVERY FOUR HOURS AS NEEDED. 375 mL 3   levalbuterol (XOPENEX) 0.63 MG/3ML  nebulizer solution Take 3 mLs (0.63 mg total) by nebulization every 8 (eight) hours. 3 mL 12   meloxicam (MOBIC) 15 MG tablet Take 15 mg by mouth daily.     metFORMIN (GLUCOPHAGE) 1000 MG tablet Take 1 tablet (1,000 mg total) by mouth 2 (two) times daily. 180 tablet 3   nitroGLYCERIN (NITROSTAT) 0.4 MG SL tablet Place under the tongue.     nystatin (MYCOSTATIN) 100000 UNIT/ML suspension TAKE BY MOUTH TWICE DAILY FOR 7 DAYS. 70 mL 0   OLANZapine (ZYPREXA) 5 MG tablet Take 5 mg by mouth at bedtime.     omeprazole (PRILOSEC) 20 MG capsule Take 20 mg by mouth daily.     ondansetron (ZOFRAN-ODT) 4 MG disintegrating tablet Take 4 mg by mouth 2 (two) times daily as needed for nausea/vomiting.     pantoprazole (  PROTONIX) 40 MG tablet Take 40 mg by mouth daily.     rOPINIRole (REQUIP) 1 MG tablet Take 1 tablet (1 mg total) by mouth 3 (three) times daily. 90 tablet 0   simvastatin (ZOCOR) 20 MG tablet Take 20 mg by mouth daily at 6 PM.     SUMAtriptan (IMITREX) 25 MG tablet Take 25 mg by mouth every 2 (two) hours as needed for migraine.     torsemide (DEMADEX) 20 MG tablet Take 1 tablet (20 mg total) by mouth daily as needed. May take an additional 20 mg daily as needed for extreme swelling. 30 tablet 0   aspirin EC 81 MG tablet Take 1 tablet (81 mg total) by mouth daily. Swallow whole. (Patient not taking: Reported on 11/30/2022) 30 tablet 2   predniSONE (DELTASONE) 20 MG tablet Take 2 tablets (40 mg total) by mouth daily with breakfast. (Patient not taking: Reported on 11/30/2022) 10 tablet 0   thiamine 100 MG tablet Take 1 tablet (100 mg total) by mouth daily. (Patient not taking: Reported on 11/30/2022) 30 tablet 1   No current facility-administered medications for this visit.    Allergies:   Penicillins and Septra [sulfamethoxazole-trimethoprim]    Social History:  The patient  reports that she has been smoking cigarettes. She started smoking about 55 years ago. She has a 104.00 pack-year smoking  history. She has never used smokeless tobacco. She reports current alcohol use of about 1.0 standard drink of alcohol per week. She reports that she does not currently use drugs after having used the following drugs: Cocaine.   Family History:  The patient's family history is not on file. She was adopted.    ROS:  General:no colds or fevers, no weight changes Skin:no rashes or ulcers HEENT:no blurred vision, no congestion CV:see HPI PUL:see HPI GI:no diarrhea constipation or melena, no indigestion GU:no hematuria, no dysuria MS:no joint pain, no claudication Neuro:no syncope, no lightheadedness Endo:+ diabetes, no thyroid disease  Wt Readings from Last 3 Encounters:  11/30/22 176 lb (79.8 kg)  11/23/22 177 lb (80.3 kg)  10/08/22 189 lb 9.6 oz (86 kg)     PHYSICAL EXAM: VS:  BP (!) 100/58   Pulse 92   Ht 5\' 4"  (1.626 m)   Wt 176 lb (79.8 kg)   SpO2 93% Comment: On 4 liters oxygen  BMI 30.21 kg/m  , BMI Body mass index is 30.21 kg/m. General:Pleasant affect, NAD Skin:Warm and dry, brisk capillary refill HEENT:normocephalic, sclera clear, mucus membranes moist  has abrasion on forehead Neck:supple, no JVD, no bruits  Heart:S1S2 RRR without murmur, gallup, rub or click Lungs:clear without rales, rhonchi, or wheezes ZOX:WRUE, non tender, + BS, do not palpate liver spleen or masses Ext:no lower ext edema, 2+ pedal pulses, 2+ radial pulses Neuro:alert and oriented, MAE, follows commands, + facial symmetry    EKG:  EKG is ordered today. The ekg ordered today demonstrates not ordered From 09/24/22 ST 109 and no acute ST changes   Recent Labs: 01/05/2022: B Natriuretic Peptide 130.1 09/23/2022: TSH 1.264 09/24/2022: ALT 49 09/26/2022: BUN 6; Creatinine, Ser 0.43; Hemoglobin 9.5; Magnesium 1.2; Platelets 294; Potassium 4.1; Sodium 137    Lipid Panel    Component Value Date/Time   CHOL 197 07/18/2015 1113   TRIG 144 07/18/2015 1113   HDL 41 07/18/2015 1113   CHOLHDL 4.8 (H)  07/18/2015 1113   CHOLHDL 3.1 03/07/2014 0343   VLDL 40 03/07/2014 0343   LDLCALC 127 (H) 07/18/2015 1113  Other studies Reviewed: Additional studies/ records that were reviewed today include: . Echo 09/25/22 IMPRESSIONS     1. Left ventricular ejection fraction, by estimation, is 65 to 70%. The  left ventricle has normal function. The left ventricle has no regional  wall motion abnormalities. Left ventricular diastolic parameters were  normal.   2. Right ventricular systolic function is normal. The right ventricular  size is normal. Tricuspid regurgitation signal is inadequate for assessing  PA pressure.   3. The mitral valve is grossly normal. Trivial mitral valve  regurgitation.   4. The aortic valve is tricuspid. Aortic valve regurgitation is not  visualized.   5. The inferior vena cava is dilated in size with >50% respiratory  variability, suggesting right atrial pressure of 8 mmHg.   Comparison(s): Prior images reviewed side by side. LVEF vigorous at  65-70%.   FINDINGS   Left Ventricle: Left ventricular ejection fraction, by estimation, is 65  to 70%. The left ventricle has normal function. The left ventricle has no  regional wall motion abnormalities. The left ventricular internal cavity  size was normal in size. There is   borderline left ventricular hypertrophy. Left ventricular diastolic  parameters were normal.   Right Ventricle: The right ventricular size is normal. No increase in  right ventricular wall thickness. Right ventricular systolic function is  normal. Tricuspid regurgitation signal is inadequate for assessing PA  pressure.   Left Atrium: Left atrial size was normal in size.   Right Atrium: Right atrial size was normal in size.   Pericardium: There is no evidence of pericardial effusion. Presence of  epicardial fat layer.   Mitral Valve: The mitral valve is grossly normal. Trivial mitral valve  regurgitation.   Tricuspid Valve: The  tricuspid valve is grossly normal. Tricuspid valve  regurgitation is trivial.   Aortic Valve: The aortic valve is tricuspid. There is mild aortic valve  annular calcification. Aortic valve regurgitation is not visualized.   Pulmonic Valve: The pulmonic valve was grossly normal. Pulmonic valve  regurgitation is trivial.   Aorta: The aortic root is normal in size and structure.   Venous: The inferior vena cava is dilated in size with greater than 50%  respiratory variability, suggesting right atrial pressure of 8 mmHg.   IAS/Shunts: No atrial level shunt detected by color flow Doppler.   TTE 01/03/22 IMPRESSIONS     1. Septal and apical hypokinesis . Left ventricular ejection fraction, by  estimation, is 40 to 45%. The left ventricle has mildly decreased  function. The left ventricle demonstrates regional wall motion  abnormalities (see scoring diagram/findings for  description). The left ventricular internal cavity size was mildly  dilated. Left ventricular diastolic parameters are indeterminate.   2. Right ventricular systolic function is normal. The right ventricular  size is normal.   3. The mitral valve is normal in structure. No evidence of mitral valve  regurgitation.   4. The aortic valve is tricuspid. There is mild calcification of the  aortic valve. Aortic valve regurgitation is not visualized. Aortic valve  sclerosis is present, with no evidence of aortic valve stenosis.   FINDINGS   Left Ventricle: Septal and apical hypokinesis. Left ventricular ejection  fraction, by estimation, is 40 to 45%. The left ventricle has mildly  decreased function. The left ventricle demonstrates regional wall motion  abnormalities. The left ventricular  internal cavity size was mildly dilated. There is no left ventricular  hypertrophy. Left ventricular diastolic parameters are indeterminate.   Right Ventricle: The  right ventricular size is normal. No increase in  right ventricular wall  thickness. Right ventricular systolic function is  normal.   Left Atrium: Left atrial size was normal in size.   Right Atrium: Right atrial size was normal in size.   Pericardium: There is no evidence of pericardial effusion.   Mitral Valve: The mitral valve is normal in structure. No evidence of  mitral valve regurgitation.   Tricuspid Valve: The tricuspid valve is not well visualized. Tricuspid  valve regurgitation is mild.   Aortic Valve: The aortic valve is tricuspid. There is mild calcification  of the aortic valve. Aortic valve regurgitation is not visualized. Aortic  valve sclerosis is present, with no evidence of aortic valve stenosis.   Pulmonic Valve: The pulmonic valve was not well visualized. Pulmonic valve  regurgitation is not visualized.   Aorta: The aortic root is normal in size and structure.   IAS/Shunts: The interatrial septum was not well visualized.       ASSESSMENT AND PLAN:  1.  ICM EF 40-45%, she has no edema, euvolemic and 28 lb wt loss due to her lung cancer.  Stable  unable to use GDMT with wt loss and soft BP  will have her follow up in 4 months though sooner if chest pain develops.  2.  CAD by CT and hx of NSTEMI - we tried several times to have her undergo cardiac cath but she would not show up for different reasons.  Now asymptomatic, will continue her statin and if angina returns would do cardiac cath.  She has sl NTG  3.  HTN, now soft BP and episodes of dizziness.  She does not eat or drink much.  We discussed protein drinks.  She and her husband were willing to try.    4.  HLD continue crestor daily.  Followed by PCP  5.  Tobacco use she tries to stop, did receive patches but continues to smoke  6.  Lung cancer, has rec'd radiation therapy, waiting for follow up CT of chest/COPD  on home 02 4-5 L 24/7  7.  Hx of cocaine use, none recently per pt   Current medicines are reviewed with the patient today.  The patient Has no concerns  regarding medicines.  The following changes have been made:  See above Labs/ tests ordered today include:see above  Disposition:   FU:  see above  Signed, Nada Boozer, NP  11/30/2022 2:39 PM    [facility] [address] [place], [place], [place]  [address]/ [address] [place], [place] Phone: [phone]; Fax: [phone]  [phone]

## 2022-11-30 ENCOUNTER — Ambulatory Visit: Payer: 59 | Attending: Medical | Admitting: Cardiology

## 2022-11-30 ENCOUNTER — Encounter: Payer: Self-pay | Admitting: Cardiology

## 2022-11-30 ENCOUNTER — Telehealth: Payer: Self-pay | Admitting: Cardiology

## 2022-11-30 VITALS — BP 100/58 | HR 92 | Ht 64.0 in | Wt 176.0 lb

## 2022-11-30 DIAGNOSIS — I10 Essential (primary) hypertension: Secondary | ICD-10-CM

## 2022-11-30 DIAGNOSIS — E785 Hyperlipidemia, unspecified: Secondary | ICD-10-CM | POA: Diagnosis not present

## 2022-11-30 DIAGNOSIS — I255 Ischemic cardiomyopathy: Secondary | ICD-10-CM | POA: Diagnosis not present

## 2022-11-30 DIAGNOSIS — I2510 Atherosclerotic heart disease of native coronary artery without angina pectoris: Secondary | ICD-10-CM | POA: Diagnosis not present

## 2022-11-30 DIAGNOSIS — Z720 Tobacco use: Secondary | ICD-10-CM

## 2022-11-30 DIAGNOSIS — J449 Chronic obstructive pulmonary disease, unspecified: Secondary | ICD-10-CM

## 2022-11-30 NOTE — Patient Instructions (Signed)
Medication Instructions:  Your physician recommends that you continue on your current medications as directed. Please refer to the Current Medication list given to you today.   Labwork: None today   Testing/Procedures: None today  Follow-Up: 4 months  Any Other Special Instructions Will Be Listed Below (If Applicable).   Try protein drinks Ensure, Boost, Premier, Fair Life    If you need a refill on your cardiac medications before your next appointment, please call your pharmacy.

## 2022-11-30 NOTE — Telephone Encounter (Signed)
Pt c/o medication issue:  1. Name of Medication:   ondansetron (ZOFRAN-ODT) 4 MG disintegrating tablet    2. How are you currently taking this medication (dosage and times per day)? As prescribed   3. Are you having a reaction (difficulty breathing--STAT)? No   4. What is your medication issue? Wanting to know if the office can prescribe something else due to this nausea medicine not working. Reports she has taken two today and is still having severe nausea to the point of not being able to eat.

## 2022-12-01 NOTE — Telephone Encounter (Signed)
Per DPR, left message that she should speak with pcp regarding anti-nausea medication as this medication was not prescribed by cardiology.

## 2022-12-06 ENCOUNTER — Ambulatory Visit (HOSPITAL_COMMUNITY): Payer: 59

## 2022-12-12 ENCOUNTER — Ambulatory Visit (HOSPITAL_BASED_OUTPATIENT_CLINIC_OR_DEPARTMENT_OTHER): Payer: 59

## 2022-12-22 ENCOUNTER — Telehealth: Payer: Self-pay | Admitting: Pulmonary Disease

## 2022-12-22 NOTE — Telephone Encounter (Signed)
Patient states having symptoms of cough, mucus and wheezing. Pharmacy is [facility] [place]. Patient phone number is [phone].

## 2022-12-23 NOTE — Telephone Encounter (Signed)
Pt. Calling back to get medical advise please advise per message before

## 2022-12-23 NOTE — Telephone Encounter (Signed)
Called patient, she did not answer. Left message for her to call back.  

## 2022-12-24 MED ORDER — PREDNISONE 10 MG PO TABS: ORAL_TABLET | ORAL | 0 refills | Status: AC

## 2022-12-24 MED ORDER — DOXYCYCLINE HYCLATE 100 MG PO TABS: 100.0000 mg | ORAL_TABLET | Freq: Two times a day (BID) | ORAL | 0 refills | Status: DC

## 2022-12-24 NOTE — Telephone Encounter (Signed)
Called and spoke w/ pt let her know that I was sending in doxycycline and prednisone to preferred pharmacy. She verbalized understanding. NFN att.

## 2022-12-24 NOTE — Telephone Encounter (Signed)
Called and spoke w/ pt she verbalized that she is having wheezing, coughing w/ yellow sputum, nasal and chest congestion ongoing 3 days. She is worried because of her past hx of PNA and the fast onset.   Denies fevers.  Uses [name] for prescriptions.

## 2022-12-24 NOTE — Telephone Encounter (Signed)
Patient called back to get a prescription for an antibiotic. Patient states she is still coughing, mucus drainage and wheezing x3 days.   Please call patient on [phone] or spouse's number [phone]

## 2023-01-11 ENCOUNTER — Telehealth: Payer: Self-pay | Admitting: Pulmonary Disease

## 2023-01-11 NOTE — Telephone Encounter (Signed)
Patient requesting antibiotics called into [facility] due to congestion that is very yellow. States the antibiotic that was called in a few weeks ago did not seem to help (Doxycycline 100 mg BID x 7 days  Prednisone taper; 10 mg tablets: 4 tabs x 2 days, 3 tabs x 2 days, 2 tabs x 2 days 1 tab x 2 days then stop). Looks like this was sent in on 12/24/2022. Please advise.

## 2023-01-11 NOTE — Telephone Encounter (Signed)
Patient called back, while getting a nurse, she hung up

## 2023-01-12 MED ORDER — AZITHROMYCIN 250 MG PO TABS: 250.0000 mg | ORAL_TABLET | ORAL | 0 refills | Status: DC

## 2023-01-12 NOTE — Telephone Encounter (Signed)
Rebekah Milch, MD  to [name], [facility]     01/12/23  9:18 AM  Zpak  I called and spoke with pt and notified of response per RA  She verbalized understanding  I have sent rx to pharm  Nothing further needed

## 2023-01-19 ENCOUNTER — Ambulatory Visit (INDEPENDENT_AMBULATORY_CARE_PROVIDER_SITE_OTHER): Payer: 59 | Admitting: Pulmonary Disease

## 2023-01-19 ENCOUNTER — Encounter: Payer: Self-pay | Admitting: Pulmonary Disease

## 2023-01-19 VITALS — BP 144/81 | HR 107 | Ht 64.0 in | Wt 170.0 lb

## 2023-01-19 DIAGNOSIS — F17200 Nicotine dependence, unspecified, uncomplicated: Secondary | ICD-10-CM

## 2023-01-19 DIAGNOSIS — Z880 Allergy status to penicillin: Secondary | ICD-10-CM

## 2023-01-19 DIAGNOSIS — J441 Chronic obstructive pulmonary disease with (acute) exacerbation: Secondary | ICD-10-CM | POA: Diagnosis not present

## 2023-01-19 DIAGNOSIS — J9611 Chronic respiratory failure with hypoxia: Secondary | ICD-10-CM | POA: Diagnosis not present

## 2023-01-19 DIAGNOSIS — C3412 Malignant neoplasm of upper lobe, left bronchus or lung: Secondary | ICD-10-CM | POA: Diagnosis not present

## 2023-01-19 MED ORDER — PREDNISONE 10 MG PO TABS: ORAL_TABLET | ORAL | 0 refills | Status: AC

## 2023-01-19 MED ORDER — NYSTATIN 100000 UNIT/ML MT SUSP: 5.0000 mL | Freq: Two times a day (BID) | OROMUCOSAL | 0 refills | Status: AC

## 2023-01-19 NOTE — Patient Instructions (Signed)
X discontinue ipratropium  X Nystatin 5 ml swish & swallow twice daily x 5 days  X Prednisone 10 mg tabs  Take 2 tabs daily with food x 5ds, then 1 tab daily with food x 5ds then STOP  X Ct chest wo con - follow up on lung cancer

## 2023-01-19 NOTE — Assessment & Plan Note (Signed)
Recently treated with 2 rounds of antibiotics.  Her sputum is cleared but she still has some residual bronchospasm.  Will give her another course of steroids. Her prednisone allergy is remote and likely of no significance, we will refer her to allergy clinic for formal testing and possible desensitization  She will meanwhile continue on Breztri.  Advised her to DC ipratropium nebs

## 2023-01-19 NOTE — Assessment & Plan Note (Signed)
 Smoking cessation was again encouraged as the most important intervention

## 2023-01-19 NOTE — Assessment & Plan Note (Signed)
I encouraged her to use oxygen at all times including during sleep

## 2023-01-19 NOTE — Progress Notes (Signed)
   Subjective:    Patient ID: [name], female    DOB: 12/28/1955, 67 y.o.   MRN: [id number]  HPI  67 yo  smoker for FU severe COPD , tracheobronchomalacia, chronic hypoxic respiratory failure with baseline oxygen at 4 to 5 L/min.   Has been on oxygen since 2019 PMH - bipolar disorder and polysubstance abuse    She presented with cavitary lung mass in the left upper lobe measuring 2.7 cm, hypermetabolic on PET scan suspicious for underlying malignancy.  Due to severe COPD with significant oxygen demands at 4 to 5 L/min, she was considered high risk for biopsy and underwent  empiric SBRT - completed 09/21/2022  Chief Complaint  Patient presents with   Malignant neoplasm of upper lobe of left lung    COPD   She is accompanied by her husband today.  O2 concentrator has low battery, without oxygen saturation is 87%. She called for cough and sputum production on 6/19 was daughter was given doxycycline and prednisone. She called back on 7/10 and was given a Z-Pak She is compliant with Breztri.  She continues to report shortness of breath with activity, productive cough although this is now clear.  She also reports loss of appetite, headache, anxiety depression. She was advised CT chest after radiation therapy but has not obtained this yet.   She complains of severe dryness of mouth.  It is not clear whether she is still taking ipratropium nebs  Significant tests/ events reviewed] PFTs 1/ 2022 FEV1 50%, ratio 77, FVC 49%, no significant bronchodilator response, DLCO 50%      12/2020 bronchoscopy at [facility] [[email or web address]] showed tracheobronchomalacia   07/2022 CT chest cavitary portion has now filled up left upper lobe lesion is 2.4 x 1.6 cm in size, mostly unchanged from 2.2 x 1.9 cm in September   03/2022 CT chest showed a cavitary solid 2.7 cm apical left upper lobe pulmonary nodule increased in size.  Suspicious for underlying carcinoma.   Previous CT in 2019 showed airspace disease    PET scan 04/2022 enlarging cavitary lesion in left apex with mild hypermetabolic activity suspicious for adenocarcinoma.  Small hypermetabolic nodule in the left parotid lobe.  Review of Systems neg for any significant sore throat, dysphagia, itching, sneezing, nasal congestion or excess/ purulent secretions, fever, chills, sweats, unintended wt loss, pleuritic or exertional cp, hempoptysis, orthopnea pnd or change in chronic leg swelling. Also denies presyncope, palpitations, heartburn, abdominal pain, nausea, vomiting, diarrhea or change in bowel or urinary habits, dysuria,hematuria, rash, arthralgias, visual complaints, headache, numbness weakness or ataxia.     Objective:   Physical Exam  Gen. Pleasant, well-nourished, in no distress ENT - no thrush, no pallor/icterus,no post nasal drip Neck: No JVD, no thyromegaly, no carotid bruits Lungs: no use of accessory muscles, no dullness to percussion, BL scattered rhonchi  Cardiovascular: Rhythm regular, heart sounds  normal, no murmurs or gallops, no peripheral edema Musculoskeletal: No deformities, no cyanosis or clubbing        Assessment & Plan:

## 2023-01-19 NOTE — Assessment & Plan Note (Signed)
She underwent radiation therapy but is hesitant to get a CT scan.  We had a detailed discussion around this and I was able to convince her to proceed with CT chest without contrast

## 2023-01-24 ENCOUNTER — Other Ambulatory Visit: Payer: Self-pay | Admitting: Internal Medicine

## 2023-01-28 ENCOUNTER — Ambulatory Visit (HOSPITAL_COMMUNITY)
Admission: RE | Admit: 2023-01-28 | Discharge: 2023-01-28 | Disposition: A | Discharge status: Home / Self Care | Payer: 59 | Source: Ambulatory Visit | Attending: Pulmonary Disease | Admitting: Pulmonary Disease

## 2023-01-28 DIAGNOSIS — C3412 Malignant neoplasm of upper lobe, left bronchus or lung: Secondary | ICD-10-CM | POA: Insufficient documentation

## 2023-02-07 ENCOUNTER — Telehealth: Payer: Self-pay | Admitting: Pulmonary Disease

## 2023-02-08 MED ORDER — DOXYCYCLINE HYCLATE 100 MG PO TABS: 100.0000 mg | ORAL_TABLET | Freq: Two times a day (BID) | ORAL | 0 refills | Status: DC

## 2023-02-08 MED ORDER — PREDNISONE 10 MG PO TABS: ORAL_TABLET | ORAL | 0 refills | Status: AC

## 2023-02-08 NOTE — Telephone Encounter (Signed)
Patient calling back- waiting on medication to be called in. Please inform patient when medications are called in.

## 2023-02-08 NOTE — Telephone Encounter (Signed)
Spoke with patient and she reports 4 days of increased productive cough with yellow mucus, wheezing and shob. She denies fever/chills or other symptoms. She is requesting atbx and oral pred taper.   Also reviewed CT results with patient. Advised her to contact PCP re: T/L vertb fxs and rib fxs. Report forwarded to PCP via Epic. She will contact them today.   Please advise on current symptom treatment, thank you!

## 2023-02-08 NOTE — Telephone Encounter (Signed)
Rx sent to pharmacy   

## 2023-02-10 ENCOUNTER — Telehealth: Payer: Self-pay | Admitting: *Deleted

## 2023-02-10 MED ORDER — NYSTATIN 100000 UNIT/ML MT SUSP
5.0000 mL | Freq: Two times a day (BID) | OROMUCOSAL | 0 refills | Status: AC
Start: 2023-02-10 — End: 2023-02-20

## 2023-02-10 NOTE — Telephone Encounter (Signed)
Medication sent in and patient notified. 

## 2023-02-10 NOTE — Telephone Encounter (Signed)
Patient called and states she has symptoms of redness/painful "thrush" like symptoms in her mouth and would like to see if Dr. Vassie Loll would send over a prescription to [facility].  Please call and advise patient. [phone] or husband (OK per DPR) [phone]

## 2023-02-13 ENCOUNTER — Other Ambulatory Visit: Payer: Self-pay | Admitting: Internal Medicine

## 2023-02-21 ENCOUNTER — Telehealth: Payer: Self-pay | Admitting: *Deleted

## 2023-02-21 ENCOUNTER — Other Ambulatory Visit: Payer: Self-pay

## 2023-02-21 MED ORDER — NYSTATIN 100000 UNIT/ML MT SUSP: 5.0000 mL | Freq: Two times a day (BID) | OROMUCOSAL | 2 refills | Status: DC

## 2023-02-21 MED ORDER — PREDNISONE 10 MG PO TABS: ORAL_TABLET | ORAL | 0 refills | Status: DC

## 2023-02-21 NOTE — Telephone Encounter (Signed)
Called and spoke w/ pt she said that she is having wheezing , she stated that she is using her inhaler 2 puffs in the am and 1 pm. Pt is also doing neb. Every 4 hours. Pt last treatment was 2 hour go.pt feel the albuterol med.  For neb. Pt would like to switch back to Dnoneb that seems work better. Please advise

## 2023-02-21 NOTE — Telephone Encounter (Signed)
Patient called and states she is out of the medication that Dr. Vassie Loll prescribed her for the thrush like symptoms. Patient states she has also been wheezing and would like to discuss treatment options.  Patient uses [name]. Please call [phone]

## 2023-02-21 NOTE — Telephone Encounter (Signed)
Went over recommendations from Dr. Vassie Loll. Advised prescriptions have been sent to pharmacy. Closing encounter. nfn

## 2023-02-21 NOTE — Progress Notes (Signed)
[name]

## 2023-02-22 ENCOUNTER — Telehealth: Payer: Self-pay | Admitting: Pulmonary Disease

## 2023-02-22 NOTE — Telephone Encounter (Signed)
Patient states [name] was not called in yesterday. Patient uses [name]. Please advise

## 2023-02-24 NOTE — Telephone Encounter (Signed)
Pt calling stating her wheezing is getting bad and will like antibiotic sent  Pharmacy [facility]

## 2023-02-25 ENCOUNTER — Ambulatory Visit: Payer: 59 | Admitting: Podiatry

## 2023-02-25 NOTE — Telephone Encounter (Signed)
Pt calling stating her wheezing is getting bad and will like antibiotic sent

## 2023-02-28 NOTE — Telephone Encounter (Signed)
LVM for pt to return call

## 2023-03-02 NOTE — Telephone Encounter (Signed)
Patient has not returned call. Was given oral pred taper 02/21/23. Will close message at this time.

## 2023-03-11 ENCOUNTER — Ambulatory Visit: Payer: 59 | Admitting: Allergy & Immunology

## 2023-03-15 ENCOUNTER — Telehealth: Payer: Self-pay | Admitting: Pulmonary Disease

## 2023-03-15 ENCOUNTER — Encounter (INDEPENDENT_AMBULATORY_CARE_PROVIDER_SITE_OTHER): Payer: Self-pay | Admitting: *Deleted

## 2023-03-15 NOTE — Telephone Encounter (Signed)
Patient states she had a CT scan done at [facility] and has more questions regarding the results.  Please call [phone]

## 2023-03-15 NOTE — Telephone Encounter (Signed)
Spoke with patient and relayed results again. Simplified it a little for her for better understanding.

## 2023-03-16 ENCOUNTER — Telehealth: Payer: Self-pay | Admitting: Cardiology

## 2023-03-16 ENCOUNTER — Telehealth: Payer: Self-pay | Admitting: Pulmonary Disease

## 2023-03-16 NOTE — Telephone Encounter (Signed)
Patient states having symptoms of cough and mucus. Pharmacy is [name]. Patient phone number is [phone] and [phone].

## 2023-03-16 NOTE — Telephone Encounter (Signed)
Spoke to pt who stated that she thought she called pulmonology office.

## 2023-03-16 NOTE — Telephone Encounter (Signed)
Pt c/o medication issue:  1. Name of Medication: Steroid and/or antibiotic   2. How are you currently taking this medication (dosage and times per day)?   3. Are you having a reaction (difficulty breathing--STAT)?   4. What is your medication issue?  Patient states she has chest congestion currently and is worried it will get worse. She states that she has had a steroid and antibiotic called in previously and is requesting this be called in again. Please advise.

## 2023-03-16 NOTE — Telephone Encounter (Signed)
Left a message for patient to call office back .  

## 2023-03-17 ENCOUNTER — Other Ambulatory Visit: Payer: Self-pay

## 2023-03-17 MED ORDER — DOXYCYCLINE HYCLATE 100 MG PO TABS: 100.0000 mg | ORAL_TABLET | Freq: Two times a day (BID) | ORAL | 0 refills | Status: DC

## 2023-03-17 MED ORDER — PREDNISONE 10 MG PO TABS: ORAL_TABLET | ORAL | 0 refills | Status: DC

## 2023-03-17 NOTE — Telephone Encounter (Signed)
[name] is [employer] [facility]. Please submit PT calling again stating her breathing is getting worse. Dr. Has approved RX. Thanks so much.  PT has no car. Needs it sent in soon so Pharm can del to her.

## 2023-03-17 NOTE — Telephone Encounter (Signed)
Called and spoke w/ patient  she said that she been having congestion, wheezing , coughing/mucus , and rattling in her chest she said that  this has been going on for 3 days . She has been using albuterol inhaler prn q4  and neb treatment prn , and  breztri.pt is also using otc Robitussin she said that it helps some. Pt wanted to know if she could have an antibiotic and prednisone rx sent to the pharmacy , please advise

## 2023-03-17 NOTE — Telephone Encounter (Signed)
Called and let patient  that rx  was sent to pharmacy

## 2023-03-22 ENCOUNTER — Ambulatory Visit (INDEPENDENT_AMBULATORY_CARE_PROVIDER_SITE_OTHER): Payer: 59 | Admitting: Gastroenterology

## 2023-03-22 ENCOUNTER — Encounter (INDEPENDENT_AMBULATORY_CARE_PROVIDER_SITE_OTHER): Payer: Self-pay | Admitting: Gastroenterology

## 2023-03-22 VITALS — BP 134/77 | HR 93 | Temp 98.8°F | Ht 64.0 in | Wt 173.5 lb

## 2023-03-22 DIAGNOSIS — B370 Candidal stomatitis: Secondary | ICD-10-CM | POA: Diagnosis not present

## 2023-03-22 DIAGNOSIS — R1319 Other dysphagia: Secondary | ICD-10-CM

## 2023-03-22 MED ORDER — FLUCONAZOLE 100 MG PO TABS
200.0000 mg | ORAL_TABLET | Freq: Every day | ORAL | 0 refills | Status: AC
Start: 2023-03-22 — End: 2023-03-29

## 2023-03-22 MED ORDER — OMEPRAZOLE 20 MG PO CPDR: 20.0000 mg | DELAYED_RELEASE_CAPSULE | Freq: Every day | ORAL | 2 refills | Status: DC

## 2023-03-22 NOTE — Patient Instructions (Signed)
It was very nice to meet you today, as dicussed with will plan for the following :  1) Omeprazole 40mg  , 30 min before breakfast GERD recommendations:  1) Avoid coffee, tea, cola beverages, carbonated beverages, spicy foods, greasy foods, foods high in acid content (e.g. tomatoes and citrus fruits), chocolate, and peppermint 2) Avoid drinking alcoholic beverages 3) Avoid smoking 4) Eat small meals and keep weight within normal range 5) Avoid recumbent posture for 3 hours post-prandially 6) Elevate head of bed - Cut food in small pieces and chew food thoroughly to avoid regurgitation episodes.Marland Kitchen    2) Esophagram

## 2023-03-22 NOTE — Progress Notes (Signed)
Vista Lawman , M.D. Gastroenterology & Hepatology [facility] Gastroenterology [address] [place], [place] [address] Primary Care Physician: Kara Pacer, NP [address] [place] [address]  Chief Complaint:  Dysphagia    History of Present Illness: [name] is a 67 y.o. female with smoker with severe COPD , tracheobronchomalacia, chronic hypoxic respiratory failure with baseline oxygen at 4 to 5 L/min, bipolar disorder and polysubstance abuse, lung cancer underwent radiation therapy who presents for evaluation of Dysphagia        Patient reports that she has difficulty swallowing for most of her life where she had an endoscopy and esophagus was "stretched" many years ago.  Recently she reports dysphagia, food slowing down in middle of her chest is getting worse with mostly solids. Patient had multiple steroid exposure.   Patient had cavitary lung mass in the left upper lobe measuring 2.7 cm, hypermetabolic on PET scan suspicious for underlying malignancy.  Due to severe COPD with significant oxygen demands at 4 to 5 L/min, she was considered high risk for biopsy and underwent  empiric SBRT - completed 09/21/2022  Patient denies constipation, diarrhea, hematemesis, hematochezia, melena.  Last [id number]  Normal EGD. Status post passage of a Maloney dilator. Today' s findings would not explain abdominal pain.  Last Colonoscopy:2016  Multiple colonic polyps- removed as described above. No endoscopic explanation for abdominal pain, however.  ?Rectal EUS at [facility]  Surgical: Cholecystectomy  Past Medical History: Past Medical History:  Diagnosis Date   Anemia    Anxiety    Arthritis    Bipolar 1 disorder (HCC)    CHF (congestive heart failure) (HCC)    COPD (chronic obstructive pulmonary disease) (HCC)    Depression    Diabetes mellitus without complication (HCC)    Dyspnea    Emphysema of lung  (HCC)    GERD (gastroesophageal reflux disease)    Headache    migraines   History of hiatal hernia    History of kidney stones    Hyperlipidemia    Hypertension    Neuromuscular disorder (HCC)    neuropathy   On home oxygen therapy    Pneumonia 2015, 2019   Tracheomalacia    Vaginal Pap smear, abnormal     Past Surgical History: Past Surgical History:  Procedure Laterality Date   CATARACT EXTRACTION W/PHACO Left 01/14/2020   Procedure: CATARACT EXTRACTION PHACO AND INTRAOCULAR LENS PLACEMENT LEFT EYE;  Surgeon: Fabio Pierce, MD;  Location: [facility];  Service: Ophthalmology;  Laterality: Left;  CDE: 8.18   CATARACT EXTRACTION W/PHACO Right 02/01/2020   Procedure: CATARACT EXTRACTION PHACO AND INTRAOCULAR LENS PLACEMENT RIGHT EYE;  Surgeon: Fabio Pierce, MD;  Location: [facility];  Service: Ophthalmology;  Laterality: Right;  CDE: 9.94   CHEILECTOMY Right 01/28/2020   Procedure: KELLER BUNION IMPLANT RIGHT FOOT CHEILECTOMY RIGHT ROOT;  Surgeon: Asencion Islam, DPM;  Location: [facility];  Service: Podiatry;  Laterality: Right;  BLOCK   CHOLECYSTECTOMY     COLONOSCOPY  July 2010   Dr. Lovell Sheehan: 3 rectal polyps, not enough tissue for pathologic examination, recommended surveillance in 3 years   COLONOSCOPY N/A 10/23/2014   RMR: Multiple colonic polyps removed as described above. No endoscopic explaniation for abdominal pain. however. next tcs 10/2019   ESOPHAGOGASTRODUODENOSCOPY  July 2010   Dr. Lovell Sheehan: gastritis and duodenitis, H.pylori negative   ESOPHAGOGASTRODUODENOSCOPY N/A 10/23/2014   RMR: Normal EGD. Status post passage of a Maloney dilator. Today's finding  s would not explain abdominal pain   EYE SURGERY Left 01/14/2020   cataract removal   GANGLION CYST EXCISION Left 09/07/2018   Procedure: REMOVAL GANGLION OF WRIST;  Surgeon: Vickki Hearing, MD;  Location: [facility];  Service: Orthopedics;  Laterality: Left;   HERNIA REPAIR     Dr. Lovell Sheehan   KIDNEY STONE SURGERY     MALONEY  DILATION N/A 10/23/2014   Procedure: Elease Hashimoto DILATION;  Surgeon: Corbin Ade, MD;  Location: [facility];  Service: Endoscopy;  Laterality: N/A;   OPEN REDUCTION INTERNAL FIXATION (ORIF) DISTAL RADIAL FRACTURE Right 12/09/2015   Procedure: OPEN REDUCTION INTERNAL FIXATION (ORIF) RIGHT DISTAL RADIUS;  Surgeon: Betha Loa, MD;  Location: [facility];  Service: Orthopedics;  Laterality: Right;    Family History: Family History  Adopted: Yes    Social History: Social History   Tobacco Use  Smoking Status Every Day   Current packs/day: 2.00   Average packs/day: 2.0 packs/day for 55.7 years (111.4 ttl pk-yrs)   Types: Cigarettes   Start date: 1969   Passive exposure: Current  Smokeless Tobacco Never  Tobacco Comments   Smoking 1/2 a pack in 24 hours.  05/31/2022 hfb   Social History   Substance and Sexual Activity  Alcohol Use Not Currently   Alcohol/week: 1.0 standard drink of alcohol   Types: 1 Standard drinks or equivalent per week   Comment: alcohol abuse   Social History   Substance and Sexual Activity  Drug Use Not Currently   Types: Cocaine   Comment: clean since 2023    Allergies: Allergies  Allergen Reactions   Penicillins Other (See Comments)    Patient is unsure if she allergic to penicillin or septra. Patient states one or another caused "rib pain with a little breathing problem". Has patient had a PCN reaction causing immediate rash, facial/tongue/throat swelling, SOB or lightheadedness with hypotension: YES Has patient had a PCN reaction causing severe rash involving mucus membranes or skin necrosis: NO Has patient had a PCN reaction that required hospitalization: NO Has patient had a PCN reaction occurring within the last 10 years: NO   Septra [Sulfamethoxazole-Trimethoprim] Other (See Comments)    Patient is unsure if she allergic to septra or penicillin. Patient states one or another caused "rib pain with a little breathing problem".     Medications: Current Outpatient Medications  Medication Sig Dispense Refill   Accu-Chek Softclix Lancets lancets Use as instructed to monitor glucose twice daily 100 each 12   albuterol (PROVENTIL) (2.5 MG/3ML) 0.083% nebulizer solution INHALE 1 VIAL VIA NEBULIZER EVERY 4 HOURS 525 mL 5   albuterol (VENTOLIN HFA) 108 (90 Base) MCG/ACT inhaler Inhale 1-2 puffs into the lungs every 6 (six) hours as needed.     aspirin EC 81 MG tablet Take 1 tablet (81 mg total) by mouth daily. Swallow whole. 30 tablet 2   Budeson-Glycopyrrol-Formoterol (BREZTRI AEROSPHERE) 160-9-4.8 MCG/ACT AERO Inhale 2 puffs into the lungs 2 (two) times daily. 10.7 g 1   clopidogrel (PLAVIX) 75 MG tablet Take 1 tablet (75 mg total) by mouth daily. 30 tablet 2   diclofenac Sodium (VOLTAREN) 1 % GEL Apply topically 4 (four) times daily.     doxycycline (VIBRA-TABS) 100 MG tablet Take 1 tablet (100 mg total) by mouth 2 (two) times daily. 14 tablet 0   doxycycline (VIBRA-TABS) 100 MG tablet Take 1 tablet (100 mg total) by mouth 2 (two) times daily. 14 tablet 0   DULoxetine HCl 40  MG CPEP Take 1 capsule by mouth daily.     fluorometholone (FML) 0.1 % ophthalmic suspension SMARTSIG:In Eye(s)     gabapentin (NEURONTIN) 800 MG tablet Take 800 mg by mouth 3 (three) times daily.     glucose blood (ACCU-CHEK GUIDE) test strip Use as instructed to monitor glucose twice daily 100 each 12   guaiFENesin-dextromethorphan (ROBITUSSIN DM) 100-10 MG/5ML syrup Take 5 mLs by mouth every 4 (four) hours as needed for cough. 118 mL 0   hydrOXYzine (VISTARIL) 25 MG capsule Take 25 mg by mouth 3 (three) times daily as needed for anxiety.     ibuprofen (ADVIL) 800 MG tablet Take 800 mg by mouth every 8 (eight) hours as needed.     Ipratropium-Albuterol (COMBIVENT RESPIMAT) 20-100 MCG/ACT AERS respimat INHALE 1 PUFF INTO THE LUNGS EVERY (4) HOURS. 4 g 5   levalbuterol (XOPENEX) 0.63 MG/3ML nebulizer solution Take 3 mLs (0.63 mg total) by nebulization  every 8 (eight) hours. 3 mL 12   metFORMIN (GLUCOPHAGE) 1000 MG tablet Take 1 tablet (1,000 mg total) by mouth 2 (two) times daily. 180 tablet 3   nitroGLYCERIN (NITROSTAT) 0.4 MG SL tablet Place under the tongue.     nystatin (MYCOSTATIN) 100000 UNIT/ML suspension Take 5 mLs (500,000 Units total) by mouth in the morning and at bedtime. 60 mL 2   omeprazole (PRILOSEC) 20 MG capsule Take 20 mg by mouth daily.     ondansetron (ZOFRAN) 4 MG tablet Take 4 mg by mouth every 8 (eight) hours as needed.     pantoprazole (PROTONIX) 40 MG tablet Take 40 mg by mouth daily.     predniSONE (DELTASONE) 10 MG tablet Take 2 tabs daily with food x 5ds, then 1 tab daily with food x 5ds then STOP 20 tablet 0   rOPINIRole (REQUIP) 2 MG tablet Take 2 mg by mouth at bedtime.     rosuvastatin (CRESTOR) 10 MG tablet Take 10 mg by mouth daily.     SUMAtriptan (IMITREX) 25 MG tablet Take 25 mg by mouth every 2 (two) hours as needed for migraine.     torsemide (DEMADEX) 20 MG tablet Take 1 tablet (20 mg total) by mouth daily as needed. May take an additional 20 mg daily as needed for extreme swelling. 30 tablet 0   OLANZapine (ZYPREXA) 5 MG tablet Take 5 mg by mouth at bedtime. (Patient not taking: Reported on 03/22/2023)     thiamine 100 MG tablet Take 1 tablet (100 mg total) by mouth daily. (Patient not taking: Reported on 03/22/2023) 30 tablet 1   No current facility-administered medications for this visit.    Review of Systems: GENERAL: negative for malaise, night sweats HEENT: No changes in hearing or vision, no nose bleeds or other nasal problems. NECK: Negative for lumps, goiter, pain and significant neck swelling RESPIRATORY: Negative for cough, wheezing CARDIOVASCULAR: Negative for chest pain, leg swelling, palpitations, orthopnea GI: SEE HPI MUSCULOSKELETAL: Negative for joint pain or swelling, back pain, and muscle pain. SKIN: Negative for lesions, rash HEMATOLOGY Negative for prolonged bleeding, bruising  easily, and swollen nodes. ENDOCRINE: Negative for cold or heat intolerance, polyuria, polydipsia and goiter. NEURO: negative for tremor, gait imbalance, syncope and seizures. The remainder of the review of systems is noncontributory.   Physical Exam: BP 134/77 (BP Location: Left Arm, Patient Position: Sitting, Cuff Size: Large)   Pulse 93   Temp 98.8 F (37.1 C) (Oral)   Ht 5\' 4"  (1.626 m)   Wt 173 lb 8  oz (78.7 kg)   BMI 29.78 kg/m  GENERAL: The patient is AO x3, older than stated age on oxygen LUNGS: Decreased breath sounds bilaterally HEART: RRR, normal s1 and s2. ABDOMEN: Soft, nontender, no guarding, no peritoneal signs, and nondistended. BS +. No masses. NEUROLOGIC: AOx3, no focal motor deficit. SKIN: no jaundice, no rashes   Imaging/Labs: as above     Latest Ref Rng & Units 09/26/2022    3:31 AM 09/25/2022    3:49 AM 09/24/2022    4:07 AM  CBC  WBC 4.0 - 10.5 K/uL 8.2  8.3  10.6   Hemoglobin 12.0 - 15.0 g/dL 9.5  9.5  16.1   Hematocrit 36.0 - 46.0 % 29.4  29.0  33.0   Platelets 150 - 400 K/uL 294  279  332    No results found for: "IRON", "TIBC", "FERRITIN"  I personally reviewed and interpreted the available labs, imaging and endoscopic files.  Impression and Plan: DONNITA CARDINALE is a 67 y.o. female with smoker with severe COPD , tracheobronchomalacia, chronic hypoxic respiratory failure with baseline oxygen at 4 to 5 L/min, bipolar disorder and polysubstance abuse, lung cancer underwent radiation therapy who presents for evaluation of Dysphagia        #Dysphagia  Patient has chronic dysphagia with recent progressive solid food dysphagia.  Last endoscopy was 2016 without any deformity but had empiric dilation  On exam patient appears to have posterior oral thrush and may have Candida esophagitis as she had multiple exposure to oral steroids.  She also could have uncontrolled GERD as she is taking PPI incorrectly.  Advised to take PPI 30 to 60 minutes  before breakfast  Given exam finding will give empiric fluconazole and reassess dysphagia  Discussed the indication for diagnostic upper endoscopy given dysphagia, patient understands that she is very high risk for procedure related especially anesthesia related complications given her minimal respiratory reserves with severe COPD on 4 L oxygen and lung cancer.  Even to diagnose colon cancer she was treated empirically with radiation without biopsy as she was thought to have high risk from sedation. at this time patient wants to defer endoscopy evaluation given higher risk than benefit.  Recs:  Obtain esophagogram to evaluate for any prominent stricture or narrowing  Protonix 40 mg 30 minutes before breakfast  1) Avoid coffee, tea, cola beverages, carbonated beverages, spicy foods, greasy foods, foods high in acid content (e.g. tomatoes and citrus fruits), chocolate, and peppermint 2) Avoid drinking alcoholic beverages 3) Avoid smoking 4) Eat small meals and keep weight within normal range 5) Avoid recumbent posture for 3 hours post-prandially 6) Elevate head of bed  -Aspiration precautions  - Cut food in small pieces and chew food thoroughly to avoid regurgitation episodes.  - Discussed avoidance of eating within 3 hours of lying down to sleep and benefit of blocks to elevate head of bed. Also, will benefit from avoiding carbonated drinks/sodas or food that has tomatoes, spicy or greasy food.   All questions were answered.      Vista Lawman, MD Gastroenterology and Hepatology [facility] Gastroenterology   This chart has been completed using [facility] Dictation software, and while attempts have been made to ensure accuracy , certain words and phrases may not be transcribed as intended

## 2023-03-23 ENCOUNTER — Other Ambulatory Visit (HOSPITAL_COMMUNITY): Payer: Self-pay | Admitting: Adult Health

## 2023-03-23 ENCOUNTER — Telehealth: Payer: Self-pay | Admitting: Pulmonary Disease

## 2023-03-23 DIAGNOSIS — Z1231 Encounter for screening mammogram for malignant neoplasm of breast: Secondary | ICD-10-CM

## 2023-03-23 NOTE — Telephone Encounter (Signed)
Patient states she has multiple questions about her radiation treatment and would very much like to speak with Dr. Vassie Loll about it

## 2023-03-25 ENCOUNTER — Other Ambulatory Visit (HOSPITAL_COMMUNITY): Payer: Self-pay | Admitting: Neurosurgery

## 2023-03-25 DIAGNOSIS — M48062 Spinal stenosis, lumbar region with neurogenic claudication: Secondary | ICD-10-CM

## 2023-03-28 ENCOUNTER — Other Ambulatory Visit (INDEPENDENT_AMBULATORY_CARE_PROVIDER_SITE_OTHER): Payer: Self-pay | Admitting: Gastroenterology

## 2023-03-28 ENCOUNTER — Ambulatory Visit (HOSPITAL_COMMUNITY)
Admission: RE | Admit: 2023-03-28 | Discharge: 2023-03-28 | Disposition: A | Discharge status: Home / Self Care | Payer: 59 | Source: Ambulatory Visit | Attending: Gastroenterology | Admitting: Gastroenterology

## 2023-03-28 DIAGNOSIS — B370 Candidal stomatitis: Secondary | ICD-10-CM | POA: Diagnosis present

## 2023-03-28 DIAGNOSIS — R1319 Other dysphagia: Secondary | ICD-10-CM

## 2023-03-28 NOTE — Telephone Encounter (Signed)
LM for PT to call back for appt w/Dr. Vassie Loll who is no longer in [place].

## 2023-03-28 NOTE — Telephone Encounter (Signed)
Please schedule appointment with Vassie Loll or APP

## 2023-03-28 NOTE — Telephone Encounter (Signed)
Pt returning missed call.

## 2023-03-28 NOTE — Progress Notes (Signed)
Ann: Can you please refer this patient to Speech and Swallow Therapist ( SLP) evaluation and ENT?  Toniann Fail :  Can you please call and give the results for esophagogram patient recently had. Inform the patient that the choking and gagging sensation she is having after eating is most likely because she is aspirating which means the food instead of going into the food pipe is going into the lungs.  I recommend the patient follow-up with speech and swallow therapist, ENT and her PCP

## 2023-03-29 ENCOUNTER — Other Ambulatory Visit: Payer: Self-pay

## 2023-03-29 NOTE — Telephone Encounter (Signed)
Patient is returning phone call. Patient phone number is [phone].

## 2023-03-29 NOTE — Telephone Encounter (Signed)
Called and spoke with patient advised patient that  [name] was no longer [place] , offered  her an appt with Tammy P.  On 03/30/23 however patient wasn't able to come in b/c  she didn't have any to bring her and she will need a 3 day notice to arrange a way to come. Made an appt for 04/05/2023. However patient is wanting to know if she can have an rx prednisone sent to the pharmacy for sob and coughing , she stated that she has been using inhaler  and otc meds have not helped.

## 2023-03-29 NOTE — Telephone Encounter (Signed)
Prednisone 10 mg take  4 each am x 2 days,   2 each am x 2 days,  1 each am x 2 days and stop

## 2023-03-30 ENCOUNTER — Ambulatory Visit: Payer: 59 | Attending: Student | Admitting: Student

## 2023-03-30 ENCOUNTER — Encounter: Payer: Self-pay | Admitting: Student

## 2023-03-30 ENCOUNTER — Telehealth: Payer: Self-pay | Admitting: Student

## 2023-03-30 VITALS — BP 120/62 | HR 88 | Ht 64.0 in | Wt 177.0 lb

## 2023-03-30 DIAGNOSIS — E785 Hyperlipidemia, unspecified: Secondary | ICD-10-CM | POA: Diagnosis not present

## 2023-03-30 DIAGNOSIS — I10 Essential (primary) hypertension: Secondary | ICD-10-CM | POA: Diagnosis not present

## 2023-03-30 DIAGNOSIS — I2510 Atherosclerotic heart disease of native coronary artery without angina pectoris: Secondary | ICD-10-CM

## 2023-03-30 DIAGNOSIS — J449 Chronic obstructive pulmonary disease, unspecified: Secondary | ICD-10-CM

## 2023-03-30 DIAGNOSIS — I5032 Chronic diastolic (congestive) heart failure: Secondary | ICD-10-CM

## 2023-03-30 DIAGNOSIS — I5020 Unspecified systolic (congestive) heart failure: Secondary | ICD-10-CM

## 2023-03-30 MED ORDER — PREDNISONE 10 MG PO TABS: ORAL_TABLET | ORAL | 0 refills | Status: DC

## 2023-03-30 MED ORDER — TORSEMIDE 20 MG PO TABS: 20.0000 mg | ORAL_TABLET | Freq: Every day | ORAL | 0 refills | Status: DC | PRN

## 2023-03-30 NOTE — Progress Notes (Signed)
Cardiology Office Note    Date:  03/30/2023  ID:  [name], [name] 24-Sep-1955, MRN [id number] Cardiologist: Dina Rich, MD    History of Present Illness:    [name] is a 67 y.o. female with past medical history of coronary calcifications (by prior CT Imaging with low-risk NST in 2015, NSTEMI in 01/2022 and medical management pursued given her multiple medical issues and substance abuse, catheterization readdressed in 2023 but she canceled and no showed for the procedures), HFimpEF (EF 40-45% by echo in 01/2022, improved to 55-60% by echo in 04/2022), HTN, HLD, Type II DM, suspicious lung nodule (prior PET suspicious for adenocarcinoma and underwent empiric radiation therapy), cocaine use and COPD who presents to the office today for 40-month follow-up.  She was last examined by Nada Boozer, NP in 11/2022 and denied any recent anginal symptoms at that time. She reported episodes of dizziness and blood pressure had been variable when checked at home. This was at 100/58 at the time of her office visit. No changes were made to her medications at that time and she was continued on ASA 81mg  daily, Plavix 75 mg daily, Simvastatin 20 mg daily, Torsemide 20 mg as needed and PRN SL NTG.  In talking with the patient and her husband today, she has multiple noncardiac questions and concerns today. One of those being that her CT scan in 01/2023 showed interval decrease in a lung lesion but she is concerned that she does not have scheduled follow-up imaging for 6 months. She does have follow-up with Pulmonology in 04/2023 and we reviewed the importance of keeping that.  She was also recently diagnosed with aspiration and has been referred to speech therapy and ENT by GI. Reports having baseline dyspnea on exertion and uses 4 L nasal cannula. She does try to go without this at times and does not check her saturations when doing so. Denies any specific orthopnea or PND. She has experienced  worsening lower extremity edema over the past few days and reports adding salt to most of her meals. Denies any specific chest pain but does have occasional palpitations for a few seconds but no persistent symptoms. She denies any cocaine use over the past few months.  Studies Reviewed:   EKG: EKG is not ordered today.   Echocardiogram: 09/2022 IMPRESSIONS     1. Left ventricular ejection fraction, by estimation, is 65 to 70%. The  left ventricle has normal function. The left ventricle has no regional  wall motion abnormalities. Left ventricular diastolic parameters were  normal.   2. Right ventricular systolic function is normal. The right ventricular  size is normal. Tricuspid regurgitation signal is inadequate for assessing  PA pressure.   3. The mitral valve is grossly normal. Trivial mitral valve  regurgitation.   4. The aortic valve is tricuspid. Aortic valve regurgitation is not  visualized.   5. The inferior vena cava is dilated in size with >50% respiratory  variability, suggesting right atrial pressure of 8 mmHg.    Physical Exam:   VS:  BP 120/62   Pulse 88   Ht 5\' 4"  (1.626 m)   Wt 177 lb (80.3 kg)   SpO2 92%   BMI 30.38 kg/m    Wt Readings from Last 3 Encounters:  03/30/23 177 lb (80.3 kg)  03/22/23 173 lb 8 oz (78.7 kg)  01/19/23 170 lb (77.1 kg)     GEN: Pleasant female appearing in no acute distress NECK: No JVD; No carotid  bruits CARDIAC: RRR, no murmurs, rubs, gallops RESPIRATORY: Bilateral rhonchi. No wheezing or rales ABDOMEN: Appears non-distended. No obvious abdominal masses. EXTREMITIES: No clubbing or cyanosis. 1+ pitting edema up to mid-shins bilaterally.  Distal pedal pulses are 2+ bilaterally.   Assessment and Plan:   1. CAD - She previously had an NSTEMI in 01/2022 and this was medically managed at that time given her multiple medical issues during admission and due to substance use. - She has baseline dyspnea on exertion in the setting  of COPD and chronic hypoxic respiratory failure but denies any specific exertional chest pain. - She has been on DAPT with ASA and Plavix since her NSTEMI in 01/2022 and will review with Dr. Wyline Mood to see if Plavix can be discontinued given that she is a year out from her event. Continue Crestor 10 mg daily and PRN SL NTG.   2. Chronic HFimpEF - Her EF was previously at 40-45% by echo in 01/2022, improved to 55-60% by echo in 04/2022.  Most recent echocardiogram in 09/2022 showed this was stable at 65 to 70% with no wall motion abnormalities. - She does have some lower extremity edema on examination in the setting of dietary indiscretion.  We reviewed the importance of limiting her sodium intake. She has a prescription to take Torsemide as needed and I encouraged her to take 20 mg for the next 2 to 3 days and then use as needed going forward. We also reviewed she would benefit from obtaining daily weights and taking this if her weight increases by more than 3 pounds overnight or 5 pounds in 1 week.  3. HTN - She lost weight following her lung cancer diagnosis and is no longer on antihypertensive therapy.  Given her BP at 120/62 today, she does not require reinitiation of this.  4. HLD - LDL was at 50 in 05/2022. Continue current medical therapy with Crestor 10 mg daily.  5. Chronic Hypoxic Respiratory Failure/COPD/Presumed Adenocarcinoma of Left Lung - She previously underwent empiric radiation therapy to her left lung as felt to be too high risk for biopsy given her severe COPD. CT Scan in 01/2023 did show interval decrease in size of the left apical lung lesion. Followed by Pulmonology and will send a message to them to confirm that she is supposed to have follow-up imaging in 6 months. We also reviewed the indications for a mammogram based off nodular tissue by recent imaging and she plans to schedule this.   Signed, Ellsworth Lennox, PA-C

## 2023-03-30 NOTE — Patient Instructions (Signed)
Medication Instructions:   Take Torsemide 20 mg Daily for the next 2-3 Days   *If you need a refill on your cardiac medications before your next appointment, please call your pharmacy*   Lab Work: NONE   If you have labs (blood work) drawn today and your tests are completely normal, you will receive your results only by: MyChart Message (if you have MyChart) OR A paper copy in the mail If you have any lab test that is abnormal or we need to change your treatment, we will call you to review the results.   Testing/Procedures: NONE    Follow-Up: At [facility], you and your health needs are our priority.  As part of our continuing mission to provide you with exceptional heart care, we have created designated Provider Care Teams.  These Care Teams include your primary Cardiologist (physician) and Advanced Practice Providers (APPs -  Physician Assistants and Nurse Practitioners) who all work together to provide you with the care you need, when you need it.  We recommend signing up for the patient portal called "MyChart".  Sign up information is provided on this After Visit Summary.  MyChart is used to connect with patients for Virtual Visits (Telemedicine).  Patients are able to view lab/test results, encounter notes, upcoming appointments, etc.  Non-urgent messages can be sent to your provider as well.   To learn more about what you can do with MyChart, go to [email or web address].    Your next appointment:   6 month(s)  Provider:   You may see Dina Rich, MD or one of the following Advanced Practice Providers on your designated Care Team:   Randall An, PA-C  Jacolyn Reedy, [place]     Other Instructions Thank you for choosing [facility]!

## 2023-03-30 NOTE — Telephone Encounter (Signed)
Pt contacted and rx sent to pharmacy.

## 2023-03-30 NOTE — Telephone Encounter (Signed)
    Called the patient to let her know that I did review with Dr. Wyline Mood and she can stop Plavix 75 mg daily. Medication list updated.  Also heard back from Pulmonology and they did recommend a follow-up CT scan in 6 months given the decrease in size of her lesion by recent imaging. Has follow-up next month. She voiced understanding of this and was appreciative of the return call.  Signed, Ellsworth Lennox, PA-C 03/30/2023, 4:19 PM

## 2023-04-01 ENCOUNTER — Other Ambulatory Visit: Payer: Self-pay | Admitting: Pulmonary Disease

## 2023-04-01 ENCOUNTER — Telehealth: Payer: Self-pay | Admitting: Student

## 2023-04-01 NOTE — Telephone Encounter (Signed)
Ellsworth Lennox, PA-C      03/30/23  4:19 PM Note      Called the patient to let her know that I did review with Dr. Wyline Mood and she can stop Plavix 75 mg daily. Medication list updated.  04/01/23 I spoke with patient again and she has stopped the plavix. She is not sure why husband did not know that Dr.Branch had agreed with her stopping Plavix.

## 2023-04-01 NOTE — Telephone Encounter (Signed)
Pt's spouse is requesting a callback regarding pt being told to stop Plavix from another physician and they'd just like to get a second opinion. Please advise

## 2023-04-03 NOTE — Progress Notes (Unsigned)
[name], female    DOB: 1956/01/14   MRN: [id number]   Brief patient profile:  57 yowf active smoker  referred to pulmonary clinic in [place]  05/04/2021 by Dr  Celine Mans   Previous patient of Dr. Juanetta Gosling for COPD. Has been on oxygen since 2018. years. 4-5LNC.  FEV1 51% of predicted 1.46L in 2018. She turns up oxygen when she gets short of breath.  Last Joeseph Amor 11/05/2020 Rec pulmonary rehab   History of Present Illness  05/04/2021  Pulmonary/ 1st office eval/ [name] / [place] [facility] re GOLD 2 copd  Chief Complaint  Patient presents with   Follow-up    4-4.5L O2 cont. SOB and cough have worsened because of congestion. Coughing up green mucus.   On room air pt was at 89%-90% after walk form lobby to room. 94% on 4L O2 cont.after sitting in room.   Prev. Seen dr. Celine Mans but [facility] is closer.   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  Cough: some am congestion Sleep: sometimes cough noct bed flat / 2 pillows  SABA use: multiple forms 02 4lpm / pulse 02 4  Has overt hb despite reporting ppi bid  Rec Plan A = Automatic = Always=    stiolto 2 puffs 1st thing each am  Work on inhaler technique:  Plan B = Backup (to supplement plan A, not to replace it) Only use your albuterol inhaler (proair)as a rescue medication Plan C = Crisis (instead of Plan B but only if Plan B stops working) - only use your albuterol nebulizer if you first try Plan B and it fails to help > ok to use the nebulizer up to every 4 hours but if start needing it regularly call for immediate appointment Stop lisinopril and call me today if you are not 30 mg daily of lisinopril  Benicar (olmesartan) 20 mg daily in place of lisinopril Prednisone 10 mg take  4 each am x 2 days,   2 each am x 2 days,  1 each am x 2 days and stop  The key is to stop smoking completely before smoking completely stops you! Make sure you check your oxygen saturation  at your highest level of  activity  to be sure it stays over 90% and adjust  02 flow upward to maintain this level if needed but remember to turn it back to previous settings when you stop (to conserve your supply).     08/04/2021  f/u ov/[place] office/[name] re:  COPD 3/ 02 dep maint on stiolto  Chief Complaint  Patient presents with   Follow-up    Feels no improvement in breathing since last OV.   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[employer] but not checking 02 / does try walking some outside 100 ft MB Cough: smoker's rattle / yellowish esp in am Sleeping: bed is flat with 2 pillow  SABA use: once already hfa and neb 4 x daily plus combivent  02: 4 lpm /  POC 4  Covid status: vax x 3  Lung cancer screening: due f/u Rec We will be referring you to Kandice Robinsons NP for the lung cancer screening  Depomedrol 120 mg IM  Zpak  Plan A = Automatic = Always=    Stiolto 2 pffs each  Work on inhaler technique:  relax and gently blow all the way out then take a nice smooth full deep breath back in, triggering the inhaler at  same time you start reathing in.  Hold for up to 5 seconds if you can.     Plan B = Backup (to supplement plan A, not to replace it) Only use your albuterol inhaler as a rescue medication  Plan C = Crisis (instead of Plan B but only if Plan B stops working) - only use your albuterol nebulizer if you first try Plan B and it fails to help The key is to stop smoking completely before smoking completely stops you! Please schedule a follow up office visit in 2 weeks, sooner if needed  with all medications /inhalers/ solutions in hand    09/17/2021  f/u ov/[name] re: copd 3/02 dep    maint on ??? confused with meds did not bring them as rec  Chief Complaint  Patient presents with   Follow-up    Pt still has wheezing and congestion     Dyspnea:  little better with exertion  Cough: smoker's rattle  Sleeping: flat bed 2 pillows no resp cc SABA use: 3-4 inhaler, not needing  02: 4lpm hs  and up to 5lpm  daytime but not titrating by sats  Rec Plan A = Automatic = Always=    Breztri Take 2 puffs first thing in am and then another 2 puffs about 12 hours later.  Work on inhaler technique:    - remember how golfers take practice swings  Plan B = Backup (to supplement plan A, not to replace it) Only use your albuterol inhaler as a rescue medication Plan C = Crisis (instead of Plan B but only if Plan B stops working) - only use your albuterol nebulizer if you first try Plan B Prednisone 10 mg take  4 each am x 2 days,   2 each am x 2 days,  1 each am x 2 days and stop  Make sure you check your oxygen saturation  AT  your highest level of activity (not after you stop)   to be sure it stays over 90%  The key is to stop smoking completely before smoking completely stops you!  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    Also denies any obvious fluctuation of symptoms with weather or environmental changes or other aggravating or alleviating factors except as outlined above   No unusual exposure hx or h/o childhood pna/ asthma or knowledge of premature  birth.  Current Allergies, Complete  Past Medical History, Past Surgical History, Family History, and Social History were reviewed in [employer] record.  ROS  The following are not active complaints unless bolded Hoarseness, sore throat, dysphagia= globus (on omeprazole 20) , dental problems, itching, sneezing,  nasal congestion or discharge of excess mucus or purulent secretions, ear ache,   fever, chills, sweats, unintended wt loss or wt gain, classically pleuritic or exertional cp,  orthopnea pnd or arm/hand swelling  or leg swelling(min), presyncope, palpitations, abdominal pain, anorexia, nausea, vomiting, diarrhea  or change in bowel habits or change in bladder habits, change in stools or change in urine, dysuria, hematuria,  rash, arthralgias, visual complaints, headache, numbness, weakness or ataxia or problems with walking or coordination,  change in mood or  memory.        Current Meds  Medication Sig   Accu-Chek Softclix Lancets lancets Use as instructed to monitor glucose twice daily   albuterol (PROVENTIL) (2.5 MG/3ML) 0.083% nebulizer solution INHALE 1 VIAL VIA NEBULIZER EVERY 4 HOURS   albuterol (VENTOLIN HFA) 108 (90 Base) MCG/ACT inhaler Inhale 1-2 puffs into the lungs every 6 (six) hours as needed.   aspirin EC 81 MG tablet Take 1 tablet (81 mg total) by mouth daily. Swallow whole.   Budeson-Glycopyrrol-Formoterol (BREZTRI AEROSPHERE) 160-9-4.8 MCG/ACT AERO Inhale 2 puffs into the lungs 2 (two) times daily.   diclofenac Sodium (VOLTAREN) 1 % GEL Apply topically 4 (four) times daily.   doxycycline (VIBRA-TABS) 100 MG tablet Take 1 tablet (100 mg total) by mouth 2 (two) times daily.   doxycycline (VIBRA-TABS) 100 MG tablet Take 1 tablet (100 mg total) by mouth 2 (two) times daily.   DULoxetine HCl 40 MG CPEP Take 1 capsule by mouth daily.   fluorometholone (FML) 0.1 % ophthalmic suspension SMARTSIG:In Eye(s)   gabapentin (NEURONTIN) 800 MG tablet Take 800 mg  by mouth 3 (three) times daily.   glucose blood (ACCU-CHEK GUIDE) test strip Use as instructed to monitor glucose twice daily   guaiFENesin-dextromethorphan (ROBITUSSIN DM) 100-10 MG/5ML syrup Take 5 mLs by mouth every 4 (four) hours as needed for cough.   hydrOXYzine (VISTARIL) 25 MG capsule Take 25 mg by mouth 3 (three) times daily as needed for anxiety.   ibuprofen (ADVIL) 800 MG tablet Take 800 mg by mouth every 8 (eight) hours as needed.   Ipratropium-Albuterol (COMBIVENT RESPIMAT) 20-100 MCG/ACT AERS respimat INHALE 1 PUFF INTO THE LUNGS EVERY (4) HOURS.   metFORMIN (GLUCOPHAGE) 1000 MG tablet Take 1 tablet (1,000 mg total) by mouth 2 (two) times daily.   nitroGLYCERIN (NITROSTAT) 0.4 MG SL tablet Place under the tongue.   nystatin (MYCOSTATIN) 100000 UNIT/ML suspension TAKE ONE TEASPOONFUL ( ) BY MOUTH EVERY MORNING AND AT BEDTIME   OLANZapine (ZYPREXA) 5 MG tablet Take 5 mg by mouth at bedtime.   omeprazole (PRILOSEC) 20 MG capsule Take 1 capsule (20 mg total) by mouth daily.   rOPINIRole (REQUIP) 2 MG tablet Take 2 mg by mouth at bedtime.   rosuvastatin (CRESTOR) 10 MG tablet Take 10 mg by mouth daily.   SUMAtriptan (IMITREX) 25 MG tablet Take 25 mg by mouth every 2 (two) hours as needed for migraine.   torsemide (DEMADEX) 20 MG tablet Take 1 tablet (20 mg total) by mouth daily as needed. May take an additional 20 mg daily as needed for swelling.  Past Medical History:  Diagnosis Date   Anemia    Anxiety    Arthritis    Bipolar 1 disorder (HCC)    CHF (congestive heart failure) (HCC)    COPD (chronic obstructive pulmonary disease) (HCC)    Depression    Diabetes mellitus without complication (HCC)    Dyspnea    Emphysema of lung (HCC)    GERD (gastroesophageal reflux disease)    Headache    migraines   History of hiatal hernia    History of kidney stones    Hyperlipidemia    Hypertension    Neuromuscular disorder (HCC)    neuropathy    On home oxygen therapy    Pneumonia 2015, 2019   Tracheomalacia    Vaginal Pap smear, abnormal         Objective:    Wts  04/05/2023      176   09/17/2021       224   08/04/21 230 lb 1.3 oz (104.4 kg)  05/04/21 226 lb 1.9 oz (102.6 kg)  04/17/21 223 lb 6.4 oz (101.3 kg)    Vital signs reviewed  04/05/2023  - Note at rest 02 sats  88% on RA on arrival    General appearance:    chronically ill amb wf nad    HEENT : Oropharynx  clear  Nasal turbinates nl    NECK :  without  apparent JVD/ palpable Nodes/TM    LUNGS: no acc muscle use,  Mild barrel  contour chest wall with bilateral  Distant pan exp wheeze and  without cough on insp or exp maneuvers  and mild  Hyperresonant  to  percussion bilaterally     CV:  RRR  no s3 or murmur or increase in P2, and no edema   ABD:  soft and nontender with pos end  insp Hoover's  in the supine position.  No bruits or organomegaly appreciated   MS:  Nl gait/ ext warm without deformities Or obvious joint restrictions  calf tenderness, cyanosis or clubbing     SKIN: warm and dry without lesions    NEURO:  alert, approp, nl sensorium with  no motor or cerebellar deficits apparent.            I personally reviewed images and agree with radiology impression as follows:   Chest CT 01/28/23    w/o contrast    1. Interval decrease in size of treated left apical lung lesion. 2. No evidence of metastatic disease within the chest. 3. Chronic postinflammatory changes within both lungs. 4. New superior endplate fracture deformities involving the T12 and L1 vertebra. 5. Multiple new subacute and chronic left posterior rib fractures. 6. Asymmetric nodular soft tissue within the lateral left breast appears unchanged from the previous exam. Correlation with mammography is recommended. 7. Aortic Atherosclerosis (ICD10-I70.0).      Assessment

## 2023-04-05 ENCOUNTER — Encounter: Payer: Self-pay | Admitting: Internal Medicine

## 2023-04-05 ENCOUNTER — Ambulatory Visit (INDEPENDENT_AMBULATORY_CARE_PROVIDER_SITE_OTHER): Payer: 59 | Admitting: Internal Medicine

## 2023-04-05 VITALS — BP 142/80 | HR 100 | Ht 64.0 in | Wt 176.4 lb

## 2023-04-05 DIAGNOSIS — J449 Chronic obstructive pulmonary disease, unspecified: Secondary | ICD-10-CM | POA: Diagnosis not present

## 2023-04-05 DIAGNOSIS — F17210 Nicotine dependence, cigarettes, uncomplicated: Secondary | ICD-10-CM | POA: Diagnosis not present

## 2023-04-05 DIAGNOSIS — J9611 Chronic respiratory failure with hypoxia: Secondary | ICD-10-CM | POA: Diagnosis not present

## 2023-04-05 MED ORDER — BREZTRI AEROSPHERE 160-9-4.8 MCG/ACT IN AERO: 2.0000 | INHALATION_SPRAY | Freq: Two times a day (BID) | RESPIRATORY_TRACT | Status: DC

## 2023-04-05 MED ORDER — PANTOPRAZOLE SODIUM 40 MG PO TBEC: 40.0000 mg | DELAYED_RELEASE_TABLET | Freq: Every day | ORAL | 2 refills | Status: DC

## 2023-04-05 MED ORDER — PREDNISONE 10 MG PO TABS: ORAL_TABLET | ORAL | 0 refills | Status: DC

## 2023-04-05 MED ORDER — FAMOTIDINE 20 MG PO TABS: ORAL_TABLET | ORAL | 11 refills | Status: DC

## 2023-04-05 NOTE — Assessment & Plan Note (Addendum)
Active smoker -  05/04/2021   try change to stiolto and off acei - 08/04/2021  After extensive coaching inhaler device,  effectiveness =  75% with stiolto from a baseline of 25%  - 09/17/2021  After extensive coaching inhaler device,  effectiveness =    50% with hfa > continue breztri / approp saba and Prednisone 10 mg take  4 each am x 2 days,   2 each am x 2 days,  1 each am x 2 days and stop and regroup in 6 weeks with all meds in hand > did not do  >>> 04/05/2023  After extensive coaching inhaler device,  effectiveness =   50% from a baseline near 0 > continue beztri, low threshold for trial of trelegy or add ohtuvayre depending on whether issue is hfa vs dpi   Still having aecopd s/p doxy  >>> Prednisone 10 mg take  4 each am x 2 days,   2 each am x 2 days,  1 each am x 2 days and stop plus max rx for gerd see avs for instructions unique to this ov    Obviously Group D (now reclassified as E) in terms of symptom/risk and laba/lama/ICS  therefore appropriate rx at this point >>>  breztri plus approp saba  F/u  6 weeks with all meds in hand using a trust but verify approach to confirm accurate Medication  Reconciliation The principal here is that until we are certain that the  patients are doing what we've asked, it makes no sense to ask them to do more.

## 2023-04-05 NOTE — Patient Instructions (Signed)
Make sure you check your oxygen saturation  AT  your highest level of activity (not after you stop)   to be sure it stays over 90% and adjust  02 flow upward to maintain this level if needed but remember to turn it back to previous settings when you stop (to conserve your supply).    Plan A = Automatic = Always=    Breztri Take 2 puffs first thing in am and then another 2 puffs about 12 hours later.    Work on inhaler technique:  relax and gently blow all the way out then take a nice smooth full deep breath back in, triggering the inhaler at same time you start breathing in.  Hold breath in for at least  5 seconds if you can. Blow out breztri  thru nose. Rinse and gargle with water when done.  If mouth or throat bother you at all,  try brushing teeth/gums/tongue with arm and hammer toothpaste/ make a slurry and gargle and spit out.   >>>  Remember how golfers warm up by taking practice swings - do this with an empty inhaler     Plan B = Backup (to supplement plan A, not to replace it) Only use your albuterol inhaler as a rescue medication to be used if you can't catch your breath by resting or doing a relaxed purse lip breathing pattern.  - The less you use it, the better it will work when you need it. - Ok to use the inhaler up to 2 puffs  every 4 hours if you must but call for appointment if use goes up over your usual need - Don't leave home without it !!  (think of it like the spare tire for your car)   Plan C = Crisis (instead of Plan B but only if Plan B stops working) - only use your albuterol nebulizer if you first try Plan B and it fails to help > ok to use the nebulizer up to every 4 hours but if start needing it regularly call for immediate appointment   Prednisone 10 mg take  4 each am x 2 days,   2 each am x 2 days,  1 each am x 2 days and stop   Pantoprazole (protonix) 40 mg   Take  30-60 min before first meal of the day and Pepcid (famotidine)  20 mg after supper until return to  office - this is the best way to tell whether stomach acid is contributing to your problem.    Make sure you check your oxygen saturation  AT  your highest level of activity (not after you stop)   to be sure it stays over 90% and adjust  02 flow upward to maintain this level if needed but remember to turn it back to previous settings when you stop (to conserve your supply).    Please schedule a follow up office visit in 6 weeks, call sooner if needed with all medications /inhalers/ solutions in hand so we can verify exactly what you are taking. This includes all medications from all doctors and over the counters

## 2023-04-05 NOTE — Assessment & Plan Note (Signed)
Counseled re importance of smoking cessation but did not meet time criteria for separate billing    Each maintenance medication was reviewed in detail including emphasizing most importantly the difference between maintenance and prns and under what circumstances the prns are to be triggered using an action plan format where appropriate.  Total time for H and P, chart review, counseling, reviewing hfa/neb/02/pulse ox  device(s) , directly observing portions of ambulatory 02 saturation study/ and generating customized AVS unique to this office visit / same day charting  >40 min for multiple acute and chronic  refractory respiratory  symptoms /problems

## 2023-04-05 NOTE — Assessment & Plan Note (Signed)
04/05/2023 pt was walking mod pace  on RA x  150 ft  then  o2 satsdropped 86 , pt finished 3 lap with 3l of oxygen with lowest sats 91%  As of 04/05/2023 rec 4lpm hs and at leasat 3lpm walking but ok at rest RA  Advised: Make sure you check your oxygen saturation  AT  your highest level of activity (not after you stop)   to be sure it stays over 90% and adjust  02 flow upward to maintain this level if needed but remember to turn it back to previous settings when you stop (to conserve your supply).

## 2023-04-12 ENCOUNTER — Other Ambulatory Visit (HOSPITAL_COMMUNITY): Payer: Self-pay | Admitting: Occupational Therapy

## 2023-04-12 DIAGNOSIS — R1312 Dysphagia, oropharyngeal phase: Secondary | ICD-10-CM

## 2023-04-12 DIAGNOSIS — K219 Gastro-esophageal reflux disease without esophagitis: Secondary | ICD-10-CM

## 2023-04-13 ENCOUNTER — Ambulatory Visit (HOSPITAL_COMMUNITY): Payer: 59

## 2023-04-16 ENCOUNTER — Other Ambulatory Visit: Payer: Self-pay

## 2023-04-16 ENCOUNTER — Inpatient Hospital Stay (HOSPITAL_COMMUNITY)
Admission: EM | Admit: 2023-04-16 | Discharge: 2023-04-19 | DRG: 190 | Disposition: A | Discharge status: Home / Self Care | Payer: 59 | Attending: Family Medicine | Admitting: Family Medicine

## 2023-04-16 ENCOUNTER — Emergency Department (HOSPITAL_COMMUNITY): Payer: 59

## 2023-04-16 ENCOUNTER — Observation Stay (HOSPITAL_COMMUNITY): Payer: 59

## 2023-04-16 DIAGNOSIS — J9611 Chronic respiratory failure with hypoxia: Secondary | ICD-10-CM | POA: Diagnosis present

## 2023-04-16 DIAGNOSIS — Z860100 Personal history of colon polyps, unspecified: Secondary | ICD-10-CM

## 2023-04-16 DIAGNOSIS — J439 Emphysema, unspecified: Secondary | ICD-10-CM | POA: Diagnosis present

## 2023-04-16 DIAGNOSIS — Z7984 Long term (current) use of oral hypoglycemic drugs: Secondary | ICD-10-CM

## 2023-04-16 DIAGNOSIS — F419 Anxiety disorder, unspecified: Secondary | ICD-10-CM | POA: Diagnosis present

## 2023-04-16 DIAGNOSIS — G9341 Metabolic encephalopathy: Secondary | ICD-10-CM | POA: Diagnosis present

## 2023-04-16 DIAGNOSIS — I10 Essential (primary) hypertension: Secondary | ICD-10-CM | POA: Diagnosis present

## 2023-04-16 DIAGNOSIS — E785 Hyperlipidemia, unspecified: Secondary | ICD-10-CM | POA: Diagnosis present

## 2023-04-16 DIAGNOSIS — F17210 Nicotine dependence, cigarettes, uncomplicated: Secondary | ICD-10-CM | POA: Diagnosis present

## 2023-04-16 DIAGNOSIS — Z1152 Encounter for screening for COVID-19: Secondary | ICD-10-CM | POA: Diagnosis not present

## 2023-04-16 DIAGNOSIS — R234 Changes in skin texture: Secondary | ICD-10-CM | POA: Diagnosis present

## 2023-04-16 DIAGNOSIS — Z7982 Long term (current) use of aspirin: Secondary | ICD-10-CM | POA: Diagnosis not present

## 2023-04-16 DIAGNOSIS — I5030 Unspecified diastolic (congestive) heart failure: Secondary | ICD-10-CM | POA: Diagnosis present

## 2023-04-16 DIAGNOSIS — R6510 Systemic inflammatory response syndrome (SIRS) of non-infectious origin without acute organ dysfunction: Secondary | ICD-10-CM | POA: Diagnosis present

## 2023-04-16 DIAGNOSIS — E860 Dehydration: Secondary | ICD-10-CM | POA: Diagnosis present

## 2023-04-16 DIAGNOSIS — E1169 Type 2 diabetes mellitus with other specified complication: Secondary | ICD-10-CM

## 2023-04-16 DIAGNOSIS — I110 Hypertensive heart disease with heart failure: Secondary | ICD-10-CM | POA: Diagnosis present

## 2023-04-16 DIAGNOSIS — K219 Gastro-esophageal reflux disease without esophagitis: Secondary | ICD-10-CM | POA: Diagnosis present

## 2023-04-16 DIAGNOSIS — R000 Tachycardia, unspecified: Secondary | ICD-10-CM | POA: Diagnosis present

## 2023-04-16 DIAGNOSIS — Z87442 Personal history of urinary calculi: Secondary | ICD-10-CM

## 2023-04-16 DIAGNOSIS — T1490XA Injury, unspecified, initial encounter: Secondary | ICD-10-CM | POA: Diagnosis present

## 2023-04-16 DIAGNOSIS — Z79899 Other long term (current) drug therapy: Secondary | ICD-10-CM

## 2023-04-16 DIAGNOSIS — I5032 Chronic diastolic (congestive) heart failure: Secondary | ICD-10-CM | POA: Diagnosis present

## 2023-04-16 DIAGNOSIS — D72829 Elevated white blood cell count, unspecified: Secondary | ICD-10-CM | POA: Diagnosis present

## 2023-04-16 DIAGNOSIS — E119 Type 2 diabetes mellitus without complications: Secondary | ICD-10-CM | POA: Diagnosis present

## 2023-04-16 DIAGNOSIS — Z23 Encounter for immunization: Secondary | ICD-10-CM

## 2023-04-16 DIAGNOSIS — Z9981 Dependence on supplemental oxygen: Secondary | ICD-10-CM

## 2023-04-16 DIAGNOSIS — Z791 Long term (current) use of non-steroidal anti-inflammatories (NSAID): Secondary | ICD-10-CM

## 2023-04-16 DIAGNOSIS — R0902 Hypoxemia: Secondary | ICD-10-CM

## 2023-04-16 DIAGNOSIS — F332 Major depressive disorder, recurrent severe without psychotic features: Secondary | ICD-10-CM | POA: Diagnosis present

## 2023-04-16 DIAGNOSIS — F17200 Nicotine dependence, unspecified, uncomplicated: Secondary | ICD-10-CM | POA: Diagnosis present

## 2023-04-16 DIAGNOSIS — Z8701 Personal history of pneumonia (recurrent): Secondary | ICD-10-CM

## 2023-04-16 DIAGNOSIS — F319 Bipolar disorder, unspecified: Secondary | ICD-10-CM | POA: Diagnosis present

## 2023-04-16 DIAGNOSIS — J441 Chronic obstructive pulmonary disease with (acute) exacerbation: Principal | ICD-10-CM | POA: Diagnosis present

## 2023-04-16 DIAGNOSIS — G43909 Migraine, unspecified, not intractable, without status migrainosus: Secondary | ICD-10-CM | POA: Diagnosis present

## 2023-04-16 LAB — COMPREHENSIVE METABOLIC PANEL
ALT: 22 U/L (ref 0–44)
AST: 21 U/L (ref 15–41)
Albumin: 3.5 g/dL (ref 3.5–5.0)
Alkaline Phosphatase: 149 U/L — ABNORMAL HIGH (ref 38–126)
Anion gap: 14 (ref 5–15)
BUN: 7 mg/dL — ABNORMAL LOW (ref 8–23)
CO2: 30 mmol/L (ref 22–32)
Calcium: 11.1 mg/dL — ABNORMAL HIGH (ref 8.9–10.3)
Chloride: 92 mmol/L — ABNORMAL LOW (ref 98–111)
Creatinine, Ser: 0.58 mg/dL (ref 0.44–1.00)
GFR, Estimated: >60 mL/min (ref >60)
Glucose, Bld: 93 mg/dL (ref 70–99)
Potassium: 3.4 mmol/L — ABNORMAL LOW (ref 3.5–5.1)
Sodium: 136 mmol/L (ref 135–145)
Total Bilirubin: 1 mg/dL (ref 0.3–1.2)
Total Protein: 6.7 g/dL (ref 6.5–8.1)

## 2023-04-16 LAB — BLOOD GAS, VENOUS
Acid-Base Excess: 12.1 mmol/L — ABNORMAL HIGH (ref 0.0–2.0)
Bicarbonate: 37.3 mmol/L — ABNORMAL HIGH (ref 20.0–28.0)
Drawn by: 44828
O2 Saturation: 84.1 %
Patient temperature: 37.1
pCO2, Ven: 49 mm[Hg] (ref 44–60)
pH, Ven: 7.49 — ABNORMAL HIGH (ref 7.25–7.43)
pO2, Ven: 45 mm[Hg] (ref 32–45)

## 2023-04-16 LAB — GLUCOSE, CAPILLARY
Glucose-Capillary: 156 mg/dL — ABNORMAL HIGH (ref 70–99)
Glucose-Capillary: 169 mg/dL — ABNORMAL HIGH (ref 70–99)

## 2023-04-16 LAB — CBC WITH DIFFERENTIAL/PLATELET
Abs Immature Granulocytes: 0.05 10*3/uL (ref 0.00–0.07)
Basophils Absolute: 0 10*3/uL (ref 0.0–0.1)
Basophils Relative: 0 %
Eosinophils Absolute: 0.3 10*3/uL (ref 0.0–0.5)
Eosinophils Relative: 2 %
HCT: 35.3 % — ABNORMAL LOW (ref 36.0–46.0)
Hemoglobin: 11.3 g/dL — ABNORMAL LOW (ref 12.0–15.0)
Immature Granulocytes: 0 %
Lymphocytes Relative: 15 %
Lymphs Abs: 2.1 10*3/uL (ref 0.7–4.0)
MCH: 29.8 pg (ref 26.0–34.0)
MCHC: 32 g/dL (ref 30.0–36.0)
MCV: 93.1 fL (ref 80.0–100.0)
Monocytes Absolute: 1.3 10*3/uL — ABNORMAL HIGH (ref 0.1–1.0)
Monocytes Relative: 10 %
Neutro Abs: 9.8 10*3/uL — ABNORMAL HIGH (ref 1.7–7.7)
Neutrophils Relative %: 73 %
Platelets: 376 10*3/uL (ref 150–400)
RBC: 3.79 MIL/uL — ABNORMAL LOW (ref 3.87–5.11)
RDW: 14.6 % (ref 11.5–15.5)
WBC: 13.5 10*3/uL — ABNORMAL HIGH (ref 4.0–10.5)
nRBC: 0 % (ref 0.0–0.2)

## 2023-04-16 LAB — URINALYSIS, ROUTINE W REFLEX MICROSCOPIC
Bilirubin Urine: NEGATIVE
Glucose, UA: NEGATIVE mg/dL
Hgb urine dipstick: NEGATIVE
Ketones, ur: 80 mg/dL — AB
Leukocytes,Ua: NEGATIVE
Nitrite: NEGATIVE
Protein, ur: 100 mg/dL — AB
Specific Gravity, Urine: 1.012 (ref 1.005–1.030)
pH: 6 (ref 5.0–8.0)

## 2023-04-16 LAB — BRAIN NATRIURETIC PEPTIDE: B Natriuretic Peptide: 135 pg/mL — ABNORMAL HIGH (ref 0.0–100.0)

## 2023-04-16 LAB — RESP PANEL BY RT-PCR (RSV, FLU A&B, COVID)  RVPGX2
Influenza A by PCR: NEGATIVE
Influenza B by PCR: NEGATIVE
Resp Syncytial Virus by PCR: NEGATIVE
SARS Coronavirus 2 by RT PCR: NEGATIVE

## 2023-04-16 LAB — LACTIC ACID, PLASMA: Lactic Acid, Venous: 1.1 mmol/L (ref 0.5–1.9)

## 2023-04-16 LAB — PROCALCITONIN: Procalcitonin: 3.31 ng/mL

## 2023-04-16 MED ORDER — POLYETHYLENE GLYCOL 3350 17 G PO PACK
17.0000 g | PACK | Freq: Every day | ORAL | Status: DC | PRN
  Administered 2023-04-19: 17 g via ORAL
  Filled 2023-04-16: qty 1

## 2023-04-16 MED ORDER — UMECLIDINIUM BROMIDE 62.5 MCG/ACT IN AEPB
1.0000 | INHALATION_SPRAY | Freq: Every day | RESPIRATORY_TRACT | Status: DC
  Administered 2023-04-17 – 2023-04-19 (×3): 1 via RESPIRATORY_TRACT
  Filled 2023-04-16: qty 7

## 2023-04-16 MED ORDER — ONDANSETRON HCL 4 MG/2ML IJ SOLN
4.0000 mg | Freq: Four times a day (QID) | INTRAMUSCULAR | Status: DC | PRN
  Filled 2023-04-16: qty 2

## 2023-04-16 MED ORDER — VANCOMYCIN HCL 1500 MG/300ML IV SOLN: 1500.0000 mg | INTRAVENOUS | Status: DC

## 2023-04-16 MED ORDER — ACETAMINOPHEN 325 MG PO TABS: 650.0000 mg | ORAL_TABLET | Freq: Four times a day (QID) | ORAL | Status: DC | PRN

## 2023-04-16 MED ORDER — METHYLPREDNISOLONE SODIUM SUCC 125 MG IJ SOLR
120.0000 mg | Freq: Every day | INTRAMUSCULAR | Status: DC
  Filled 2023-04-16: qty 2

## 2023-04-16 MED ORDER — IPRATROPIUM-ALBUTEROL 0.5-2.5 (3) MG/3ML IN SOLN
3.0000 mL | Freq: Three times a day (TID) | RESPIRATORY_TRACT | Status: DC
  Administered 2023-04-17: 3 mL via RESPIRATORY_TRACT
  Filled 2023-04-16: qty 3

## 2023-04-16 MED ORDER — SODIUM CHLORIDE 0.9 % IV BOLUS
1000.0000 mL | Freq: Once | INTRAVENOUS | Status: AC
  Administered 2023-04-16: 1000 mL via INTRAVENOUS

## 2023-04-16 MED ORDER — MOMETASONE FURO-FORMOTEROL FUM 100-5 MCG/ACT IN AERO
2.0000 | INHALATION_SPRAY | Freq: Two times a day (BID) | RESPIRATORY_TRACT | Status: DC
  Administered 2023-04-17 – 2023-04-19 (×5): 2 via RESPIRATORY_TRACT
  Filled 2023-04-16: qty 8.8

## 2023-04-16 MED ORDER — ACETAMINOPHEN 650 MG RE SUPP: 650.0000 mg | Freq: Four times a day (QID) | RECTAL | Status: DC | PRN

## 2023-04-16 MED ORDER — ROPINIROLE HCL 1 MG PO TABS
1.0000 mg | ORAL_TABLET | Freq: Three times a day (TID) | ORAL | Status: DC
  Administered 2023-04-16 – 2023-04-19 (×8): 1 mg via ORAL
  Filled 2023-04-16 (×8): qty 1

## 2023-04-16 MED ORDER — GUAIFENESIN-DM 100-10 MG/5ML PO SYRP: 15.0000 mL | ORAL_SOLUTION | Freq: Three times a day (TID) | ORAL | Status: DC | PRN

## 2023-04-16 MED ORDER — METHYLPREDNISOLONE SODIUM SUCC 125 MG IJ SOLR
125.0000 mg | Freq: Once | INTRAMUSCULAR | Status: AC
  Administered 2023-04-16: 125 mg via INTRAVENOUS
  Filled 2023-04-16: qty 2

## 2023-04-16 MED ORDER — SODIUM CHLORIDE 0.9 % IV SOLN: INTRAVENOUS | Status: AC

## 2023-04-16 MED ORDER — INSULIN ASPART 100 UNIT/ML IJ SOLN
0.0000 [IU] | INTRAMUSCULAR | Status: DC
  Administered 2023-04-16 – 2023-04-17 (×4): 2 [IU] via SUBCUTANEOUS
  Administered 2023-04-17: 1 [IU] via SUBCUTANEOUS
  Administered 2023-04-17: 2 [IU] via SUBCUTANEOUS
  Administered 2023-04-17: 3 [IU] via SUBCUTANEOUS
  Administered 2023-04-18 (×2): 2 [IU] via SUBCUTANEOUS
  Administered 2023-04-18: 5 [IU] via SUBCUTANEOUS
  Administered 2023-04-18: 2 [IU] via SUBCUTANEOUS
  Administered 2023-04-18: 5 [IU] via SUBCUTANEOUS
  Administered 2023-04-18: 2 [IU] via SUBCUTANEOUS
  Administered 2023-04-19: 5 [IU] via SUBCUTANEOUS
  Administered 2023-04-19 (×2): 3 [IU] via SUBCUTANEOUS

## 2023-04-16 MED ORDER — SODIUM CHLORIDE 0.9 % IV SOLN
1.0000 g | INTRAVENOUS | Status: DC
  Administered 2023-04-16 – 2023-04-18 (×3): 1 g via INTRAVENOUS
  Filled 2023-04-16 (×3): qty 10

## 2023-04-16 MED ORDER — ENOXAPARIN SODIUM 40 MG/0.4ML IJ SOSY
40.0000 mg | PREFILLED_SYRINGE | INTRAMUSCULAR | Status: DC
  Administered 2023-04-16 – 2023-04-18 (×3): 40 mg via SUBCUTANEOUS
  Filled 2023-04-16 (×3): qty 0.4

## 2023-04-16 MED ORDER — ACETAMINOPHEN 325 MG PO TABS
650.0000 mg | ORAL_TABLET | Freq: Four times a day (QID) | ORAL | Status: DC | PRN
  Administered 2023-04-18 – 2023-04-19 (×5): 650 mg via ORAL
  Filled 2023-04-16 (×5): qty 2

## 2023-04-16 MED ORDER — TETANUS-DIPHTH-ACELL PERTUSSIS 5-2.5-18.5 LF-MCG/0.5 IM SUSY
0.5000 mL | PREFILLED_SYRINGE | Freq: Once | INTRAMUSCULAR | Status: AC
  Administered 2023-04-16: 0.5 mL via INTRAMUSCULAR
  Filled 2023-04-16: qty 0.5

## 2023-04-16 MED ORDER — ONDANSETRON HCL 4 MG PO TABS: 4.0000 mg | ORAL_TABLET | Freq: Four times a day (QID) | ORAL | Status: DC | PRN

## 2023-04-16 MED ORDER — IPRATROPIUM-ALBUTEROL 0.5-2.5 (3) MG/3ML IN SOLN
3.0000 mL | RESPIRATORY_TRACT | Status: AC
  Administered 2023-04-16 (×2): 3 mL via RESPIRATORY_TRACT
  Filled 2023-04-16: qty 6

## 2023-04-16 MED ORDER — METRONIDAZOLE 500 MG/100ML IV SOLN
500.0000 mg | Freq: Two times a day (BID) | INTRAVENOUS | Status: DC
  Administered 2023-04-16: 500 mg via INTRAVENOUS
  Filled 2023-04-16 (×2): qty 100

## 2023-04-16 MED ORDER — IPRATROPIUM-ALBUTEROL 0.5-2.5 (3) MG/3ML IN SOLN
3.0000 mL | RESPIRATORY_TRACT | Status: DC | PRN
  Administered 2023-04-18: 3 mL via RESPIRATORY_TRACT
  Filled 2023-04-16: qty 3

## 2023-04-16 MED ORDER — VANCOMYCIN HCL 2000 MG/400ML IV SOLN
2000.0000 mg | Freq: Once | INTRAVENOUS | Status: AC
  Administered 2023-04-16: 2000 mg via INTRAVENOUS
  Filled 2023-04-16 (×2): qty 400

## 2023-04-16 MED ORDER — BUDESON-GLYCOPYRROL-FORMOTEROL 160-9-4.8 MCG/ACT IN AERO: 2.0000 | INHALATION_SPRAY | Freq: Two times a day (BID) | RESPIRATORY_TRACT | Status: DC
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
## 2023-04-16 NOTE — Assessment & Plan Note (Addendum)
Per spouse- presenting with visual hallucinations, speech change- mumbling, weakness, cannot ambulate.  On my evaluation A&O X 2 only.  No focal neurologic deficits.  Dry mucous membranes, tachycardic to 120s. -Obtain head CT -Hydrate with 1 L LR bolus -Encourage oral intake - N/s 50cc/hr x 10hrs

## 2023-04-16 NOTE — Progress Notes (Signed)
Pharmacy Antibiotic Note  [name] is a 67 y.o. female admitted on 04/16/2023 with COPD exacerbation and concerns for sepsis.  Pharmacy has been consulted for vancomycin dosing.  WBC 13.5, sCr 0.58, lactate 1.1, afebrile, and blood cultures collected with urine culture ordered Give 1 dose of ceftriaxone in ED  Plan: Ceftriaxone 1g IV every 24 hours per MD Vancomycin 2g IV x1 Vancomycin 1500mg  IV every 24 hours (AUC 437, Vd 0.72, IBW) Monitor renal function Follow up signs of clinical improvement, LOT, de-escalation of antibiotics  Height: 5\' 4"  (162.6 cm) Weight: 80 kg (176 lb 5.9 oz) IBW/kg (Calculated) : 54.7  Temp (24hrs), Avg:99.1 F (37.3 C), Min:97.7 F (36.5 C), Max:99.9 F (37.7 C)  Recent Labs  Lab 04/16/23 1500 04/16/23 2104  WBC 13.5*  --   CREATININE 0.58  --   LATICACIDVEN  --  1.1    Estimated Creatinine Clearance: 69.8 mL/min (by C-G formula based on SCr of 0.58 mg/dL).    Allergies  Allergen Reactions   Septra [Sulfamethoxazole-Trimethoprim] Other (See Comments)    Patient is unsure if she allergic to septra or penicillin. Patient states one or another caused "rib pain with a little breathing problem".   Penicillins Other (See Comments)    Patient is unsure if she allergic to penicillin or septra. Patient states one or another caused "rib pain with a little breathing problem". Tolerates cephalosporins    Antimicrobials this admission: Ceftriaxone 10/12 >> Vancomycin 10/12 >>   Microbiology results: 10/12 BCx:  10/12 UCx: not collected   Thank you for allowing pharmacy to be a part of this patient's care.  [name], PharmD. Clinical Pharmacist 04/16/2023 10:18 PM

## 2023-04-16 NOTE — ED Notes (Signed)
Dr. Eloise Harman at [facility]. Pt oxygen saturation dropped 85% on 2L. Oxygen increased to 4L and oxygen saturation increased to 90%. New orders received.
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
## 2023-04-16 NOTE — Assessment & Plan Note (Addendum)
Diffuse expiratory wheezing,, with altered mental status.  On chronic 4 L O2.  Sats currently 91 to 97% on 4 L HFNC.  Chest x-ray showing diffuse bronchitis/reactive airway disease.  COVID test negative. -125 mg Solu-Medrol given, continue 60 twice daily -Nebs as needed and scheduled -Mucolytics - IV ceftriazone

## 2023-04-16 NOTE — Assessment & Plan Note (Signed)
Stable. -Resume losartan -Hold torsemide

## 2023-04-16 NOTE — Assessment & Plan Note (Addendum)
-  Based home to demand 4 L -At baseline, still having mild shortness of breath but satting 97% -Urine DuoNeb bronchodilators
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
## 2023-04-16 NOTE — Assessment & Plan Note (Signed)
Controlled. - SSI- S -Hold metformin - HgbA1c

## 2023-04-16 NOTE — Assessment & Plan Note (Addendum)
Meeting SIRS criteria with tachycardia, heart rate 98-120, tachypnea respirate rate 19-26, leukocytosis 13.5, Tmax 99.9.  With altered mental status.  Chest x-ray showing diffuse bronchitis, no active infection.  COVID test negative.  Tachycardia may be secondary to dehydration, and continuous nebs given in ED. procalcitonin quite elevated at 3.3. -Obtain blood cultures -Obtain lactic acid -Obtain UA, urine cultures -Will start IV ceftriaxone vancomycin and metronidazole

## 2023-04-16 NOTE — Assessment & Plan Note (Signed)
Resume Cymbalta, olanzapine

## 2023-04-16 NOTE — Consult Note (Signed)
PHARMACIST - PHYSICIAN COMMUNICATION   CONCERNING: Methylprednisolone IV    Current order: Methylprednisolone IV 60 mg Q12H     DESCRIPTION: Per [place] Protocol:   IV methylprednisolone will be converted to either a q12h or q24h frequency with the same total daily dose (TDD).  Ordered Dose: 1 to 125 mg TDD; convert to: TDD q24h.  Ordered Dose: 126 to 250 mg TDD; convert to: TDD div q12h.  Ordered Dose: >250 mg TDD; DAW.  Order has been adjusted to: Methylprednisolone IV 120 mg Q24H  Celene Squibb, PharmD Clinical Pharmacist 04/16/2023 9:26 PM

## 2023-04-16 NOTE — Assessment & Plan Note (Signed)
Stable, Last echo 09/2022, EF 65 to 70%.  Patient appears dehydrated. -Hold torsemide, -Monitoring IV fluid hydration to avoid volume overload

## 2023-04-17 DIAGNOSIS — J441 Chronic obstructive pulmonary disease with (acute) exacerbation: Secondary | ICD-10-CM

## 2023-04-17 LAB — BASIC METABOLIC PANEL
Anion gap: 16 — ABNORMAL HIGH (ref 5–15)
BUN: 16 mg/dL (ref 8–23)
CO2: 25 mmol/L (ref 22–32)
Calcium: 10.9 mg/dL — ABNORMAL HIGH (ref 8.9–10.3)
Chloride: 96 mmol/L — ABNORMAL LOW (ref 98–111)
Creatinine, Ser: 0.67 mg/dL (ref 0.44–1.00)
GFR, Estimated: >60 mL/min (ref >60)
Glucose, Bld: 153 mg/dL — ABNORMAL HIGH (ref 70–99)
Potassium: 3.2 mmol/L — ABNORMAL LOW (ref 3.5–5.1)
Sodium: 137 mmol/L (ref 135–145)

## 2023-04-17 LAB — CBC
HCT: 34.2 % — ABNORMAL LOW (ref 36.0–46.0)
Hemoglobin: 10.6 g/dL — ABNORMAL LOW (ref 12.0–15.0)
MCH: 29 pg (ref 26.0–34.0)
MCHC: 31 g/dL (ref 30.0–36.0)
MCV: 93.7 fL (ref 80.0–100.0)
Platelets: 358 10*3/uL (ref 150–400)
RBC: 3.65 MIL/uL — ABNORMAL LOW (ref 3.87–5.11)
RDW: 14.5 % (ref 11.5–15.5)
WBC: 9.2 10*3/uL (ref 4.0–10.5)
nRBC: 0 % (ref 0.0–0.2)

## 2023-04-17 LAB — GLUCOSE, CAPILLARY
Glucose-Capillary: 168 mg/dL — ABNORMAL HIGH (ref 70–99)
Glucose-Capillary: 147 mg/dL — ABNORMAL HIGH (ref 70–99)
Glucose-Capillary: 156 mg/dL — ABNORMAL HIGH (ref 70–99)
Glucose-Capillary: 177 mg/dL — ABNORMAL HIGH (ref 70–99)
Glucose-Capillary: 233 mg/dL — ABNORMAL HIGH (ref 70–99)

## 2023-04-17 LAB — HEMOGLOBIN A1C
Hgb A1c MFr Bld: 6.3 % — ABNORMAL HIGH (ref 4.8–5.6)
Mean Plasma Glucose: 134.11 mg/dL

## 2023-04-17 LAB — HIV ANTIBODY (ROUTINE TESTING W REFLEX): HIV Screen 4th Generation wRfx: NONREACTIVE

## 2023-04-17 MED ORDER — RISAQUAD PO CAPS
2.0000 | ORAL_CAPSULE | Freq: Three times a day (TID) | ORAL | Status: DC
  Administered 2023-04-17 – 2023-04-19 (×7): 2 via ORAL
  Filled 2023-04-17 (×7): qty 2

## 2023-04-17 MED ORDER — POTASSIUM CHLORIDE CRYS ER 20 MEQ PO TBCR
40.0000 meq | EXTENDED_RELEASE_TABLET | Freq: Once | ORAL | Status: AC
  Administered 2023-04-17: 40 meq via ORAL
  Filled 2023-04-17: qty 2

## 2023-04-17 MED ORDER — METHYLPREDNISOLONE SODIUM SUCC 125 MG IJ SOLR
125.0000 mg | Freq: Two times a day (BID) | INTRAMUSCULAR | Status: DC
Start: 2023-04-17 — End: 2023-04-17

## 2023-04-17 MED ORDER — SODIUM CHLORIDE 0.9 % IV SOLN: INTRAVENOUS | Status: AC

## 2023-04-17 MED ORDER — GUAIFENESIN-DM 100-10 MG/5ML PO SYRP
10.0000 mL | ORAL_SOLUTION | Freq: Three times a day (TID) | ORAL | Status: DC
  Administered 2023-04-17 – 2023-04-19 (×7): 10 mL via ORAL
  Filled 2023-04-17 (×7): qty 10

## 2023-04-17 MED ORDER — METHYLPREDNISOLONE SODIUM SUCC 40 MG IJ SOLR
40.0000 mg | Freq: Two times a day (BID) | INTRAMUSCULAR | Status: DC
  Administered 2023-04-17 (×2): 40 mg via INTRAVENOUS
  Filled 2023-04-17 (×3): qty 1

## 2023-04-17 MED ORDER — MAGNESIUM SULFATE 2 GM/50ML IV SOLN
2.0000 g | Freq: Once | INTRAVENOUS | Status: AC
  Administered 2023-04-17: 2 g via INTRAVENOUS
  Filled 2023-04-17: qty 50

## 2023-04-17 MED ORDER — IPRATROPIUM-ALBUTEROL 0.5-2.5 (3) MG/3ML IN SOLN
3.0000 mL | Freq: Three times a day (TID) | RESPIRATORY_TRACT | Status: DC
  Administered 2023-04-17 – 2023-04-19 (×7): 3 mL via RESPIRATORY_TRACT
  Filled 2023-04-17 (×7): qty 3

## 2023-04-17 NOTE — Progress Notes (Signed)
   04/17/23 0935  Assess: MEWS Score  Temp 98.7 F (37.1 C)  BP (!) 147/91  MAP (mmHg) 107  Pulse Rate (!) 116  Resp 20  SpO2 95 %  O2 Device Nasal Cannula  Assess: MEWS Score  MEWS Temp 0  MEWS Systolic 0  MEWS Pulse 2  MEWS RR 0  MEWS LOC 0  MEWS Score 2  MEWS Score Color Yellow  Assess: if the MEWS score is Yellow or Red  Were vital signs accurate and taken at a resting state? Yes  Does the patient meet 2 or more of the SIRS criteria? No  MEWS guidelines implemented  No, previously yellow, continue vital signs every 4 hours  Notify: Charge Nurse/RN  Name of Charge Nurse/RN Notified Lawanna Kobus, RN  Provider Notification  Provider Name/Title [email or web address]  Date Provider Notified 04/17/23  Time Provider Notified 1010  Method of Notification Page  Assess: SIRS CRITERIA  SIRS Temperature  0  SIRS Pulse 1  SIRS Respirations  0  SIRS WBC 0  SIRS Score Sum  1

## 2023-04-17 NOTE — Plan of Care (Signed)
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
## 2023-04-17 NOTE — Progress Notes (Signed)
   04/17/23 0846  TOC Brief Assessment  Insurance and Status Reviewed  Patient has primary care physician Yes  Home environment has been reviewed From home with spouse  Prior level of function: Independent.  Prior/Current Home Services Current home services (Pt has home 02.)  Social Determinants of Health Reivew SDOH reviewed no interventions necessary  Readmission risk has been reviewed Yes  Transition of care needs no transition of care needs at this time      Transition of Care Department [facility]) has reviewed patient and no TOC needs have been identified at this time. We will continue to monitor patient advancement through interdisciplinary progression rounds. If new patient transition needs arise, please place a TOC consult.
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
## 2023-04-18 DIAGNOSIS — T1490XA Injury, unspecified, initial encounter: Secondary | ICD-10-CM | POA: Diagnosis present

## 2023-04-18 DIAGNOSIS — J441 Chronic obstructive pulmonary disease with (acute) exacerbation: Secondary | ICD-10-CM | POA: Diagnosis not present

## 2023-04-18 LAB — GLUCOSE, CAPILLARY
Glucose-Capillary: 162 mg/dL — ABNORMAL HIGH (ref 70–99)
Glucose-Capillary: 163 mg/dL — ABNORMAL HIGH (ref 70–99)
Glucose-Capillary: 191 mg/dL — ABNORMAL HIGH (ref 70–99)
Glucose-Capillary: 197 mg/dL — ABNORMAL HIGH (ref 70–99)
Glucose-Capillary: 200 mg/dL — ABNORMAL HIGH (ref 70–99)
Glucose-Capillary: 255 mg/dL — ABNORMAL HIGH (ref 70–99)

## 2023-04-18 LAB — CBC
HCT: 31.7 % — ABNORMAL LOW (ref 36.0–46.0)
Hemoglobin: 10.1 g/dL — ABNORMAL LOW (ref 12.0–15.0)
MCH: 29.6 pg (ref 26.0–34.0)
MCHC: 31.9 g/dL (ref 30.0–36.0)
MCV: 93 fL (ref 80.0–100.0)
Platelets: 370 10*3/uL (ref 150–400)
RBC: 3.41 MIL/uL — ABNORMAL LOW (ref 3.87–5.11)
RDW: 14.8 % (ref 11.5–15.5)
WBC: 17 10*3/uL — ABNORMAL HIGH (ref 4.0–10.5)
nRBC: 0 % (ref 0.0–0.2)

## 2023-04-18 LAB — BASIC METABOLIC PANEL
Anion gap: 7 (ref 5–15)
BUN: 17 mg/dL (ref 8–23)
CO2: 30 mmol/L (ref 22–32)
Calcium: 11.1 mg/dL — ABNORMAL HIGH (ref 8.9–10.3)
Chloride: 101 mmol/L (ref 98–111)
Creatinine, Ser: 0.52 mg/dL (ref 0.44–1.00)
GFR, Estimated: >60 mL/min (ref >60)
Glucose, Bld: 169 mg/dL — ABNORMAL HIGH (ref 70–99)
Potassium: 3.7 mmol/L (ref 3.5–5.1)
Sodium: 138 mmol/L (ref 135–145)

## 2023-04-18 LAB — PROCALCITONIN: Procalcitonin: 0.84 ng/mL

## 2023-04-18 MED ORDER — METHYLPREDNISOLONE SODIUM SUCC 40 MG IJ SOLR
40.0000 mg | INTRAMUSCULAR | Status: DC
  Administered 2023-04-18: 40 mg via INTRAVENOUS
  Filled 2023-04-18: qty 1

## 2023-04-18 NOTE — Care Management Important Message (Signed)
Important Message  Patient Details  Name: [name] MRN: [id number] Date of Birth: 1955-12-10   Important Message Given:  N/A - LOS <3 / Initial given by admissions     Corey Harold 04/18/2023, 1:48 PM
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
## 2023-04-18 NOTE — Progress Notes (Signed)
Mobility Specialist Progress Note:    04/18/23 1115  Mobility  Activity Ambulated with assistance in hallway  Level of Assistance Standby assist, set-up cues, supervision of patient - no hands on  Assistive Device Front wheel walker  Distance Ambulated (ft) 50 ft  Range of Motion/Exercises Active;All extremities  Activity Response Tolerated well  Mobility Referral Yes  $Mobility charge 1 Mobility  Mobility Specialist Start Time (ACUTE ONLY) 1115  Mobility Specialist Stop Time (ACUTE ONLY) 1135  Mobility Specialist Time Calculation (min) (ACUTE ONLY) 20 min   Pt received in bed, family in room. Agreeable to mobility, required SBA to stand and ambulate with RW. Tolerated well, baseline SPO2 97% on 4L at rest. SpO2 88% on 4L during ambulation. Took a standing rest break, recovered SpO2 93% on 4L. Returned pt to room, SpO2 95% on 4L sitting EOB. Returned pt supine, bed alarm on. Call bell in reach, all needs met.   Lawerance Bach Mobility Specialist Please contact via Special educational needs teacher or  Rehab office at [phone]
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
## 2023-04-19 ENCOUNTER — Encounter (HOSPITAL_COMMUNITY): Payer: Self-pay | Admitting: Student

## 2023-04-19 ENCOUNTER — Ambulatory Visit: Payer: 59 | Admitting: Podiatry

## 2023-04-19 ENCOUNTER — Telehealth (INDEPENDENT_AMBULATORY_CARE_PROVIDER_SITE_OTHER): Payer: Self-pay | Admitting: Otolaryngology

## 2023-04-19 ENCOUNTER — Ambulatory Visit (INDEPENDENT_AMBULATORY_CARE_PROVIDER_SITE_OTHER): Payer: 59

## 2023-04-19 DIAGNOSIS — J441 Chronic obstructive pulmonary disease with (acute) exacerbation: Secondary | ICD-10-CM | POA: Diagnosis not present

## 2023-04-19 LAB — CBC
HCT: 29.9 % — ABNORMAL LOW (ref 36.0–46.0)
Hemoglobin: 9.5 g/dL — ABNORMAL LOW (ref 12.0–15.0)
MCH: 29.7 pg (ref 26.0–34.0)
MCHC: 31.8 g/dL (ref 30.0–36.0)
MCV: 93.4 fL (ref 80.0–100.0)
Platelets: 333 10*3/uL (ref 150–400)
RBC: 3.2 MIL/uL — ABNORMAL LOW (ref 3.87–5.11)
RDW: 15.1 % (ref 11.5–15.5)
WBC: 11.1 10*3/uL — ABNORMAL HIGH (ref 4.0–10.5)
nRBC: 0 % (ref 0.0–0.2)

## 2023-04-19 LAB — PROCALCITONIN: Procalcitonin: 0.36 ng/mL

## 2023-04-19 LAB — GLUCOSE, CAPILLARY
Glucose-Capillary: 129 mg/dL — ABNORMAL HIGH (ref 70–99)
Glucose-Capillary: 209 mg/dL — ABNORMAL HIGH (ref 70–99)
Glucose-Capillary: 213 mg/dL — ABNORMAL HIGH (ref 70–99)
Glucose-Capillary: 278 mg/dL — ABNORMAL HIGH (ref 70–99)

## 2023-04-19 LAB — BASIC METABOLIC PANEL
Anion gap: 8 (ref 5–15)
BUN: 14 mg/dL (ref 8–23)
CO2: 31 mmol/L (ref 22–32)
Calcium: 11.2 mg/dL — ABNORMAL HIGH (ref 8.9–10.3)
Chloride: 97 mmol/L — ABNORMAL LOW (ref 98–111)
Creatinine, Ser: 0.46 mg/dL (ref 0.44–1.00)
GFR, Estimated: >60 mL/min (ref >60)
Glucose, Bld: 233 mg/dL — ABNORMAL HIGH (ref 70–99)
Potassium: 3.3 mmol/L — ABNORMAL LOW (ref 3.5–5.1)
Sodium: 136 mmol/L (ref 135–145)

## 2023-04-19 LAB — URINE CULTURE: Culture: 30000 — AB

## 2023-04-19 MED ORDER — FAMOTIDINE 20 MG PO TABS: ORAL_TABLET | ORAL | 11 refills | Status: DC

## 2023-04-19 MED ORDER — AMLODIPINE BESYLATE 5 MG PO TABS
10.0000 mg | ORAL_TABLET | Freq: Every day | ORAL | Status: DC
  Administered 2023-04-19: 10 mg via ORAL
  Filled 2023-04-19: qty 2

## 2023-04-19 MED ORDER — GUAIFENESIN-DM 100-10 MG/5ML PO SYRP: 10.0000 mL | ORAL_SOLUTION | Freq: Three times a day (TID) | ORAL | 0 refills | Status: DC

## 2023-04-19 MED ORDER — AMLODIPINE BESYLATE 10 MG PO TABS: 10.0000 mg | ORAL_TABLET | Freq: Every day | ORAL | 1 refills | Status: DC

## 2023-04-19 MED ORDER — METHYLPREDNISOLONE 4 MG PO TBPK: ORAL_TABLET | ORAL | 0 refills | Status: DC

## 2023-04-19 MED ORDER — LEVOFLOXACIN 750 MG PO TABS: 750.0000 mg | ORAL_TABLET | Freq: Every day | ORAL | 0 refills | Status: AC
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
## 2023-04-19 NOTE — Telephone Encounter (Signed)
Called patient and left voicemail to reschedule appointment. Dr.Teoh does not treat mouth or throat issues for adults.

## 2023-04-19 NOTE — Progress Notes (Signed)
Mobility Specialist Progress Note:    04/19/23 1020  Mobility  Activity Ambulated with assistance in hallway  Level of Assistance Contact guard assist, steadying assist  Assistive Device Front wheel walker  Distance Ambulated (ft) 140 ft  Range of Motion/Exercises Active;All extremities  Activity Response Tolerated well  Mobility Referral Yes  $Mobility charge 1 Mobility  Mobility Specialist Start Time (ACUTE ONLY) 1020  Mobility Specialist Stop Time (ACUTE ONLY) 1040  Mobility Specialist Time Calculation (min) (ACUTE ONLY) 20 min   Pt received in bed, family in room. Agreeable to mobility, required CGA to stand and ambulate with RW. Tolerated well, baseline SpO2 95% on 3L at rest. Spo2 91% on 3L during ambulation. Returned pt to room, SpO2 96% on 3L after session. Left pt supine, bed alarm on. Call bell in reach, all needs met.   Lawerance Bach Mobility Specialist Please contact via Special educational needs teacher or  Rehab office at [phone]

## 2023-04-21 LAB — CULTURE, BLOOD (ROUTINE X 2)
Culture: NO GROWTH
Culture: NO GROWTH

## 2023-04-25 ENCOUNTER — Inpatient Hospital Stay (HOSPITAL_COMMUNITY): Admission: RE | Admit: 2023-04-25 | Payer: 59 | Source: Ambulatory Visit

## 2023-04-27 ENCOUNTER — Encounter (INDEPENDENT_AMBULATORY_CARE_PROVIDER_SITE_OTHER): Payer: Self-pay | Admitting: Otolaryngology

## 2023-05-02 ENCOUNTER — Ambulatory Visit (INDEPENDENT_AMBULATORY_CARE_PROVIDER_SITE_OTHER): Payer: 59 | Admitting: Podiatry

## 2023-05-02 ENCOUNTER — Telehealth: Payer: Self-pay | Admitting: Internal Medicine

## 2023-05-02 DIAGNOSIS — B351 Tinea unguium: Secondary | ICD-10-CM

## 2023-05-02 DIAGNOSIS — M79674 Pain in right toe(s): Secondary | ICD-10-CM

## 2023-05-02 DIAGNOSIS — L853 Xerosis cutis: Secondary | ICD-10-CM

## 2023-05-02 MED ORDER — AMMONIUM LACTATE 12 % EX CREA: 1.0000 | TOPICAL_CREAM | CUTANEOUS | 0 refills | Status: DC | PRN

## 2023-05-02 NOTE — Telephone Encounter (Signed)
Patent states he breathing has gotten "really bad" and she would like for Dr. Sherene Sires to call her in an antibiotic and a steroid.  Please call this into [place]  [phone] and let know when it is called in [phone]

## 2023-05-02 NOTE — Telephone Encounter (Signed)
Called number provided  and there was no answer and no option to leave msg  Called her mobile number and also no answer- VM was full, so unable to leave her a msg  Will call back

## 2023-05-02 NOTE — Progress Notes (Signed)
Subjective:  Patient ID: [name], female    DOB: 1955-12-26,  MRN: [id number]  [name] presents to clinic today for:  Chief Complaint  Patient presents with   Nail Problem    [facility].   Patient notes nails are thick, discolored, elongated and painful in shoegear when trying to ambulate.  She is currently being treated for lung cancer of the radiation treatment.  She is on oxygen.  A family member is with her today.  She also notes that she tends to get pretty dry skin and her legs will swell more as the day goes on.  As she does not wear any compression stockings.  PCP is Nsumanganyi, Colleen Can, NP.  Past Medical History:  Diagnosis Date   Anemia    Anxiety    Arthritis    Bipolar 1 disorder (HCC)    CHF (congestive heart failure) (HCC)    COPD (chronic obstructive pulmonary disease) (HCC)    Depression    Diabetes mellitus without complication (HCC)    Dyspnea    Emphysema of lung (HCC)    GERD (gastroesophageal reflux disease)    Headache    migraines   History of hiatal hernia    History of kidney stones    Hyperlipidemia    Hypertension    Neuromuscular disorder (HCC)    neuropathy   On home oxygen therapy    Pneumonia 2015, 2019   Tracheomalacia    Vaginal Pap smear, abnormal     Past Surgical History:  Procedure Laterality Date   CATARACT EXTRACTION W/PHACO Left 01/14/2020   Procedure: CATARACT EXTRACTION PHACO AND INTRAOCULAR LENS PLACEMENT LEFT EYE;  Surgeon: Fabio Pierce, MD;  Location: [facility];  Service: Ophthalmology;  Laterality: Left;  CDE: 8.18   CATARACT EXTRACTION W/PHACO Right 02/01/2020   Procedure: CATARACT EXTRACTION PHACO AND INTRAOCULAR LENS PLACEMENT RIGHT EYE;  Surgeon: Fabio Pierce, MD;  Location: [facility];  Service: Ophthalmology;  Laterality: Right;  CDE: 9.94   CHEILECTOMY Right 01/28/2020   Procedure: KELLER BUNION IMPLANT RIGHT FOOT CHEILECTOMY RIGHT ROOT;  Surgeon: Asencion Islam, DPM;  Location: [facility];   Service: Podiatry;  Laterality: Right;  BLOCK   CHOLECYSTECTOMY     COLONOSCOPY  July 2010   Dr. Lovell Sheehan: 3 rectal polyps, not enough tissue for pathologic examination, recommended surveillance in 3 years   COLONOSCOPY N/A 10/23/2014   RMR: Multiple colonic polyps removed as described above. No endoscopic explaniation for abdominal pain. however. next tcs 10/2019   ESOPHAGOGASTRODUODENOSCOPY  July 2010   Dr. Lovell Sheehan: gastritis and duodenitis, H.pylori negative   ESOPHAGOGASTRODUODENOSCOPY N/A 10/23/2014   RMR: Normal EGD. Status post passage of a Maloney dilator. Today's finding s would not explain abdominal pain   EYE SURGERY Left 01/14/2020   cataract removal   GANGLION CYST EXCISION Left 09/07/2018   Procedure: REMOVAL GANGLION OF WRIST;  Surgeon: Vickki Hearing, MD;  Location: [facility];  Service: Orthopedics;  Laterality: Left;   HERNIA REPAIR     Dr. Lovell Sheehan   KIDNEY STONE SURGERY     MALONEY DILATION N/A 10/23/2014   Procedure: Elease Hashimoto DILATION;  Surgeon: Corbin Ade, MD;  Location: [facility];  Service: Endoscopy;  Laterality: N/A;   OPEN REDUCTION INTERNAL FIXATION (ORIF) DISTAL RADIAL FRACTURE Right 12/09/2015   Procedure: OPEN REDUCTION INTERNAL FIXATION (ORIF) RIGHT DISTAL RADIUS;  Surgeon: Betha Loa, MD;  Location: [facility];  Service: Orthopedics;  Laterality: Right;  Allergies  Allergen Reactions   Septra [Sulfamethoxazole-Trimethoprim] Other (See Comments)    Patient is unsure if she allergic to septra or penicillin. Patient states one or another caused "rib pain with a little breathing problem".   Penicillins Other (See Comments)    Patient is unsure if she allergic to penicillin or septra. Patient states one or another caused "rib pain with a little breathing problem". Tolerates cephalosporins    Review of Systems: Negative except as noted in the HPI.  Objective:  [name] is a pleasant 67 y.o. female in NAD. AAO x 3.  Vascular  Examination: Capillary refill time is 3-5 seconds to toes bilateral.  Trace palpable pedal pulses b/l LE. Digital hair sparse b/l.  Skin temperature gradient WNL b/l. No varicosities b/l. No cyanosis noted b/l.  Mild edema bilateral lower legs and ankles  Dermatological Examination: Pedal skin with decreased turgor, texture and tone b/l. No open wounds. No interdigital macerations b/l. Toenails x10 are 3mm thick, discolored, dystrophic with subungual debris. There is pain with compression of the nail plates.  They are elongated x10.  Skin is dry and flaky bilateral feet and lower legs      Latest Ref Rng & Units 04/17/2023    4:51 AM 09/23/2022   11:13 AM 05/25/2022    9:32 AM  Hemoglobin A1C  Hemoglobin-A1c 4.8 - 5.6 % 6.3  5.9  7.0    Assessment/Plan: 1. Pain due to onychomycosis of toenail of right foot   2. Xerosis of skin     Meds ordered this encounter  Medications   ammonium lactate (AMLACTIN) 12 % cream    Sig: Apply 1 Application topically as needed for dry skin.    Dispense:  385 g    Refill:  0   The mycotic toenails were sharply debrided x10 with sterile nail nippers and a power debriding burr to decrease bulk/thickness and length.    For the patient's skin prescription for Lac-Hydrin was sent to her pharmacy.  She will apply this twice daily to help manage moisture levels in the epidermis.  Return in about 3 months (around 08/02/2023) for [facility].   Clerance Lav, DPM, FACFAS [facility]     [address] [place], [place] [address]                Office [phone]  Fax [phone]

## 2023-05-03 NOTE — Telephone Encounter (Signed)
Spoke with the pt  She c/o cough with yellow to green sputum, wheezing and increased SOB over the past 3 days  Denies any fevers, aches She is taking her breztri, albuterol inhaler, combivent, protonix  She was recently d/c from the hospital after COPD flare She is asking for abx and pred  Next appt scheduled with Dr Sherene Sires for 05/17/23 and there is nothing sooner to offer  Please advise, thanks!  Allergies  Allergen Reactions   Septra [Sulfamethoxazole-Trimethoprim] Other (See Comments)    Patient is unsure if she allergic to septra or penicillin. Patient states one or another caused "rib pain with a little breathing problem".   Penicillins Other (See Comments)    Patient is unsure if she allergic to penicillin or septra. Patient states one or another caused "rib pain with a little breathing problem". Tolerates cephalosporins

## 2023-05-03 NOTE — Telephone Encounter (Signed)
Omnicef 300 mg bid x 10 d (aware of pcn allergy but risk is low for crossover )   Prednisone 10 mg take  4 each am x 2 days,   2 each am x 2 days,  1 each am x 2 days and stop   Ov next avail with all meds in hand or won't be able to continue phone care

## 2023-05-03 NOTE — Telephone Encounter (Signed)
Called the pt at both numbers listed  No answer on either one, mailbox full   Not sending rx until we speak with her

## 2023-05-05 NOTE — Telephone Encounter (Signed)
Called pt no answer, unable to leave vmm

## 2023-05-06 MED ORDER — PREDNISONE 10 MG PO TABS: 10.0000 mg | ORAL_TABLET | Freq: Every day | ORAL | 0 refills | Status: DC

## 2023-05-06 MED ORDER — CEFDINIR 300 MG PO CAPS: 300.0000 mg | ORAL_CAPSULE | Freq: Two times a day (BID) | ORAL | 0 refills | Status: DC

## 2023-05-06 NOTE — Telephone Encounter (Signed)
Called pt again and still no answer and her VM box is still full  I went ahead and sent rxs to her preferred pharm  Closing encounter

## 2023-05-09 ENCOUNTER — Ambulatory Visit (HOSPITAL_COMMUNITY): Payer: 59 | Attending: Speech Pathology | Admitting: Speech Pathology

## 2023-05-09 ENCOUNTER — Other Ambulatory Visit (HOSPITAL_COMMUNITY): Payer: 59

## 2023-05-09 ENCOUNTER — Other Ambulatory Visit: Payer: Self-pay | Admitting: Pulmonary Disease

## 2023-05-09 ENCOUNTER — Telehealth (HOSPITAL_COMMUNITY): Payer: Self-pay | Admitting: Speech Pathology

## 2023-05-09 NOTE — Telephone Encounter (Signed)
Telephone Call:  Pt did not show for her MBSS today. SLP called the phone number listed, however her voicemail box is full and no message left.   Thank you,  Havery Moros, CCC-SLP [phone]

## 2023-05-10 ENCOUNTER — Telehealth: Payer: Self-pay | Admitting: Cardiology

## 2023-05-10 NOTE — Telephone Encounter (Signed)
NSTEMI was 01/2022  Per 03/30/23  call, pt was to stop plavix  I spoke with husband and he verbalized understanding that [name] will stop plavix.

## 2023-05-10 NOTE — Telephone Encounter (Signed)
Pt c/o medication issue:  1. Name of Medication:  Plavix Benicar - BP med   2. How are you currently taking this medication (dosage and times per day)?   3. Are you having a reaction (difficulty breathing--STAT)?   4. What is your medication issue? Pts husband Dannielle Huh stating that a doctor recently discontinued pts plavix and benicar (spelling?). While hospitalized on 04/16/23, [facility] started pt back on amlodipine. Pts husband is wondering if the plavix should have been discontinued and if she should start back on the medication. Please advise.

## 2023-05-12 ENCOUNTER — Telehealth: Payer: Self-pay | Admitting: Cardiology

## 2023-05-12 NOTE — Telephone Encounter (Signed)
Spoke to pt's husband (DPR) who stated that pt is having swelling in her feet/ankles. Pt's husband denies weight gain (current weight: 271 lb), sob (has COPD), or cp at this time. Pt's husband stated that pt took Torsemide 20 mg tablets x3 yesterday and x1 this morning with no relief. Pt's husband stated that when you press a finger on the top of pt's foot, the indention stays.   Please advise.

## 2023-05-12 NOTE — Telephone Encounter (Signed)
Pt c/o swelling/edema: STAT if pt has developed SOB within 24 hours  If swelling, where is the swelling located? Feet and ankles  How much weight have you gained and in what time span? Husband states she hasn't gained any weight.   Have you gained 2 pounds in a day or 5 pounds in a week?   Do you have a log of your daily weights (if so, list)? no  Are you currently taking a fluid pill? yes  Are you currently SOB? Husband states she has COPD  Have you traveled recently in a car or plane for an extended period of time? No   Patient's spouse states she was on lasix in the past and is currently on torsemide.

## 2023-05-12 NOTE — Telephone Encounter (Signed)
    Please verify her current weight as this was at 177 lbs in 03/2023. Anticipate this was a typo or he confused the numbers but would verify. If the swelling is only along her feet/ankles, would try to elevate her lower extremities and use compression stockings. Limit sodium intake. If swelling started to go up along her lower leg (pitting along her shin bone), then would take extra Torsemide. If persistent symptoms, would check labs (BNP and BMET).   Signed, Ellsworth Lennox, PA-C 05/12/2023, 9:53 AM

## 2023-05-12 NOTE — Telephone Encounter (Signed)
Weight Typo: actual weight 171 lb  Patients husband notified of provider recommendations and verbalized understanding/agreement.  Pt's husband had no further questions or concerns at this time. Pt's husband will call office back if symptoms/swelling persist or worsens.

## 2023-05-13 ENCOUNTER — Other Ambulatory Visit: Payer: Self-pay

## 2023-05-13 MED ORDER — COMBIVENT RESPIMAT 20-100 MCG/ACT IN AERS: 1.0000 | INHALATION_SPRAY | Freq: Four times a day (QID) | RESPIRATORY_TRACT | 5 refills | Status: DC | PRN

## 2023-05-13 NOTE — Addendum Note (Signed)
Addended by: Shelby Dubin on: 05/13/2023 10:49 AM   Modules accepted: Orders

## 2023-05-17 ENCOUNTER — Encounter: Payer: Self-pay | Admitting: Internal Medicine

## 2023-05-17 ENCOUNTER — Ambulatory Visit: Payer: 59 | Admitting: Internal Medicine

## 2023-05-26 ENCOUNTER — Inpatient Hospital Stay (HOSPITAL_COMMUNITY)
Admission: EM | Admit: 2023-05-26 | Discharge: 2023-06-02 | DRG: 189 | Disposition: A | Discharge status: Home Health | Payer: 59 | Attending: Internal Medicine | Admitting: Internal Medicine

## 2023-05-26 ENCOUNTER — Encounter (HOSPITAL_COMMUNITY): Payer: Self-pay

## 2023-05-26 ENCOUNTER — Other Ambulatory Visit: Payer: Self-pay

## 2023-05-26 ENCOUNTER — Emergency Department (HOSPITAL_COMMUNITY): Payer: 59

## 2023-05-26 DIAGNOSIS — G2581 Restless legs syndrome: Secondary | ICD-10-CM | POA: Diagnosis present

## 2023-05-26 DIAGNOSIS — Z882 Allergy status to sulfonamides status: Secondary | ICD-10-CM

## 2023-05-26 DIAGNOSIS — I110 Hypertensive heart disease with heart failure: Secondary | ICD-10-CM | POA: Diagnosis present

## 2023-05-26 DIAGNOSIS — J849 Interstitial pulmonary disease, unspecified: Secondary | ICD-10-CM | POA: Diagnosis present

## 2023-05-26 DIAGNOSIS — E669 Obesity, unspecified: Secondary | ICD-10-CM | POA: Diagnosis present

## 2023-05-26 DIAGNOSIS — I5042 Chronic combined systolic (congestive) and diastolic (congestive) heart failure: Secondary | ICD-10-CM | POA: Diagnosis present

## 2023-05-26 DIAGNOSIS — J9622 Acute and chronic respiratory failure with hypercapnia: Secondary | ICD-10-CM | POA: Diagnosis present

## 2023-05-26 DIAGNOSIS — R0603 Acute respiratory distress: Secondary | ICD-10-CM

## 2023-05-26 DIAGNOSIS — E119 Type 2 diabetes mellitus without complications: Secondary | ICD-10-CM | POA: Diagnosis present

## 2023-05-26 DIAGNOSIS — K219 Gastro-esophageal reflux disease without esophagitis: Secondary | ICD-10-CM | POA: Diagnosis present

## 2023-05-26 DIAGNOSIS — F3170 Bipolar disorder, currently in remission, most recent episode unspecified: Secondary | ICD-10-CM | POA: Diagnosis present

## 2023-05-26 DIAGNOSIS — J439 Emphysema, unspecified: Secondary | ICD-10-CM | POA: Diagnosis present

## 2023-05-26 DIAGNOSIS — I2510 Atherosclerotic heart disease of native coronary artery without angina pectoris: Secondary | ICD-10-CM | POA: Diagnosis present

## 2023-05-26 DIAGNOSIS — J9621 Acute and chronic respiratory failure with hypoxia: Secondary | ICD-10-CM | POA: Diagnosis not present

## 2023-05-26 DIAGNOSIS — R54 Age-related physical debility: Secondary | ICD-10-CM | POA: Diagnosis present

## 2023-05-26 DIAGNOSIS — Z7951 Long term (current) use of inhaled steroids: Secondary | ICD-10-CM

## 2023-05-26 DIAGNOSIS — C3412 Malignant neoplasm of upper lobe, left bronchus or lung: Secondary | ICD-10-CM | POA: Diagnosis present

## 2023-05-26 DIAGNOSIS — E785 Hyperlipidemia, unspecified: Secondary | ICD-10-CM | POA: Diagnosis present

## 2023-05-26 DIAGNOSIS — Z9981 Dependence on supplemental oxygen: Secondary | ICD-10-CM

## 2023-05-26 DIAGNOSIS — E876 Hypokalemia: Secondary | ICD-10-CM | POA: Diagnosis not present

## 2023-05-26 DIAGNOSIS — I10 Essential (primary) hypertension: Secondary | ICD-10-CM | POA: Diagnosis present

## 2023-05-26 DIAGNOSIS — F17210 Nicotine dependence, cigarettes, uncomplicated: Secondary | ICD-10-CM | POA: Diagnosis present

## 2023-05-26 DIAGNOSIS — R339 Retention of urine, unspecified: Secondary | ICD-10-CM | POA: Diagnosis present

## 2023-05-26 DIAGNOSIS — J441 Chronic obstructive pulmonary disease with (acute) exacerbation: Principal | ICD-10-CM | POA: Diagnosis present

## 2023-05-26 DIAGNOSIS — E8809 Other disorders of plasma-protein metabolism, not elsewhere classified: Secondary | ICD-10-CM | POA: Diagnosis present

## 2023-05-26 DIAGNOSIS — Z23 Encounter for immunization: Secondary | ICD-10-CM | POA: Diagnosis present

## 2023-05-26 DIAGNOSIS — Z7984 Long term (current) use of oral hypoglycemic drugs: Secondary | ICD-10-CM

## 2023-05-26 DIAGNOSIS — D649 Anemia, unspecified: Secondary | ICD-10-CM | POA: Diagnosis present

## 2023-05-26 DIAGNOSIS — G8929 Other chronic pain: Secondary | ICD-10-CM | POA: Diagnosis not present

## 2023-05-26 DIAGNOSIS — R0902 Hypoxemia: Secondary | ICD-10-CM

## 2023-05-26 DIAGNOSIS — F1410 Cocaine abuse, uncomplicated: Secondary | ICD-10-CM | POA: Diagnosis present

## 2023-05-26 DIAGNOSIS — Z881 Allergy status to other antibiotic agents status: Secondary | ICD-10-CM

## 2023-05-26 DIAGNOSIS — G9341 Metabolic encephalopathy: Secondary | ICD-10-CM | POA: Diagnosis present

## 2023-05-26 DIAGNOSIS — Z880 Allergy status to penicillin: Secondary | ICD-10-CM

## 2023-05-26 DIAGNOSIS — Z79899 Other long term (current) drug therapy: Secondary | ICD-10-CM

## 2023-05-26 DIAGNOSIS — Z923 Personal history of irradiation: Secondary | ICD-10-CM

## 2023-05-26 DIAGNOSIS — Z6830 Body mass index (BMI) 30.0-30.9, adult: Secondary | ICD-10-CM

## 2023-05-26 LAB — BLOOD GAS, ARTERIAL
Acid-Base Excess: 6.3 mmol/L — ABNORMAL HIGH (ref 0.0–2.0)
Bicarbonate: 34.9 mmol/L — ABNORMAL HIGH (ref 20.0–28.0)
O2 Saturation: 97.5 %
Patient temperature: 37
pCO2 arterial: 76 mm[Hg] (ref 32–48)
pH, Arterial: 7.27 — ABNORMAL LOW (ref 7.35–7.45)
pO2, Arterial: 94 mm[Hg] (ref 83–108)

## 2023-05-26 LAB — RESP PANEL BY RT-PCR (RSV, FLU A&B, COVID)  RVPGX2
Influenza A by PCR: NEGATIVE
Influenza B by PCR: NEGATIVE
Resp Syncytial Virus by PCR: NEGATIVE
SARS Coronavirus 2 by RT PCR: NEGATIVE

## 2023-05-26 LAB — BLOOD GAS, VENOUS
Acid-Base Excess: 7.3 mmol/L — ABNORMAL HIGH (ref 0.0–2.0)
Acid-Base Excess: 6 mmol/L — ABNORMAL HIGH (ref 0.0–2.0)
Bicarbonate: 37.1 mmol/L — ABNORMAL HIGH (ref 20.0–28.0)
Bicarbonate: 36 mmol/L — ABNORMAL HIGH (ref 20.0–28.0)
Drawn by: 4237
Drawn by: 4237
O2 Saturation: 86.9 %
O2 Saturation: 81.2 %
Patient temperature: 36.2
Patient temperature: 37.1
pCO2, Ven: 76 mm[Hg] (ref 44–60)
pCO2, Ven: 88 mm[Hg] (ref 44–60)
pH, Ven: 7.29 (ref 7.25–7.43)
pH, Ven: 7.22 — ABNORMAL LOW (ref 7.25–7.43)
pO2, Ven: 51 mm[Hg] — ABNORMAL HIGH (ref 32–45)
pO2, Ven: 48 mm[Hg] — ABNORMAL HIGH (ref 32–45)

## 2023-05-26 LAB — CBC
HCT: 33.6 % — ABNORMAL LOW (ref 36.0–46.0)
Hemoglobin: 10.2 g/dL — ABNORMAL LOW (ref 12.0–15.0)
MCH: 29.1 pg (ref 26.0–34.0)
MCHC: 30.4 g/dL (ref 30.0–36.0)
MCV: 96 fL (ref 80.0–100.0)
Platelets: 232 10*3/uL (ref 150–400)
RBC: 3.5 MIL/uL — ABNORMAL LOW (ref 3.87–5.11)
RDW: 15 % (ref 11.5–15.5)
WBC: 13 10*3/uL — ABNORMAL HIGH (ref 4.0–10.5)
nRBC: 0 % (ref 0.0–0.2)

## 2023-05-26 LAB — RAPID URINE DRUG SCREEN, HOSP PERFORMED
Amphetamines: NOT DETECTED
Barbiturates: NOT DETECTED
Benzodiazepines: NOT DETECTED
Cocaine: NOT DETECTED
Opiates: NOT DETECTED
Tetrahydrocannabinol: NOT DETECTED

## 2023-05-26 LAB — COMPREHENSIVE METABOLIC PANEL
ALT: 16 U/L (ref 0–44)
AST: 28 U/L (ref 15–41)
Albumin: 3.3 g/dL — ABNORMAL LOW (ref 3.5–5.0)
Alkaline Phosphatase: 72 U/L (ref 38–126)
Anion gap: 8 (ref 5–15)
BUN: 7 mg/dL — ABNORMAL LOW (ref 8–23)
CO2: 33 mmol/L — ABNORMAL HIGH (ref 22–32)
Calcium: 11.3 mg/dL — ABNORMAL HIGH (ref 8.9–10.3)
Chloride: 93 mmol/L — ABNORMAL LOW (ref 98–111)
Creatinine, Ser: 0.44 mg/dL (ref 0.44–1.00)
GFR, Estimated: >60 mL/min (ref >60)
Glucose, Bld: 127 mg/dL — ABNORMAL HIGH (ref 70–99)
Potassium: 4.3 mmol/L (ref 3.5–5.1)
Sodium: 134 mmol/L — ABNORMAL LOW (ref 135–145)
Total Bilirubin: 1 mg/dL (ref <1.2)
Total Protein: 7 g/dL (ref 6.5–8.1)

## 2023-05-26 LAB — BRAIN NATRIURETIC PEPTIDE: B Natriuretic Peptide: 68 pg/mL (ref 0.0–100.0)

## 2023-05-26 MED ORDER — ALBUTEROL SULFATE (2.5 MG/3ML) 0.083% IN NEBU
5.0000 mg | INHALATION_SOLUTION | Freq: Once | RESPIRATORY_TRACT | Status: AC
  Administered 2023-05-26: 5 mg via RESPIRATORY_TRACT
  Filled 2023-05-26: qty 6

## 2023-05-26 MED ORDER — NICOTINE 21 MG/24HR TD PT24
21.0000 mg | MEDICATED_PATCH | Freq: Every day | TRANSDERMAL | Status: DC
  Administered 2023-05-26 – 2023-06-02 (×8): 21 mg via TRANSDERMAL
  Filled 2023-05-26 (×9): qty 1

## 2023-05-26 MED ORDER — PANTOPRAZOLE SODIUM 40 MG IV SOLR
40.0000 mg | INTRAVENOUS | Status: DC
  Administered 2023-05-26 – 2023-05-29 (×4): 40 mg via INTRAVENOUS
  Filled 2023-05-26 (×4): qty 10

## 2023-05-26 MED ORDER — IPRATROPIUM-ALBUTEROL 0.5-2.5 (3) MG/3ML IN SOLN
3.0000 mL | RESPIRATORY_TRACT | Status: DC
  Administered 2023-05-26 – 2023-05-31 (×29): 3 mL via RESPIRATORY_TRACT
  Filled 2023-05-26 (×29): qty 3

## 2023-05-26 MED ORDER — LORAZEPAM 2 MG/ML IJ SOLN
1.0000 mg | INTRAMUSCULAR | Status: DC | PRN
  Filled 2023-05-26: qty 1

## 2023-05-26 MED ORDER — IPRATROPIUM BROMIDE 0.02 % IN SOLN
0.5000 mg | Freq: Once | RESPIRATORY_TRACT | Status: AC
  Administered 2023-05-26: 0.5 mg via RESPIRATORY_TRACT
  Filled 2023-05-26: qty 2.5

## 2023-05-26 MED ORDER — BUDESONIDE 0.25 MG/2ML IN SUSP
0.5000 mg | Freq: Two times a day (BID) | RESPIRATORY_TRACT | Status: DC
  Administered 2023-05-27: 0.25 mg via RESPIRATORY_TRACT
  Administered 2023-05-27 – 2023-05-28 (×2): 0.5 mg via RESPIRATORY_TRACT
  Administered 2023-05-28: 0.25 mg via RESPIRATORY_TRACT
  Administered 2023-05-29 – 2023-06-02 (×9): 0.5 mg via RESPIRATORY_TRACT
  Filled 2023-05-26 (×14): qty 4

## 2023-05-26 MED ORDER — METHYLPREDNISOLONE SODIUM SUCC 40 MG IJ SOLR
40.0000 mg | Freq: Two times a day (BID) | INTRAMUSCULAR | Status: DC
  Administered 2023-05-26 – 2023-05-29 (×6): 40 mg via INTRAVENOUS
  Filled 2023-05-26 (×7): qty 1

## 2023-05-26 MED ORDER — ALBUTEROL SULFATE (2.5 MG/3ML) 0.083% IN NEBU
2.5000 mg | INHALATION_SOLUTION | RESPIRATORY_TRACT | Status: DC | PRN
  Administered 2023-05-26 – 2023-06-02 (×5): 2.5 mg via RESPIRATORY_TRACT
  Filled 2023-05-26 (×5): qty 3

## 2023-05-26 MED ORDER — HYDRALAZINE HCL 20 MG/ML IJ SOLN: 5.0000 mg | INTRAMUSCULAR | Status: DC | PRN

## 2023-05-26 MED ORDER — MAGNESIUM SULFATE 2 GM/50ML IV SOLN
2.0000 g | Freq: Once | INTRAVENOUS | Status: AC
  Administered 2023-05-26: 2 g via INTRAVENOUS
  Filled 2023-05-26: qty 50

## 2023-05-26 MED ORDER — ACETAMINOPHEN 650 MG RE SUPP
650.0000 mg | Freq: Four times a day (QID) | RECTAL | Status: DC | PRN
  Administered 2023-05-27: 650 mg via RECTAL
  Filled 2023-05-26: qty 1

## 2023-05-26 MED ORDER — LACTATED RINGERS IV SOLN: INTRAVENOUS | Status: AC

## 2023-05-26 MED ORDER — ACETAMINOPHEN 325 MG PO TABS: 650.0000 mg | ORAL_TABLET | Freq: Four times a day (QID) | ORAL | Status: DC | PRN

## 2023-05-26 MED ORDER — HALOPERIDOL LACTATE 5 MG/ML IJ SOLN: 2.0000 mg | Freq: Once | INTRAMUSCULAR | Status: DC

## 2023-05-26 MED ORDER — ENOXAPARIN SODIUM 40 MG/0.4ML IJ SOSY
40.0000 mg | PREFILLED_SYRINGE | INTRAMUSCULAR | Status: DC
  Administered 2023-05-27 – 2023-06-02 (×7): 40 mg via SUBCUTANEOUS
  Filled 2023-05-26 (×8): qty 0.4

## 2023-05-26 MED ORDER — ROPINIROLE HCL 1 MG PO TABS
3.0000 mg | ORAL_TABLET | Freq: Every day | ORAL | Status: DC
  Administered 2023-05-29 – 2023-06-01 (×4): 3 mg via ORAL
  Filled 2023-05-26 (×7): qty 3

## 2023-05-26 MED ORDER — METHYLPREDNISOLONE SODIUM SUCC 125 MG IJ SOLR: 120.0000 mg | Freq: Two times a day (BID) | INTRAMUSCULAR | Status: DC

## 2023-05-26 MED ORDER — HALOPERIDOL LACTATE 5 MG/ML IJ SOLN
5.0000 mg | INTRAMUSCULAR | Status: DC | PRN
  Administered 2023-05-26 – 2023-05-28 (×2): 5 mg via INTRAVENOUS
  Filled 2023-05-26 (×2): qty 1

## 2023-05-26 MED ORDER — ALBUTEROL SULFATE (2.5 MG/3ML) 0.083% IN NEBU
15.0000 mg/h | INHALATION_SOLUTION | Freq: Once | RESPIRATORY_TRACT | Status: AC
  Administered 2023-05-26: 15 mg/h via RESPIRATORY_TRACT
  Filled 2023-05-26: qty 18

## 2023-05-26 MED ORDER — METHYLPREDNISOLONE SODIUM SUCC 125 MG IJ SOLR: 80.0000 mg | Freq: Three times a day (TID) | INTRAMUSCULAR | Status: DC

## 2023-05-26 MED ORDER — ALBUTEROL SULFATE (2.5 MG/3ML) 0.083% IN NEBU
10.0000 mg | INHALATION_SOLUTION | Freq: Once | RESPIRATORY_TRACT | Status: AC
  Administered 2023-05-26: 10 mg via RESPIRATORY_TRACT
  Filled 2023-05-26: qty 12

## 2023-05-26 MED ORDER — BUDESONIDE 0.25 MG/2ML IN SUSP
0.2500 mg | Freq: Two times a day (BID) | RESPIRATORY_TRACT | Status: DC
  Administered 2023-05-26: 0.25 mg via RESPIRATORY_TRACT
  Filled 2023-05-26: qty 2

## 2023-05-26 NOTE — ED Triage Notes (Signed)
Pt BIB ems for SOB. Pt has hx of COPD and normally on 3L of oxygen at home. Pt on 6L of oxygen at home 71%. EMS gave duo neb, 1 atrovent, 5 albuterol tx, and 125 solu medrol. Pt 100% on neb and still drops to low 80s on 3L.

## 2023-05-26 NOTE — H&P (Addendum)
TRH H&P   Patient Demographics:    [name], is a 67 y.o. female  MRN: [id number]   DOB - [birth year]  Admit Date - 05/26/2023  Outpatient Primary MD for the patient is Nsumanganyi, Colleen Can, NP  Referring MD/NP/PA: Dr Estell Harpin  Outpatient Specialists: Pulmonary Dr. Sherene Sires, radiation oncology Dr. Mitzi Hansen  Patient coming from: home  Chief Complaint  Patient presents with   Shortness of Breath      HPI:    [name]  is a 67 y.o. female, past medical history significant for COPD, chronic hypoxic respiratory failure on 4 L oxygen, chronic diastolic CHF, diabetes mellitus, still smoking 2 packs/day, history of polysubstance abuse in the past, per husband last time she snorted cocaine was little bit longer than a week ago, send diagnosis of of lung malignancy, s/p radiation treatment, following with radiation oncology as an outpatient. -Patient is lethargic, history was obtained from husband by phone and ED staff, with worsening dyspnea several days ago, at baseline 3 to 4 L or cannula, per husband she had to go up to 6 L, and still hypoxic saturating in the 70s, she had some cough, congestion, husband was finally able to convince her to call EMS, he denies her having fever, chills, or chest pain -In ED patient was noted to be obtundent, VBG confirms pCO2 retention at 76, with bicarb of 33, white blood cell count of 13, her chest x-ray significant forNo acute cardiopulmonary disease, and known interstitial lung disease, with known left upper lung mass, she was started on steroids, repeat VBG showing worsening CO2 retention and respiratory acidosis, but she is still mentating and protecting her airways, ED discussed with PCCM at [facility], who recommended admission to stepdown at [facility] and they will consult, Sotret hospitalist consulted to admit.   Review of  systems:    She is altered, unable to provide reliable review of systems.   With Past History of the following :    Past Medical History:  Diagnosis Date   Anemia    Anxiety    Arthritis    Bipolar 1 disorder (HCC)    CHF (congestive heart failure) (HCC)    COPD (chronic obstructive pulmonary disease) (HCC)    Depression    Diabetes mellitus without complication (HCC)    Dyspnea    Emphysema of lung (HCC)    GERD (gastroesophageal reflux disease)    Headache    migraines   History of hiatal hernia    History of kidney stones    Hyperlipidemia    Hypertension    Neuromuscular disorder (HCC)    neuropathy   On home oxygen therapy    Pneumonia 2015, 2019   Tracheomalacia    Vaginal Pap smear, abnormal       Past Surgical History:  Procedure Laterality Date   CATARACT EXTRACTION W/PHACO Left 01/14/2020   Procedure: CATARACT  EXTRACTION PHACO AND INTRAOCULAR LENS PLACEMENT LEFT EYE;  Surgeon: Fabio Pierce, MD;  Location: [facility];  Service: Ophthalmology;  Laterality: Left;  CDE: 8.18   CATARACT EXTRACTION W/PHACO Right 02/01/2020   Procedure: CATARACT EXTRACTION PHACO AND INTRAOCULAR LENS PLACEMENT RIGHT EYE;  Surgeon: Fabio Pierce, MD;  Location: [facility];  Service: Ophthalmology;  Laterality: Right;  CDE: 9.94   CHEILECTOMY Right 01/28/2020   Procedure: KELLER BUNION IMPLANT RIGHT FOOT CHEILECTOMY RIGHT ROOT;  Surgeon: Asencion Islam, DPM;  Location: [facility] OR;  Service: Podiatry;  Laterality: Right;  BLOCK   CHOLECYSTECTOMY     COLONOSCOPY  July 2010   Dr. Lovell Sheehan: 3 rectal polyps, not enough tissue for pathologic examination, recommended surveillance in 3 years   COLONOSCOPY N/A 10/23/2014   RMR: Multiple colonic polyps removed as described above. No endoscopic explaniation for abdominal pain. however. next tcs 10/2019   ESOPHAGOGASTRODUODENOSCOPY  July 2010   Dr. Lovell Sheehan: gastritis and duodenitis, H.pylori negative   ESOPHAGOGASTRODUODENOSCOPY N/A 10/23/2014   RMR: Normal  EGD. Status post passage of a Maloney dilator. Today's finding s would not explain abdominal pain   EYE SURGERY Left 01/14/2020   cataract removal   GANGLION CYST EXCISION Left 09/07/2018   Procedure: REMOVAL GANGLION OF WRIST;  Surgeon: Vickki Hearing, MD;  Location: [facility];  Service: Orthopedics;  Laterality: Left;   HERNIA REPAIR     Dr. Lovell Sheehan   KIDNEY STONE SURGERY     MALONEY DILATION N/A 10/23/2014   Procedure: Elease Hashimoto DILATION;  Surgeon: Corbin Ade, MD;  Location: [facility];  Service: Endoscopy;  Laterality: N/A;   OPEN REDUCTION INTERNAL FIXATION (ORIF) DISTAL RADIAL FRACTURE Right 12/09/2015   Procedure: OPEN REDUCTION INTERNAL FIXATION (ORIF) RIGHT DISTAL RADIUS;  Surgeon: Betha Loa, MD;  Location: [facility];  Service: Orthopedics;  Laterality: Right;      Social History:     Social History   Tobacco Use   Smoking status: Every Day    Current packs/day: 2.00    Average packs/day: 2.0 packs/day for 55.9 years (111.8 ttl pk-yrs)    Types: Cigarettes    Start date: 1969    Passive exposure: Current   Smokeless tobacco: Never   Tobacco comments:    Smoking 1/2 a pack in 24 hours.  05/31/2022 hfb  Substance Use Topics   Alcohol use: Not Currently    Alcohol/week: 1.0 standard drink of alcohol    Types: 1 Standard drinks or equivalent per week    Comment: alcohol abuse      Family History :     Family History  Adopted: Yes     Home Medications:   Prior to Admission medications   Medication Sig Start Date End Date Taking? Authorizing Provider  acetaminophen (TYLENOL) 500 MG tablet Take 1,000 mg by mouth every 6 (six) hours as needed for moderate pain, fever or headache.   Yes [provider]  albuterol (PROVENTIL) (2.5 MG/3ML) 0.083% nebulizer solution INHALE 1 VIAL VIA NEBULIZER EVERY 4 HOURS Patient taking differently: Take 2.5 mg by nebulization every 4 (four) hours. 02/14/23  Yes Oretha Milch, MD  albuterol (VENTOLIN HFA)  108 (90 Base) MCG/ACT inhaler Inhale 1-2 puffs into the lungs every 6 (six) hours as needed for wheezing or shortness of breath. 12/29/22  Yes [provider]  amLODipine (NORVASC) 10 MG tablet Take 1 tablet (10 mg total) by mouth daily. 04/20/23  Yes Shahmehdi, Seyed A, MD  ammonium lactate (AMLACTIN) 12 % cream  Apply 1 Application topically as needed for dry skin. 05/02/23  Yes McCaughan, Dia D, DPM  Budeson-Glycopyrrol-Formoterol (BREZTRI AEROSPHERE) 160-9-4.8 MCG/ACT AERO Inhale 2 puffs into the lungs in the morning and at bedtime. 04/05/23  Yes Nyoka Cowden, MD  diclofenac Sodium (VOLTAREN) 1 % GEL Apply 1 Application topically 4 (four) times daily as needed (leg pain). 12/29/22  Yes [provider]  DULoxetine (CYMBALTA) 20 MG capsule Take 40 mg by mouth daily. 12/20/22  Yes [provider]  famotidine (PEPCID) 20 MG tablet One after supper 04/19/23  Yes Shahmehdi, Seyed A, MD  fluorometholone (FML) 0.1 % ophthalmic suspension 1 drop See admin instructions. 01/09/23  Yes [provider]  ibuprofen (ADVIL) 800 MG tablet Take 800 mg by mouth 3 (three) times daily. 05/13/23  Yes [provider]  Ipratropium-Albuterol (COMBIVENT RESPIMAT) 20-100 MCG/ACT AERS respimat Inhale 1 puff into the lungs every 6 (six) hours as needed for wheezing. 05/13/23  Yes Oretha Milch, MD  ipratropium-albuterol (DUONEB) 0.5-2.5 (3) MG/3ML SOLN Take 3 mLs by nebulization every 4 (four) hours as needed. 05/10/23  Yes [provider]  metFORMIN (GLUCOPHAGE) 1000 MG tablet Take 1 tablet (1,000 mg total) by mouth 2 (two) times daily. 05/25/22  Yes Reardon, Freddi Starr, NP  nitroGLYCERIN (NITROSTAT) 0.4 MG SL tablet Place 0.4 mg under the tongue every 5 (five) minutes as needed for chest pain. 09/08/21  Yes [provider]  OLANZapine (ZYPREXA) 5 MG tablet Take 5 mg by mouth daily. 05/11/23  Yes [provider]  omeprazole (PRILOSEC) 20 MG capsule Take 20 mg by  mouth daily. 04/25/23  Yes [provider]  ondansetron (ZOFRAN) 4 MG tablet Take 4 mg by mouth every 8 (eight) hours as needed for nausea or vomiting.   Yes [provider]  pantoprazole (PROTONIX) 40 MG tablet Take 1 tablet (40 mg total) by mouth daily. Take 30-60 min before first meal of the day Patient taking differently: Take 40 mg by mouth daily as needed (heartburn). 04/05/23  Yes Nyoka Cowden, MD  RESTASIS 0.05 % ophthalmic emulsion Place 1 drop into both eyes 2 (two) times daily.   Yes [provider]  rOPINIRole (REQUIP) 2 MG tablet Take 3 mg by mouth at bedtime. 12/09/22  Yes [provider]  rosuvastatin (CRESTOR) 10 MG tablet Take 10 mg by mouth every evening. 11/10/22  Yes [provider]  SUMAtriptan (IMITREX) 25 MG tablet Take 25 mg by mouth every 2 (two) hours as needed for migraine. 12/23/20  Yes [provider]  torsemide (DEMADEX) 20 MG tablet Take 1 tablet (20 mg total) by mouth daily as needed. May take an additional 20 mg daily as needed for swelling. 03/30/23  Yes Strader, [place] M, PA-C  cefdinir (OMNICEF) 300 MG capsule Take 1 capsule (300 mg total) by mouth 2 (two) times daily. Patient not taking: Reported on 05/26/2023 05/06/23   Nyoka Cowden, MD  guaiFENesin-dextromethorphan (ROBITUSSIN DM) 100-10 MG/5ML syrup Take 10 mLs by mouth every 8 (eight) hours. Patient not taking: Reported on 05/26/2023 04/19/23   Kendell Bane, MD  methylPREDNISolone (MEDROL DOSEPAK) 4 MG TBPK tablet Medrol Dosepak take as instructed Patient not taking: Reported on 05/26/2023 04/19/23   Kendell Bane, MD  nystatin (MYCOSTATIN) 100000 UNIT/ML suspension Take 5 mLs by mouth 2 (two) times daily. Patient not taking: Reported on 05/26/2023 05/11/23   [provider]  predniSONE (DELTASONE) 10 MG tablet Take 1 tablet (10 mg total) by mouth  daily with breakfast. Patient not taking: Reported on 05/26/2023 05/06/23   Nyoka Cowden, MD  promethazine (PHENERGAN) 25 MG tablet Take 25 mg by mouth every 6 (six) hours as needed for nausea or vomiting. Patient not taking: Reported on 05/26/2023    [provider]     Allergies:     Allergies  Allergen Reactions   Septra [Sulfamethoxazole-Trimethoprim] Other (See Comments)    Patient is unsure if she allergic to septra or penicillin. Patient states one or another caused "rib pain with a little breathing problem".   Penicillins Other (See Comments)    Patient is unsure if she allergic to penicillin or septra. Patient states one or another caused "rib pain with a little breathing problem". Tolerates cephalosporins     Physical Exam:   Vitals  Blood pressure 134/60, pulse 98, temperature (!) 97.1 F (36.2 C), temperature source Axillary, resp. rate (!) 23, height 5\' 4"  (1.626 m), weight 80 kg, SpO2 92%.   1. General Ill-appearing female, frail, appears older than stated age, somnolent on BiPAP  2.  Somnolent, open her eyes to verbal stimuli, oriented x 2, but otherwise confused   3. No F.N deficits, ALL C.Nerves Intact, Strength 5/5 all 4 extremities, Sensation intact all 4 extremities, Plantars down going.  4. Ears and Eyes appear Normal, Conjunctivae clear,   5. Supple Neck, No JVD, No cervical lymphadenopathy appriciated, No Carotid Bruits.  6. Symmetrical Chest wall movement, air entry bilaterally, tachypneic, on BiPAP  7. RRR, No Gallops, Rubs or Murmurs, No Parasternal Heave.  8. Positive Bowel Sounds, Abdomen Soft, No tenderness, No organomegaly appriciated,No rebound -guarding or rigidity.  9.  No Cyanosis, Normal Skin Turgor, No Skin Rash or Bruise.  10. Good muscle tone,  joints appear normal , no effusions    Data Review:    CBC Recent Labs  Lab 05/26/23 1401  WBC 13.0*  HGB 10.2*  HCT 33.6*  PLT 232  MCV 96.0  MCH 29.1  MCHC 30.4  RDW 15.0    ------------------------------------------------------------------------------------------------------------------  Chemistries  Recent Labs  Lab 05/26/23 1401  NA 134*  K 4.3  CL 93*  CO2 33*  GLUCOSE 127*  BUN 7*  CREATININE 0.44  CALCIUM 11.3*  AST 28  ALT 16  ALKPHOS 72  BILITOT 1.0   ------------------------------------------------------------------------------------------------------------------ estimated creatinine clearance is 68.9 mL/min (by C-G formula based on SCr of 0.44 mg/dL). ------------------------------------------------------------------------------------------------------------------ No results for input(s): "TSH", "T4TOTAL", "T3FREE", "THYROIDAB" in the last 72 hours.  Invalid input(s): "FREET3"  Coagulation profile No results for input(s): "INR", "PROTIME" in the last 168 hours. ------------------------------------------------------------------------------------------------------------------- No results for input(s): "DDIMER" in the last 72 hours. -------------------------------------------------------------------------------------------------------------------  Cardiac Enzymes No results for input(s): "CKMB", "TROPONINI", "MYOGLOBIN" in the last 168 hours.  Invalid input(s): "CK" ------------------------------------------------------------------------------------------------------------------    Component Value Date/Time   BNP 68.0 05/26/2023 1401     ---------------------------------------------------------------------------------------------------------------  Urinalysis    Component Value Date/Time   COLORURINE YELLOW 04/16/2023 2250   APPEARANCEUR HAZY (A) 04/16/2023 2250   LABSPEC 1.012 04/16/2023 2250   PHURINE 6.0 04/16/2023 2250   GLUCOSEU NEGATIVE 04/16/2023 2250   HGBUR NEGATIVE 04/16/2023 2250   BILIRUBINUR NEGATIVE 04/16/2023 2250   BILIRUBINUR negative 08/01/2015 1122   KETONESUR 80 (A) 04/16/2023 2250   PROTEINUR 100  (A) 04/16/2023 2250   UROBILINOGEN negative 08/01/2015 1122   UROBILINOGEN 0.2 09/17/2014 2143   NITRITE NEGATIVE 04/16/2023 2250   LEUKOCYTESUR NEGATIVE 04/16/2023 2250    ----------------------------------------------------------------------------------------------------------------   Imaging Results:  DG Chest Port 1 View  Result Date: 05/26/2023 CLINICAL DATA:  Shortness of breath.  History of COPD. EXAM: PORTABLE CHEST 1 VIEW COMPARISON:  04/16/2023. FINDINGS: Redemonstration of increased interstitial markings throughout bilateral lungs, essentially similar to the prior study. There is a faint 9 x 15 mm opacity overlying the left upper lung zone, laterally, which corresponds to the nodule seen on the prior chest CT scan from 01/28/2023. Accurate comparison is not possible due to difference in technique. Bilateral lung fields are otherwise clear. No acute consolidation or lung collapse. Bilateral costophrenic angles are clear. No pleural effusion. Stable cardio-mediastinal silhouette. No acute osseous abnormalities. Subacute/healing posterior left eighth rib fracture noted. The soft tissues are within normal limits. IMPRESSION: *No acute cardiopulmonary abnormality. *Redemonstration of increased interstitial markings, similar to the prior study. No frank pulmonary edema. *Redemonstration of 9 x 15 mm nodule overlying the left upper lung zone, laterally, which corresponds to the nodule seen on the prior chest CT scan from 01/28/2023. Electronically Signed   By: Jules Schick M.D.   On: 05/26/2023 15:24    EKG:  Vent. rate 101 BPM PR interval * ms QRS duration 89 ms QT/QTcB 336/440 ms P-R-T axes * 92 56 Atrial fibrillation(appears to be in sinus arrhythmia, not A-fib, will repeat EKG to confirm) Right axis deviation Baseline wander in lead(s) III  Assessment & Plan:    Active Problems:   Acute metabolic encephalopathy   Essential hypertension   COPD exacerbation (HCC)   Obesity  (BMI 30-39.9)   Chronic pain   Cigarette smoker   Bipolar disorder in partial remission (HCC)   Atherosclerosis of coronary artery without angina pectoris    Acute respiratory failure with hypercapnia Acute on chronic respiratory failure with hypoxia COPD exacerbation -Patient presents with dyspnea, increased work of breathing, encephalopathy and worsening hypoxia -due to COPD exacerbation. -Continue with IV steroids, scheduled DuoNebs, heart on IV Rocephin and azithromycin, and as needed albuterol -Will be admitted to stepdown, with continued BiPAP support, continuous for now, and then as needed as her mentation and respiratory status improved -Will encouraged use incentive spirometry and flutter valve when she is more awake -repeate ABG showing worsening respiratory acidosis with pCO2 retention, but her mentation still appropriate, able to protect her airway, so we will hold on intubation, adjusted BiPAP setting (as well adjusted BiPAP mask as she had significant leak), decreased her oxygen to target saturation 90 to 93%, increased pressure from 8-16 to 10-20, will repeat ABG in 2 hours and monitor closely. -ED physician discussed with PCCM team at [facility], for now they will hold on ICU admission given she is still mentating and protecting her airway, they recommend admission to stepdown unit at [facility] for close monitoring and close PCCM support if she deteriorates and needed intubation.  Acute metabolic encephalopathy -Most likely due to hypercapnia -History of substance abuse in the past, will check urine drug screen as well  Right upper lobe nodule-history of malignancy of right upper lung Patient has known LUL neoplasm, she has been following with radiation oncology for that, as well she is following with pulmonary Dr. Sherene Sires -Discussed with husband, she is supposed to follow with radiation oncology in January.  History of substance abuse -Drug screen  negative -Husband reports last time she used cocaine was a week or so ago.  Smoking -Will start on nicotine patch, she still smokes 2 packs/day  DM (diabetes mellitus), type 2 (HCC) -Hold metformin and keep an insulin sliding scale  especially she will be on IV steroids -Check A1c   Chronic  diastolic CHF (HCC) Last echo 09/2022, EF 65 to 70%. -No evidence of volume overload, will hold home torsemide   Severe recurrent major depression without psychotic features (HCC) Home medication including  Cymbalta, olanzapine specially in the setting of encephalopathy   Essential hypertension Blood blood pressures acceptable, will hold p.o. meds until she is able to take oral intake, meanwhile we will keep on as needed hydralazine  Urinary retention -Patient had in and out to obtain urine drug screen, she had total of 650 cc urine output at once , will keep monitoring bladder scan every 8 hours and insert Foley catheter if needed  DVT Prophylaxis Heparin -  Lovenox   AM Labs Ordered, also please review Full Orders  Family Communication: Admission, patients condition and plan of care including tests being ordered have been discussed with the patient and husband by phone who indicate understanding and agree with the plan and Code Status.  Code Status full code  Likely DC to home  Condition GUARDED   Consults called: ED discussed with pulmonary at [facility]  Admission status: Inpatient  Time spent in minutes : 70 minutes  The patient is critically ill with multi-organ failure.  Critical care was necessary to treat or prevent imminent or life-threatening deterioration of , acute respiratory failure, acute encephalopathyand was exclusive of separately billable procedures and treating other patients. Total critical care time spent by me: 70 minutes Time spent personally by me on obtaining history from patient or surrogate, evaluation of the patient, evaluation of patient's response to  treatment, ordering and review of laboratory studies, ordering and review of radiographic studies, ordering and performing treatments and interventions, and re-evaluation of the patient's condition.  Huey Bienenstock M.D on 05/26/2023 at 4:22 PM   [facility] - Office  [phone]

## 2023-05-26 NOTE — ED Provider Notes (Signed)
[place] EMERGENCY DEPARTMENT AT [facility] Provider Note   CSN: [id number] Arrival date & time: 05/26/23  1310     History  Chief Complaint  Patient presents with   Shortness of Breath    [name] is a 67 y.o. female.  67 year old female with a history of COPD on 3 L home oxygen, CHF, diabetes, and hypertension who presents to the emergency department in respiratory distress.  Patient unable to give history due to mental status.  Per EMS patient started developing some shortness of breath several days ago.  Yesterday was hypoxic and her husband increased her oxygen requirement.  When EMS arrived today her oxygen had been turned up to 6 L and she was only satting 71%.  Was given nebulizer including Atrovent and albuterol and 125 Solu-Medrol was brought to the emergency department.  Patient unable to give additional history because she is drowsy.  Unable to reach family for additional collateral.       Home Medications Prior to Admission medications   Medication Sig Start Date End Date Taking? Authorizing Provider  acetaminophen (TYLENOL) 500 MG tablet Take 1,000 mg by mouth every 6 (six) hours as needed for moderate pain, fever or headache.   Yes [provider]  albuterol (PROVENTIL) (2.5 MG/3ML) 0.083% nebulizer solution INHALE 1 VIAL VIA NEBULIZER EVERY 4 HOURS Patient taking differently: Take 2.5 mg by nebulization every 4 (four) hours. 02/14/23  Yes Oretha Milch, MD  albuterol (VENTOLIN HFA) 108 (90 Base) MCG/ACT inhaler Inhale 1-2 puffs into the lungs every 6 (six) hours as needed for wheezing or shortness of breath. 12/29/22  Yes [provider]  amLODipine (NORVASC) 10 MG tablet Take 1 tablet (10 mg total) by mouth daily. 04/20/23  Yes Shahmehdi, Seyed A, MD  ammonium lactate (AMLACTIN) 12 % cream Apply 1 Application topically as needed for dry skin. 05/02/23  Yes McCaughan, Dia D, DPM  Budeson-Glycopyrrol-Formoterol (BREZTRI  AEROSPHERE) 160-9-4.8 MCG/ACT AERO Inhale 2 puffs into the lungs in the morning and at bedtime. 04/05/23  Yes Nyoka Cowden, MD  diclofenac Sodium (VOLTAREN) 1 % GEL Apply 1 Application topically 4 (four) times daily as needed (leg pain). 12/29/22  Yes [provider]  DULoxetine (CYMBALTA) 20 MG capsule Take 40 mg by mouth daily. 12/20/22  Yes [provider]  famotidine (PEPCID) 20 MG tablet One after supper 04/19/23  Yes Shahmehdi, Seyed A, MD  fluorometholone (FML) 0.1 % ophthalmic suspension 1 drop See admin instructions. 01/09/23  Yes [provider]  ibuprofen (ADVIL) 800 MG tablet Take 800 mg by mouth 3 (three) times daily. 05/13/23  Yes [provider]  Ipratropium-Albuterol (COMBIVENT RESPIMAT) 20-100 MCG/ACT AERS respimat Inhale 1 puff into the lungs every 6 (six) hours as needed for wheezing. 05/13/23  Yes Oretha Milch, MD  ipratropium-albuterol (DUONEB) 0.5-2.5 (3) MG/3ML SOLN Take 3 mLs by nebulization every 4 (four) hours as needed. 05/10/23  Yes [provider]  metFORMIN (GLUCOPHAGE) 1000 MG tablet Take 1 tablet (1,000 mg total) by mouth 2 (two) times daily. 05/25/22  Yes Reardon, Freddi Starr, NP  nitroGLYCERIN (NITROSTAT) 0.4 MG SL tablet Place 0.4 mg under the tongue every 5 (five) minutes as needed for chest pain. 09/08/21  Yes [provider]  OLANZapine (ZYPREXA) 5 MG tablet Take 5 mg by mouth daily. 05/11/23  Yes [provider]  omeprazole (PRILOSEC) 20 MG capsule Take 20 mg by mouth daily. 04/25/23  Yes [provider]  ondansetron [facility])  4 MG tablet Take 4 mg by mouth every 8 (eight) hours as needed for nausea or vomiting.   Yes [provider]  pantoprazole (PROTONIX) 40 MG tablet Take 1 tablet (40 mg total) by mouth daily. Take 30-60 min before first meal of the day Patient taking differently: Take 40 mg by mouth daily as needed (heartburn). 04/05/23  Yes Nyoka Cowden, MD  RESTASIS 0.05 % ophthalmic  emulsion Place 1 drop into both eyes 2 (two) times daily.   Yes [provider]  rOPINIRole (REQUIP) 2 MG tablet Take 3 mg by mouth at bedtime. 12/09/22  Yes [provider]  rosuvastatin (CRESTOR) 10 MG tablet Take 10 mg by mouth every evening. 11/10/22  Yes [provider]  SUMAtriptan (IMITREX) 25 MG tablet Take 25 mg by mouth every 2 (two) hours as needed for migraine. 12/23/20  Yes [provider]  torsemide (DEMADEX) 20 MG tablet Take 1 tablet (20 mg total) by mouth daily as needed. May take an additional 20 mg daily as needed for swelling. 03/30/23  Yes Strader, [place] M, PA-C  cefdinir (OMNICEF) 300 MG capsule Take 1 capsule (300 mg total) by mouth 2 (two) times daily. Patient not taking: Reported on 05/26/2023 05/06/23   Nyoka Cowden, MD  guaiFENesin-dextromethorphan (ROBITUSSIN DM) 100-10 MG/5ML syrup Take 10 mLs by mouth every 8 (eight) hours. Patient not taking: Reported on 05/26/2023 04/19/23   Kendell Bane, MD  methylPREDNISolone (MEDROL DOSEPAK) 4 MG TBPK tablet Medrol Dosepak take as instructed Patient not taking: Reported on 05/26/2023 04/19/23   Kendell Bane, MD  nystatin (MYCOSTATIN) 100000 UNIT/ML suspension Take 5 mLs by mouth 2 (two) times daily. Patient not taking: Reported on 05/26/2023 05/11/23   [provider]  predniSONE (DELTASONE) 10 MG tablet Take 1 tablet (10 mg total) by mouth daily with breakfast. Patient not taking: Reported on 05/26/2023 05/06/23   Nyoka Cowden, MD  promethazine (PHENERGAN) 25 MG tablet Take 25 mg by mouth every 6 (six) hours as needed for nausea or vomiting. Patient not taking: Reported on 05/26/2023    [provider]      Allergies    Septra [sulfamethoxazole-trimethoprim] and Penicillins    Review of Systems   Review of Systems  Physical Exam Updated Vital Signs BP 134/60   Pulse 98   Temp (!) 97.1 F (36.2 C) (Axillary)   Resp (!) 23   Ht 5\' 4"  (1.626 m)   Wt 80  kg   SpO2 95%   BMI 30.27 kg/m  Physical Exam Vitals and nursing note reviewed.  Constitutional:      General: She is in acute distress.     Appearance: She is well-developed. She is ill-appearing.     Comments: Drowsy.  Able to tell me her name.  HENT:     Head: Normocephalic and atraumatic.     Right Ear: External ear normal.     Left Ear: External ear normal.     Nose: Nose normal.  Eyes:     Extraocular Movements: Extraocular movements intact.     Conjunctiva/sclera: Conjunctivae normal.     Pupils: Pupils are equal, round, and reactive to light.  Cardiovascular:     Rate and Rhythm: Normal rate and regular rhythm.     Heart sounds: No murmur heard. Pulmonary:     Effort: Respiratory distress present.     Breath sounds: Wheezing (Diffuse) present.     Comments: Satting in the low 90s on a  nonrebreather Musculoskeletal:     Cervical back: Normal range of motion and neck supple.     Right lower leg: No edema.     Left lower leg: No edema.  Skin:    General: Skin is warm and dry.  Neurological:     Mental Status: She is oriented to person, place, and time. Mental status is at baseline.  Psychiatric:        Mood and Affect: Mood normal.     ED Results / Procedures / Treatments   Labs (all labs ordered are listed, but only abnormal results are displayed) Labs Reviewed  COMPREHENSIVE METABOLIC PANEL - Abnormal; Notable for the following components:      Result Value   Sodium 134 (*)    Chloride 93 (*)    CO2 33 (*)    Glucose, Bld 127 (*)    BUN 7 (*)    Calcium 11.3 (*)    Albumin 3.3 (*)    All other components within normal limits  CBC - Abnormal; Notable for the following components:   WBC 13.0 (*)    RBC 3.50 (*)    Hemoglobin 10.2 (*)    HCT 33.6 (*)    All other components within normal limits  BLOOD GAS, VENOUS - Abnormal; Notable for the following components:   pCO2, Ven 76 (*)    pO2, Ven 51 (*)    Bicarbonate 37.1 (*)    Acid-Base Excess 7.3 (*)     All other components within normal limits  RESP PANEL BY RT-PCR (RSV, FLU A&B, COVID)  RVPGX2  BRAIN NATRIURETIC PEPTIDE  BLOOD GAS, VENOUS    EKG EKG Interpretation Date/Time:  Thursday May 26 2023 13:26:40 EST Ventricular Rate:  101 PR Interval:    QRS Duration:  89 QT Interval:  336 QTC Calculation: 440 R Axis:   92  Text Interpretation: Sinus tachycardia Baseline wander in lead(s) III  Confirmed by Vonita Moss [phone]) on 05/26/2023 2:13:37 PM  Radiology DG Chest Port 1 View  Result Date: 05/26/2023 CLINICAL DATA:  Shortness of breath.  History of COPD. EXAM: PORTABLE CHEST 1 VIEW COMPARISON:  04/16/2023. FINDINGS: Redemonstration of increased interstitial markings throughout bilateral lungs, essentially similar to the prior study. There is a faint 9 x 15 mm opacity overlying the left upper lung zone, laterally, which corresponds to the nodule seen on the prior chest CT scan from 01/28/2023. Accurate comparison is not possible due to difference in technique. Bilateral lung fields are otherwise clear. No acute consolidation or lung collapse. Bilateral costophrenic angles are clear. No pleural effusion. Stable cardio-mediastinal silhouette. No acute osseous abnormalities. Subacute/healing posterior left eighth rib fracture noted. The soft tissues are within normal limits. IMPRESSION: *No acute cardiopulmonary abnormality. *Redemonstration of increased interstitial markings, similar to the prior study. No frank pulmonary edema. *Redemonstration of 9 x 15 mm nodule overlying the left upper lung zone, laterally, which corresponds to the nodule seen on the prior chest CT scan from 01/28/2023. Electronically Signed   By: Jules Schick M.D.   On: 05/26/2023 15:24    Procedures Procedures    Medications Ordered in ED Medications  ipratropium (ATROVENT) nebulizer solution 0.5 mg (0.5 mg Nebulization Given by Other 05/26/23 1344)  albuterol (PROVENTIL) (2.5 MG/3ML) 0.083%  nebulizer solution 10 mg (10 mg Nebulization Given by Other 05/26/23 1344)  magnesium sulfate IVPB 2 g 50 mL (0 g Intravenous Stopped 05/26/23 1503)  albuterol (PROVENTIL) (2.5 MG/3ML) 0.083% nebulizer solution 5 mg (5 mg Nebulization  Given 05/26/23 1433)  albuterol (PROVENTIL) (2.5 MG/3ML) 0.083% nebulizer solution (15 mg/hr Nebulization Given 05/26/23 1600)    ED Course/ Medical Decision Making/ A&P Clinical Course as of 05/26/23 1602  Thu May 26, 2023  1330 Unable to reach husband or brother via phone [RP]  1541 Signed out to Dr Estell Harpin [RP]    Clinical Course User Index [RP] Rondel Baton, MD                                 Medical Decision Making Amount and/or Complexity of Data Reviewed Labs: ordered. Radiology: ordered.  Risk Prescription drug management.   [name] is a 67 y.o. female with comorbidities that complicate the patient evaluation including COPD on 3 L home oxygen, CHF, diabetes, and hypertension who presents to the emergency department in respiratory distress.    Initial Ddx:  COPD exacerbation, CHF exacerbation, pneumonia, PE  MDM/Course:  Patient presents to the emergency department in respiratory distress.  Limited available history available at this time.  EMS already gave her steroids and nebulizer treatment.  On arrival she is drowsy but oriented to self.  Also was hypoxic.  Does have significant accessory muscle use and diffuse wheezing throughout.  Chest x-ray without obvious infiltrate.  Concerned about COPD exacerbation.  Blood gas was sent and shows a CO2 of 76.  COVID and flu negative.  BNP WNL.  Patient given several rounds of continuous albuterol and upon re-evaluation was starting to have improved mental status.  Signed out to oncoming physician awaiting reassessment and repeat blood gas.  This patient presents to the ED for concern of complaints listed in HPI, this involves an extensive number of treatment options, and is a  complaint that carries with it a high risk of complications and morbidity. Disposition including potential need for admission considered.   Dispo: Pending remainder of workup  Additional history obtained from EMS Records reviewed Outpatient Clinic Notes The following labs were independently interpreted: VBG and show  hypercapnic respiratory failure I independently reviewed the following imaging with scope of interpretation limited to determining acute life threatening conditions related to emergency care: Chest x-ray and agree with the radiologist interpretation with the following exceptions: none I personally reviewed and interpreted cardiac monitoring: sinus tachycardia I personally reviewed and interpreted the pt's EKG: see above for interpretation  I have reviewed the patients home medications and made adjustments as needed  Portions of this note were generated with Dragon dictation software. Dictation errors may occur despite best attempts at proofreading.    CRITICAL CARE Performed by: Rondel Baton   Total critical care time: 45 minutes  Critical care time was exclusive of separately billable procedures and treating other patients.  Critical care was necessary to treat or prevent imminent or life-threatening deterioration.  Critical care was time spent personally by me on the following activities: development of treatment plan with patient and/or surrogate as well as nursing, discussions with consultants, evaluation of patient's response to treatment, examination of patient, obtaining history from patient or surrogate, ordering and performing treatments and interventions, ordering and review of laboratory studies, ordering and review of radiographic studies, pulse oximetry and re-evaluation of patient's condition.   Final Clinical Impression(s) / ED Diagnoses Final diagnoses:  COPD exacerbation (HCC)  Hypoxia  Respiratory distress    Rx / DC Orders ED Discharge Orders      None  Rondel Baton, MD 05/26/23 [phone]

## 2023-05-26 NOTE — ED Notes (Signed)
Pt husband called for an update.

## 2023-05-26 NOTE — Progress Notes (Signed)
@  2042, Dr. Loney Loh, on-call for attending, text-paged regarding pt's new ABG results and to inform provider of pt's arrival from [facility] ED for possible CCM consult.  CCM promptly came to bedside to assess pt. Orders placed and will be implemented.

## 2023-05-26 NOTE — Progress Notes (Signed)
Called report to [place], RRT at [facility] covering [facility].

## 2023-05-26 NOTE — Consult Note (Signed)
NAME:  [name], MRN:  [id number], DOB:  Dec 02, 1955, LOS: 0 ADMISSION DATE:  05/26/2023, CONSULTATION DATE:  05/26/23 REFERRING MD:  Elgergawy CHIEF COMPLAINT:  Dyspnea   History of Present Illness:  [name] is a 67 y.o. female who has a PMH as below including but not limited to COPD with chronic hypoxic respiratory failure on 4L O2 (Followed by Dr. Sherene Sires, PFTS from 2018 with FEV1 51% pred), recent diagnosis LUL NSCLC s/p XRT, ongoing tobacco dependence polysubstance abuse (last used cocaine roughly 1 week PTA), dCHF, DM.  She presented to [facility] ED 11/21 with worsening dyspnea for a few days and increase in O2 from baseline 4L up to 6L with ongoing hypoxia in the 70s. She apparently had some cough and congestion at the time as well.  In ED, she was found to have hypoxic and hypercapnic respiratory failure. CXR was negative for acute process. Repeat VBG had worsening hypercapnia; therefore, she was started on BiPAP and was transferred to [facility] for further management.  After arrival to [facility], she was initially a bit somnolent so PCCM was called to see her in consultation. At the time of our arrival, she has very active RLS (has this at baseline). She is awake, able to answer basic questions, follow basic commands. Shortly after our departure, nurse informed us that she was slightly agitated and intermittently attempting to remove mask while getting out of bed.  Pertinent  Medical History:  has COPD GOLD 2/ still smoker ; Panic disorder; Tobacco use disorder; Hyperlipidemia; Benzodiazepine dependence (HCC); Mild dysplasia of cervix; Essential hypertension; History of colonic polyps; COPD exacerbation (HCC); GERD without esophagitis; Bipolar 1 disorder (HCC); Anxiety; Polysubstance abuse (HCC); Obesity (BMI 30-39.9); Acute metabolic encephalopathy; Dysphagia, pharyngoesophageal phase; Hx of colonic polyps; Abdominal pain, chronic, epigastric; Low grade squamous intraepithelial lesion  (LGSIL) on Papanicolaou smear of cervix; Anorgasmia of female; Alcohol abuse; Chronic pain; Hyperglycemia; DM (diabetes mellitus), type 2 (HCC); Respiratory bronchiolitis associated interstitial lung disease (HCC); Ganglion cyst of dorsum of left wrist s/p removal 09/07/18; Airway malacia; Choking episode; Chronic hypoxemic respiratory failure (HCC); Degeneration of lumbar intervertebral disc; Lumbar degenerative disc disease; Hallucinations; Major depressive disorder, single episode, unspecified; Other long term (current) drug therapy; Severe recurrent major depression without psychotic features (HCC); Lumbar radiculopathy; Lumbar spondylosis; Hip pain; Encounter for gynecological examination with Papanicolaou smear of cervix; Encounter for screening fecal occult blood testing; Cigarette smoker; Hyponatremia; Elevated troponin; Leukocytosis; NSTEMI (non-ST elevated myocardial infarction) (HCC); Pressure injury of skin; Diastolic CHF (HCC); Oral candidiasis; Breast mass; Malignant neoplasm of upper lobe of left lung (HCC); Parotid nodule; Closed fracture of distal end of right radius; SIRS (systemic inflammatory response syndrome) (HCC); Wounds and injuries; Acute myocardial infarction [facility]); Angina pectoris (HCC); Chest pain; Epigastric pain; Wheezing; Vitamin D deficiency; Synovial cyst of left knee; Spinal stenosis of lumbar region; Recurrent falls; Polyneuropathy due to type 2 diabetes mellitus (HCC); Oxygen dependent; Polyneuropathy; Muscle weakness; Mood disorder with manic features due to general medical condition; Lumbago with sciatica; Localized edema; Insomnia; History of recurrent pneumonia; Hypercalcemia; Dysuria; Cough; Bipolar disorder in partial remission (HCC); Abnormal finding on evaluation procedure; Actinic keratosis; Atherosclerosis of coronary artery without angina pectoris; Long-term current use of opiate analgesic; Abnormal cytological findings in specimens from other female genital organs;  Disorder of trachea or bronchus; and Acute on chronic respiratory failure with hypoxia and hypercapnia (HCC) on their problem list.  Significant Hospital Events: Including procedures, antibiotic start and stop dates in addition to other pertinent events  11/21 admit.  Interim History / Subjective:  On BiPAP. Comfortable, very restless legs. Denies dyspnea. Able to follow simple basic commands.  Objective:  Blood pressure (!) 134/58, pulse (!) 111, temperature 98 F (36.7 C), resp. rate (!) 24, height 5\' 4"  (1.626 m), weight 72.5 kg, SpO2 96%.    Vent Mode: BIPAP FiO2 (%):  [40 %-50 %] 40 % Set Rate:  [14 bmp] 14 bmp PEEP:  [8 cmH20-10 cmH20] 8 cmH20 Pressure Support:  [16 cmH20-20 cmH20] 20 cmH20  No intake or output data in the 24 hours ending 05/26/23 2117 Filed Weights   05/26/23 1329 05/26/23 1954  Weight: 80 kg 72.5 kg    Examination: General: Adult female, chronically ill appearing, resting in bed, in NAD. Neuro: Awake, tracks, answers basic questions. MAE's. Very restless legs. HEENT: [place]/AT. Sclerae anicteric. EOMI. BiPAP in place. Cardiovascular: RRR, no M/R/G.  Lungs: Respirations even and unlabored.  Faint wheezes bilaterally. Abdomen: BS x 4, soft, NT/ND.  Musculoskeletal: Very restless legs. No gross deformities, no edema.  Skin: Intact, warm, no rashes.   Assessment & Plan:   Acute on chronic hypoxic and hypercapnic respiratory failure - presumed 2/2 AECOPD. - Continue BiPAP through the night and continue with naps. - Given agitation and intermittent restlessness, will give Haldol x 1 and very lose dose Ativan. Avoid oversedation. - Continue supplemental O2 as needed to maintain SpO2 > 88% once off BiPAP, do not reach for high 90s - 100%. - Bronchial hygiene.  AECOPD. Ongoing tobacco dependence. - Continue BD's, Steroids. - Agree with empiric Ceftriaxone/Azithromycin. - Continue nicotine patch. - Tobacco cessation counseling when able.  Recent  diagnosis LUL NSCLC s/p XRT. - F/u with oncology and pulmonary as outpatient.  Acute metabolic encephalopathy - 2/2 #1. - Supportive care as above.  Hx RLS. - Continue PTA Ropinirole.   Polysubstance abuse - husband reported recent cocaine use about 1 week PTA. UDS on admit negative. - Cessation counseling when able.   Rest per primary team.   Best practice (evaluated daily):  Per primary team.  Labs   CBC: Recent Labs  Lab 05/26/23 1401  WBC 13.0*  HGB 10.2*  HCT 33.6*  MCV 96.0  PLT 232    Basic Metabolic Panel: Recent Labs  Lab 05/26/23 1401  NA 134*  K 4.3  CL 93*  CO2 33*  GLUCOSE 127*  BUN 7*  CREATININE 0.44  CALCIUM 11.3*   GFR: Estimated Creatinine Clearance: 65.7 mL/min (by C-G formula based on SCr of 0.44 mg/dL). Recent Labs  Lab 05/26/23 1401  WBC 13.0*    Liver Function Tests: Recent Labs  Lab 05/26/23 1401  AST 28  ALT 16  ALKPHOS 72  BILITOT 1.0  PROT 7.0  ALBUMIN 3.3*   No results for input(s): "LIPASE", "AMYLASE" in the last 168 hours. No results for input(s): "AMMONIA" in the last 168 hours.  ABG    Component Value Date/Time   PHART 7.27 (L) 05/26/2023 2015   PCO2ART 76 (HH) 05/26/2023 2015   PO2ART 94 05/26/2023 2015   HCO3 34.9 (H) 05/26/2023 2015   TCO2 20.7 07/05/2014 1122   ACIDBASEDEF 0.7 07/05/2014 1122   O2SAT 97.5 05/26/2023 2015     Coagulation Profile: No results for input(s): "INR", "PROTIME" in the last 168 hours.  Cardiac Enzymes: No results for input(s): "CKTOTAL", "CKMB", "CKMBINDEX", "TROPONINI" in the last 168 hours.  HbA1C: Hgb A1c MFr Bld  Date/Time Value Ref Range Status  04/17/2023 04:51 AM 6.3 (H) 4.8 -  5.6 % Final    Comment:    (NOTE) Pre diabetes:          5.7%-6.4%  Diabetes:              >6.4%  Glycemic control for   <7.0% adults with diabetes   09/23/2022 11:13 AM 5.9 (H) 4.8 - 5.6 % Final    Comment:    (NOTE)         Prediabetes: 5.7 - 6.4         Diabetes: >6.4          Glycemic control for adults with diabetes: <7.0     CBG: No results for input(s): "GLUCAP" in the last 168 hours.  Review of Systems:   Unable to obtain as pt is on BiPAP.  Past Medical History:  She,  has a past medical history of Anemia, Anxiety, Arthritis, Bipolar 1 disorder (HCC), CHF (congestive heart failure) (HCC), COPD (chronic obstructive pulmonary disease) (HCC), Depression, Diabetes mellitus without complication (HCC), Dyspnea, Emphysema of lung (HCC), GERD (gastroesophageal reflux disease), Headache, History of hiatal hernia, History of kidney stones, Hyperlipidemia, Hypertension, Neuromuscular disorder (HCC), On home oxygen therapy, Pneumonia (2015, 2019), Tracheomalacia, and Vaginal Pap smear, abnormal.   Surgical History:   Past Surgical History:  Procedure Laterality Date   CATARACT EXTRACTION W/PHACO Left 01/14/2020   Procedure: CATARACT EXTRACTION PHACO AND INTRAOCULAR LENS PLACEMENT LEFT EYE;  Surgeon: Fabio Pierce, MD;  Location: [facility];  Service: Ophthalmology;  Laterality: Left;  CDE: 8.18   CATARACT EXTRACTION W/PHACO Right 02/01/2020   Procedure: CATARACT EXTRACTION PHACO AND INTRAOCULAR LENS PLACEMENT RIGHT EYE;  Surgeon: Fabio Pierce, MD;  Location: [facility];  Service: Ophthalmology;  Laterality: Right;  CDE: 9.94   CHEILECTOMY Right 01/28/2020   Procedure: KELLER BUNION IMPLANT RIGHT FOOT CHEILECTOMY RIGHT ROOT;  Surgeon: Asencion Islam, DPM;  Location: [facility] OR;  Service: Podiatry;  Laterality: Right;  BLOCK   CHOLECYSTECTOMY     COLONOSCOPY  July 2010   Dr. Lovell Sheehan: 3 rectal polyps, not enough tissue for pathologic examination, recommended surveillance in 3 years   COLONOSCOPY N/A 10/23/2014   RMR: Multiple colonic polyps removed as described above. No endoscopic explaniation for abdominal pain. however. next tcs 10/2019   ESOPHAGOGASTRODUODENOSCOPY  July 2010   Dr. Lovell Sheehan: gastritis and duodenitis, H.pylori negative   ESOPHAGOGASTRODUODENOSCOPY N/A 10/23/2014    RMR: Normal EGD. Status post passage of a Maloney dilator. Today's finding s would not explain abdominal pain   EYE SURGERY Left 01/14/2020   cataract removal   GANGLION CYST EXCISION Left 09/07/2018   Procedure: REMOVAL GANGLION OF WRIST;  Surgeon: Vickki Hearing, MD;  Location: [facility];  Service: Orthopedics;  Laterality: Left;   HERNIA REPAIR     Dr. Lovell Sheehan   KIDNEY STONE SURGERY     MALONEY DILATION N/A 10/23/2014   Procedure: Elease Hashimoto DILATION;  Surgeon: Corbin Ade, MD;  Location: [facility];  Service: Endoscopy;  Laterality: N/A;   OPEN REDUCTION INTERNAL FIXATION (ORIF) DISTAL RADIAL FRACTURE Right 12/09/2015   Procedure: OPEN REDUCTION INTERNAL FIXATION (ORIF) RIGHT DISTAL RADIUS;  Surgeon: Betha Loa, MD;  Location: [facility];  Service: Orthopedics;  Laterality: Right;     Social History:   reports that she has been smoking cigarettes. She started smoking about 55 years ago. She has a 111.8 pack-year smoking history. She has been exposed to tobacco smoke. She has never used smokeless tobacco. She reports that she does not currently use alcohol  after a past usage of about 1.0 standard drink of alcohol per week. She reports that she does not currently use drugs after having used the following drugs: Cocaine.   Family History:  Her family history is not on file. She was adopted.   Allergies Allergies  Allergen Reactions   Septra [Sulfamethoxazole-Trimethoprim] Other (See Comments)    Patient is unsure if she allergic to septra or penicillin. Patient states one or another caused "rib pain with a little breathing problem".   Penicillins Other (See Comments)    Patient is unsure if she allergic to penicillin or septra. Patient states one or another caused "rib pain with a little breathing problem". Tolerates cephalosporins     Home Medications  Prior to Admission medications   Medication Sig Start Date End Date Taking? Authorizing Provider  acetaminophen  (TYLENOL) 500 MG tablet Take 1,000 mg by mouth every 6 (six) hours as needed for moderate pain, fever or headache.   Yes [provider]  albuterol (PROVENTIL) (2.5 MG/3ML) 0.083% nebulizer solution INHALE 1 VIAL VIA NEBULIZER EVERY 4 HOURS Patient taking differently: Take 2.5 mg by nebulization every 4 (four) hours. 02/14/23  Yes Oretha Milch, MD  albuterol (VENTOLIN HFA) 108 (90 Base) MCG/ACT inhaler Inhale 1-2 puffs into the lungs every 6 (six) hours as needed for wheezing or shortness of breath. 12/29/22  Yes [provider]  amLODipine (NORVASC) 10 MG tablet Take 1 tablet (10 mg total) by mouth daily. 04/20/23  Yes Shahmehdi, Seyed A, MD  ammonium lactate (AMLACTIN) 12 % cream Apply 1 Application topically as needed for dry skin. 05/02/23  Yes McCaughan, Dia D, DPM  Budeson-Glycopyrrol-Formoterol (BREZTRI AEROSPHERE) 160-9-4.8 MCG/ACT AERO Inhale 2 puffs into the lungs in the morning and at bedtime. 04/05/23  Yes Nyoka Cowden, MD  diclofenac Sodium (VOLTAREN) 1 % GEL Apply 1 Application topically 4 (four) times daily as needed (leg pain). 12/29/22  Yes [provider]  DULoxetine (CYMBALTA) 20 MG capsule Take 40 mg by mouth daily. 12/20/22  Yes [provider]  famotidine (PEPCID) 20 MG tablet One after supper 04/19/23  Yes Shahmehdi, Seyed A, MD  fluorometholone (FML) 0.1 % ophthalmic suspension 1 drop See admin instructions. 01/09/23  Yes [provider]  ibuprofen (ADVIL) 800 MG tablet Take 800 mg by mouth 3 (three) times daily. 05/13/23  Yes [provider]  Ipratropium-Albuterol (COMBIVENT RESPIMAT) 20-100 MCG/ACT AERS respimat Inhale 1 puff into the lungs every 6 (six) hours as needed for wheezing. 05/13/23  Yes Oretha Milch, MD  ipratropium-albuterol (DUONEB) 0.5-2.5 (3) MG/3ML SOLN Take 3 mLs by nebulization every 4 (four) hours as needed. 05/10/23  Yes [provider]  metFORMIN (GLUCOPHAGE) 1000 MG tablet Take 1 tablet (1,000 mg  total) by mouth 2 (two) times daily. 05/25/22  Yes Reardon, Freddi Starr, NP  nitroGLYCERIN (NITROSTAT) 0.4 MG SL tablet Place 0.4 mg under the tongue every 5 (five) minutes as needed for chest pain. 09/08/21  Yes [provider]  OLANZapine (ZYPREXA) 5 MG tablet Take 5 mg by mouth daily. 05/11/23  Yes [provider]  omeprazole (PRILOSEC) 20 MG capsule Take 20 mg by mouth daily. 04/25/23  Yes [provider]  ondansetron (ZOFRAN) 4 MG tablet Take 4 mg by mouth every 8 (eight) hours as needed for nausea or vomiting.   Yes [provider]  pantoprazole (PROTONIX) 40 MG tablet Take 1 tablet (40 mg total) by mouth daily. Take 30-60 min before first meal of the  day Patient taking differently: Take 40 mg by mouth daily as needed (heartburn). 04/05/23  Yes Nyoka Cowden, MD  RESTASIS 0.05 % ophthalmic emulsion Place 1 drop into both eyes 2 (two) times daily.   Yes [provider]  rOPINIRole (REQUIP) 2 MG tablet Take 3 mg by mouth at bedtime. 12/09/22  Yes [provider]  rosuvastatin (CRESTOR) 10 MG tablet Take 10 mg by mouth every evening. 11/10/22  Yes [provider]  SUMAtriptan (IMITREX) 25 MG tablet Take 25 mg by mouth every 2 (two) hours as needed for migraine. 12/23/20  Yes [provider]  torsemide (DEMADEX) 20 MG tablet Take 1 tablet (20 mg total) by mouth daily as needed. May take an additional 20 mg daily as needed for swelling. 03/30/23  Yes Strader, [place] M, PA-C  cefdinir (OMNICEF) 300 MG capsule Take 1 capsule (300 mg total) by mouth 2 (two) times daily. Patient not taking: Reported on 05/26/2023 05/06/23   Nyoka Cowden, MD  guaiFENesin-dextromethorphan (ROBITUSSIN DM) 100-10 MG/5ML syrup Take 10 mLs by mouth every 8 (eight) hours. Patient not taking: Reported on 05/26/2023 04/19/23   Kendell Bane, MD  methylPREDNISolone (MEDROL DOSEPAK) 4 MG TBPK tablet Medrol Dosepak take as instructed Patient not taking:  Reported on 05/26/2023 04/19/23   Kendell Bane, MD  nystatin (MYCOSTATIN) 100000 UNIT/ML suspension Take 5 mLs by mouth 2 (two) times daily. Patient not taking: Reported on 05/26/2023 05/11/23   [provider]  predniSONE (DELTASONE) 10 MG tablet Take 1 tablet (10 mg total) by mouth daily with breakfast. Patient not taking: Reported on 05/26/2023 05/06/23   Nyoka Cowden, MD  promethazine (PHENERGAN) 25 MG tablet Take 25 mg by mouth every 6 (six) hours as needed for nausea or vomiting. Patient not taking: Reported on 05/26/2023    [provider]      Rutherford Guys, PA - [facility] For pager details, please see AMION or use Epic chat  After 1900, please call [facility] for cross coverage needs 05/26/2023, 9:17 PM

## 2023-05-26 NOTE — Progress Notes (Signed)
   05/26/23 2244 05/26/23 2246 05/26/23 2247  Vitals  Pulse Rate (!) 112 (!) 112 (!) 109  ECG Heart Rate (!) 113 (!) 113 (!) 118  Oxygen Therapy  SpO2 95 % 90 % (!) 78 %  O2 Device Bi-PAP Nasal Cannula Nasal Cannula    05/26/23 2248 05/26/23 2251  Vitals  Pulse Rate  --   --   ECG Heart Rate  --   --   Oxygen Therapy  SpO2 (!) 83 % 92 %  O2 Device Bi-PAP Bi-PAP   This RN placed pt on 6L [place] to assess if pt could tolerate being off Bi-Pap long enough to take POs. Within 3 minutes of Bi-Pap removal, pt SpO2 dropped to 78%. Bi-Pap replaced and SpO2 recovered. Provider will be notified of inability to take POs.

## 2023-05-26 NOTE — ED Notes (Signed)
Pt resting with BiPap on. Vital signs 134/60, 95% BiPap, 99, 29 RR.

## 2023-05-26 NOTE — ED Provider Notes (Signed)
Patient with COPD exacerbation.  Venous gas was slightly worse on the second gas.  Patient is awake and protecting her airway.  I spoke to Dr.Chand of critical care and he stated the patient can be admitted to medicine to the stepdown unit over at [facility] and critical care can be consulted   Bethann Berkshire, MD 05/26/23 1659

## 2023-05-27 DIAGNOSIS — J9622 Acute and chronic respiratory failure with hypercapnia: Secondary | ICD-10-CM | POA: Diagnosis not present

## 2023-05-27 DIAGNOSIS — J9621 Acute and chronic respiratory failure with hypoxia: Secondary | ICD-10-CM | POA: Diagnosis not present

## 2023-05-27 LAB — CBC
HCT: 30.4 % — ABNORMAL LOW (ref 36.0–46.0)
HCT: 31.4 % — ABNORMAL LOW (ref 36.0–46.0)
Hemoglobin: 9.5 g/dL — ABNORMAL LOW (ref 12.0–15.0)
Hemoglobin: 9.7 g/dL — ABNORMAL LOW (ref 12.0–15.0)
MCH: 29.3 pg (ref 26.0–34.0)
MCH: 28.7 pg (ref 26.0–34.0)
MCHC: 31.3 g/dL (ref 30.0–36.0)
MCHC: 30.9 g/dL (ref 30.0–36.0)
MCV: 93.8 fL (ref 80.0–100.0)
MCV: 92.9 fL (ref 80.0–100.0)
Platelets: 215 10*3/uL (ref 150–400)
Platelets: 247 10*3/uL (ref 150–400)
RBC: 3.24 MIL/uL — ABNORMAL LOW (ref 3.87–5.11)
RBC: 3.38 MIL/uL — ABNORMAL LOW (ref 3.87–5.11)
RDW: 14.6 % (ref 11.5–15.5)
RDW: 14.8 % (ref 11.5–15.5)
WBC: 6.6 10*3/uL (ref 4.0–10.5)
WBC: 13.6 10*3/uL — ABNORMAL HIGH (ref 4.0–10.5)
nRBC: 0 % (ref 0.0–0.2)
nRBC: 0 % (ref 0.0–0.2)

## 2023-05-27 LAB — BASIC METABOLIC PANEL
Anion gap: 10 (ref 5–15)
Anion gap: 13 (ref 5–15)
BUN: 7 mg/dL — ABNORMAL LOW (ref 8–23)
BUN: 10 mg/dL (ref 8–23)
CO2: 31 mmol/L (ref 22–32)
CO2: 29 mmol/L (ref 22–32)
Calcium: 11.8 mg/dL — ABNORMAL HIGH (ref 8.9–10.3)
Calcium: 12.1 mg/dL — ABNORMAL HIGH (ref 8.9–10.3)
Chloride: 97 mmol/L — ABNORMAL LOW (ref 98–111)
Chloride: 96 mmol/L — ABNORMAL LOW (ref 98–111)
Creatinine, Ser: 0.63 mg/dL (ref 0.44–1.00)
Creatinine, Ser: 0.77 mg/dL (ref 0.44–1.00)
GFR, Estimated: >60 mL/min (ref >60)
GFR, Estimated: >60 mL/min (ref >60)
Glucose, Bld: 150 mg/dL — ABNORMAL HIGH (ref 70–99)
Glucose, Bld: 153 mg/dL — ABNORMAL HIGH (ref 70–99)
Potassium: 3.4 mmol/L — ABNORMAL LOW (ref 3.5–5.1)
Potassium: 3.5 mmol/L (ref 3.5–5.1)
Sodium: 138 mmol/L (ref 135–145)
Sodium: 138 mmol/L (ref 135–145)

## 2023-05-27 LAB — MRSA NEXT GEN BY PCR, NASAL: MRSA by PCR Next Gen: NOT DETECTED

## 2023-05-27 LAB — GLUCOSE, CAPILLARY
Glucose-Capillary: 151 mg/dL — ABNORMAL HIGH (ref 70–99)
Glucose-Capillary: 160 mg/dL — ABNORMAL HIGH (ref 70–99)
Glucose-Capillary: 175 mg/dL — ABNORMAL HIGH (ref 70–99)
Glucose-Capillary: 160 mg/dL — ABNORMAL HIGH (ref 70–99)
Glucose-Capillary: 140 mg/dL — ABNORMAL HIGH (ref 70–99)

## 2023-05-27 MED ORDER — CHLORHEXIDINE GLUCONATE CLOTH 2 % EX PADS
6.0000 | MEDICATED_PAD | Freq: Every day | CUTANEOUS | Status: DC
  Administered 2023-05-27 – 2023-06-02 (×7): 6 via TOPICAL

## 2023-05-27 MED ORDER — ORAL CARE MOUTH RINSE
15.0000 mL | OROMUCOSAL | Status: DC
  Administered 2023-05-27 – 2023-06-02 (×24): 15 mL via OROMUCOSAL

## 2023-05-27 MED ORDER — ORAL CARE MOUTH RINSE: 15.0000 mL | OROMUCOSAL | Status: DC | PRN

## 2023-05-27 MED ORDER — DOXYCYCLINE HYCLATE 100 MG IV SOLR
100.0000 mg | Freq: Two times a day (BID) | INTRAVENOUS | Status: DC
  Administered 2023-05-27 – 2023-05-30 (×6): 100 mg via INTRAVENOUS
  Filled 2023-05-27 (×7): qty 100

## 2023-05-27 MED ORDER — INSULIN ASPART 100 UNIT/ML IJ SOLN
0.0000 [IU] | INTRAMUSCULAR | Status: DC
  Administered 2023-05-27 – 2023-05-28 (×5): 3 [IU] via SUBCUTANEOUS
  Administered 2023-05-28: 2 [IU] via SUBCUTANEOUS
  Administered 2023-05-28: 3 [IU] via SUBCUTANEOUS
  Administered 2023-05-28: 5 [IU] via SUBCUTANEOUS
  Administered 2023-05-28 – 2023-05-29 (×3): 3 [IU] via SUBCUTANEOUS
  Administered 2023-05-29: 2 [IU] via SUBCUTANEOUS
  Administered 2023-05-29: 8 [IU] via SUBCUTANEOUS
  Administered 2023-05-30 (×2): 2 [IU] via SUBCUTANEOUS

## 2023-05-27 NOTE — Progress Notes (Addendum)
Critical care attending attestation note:  Patient seen and examined and relevant ancillary tests reviewed.  I agree with the assessment and plan of care as outlined by Devra Dopp NP.   67 year old woman history of COPD and chronic hypoxic respiratory failure on 4 L O2 followed by Dr. Elesa Massed in pulmonary clinic presents with acute on chronic hypoxemic respite failure felt related to COPD and possible exacerbation of CHF.  Arrived to the ICU on BiPAP.  Alert.  Interactive.  Appears comfortable.  Minimal FiO2.  Discussed plan with patient of antibiotics and steroids.  May need diuresis, trial of diuresis as well.  She expressed understanding.  Synopsis of assessment and plan:  Acute on chronic respiratory failure with hypercarbia and hypoxemia: Due to presumed COPD exacerbation possible CHF exacerbation in addition. -- Continue IV steroids, add IV antibiotics given on BiPAP and transition to p.o. once improved -- Consider trial of diuresis if not improving -- Continue nebulized bronchodilators  Acute metabolic encephalopathy due to hypercapnia: Improved with BiPAP therapy. -- BiPAP for now, consider transition to nasal cannula once improving  Restless leg syndrome: -- Continue home medicines  CRITICAL CARE Performed by: Karren Burly   Total critical care time: 32 minutes  Critical care time was exclusive of separately billable procedures and treating other patients.  Critical care was necessary to treat or prevent imminent or life-threatening deterioration.  Critical care was time spent personally by me on the following activities: development of treatment plan with patient and/or surrogate as well as nursing, discussions with consultants, evaluation of patient's response to treatment, examination of patient, obtaining history from patient or surrogate, ordering and performing treatments and interventions, ordering and review of laboratory studies, ordering and review of  radiographic studies, pulse oximetry, re-evaluation of patient's condition and participation in multidisciplinary rounds.  Vilma Meckel, MD  ICU Physician [facility] Critical Care and Pulmonary Medicine  See Amion for pager info  05/27/2023, 11:54 AM      NAME:  [name], MRN:  [id number], DOB:  21-Apr-1956, LOS: 1 ADMISSION DATE:  05/26/2023, CONSULTATION DATE:  05/26/23 REFERRING MD:  Elgergawy CHIEF COMPLAINT:  Dyspnea   History of Present Illness:  [name] is a 67 y.o. female who has a PMH as below including but not limited to COPD with chronic hypoxic respiratory failure on 4L O2 (Followed by Dr. Sherene Sires, PFTS from 2018 with FEV1 51% pred), recent diagnosis LUL NSCLC s/p XRT, ongoing tobacco dependence polysubstance abuse (last used cocaine roughly 1 week PTA), dCHF, DM.  She presented to [facility] ED 11/21 with worsening dyspnea for a few days and increase in O2 from baseline 4L up to 6L with ongoing hypoxia in the 70s. She apparently had some cough and congestion at the time as well.  In ED, she was found to have hypoxic and hypercapnic respiratory failure. CXR was negative for acute process. Repeat VBG had worsening hypercapnia; therefore, she was started on BiPAP and was transferred to [facility] for further management.  After arrival to [facility], she was initially a bit somnolent so PCCM was called to see her in consultation. At the time of our arrival, she has very active RLS (has this at baseline). She is awake, able to answer basic questions, follow basic commands. Shortly after our departure, nurse informed us that she was slightly agitated and intermittently attempting to remove mask while getting out of bed.  Pertinent  Medical History:  has COPD GOLD 2/ still smoker ; Panic disorder; Tobacco use  disorder; Hyperlipidemia; Benzodiazepine dependence (HCC); Mild dysplasia of cervix; Essential hypertension; History of colonic polyps; COPD exacerbation (HCC); GERD  without esophagitis; Bipolar 1 disorder (HCC); Anxiety; Polysubstance abuse (HCC); Obesity (BMI 30-39.9); Acute metabolic encephalopathy; Dysphagia, pharyngoesophageal phase; Hx of colonic polyps; Abdominal pain, chronic, epigastric; Low grade squamous intraepithelial lesion (LGSIL) on Papanicolaou smear of cervix; Anorgasmia of female; Alcohol abuse; Chronic pain; Hyperglycemia; DM (diabetes mellitus), type 2 (HCC); Respiratory bronchiolitis associated interstitial lung disease (HCC); Ganglion cyst of dorsum of left wrist s/p removal 09/07/18; Airway malacia; Choking episode; Chronic hypoxemic respiratory failure (HCC); Degeneration of lumbar intervertebral disc; Lumbar degenerative disc disease; Hallucinations; Major depressive disorder, single episode, unspecified; Other long term (current) drug therapy; Severe recurrent major depression without psychotic features (HCC); Lumbar radiculopathy; Lumbar spondylosis; Hip pain; Encounter for gynecological examination with Papanicolaou smear of cervix; Encounter for screening fecal occult blood testing; Cigarette smoker; Hyponatremia; Elevated troponin; Leukocytosis; NSTEMI (non-ST elevated myocardial infarction) (HCC); Pressure injury of skin; Diastolic CHF (HCC); Oral candidiasis; Breast mass; Malignant neoplasm of upper lobe of left lung (HCC); Parotid nodule; Closed fracture of distal end of right radius; SIRS (systemic inflammatory response syndrome) (HCC); Wounds and injuries; Acute myocardial infarction [facility]); Angina pectoris (HCC); Chest pain; Epigastric pain; Wheezing; Vitamin D deficiency; Synovial cyst of left knee; Spinal stenosis of lumbar region; Recurrent falls; Polyneuropathy due to type 2 diabetes mellitus (HCC); Oxygen dependent; Polyneuropathy; Muscle weakness; Mood disorder with manic features due to general medical condition; Lumbago with sciatica; Localized edema; Insomnia; History of recurrent pneumonia; Hypercalcemia; Dysuria; Cough; Bipolar  disorder in partial remission (HCC); Abnormal finding on evaluation procedure; Actinic keratosis; Atherosclerosis of coronary artery without angina pectoris; Long-term current use of opiate analgesic; Abnormal cytological findings in specimens from other female genital organs; Disorder of trachea or bronchus; and Acute on chronic respiratory failure with hypoxia and hypercapnia (HCC) on their problem list.  Significant Hospital Events: Including procedures, antibiotic start and stop dates in addition to other pertinent events   11/21 admit.  Interim History / Subjective:  Not tolerating BiPAP transferred to intensive care unit  Objective:  Blood pressure 136/69, pulse (!) 126, temperature 98.1 F (36.7 C), temperature source Axillary, resp. rate (!) 28, height 5\' 4"  (1.626 m), weight 72.5 kg, SpO2 97%.    Vent Mode: BIPAP FiO2 (%):  [30 %-50 %] 35 % Set Rate:  [14 bmp] 14 bmp PEEP:  [8 cmH20-10 cmH20] 8 cmH20 Pressure Support:  [16 cmH20-20 cmH20] 18 cmH20   Intake/Output Summary (Last 24 hours) at 05/27/2023 0939 Last data filed at 05/27/2023 0600 Gross per 24 hour  Intake 474.47 ml  Output --  Net 474.47 ml   Filed Weights   05/26/23 1329 05/26/23 1954  Weight: 80 kg 72.5 kg    Examination: Adult female looks older than stated age currently on BiPAP very difficult to arouse.  When taken off BiPAP she instantly becomes hypoxic No JVD lymphadenopathy is appreciated Poor air movement Heart sounds are regular Extremities with edema   Assessment & Plan:   Acute on chronic hypoxic and hypercapnic respiratory failure - presumed 2/2 AECOPD. Failed BiPAP with increasing decreased level of consciousness She has not been sedated at this time She may need intubation in near future O2 as needed Pulmonary toilet 05/27/2023 transferred to intensive care unit   AECOPD. Ongoing tobacco dependence. Continue bronchodilators and steroids Empirical antimicrobial therapy Needs to  stop smoking       Recent diagnosis LUL NSCLC s/p XRT. Follow-up with oncology as outpatient  Acute metabolic encephalopathy - 2/2 #1. Ammonia levels only 25  Hx RLS. Continue PTA Ropinirole.   Polysubstance abuse - husband reported recent cocaine use about 1 week PTA. UDS on admit negative. Cessation advised should she survive this admission     Best practice (evaluated daily):  Per primary team.  Labs   CBC: Recent Labs  Lab 05/26/23 1401 05/27/23 0339  WBC 13.0* 6.6  HGB 10.2* 9.5*  HCT 33.6* 30.4*  MCV 96.0 93.8  PLT 232 215    Basic Metabolic Panel: Recent Labs  Lab 05/26/23 1401 05/27/23 0339  NA 134* 138  K 4.3 3.4*  CL 93* 97*  CO2 33* 31  GLUCOSE 127* 150*  BUN 7* 7*  CREATININE 0.44 0.63  CALCIUM 11.3* 11.8*   GFR: Estimated Creatinine Clearance: 65.7 mL/min (by C-G formula based on SCr of 0.63 mg/dL). Recent Labs  Lab 05/26/23 1401 05/27/23 0339  WBC 13.0* 6.6    Liver Function Tests: Recent Labs  Lab 05/26/23 1401  AST 28  ALT 16  ALKPHOS 72  BILITOT 1.0  PROT 7.0  ALBUMIN 3.3*   No results for input(s): "LIPASE", "AMYLASE" in the last 168 hours. No results for input(s): "AMMONIA" in the last 168 hours.  ABG    Component Value Date/Time   PHART 7.27 (L) 05/26/2023 2015   PCO2ART 76 (HH) 05/26/2023 2015   PO2ART 94 05/26/2023 2015   HCO3 34.9 (H) 05/26/2023 2015   TCO2 20.7 07/05/2014 1122   ACIDBASEDEF 0.7 07/05/2014 1122   O2SAT 97.5 05/26/2023 2015     Coagulation Profile: No results for input(s): "INR", "PROTIME" in the last 168 hours.  Cardiac Enzymes: No results for input(s): "CKTOTAL", "CKMB", "CKMBINDEX", "TROPONINI" in the last 168 hours.  HbA1C: Hgb A1c MFr Bld  Date/Time Value Ref Range Status  04/17/2023 04:51 AM 6.3 (H) 4.8 - 5.6 % Final    Comment:    (NOTE) Pre diabetes:          5.7%-6.4%  Diabetes:              >6.4%  Glycemic control for   <7.0% adults with diabetes   09/23/2022  11:13 AM 5.9 (H) 4.8 - 5.6 % Final    Comment:    (NOTE)         Prediabetes: 5.7 - 6.4         Diabetes: >6.4         Glycemic control for adults with diabetes: <7.0     CBG: No results for input(s): "GLUCAP" in the last 168 hours.   Brett Canales Minor ACNP Acute Care Nurse Practitioner [facility] Pulmonary/Critical Care Please consult Amion 05/27/2023, 9:39 AM

## 2023-05-27 NOTE — Progress Notes (Signed)
PT Cancellation Note  Patient Details Name: [name] MRN: [id number] DOB: 1956-06-11   Cancelled Treatment:    Reason Eval/Treat Not Completed: (P) Patient not medically ready;Fatigue/lethargy limiting ability to participate. Pt very lethargic and appears to be having some labored breathing on Bi-PAP. Discussed pt with RN, who recommended hold on PT this date. Will plan to follow-up another day as able.   Virgil Benedict, PT, DPT Acute Rehabilitation Services  Office: [phone]    Bettina Gavia 05/27/2023, 8:36 AM

## 2023-05-27 NOTE — Progress Notes (Signed)
Pt bladder scan at 0400 was . Dr. Loney Loh paged with value, and in-N-out cath ordered. Attempted twice by 2 RNs unsuccessfully. Provider updated and no new orders received. Dr. Ella Jubilee and Day RN Lorrin Jackson also updated.

## 2023-05-27 NOTE — Progress Notes (Signed)
Bipap pt transported from 3E18 to 3M05 without any issues.

## 2023-05-27 NOTE — Progress Notes (Signed)
   05/27/23 1919  BiPAP/CPAP/SIPAP  BiPAP/CPAP/SIPAP Pt Type Adult  BiPAP/CPAP/SIPAP SERVO  Mask Type Full face mask  Mask Size Small  Set Rate 20 breaths/min  Respiratory Rate 25 breaths/min  Pressure Support 10 cmH20 (PC)  PEEP 5 cmH20  FiO2 (%) 40 %  Minute Ventilation 11.6  Leak 21  Peak Inspiratory Pressure (PIP) 16  Tidal Volume (Vt) 519  Patient Home Equipment No  Auto Titrate No  Press High Alarm 40 cmH2O  CPAP/SIPAP surface wiped down Yes

## 2023-05-27 NOTE — Plan of Care (Signed)
  Problem: Education: Goal: Knowledge of General Education information will improve Description: Including pain rating scale, medication(s)/side effects and non-pharmacologic comfort measures Outcome: Not Progressing   

## 2023-05-27 NOTE — Plan of Care (Signed)
  Problem: Clinical Measurements: Goal: Will remain free from infection Outcome: Progressing Goal: Diagnostic test results will improve Outcome: Progressing Goal: Cardiovascular complication will be avoided Outcome: Progressing   Problem: Clinical Measurements: Goal: Respiratory complications will improve Outcome: Not Progressing   Problem: Nutrition: Goal: Adequate nutrition will be maintained Outcome: Not Progressing

## 2023-05-28 DIAGNOSIS — J9622 Acute and chronic respiratory failure with hypercapnia: Secondary | ICD-10-CM | POA: Diagnosis not present

## 2023-05-28 DIAGNOSIS — J9621 Acute and chronic respiratory failure with hypoxia: Secondary | ICD-10-CM | POA: Diagnosis not present

## 2023-05-28 LAB — GLUCOSE, CAPILLARY
Glucose-Capillary: 202 mg/dL — ABNORMAL HIGH (ref 70–99)
Glucose-Capillary: 138 mg/dL — ABNORMAL HIGH (ref 70–99)
Glucose-Capillary: 159 mg/dL — ABNORMAL HIGH (ref 70–99)
Glucose-Capillary: 165 mg/dL — ABNORMAL HIGH (ref 70–99)
Glucose-Capillary: 159 mg/dL — ABNORMAL HIGH (ref 70–99)
Glucose-Capillary: 165 mg/dL — ABNORMAL HIGH (ref 70–99)

## 2023-05-28 MED ORDER — METOPROLOL TARTRATE 5 MG/5ML IV SOLN
5.0000 mg | Freq: Three times a day (TID) | INTRAVENOUS | Status: DC
  Administered 2023-05-28 – 2023-05-30 (×5): 5 mg via INTRAVENOUS
  Filled 2023-05-28 (×5): qty 5

## 2023-05-28 MED ORDER — METOPROLOL TARTRATE 25 MG PO TABS: 25.0000 mg | ORAL_TABLET | Freq: Two times a day (BID) | ORAL | Status: DC

## 2023-05-28 NOTE — Progress Notes (Addendum)
eLink Physician-Brief Progress Note Patient Name: [name] DOB: 05-Mar-1956 MRN: [id number]   Date of Service  05/28/2023  HPI/Events of Note  Elevated HR and bp.  Sinus rhythm  eICU Interventions  Start scheduled beta blocker     Intervention Category Intermediate Interventions: Hypertension - evaluation and management  Henry Russel, P 05/28/2023, 8:27 PM

## 2023-05-28 NOTE — Evaluation (Signed)
Physical Therapy Evaluation Patient Details Name: [name] MRN: [id number] DOB: 07-06-1955 Today's Date: 05/28/2023  History of Present Illness  67 yo female admitted 11/21 with dyspnea and repiratory failure requiring bipap. PMhx: COPD on home 3-4L, CHF, DM, smoker, polysubstance abuse, bipolar, RLS, HTN, HLD, LUL NSCLC s/p XRT.  Clinical Impression  Pt pleasant and wanting to get OOB but demonstrates decreased cognition, safety awareness, function and mobility. Pt walks without assist at baseline and spouse helps with bath if needed. Pt with fatigue and tachycardia (HR 130-174 with very limited mobility) prohibiting walking or further mobility progression at this time. Spouse able to assist and anticipate home  with HHPT if HR and SPO2 improve. Pt with decreased strength, activity tolerance and cardiopulmonary function who will benefit from acute therapy to maximize mobility and safety.   SPO2 94% on 4L on arrival, desaturation to 86% with limited effort sitting EOB with bump to 6L and SPO2 90% on 6L with activity. Desaturation and tachycardia with coughing      If plan is discharge home, recommend the following: A little help with bathing/dressing/bathroom;A little help with walking and/or transfers;Direct supervision/assist for medications management;Assist for transportation;Help with stairs or ramp for entrance   Can travel by private vehicle        Equipment Recommendations None recommended by PT  Recommendations for Other Services       Functional Status Assessment Patient has had a recent decline in their functional status and demonstrates the ability to make significant improvements in function in a reasonable and predictable amount of time.     Precautions / Restrictions Precautions Precautions: Fall;Other (comment) Precaution Comments: watch sats and HR      Mobility  Bed Mobility Overal bed mobility: Needs Assistance Bed Mobility: Supine to Sit      Supine to sit: Supervision     General bed mobility comments: cues to initiate and for supervision of lines    Transfers Overall transfer level: Needs assistance   Transfers: Sit to/from Stand Sit to Stand: Contact guard assist           General transfer comment: CGA to rise from bed x 2 and from recliner x1 with cues for safety as pt will stand without stating anything and needs assist for lines, sits prematurely at edge of surface. Step pivot to chair due to tachycardia unable to progress further HR 135-174 with limited standing and maintained 130-150    Ambulation/Gait               General Gait Details: not yet able  Stairs            Wheelchair Mobility     Tilt Bed    Modified Rankin (Stroke Patients Only)       Balance Overall balance assessment: Needs assistance Sitting-balance support: Bilateral upper extremity supported, Feet supported Sitting balance-Leahy Scale: Fair Sitting balance - Comments: EOB with UB support   Standing balance support: Bilateral upper extremity supported, Reliant on assistive device for balance Standing balance-Leahy Scale: Poor Standing balance comment: Rw in standing                             Pertinent Vitals/Pain Pain Assessment Pain Assessment: No/denies pain    Home Living Family/patient expects to be discharged to:: Private residence Living Arrangements: Spouse/significant other Available Help at Discharge: Family;Available 24 hours/day Type of Home: Mobile home Home Access: Stairs to enter Entrance Stairs-Rails: Right;Left;Can  reach both Entrance Stairs-Number of Steps: 2   Home Layout: One level Home Equipment: Pharmacist, hospital (2 wheels);Rollator (4 wheels) Additional Comments: wears 4L O2 baseline    Prior Function Prior Level of Function : Independent/Modified Independent             Mobility Comments: Typically independent without DME, intermittent use of RW if  needed. ADLs Comments: Assisted with showers     Extremity/Trunk Assessment   Upper Extremity Assessment Upper Extremity Assessment: Generalized weakness    Lower Extremity Assessment Lower Extremity Assessment: Generalized weakness    Cervical / Trunk Assessment Cervical / Trunk Assessment: Kyphotic  Communication   Communication Communication: No apparent difficulties  Cognition Arousal: Alert Behavior During Therapy: Flat affect Overall Cognitive Status: Impaired/Different from baseline Area of Impairment: Orientation, Attention, Memory, Following commands, Safety/judgement, Problem solving                 Orientation Level: Disoriented to, Time, Situation Current Attention Level: Sustained Memory: Decreased short-term memory Following Commands: Follows one step commands inconsistently, Follows one step commands with increased time Safety/Judgement: Decreased awareness of deficits, Decreased awareness of safety   Problem Solving: Slow processing, Decreased initiation, Requires verbal cues          General Comments      Exercises     Assessment/Plan    PT Assessment Patient needs continued PT services  PT Problem List Decreased strength;Decreased mobility;Decreased safety awareness;Decreased activity tolerance;Cardiopulmonary status limiting activity;Decreased cognition;Decreased knowledge of use of DME       PT Treatment Interventions DME instruction;Gait training;Stair training;Functional mobility training;Therapeutic activities;Patient/family education;Therapeutic exercise;Balance training    PT Goals (Current goals can be found in the Care Plan section)  Acute Rehab PT Goals Patient Stated Goal: return home PT Goal Formulation: With patient/family Time For Goal Achievement: 06/11/23 Potential to Achieve Goals: Good    Frequency Min 1X/week     Co-evaluation               AM-PAC PT "6 Clicks" Mobility  Outcome Measure Help needed  turning from your back to your side while in a flat bed without using bedrails?: None Help needed moving from lying on your back to sitting on the side of a flat bed without using bedrails?: None Help needed moving to and from a bed to a chair (including a wheelchair)?: A Little Help needed standing up from a chair using your arms (e.g., wheelchair or bedside chair)?: A Little Help needed to walk in hospital room?: A Lot Help needed climbing 3-5 steps with a railing? : Total 6 Click Score: 17    End of Session Equipment Utilized During Treatment: Oxygen Activity Tolerance: Patient tolerated treatment well Patient left: in chair;with call bell/phone within reach;with family/visitor present;with chair alarm set Nurse Communication: Mobility status PT Visit Diagnosis: Other abnormalities of gait and mobility (R26.89);Muscle weakness (generalized) (M62.81)    Time: 1610-9604 PT Time Calculation (min) (ACUTE ONLY): 20 min   Charges:   PT Evaluation $PT Eval Moderate Complexity: 1 Mod   PT General Charges $$ ACUTE PT VISIT: 1 Visit         Merryl Hacker, PT Acute Rehabilitation Services Office: [phone]   Enedina Finner Raul Winterhalter 05/28/2023, 1:46 PM

## 2023-05-28 NOTE — Evaluation (Signed)
Clinical/Bedside Swallow Evaluation Patient Details  Name: [name] MRN: [id number] Date of Birth: 11/20/1955  Today's Date: 05/28/2023 Time: SLP Start Time (ACUTE ONLY): 1152 SLP Stop Time (ACUTE ONLY): 1220 SLP Time Calculation (min) (ACUTE ONLY): 28 min  Past Medical History:  Past Medical History:  Diagnosis Date   Anemia    Anxiety    Arthritis    Bipolar 1 disorder (HCC)    CHF (congestive heart failure) (HCC)    COPD (chronic obstructive pulmonary disease) (HCC)    Depression    Diabetes mellitus without complication (HCC)    Dyspnea    Emphysema of lung (HCC)    GERD (gastroesophageal reflux disease)    Headache    migraines   History of hiatal hernia    History of kidney stones    Hyperlipidemia    Hypertension    Neuromuscular disorder (HCC)    neuropathy   On home oxygen therapy    Pneumonia 2015, 2019   Tracheomalacia    Vaginal Pap smear, abnormal    Past Surgical History:  Past Surgical History:  Procedure Laterality Date   CATARACT EXTRACTION W/PHACO Left 01/14/2020   Procedure: CATARACT EXTRACTION PHACO AND INTRAOCULAR LENS PLACEMENT LEFT EYE;  Surgeon: Fabio Pierce, MD;  Location: [facility];  Service: Ophthalmology;  Laterality: Left;  CDE: 8.18   CATARACT EXTRACTION W/PHACO Right 02/01/2020   Procedure: CATARACT EXTRACTION PHACO AND INTRAOCULAR LENS PLACEMENT RIGHT EYE;  Surgeon: Fabio Pierce, MD;  Location: [facility];  Service: Ophthalmology;  Laterality: Right;  CDE: 9.94   CHEILECTOMY Right 01/28/2020   Procedure: KELLER BUNION IMPLANT RIGHT FOOT CHEILECTOMY RIGHT ROOT;  Surgeon: Asencion Islam, DPM;  Location: [facility];  Service: Podiatry;  Laterality: Right;  BLOCK   CHOLECYSTECTOMY     COLONOSCOPY  July 2010   Dr. Lovell Sheehan: 3 rectal polyps, not enough tissue for pathologic examination, recommended surveillance in 3 years   COLONOSCOPY N/A 10/23/2014   RMR: Multiple colonic polyps removed as described above. No endoscopic explaniation for  abdominal pain. however. next tcs 10/2019   ESOPHAGOGASTRODUODENOSCOPY  July 2010   Dr. Lovell Sheehan: gastritis and duodenitis, H.pylori negative   ESOPHAGOGASTRODUODENOSCOPY N/A 10/23/2014   RMR: Normal EGD. Status post passage of a Maloney dilator. Today's finding s would not explain abdominal pain   EYE SURGERY Left 01/14/2020   cataract removal   GANGLION CYST EXCISION Left 09/07/2018   Procedure: REMOVAL GANGLION OF WRIST;  Surgeon: Vickki Hearing, MD;  Location: [facility];  Service: Orthopedics;  Laterality: Left;   HERNIA REPAIR     Dr. Lovell Sheehan   KIDNEY STONE SURGERY     MALONEY DILATION N/A 10/23/2014   Procedure: Elease Hashimoto DILATION;  Surgeon: Corbin Ade, MD;  Location: [facility];  Service: Endoscopy;  Laterality: N/A;   OPEN REDUCTION INTERNAL FIXATION (ORIF) DISTAL RADIAL FRACTURE Right 12/09/2015   Procedure: OPEN REDUCTION INTERNAL FIXATION (ORIF) RIGHT DISTAL RADIUS;  Surgeon: Betha Loa, MD;  Location:  SURGERY CENTER;  Service: Orthopedics;  Laterality: Right;   HPI:  Pt is a 67 y.o. female presented to [facility] ED 11/21 with worsening dyspnea, cough/congestion and increase in O2 from baseline 4L up to 6L with ongoing hypoxia in the 70s. In ED, she was found to have hypoxic and hypercapnic respiratory failure. CXR negative for acute process. Repeat VBG had worsening hypercapnia; therefore, she was started on BiPAP and was transferred to [facility] for further management. Esophagram (03/28/23) revealed "Laryngeal penetration to the level of the  vocal cords. Stasis in the vallecula. Loss of the primary esophageal wave and increased esophageal stasis with the patient in the prone position". Pt was scheduled for MBSS (05/09/23), but did not show. PMH: COPD with chronic hypoxic respiratory failure on 4L O2, recent diagnosis LUL NSCLC s/p XRT, ongoing tobacco dependence polysubstance abuse (last used cocaine roughly 1 week PTA), dCHF, DM, GERD.    Assessment / Plan / Recommendation  Clinical  Impression  Pt presents with clinical s/sx of oropharyngeal dysphagia at bedside. Husband, present for eval, reports inconsistent coughing with thin liquids at baseline. He notes that sometimes she will go a day or so without any issues and then start having difficulty. Pt alert on 4L [place] with O2 saturations in low-mid 90s. Pt on BiPAP this am and tolerating Patrick, but respiratory status is tenuous per RT/RN. Oral mechanism examination this date signficant for likely generalized oral weakness, pt following majority of commands. Ice chips x3 were actively manipulated and cleared from oral cavity, no clinical signs of aspiration. Thin liquids by straw/cup, small and larger/consecutive swallows were significant for delayed coughing moreso with larger boluses. NTL by cup appeared to eliminate s/sx. Puree and regular textured solids were WFL, expect for some prolonged mastication of regular solid likely due to lacking dentition and overall deconditioning. Discussed clinical presentation with RT/RN who are concerned regarding her fluctuating respiratory status and frequent need for BiPAP. Recommend initiate ice chips after oral care PRN, with completion of MBS when pt is medically ready.  SLP Visit Diagnosis: Dysphagia, unspecified (R13.10)    Aspiration Risk       Diet Recommendation NPO;Ice chips PRN after oral care    Liquid Administration via: Spoon Medication Administration: Via alternative means Supervision: Full supervision/cueing for compensatory strategies;Staff to assist with self feeding Compensations: Minimize environmental distractions;Slow rate;Small sips/bites Postural Changes: Seated upright at 90 degrees    Other  Recommendations Oral Care Recommendations: Oral care prior to ice chip/H20    Recommendations for follow up therapy are one component of a multi-disciplinary discharge planning process, led by the attending physician.  Recommendations may be updated based on patient status,  additional functional criteria and insurance authorization.  Follow up Recommendations  (TBD)      Assistance Recommended at Discharge    Functional Status Assessment Patient has had a recent decline in their functional status and demonstrates the ability to make significant improvements in function in a reasonable and predictable amount of time.  Frequency and Duration min 2x/week  2 weeks       Prognosis Prognosis for improved oropharyngeal function: Fair Barriers to Reach Goals: Time post onset;Cognitive deficits      Swallow Study   General Date of Onset: 05/27/23 HPI: Pt is a 67 y.o. female presented to [facility] ED 11/21 with worsening dyspnea, cough/congestion and increase in O2 from baseline 4L up to 6L with ongoing hypoxia in the 70s. In ED, she was found to have hypoxic and hypercapnic respiratory failure. CXR negative for acute process. Repeat VBG had worsening hypercapnia; therefore, she was started on BiPAP and was transferred to [facility] for further management. Esophagram (03/28/23) revealed "Laryngeal penetration to the level of the vocal cords. Stasis in the vallecula. Loss of the primary esophageal wave and increased esophageal stasis with the patient in the prone position". Pt was scheduled for MBSS (05/09/23), but did not show. PMH: COPD with chronic hypoxic respiratory failure on 4L O2, recent diagnosis LUL NSCLC s/p XRT, ongoing tobacco dependence polysubstance abuse (last used  cocaine roughly 1 week PTA), dCHF, DM, GERD. Type of Study: Bedside Swallow Evaluation Previous Swallow Assessment: see HPI Diet Prior to this Study: NPO Temperature Spikes Noted: Yes (100) Respiratory Status: Nasal cannula History of Recent Intubation: No Behavior/Cognition: Alert;Cooperative;Pleasant mood;Confused Oral Cavity Assessment: Dried secretions Oral Cavity - Dentition: Dentures, top Vision: Functional for self-feeding Self-Feeding Abilities: Able to feed self;Needs assist Patient Positioning:  Upright in bed;Postural control adequate for testing Baseline Vocal Quality: Hoarse Volitional Cough: Weak;Congested Volitional Swallow: Able to elicit    Oral/Motor/Sensory Function Overall Oral Motor/Sensory Function: Generalized oral weakness   Ice Chips Ice chips: Within functional limits Presentation: Spoon   Thin Liquid Thin Liquid: Impaired Presentation: Cup;Straw;Self Fed Pharyngeal  Phase Impairments: Throat Clearing - Delayed;Cough - Delayed;Suspected delayed Swallow    Nectar Thick Nectar Thick Liquid: Within functional limits Presentation: Cup;Self Fed   Honey Thick Honey Thick Liquid: Not tested   Puree Puree: Within functional limits Presentation: Self Fed;Spoon   Solid     Solid: Impaired Presentation: Self Fed Oral Phase Impairments: Impaired mastication Oral Phase Functional Implications: Impaired mastication;Prolonged oral transit       Avie Echevaria, MA, CCC-SLP Acute Rehabilitation Services Office Number: [phone]  Paulette Blanch 05/28/2023,12:46 PM

## 2023-05-28 NOTE — Plan of Care (Signed)
  Problem: Education: Goal: Knowledge of General Education information will improve Description Including pain rating scale, medication(s)/side effects and non-pharmacologic comfort measures Outcome: Progressing   

## 2023-05-28 NOTE — Progress Notes (Signed)
NAME:  [name], MRN:  [id number], DOB:  26-Jan-1956, LOS: 2 ADMISSION DATE:  05/26/2023, CONSULTATION DATE:  05/26/23 REFERRING MD:  Elgergawy CHIEF COMPLAINT:  Dyspnea   History of Present Illness:  [name] is a 67 y.o. female who has a PMH as below including but not limited to COPD with chronic hypoxic respiratory failure on 4L O2 (Followed by Dr. Sherene Sires, PFTS from 2018 with FEV1 51% pred), recent diagnosis LUL NSCLC s/p XRT, ongoing tobacco dependence polysubstance abuse (last used cocaine roughly 1 week PTA), dCHF, DM.  She presented to [facility] ED 11/21 with worsening dyspnea for a few days and increase in O2 from baseline 4L up to 6L with ongoing hypoxia in the 70s. She apparently had some cough and congestion at the time as well.  In ED, she was found to have hypoxic and hypercapnic respiratory failure. CXR was negative for acute process. Repeat VBG had worsening hypercapnia; therefore, she was started on BiPAP and was transferred to [facility] for further management.  After arrival to [facility], she was initially a bit somnolent so PCCM was called to see her in consultation. At the time of our arrival, she has very active RLS (has this at baseline). She is awake, able to answer basic questions, follow basic commands. Shortly after our departure, nurse informed us that she was slightly agitated and intermittently attempting to remove mask while getting out of bed.  Pertinent  Medical History:   Past Medical History:  Diagnosis Date   Anemia    Anxiety    Arthritis    Bipolar 1 disorder (HCC)    CHF (congestive heart failure) (HCC)    COPD (chronic obstructive pulmonary disease) (HCC)    Depression    Diabetes mellitus without complication (HCC)    Dyspnea    Emphysema of lung (HCC)    GERD (gastroesophageal reflux disease)    Headache    migraines   History of hiatal hernia    History of kidney stones    Hyperlipidemia    Hypertension    Neuromuscular disorder  (HCC)    neuropathy   On home oxygen therapy    Pneumonia 2015, 2019   Tracheomalacia    Vaginal Pap smear, abnormal     Significant Hospital Events: Including procedures, antibiotic start and stop dates in addition to other pertinent events   11/21 admit. 11/23 remains on BiPAP this a.m. with more wakefulness  Interim History / Subjective:  Denies any acute complaints this a.m., husband updated at bedside  Objective:  Blood pressure 119/83, pulse (!) 114, temperature 99.6 F (37.6 C), temperature source Axillary, resp. rate (!) 23, height 5\' 4"  (1.626 m), weight 72.5 kg, SpO2 98%.    Vent Mode: PCV;BIPAP FiO2 (%):  [40 %] 40 % Set Rate:  [20 bmp] 20 bmp PEEP:  [5 cmH20] 5 cmH20 Pressure Support:  [10 cmH20] 10 cmH20   Intake/Output Summary (Last 24 hours) at 05/28/2023 0754 Last data filed at 05/28/2023 7829 Gross per 24 hour  Intake 776.29 ml  Output 1535 ml  Net -758.71 ml   Filed Weights   05/26/23 1329 05/26/23 1954  Weight: 80 kg 72.5 kg    Examination: General: Acute on chronic ill-appearing middle-aged female lying on BiPAP n no acute distress HEENT: BiPAP mask in place, MM pink/moist, PERRL,  Neuro: Will open eyes to verbal stimuli and follows simple commands CV: s1s2 regular rate and rhythm, no murmur, rubs, or gallops,  PULM: Faint  expiratory wheeze, no increased work of breathing, no added breath sounds, tolerating ventilator GI: soft, bowel sounds active in all 4 quadrants, non-tender, non-distended Extremities: warm/dry, no edema  Skin: no rashes or lesions   Assessment & Plan:   Acute on chronic hypoxic and hypercapnic respiratory failure presumed secondary to acute exacerbation of chronic COPD -Failed BiPAP with increasing decreased level of consciousness -Lyses 4 L nasal cannula at baseline Recent diagnosis LUL NSCLC s/p XRT. P: Trial off BiPAP this a.m. If mentation decreases reapply BiPAP Continue home supplemental oxygen 4 L nasal  cannula Continue bronchodilators continue steroids Empiric antibiotics Outpatient oncology follow-up Smoking cessation education when appropriate Aspiration precautions  Acute metabolic encephalopathy secondary to above  P: Improved this a.m. with BiPAP Avoid sedation Delirium precautions Mobilize as able  Hx RLS P: Continue home Ropinirole  Polysubstance abuse  -Husband reported recent cocaine use about 1 week PTA. UDS on admit negative P: Cessation education when appropriate  Type 2 diabetes -Home meds include metformin P: Hold home medications SSI CBG goal 140-180 CBG checks every 4  History of combined systolic and diastolic heart failure most recent echo reveals normalization of function -Echo March 2024 EF 65 to 70%, no WMA, normal RV function Essential hypertension HLD  -Home medications include Norvasc and Crestor P: Continuous telemetry Resume home medications when appropriate Intake and output  Severe depression -Full medication includes Cymbalta and Olanzapine P: Resume home medications when appropriate  Best Practice (right click and "Reselect all SmartList Selections" daily)   Diet/type: NPO DVT prophylaxis: enoxaparin (LOVENOX) injection 40 mg Start: 05/27/23 1000 Pressure ulcer(s): not present on admission  GI prophylaxis: PPI Lines: N/A Foley:  Yes, and it is no longer needed Code Status:  full code Last date of multidisciplinary goals of care discussion: Continue ongoing care  CRITICAL CARE Performed by: Allante Beane D. Harris  Total critical care time: 38 minutes  Critical care time was exclusive of separately billable procedures and treating other patients.  Critical care was necessary to treat or prevent imminent or life-threatening deterioration.  Critical care was time spent personally by me on the following activities: development of treatment plan with patient and/or surrogate as well as nursing, discussions with consultants,  evaluation of patient's response to treatment, examination of patient, obtaining history from patient or surrogate, ordering and performing treatments and interventions, ordering and review of laboratory studies, ordering and review of radiographic studies, pulse oximetry and re-evaluation of patient's condition.  Zinia Innocent D. Harris, NP-C [facility] Personal contact information can be found on Amion  If no contact or response made please call [phone] 05/28/2023, 7:58 AM

## 2023-05-29 LAB — BASIC METABOLIC PANEL
Anion gap: 11 (ref 5–15)
BUN: 15 mg/dL (ref 8–23)
CO2: 35 mmol/L — ABNORMAL HIGH (ref 22–32)
Calcium: 13.4 mg/dL (ref 8.9–10.3)
Chloride: 94 mmol/L — ABNORMAL LOW (ref 98–111)
Creatinine, Ser: 0.67 mg/dL (ref 0.44–1.00)
GFR, Estimated: >60 mL/min (ref >60)
Glucose, Bld: 164 mg/dL — ABNORMAL HIGH (ref 70–99)
Potassium: 2.9 mmol/L — ABNORMAL LOW (ref 3.5–5.1)
Sodium: 140 mmol/L (ref 135–145)

## 2023-05-29 LAB — GLUCOSE, CAPILLARY
Glucose-Capillary: 167 mg/dL — ABNORMAL HIGH (ref 70–99)
Glucose-Capillary: 143 mg/dL — ABNORMAL HIGH (ref 70–99)
Glucose-Capillary: 119 mg/dL — ABNORMAL HIGH (ref 70–99)
Glucose-Capillary: 273 mg/dL — ABNORMAL HIGH (ref 70–99)
Glucose-Capillary: 194 mg/dL — ABNORMAL HIGH (ref 70–99)

## 2023-05-29 LAB — COMPREHENSIVE METABOLIC PANEL
ALT: 13 U/L (ref 0–44)
AST: 15 U/L (ref 15–41)
Albumin: 2.9 g/dL — ABNORMAL LOW (ref 3.5–5.0)
Alkaline Phosphatase: 69 U/L (ref 38–126)
Anion gap: 11 (ref 5–15)
BUN: 17 mg/dL (ref 8–23)
CO2: 29 mmol/L (ref 22–32)
Calcium: 13.3 mg/dL (ref 8.9–10.3)
Chloride: 97 mmol/L — ABNORMAL LOW (ref 98–111)
Creatinine, Ser: 0.75 mg/dL (ref 0.44–1.00)
GFR, Estimated: >60 mL/min (ref >60)
Glucose, Bld: 247 mg/dL — ABNORMAL HIGH (ref 70–99)
Potassium: 4.4 mmol/L (ref 3.5–5.1)
Sodium: 137 mmol/L (ref 135–145)
Total Bilirubin: 0.9 mg/dL (ref <1.2)
Total Protein: 5.8 g/dL — ABNORMAL LOW (ref 6.5–8.1)

## 2023-05-29 MED ORDER — METHYLPREDNISOLONE SODIUM SUCC 40 MG IJ SOLR: 40.0000 mg | Freq: Every day | INTRAMUSCULAR | Status: DC

## 2023-05-29 MED ORDER — POTASSIUM CHLORIDE 10 MEQ/100ML IV SOLN
10.0000 meq | INTRAVENOUS | Status: AC
  Administered 2023-05-29 (×8): 10 meq via INTRAVENOUS
  Filled 2023-05-29 (×8): qty 100

## 2023-05-29 MED ORDER — INFLUENZA VAC A&B SURF ANT ADJ 0.5 ML IM SUSY
0.5000 mL | PREFILLED_SYRINGE | INTRAMUSCULAR | Status: AC
  Administered 2023-05-30: 0.5 mL via INTRAMUSCULAR
  Filled 2023-05-29: qty 0.5

## 2023-05-29 MED ORDER — SALINE SPRAY 0.65 % NA SOLN
1.0000 | NASAL | Status: DC | PRN
  Filled 2023-05-29: qty 44

## 2023-05-29 MED ORDER — ENSURE ENLIVE PO LIQD
237.0000 mL | Freq: Two times a day (BID) | ORAL | Status: DC
  Administered 2023-05-30 – 2023-06-02 (×7): 237 mL via ORAL

## 2023-05-29 MED ORDER — SODIUM CHLORIDE 0.9 % IV SOLN: INTRAVENOUS | Status: DC

## 2023-05-29 MED ORDER — PNEUMOCOCCAL 20-VAL CONJ VACC 0.5 ML IM SUSY
0.5000 mL | PREFILLED_SYRINGE | INTRAMUSCULAR | Status: AC
  Administered 2023-05-30: 0.5 mL via INTRAMUSCULAR
  Filled 2023-05-29: qty 0.5

## 2023-05-29 NOTE — Progress Notes (Signed)
   05/29/23 2317  BiPAP/CPAP/SIPAP  $ Non-Invasive Home Ventilator  Subsequent  $ Face Mask Medium Yes  BiPAP/CPAP/SIPAP Pt Type Adult  BiPAP/CPAP/SIPAP DREAMSTATIOND  Mask Type Full face mask  Mask Size Medium  Respiratory Rate 19 breaths/min  IPAP 15 cmH20  EPAP 5 cmH2O  Pressure Support 10 cmH20  PEEP 5 cmH20  FiO2 (%) 45 %  Leak 15  Patient Home Equipment No  Auto Titrate No  CPAP/SIPAP surface wiped down Yes

## 2023-05-29 NOTE — Progress Notes (Signed)
NAME:  [name], MRN:  [id number], DOB:  12/22/1955, LOS: 3 ADMISSION DATE:  05/26/2023, CONSULTATION DATE:  05/26/23 REFERRING MD:  Elgergawy CHIEF COMPLAINT:  Dyspnea   History of Present Illness:  [name] is a 67 y.o. female who has a PMH as below including but not limited to COPD with chronic hypoxic respiratory failure on 4L O2 (Followed by Dr. Sherene Sires, PFTS from 2018 with FEV1 51% pred), recent diagnosis LUL NSCLC s/p XRT, ongoing tobacco dependence polysubstance abuse (last used cocaine roughly 1 week PTA), dCHF, DM.  She presented to [facility] ED 11/21 with worsening dyspnea for a few days and increase in O2 from baseline 4L up to 6L with ongoing hypoxia in the 70s. She apparently had some cough and congestion at the time as well.  In ED, she was found to have hypoxic and hypercapnic respiratory failure. CXR was negative for acute process. Repeat VBG had worsening hypercapnia; therefore, she was started on BiPAP and was transferred to [facility] for further management.  After arrival to [facility], she was initially a bit somnolent so PCCM was called to see her in consultation. At the time of our arrival, she has very active RLS (has this at baseline). She is awake, able to answer basic questions, follow basic commands. Shortly after our departure, nurse informed us that she was slightly agitated and intermittently attempting to remove mask while getting out of bed.  Pertinent  Medical History:   Past Medical History:  Diagnosis Date   Anemia    Anxiety    Arthritis    Bipolar 1 disorder (HCC)    CHF (congestive heart failure) (HCC)    COPD (chronic obstructive pulmonary disease) (HCC)    Depression    Diabetes mellitus without complication (HCC)    Dyspnea    Emphysema of lung (HCC)    GERD (gastroesophageal reflux disease)    Headache    migraines   History of hiatal hernia    History of kidney stones    Hyperlipidemia    Hypertension    Neuromuscular disorder  (HCC)    neuropathy   On home oxygen therapy    Pneumonia 2015, 2019   Tracheomalacia    Vaginal Pap smear, abnormal     Significant Hospital Events: Including procedures, antibiotic start and stop dates in addition to other pertinent events   11/21 admit. 11/23 remains on BiPAP this a.m. with more wakefulness 11/24 on BiPAP this a.m. with no acute issues overnight  Interim History / Subjective:  Seen lying in bed on BiPAP with no acute complaints, spouse at bedside  Objective:  Blood pressure 129/80, pulse 97, temperature 99.4 F (37.4 C), temperature source Axillary, resp. rate 20, height 5\' 4"  (1.626 m), weight 72.5 kg, SpO2 98%.    Vent Mode: PCV;BIPAP FiO2 (%):  [40 %] 40 % Set Rate:  [20 bmp] 20 bmp PEEP:  [5 cmH20] 5 cmH20   Intake/Output Summary (Last 24 hours) at 05/29/2023 0707 Last data filed at 05/29/2023 0600 Gross per 24 hour  Intake 637.97 ml  Output 1190 ml  Net -552.03 ml   Filed Weights   05/26/23 1329 05/26/23 1955  Weight: 80 kg 72.5 kg    Examination: General acute on chronic ill-appearing deconditioned middle-aged female lying in bed in no acute distress HEENT: [place]/AT, MM pink/moist, PERRL,  Neuro: Alert and oriented x 1, sleepy this a.m. CV: s1s2 regular rate and rhythm, no murmur, rubs, or gallops,  PULM: BiPAP,  slightly diminished air entry bilaterally, no increased work of breathing GI: soft, bowel sounds active in all 4 quadrants, non-tender, non-distended, Extremities: warm/dry, no edema  Skin: no rashes or lesions  Assessment & Plan:   Acute on chronic hypoxic and hypercapnic respiratory failure presumed secondary to acute exacerbation of chronic COPD -Failed BiPAP with increasing decreased level of consciousness -Lyses 4 L nasal cannula at baseline Recent diagnosis LUL NSCLC s/p XRT. P: Continue BiPAP at night and during rest Continue home supplemental oxygen 4 L nasal cannula Continue bronchodilators Continue IV steroids until  evaluated by SLP for swallow safety Empiric antibiotics Outpatient oncology follow-up Smoking cessation when appropriate Aspiration precautions  Acute metabolic encephalopathy secondary to above - Improved P: Avoid sedation Delirium precautions Mobilize as able  Hx RLS P: Continue home Ropinriole  Polysubstance abuse  -Husband reported recent cocaine use about 1 week PTA. UDS on admit negative P: Cessation education when appropriate  Type 2 diabetes -Home meds include metformin P: Continue SSI CBG goal 140-180 CBG checks every 4  History of combined systolic and diastolic heart failure most recent echo reveals normalization of function -Echo March 2024 EF 65 to 70%, no WMA, normal RV function Essential hypertension HLD  -Home medications include Norvasc and Crestor P: Continuous telemetry Resume home medications when appropriate Intake and output  Severe depression -Full medication includes Cymbalta and Olanzapine P: Resume home medications when appropriate  Best Practice (right click and "Reselect all SmartList Selections" daily)   Diet/type: NPO DVT prophylaxis: enoxaparin (LOVENOX) injection 40 mg Start: 05/27/23 1000 Pressure ulcer(s): not present on admission  GI prophylaxis: PPI Lines: N/A Foley:  Yes, and it is no longer needed Code Status:  full code Last date of multidisciplinary goals of care discussion: Continue ongoing care   Torres Hardenbrook D. Harris, NP-C [facility] Personal contact information can be found on Amion  If no contact or response made please call [phone] 05/29/2023, 7:07 AM

## 2023-05-29 NOTE — Procedures (Signed)
Objective Swallowing Evaluation: Type of Study: FEES-Fiberoptic Endoscopic Evaluation of Swallow   Patient Details  Name: [name] MRN: [id number] Date of Birth: 1956-02-25  Today's Date: 05/29/2023 Time: SLP Start Time (ACUTE ONLY): 1330 -SLP Stop Time (ACUTE ONLY): 1405  SLP Time Calculation (min) (ACUTE ONLY): 35 min   Past Medical History:  Past Medical History:  Diagnosis Date   Anemia    Anxiety    Arthritis    Bipolar 1 disorder (HCC)    CHF (congestive heart failure) (HCC)    COPD (chronic obstructive pulmonary disease) (HCC)    Depression    Diabetes mellitus without complication (HCC)    Dyspnea    Emphysema of lung (HCC)    GERD (gastroesophageal reflux disease)    Headache    migraines   History of hiatal hernia    History of kidney stones    Hyperlipidemia    Hypertension    Neuromuscular disorder (HCC)    neuropathy   On home oxygen therapy    Pneumonia 2015, 2019   Tracheomalacia    Vaginal Pap smear, abnormal    Past Surgical History:  Past Surgical History:  Procedure Laterality Date   CATARACT EXTRACTION W/PHACO Left 01/14/2020   Procedure: CATARACT EXTRACTION PHACO AND INTRAOCULAR LENS PLACEMENT LEFT EYE;  Surgeon: Fabio Pierce, MD;  Location: [facility];  Service: Ophthalmology;  Laterality: Left;  CDE: 8.18   CATARACT EXTRACTION W/PHACO Right 02/01/2020   Procedure: CATARACT EXTRACTION PHACO AND INTRAOCULAR LENS PLACEMENT RIGHT EYE;  Surgeon: Fabio Pierce, MD;  Location: [facility];  Service: Ophthalmology;  Laterality: Right;  CDE: 9.94   CHEILECTOMY Right 01/28/2020   Procedure: KELLER BUNION IMPLANT RIGHT FOOT CHEILECTOMY RIGHT ROOT;  Surgeon: Asencion Islam, DPM;  Location: [facility];  Service: Podiatry;  Laterality: Right;  BLOCK   CHOLECYSTECTOMY     COLONOSCOPY  July 2010   Dr. Lovell Sheehan: 3 rectal polyps, not enough tissue for pathologic examination, recommended surveillance in 3 years   COLONOSCOPY N/A 10/23/2014   RMR: Multiple  colonic polyps removed as described above. No endoscopic explaniation for abdominal pain. however. next tcs 10/2019   ESOPHAGOGASTRODUODENOSCOPY  July 2010   Dr. Lovell Sheehan: gastritis and duodenitis, H.pylori negative   ESOPHAGOGASTRODUODENOSCOPY N/A 10/23/2014   RMR: Normal EGD. Status post passage of a Maloney dilator. Today's finding s would not explain abdominal pain   EYE SURGERY Left 01/14/2020   cataract removal   GANGLION CYST EXCISION Left 09/07/2018   Procedure: REMOVAL GANGLION OF WRIST;  Surgeon: Vickki Hearing, MD;  Location: [facility];  Service: Orthopedics;  Laterality: Left;   HERNIA REPAIR     Dr. Lovell Sheehan   KIDNEY STONE SURGERY     MALONEY DILATION N/A 10/23/2014   Procedure: Elease Hashimoto DILATION;  Surgeon: Corbin Ade, MD;  Location: [facility];  Service: Endoscopy;  Laterality: N/A;   OPEN REDUCTION INTERNAL FIXATION (ORIF) DISTAL RADIAL FRACTURE Right 12/09/2015   Procedure: OPEN REDUCTION INTERNAL FIXATION (ORIF) RIGHT DISTAL RADIUS;  Surgeon: Betha Loa, MD;  Location: [facility];  Service: Orthopedics;  Laterality: Right;   HPI: 67 year old woman history of COPD, polysubstance abuse, and chronic hypoxic respiratory failure on 4 L O2 followed by Dr. Elesa Massed in pulmonary clinic presents with acute on chronic hypoxemic respite failure felt related to COPD and possible exacerbation of CHF. Has been on and off Bipap.   No data recorded   Recommendations for follow up therapy are one component of a multi-disciplinary discharge planning  process, led by the attending physician.  Recommendations may be updated based on patient status, additional functional criteria and insurance authorization.  Assessment / Plan / Recommendation     05/29/2023    1:00 PM  Clinical Impressions  Clinical Impression Patient presents with a mild pharyngeal phase dysphagia characterized by loss of bolus consistently to the the level of the vallecula, at times remaining in place for 2-3  seconds prior to the initiation of the swallow, worse with solids vs liquids. Despite delays, full airway protection noted with thin liquids. Trace coating noted post swallow in vallecula and pyriform sinsuses post swallow, mixing with already existing thick mucous and penetrating the airway. This bolus tinged (puree) mucous remained well above the vocal cords however, decreasing with cued throat clear and subsequent swallows. At this time, highest risk of aspiration would be during periods of lethargy, increased WOB, and if patient has pharyngeal residuals and/or penetrates and is placed on Bipap with the potential to force material into the airway. Discussed with RN and spouse. Recommend initiation of conservative diet, sips of plain thin H20 and thorough oral care prior to placing on Bipap. SLP will f/u closely for diet tolerance and potential to advance diet.  SLP Visit Diagnosis Dysphagia, pharyngeal phase (R13.13)  Impact on safety and function Mild aspiration risk         05/29/2023    1:00 PM  Treatment Recommendations  Treatment Recommendations Therapy as outlined in treatment plan below        05/29/2023    1:00 PM  Prognosis  Prognosis for improved oropharyngeal function Good       05/29/2023    1:00 PM  Diet Recommendations  SLP Diet Recommendations Dysphagia 1 (Puree) solids;Thin liquid  Liquid Administration via Cup;Straw  Medication Administration Crushed with puree  Compensations Minimize environmental distractions;Slow rate;Small sips/bites;Clear throat intermittently  Postural Changes Seated upright at 90 degrees         05/29/2023    1:00 PM  Other Recommendations  Oral Care Recommendations Oral care BID  Follow Up Recommendations No SLP follow up  Functional Status Assessment Patient has had a recent decline in their functional status and demonstrates the ability to make significant improvements in function in a reasonable and predictable amount of time.        05/29/2023    1:00 PM  Frequency and Duration   Speech Therapy Frequency (ACUTE ONLY) min 2x/week  Treatment Duration 2 weeks         05/29/2023    1:00 PM  Oral Phase  Oral Phase [facility]       05/29/2023    1:00 PM  Pharyngeal Phase  Pharyngeal Phase Impaired  Pharyngeal- Thin Teaspoon Delayed swallow initiation-vallecula;Reduced tongue base retraction;Pharyngeal residue - valleculae  Pharyngeal Material does not enter airway  Pharyngeal- Thin Cup Delayed swallow initiation-vallecula;Reduced tongue base retraction;Pharyngeal residue - valleculae  Pharyngeal Material does not enter airway  Pharyngeal- Thin Straw Delayed swallow initiation-vallecula;Reduced tongue base retraction;Pharyngeal residue - valleculae  Pharyngeal Material does not enter airway  Pharyngeal- Puree Delayed swallow initiation-vallecula;Pharyngeal residue - valleculae;Penetration/Aspiration during swallow;Penetration/Apiration after swallow  Pharyngeal Material enters airway, remains ABOVE vocal cords and not ejected out        05/29/2023    1:00 PM  Cervical Esophageal Phase   Cervical Esophageal Phase [facility]     Zoey Gilkeson Meryl 05/29/2023, 2:17 PM

## 2023-05-29 NOTE — Plan of Care (Signed)
  Problem: Activity: Goal: Risk for activity intolerance will decrease Outcome: Progressing   

## 2023-05-29 NOTE — Progress Notes (Signed)
eLink Physician-Brief Progress Note Patient Name: [name] DOB: Jun 21, 1956 MRN: [id number]   Date of Service  05/29/2023  HPI/Events of Note  Elevated calcium 13.4 Suspect related to malignancy  eICU Interventions  Saline infusion Check PTH     Intervention Category Intermediate Interventions: Electrolyte abnormality - evaluation and management  Henry Russel, P 05/29/2023, 4:59 AM

## 2023-05-29 NOTE — Progress Notes (Signed)
SLP Cancellation Note  Patient Details Name: [name] MRN: [id number] DOB: 1955/12/11   Cancelled treatment:       Reason Eval/Treat Not Completed: Medical issues which prohibited therapy. Patient remains on Bipap. May come off this afternoon per MD however plan to hold eval until next date to ensure stable from a respiratory standpoint. Will check next date.   Ferdinand Lango MA, CCC-SLP    [name] 05/29/2023, 8:29 AM

## 2023-05-30 DIAGNOSIS — J9622 Acute and chronic respiratory failure with hypercapnia: Secondary | ICD-10-CM | POA: Diagnosis not present

## 2023-05-30 DIAGNOSIS — J9621 Acute and chronic respiratory failure with hypoxia: Secondary | ICD-10-CM | POA: Diagnosis not present

## 2023-05-30 LAB — BASIC METABOLIC PANEL
Anion gap: 6 (ref 5–15)
BUN: 17 mg/dL (ref 8–23)
CO2: 33 mmol/L — ABNORMAL HIGH (ref 22–32)
Calcium: 13.1 mg/dL (ref 8.9–10.3)
Chloride: 96 mmol/L — ABNORMAL LOW (ref 98–111)
Creatinine, Ser: 0.6 mg/dL (ref 0.44–1.00)
GFR, Estimated: >60 mL/min (ref >60)
Glucose, Bld: 138 mg/dL — ABNORMAL HIGH (ref 70–99)
Potassium: 3.5 mmol/L (ref 3.5–5.1)
Sodium: 135 mmol/L (ref 135–145)

## 2023-05-30 LAB — GLUCOSE, CAPILLARY
Glucose-Capillary: 100 mg/dL — ABNORMAL HIGH (ref 70–99)
Glucose-Capillary: 114 mg/dL — ABNORMAL HIGH (ref 70–99)
Glucose-Capillary: 126 mg/dL — ABNORMAL HIGH (ref 70–99)
Glucose-Capillary: 129 mg/dL — ABNORMAL HIGH (ref 70–99)
Glucose-Capillary: 84 mg/dL (ref 70–99)
Glucose-Capillary: 87 mg/dL (ref 70–99)
Glucose-Capillary: 90 mg/dL (ref 70–99)

## 2023-05-30 LAB — CBC
HCT: 31.2 % — ABNORMAL LOW (ref 36.0–46.0)
Hemoglobin: 9.8 g/dL — ABNORMAL LOW (ref 12.0–15.0)
MCH: 28.8 pg (ref 26.0–34.0)
MCHC: 31.4 g/dL (ref 30.0–36.0)
MCV: 91.8 fL (ref 80.0–100.0)
Platelets: 275 10*3/uL (ref 150–400)
RBC: 3.4 MIL/uL — ABNORMAL LOW (ref 3.87–5.11)
RDW: 15.1 % (ref 11.5–15.5)
WBC: 19.8 10*3/uL — ABNORMAL HIGH (ref 4.0–10.5)
nRBC: 0.1 % (ref 0.0–0.2)

## 2023-05-30 LAB — CALCIUM, IONIZED: Calcium, Ionized, Serum: 7.6 mg/dL — ABNORMAL HIGH (ref 4.5–5.6)

## 2023-05-30 LAB — PTH, INTACT AND CALCIUM
Calcium, Total (PTH): 13.2 mg/dL (ref 8.7–10.3)
PTH: 71 pg/mL — ABNORMAL HIGH (ref 15–65)

## 2023-05-30 LAB — PARATHYROID HORMONE, INTACT (NO CA): PTH: 99 pg/mL — ABNORMAL HIGH (ref 15–65)

## 2023-05-30 MED ORDER — INSULIN ASPART 100 UNIT/ML IJ SOLN
0.0000 [IU] | Freq: Three times a day (TID) | INTRAMUSCULAR | Status: DC
  Administered 2023-05-31: 3 [IU] via SUBCUTANEOUS
  Administered 2023-05-31: 5 [IU] via SUBCUTANEOUS
  Administered 2023-06-01 (×2): 3 [IU] via SUBCUTANEOUS
  Administered 2023-06-02: 8 [IU] via SUBCUTANEOUS

## 2023-05-30 MED ORDER — OLANZAPINE 5 MG PO TABS
5.0000 mg | ORAL_TABLET | Freq: Every day | ORAL | Status: DC
  Administered 2023-05-30 – 2023-06-02 (×4): 5 mg via ORAL
  Filled 2023-05-30 (×4): qty 1

## 2023-05-30 MED ORDER — DOXYCYCLINE HYCLATE 100 MG PO TABS
100.0000 mg | ORAL_TABLET | Freq: Two times a day (BID) | ORAL | Status: DC
  Administered 2023-05-30 – 2023-06-02 (×7): 100 mg via ORAL
  Filled 2023-05-30 (×7): qty 1

## 2023-05-30 MED ORDER — ACETAMINOPHEN 325 MG PO TABS
650.0000 mg | ORAL_TABLET | Freq: Four times a day (QID) | ORAL | Status: DC | PRN
  Administered 2023-06-01: 650 mg via ORAL
  Filled 2023-05-30: qty 2

## 2023-05-30 MED ORDER — PREDNISONE 20 MG PO TABS: 20.0000 mg | ORAL_TABLET | Freq: Every day | ORAL | Status: DC

## 2023-05-30 MED ORDER — PANTOPRAZOLE SODIUM 40 MG PO TBEC
40.0000 mg | DELAYED_RELEASE_TABLET | Freq: Every day | ORAL | Status: DC
  Administered 2023-05-30 – 2023-06-02 (×4): 40 mg via ORAL
  Filled 2023-05-30 (×4): qty 1

## 2023-05-30 MED ORDER — AMLODIPINE BESYLATE 10 MG PO TABS
10.0000 mg | ORAL_TABLET | Freq: Every day | ORAL | Status: DC
  Administered 2023-05-30 – 2023-06-02 (×4): 10 mg via ORAL
  Filled 2023-05-30 (×4): qty 1

## 2023-05-30 MED ORDER — PREDNISONE 20 MG PO TABS
40.0000 mg | ORAL_TABLET | Freq: Every day | ORAL | Status: DC
  Administered 2023-05-31 – 2023-06-02 (×3): 40 mg via ORAL
  Filled 2023-05-30 (×3): qty 2

## 2023-05-30 MED ORDER — METOPROLOL TARTRATE 12.5 MG HALF TABLET
12.5000 mg | ORAL_TABLET | Freq: Two times a day (BID) | ORAL | Status: DC
  Administered 2023-05-30 – 2023-06-02 (×7): 12.5 mg via ORAL
  Filled 2023-05-30 (×7): qty 1

## 2023-05-30 MED ORDER — DULOXETINE HCL 20 MG PO CPEP
40.0000 mg | ORAL_CAPSULE | Freq: Every day | ORAL | Status: DC
  Administered 2023-05-30 – 2023-06-02 (×4): 40 mg via ORAL
  Filled 2023-05-30 (×4): qty 2

## 2023-05-30 NOTE — TOC Initial Note (Signed)
Transition of Care [facility]) - Initial/Assessment Note    Patient Details  Name: [name] MRN: [id number] Date of Birth: 07-15-1955  Transition of [facility] CM/SW Contact:    Leone Haven, RN Phone Number: 05/30/2023, 6:41 PM  Clinical Narrative:                 From home with spouse, has PCP and insurance on file, states has no HH services in place at this time, has hoe oxygen 4 liters, and  a walker.  States spouse will transport her  home at [employer] and he is support system, states gets medications from [place].  Pta self ambulatory walker.  NCM offered choice for HHPT, she states she does not have a preference.   NCM made referral to [facility] with [facility] for HHPT, she is able to take referral.  Soc will begin 24 to 48 hrs post dc.   Expected Discharge Plan: Home w Home Health Services Barriers to Discharge: Continued Medical Work up   Patient Goals and CMS Choice Patient states their goals for this hospitalization and ongoing recovery are:: return home CMS Medicare.gov Compare Post Acute Care list provided to:: Patient Choice offered to / list presented to : Patient      Expected Discharge Plan and Services In-house Referral: NA Discharge Planning Services: CM Consult Post Acute Care Choice: Home Health Living arrangements for the past 2 months: Single Family Home                 DME Arranged: N/A DME Agency: NA       HH Arranged: PT          Prior Living Arrangements/Services Living arrangements for the past 2 months: Single Family Home Lives with:: Spouse Patient language and need for interpreter reviewed:: Yes Do you feel safe going back to the place where you live?: Yes      Need for Family Participation in Patient Care: Yes (Comment) Care giver support system in place?: Yes (comment) Current home services: DME (home oxygen 4 liters, walker,) Criminal Activity/Legal Involvement Pertinent to Current Situation/Hospitalization: No  - Comment as needed  Activities of Daily Living   ADL Screening (condition at time of admission) Independently performs ADLs?: No Does the patient have a NEW difficulty with bathing/dressing/toileting/self-feeding that is expected to last >3 days?: Yes (Initiates electronic notice to provider for possible OT consult) Does the patient have a NEW difficulty with getting in/out of bed, walking, or climbing stairs that is expected to last >3 days?: Yes (Initiates electronic notice to provider for possible PT consult) Does the patient have a NEW difficulty with communication that is expected to last >3 days?: Yes (Initiates electronic notice to provider for possible SLP consult) Is the patient deaf or have difficulty hearing?: No Does the patient have difficulty seeing, even when wearing glasses/contacts?: No Does the patient have difficulty concentrating, remembering, or making decisions?: Yes  Permission Sought/Granted Permission sought to share information with : Case Manager Permission granted to share information with : Yes, Verbal Permission Granted     Permission granted to share info w AGENCY: HH        Emotional Assessment Appearance:: Appears stated age Attitude/Demeanor/Rapport: Engaged Affect (typically observed): Appropriate Orientation: : Oriented to Self, Oriented to Place, Oriented to  Time, Oriented to Situation Alcohol / Substance Use: Illicit Drugs Psych Involvement: No (comment)  Admission diagnosis:  Respiratory distress [R06.03] Hypoxia [R09.02] COPD exacerbation (HCC) [J44.1] Acute on chronic respiratory failure with  hypoxia and hypercapnia (HCC) [Z61.09, J96.22] Patient Active Problem List   Diagnosis Date Noted   Acute on chronic respiratory failure with hypoxia and hypercapnia (HCC) 05/26/2023   Wounds and injuries 04/18/2023   SIRS (systemic inflammatory response syndrome) (HCC) 04/16/2023   Chest pain 07/23/2022   Insomnia 07/23/2022   Atherosclerosis of  coronary artery without angina pectoris 07/23/2022   Malignant neoplasm of upper lobe of left lung (HCC) 05/31/2022   Parotid nodule 05/31/2022   Oxygen dependent 04/06/2022   Abnormal finding on evaluation procedure 04/06/2022   Breast mass 03/30/2022   Diastolic CHF (HCC) 02/19/2022   Oral candidiasis 02/19/2022   Angina pectoris (HCC) 02/18/2022   Acute myocardial infarction (HCC) 02/11/2022   Muscle weakness 02/11/2022   Pressure injury of skin 01/03/2022   Hyponatremia 01/02/2022   Elevated troponin 01/02/2022   Leukocytosis 01/02/2022   NSTEMI (non-ST elevated myocardial infarction) (HCC) 01/02/2022   Disorder of trachea or bronchus 12/03/2021   Vitamin D deficiency 11/26/2021   Hypercalcemia 08/17/2021   Cigarette smoker 05/04/2021   Encounter for gynecological examination with Papanicolaou smear of cervix 12/31/2020   Encounter for screening fecal occult blood testing 12/31/2020   Dysuria 11/29/2020   Hallucinations 11/03/2020   Major depressive disorder, single episode, unspecified 11/03/2020   Other long term (current) drug therapy 11/03/2020   Severe recurrent major depression without psychotic features (HCC) 11/03/2020   Airway malacia 09/02/2020   Choking episode 09/02/2020   Chronic hypoxemic respiratory failure (HCC) 09/02/2020   Polyneuropathy due to type 2 diabetes mellitus (HCC) 07/11/2020   Hip pain 05/27/2020   Degeneration of lumbar intervertebral disc 07/13/2019   Lumbar degenerative disc disease 07/13/2019   Lumbar radiculopathy 07/13/2019   Lumbar spondylosis 07/13/2019   Spinal stenosis of lumbar region 06/21/2019   Long-term current use of opiate analgesic 06/21/2019   Polyneuropathy 02/07/2019   Lumbago with sciatica 02/07/2019   Ganglion cyst of dorsum of left wrist s/p removal 09/07/18    DM (diabetes mellitus), type 2 (HCC) 08/30/2017   Respiratory bronchiolitis associated interstitial lung disease (HCC) 08/30/2017   Hyperglycemia 08/26/2017    Chronic pain 08/25/2017   Mood disorder with manic features due to general medical condition 09/21/2016   Bipolar disorder in partial remission (HCC) 09/21/2016   Synovial cyst of left knee 05/20/2016   Cough 05/20/2016   Actinic keratosis 03/29/2016   Epigastric pain 03/01/2016   Closed fracture of distal end of right radius 12/16/2015   Recurrent falls 12/11/2015   Abnormal cytological findings in specimens from other female genital organs 12/11/2015   Wheezing 11/24/2015   Localized edema 11/24/2015   History of recurrent pneumonia 11/24/2015   Alcohol abuse 08/01/2015   Anorgasmia of female 01/01/2015   Low grade squamous intraepithelial lesion (LGSIL) on Papanicolaou smear of cervix 12/04/2014   Abdominal pain, chronic, epigastric 11/20/2014   Dysphagia, pharyngoesophageal phase    Hx of colonic polyps    COPD exacerbation (HCC) 07/05/2014   GERD without esophagitis 07/05/2014   Bipolar 1 disorder (HCC) 07/05/2014   Anxiety 07/05/2014   Polysubstance abuse (HCC) 07/05/2014   Obesity (BMI 30-39.9) 07/05/2014   Acute metabolic encephalopathy 07/05/2014   History of colonic polyps 04/01/2014   Essential hypertension 03/06/2014   Mild dysplasia of cervix 11/13/2013   Benzodiazepine dependence (HCC) 08/26/2012   COPD GOLD 2/ still smoker  08/25/2012   Panic disorder 08/25/2012   Tobacco use disorder 08/25/2012   Hyperlipidemia 08/25/2012   PCP:  Kara Pacer, NP Pharmacy:  [facility] - [place], [place] - [address] [place] [address] Phone: [phone] Fax: [phone]     Social Determinants of Health (SDOH) Social History: SDOH Screenings   Food Insecurity: No Food Insecurity (04/16/2023)  Housing: Patient Unable To Answer (04/16/2023)  Transportation Needs: No Transportation Needs (04/16/2023)  Utilities: Not At Risk (04/16/2023)  Alcohol Screen: Low Risk  (12/31/2020)  Depression (PHQ2-9): Medium Risk  (12/31/2020)  Financial Resource Strain: High Risk (12/31/2020)  Physical Activity: Insufficiently Active (12/31/2020)  Social Connections: Moderately Isolated (12/31/2020)  Stress: Stress Concern Present (12/31/2020)  Tobacco Use: High Risk (05/26/2023)   SDOH Interventions:     Readmission Risk Interventions    05/30/2023    6:37 PM  Readmission Risk Prevention Plan  Transportation Screening Complete  Medication Review (RN Care Manager) Complete  HRI or Home Care Consult Complete  Palliative Care Screening Not Applicable  Skilled Nursing Facility Not Applicable

## 2023-05-30 NOTE — Plan of Care (Signed)

## 2023-05-30 NOTE — Care Management Important Message (Signed)
Important Message  Patient Details  Name: [name] MRN: [id number] Date of Birth: 05-Sep-1955   Important Message Given:  Yes - Medicare IM     Dorena Bodo 05/30/2023, 3:25 PM

## 2023-05-30 NOTE — Progress Notes (Signed)
   05/30/23 1047  Spiritual Encounters  Type of Visit Initial  Care provided to: Pt and family  Referral source Family  Reason for visit Routine spiritual support  OnCall Visit No   Visited with patient and spouse, provided spiritual care and prayer as per request.

## 2023-05-30 NOTE — Progress Notes (Signed)
eLink Physician-Brief Progress Note Patient Name: [name] DOB: [birth year] MRN: [id number]   Date of Service  05/30/2023  HPI/Events of Note  Asked to renew iV fluids for high calcium level.   eICU Interventions  Renewed. Day team to decide about continuing it since notes mention this is chronic     Intervention Category Intermediate Interventions: Electrolyte abnormality - evaluation and management  Oretha Milch 05/30/2023, 4:39 AM

## 2023-05-30 NOTE — Progress Notes (Signed)
Speech Language Pathology Treatment: Dysphagia  Patient Details Name: [name] MRN: [id number] DOB: February 26, 1956 Today's Date: 05/30/2023 Time: 2951-8841 SLP Time Calculation (min) (ACUTE ONLY): 15 min  Assessment / Plan / Recommendation Clinical Impression  Pt seen for dysphagia intervention following FEES yesterday on nasal cannula. Pt was sleepy throughout most of session although began to wake as session progressed. She is on a puree diet currently and upgraded trial of Dys 3/regular texture attempted for possible upgrade. Mastication was prolonged with mild residue at right upper dentition and able to remove with sips water. Initially she was unable to recall strategy of intermittent throat clear but recalled at end of session and needed cues to implement. For now recommend she continue on puree (Dys 1), thin liquids, crush pills and intermittent throat clear. ST will continue to follow.    HPI HPI: 67 year old woman history of COPD, polysubstance abuse, and chronic hypoxic respiratory failure on 4 L O2 followed by Dr. Elesa Massed in pulmonary clinic presents with acute on chronic hypoxemic respite failure felt related to COPD and possible exacerbation of CHF. Has been on and off Bipap.      SLP Plan  Continue with current plan of care      Recommendations for follow up therapy are one component of a multi-disciplinary discharge planning process, led by the attending physician.  Recommendations may be updated based on patient status, additional functional criteria and insurance authorization.    Recommendations  Diet recommendations: Dysphagia 1 (puree);Thin liquid Liquids provided via: Straw;Cup Medication Administration: Crushed with puree Supervision: Staff to assist with self feeding Compensations: Slow rate;Small sips/bites;Clear throat intermittently Postural Changes and/or Swallow Maneuvers: Seated upright 90 degrees                  Oral care BID   Intermittent  Supervision/Assistance Dysphagia, pharyngeal phase (R13.13)     Continue with current plan of care     Royce Macadamia  05/30/2023, 3:20 PM

## 2023-05-30 NOTE — Progress Notes (Signed)
   05/30/23 2234  BiPAP/CPAP/SIPAP  BiPAP/CPAP/SIPAP Pt Type Adult  BiPAP/CPAP/SIPAP DREAMSTATIOND  Mask Type Full face mask  Mask Size Medium  IPAP 15 cmH20  EPAP 5 cmH2O  Flow Rate 4 lpm  Patient Home Equipment No  Auto Titrate No   Pt sleeping comfortably on hospital bipap.  RT wil continue to monitor

## 2023-05-30 NOTE — Plan of Care (Signed)
  Problem: Education: Goal: Knowledge of General Education information will improve Description: Including pain rating scale, medication(s)/side effects and non-pharmacologic comfort measures Outcome: Progressing   Problem: Health Behavior/Discharge Planning: Goal: Ability to manage health-related needs will improve Outcome: Progressing   Problem: Clinical Measurements: Goal: Ability to maintain clinical measurements within normal limits will improve Outcome: Progressing Goal: Will remain free from infection Outcome: Progressing Goal: Diagnostic test results will improve Outcome: Progressing Goal: Respiratory complications will improve Outcome: Progressing Goal: Cardiovascular complication will be avoided Outcome: Progressing   Problem: Activity: Goal: Risk for activity intolerance will decrease Outcome: Progressing   Problem: Nutrition: Goal: Adequate nutrition will be maintained Outcome: Progressing   Problem: Coping: Goal: Level of anxiety will decrease Outcome: Progressing   Problem: Elimination: Goal: Will not experience complications related to bowel motility Outcome: Progressing Goal: Will not experience complications related to urinary retention Outcome: Progressing   Problem: Pain Management: Goal: General experience of comfort will improve Outcome: Progressing   Problem: Safety: Goal: Ability to remain free from injury will improve Outcome: Progressing   Problem: Skin Integrity: Goal: Risk for impaired skin integrity will decrease Outcome: Progressing   Problem: Education: Goal: Knowledge of disease or condition will improve Outcome: Progressing Goal: Knowledge of the prescribed therapeutic regimen will improve Outcome: Progressing Goal: Individualized Educational Video(s) Outcome: Progressing   Problem: Activity: Goal: Ability to tolerate increased activity will improve Outcome: Progressing Goal: Will verbalize the importance of balancing  activity with adequate rest periods Outcome: Progressing   Problem: Respiratory: Goal: Ability to maintain a clear airway will improve Outcome: Progressing Goal: Levels of oxygenation will improve Outcome: Progressing Goal: Ability to maintain adequate ventilation will improve Outcome: Progressing   Problem: Education: Goal: Ability to describe self-care measures that may prevent or decrease complications (Diabetes Survival Skills Education) will improve Outcome: Progressing Goal: Individualized Educational Video(s) Outcome: Progressing   Problem: Coping: Goal: Ability to adjust to condition or change in health will improve Outcome: Progressing   Problem: Fluid Volume: Goal: Ability to maintain a balanced intake and output will improve Outcome: Progressing   Problem: Health Behavior/Discharge Planning: Goal: Ability to identify and utilize available resources and services will improve Outcome: Progressing Goal: Ability to manage health-related needs will improve Outcome: Progressing   Problem: Metabolic: Goal: Ability to maintain appropriate glucose levels will improve Outcome: Progressing   Problem: Nutritional: Goal: Maintenance of adequate nutrition will improve Outcome: Progressing Goal: Progress toward achieving an optimal weight will improve Outcome: Progressing   Problem: Skin Integrity: Goal: Risk for impaired skin integrity will decrease Outcome: Progressing   Problem: Tissue Perfusion: Goal: Adequacy of tissue perfusion will improve Outcome: Progressing

## 2023-05-30 NOTE — Progress Notes (Signed)
[name]  ZOX:[id number] DOB: [birth year] DOA: 05/26/2023 PCP: Kara Pacer, NP    Brief Narrative:  67 year old with a history of COPD on chronic O2 support, LUL NSCLC status post XRT, tobacco abuse, cocaine abuse, bipolar disorder, DM2, RLS, and diastolic CHF who [facility]21 with a few days of worsening dyspnea and hypoxia in the 70s despite increasing her home oxygen from her baseline of 4 L up to 6 L.  In the ER she was found to be hypoxic and hypercapnic.  Chest x-ray was without acute findings.  She was placed on BiPAP and transferred to [facility].  Significant Events: 11/21 admitted in respiratory failure 11/23 continues to require BiPAP 11/25 [facility] assumed care of patient  Goals of Care:   Code Status: Full Code   DVT prophylaxis: enoxaparin (LOVENOX) injection 40 mg Start: 05/27/23 1000  Interim Hx: Resting in bed.  States she still feels short of breath but that this is much improved from recent.  Has no new complaints.  Is anxious to go home.  Denies chest pain nausea or vomiting.  Assessment & Plan:  Acute exacerbation of chronic hypoxic and hypercarbic respiratory failure -acute COPD exacerbation Continues to require BiPAP at night and during periods of rest -on 4 L nasal cannula oxygen support at baseline at home (though hx suggests she is frequently not compliant with this) -being treated with usual inhaled medications and systemic steroid -making good progress -begin to ambulate  Acute metabolic encephalopathy due to hypercarbic respiratory failure Rapidly resolving with improvement in respiratory function  Restless leg syndrome Continue usual ropinirole per home dose  Polysubstance abuse to include cocaine and tobacco Educated on absolute need for cessation  Chronic hypercalcemia Records suggest this has been present for at least 2 years -suspicious for primary hyperparathyroidism -intact PTH elevated at 99 - ionized  calcium elevated at 7.6 - PTH elevated at 99 -appears to be asymptomatic at present therefore outpatient endocrinology evaluation suggested with follow-up confirmatory testing  DM2 CBG well-controlled at present  Chronic diastolic CHF Prior reported history of systolic CHF but TTE March 2024 noted normalized EF at 65-70%  Bipolar disorder/severe depression Continue usual home medications  Family Communication: Spoke with spouse at bedside Disposition: Anticipate discharge home, hopefully within 24-48 hours   Objective: Blood pressure (!) 150/96, pulse 89, temperature 98.6 F (37 C), temperature source Axillary, resp. rate 17, height 5\' 4"  (1.626 m), weight 73.8 kg, SpO2 93%.  Intake/Output Summary (Last 24 hours) at 05/30/2023 0821 Last data filed at 05/30/2023 8119 Gross per 24 hour  Intake 1597.23 ml  Output 600 ml  Net 997.23 ml   Filed Weights   05/26/23 1954 05/29/23 1730 05/30/23 0639  Weight: 72.5 kg 72.6 kg 73.8 kg    Examination: General: No acute respiratory distress Lungs: Mild diffuse wheezing but with good air movement throughout all fields with no focal crackles Cardiovascular: Regular rate and rhythm without murmur gallop or rub normal S1 and S2 Abdomen: Nontender, nondistended, soft, bowel sounds positive, no rebound, no ascites, no appreciable mass Extremities: No significant cyanosis, clubbing, or edema bilateral lower extremities  CBC: Recent Labs  Lab 05/27/23 0339 05/27/23 1120 05/30/23 0307  WBC 6.6 13.6* 19.8*  HGB 9.5* 9.7* 9.8*  HCT 30.4* 31.4* 31.2*  MCV 93.8 92.9 91.8  PLT 215 247 275   Basic Metabolic Panel: Recent Labs  Lab 05/29/23 0328 05/29/23 1421 05/30/23 0307  NA 140 137 135  K 2.9* 4.4  3.5  CL 94* 97* 96*  CO2 35* 29 33*  GLUCOSE 164* 247* 138*  BUN 15 17 17   CREATININE 0.67 0.75 0.60  CALCIUM 13.4* 13.3* 13.1*   GFR: Estimated Creatinine Clearance: 66.2 mL/min (by C-G formula based on SCr of 0.6  mg/dL).   Scheduled Meds:  budesonide  0.5 mg Inhalation BID   Chlorhexidine Gluconate Cloth  6 each Topical Daily   enoxaparin (LOVENOX) injection  40 mg Subcutaneous Q24H   feeding supplement  237 mL Oral BID BM   influenza vaccine adjuvanted  0.5 mL Intramuscular Tomorrow-1000   insulin aspart  0-15 Units Subcutaneous Q4H   ipratropium-albuterol  3 mL Nebulization Q4H   methylPREDNISolone (SOLU-MEDROL) injection  40 mg Intravenous Daily   metoprolol tartrate  5 mg Intravenous Q8H   nicotine  21 mg Transdermal Daily   mouth rinse  15 mL Mouth Rinse 4 times per day   pantoprazole (PROTONIX) IV  40 mg Intravenous Q24H   pneumococcal 20-valent conjugate vaccine  0.5 mL Intramuscular Tomorrow-1000   rOPINIRole  3 mg Oral QHS   Continuous Infusions:  doxycycline (VIBRAMYCIN) IV 100 mg (05/30/23 0024)     LOS: 4 days   Lonia Blood, MD [facility] Office  [phone] Pager - Text Page per Loretha Stapler  If 7PM-7AM, please contact night-coverage per Amion 05/30/2023, 8:21 AM

## 2023-05-31 DIAGNOSIS — J9622 Acute and chronic respiratory failure with hypercapnia: Secondary | ICD-10-CM | POA: Diagnosis not present

## 2023-05-31 DIAGNOSIS — J9621 Acute and chronic respiratory failure with hypoxia: Secondary | ICD-10-CM | POA: Diagnosis not present

## 2023-05-31 LAB — COMPREHENSIVE METABOLIC PANEL
ALT: 11 U/L (ref 0–44)
AST: 14 U/L — ABNORMAL LOW (ref 15–41)
Albumin: 2.5 g/dL — ABNORMAL LOW (ref 3.5–5.0)
Alkaline Phosphatase: 59 U/L (ref 38–126)
Anion gap: 5 (ref 5–15)
BUN: 14 mg/dL (ref 8–23)
CO2: 36 mmol/L — ABNORMAL HIGH (ref 22–32)
Calcium: 13.1 mg/dL (ref 8.9–10.3)
Chloride: 101 mmol/L (ref 98–111)
Creatinine, Ser: 0.69 mg/dL (ref 0.44–1.00)
GFR, Estimated: >60 mL/min (ref >60)
Glucose, Bld: 90 mg/dL (ref 70–99)
Potassium: 2.8 mmol/L — ABNORMAL LOW (ref 3.5–5.1)
Sodium: 142 mmol/L (ref 135–145)
Total Bilirubin: 0.4 mg/dL (ref <1.2)
Total Protein: 4.8 g/dL — ABNORMAL LOW (ref 6.5–8.1)

## 2023-05-31 LAB — GLUCOSE, CAPILLARY
Glucose-Capillary: 86 mg/dL (ref 70–99)
Glucose-Capillary: 108 mg/dL — ABNORMAL HIGH (ref 70–99)
Glucose-Capillary: 171 mg/dL — ABNORMAL HIGH (ref 70–99)
Glucose-Capillary: 238 mg/dL — ABNORMAL HIGH (ref 70–99)
Glucose-Capillary: 99 mg/dL (ref 70–99)

## 2023-05-31 LAB — MAGNESIUM
Magnesium: 0.9 mg/dL — CL (ref 1.7–2.4)
Magnesium: 1.9 mg/dL (ref 1.7–2.4)

## 2023-05-31 LAB — PHOSPHORUS: Phosphorus: 2.3 mg/dL — ABNORMAL LOW (ref 2.5–4.6)

## 2023-05-31 MED ORDER — IPRATROPIUM-ALBUTEROL 0.5-2.5 (3) MG/3ML IN SOLN
3.0000 mL | Freq: Two times a day (BID) | RESPIRATORY_TRACT | Status: DC
  Administered 2023-05-31 – 2023-06-02 (×4): 3 mL via RESPIRATORY_TRACT
  Filled 2023-05-31 (×4): qty 3

## 2023-05-31 MED ORDER — SODIUM CHLORIDE 0.9 % IV SOLN: INTRAVENOUS | Status: AC

## 2023-05-31 MED ORDER — CALCITONIN (SALMON) 200 UNIT/ML IJ SOLN
4.0000 [IU]/kg | Freq: Two times a day (BID) | INTRAMUSCULAR | Status: AC
  Administered 2023-05-31 – 2023-06-01 (×4): 292 [IU] via SUBCUTANEOUS
  Filled 2023-05-31 (×4): qty 1.46

## 2023-05-31 MED ORDER — ZOLEDRONIC ACID 4 MG/100ML IV SOLN
4.0000 mg | Freq: Once | INTRAVENOUS | Status: AC
  Administered 2023-05-31: 4 mg via INTRAVENOUS
  Filled 2023-05-31: qty 100

## 2023-05-31 MED ORDER — MAGNESIUM SULFATE 4 GM/100ML IV SOLN
4.0000 g | Freq: Once | INTRAVENOUS | Status: AC
  Administered 2023-05-31: 4 g via INTRAVENOUS
  Filled 2023-05-31: qty 100

## 2023-05-31 MED ORDER — POTASSIUM CHLORIDE CRYS ER 20 MEQ PO TBCR
40.0000 meq | EXTENDED_RELEASE_TABLET | ORAL | Status: DC
  Administered 2023-05-31: 40 meq via ORAL
  Filled 2023-05-31: qty 2

## 2023-05-31 MED ORDER — MAGNESIUM SULFATE 2 GM/50ML IV SOLN
2.0000 g | Freq: Once | INTRAVENOUS | Status: AC
  Administered 2023-05-31: 2 g via INTRAVENOUS
  Filled 2023-05-31: qty 50

## 2023-05-31 MED ORDER — POTASSIUM CHLORIDE CRYS ER 20 MEQ PO TBCR
40.0000 meq | EXTENDED_RELEASE_TABLET | ORAL | Status: AC
  Administered 2023-05-31 (×3): 40 meq via ORAL
  Filled 2023-05-31 (×3): qty 2

## 2023-05-31 NOTE — Progress Notes (Signed)
   05/31/23 2338  BiPAP/CPAP/SIPAP  $ Non-Invasive Ventilator  Non-Invasive Vent Subsequent  BiPAP/CPAP/SIPAP Pt Type Adult  BiPAP/CPAP/SIPAP DREAMSTATIOND  Mask Type Full face mask  Mask Size Small  IPAP 15 cmH20  EPAP 5 cmH2O  Flow Rate 3 lpm  Patient Home Equipment No  Auto Titrate No   Pt sleeping comfortably on bipap.  RT will continue to monitor

## 2023-05-31 NOTE — TOC Progression Note (Signed)
Transition of Care [facility]) - Progression Note    Patient Details  Name: [name] MRN: [id number] Date of Birth: 02-03-1956  [facility] CM/SW Contact  Leone Haven, RN Phone Number: 05/31/2023, 3:44 PM  Clinical Narrative:    She is set up with [facility] for HHPT, will need HHPT orders.     Expected Discharge Plan: Home w Home Health Services Barriers to Discharge: Continued Medical Work up  Expected Discharge Plan and Services In-house Referral: NA Discharge Planning Services: CM Consult Post Acute Care Choice: Home Health Living arrangements for the past 2 months: Single Family Home                 DME Arranged: N/A DME Agency: NA       HH Arranged: PT HH Agency: [facility] Date HH Agency Contacted: 05/31/23 Time HH Agency Contacted: 1543 Representative spoke with at [facility] Agency: Tresa Endo   Social Determinants of Health (SDOH) Interventions SDOH Screenings   Food Insecurity: No Food Insecurity (04/16/2023)  Housing: Patient Unable To Answer (04/16/2023)  Transportation Needs: No Transportation Needs (04/16/2023)  Utilities: Not At Risk (04/16/2023)  Alcohol Screen: Low Risk  (12/31/2020)  Depression (PHQ2-9): Medium Risk (12/31/2020)  Financial Resource Strain: High Risk (12/31/2020)  Physical Activity: Insufficiently Active (12/31/2020)  Social Connections: Moderately Isolated (12/31/2020)  Stress: Stress Concern Present (12/31/2020)  Tobacco Use: High Risk (05/26/2023)    Readmission Risk Interventions    05/30/2023    6:37 PM  Readmission Risk Prevention Plan  Transportation Screening Complete  Medication Review (RN Care Manager) Complete  HRI or Home Care Consult Complete  Palliative Care Screening Not Applicable  Skilled Nursing Facility Not Applicable

## 2023-05-31 NOTE — Progress Notes (Signed)
SLP Cancellation Note  Patient Details Name: [name] MRN: [id number] DOB: Jun 30, 1956   Cancelled treatment:       Reason Eval/Treat Not Completed: Patient declined, no reason specified Patient asleep when SLP entered room but awakened easily. She said, "I can't eat anything now" and when asked why, stated, " I feel terrible". She was unable to specify what she meant and fell back asleep quickly. SLP will continue efforts.    Angela Nevin, MA, CCC-SLP Speech Therapy

## 2023-05-31 NOTE — Progress Notes (Signed)
[name]  WNU:[id number] DOB: 09/06/1955 DOA: 05/26/2023 PCP: Kara Pacer, NP    Brief Narrative:  67 year old with a history of COPD on chronic O2 support, LUL NSCLC status post XRT, tobacco abuse, cocaine abuse, bipolar disorder, DM2, RLS, and diastolic CHF who [facility] to [facility] 11/21 with a few days of worsening dyspnea and hypoxia in the 70s despite increasing her home oxygen from her baseline of 4 L up to 6 L.  In the ER she was found to be hypoxic and hypercapnic.  Chest x-ray was without acute findings.  She was placed on BiPAP and transferred to [facility].  Significant Events: 11/21 admitted in respiratory failure 11/23 continues to require BiPAP 11/25 [facility] assumed care of patient  Goals of Care:   Code Status: Full Code   DVT prophylaxis: enoxaparin (LOVENOX) injection 40 mg Start: 05/27/23 1000  Interim Hx: No acute events recorded overnight.  Afebrile.  Vital signs stable.  Oxygen saturation 97% on 5 L salter high flow nasal cannula.  Sitting up in bedside chair reporting that she feels better overall.  No new complaints today.  Somewhat lethargic but improved compared to yesterday.  Assessment & Plan:  Acute exacerbation of chronic hypoxic and hypercarbic respiratory failure -acute COPD exacerbation Continues to require BiPAP at night and during periods of rest - on 4 L nasal cannula oxygen support at baseline at home (though hx suggests she is frequently not compliant with this) - being treated with usual inhaled medications and systemic steroid -continue to wean towards home oxygen level as able  Acute metabolic encephalopathy due to hypercarbic respiratory failure Rapidly resolving with improvement in respiratory function  Hypokalemia Supplement and follow  Severe hypomagnesemia Magnesium 0.9 -supplement and follow  Restless leg syndrome Continue usual ropinirole per home dose  Polysubstance abuse to include cocaine and  tobacco Educated on absolute need for cessation  Chronic hypercalcemia Records suggest this has been present for at least 2 years -suspicious for primary hyperparathyroidism -intact PTH elevated at 99 - ionized calcium elevated at 7.6 - PTH elevated at 99 -I am a bit concerned that her waxing and waning lethargy may be a result of this finding therefore today I have decided to initiate treatment and monitor - outpatient endocrinology evaluation suggested with follow-up confirmatory testing to consider planning for surgical correction  DM2 CBG well-controlled at present  Chronic diastolic CHF Prior reported history of systolic CHF but TTE March 2024 noted normalized EF at 65-70%  Bipolar disorder/severe depression Continue usual home medications  Family Communication: No family present at time of exam Disposition: Anticipate discharge home, hopefully within 48 hours   Objective: Blood pressure 132/79, pulse 89, temperature 98 F (36.7 C), temperature source Oral, resp. rate 19, height 5\' 4"  (1.626 m), weight 73.1 kg, SpO2 93%.  Intake/Output Summary (Last 24 hours) at 05/31/2023 0843 Last data filed at 05/31/2023 0347 Gross per 24 hour  Intake 240 ml  Output 2800 ml  Net -2560 ml   Filed Weights   05/29/23 1730 05/30/23 0639 05/31/23 0552  Weight: 72.6 kg 73.8 kg 73.1 kg    Examination: General: No acute respiratory distress Lungs: Less wheezing today but with diminished air movement diffusely -mild scattered crackles Cardiovascular: Regular rate and rhythm without murmur Abdomen: Nontender, nondistended, soft, bowel sounds positive, no rebound, no ascites, no appreciable mass Extremities: No significant cyanosis, clubbing, or edema bilateral lower extremities  CBC: Recent Labs  Lab 05/27/23 0339 05/27/23 1120 05/30/23 0307  WBC 6.6 13.6* 19.8*  HGB 9.5* 9.7* 9.8*  HCT 30.4* 31.4* 31.2*  MCV 93.8 92.9 91.8  PLT 215 247 275   Basic Metabolic Panel: Recent Labs   Lab 05/29/23 1421 05/30/23 0307 05/31/23 0239  NA 137 135 142  K 4.4 3.5 2.8*  CL 97* 96* 101  CO2 29 33* 36*  GLUCOSE 247* 138* 90  BUN 17 17 14   CREATININE 0.75 0.60 0.69  CALCIUM 13.3*  13.2* 13.1* 13.1*  MG  --   --  0.9*  PHOS  --   --  2.3*   GFR: Estimated Creatinine Clearance: 66 mL/min (by C-G formula based on SCr of 0.69 mg/dL).   Scheduled Meds:  amLODipine  10 mg Oral Daily   budesonide  0.5 mg Inhalation BID   Chlorhexidine Gluconate Cloth  6 each Topical Daily   doxycycline  100 mg Oral Q12H   DULoxetine  40 mg Oral Daily   enoxaparin (LOVENOX) injection  40 mg Subcutaneous Q24H   feeding supplement  237 mL Oral BID BM   insulin aspart  0-15 Units Subcutaneous TID WC   ipratropium-albuterol  3 mL Nebulization BID   metoprolol tartrate  12.5 mg Oral BID   nicotine  21 mg Transdermal Daily   OLANZapine  5 mg Oral Daily   mouth rinse  15 mL Mouth Rinse 4 times per day   pantoprazole  40 mg Oral Daily   potassium chloride  40 mEq Oral Q4H   predniSONE  40 mg Oral Q breakfast   Followed by   Melene Muller ON 06/05/2023] predniSONE  20 mg Oral Q breakfast   rOPINIRole  3 mg Oral QHS     LOS: 5 days   Lonia Blood, MD [facility] Office  [phone] Pager - Text Page per Loretha Stapler  If 7PM-7AM, please contact night-coverage per Amion 05/31/2023, 8:43 AM

## 2023-05-31 NOTE — Progress Notes (Signed)
Physical Therapy Treatment Patient Details Name: [name] MRN: [id number] DOB: 10/07/1955 Today's Date: 05/31/2023   History of Present Illness 67 yo female admitted 11/21 with dyspnea and repiratory failure requiring bipap. PMhx: COPD on home 3-4L, CHF, DM, smoker, polysubstance abuse, bipolar, RLS, HTN, HLD, LUL NSCLC s/p XRT.    PT Comments  Patient making good progress with mobility. Supervision for safety with supine>sit and pt stable at EOB. Min HHA provided for stability with Sit<>stand from EOB. Pt was able to sequence steps bed>chair without cues, min HHA for balance and VSS throughout with HR max in 120's. Pt educated on seated exercises for LE in recliner and completed bil. Will continue to progress pt as able during stay.    If plan is discharge home, recommend the following: A little help with bathing/dressing/bathroom;A little help with walking and/or transfers;Direct supervision/assist for medications management;Assist for transportation;Help with stairs or ramp for entrance;Supervision due to cognitive status   Can travel by private vehicle        Equipment Recommendations  None recommended by PT    Recommendations for Other Services       Precautions / Restrictions Precautions Precautions: Fall;Other (comment) Precaution Comments: watch sats and HR Restrictions Weight Bearing Restrictions: No     Mobility  Bed Mobility Overal bed mobility: Needs Assistance Bed Mobility: Supine to Sit     Supine to sit: Supervision, HOB elevated, Used rails     General bed mobility comments: cues to initiate and supervision for safety and lines    Transfers Overall transfer level: Needs assistance Equipment used: 1 person hand held assist Transfers: Sit to/from Stand, Bed to chair/wheelchair/BSC Sit to Stand: Min assist   Step pivot transfers: Min assist       General transfer comment: cues for initiation of rise/lower from EOB. HHA for steadying  balance in standing. Min Assist to guide turn/steps bed>recliner. HR in 90's-120's.    Ambulation/Gait                   Stairs             Wheelchair Mobility     Tilt Bed    Modified Rankin (Stroke Patients Only)       Balance Overall balance assessment: Needs assistance Sitting-balance support: Bilateral upper extremity supported, Feet supported Sitting balance-Leahy Scale: Fair Sitting balance - Comments: EOB with UB support   Standing balance support: Single extremity supported, Bilateral upper extremity supported, Reliant on assistive device for balance Standing balance-Leahy Scale: Poor                              Cognition Arousal: Alert Behavior During Therapy: Flat affect Overall Cognitive Status: Impaired/Different from baseline Area of Impairment: Orientation, Attention, Memory, Following commands, Safety/judgement, Problem solving                                        Exercises General Exercises - Lower Extremity Long Arc Quad: AROM, Both, 10 reps, Seated Hip Flexion/Marching: AROM, Both, 10 reps    General Comments        Pertinent Vitals/Pain Pain Assessment Pain Assessment: No/denies pain Pain Intervention(s): Monitored during session, Repositioned    Home Living  Prior Function            PT Goals (current goals can now be found in the care plan section) Acute Rehab PT Goals PT Goal Formulation: With patient/family Time For Goal Achievement: 06/11/23 Potential to Achieve Goals: Good Progress towards PT goals: Progressing toward goals    Frequency    Min 1X/week      PT Plan      Co-evaluation              AM-PAC PT "6 Clicks" Mobility   Outcome Measure  Help needed turning from your back to your side while in a flat bed without using bedrails?: None Help needed moving from lying on your back to sitting on the side of a flat bed without  using bedrails?: A Little Help needed moving to and from a bed to a chair (including a wheelchair)?: A Little Help needed standing up from a chair using your arms (e.g., wheelchair or bedside chair)?: A Little Help needed to walk in hospital room?: A Little Help needed climbing 3-5 steps with a railing? : Total 6 Click Score: 17    End of Session Equipment Utilized During Treatment: Oxygen Activity Tolerance: Patient tolerated treatment well Patient left: in chair;with call bell/phone within reach;with family/visitor present;with chair alarm set Nurse Communication: Mobility status PT Visit Diagnosis: Other abnormalities of gait and mobility (R26.89);Muscle weakness (generalized) (M62.81)     Time: 1027-2536 PT Time Calculation (min) (ACUTE ONLY): 22 min  Charges:    $Therapeutic Activity: 8-22 mins PT General Charges $$ ACUTE PT VISIT: 1 Visit                     Wynn Maudlin, DPT Acute Rehabilitation Services Office [phone]  05/31/23 12:30 PM

## 2023-05-31 NOTE — Plan of Care (Signed)
  Problem: Education: Goal: Knowledge of General Education information will improve Description: Including pain rating scale, medication(s)/side effects and non-pharmacologic comfort measures Outcome: Progressing   Problem: Health Behavior/Discharge Planning: Goal: Ability to manage health-related needs will improve Outcome: Progressing   Problem: Clinical Measurements: Goal: Ability to maintain clinical measurements within normal limits will improve Outcome: Progressing Goal: Will remain free from infection Outcome: Progressing Goal: Diagnostic test results will improve Outcome: Progressing Goal: Respiratory complications will improve Outcome: Progressing Goal: Cardiovascular complication will be avoided Outcome: Progressing   Problem: Activity: Goal: Risk for activity intolerance will decrease Outcome: Progressing   Problem: Nutrition: Goal: Adequate nutrition will be maintained Outcome: Progressing   Problem: Coping: Goal: Level of anxiety will decrease Outcome: Progressing   Problem: Elimination: Goal: Will not experience complications related to bowel motility Outcome: Progressing Goal: Will not experience complications related to urinary retention Outcome: Progressing   Problem: Pain Management: Goal: General experience of comfort will improve Outcome: Progressing   Problem: Safety: Goal: Ability to remain free from injury will improve Outcome: Progressing   Problem: Skin Integrity: Goal: Risk for impaired skin integrity will decrease Outcome: Progressing   Problem: Education: Goal: Knowledge of disease or condition will improve Outcome: Progressing Goal: Knowledge of the prescribed therapeutic regimen will improve Outcome: Progressing Goal: Individualized Educational Video(s) Outcome: Progressing   Problem: Activity: Goal: Ability to tolerate increased activity will improve Outcome: Progressing Goal: Will verbalize the importance of balancing  activity with adequate rest periods Outcome: Progressing   Problem: Respiratory: Goal: Ability to maintain a clear airway will improve Outcome: Progressing Goal: Levels of oxygenation will improve Outcome: Progressing Goal: Ability to maintain adequate ventilation will improve Outcome: Progressing   Problem: Education: Goal: Ability to describe self-care measures that may prevent or decrease complications (Diabetes Survival Skills Education) will improve Outcome: Progressing Goal: Individualized Educational Video(s) Outcome: Progressing   Problem: Coping: Goal: Ability to adjust to condition or change in health will improve Outcome: Progressing   Problem: Fluid Volume: Goal: Ability to maintain a balanced intake and output will improve Outcome: Progressing   Problem: Health Behavior/Discharge Planning: Goal: Ability to identify and utilize available resources and services will improve Outcome: Progressing Goal: Ability to manage health-related needs will improve Outcome: Progressing   Problem: Metabolic: Goal: Ability to maintain appropriate glucose levels will improve Outcome: Progressing   Problem: Nutritional: Goal: Maintenance of adequate nutrition will improve Outcome: Progressing Goal: Progress toward achieving an optimal weight will improve Outcome: Progressing   Problem: Skin Integrity: Goal: Risk for impaired skin integrity will decrease Outcome: Progressing   Problem: Tissue Perfusion: Goal: Adequacy of tissue perfusion will improve Outcome: Progressing

## 2023-06-01 DIAGNOSIS — G9341 Metabolic encephalopathy: Secondary | ICD-10-CM | POA: Diagnosis not present

## 2023-06-01 DIAGNOSIS — F17210 Nicotine dependence, cigarettes, uncomplicated: Secondary | ICD-10-CM

## 2023-06-01 DIAGNOSIS — J9621 Acute and chronic respiratory failure with hypoxia: Secondary | ICD-10-CM | POA: Diagnosis not present

## 2023-06-01 DIAGNOSIS — E669 Obesity, unspecified: Secondary | ICD-10-CM

## 2023-06-01 DIAGNOSIS — F3170 Bipolar disorder, currently in remission, most recent episode unspecified: Secondary | ICD-10-CM

## 2023-06-01 DIAGNOSIS — J441 Chronic obstructive pulmonary disease with (acute) exacerbation: Secondary | ICD-10-CM | POA: Diagnosis not present

## 2023-06-01 DIAGNOSIS — I10 Essential (primary) hypertension: Secondary | ICD-10-CM | POA: Diagnosis not present

## 2023-06-01 LAB — GLUCOSE, CAPILLARY
Glucose-Capillary: 103 mg/dL — ABNORMAL HIGH (ref 70–99)
Glucose-Capillary: 132 mg/dL — ABNORMAL HIGH (ref 70–99)
Glucose-Capillary: 133 mg/dL — ABNORMAL HIGH (ref 70–99)
Glucose-Capillary: 173 mg/dL — ABNORMAL HIGH (ref 70–99)
Glucose-Capillary: 181 mg/dL — ABNORMAL HIGH (ref 70–99)
Glucose-Capillary: 159 mg/dL — ABNORMAL HIGH (ref 70–99)

## 2023-06-01 LAB — BASIC METABOLIC PANEL
Anion gap: 5 (ref 5–15)
BUN: 12 mg/dL (ref 8–23)
CO2: 35 mmol/L — ABNORMAL HIGH (ref 22–32)
Calcium: 11.8 mg/dL — ABNORMAL HIGH (ref 8.9–10.3)
Chloride: 101 mmol/L (ref 98–111)
Creatinine, Ser: 0.6 mg/dL (ref 0.44–1.00)
GFR, Estimated: >60 mL/min (ref >60)
Glucose, Bld: 110 mg/dL — ABNORMAL HIGH (ref 70–99)
Potassium: 3.9 mmol/L (ref 3.5–5.1)
Sodium: 141 mmol/L (ref 135–145)

## 2023-06-01 LAB — CBC
HCT: 32.6 % — ABNORMAL LOW (ref 36.0–46.0)
Hemoglobin: 10 g/dL — ABNORMAL LOW (ref 12.0–15.0)
MCH: 28.4 pg (ref 26.0–34.0)
MCHC: 30.7 g/dL (ref 30.0–36.0)
MCV: 92.6 fL (ref 80.0–100.0)
Platelets: 247 10*3/uL (ref 150–400)
RBC: 3.52 MIL/uL — ABNORMAL LOW (ref 3.87–5.11)
RDW: 15.4 % (ref 11.5–15.5)
WBC: 13.5 10*3/uL — ABNORMAL HIGH (ref 4.0–10.5)
nRBC: 0 % (ref 0.0–0.2)

## 2023-06-01 LAB — PHOSPHORUS: Phosphorus: 1.8 mg/dL — ABNORMAL LOW (ref 2.5–4.6)

## 2023-06-01 LAB — MAGNESIUM: Magnesium: 1.7 mg/dL (ref 1.7–2.4)

## 2023-06-01 MED ORDER — GUAIFENESIN ER 600 MG PO TB12
1200.0000 mg | ORAL_TABLET | Freq: Two times a day (BID) | ORAL | Status: DC
  Administered 2023-06-01 – 2023-06-02 (×2): 1200 mg via ORAL
  Filled 2023-06-01 (×2): qty 2

## 2023-06-01 MED ORDER — K PHOS MONO-SOD PHOS DI & MONO 155-852-130 MG PO TABS
500.0000 mg | ORAL_TABLET | Freq: Two times a day (BID) | ORAL | Status: AC
  Administered 2023-06-01 – 2023-06-02 (×2): 500 mg via ORAL
  Filled 2023-06-01 (×2): qty 2

## 2023-06-01 MED ORDER — MAGNESIUM SULFATE 2 GM/50ML IV SOLN
2.0000 g | Freq: Once | INTRAVENOUS | Status: AC
  Administered 2023-06-01: 2 g via INTRAVENOUS
  Filled 2023-06-01: qty 50

## 2023-06-01 NOTE — Progress Notes (Signed)
PROGRESS NOTE    [name]  QIO:[id number] DOB: 1956/02/25 DOA: 05/26/2023 PCP: Kara Pacer, NP   Brief Narrative:  67 year old with a history of COPD on chronic O2 support, LUL NSCLC status post XRT, tobacco abuse, cocaine abuse, bipolar disorder, DM2, RLS, and diastolic CHF who [facility]21 with a few days of worsening dyspnea and hypoxia in the 70s despite increasing her home oxygen from her baseline of 4 L up to 6 L.  In the ER she was found to be hypoxic and hypercapnic.  Chest x-ray was without acute findings.  She was placed on BiPAP and transferred to [facility].   Significant Events: 11/21 admitted in respiratory failure 11/23 continues to require BiPAP 11/25 [facility] assumed care of patient  **[name] status is slowly improving but will need continued treatment.   Assessment and Plan:  Acute exacerbation of chronic hypoxic and hypercarbic respiratory failure -acute COPD exacerbation -Continues to require BiPAP at night and during periods of rest  -on 4 L nasal cannula oxygen support at baseline at home (though hx suggests she is frequently not compliant with this)  SpO2: 93 % O2 Flow Rate (L/min): 4 L/min FiO2 (%): 45 % -being treated with usual inhaled medications and systemic steroids which are now on po Steroid Taper -C/w albuterol 2.5 mg neb every 2 as needed for wheezing and shortness of breath as well as budesonide 0.5 mg IH twice daily and doxycycline 100 mL p.o. every 12 -Continue DuoNeb 3 mL twice daily -WBC Trend: Recent Labs  Lab 05/26/23 1401 05/27/23 0339 05/27/23 1120 05/30/23 0307 06/01/23 0226  WBC 13.0* 6.6 13.6* 19.8* 13.5*  -Will add flutter valve, incentive spirometry and guaifenesin 12 mg p.o. twice daily   Acute Metabolic Encephalopathy due to hypercarbic respiratory failure -Rapidly resolving with improvement in respiratory function  Hypophosphatemia -Phos Level Trend: Recent Labs  Lab 05/31/23 0239  06/01/23 0226  PHOS 2.3* 1.8*  -Replete with po K Phos 500 mg BID x2 -Continue to Monitor and Replete as Necessary -Repeat Phos Level in the AM   Hypomagnesemia -Magnesium Level Trend: Recent Labs  Lab 05/31/23 0239 05/31/23 1202 06/01/23 0226  MG 0.9* 1.9 1.7  -Replete with IV Mag Sulfate 2 grams -Continue to Monitor and Replete as Necessary -Repeat CMP in the AM    Restless Leg Syndrome -Continue usual ropinirole per home dose with ropinirole 3 mg p.o. nightly   Polysubstance abuse to include cocaine and tobacco -Educated on absolute need for cessation -Currently on Nicotine 21 mg Transdermal Patch q. 24   Chronic Hypercalcemia -Records suggest this has been present for at least 2 years -suspicious for primary hyperparathyroidism  -intact PTH elevated at 99  -ionized calcium elevated at 7.6  -Calcium Level Trend: Recent Labs  Lab 05/27/23 1120 05/29/23 0328 05/29/23 1421 05/30/23 0307 05/31/23 0239 06/01/23 0226  CALCIUM 12.1* 13.4* 13.3*  13.2* 13.1* 13.1* 11.8*  -There is concern that her waxing and waning lethargy may be a result of this finding therefore today Dr. Sharon Seller decided to initiate treatment and monitor and she is now on calcitonin to 92 units subcu twice daily for 4 doses -Will need outpatient endocrinology evaluation suggested with follow-up confirmatory testing to consider planning for surgical correction   DM2 -C/w Moderate Novolog SSI AC -CBG Trend: Recent Labs  Lab 05/31/23 1613 05/31/23 2110 06/01/23 0018 06/01/23 0439 06/01/23 0804 06/01/23 1103 06/01/23 1554  GLUCAP 238* 99 132* 103* 133* 173* 181*  Chronic Diastolic CHF -Prior reported history of systolic CHF but TTE March 2024 noted normalized EF at 65-70% -Strict I's and O's and Daily Weights  Intake/Output Summary (Last 24 hours) at 06/01/2023 1813 Last data filed at 06/01/2023 1615 Gross per 24 hour  Intake 1127.33 ml  Output 2050 ml  Net -922.67 ml  -C/w Metoprolol  Tartrate 12.5 mg po BID -Continue to Monitor for S/Sx of Volume Overload  Normocytic Anemia -Hgb/Hct Trend: Recent Labs  Lab 05/26/23 1401 05/27/23 0339 05/27/23 1120 05/30/23 0307 06/01/23 0226  HGB 10.2* 9.5* 9.7* 9.8* 10.0*  HCT 33.6* 30.4* 31.4* 31.2* 32.6*  MCV 96.0 93.8 92.9 91.8 92.6  -Check Anemia Panel in the AM -Continue to Monitor for S/Sx of Bleeding; No overt bleeding noted -Repeat CBC in the AM   Bipolar Disorder/Severe Depression -Continue usual home medications with Olanzapine 5 mg po Daily   GERD/GI prophylaxis -Continue with Pantoprazole 40 mg p.o. daily  Hypoalbuminemia -Patient's Albumin Trend: Recent Labs  Lab 05/26/23 1401 05/29/23 1421 05/31/23 0239  ALBUMIN 3.3* 2.9* 2.5*  -Continue to Monitor and Trend and repeat CMP in the AM   DVT prophylaxis: enoxaparin (LOVENOX) injection 40 mg Start: 05/27/23 1000    Code Status: Full Code Family Communication: No family present at bedside  Disposition Plan:  Level of care: Progressive Status is: Inpatient Remains inpatient appropriate because: Needs further clinical improvement and clearance   Consultants:  PCCM Transfer  Procedures:  As delineated as above  Antimicrobials:  Anti-infectives (From admission, onward)    Start     Dose/Rate Route Frequency Ordered Stop   05/30/23 1000  doxycycline (VIBRA-TABS) tablet 100 mg       Note to Pharmacy: To complete 7 total days of therapy including IV doses   100 mg Oral Every 12 hours 05/30/23 0829 06/02/23 2359   05/27/23 1300  doxycycline (VIBRAMYCIN) 100 mg in dextrose 5 % 250 mL IVPB  Status:  Discontinued        100 mg 125 mL/hr over 120 Minutes Intravenous Every 12 hours 05/27/23 1153 05/30/23 0829       Subjective: Seen and examined at bedside and doing little bit better.  Continues to wear supplemental oxygen.  No nausea or vomiting.  90 chest pain.  No other concerns or complaints at this time.  Objective: Vitals:   06/01/23 0806  06/01/23 1000 06/01/23 1104 06/01/23 1556  BP: 133/75  136/78 131/72  Pulse: 85 90 85 90  Resp: 20 20 19 18   Temp: 98.1 F (36.7 C)  98.8 F (37.1 C) 98.4 F (36.9 C)  TempSrc: Axillary  Oral Oral  SpO2: 96% 100% 97% 93%  Weight:      Height:        Intake/Output Summary (Last 24 hours) at 06/01/2023 1823 Last data filed at 06/01/2023 1800 Gross per 24 hour  Intake 1127.33 ml  Output 2150 ml  Net -1022.67 ml   Filed Weights   05/29/23 1730 05/30/23 0639 05/31/23 0552  Weight: 72.6 kg 73.8 kg 73.1 kg   Examination: Physical Exam:  Constitutional: WN/WD overweight Caucasian female in NAD Respiratory: Diminished to auscultation bilaterally, no wheezing, rales, rhonchi or crackles. Normal respiratory effort and patient is not tachypenic. No accessory muscle use. Unlabored breathing and wearing Supplemental O2 via [place] Cardiovascular: RRR, no murmurs / rubs / gallops. S1 and S2 auscultated. No extremity edema.  Abdomen: Soft, non-tender, distended 2/2 body habitus. Bowel sounds positive.  GU: Deferred. Musculoskeletal: No clubbing / cyanosis  of digits/nails. Good ROM, no contractures. Normal strength and muscle tone.  Skin: No rashes, lesions, ulcers on a limited skin evaluation. No induration; Warm and dry.  Neurologic: CN 2-12 grossly intact with no focal deficits.  Romberg sign and cerebellar reflexes not assessed.  Psychiatric: Normal judgment and insight. Alert and oriented x 3. Normal mood and appropriate affect.   Data Reviewed: I have personally reviewed following labs and imaging studies  CBC: Recent Labs  Lab 05/26/23 1401 05/27/23 0339 05/27/23 1120 05/30/23 0307 06/01/23 0226  WBC 13.0* 6.6 13.6* 19.8* 13.5*  HGB 10.2* 9.5* 9.7* 9.8* 10.0*  HCT 33.6* 30.4* 31.4* 31.2* 32.6*  MCV 96.0 93.8 92.9 91.8 92.6  PLT 232 215 247 275 247   Basic Metabolic Panel: Recent Labs  Lab 05/29/23 0328 05/29/23 1421 05/30/23 0307 05/31/23 0239 05/31/23 1202  06/01/23 0226  NA 140 137 135 142  --  141  K 2.9* 4.4 3.5 2.8*  --  3.9  CL 94* 97* 96* 101  --  101  CO2 35* 29 33* 36*  --  35*  GLUCOSE 164* 247* 138* 90  --  110*  BUN 15 17 17 14   --  12  CREATININE 0.67 0.75 0.60 0.69  --  0.60  CALCIUM 13.4* 13.3*  13.2* 13.1* 13.1*  --  11.8*  MG  --   --   --  0.9* 1.9 1.7  PHOS  --   --   --  2.3*  --  1.8*   GFR: Estimated Creatinine Clearance: 66 mL/min (by C-G formula based on SCr of 0.6 mg/dL). Liver Function Tests: Recent Labs  Lab 05/26/23 1401 05/29/23 1421 05/31/23 0239  AST 28 15 14*  ALT 16 13 11   ALKPHOS 72 69 59  BILITOT 1.0 0.9 0.4  PROT 7.0 5.8* 4.8*  ALBUMIN 3.3* 2.9* 2.5*   No results for input(s): "LIPASE", "AMYLASE" in the last 168 hours. No results for input(s): "AMMONIA" in the last 168 hours. Coagulation Profile: No results for input(s): "INR", "PROTIME" in the last 168 hours. Cardiac Enzymes: No results for input(s): "CKTOTAL", "CKMB", "CKMBINDEX", "TROPONINI" in the last 168 hours. BNP (last 3 results) No results for input(s): "PROBNP" in the last 8760 hours. HbA1C: No results for input(s): "HGBA1C" in the last 72 hours. CBG: Recent Labs  Lab 06/01/23 0018 06/01/23 0439 06/01/23 0804 06/01/23 1103 06/01/23 1554  GLUCAP 132* 103* 133* 173* 181*   Lipid Profile: No results for input(s): "CHOL", "HDL", "LDLCALC", "TRIG", "CHOLHDL", "LDLDIRECT" in the last 72 hours. Thyroid Function Tests: No results for input(s): "TSH", "T4TOTAL", "FREET4", "T3FREE", "THYROIDAB" in the last 72 hours. Anemia Panel: No results for input(s): "VITAMINB12", "FOLATE", "FERRITIN", "TIBC", "IRON", "RETICCTPCT" in the last 72 hours. Sepsis Labs: No results for input(s): "PROCALCITON", "LATICACIDVEN" in the last 168 hours.  Recent Results (from the past 240 hour(s))  Resp panel by RT-PCR (RSV, Flu A&B, Covid) Anterior Nasal Swab     Status: None   Collection Time: 05/26/23  1:44 PM   Specimen: Anterior Nasal Swab   Result Value Ref Range Status   SARS Coronavirus 2 by RT PCR NEGATIVE NEGATIVE Final    Comment: (NOTE) SARS-CoV-2 target nucleic acids are NOT DETECTED.  The SARS-CoV-2 RNA is generally detectable in upper respiratory specimens during the acute phase of infection. The lowest concentration of SARS-CoV-2 viral copies this assay can detect is 138 copies/mL. A negative result does not preclude SARS-Cov-2 infection and should not be used as the sole  basis for treatment or other patient management decisions. A negative result may occur with  improper specimen collection/handling, submission of specimen other than nasopharyngeal swab, presence of viral mutation(s) within the areas targeted by this assay, and inadequate number of viral copies(<138 copies/mL). A negative result must be combined with clinical observations, patient history, and epidemiological information. The expected result is Negative.  Fact Sheet for Patients:  BloggerCourse.com  Fact Sheet for Healthcare Providers:  SeriousBroker.it  This test is no t yet approved or cleared by the [place] FDA and  has been authorized for detection and/or diagnosis of SARS-CoV-2 by FDA under an Emergency Use Authorization (EUA). This EUA will remain  in effect (meaning this test can be used) for the duration of the COVID-19 declaration under Section 564(b)(1) of the Act, 21 U.S.C.section 360bbb-3(b)(1), unless the authorization is terminated  or revoked sooner.       Influenza A by PCR NEGATIVE NEGATIVE Final   Influenza B by PCR NEGATIVE NEGATIVE Final    Comment: (NOTE) The Xpert Xpress SARS-CoV-2/FLU/RSV plus assay is intended as an aid in the diagnosis of influenza from Nasopharyngeal swab specimens and should not be used as a sole basis for treatment. Nasal washings and aspirates are unacceptable for Xpert Xpress SARS-CoV-2/FLU/RSV testing.  Fact Sheet for  Patients: BloggerCourse.com  Fact Sheet for Healthcare Providers: SeriousBroker.it  This test is not yet approved or cleared by the [place] FDA and has been authorized for detection and/or diagnosis of SARS-CoV-2 by FDA under an Emergency Use Authorization (EUA). This EUA will remain in effect (meaning this test can be used) for the duration of the COVID-19 declaration under Section 564(b)(1) of the Act, 21 U.S.C. section 360bbb-3(b)(1), unless the authorization is terminated or revoked.     Resp Syncytial Virus by PCR NEGATIVE NEGATIVE Final    Comment: (NOTE) Fact Sheet for Patients: BloggerCourse.com  Fact Sheet for Healthcare Providers: SeriousBroker.it  This test is not yet approved or cleared by the [place] FDA and has been authorized for detection and/or diagnosis of SARS-CoV-2 by FDA under an Emergency Use Authorization (EUA). This EUA will remain in effect (meaning this test can be used) for the duration of the COVID-19 declaration under Section 564(b)(1) of the Act, 21 U.S.C. section 360bbb-3(b)(1), unless the authorization is terminated or revoked.  Performed at [facility], [address], [place], [place] [address]   MRSA Next Gen by PCR, Nasal     Status: None   Collection Time: 05/27/23 11:05 AM   Specimen: Nasal Mucosa; Nasal Swab  Result Value Ref Range Status   MRSA by PCR Next Gen NOT DETECTED NOT DETECTED Final    Comment: (NOTE) The GeneXpert MRSA Assay (FDA approved for NASAL specimens only), is one component of a comprehensive MRSA colonization surveillance program. It is not intended to diagnose MRSA infection nor to guide or monitor treatment for MRSA infections. Test performance is not FDA approved in patients less than 63 years old. Performed at [facility], [address] [place], [place] [address]     Radiology Studies: No  results found.  Scheduled Meds:  amLODipine  10 mg Oral Daily   budesonide  0.5 mg Inhalation BID   calcitonin  4 Units/kg Subcutaneous BID   Chlorhexidine Gluconate Cloth  6 each Topical Daily   doxycycline  100 mg Oral Q12H   DULoxetine  40 mg Oral Daily   enoxaparin (LOVENOX) injection  40 mg Subcutaneous Q24H   feeding supplement  237 mL Oral BID BM   guaiFENesin  1,200 mg Oral BID   insulin aspart  0-15 Units Subcutaneous TID WC   ipratropium-albuterol  3 mL Nebulization BID   metoprolol tartrate  12.5 mg Oral BID   nicotine  21 mg Transdermal Daily   OLANZapine  5 mg Oral Daily   mouth rinse  15 mL Mouth Rinse 4 times per day   pantoprazole  40 mg Oral Daily   phosphorus  500 mg Oral BID   predniSONE  40 mg Oral Q breakfast   Followed by   Melene Muller ON 06/05/2023] predniSONE  20 mg Oral Q breakfast   rOPINIRole  3 mg Oral QHS   Continuous Infusions:   LOS: 6 days   Marguerita Merles, DO [facility] Available via Epic secure chat 7am-7pm After these hours, please refer to coverage provider listed on [email or web address] 06/01/2023, 6:23 PM

## 2023-06-01 NOTE — Hospital Course (Addendum)
67 year old with a history of COPD on chronic O2 support, LUL NSCLC status post XRT, tobacco abuse, cocaine abuse, bipolar disorder, DM2, RLS, and diastolic CHF who [facility] to [facility] 11/21 with a few days of worsening dyspnea and hypoxia in the 70s despite increasing her home oxygen from her baseline of 4 L up to 6 L.  In the ER she was found to be hypoxic and hypercapnic.  Chest x-ray was without acute findings.  She was placed on BiPAP and transferred to [facility].   Significant Events: 11/21 admitted in respiratory failure 11/23 continues to require BiPAP 11/25 [facility] assumed care of patient  **Respiratory status is slowly improving but will need continued treatment.  Ostium improved as well.  Patient felt at her baseline and was on her baseline oxygen.  She felt well and is medically stable for discharge at this time and will need to follow-up with PCP, endocrinology, cardiology and pulmonology in outpatient setting   Assessment and Plan:  Acute exacerbation of chronic hypoxic and hypercarbic respiratory failure -acute COPD exacerbation, improved and back to Baseline -Continues to require BiPAP at night and during periods of rest  -on 4 L nasal cannula oxygen support at baseline at home (though hx suggests she is frequently not compliant with this)  SpO2: 96 % O2 Flow Rate (L/min): 4 L/min FiO2 (%): 45 % -being treated with usual inhaled medications and systemic steroids which are now on po Steroid Taper -C/w albuterol 2.5 mg neb every 2 as needed for wheezing and shortness of breath as well as budesonide 0.5 mg IH twice daily and doxycycline 100 mL p.o. every 12 -Continue DuoNeb 3 mL twice daily -WBC Trend: Recent Labs  Lab 05/26/23 1401 05/27/23 0339 05/27/23 1120 05/30/23 0307 06/01/23 0226 06/02/23 0224  WBC 13.0* 6.6 13.6* 19.8* 13.5* 14.5*  -Will add flutter valve, incentive spirometry and guaifenesin 1200 mg p.o. twice daily -Repeat CXR in 3-6 weeks and follow up with  Pulonary in the outpatient setting    Acute Metabolic Encephalopathy due to hypercarbic respiratory failure -Rapidly resolving with improvement in respiratory function  Hypokalemia -Patient's K+ Level Trend: Recent Labs  Lab 05/27/23 1120 05/29/23 0328 05/29/23 1421 05/30/23 0307 05/31/23 0239 06/01/23 0226 06/02/23 0224  K 3.5 2.9* 4.4 3.5 2.8* 3.9 3.2*  -Replete with po Kcl 40 mEQ BID x2 -Continue to Monitor and Replete as Necessary -Repeat CMP within 1 week  Hypophosphatemia -Phos Level Trend: Recent Labs  Lab 05/31/23 0239 06/01/23 0226 06/02/23 0224  PHOS 2.3* 1.8* 2.7  -Replete with po K Phos 500 mg BID x2 -Continue to Monitor and Replete as Necessary -Repeat Phos Level in the AM   Hypomagnesemia -Magnesium Level Trend: Recent Labs  Lab 05/31/23 0239 05/31/23 1202 06/01/23 0226 06/02/23 0224  MG 0.9* 1.9 1.7 1.6*  -Replete with IV Mag Sulfate 2 grams prior to D/C -Continue to Monitor and Replete as Necessary -Repeat CMP within 1 week   Restless Leg Syndrome -Continue usual ropinirole per home dose with ropinirole 3 mg p.o. nightly   Polysubstance abuse to include cocaine and tobacco -Educated on absolute need for cessation -Currently on Nicotine 21 mg Transdermal Patch q. 24   Chronic Hypercalcemia -Records suggest this has been present for at least 2 years -suspicious for primary hyperparathyroidism  -intact PTH elevated at 99  -ionized calcium elevated at 7.6  -Calcium Level Trend: Recent Labs  Lab 05/29/23 0328 05/29/23 1421 05/30/23 0307 05/31/23 0239 06/01/23 0226 06/02/23 0224  CALCIUM 13.4*  13.3*  13.2* 13.1* 13.1* 11.8* 11.1*  -There is concern that her waxing and waning lethargy may be a result of this finding therefore today Dr. Sharon Seller decided to initiate treatment and monitor and she is now on calcitonin 292 units subcu twice daily for 4 doses and this is complete -Will need outpatient Endocrinology evaluation suggested with  follow-up confirmatory testing to consider planning for surgical correction   DM2 -C/w Moderate Novolog SSI AC -CBG Trend: Recent Labs  Lab 06/01/23 1554 06/01/23 1937 06/01/23 2340 06/02/23 0425 06/02/23 0800 06/02/23 1133 06/02/23 1516  GLUCAP 181* 159* 99 113* 148* 278* 128*    Chronic Diastolic CHF -Prior reported history of systolic CHF but TTE March 2024 noted normalized EF at 65-70% -Strict I's and O's and Daily Weights No intake or output data in the 24 hours ending 06/03/23 1822 -C/w Metoprolol Tartrate 12.5 mg po BID -Continue to Monitor for S/Sx of Volume Overload and follow up with Cardiology in the outpatient setting   Leukocytosis -In the setting of Steroid Demargination -WBC Trend: Recent Labs  Lab 05/26/23 1401 05/27/23 0339 05/27/23 1120 05/30/23 0307 06/01/23 0226 06/02/23 0224  WBC 13.0* 6.6 13.6* 19.8* 13.5* 14.5*  -Repeat CBC within 1 week  Normocytic Anemia -Hgb/Hct Trend: Recent Labs  Lab 05/26/23 1401 05/27/23 0339 05/27/23 1120 05/30/23 0307 06/01/23 0226 06/02/23 0224  HGB 10.2* 9.5* 9.7* 9.8* 10.0* 9.5*  HCT 33.6* 30.4* 31.4* 31.2* 32.6* 30.7*  MCV 96.0 93.8 92.9 91.8 92.6 94.5  -Checked Anemia Panel and showed an iron level of 45, UIBC of 248, TIBC of 298, -Continue to Monitor for S/Sx of Bleeding; No overt bleeding noted -Repeat CBC within 1 week   Bipolar Disorder/Severe Depression -Continue usual home medications with Olanzapine 5 mg po Daily   GERD/GI prophylaxis -Continue with Pantoprazole 40 mg p.o. daily  Hypoalbuminemia -Patient's Albumin Trend: Recent Labs  Lab 05/26/23 1401 05/29/23 1421 05/31/23 0239 06/02/23 0224  ALBUMIN 3.3* 2.9* 2.5* 2.7*  -Continue to Monitor and Trend and repeat CMP in the AM

## 2023-06-01 NOTE — Progress Notes (Signed)
Pt was unable to tolerate BiPAP. She took BiPAP off and SPO2 on room air was dropped to 76%. Pt agreed with continue on 4LPM of HFNCL. Her SPO2 99-100%. NSR on the monitor, stable hemodynamically, afebrile. Strong with non productive coughing. Positive mild SOB with exertions while ambulating to [facility]. We will continue to monitor.  Filiberto Pinks, RN

## 2023-06-01 NOTE — Progress Notes (Signed)
Physical Therapy Treatment Patient Details Name: [name] MRN: [id number] DOB: 03/14/1956 Today's Date: 06/01/2023   History of Present Illness 67 yo female admitted 11/21 with dyspnea and repiratory failure requiring bipap. PMhx: COPD on home 3-4L, CHF, DM, smoker, polysubstance abuse, bipolar, RLS, HTN, HLD, LUL NSCLC s/p XRT.    PT Comments  Pt pleasantly confused with ongoing nonsensical commentary, interspersed in her conversation. Pt agreeable to work with therapy. As PT starting RN in room, pt had not eaten lunch so RN requested that pt drink an Ensure. PT sat beside pt on EoB and had pt work on leg exercises while she finished her Ensure. Afterwards assisted pt to walk around foot of bed to sit in recliner. Min A with HHA and pt support on foot board and be rail to sit in recliner. Immediately upon sitting in recliner pt reports need to urinate. Able to pivot from recliner to [facility] and back, Needs prompting and min A for balance to complete pericare. D/c plans remain appropriate. PT will continue to follow acutely.     If plan is discharge home, recommend the following: A little help with bathing/dressing/bathroom;A little help with walking and/or transfers;Direct supervision/assist for medications management;Assist for transportation;Help with stairs or ramp for entrance;Supervision due to cognitive status   Can travel by private vehicle      Yes  Equipment Recommendations  None recommended by PT       Precautions / Restrictions Precautions Precautions: Fall;Other (comment) Precaution Comments: watch sats and HR Restrictions Weight Bearing Restrictions: No     Mobility  Bed Mobility Overal bed mobility: Needs Assistance Bed Mobility: Supine to Sit     Supine to sit: HOB elevated, Used rails, Min assist     General bed mobility comments: utilized bed rail and PT hand to pull hips to EoB    Transfers Overall transfer level: Needs assistance Equipment used:  1 person hand held assist Transfers: Sit to/from Stand, Bed to chair/wheelchair/BSC Sit to Stand: Min assist   Step pivot transfers: Min assist       General transfer comment: min A for sit to stand at EoB, and for pivot transfer from recliner to [facility] and back, as pt reports needing to pee as soon as she sat down    Ambulation/Gait Ambulation/Gait assistance: Min assist Gait Distance (Feet): 8 Feet Assistive device: 1 person hand held assist (and use of foot board and bed rails) Gait Pattern/deviations: Step-to pattern, Decreased step length - right, Decreased step length - left, Shuffle Gait velocity: slowed Gait velocity interpretation: <1.31 ft/sec, indicative of household ambulator   General Gait Details: min A for steadying to walk around foot of bed to recliner on the other side       Balance Overall balance assessment: Needs assistance Sitting-balance support: Bilateral upper extremity supported, Feet supported Sitting balance-Leahy Scale: Fair Sitting balance - Comments: EOB with UB support   Standing balance support: Single extremity supported, Bilateral upper extremity supported, Reliant on assistive device for balance Standing balance-Leahy Scale: Poor Standing balance comment: single UE support                            Cognition Arousal: Alert Behavior During Therapy: Flat affect Overall Cognitive Status: Impaired/Different from baseline Area of Impairment: Orientation, Attention, Memory, Following commands, Safety/judgement, Problem solving                 Orientation Level: Disoriented to, Time, Place  Current Attention Level: Selective Memory: Decreased short-term memory Following Commands: Follows one step commands consistently Safety/Judgement: Decreased awareness of safety   Problem Solving: Slow processing, Difficulty sequencing General Comments: lots of nonsensical conversation today        Exercises General Exercises - Lower  Extremity Hip Flexion/Marching: AROM, Both, 10 reps    General Comments General comments (skin integrity, edema, etc.): VSS on 4L O2 via [place], sat EoB practiced marching while drinking Ensure to make sure she finished it      Pertinent Vitals/Pain  Denies pain      PT Goals (current goals can now be found in the care plan section) Acute Rehab PT Goals PT Goal Formulation: With patient/family Time For Goal Achievement: 06/11/23 Potential to Achieve Goals: Good Progress towards PT goals: Progressing toward goals    Frequency    Min 1X/week       AM-PAC PT "6 Clicks" Mobility   Outcome Measure  Help needed turning from your back to your side while in a flat bed without using bedrails?: None Help needed moving from lying on your back to sitting on the side of a flat bed without using bedrails?: A Little Help needed moving to and from a bed to a chair (including a wheelchair)?: A Little Help needed standing up from a chair using your arms (e.g., wheelchair or bedside chair)?: A Little Help needed to walk in hospital room?: A Little Help needed climbing 3-5 steps with a railing? : Total 6 Click Score: 17    End of Session Equipment Utilized During Treatment: Oxygen Activity Tolerance: Patient tolerated treatment well Patient left: in chair;with call bell/phone within reach;with family/visitor present;with chair alarm set Nurse Communication: Mobility status PT Visit Diagnosis: Other abnormalities of gait and mobility (R26.89);Muscle weakness (generalized) (M62.81)     Time: 5638-7564 PT Time Calculation (min) (ACUTE ONLY): 26 min  Charges:    $Therapeutic Activity: 8-22 mins $Self Care/Home Management: 8-22 PT General Charges $$ ACUTE PT VISIT: 1 Visit                     [name] B. Beverely Risen PT, DPT Acute Rehabilitation Services Please use secure chat or  Call Office [phone]    Elon Alas [facility] 06/01/2023, 5:03 PM

## 2023-06-01 NOTE — Progress Notes (Signed)
   06/01/23 0506  Urine Characteristics  Urinary Incontinence No  Urinary Interventions Post removed Foley cath on day shift at 04:45 pm. Pt was able to void 450 ml at 09:06 pm, but then unable to void later at am.  Bladder scanned at 05:06 am found urine retention.  Bladder Scan Volume (mL) 633 mL   Filiberto Pinks, RN

## 2023-06-01 NOTE — Plan of Care (Signed)
Plan of care reviewed. Pt is progressing. She is stable hemodynamically, afebrile, NSR on the monitor. No acute distress noted overnight. Pt refused BiPAP. ON 4 LPMof O2 HFNCL, SPO2 98-100%. We continue to monitor.  Problem: Clinical Measurements: Goal: Ability to maintain clinical measurements within normal limits will improve Outcome: Progressing Goal: Will remain free from infection Outcome: Progressing Goal: Diagnostic test results will improve Outcome: Progressing Goal: Respiratory complications will improve Outcome: Progressing Goal: Cardiovascular complication will be avoided Outcome: Progressing   Problem: Health Behavior/Discharge Planning: Goal: Ability to manage health-related needs will improve Outcome: Progressing   Problem: Activity: Goal: Risk for activity intolerance will decrease Outcome: Progressing   Problem: Elimination: Goal: Will not experience complications related to bowel motility Outcome: Progressing Goal: Will not experience complications related to urinary retention Outcome: Progressing   Problem: Safety: Goal: Ability to remain free from injury will improve Outcome: Progressing   Filiberto Pinks, RN

## 2023-06-01 NOTE — Progress Notes (Signed)
    06/01/23 0551  Urine Characteristics  Urine Color Yellow/straw  Urine Appearance Clear  Urinary Interventions Intermittent/Straight cath  Intermittent/Straight Cath (mL) 750 mL  Intermittent Catheter Size 14 Fr.  Hygiene Peri care, intermittent urinary cath insertion with sterile technique     Filiberto Pinks, RN

## 2023-06-01 NOTE — Progress Notes (Signed)
Speech Language Pathology Treatment: Dysphagia  Patient Details Name: ANALEAH MEINHARDT MRN: [id number] DOB: [birth year] Today's Date: 06/01/2023 Time: 1510-1530 SLP Time Calculation (min) (ACUTE ONLY): 20 min  Assessment / Plan / Recommendation Clinical Impression  Patient seen by SLP for skilled treatment focused on dysphagia goals. Patient was awake, alert and tells SLP that MD was happy with how she is doing. She had a lunch meal tray on table next to her but this was untouched. Patient told SLP that she did not mind the puree foods but she would like some "real food".  SLP helped reposition patient to be more upright and then retrieved some advanced solids to trial (canned diced peaches and graham crackers). Patient was able to self feed without difficulty. Mastication with graham crackers was mildly prolonged but complete with no residuals observed s/p initial swallows. Patient did not have any overt s/s aspiration during PO intake. She did have an instance of delayed cough after PO intake was complete but based on her reported swallow function during FEES (Fiberoptic Endoscopic Evaluation of Swallowing) on 05/29/2023 and her presentation overall from today, do not highly suspect that this cough was from aspiration. SLP is recommending to advance patient's solids to dysphagia 3 (mechanical soft) and will continue to follow to ensure toleration.   HPI HPI: 67 year old woman history of COPD, polysubstance abuse, and chronic hypoxic respiratory failure on 4 L O2 followed by Dr. Elesa Massed in pulmonary clinic presents with acute on chronic hypoxemic respite failure felt related to COPD and possible exacerbation of CHF. Has been on and off Bipap.      SLP Plan  Continue with current plan of care      Recommendations for follow up therapy are one component of a multi-disciplinary discharge planning process, led by the attending physician.  Recommendations may be updated based on patient status,  additional functional criteria and insurance authorization.    Recommendations  Diet recommendations: Dysphagia 3 (mechanical soft);Thin liquid Liquids provided via: Cup;Straw Medication Administration: Whole meds with puree Supervision: Patient able to self feed;Intermittent supervision to cue for compensatory strategies Compensations: Slow rate;Small sips/bites;Clear throat intermittently Postural Changes and/or Swallow Maneuvers: Seated upright 90 degrees                  Oral care BID   Intermittent Supervision/Assistance Dysphagia, pharyngeal phase (R13.13)     Continue with current plan of care    Angela Nevin, MA, CCC-SLP Speech Therapy

## 2023-06-01 NOTE — Plan of Care (Signed)
Problem: Education: Goal: Knowledge of General Education information will improve Description: Including pain rating scale, medication(s)/side effects and non-pharmacologic comfort measures Outcome: Progressing   Problem: Health Behavior/Discharge Planning: Goal: Ability to manage health-related needs will improve Outcome: Progressing   Problem: Clinical Measurements: Goal: Ability to maintain clinical measurements within normal limits will improve Outcome: Progressing Goal: Will remain free from infection Outcome: Progressing Goal: Diagnostic test results will improve Outcome: Progressing Goal: Respiratory complications will improve Outcome: Progressing Goal: Cardiovascular complication will be avoided Outcome: Progressing   Problem: Activity: Goal: Risk for activity intolerance will decrease Outcome: Progressing   Problem: Nutrition: Goal: Adequate nutrition will be maintained Outcome: Progressing   Problem: Coping: Goal: Level of anxiety will decrease Outcome: Progressing   Problem: Elimination: Goal: Will not experience complications related to bowel motility Outcome: Progressing Goal: Will not experience complications related to urinary retention Outcome: Progressing   Problem: Pain Management: Goal: General experience of comfort will improve Outcome: Progressing   Problem: Safety: Goal: Ability to remain free from injury will improve Outcome: Progressing   Problem: Skin Integrity: Goal: Risk for impaired skin integrity will decrease Outcome: Progressing   Problem: Education: Goal: Knowledge of disease or condition will improve Outcome: Progressing Goal: Knowledge of the prescribed therapeutic regimen will improve Outcome: Progressing Goal: Individualized Educational Video(s) Outcome: Progressing   Problem: Activity: Goal: Ability to tolerate increased activity will improve Outcome: Progressing Goal: Will verbalize the importance of balancing  activity with adequate rest periods Outcome: Progressing   Problem: Respiratory: Goal: Ability to maintain a clear airway will improve Outcome: Progressing Goal: Levels of oxygenation will improve Outcome: Progressing Goal: Ability to maintain adequate ventilation will improve Outcome: Progressing   Problem: Education: Goal: Ability to describe self-care measures that may prevent or decrease complications (Diabetes Survival Skills Education) will improve Outcome: Progressing Goal: Individualized Educational Video(s) Outcome: Progressing   Problem: Coping: Goal: Ability to adjust to condition or change in health will improve Outcome: Progressing   Problem: Fluid Volume: Goal: Ability to maintain a balanced intake and output will improve Outcome: Progressing   Problem: Health Behavior/Discharge Planning: Goal: Ability to identify and utilize available resources and services will improve Outcome: Progressing Goal: Ability to manage health-related needs will improve Outcome: Progressing   Problem: Metabolic: Goal: Ability to maintain appropriate glucose levels will improve Outcome: Progressing   Problem: Nutritional: Goal: Maintenance of adequate nutrition will improve Outcome: Progressing Goal: Progress toward achieving an optimal weight will improve Outcome: Progressing   Problem: Skin Integrity: Goal: Risk for impaired skin integrity will decrease Outcome: Progressing   Problem: Tissue Perfusion: Goal: Adequacy of tissue perfusion will improve Outcome: Progressing

## 2023-06-02 ENCOUNTER — Inpatient Hospital Stay (HOSPITAL_COMMUNITY): Payer: 59

## 2023-06-02 DIAGNOSIS — G9341 Metabolic encephalopathy: Secondary | ICD-10-CM | POA: Diagnosis not present

## 2023-06-02 DIAGNOSIS — J9621 Acute and chronic respiratory failure with hypoxia: Secondary | ICD-10-CM | POA: Diagnosis not present

## 2023-06-02 DIAGNOSIS — Z23 Encounter for immunization: Secondary | ICD-10-CM | POA: Diagnosis not present

## 2023-06-02 DIAGNOSIS — I10 Essential (primary) hypertension: Secondary | ICD-10-CM | POA: Diagnosis not present

## 2023-06-02 DIAGNOSIS — J441 Chronic obstructive pulmonary disease with (acute) exacerbation: Secondary | ICD-10-CM | POA: Diagnosis not present

## 2023-06-02 DIAGNOSIS — G8929 Other chronic pain: Secondary | ICD-10-CM

## 2023-06-02 LAB — COMPREHENSIVE METABOLIC PANEL
ALT: 12 U/L (ref 0–44)
AST: 13 U/L — ABNORMAL LOW (ref 15–41)
Albumin: 2.7 g/dL — ABNORMAL LOW (ref 3.5–5.0)
Alkaline Phosphatase: 55 U/L (ref 38–126)
Anion gap: 5 (ref 5–15)
BUN: 12 mg/dL (ref 8–23)
CO2: 38 mmol/L — ABNORMAL HIGH (ref 22–32)
Calcium: 11.1 mg/dL — ABNORMAL HIGH (ref 8.9–10.3)
Chloride: 96 mmol/L — ABNORMAL LOW (ref 98–111)
Creatinine, Ser: 0.53 mg/dL (ref 0.44–1.00)
GFR, Estimated: >60 mL/min (ref >60)
Glucose, Bld: 100 mg/dL — ABNORMAL HIGH (ref 70–99)
Potassium: 3.2 mmol/L — ABNORMAL LOW (ref 3.5–5.1)
Sodium: 139 mmol/L (ref 135–145)
Total Bilirubin: 0.4 mg/dL (ref <1.2)
Total Protein: 4.9 g/dL — ABNORMAL LOW (ref 6.5–8.1)

## 2023-06-02 LAB — CBC WITH DIFFERENTIAL/PLATELET
Abs Immature Granulocytes: 0.15 10*3/uL — ABNORMAL HIGH (ref 0.00–0.07)
Basophils Absolute: 0 10*3/uL (ref 0.0–0.1)
Basophils Relative: 0 %
Eosinophils Absolute: 0.2 10*3/uL (ref 0.0–0.5)
Eosinophils Relative: 1 %
HCT: 30.7 % — ABNORMAL LOW (ref 36.0–46.0)
Hemoglobin: 9.5 g/dL — ABNORMAL LOW (ref 12.0–15.0)
Immature Granulocytes: 1 %
Lymphocytes Relative: 13 %
Lymphs Abs: 2 10*3/uL (ref 0.7–4.0)
MCH: 29.2 pg (ref 26.0–34.0)
MCHC: 30.9 g/dL (ref 30.0–36.0)
MCV: 94.5 fL (ref 80.0–100.0)
Monocytes Absolute: 1.7 10*3/uL — ABNORMAL HIGH (ref 0.1–1.0)
Monocytes Relative: 11 %
Neutro Abs: 10.6 10*3/uL — ABNORMAL HIGH (ref 1.7–7.7)
Neutrophils Relative %: 74 %
Platelets: 261 10*3/uL (ref 150–400)
RBC: 3.25 MIL/uL — ABNORMAL LOW (ref 3.87–5.11)
RDW: 15.4 % (ref 11.5–15.5)
WBC: 14.5 10*3/uL — ABNORMAL HIGH (ref 4.0–10.5)
nRBC: 0 % (ref 0.0–0.2)

## 2023-06-02 LAB — GLUCOSE, CAPILLARY
Glucose-Capillary: 113 mg/dL — ABNORMAL HIGH (ref 70–99)
Glucose-Capillary: 99 mg/dL (ref 70–99)
Glucose-Capillary: 148 mg/dL — ABNORMAL HIGH (ref 70–99)
Glucose-Capillary: 278 mg/dL — ABNORMAL HIGH (ref 70–99)
Glucose-Capillary: 128 mg/dL — ABNORMAL HIGH (ref 70–99)

## 2023-06-02 LAB — IRON AND TIBC
Iron: 45 ug/dL (ref 28–170)
Saturation Ratios: 15 % (ref 10.4–31.8)
TIBC: 293 ug/dL (ref 250–450)
UIBC: 248 ug/dL

## 2023-06-02 LAB — VITAMIN B12: Vitamin B-12: 138 pg/mL — ABNORMAL LOW (ref 180–914)

## 2023-06-02 LAB — RETICULOCYTES
Immature Retic Fract: 23.1 % — ABNORMAL HIGH (ref 2.3–15.9)
RBC.: 3.23 MIL/uL — ABNORMAL LOW (ref 3.87–5.11)
Retic Count, Absolute: 78.8 10*3/uL (ref 19.0–186.0)
Retic Ct Pct: 2.4 % (ref 0.4–3.1)

## 2023-06-02 LAB — PHOSPHORUS: Phosphorus: 2.7 mg/dL (ref 2.5–4.6)

## 2023-06-02 LAB — FOLATE: Folate: 8.8 ng/mL (ref >5.9)

## 2023-06-02 LAB — MAGNESIUM: Magnesium: 1.6 mg/dL — ABNORMAL LOW (ref 1.7–2.4)

## 2023-06-02 LAB — FERRITIN: Ferritin: 29 ng/mL (ref 11–307)

## 2023-06-02 MED ORDER — MAGNESIUM SULFATE 2 GM/50ML IV SOLN
2.0000 g | Freq: Once | INTRAVENOUS | Status: AC
  Administered 2023-06-02: 2 g via INTRAVENOUS
  Filled 2023-06-02: qty 50

## 2023-06-02 MED ORDER — DOXYCYCLINE HYCLATE 100 MG PO TABS: 100.0000 mg | ORAL_TABLET | Freq: Two times a day (BID) | ORAL | 0 refills | Status: AC

## 2023-06-02 MED ORDER — PREDNISONE 20 MG PO TABS: ORAL_TABLET | ORAL | 0 refills | Status: AC

## 2023-06-02 MED ORDER — GUAIFENESIN ER 600 MG PO TB12: 600.0000 mg | ORAL_TABLET | Freq: Two times a day (BID) | ORAL | 0 refills | Status: AC

## 2023-06-02 MED ORDER — METOPROLOL TARTRATE 25 MG PO TABS: 12.5000 mg | ORAL_TABLET | Freq: Two times a day (BID) | ORAL | 0 refills | Status: DC

## 2023-06-02 MED ORDER — NICOTINE 21 MG/24HR TD PT24: 21.0000 mg | MEDICATED_PATCH | Freq: Every day | TRANSDERMAL | 0 refills | Status: AC

## 2023-06-02 MED ORDER — ENSURE ENLIVE PO LIQD: 237.0000 mL | Freq: Two times a day (BID) | ORAL | 12 refills | Status: DC

## 2023-06-02 MED ORDER — SALINE SPRAY 0.65 % NA SOLN: 1.0000 | NASAL | 0 refills | Status: AC | PRN

## 2023-06-02 MED ORDER — PANTOPRAZOLE SODIUM 40 MG PO TBEC: 40.0000 mg | DELAYED_RELEASE_TABLET | Freq: Every day | ORAL | 0 refills | Status: DC

## 2023-06-02 MED ORDER — POTASSIUM CHLORIDE CRYS ER 20 MEQ PO TBCR
40.0000 meq | EXTENDED_RELEASE_TABLET | Freq: Two times a day (BID) | ORAL | Status: DC
  Administered 2023-06-02: 40 meq via ORAL
  Filled 2023-06-02: qty 2

## 2023-06-02 NOTE — Progress Notes (Signed)
Mobility Specialist Progress Note:    06/02/23 1011  Mobility  Activity Transferred from chair to bed  Level of Assistance Minimal assist, patient does 75% or more  Assistive Device Other (Comment) (HHA)  Distance Ambulated (ft) 5 ft  Activity Response Tolerated well  Mobility Referral Yes  $Mobility charge 1 Mobility  Mobility Specialist Start Time (ACUTE ONLY) 0845  Mobility Specialist Stop Time (ACUTE ONLY) 0900  Mobility Specialist Time Calculation (min) (ACUTE ONLY) 15 min   Pt received in chair requesting to get back in bed. Pt needed MinA, HHA to get back to bed. Was able to take a couple steps w/ fault. Situated in bed w/ call bell and personal belongings in reach.  Bed alarm on.   Thompson Grayer Mobility Specialist  Please contact vis Secure Chat or  Rehab Office [phone]

## 2023-06-02 NOTE — Progress Notes (Signed)
Pt IV and tele discontinued. AVS given to pt husband. Pt and pt husband educated on medications, follow up apts, and discharge instructions. Deny any questions or concerns at this time. Wheelchair off unit

## 2023-06-02 NOTE — Evaluation (Signed)
Occupational Therapy Evaluation Patient Details Name: JACIANA CLEMON MRN: [id number] DOB: 06-26-1956 Today's Date: 06/02/2023   History of Present Illness 67 yo female admitted 11/21 with dyspnea and repiratory failure requiring bipap. PMhx: COPD on home 3-4L, CHF, DM, smoker, polysubstance abuse, bipolar, RLS, HTN, HLD, LUL NSCLC s/p XRT.   Clinical Impression   PTA, pt lives with husband, typically ambulatory without AD vs intermittent RW use and able to manage ADLs aside from showering assist. Pt presents now with deficits in cognition, cardiopulmonary tolerance, strength and dynamic balance. Overall, pt requires Min A- CGA for bed mobility and BSC transfers; no RW available in room at time of eval. Pt requires Setup for UB ADL and CGA for LB ADLs. Pt requiring cues for sequencing, awareness and reorientation to time/situation during session. Recommend HHOT follow up at DC.       If plan is discharge home, recommend the following: A little help with bathing/dressing/bathroom;Assistance with cooking/housework;Direct supervision/assist for medications management;Direct supervision/assist for financial management;Assist for transportation;Supervision due to cognitive status    Functional Status Assessment  Patient has had a recent decline in their functional status and demonstrates the ability to make significant improvements in function in a reasonable and predictable amount of time.  Equipment Recommendations  None recommended by OT    Recommendations for Other Services       Precautions / Restrictions Precautions Precautions: Fall;Other (comment) Precaution Comments: monitor O2 (wears 4 L O2 at baseline) Restrictions Weight Bearing Restrictions: No      Mobility Bed Mobility Overal bed mobility: Needs Assistance Bed Mobility: Supine to Sit     Supine to sit: Min assist, HOB elevated     General bed mobility comments: handheld assist to lift trunk     Transfers Overall transfer level: Needs assistance Equipment used: 1 person hand held assist Transfers: Sit to/from Stand, Bed to chair/wheelchair/BSC Sit to Stand: Contact guard assist     Step pivot transfers: Contact guard assist     General transfer comment: to/from [facility]. to recliner afterwards      Balance Overall balance assessment: Needs assistance Sitting-balance support: Bilateral upper extremity supported, Feet supported Sitting balance-Leahy Scale: Fair     Standing balance support: No upper extremity supported, During functional activity, Single extremity supported Standing balance-Leahy Scale: Fair                             ADL either performed or assessed with clinical judgement   ADL Overall ADL's : Needs assistance/impaired Eating/Feeding: Set up;Sitting Eating/Feeding Details (indicate cue type and reason): to open milk carton Grooming: Set up;Sitting   Upper Body Bathing: Sitting;Set up   Lower Body Bathing: Contact guard assist;Sitting/lateral leans;Sit to/from stand   Upper Body Dressing : Set up;Sitting   Lower Body Dressing: Contact guard assist;Sitting/lateral leans;Sit to/from stand   Toilet Transfer: Geophysicist/field seismologist Details (indicate cue type and reason): to/from [facility] without AD, no LOB. assist for line mgmt (no RW available in room) Toileting- Clothing Manipulation and Hygiene: Contact guard assist;Sit to/from stand;Sitting/lateral lean Toileting - Clothing Manipulation Details (indicate cue type and reason): CGA for safety w/ clothing mgmt but able to manage peri care without assist             Vision Baseline Vision/History: 1 Wears glasses Ability to See in Adequate Light: 0 Adequate Patient Visual Report: No change from baseline Vision Assessment?: Wears glasses for reading  Perception         Praxis         Pertinent Vitals/Pain Pain Assessment Pain Assessment:  No/denies pain     Extremity/Trunk Assessment Upper Extremity Assessment Upper Extremity Assessment: Generalized weakness;Right hand dominant   Lower Extremity Assessment Lower Extremity Assessment: Defer to PT evaluation   Cervical / Trunk Assessment Cervical / Trunk Assessment: Kyphotic   Communication Communication Communication: No apparent difficulties Cueing Techniques: Verbal cues   Cognition Arousal: Alert Behavior During Therapy: Flat affect Overall Cognitive Status: Impaired/Different from baseline Area of Impairment: Orientation, Attention, Memory, Following commands, Safety/judgement, Problem solving                 Orientation Level: Disoriented to, Time, Place Current Attention Level: Selective Memory: Decreased short-term memory Following Commands: Follows one step commands consistently Safety/Judgement: Decreased awareness of safety   Problem Solving: Slow processing, Difficulty sequencing General Comments: required reorientation to time of day, tangential conversation at times but appropriate with direct questions. showing some insight into weakness and correction of misidentification of items on breakfast tray     General Comments  96% on 4 L O2, BP WFL and HR stable    Exercises     Shoulder Instructions      Home Living Family/patient expects to be discharged to:: Private residence Living Arrangements: Spouse/significant other Available Help at Discharge: Family;Available 24 hours/day Type of Home: Mobile home Home Access: Stairs to enter Entrance Stairs-Number of Steps: 2 Entrance Stairs-Rails: Right;Left;Can reach both Home Layout: One level     Bathroom Shower/Tub: Chief Strategy Officer: Standard Bathroom Accessibility: Yes   Home Equipment: Pharmacist, hospital (2 wheels);Rollator (4 wheels)   Additional Comments: wears 4L O2 baseline      Prior Functioning/Environment Prior Level of Function :  Independent/Modified Independent             Mobility Comments: Typically independent without DME, intermittent use of RW if needed. ADLs Comments: Assisted with showers but able to manage other ADLs without assist        OT Problem List: Decreased strength;Decreased activity tolerance;Impaired balance (sitting and/or standing);Decreased cognition;Decreased safety awareness;Decreased knowledge of use of DME or AE;Cardiopulmonary status limiting activity      OT Treatment/Interventions: Self-care/ADL training;Therapeutic exercise;Energy conservation;DME and/or AE instruction;Therapeutic activities;Patient/family education;Balance training    OT Goals(Current goals can be found in the care plan section) Acute Rehab OT Goals Patient Stated Goal: home soon OT Goal Formulation: With patient Time For Goal Achievement: 06/16/23 Potential to Achieve Goals: Good ADL Goals Pt Will Perform Lower Body Dressing: with modified independence;sit to/from stand;sitting/lateral leans Pt Will Transfer to Toilet: with modified independence;ambulating Additional ADL Goal #1: Pt to verbalize at least 3 energy conservation strategies to implement at home. Additional ADL Goal #2: Pt to complete 3 step trail making task with no more than min verbal cues  OT Frequency: Min 1X/week    Co-evaluation              AM-PAC OT "6 Clicks" Daily Activity     Outcome Measure Help from another person eating meals?: None Help from another person taking care of personal grooming?: A Little Help from another person toileting, which includes using toliet, bedpan, or urinal?: A Little Help from another person bathing (including washing, rinsing, drying)?: A Little Help from another person to put on and taking off regular upper body clothing?: A Little Help from another person to put on and taking off regular lower body  clothing?: A Little 6 Click Score: 19   End of Session Equipment Utilized During Treatment:  Oxygen Nurse Communication: Mobility status;Other (comment) (poor appetite, urination on BSC)  Activity Tolerance: Patient tolerated treatment well Patient left: in chair;with call bell/phone within reach;with chair alarm set  OT Visit Diagnosis: Unsteadiness on feet (R26.81);Other abnormalities of gait and mobility (R26.89);Muscle weakness (generalized) (M62.81)                Time: 6295-2841 OT Time Calculation (min): 23 min Charges:  OT General Charges $OT Visit: 1 Visit OT Evaluation $OT Eval Low Complexity: 1 Low  Bradd Canary, OTR/L Acute Rehab Services Office: [phone]   Lorre Munroe 06/02/2023, 7:57 AM

## 2023-06-02 NOTE — Progress Notes (Signed)
   06/02/23 0023  BiPAP/CPAP/SIPAP  $ Non-Invasive Ventilator  Non-Invasive Vent Subsequent  BiPAP/CPAP/SIPAP Pt Type Adult  BiPAP/CPAP/SIPAP DREAMSTATIOND  Mask Type Full face mask  Mask Size Small  IPAP 15 cmH20  EPAP 5 cmH2O  Patient Home Equipment No  Auto Titrate No   Pt asleep and tolerating bipap welll.  RT will continue to monitor

## 2023-06-02 NOTE — Discharge Summary (Signed)
Physician Discharge Summary   Patient: [name] MRN: [id number] DOB: [birth year]  Admit date:     05/26/2023  Discharge date: 06/02/2023  Discharge Physician: Marguerita Merles   PCP: Kara Pacer, NP   Recommendations at discharge:   Follow-up with PCP within 1 to 2 weeks and repeat CBC, CMP, mag, Phos within 1 week Follow-up with pulmonology in outpatient setting within 1 to 2 weeks and repeat chest x-ray in 3 to 6 weeks Will need outpatient endocrinology evaluation for her hypercalcemia which is chronic and will need follow-up confirmatory testing with consideration for planning for surgical correction as well Follow-up with cardiology in outpatient setting and continue to monitor for signs and symptoms of volume overload  Discharge Diagnoses: Principal Problem:   Acute on chronic respiratory failure with hypoxia and hypercapnia (HCC) Active Problems:   Acute metabolic encephalopathy   Essential hypertension   COPD exacerbation (HCC)   Obesity (BMI 30-39.9)   Chronic pain   Cigarette smoker   Bipolar disorder in partial remission (HCC)   Atherosclerosis of coronary artery without angina pectoris  Resolved Problems:   * No resolved hospital problems. *  Hospital Course: 67 year old with a history of COPD on chronic O2 support, LUL NSCLC status post XRT, tobacco abuse, cocaine abuse, bipolar disorder, DM2, RLS, and diastolic CHF who [facility]21 with a few days of worsening dyspnea and hypoxia in the 70s despite increasing her home oxygen from her baseline of 4 L up to 6 L.  In the ER she was found to be hypoxic and hypercapnic.  Chest x-ray was without acute findings.  She was placed on BiPAP and transferred to [facility].   Significant Events: 11/21 admitted in respiratory failure 11/23 continues to require BiPAP 11/25 [facility] assumed care of patient  **Respiratory status is slowly improving but will need continued treatment.  Ostium  improved as well.  Patient felt at her baseline and was on her baseline oxygen.  She felt well and is medically stable for discharge at this time and will need to follow-up with PCP, endocrinology, cardiology and pulmonology in outpatient setting   Assessment and Plan:  Acute exacerbation of chronic hypoxic and hypercarbic respiratory failure -acute COPD exacerbation, improved and back to Baseline -Continues to require BiPAP at night and during periods of rest  -on 4 L nasal cannula oxygen support at baseline at home (though hx suggests she is frequently not compliant with this)  SpO2: 96 % O2 Flow Rate (L/min): 4 L/min FiO2 (%): 45 % -being treated with usual inhaled medications and systemic steroids which are now on po Steroid Taper -C/w albuterol 2.5 mg neb every 2 as needed for wheezing and shortness of breath as well as budesonide 0.5 mg IH twice daily and doxycycline 100 mL p.o. every 12 -Continue DuoNeb 3 mL twice daily -WBC Trend: Recent Labs  Lab 05/26/23 1401 05/27/23 0339 05/27/23 1120 05/30/23 0307 06/01/23 0226 06/02/23 0224  WBC 13.0* 6.6 13.6* 19.8* 13.5* 14.5*  -Will add flutter valve, incentive spirometry and guaifenesin 1200 mg p.o. twice daily -Repeat CXR in 3-6 weeks and follow up with Pulonary in the outpatient setting    Acute Metabolic Encephalopathy due to hypercarbic respiratory failure -Rapidly resolving with improvement in respiratory function  Hypokalemia -Patient's K+ Level Trend: Recent Labs  Lab 05/27/23 1120 05/29/23 0328 05/29/23 1421 05/30/23 0307 05/31/23 0239 06/01/23 0226 06/02/23 0224  K 3.5 2.9* 4.4 3.5 2.8* 3.9 3.2*  -Replete with po Kcl  40 mEQ BID x2 -Continue to Monitor and Replete as Necessary -Repeat CMP within 1 week  Hypophosphatemia -Phos Level Trend: Recent Labs  Lab 05/31/23 0239 06/01/23 0226 06/02/23 0224  PHOS 2.3* 1.8* 2.7  -Replete with po K Phos 500 mg BID x2 -Continue to Monitor and Replete as  Necessary -Repeat Phos Level in the AM   Hypomagnesemia -Magnesium Level Trend: Recent Labs  Lab 05/31/23 0239 05/31/23 1202 06/01/23 0226 06/02/23 0224  MG 0.9* 1.9 1.7 1.6*  -Replete with IV Mag Sulfate 2 grams prior to D/C -Continue to Monitor and Replete as Necessary -Repeat CMP within 1 week   Restless Leg Syndrome -Continue usual ropinirole per home dose with ropinirole 3 mg p.o. nightly   Polysubstance abuse to include cocaine and tobacco -Educated on absolute need for cessation -Currently on Nicotine 21 mg Transdermal Patch q. 24   Chronic Hypercalcemia -Records suggest this has been present for at least 2 years -suspicious for primary hyperparathyroidism  -intact PTH elevated at 99  -ionized calcium elevated at 7.6  -Calcium Level Trend: Recent Labs  Lab 05/29/23 0328 05/29/23 1421 05/30/23 0307 05/31/23 0239 06/01/23 0226 06/02/23 0224  CALCIUM 13.4* 13.3*  13.2* 13.1* 13.1* 11.8* 11.1*  -There is concern that her waxing and waning lethargy may be a result of this finding therefore today Dr. Sharon Seller decided to initiate treatment and monitor and she is now on calcitonin 292 units subcu twice daily for 4 doses and this is complete -Will need outpatient Endocrinology evaluation suggested with follow-up confirmatory testing to consider planning for surgical correction   DM2 -C/w Moderate Novolog SSI AC -CBG Trend: Recent Labs  Lab 06/01/23 1554 06/01/23 1937 06/01/23 2340 06/02/23 0425 06/02/23 0800 06/02/23 1133 06/02/23 1516  GLUCAP 181* 159* 99 113* 148* 278* 128*    Chronic Diastolic CHF -Prior reported history of systolic CHF but TTE March 2024 noted normalized EF at 65-70% -Strict I's and O's and Daily Weights No intake or output data in the 24 hours ending 06/03/23 1822 -C/w Metoprolol Tartrate 12.5 mg po BID -Continue to Monitor for S/Sx of Volume Overload and follow up with Cardiology in the outpatient setting   Normocytic Anemia -Hgb/Hct  Trend: Recent Labs  Lab 05/26/23 1401 05/27/23 0339 05/27/23 1120 05/30/23 0307 06/01/23 0226 06/02/23 0224  HGB 10.2* 9.5* 9.7* 9.8* 10.0* 9.5*  HCT 33.6* 30.4* 31.4* 31.2* 32.6* 30.7*  MCV 96.0 93.8 92.9 91.8 92.6 94.5  -Checked Anemia Panel and showed an iron level of 45, UIBC of 248, TIBC of 298, -Continue to Monitor for S/Sx of Bleeding; No overt bleeding noted -Repeat CBC within 1 week   Bipolar Disorder/Severe Depression -Continue usual home medications with Olanzapine 5 mg po Daily   GERD/GI prophylaxis -Continue with Pantoprazole 40 mg p.o. daily  Hypoalbuminemia -Patient's Albumin Trend: Recent Labs  Lab 05/26/23 1401 05/29/23 1421 05/31/23 0239 06/02/23 0224  ALBUMIN 3.3* 2.9* 2.5* 2.7*  -Continue to Monitor and Trend and repeat CMP in the AM  Consultants: PCCM transfer Procedures performed: As delineated as above Disposition: Home health Diet recommendation:  Cardiac diet DISCHARGE MEDICATION: Allergies as of 06/02/2023       Reactions   Septra [sulfamethoxazole-trimethoprim] Other (See Comments)   Patient is unsure if she allergic to septra or penicillin. Patient states one or another caused "rib pain with a little breathing problem".   Penicillins Other (See Comments)   Patient is unsure if she allergic to penicillin or septra. Patient states one or another caused "rib  pain with a little breathing problem". Tolerates cephalosporins        Medication List     STOP taking these medications    omeprazole 20 MG capsule Commonly known as: PRILOSEC       TAKE these medications    acetaminophen 500 MG tablet Commonly known as: TYLENOL Take 1,000 mg by mouth every 6 (six) hours as needed for moderate pain, fever or headache.   albuterol 108 (90 Base) MCG/ACT inhaler Commonly known as: VENTOLIN HFA Inhale 1-2 puffs into the lungs every 6 (six) hours as needed for wheezing or shortness of breath. What changed: Another medication with the same  name was changed. Make sure you understand how and when to take each.   albuterol (2.5 MG/3ML) 0.083% nebulizer solution Commonly known as: PROVENTIL INHALE 1 VIAL VIA NEBULIZER EVERY 4 HOURS What changed: See the new instructions.   amLODipine 10 MG tablet Commonly known as: NORVASC Take 1 tablet (10 mg total) by mouth daily.   ammonium lactate 12 % cream Commonly known as: AMLACTIN Apply 1 Application topically as needed for dry skin.   Breztri Aerosphere 160-9-4.8 MCG/ACT Aero Generic drug: Budeson-Glycopyrrol-Formoterol Inhale 2 puffs into the lungs in the morning and at bedtime.   ipratropium-albuterol 0.5-2.5 (3) MG/3ML Soln Commonly known as: DUONEB Take 3 mLs by nebulization every 4 (four) hours as needed.   Combivent Respimat 20-100 MCG/ACT Aers respimat Generic drug: Ipratropium-Albuterol Inhale 1 puff into the lungs every 6 (six) hours as needed for wheezing.   diclofenac Sodium 1 % Gel Commonly known as: VOLTAREN Apply 1 Application topically 4 (four) times daily as needed (leg pain).   doxycycline 100 MG tablet Commonly known as: VIBRA-TABS Take 1 tablet (100 mg total) by mouth every 12 (twelve) hours for 6 doses.   DULoxetine 20 MG capsule Commonly known as: CYMBALTA Take 40 mg by mouth daily.   famotidine 20 MG tablet Commonly known as: Pepcid One after supper   feeding supplement Liqd Take 237 mLs by mouth 2 (two) times daily between meals.   fluorometholone 0.1 % ophthalmic suspension Commonly known as: FML 1 drop See admin instructions.   guaiFENesin 600 MG 12 hr tablet Commonly known as: MUCINEX Take 1 tablet (600 mg total) by mouth 2 (two) times daily for 5 days.   metFORMIN 1000 MG tablet Commonly known as: GLUCOPHAGE Take 1 tablet (1,000 mg total) by mouth 2 (two) times daily.   metoprolol tartrate 25 MG tablet Commonly known as: LOPRESSOR Take 0.5 tablets (12.5 mg total) by mouth 2 (two) times daily.   nicotine 21 mg/24hr  patch Commonly known as: NICODERM CQ - dosed in mg/24 hours Place 1 patch (21 mg total) onto the skin daily.   nitroGLYCERIN 0.4 MG SL tablet Commonly known as: NITROSTAT Place 0.4 mg under the tongue every 5 (five) minutes as needed for chest pain.   OLANZapine 5 MG tablet Commonly known as: ZYPREXA Take 5 mg by mouth daily.   ondansetron 4 MG tablet Commonly known as: ZOFRAN Take 4 mg by mouth every 8 (eight) hours as needed for nausea or vomiting.   pantoprazole 40 MG tablet Commonly known as: PROTONIX Take 1 tablet (40 mg total) by mouth daily. What changed: additional instructions   predniSONE 20 MG tablet Commonly known as: DELTASONE Take 2 tablets (40 mg total) by mouth daily with breakfast for 3 days, THEN 1 tablet (20 mg total) daily with breakfast for 5 days. Start taking on: June 03, 2023  Restasis 0.05 % ophthalmic emulsion Generic drug: cycloSPORINE Place 1 drop into both eyes 2 (two) times daily.   rOPINIRole 2 MG tablet Commonly known as: REQUIP Take 3 mg by mouth at bedtime.   rosuvastatin 10 MG tablet Commonly known as: CRESTOR Take 10 mg by mouth every evening.   sodium chloride 0.65 % Soln nasal spray Commonly known as: OCEAN Place 1 spray into both nostrils as needed for congestion.   SUMAtriptan 25 MG tablet Commonly known as: IMITREX Take 25 mg by mouth every 2 (two) hours as needed for migraine.   torsemide 20 MG tablet Commonly known as: DEMADEX Take 1 tablet (20 mg total) by mouth daily as needed. May take an additional 20 mg daily as needed for swelling.        Follow-up Information     [facility] Follow up.   Specialty: Home Health Services Why: Agency will call you to set up apt times Contact information: [address] [place] [address] [phone]         Kara Pacer, NP. Call.   Why: Follow up within 1-2 weeks Contact information: [address] [place]  [place] [address] [phone]                Discharge Exam: Ceasar Mons Weights   05/30/23 5784 05/31/23 0552 06/02/23 0525  Weight: 73.8 kg 73.1 kg 71.9 kg   Vitals:   06/02/23 1136 06/02/23 1517  BP: (!) 111/49 126/72  Pulse: 76 83  Resp: 20 19  Temp: 97.6 F (36.4 C) 97.9 F (36.6 C)  SpO2: 95% 96%   Examination: Physical Exam:  Constitutional: Elderly overweight Caucasian female in no acute distress at her baseline Respiratory: Diminished to auscultation bilaterally with some coarse breath sounds, no wheezing, rales, rhonchi or crackles. Normal respiratory effort and patient is not tachypenic. No accessory muscle use.  Unlabored breathing Cardiovascular: RRR, no murmurs / rubs / gallops. S1 and S2 auscultated. No extremity edema.  Abdomen: Soft, non-tender, slightly distended secondary to body habitus. Bowel sounds positive.  GU: Deferred. Musculoskeletal: No clubbing / cyanosis of digits/nails. No joint deformity upper and lower extremities.  Skin: No rashes, lesions, ulcers or limited skin evaluation. No induration; Warm and dry.  Neurologic: CN 2-12 grossly intact with no focal deficits. Romberg sign and cerebellar reflexes not assessed.  Psychiatric: Normal judgment and insight. Alert and oriented x 3. Normal mood and appropriate affect.   Condition at discharge: stable  The results of significant diagnostics from this hospitalization (including imaging, microbiology, ancillary and laboratory) are listed below for reference.   Imaging Studies: DG CHEST PORT 1 VIEW  Result Date: 06/02/2023 CLINICAL DATA:  67 year old female with history of shortness of breath. EXAM: PORTABLE CHEST 1 VIEW COMPARISON:  Chest x-ray 05/26/2023. FINDINGS: Lung volumes are normal. No confluent consolidative airspace disease. However, there is diffuse interstitial prominence and widespread peribronchial cuffing scattered throughout the lungs bilaterally, very similar to the prior study, although  slightly worsened aeration is noted in the region of the right upper lobe where there is also some ill-defined opacities which could be indicative of developing airspace disease. No pneumothorax. No pleural effusions. No evidence of pulmonary edema. Heart size is upper limits of normal. Upper mediastinal contours are within normal limits. Atherosclerotic calcifications are noted in the thoracic aorta. IMPRESSION: 1. The appearance of the chest is concerning for probable bronchitis and developing bronchopneumonia (most evident in the right upper lobe), as above.  2. Aortic atherosclerosis. Electronically Signed   By: Trudie Reed M.D.   On: 06/02/2023 09:03   DG Chest Port 1 View  Result Date: 05/26/2023 CLINICAL DATA:  Shortness of breath.  History of COPD. EXAM: PORTABLE CHEST 1 VIEW COMPARISON:  04/16/2023. FINDINGS: Redemonstration of increased interstitial markings throughout bilateral lungs, essentially similar to the prior study. There is a faint 9 x 15 mm opacity overlying the left upper lung zone, laterally, which corresponds to the nodule seen on the prior chest CT scan from 01/28/2023. Accurate comparison is not possible due to difference in technique. Bilateral lung fields are otherwise clear. No acute consolidation or lung collapse. Bilateral costophrenic angles are clear. No pleural effusion. Stable cardio-mediastinal silhouette. No acute osseous abnormalities. Subacute/healing posterior left eighth rib fracture noted. The soft tissues are within normal limits. IMPRESSION: *No acute cardiopulmonary abnormality. *Redemonstration of increased interstitial markings, similar to the prior study. No frank pulmonary edema. *Redemonstration of 9 x 15 mm nodule overlying the left upper lung zone, laterally, which corresponds to the nodule seen on the prior chest CT scan from 01/28/2023. Electronically Signed   By: Jules Schick M.D.   On: 05/26/2023 15:24    Microbiology: Results for orders placed or  performed during the hospital encounter of 05/26/23  Resp panel by RT-PCR (RSV, Flu A&B, Covid) Anterior Nasal Swab     Status: None   Collection Time: 05/26/23  1:44 PM   Specimen: Anterior Nasal Swab  Result Value Ref Range Status   SARS Coronavirus 2 by RT PCR NEGATIVE NEGATIVE Final    Comment: (NOTE) SARS-CoV-2 target nucleic acids are NOT DETECTED.  The SARS-CoV-2 RNA is generally detectable in upper respiratory specimens during the acute phase of infection. The lowest concentration of SARS-CoV-2 viral copies this assay can detect is 138 copies/mL. A negative result does not preclude SARS-Cov-2 infection and should not be used as the sole basis for treatment or other patient management decisions. A negative result may occur with  improper specimen collection/handling, submission of specimen other than nasopharyngeal swab, presence of viral mutation(s) within the areas targeted by this assay, and inadequate number of viral copies(<138 copies/mL). A negative result must be combined with clinical observations, patient history, and epidemiological information. The expected result is Negative.  Fact Sheet for Patients:  BloggerCourse.com  Fact Sheet for Healthcare Providers:  SeriousBroker.it  This test is no t yet approved or cleared by the [place] FDA and  has been authorized for detection and/or diagnosis of SARS-CoV-2 by FDA under an Emergency Use Authorization (EUA). This EUA will remain  in effect (meaning this test can be used) for the duration of the COVID-19 declaration under Section 564(b)(1) of the Act, 21 U.S.C.section 360bbb-3(b)(1), unless the authorization is terminated  or revoked sooner.       Influenza A by PCR NEGATIVE NEGATIVE Final   Influenza B by PCR NEGATIVE NEGATIVE Final    Comment: (NOTE) The Xpert Xpress SARS-CoV-2/FLU/RSV plus assay is intended as an aid in the diagnosis of influenza from  Nasopharyngeal swab specimens and should not be used as a sole basis for treatment. Nasal washings and aspirates are unacceptable for Xpert Xpress SARS-CoV-2/FLU/RSV testing.  Fact Sheet for Patients: BloggerCourse.com  Fact Sheet for Healthcare Providers: SeriousBroker.it  This test is not yet approved or cleared by the [place] FDA and has been authorized for detection and/or diagnosis of SARS-CoV-2 by FDA under an Emergency Use Authorization (EUA). This EUA will remain in effect (meaning this  test can be used) for the duration of the COVID-19 declaration under Section 564(b)(1) of the Act, 21 U.S.C. section 360bbb-3(b)(1), unless the authorization is terminated or revoked.     Resp Syncytial Virus by PCR NEGATIVE NEGATIVE Final    Comment: (NOTE) Fact Sheet for Patients: BloggerCourse.com  Fact Sheet for Healthcare Providers: SeriousBroker.it  This test is not yet approved or cleared by the [place] FDA and has been authorized for detection and/or diagnosis of SARS-CoV-2 by FDA under an Emergency Use Authorization (EUA). This EUA will remain in effect (meaning this test can be used) for the duration of the COVID-19 declaration under Section 564(b)(1) of the Act, 21 U.S.C. section 360bbb-3(b)(1), unless the authorization is terminated or revoked.  Performed at [facility], [address], [place], [place] [address]   MRSA Next Gen by PCR, Nasal     Status: None   Collection Time: 05/27/23 11:05 AM   Specimen: Nasal Mucosa; Nasal Swab  Result Value Ref Range Status   MRSA by PCR Next Gen NOT DETECTED NOT DETECTED Final    Comment: (NOTE) The GeneXpert MRSA Assay (FDA approved for NASAL specimens only), is one component of a comprehensive MRSA colonization surveillance program. It is not intended to diagnose MRSA infection nor to guide or monitor treatment for  MRSA infections. Test performance is not FDA approved in patients less than 26 years old. Performed at [facility], [address], [place], [place] [address]    *Note: Due to a large number of results and/or encounters for the requested time period, some results have not been displayed. A complete set of results can be found in Results Review.   Labs: CBC: Recent Labs  Lab 05/30/23 0307 06/01/23 0226 06/02/23 0224  WBC 19.8* 13.5* 14.5*  NEUTROABS  --   --  10.6*  HGB 9.8* 10.0* 9.5*  HCT 31.2* 32.6* 30.7*  MCV 91.8 92.6 94.5  PLT 275 247 261   Basic Metabolic Panel: Recent Labs  Lab 05/29/23 1421 05/30/23 0307 05/31/23 0239 05/31/23 1202 06/01/23 0226 06/02/23 0224  NA 137 135 142  --  141 139  K 4.4 3.5 2.8*  --  3.9 3.2*  CL 97* 96* 101  --  101 96*  CO2 29 33* 36*  --  35* 38*  GLUCOSE 247* 138* 90  --  110* 100*  BUN 17 17 14   --  12 12  CREATININE 0.75 0.60 0.69  --  0.60 0.53  CALCIUM 13.3*  13.2* 13.1* 13.1*  --  11.8* 11.1*  MG  --   --  0.9* 1.9 1.7 1.6*  PHOS  --   --  2.3*  --  1.8* 2.7   Liver Function Tests: Recent Labs  Lab 05/29/23 1421 05/31/23 0239 06/02/23 0224  AST 15 14* 13*  ALT 13 11 12   ALKPHOS 69 59 55  BILITOT 0.9 0.4 0.4  PROT 5.8* 4.8* 4.9*  ALBUMIN 2.9* 2.5* 2.7*   CBG: Recent Labs  Lab 06/01/23 2340 06/02/23 0425 06/02/23 0800 06/02/23 1133 06/02/23 1516  GLUCAP 99 113* 148* 278* 128*   Discharge time spent: greater than 30 minutes.  Signed: Marguerita Merles, DO [facility] 06/03/2023

## 2023-06-06 ENCOUNTER — Telehealth: Payer: Self-pay | Admitting: Cardiology

## 2023-06-06 ENCOUNTER — Telehealth: Payer: Self-pay | Admitting: Internal Medicine

## 2023-06-06 NOTE — Telephone Encounter (Signed)
Line just rings then says call cannot be completed as dialed.

## 2023-06-06 NOTE — Telephone Encounter (Signed)
Patient does not have a scale at home,just weighs at [employer]. She says she didn't eat today except to have corn chips. She also said she uses a salt shaker "not a lot" . We talked about using Mrs.Dash instead and to avoid all salt . She has not taken torsemide 20 mg until today and says she did not urinate a lot. She will take another dose tomorrow if her swelling remains and call us back with update.

## 2023-06-06 NOTE — Telephone Encounter (Signed)
Patient called and wanted to schedule an appointment with Dr. Peterson Lombard has been mailed a warning letter due to cancellations / No Shows.  Patient states she has swelling in her feet and ankles,  clinical staff reviewed her chart and patient was advised to call her cardiologist since she had spoken with their office before regarding this.  She voiced her understanding.

## 2023-06-06 NOTE — Telephone Encounter (Signed)
Patient is returning call.  °

## 2023-06-06 NOTE — Telephone Encounter (Signed)
Pt c/o swelling: STAT is pt has developed SOB within 24 hours  How much weight have you gained and in what time span? Not sure   If swelling, where is the swelling located? In pt's feet and ankles   Are you currently taking a fluid pill? Yes  Are you currently SOB? No  Do you have a log of your daily weights (if so, list)? No  Have you gained 3 pounds in a day or 5 pounds in a week? Not sure   Have you traveled recently? No

## 2023-06-12 ENCOUNTER — Other Ambulatory Visit: Payer: Self-pay | Admitting: Pulmonary Disease

## 2023-06-12 ENCOUNTER — Other Ambulatory Visit (INDEPENDENT_AMBULATORY_CARE_PROVIDER_SITE_OTHER): Payer: Self-pay | Admitting: Gastroenterology

## 2023-06-12 DIAGNOSIS — B370 Candidal stomatitis: Secondary | ICD-10-CM

## 2023-06-12 DIAGNOSIS — R1319 Other dysphagia: Secondary | ICD-10-CM

## 2023-06-14 ENCOUNTER — Telehealth (INDEPENDENT_AMBULATORY_CARE_PROVIDER_SITE_OTHER): Payer: Self-pay | Admitting: *Deleted

## 2023-06-14 ENCOUNTER — Other Ambulatory Visit (INDEPENDENT_AMBULATORY_CARE_PROVIDER_SITE_OTHER): Payer: Self-pay | Admitting: *Deleted

## 2023-06-14 MED ORDER — OMEPRAZOLE 20 MG PO CPDR: 20.0000 mg | DELAYED_RELEASE_CAPSULE | Freq: Every day | ORAL | 1 refills | Status: DC

## 2023-06-14 NOTE — Telephone Encounter (Signed)
Fax from [facility] requesting refil on omeprazole 20mg  daily. Called pt to clarify because omeprazole is not on med list. It was changed at hospital visit to pantoprazole 40mg . I called patient and she said she was still taking omeprazole and never got the pantoprazole. She did not know why it was changed but states she is doing well with omeprazole. Can she continue on omeprazole or should she switch?

## 2023-06-14 NOTE — Telephone Encounter (Signed)
Discussed with pateint per Dr. Tasia Catchings:  She can continue taking Omeprazole results for esophagogram patient recently had. Inform the patient that the choking and gagging sensation she is having after eating is most likely because she is aspirating which means the food instead of going into the food pipe is going into the lungs.  I recommend the patient follow-up with speech and swallow therapist, ENT and her PCP   Patient verbalized understanding and numbers given to her for ent referral that was sent [phone] and speech and swallow therapy [phone]. Omeprazole sent to pharm with a note to pharm to cancel pantoprazole.

## 2023-06-16 ENCOUNTER — Telehealth: Payer: Self-pay | Admitting: Pulmonary Disease

## 2023-06-16 NOTE — Telephone Encounter (Signed)
We have sent her d/c papers for missing appts but I am happy to offer to see her for acute evaluations for 30 days  - she must come in person and bring all meds with her.  I can see her tomorrow as a work in or she can go to [facility]

## 2023-06-16 NOTE — Telephone Encounter (Signed)
PT wondering if she can get some antibx. She is not feeling well and subject to pnumonia. Her # is [phone]  Ilda Basset is [facility]

## 2023-06-16 NOTE — Telephone Encounter (Signed)
Primary Pulmonologist: Wert Last office visit and with whom: 04/05/2023 Wert  What do we see them for (pulmonary problems): COPD gold 2, chronic hypoxemic respiratory failure, current smoker Last OV assessment/plan:  Instructions  Make sure you check your oxygen saturation  AT  your highest level of activity (not after you stop)   to be sure it stays over 90% and adjust  02 flow upward to maintain this level if needed but remember to turn it back to previous settings when you stop (to conserve your supply).      Plan A = Automatic = Always=    Breztri Take 2 puffs first thing in am and then another 2 puffs about 12 hours later.    Work on inhaler technique:  relax and gently blow all the way out then take a nice smooth full deep breath back in, triggering the inhaler at same time you start breathing in.  Hold breath in for at least  5 seconds if you can. Blow out breztri  thru nose. Rinse and gargle with water when done.  If mouth or throat bother you at all,  try brushing teeth/gums/tongue with arm and hammer toothpaste/ make a slurry and gargle and spit out.    >>>  Remember how golfers warm up by taking practice swings - do this with an empty inhaler      Plan B = Backup (to supplement plan A, not to replace it) Only use your albuterol inhaler as a rescue medication to be used if you can't catch your breath by resting or doing a relaxed purse lip breathing pattern.  - The less you use it, the better it will work when you need it. - Ok to use the inhaler up to 2 puffs  every 4 hours if you must but call for appointment if use goes up over your usual need - Don't leave home without it !!  (think of it like the spare tire for your car)    Plan C = Crisis (instead of Plan B but only if Plan B stops working) - only use your albuterol nebulizer if you first try Plan B and it fails to help > ok to use the nebulizer up to every 4 hours but if start needing it regularly call for immediate  appointment     Prednisone 10 mg take  4 each am x 2 days,   2 each am x 2 days,  1 each am x 2 days and stop    Pantoprazole (protonix) 40 mg   Take  30-60 min before first meal of the day and Pepcid (famotidine)  20 mg after supper until return to office - this is the best way to tell whether stomach acid is contributing to your problem.     Make sure you check your oxygen saturation  AT  your highest level of activity (not after you stop)   to be sure it stays over 90% and adjust  02 flow upward to maintain this level if needed but remember to turn it back to previous settings when you stop (to conserve your supply).      Please schedule a follow up office visit in 6 weeks, call sooner if needed with all medications /inhalers/ solutions in hand so we can verify exactly what you are taking. This includes all medications from all doctors and over the counters        Was appointment offered to patient (explain)?  No openings   Reason  for call: Requesting antibiotics.  Not feeling well and prone to pna.  States she has had 3 days  chest congestion with small amount of yellow/green mucous.  More sob than normal.  She takes 3 steps and she is out of breath.  She misplaced her pulse oximeter, so she could not tell me what her oxygen level was at the time of my call.    Oxygen is on 4.5 L, turned up to 5 and did not help.  She has nasal congestion as well with a small amount of yellow/green mucous. She has been using Breztri inhaler and the albuterol nebulizer.  She states she get a little bit of relief from the sob and wheezing with the nebulizer.  She says she has the same symptoms she had when she had pna.  She states she quit smoking 30 days ago.  Dr. Sherene Sires, please advise.  Thank you.  (examples of things to ask: : When did symptoms start? Fever? Cough? Productive? Color to sputum? More sputum than usual? Wheezing? Have you needed increased oxygen? Are you taking your respiratory medications?  What over the counter measures have you tried?)  Allergies  Allergen Reactions   Septra [Sulfamethoxazole-Trimethoprim] Other (See Comments)    Patient is unsure if she allergic to septra or penicillin. Patient states one or another caused "rib pain with a little breathing problem".   Penicillins Other (See Comments)    Patient is unsure if she allergic to penicillin or septra. Patient states one or another caused "rib pain with a little breathing problem". Tolerates cephalosporins    Immunization History  Administered Date(s) Administered   Fluad Quad(high Dose 65+) 04/30/2022   Fluad Trivalent(High Dose 65+) 05/30/2023   Influenza Whole 03/29/2019   Influenza-Unspecified 03/06/2019, 04/13/2021   Moderna Sars-Covid-2 Vaccination 02/22/2020, 04/18/2020   PNEUMOCOCCAL CONJUGATE-20 05/30/2023   Pneumococcal Polysaccharide-23 07/06/2014   Td (Adult),5 Lf Tetanus Toxid, Preservative Free 07/18/1996   Tdap 11/30/2013, 04/16/2023

## 2023-06-16 NOTE — Telephone Encounter (Signed)
Spoke with patient and advised we could see her tomorrow (Fri) at 11:30 in the office. She states she has another appt at 11am and can't come in. I advised per Dr. Sherene Sires she would have to be seen to be evaluated for treatment options. She states she will call back in the morning if she wants to come in.  Arley Phenix, thanks!

## 2023-06-17 ENCOUNTER — Other Ambulatory Visit (HOSPITAL_COMMUNITY): Payer: Self-pay | Admitting: Occupational Therapy

## 2023-06-20 ENCOUNTER — Telehealth: Payer: Self-pay | Admitting: Cardiology

## 2023-06-20 ENCOUNTER — Other Ambulatory Visit: Payer: Self-pay | Admitting: Nurse Practitioner

## 2023-06-20 NOTE — Telephone Encounter (Signed)
Dr. Sherene Sires - patient did not return call for OV scheduling. FYI - thanks!

## 2023-06-20 NOTE — Telephone Encounter (Signed)
Spoke with husband who repots that the pt has swelling to lower legs. The left is greater that the right. Husband does report SOB but is unsure if it is coming from her COPD or not. Pt does not have a scale to weigh at home. Husband does report that she has been taking her torsemide daily for the last week. Please advise.

## 2023-06-20 NOTE — Telephone Encounter (Signed)
Pt c/o swelling/edema: STAT if pt has developed SOB within 24 hours  If swelling, where is the swelling located? Both legs, but left is the worse  How much weight have you gained and in what time span? Not sure  (They don't have a scale)  Have you gained 2 pounds in a day or 5 pounds in a week? No sure  Do you have a log of your daily weights (if so, list)? no  Are you currently taking a fluid pill? yes  Are you currently SOB? Pt has COPD  Have you traveled recently in a car or plane for an extended period of time? no

## 2023-06-21 NOTE — Telephone Encounter (Signed)
Attempted to call patient no answer no voice mail.  

## 2023-06-21 NOTE — Telephone Encounter (Signed)
Clarify taking torsemide 20mg  daily, if so then increase to 40mg  daily for 3 days and update [place] on Friday please  Dominga Ferry MD

## 2023-06-21 NOTE — Telephone Encounter (Signed)
NFN 

## 2023-06-23 NOTE — Telephone Encounter (Signed)
Attempted to call both numbers on pt's chart- number #1 no answer/ no voicemail; #2 no answer/ voicemail full

## 2023-06-24 NOTE — Telephone Encounter (Signed)
Husband was returning call .please advise

## 2023-06-24 NOTE — Telephone Encounter (Signed)
 Attempted to call both numbers on pt's chart- number #1 no answer/ no voicemail; #2 no answer/ voicemail full

## 2023-06-24 NOTE — Telephone Encounter (Signed)
Pt's husband notified and verbalized understanding. Pt's husband had no questions or concerns at this time.

## 2023-06-26 ENCOUNTER — Emergency Department (HOSPITAL_COMMUNITY): Payer: 59

## 2023-06-26 ENCOUNTER — Other Ambulatory Visit: Payer: Self-pay

## 2023-06-26 ENCOUNTER — Inpatient Hospital Stay (HOSPITAL_COMMUNITY)
Admission: EM | Admit: 2023-06-26 | Discharge: 2023-06-29 | DRG: 189 | Disposition: A | Discharge status: Home / Self Care | Payer: 59 | Attending: Internal Medicine | Admitting: Internal Medicine

## 2023-06-26 ENCOUNTER — Encounter (HOSPITAL_COMMUNITY): Payer: Self-pay | Admitting: Emergency Medicine

## 2023-06-26 DIAGNOSIS — Z7951 Long term (current) use of inhaled steroids: Secondary | ICD-10-CM | POA: Diagnosis not present

## 2023-06-26 DIAGNOSIS — E782 Mixed hyperlipidemia: Secondary | ICD-10-CM | POA: Diagnosis present

## 2023-06-26 DIAGNOSIS — F17210 Nicotine dependence, cigarettes, uncomplicated: Secondary | ICD-10-CM | POA: Diagnosis present

## 2023-06-26 DIAGNOSIS — Z79899 Other long term (current) drug therapy: Secondary | ICD-10-CM

## 2023-06-26 DIAGNOSIS — E1165 Type 2 diabetes mellitus with hyperglycemia: Secondary | ICD-10-CM | POA: Diagnosis present

## 2023-06-26 DIAGNOSIS — F17200 Nicotine dependence, unspecified, uncomplicated: Secondary | ICD-10-CM | POA: Diagnosis not present

## 2023-06-26 DIAGNOSIS — R0603 Acute respiratory distress: Secondary | ICD-10-CM | POA: Diagnosis present

## 2023-06-26 DIAGNOSIS — F1910 Other psychoactive substance abuse, uncomplicated: Secondary | ICD-10-CM | POA: Diagnosis not present

## 2023-06-26 DIAGNOSIS — I110 Hypertensive heart disease with heart failure: Secondary | ICD-10-CM | POA: Diagnosis present

## 2023-06-26 DIAGNOSIS — I10 Essential (primary) hypertension: Secondary | ICD-10-CM | POA: Diagnosis present

## 2023-06-26 DIAGNOSIS — I5032 Chronic diastolic (congestive) heart failure: Secondary | ICD-10-CM | POA: Insufficient documentation

## 2023-06-26 DIAGNOSIS — Z9981 Dependence on supplemental oxygen: Secondary | ICD-10-CM

## 2023-06-26 DIAGNOSIS — G9341 Metabolic encephalopathy: Secondary | ICD-10-CM | POA: Diagnosis present

## 2023-06-26 DIAGNOSIS — Z882 Allergy status to sulfonamides status: Secondary | ICD-10-CM | POA: Diagnosis not present

## 2023-06-26 DIAGNOSIS — K219 Gastro-esophageal reflux disease without esophagitis: Secondary | ICD-10-CM | POA: Diagnosis present

## 2023-06-26 DIAGNOSIS — J9620 Acute and chronic respiratory failure, unspecified whether with hypoxia or hypercapnia: Secondary | ICD-10-CM | POA: Diagnosis present

## 2023-06-26 DIAGNOSIS — C3412 Malignant neoplasm of upper lobe, left bronchus or lung: Secondary | ICD-10-CM | POA: Diagnosis present

## 2023-06-26 DIAGNOSIS — J441 Chronic obstructive pulmonary disease with (acute) exacerbation: Secondary | ICD-10-CM | POA: Diagnosis present

## 2023-06-26 DIAGNOSIS — J9622 Acute and chronic respiratory failure with hypercapnia: Secondary | ICD-10-CM | POA: Diagnosis present

## 2023-06-26 DIAGNOSIS — J439 Emphysema, unspecified: Secondary | ICD-10-CM | POA: Diagnosis present

## 2023-06-26 DIAGNOSIS — F419 Anxiety disorder, unspecified: Secondary | ICD-10-CM | POA: Diagnosis present

## 2023-06-26 DIAGNOSIS — Z9049 Acquired absence of other specified parts of digestive tract: Secondary | ICD-10-CM

## 2023-06-26 DIAGNOSIS — E876 Hypokalemia: Secondary | ICD-10-CM | POA: Diagnosis present

## 2023-06-26 DIAGNOSIS — Z923 Personal history of irradiation: Secondary | ICD-10-CM | POA: Diagnosis not present

## 2023-06-26 DIAGNOSIS — Z880 Allergy status to penicillin: Secondary | ICD-10-CM

## 2023-06-26 DIAGNOSIS — Z7984 Long term (current) use of oral hypoglycemic drugs: Secondary | ICD-10-CM | POA: Diagnosis not present

## 2023-06-26 DIAGNOSIS — F319 Bipolar disorder, unspecified: Secondary | ICD-10-CM | POA: Diagnosis present

## 2023-06-26 DIAGNOSIS — T380X5A Adverse effect of glucocorticoids and synthetic analogues, initial encounter: Secondary | ICD-10-CM | POA: Diagnosis present

## 2023-06-26 DIAGNOSIS — J9621 Acute and chronic respiratory failure with hypoxia: Secondary | ICD-10-CM | POA: Diagnosis present

## 2023-06-26 DIAGNOSIS — Z87442 Personal history of urinary calculi: Secondary | ICD-10-CM

## 2023-06-26 DIAGNOSIS — Z20822 Contact with and (suspected) exposure to covid-19: Secondary | ICD-10-CM | POA: Diagnosis present

## 2023-06-26 LAB — CBC WITH DIFFERENTIAL/PLATELET
Abs Immature Granulocytes: 0.06 10*3/uL (ref 0.00–0.07)
Basophils Absolute: 0.1 10*3/uL (ref 0.0–0.1)
Basophils Relative: 0 %
Eosinophils Absolute: 0.1 10*3/uL (ref 0.0–0.5)
Eosinophils Relative: 1 %
HCT: 37.3 % (ref 36.0–46.0)
Hemoglobin: 11.4 g/dL — ABNORMAL LOW (ref 12.0–15.0)
Immature Granulocytes: 0 %
Lymphocytes Relative: 12 %
Lymphs Abs: 1.6 10*3/uL (ref 0.7–4.0)
MCH: 30.4 pg (ref 26.0–34.0)
MCHC: 30.6 g/dL (ref 30.0–36.0)
MCV: 99.5 fL (ref 80.0–100.0)
Monocytes Absolute: 2 10*3/uL — ABNORMAL HIGH (ref 0.1–1.0)
Monocytes Relative: 15 %
Neutro Abs: 9.6 10*3/uL — ABNORMAL HIGH (ref 1.7–7.7)
Neutrophils Relative %: 72 %
Platelets: 473 10*3/uL — ABNORMAL HIGH (ref 150–400)
RBC: 3.75 MIL/uL — ABNORMAL LOW (ref 3.87–5.11)
RDW: 16.8 % — ABNORMAL HIGH (ref 11.5–15.5)
WBC: 13.4 10*3/uL — ABNORMAL HIGH (ref 4.0–10.5)
nRBC: 0 % (ref 0.0–0.2)

## 2023-06-26 LAB — BLOOD GAS, ARTERIAL
Acid-Base Excess: 11.7 mmol/L — ABNORMAL HIGH (ref 0.0–2.0)
Acid-Base Excess: 11.2 mmol/L — ABNORMAL HIGH (ref 0.0–2.0)
Bicarbonate: 43.1 mmol/L — ABNORMAL HIGH (ref 20.0–28.0)
Bicarbonate: 41.3 mmol/L — ABNORMAL HIGH (ref 20.0–28.0)
Drawn by: 27016
Drawn by: 27016
O2 Saturation: 99.1 %
O2 Saturation: 92.4 %
Patient temperature: 37
Patient temperature: 37
pCO2 arterial: 96 mm[Hg] (ref 32–48)
pCO2 arterial: 84 mm[Hg] (ref 32–48)
pH, Arterial: 7.26 — ABNORMAL LOW (ref 7.35–7.45)
pH, Arterial: 7.3 — ABNORMAL LOW (ref 7.35–7.45)
pO2, Arterial: 255 mm[Hg] — ABNORMAL HIGH (ref 83–108)
pO2, Arterial: 63 mm[Hg] — ABNORMAL LOW (ref 83–108)

## 2023-06-26 LAB — URINALYSIS, W/ REFLEX TO CULTURE (INFECTION SUSPECTED)
Bacteria, UA: NONE SEEN
Bilirubin Urine: NEGATIVE
Glucose, UA: NEGATIVE mg/dL
Ketones, ur: NEGATIVE mg/dL
Leukocytes,Ua: NEGATIVE
Nitrite: NEGATIVE
Protein, ur: 100 mg/dL — AB
Specific Gravity, Urine: 1.013 (ref 1.005–1.030)
pH: 6 (ref 5.0–8.0)

## 2023-06-26 LAB — RAPID URINE DRUG SCREEN, HOSP PERFORMED
Amphetamines: NOT DETECTED
Barbiturates: NOT DETECTED
Benzodiazepines: NOT DETECTED
Cocaine: NOT DETECTED
Opiates: NOT DETECTED
Tetrahydrocannabinol: NOT DETECTED

## 2023-06-26 LAB — COMPREHENSIVE METABOLIC PANEL
ALT: 13 U/L (ref 0–44)
AST: 17 U/L (ref 15–41)
Albumin: 2.9 g/dL — ABNORMAL LOW (ref 3.5–5.0)
Alkaline Phosphatase: 121 U/L (ref 38–126)
Anion gap: 11 (ref 5–15)
BUN: 10 mg/dL (ref 8–23)
CO2: 38 mmol/L — ABNORMAL HIGH (ref 22–32)
Calcium: 8.9 mg/dL (ref 8.9–10.3)
Chloride: 87 mmol/L — ABNORMAL LOW (ref 98–111)
Creatinine, Ser: 0.99 mg/dL (ref 0.44–1.00)
GFR, Estimated: >60 mL/min (ref >60)
Glucose, Bld: 152 mg/dL — ABNORMAL HIGH (ref 70–99)
Potassium: 2.4 mmol/L — CL (ref 3.5–5.1)
Sodium: 136 mmol/L (ref 135–145)
Total Bilirubin: 0.4 mg/dL (ref <1.2)
Total Protein: 7 g/dL (ref 6.5–8.1)

## 2023-06-26 LAB — RESP PANEL BY RT-PCR (RSV, FLU A&B, COVID)  RVPGX2
Influenza A by PCR: NEGATIVE
Influenza B by PCR: NEGATIVE
Resp Syncytial Virus by PCR: NEGATIVE
SARS Coronavirus 2 by RT PCR: NEGATIVE

## 2023-06-26 LAB — CBG MONITORING, ED: Glucose-Capillary: 159 mg/dL — ABNORMAL HIGH (ref 70–99)

## 2023-06-26 LAB — PROCALCITONIN: Procalcitonin: 92.55 ng/mL

## 2023-06-26 LAB — BRAIN NATRIURETIC PEPTIDE: B Natriuretic Peptide: 133 pg/mL — ABNORMAL HIGH (ref 0.0–100.0)

## 2023-06-26 MED ORDER — ALBUTEROL SULFATE (2.5 MG/3ML) 0.083% IN NEBU
INHALATION_SOLUTION | RESPIRATORY_TRACT | Status: AC
  Administered 2023-06-26: 10 mg via RESPIRATORY_TRACT
  Filled 2023-06-26: qty 12

## 2023-06-26 MED ORDER — POTASSIUM CHLORIDE 10 MEQ/100ML IV SOLN
10.0000 meq | INTRAVENOUS | Status: AC
  Administered 2023-06-26 (×4): 10 meq via INTRAVENOUS
  Filled 2023-06-26 (×4): qty 100

## 2023-06-26 MED ORDER — LEVALBUTEROL HCL 0.63 MG/3ML IN NEBU: 0.6300 mg | INHALATION_SOLUTION | Freq: Once | RESPIRATORY_TRACT | Status: AC

## 2023-06-26 MED ORDER — LEVALBUTEROL HCL 0.63 MG/3ML IN NEBU
INHALATION_SOLUTION | RESPIRATORY_TRACT | Status: AC
  Administered 2023-06-26: 0.63 mg via RESPIRATORY_TRACT
  Filled 2023-06-26: qty 3

## 2023-06-26 MED ORDER — BUDESONIDE 0.5 MG/2ML IN SUSP
0.5000 mg | Freq: Two times a day (BID) | RESPIRATORY_TRACT | Status: DC
  Administered 2023-06-26 – 2023-06-29 (×6): 0.5 mg via RESPIRATORY_TRACT
  Filled 2023-06-26 (×7): qty 2

## 2023-06-26 MED ORDER — METHYLPREDNISOLONE SODIUM SUCC 125 MG IJ SOLR
60.0000 mg | Freq: Two times a day (BID) | INTRAMUSCULAR | Status: DC
  Administered 2023-06-26 – 2023-06-27 (×2): 60 mg via INTRAVENOUS
  Filled 2023-06-26 (×2): qty 2

## 2023-06-26 MED ORDER — PANTOPRAZOLE SODIUM 40 MG IV SOLR
40.0000 mg | Freq: Two times a day (BID) | INTRAVENOUS | Status: DC
  Administered 2023-06-26 – 2023-06-29 (×6): 40 mg via INTRAVENOUS
  Filled 2023-06-26 (×6): qty 10

## 2023-06-26 MED ORDER — ROSUVASTATIN CALCIUM 10 MG PO TABS
10.0000 mg | ORAL_TABLET | Freq: Every evening | ORAL | Status: DC
  Administered 2023-06-27 – 2023-06-28 (×2): 10 mg via ORAL
  Filled 2023-06-26 (×2): qty 1

## 2023-06-26 MED ORDER — ACETAMINOPHEN 325 MG PO TABS: 650.0000 mg | ORAL_TABLET | Freq: Four times a day (QID) | ORAL | Status: DC | PRN

## 2023-06-26 MED ORDER — ONDANSETRON HCL 4 MG/2ML IJ SOLN: 4.0000 mg | Freq: Four times a day (QID) | INTRAMUSCULAR | Status: DC | PRN

## 2023-06-26 MED ORDER — DULOXETINE HCL 20 MG PO CPEP
40.0000 mg | ORAL_CAPSULE | Freq: Every day | ORAL | Status: DC
  Administered 2023-06-27 – 2023-06-29 (×3): 40 mg via ORAL
  Filled 2023-06-26 (×6): qty 2

## 2023-06-26 MED ORDER — ARFORMOTEROL TARTRATE 15 MCG/2ML IN NEBU
15.0000 ug | INHALATION_SOLUTION | Freq: Two times a day (BID) | RESPIRATORY_TRACT | Status: DC
  Administered 2023-06-26 – 2023-06-29 (×6): 15 ug via RESPIRATORY_TRACT
  Filled 2023-06-26 (×6): qty 2

## 2023-06-26 MED ORDER — ENOXAPARIN SODIUM 40 MG/0.4ML IJ SOSY
40.0000 mg | PREFILLED_SYRINGE | INTRAMUSCULAR | Status: DC
  Administered 2023-06-26 – 2023-06-28 (×3): 40 mg via SUBCUTANEOUS
  Filled 2023-06-26 (×3): qty 0.4

## 2023-06-26 MED ORDER — NICOTINE 21 MG/24HR TD PT24
21.0000 mg | MEDICATED_PATCH | Freq: Every day | TRANSDERMAL | Status: DC
  Administered 2023-06-26 – 2023-06-29 (×4): 21 mg via TRANSDERMAL
  Filled 2023-06-26 (×4): qty 1

## 2023-06-26 MED ORDER — SODIUM CHLORIDE 0.9 % IV SOLN
500.0000 mg | Freq: Once | INTRAVENOUS | Status: AC
  Administered 2023-06-26: 500 mg via INTRAVENOUS
  Filled 2023-06-26: qty 5

## 2023-06-26 MED ORDER — OLANZAPINE 5 MG PO TABS
5.0000 mg | ORAL_TABLET | Freq: Every day | ORAL | Status: DC
  Administered 2023-06-27 – 2023-06-29 (×3): 5 mg via ORAL
  Filled 2023-06-26 (×3): qty 1

## 2023-06-26 MED ORDER — DOXYCYCLINE HYCLATE 100 MG IV SOLR
100.0000 mg | Freq: Two times a day (BID) | INTRAVENOUS | Status: DC
  Administered 2023-06-26 – 2023-06-29 (×6): 100 mg via INTRAVENOUS
  Filled 2023-06-26 (×11): qty 100

## 2023-06-26 MED ORDER — ROPINIROLE HCL 1 MG PO TABS
3.0000 mg | ORAL_TABLET | Freq: Every day | ORAL | Status: DC
  Administered 2023-06-27: 3 mg via ORAL
  Filled 2023-06-26: qty 3

## 2023-06-26 MED ORDER — METHYLPREDNISOLONE SODIUM SUCC 125 MG IJ SOLR
125.0000 mg | Freq: Once | INTRAMUSCULAR | Status: AC
  Administered 2023-06-26: 125 mg via INTRAVENOUS
  Filled 2023-06-26: qty 2

## 2023-06-26 MED ORDER — NALOXONE HCL 2 MG/2ML IJ SOSY: 1.0000 mg | PREFILLED_SYRINGE | Freq: Once | INTRAMUSCULAR | Status: AC

## 2023-06-26 MED ORDER — POTASSIUM CHLORIDE IN NACL 20-0.9 MEQ/L-% IV SOLN
Freq: Once | INTRAVENOUS | Status: AC
  Filled 2023-06-26: qty 1000

## 2023-06-26 MED ORDER — CHLORHEXIDINE GLUCONATE CLOTH 2 % EX PADS
6.0000 | MEDICATED_PAD | Freq: Every day | CUTANEOUS | Status: DC
  Administered 2023-06-27 – 2023-06-29 (×3): 6 via TOPICAL

## 2023-06-26 MED ORDER — FUROSEMIDE 10 MG/ML IJ SOLN
40.0000 mg | Freq: Once | INTRAMUSCULAR | Status: AC
  Administered 2023-06-26: 40 mg via INTRAVENOUS
  Filled 2023-06-26: qty 4

## 2023-06-26 MED ORDER — NALOXONE HCL 2 MG/2ML IJ SOSY
PREFILLED_SYRINGE | INTRAMUSCULAR | Status: AC
  Administered 2023-06-26: 1 mg via INTRAVENOUS
  Filled 2023-06-26: qty 2

## 2023-06-26 MED ORDER — POTASSIUM CHLORIDE CRYS ER 20 MEQ PO TBCR
40.0000 meq | EXTENDED_RELEASE_TABLET | Freq: Once | ORAL | Status: AC
  Administered 2023-06-26: 40 meq via ORAL
  Filled 2023-06-26: qty 2

## 2023-06-26 MED ORDER — ONDANSETRON HCL 4 MG PO TABS: 4.0000 mg | ORAL_TABLET | Freq: Four times a day (QID) | ORAL | Status: DC | PRN

## 2023-06-26 MED ORDER — ACETAMINOPHEN 650 MG RE SUPP: 650.0000 mg | Freq: Four times a day (QID) | RECTAL | Status: DC | PRN

## 2023-06-26 MED ORDER — IPRATROPIUM-ALBUTEROL 0.5-2.5 (3) MG/3ML IN SOLN
3.0000 mL | Freq: Four times a day (QID) | RESPIRATORY_TRACT | Status: DC
  Administered 2023-06-26 – 2023-06-29 (×12): 3 mL via RESPIRATORY_TRACT
  Filled 2023-06-26 (×13): qty 3

## 2023-06-26 MED ORDER — ENSURE ENLIVE PO LIQD
237.0000 mL | Freq: Two times a day (BID) | ORAL | Status: DC
  Administered 2023-06-27 – 2023-06-29 (×2): 237 mL via ORAL

## 2023-06-26 MED ORDER — ALBUTEROL SULFATE (2.5 MG/3ML) 0.083% IN NEBU: 10.0000 mg | INHALATION_SOLUTION | Freq: Once | RESPIRATORY_TRACT | Status: AC

## 2023-06-26 NOTE — Progress Notes (Signed)
Patient transported from ED to [facility] without complication. She talked the entire trip. She remains on 4lpm [place] and her Bipap is on standby in the room.

## 2023-06-26 NOTE — ED Notes (Signed)
Pco2 96Date and time results received: 06/26/23 8:51 AM  (use smartphrase ".now" to insert current time)  Test: Arterial Pco2 96 Critical Value:   Name of Provider Notified: Dr. Jeraldine Loots  Orders Received? Or Actions Taken?:

## 2023-06-26 NOTE — H&P (Signed)
History and Physical    Patient: [name] NWG:[id number] DOB: December 18, 1955 DOA: 06/26/2023 DOS: the patient was seen and examined on 06/26/2023 PCP: Kara Pacer, NP  Patient coming from: Home  Chief Complaint:  Chief Complaint  Patient presents with   Altered Mental Status   HPI: [name] is a 67 y/o female with a history of non-small cell lung cancer status post empiric XRT, tobacco abuse, chronic respiratory failure on 4 L, previous history of cocaine abuse, bipolar disorder, diabetes mellitus type 2, hypertension, hyperlipidemia, d CHF presenting with 1 week history of shortness of breath.  The patient presented in respiratory distress and was placed on BiPAP.  History is obtained from review of the medical record and speaking with the patient's spouse.  The patient was recently mated to the hospital after a stay from 05/26/2023 to 05/25/2023.  During that hospital stay, the patient was admitted for acute on chronic respiratory failure with secondary to COPD exacerbation.  The patient required CPAP at that time.  She was treated with ceftriaxone and azithromycin.  She was treated with with bronchodilators and intravenous steroids with improvement.  She was discharged home on her usual 4 L. Unfortunately, the patient continues to smoke about 1 pack/day.  In the past week, spouse has noted that she has had more dyspnea on exertion.  They have increased her home oxygen from 4 L to 5 L without much improvement.  In the morning of 06/26/2023, the patient spouse went to check up on her, and the patient was difficult arouse.  As result, EMS was activated.  Upon arrival, EMS noted the patient have oxygen saturation of 70% and difficult to arouse.  She will only respond to sternal rub.  The patient was placed on BiPAP. Spouse states that the patient had been in her usual state of health except her for her shortness of breath.  The patient has not had any complaints  of fevers, chills, chest pain, nausea, vomiting, diarrhea.  The patient has a chronic cough.  She has not had any hemoptysis.  The patient has been using her bronchodilators.  In fact spot states that the patient has been overusing them without much relief of her shortness of breath.  In the ED, the patient was initially obtunded.  She was afebrile with soft blood pressures with SBP in the 90s.  She was placed on BiPAP. Initial ABG showed 7.2 6/96/225/43.  After about 2 hours on BiPAP, repeat ABG showed 7 point 3/80/63/41.  The patient's mental status gradually improved although she remains somewhat confused.  WBC 13.4, hemoglobin 11.4, platelet 473.  Sodium 136, potassium 2.4, bicarbonate 30, serum creatinine 0.99.  LFTs were unremarkable.  COVID-19 PCR is negative.  BNP was 133.  EKG showed sinus tachycardia with nonspecific ST changes.  Chest x-ray showed chronic interstitial markings.  The patient was admitted for further evaluation of her respiratory failure. In the ED, the patient was started on bronchodilators, IV Solu-Medrol and given a dose of 40 mg IV Lasix.  Review of Systems: As mentioned in the history of present illness. All other systems reviewed and are negative. Past Medical History:  Diagnosis Date   Anemia    Anxiety    Arthritis    Bipolar 1 disorder (HCC)    CHF (congestive heart failure) (HCC)    COPD (chronic obstructive pulmonary disease) (HCC)    Depression    Diabetes mellitus without complication (HCC)    Dyspnea    Emphysema  of lung (HCC)    GERD (gastroesophageal reflux disease)    Headache    migraines   History of hiatal hernia    History of kidney stones    Hyperlipidemia    Hypertension    Neuromuscular disorder (HCC)    neuropathy   On home oxygen therapy    Pneumonia 2015, 2019   Tracheomalacia    Vaginal Pap smear, abnormal    Past Surgical History:  Procedure Laterality Date   CATARACT EXTRACTION W/PHACO Left 01/14/2020   Procedure: CATARACT  EXTRACTION PHACO AND INTRAOCULAR LENS PLACEMENT LEFT EYE;  Surgeon: Fabio Pierce, MD;  Location: [facility];  Service: Ophthalmology;  Laterality: Left;  CDE: 8.18   CATARACT EXTRACTION W/PHACO Right 02/01/2020   Procedure: CATARACT EXTRACTION PHACO AND INTRAOCULAR LENS PLACEMENT RIGHT EYE;  Surgeon: Fabio Pierce, MD;  Location: [facility];  Service: Ophthalmology;  Laterality: Right;  CDE: 9.94   CHEILECTOMY Right 01/28/2020   Procedure: KELLER BUNION IMPLANT RIGHT FOOT CHEILECTOMY RIGHT ROOT;  Surgeon: Asencion Islam, DPM;  Location: [facility];  Service: Podiatry;  Laterality: Right;  BLOCK   CHOLECYSTECTOMY     COLONOSCOPY  July 2010   Dr. Lovell Sheehan: 3 rectal polyps, not enough tissue for pathologic examination, recommended surveillance in 3 years   COLONOSCOPY N/A 10/23/2014   RMR: Multiple colonic polyps removed as described above. No endoscopic explaniation for abdominal pain. however. next tcs 10/2019   ESOPHAGOGASTRODUODENOSCOPY  July 2010   Dr. Lovell Sheehan: gastritis and duodenitis, H.pylori negative   ESOPHAGOGASTRODUODENOSCOPY N/A 10/23/2014   RMR: Normal EGD. Status post passage of a Maloney dilator. Today's finding s would not explain abdominal pain   EYE SURGERY Left 01/14/2020   cataract removal   GANGLION CYST EXCISION Left 09/07/2018   Procedure: REMOVAL GANGLION OF WRIST;  Surgeon: Vickki Hearing, MD;  Location: [facility];  Service: Orthopedics;  Laterality: Left;   HERNIA REPAIR     Dr. Lovell Sheehan   KIDNEY STONE SURGERY     MALONEY DILATION N/A 10/23/2014   Procedure: Elease Hashimoto DILATION;  Surgeon: Corbin Ade, MD;  Location: [facility];  Service: Endoscopy;  Laterality: N/A;   OPEN REDUCTION INTERNAL FIXATION (ORIF) DISTAL RADIAL FRACTURE Right 12/09/2015   Procedure: OPEN REDUCTION INTERNAL FIXATION (ORIF) RIGHT DISTAL RADIUS;  Surgeon: Betha Loa, MD;  Location: [facility];  Service: Orthopedics;  Laterality: Right;   Social History:  reports that she has been smoking  cigarettes. She started smoking about 56 years ago. She has a 111.9 pack-year smoking history. She has been exposed to tobacco smoke. She has never used smokeless tobacco. She reports that she does not currently use alcohol after a past usage of about 1.0 standard drink of alcohol per week. She reports that she does not currently use drugs after having used the following drugs: Cocaine.  Allergies  Allergen Reactions   Septra [Sulfamethoxazole-Trimethoprim] Other (See Comments)    Patient is unsure if she allergic to septra or penicillin. Patient states one or another caused "rib pain with a little breathing problem".   Penicillins Other (See Comments)    Patient is unsure if she allergic to penicillin or septra. Patient states one or another caused "rib pain with a little breathing problem". Tolerates cephalosporins    Family History  Adopted: Yes    Prior to Admission medications   Medication Sig Start Date End Date Taking? Authorizing Provider  acetaminophen (TYLENOL) 500 MG tablet Take 1,000 mg by mouth every 6 (six) hours as  needed for moderate pain, fever or headache.    [provider]  albuterol (PROVENTIL) (2.5 MG/3ML) 0.083% nebulizer solution INHALE 1 VIAL VIA NEBULIZER EVERY 4 HOURS Patient taking differently: Take 2.5 mg by nebulization every 4 (four) hours. 02/14/23   Oretha Milch, MD  albuterol (VENTOLIN HFA) 108 (90 Base) MCG/ACT inhaler Inhale 1-2 puffs into the lungs every 6 (six) hours as needed for wheezing or shortness of breath. 12/29/22   [provider]  amLODipine (NORVASC) 10 MG tablet Take 1 tablet (10 mg total) by mouth daily. 04/20/23   Shahmehdi, Gemma Payor, MD  ammonium lactate (AMLACTIN) 12 % cream Apply 1 Application topically as needed for dry skin. 05/02/23   McCaughan, Dia D, DPM  Budeson-Glycopyrrol-Formoterol (BREZTRI AEROSPHERE) 160-9-4.8 MCG/ACT AERO Inhale 2 puffs into the lungs in the morning and at bedtime. 04/05/23   Nyoka Cowden, MD   diclofenac Sodium (VOLTAREN) 1 % GEL Apply 1 Application topically 4 (four) times daily as needed (leg pain). 12/29/22   [provider]  DULoxetine (CYMBALTA) 20 MG capsule Take 40 mg by mouth daily. 12/20/22   [provider]  famotidine (PEPCID) 20 MG tablet One after supper 04/19/23   Shahmehdi, Guadalupe Maple A, MD  feeding supplement (ENSURE ENLIVE / ENSURE PLUS) LIQD Take 237 mLs by mouth 2 (two) times daily between meals. 06/02/23   Marguerita Merles Latif, DO  fluorometholone (FML) 0.1 % ophthalmic suspension 1 drop See admin instructions. 01/09/23   [provider]  Ipratropium-Albuterol (COMBIVENT RESPIMAT) 20-100 MCG/ACT AERS respimat Inhale 1 puff into the lungs every 6 (six) hours as needed for wheezing. 05/13/23   Oretha Milch, MD  ipratropium-albuterol (DUONEB) 0.5-2.5 (3) MG/3ML SOLN Take 3 mLs by nebulization every 4 (four) hours as needed. 05/10/23   [provider]  metFORMIN (GLUCOPHAGE) 1000 MG tablet Take 1 tablet (1,000 mg total) by mouth 2 (two) times daily. 05/25/22   Dani Gobble, NP  metoprolol tartrate (LOPRESSOR) 25 MG tablet Take 0.5 tablets (12.5 mg total) by mouth 2 (two) times daily. 06/02/23 07/02/23  Marguerita Merles Latif, DO  nicotine (NICODERM CQ - DOSED IN MG/24 HOURS) 21 mg/24hr patch Place 1 patch (21 mg total) onto the skin daily. 06/03/23   Marguerita Merles Latif, DO  nitroGLYCERIN (NITROSTAT) 0.4 MG SL tablet Place 0.4 mg under the tongue every 5 (five) minutes as needed for chest pain. 09/08/21   [provider]  nystatin (MYCOSTATIN) 100000 UNIT/ML suspension TAKE ONE TEASPOONFUL ( ) BY MOUTH EVERY MORNING AND AT BEDTIME 06/16/23   Oretha Milch, MD  OLANZapine (ZYPREXA) 5 MG tablet Take 5 mg by mouth daily. 05/11/23   [provider]  omeprazole (PRILOSEC) 20 MG capsule Take 1 capsule (20 mg total) by mouth daily. 06/14/23   Ahmed, Juanetta Beets, MD  ondansetron (ZOFRAN) 4 MG tablet Take 4 mg by mouth every 8 (eight)  hours as needed for nausea or vomiting.    [provider]  RESTASIS 0.05 % ophthalmic emulsion Place 1 drop into both eyes 2 (two) times daily.    [provider]  rOPINIRole (REQUIP) 2 MG tablet Take 3 mg by mouth at bedtime. 12/09/22   [provider]  rosuvastatin (CRESTOR) 10 MG tablet Take 10 mg by mouth every evening. 11/10/22   [provider]  sodium chloride (OCEAN) 0.65 % SOLN nasal spray Place 1 spray into both nostrils as needed for congestion. 06/02/23   Marguerita Merles Latif, DO  SUMAtriptan (IMITREX) 25  MG tablet Take 25 mg by mouth every 2 (two) hours as needed for migraine. 12/23/20   [provider]  torsemide (DEMADEX) 20 MG tablet Take 1 tablet (20 mg total) by mouth daily as needed. May take an additional 20 mg daily as needed for swelling. 03/30/23   Ellsworth Lennox, [place]    Physical Exam: Vitals:   06/26/23 1110 06/26/23 1159 06/26/23 1200 06/26/23 1250  BP: (!) 87/64  (!) 90/50 (!) 92/56  Pulse: 99  99 (!) 110  Resp: 16  20 (!) 25  Temp:  98.2 F (36.8 C) 98.2 F (36.8 C)   TempSrc:  Axillary Axillary   SpO2: (!) 88%  97% (!) 85%   GENERAL:  A&O x 2, NAD, well developed, cooperative, follows commands HEENT: [place]/AT, No thrush, No icterus, No oral ulcers Neck:  No neck mass, No meningismus, soft, supple CV: RRR, no S3, no S4, no rub, no JVD Lungs:  bilateral rales.  Bilateral exp wheeze Abd: soft/NT +BS, nondistended Ext: trace LE nonpitting edema, no lymphangitis, no cyanosis, no rashes Neuro:  CN II-XII intact, strength 4/5 in RUE, RLE, strength 4/5 LUE, LLE; sensation intact bilateral; no dysmetria; babinski equivocal  Data Reviewed: Data reviewed above in history  Assessment and Plan: Acute on chronic respiratory failure with hypoxia and hypercarbia -Secondary to COPD exacerbation -Currently on BiPAP -Chronically on 4 L nasal cannula at home -Aim for oxygen saturation 88-92% -Wean off BiPAP as tolerated -ABGs  as discussed above  COPD exacerbation -Start DuoNebs -start Pulmicort -Start Brovana -Continue IV Solu-Medrol -COVID 19/RSV/Flu--neg -viral respiratory panel  Acute metabolic encephalopathy -secondery to hypoxia and hypercarbia and respiratory failure -UA/urine culture  Essential hypertension -Holding amlodipine secondary to soft blood pressure  Chronic diastolic CHF (chronic HFimpEF) -The patient is clinically euvolemic -09/25/2022 echo EF 65 to 70%, no WMA, normal RVF, trivial MR -01/2022 echo EF 40-45% prescription -hold torsemide due to soft BPs  Tobacco abuse -The patient continues to smoke 1 pack/day -Tobacco cessation discussed  Diabetes mellitus type 2, controlled with hyperglycemia -Hemoglobin A1c -Sliding scale -Anticipated elevated CBGs secondary to steroids -Hold metformin  Mixed hyperlipidemia -Continue statin  Non-small cell lung cancer -previously underwent empiric radiation therapy to her left lung as felt to be too high risk for biopsy given her severe COPD. CT Scan in 01/2023 did show interval decrease in size of the left apical lung lesion.   Hypokalemia -replete -check mag  Polysubstance abuse to include cocaine and tobacco -Educated on absolute need for cessation -Currently on Nicotine 21 mg Transdermal Patch q. 24  Bipolar Disorder/Severe Depression -Continue usual home medications with Olanzapine 5 mg po Daily    Advance Care Planning: FULL  Consults: none  Family Communication: spouse at bedside 06/26/23  Severity of Illness: The appropriate patient status for this patient is INPATIENT. Inpatient status is judged to be reasonable and necessary in order to provide the required intensity of service to ensure the patient's safety. The patient's presenting symptoms, physical exam findings, and initial radiographic and laboratory data in the context of their chronic comorbidities is felt to place them at high risk for further clinical  deterioration. Furthermore, it is not anticipated that the patient will be medically stable for discharge from the hospital within 2 midnights of admission.   * I certify that at the point of admission it is my clinical judgment that the patient will require inpatient hospital care spanning beyond 2 midnights from the point of admission due to high  intensity of service, high risk for further deterioration and high frequency of surveillance required.*  Author: Catarina Hartshorn, MD 06/26/2023 2:48 PM  For on call review [email or web address].

## 2023-06-26 NOTE — Hospital Course (Addendum)
67 y/o female with a history of non-small cell lung cancer status post empiric XRT, tobacco abuse, chronic respiratory failure on 4 L, previous history of cocaine abuse, bipolar disorder, diabetes mellitus type 2, hypertension, hyperlipidemia, d CHF presenting with 1 week history of shortness of breath.  The patient presented in respiratory distress and was placed on BiPAP.  History is obtained from review of the medical record and speaking with the patient's spouse.  The patient was recently mated to the hospital after a stay from 05/26/2023 to 05/25/2023.  During that hospital stay, the patient was admitted for acute on chronic respiratory failure with secondary to COPD exacerbation.  The patient required CPAP at that time.  She was treated with ceftriaxone and azithromycin.  She was treated with with bronchodilators and intravenous steroids with improvement.  She was discharged home on her usual 4 L. Unfortunately, the patient continues to smoke about 1 pack/day.  In the past week, spouse has noted that she has had more dyspnea on exertion.  They have increased her home oxygen from 4 L to 5 L without much improvement.  In the morning of 06/26/2023, the patient spouse went to check up on her, and the patient was difficult arouse.  As result, EMS was activated.  Upon arrival, EMS noted the patient have oxygen saturation of 70% and difficult to arouse.  She will only respond to sternal rub.  The patient was placed on BiPAP. Spouse states that the patient had been in her usual state of health except her for her shortness of breath.  The patient has not had any complaints of fevers, chills, chest pain, nausea, vomiting, diarrhea.  The patient has a chronic cough.  She has not had any hemoptysis.  The patient has been using her bronchodilators.  In fact spot states that the patient has been overusing them without much relief of her shortness of breath.  In the ED, the patient was initially obtunded.  She was  afebrile with soft blood pressures with SBP in the 90s.  She was placed on BiPAP. Initial ABG showed 7.2 6/96/225/43.  After about 2 hours on BiPAP, repeat ABG showed 7 point 3/80/63/41.  The patient's mental status gradually improved although she remains somewhat confused.  WBC 13.4, hemoglobin 11.4, platelet 473.  Sodium 136, potassium 2.4, bicarbonate 30, serum creatinine 0.99.  LFTs were unremarkable.  COVID-19 PCR is negative.  BNP was 133.  EKG showed sinus tachycardia with nonspecific ST changes.  Chest x-ray showed chronic interstitial markings.  The patient was admitted for further evaluation of her respiratory failure. In the ED, the patient was started on bronchodilators, IV Solu-Medrol and given a dose of 40 mg IV Lasix.

## 2023-06-26 NOTE — Progress Notes (Signed)
Patient is still awake holding off on BIPAP for now.

## 2023-06-26 NOTE — ED Triage Notes (Signed)
Pt bib rcems from home. Husband states pt was fine at 9am last night, checked on her this am and was unresponsive and having difficulty breathing. Per ems pt was 65%ra. Hx of current lung cancer and copd.  Pt arrived on non rebreather and only responded to deep sternal rub. Color slightly gray. Cap refill over 3 seconds. Edp at bedside upon arrival.

## 2023-06-26 NOTE — ED Notes (Signed)
Date and time results received: 06/26/23 1046 (use smartphrase ".now" to insert current time)  Test: pco2 Critical Value: 69  Name of Provider Notified: lockwood  Orders Received? Or Actions Taken?: Orders Received - See Orders for details

## 2023-06-26 NOTE — Progress Notes (Signed)
Patient removed self from Bipap. Patient was placed on 6lpm to get her SPO2 to improve. Once I got in the room, I placed a pulse ox probe on her ear and her SPO2 reading was 97%. I turned her O2 down to 4lpm which is her home setting. She was wheezing a lot. I spoke with physician to get a verbal order for Xopenex nebulizer. He agreed. Patient received neb and was still wheezing.

## 2023-06-26 NOTE — ED Provider Notes (Signed)
[place] EMERGENCY DEPARTMENT AT [facility] Provider Note   CSN: [id number] Arrival date & time: 06/26/23  8295     History  Chief Complaint  Patient presents with   Altered Mental Status    [name] is a 67 y.o. female.  HPI Patient presents in respiratory distress.  Patient arrives via EMS.  Level 5 caveat secondary to acuity of condition. Per EMS patient was last seen normal about 10 hours ago by her husband.  She is known to have COPD, advanced.  Today she was found listless.  EMS reports patient was listless, responded to sternal rub, received supplemental oxygen after being found to be hypoxic in the 70% range on room air.  No family report of trauma, fall that they are aware of.     Home Medications Prior to Admission medications   Medication Sig Start Date End Date Taking? Authorizing Provider  acetaminophen (TYLENOL) 500 MG tablet Take 1,000 mg by mouth every 6 (six) hours as needed for moderate pain, fever or headache.    [provider]  albuterol (PROVENTIL) (2.5 MG/3ML) 0.083% nebulizer solution INHALE 1 VIAL VIA NEBULIZER EVERY 4 HOURS Patient taking differently: Take 2.5 mg by nebulization every 4 (four) hours. 02/14/23   Oretha Milch, MD  albuterol (VENTOLIN HFA) 108 (90 Base) MCG/ACT inhaler Inhale 1-2 puffs into the lungs every 6 (six) hours as needed for wheezing or shortness of breath. 12/29/22   [provider]  amLODipine (NORVASC) 10 MG tablet Take 1 tablet (10 mg total) by mouth daily. 04/20/23   Shahmehdi, Gemma Payor, MD  ammonium lactate (AMLACTIN) 12 % cream Apply 1 Application topically as needed for dry skin. 05/02/23   McCaughan, Dia D, DPM  Budeson-Glycopyrrol-Formoterol (BREZTRI AEROSPHERE) 160-9-4.8 MCG/ACT AERO Inhale 2 puffs into the lungs in the morning and at bedtime. 04/05/23   Nyoka Cowden, MD  diclofenac Sodium (VOLTAREN) 1 % GEL Apply 1 Application topically 4 (four) times daily as needed (leg pain).  12/29/22   [provider]  DULoxetine (CYMBALTA) 20 MG capsule Take 40 mg by mouth daily. 12/20/22   [provider]  famotidine (PEPCID) 20 MG tablet One after supper 04/19/23   Shahmehdi, Guadalupe Maple A, MD  feeding supplement (ENSURE ENLIVE / ENSURE PLUS) LIQD Take 237 mLs by mouth 2 (two) times daily between meals. 06/02/23   Marguerita Merles Latif, DO  fluorometholone (FML) 0.1 % ophthalmic suspension 1 drop See admin instructions. 01/09/23   [provider]  Ipratropium-Albuterol (COMBIVENT RESPIMAT) 20-100 MCG/ACT AERS respimat Inhale 1 puff into the lungs every 6 (six) hours as needed for wheezing. 05/13/23   Oretha Milch, MD  ipratropium-albuterol (DUONEB) 0.5-2.5 (3) MG/3ML SOLN Take 3 mLs by nebulization every 4 (four) hours as needed. 05/10/23   [provider]  metFORMIN (GLUCOPHAGE) 1000 MG tablet Take 1 tablet (1,000 mg total) by mouth 2 (two) times daily. 05/25/22   Dani Gobble, NP  metoprolol tartrate (LOPRESSOR) 25 MG tablet Take 0.5 tablets (12.5 mg total) by mouth 2 (two) times daily. 06/02/23 07/02/23  Marguerita Merles Latif, DO  nicotine (NICODERM CQ - DOSED IN MG/24 HOURS) 21 mg/24hr patch Place 1 patch (21 mg total) onto the skin daily. 06/03/23   Marguerita Merles Latif, DO  nitroGLYCERIN (NITROSTAT) 0.4 MG SL tablet Place 0.4 mg under the tongue every 5 (five) minutes as needed for chest pain. 09/08/21   [provider]  nystatin (MYCOSTATIN) 100000 UNIT/ML suspension TAKE ONE TEASPOONFUL (  ) BY MOUTH EVERY MORNING AND AT BEDTIME 06/16/23   Oretha Milch, MD  OLANZapine (ZYPREXA) 5 MG tablet Take 5 mg by mouth daily. 05/11/23   [provider]  omeprazole (PRILOSEC) 20 MG capsule Take 1 capsule (20 mg total) by mouth daily. 06/14/23   Ahmed, Juanetta Beets, MD  ondansetron (ZOFRAN) 4 MG tablet Take 4 mg by mouth every 8 (eight) hours as needed for nausea or vomiting.    [provider]  RESTASIS 0.05 % ophthalmic emulsion Place 1  drop into both eyes 2 (two) times daily.    [provider]  rOPINIRole (REQUIP) 2 MG tablet Take 3 mg by mouth at bedtime. 12/09/22   [provider]  rosuvastatin (CRESTOR) 10 MG tablet Take 10 mg by mouth every evening. 11/10/22   [provider]  sodium chloride (OCEAN) 0.65 % SOLN nasal spray Place 1 spray into both nostrils as needed for congestion. 06/02/23   Marguerita Merles Latif, DO  SUMAtriptan (IMITREX) 25 MG tablet Take 25 mg by mouth every 2 (two) hours as needed for migraine. 12/23/20   [provider]  torsemide (DEMADEX) 20 MG tablet Take 1 tablet (20 mg total) by mouth daily as needed. May take an additional 20 mg daily as needed for swelling. 03/30/23   Strader, [place] M, PA-C      Allergies    Septra [sulfamethoxazole-trimethoprim] and Penicillins    Review of Systems   Review of Systems  Physical Exam Updated Vital Signs BP 110/76   Pulse (!) 116   Temp 97.9 F (36.6 C) (Axillary)   Resp (!) 24   SpO2 100%  Physical Exam Vitals and nursing note reviewed.  Constitutional:      General: She is in acute distress.     Appearance: She is well-developed. She is ill-appearing and diaphoretic.  HENT:     Head: Normocephalic and atraumatic.  Eyes:     Conjunctiva/sclera: Conjunctivae normal.  Cardiovascular:     Rate and Rhythm: Regular rhythm. Tachycardia present.  Pulmonary:     Effort: Respiratory distress present.     Breath sounds: No stridor. Wheezing present.  Abdominal:     General: There is no distension.  Skin:    General: Skin is warm.  Neurological:     Mental Status: She is alert.     Comments: Moves all extremities minimally spontaneously but to painful stimulation responds appropriately, with brief periods of eye-opening, minimal interactivity.  Psychiatric:     Comments: Obtunded, responds to stimuli, occasionally offer some spontaneous words     ED Results / Procedures / Treatments   Labs (all labs ordered are  listed, but only abnormal results are displayed) Labs Reviewed  CBG MONITORING, ED - Abnormal; Notable for the following components:      Result Value   Glucose-Capillary 159 (*)    All other components within normal limits  RESP PANEL BY RT-PCR (RSV, FLU A&B, COVID)  RVPGX2  COMPREHENSIVE METABOLIC PANEL  BRAIN NATRIURETIC PEPTIDE  BLOOD GAS, ARTERIAL  CBC WITH DIFFERENTIAL/PLATELET    EKG None  Radiology No results found.  Procedures Procedures    Medications Ordered in ED Medications  methylPREDNISolone sodium succinate (SOLU-MEDROL) 125 mg/2 mL injection 125 mg (has no administration in time range)  albuterol (PROVENTIL,VENTOLIN) solution continuous neb (has no administration in time range)  naloxone [facility]) injection 1 mg (1 mg Intravenous Given 06/26/23 1884)    ED Course/ Medical Decision Making/ A&P  Medical Decision Making Adult female presents in extremis, with increased work of breathing, listlessness.  She is afebrile, tachycardic, suspicion for COPD exacerbation given admissions in each of the last 2 months for similar presentations.  Patient has no known history of narcotic abuse, but Narcan will be provided, the patient continued to receive resuscitation including BiPAP, monitoring, bronchodilators. Cardiac 120 sinus tach abnormal Pulse ox 100% with supplemental oxygen only abnormal  Amount and/or Complexity of Data Reviewed Independent Historian: EMS External Data Reviewed: notes. Labs: ordered. Decision-making details documented in ED Course. Radiology: ordered and independent interpretation performed. Decision-making details documented in ED Course. ECG/medicine tests: ordered and independent interpretation performed. Decision-making details documented in ED Course.  Risk Prescription drug management. Decision regarding hospitalization. Diagnosis or treatment significantly limited by social determinants of  health.  Update: Comfort, having received Dilaudid for additional pain control after initial fentanyl did not result in substantial changes.  With concern for hypercarbic respiratory failure, history of COPD, multiple recent admissions, consideration of not immediately intubated the patient with consideration of difficulty with extubation when appropriate.  Rather, with RT I attempted to maximize the patient's clearance of CO2, and patient was transitioned to BiPAP after arrival.  Patient had continuous nebs, and labs x-ray sent.  Patient's initial blood gas consistent with hypercarbic respiratory failure.  Subsequent blood gas with improvement, and as numbers improved, the patient's cognition also improved.  Patient's husband arrived, and I discussed with him goals of care, recent admissions, COPD, prognosis at length.  They have not discussed goals of care, nor resuscitation thoughts beyond patient reportedly stating that she wanted a chance should she require it rather than being completed DNI.  Patient has improved, now awake and spontaneously, though not for very long, she is aware of self, time, place.  Update labs with mild hypokalemia, mild BNP elevation, findings consistent with prior admissions, patient has received potassium repletion, diuresis, also per prior resuscitation efforts during COPD exacerbations 3:19 PM Patient more awake than on arrival substantially.  I discussed her case with critical care colleagues, and internal medicine colleagues.  He has patient has improved measurably, and has had similar course multiple times in the past 2 months patient will remain in this facility ICU, for ongoing care, rather than transfer to our affiliated care center.  No current indication for intubation, given the patient's improvement.   CRITICAL CARE Performed by: Gerhard Munch Total critical care time: 45 minutes Critical care time was exclusive of separately billable procedures and  treating other patients. Critical care was necessary to treat or prevent imminent or life-threatening deterioration. Critical care was time spent personally by me on the following activities: development of treatment plan with patient and/or surrogate as well as nursing, discussions with consultants, evaluation of patient's response to treatment, examination of patient, obtaining history from patient or surrogate, ordering and performing treatments and interventions, ordering and review of laboratory studies, ordering and review of radiographic studies, pulse oximetry and re-evaluation of patient's condition.   Final Clinical Impression(s) / ED Diagnoses Final diagnoses:  Acute on chronic respiratory failure with hypoxia and hypercapnia (HCC)    Rx / DC Orders ED Discharge Orders     None         Gerhard Munch, MD 06/26/23 1520

## 2023-06-27 DIAGNOSIS — J9622 Acute and chronic respiratory failure with hypercapnia: Secondary | ICD-10-CM | POA: Diagnosis not present

## 2023-06-27 DIAGNOSIS — R451 Restlessness and agitation: Secondary | ICD-10-CM

## 2023-06-27 DIAGNOSIS — J9621 Acute and chronic respiratory failure with hypoxia: Secondary | ICD-10-CM | POA: Diagnosis not present

## 2023-06-27 LAB — RESPIRATORY PANEL BY PCR

## 2023-06-27 LAB — BLOOD GAS, VENOUS
Acid-Base Excess: 10 mmol/L — ABNORMAL HIGH (ref 0.0–2.0)
Bicarbonate: 39.9 mmol/L — ABNORMAL HIGH (ref 20.0–28.0)
Drawn by: 66297
O2 Saturation: 95.6 %
Patient temperature: 36.7
pCO2, Ven: 80 mm[Hg] (ref 44–60)
pH, Ven: 7.3 (ref 7.25–7.43)
pO2, Ven: 71 mm[Hg] — ABNORMAL HIGH (ref 32–45)

## 2023-06-27 LAB — BLOOD GAS, ARTERIAL
Acid-Base Excess: 12 mmol/L — ABNORMAL HIGH (ref 0.0–2.0)
Bicarbonate: 40.7 mmol/L — ABNORMAL HIGH (ref 20.0–28.0)
Drawn by: 10555
O2 Saturation: 90.7 %
Patient temperature: 37
pCO2 arterial: 72 mm[Hg] (ref 32–48)
pH, Arterial: 7.36 (ref 7.35–7.45)
pO2, Arterial: 57 mm[Hg] — ABNORMAL LOW (ref 83–108)

## 2023-06-27 LAB — CBC
HCT: 27.6 % — ABNORMAL LOW (ref 36.0–46.0)
Hemoglobin: 8.2 g/dL — ABNORMAL LOW (ref 12.0–15.0)
MCH: 29.8 pg (ref 26.0–34.0)
MCHC: 29.7 g/dL — ABNORMAL LOW (ref 30.0–36.0)
MCV: 100.4 fL — ABNORMAL HIGH (ref 80.0–100.0)
Platelets: 386 10*3/uL (ref 150–400)
RBC: 2.75 MIL/uL — ABNORMAL LOW (ref 3.87–5.11)
RDW: 16.6 % — ABNORMAL HIGH (ref 11.5–15.5)
WBC: 9.7 10*3/uL (ref 4.0–10.5)
nRBC: 0 % (ref 0.0–0.2)

## 2023-06-27 LAB — BASIC METABOLIC PANEL
Anion gap: 7 (ref 5–15)
BUN: 12 mg/dL (ref 8–23)
CO2: 32 mmol/L (ref 22–32)
Calcium: 7.8 mg/dL — ABNORMAL LOW (ref 8.9–10.3)
Chloride: 94 mmol/L — ABNORMAL LOW (ref 98–111)
Creatinine, Ser: 0.93 mg/dL (ref 0.44–1.00)
GFR, Estimated: >60 mL/min (ref >60)
Glucose, Bld: 217 mg/dL — ABNORMAL HIGH (ref 70–99)
Potassium: 3.7 mmol/L (ref 3.5–5.1)
Sodium: 135 mmol/L (ref 135–145)

## 2023-06-27 LAB — COMPREHENSIVE METABOLIC PANEL
ALT: 12 U/L (ref 0–44)
AST: 13 U/L — ABNORMAL LOW (ref 15–41)
Albumin: 2.4 g/dL — ABNORMAL LOW (ref 3.5–5.0)
Alkaline Phosphatase: 86 U/L (ref 38–126)
Anion gap: 14 (ref 5–15)
BUN: 10 mg/dL (ref 8–23)
CO2: 32 mmol/L (ref 22–32)
Calcium: 8 mg/dL — ABNORMAL LOW (ref 8.9–10.3)
Chloride: 94 mmol/L — ABNORMAL LOW (ref 98–111)
Creatinine, Ser: 0.91 mg/dL (ref 0.44–1.00)
GFR, Estimated: >60 mL/min (ref >60)
Glucose, Bld: 185 mg/dL — ABNORMAL HIGH (ref 70–99)
Potassium: 3.5 mmol/L (ref 3.5–5.1)
Sodium: 140 mmol/L (ref 135–145)
Total Bilirubin: 0.2 mg/dL (ref <1.2)
Total Protein: 5.6 g/dL — ABNORMAL LOW (ref 6.5–8.1)

## 2023-06-27 LAB — GLUCOSE, CAPILLARY: Glucose-Capillary: 165 mg/dL — ABNORMAL HIGH (ref 70–99)

## 2023-06-27 LAB — MRSA NEXT GEN BY PCR, NASAL: MRSA by PCR Next Gen: NOT DETECTED

## 2023-06-27 LAB — MAGNESIUM
Magnesium: 1 mg/dL — ABNORMAL LOW (ref 1.7–2.4)
Magnesium: 1.8 mg/dL (ref 1.7–2.4)

## 2023-06-27 MED ORDER — MAGNESIUM SULFATE 4 GM/100ML IV SOLN
4.0000 g | Freq: Once | INTRAVENOUS | Status: AC
  Administered 2023-06-27: 4 g via INTRAVENOUS
  Filled 2023-06-27: qty 100

## 2023-06-27 MED ORDER — HALOPERIDOL LACTATE 5 MG/ML IJ SOLN
2.0000 mg | Freq: Once | INTRAMUSCULAR | Status: AC
  Administered 2023-06-27: 2 mg via INTRAMUSCULAR
  Filled 2023-06-27: qty 1

## 2023-06-27 MED ORDER — LORAZEPAM 2 MG/ML IJ SOLN
2.0000 mg | Freq: Once | INTRAMUSCULAR | Status: AC
  Administered 2023-06-27: 2 mg via INTRAVENOUS
  Filled 2023-06-27: qty 1

## 2023-06-27 MED ORDER — ZIPRASIDONE MESYLATE 20 MG IM SOLR
20.0000 mg | Freq: Once | INTRAMUSCULAR | Status: DC
  Filled 2023-06-27 (×2): qty 20

## 2023-06-27 MED ORDER — POTASSIUM CHLORIDE CRYS ER 20 MEQ PO TBCR
40.0000 meq | EXTENDED_RELEASE_TABLET | Freq: Once | ORAL | Status: AC
  Administered 2023-06-27: 40 meq via ORAL
  Filled 2023-06-27: qty 2

## 2023-06-27 MED ORDER — LORAZEPAM 2 MG/ML IJ SOLN
2.0000 mg | INTRAMUSCULAR | Status: DC | PRN
  Administered 2023-06-27 – 2023-06-29 (×8): 2 mg via INTRAVENOUS
  Filled 2023-06-27 (×8): qty 1

## 2023-06-27 MED ORDER — METHYLPREDNISOLONE SODIUM SUCC 40 MG IJ SOLR
40.0000 mg | Freq: Two times a day (BID) | INTRAMUSCULAR | Status: DC
  Administered 2023-06-27 – 2023-06-29 (×4): 40 mg via INTRAVENOUS
  Filled 2023-06-27 (×4): qty 1

## 2023-06-27 NOTE — Progress Notes (Signed)
Patient awake, pleasant and cooperative at this time with no complaints of chest pain, shortness of breath, nausea or vomiting. Writer assisted patient with lunch earlier, patient tolerated well.

## 2023-06-27 NOTE — Progress Notes (Addendum)
At shift change (7am) patient noted to be very agitated, yelling and refusing care even with redirection and verbal calming. Patient insistent stating, "I am going home! You cannot give me anything I don't want including needle sticks!" Verbal calming and education provided with patient with little positive effect. Patient called husband on the phone telling him she was being discharged and to come get her.  Dr Sherryll Burger made aware and in to assess patient. Patient is confused as to why she is in the hospital and states "I'm not in the hospital, I'm at [facility] and its 2024 and Trump is gonna be president." Patient states that she walked in here with a friend to "drink coffee and smoke cigarettes." Patient would not put telemetry leads back on or O2 sat. Ativan IV given per order from Dr Sherryll Burger with good effect. Patient then allowed lab to draw blood and took her AM PO medications. Patient falling asleep and was able to be assisted to laying down in bed with head of bed elevated.

## 2023-06-27 NOTE — Progress Notes (Addendum)
Progress note  RN called due to patient being agitated and talking out of her head, she has history of bipolar disorder.  Chart was reviewed and BMP done showed that patient has low level of magnesium at 1.0, magnesium was replenished. Patient was noted to admitted due to acute on chronic respiratory failure with hypoxia and hypercarbia secondary to COPD exacerbation. ABG was checked to ensure the patient's symptoms was not due to hypercarbia, especially since she was uncooperative with BiPAP.  ABG    Component Value Date/Time   PHART 7.36 06/27/2023 0039   PCO2ART 72 (HH) 06/27/2023 0039   PO2ART 57 (L) 06/27/2023 0039   HCO3 40.7 (H) 06/27/2023 0039   TCO2 20.7 07/05/2014 1122   ACIDBASEDEF 0.7 07/05/2014 1122   O2SAT 90.7 06/27/2023 0039   Prior pCO2 was 84.  She was still uncooperative with BiPAP.  Around 6:30 AM, patient became more agitated and yelling.  Magnesium was already replenished, but repeat BMP was still pending. Patient's rhythm strip calculated was above 420 ms.  IM Haldol 2 mg x 1 was given, with plan to repeat the dose within the next hour if patient continues to be agitated. Magnesium will continue to be replenished based on pending BMP.  Please refer to patient's H&P and progress notes for details regarding the care of this patient  Total time:  33 minutes This includes time reviewing the chart including progress notes, labs, EKGs, taking medical decisions, ordering labs and documenting findings.

## 2023-06-27 NOTE — Progress Notes (Signed)
Pt confused, oriented to person only. Pt seems to wax and wane with confusion. Pt is extremely agitated, verbally aggressive to staff, pulling off medical equipment and yelling out. Dr. Thomes Dinning notified and requested to bedside.

## 2023-06-27 NOTE — Plan of Care (Signed)

## 2023-06-27 NOTE — Progress Notes (Signed)
Patient still awake at 0210 talking a mile a minute.

## 2023-06-27 NOTE — Progress Notes (Signed)
Date and time results received: 06/27/23 1447 (use smartphrase ".now" to insert current time)  Test: Venous Blood Gas  Critical Value: PCO2 80  Name of Provider Notified: Dr Sherryll Burger  Orders Received? Or Actions Taken?:  No new orders currently

## 2023-06-27 NOTE — Plan of Care (Signed)
  Problem: Clinical Measurements: Goal: Respiratory complications will improve Outcome: Progressing Goal: Cardiovascular complication will be avoided Outcome: Progressing   Problem: Activity: Goal: Risk for activity intolerance will decrease Outcome: Progressing   Problem: Elimination: Goal: Will not experience complications related to bowel motility Outcome: Progressing Goal: Will not experience complications related to urinary retention Outcome: Progressing   Problem: Pain Management: Goal: General experience of comfort will improve Outcome: Progressing   Problem: Coping: Goal: Level of anxiety will decrease Outcome: Not Progressing

## 2023-06-27 NOTE — Progress Notes (Signed)
PROGRESS NOTE    SHRONDA Peterson  WUJ:[id number] DOB: Nov 06, 1955 DOA: 06/26/2023 PCP: Kara Pacer, NP   Brief Narrative:    [name] is a 67 y/o female with a history of non-small cell lung cancer status post empiric XRT, tobacco abuse, chronic respiratory failure on 4 L, previous history of cocaine abuse, bipolar disorder, diabetes mellitus type 2, hypertension, hyperlipidemia, d CHF presenting with 1 week history of shortness of breath.  The patient presented in respiratory distress and was placed on BiPAP.  She was admitted with acute on chronic hypoxemic and hypercapnic respiratory failure in the setting of acute COPD exacerbation and has some acute metabolic encephalopathy.  She has had some agitation related to her bipolar disorder as well.  Assessment & Plan:   Principal Problem:   Acute on chronic respiratory failure with hypoxia and hypercapnia (HCC) Active Problems:   Tobacco use disorder   Essential hypertension   Polysubstance abuse (HCC)   Malignant neoplasm of upper lobe of left lung (HCC)   COPD with acute exacerbation (HCC)   Chronic heart failure with preserved ejection fraction (HFpEF) (HCC)  Assessment and Plan:   Acute on chronic respiratory failure with hypoxia and hypercarbia -Secondary to COPD exacerbation -Weaned off of BiPAP and currently remains on nasal cannula -Chronically on 4 L nasal cannula at home -Aim for oxygen saturation 88-92% -pCO2 levels improving, follow venous blood gas in a.m.   COPD exacerbation-improving -Start DuoNebs -start Pulmicort -Start Brovana -Continue IV Solu-Medrol -Respiratory panel negative   Acute metabolic encephalopathy -secondery to hypoxia and hypercarbia and respiratory failure -UA negative -Continues to have some agitation requiring Ativan/Haldol   Essential hypertension -Holding amlodipine secondary to soft blood pressure   Chronic diastolic CHF (chronic HFimpEF) -The  patient is clinically euvolemic -09/25/2022 echo EF 65 to 70%, no WMA, normal RVF, trivial MR -01/2022 echo EF 40-45% prescription -hold torsemide due to soft BPs   Tobacco abuse -The patient continues to smoke 1 pack/day -Tobacco cessation discussed   Diabetes mellitus type 2, controlled with hyperglycemia -Hemoglobin A1c -Sliding scale -Anticipated elevated CBGs secondary to steroids -Hold metformin   Mixed hyperlipidemia -Continue statin   Non-small cell lung cancer -previously underwent empiric radiation therapy to her left lung as felt to be too high risk for biopsy given her severe COPD. CT Scan in 01/2023 did show interval decrease in size of the left apical lung lesion.    Polysubstance abuse to include cocaine and tobacco -Educated on absolute need for cessation -Currently on Nicotine 21 mg Transdermal Patch q. 24   Bipolar Disorder/Severe Depression -Continue usual home medications with Olanzapine 5 mg po Daily     DVT prophylaxis:Lovenox Code Status: Full Family Communication: Husband on phone Disposition Plan:  Status is: Inpatient Remains inpatient appropriate because: Need for IV medications   Consultants:  None  Procedures:  None  Antimicrobials:  Anti-infectives (From admission, onward)    Start     Dose/Rate Route Frequency Ordered Stop   06/26/23 1815  doxycycline (VIBRAMYCIN) 100 mg in dextrose 5 % 250 mL IVPB        100 mg 125 mL/hr over 120 Minutes Intravenous Every 12 hours 06/26/23 1723     06/26/23 0915  azithromycin (ZITHROMAX) 500 mg in sodium chloride 0.9 % 250 mL IVPB        500 mg 250 mL/hr over 60 Minutes Intravenous  Once 06/26/23 0904 06/26/23 1118       Subjective: Patient seen and evaluated today  with moments of agitation overnight as well as this morning and desire to leave AGAINST MEDICAL ADVICE.  She had significant amounts of confusion and did not know her situation or where she was.  She has required Haldol and Ativan to  assist with her anxiety.  Objective: Vitals:   06/27/23 1246 06/27/23 1300 06/27/23 1340 06/27/23 1400  BP:  (!) 112/39 (!) 112/58 118/60  Pulse: (!) 108 (!) 108 (!) 112 (!) 115  Resp: 17 18 17 20   Temp:  98 F (36.7 C)    TempSrc:  Oral    SpO2: 100% 100% 100% 100%  Weight:      Height:        Intake/Output Summary (Last 24 hours) at 06/27/2023 1409 Last data filed at 06/27/2023 1203 Gross per 24 hour  Intake 645.01 ml  Output 1300 ml  Net -654.99 ml   Filed Weights   06/26/23 1710  Weight: 69.7 kg    Examination:  General exam: Appears confused and agitated Respiratory system: Clear to auscultation. Respiratory effort normal.  Nasal cannula. Cardiovascular system: S1 & S2 heard, RRR.  Gastrointestinal system: Abdomen is soft Central nervous system: Alert and awake Extremities: No edema Skin: No significant lesions noted Psychiatry: Flat affect.    Data Reviewed: I have personally reviewed following labs and imaging studies  CBC: Recent Labs  Lab 06/26/23 0815 06/27/23 0141  WBC 13.4* 9.7  NEUTROABS 9.6*  --   HGB 11.4* 8.2*  HCT 37.3 27.6*  MCV 99.5 100.4*  PLT 473* 386   Basic Metabolic Panel: Recent Labs  Lab 06/26/23 0815 06/27/23 0141 06/27/23 0751  NA 136 140 135  K 2.4* 3.5 3.7  CL 87* 94* 94*  CO2 38* 32 32  GLUCOSE 152* 185* 217*  BUN 10 10 12   CREATININE 0.99 0.91 0.93  CALCIUM 8.9 8.0* 7.8*  MG  --  1.0*  --    GFR: Estimated Creatinine Clearance: 55.5 mL/min (by C-G formula based on SCr of 0.93 mg/dL). Liver Function Tests: Recent Labs  Lab 06/26/23 0815 06/27/23 0141  AST 17 13*  ALT 13 12  ALKPHOS 121 86  BILITOT 0.4 0.2  PROT 7.0 5.6*  ALBUMIN 2.9* 2.4*   No results for input(s): "LIPASE", "AMYLASE" in the last 168 hours. No results for input(s): "AMMONIA" in the last 168 hours. Coagulation Profile: No results for input(s): "INR", "PROTIME" in the last 168 hours. Cardiac Enzymes: No results for input(s):  "CKTOTAL", "CKMB", "CKMBINDEX", "TROPONINI" in the last 168 hours. BNP (last 3 results) No results for input(s): "PROBNP" in the last 8760 hours. HbA1C: No results for input(s): "HGBA1C" in the last 72 hours. CBG: Recent Labs  Lab 06/26/23 0820 06/27/23 0002  GLUCAP 159* 165*   Lipid Profile: No results for input(s): "CHOL", "HDL", "LDLCALC", "TRIG", "CHOLHDL", "LDLDIRECT" in the last 72 hours. Thyroid Function Tests: No results for input(s): "TSH", "T4TOTAL", "FREET4", "T3FREE", "THYROIDAB" in the last 72 hours. Anemia Panel: No results for input(s): "VITAMINB12", "FOLATE", "FERRITIN", "TIBC", "IRON", "RETICCTPCT" in the last 72 hours. Sepsis Labs: Recent Labs  Lab 06/26/23 1804  PROCALCITON 92.55    Recent Results (from the past 240 hours)  Resp panel by RT-PCR (RSV, Flu A&B, Covid) Anterior Nasal Swab     Status: None   Collection Time: 06/26/23  8:22 AM   Specimen: Anterior Nasal Swab  Result Value Ref Range Status   SARS Coronavirus 2 by RT PCR NEGATIVE NEGATIVE Final    Comment: (NOTE) SARS-CoV-2 target  nucleic acids are NOT DETECTED.  The SARS-CoV-2 RNA is generally detectable in upper respiratory specimens during the acute phase of infection. The lowest concentration of SARS-CoV-2 viral copies this assay can detect is 138 copies/mL. A negative result does not preclude SARS-Cov-2 infection and should not be used as the sole basis for treatment or other patient management decisions. A negative result may occur with  improper specimen collection/handling, submission of specimen other than nasopharyngeal swab, presence of viral mutation(s) within the areas targeted by this assay, and inadequate number of viral copies(<138 copies/mL). A negative result must be combined with clinical observations, patient history, and epidemiological information. The expected result is Negative.  Fact Sheet for Patients:  BloggerCourse.com  Fact Sheet for  Healthcare Providers:  SeriousBroker.it  This test is no t yet approved or cleared by the [place] FDA and  has been authorized for detection and/or diagnosis of SARS-CoV-2 by FDA under an Emergency Use Authorization (EUA). This EUA will remain  in effect (meaning this test can be used) for the duration of the COVID-19 declaration under Section 564(b)(1) of the Act, 21 U.S.C.section 360bbb-3(b)(1), unless the authorization is terminated  or revoked sooner.       Influenza A by PCR NEGATIVE NEGATIVE Final   Influenza B by PCR NEGATIVE NEGATIVE Final    Comment: (NOTE) The Xpert Xpress SARS-CoV-2/FLU/RSV plus assay is intended as an aid in the diagnosis of influenza from Nasopharyngeal swab specimens and should not be used as a sole basis for treatment. Nasal washings and aspirates are unacceptable for Xpert Xpress SARS-CoV-2/FLU/RSV testing.  Fact Sheet for Patients: BloggerCourse.com  Fact Sheet for Healthcare Providers: SeriousBroker.it  This test is not yet approved or cleared by the [place] FDA and has been authorized for detection and/or diagnosis of SARS-CoV-2 by FDA under an Emergency Use Authorization (EUA). This EUA will remain in effect (meaning this test can be used) for the duration of the COVID-19 declaration under Section 564(b)(1) of the Act, 21 U.S.C. section 360bbb-3(b)(1), unless the authorization is terminated or revoked.     Resp Syncytial Virus by PCR NEGATIVE NEGATIVE Final    Comment: (NOTE) Fact Sheet for Patients: BloggerCourse.com  Fact Sheet for Healthcare Providers: SeriousBroker.it  This test is not yet approved or cleared by the [place] FDA and has been authorized for detection and/or diagnosis of SARS-CoV-2 by FDA under an Emergency Use Authorization (EUA). This EUA will remain in effect (meaning this  test can be used) for the duration of the COVID-19 declaration under Section 564(b)(1) of the Act, 21 U.S.C. section 360bbb-3(b)(1), unless the authorization is terminated or revoked.  Performed at [facility], [address] [place], [place] [address]   Respiratory (~20 pathogens) panel by PCR     Status: None   Collection Time: 06/26/23  8:22 AM   Specimen: Nasopharyngeal Swab; Respiratory  Result Value Ref Range Status   Adenovirus NOT DETECTED NOT DETECTED Final   Coronavirus 229E NOT DETECTED NOT DETECTED Final    Comment: (NOTE) The Coronavirus on the Respiratory Panel, DOES NOT test for the novel  Coronavirus (2019 nCoV)    Coronavirus HKU1 NOT DETECTED NOT DETECTED Final   Coronavirus NL63 NOT DETECTED NOT DETECTED Final   Coronavirus OC43 NOT DETECTED NOT DETECTED Final   Metapneumovirus NOT DETECTED NOT DETECTED Final   Rhinovirus / Enterovirus NOT DETECTED NOT DETECTED Final   Influenza A NOT DETECTED NOT DETECTED Final   Influenza B NOT DETECTED NOT DETECTED Final  Parainfluenza Virus 1 NOT DETECTED NOT DETECTED Final   Parainfluenza Virus 2 NOT DETECTED NOT DETECTED Final   Parainfluenza Virus 3 NOT DETECTED NOT DETECTED Final   Parainfluenza Virus 4 NOT DETECTED NOT DETECTED Final   Respiratory Syncytial Virus NOT DETECTED NOT DETECTED Final   Bordetella pertussis NOT DETECTED NOT DETECTED Final   Bordetella Parapertussis NOT DETECTED NOT DETECTED Final   Chlamydophila pneumoniae NOT DETECTED NOT DETECTED Final   Mycoplasma pneumoniae NOT DETECTED NOT DETECTED Final    Comment: Performed at [facility], [address], [place], [place] [address]  MRSA Next Gen by PCR, Nasal     Status: None   Collection Time: 06/26/23  5:02 PM   Specimen: Nasal Mucosa; Nasal Swab  Result Value Ref Range Status   MRSA by PCR Next Gen NOT DETECTED NOT DETECTED Final    Comment: (NOTE) The GeneXpert MRSA Assay (FDA approved for NASAL specimens only), is one component of a  comprehensive MRSA colonization surveillance program. It is not intended to diagnose MRSA infection nor to guide or monitor treatment for MRSA infections. Test performance is not FDA approved in patients less than 23 years old. Performed at [facility], [address] [place], [place] [address]          Radiology Studies: [facility] Chest [facility] 1 View Result Date: 06/26/2023 CLINICAL DATA:  Dyspnea EXAM: PORTABLE CHEST 1 VIEW COMPARISON:  06/02/2023 chest radiograph. FINDINGS: Stable cardiomediastinal silhouette with borderline mild cardiomegaly. No pneumothorax. No pleural effusion. Mild diffuse prominence of the parahilar interstitial markings, unchanged. IMPRESSION: 1. Borderline mild cardiomegaly. 2. Unchanged mild diffuse prominence of the parahilar interstitial markings, favoring mild interstitial pulmonary edema. Electronically Signed   By: Delbert Phenix M.D.   On: 06/26/2023 09:02        Scheduled Meds:  arformoterol  15 mcg Nebulization BID   budesonide (PULMICORT) nebulizer solution  0.5 mg Nebulization BID   Chlorhexidine Gluconate Cloth  6 each Topical Daily   DULoxetine  40 mg Oral Daily   enoxaparin (LOVENOX) injection  40 mg Subcutaneous Q24H   feeding supplement  237 mL Oral BID BM   ipratropium-albuterol  3 mL Nebulization Q6H   methylPREDNISolone (SOLU-MEDROL) injection  40 mg Intravenous Q12H   nicotine  21 mg Transdermal Daily   OLANZapine  5 mg Oral Daily   pantoprazole (PROTONIX) IV  40 mg Intravenous Q12H   rOPINIRole  3 mg Oral QHS   rosuvastatin  10 mg Oral QPM   Continuous Infusions:  doxycycline (VIBRAMYCIN) IV 100 mg (06/27/23 1050)     LOS: 1 day    Time spent: 65 minutes    Zakariyah Freimark D Sherryll Burger, DO [facility]  If 7PM-7AM, please contact night-coverage [email or web address] 06/27/2023, 2:09 PM
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
## 2023-06-28 DIAGNOSIS — J9622 Acute and chronic respiratory failure with hypercapnia: Secondary | ICD-10-CM | POA: Diagnosis not present

## 2023-06-28 DIAGNOSIS — J9621 Acute and chronic respiratory failure with hypoxia: Secondary | ICD-10-CM | POA: Diagnosis not present

## 2023-06-28 LAB — BLOOD GAS, VENOUS
Acid-Base Excess: 11.4 mmol/L — ABNORMAL HIGH (ref 0.0–2.0)
Acid-Base Excess: 11.5 mmol/L — ABNORMAL HIGH (ref 0.0–2.0)
Bicarbonate: 40.3 mmol/L — ABNORMAL HIGH (ref 20.0–28.0)
Bicarbonate: 41.2 mmol/L — ABNORMAL HIGH (ref 20.0–28.0)
Drawn by: 53361
Drawn by: 1391
O2 Saturation: 97.1 %
O2 Saturation: 90.4 %
Patient temperature: 36.2
Patient temperature: 36.6
pCO2, Ven: 70 mm[Hg] — ABNORMAL HIGH (ref 44–60)
pCO2, Ven: 78 mm[Hg] (ref 44–60)
pH, Ven: 7.36 (ref 7.25–7.43)
pH, Ven: 7.33 (ref 7.25–7.43)
pO2, Ven: 82 mm[Hg] — ABNORMAL HIGH (ref 32–45)
pO2, Ven: 56 mm[Hg] — ABNORMAL HIGH (ref 32–45)

## 2023-06-28 LAB — CBC
HCT: 27.4 % — ABNORMAL LOW (ref 36.0–46.0)
Hemoglobin: 8.3 g/dL — ABNORMAL LOW (ref 12.0–15.0)
MCH: 29.9 pg (ref 26.0–34.0)
MCHC: 30.3 g/dL (ref 30.0–36.0)
MCV: 98.6 fL (ref 80.0–100.0)
Platelets: 414 10*3/uL — ABNORMAL HIGH (ref 150–400)
RBC: 2.78 MIL/uL — ABNORMAL LOW (ref 3.87–5.11)
RDW: 16.2 % — ABNORMAL HIGH (ref 11.5–15.5)
WBC: 13.1 10*3/uL — ABNORMAL HIGH (ref 4.0–10.5)
nRBC: 0 % (ref 0.0–0.2)

## 2023-06-28 LAB — BASIC METABOLIC PANEL
Anion gap: 10 (ref 5–15)
BUN: 14 mg/dL (ref 8–23)
CO2: 35 mmol/L — ABNORMAL HIGH (ref 22–32)
Calcium: 8.2 mg/dL — ABNORMAL LOW (ref 8.9–10.3)
Chloride: 92 mmol/L — ABNORMAL LOW (ref 98–111)
Creatinine, Ser: 0.81 mg/dL (ref 0.44–1.00)
GFR, Estimated: >60 mL/min (ref >60)
Glucose, Bld: 198 mg/dL — ABNORMAL HIGH (ref 70–99)
Potassium: 3.6 mmol/L (ref 3.5–5.1)
Sodium: 137 mmol/L (ref 135–145)

## 2023-06-28 LAB — HEMOGLOBIN A1C
Hgb A1c MFr Bld: 5.8 % — ABNORMAL HIGH (ref 4.8–5.6)
Hgb A1c MFr Bld: 5.8 % — ABNORMAL HIGH (ref 4.8–5.6)
Mean Plasma Glucose: 120 mg/dL
Mean Plasma Glucose: 120 mg/dL

## 2023-06-28 LAB — MAGNESIUM: Magnesium: 1.7 mg/dL (ref 1.7–2.4)

## 2023-06-28 NOTE — Progress Notes (Signed)
PROGRESS NOTE    [name]  LKG:[id number] DOB: 1955/12/25 DOA: 06/26/2023 PCP: Kara Pacer, NP   Brief Narrative:    [name] is a 67 y/o female with a history of non-small cell lung cancer status post empiric XRT, tobacco abuse, chronic respiratory failure on 4 L, previous history of cocaine abuse, bipolar disorder, diabetes mellitus type 2, hypertension, hyperlipidemia, d CHF presenting with 1 week history of shortness of breath.  The patient presented in respiratory distress and was placed on BiPAP.  She was admitted with acute on chronic hypoxemic and hypercapnic respiratory failure in the setting of acute COPD exacerbation and has some acute metabolic encephalopathy.  She has had some agitation related to her bipolar disorder as well.  Assessment & Plan:   Principal Problem:   Acute on chronic respiratory failure with hypoxia and hypercapnia (HCC) Active Problems:   Tobacco use disorder   Essential hypertension   Polysubstance abuse (HCC)   Malignant neoplasm of upper lobe of left lung (HCC)   COPD with acute exacerbation (HCC)   Chronic heart failure with preserved ejection fraction (HFpEF) (HCC)  Assessment and Plan:   Acute on chronic respiratory failure with hypoxia and hypercarbia -Secondary to COPD exacerbation -Weaned off of BiPAP and currently remains on nasal cannula -Chronically on 4 L nasal cannula at home -Aim for oxygen saturation 88-92% -pCO2 levels improving, follow venous blood gas in a.m.   COPD exacerbation-improving -Start DuoNebs -start Pulmicort -Start Brovana -Continue IV Solu-Medrol -Respiratory panel negative -Continue to use BiPAP as needed otherwise wean   Acute metabolic encephalopathy -secondery to hypoxia and hypercarbia and respiratory failure -UA negative -Continues to have some agitation requiring Ativan/Haldol   Essential hypertension -Holding amlodipine secondary to soft blood pressure    Chronic diastolic CHF (chronic HFimpEF) -The patient is clinically euvolemic -09/25/2022 echo EF 65 to 70%, no WMA, normal RVF, trivial MR -01/2022 echo EF 40-45% prescription -hold torsemide due to soft BPs   Tobacco abuse -The patient continues to smoke 1 pack/day -Tobacco cessation discussed   Diabetes mellitus type 2, controlled with hyperglycemia -Hemoglobin A1c -Sliding scale -Anticipated elevated CBGs secondary to steroids -Hold metformin   Mixed hyperlipidemia -Continue statin   Non-small cell lung cancer -previously underwent empiric radiation therapy to her left lung as felt to be too high risk for biopsy given her severe COPD. CT Scan in 01/2023 did show interval decrease in size of the left apical lung lesion.    Polysubstance abuse to include cocaine and tobacco -Educated on absolute need for cessation -Currently on Nicotine 21 mg Transdermal Patch q. 24   Bipolar Disorder/Severe Depression -Continue usual home medications with Olanzapine 5 mg po Daily     DVT prophylaxis:Lovenox Code Status: Full Family Communication: Husband on phone Disposition Plan:  Status is: Inpatient Remains inpatient appropriate because: Need for IV medications   Consultants:  None  Procedures:  None  Antimicrobials:  Anti-infectives (From admission, onward)    Start     Dose/Rate Route Frequency Ordered Stop   06/26/23 1815  doxycycline (VIBRAMYCIN) 100 mg in dextrose 5 % 250 mL IVPB        100 mg 125 mL/hr over 120 Minutes Intravenous Every 12 hours 06/26/23 1723     06/26/23 0915  azithromycin (ZITHROMAX) 500 mg in sodium chloride 0.9 % 250 mL IVPB        500 mg 250 mL/hr over 60 Minutes Intravenous  Once 06/26/23 0904 06/26/23 1118  Subjective: Patient seen and evaluated today and is currently sedated on on BiPAP.  No acute events or concerns noted overnight.  Objective: Vitals:   06/28/23 0828 06/28/23 0834 06/28/23 0900 06/28/23 1000  BP:   (!) 98/55  107/62  Pulse:   (!) 103 (!) 101  Resp:   19 19  Temp:      TempSrc:      SpO2: 100% 100% 100% 100%  Weight:      Height:        Intake/Output Summary (Last 24 hours) at 06/28/2023 1045 Last data filed at 06/28/2023 0600 Gross per 24 hour  Intake 278.42 ml  Output 1800 ml  Net -1521.58 ml   Filed Weights   06/26/23 1710  Weight: 69.7 kg    Examination:  General exam: Appears somnolent Respiratory system: Clear to auscultation. Respiratory effort normal.  Currently on BiPAP FiO2 40% Cardiovascular system: S1 & S2 heard, RRR.  Gastrointestinal system: Abdomen is soft Central nervous system: Somnolent Extremities: No edema Skin: No significant lesions noted Psychiatry: Flat affect.    Data Reviewed: I have personally reviewed following labs and imaging studies  CBC: Recent Labs  Lab 06/26/23 0815 06/27/23 0141 06/28/23 0404  WBC 13.4* 9.7 13.1*  NEUTROABS 9.6*  --   --   HGB 11.4* 8.2* 8.3*  HCT 37.3 27.6* 27.4*  MCV 99.5 100.4* 98.6  PLT 473* 386 414*   Basic Metabolic Panel: Recent Labs  Lab 06/26/23 0815 06/27/23 0141 06/27/23 0751 06/27/23 2114 06/28/23 0404  NA 136 140 135  --  137  K 2.4* 3.5 3.7  --  3.6  CL 87* 94* 94*  --  92*  CO2 38* 32 32  --  35*  GLUCOSE 152* 185* 217*  --  198*  BUN 10 10 12   --  14  CREATININE 0.99 0.91 0.93  --  0.81  CALCIUM 8.9 8.0* 7.8*  --  8.2*  MG  --  1.0*  --  1.8 1.7   GFR: Estimated Creatinine Clearance: 63.7 mL/min (by C-G formula based on SCr of 0.81 mg/dL). Liver Function Tests: Recent Labs  Lab 06/26/23 0815 06/27/23 0141  AST 17 13*  ALT 13 12  ALKPHOS 121 86  BILITOT 0.4 0.2  PROT 7.0 5.6*  ALBUMIN 2.9* 2.4*   No results for input(s): "LIPASE", "AMYLASE" in the last 168 hours. No results for input(s): "AMMONIA" in the last 168 hours. Coagulation Profile: No results for input(s): "INR", "PROTIME" in the last 168 hours. Cardiac Enzymes: No results for input(s): "CKTOTAL", "CKMB",  "CKMBINDEX", "TROPONINI" in the last 168 hours. BNP (last 3 results) No results for input(s): "PROBNP" in the last 8760 hours. HbA1C: Recent Labs    06/26/23 1804 06/27/23 0141  HGBA1C 5.8* 5.8*   CBG: Recent Labs  Lab 06/26/23 0820 06/27/23 0002  GLUCAP 159* 165*   Lipid Profile: No results for input(s): "CHOL", "HDL", "LDLCALC", "TRIG", "CHOLHDL", "LDLDIRECT" in the last 72 hours. Thyroid Function Tests: No results for input(s): "TSH", "T4TOTAL", "FREET4", "T3FREE", "THYROIDAB" in the last 72 hours. Anemia Panel: No results for input(s): "VITAMINB12", "FOLATE", "FERRITIN", "TIBC", "IRON", "RETICCTPCT" in the last 72 hours. Sepsis Labs: Recent Labs  Lab 06/26/23 1804  PROCALCITON 92.55    Recent Results (from the past 240 hours)  Resp panel by RT-PCR (RSV, Flu A&B, Covid) Anterior Nasal Swab     Status: None   Collection Time: 06/26/23  8:22 AM   Specimen: Anterior Nasal Swab  Result Value  Ref Range Status   SARS Coronavirus 2 by RT PCR NEGATIVE NEGATIVE Final    Comment: (NOTE) SARS-CoV-2 target nucleic acids are NOT DETECTED.  The SARS-CoV-2 RNA is generally detectable in upper respiratory specimens during the acute phase of infection. The lowest concentration of SARS-CoV-2 viral copies this assay can detect is 138 copies/mL. A negative result does not preclude SARS-Cov-2 infection and should not be used as the sole basis for treatment or other patient management decisions. A negative result may occur with  improper specimen collection/handling, submission of specimen other than nasopharyngeal swab, presence of viral mutation(s) within the areas targeted by this assay, and inadequate number of viral copies(<138 copies/mL). A negative result must be combined with clinical observations, patient history, and epidemiological information. The expected result is Negative.  Fact Sheet for Patients:  BloggerCourse.com  Fact Sheet for  Healthcare Providers:  SeriousBroker.it  This test is no t yet approved or cleared by the [place] FDA and  has been authorized for detection and/or diagnosis of SARS-CoV-2 by FDA under an Emergency Use Authorization (EUA). This EUA will remain  in effect (meaning this test can be used) for the duration of the COVID-19 declaration under Section 564(b)(1) of the Act, 21 U.S.C.section 360bbb-3(b)(1), unless the authorization is terminated  or revoked sooner.       Influenza A by PCR NEGATIVE NEGATIVE Final   Influenza B by PCR NEGATIVE NEGATIVE Final    Comment: (NOTE) The Xpert Xpress SARS-CoV-2/FLU/RSV plus assay is intended as an aid in the diagnosis of influenza from Nasopharyngeal swab specimens and should not be used as a sole basis for treatment. Nasal washings and aspirates are unacceptable for Xpert Xpress SARS-CoV-2/FLU/RSV testing.  Fact Sheet for Patients: BloggerCourse.com  Fact Sheet for Healthcare Providers: SeriousBroker.it  This test is not yet approved or cleared by the [place] FDA and has been authorized for detection and/or diagnosis of SARS-CoV-2 by FDA under an Emergency Use Authorization (EUA). This EUA will remain in effect (meaning this test can be used) for the duration of the COVID-19 declaration under Section 564(b)(1) of the Act, 21 U.S.C. section 360bbb-3(b)(1), unless the authorization is terminated or revoked.     Resp Syncytial Virus by PCR NEGATIVE NEGATIVE Final    Comment: (NOTE) Fact Sheet for Patients: BloggerCourse.com  Fact Sheet for Healthcare Providers: SeriousBroker.it  This test is not yet approved or cleared by the [place] FDA and has been authorized for detection and/or diagnosis of SARS-CoV-2 by FDA under an Emergency Use Authorization (EUA). This EUA will remain in effect (meaning this  test can be used) for the duration of the COVID-19 declaration under Section 564(b)(1) of the Act, 21 U.S.C. section 360bbb-3(b)(1), unless the authorization is terminated or revoked.  Performed at [facility], [address] [place], [place] [address]   Respiratory (~20 pathogens) panel by PCR     Status: None   Collection Time: 06/26/23  8:22 AM   Specimen: Nasopharyngeal Swab; Respiratory  Result Value Ref Range Status   Adenovirus NOT DETECTED NOT DETECTED Final   Coronavirus 229E NOT DETECTED NOT DETECTED Final    Comment: (NOTE) The Coronavirus on the Respiratory Panel, DOES NOT test for the novel  Coronavirus (2019 nCoV)    Coronavirus HKU1 NOT DETECTED NOT DETECTED Final   Coronavirus NL63 NOT DETECTED NOT DETECTED Final   Coronavirus OC43 NOT DETECTED NOT DETECTED Final   Metapneumovirus NOT DETECTED NOT DETECTED Final   Rhinovirus / Enterovirus NOT DETECTED NOT DETECTED  Final   Influenza A NOT DETECTED NOT DETECTED Final   Influenza B NOT DETECTED NOT DETECTED Final   Parainfluenza Virus 1 NOT DETECTED NOT DETECTED Final   Parainfluenza Virus 2 NOT DETECTED NOT DETECTED Final   Parainfluenza Virus 3 NOT DETECTED NOT DETECTED Final   Parainfluenza Virus 4 NOT DETECTED NOT DETECTED Final   Respiratory Syncytial Virus NOT DETECTED NOT DETECTED Final   Bordetella pertussis NOT DETECTED NOT DETECTED Final   Bordetella Parapertussis NOT DETECTED NOT DETECTED Final   Chlamydophila pneumoniae NOT DETECTED NOT DETECTED Final   Mycoplasma pneumoniae NOT DETECTED NOT DETECTED Final    Comment: Performed at [facility], [address], [place], [place] [address]  MRSA Next Gen by PCR, Nasal     Status: None   Collection Time: 06/26/23  5:02 PM   Specimen: Nasal Mucosa; Nasal Swab  Result Value Ref Range Status   MRSA by PCR Next Gen NOT DETECTED NOT DETECTED Final    Comment: (NOTE) The GeneXpert MRSA Assay (FDA approved for NASAL specimens only), is one component of a  comprehensive MRSA colonization surveillance program. It is not intended to diagnose MRSA infection nor to guide or monitor treatment for MRSA infections. Test performance is not FDA approved in patients less than 10 years old. Performed at [facility], [address], [place], [place] [address]          Radiology Studies: No results found.       Scheduled Meds:  arformoterol  15 mcg Nebulization BID   budesonide (PULMICORT) nebulizer solution  0.5 mg Nebulization BID   Chlorhexidine Gluconate Cloth  6 each Topical Daily   DULoxetine  40 mg Oral Daily   enoxaparin (LOVENOX) injection  40 mg Subcutaneous Q24H   feeding supplement  237 mL Oral BID BM   ipratropium-albuterol  3 mL Nebulization Q6H   methylPREDNISolone (SOLU-MEDROL) injection  40 mg Intravenous Q12H   nicotine  21 mg Transdermal Daily   OLANZapine  5 mg Oral Daily   pantoprazole (PROTONIX) IV  40 mg Intravenous Q12H   rOPINIRole  3 mg Oral QHS   rosuvastatin  10 mg Oral QPM   Continuous Infusions:  doxycycline (VIBRAMYCIN) IV 100 mg (06/27/23 2243)     LOS: 2 days    Time spent: 55 minutes    Imara Standiford D Sherryll Burger, DO [facility]  If 7PM-7AM, please contact night-coverage [email or web address] 06/28/2023, 10:45 AM

## 2023-06-28 NOTE — Care Management Important Message (Signed)
Important Message  Patient Details  Name: [name] MRN: [id number] Date of Birth: 10/31/1955   Important Message Given:  Yes - Medicare IM     Corey Harold 06/28/2023, 11:56 AM

## 2023-06-28 NOTE — TOC Progression Note (Signed)
Transition of Care [facility]) - Progression Note    Patient Details  Name: [name] MRN: [id number] Date of Birth: September 18, 1955  Transition of Care [facility] CM/SW Contact  Elliot Gault, LCSW Phone Number: 06/28/2023, 9:47 AM  Clinical Narrative:      TOC following. RN flowsheet indicating pt only oriented to self currently. Will follow up when pt fully oriented.   Barriers to Discharge: Continued Medical Work up  Expected Discharge Plan and Services       Living arrangements for the past 2 months: Single Family Home                                       Social Determinants of Health (SDOH) Interventions SDOH Screenings   Food Insecurity: No Food Insecurity (06/26/2023)  Housing: Low Risk  (06/26/2023)  Transportation Needs: No Transportation Needs (06/26/2023)  Utilities: Not At Risk (06/26/2023)  Alcohol Screen: Low Risk  (12/31/2020)  Depression (PHQ2-9): Medium Risk (12/31/2020)  Financial Resource Strain: High Risk (12/31/2020)  Physical Activity: Insufficiently Active (12/31/2020)  Social Connections: Moderately Isolated (12/31/2020)  Stress: Stress Concern Present (12/31/2020)  Tobacco Use: High Risk (06/26/2023)    Readmission Risk Interventions    06/27/2023    1:43 PM 05/30/2023    6:37 PM  Readmission Risk Prevention Plan  Transportation Screening Complete Complete  Medication Review (RN Care Manager) Complete Complete  PCP or Specialist appointment within 3-5 days of discharge Not Complete   HRI or Home Care Consult Not Complete Complete  SW Recovery Care/Counseling Consult Complete   Palliative Care Screening Not Applicable Not Applicable  Skilled Nursing Facility Complete Not Applicable

## 2023-06-29 DIAGNOSIS — J9622 Acute and chronic respiratory failure with hypercapnia: Secondary | ICD-10-CM | POA: Diagnosis not present

## 2023-06-29 DIAGNOSIS — J9621 Acute and chronic respiratory failure with hypoxia: Secondary | ICD-10-CM | POA: Diagnosis not present

## 2023-06-29 LAB — BLOOD GAS, VENOUS
Acid-Base Excess: 10.7 mmol/L — ABNORMAL HIGH (ref 0.0–2.0)
Bicarbonate: 38.5 mmol/L — ABNORMAL HIGH (ref 20.0–28.0)
Drawn by: 6509
O2 Saturation: 76.7 %
Patient temperature: 36.4
pCO2, Ven: 63 mm[Hg] — ABNORMAL HIGH (ref 44–60)
pH, Ven: 7.39 (ref 7.25–7.43)
pO2, Ven: 39 mm[Hg] (ref 32–45)

## 2023-06-29 LAB — BASIC METABOLIC PANEL
Anion gap: 7 (ref 5–15)
BUN: 12 mg/dL (ref 8–23)
CO2: 34 mmol/L — ABNORMAL HIGH (ref 22–32)
Calcium: 8.3 mg/dL — ABNORMAL LOW (ref 8.9–10.3)
Chloride: 96 mmol/L — ABNORMAL LOW (ref 98–111)
Creatinine, Ser: 0.69 mg/dL (ref 0.44–1.00)
GFR, Estimated: >60 mL/min (ref >60)
Glucose, Bld: 225 mg/dL — ABNORMAL HIGH (ref 70–99)
Potassium: 3.8 mmol/L (ref 3.5–5.1)
Sodium: 137 mmol/L (ref 135–145)

## 2023-06-29 LAB — CBC
HCT: 29.6 % — ABNORMAL LOW (ref 36.0–46.0)
Hemoglobin: 8.7 g/dL — ABNORMAL LOW (ref 12.0–15.0)
MCH: 28.9 pg (ref 26.0–34.0)
MCHC: 29.4 g/dL — ABNORMAL LOW (ref 30.0–36.0)
MCV: 98.3 fL (ref 80.0–100.0)
Platelets: 463 10*3/uL — ABNORMAL HIGH (ref 150–400)
RBC: 3.01 MIL/uL — ABNORMAL LOW (ref 3.87–5.11)
RDW: 16.2 % — ABNORMAL HIGH (ref 11.5–15.5)
WBC: 14.7 10*3/uL — ABNORMAL HIGH (ref 4.0–10.5)
nRBC: 0 % (ref 0.0–0.2)

## 2023-06-29 LAB — MAGNESIUM: Magnesium: 1.4 mg/dL — ABNORMAL LOW (ref 1.7–2.4)

## 2023-06-29 MED ORDER — MAGNESIUM SULFATE 2 GM/50ML IV SOLN
2.0000 g | Freq: Once | INTRAVENOUS | Status: AC
  Administered 2023-06-29: 2 g via INTRAVENOUS
  Filled 2023-06-29: qty 50

## 2023-06-29 MED ORDER — PREDNISONE 10 MG PO TABS: 40.0000 mg | ORAL_TABLET | Freq: Every day | ORAL | 0 refills | Status: AC

## 2023-06-29 MED ORDER — ORAL CARE MOUTH RINSE: 15.0000 mL | OROMUCOSAL | Status: DC | PRN

## 2023-06-29 MED ORDER — ORAL CARE MOUTH RINSE
15.0000 mL | OROMUCOSAL | Status: DC
  Administered 2023-06-29 (×2): 15 mL via OROMUCOSAL

## 2023-06-29 NOTE — Discharge Summary (Signed)
Physician Discharge Summary  [name] EPP:[id number] DOB: [birth year] DOA: 06/26/2023  PCP: Kara Pacer, NP  Admit date: 06/26/2023  Discharge date: 06/29/2023  Admitted From:Home  Disposition:  Home  Recommendations for Outpatient Follow-up:  Follow up with PCP in 1-2 weeks Remain on prednisone as prescribed for several more days and use home breathing treatments as needed Continue home medications as below and avoid use of amlodipine and losartan given softer blood pressure readings while hospitalized  Home Health: None  Equipment/Devices: Has home 4 L nasal cannula oxygen  Discharge Condition:Stable  CODE STATUS: Full  Diet recommendation: Heart Healthy/carb modified  Brief/Interim Summary: [name] is a 67 y/o female with a history of non-small cell lung cancer status post empiric XRT, tobacco abuse, chronic respiratory failure on 4 L, previous history of cocaine abuse, bipolar disorder, diabetes mellitus type 2, hypertension, hyperlipidemia, d CHF presenting with 1 week history of shortness of breath. The patient presented in respiratory distress and was placed on BiPAP. She was admitted with acute on chronic hypoxemic and hypercapnic respiratory failure in the setting of acute COPD exacerbation and has some acute metabolic encephalopathy. She has had some agitation related to her bipolar disorder as well.  Her condition has overall improved and she is back to her baseline level of mentation with improvement in her hypercapnia.  COPD exacerbation appears significantly improved and she is back to her 4 L nasal cannula oxygen.  She is now in stable condition for discharge and remains high risk for readmission.  No other acute events or concerns noted.  Discharge Diagnoses:  Principal Problem:   Acute on chronic respiratory failure with hypoxia and hypercapnia (HCC) Active Problems:   Tobacco use disorder   Essential hypertension    Polysubstance abuse (HCC)   Malignant neoplasm of upper lobe of left lung (HCC)   COPD with acute exacerbation (HCC)   Chronic heart failure with preserved ejection fraction (HFpEF) (HCC)  Principal discharge diagnosis: Acute on chronic hypoxemic and hypercapnic respiratory failure secondary to COPD exacerbation.  Discharge Instructions  Discharge Instructions     Diet - low sodium heart healthy   Complete by: As directed    Increase activity slowly   Complete by: As directed       Allergies as of 06/29/2023       Reactions   Septra [sulfamethoxazole-trimethoprim] Other (See Comments)   Patient is unsure if she allergic to septra or penicillin. Patient states one or another caused "rib pain with a little breathing problem".   Penicillins Other (See Comments)   Patient is unsure if she allergic to penicillin or septra. Patient states one or another caused "rib pain with a little breathing problem". Tolerates cephalosporins        Medication List     STOP taking these medications    amLODipine 10 MG tablet Commonly known as: NORVASC   olmesartan 20 MG tablet Commonly known as: BENICAR       TAKE these medications    acetaminophen 500 MG tablet Commonly known as: TYLENOL Take 1,000 mg by mouth every 6 (six) hours as needed for moderate pain, fever or headache.   albuterol 108 (90 Base) MCG/ACT inhaler Commonly known as: VENTOLIN HFA Inhale 1-2 puffs into the lungs every 6 (six) hours as needed for wheezing or shortness of breath. What changed: Another medication with the same name was changed. Make sure you understand how and when to take each.   albuterol (2.5 MG/3ML) 0.083% nebulizer  solution Commonly known as: PROVENTIL INHALE 1 VIAL VIA NEBULIZER EVERY 4 HOURS What changed: See the new instructions.   ammonium lactate 12 % cream Commonly known as: AMLACTIN Apply 1 Application topically as needed for dry skin.   Breztri Aerosphere 160-9-4.8 MCG/ACT  Aero Generic drug: Budeson-Glycopyrrol-Formoterol Inhale 2 puffs into the lungs in the morning and at bedtime.   ipratropium-albuterol 0.5-2.5 (3) MG/3ML Soln Commonly known as: DUONEB Take 3 mLs by nebulization every 4 (four) hours as needed (for shortness of breath/wheezing).   Combivent Respimat 20-100 MCG/ACT Aers respimat Generic drug: Ipratropium-Albuterol Inhale 1 puff into the lungs every 6 (six) hours as needed for wheezing.   diclofenac Sodium 1 % Gel Commonly known as: VOLTAREN Apply 1 Application topically 4 (four) times daily as needed (leg pain).   DULoxetine 20 MG capsule Commonly known as: CYMBALTA Take 40 mg by mouth daily.   famotidine 20 MG tablet Commonly known as: Pepcid One after supper What changed:  how much to take how to take this when to take this   feeding supplement Liqd Take 237 mLs by mouth 2 (two) times daily between meals.   fluorometholone 0.1 % ophthalmic suspension Commonly known as: FML 1 drop See admin instructions.   metFORMIN 1000 MG tablet Commonly known as: GLUCOPHAGE Take 1 tablet (1,000 mg total) by mouth 2 (two) times daily.   metoprolol tartrate 25 MG tablet Commonly known as: LOPRESSOR Take 0.5 tablets (12.5 mg total) by mouth 2 (two) times daily.   nicotine 21 mg/24hr patch Commonly known as: NICODERM CQ - dosed in mg/24 hours Place 1 patch (21 mg total) onto the skin daily.   nitroGLYCERIN 0.4 MG SL tablet Commonly known as: NITROSTAT Place 0.4 mg under the tongue every 5 (five) minutes as needed for chest pain.   nystatin 100000 UNIT/ML suspension Commonly known as: MYCOSTATIN TAKE ONE TEASPOONFUL ( ) BY MOUTH EVERY MORNING AND AT BEDTIME What changed: See the new instructions.   OLANZapine 5 MG tablet Commonly known as: ZYPREXA Take 5 mg by mouth daily.   omeprazole 20 MG capsule Commonly known as: PRILOSEC Take 1 capsule (20 mg total) by mouth daily.   ondansetron 4 MG tablet Commonly known as:  ZOFRAN Take 4 mg by mouth every 8 (eight) hours as needed for nausea or vomiting.   predniSONE 10 MG tablet Commonly known as: DELTASONE Take 4 tablets (40 mg total) by mouth daily for 5 days.   promethazine 25 MG tablet Commonly known as: PHENERGAN Take 25 mg by mouth 4 (four) times daily as needed.   Restasis 0.05 % ophthalmic emulsion Generic drug: cycloSPORINE Place 1 drop into both eyes 2 (two) times daily.   rOPINIRole 1 MG tablet Commonly known as: REQUIP Take 3 mg by mouth at bedtime.   rosuvastatin 10 MG tablet Commonly known as: CRESTOR Take 10 mg by mouth every evening.   sodium chloride 0.65 % Soln nasal spray Commonly known as: OCEAN Place 1 spray into both nostrils as needed for congestion.   SUMAtriptan 25 MG tablet Commonly known as: IMITREX Take 25 mg by mouth every 2 (two) hours as needed for migraine.   torsemide 20 MG tablet Commonly known as: DEMADEX Take 1 tablet (20 mg total) by mouth daily as needed. May take an additional 20 mg daily as needed for swelling.        Follow-up Information     Nsumanganyi, Colleen Can, NP. Schedule an appointment as soon as possible for a visit in  1 week(s).   Contact information: [address] [place] [address] [phone]                Allergies  Allergen Reactions   Septra [Sulfamethoxazole-Trimethoprim] Other (See Comments)    Patient is unsure if she allergic to septra or penicillin. Patient states one or another caused "rib pain with a little breathing problem".   Penicillins Other (See Comments)    Patient is unsure if she allergic to penicillin or septra. Patient states one or another caused "rib pain with a little breathing problem". Tolerates cephalosporins    Consultations: None   Procedures/Studies: DG Chest Port 1 View Result Date: 06/26/2023 CLINICAL DATA:  Dyspnea EXAM: PORTABLE CHEST 1 VIEW COMPARISON:  06/02/2023 chest radiograph. FINDINGS: Stable  cardiomediastinal silhouette with borderline mild cardiomegaly. No pneumothorax. No pleural effusion. Mild diffuse prominence of the parahilar interstitial markings, unchanged. IMPRESSION: 1. Borderline mild cardiomegaly. 2. Unchanged mild diffuse prominence of the parahilar interstitial markings, favoring mild interstitial pulmonary edema. Electronically Signed   By: Delbert Phenix M.D.   On: 06/26/2023 09:02   DG CHEST PORT 1 VIEW Result Date: 06/02/2023 CLINICAL DATA:  67 year old female with history of shortness of breath. EXAM: PORTABLE CHEST 1 VIEW COMPARISON:  Chest x-ray 05/26/2023. FINDINGS: Lung volumes are normal. No confluent consolidative airspace disease. However, there is diffuse interstitial prominence and widespread peribronchial cuffing scattered throughout the lungs bilaterally, very similar to the prior study, although slightly worsened aeration is noted in the region of the right upper lobe where there is also some ill-defined opacities which could be indicative of developing airspace disease. No pneumothorax. No pleural effusions. No evidence of pulmonary edema. Heart size is upper limits of normal. Upper mediastinal contours are within normal limits. Atherosclerotic calcifications are noted in the thoracic aorta. IMPRESSION: 1. The appearance of the chest is concerning for probable bronchitis and developing bronchopneumonia (most evident in the right upper lobe), as above. 2. Aortic atherosclerosis. Electronically Signed   By: Trudie Reed M.D.   On: 06/02/2023 09:03     Discharge Exam: Vitals:   06/29/23 0832 06/29/23 0900  BP:  (!) 104/53  Pulse:  (!) 103  Resp:  (!) 21  Temp:    SpO2: 97% 96%   Vitals:   06/29/23 0759 06/29/23 0800 06/29/23 0832 06/29/23 0900  BP:  126/66  (!) 104/53  Pulse:  (!) 109  (!) 103  Resp:  (!) 28  (!) 21  Temp: 97.8 F (36.6 C)     TempSrc: Oral     SpO2:  92% 97% 96%  Weight:      Height:        General: Pt is alert, awake, not in  acute distress Cardiovascular: RRR, S1/S2 +, no rubs, no gallops Respiratory: CTA bilaterally, no wheezing, no rhonchi, 4 L nasal cannula Abdominal: Soft, NT, ND, bowel sounds + Extremities: no edema, no cyanosis    The results of significant diagnostics from this hospitalization (including imaging, microbiology, ancillary and laboratory) are listed below for reference.     Microbiology: Recent Results (from the past 240 hours)  Resp panel by RT-PCR (RSV, Flu A&B, Covid) Anterior Nasal Swab     Status: None   Collection Time: 06/26/23  8:22 AM   Specimen: Anterior Nasal Swab  Result Value Ref Range Status   SARS Coronavirus 2 by RT PCR NEGATIVE NEGATIVE Final    Comment: (NOTE) SARS-CoV-2 target nucleic acids are NOT DETECTED.  The SARS-CoV-2  RNA is generally detectable in upper respiratory specimens during the acute phase of infection. The lowest concentration of SARS-CoV-2 viral copies this assay can detect is 138 copies/mL. A negative result does not preclude SARS-Cov-2 infection and should not be used as the sole basis for treatment or other patient management decisions. A negative result may occur with  improper specimen collection/handling, submission of specimen other than nasopharyngeal swab, presence of viral mutation(s) within the areas targeted by this assay, and inadequate number of viral copies(<138 copies/mL). A negative result must be combined with clinical observations, patient history, and epidemiological information. The expected result is Negative.  Fact Sheet for Patients:  BloggerCourse.com  Fact Sheet for Healthcare Providers:  SeriousBroker.it  This test is no t yet approved or cleared by the [place] FDA and  has been authorized for detection and/or diagnosis of SARS-CoV-2 by FDA under an Emergency Use Authorization (EUA). This EUA will remain  in effect (meaning this test can be used) for the  duration of the COVID-19 declaration under Section 564(b)(1) of the Act, 21 U.S.C.section 360bbb-3(b)(1), unless the authorization is terminated  or revoked sooner.       Influenza A by PCR NEGATIVE NEGATIVE Final   Influenza B by PCR NEGATIVE NEGATIVE Final    Comment: (NOTE) The Xpert Xpress SARS-CoV-2/FLU/RSV plus assay is intended as an aid in the diagnosis of influenza from Nasopharyngeal swab specimens and should not be used as a sole basis for treatment. Nasal washings and aspirates are unacceptable for Xpert Xpress SARS-CoV-2/FLU/RSV testing.  Fact Sheet for Patients: BloggerCourse.com  Fact Sheet for Healthcare Providers: SeriousBroker.it  This test is not yet approved or cleared by the [place] FDA and has been authorized for detection and/or diagnosis of SARS-CoV-2 by FDA under an Emergency Use Authorization (EUA). This EUA will remain in effect (meaning this test can be used) for the duration of the COVID-19 declaration under Section 564(b)(1) of the Act, 21 U.S.C. section 360bbb-3(b)(1), unless the authorization is terminated or revoked.     Resp Syncytial Virus by PCR NEGATIVE NEGATIVE Final    Comment: (NOTE) Fact Sheet for Patients: BloggerCourse.com  Fact Sheet for Healthcare Providers: SeriousBroker.it  This test is not yet approved or cleared by the [place] FDA and has been authorized for detection and/or diagnosis of SARS-CoV-2 by FDA under an Emergency Use Authorization (EUA). This EUA will remain in effect (meaning this test can be used) for the duration of the COVID-19 declaration under Section 564(b)(1) of the Act, 21 U.S.C. section 360bbb-3(b)(1), unless the authorization is terminated or revoked.  Performed at [facility], [address] [place], [place] [address]   Respiratory (~20 pathogens) panel by PCR     Status: None    Collection Time: 06/26/23  8:22 AM   Specimen: Nasopharyngeal Swab; Respiratory  Result Value Ref Range Status   Adenovirus NOT DETECTED NOT DETECTED Final   Coronavirus 229E NOT DETECTED NOT DETECTED Final    Comment: (NOTE) The Coronavirus on the Respiratory Panel, DOES NOT test for the novel  Coronavirus (2019 nCoV)    Coronavirus HKU1 NOT DETECTED NOT DETECTED Final   Coronavirus NL63 NOT DETECTED NOT DETECTED Final   Coronavirus OC43 NOT DETECTED NOT DETECTED Final   Metapneumovirus NOT DETECTED NOT DETECTED Final   Rhinovirus / Enterovirus NOT DETECTED NOT DETECTED Final   Influenza A NOT DETECTED NOT DETECTED Final   Influenza B NOT DETECTED NOT DETECTED Final   Parainfluenza Virus 1 NOT DETECTED NOT DETECTED Final  Parainfluenza Virus 2 NOT DETECTED NOT DETECTED Final   Parainfluenza Virus 3 NOT DETECTED NOT DETECTED Final   Parainfluenza Virus 4 NOT DETECTED NOT DETECTED Final   Respiratory Syncytial Virus NOT DETECTED NOT DETECTED Final   Bordetella pertussis NOT DETECTED NOT DETECTED Final   Bordetella Parapertussis NOT DETECTED NOT DETECTED Final   Chlamydophila pneumoniae NOT DETECTED NOT DETECTED Final   Mycoplasma pneumoniae NOT DETECTED NOT DETECTED Final    Comment: Performed at [facility], [address], [place], [place] [address]  MRSA Next Gen by PCR, Nasal     Status: None   Collection Time: 06/26/23  5:02 PM   Specimen: Nasal Mucosa; Nasal Swab  Result Value Ref Range Status   MRSA by PCR Next Gen NOT DETECTED NOT DETECTED Final    Comment: (NOTE) The GeneXpert MRSA Assay (FDA approved for NASAL specimens only), is one component of a comprehensive MRSA colonization surveillance program. It is not intended to diagnose MRSA infection nor to guide or monitor treatment for MRSA infections. Test performance is not FDA approved in patients less than 84 years old. Performed at [facility], [address], [place], [place] [address]      Labs: BNP  (last 3 results) Recent Labs    04/16/23 1500 05/26/23 1401 06/26/23 0815  BNP 135.0* 68.0 133.0*   Basic Metabolic Panel: Recent Labs  Lab 06/26/23 0815 06/27/23 0141 06/27/23 0751 06/27/23 2114 06/28/23 0404 06/29/23 0528  NA 136 140 135  --  137 137  K 2.4* 3.5 3.7  --  3.6 3.8  CL 87* 94* 94*  --  92* 96*  CO2 38* 32 32  --  35* 34*  GLUCOSE 152* 185* 217*  --  198* 225*  BUN 10 10 12   --  14 12  CREATININE 0.99 0.91 0.93  --  0.81 0.69  CALCIUM 8.9 8.0* 7.8*  --  8.2* 8.3*  MG  --  1.0*  --  1.8 1.7 1.4*   Liver Function Tests: Recent Labs  Lab 06/26/23 0815 06/27/23 0141  AST 17 13*  ALT 13 12  ALKPHOS 121 86  BILITOT 0.4 0.2  PROT 7.0 5.6*  ALBUMIN 2.9* 2.4*   No results for input(s): "LIPASE", "AMYLASE" in the last 168 hours. No results for input(s): "AMMONIA" in the last 168 hours. CBC: Recent Labs  Lab 06/26/23 0815 06/27/23 0141 06/28/23 0404 06/29/23 0528  WBC 13.4* 9.7 13.1* 14.7*  NEUTROABS 9.6*  --   --   --   HGB 11.4* 8.2* 8.3* 8.7*  HCT 37.3 27.6* 27.4* 29.6*  MCV 99.5 100.4* 98.6 98.3  PLT 473* 386 414* 463*   Cardiac Enzymes: No results for input(s): "CKTOTAL", "CKMB", "CKMBINDEX", "TROPONINI" in the last 168 hours. BNP: Invalid input(s): "POCBNP" CBG: Recent Labs  Lab 06/26/23 0820 06/27/23 0002  GLUCAP 159* 165*   D-Dimer No results for input(s): "DDIMER" in the last 72 hours. Hgb A1c Recent Labs    06/26/23 1804 06/27/23 0141  HGBA1C 5.8* 5.8*   Lipid Profile No results for input(s): "CHOL", "HDL", "LDLCALC", "TRIG", "CHOLHDL", "LDLDIRECT" in the last 72 hours. Thyroid function studies No results for input(s): "TSH", "T4TOTAL", "T3FREE", "THYROIDAB" in the last 72 hours.  Invalid input(s): "FREET3" Anemia work up No results for input(s): "VITAMINB12", "FOLATE", "FERRITIN", "TIBC", "IRON", "RETICCTPCT" in the last 72 hours. Urinalysis    Component Value Date/Time   COLORURINE YELLOW 06/26/2023 2138    APPEARANCEUR CLEAR 06/26/2023 2138   LABSPEC  1.013 06/26/2023 2138   PHURINE 6.0 06/26/2023 2138   GLUCOSEU NEGATIVE 06/26/2023 2138   HGBUR MODERATE (A) 06/26/2023 2138   BILIRUBINUR NEGATIVE 06/26/2023 2138   BILIRUBINUR negative 08/01/2015 1122   KETONESUR NEGATIVE 06/26/2023 2138   PROTEINUR 100 (A) 06/26/2023 2138   UROBILINOGEN negative 08/01/2015 1122   UROBILINOGEN 0.2 09/17/2014 2143   NITRITE NEGATIVE 06/26/2023 2138   LEUKOCYTESUR NEGATIVE 06/26/2023 2138   Sepsis Labs Recent Labs  Lab 06/26/23 0815 06/27/23 0141 06/28/23 0404 06/29/23 0528  WBC 13.4* 9.7 13.1* 14.7*   Microbiology Recent Results (from the past 240 hours)  Resp panel by RT-PCR (RSV, Flu A&B, Covid) Anterior Nasal Swab     Status: None   Collection Time: 06/26/23  8:22 AM   Specimen: Anterior Nasal Swab  Result Value Ref Range Status   SARS Coronavirus 2 by RT PCR NEGATIVE NEGATIVE Final    Comment: (NOTE) SARS-CoV-2 target nucleic acids are NOT DETECTED.  The SARS-CoV-2 RNA is generally detectable in upper respiratory specimens during the acute phase of infection. The lowest concentration of SARS-CoV-2 viral copies this assay can detect is 138 copies/mL. A negative result does not preclude SARS-Cov-2 infection and should not be used as the sole basis for treatment or other patient management decisions. A negative result may occur with  improper specimen collection/handling, submission of specimen other than nasopharyngeal swab, presence of viral mutation(s) within the areas targeted by this assay, and inadequate number of viral copies(<138 copies/mL). A negative result must be combined with clinical observations, patient history, and epidemiological information. The expected result is Negative.  Fact Sheet for Patients:  BloggerCourse.com  Fact Sheet for Healthcare Providers:  SeriousBroker.it  This test is no t yet approved or cleared by  the [place] FDA and  has been authorized for detection and/or diagnosis of SARS-CoV-2 by FDA under an Emergency Use Authorization (EUA). This EUA will remain  in effect (meaning this test can be used) for the duration of the COVID-19 declaration under Section 564(b)(1) of the Act, 21 U.S.C.section 360bbb-3(b)(1), unless the authorization is terminated  or revoked sooner.       Influenza A by PCR NEGATIVE NEGATIVE Final   Influenza B by PCR NEGATIVE NEGATIVE Final    Comment: (NOTE) The Xpert Xpress SARS-CoV-2/FLU/RSV plus assay is intended as an aid in the diagnosis of influenza from Nasopharyngeal swab specimens and should not be used as a sole basis for treatment. Nasal washings and aspirates are unacceptable for Xpert Xpress SARS-CoV-2/FLU/RSV testing.  Fact Sheet for Patients: BloggerCourse.com  Fact Sheet for Healthcare Providers: SeriousBroker.it  This test is not yet approved or cleared by the [place] FDA and has been authorized for detection and/or diagnosis of SARS-CoV-2 by FDA under an Emergency Use Authorization (EUA). This EUA will remain in effect (meaning this test can be used) for the duration of the COVID-19 declaration under Section 564(b)(1) of the Act, 21 U.S.C. section 360bbb-3(b)(1), unless the authorization is terminated or revoked.     Resp Syncytial Virus by PCR NEGATIVE NEGATIVE Final    Comment: (NOTE) Fact Sheet for Patients: BloggerCourse.com  Fact Sheet for Healthcare Providers: SeriousBroker.it  This test is not yet approved or cleared by the [place] FDA and has been authorized for detection and/or diagnosis of SARS-CoV-2 by FDA under an Emergency Use Authorization (EUA). This EUA will remain in effect (meaning this test can be used) for the duration of the COVID-19 declaration under Section 564(b)(1) of the Act, 21  U.S.C.  section 360bbb-3(b)(1), unless the authorization is terminated or revoked.  Performed at [facility], [address], [place], [place] [address]   Respiratory (~20 pathogens) panel by PCR     Status: None   Collection Time: 06/26/23  8:22 AM   Specimen: Nasopharyngeal Swab; Respiratory  Result Value Ref Range Status   Adenovirus NOT DETECTED NOT DETECTED Final   Coronavirus 229E NOT DETECTED NOT DETECTED Final    Comment: (NOTE) The Coronavirus on the Respiratory Panel, DOES NOT test for the novel  Coronavirus (2019 nCoV)    Coronavirus HKU1 NOT DETECTED NOT DETECTED Final   Coronavirus NL63 NOT DETECTED NOT DETECTED Final   Coronavirus OC43 NOT DETECTED NOT DETECTED Final   Metapneumovirus NOT DETECTED NOT DETECTED Final   Rhinovirus / Enterovirus NOT DETECTED NOT DETECTED Final   Influenza A NOT DETECTED NOT DETECTED Final   Influenza B NOT DETECTED NOT DETECTED Final   Parainfluenza Virus 1 NOT DETECTED NOT DETECTED Final   Parainfluenza Virus 2 NOT DETECTED NOT DETECTED Final   Parainfluenza Virus 3 NOT DETECTED NOT DETECTED Final   Parainfluenza Virus 4 NOT DETECTED NOT DETECTED Final   Respiratory Syncytial Virus NOT DETECTED NOT DETECTED Final   Bordetella pertussis NOT DETECTED NOT DETECTED Final   Bordetella Parapertussis NOT DETECTED NOT DETECTED Final   Chlamydophila pneumoniae NOT DETECTED NOT DETECTED Final   Mycoplasma pneumoniae NOT DETECTED NOT DETECTED Final    Comment: Performed at [facility], [address] [place], [place] [address]  MRSA Next Gen by PCR, Nasal     Status: None   Collection Time: 06/26/23  5:02 PM   Specimen: Nasal Mucosa; Nasal Swab  Result Value Ref Range Status   MRSA by PCR Next Gen NOT DETECTED NOT DETECTED Final    Comment: (NOTE) The GeneXpert MRSA Assay (FDA approved for NASAL specimens only), is one component of a comprehensive MRSA colonization surveillance program. It is not intended to diagnose MRSA infection  nor to guide or monitor treatment for MRSA infections. Test performance is not FDA approved in patients less than 70 years old. Performed at [facility], [address], [place], [place] [address]      Time coordinating discharge: 35 minutes  SIGNED:   Erick Blinks, DO [facility] 06/29/2023, 11:07 AM  If 7PM-7AM, please contact night-coverage [email or web address]

## 2023-06-29 NOTE — Progress Notes (Signed)
Off BiPAP around 0300 back on [facility]
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
## 2023-06-30 ENCOUNTER — Other Ambulatory Visit: Payer: Self-pay | Admitting: Nurse Practitioner

## 2023-07-15 ENCOUNTER — Other Ambulatory Visit: Payer: Self-pay | Admitting: Pulmonary Disease

## 2023-07-15 ENCOUNTER — Institutional Professional Consult (permissible substitution) (INDEPENDENT_AMBULATORY_CARE_PROVIDER_SITE_OTHER): Payer: 59

## 2023-07-18 ENCOUNTER — Ambulatory Visit: Payer: 59 | Admitting: Nurse Practitioner

## 2023-07-19 ENCOUNTER — Other Ambulatory Visit: Payer: Self-pay | Admitting: Nurse Practitioner

## 2023-07-22 ENCOUNTER — Other Ambulatory Visit: Payer: Self-pay | Admitting: Nurse Practitioner

## 2023-07-25 ENCOUNTER — Other Ambulatory Visit (HOSPITAL_COMMUNITY): Payer: 59

## 2023-07-25 ENCOUNTER — Ambulatory Visit (HOSPITAL_COMMUNITY): Payer: 59 | Admitting: Speech Pathology

## 2023-07-28 ENCOUNTER — Telehealth: Payer: Self-pay | Admitting: Pulmonary Disease

## 2023-07-28 MED ORDER — AZITHROMYCIN 250 MG PO TABS: ORAL_TABLET | ORAL | 0 refills | Status: DC

## 2023-07-28 NOTE — Telephone Encounter (Signed)
Spoke with patient. She states that she has had a cough 3-4 days. It is dry and productive. When productive it is yellow/green. She states that her SOB is a little worse. She has chest and nasal congestion. No fever. Has not taken any OTC meds. Will not make appt. Please advise.

## 2023-07-28 NOTE — Telephone Encounter (Signed)
Patient states having symptoms of cough, mucus and chest congestion. Patient declined mychart video visit. Pharmacy is [name]. Patient phone number is [phone].

## 2023-07-28 NOTE — Telephone Encounter (Signed)
Z pak sent in. Patient has been notified.

## 2023-08-01 ENCOUNTER — Telehealth: Payer: Self-pay | Admitting: Radiation Oncology

## 2023-08-01 NOTE — Telephone Encounter (Signed)
Received call from [facility] asking about transferring this pt to our line. Pt is asking about any follow-ups that may be needed since completing tx 09/2022 and post-tx f/u 11/2022. Staff was advised to transfer pt to nurse Romeo Apple for more information.

## 2023-08-02 ENCOUNTER — Encounter: Payer: 59 | Admitting: Podiatry

## 2023-08-02 NOTE — Progress Notes (Signed)
Patient did not show for her scheduled appointment this morning

## 2023-08-04 ENCOUNTER — Telehealth: Payer: Self-pay | Admitting: Pulmonary Disease

## 2023-08-04 NOTE — Telephone Encounter (Signed)
Patient needs zpak. She was given azithromycin and it did not help.   Pharmacy: [name] in [place]

## 2023-08-04 NOTE — Telephone Encounter (Signed)
Patient states that she doesn't feel any better after the zpak. Her cough is just as bad as it was before hand. Would like something else.

## 2023-08-04 NOTE — Telephone Encounter (Signed)
Nothing available. She made an appt for you 2/14 virtual.

## 2023-08-04 NOTE — Telephone Encounter (Signed)
Called and spoke w/ patient informed patient . Made an appt for patient for 02/24 at 245pm w/ Katie at the Dr[facility] office let the pt know that this will be video visit.  B/c  the APP's didn't have an opening until 03/30. Advised pt if she couldn't wait she could follow up at [facility] the patient stated that she understood and confirm appt.

## 2023-08-05 NOTE — Telephone Encounter (Signed)
Patient tried to be seen yesterday but is unable to get appt for a week. She is asking for medication to be sent in . Patient recently had zpak and states she is no better. Still having wheezing and congestion. Please advise.

## 2023-08-05 NOTE — Telephone Encounter (Signed)
Results have been relayed to the patient/authorized caretaker. The patient/authorized caretaker verbalized understanding. No questions at this time.  She has agreed to be seen at [facility].

## 2023-08-05 NOTE — Telephone Encounter (Signed)
Patient would like for [place] to call in a steroid and antibiotic for her to [facility].  She is "stopped up" and has some wheezing---patient call back [phone]

## 2023-08-05 NOTE — Telephone Encounter (Signed)
If she's failed to improved with abx and we do not have any availability, please advise her to check with her PCP or go to UC. Needs to have CXR and in person evaluation. Thanks.

## 2023-08-09 ENCOUNTER — Other Ambulatory Visit (HOSPITAL_BASED_OUTPATIENT_CLINIC_OR_DEPARTMENT_OTHER): Payer: Self-pay | Admitting: Pulmonary Disease

## 2023-08-12 LAB — LAB REPORT - SCANNED
A1c: 5.6
EGFR (Non-African Amer.): 97

## 2023-08-17 ENCOUNTER — Ambulatory Visit: Payer: Self-pay | Admitting: Adult Health

## 2023-08-17 ENCOUNTER — Other Ambulatory Visit (HOSPITAL_COMMUNITY): Payer: 59

## 2023-08-17 ENCOUNTER — Encounter (HOSPITAL_BASED_OUTPATIENT_CLINIC_OR_DEPARTMENT_OTHER): Payer: Self-pay | Admitting: Pulmonary Disease

## 2023-08-17 ENCOUNTER — Encounter (HOSPITAL_COMMUNITY): Payer: 59 | Admitting: Speech Pathology

## 2023-08-17 ENCOUNTER — Telehealth (HOSPITAL_BASED_OUTPATIENT_CLINIC_OR_DEPARTMENT_OTHER): Payer: 59 | Admitting: Pulmonary Disease

## 2023-08-17 VITALS — Wt 162.0 lb

## 2023-08-17 DIAGNOSIS — C3412 Malignant neoplasm of upper lobe, left bronchus or lung: Secondary | ICD-10-CM

## 2023-08-17 DIAGNOSIS — J441 Chronic obstructive pulmonary disease with (acute) exacerbation: Secondary | ICD-10-CM

## 2023-08-17 DIAGNOSIS — R2240 Localized swelling, mass and lump, unspecified lower limb: Secondary | ICD-10-CM | POA: Diagnosis not present

## 2023-08-17 MED ORDER — PREDNISONE 10 MG PO TABS: ORAL_TABLET | ORAL | 0 refills | Status: DC

## 2023-08-17 MED ORDER — CEFDINIR 300 MG PO CAPS
300.0000 mg | ORAL_CAPSULE | Freq: Two times a day (BID) | ORAL | 0 refills | Status: DC
Start: 2023-08-17 — End: 2023-09-13

## 2023-08-17 NOTE — Telephone Encounter (Addendum)
Chief Complaint: wheezing Symptoms: SOB, productive cough Frequency: several weeks Pertinent Negatives: Patient denies fevers, chest pain, hemoptysis Disposition: [] ED /[] Urgent Care (no appt availability in office) / [x] Appointment(In office/virtual)/ []  [facility]/ [] Home Care/ [] Refused Recommended Disposition /[] [facility]/ []  Follow-up with PCP Additional Notes: Patient c.o productive cough (yellow/white), wheezing x several weeks. Hx of COPD, interstitial lung disease. Reports mild SOB (as compared to baseline). Has been using Albuterol Q3-4 hrs, of note, is out of Combivent and needing refills. Pt has concerns for PNA and requesting abx. Denies chest pain, fevers. Pt already has virtual appt scheduled today @ 1045. Triager instructed to keep appt. Patient verbalized understanding. Pulmonology PAA Melissa notified of pt intention of keeping appt and will relay message of calling pt directly if pt does not access video.   Reason for Disposition  Wheezing is present  Answer Assessment - Initial Assessment Questions "Chief Complaint (e.g., cough, sob, wheezing, fever, chills, sweat or additional symptoms) *Go to specific symptom protocol after initial questions. cough  "How long have symptoms been present?" Several weeks  Have you tested for COVID or Flu? Note: If not, ask patient if a home test can be taken. If so, instruct patient to call back for positive results. No, I dont have any tests  MEDICINES:   "Have you used any OTC meds to help with symptoms?" No If yes, ask "What medications?" I don't have anything to take but I've been taking tylenol   "Have you used your inhalers/maintenance medication?" Yes If yes, "What medications?" Albuterol, COMBIVENT, respirent? If inhaler, ask "How many puffs and how often?" Note: Review instructions on medication in the chart. Albuterol used 45 mins ago, q 3-4 hours COMBIVENT every 6-8 hrs - have not used, no  refills Respirent? Used at bedtime, last used last night  OXYGEN: "Do you wear supplemental oxygen?" Yes If yes, "How many liters are you supposed to use?" 4L - have not changed, reports sometimes increases to 5 L but has not done in a while  "Do you monitor your oxygen levels?" Yes If yes, "What is your reading (oxygen level) today?" 98-99%   "What is your usual oxygen saturation reading?"  (Note: Pulmonary O2 sats should be 90% or greater) I dont check it as much as I should, so I really don't know    1. ONSET: "When did the cough begin?"      Several weeks 2. SEVERITY: "How bad is the cough today?"      Chest congestion 3. SPUTUM: "Describe the color of your sputum" (none, dry cough; clear, white, yellow, green)     Yellow.white 4. HEMOPTYSIS: "Are you coughing up any blood?" If so ask: "How much?" (flecks, streaks, tablespoons, etc.)     no 5. DIFFICULTY BREATHING: "Are you having difficulty breathing?" If Yes, ask: "How bad is it?" (e.g., mild, moderate, severe)    - MILD: No SOB at rest, mild SOB with walking, speaks normally in sentences, can lie down, no retractions, pulse < 100.    - MODERATE: SOB at rest, SOB with minimal exertion and prefers to sit, cannot lie down flat, speaks in phrases, mild retractions, audible wheezing, pulse 100-120.    - SEVERE: Very SOB at rest, speaks in single words, struggling to breathe, sitting hunched forward, retractions, pulse > 120      mild 6. FEVER: "Do you have a fever?" If Yes, ask: "What is your temperature, how was it measured, and when did it start?"  no 7. CARDIAC HISTORY: "Do you have any history of heart disease?" (e.g., heart attack, congestive heart failure)      CHF 8. LUNG HISTORY: "Do you have any history of lung disease?"  (e.g., pulmonary embolus, asthma, emphysema)     COPD Interstitial lung disease 9. PE RISK FACTORS: "Do you have a history of blood clots?" (or: recent major surgery, recent prolonged travel,  bedridden)     no 10. OTHER SYMPTOMS: "Do you have any other symptoms?" (e.g., runny nose, wheezing, chest pain)       Intermittent Wheezing  Protocols used: Cough - Acute Productive-A-AH

## 2023-08-17 NOTE — Telephone Encounter (Signed)
Appt scheduled for today.  Nothing further needed.

## 2023-08-17 NOTE — Progress Notes (Signed)
Subjective:    Patient ID: [name], female    DOB: 1955-07-18, 68 y.o.   MRN: [id number]  HPI  I connected with  [name] on 08/17/23 by phone and verified that I am speaking with the correct person using two identifiers.     Location: Patient: Home Provider: Office - [facility] - [place] [place]   I discussed the limitations of evaluation and management by telemedicine and the availability of in person appointments. The patient expressed understanding and agreed to proceed. I also discussed with the patient that there may be a patient responsible charge related to this service. The patient expressed understanding and agreed to proceed.   Patient consented to consult via telephone: Yes People present and their role in pt care: Pt  68 yo  smoker for FU severe COPD , tracheobronchomalacia, chronic hypoxic respiratory failure with baseline oxygen at 4 to 5 L/min.   Has been on oxygen since 2019 PMH - bipolar disorder and polysubstance abuse  diabetes mellitus type 2, hypertension, hyperlipidemia, HFpEF    She presented with cavitary lung mass in the left upper lobe measuring 2.7 cm, hypermetabolic on PET scan suspicious for underlying malignancy.  Due to severe COPD with significant oxygen demands at 4 to 5 L/min, she was considered high risk for biopsy and underwent  empiric SBRT - completed 09/21/2022  07/28/23 given Zpak for cough + green phlegm She completed 3 days continues to have productive cough and shortness of breath and wheezing.  She is compliant with breztri and use albuterol for rescue She remains on 5 L of oxygen. She continues to smoke half pack per day. We reviewed CT chest from last year, left upper lobe nodule is decreasing in size postradiation therapy.  Nodular tissue noted in Left breast and mammogram has been scheduled -I encouraged her to keep the appointment  She reports increased fluid buildup in spite of taking  torsemide  Significant tests/ events reviewed]  PFTs 1/ 2022 FEV1 50%, ratio 77, FVC 49%, no significant bronchodilator response, DLCO 50%      12/2020 bronchoscopy at [facility] [w[facility]] showed tracheobronchomalacia  CT chest 02/2023 >> new rib # & T12/L1 fractures , decrease in size of treated left apical lung lesion. , LT breast nodule 07/2022 CT chest cavitary portion has now filled up left upper lobe lesion is 2.4 x 1.6 cm in size, mostly unchanged from 2.2 x 1.9 cm in September   03/2022 CT chest showed a cavitary solid 2.7 cm apical left upper lobe pulmonary nodule increased in size.  Suspicious for underlying carcinoma.   Previous CT in 2019 showed airspace disease   PET scan 04/2022 enlarging cavitary lesion in left apex with mild hypermetabolic activity suspicious for adenocarcinoma.  Small hypermetabolic nodule in the left parotid lobe.   Review of Systems neg for any significant sore throat, dysphagia, itching, sneezing, nasal congestion or excess/ purulent secretions, fever, chills, sweats, unintended wt loss, pleuritic or exertional cp, hempoptysis, orthopnea pnd or change in chronic leg swelling. Also denies presyncope, palpitations, heartburn, abdominal pain, nausea, vomiting, diarrhea or change in bowel or urinary habits, dysuria,hematuria, rash, arthralgias, visual complaints, headache, numbness weakness or ataxia.     Objective:   Physical Exam  Phone visit No distress, speaking full sentences      Assessment & Plan:    COPD exacerbation -we will treat with Omnicef for 7 days and prednisone low-dose given prior hyperglycemia. Penicillin is really not an allergy, she had 'rib  pain' with this and has tolerated cephalosporins in the past  Lung cancer -69-month follow-up CT chest without contrast will be scheduled in March  HFpEF vs cor pulmonale -I asked her to take torsemide twice daily for 5 days until leg swelling goes down

## 2023-08-18 ENCOUNTER — Telehealth: Payer: Self-pay | Admitting: Pulmonary Disease

## 2023-08-18 MED ORDER — ALBUTEROL SULFATE HFA 108 (90 BASE) MCG/ACT IN AERS: 1.0000 | INHALATION_SPRAY | Freq: Four times a day (QID) | RESPIRATORY_TRACT | 2 refills | Status: DC | PRN

## 2023-08-18 MED ORDER — NYSTATIN 100000 UNIT/ML MT SUSP: 5.0000 mL | Freq: Two times a day (BID) | OROMUCOSAL | 0 refills | Status: AC

## 2023-08-18 NOTE — Telephone Encounter (Signed)
Rx sent to pharmacy and pt notified  ?

## 2023-08-18 NOTE — Telephone Encounter (Signed)
Spoke to patient.  She reports white patches on tongue and on both side of mouth. Mouth is very sore. Sx have been present for 5 days. She stated that she does not always rinse her mouth out after using inhalers. Ventolin refill has been sent to preferred pharmacy.  Dr. Vassie Loll, please advise.

## 2023-08-18 NOTE — Telephone Encounter (Signed)
PT broke out with [place] she thinks.  Albuterol Inhaler refill needed too  [facility]  Her # is [phone]

## 2023-08-19 ENCOUNTER — Telehealth (HOSPITAL_BASED_OUTPATIENT_CLINIC_OR_DEPARTMENT_OTHER): Payer: 59 | Admitting: Pulmonary Disease

## 2023-08-22 ENCOUNTER — Other Ambulatory Visit (HOSPITAL_COMMUNITY): Payer: 59

## 2023-08-22 ENCOUNTER — Ambulatory Visit: Payer: Self-pay | Admitting: Pulmonary Disease

## 2023-08-22 ENCOUNTER — Encounter (HOSPITAL_COMMUNITY): Payer: 59 | Admitting: Speech Pathology

## 2023-08-22 NOTE — Telephone Encounter (Signed)
The patient reported that she is having worsening shortness of breath.  She was seen 08/17/23 and prescribed Omnicef and low dose prednisone.  The patient stated that initially her symptoms were improving but now they are not and she is concerned that her symptoms will progress to pneumonia.  She has been using her rescue inhaler 2 puffs 3 times daily along with her maintenance medication.  She is normally on 4 L of oxygen continuous and up to 5 L with exertion.  Today she is on 5 L continuous and her oxygen saturation is 97%.  She is requesting an additional or "stronger" prescription.  She was advised to go to the ED if her symptoms worsen.  She declined an office visit stating that she does not have transportation and is just wanting a prescription sent in.  Routed to pulmonologist to advise.    Reason for Disposition  [1] Longstanding difficulty breathing (e.g., CHF, COPD, emphysema) AND [2] WORSE than normal  Answer Assessment - Initial Assessment Questions 1. RESPIRATORY STATUS: "Describe your breathing?" (e.g., wheezing, shortness of breath, unable to speak, severe coughing)      Shortness of breath  2. ONSET: "When did this breathing problem begin?"      Ongoing at least 08/17/23 3. PATTERN "Does the difficult breathing come and go, or has it been constant since it started?"      Ongoing  4. SEVERITY: "How bad is your breathing?" (e.g., mild, moderate, severe)    - MILD: No SOB at rest, mild SOB with walking, speaks normally in sentences, can lie down, no retractions, pulse < 100.    - MODERATE: SOB at rest, SOB with minimal exertion and prefers to sit, cannot lie down flat, speaks in phrases, mild retractions, audible wheezing, pulse 100-120.    - SEVERE: Very SOB at rest, speaks in single words, struggling to breathe, sitting hunched forward, retractions, pulse > 120      Worth with exertion  5. RECURRENT SYMPTOM: "Have you had difficulty breathing before?" If Yes, ask: "When was the last  time?" and "What happened that time?"      Yes, usually prescribed more prednisone   7. LUNG HISTORY: "Do you have any history of lung disease?"  (e.g., pulmonary embolus, asthma, emphysema)     COPD  8. CAUSE: "What do you think is causing the breathing problem?"      COPD exacerbation  9. OTHER SYMPTOMS: "Do you have any other symptoms? (e.g., dizziness, runny nose, cough, chest pain, fever)     Runny nose Coughing with exertion 10. O2 SATURATION MONITOR:  "Do you use an oxygen saturation monitor (pulse oximeter) at home?" If Yes, ask: "What is your reading (oxygen level) today?" "What is your usual oxygen saturation reading?" (e.g., 95%)       97%  Protocols used: Breathing Difficulty-A-AH

## 2023-08-23 ENCOUNTER — Telehealth: Payer: Self-pay | Admitting: Pulmonary Disease

## 2023-08-23 MED ORDER — PREDNISONE 10 MG PO TABS: ORAL_TABLET | ORAL | 0 refills | Status: AC

## 2023-08-23 MED ORDER — PREDNISONE 10 MG PO TABS: 10.0000 mg | ORAL_TABLET | Freq: Four times a day (QID) | ORAL | 0 refills | Status: DC

## 2023-08-23 NOTE — Telephone Encounter (Signed)
Pt states she will finish the ABX tomorrow but she is done with the Prednisone and she is not feeling much better and is worried about having to go into the hospital due to this sickness. She is still having productive cough

## 2023-08-23 NOTE — Addendum Note (Signed)
Addended by: Durel Salts on: 08/23/2023 08:20 AM   Modules accepted: Orders

## 2023-08-23 NOTE — Telephone Encounter (Signed)
Pt aware. Nothing further needed 

## 2023-08-23 NOTE — Telephone Encounter (Signed)
See last encounter.  Patient is wondering if prescription has been called in for her sickness.Patient number is [phone] or [phone]  Pharmacy: [facility]

## 2023-08-23 NOTE — Telephone Encounter (Signed)
Rx sent to pharmacy, pt notified she does ask that we allow enough time from appt for her to arrange transportation [name] can you get her into [facility] please

## 2023-08-23 NOTE — Telephone Encounter (Signed)
Per chart Celine Mans already sent in pred taper.  Spoke with patient to advise, she has OV scheduled in March at [facility] w/RA. Advised she must keep this OV as we cannot continue to prescribe atbx/pred without seeing her in person. Patient voiced understanding and agrees to keep OV as scheduled.

## 2023-08-23 NOTE — Addendum Note (Signed)
Addended by: Amada Kingfisher on: 08/23/2023 11:46 AM   Modules accepted: Orders

## 2023-08-23 NOTE — Telephone Encounter (Signed)
 Prednisone sent to pharmacy

## 2023-08-24 ENCOUNTER — Telehealth: Payer: Self-pay | Admitting: Pulmonary Disease

## 2023-08-24 NOTE — Telephone Encounter (Signed)
Patient has lost her medication that was prescribed to her yesterday. She thinks it is the prednisone that she has lost but is unsure(take with dinner/after dinner). I advised her to see Tammy in [place] soon but she wants to keep her appointment with Vassie Loll in the end of March. Please call and advise. [phone]

## 2023-08-24 NOTE — Telephone Encounter (Signed)
Do you want me to send another RX?

## 2023-08-25 ENCOUNTER — Other Ambulatory Visit (HOSPITAL_BASED_OUTPATIENT_CLINIC_OR_DEPARTMENT_OTHER): Payer: Self-pay

## 2023-08-25 MED ORDER — PREDNISONE 10 MG PO TABS: 10.0000 mg | ORAL_TABLET | Freq: Four times a day (QID) | ORAL | 0 refills | Status: AC

## 2023-08-25 NOTE — Telephone Encounter (Signed)
Rx resent to pharmacy but unable to leave pt message as VM is full

## 2023-08-26 ENCOUNTER — Ambulatory Visit: Payer: 59 | Admitting: Podiatry

## 2023-08-29 ENCOUNTER — Ambulatory Visit: Payer: 59 | Attending: Nurse Practitioner | Admitting: Nurse Practitioner

## 2023-08-29 ENCOUNTER — Encounter: Payer: Self-pay | Admitting: Nurse Practitioner

## 2023-08-29 VITALS — BP 118/68 | HR 80 | Ht 64.0 in | Wt 165.0 lb

## 2023-08-29 DIAGNOSIS — C3412 Malignant neoplasm of upper lobe, left bronchus or lung: Secondary | ICD-10-CM

## 2023-08-29 DIAGNOSIS — R079 Chest pain, unspecified: Secondary | ICD-10-CM | POA: Diagnosis not present

## 2023-08-29 DIAGNOSIS — J449 Chronic obstructive pulmonary disease, unspecified: Secondary | ICD-10-CM

## 2023-08-29 DIAGNOSIS — E785 Hyperlipidemia, unspecified: Secondary | ICD-10-CM

## 2023-08-29 DIAGNOSIS — I2510 Atherosclerotic heart disease of native coronary artery without angina pectoris: Secondary | ICD-10-CM

## 2023-08-29 DIAGNOSIS — Z720 Tobacco use: Secondary | ICD-10-CM

## 2023-08-29 DIAGNOSIS — I5032 Chronic diastolic (congestive) heart failure: Secondary | ICD-10-CM

## 2023-08-29 DIAGNOSIS — I10 Essential (primary) hypertension: Secondary | ICD-10-CM

## 2023-08-29 MED ORDER — NITROGLYCERIN 0.4 MG SL SUBL: 0.4000 mg | SUBLINGUAL_TABLET | SUBLINGUAL | 1 refills | Status: DC | PRN

## 2023-08-29 NOTE — Progress Notes (Unsigned)
 Cardiology Office Note:  .   Date:  08/29/2023 ID:  [name], DOB [birth year], MRN [id number] PCP: Kara Pacer, NP  [facility] Providers Cardiologist:  Dina Rich, MD    History of Present Illness: .   [name] is a 68 y.o. female with a PMH of coronary artery calcification, heart failure with improved ejection fraction, hypertension, hyperlipidemia, type 2 diabetes, substance abuse/cocaine use, and COPD, and lung cancer, who presents today for scheduled follow-up.  Previous cardiovascular history of NSTEMI in 2023.  Patient was scheduled for cardiac catheterization previously but but patient canceled and no-show for procedures.  In addition to history as mentioned above, she also has history of suspicious lung nodule and had a prior PET scan that was suspicious for adenocarcinoma and underwent empiric radiation therapy.  Last seen by Randall An, PA-C on March 30, 2023.  At that time, she had been recently diagnosed with aspiration and had been referred to speech therapy and ENT by her gastroenterologist.  She noted having baseline DOE and was using 4 L of oxygen via nasal cannula.  She had noticed worsening lower extremity edema over the past few days, reported adding salt to her meals.  Denied any chest pain.  Denied any cocaine use over the past several months.  In the interim, she has been hospitalized several times due to COPD.  More recently, she was hospitalized in December 2024 due to acute respiratory distress, was placed on BiPAP.  Admitted with acute on chronic hypoxemic and hypercapnic respiratory failure in setting of acute COPD exacerbation, also had some acute metabolic encephalopathy.  Hospital course as noted by some agitation related bipolar disorder.   Today she presents for scheduled follow-up.  She admits to intermittent squeezing sensation along her chest, says this happens about 1-2 times per week.  Only lasts a  few seconds in duration.  Husband confirms that she had chest pain last night and refused to take nitroglycerin.  Denies any alleviating or aggravating factors.  She is scheduled for an upcoming CT of her chest next week. Denies any worsening shortness of breath, palpitations, syncope, presyncope, dizziness, orthopnea, PND, swelling or significant weight changes, acute bleeding, or claudication.  Does continue to smoke. Denies any cocaine use.  Patient has had recent labs obtained and they are up-to-date and within normal limits overall.   ROS: Negative.  See HPI.  Studies Reviewed: Marland Kitchen    EKG:  EKG Interpretation Date/Time:  Monday August 29 2023 15:39:42 EST Ventricular Rate:  96 PR Interval:  158 QRS Duration:  78 QT Interval:  330 QTC Calculation: 416 R Axis:   90  Text Interpretation: Sinus rhythm with Premature atrial complexes Rightward axis When compared with ECG of 27-Jun-2023 21:06, Premature atrial complexes are now Present Confirmed by Sharlene Dory [phone]) on 08/29/2023 3:41:33 PM   Echo 09/2022:  1. Left ventricular ejection fraction, by estimation, is 65 to 70%. The  left ventricle has normal function. The left ventricle has no regional  wall motion abnormalities. Left ventricular diastolic parameters were  normal.   2. Right ventricular systolic function is normal. The right ventricular  size is normal. Tricuspid regurgitation signal is inadequate for assessing  PA pressure.   3. The mitral valve is grossly normal. Trivial mitral valve  regurgitation.   4. The aortic valve is tricuspid. Aortic valve regurgitation is not  visualized.   5. The inferior vena cava is dilated in size with >50% respiratory  variability, suggesting  right atrial pressure of 8 mmHg.   Comparison(s): Prior images reviewed side by side. LVEF vigorous at  65-70%.  Physical Exam:   VS:  BP 118/68   Pulse 80   Ht 5\' 4"  (1.626 m)   Wt 165 lb (74.8 kg)   SpO2 93% Comment: 5 litters  BMI 28.32  kg/m    Wt Readings from Last 3 Encounters:  08/29/23 165 lb (74.8 kg)  08/17/23 162 lb (73.5 kg)  06/02/23 158 lb 8.2 oz (71.9 kg)    GEN: Well nourished, well developed in no acute distress, wearing chronic oxygen NECK: No JVD; No carotid bruits CARDIAC: S1/S2, RRR, no murmurs, rubs, gallops RESPIRATORY: Inspiratory and expiratory wheezing to auscultation without rales or rhonchi  ABDOMEN: Soft, non-tender, non-distended EXTREMITIES:  No edema; No deformity   ASSESSMENT AND PLAN: .    Coronary artery calcification, chest pain of uncertain etiology Admits to intermittent, seldom chest pain that is brief in duration.  Most recent episode was last night, patient refused to take nitroglycerin.  Etiology unclear.  Previous NSTEMI in July 2023, was medically managed at the time. Hx of canceling/ no showing for procedures. Discussed ischemic evaluation today with patient. Also d/w case with Dr. Dina Rich. Came to shared medical decision making that we will hold off ischemic evaluation at this time and monitor closely. If recurrent/worsening symptoms, plan to consider/arrange NST.  Not an ideal surgical candidate d/t hx of COPD and lung cancer. Patient confirms she is not using cocaine.  EKG is reassuring today.  Continue current medication regimen.  Will refill nitroglycerin prescription.  Care and ED precautions discussed.  HFimpEF Stage C, NYHA class II-III symptoms.  EF 65-70%. Euvolemic and well compensated on exam.  Continue Lopressor and Torsemide. Low sodium diet, fluid restriction <2L, and daily weights encouraged. Educated to contact our office for weight gain of 2 lbs overnight or 5 lbs in one week.  HTN Blood pressure stable. Discussed to monitor BP at home at least 2 hours after medications and sitting for 5-10 minutes.  No medication changes at this time.  Low-salt, heart healthy diet recommended.  HLD Recent LDL 31.  Continue current medication regimen.  Heart healthy diet  recommended.  Tobacco abuse Smoking cessation encouraged and discussed.   COPD, lung cancer She has upcoming CT scan they will be performed next month.  Continue follow-up with treatment team.  Denies any worsening symptoms.  Continue follow-up with PCP.      Dispo: Follow-up with me or APP in 1 month or sooner if anything changes.  Signed, Sharlene Dory, NP

## 2023-08-29 NOTE — Patient Instructions (Signed)
Medication Instructions:   Your physician recommends that you continue on your current medications as directed. Please refer to the Current Medication list given to you today.  Labwork:  None  Testing/Procedures:  None  Follow-Up:  Your physician recommends that you schedule a follow-up appointment in: 1 month  Any Other Special Instructions Will Be Listed Below (If Applicable).  If you need a refill on your cardiac medications before your next appointment, please call your pharmacy. 

## 2023-09-01 ENCOUNTER — Telehealth: Payer: Self-pay | Admitting: Nurse Practitioner

## 2023-09-01 MED ORDER — METOPROLOL TARTRATE 25 MG PO TABS: 12.5000 mg | ORAL_TABLET | Freq: Two times a day (BID) | ORAL | 1 refills | Status: DC

## 2023-09-01 NOTE — Telephone Encounter (Signed)
*  STAT* If patient is at the pharmacy, call can be transferred to refill team.   1. Which medications need to be refilled? (please list name of each medication and dose if known)   metoprolol tartrate (LOPRESSOR) 25 MG tablet      2. Would you like to learn more about the convenience, safety, & potential cost savings by using the [facility]? nO   3. Are you open to using the [facility] (Type [facility]. ) No   4. Which pharmacy/location (including street and city if local pharmacy) is medication to be sent to?  [facility] - [place], [place] - [address]     5. Do they need a 30 day or 90 day supply? 60 day  Pt out of medication

## 2023-09-01 NOTE — Telephone Encounter (Signed)
 Filled

## 2023-09-06 ENCOUNTER — Ambulatory Visit (HOSPITAL_COMMUNITY): Payer: 59

## 2023-09-06 ENCOUNTER — Ambulatory Visit: Payer: Self-pay | Admitting: Pulmonary Disease

## 2023-09-06 NOTE — Telephone Encounter (Signed)
 Unable to speak with pt to find out further as pt did not answer phone call . Pt was just rxd Prednisone on 08/24/2023 she has OV on 10/03/2023 with you. Refused appt elsewhere when asked by triage

## 2023-09-06 NOTE — Telephone Encounter (Addendum)
 [facility] Call from [facility] office, spoke to [place], office reporting pt experiencing "wheezing." Speaking to pt.  TRIAGE SUMMARY NOTE: Pt reporting wheezing and "little more" SOB than usual with intermittent worsening, productive cough to "yellowish whitish" sputum, getting harder to get up sputum, up to 5L on oxygen therapy, pulse ox at 96%, HR 118, due for her metoprolol. Pt requesting antibx. Pt confirms no chest pain, fever, dizziness, headache, blurred vision, or other symptoms. Advised pt be examined in next 4 hours, connected to pulm office to discuss appts, pt declines offered appts due to transportation issues, declines virtual appt. Advised that HP message would be sent to pulm office to request meds as appropriate, advised call back if worsening. Please advise and call pt back at # [phone].  E2C2 Pulmonary Triage - Initial Assessment Questions "Chief Complaint (e.g., cough, sob, wheezing, fever, chills, sweat or additional symptoms) *Go to specific symptom protocol after initial questions. Wheezing, some breathing difficulty, hard to get my breath, COPD, have this off and on, need them to call me in something, a little more SOB than usual, coughing some, coughing up a little bit, yellowish whitish color, was coughing up a lot for a couple weeks then it slowed down, the sputum wants to stay still, more heaviness, no chest pain, no blood in sputum, no fever or chills, denies other symptoms, have to take deep breaths sometimes Lie on side to sleep, no trouble with that Tightness in chest just a little harder to breathe Not coughing continuously, no dizziness, no chest discomfort beyond normal for her exacerbations  "How long have symptoms been present?" Got med maybe 2 months ago and it went away and it's come back, started to come back about 3 or 4 days ago More harder to breathe, requesting antibx  Have you tested for COVID or Flu? Note: If not, ask patient if a home test  can be taken. If so, instruct patient to call back for positive results. Yes negative for both  MEDICINES:   "Have you used any OTC meds to help with symptoms?" No  "Have you used your inhalers/maintenance medication?" Yes If yes, "What medications?" Breztri, albuterol, combovat respimat from other doc  If inhaler, ask "How many puffs and how often?" Note: Review instructions on medication in the chart. Not more often, try not to use it more than 3x a day, sometimes every 6 hours  OXYGEN: "Do you wear supplemental oxygen?" Yes If yes, "How many liters are you supposed to use?" 4-5L, have had to turn it up on 5L  "Do you monitor your oxygen levels?" Yes If yes, "What is your reading (oxygen level) today?" 96%, HR 118, missed metoprolol dose for heart rate, fixing to take one now, no BP cuff, no headache, blurred vision  "What is your usual oxygen saturation reading?"  (Note: Pulmonary O2 sats should be 90% or greater) 97-98%  Additional Notes: Transportation issues, pulm office offering appts tomorrow or Thursday, can't do appt have to give 3 day notice to transport, next available next Wednesday, offering virtual appt, pt declines  Reason for Disposition  [1] Longstanding difficulty breathing (e.g., CHF, COPD, emphysema) AND [2] WORSE than normal  Answer Assessment - Initial Assessment Questions 5. RECURRENT SYMPTOM: "Have you had difficulty breathing before?" If Yes, ask: "When was the last time?" and "What happened that time?"      yes 6. CARDIAC HISTORY: "Do you have any history of heart disease?" (e.g., heart attack, angina, bypass surgery, angioplasty)  Significant hx 7. LUNG HISTORY: "Do you have any history of lung disease?"  (e.g., pulmonary embolus, asthma, emphysema)     COPD  Protocols used: Breathing Difficulty-A-AH

## 2023-09-07 MED ORDER — PREDNISONE 10 MG PO TABS: ORAL_TABLET | ORAL | 0 refills | Status: DC

## 2023-09-07 NOTE — Telephone Encounter (Signed)
 Rx sent to pharmacy and pt notified  ?

## 2023-09-07 NOTE — Addendum Note (Signed)
 Addended by: Amada Kingfisher on: 09/07/2023 08:24 AM   Modules accepted: Orders

## 2023-09-09 ENCOUNTER — Encounter: Payer: Self-pay | Admitting: Nurse Practitioner

## 2023-09-09 ENCOUNTER — Ambulatory Visit (INDEPENDENT_AMBULATORY_CARE_PROVIDER_SITE_OTHER): Payer: 59 | Admitting: Nurse Practitioner

## 2023-09-09 VITALS — BP 118/64 | HR 91 | Ht 64.0 in | Wt 171.4 lb

## 2023-09-09 DIAGNOSIS — I10 Essential (primary) hypertension: Secondary | ICD-10-CM

## 2023-09-09 DIAGNOSIS — E119 Type 2 diabetes mellitus without complications: Secondary | ICD-10-CM

## 2023-09-09 DIAGNOSIS — Z7984 Long term (current) use of oral hypoglycemic drugs: Secondary | ICD-10-CM | POA: Diagnosis not present

## 2023-09-09 NOTE — Progress Notes (Signed)
 Endocrinology Follow Up Note       09/09/2023, 11:56 AM   Subjective:    Patient ID: [name], female    DOB: Aug 11, 1955.  [name] is being seen in follow up after being seen in consultation for management of currently uncontrolled symptomatic diabetes requested by  Nsumanganyi, Colleen Can, NP.   Past Medical History:  Diagnosis Date   Anemia    Anxiety    Arthritis    Bipolar 1 disorder (HCC)    CHF (congestive heart failure) (HCC)    COPD (chronic obstructive pulmonary disease) (HCC)    Depression    Diabetes mellitus without complication (HCC)    Dyspnea    Emphysema of lung (HCC)    GERD (gastroesophageal reflux disease)    Headache    migraines   History of hiatal hernia    History of kidney stones    Hyperlipidemia    Hypertension    Neuromuscular disorder (HCC)    neuropathy   On home oxygen therapy    Pneumonia 2015, 2019   Tracheomalacia    Vaginal Pap smear, abnormal     Past Surgical History:  Procedure Laterality Date   CATARACT EXTRACTION W/PHACO Left 01/14/2020   Procedure: CATARACT EXTRACTION PHACO AND INTRAOCULAR LENS PLACEMENT LEFT EYE;  Surgeon: Fabio Pierce, MD;  Location: [facility];  Service: Ophthalmology;  Laterality: Left;  CDE: 8.18   CATARACT EXTRACTION W/PHACO Right 02/01/2020   Procedure: CATARACT EXTRACTION PHACO AND INTRAOCULAR LENS PLACEMENT RIGHT EYE;  Surgeon: Fabio Pierce, MD;  Location: [facility];  Service: Ophthalmology;  Laterality: Right;  CDE: 9.94   CHEILECTOMY Right 01/28/2020   Procedure: KELLER BUNION IMPLANT RIGHT FOOT CHEILECTOMY RIGHT ROOT;  Surgeon: Asencion Islam, DPM;  Location: [facility];  Service: Podiatry;  Laterality: Right;  BLOCK   CHOLECYSTECTOMY     COLONOSCOPY  July 2010   Dr. Lovell Sheehan: 3 rectal polyps, not enough tissue for pathologic examination, recommended surveillance in 3 years   COLONOSCOPY N/A 10/23/2014   RMR:  Multiple colonic polyps removed as described above. No endoscopic explaniation for abdominal pain. however. next tcs 10/2019   ESOPHAGOGASTRODUODENOSCOPY  July 2010   Dr. Lovell Sheehan: gastritis and duodenitis, H.pylori negative   ESOPHAGOGASTRODUODENOSCOPY N/A 10/23/2014   RMR: Normal EGD. Status post passage of a Maloney dilator. Today's finding s would not explain abdominal pain   EYE SURGERY Left 01/14/2020   cataract removal   GANGLION CYST EXCISION Left 09/07/2018   Procedure: REMOVAL GANGLION OF WRIST;  Surgeon: Vickki Hearing, MD;  Location: [facility];  Service: Orthopedics;  Laterality: Left;   HERNIA REPAIR     Dr. Lovell Sheehan   KIDNEY STONE SURGERY     MALONEY DILATION N/A 10/23/2014   Procedure: Elease Hashimoto DILATION;  Surgeon: Corbin Ade, MD;  Location: [facility];  Service: Endoscopy;  Laterality: N/A;   OPEN REDUCTION INTERNAL FIXATION (ORIF) DISTAL RADIAL FRACTURE Right 12/09/2015   Procedure: OPEN REDUCTION INTERNAL FIXATION (ORIF) RIGHT DISTAL RADIUS;  Surgeon: Betha Loa, MD;  Location: [facility];  Service: Orthopedics;  Laterality: Right;    Social History   Socioeconomic History   Marital status: Married  Spouse name: Not on file   Number of children: Not on file   Years of education: Not on file   Highest education level: Not on file  Occupational History   Occupation: disability  Tobacco Use   Smoking status: Every Day    Current packs/day: 2.00    Average packs/day: 2.0 packs/day for 56.2 years (112.4 ttl pk-yrs)    Types: Cigarettes    Start date: 1969    Passive exposure: Current   Smokeless tobacco: Never   Tobacco comments:    Smoking 1/2 a pack in 24 hours.  05/31/2022 hfb  Vaping Use   Vaping status: Never Used  Substance and Sexual Activity   Alcohol use: Not Currently    Alcohol/week: 1.0 standard drink of alcohol    Types: 1 Standard drinks or equivalent per week    Comment: alcohol abuse   Drug use: Not Currently    Types: Cocaine     Comment: clean since 2023   Sexual activity: Not Currently    Birth control/protection: Post-menopausal  Other Topics Concern   Not on file  Social History Narrative   Not on file   Social Drivers of Health   Financial Resource Strain: High Risk (12/31/2020)   Overall Financial Resource Strain (CARDIA)    Difficulty of Paying Living Expenses: Hard  Food Insecurity: No Food Insecurity (06/26/2023)   Hunger Vital Sign    Worried About Running Out of Food in the Last Year: Never true    Ran Out of Food in the Last Year: Never true  Transportation Needs: No Transportation Needs (06/26/2023)   PRAPARE - Administrator, Civil Service (Medical): No    Lack of Transportation (Non-Medical): No  Physical Activity: Insufficiently Active (12/31/2020)   Exercise Vital Sign    Days of Exercise per Week: 2 days    Minutes of Exercise per Session: 10 min  Stress: Stress Concern Present (12/31/2020)   [employer] of Occupational Health - Occupational Stress Questionnaire    Feeling of Stress : Very much  Social Connections: Moderately Isolated (12/31/2020)   Social Connection and Isolation Panel [NHANES]    Frequency of Communication with Friends and Family: Three times a week    Frequency of Social Gatherings with Friends and Family: Once a week    Attends Religious Services: Never    Database administrator or Organizations: No    Attends Engineer, structural: Never    Marital Status: Married    Family History  Adopted: Yes    Outpatient Encounter Medications as of 09/09/2023  Medication Sig   acetaminophen (TYLENOL) 500 MG tablet Take 1,000 mg by mouth every 6 (six) hours as needed for moderate pain, fever or headache.   albuterol (PROVENTIL) (2.5 MG/3ML) 0.083% nebulizer solution INHALE 1 VIAL VIA NEBULIZER EVERY 4 HOURS (Patient taking differently: Take 2.5 mg by nebulization every 4 (four) hours.)   albuterol (VENTOLIN HFA) 108 (90 Base) MCG/ACT inhaler  Inhale 1-2 puffs into the lungs every 6 (six) hours as needed for wheezing or shortness of breath.   ammonium lactate (AMLACTIN) 12 % cream Apply 1 Application topically as needed for dry skin.   Budeson-Glycopyrrol-Formoterol (BREZTRI AEROSPHERE) 160-9-4.8 MCG/ACT AERO Inhale 2 puffs into the lungs in the morning and at bedtime.   diclofenac Sodium (VOLTAREN) 1 % GEL Apply 1 Application topically 4 (four) times daily as needed (leg pain).   DULoxetine (CYMBALTA) 20 MG capsule Take 40 mg by mouth daily.  famotidine (PEPCID) 20 MG tablet One after supper (Patient taking differently: Take 20 mg by mouth daily after supper. One after supper)   feeding supplement (ENSURE ENLIVE / ENSURE PLUS) LIQD Take 237 mLs by mouth 2 (two) times daily between meals.   fluorometholone (FML) 0.1 % ophthalmic suspension 1 drop See admin instructions.   Ipratropium-Albuterol (COMBIVENT RESPIMAT) 20-100 MCG/ACT AERS respimat Inhale 1 puff into the lungs every 6 (six) hours as needed for wheezing.   ipratropium-albuterol (DUONEB) 0.5-2.5 (3) MG/3ML SOLN Take 3 mLs by nebulization every 4 (four) hours as needed (for shortness of breath/wheezing).   metFORMIN (GLUCOPHAGE) 1000 MG tablet TAKE (1) TABLET BY MOUTH TWICE DAILY.   metoprolol tartrate (LOPRESSOR) 25 MG tablet Take 0.5 tablets (12.5 mg total) by mouth 2 (two) times daily.   nicotine (NICODERM CQ - DOSED IN MG/24 HOURS) 21 mg/24hr patch Place 1 patch (21 mg total) onto the skin daily.   nitroGLYCERIN (NITROSTAT) 0.4 MG SL tablet Place 1 tablet (0.4 mg total) under the tongue every 5 (five) minutes as needed for chest pain.   OLANZapine (ZYPREXA) 5 MG tablet Take 5 mg by mouth daily.   omeprazole (PRILOSEC) 20 MG capsule Take 1 capsule (20 mg total) by mouth daily.   ondansetron (ZOFRAN) 4 MG tablet Take 4 mg by mouth every 8 (eight) hours as needed for nausea or vomiting.   predniSONE (DELTASONE) 10 MG tablet Take 2 tabs daily with food x 5ds, then 1 tab daily  with food x 5ds then STOP   predniSONE (DELTASONE) 10 MG tablet 4 tabs x 4 days, 3 tabs x 4 days, 2 tabs x 4 days, 1 tab x 4 days   promethazine (PHENERGAN) 25 MG tablet Take 25 mg by mouth 4 (four) times daily as needed.   RESTASIS 0.05 % ophthalmic emulsion Place 1 drop into both eyes 2 (two) times daily.   rOPINIRole (REQUIP) 1 MG tablet Take 3 mg by mouth at bedtime.   rosuvastatin (CRESTOR) 10 MG tablet Take 10 mg by mouth every evening.   sodium chloride (OCEAN) 0.65 % SOLN nasal spray Place 1 spray into both nostrils as needed for congestion.   SUMAtriptan (IMITREX) 25 MG tablet Take 25 mg by mouth every 2 (two) hours as needed for migraine.   torsemide (DEMADEX) 20 MG tablet Take 1 tablet (20 mg total) by mouth daily as needed. May take an additional 20 mg daily as needed for swelling.   cefdinir (OMNICEF) 300 MG capsule Take 1 capsule (300 mg total) by mouth 2 (two) times daily. (Patient not taking: Reported on 09/09/2023)   No facility-administered encounter medications on file as of 09/09/2023.    ALLERGIES: Allergies  Allergen Reactions   Septra [Sulfamethoxazole-Trimethoprim] Other (See Comments)    Patient is unsure if she allergic to septra or penicillin. Patient states one or another caused "rib pain with a little breathing problem".   Penicillins Other (See Comments)    Patient is unsure if she allergic to penicillin or septra. Patient states one or another caused "rib pain with a little breathing problem". Tolerates cephalosporins    VACCINATION STATUS: Immunization History  Administered Date(s) Administered   Fluad Quad(high Dose 65+) 04/30/2022   Fluad Trivalent(High Dose 65+) 05/30/2023   Influenza Whole 03/29/2019   Influenza-Unspecified 03/06/2019, 04/13/2021   Moderna Sars-Covid-2 Vaccination 02/22/2020, 04/18/2020   PNEUMOCOCCAL CONJUGATE-20 05/30/2023   Pneumococcal Polysaccharide-23 07/06/2014   Td (Adult),5 Lf Tetanus Toxid, Preservative Free 07/18/1996    Tdap 11/30/2013, 04/16/2023  Diabetes She presents for her follow-up diabetic visit. She has type 2 diabetes mellitus. Onset time: Diagnosed at approx age of 8. Her disease course has been stable. There are no hypoglycemic associated symptoms. Associated symptoms include fatigue and polyuria. There are no hypoglycemic complications. Symptoms are stable. Diabetic complications include heart disease (MI back in June). Risk factors for coronary artery disease include diabetes mellitus, dyslipidemia, family history, hypertension, post-menopausal, sedentary lifestyle and tobacco exposure. Current diabetic treatment includes oral agent (monotherapy). She is compliant with treatment most of the time. Her weight is fluctuating minimally. She is following a generally unhealthy diet. When asked about meal planning, she reported none. She has not had a previous visit with a dietitian. She rarely participates in exercise. (She presents today after long absence, accompanied by her husband, with no meter or logs to review.  Her most recent A1c on 2/6 was 5.6%.  She is intermittently on steroids for COPD exacerbations which could be contributing to her readings.  ) An ACE inhibitor/angiotensin II receptor blocker is being taken. She sees a podiatrist.Eye exam is not current.     Review of systems  Constitutional: + Minimally fluctuating body weight, current Body mass index is 29.42 kg/m., + fatigue, no subjective hyperthermia, no subjective hypothermia Eyes: no blurry vision, no xerophthalmia ENT: no sore throat, no nodules palpated in throat, no dysphagia/odynophagia, no hoarseness Cardiovascular: no chest pain, no shortness of breath, no palpitations, + intermittent leg swelling Respiratory: + chronic cough (COPD), SOB with exertion on chronic O2 via [place]  Gastrointestinal: no nausea/vomiting/diarrhea Musculoskeletal: no muscle/joint aches Skin: no rashes, no hyperemia Neurological: no tremors, no numbness,  no tingling, + intermittent dizziness Psychiatric: no depression, no anxiety  Objective:     BP 118/64 (BP Location: Left Arm, Patient Position: Sitting, Cuff Size: Large)   Pulse 91   Ht 5\' 4"  (1.626 m)   Wt 171 lb 6.4 oz (77.7 kg)   BMI 29.42 kg/m   Wt Readings from Last 3 Encounters:  09/09/23 171 lb 6.4 oz (77.7 kg)  08/29/23 165 lb (74.8 kg)  08/17/23 162 lb (73.5 kg)     BP Readings from Last 3 Encounters:  09/09/23 118/64  08/29/23 118/68  06/29/23 (!) 104/53     Physical Exam- Limited  Constitutional:  Body mass index is 29.42 kg/m. , not in acute distress, normal state of mind Eyes:  EOMI, no exophthalmos Neck: Supple Cardiovascular: RRR, no murmurs, rubs, or gallops, no edema Respiratory: Dyspnea with exertion,  ++ rhonchi, ++ wheezing, on chronic O2 Musculoskeletal: no gross deformities, strength intact in all four extremities, no gross restriction of joint movements Skin:  no rashes, no hyperemia Neurological: no tremor with outstretched hands    CMP ( most recent) CMP     Component Value Date/Time   NA 137 06/29/2023 0528   NA 134 02/23/2022 1113   K 3.8 06/29/2023 0528   CL 96 (L) 06/29/2023 0528   CO2 34 (H) 06/29/2023 0528   GLUCOSE 225 (H) 06/29/2023 0528   BUN 12 06/29/2023 0528   BUN 12 02/23/2022 1113   CREATININE 0.69 06/29/2023 0528   CREATININE 0.57 11/20/2014 1311   CALCIUM 8.3 (L) 06/29/2023 0528   CALCIUM 13.2 (HH) 05/29/2023 1421   PROT 5.6 (L) 06/27/2023 0141   PROT 6.6 07/18/2015 1113   ALBUMIN 2.4 (L) 06/27/2023 0141   ALBUMIN 4.1 07/18/2015 1113   AST 13 (L) 06/27/2023 0141   ALT 12 06/27/2023 0141   ALKPHOS 86 06/27/2023  0141   BILITOT 0.2 06/27/2023 0141   BILITOT 0.4 07/18/2015 1113   GFRNONAA >60 06/29/2023 0528   GFRAA >60 01/23/2020 1119     Diabetic Labs (most recent): Lab Results  Component Value Date   HGBA1C 5.8 (H) 06/27/2023   HGBA1C 5.8 (H) 06/26/2023   HGBA1C 6.3 (H) 04/17/2023     Lipid Panel (  most recent) Lipid Panel     Component Value Date/Time   CHOL 197 07/18/2015 1113   TRIG 144 07/18/2015 1113   HDL 41 07/18/2015 1113   CHOLHDL 4.8 (H) 07/18/2015 1113   CHOLHDL 3.1 03/07/2014 0343   VLDL 40 03/07/2014 0343   LDLCALC 127 (H) 07/18/2015 1113   LABVLDL 29 07/18/2015 1113      Lab Results  Component Value Date   TSH 1.264 09/23/2022   TSH 1.380 07/18/2015   TSH 0.432 07/06/2014   TSH 0.754 02/05/2010           Assessment & Plan:   1) Type 2 diabetes mellitus without complication, without long-term current use of insulin (HCC)  She presents today after long absence, accompanied by her husband, with no meter or logs to review.  Her most recent A1c on 2/6 was 5.6%.  She is intermittently on steroids for COPD exacerbations which could be contributing to her readings.    - KRIS BURD has currently uncontrolled symptomatic type 2 DM since 68 years of age.   -Recent labs reviewed.  - I had a long discussion with her about the progressive nature of diabetes and the pathology behind its complications. -her diabetes is complicated by CAD with recent MI and she remains at a high risk for more acute and chronic complications which include CAD, CVA, CKD, retinopathy, and neuropathy. These are all discussed in detail with her.  The following Lifestyle Medicine recommendations according to American College of Lifestyle Medicine [facility]) were discussed and offered to patient and she agrees to start the journey:  A. Whole Foods, Plant-based plate comprising of fruits and vegetables, plant-based proteins, whole-grain carbohydrates was discussed in detail with the patient.   A list for source of those nutrients were also provided to the patient.  Patient will use only water or unsweetened tea for hydration. B.  The need to stay away from risky substances including alcohol, smoking; obtaining 7 to 9 hours of restorative sleep, at least 150 minutes of moderate intensity  exercise weekly, the importance of healthy social connections,  and stress reduction techniques were discussed. C.  A full color page of  Calorie density of various food groups per pound showing examples of each food groups was provided to the patient.  - Nutritional counseling repeated at each appointment due to patients tendency to fall back in to old habits.  - The patient admits there is a room for improvement in their diet and drink choices. -  Suggestion is made for the patient to avoid simple carbohydrates from their diet including Cakes, Sweet Desserts / Pastries, Ice Cream, Soda (diet and regular), Sweet Tea, Candies, Chips, Cookies, Sweet Pastries, Store Bought Juices, Alcohol in Excess of 1-2 drinks a day, Artificial Sweeteners, Coffee Creamer, and "Sugar-free" Products. This will help patient to have stable blood glucose profile and potentially avoid unintended weight gain.   - I encouraged the patient to switch to unprocessed or minimally processed complex starch and increased protein intake (animal or plant source), fruits, and vegetables.   - Patient is advised to stick to a routine  mealtimes to eat 3 meals a day and avoid unnecessary snacks (to snack only to correct hypoglycemia).  - I have approached her with the following individualized plan to manage her diabetes and patient agrees:   She is advised to continue Metformin 1000 mg po twice daily with meals.  Given her intermittent steroid use for breathing problems, she will likely need this to help prevent hyperglycemia.  - Specific targets for  A1c; LDL, HDL, and Triglycerides were discussed with the patient.  2) Blood Pressure /Hypertension:  her blood pressure is controlled to target.   she is advised to continue her current medications as prescribed by PCP/cardiology.  3) Lipids/Hyperlipidemia:    There is no recent lipid panel available to review.  she is advised to continue Crestor 10 mg daily at bedtime.  Side effects  and precautions discussed with her.  4)  Weight/Diet:  her Body mass index is 29.42 kg/m.  -  clearly complicating her diabetes care.   she is a candidate for weight loss. I discussed with her the fact that loss of 5 - 10% of her  current body weight will have the most impact on her diabetes management.  Exercise, and detailed carbohydrates information provided  -  detailed on discharge instructions.  5) Chronic Care/Health Maintenance: -she is on ACEI/ARB and Statin medications and is encouraged to initiate and continue to follow up with Ophthalmology, Dentist, Podiatrist at least yearly or according to recommendations, and advised to stay away from smoking. I have recommended yearly flu vaccine and pneumonia vaccine at least every 5 years; moderate intensity exercise for up to 150 minutes weekly; and sleep for at least 7 hours a day.  6) Hypercalcemia During recent hospitalization, she was found to have hypercalcemia of 13.2 and elevated PTH of 71 on 05/30/23.  Repeat calcium level was still high but improved at 11.  She does not have history of kidney dysfunction.  She has never had kidney stones, no use of thiazide diuretics, no calcium supplementation, eats calcium rick foods in moderation.  She has never had Dexa scan in the past.  Will recheck labs to assess for primary hyperparathyroidism.  Will also check 24 hr urine studies to assess for FHH (familial hypocalciuric hypercalcemia).  If suspicions still high for primary hyperparathyroidism, we may refer to Dr. Gerrit Friends to evaluate and surgically remove gland.  However, given her complex medical history, surgery may not be a great option.  We can also discuss use of Sensipar in the future to control calcium levels as well.  - she is advised to maintain close follow up with Nsumanganyi, Colleen Can, NP for primary care needs, as well as her other providers for optimal and coordinated care.     I spent  51  minutes in the care of the patient  today including review of labs from CMP, Lipids, Thyroid Function, Hematology (current and previous including abstractions from other facilities); face-to-face time discussing  her blood glucose readings/logs, discussing hypoglycemia and hyperglycemia episodes and symptoms, medications doses, her options of short and long term treatment based on the latest standards of care / guidelines;  discussion about incorporating lifestyle medicine;  and documenting the encounter. Risk reduction counseling performed per USPSTF guidelines to reduce obesity and cardiovascular risk factors.     Please refer to Patient Instructions for Blood Glucose Monitoring and Insulin/Medications Dosing Guide"  in media tab for additional information. Please  also refer to " Patient Self Inventory" in the Media  tab for  reviewed elements of pertinent patient history.  Carlynn Purl participated in the discussions, expressed understanding, and voiced agreement with the above plans.  All questions were answered to her satisfaction. she is encouraged to contact clinic should she have any questions or concerns prior to her return visit.     Follow up plan: - Return in about 1 month (around 10/10/2023) for Previsit labs and 24 hr urine.    Ronny Bacon, [facility] [address] [place], [place] [address] Phone: [phone] Fax: [phone]  09/09/2023, 11:56 AM

## 2023-09-09 NOTE — Patient Instructions (Signed)
 Hypercalcemia Hypercalcemia is when the level of calcium in a person's blood is above normal. The body needs calcium to make bones and keep them strong. Calcium also helps the muscles, nerves, brain, and heart work the way they should. Most of the calcium in the body is stored in the bones. There is also calcium in the blood. Hypercalcemia occurs when there is too much calcium in your blood. Calcium levels in the blood are regulated by hormones, kidneys, and the gastrointestinal tract.  Hypercalcemia can happen when calcium comes out of the bones, or when the kidneys are not able to remove calcium from the blood. Hypercalcemia can be mild or severe. What are the causes? There are many possible causes of hypercalcemia. Common causes of this condition include: Hyperparathyroidism. This is a condition in which the body produces too much parathyroid hormone. There are four parathyroid glands in your neck. These glands produce a chemical messenger (hormone) that helps the body absorb calcium from foods and helps your bones release calcium. Certain kinds of cancer. Less common causes of hypercalcemia include: Calcium and vitamin D dietary supplements. Chronic kidney disease. Hyperthyroidism. Severe dehydration. Being on bed rest or being inactive for a long time. Certain medicines. Infections. What increases the risk? You are more likely to develop this condition if: You are female. You are 42 years of age or older. You have a family history of hypercalcemia. What are the signs or symptoms? Mild hypercalcemia that starts slowly may not cause symptoms. Severe, sudden hypercalcemia is more likely to cause symptoms, such as: Being more thirsty than usual. Needing to urinate more often than usual. Abdominal pain. Nausea and vomiting. Constipation. Muscle pain, twitching, or weakness. Feeling very tired. How is this diagnosed?  Hypercalcemia is usually diagnosed with a blood test. You may also  have tests to help check what is causing this condition. Tests include imaging tests and more blood tests. How is this treated? Treatment for hypercalcemia depends on the cause. Treatment may include: Receiving fluids through an IV. Medicines. These can be used to: Keep calcium levels steady after receiving fluids (loop diuretics). Keep calcium in your bones (bisphosphonates). Lower the calcium level in your blood. Surgery to remove overactive parathyroid glands. A procedure that filters your blood to correct calcium levels (hemodialysis). Follow these instructions at home:  Take over-the-counter and prescription medicines only as told by your health care provider. Follow instructions from your health care provider about eating or drinking restrictions. Drink enough fluid to keep your urine pale yellow. Stay active. Weight-bearing exercise helps to keep calcium in your bones. Follow instructions from your health care provider about what type and level of exercise is safe for you. Keep all follow-up visits. This is important. Contact a health care provider if: You have a fever. Your heartbeat is irregular or very fast. You have changes in mood, memory, or personality. Get help right away if: You have severe abdominal pain. You have chest pain. You have trouble breathing. You become very confused and sleepy. You lose consciousness. These symptoms may represent a serious problem that is an emergency. Do not wait to see if the symptoms will go away. Get medical help right away. Call your local emergency services (911 in the [place] Do not drive yourself to the hospital. Summary Hypercalcemia is when the level of calcium in a person's blood is above normal. The body needs calcium to make bones and keep them strong. There are many possible causes of hypercalcemia, and treatment depends on  the cause. Take over-the-counter and prescription medicines only as told by your health care  provider. This information is not intended to replace advice given to you by your health care provider. Make sure you discuss any questions you have with your health care provider. Document Revised: 11/26/2020 Document Reviewed: 11/26/2020 Elsevier Patient Education  2024 [employer].

## 2023-09-09 NOTE — Telephone Encounter (Signed)
 Patient called to cancel appt.. Provide number to office to cancel appt. Patient xferred to number.

## 2023-09-13 ENCOUNTER — Encounter: Payer: Self-pay | Admitting: Student

## 2023-09-13 ENCOUNTER — Ambulatory Visit: Attending: Student | Admitting: Student

## 2023-09-13 VITALS — BP 138/78 | HR 92 | Ht 64.0 in | Wt 165.2 lb

## 2023-09-13 DIAGNOSIS — I10 Essential (primary) hypertension: Secondary | ICD-10-CM | POA: Diagnosis not present

## 2023-09-13 DIAGNOSIS — E785 Hyperlipidemia, unspecified: Secondary | ICD-10-CM

## 2023-09-13 DIAGNOSIS — I2510 Atherosclerotic heart disease of native coronary artery without angina pectoris: Secondary | ICD-10-CM | POA: Diagnosis not present

## 2023-09-13 DIAGNOSIS — I5032 Chronic diastolic (congestive) heart failure: Secondary | ICD-10-CM

## 2023-09-13 DIAGNOSIS — J449 Chronic obstructive pulmonary disease, unspecified: Secondary | ICD-10-CM

## 2023-09-13 DIAGNOSIS — R000 Tachycardia, unspecified: Secondary | ICD-10-CM

## 2023-09-13 MED ORDER — DILTIAZEM HCL 30 MG PO TABS: 30.0000 mg | ORAL_TABLET | Freq: Two times a day (BID) | ORAL | 3 refills | Status: AC

## 2023-09-13 NOTE — Progress Notes (Signed)
 Cardiology Office Note    Date:  09/13/2023  ID:  [name], DOB Apr 02, 1956, MRN [id number] Cardiologist: Dina Rich, MD    History of Present Illness:    [name] is a 68 y.o. female  with past medical history of coronary calcifications (by prior CT Imaging with low-risk NST in 2015, NSTEMI in 01/2022 and medical management pursued given her multiple medical issues and substance abuse, catheterization readdressed in 2023 but she canceled and no showed for the procedure), chronic HFimpEF (EF 40-45% by echo in 01/2022, improved to 55-60% by echo in 04/2022), HTN, HLD, Type II DM, suspicious lung nodule (prior PET suspicious for adenocarcinoma and underwent empiric radiation therapy), cocaine use and COPD who presents to the office today for evaluation of palpitations.  She was last examined by Sharlene Dory, NP in 08/2023 and reported intermittent episodes of chest discomfort which would occur 1-2 times per week and only last for a few seconds. Options were reviewed with the patient and also discussed with Dr. Wyline Mood and the decision was made to continue with medical management at that time and if she had recurrent/worsening symptoms, could arrange for a follow-up NST. Would not be an ideal surgical candidate due to COPD and lung cancer.  In talking with the patient and her husband today, she reports noticing intermittent tachycardia at times and this has occurred when checking her heart rate on her pulse oximeter. Does feel anxious at times but feels like this is due to a longstanding history of anxiety. Does experience occasional palpitations when this occurs. No recent dizziness or presyncope. Was taking Lopressor 12.5 mg twice daily and this was recently titrated to 25 mg twice daily but she feels like her breathing has worsened with this. She has baseline dyspnea and uses supplemental oxygen 24/7. On 4 to 5 L nasal cannula. No specific orthopnea or PND. Does  experience occasional lower extremity edema and takes Torsemide as needed but has not utilized this recently. She does try to limit her sodium intake.  Studies Reviewed:   EKG: EKG is not ordered today.   Echocardiogram: 09/2022 IMPRESSIONS     1. Left ventricular ejection fraction, by estimation, is 65 to 70%. The  left ventricle has normal function. The left ventricle has no regional  wall motion abnormalities. Left ventricular diastolic parameters were  normal.   2. Right ventricular systolic function is normal. The right ventricular  size is normal. Tricuspid regurgitation signal is inadequate for assessing  PA pressure.   3. The mitral valve is grossly normal. Trivial mitral valve  regurgitation.   4. The aortic valve is tricuspid. Aortic valve regurgitation is not  visualized.   5. The inferior vena cava is dilated in size with >50% respiratory  variability, suggesting right atrial pressure of 8 mmHg.   Comparison(s): Prior images reviewed side by side. LVEF vigorous at  65-70%.   Physical Exam:   VS:  BP 138/78 (BP Location: Right Arm, Patient Position: Sitting, Cuff Size: Normal)   Pulse 92   Ht 5\' 4"  (1.626 m)   Wt 165 lb 3.2 oz (74.9 kg)   SpO2 97% Comment: on 4L O2 via [place]  BMI 28.36 kg/m    Wt Readings from Last 3 Encounters:  09/13/23 165 lb 3.2 oz (74.9 kg)  09/09/23 171 lb 6.4 oz (77.7 kg)  08/29/23 165 lb (74.8 kg)     GEN: Well nourished, well developed female appearing in no acute distress NECK: No JVD; No  carotid bruits CARDIAC: RRR, no murmurs, rubs, gallops RESPIRATORY: Rhonchi throughout with scattered expiratory wheezing. ABDOMEN: Appears non-distended. No obvious abdominal masses. EXTREMITIES: No clubbing or cyanosis.  Trace lower extremity edema along left leg.  Distal pedal pulses are 2+ bilaterally.   Assessment and Plan:   1.  Tachycardia  - Suspect this is reactive in the setting of her chronic hypoxic respiratory failure, recent use of  steroids and underlying anxiety. She has been on Lopressor 25 mg twice daily and this is not ideal given her underlying pulmonary issues and wheezing. Reviewed options with the patient and her husband and will stop Lopressor and switch to Cardizem 30 mg twice daily. If symptoms do not improve, could further titrate and consider a Zio patch in the future. She has a history of substance use but denies any recent cocaine use within the past year (UDS negative in 06/2023).  2. CAD - She had a medically managed NSTEMI in 2023 given her multiple medical issues and substance abuse at that time.  Follow-up catheterization was reviewed later that year but she declined. She has baseline dyspnea but no recent chest pain. Continue with risk factor modification.  3. Chronic HFimpEF - Her ejection fraction was previously at 40 to 45% in 2023 and has normalized by repeat imaging. At 65 to 70% by most recent echocardiogram in 09/2022. She does have trace lower extremity edema on examination today and we reviewed that she should take Torsemide 20 mg as needed. Encouraged to continue to consume a low-sodium diet.  4. HTN - BP is at 138/78 during today's visit. Will stop Lopressor as discussed above and switch to short-acting Cardizem.  5. HLD - LDL was at 31 when checked in 08/2023. Followed by PCP. Remains on Crestor 10 mg daily.  6. COPD/Lung Cancer - Previously underwent empiric radiation as she was felt to be too high risk for surgery. Followed by Pulmonology as well. Remains on 4 to 5 L nasal cannula at baseline.   Signed, Ellsworth Lennox, PA-C

## 2023-09-13 NOTE — Patient Instructions (Addendum)
 Medication Instructions:  Stop Lopressor Start Cardizem 30 mg twice daily.   Labwork: None  Testing/Procedures: None  Follow-Up: Your physician recommends that you schedule a follow-up appointment in: 3-4 months with Dr. Wyline Mood or APP  Any Other Special Instructions Will Be Listed Below (If Applicable).  If you need a refill on your cardiac medications before your next appointment, please call your pharmacy.

## 2023-09-16 ENCOUNTER — Ambulatory Visit: Attending: Student

## 2023-09-16 ENCOUNTER — Telehealth: Payer: Self-pay | Admitting: Student

## 2023-09-16 DIAGNOSIS — R000 Tachycardia, unspecified: Secondary | ICD-10-CM

## 2023-09-16 NOTE — Telephone Encounter (Signed)
 Called pt and spouse numbers- no answer/ no voicemail

## 2023-09-16 NOTE — Telephone Encounter (Signed)
 Spoke to pts husband who stated that he is unable to check pt's bp d/t not having a bp cuff. Pt's husband stated that he will recheck pt's pulse at 3 pm.   Recheck while on the phone: 125 bpm

## 2023-09-16 NOTE — Telephone Encounter (Signed)
 Pt's husband stated that hr was 178 bpm. Husband stated that he gave her diltiazem and it helped a minimal amount. Pt denies sweating, sob, nausea, dizzy, lightheaded.  Pt stated that she walked her sitter out to the car and felt a little swimmy headed, but feeling stopped. Husband stated pt did not take Dilt this morning, only too medication when hr became elevated.   Husband stated that hr while on the phone, dropped to 168 bpm.   Please advise.

## 2023-09-16 NOTE — Telephone Encounter (Signed)
 Patient/Spouse notified and verbalized providers recommendations. 7 day event monitor mailed to pt's home for placement. Pt/spouse had no further questions or concerns.

## 2023-09-16 NOTE — Telephone Encounter (Signed)
 STAT if HR is under 50 or over 120  (normal HR is 60-100 beats per minute)  What is your heart rate? 176 HR right now  Do you have a log of your heart rate readings (document readings)? No  Do you have any other symptoms? Pt stated she's just not feeling well.

## 2023-09-22 ENCOUNTER — Ambulatory Visit (INDEPENDENT_AMBULATORY_CARE_PROVIDER_SITE_OTHER): Payer: 59 | Admitting: Podiatry

## 2023-09-22 ENCOUNTER — Encounter: Payer: Self-pay | Admitting: Podiatry

## 2023-09-22 DIAGNOSIS — B351 Tinea unguium: Secondary | ICD-10-CM

## 2023-09-22 DIAGNOSIS — E119 Type 2 diabetes mellitus without complications: Secondary | ICD-10-CM

## 2023-09-22 DIAGNOSIS — M79674 Pain in right toe(s): Secondary | ICD-10-CM

## 2023-09-22 DIAGNOSIS — M2041 Other hammer toe(s) (acquired), right foot: Secondary | ICD-10-CM

## 2023-09-22 DIAGNOSIS — M2042 Other hammer toe(s) (acquired), left foot: Secondary | ICD-10-CM

## 2023-09-22 NOTE — Progress Notes (Signed)
  Subjective:  Patient ID: [name], female    DOB: 09/22/1955,  MRN: [id number]  Chief Complaint  Patient presents with   Diabetes    RM 15: Est DFC . Diabetic patient in for Dfc. No concerns or complaints. Last A1C 5.6     68 y.o. female presents with the above complaint. History confirmed with patient. Patient presenting with pain related to dystrophic thickened elongated nails. Patient is unable to trim own nails related to nail dystrophy and/or mobility issues. Patient does have a history of T2DM. Patient does/does not have callus present located at the distal right second toe causing pain.  Patient is on home oxygen  Objective:  Physical Exam: warm, capillary refill 3 to 5 seconds to the digits, decreased skin turgor texture and tone, atrophic.  Diminished pedal hair growth nail exam onychomycosis of the toenails, onycholysis, and dystrophic nails DP pulses diminished faintly palpable, PT pulses diminished faintly palpable, and protective sensation absent.  Reports subjective neuropathic pain Left Foot:  Pain with palpation of nails due to elongation and dystrophic growth.  Right Foot: Pain with palpation of nails due to elongation and dystrophic growth.  Ulcerative callus present distal aspect of second toe causing pain Hammertoe contractures of lesser digits noted Assessment:   1. Diabetes mellitus without complication (HCC)   2. Pain due to onychomycosis of toenail of right foot   3. Hammer toes of both feet      Plan:  Patient was evaluated and treated and all questions answered.  #Hyperkeratotic lesions/pre ulcerative calluses present right second toe distal aspect All symptomatic hyperkeratoses x 1 separate lesions were safely debrided as a courtesy with a sterile #312 blade to patient's level of comfort without incident. We discussed preventative and palliative care of these lesions including supportive and accommodative shoegear, padding, prefabricated and  custom molded accommodative orthoses, use of a pumice stone and lotions/creams daily. -This is associated with patient's hammertoe deformity -Crest hammertoe pads x 2 dispensed  #Onychomycosis with pain  -Nails palliatively debrided as below. -Educated on self-care  Procedure: Nail Debridement Rationale: Pain Type of Debridement: manual, sharp debridement. Instrumentation: Nail nipper, rotary burr. Number of Nails: 10  Return in about 3 months (around 12/23/2023) for Diabetic Foot Care.         Bronwen Betters, DPM [facility] / [facility]

## 2023-09-26 ENCOUNTER — Ambulatory Visit: Payer: Self-pay | Admitting: Pulmonary Disease

## 2023-09-26 NOTE — Telephone Encounter (Signed)
 Chief Complaint: SOB Symptoms: chest congestion, wheezing Frequency: 2-3 days Pertinent Negatives: Patient denies fever, URI sx Disposition: [] ED /[x] Urgent Care (no appt availability in office) / [] Appointment(In office/virtual)/ []  [place] [facility]/ [] Home Care/ [] Refused Recommended Disposition /[] [place] [facility]/ []  Follow-up with PCP Additional Notes: Pt c/o wheezing x 2-3 days with chest congestion. Denies fever, URI sx. Pt reports using all respiratory INH as prescribed, as well as albuterol with minimal relief. Pt endorses on O2 at 4L and occasionally increases to 5L for recovery. Triager strongly reinforced UC for evaluation d/t time of day. Pt endorses transportation issues, so triager advised calling EMS. Triager will forward encounter for Dr Vassie Loll to review and advise. Patient verbalized understanding and is expecting call back from office for next steps, if any.   Reason for Disposition  [1] Longstanding difficulty breathing (e.g., CHF, COPD, emphysema) AND [2] WORSE than normal  Answer Assessment - Initial Assessment Questions E2C2 Pulmonary Triage - Initial Assessment Questions "Chief Complaint (e.g., cough, sob, wheezing, fever, chills, sweat or additional symptoms) *Go to specific symptom protocol after initial questions. Wheezing, chest congestion  "How long have symptoms been present?" 2-3 days ago  Have you tested for COVID or Flu? Note: If not, ask patient if a home test can be taken. If so, instruct patient to call back for positive results. Yes, reports negative 2 weeks ago  MEDICINES:   "Have you used any OTC meds to help with symptoms?" Yes If yes, ask "What medications?" Nyquil - no relief Robitussin - no relief  "Have you used your inhalers/maintenance medication?" Yes If yes, "What medications?" Combivent - 2 puffs 3 x a day Albuterol INH 2 puffs, 4 x daily - needs refill Abluterol neb PRN - last used a couple of hours ago  If inhaler, ask  "How many puffs and how often?" Note: Review instructions on medication in the chart. See above  OXYGEN: "Do you wear supplemental oxygen?" Yes If yes, "How many liters are you supposed to use?" 4L all day, sometimes 5L PRN SOB, will go back to 4L after recovery  "Do you monitor your oxygen levels?" Yes If yes, "What is your reading (oxygen level) today?" 92 today  "What is your usual oxygen saturation reading?"  (Note: Pulmonary O2 sats should be 90% or greater) 90's   3. PATTERN "Does the difficult breathing come and go, or has it been constant since it started?"      Comes and goes 4. SEVERITY: "How bad is your breathing?" (e.g., mild, moderate, severe)    - MILD: No SOB at rest, mild SOB with walking, speaks normally in sentences, can lie down, no retractions, pulse < 100.    - MODERATE: SOB at rest, SOB with minimal exertion and prefers to sit, cannot lie down flat, speaks in phrases, mild retractions, audible wheezing, pulse 100-120.    - SEVERE: Very SOB at rest, speaks in single words, struggling to breathe, sitting hunched forward, retractions, pulse > 120      Moderate SOB - increased from baseline 5. RECURRENT SYMPTOM: "Have you had difficulty breathing before?" If Yes, ask: "When was the last time?" and "What happened that time?"      A couples months ago - reports MD calls something in 6. CARDIAC HISTORY: "Do you have any history of heart disease?" (e.g., heart attack, angina, bypass surgery, angioplasty)      CHF 7. LUNG HISTORY: "Do you have any history of lung disease?"  (e.g., pulmonary embolus, asthma,  emphysema)     COPD, denies PE 8. CAUSE: "What do you think is causing the breathing problem?"      COPD exacerbation 9. OTHER SYMPTOMS: "Do you have any other symptoms? (e.g., dizziness, runny nose, cough, chest pain, fever)     denies  Protocols used: Breathing Difficulty-A-AH

## 2023-09-27 ENCOUNTER — Ambulatory Visit: Payer: 59 | Admitting: Physician Assistant

## 2023-09-28 ENCOUNTER — Ambulatory Visit: Payer: Self-pay

## 2023-09-28 ENCOUNTER — Other Ambulatory Visit (HOSPITAL_BASED_OUTPATIENT_CLINIC_OR_DEPARTMENT_OTHER): Payer: Self-pay

## 2023-09-28 MED ORDER — PREDNISONE 10 MG PO TABS: ORAL_TABLET | ORAL | 0 refills | Status: DC

## 2023-09-28 MED ORDER — ALBUTEROL SULFATE HFA 108 (90 BASE) MCG/ACT IN AERS: 1.0000 | INHALATION_SPRAY | Freq: Four times a day (QID) | RESPIRATORY_TRACT | 2 refills | Status: DC | PRN

## 2023-09-28 MED ORDER — DOXYCYCLINE HYCLATE 100 MG PO TABS: 100.0000 mg | ORAL_TABLET | Freq: Two times a day (BID) | ORAL | 0 refills | Status: DC

## 2023-09-28 MED ORDER — COMBIVENT RESPIMAT 20-100 MCG/ACT IN AERS: 1.0000 | INHALATION_SPRAY | Freq: Four times a day (QID) | RESPIRATORY_TRACT | 5 refills | Status: DC | PRN

## 2023-09-28 NOTE — Telephone Encounter (Signed)
 Copied from CRM [id number]. Topic: Clinical - Red Word Triage >> Sep 28, 2023  9:11 AM [name] wrote: Red Word that prompted transfer to Nurse Triage: Patient states she's experiencing Peterson flare up in her lungs, experiencing shortness of breath and wheezing.  Requesting an antibiotic or Peterson steroid.   Chief Complaint: Shortness of breath/Wheezing Symptoms: shortness of breath, runny nose,  Frequency: Peterson couple of days Pertinent Negatives: Patient denies covid/flu, fever, dizziness, chest pain, nausea, vomiting, diarrhea, coughing up blood Disposition: [] ED /[] Urgent Care (no appt availability in office) / [] Appointment(In office/virtual)/ []  [place] [facility]/ [] Home Care/ [x] Refused Recommended Disposition /[] [place] [facility]/ []  Follow-up with PCP Additional Notes: Patient called and advised that she has been having Patient states that she ran out of two medications--Albuterol and Combivent. Patient states that she feels about the same as the other day and sometimes she feels Peterson little better than she did. Patient denies any fever, covid/flu, dizziness, chest pain, nausea, vomiting, diarrhea, coughing up blood. Patient on 4 Liters of Oxygen 24/7 Patient states that she does not have transportation and just wanted medication called in for her---an antibiotic or steroid. Patient is advised that she has no transportation at this time and just wanted medication called in and the two refills sent in as well for her Albuterol and her Comibvent Respimat prescriptions that she ran out of last night. Patient is advised that EMS is always there if she needs them. Patient is advised that if anything worsens to call 911 for an ambulance to take her to the Emergency Room.  Patient verbalized understanding.   Reason for Disposition  [1] Longstanding difficulty breathing (e.g., CHF, COPD, emphysema) AND [2] WORSE than normal  Answer Assessment - Initial Assessment Questions E2C2 Pulmonary Triage -  Initial Assessment Questions "Chief Complaint (e.g., cough, sob, wheezing, fever, chills, sweat or additional symptoms) *Go to specific symptom protocol after initial questions. Wheezing, shortness of breath  "How long have symptoms been present?" Peterson couple days  Have you tested for COVID or Flu? Note: If not, ask patient if Peterson home test can be taken. If so, instruct patient to call back for positive results. Yes  Does not have COVID or FLU  MEDICINES:   "Have you used any OTC meds to help with symptoms?" No If yes, ask "What medications?" N/Peterson  "Have you used your inhalers/maintenance medication?" Yes If yes, "What medications?" Breztri  If inhaler, ask "How many puffs and how often?" Note: Review instructions on medication in the chart. 2 puffs in the morning and 2 puffs at bedtime  OXYGEN: "Do you wear supplemental oxygen?" Yes If yes, "How many liters are you supposed to use?" 4 Liters  "Do you monitor your oxygen levels?" Yes If yes, "What is your reading (oxygen level) today?" 99% on 4 Liters O2 and 86 pulse rate  "What is your usual oxygen saturation reading?"  (Note: Pulmonary O2 sats should be 90% or greater) Usually in the 90s     1. RESPIRATORY STATUS: "Describe your breathing?" (e.g., wheezing, shortness of breath, unable to speak, severe coughing)      Shortness of breath, wheezing 2. ONSET: "When did this breathing problem begin?"      Peterson couple of days ago 3. PATTERN "Does the difficult breathing come and go, or has it been constant since it started?"      Off and on 4. SEVERITY: "How bad is your breathing?" (e.g., mild, moderate, severe)    - MILD: No  SOB at rest, mild SOB with walking, speaks normally in sentences, can lie down, no retractions, pulse < 100.    - MODERATE: SOB at rest, SOB with minimal exertion and prefers to sit, cannot lie down flat, speaks in phrases, mild retractions, audible wheezing, pulse 100-120.    - SEVERE: Very SOB at rest, speaks  in single words, struggling to breathe, sitting hunched forward, retractions, pulse > 120      moderate 5. RECURRENT SYMPTOM: "Have you had difficulty breathing before?" If Yes, ask: "When was the last time?" and "What happened that time?"      Yes-- 6. CARDIAC HISTORY: "Do you have any history of heart disease?" (e.g., heart attack, angina, bypass surgery, angioplasty)      Congestive heart failure 7. LUNG HISTORY: "Do you have any history of lung disease?"  (e.g., pulmonary embolus, asthma, emphysema)     COPD 8. CAUSE: "What do you think is causing the breathing problem?"      Flare up  --ran out of two medications--Albuterol and Combivent Respimat 9. OTHER SYMPTOMS: "Do you have any other symptoms? (e.g., dizziness, runny nose, cough, chest pain, fever)     Runny nose 10. O2 SATURATION MONITOR:  "Do you use an oxygen saturation monitor (pulse oximeter) at home?" If Yes, ask: "What is your reading (oxygen level) today?" "What is your usual oxygen saturation reading?" (e.g., 95%)       99% 12. TRAVEL: "Have you traveled out of the country in the last month?" (e.g., travel history, exposures)       no  Protocols used: Breathing Difficulty-Peterson-AH

## 2023-09-28 NOTE — Telephone Encounter (Signed)
 I have sent rfs for Albuterol and Combivent to her pharmacy, but pt is also requesting Antibiotics and Steroids due to wheezing and Shortness of breath

## 2023-09-28 NOTE — Addendum Note (Signed)
 Addended by: Amada Kingfisher on: 09/28/2023 11:45 AM   Modules accepted: Orders

## 2023-09-28 NOTE — Addendum Note (Signed)
 Addended by: Amada Kingfisher on: 09/28/2023 11:48 AM   Modules accepted: Orders

## 2023-09-28 NOTE — Telephone Encounter (Signed)
 Pt notified and reminded about appt on 3/31 she states she will be here then. I called rxs to [place] they will deliver today per pharmacy

## 2023-09-29 ENCOUNTER — Telehealth: Payer: Self-pay | Admitting: Student

## 2023-09-29 ENCOUNTER — Other Ambulatory Visit (HOSPITAL_COMMUNITY): Payer: Self-pay | Admitting: Neurosurgery

## 2023-09-29 DIAGNOSIS — M48062 Spinal stenosis, lumbar region with neurogenic claudication: Secondary | ICD-10-CM

## 2023-09-29 NOTE — Telephone Encounter (Signed)
 Patients husband [name] called about her having a rapid heart rate that was initially in the 120's and decreased to the 110's after taking the evening dose of cardizem 30 mg. Denies chest pain, and palpitations. Took a dose of albuterol 20 minutes ago.    They do not have a blood pressure cuff to measure blood pressure. I review of EKG patient is in the 90's. Is difficult to recommend addition medications regarding her heart rate as we do not know her blood pressure or heart rhythm.  Recommended patient contact EMS services to evaluate her heart rate, rhythm, and see what her blood pressure was. If she is in normal sinus rhythm and does not have a low blood pressure we recommended that they may take an additional cardizem.   [name] PA-C

## 2023-09-30 NOTE — Telephone Encounter (Signed)
 Attempt to reach, both patient and spouse's phone number say cannot be completed as dialed. Letter mailed with appointment .

## 2023-09-30 NOTE — Telephone Encounter (Signed)
 Attempt to reach both patient and spouse, numbers listed will not put call through.

## 2023-10-03 ENCOUNTER — Ambulatory Visit (HOSPITAL_BASED_OUTPATIENT_CLINIC_OR_DEPARTMENT_OTHER): Payer: 59 | Admitting: Pulmonary Disease

## 2023-10-03 ENCOUNTER — Telehealth (HOSPITAL_BASED_OUTPATIENT_CLINIC_OR_DEPARTMENT_OTHER): Payer: Self-pay | Admitting: Pulmonary Disease

## 2023-10-03 ENCOUNTER — Encounter (HOSPITAL_BASED_OUTPATIENT_CLINIC_OR_DEPARTMENT_OTHER): Payer: Self-pay | Admitting: Pulmonary Disease

## 2023-10-03 ENCOUNTER — Ambulatory Visit: Payer: Self-pay | Admitting: Pulmonary Disease

## 2023-10-03 NOTE — Telephone Encounter (Unsigned)
 Copied from CRM [id number]. Topic: Clinical - Medication Refill >> Oct 03, 2023  3:48 PM Adele Barthel wrote: Most Recent Primary Care Visit:   Medication: nystatin, not currently on medication list so unable to pend medication. Sending to nurse for review.   Has the patient contacted their pharmacy? No (Agent: If no, request that the patient contact the pharmacy for the refill. If patient does not wish to contact the pharmacy document the reason why and proceed with request.) (Agent: If yes, when and what did the pharmacy advise?)  Is this the correct pharmacy for this prescription? Yes If no, delete pharmacy and type the correct one.  This is the patient's preferred pharmacy:  [facility] - [place], [place] - [address] [place] [address] Phone: [phone] Fax: [phone]   Has the prescription been filled recently? Yes  Is the patient out of the medication? Yes  Has the patient been seen for an appointment in the last year OR does the patient have an upcoming appointment? Yes, 01/16/2024  Can we respond through MyChart? Yes  Agent: Please be advised that Rx refills may take up to 3 business days. We ask that you follow-up with your pharmacy.

## 2023-10-03 NOTE — Telephone Encounter (Signed)
 Pt is requesting refill on Nystatin for thrush

## 2023-10-03 NOTE — Telephone Encounter (Signed)
 Chief Complaint: SOB Symptoms: tachycardia, SOB Frequency: Friday, worse this AM Pertinent Negatives: Patient denies fever, URI sx, CP Disposition: [x] ED /[] Urgent Care (no appt availability in office) / [] Appointment(In office/virtual)/ []  [place] [facility]/ [] Home Care/ [] Refused Recommended Disposition /[] [place] [facility]/ []  Follow-up with PCP Additional Notes: Pt c/o of SOB and tachycardia. Reports earlier this AM, SpO2 was in the high 80s, and took all respiratory INH as prescribed. Upon NT, pt SpO2 in high 90s, but tachycardic. Of note, pt has transportation issues. Triager advised ED for further evaluation. Patient verbalized understanding and to call 911 with worsening symptoms.    Copied from CRM [id number]. Topic: Clinical - Red Word Triage >> Oct 03, 2023  1:21 PM Adele Barthel wrote: Red Word that prompted transfer to Nurse Triage:   Oxygen levels dropped to around 74 percent, currently 81 percent Severely fatigued Hasn't been feeling good since Friday- Off balance and was dizzy  Still having occasional dizziness.  History of COPD Was trying to schedule appt Reason for Disposition  SEVERE difficulty breathing (e.g., struggling for each breath, speaks in single words, pulse > 120)  Answer Assessment - Initial Assessment Questions E2C2 Pulmonary Triage - Initial Assessment Questions "Chief Complaint (e.g., cough, sob, wheezing, fever, chills, sweat or additional symptoms) *Go to specific symptom protocol after initial questions. SOB, lightheadedness  "How long have symptoms been present?" Friday  Have you tested for COVID or Flu? Note: If not, ask patient if a home test can be taken. If so, instruct patient to call back for positive results. No  MEDICINES:   "Have you used any OTC meds to help with symptoms?" No If yes, ask "What medications?" N/a  "Have you used your inhalers/maintenance medication?" Yes If yes, "What medications?" Breztri - 2 puffs twice a  day Albuterol PRN - last used a few minutes ago - provided some relief  If inhaler, ask "How many puffs and how often?" Note: Review instructions on medication in the chart. See above  OXYGEN: "Do you wear supplemental oxygen?" Yes If yes, "How many liters are you supposed to use?" 4-5L  "Do you monitor your oxygen levels?" Yes If yes, "What is your reading (oxygen level) today?" 97 HR 109  "What is your usual oxygen saturation reading?"  (Note: Pulmonary O2 sats should be 90% or greater) Upper 90s   2.  OXYGEN EQUIPMENT:  Are you having trouble with your oxygen equipment?  (e.g., cannula, mask, tubing, tank, concentrator)     denies 3. ONSET: "When did the  hypoxia  start?"      This AM 7. VSS MONITORING: "Do you monitor/measure your oxygen level or vital signs (e.g., yes, no, measurements are automatically sent to provider/call center). Document CURRENT and NORMAL BASELINE values if available.     -  P: "What is your pulse rate per minute?"   -  RR: "What is your respiratory rate per minute?"     HR 172 SpO2 99 8. BREATHING DIFFICULTY: "Are you having any difficulty breathing?" If Yes, ask: "How bad is it?"  (e.g., none, mild, moderate, severe)   - MILD: No SOB at rest, mild SOB with walking, speaks normally in sentences, able to lie down, no retractions, pulse < 100.   - MODERATE: SOB at rest, SOB with minimal exertion and prefers to sit, cannot lie down flat, speaks in phrases, mild retractions, audible wheezing, pulse 100-120.   - SEVERE: Very SOB at rest, speaks in single words, struggling to breathe, sitting  hunched forward, retractions, pulse > 120      Endorses SOB above baseline - moderate Triager appreciates pt speaking in almost full sentences, but concerning for HR. 9. OTHER SYMPTOMS: "Do you have any other symptoms?" (e.g., fever, change in sputum)     Hx of CHF  Protocols used: COPD Oxygen Monitoring and Hypoxia-A-AH

## 2023-10-03 NOTE — Telephone Encounter (Signed)
 FYI pt sent to ED by EMS per Triage

## 2023-10-04 ENCOUNTER — Other Ambulatory Visit: Payer: Self-pay | Admitting: Pulmonary Disease

## 2023-10-04 MED ORDER — NYSTATIN 100000 UNIT/ML MT SUSP
5.0000 mL | Freq: Two times a day (BID) | OROMUCOSAL | 2 refills | Status: DC
Start: 2023-10-04 — End: 2024-02-29

## 2023-10-04 NOTE — Telephone Encounter (Signed)
 Rx sent to pharmacy and pt notified  ?

## 2023-10-14 ENCOUNTER — Telehealth (HOSPITAL_BASED_OUTPATIENT_CLINIC_OR_DEPARTMENT_OTHER): Payer: Self-pay

## 2023-10-14 ENCOUNTER — Ambulatory Visit: Admitting: Nurse Practitioner

## 2023-10-14 DIAGNOSIS — E119 Type 2 diabetes mellitus without complications: Secondary | ICD-10-CM

## 2023-10-14 DIAGNOSIS — Z7984 Long term (current) use of oral hypoglycemic drugs: Secondary | ICD-10-CM

## 2023-10-14 DIAGNOSIS — I10 Essential (primary) hypertension: Secondary | ICD-10-CM

## 2023-10-14 NOTE — Telephone Encounter (Signed)
 Pt needs rx on Albuterol inhaler not a new inhaler,spoke with pharmacy she has refills on file and does not need a new rx.   Copied from CRM [id number]. Topic: Clinical - Medication Question >> Oct 14, 2023 12:37 PM Brennan Bailey S wrote: Reason for CRM: patient wants to get a new different inhaler, please call patient.

## 2023-10-21 ENCOUNTER — Ambulatory Visit: Payer: Self-pay | Admitting: Pulmonary Disease

## 2023-10-21 ENCOUNTER — Ambulatory Visit (HOSPITAL_BASED_OUTPATIENT_CLINIC_OR_DEPARTMENT_OTHER): Payer: Self-pay | Admitting: Pulmonary Disease

## 2023-10-21 NOTE — Telephone Encounter (Signed)
 Copied from CRM [id number]. Topic: Clinical - Red Word Triage >> Oct 21, 2023  2:28 PM [name] wrote: Red Word that prompted transfer to Nurse Triage:  Wheezing, congestion in the chest, shortness of breath for the past few days - patient additionally calling to request some antibotics and/or prednisone  prescription. Patient normally sees Dr. Jude.     Chief Complaint: Shortness of Breath Symptoms: cough, wheezing, shortness of breath Frequency: x 3-4 days Pertinent Negatives: Patient denies dizziness, runny nose, chest pain, fever Disposition: [x] ED /[] Urgent Care (no appt availability in office) / [] Appointment(In office/virtual)/ []  [place] [facility]/ [] Home Care/ [] Refused Recommended Disposition /[] [place] [facility]/ []  Follow-up with PCP Additional Notes: Patient called and advised that for the past 3-4 days she has been feeling bad. Patient states that she has had a productive cough with some white phlegm and green in it. Patient states that the last time she felt like she was a few months ago and she said her provider called her in some prednisone  and an antibiotic. Patient's Oxygen  saturation during Triage is 92%-95% while on 5 liters of Oxygen  and her heart rate is 108. She denies any dizziness, fever, runny nose, or chest pain. Patient states that yesterday her Oxygen  was 85% but she states that she was walking around. Patient's chart showed that the patient was sick about 2-3 weeks ago and had to go to the Emergency Room. Patient was saying that she is always short of breath but she states that her Oxygen  level was around 85% last night after exerting herself.  She was advised that this was low and concerning that she needs to be assessed as soon as possible. Patient is advised that at this time, it is recommended that she goes to the Emergency Room for further evaluation by a provider.  Patient is agreeable to this recommendation. This RN asked the patient if she needed  an ambulance. She denied wanting an ambulance at this time and stated that she was going to wait for her husband to get back home here in the next 30 minutes.  She is advised that if she gets worse to go ahead and call 911 so an ambulance can take her to the Emergency Room if she can't wait for her husband to get home. Patient verbalized understanding.   Reason for Disposition  Patient sounds very sick or weak to the triager  Answer Assessment - Initial Assessment Questions E2C2 Pulmonary Triage - Initial Assessment Questions Chief Complaint (e.g., cough, sob, wheezing, fever, chills, sweat or additional symptoms) *Go to specific symptom protocol after initial questions. Shortness of breath, wheezing, cough  How long have symptoms been present? 3-4 days  Have you tested for COVID or Flu? Note: If not, ask patient if a home test can be taken. If so, instruct patient to call back for positive results. No  MEDICINES:   Have you used any OTC meds to help with symptoms? Yes If yes, ask What medications? Nyquil  Have you used your inhalers/maintenance medication? Yes If yes, What medications? Albuterol  Nebulizer Albuterol  Inhaler-- 1-2 puffs every 6 hours as needed Duoneb--1 puff every 6 hours as needed Duoned Nebulizer--3mL every 4 hours as needed   If inhaler, ask How many puffs and how often? Note: Review instructions on medication in the chart. Albuterol  Nebulizer Albuterol  Inhaler-- 1-2 puffs every 6 hours as needed Duoneb--1 puff every 6 hours as needed Duoned Nebulizer--3mL every 4 hours as needed  OXYGEN : Do you wear supplemental oxygen ?  Yes If yes, How many liters are you supposed to use? 5 liters  patient increased this to 6 liters yesterday at one point for about an hour  Do you monitor your oxygen  levels? No If yes, What is your reading (oxygen  level) today? Patient states she checked it once yesterday and 85%. 92% at this time  What is your  usual oxygen  saturation reading?  (Note: Pulmonary O2 sats should be 90% or greater) ----     1. RESPIRATORY STATUS: Describe your breathing? (e.g., wheezing, shortness of breath, unable to speak, severe coughing)      Shortness of breath, wheezing, productive cough--off white color with some green in it 2. ONSET: When did this breathing problem begin?      3-4 days ago 3. PATTERN Does the difficult breathing come and go, or has it been constant since it started?      Constant 4. SEVERITY: How bad is your breathing? (e.g., mild, moderate, severe)    - MILD: No SOB at rest, mild SOB with walking, speaks normally in sentences, can lie down, no retractions, pulse < 100.    - MODERATE: SOB at rest, SOB with minimal exertion and prefers to sit, cannot lie down flat, speaks in phrases, mild retractions, audible wheezing, pulse 100-120.    - SEVERE: Very SOB at rest, speaks in single words, struggling to breathe, sitting hunched forward, retractions, pulse > 120      Patient states not worse than she normally feels at baseline--but worse on exertion 5. RECURRENT SYMPTOM: Have you had difficulty breathing before? If Yes, ask: When was the last time? and What happened that time?      A couple of months ago---flare up---provider called in prednisone  and an antibiotic at that time 6. CARDIAC HISTORY: Do you have any history of heart disease? (e.g., heart attack, angina, bypass surgery, angioplasty)      Significant 7. LUNG HISTORY: Do you have any history of lung disease?  (e.g., pulmonary embolus, asthma, emphysema)     COPD 8. CAUSE: What do you think is causing the breathing problem?      unknown 9. OTHER SYMPTOMS: Do you have any other symptoms? (e.g., dizziness, runny nose, cough, chest pain, fever)     Cough, wheezing, headache 10. O2 SATURATION MONITOR:  Do you use an oxygen  saturation monitor (pulse oximeter) at home? If Yes, ask: What is your reading (oxygen  level)  today? What is your usual oxygen  saturation reading? (e.g., 95%)       92% right now during triage with this RN 12. TRAVEL: Have you traveled out of the country in the last month? (e.g., travel history, exposures)       No  Protocols used: Breathing Difficulty-A-AH

## 2023-10-21 NOTE — Telephone Encounter (Signed)
 Copied from CRM [id number]. Topic: Clinical - Red Word Triage >> Oct 21, 2023  1:24 PM Roseanne Cones wrote: [facility] that prompted transfer to Nurse Triage: Wheezing, congestion in the chest, shortness of breath for the past few days - patient additionally calling to request some antibotics and/or prednisone  prescription. Patient normally sees Dr. Villa Greaser.

## 2023-10-24 ENCOUNTER — Encounter (HOSPITAL_COMMUNITY): Payer: Self-pay

## 2023-10-24 ENCOUNTER — Ambulatory Visit (HOSPITAL_COMMUNITY)

## 2023-10-24 ENCOUNTER — Telehealth: Payer: Self-pay

## 2023-10-24 MED ORDER — PREDNISONE 10 MG PO TABS: ORAL_TABLET | ORAL | 0 refills | Status: DC

## 2023-10-24 NOTE — Telephone Encounter (Signed)
 Pt notified of the need for appt and her rx was sent to pharmacy. Can you please? she needs an in person appointment with APP at [facility] clinic for evaluation

## 2023-10-24 NOTE — Addendum Note (Signed)
 Addended by: Kary Pages on: 10/24/2023 02:53 PM   Modules accepted: Orders

## 2023-10-24 NOTE — Telephone Encounter (Signed)
 Copied from CRM [id number]. Topic: Clinical - Medication Question >> Oct 21, 2023 10:46 AM Juliaette Ober wrote: Reason for CRM: patient calling to get antibiotic prescription for steroids sent to pharmacy because she is wheezing and coughing.  Routing to Dr Villa Greaser.

## 2023-10-24 NOTE — Telephone Encounter (Signed)
 There's nothing available until 6/23 in [facility]. Same for [facility] and [facility]. She is already on the schedule to see Villa Greaser on 7/14. Do we need to move her up still?

## 2023-10-24 NOTE — Telephone Encounter (Signed)
**Note De-identified  [place] Obfuscation** Please advise 

## 2023-10-25 NOTE — Telephone Encounter (Signed)
 Please see if this patient can come in tomorrow at the open 2:45 slot. Thank you!

## 2023-11-02 ENCOUNTER — Ambulatory Visit (HOSPITAL_COMMUNITY)

## 2023-11-03 ENCOUNTER — Ambulatory Visit: Attending: Student | Admitting: Student

## 2023-11-03 NOTE — Progress Notes (Deleted)
 Cardiology Office Note    Date:  11/03/2023  ID:  [name], DOB Mar 10, 1956, MRN [id number] Cardiologist: Armida Lander, MD    History of Present Illness:    [name] is a 68 y.o. female with past medical history of coronary calcifications (by prior CT imaging with low-risk NST in 2015, NSTEMI in 01/2022 and medical management pursued given her multiple medical issues and substance abuse, catheterization readdressed in 2023 but she canceled and no showed for the procedure), chronic HFimpEF (EF 40-45% by echo in 01/2022, improved to 55-60% by echo in 04/2022), HTN, HLD, Type II DM, suspicious lung nodule (prior PET suspicious for adenocarcinoma and underwent empiric radiation therapy), cocaine use and COPD who presents to the office today for 6-week follow-up.   She was examined by myself in 09/2023 and reported still having intermittent palpitations which would occur a few days each week. She had been taking Lopressor  25 mg twice daily and reported having worsening shortness of breath with this.  She was on 4 to 5 L nasal cannula 24/7 given her COPD. It was recommended to stop Lopressor  and switch to short-acting Cardizem  30 mg twice daily and if symptoms did not improve, could obtain a Zio patch.  The patient's spouse called the office later that week reporting she was having frequent tachycardia. A 7-day Zio patch was recommended. The preliminary report of this shows predominately NSR with average HR of 91 bpm. She did have 26 episodes of SVT with the longest lasting 43.3 seconds. Noted to have rare PAC's and PVC's but less than 1% burden.   ROS: ***  Studies Reviewed:   EKG: EKG is*** ordered today and demonstrates ***   EKG Interpretation Date/Time:    Ventricular Rate:    PR Interval:    QRS Duration:    QT Interval:    QTC Calculation:   R Axis:      Text Interpretation:         Echocardiogram: 09/2022 IMPRESSIONS     1. Left ventricular  ejection fraction, by estimation, is 65 to 70%. The  left ventricle has normal function. The left ventricle has no regional  wall motion abnormalities. Left ventricular diastolic parameters were  normal.   2. Right ventricular systolic function is normal. The right ventricular  size is normal. Tricuspid regurgitation signal is inadequate for assessing  PA pressure.   3. The mitral valve is grossly normal. Trivial mitral valve  regurgitation.   4. The aortic valve is tricuspid. Aortic valve regurgitation is not  visualized.   5. The inferior vena cava is dilated in size with >50% respiratory  variability, suggesting right atrial pressure of 8 mmHg.   Comparison(s): Prior images reviewed side by side. LVEF vigorous at  65-70%.    Event Monitor: 10/2023 Patch Wear Time:  6 days and 22 hours (2025-03-20T10:54:31-0400 to 2025-03-27T09:13:31-0400)   Patient had a min HR of 68 bpm, max HR of 200 bpm, and avg HR of 91 bpm. Predominant underlying rhythm was Sinus Rhythm. 26 Supraventricular Tachycardia runs occurred, the run with the fastest interval lasting 4 beats with a max rate of 200 bpm, the  longest lasting 43.3 secs with an avg rate of 128 bpm. Isolated SVEs were rare (<1.0%), SVE Couplets were rare (<1.0%), and SVE Triplets were rare (<1.0%). Isolated VEs were rare (<1.0%), VE Couplets were rare (<1.0%), and no VE Triplets were present.   Risk Assessment/Calculations:   {Does this patient have ATRIAL FIBRILLATION?:[phone]} No BP  recorded.  {Refresh Note OR Click here to enter BP  :1}***         Physical Exam:   VS:  There were no vitals taken for this visit.   Wt Readings from Last 3 Encounters:  09/13/23 165 lb 3.2 oz (74.9 kg)  09/09/23 171 lb 6.4 oz (77.7 kg)  08/29/23 165 lb (74.8 kg)     GEN: Well nourished, well developed in no acute distress NECK: No JVD; No carotid bruits CARDIAC: ***RRR, no murmurs, rubs, gallops RESPIRATORY:  Clear to auscultation without  rales, wheezing or rhonchi  ABDOMEN: Appears non-distended. No obvious abdominal masses. EXTREMITIES: No clubbing or cyanosis. No edema.  Distal pedal pulses are 2+ bilaterally.   Assessment and Plan:   1. Palpitations - Recent monitor as outlined above showed episodes of SVT with the longest episode lasting for 43 seconds.  She did have rare PAC's and PVC's but less than 1% burden.  2. CAD/HLD - She had a medically managed NSTEMI in 2023 given her medical issues and substance use at that time. A cardiac catheterization was later reviewed but she declined. Medical management was recommended earlier this year when her case was reviewed with Dr. Amanda Jungling given her multiple medical issues. - LDL was at 31 when checked in 08/2023.  3. Chronic HFimpEF - Her ejection fraction was at 40 to 45% in 2023 and had normalized to 65 to 70% by repeat echocardiogram in 09/2022. ***  4. HTN - BP is at ***during today's visit. ***  5. COPD/Lung Cancer - She is on 4 to 5 L nasal cannula at baseline and followed by pulmonology.  Previously underwent empiric radiation for presumed lung cancer is felt to be too high risk for surgery.  Signed, Dorma Gash, PA-C

## 2023-11-04 ENCOUNTER — Ambulatory Visit (HOSPITAL_BASED_OUTPATIENT_CLINIC_OR_DEPARTMENT_OTHER): Payer: Self-pay | Admitting: Pulmonary Disease

## 2023-11-04 ENCOUNTER — Ambulatory Visit: Payer: Self-pay

## 2023-11-04 NOTE — Telephone Encounter (Signed)
 Third attempt to contact patient. Voicemail is full and unable to leave a message. Will route to office.

## 2023-11-04 NOTE — Telephone Encounter (Signed)
 Chief Complaint: SOB Symptoms: chest tightness, productive cough Frequency: 2-3 days Pertinent Negatives: Patient denies fever, URI sx, CP, severe SOB Disposition: [x] ED /[x] Urgent Care (no appt availability in office) / [] Appointment(In office/virtual)/ []  [place] [facility]/ [] Home Care/ [] Refused Recommended Disposition /[] [place] [facility]/ []  Follow-up with PCP Additional Notes: Pt c/o mild SOB above baseline with exertion, as well as productive cough and chest tightness. Pt reports having a "flare" and Dr. Villa Greaser typically calls her in medication. Pt reports taking respiratory INH as prescribed, and is currently out of her albuterol . Triager offered to schedule [facility] appt per protocol, but pt does not have transportation. D/t pt sx and no rescue INH, Triager advised calling EMS to evaluate/treat. Patient verbalized understanding and to call 911.    Copied from CRM [id number]. Topic: Clinical - Red Word Triage >> Nov 04, 2023  3:42 PM [name] wrote: Red Word that prompted transfer to Nurse Triage: tightness in chest/ feels like infection, not breathing good Reason for Disposition  [1] Longstanding difficulty breathing (e.g., CHF, COPD, emphysema) AND [2] WORSE than normal  Answer Assessment - Initial Assessment Questions E2C2 Pulmonary Triage - Initial Assessment Questions "Chief Complaint (e.g., cough, sob, wheezing, fever, chills, sweat or additional symptoms) *Go to specific symptom protocol after initial questions. SOB, chest tightness "reports flare up" d/t pollen, dry/productive white cough  "How long have symptoms been present?" X 3 days  Have you tested for COVID or Flu? Note: If not, ask patient if a home test can be taken. If so, instruct patient to call back for positive results. No  MEDICINES:   "Have you used any OTC meds to help with symptoms?" No If yes, ask "What medications?" Robitussin with some relief  "Have you used your inhalers/maintenance  medication?" Yes If yes, "What medications?" Breztri  - 2 puffs 2x daily Combivent  - 1 puff q4-6 hrs daily Albuterol  - reports does not have rx "it's out, I think it's too soon to fill"   If inhaler, ask "How many puffs and how often?" Note: Review instructions on medication in the chart. See above  OXYGEN : "Do you wear supplemental oxygen ?" No If yes, "How many liters are you supposed to use?" 4-5L ATC  "Do you monitor your oxygen  levels?" Yes If yes, "What is your reading (oxygen  level) today?" 92 this AM  "What is your usual oxygen  saturation reading?"  (Note: Pulmonary O2 sats should be 90% or greater) 97    3. PATTERN "Does the difficult breathing come and go, or has it been constant since it started?"      Comes and goes 4. SEVERITY: "How bad is your breathing?" (e.g., mild, moderate, severe)    - MILD: No SOB at rest, mild SOB with walking, speaks normally in sentences, can lie down, no retractions, pulse < 100.    - MODERATE: SOB at rest, SOB with minimal exertion and prefers to sit, cannot lie down flat, speaks in phrases, mild retractions, audible wheezing, pulse 100-120.    - SEVERE: Very SOB at rest, speaks in single words, struggling to breathe, sitting hunched forward, retractions, pulse > 120      Mild-moderate - mostly with exertion Triager did not appreciate any audible wheezing, and pt speaking in partial-full sentences during call.  5. RECURRENT SYMPTOM: "Have you had difficulty breathing before?" If Yes, ask: "When was the last time?" and "What happened that time?"      "Most of the time they call me in something" 6. CARDIAC  HISTORY: "Do you have any history of heart disease?" (e.g., heart attack, angina, bypass surgery, angioplasty)      CHF 7. LUNG HISTORY: "Do you have any history of lung disease?"  (e.g., pulmonary embolus, asthma, emphysema)     COPD 8. CAUSE: "What do you think is causing the breathing problem?"      Flare up 9. OTHER SYMPTOMS: "Do you  have any other symptoms? (e.g., dizziness, runny nose, cough, chest pain, fever)    Occasional runny nose, denies others  Protocols used: Breathing Difficulty-A-AH

## 2023-11-04 NOTE — Telephone Encounter (Signed)
 1st attempt to contact patient. Voicemail is full. Will place in callbacks.   Copied from CRM [id number]. Topic: Clinical - Medication Question >> Nov 04, 2023 11:04 AM [name] wrote: Reason for CRM: Patient is calling to request antibiotics be sent info for her current COPD flare-up.

## 2023-11-04 NOTE — Telephone Encounter (Signed)
 Second attempt to contact patient-unable to leave a voicemail as mailbox is full. Will place back in callbacks.

## 2023-11-04 NOTE — Telephone Encounter (Signed)
 Unable to reach patient no answer and pt VM box is full, Please advise as pt is requesting ABX for exacerbation

## 2023-11-07 ENCOUNTER — Telehealth: Payer: Self-pay | Admitting: Pulmonary Disease

## 2023-11-07 MED ORDER — DOXYCYCLINE HYCLATE 100 MG PO CAPS: 100.0000 mg | ORAL_CAPSULE | Freq: Two times a day (BID) | ORAL | 0 refills | Status: DC

## 2023-11-07 NOTE — Addendum Note (Signed)
 Addended by: Pauline Trainer L on: 11/07/2023 02:14 PM   Modules accepted: Orders

## 2023-11-07 NOTE — Telephone Encounter (Signed)
 Thanks so much.

## 2023-11-07 NOTE — Telephone Encounter (Signed)
 Called patient to discuss scheduling next available with APP (per Dr. Hortense Lyons request) no answer and voice mail is full----

## 2023-11-07 NOTE — Telephone Encounter (Signed)
 Pt requesting antibiotics for exacerbation

## 2023-11-07 NOTE — Telephone Encounter (Signed)
 Mail box is full and cannot accept messages at this time.

## 2023-11-07 NOTE — Telephone Encounter (Signed)
 Will  you please scehdule this patient for an inperson visit not a video or phone Dr Villa Greaser states she needs next available I sent an rx for her for now

## 2023-11-09 NOTE — Telephone Encounter (Signed)
 Called to discuss scheduling the next available appointment with an APP per Dr. Karel Osler answer and voice mail is full

## 2023-11-11 ENCOUNTER — Ambulatory Visit (HOSPITAL_COMMUNITY): Admission: RE | Admit: 2023-11-11 | Source: Ambulatory Visit

## 2023-11-12 ENCOUNTER — Other Ambulatory Visit (HOSPITAL_BASED_OUTPATIENT_CLINIC_OR_DEPARTMENT_OTHER): Payer: Self-pay | Admitting: Pulmonary Disease

## 2023-11-16 ENCOUNTER — Encounter: Payer: Self-pay | Admitting: Pulmonary Disease

## 2023-11-16 NOTE — Telephone Encounter (Signed)
 Called to discuss scheduling next available appointment with NP per Dr. Reola Casino and voice mail is full

## 2023-11-21 ENCOUNTER — Ambulatory Visit: Payer: Self-pay | Admitting: Cardiology

## 2023-11-21 DIAGNOSIS — R000 Tachycardia, unspecified: Secondary | ICD-10-CM

## 2023-11-22 ENCOUNTER — Telehealth (HOSPITAL_BASED_OUTPATIENT_CLINIC_OR_DEPARTMENT_OTHER): Payer: Self-pay

## 2023-11-22 NOTE — Telephone Encounter (Signed)
 Copied from CRM [id number]. Topic: Clinical - Prescription Issue >> Nov 22, 2023  3:19 PM Isabell A wrote: Reason for CRM: Heidi Llamas from [facility] calling  in regard to rescue inhaler, patient is trying to get refills on both of the following - Heidi Llamas believes she should only be on one. Heidi Llamas is requesting confirmation by Thursday - insurance will cover Albuterol  only by then.   albuterol  (VENTOLIN  HFA) 108 (90 Base) MCG/ACT inhaler   Ipratropium-Albuterol  (COMBIVENT  RESPIMAT) 20-100 MCG/ACT AERS respimat   Callback number: [phone]

## 2023-11-23 ENCOUNTER — Other Ambulatory Visit (HOSPITAL_BASED_OUTPATIENT_CLINIC_OR_DEPARTMENT_OTHER): Payer: Self-pay

## 2023-11-23 NOTE — Telephone Encounter (Signed)
 Pharmacy notified and mes list updated

## 2023-11-24 ENCOUNTER — Ambulatory Visit (HOSPITAL_COMMUNITY)
Admission: RE | Admit: 2023-11-24 | Discharge: 2023-11-24 | Disposition: A | Discharge status: Home / Self Care | Source: Ambulatory Visit | Attending: Neurosurgery | Admitting: Neurosurgery

## 2023-11-24 DIAGNOSIS — M48062 Spinal stenosis, lumbar region with neurogenic claudication: Secondary | ICD-10-CM | POA: Insufficient documentation

## 2023-11-29 ENCOUNTER — Telehealth: Payer: Self-pay | Admitting: Cardiology

## 2023-11-29 ENCOUNTER — Telehealth: Payer: Self-pay | Admitting: Pulmonary Disease

## 2023-11-29 ENCOUNTER — Other Ambulatory Visit (HOSPITAL_BASED_OUTPATIENT_CLINIC_OR_DEPARTMENT_OTHER): Payer: Self-pay | Admitting: Pulmonary Disease

## 2023-11-29 NOTE — Telephone Encounter (Signed)
 Patient's spouse is calling due to receiving the letter requesting a cb regarding heart monitor results. Please advise.

## 2023-11-29 NOTE — Telephone Encounter (Signed)
 Per Dr Benjie Brandy note he wanted her seen before her visit with him in July due to high symptom burden and needed meds frequently. Do NP not have anything sooner and then keep her appt With [facility] in july

## 2023-11-29 NOTE — Telephone Encounter (Signed)
 Copied from CRM [id number]. Topic: Appointments - Scheduling Inquiry for Clinic >> Nov 29, 2023 10:50 AM [name] wrote: Reason for CRM: Pt's spouse [name] called after receiving a letter in the mail stating that per Dr. Villa Greaser, pt needed to schedule the next available appt with a NP. Pt already has an appt scheduled for 7/14 at 115pm with Dr. Villa Greaser. When I attempted to call [facility] to ask for more information, I was told this would be an addition to the already scheduled appt, however, there's no openings with a NP until late July - August. Pt's spouse [name] said it would be best just to keep the appt with Dr. Villa Greaser. If this is needed to be scheduled, please call the pt and leave a detailed encounter stating why it is needed and what the appt is for so that e2c2 can assist. Pt's phone number is [phone] and [name]'s phone number is [phone].

## 2023-11-29 NOTE — Telephone Encounter (Signed)
 Rebekah Rebekah Canada, PA-C 11/22/2023 12:21 PM EDT     Heart monitor showed normal rhythm with average HR of 91bpm. 26 runs of SVT, longest 43.3 seconds. Can we ask how her palpitations are on the Cardizem ?   Husband reports that the palpitations are better. He does state that her Hr some mornings is 109. No other complaints.

## 2023-11-29 NOTE — Telephone Encounter (Signed)
 Everyone is booked right now. We've been trying to contact this patient for a month to schedule a sooner visit however, her VM is full and she never answers our calls. At this time, she will have to keep her appointment with Alva as no one has anything sooner.

## 2023-12-01 ENCOUNTER — Other Ambulatory Visit: Payer: Self-pay | Admitting: Internal Medicine

## 2023-12-05 ENCOUNTER — Other Ambulatory Visit (INDEPENDENT_AMBULATORY_CARE_PROVIDER_SITE_OTHER): Payer: Self-pay | Admitting: Gastroenterology

## 2023-12-08 ENCOUNTER — Ambulatory Visit (HOSPITAL_COMMUNITY): Admission: RE | Admit: 2023-12-08 | Source: Ambulatory Visit

## 2023-12-19 ENCOUNTER — Ambulatory Visit (HOSPITAL_BASED_OUTPATIENT_CLINIC_OR_DEPARTMENT_OTHER): Payer: Self-pay | Admitting: Pulmonary Disease

## 2023-12-19 MED ORDER — PREDNISONE 10 MG PO TABS: ORAL_TABLET | ORAL | 1 refills | Status: DC

## 2023-12-19 MED ORDER — DOXYCYCLINE HYCLATE 100 MG PO TABS: 100.0000 mg | ORAL_TABLET | Freq: Every day | ORAL | 0 refills | Status: DC

## 2023-12-19 NOTE — Addendum Note (Signed)
 Addended by: Kary Pages on: 12/19/2023 02:01 PM   Modules accepted: Orders

## 2023-12-19 NOTE — Telephone Encounter (Signed)
 FYI Only or Action Required?: Action required by provider  Patient is followed in Pulmonology for COPD and chronic respiratory failure, last seen on 08/17/2023 by Lind Repine, MD. Called Nurse Triage reporting Shortness of Breath, Cough, and Wheezing. Symptoms began several days ago. Interventions attempted: Rescue inhaler, Nebulizer treatments, and Home oxygen  use. Symptoms are: gradually worsening.  Triage Disposition: See HCP Within 4 Hours (Or PCP Triage)  Patient/caregiver understands and will follow disposition?: No, refuses disposition - Requesting antibx and prednisone        Copied from CRM [id number]. Topic: Clinical - Red Word Triage >> Dec 19, 2023  9:02 AM Hilton Lucky wrote: Red Word that prompted transfer to Nurse Triage: Harder and harder to breathe every day per patient, requesting a steroid to avoid getting pneumonia. Reason for Disposition  [1] Longstanding difficulty breathing (e.g., CHF, COPD, emphysema) AND [2] WORSE than normal  Answer Assessment - Initial Assessment Questions E2C2 Pulmonary Triage - Initial Assessment Questions Chief Complaint (e.g., cough, sob, wheezing, fever, chills, sweat or additional symptoms) *Go to specific symptom protocol after initial questions. Started yesterday or day before, getting worse, SOB Wheezing some No fever Cough off and on, coughing fits yesterday Worse with activity and at rest No chills, sweating, chest pain, sore throat, runny nose, dizziness, weakness Just a little bit tired Dry cough over phone Speaking in full sentences  How long have symptoms been present? Started yesterday or day  Have you used your inhalers/maintenance medication? Yes If yes, What medications? Albuterol  inhaler and nebulizer  If inhaler, ask How many puffs and how often? Note: Review instructions on medication in the chart. 4x/day each  OXYGEN : Do you wear supplemental oxygen ? Yes If yes, How many liters are you supposed to  use? 24/7, 5L, doing alright might need to go up some but don't turn it up unless doc tells me  Do you monitor your oxygen  levels? No  6. CARDIAC HISTORY: Do you have any history of heart disease? (e.g., heart attack, angina, bypass surgery, angioplasty)      Significant  7. LUNG HISTORY: Do you have any history of lung disease?  (e.g., pulmonary embolus, asthma, emphysema)     Significant  Usually takes an antibx and prednisone  to get it right, want to get before gets bad Advised call back if any worsening  Protocols used: Breathing Difficulty-A-AH

## 2023-12-19 NOTE — Telephone Encounter (Signed)
 Pt notified and rx sent to pharmacy

## 2023-12-19 NOTE — Telephone Encounter (Signed)
 Copied from CRM [id number]. Topic: General - Other >> Dec 19, 2023 11:40 AM Margarette Shawl wrote: Reason for CRM:   Pt is contacitng clinic to verify if the antibiotic and prednisone  she requested has been sent to [name]. Reviewed chart and advised her request was sent to [facility]; however, there has been no response at this time. Advised to contact clinic later today to check, due to [facility] not notifying her when medications have been filled. >> Dec 19, 2023 12:47 PM Whitney O wrote: Patient was calling back concerning the medication she was requesting dr Villa Greaser to send in . Relayed to patient that dr Villa Greaser has responded to the nurse and just give till the end of day for them to send in to pharmacy   Rx sent per previous message.

## 2023-12-21 ENCOUNTER — Other Ambulatory Visit: Payer: Self-pay | Admitting: Internal Medicine

## 2023-12-21 ENCOUNTER — Telehealth (HOSPITAL_BASED_OUTPATIENT_CLINIC_OR_DEPARTMENT_OTHER): Payer: Self-pay

## 2023-12-21 ENCOUNTER — Other Ambulatory Visit (HOSPITAL_BASED_OUTPATIENT_CLINIC_OR_DEPARTMENT_OTHER): Payer: Self-pay

## 2023-12-21 ENCOUNTER — Telehealth: Payer: Self-pay | Admitting: Pulmonary Disease

## 2023-12-21 MED ORDER — DOXYCYCLINE HYCLATE 100 MG PO TABS: 100.0000 mg | ORAL_TABLET | Freq: Every day | ORAL | 0 refills | Status: DC

## 2023-12-21 MED ORDER — ALBUTEROL SULFATE HFA 108 (90 BASE) MCG/ACT IN AERS: 1.0000 | INHALATION_SPRAY | Freq: Four times a day (QID) | RESPIRATORY_TRACT | 3 refills | Status: DC | PRN

## 2023-12-21 NOTE — Telephone Encounter (Signed)
 Please see last tel encounter (signed) about samples for PT. She can not drive and wonders if we can mail her the samples. She also must do her next appt via [facility] with Dr. Villa Greaser. I will do a recall with all this information because no sched is out for him yet. RDSVL can you please mail the sample. Not sure which one Dr. Alva meant for her.

## 2023-12-21 NOTE — Telephone Encounter (Signed)
 Patient is calling cause she is returning a call that she received . Got patient transferred over to speak with tanessa

## 2023-12-21 NOTE — Telephone Encounter (Signed)
 Pt VM full and noone in [place] office till Monday, another telephone encounter created this morning states, pt wants to do next visit in July as a video visit? Can I refill her Albuterol  for now?

## 2023-12-21 NOTE — Telephone Encounter (Signed)
 Copied from CRM [id number]. Topic: Clinical - Medication Question >> Dec 21, 2023  8:19 AM Hilton Lucky wrote: Reason for CRM: Patient is requesting a light inhaler be prescribed for her outside of Breztri  and abuterol. States she is unable to get Breztri  because it is not due yet and patient would like an inhaler for her COPD to tide her over until she can get her Breztri  again.

## 2023-12-26 ENCOUNTER — Encounter (INDEPENDENT_AMBULATORY_CARE_PROVIDER_SITE_OTHER): Admitting: Podiatry

## 2023-12-26 DIAGNOSIS — Z91199 Patient's noncompliance with other medical treatment and regimen due to unspecified reason: Secondary | ICD-10-CM

## 2023-12-26 NOTE — Telephone Encounter (Signed)
 Tried calling pt to see if she wanted a sample Breztri  but the phone call can not be completed as dialed

## 2023-12-26 NOTE — Progress Notes (Signed)
Patient did not show for scheduled appointment today.

## 2023-12-28 NOTE — Telephone Encounter (Signed)
 ATC x2 no vm

## 2023-12-29 ENCOUNTER — Other Ambulatory Visit (HOSPITAL_COMMUNITY): Payer: Self-pay | Admitting: Neurosurgery

## 2023-12-29 DIAGNOSIS — M4316 Spondylolisthesis, lumbar region: Secondary | ICD-10-CM

## 2023-12-30 ENCOUNTER — Encounter: Payer: Self-pay | Admitting: Podiatry

## 2024-01-02 ENCOUNTER — Inpatient Hospital Stay (HOSPITAL_COMMUNITY): Admission: RE | Admit: 2024-01-02 | Source: Ambulatory Visit

## 2024-01-12 ENCOUNTER — Telehealth: Payer: Self-pay

## 2024-01-12 NOTE — Telephone Encounter (Signed)
 Copied from CRM [id number]. Topic: Clinical - Prescription Issue >> Jan 09, 2024  9:27 AM Corean SAUNDERS wrote: Reason for CRM: Patient states she is out of her albuterol  inhaler and while she has refills available, her pharmacy advised her its to soon to refill. Patient states due to her COPD she has to use it a lot, and is requesting someone to help her with this matter as she is completely out.    Tried to contact patient no answer - VM full could not leave message for return call -     Due to the natural of the situation we are unable to do much for her, advise to contact insurance company or she can pay out of pocket.

## 2024-01-16 ENCOUNTER — Ambulatory Visit (HOSPITAL_BASED_OUTPATIENT_CLINIC_OR_DEPARTMENT_OTHER): Admitting: Pulmonary Disease

## 2024-01-17 ENCOUNTER — Other Ambulatory Visit (HOSPITAL_COMMUNITY)

## 2024-01-17 NOTE — Progress Notes (Unsigned)
 Cardiology Office Note    Date:  01/18/2024  ID:  [name], DOB 1955-10-12, MRN [id number] Cardiologist: Alvan Carrier, MD    History of Present Illness:    [name] is a 68 y.o. female with past medical history of coronary calcifications (by prior CT imaging with low-risk NST in 2015, NSTEMI in 01/2022 and medical management pursued given her multiple medical issues and substance abuse, catheterization readdressed in 2023 but she canceled and no showed for the procedure), chronic HFimpEF (EF 40-45% by echo in 01/2022, improved to 55-60% by echo in 04/2022), HTN, HLD, Type II DM, suspicious lung nodule (prior PET suspicious for adenocarcinoma and underwent empiric radiation therapy), cocaine use and COPD who presents to the office today for 6-week follow-up.    She was examined by myself in 09/2023 and reported still having intermittent palpitations which would occur a few days each week. She had been taking Lopressor  25 mg twice daily and reported having worsening shortness of breath with this. She was on 4 to 5 L nasal cannula 24/7 given her COPD. It was recommended to stop Lopressor  and switch to short-acting Cardizem  30 mg twice daily and if symptoms did not improve, could obtain a Zio patch.   The patient's spouse called the office later that week reporting she was having frequent tachycardia. A 7-day Zio patch was recommended. This showed predominately NSR with average HR of 91 bpm. She did have 26 episodes of SVT with the longest lasting 43.3 seconds. Noted to have rare PAC's and PVC's but less than 1% burden.   In talking with the patient and her husband today, she reports having worsening lower extremity edema for the past few days but he reports this has been occurring for several weeks. She has been on multiple steroid tapers given worsening COPD. They feel like symptoms have occurred during this timeframe. She does consume approximately 3 to 4 20-ounce bottles  of Coca-Cola on a daily basis along with 20+ ounces of water  and occasional tea. She has been taking Torsemide  and does take 40 mg daily at times and has not noticed an improvement in symptoms. Reports she previously urinated more frequently with Lasix . She has baseline dyspnea on exertion but no specific orthopnea or PND. No recent chest pain. Still experiences occasional palpitations and says these are sporadic and typically resolve within a few minutes. Denies any recent substance use.   Studies Reviewed:   EKG: EKG is not ordered today.  Echocardiogram: 09/2022 IMPRESSIONS     1. Left ventricular ejection fraction, by estimation, is 65 to 70%. The  left ventricle has normal function. The left ventricle has no regional  wall motion abnormalities. Left ventricular diastolic parameters were  normal.   2. Right ventricular systolic function is normal. The right ventricular  size is normal. Tricuspid regurgitation signal is inadequate for assessing  PA pressure.   3. The mitral valve is grossly normal. Trivial mitral valve  regurgitation.   4. The aortic valve is tricuspid. Aortic valve regurgitation is not  visualized.   5. The inferior vena cava is dilated in size with >50% respiratory  variability, suggesting right atrial pressure of 8 mmHg.   Comparison(s): Prior images reviewed side by side. LVEF vigorous at  65-70%.    Event Monitor: 10/2023   7 day monitor   Rare supraventricular ectopy in the form of isolated PACs, couplets, triplets. 26 runs of SVT longest 43 seconds.   Rare ventricular ectopy in the form  of isolated PVCs, couplets   No symptoms reported     Patch Wear Time:  6 days and 22 hours (2025-03-20T10:54:31-0400 to 2025-03-27T09:13:31-0400)   Patient had a min HR of 68 bpm, max HR of 200 bpm, and avg HR of 91 bpm. Predominant underlying rhythm was Sinus Rhythm. 26 Supraventricular Tachycardia runs occurred, the run with the fastest interval lasting 4 beats with a  max rate of 200 bpm, the  longest lasting 43.3 secs with an avg rate of 128 bpm. Isolated SVEs were rare (<1.0%), SVE Couplets were rare (<1.0%), and SVE Triplets were rare (<1.0%). Isolated VEs were rare (<1.0%), VE Couplets were rare (<1.0%), and no VE Triplets were present.    Physical Exam:   VS:  BP (!) 104/50   Pulse 96   Ht 5' 4 (1.626 m)   Wt 168 lb (76.2 kg)   SpO2 96%   BMI 28.84 kg/m    Wt Readings from Last 3 Encounters:  01/18/24 168 lb (76.2 kg)  09/13/23 165 lb 3.2 oz (74.9 kg)  09/09/23 171 lb 6.4 oz (77.7 kg)     GEN: Pleasant female appearing  in no acute distress NECK: No JVD; No carotid bruits CARDIAC: RRR, no murmurs, rubs, gallops RESPIRATORY: Expiratory wheezing along upper lung fields and scattered rhonchi. On supplemental oxygen .  ABDOMEN: Appears non-distended. No obvious abdominal masses. EXTREMITIES: No clubbing or cyanosis. 2+ pitting edema bilaterally with weeping noted along the left leg.    Assessment and Plan:   1. Palpitations/SVT - Recent monitor as outlined above showed episodes of SVT with the longest episode lasting for 43 seconds. She did have rare PAC's and PVC's but less than 1% burden. - Reports still having occasional palpitations but we reviewed that we are limited in further titration of Cardizem  given her soft BP.  Would continue short-acting Cardizem  30 mg twice daily for now. This has been preferred over beta-blocker therapy given her oxygen  dependent COPD. Also reviewed the importance of reducing her caffeine consumption as she consumes 3-4 regular sodas on a daily basis.  2. CAD/HLD - She had a medically managed NSTEMI in 2023 given her medical issues and substance use at that time. A cardiac catheterization was later reviewed but she declined. Medical management was recommended earlier this year when her case was reviewed with Dr. Alvan given her multiple medical issues. - She has baseline dyspnea on exertion in the setting of  COPD but denies any recent chest pain. Continue Crestor  10 mg daily as LDL was at 31 when checked in 08/2023.   3. Chronic HFimpEF - Her ejection fraction was at 40 to 45% in 2023 and had normalized to 65 to 70% by repeat echocardiogram in 09/2022. She has significant pitting edema on examination today with weeping as discussed above. Suspect this is secondary to recent steroid use and also due to her excessive fluid intake. - Given her significant volume overload, I did recommend ED evaluation with likely admission for IV diuresis and she declines at this time. Says that she previously diuresed more with Lasix  and prefers to try this initially. Therefore, we will stop Torsemide  and switch to Lasix  40 mg daily. Recheck BNP and BMET in 1 week. We reviewed that if symptoms do not improve within the next few days, she should proceed to the ED for likely admission and IV diuresis. Would repeat an echo. Also reviewed the importance of reducing fluid intake to approximately 2 L/day.   4. HTN - BP is  at 104/50 during today's visit. Continue short-acting Cardizem  30 mg twice daily for palpitations as discussed above.   5. COPD/Lung Cancer - She is on 4 to 5 L nasal cannula at baseline and followed by Pulmonology. Previously underwent empiric radiation for presumed lung cancer as she was felt to be too high-risk for surgery.  Signed, Laymon CHRISTELLA Qua, PA-C

## 2024-01-18 ENCOUNTER — Ambulatory Visit: Attending: Student | Admitting: Student

## 2024-01-18 ENCOUNTER — Encounter: Payer: Self-pay | Admitting: Student

## 2024-01-18 VITALS — BP 104/50 | HR 96 | Ht 64.0 in | Wt 168.0 lb

## 2024-01-18 DIAGNOSIS — I471 Supraventricular tachycardia, unspecified: Secondary | ICD-10-CM | POA: Diagnosis not present

## 2024-01-18 DIAGNOSIS — E785 Hyperlipidemia, unspecified: Secondary | ICD-10-CM

## 2024-01-18 DIAGNOSIS — I5032 Chronic diastolic (congestive) heart failure: Secondary | ICD-10-CM

## 2024-01-18 DIAGNOSIS — I2510 Atherosclerotic heart disease of native coronary artery without angina pectoris: Secondary | ICD-10-CM | POA: Diagnosis not present

## 2024-01-18 DIAGNOSIS — I10 Essential (primary) hypertension: Secondary | ICD-10-CM

## 2024-01-18 DIAGNOSIS — Z79899 Other long term (current) drug therapy: Secondary | ICD-10-CM

## 2024-01-18 DIAGNOSIS — J449 Chronic obstructive pulmonary disease, unspecified: Secondary | ICD-10-CM

## 2024-01-18 MED ORDER — FUROSEMIDE 40 MG PO TABS: 40.0000 mg | ORAL_TABLET | Freq: Every day | ORAL | 3 refills | Status: DC

## 2024-01-18 MED ORDER — FUROSEMIDE 40 MG PO TABS: 40.0000 mg | ORAL_TABLET | Freq: Every day | ORAL | 3 refills | Status: AC

## 2024-01-18 NOTE — Patient Instructions (Signed)
 Medication Instructions:  Your physician has recommended you make the following change in your medication:   Stop Taking Torsemide   Start Lasix  40 mg Daily   *If you need a refill on your cardiac medications before your next appointment, please call your pharmacy*  Lab Work: Your physician recommends that you return for lab work in: 1 Week ( BMET, BNP)   If you have labs (blood work) drawn today and your tests are completely normal, you will receive your results only by: MyChart Message (if you have MyChart) OR A paper copy in the mail If you have any lab test that is abnormal or we need to change your treatment, we will call you to review the results.  Testing/Procedures: NONE   Follow-Up: At [facility], you and your health needs are our priority.  As part of our continuing mission to provide you with exceptional heart care, our providers are all part of one team.  This team includes your primary Cardiologist (physician) and Advanced Practice Providers or APPs (Physician Assistants and Nurse Practitioners) who all work together to provide you with the care you need, when you need it.  Your next appointment:   4-6 week(s)  Provider:   You may see Alvan Carrier, MD or one of the following Advanced Practice Providers on your designated Care Team:   Laymon Qua, PA-C  Scotesia Everly, [place] Olivia Pavy, [place]    We recommend signing up for the patient portal called MyChart.  Sign up information is provided on this After Visit Summary.  MyChart is used to connect with patients for Virtual Visits (Telemedicine).  Patients are able to view lab/test results, encounter notes, upcoming appointments, etc.  Non-urgent messages can be sent to your provider as well.   To learn more about what you can do with MyChart, go to [email or web address].   Other Instructions Thank you for choosing [facility]!

## 2024-01-19 ENCOUNTER — Ambulatory Visit (HOSPITAL_BASED_OUTPATIENT_CLINIC_OR_DEPARTMENT_OTHER): Admitting: Pulmonary Disease

## 2024-01-23 ENCOUNTER — Encounter (INDEPENDENT_AMBULATORY_CARE_PROVIDER_SITE_OTHER): Admitting: Podiatry

## 2024-01-23 DIAGNOSIS — Z91199 Patient's noncompliance with other medical treatment and regimen due to unspecified reason: Secondary | ICD-10-CM

## 2024-01-23 NOTE — Progress Notes (Signed)
Patient did not show for scheduled appointment today.

## 2024-02-03 ENCOUNTER — Ambulatory Visit (HOSPITAL_COMMUNITY)

## 2024-02-03 ENCOUNTER — Telehealth (HOSPITAL_BASED_OUTPATIENT_CLINIC_OR_DEPARTMENT_OTHER): Payer: Self-pay

## 2024-02-03 ENCOUNTER — Inpatient Hospital Stay (HOSPITAL_COMMUNITY): Admission: RE | Admit: 2024-02-03 | Source: Ambulatory Visit

## 2024-02-03 NOTE — Telephone Encounter (Signed)
 Mailbox is full and unable to accept messages at this time, so unable to speak with pt

## 2024-02-03 NOTE — Telephone Encounter (Signed)
 Copied from CRM [id number]. Topic: Clinical - Prescription Issue >> Feb 03, 2024 11:17 AM [name] wrote: Reason for CRM: PT CALLED STATED SHE IS OUT OF albuterol  (VENTOLIN  HFA) 108 (90 Base) MCG/ACT inhaler AND CAN NOT GET PRESCRIPTION FILLED BEFORE MONDAY WITHOUT INSURANCE CHARGING HER OVER 600 DOLLARS BECAUSE IT IS TO EARLY TO REFIILL. SHE STATED SHE IS OUT OF THE MED AND WANTS SOME TYPE OF EMER INHALER UNTIL THEN

## 2024-02-03 NOTE — Telephone Encounter (Signed)
-   Make sure she is using Breztri  two puffs every 12 hours - Nothing we can do about the cost of Albuterol  rescue inhaler - We can send in albuterol  nebulizer solution to use every 4-6 hours for breakthrough symptoms  - She is overdue for follow-up with Dr. Jude or APP

## 2024-02-07 NOTE — Progress Notes (Signed)
 This encounter was created in error - please disregard.

## 2024-02-14 ENCOUNTER — Other Ambulatory Visit (HOSPITAL_BASED_OUTPATIENT_CLINIC_OR_DEPARTMENT_OTHER): Payer: Self-pay | Admitting: Pulmonary Disease

## 2024-02-16 ENCOUNTER — Other Ambulatory Visit (HOSPITAL_BASED_OUTPATIENT_CLINIC_OR_DEPARTMENT_OTHER): Payer: Self-pay | Admitting: Pulmonary Disease

## 2024-02-17 ENCOUNTER — Ambulatory Visit (HOSPITAL_COMMUNITY)
Admission: RE | Admit: 2024-02-17 | Discharge: 2024-02-17 | Disposition: A | Discharge status: Home / Self Care | Source: Ambulatory Visit | Attending: Adult Health | Admitting: Adult Health

## 2024-02-17 ENCOUNTER — Ambulatory Visit (HOSPITAL_COMMUNITY)
Admission: RE | Admit: 2024-02-17 | Discharge: 2024-02-17 | Disposition: A | Discharge status: Home / Self Care | Source: Ambulatory Visit | Attending: Neurosurgery | Admitting: Neurosurgery

## 2024-02-17 ENCOUNTER — Other Ambulatory Visit (HOSPITAL_BASED_OUTPATIENT_CLINIC_OR_DEPARTMENT_OTHER): Payer: Self-pay | Admitting: Pulmonary Disease

## 2024-02-17 DIAGNOSIS — M4316 Spondylolisthesis, lumbar region: Secondary | ICD-10-CM | POA: Insufficient documentation

## 2024-02-17 DIAGNOSIS — Z1231 Encounter for screening mammogram for malignant neoplasm of breast: Secondary | ICD-10-CM | POA: Insufficient documentation

## 2024-02-17 DIAGNOSIS — Z13820 Encounter for screening for osteoporosis: Secondary | ICD-10-CM | POA: Diagnosis not present

## 2024-02-17 DIAGNOSIS — M810 Age-related osteoporosis without current pathological fracture: Secondary | ICD-10-CM | POA: Diagnosis not present

## 2024-02-17 DIAGNOSIS — Z780 Asymptomatic menopausal state: Secondary | ICD-10-CM | POA: Insufficient documentation

## 2024-02-22 ENCOUNTER — Other Ambulatory Visit (HOSPITAL_BASED_OUTPATIENT_CLINIC_OR_DEPARTMENT_OTHER): Payer: Self-pay | Admitting: Pulmonary Disease

## 2024-02-22 ENCOUNTER — Other Ambulatory Visit: Payer: Self-pay | Admitting: Pulmonary Disease

## 2024-02-22 MED ORDER — ALBUTEROL SULFATE HFA 108 (90 BASE) MCG/ACT IN AERS: 1.0000 | INHALATION_SPRAY | Freq: Four times a day (QID) | RESPIRATORY_TRACT | 3 refills | Status: AC | PRN

## 2024-02-22 NOTE — Telephone Encounter (Signed)
 Copied from CRM [id number]. Topic: Clinical - Medication Refill >> Feb 22, 2024  9:08 AM [name] wrote: Medication: albuterol  (VENTOLIN  HFA) 108 (90 Base) MCG/ACT inhaler  Patient states she is taking at least 10 puffs daily and is running out of her inhaler to fast.     Has the patient contacted their pharmacy? Yes (Agent: If no, request that the patient contact the pharmacy for the refill. If patient does not wish to contact the pharmacy document the reason why and proceed with request.) (Agent: If yes, when and what did the pharmacy advise?)  This is the patient's preferred pharmacy:  [facility] - [place], [place] - [address] [place] [address] Phone: [phone] Fax: [phone]  Is this the correct pharmacy for this prescription? Yes If no, delete pharmacy and type the correct one.   Has the prescription been filled recently? Yes  Is the patient out of the medication? Yes  Has the patient been seen for an appointment in the last year OR does the patient have an upcoming appointment? Yes  Can we respond through MyChart? No  Agent: Please be advised that Rx refills may take up to 3 business days. We ask that you follow-up with your pharmacy.

## 2024-02-23 ENCOUNTER — Ambulatory Visit: Admitting: Nurse Practitioner

## 2024-02-28 NOTE — Telephone Encounter (Signed)
Please advise on rf 

## 2024-03-01 ENCOUNTER — Ambulatory Visit (INDEPENDENT_AMBULATORY_CARE_PROVIDER_SITE_OTHER): Admitting: Podiatry

## 2024-03-01 ENCOUNTER — Encounter: Payer: Self-pay | Admitting: Podiatry

## 2024-03-01 DIAGNOSIS — M79674 Pain in right toe(s): Secondary | ICD-10-CM

## 2024-03-01 DIAGNOSIS — B351 Tinea unguium: Secondary | ICD-10-CM

## 2024-03-01 DIAGNOSIS — E1142 Type 2 diabetes mellitus with diabetic polyneuropathy: Secondary | ICD-10-CM | POA: Diagnosis not present

## 2024-03-01 DIAGNOSIS — I739 Peripheral vascular disease, unspecified: Secondary | ICD-10-CM | POA: Diagnosis not present

## 2024-03-01 DIAGNOSIS — L84 Corns and callosities: Secondary | ICD-10-CM

## 2024-03-01 NOTE — Progress Notes (Signed)
  Subjective:  Patient ID: [name], female    DOB: [birth year],  MRN: [id number]  Chief Complaint  Patient presents with   Diabetes    Pt here for [facility] and nail trim.  Bilateral callus at end 2nd toe.  Scabbed sore top of R meta.  A1c 5.8  No anti coag    68 y.o. female presents with the above complaint. History confirmed with patient. Patient presenting with pain related to dystrophic thickened elongated nails. Patient is unable to trim own nails related to nail dystrophy and/or mobility issues. Patient does have a history of T2DM. Patient does have callus present located at the distal right second toe causing pain.  Patient is on home oxygen  and reports ongoing tobacco use. Her husband notes a scab to the dorsal right foot which has been slow to heal for several weeks.  Objective:  Physical Exam: warm, capillary refill 3 to 5 seconds to the digits, decreased skin turgor texture and tone, atrophic.  Diminished pedal hair growth nail exam onychomycosis of the toenails, onycholysis, and dystrophic nails DP pulses diminished faintly palpable, PT pulses diminished faintly palpable, and protective sensation absent.  Reports subjective neuropathic pain Left Foot:  Pain with palpation of nails due to elongation and dystrophic growth.  Right Foot: Pain with palpation of nails due to elongation and dystrophic growth.  Pre-ulcerative callus present distal aspect of second toe causing pain Mild Flexible Hammertoe contractures of lesser digits noted Assessment:   1. PAD (peripheral artery disease) (HCC)   2. Pain due to onychomycosis of toenail of right foot   3. Polyneuropathy due to type 2 diabetes mellitus (HCC)   4. Callus      Plan:  Patient was evaluated and treated and all questions answered.  #Hyperkeratotic lesions/pre ulcerative calluses present right second toe distal aspect All symptomatic hyperkeratoses x 1 separate lesions were safely debrided as a courtesy with a  sterile #312 blade to patient's level of comfort without incident. We discussed preventative and palliative care of these lesions including supportive and accommodative shoegear, padding, prefabricated and custom molded accommodative orthoses, use of a pumice stone and lotions/creams daily. -This is associated with patient's hammertoe deformity -Crest hammertoe pads x 2 dispensed  #Onychomycosis with pain  -Nails palliatively debrided as below. -Educated on self-care  Procedure: Nail Debridement Rationale: Pain Type of Debridement: manual, sharp debridement. Instrumentation: Nail nipper, rotary burr. Number of Nails: 10  # Diabetes with neuropathy # PAD Patient educated on diabetes. Discussed proper diabetic foot care and discussed risks and complications of disease. Educated patient in depth on reasons to return to the office immediately should he/she discover anything concerning or new on the feet. All questions answered. Discussed proper shoes as well.  - Reports pain in legs with short periods of ambulation, could be due to neuropathy or back pain as well however do have some concerns about claudication - Ordering new baseline ABI -Discussed importance of smoking cessation  Return in about 3 months (around 06/01/2024) for Diabetic Foot Care.         Ethan Saddler, DPM [facility] / [facility]

## 2024-03-01 NOTE — Patient Instructions (Signed)
 More silicone pads can be purchased from:  [email or web address]  Look for crest pads for hammertoes.  These can also be found on amazon.com or from [email or web address].  They also come in whether or moleskin type material in addition to the silicone.

## 2024-03-04 NOTE — Progress Notes (Deleted)
 Cardiology Office Note    Date:  03/04/2024  ID:  Caresse, Sedivy 1955/09/29, MRN [id number] Cardiologist: Alvan Carrier, MD Cardiology APP:  Johnson Laymon HERO, PA-C { :  History of Present Illness:    [name] is a 68 y.o. female with past medical history of coronary calcifications (by prior CT imaging with low-risk NST in 2015, NSTEMI in 01/2022 and medical management pursued given her multiple medical issues and substance abuse, catheterization readdressed in 2023 but she canceled and no showed for the procedure), chronic HFimpEF (EF 40-45% by echo in 01/2022, improved to 55-60% by echo in 04/2022), HTN, HLD, Type II DM, suspicious lung nodule (prior PET suspicious for adenocarcinoma and underwent empiric radiation therapy), cocaine use and COPD who presents to the office today for follow-up.   She was last examined by myself in 01/2024 and reported having worsening lower extremity edema over the past several weeks and had been on multiple steroid tapers due to worsening COPD. She was also consuming 60 to 80 ounces of Coca-Cola along with water  and tea. She had significant volume overload by examination and was encouraged to go to the ED for admission and IV diuresis but she declined. Says that she previously urinated much more frequently with Lasix  as compared to Torsemide , therefore Torsemide  was discontinued and switched to Lasix  40 mg daily with plans for a BNP and BMET in 1 week. If no improvement, would then review ED evaluation again. She never had follow-up labs.   - BNP/BMET!!   ROS: ***  Studies Reviewed:   EKG: EKG is*** ordered today and demonstrates ***   EKG Interpretation Date/Time:    Ventricular Rate:    PR Interval:    QRS Duration:    QT Interval:    QTC Calculation:   R Axis:      Text Interpretation:         Echocardiogram: 09/2022 IMPRESSIONS     1. Left ventricular ejection fraction, by estimation, is 65 to 70%. The   left ventricle has normal function. The left ventricle has no regional  wall motion abnormalities. Left ventricular diastolic parameters were  normal.   2. Right ventricular systolic function is normal. The right ventricular  size is normal. Tricuspid regurgitation signal is inadequate for assessing  PA pressure.   3. The mitral valve is grossly normal. Trivial mitral valve  regurgitation.   4. The aortic valve is tricuspid. Aortic valve regurgitation is not  visualized.   5. The inferior vena cava is dilated in size with >50% respiratory  variability, suggesting right atrial pressure of 8 mmHg.   Comparison(s): Prior images reviewed side by side. LVEF vigorous at  65-70%. '  Event Monitor: 09/2023   7 day monitor   Rare supraventricular ectopy in the form of isolated PACs, couplets, triplets. 26 runs of SVT longest 43 seconds.   Rare ventricular ectopy in the form of isolated PVCs, couplets   No symptoms reported     Patch Wear Time:  6 days and 22 hours (2025-03-20T10:54:31-0400 to 2025-03-27T09:13:31-0400)   Patient had a min HR of 68 bpm, max HR of 200 bpm, and avg HR of 91 bpm. Predominant underlying rhythm was Sinus Rhythm. 26 Supraventricular Tachycardia runs occurred, the run with the fastest interval lasting 4 beats with a max rate of 200 bpm, the  longest lasting 43.3 secs with an avg rate of 128 bpm. Isolated SVEs were rare (<1.0%), SVE Couplets were rare (<1.0%), and SVE Triplets  were rare (<1.0%). Isolated VEs were rare (<1.0%), VE Couplets were rare (<1.0%), and no VE Triplets were present.   Risk Assessment/Calculations:   {Does this patient have ATRIAL FIBRILLATION?[phone]} No BP recorded.  {Refresh Note OR Click here to enter BP  :1}***         Physical Exam:   VS:  There were no vitals taken for this visit.   Wt Readings from Last 3 Encounters:  01/18/24 168 lb (76.2 kg)  09/13/23 165 lb 3.2 oz (74.9 kg)  09/09/23 171 lb 6.4 oz (77.7 kg)     GEN:  Well nourished, well developed in no acute distress NECK: No JVD; No carotid bruits CARDIAC: ***RRR, no murmurs, rubs, gallops RESPIRATORY:  Clear to auscultation without rales, wheezing or rhonchi  ABDOMEN: Appears non-distended. No obvious abdominal masses. EXTREMITIES: No clubbing or cyanosis. No edema.  Distal pedal pulses are 2+ bilaterally.   Assessment and Plan:   1. Heart failure with improved ejection fraction (HFimpEF) (HCC) - Her EF was previously 40 to 45% in 2023 and had normalized by repeat imaging. Was at 9 to 70% by most recent echocardiogram in 09/2022.  2. Coronary artery disease involving native coronary artery of native heart without angina pectoris - She did have an NSTEMI in 2023 and this was medically managed at that time given her multiple medical issues and substance use. Cardiac catheterization was later reviewed as an outpatient but she declined.  3. SVT (supraventricular tachycardia) (HCC) ***  4. Essential hypertension - BP is at***during today's visit.  5. Chronic obstructive pulmonary disease, unspecified COPD type (HCC) - Followed by Pulmonology and she is on 4 to 5 L nasal cannula. Regena a history of lung cancer and underwent empiric radiation as she was felt to be too high-risk for surgery.  Signed, Laymon CHRISTELLA Qua, PA-C

## 2024-03-07 ENCOUNTER — Ambulatory Visit: Attending: Student | Admitting: Student

## 2024-03-07 DIAGNOSIS — I10 Essential (primary) hypertension: Secondary | ICD-10-CM

## 2024-03-07 DIAGNOSIS — I5032 Chronic diastolic (congestive) heart failure: Secondary | ICD-10-CM

## 2024-03-07 DIAGNOSIS — I2510 Atherosclerotic heart disease of native coronary artery without angina pectoris: Secondary | ICD-10-CM

## 2024-03-07 DIAGNOSIS — J449 Chronic obstructive pulmonary disease, unspecified: Secondary | ICD-10-CM

## 2024-03-07 DIAGNOSIS — I471 Supraventricular tachycardia, unspecified: Secondary | ICD-10-CM

## 2024-03-13 ENCOUNTER — Encounter (HOSPITAL_COMMUNITY): Payer: Self-pay

## 2024-03-22 ENCOUNTER — Ambulatory Visit (HOSPITAL_COMMUNITY)
Admission: RE | Admit: 2024-03-22 | Discharge: 2024-03-22 | Disposition: A | Discharge status: Home / Self Care | Source: Ambulatory Visit | Attending: Podiatry | Admitting: Podiatry

## 2024-03-22 DIAGNOSIS — I739 Peripheral vascular disease, unspecified: Secondary | ICD-10-CM | POA: Diagnosis not present

## 2024-03-23 ENCOUNTER — Ambulatory Visit: Payer: Self-pay | Admitting: Podiatry

## 2024-03-23 LAB — VAS US ABI WITH/WO TBI
Left ABI: 1.3
Right ABI: 1.23

## 2024-03-26 ENCOUNTER — Ambulatory Visit (HOSPITAL_BASED_OUTPATIENT_CLINIC_OR_DEPARTMENT_OTHER): Admitting: Pulmonary Disease

## 2024-03-29 ENCOUNTER — Telehealth (HOSPITAL_BASED_OUTPATIENT_CLINIC_OR_DEPARTMENT_OTHER): Payer: Self-pay | Admitting: Pulmonary Disease

## 2024-03-29 NOTE — Telephone Encounter (Signed)
 Copied from CRM [id number]. Topic: Appointments - Scheduling Inquiry for Clinic >> Mar 29, 2024 11:14 AM Russell PARAS wrote: Reason for CRM:   Pt reached out to reschedule No Show appt on 03/26/2024. She reports her mother-in-law passed away and forgot she had an appt scheduled. Note on the 9/22 appt advised NOT TO RESCHEDULE due to multiple no shows/late cancellations.   Requested call back   CB#   Received message from Dr Jude on 9/22 requesting to not reschedule patient as she is a frequent no show/late cancellation. Looking through the multiple encounters in her chart, neither clinical nor front staff is able to reach her to advise on meds or visits due to a full voicemail. Dr Jude will need to formally dismiss from the practice to prevent patient from rescheduling. Dr Jude, please review her chart and advise on what you'd like to do.

## 2024-03-30 NOTE — Telephone Encounter (Signed)
 Spoke with patient. She is scheduled for 10/23 with Dr Jude. She wrote down appointment date/time as well as the address. Informed patient that if she did not attend this appointment he would no longer fill medications and that she will be dismissed from his care. Patient verbalized understanding. No show letter for all prior missed appointments have been sent as they are sent day of the no show. This has [facility] no show policy printed on them. Nothing further needed.

## 2024-04-01 ENCOUNTER — Other Ambulatory Visit: Payer: Self-pay | Admitting: Nurse Practitioner

## 2024-04-06 ENCOUNTER — Other Ambulatory Visit: Payer: Self-pay | Admitting: Pulmonary Disease

## 2024-04-06 NOTE — Telephone Encounter (Signed)
Please advise on Nystatin refill.

## 2024-04-24 ENCOUNTER — Ambulatory Visit (HOSPITAL_COMMUNITY)

## 2024-04-26 ENCOUNTER — Ambulatory Visit (HOSPITAL_BASED_OUTPATIENT_CLINIC_OR_DEPARTMENT_OTHER): Admitting: Pulmonary Disease

## 2024-04-26 ENCOUNTER — Encounter (HOSPITAL_BASED_OUTPATIENT_CLINIC_OR_DEPARTMENT_OTHER): Payer: Self-pay | Admitting: Pulmonary Disease

## 2024-04-26 NOTE — Telephone Encounter (Signed)
 Patient no showed scheduled appointment again. She is well aware of the no show policy as she has received letters after every missed appointment. Please advise how you would like to proceed.

## 2024-04-30 ENCOUNTER — Encounter: Payer: Self-pay | Admitting: *Deleted

## 2024-04-30 NOTE — Telephone Encounter (Signed)
 I have marked patient as dismissed, [name] or [name] can you send the certified letter?

## 2024-04-30 NOTE — Telephone Encounter (Signed)
 I went ahead and printed the letter, but it will need Dr. Cyndi signature before we send to medical records.

## 2024-05-01 NOTE — Telephone Encounter (Signed)
 Jamie, Dr Jude will need to sign the letter for dismissal and then it needs to be scanned over to medical records. Will you please print it and have him sign on Friday 10/31 at [facility]? Thanks!

## 2024-05-02 ENCOUNTER — Other Ambulatory Visit: Payer: Self-pay | Admitting: *Deleted

## 2024-05-02 DIAGNOSIS — E119 Type 2 diabetes mellitus without complications: Secondary | ICD-10-CM

## 2024-05-02 DIAGNOSIS — Z7984 Long term (current) use of oral hypoglycemic drugs: Secondary | ICD-10-CM

## 2024-05-02 DIAGNOSIS — I10 Essential (primary) hypertension: Secondary | ICD-10-CM

## 2024-05-02 NOTE — Telephone Encounter (Signed)
 Letter printed will await Dr Jude sig

## 2024-05-04 ENCOUNTER — Ambulatory Visit (HOSPITAL_COMMUNITY): Attending: Pulmonary Disease

## 2024-05-07 NOTE — Telephone Encounter (Signed)
 Letter signed and given to front desk  to scan

## 2024-05-11 ENCOUNTER — Other Ambulatory Visit: Payer: Self-pay | Admitting: Pulmonary Disease

## 2024-05-15 ENCOUNTER — Other Ambulatory Visit: Payer: Self-pay | Admitting: Radiation Oncology

## 2024-05-15 DIAGNOSIS — C3412 Malignant neoplasm of upper lobe, left bronchus or lung: Secondary | ICD-10-CM

## 2024-05-17 ENCOUNTER — Other Ambulatory Visit: Payer: Self-pay

## 2024-05-17 ENCOUNTER — Emergency Department (HOSPITAL_COMMUNITY)
Admission: EM | Admit: 2024-05-17 | Discharge: 2024-05-18 | Disposition: A | Discharge status: Home / Self Care | Attending: Emergency Medicine | Admitting: Emergency Medicine

## 2024-05-17 ENCOUNTER — Encounter (HOSPITAL_COMMUNITY): Payer: Self-pay

## 2024-05-17 DIAGNOSIS — Z7984 Long term (current) use of oral hypoglycemic drugs: Secondary | ICD-10-CM | POA: Diagnosis not present

## 2024-05-17 DIAGNOSIS — T40604A Poisoning by unspecified narcotics, undetermined, initial encounter: Secondary | ICD-10-CM

## 2024-05-17 DIAGNOSIS — J449 Chronic obstructive pulmonary disease, unspecified: Secondary | ICD-10-CM | POA: Insufficient documentation

## 2024-05-17 DIAGNOSIS — E1142 Type 2 diabetes mellitus with diabetic polyneuropathy: Secondary | ICD-10-CM | POA: Diagnosis not present

## 2024-05-17 DIAGNOSIS — I110 Hypertensive heart disease with heart failure: Secondary | ICD-10-CM | POA: Diagnosis not present

## 2024-05-17 DIAGNOSIS — T402X4A Poisoning by other opioids, undetermined, initial encounter: Secondary | ICD-10-CM | POA: Insufficient documentation

## 2024-05-17 DIAGNOSIS — Z7951 Long term (current) use of inhaled steroids: Secondary | ICD-10-CM | POA: Diagnosis not present

## 2024-05-17 DIAGNOSIS — I5030 Unspecified diastolic (congestive) heart failure: Secondary | ICD-10-CM | POA: Insufficient documentation

## 2024-05-17 DIAGNOSIS — F17210 Nicotine dependence, cigarettes, uncomplicated: Secondary | ICD-10-CM | POA: Diagnosis not present

## 2024-05-17 DIAGNOSIS — T50904A Poisoning by unspecified drugs, medicaments and biological substances, undetermined, initial encounter: Secondary | ICD-10-CM | POA: Diagnosis present

## 2024-05-17 DIAGNOSIS — X58XXXA Exposure to other specified factors, initial encounter: Secondary | ICD-10-CM | POA: Diagnosis not present

## 2024-05-17 DIAGNOSIS — D649 Anemia, unspecified: Secondary | ICD-10-CM | POA: Insufficient documentation

## 2024-05-17 DIAGNOSIS — Z85118 Personal history of other malignant neoplasm of bronchus and lung: Secondary | ICD-10-CM | POA: Insufficient documentation

## 2024-05-17 LAB — URINE DRUG SCREEN
Amphetamines: NEGATIVE
Barbiturates: NEGATIVE
Benzodiazepines: NEGATIVE
Cocaine: POSITIVE — AB
Fentanyl: NEGATIVE
Methadone Scn, Ur: NEGATIVE
Opiates: NEGATIVE
Tetrahydrocannabinol: NEGATIVE

## 2024-05-17 LAB — CBC WITH DIFFERENTIAL/PLATELET
Abs Immature Granulocytes: 0.02 K/uL (ref 0.00–0.07)
Basophils Absolute: 0 K/uL (ref 0.0–0.1)
Basophils Relative: 0 %
Eosinophils Absolute: 0.2 K/uL (ref 0.0–0.5)
Eosinophils Relative: 2 %
HCT: 31.8 % — ABNORMAL LOW (ref 36.0–46.0)
Hemoglobin: 10 g/dL — ABNORMAL LOW (ref 12.0–15.0)
Immature Granulocytes: 0 %
Lymphocytes Relative: 15 %
Lymphs Abs: 1.2 K/uL (ref 0.7–4.0)
MCH: 30.3 pg (ref 26.0–34.0)
MCHC: 31.4 g/dL (ref 30.0–36.0)
MCV: 96.4 fL (ref 80.0–100.0)
Monocytes Absolute: 1 K/uL (ref 0.1–1.0)
Monocytes Relative: 12 %
Neutro Abs: 5.6 K/uL (ref 1.7–7.7)
Neutrophils Relative %: 71 %
Platelets: 275 K/uL (ref 150–400)
RBC: 3.3 MIL/uL — ABNORMAL LOW (ref 3.87–5.11)
RDW: 15.5 % (ref 11.5–15.5)
WBC: 8.1 K/uL (ref 4.0–10.5)
nRBC: 0 % (ref 0.0–0.2)

## 2024-05-17 LAB — BASIC METABOLIC PANEL WITH GFR
Anion gap: 7 (ref 5–15)
BUN: 14 mg/dL (ref 8–23)
CO2: 33 mmol/L — ABNORMAL HIGH (ref 22–32)
Calcium: 10.2 mg/dL (ref 8.9–10.3)
Chloride: 97 mmol/L — ABNORMAL LOW (ref 98–111)
Creatinine, Ser: 0.5 mg/dL (ref 0.44–1.00)
GFR, Estimated: >60 mL/min (ref >60)
Glucose, Bld: 100 mg/dL — ABNORMAL HIGH (ref 70–99)
Potassium: 3.7 mmol/L (ref 3.5–5.1)
Sodium: 136 mmol/L (ref 135–145)

## 2024-05-17 LAB — ETHANOL: Alcohol, Ethyl (B): <15 mg/dL (ref <15)

## 2024-05-17 MED ORDER — SODIUM CHLORIDE 0.9 % IV BOLUS
1000.0000 mL | Freq: Once | INTRAVENOUS | Status: AC
  Administered 2024-05-17: 1000 mL via INTRAVENOUS

## 2024-05-17 MED ORDER — NALOXONE HCL 4 MG/0.1ML NA LIQD: 1.0000 | Freq: Once | NASAL | 0 refills | Status: AC | PRN

## 2024-05-17 NOTE — ED Provider Notes (Signed)
 Care assumed from Dr. Francesca, patient with apparent opioid overdose, needs observation until 4 hours after naloxone  administration.  2:07 AM Patient awake and alert, maintaining adequate oxygen  saturation on the oxygen  flow rate that she is on at home.  She is safe for discharge.   Raford Lenis, MD 05/18/24 [phone]

## 2024-05-17 NOTE — ED Notes (Signed)
 Normal saline given, pt sleep at this time will continue to monitor pt.

## 2024-05-17 NOTE — ED Provider Notes (Signed)
 [place] EMERGENCY DEPARTMENT AT [facility] Provider Note  CSN: [id number] Arrival date & time: 05/17/24 2025  Chief Complaint(s) Drug Overdose  HPI [name] is a 68 y.o. female history of CHF, COPD, bipolar disorder, on chronic home oxygen , polysubstance abuse presenting to the emergency department with overdose.  Husband called paramedics as the patient was unresponsive.  She was hypopneic and had pinpoint pupils.  They gave 2 mg of Narcan  and she returned to baseline.  Patient endorses alcohol use today as well as smoking substance, reports they told me it was marijuana.  EMS report it was crack.  Patient otherwise denies complaints.  Feels at baseline.  No fevers or chills, headaches, nausea or vomiting, abdominal pain, chest pain, difficulty breathing.   Past Medical History Past Medical History:  Diagnosis Date   Anemia    Anxiety    Arthritis    Bipolar 1 disorder (HCC)    CHF (congestive heart failure) (HCC)    COPD (chronic obstructive pulmonary disease) (HCC)    Depression    Diabetes mellitus without complication (HCC)    Dyspnea    Emphysema of lung (HCC)    GERD (gastroesophageal reflux disease)    Headache    migraines   History of hiatal hernia    History of kidney stones    Hyperlipidemia    Hypertension    Neuromuscular disorder (HCC)    neuropathy   On home oxygen  therapy    Pneumonia 2015, 2019   Tracheomalacia    Vaginal Pap smear, abnormal    Patient Active Problem List   Diagnosis Date Noted   COPD with acute exacerbation (HCC) 06/26/2023   Chronic heart failure with preserved ejection fraction (HFpEF) (HCC) 06/26/2023   Acute on chronic respiratory failure with hypoxia and hypercapnia (HCC) 05/26/2023   Wounds and injuries 04/18/2023   SIRS (systemic inflammatory response syndrome) (HCC) 04/16/2023   Chest pain 07/23/2022   Insomnia 07/23/2022   Atherosclerosis of coronary artery without angina pectoris 07/23/2022    Malignant neoplasm of upper lobe of left lung (HCC) 05/31/2022   Parotid nodule 05/31/2022   Oxygen  dependent 04/06/2022   Abnormal finding on evaluation procedure 04/06/2022   Breast mass 03/30/2022   Diastolic CHF (HCC) 02/19/2022   Oral candidiasis 02/19/2022   Angina pectoris 02/18/2022   Acute myocardial infarction (HCC) 02/11/2022   Muscle weakness 02/11/2022   Pressure injury of skin 01/03/2022   Hyponatremia 01/02/2022   Elevated troponin 01/02/2022   Leukocytosis 01/02/2022   NSTEMI (non-ST elevated myocardial infarction) (HCC) 01/02/2022   Disorder of trachea or bronchus 12/03/2021   Vitamin D deficiency 11/26/2021   Hypercalcemia 08/17/2021   Cigarette smoker 05/04/2021   Encounter for gynecological examination with Papanicolaou smear of cervix 12/31/2020   Encounter for screening fecal occult blood testing 12/31/2020   Dysuria 11/29/2020   Hallucinations 11/03/2020   Major depressive disorder, single episode, unspecified 11/03/2020   Other long term (current) drug therapy 11/03/2020   Severe recurrent major depression without psychotic features (HCC) 11/03/2020   Airway malacia 09/02/2020   Choking episode 09/02/2020   Chronic hypoxemic respiratory failure (HCC) 09/02/2020   Polyneuropathy due to type 2 diabetes mellitus (HCC) 07/11/2020   Hip pain 05/27/2020   Degeneration of lumbar intervertebral disc 07/13/2019   Lumbar degenerative disc disease 07/13/2019   Lumbar radiculopathy 07/13/2019   Lumbar spondylosis 07/13/2019   Spinal stenosis of lumbar region 06/21/2019   Long-term current use of opiate analgesic 06/21/2019   Polyneuropathy  02/07/2019   Lumbago with sciatica 02/07/2019   Ganglion cyst of dorsum of left wrist s/p removal 09/07/18    DM (diabetes mellitus), type 2 (HCC) 08/30/2017   Respiratory bronchiolitis associated interstitial lung disease (HCC) 08/30/2017   Hyperglycemia 08/26/2017   Chronic pain 08/25/2017   Mood disorder with manic features  due to general medical condition 09/21/2016   Bipolar disorder in partial remission 09/21/2016   Synovial cyst of left knee 05/20/2016   Cough 05/20/2016   Actinic keratosis 03/29/2016   Epigastric pain 03/01/2016   Closed fracture of distal end of right radius 12/16/2015   Recurrent falls 12/11/2015   Abnormal cytological findings in specimens from other female genital organs 12/11/2015   Wheezing 11/24/2015   Localized edema 11/24/2015   History of recurrent pneumonia 11/24/2015   Alcohol abuse 08/01/2015   Anorgasmia of female 01/01/2015   Low grade squamous intraepithelial lesion (LGSIL) on Papanicolaou smear of cervix 12/04/2014   Abdominal pain, chronic, epigastric 11/20/2014   Dysphagia, pharyngoesophageal phase    Hx of colonic polyps    COPD exacerbation (HCC) 07/05/2014   GERD without esophagitis 07/05/2014   Bipolar 1 disorder (HCC) 07/05/2014   Anxiety 07/05/2014   Polysubstance abuse (HCC) 07/05/2014   Obesity (BMI 30-39.9) 07/05/2014   Acute metabolic encephalopathy 07/05/2014   History of colonic polyps 04/01/2014   Essential hypertension 03/06/2014   Mild dysplasia of cervix 11/13/2013   Benzodiazepine dependence (HCC) 08/26/2012   COPD GOLD 2/ still smoker  08/25/2012   Panic disorder 08/25/2012   Tobacco use disorder 08/25/2012   Hyperlipidemia 08/25/2012   Home Medication(s) Prior to Admission medications   Medication Sig Start Date End Date Taking? Authorizing Provider  naloxone  (NARCAN ) nasal spray 4 mg/0.1 mL Place 1 spray into the nose once as needed for up to 1 dose (overdose). 05/17/24  Yes Francesca Elsie CROME, MD  ACCU-CHEK GUIDE TEST test strip USE TO CHECK BLOOD SUGAR TWICE DAILY 04/02/24   Therisa Benton PARAS, NP  acetaminophen  (TYLENOL ) 500 MG tablet Take 1,000 mg by mouth every 6 (six) hours as needed for moderate pain, fever or headache.    [provider]  albuterol  (PROVENTIL ) (2.5 MG/3ML) 0.083% nebulizer solution INHALE 1 VIAL VIA  NEBULIZER EVERY 4 HOURS 02/14/23   Jude Harden GAILS, MD  albuterol  (VENTOLIN  HFA) 108 (90 Base) MCG/ACT inhaler Inhale 1-2 puffs into the lungs every 6 (six) hours as needed for wheezing or shortness of breath. 02/22/24   Jude Harden GAILS, MD  budesonide -glycopyrrolate -formoterol  (BREZTRI  AEROSPHERE) 160-9-4.8 MCG/ACT AERO inhaler inhale TWO puffs BY MOUTH TWICE DAILY 12/21/23   Wert, Michael B, MD  diclofenac  Sodium (VOLTAREN) 1 % GEL Apply 1 Application topically 4 (four) times daily as needed (leg pain). 12/29/22   [provider]  diltiazem  (CARDIZEM ) 30 MG tablet Take 1 tablet (30 mg total) by mouth 2 (two) times daily. 09/13/23   Strader, Laymon HERO, PA-C  famotidine  (PEPCID ) 20 MG tablet Take 20 mg by mouth daily as needed for heartburn or indigestion.    [provider]  fluorometholone (FML) 0.1 % ophthalmic suspension 1 drop See admin instructions. 01/09/23   [provider]  furosemide  (LASIX ) 40 MG tablet Take 1 tablet (40 mg total) by mouth daily. 01/18/24 04/17/24  Strader, Laymon HERO, PA-C  ipratropium-albuterol  (DUONEB) 0.5-2.5 (3) MG/3ML SOLN Take 3 mLs by nebulization every 4 (four) hours as needed (for shortness of breath/wheezing). 05/10/23   [provider]  metFORMIN  (GLUCOPHAGE ) 1000 MG tablet TAKE (1)  TABLET BY MOUTH TWICE DAILY. 07/22/23   Therisa Benton PARAS, NP  nicotine  (NICODERM CQ  - DOSED IN MG/24 HOURS) 21 mg/24hr patch Place 1 patch (21 mg total) onto the skin daily. Patient not taking: Reported on 01/18/2024 06/03/23   Sherrill Cable Latif, DO  nitroGLYCERIN  (NITROSTAT ) 0.4 MG SL tablet Place 0.4 mg under the tongue every 5 (five) minutes x 3 doses as needed for chest pain (if no relief after 2nd dose, proceed to ED or call 911).    [provider]  nystatin  (MYCOSTATIN ) 100000 UNIT/ML suspension PLACE IN THE MOUTH OR THROAT TWICE DAILY FOR SEVEN DAYS 04/06/24   Jude Harden GAILS, MD  omeprazole  (PRILOSEC) 20 MG capsule TAKE ONE CAPSULE BY  MOUTH EVERY DAY 12/05/23   Ahmed, Muhammad F, MD  predniSONE  (DELTASONE ) 10 MG tablet Take 2 tabs daily with food x 5ds, then 1 tab daily with food  - stay on this dose until seen 01/16/2024 12/19/23   Jude Harden GAILS, MD  RESTASIS  0.05 % ophthalmic emulsion Place 1 drop into both eyes 2 (two) times daily.    [provider]  rOPINIRole  (REQUIP ) 1 MG tablet Take 3 mg by mouth at bedtime. 12/09/22   [provider]  rosuvastatin  (CRESTOR ) 10 MG tablet Take 10 mg by mouth every evening. 11/10/22   [provider]  sodium chloride  (OCEAN) 0.65 % SOLN nasal spray Place 1 spray into both nostrils as needed for congestion. 06/02/23   Sherrill Cable Latif, DO  SUMAtriptan  (IMITREX ) 25 MG tablet Take 25 mg by mouth every 2 (two) hours as needed for migraine. 12/23/20   [provider]                                                                                                                                    Past Surgical History Past Surgical History:  Procedure Laterality Date   CATARACT EXTRACTION W/PHACO Left 01/14/2020   Procedure: CATARACT EXTRACTION PHACO AND INTRAOCULAR LENS PLACEMENT LEFT EYE;  Surgeon: Harrie Agent, MD;  Location: [facility];  Service: Ophthalmology;  Laterality: Left;  CDE: 8.18   CATARACT EXTRACTION W/PHACO Right 02/01/2020   Procedure: CATARACT EXTRACTION PHACO AND INTRAOCULAR LENS PLACEMENT RIGHT EYE;  Surgeon: Harrie Agent, MD;  Location: [facility];  Service: Ophthalmology;  Laterality: Right;  CDE: 9.94   CHEILECTOMY Right 01/28/2020   Procedure: KELLER BUNION IMPLANT RIGHT FOOT CHEILECTOMY RIGHT ROOT;  Surgeon: Burt Fus, DPM;  Location: [facility];  Service: Podiatry;  Laterality: Right;  BLOCK   CHOLECYSTECTOMY     COLONOSCOPY  July 2010   Dr. Mavis: 3 rectal polyps, not enough tissue for pathologic examination, recommended surveillance in 3 years   COLONOSCOPY N/A 10/23/2014   RMR: Multiple colonic polyps removed as described above. No  endoscopic explaniation for abdominal pain. however. next tcs 10/2019   ESOPHAGOGASTRODUODENOSCOPY  July 2010   Dr. Mavis: gastritis and duodenitis, H.pylori  negative   ESOPHAGOGASTRODUODENOSCOPY N/A 10/23/2014   RMR: Normal EGD. Status post passage of a Maloney dilator. Today's finding s would not explain abdominal pain   EYE SURGERY Left 01/14/2020   cataract removal   GANGLION CYST EXCISION Left 09/07/2018   Procedure: REMOVAL GANGLION OF WRIST;  Surgeon: Margrette Taft BRAVO, MD;  Location: [facility];  Service: Orthopedics;  Laterality: Left;   HERNIA REPAIR     Dr. Mavis   KIDNEY STONE SURGERY     MALONEY DILATION N/A 10/23/2014   Procedure: AGAPITO DILATION;  Surgeon: Lamar CHRISTELLA Hollingshead, MD;  Location: [facility];  Service: Endoscopy;  Laterality: N/A;   OPEN REDUCTION INTERNAL FIXATION (ORIF) DISTAL RADIAL FRACTURE Right 12/09/2015   Procedure: OPEN REDUCTION INTERNAL FIXATION (ORIF) RIGHT DISTAL RADIUS;  Surgeon: Franky Curia, MD;  Location: [facility];  Service: Orthopedics;  Laterality: Right;   Family History Family History  Adopted: Yes    Social History Social History   Tobacco Use   Smoking status: Every Day    Current packs/day: 2.00    Average packs/day: 2.0 packs/day for 56.9 years (113.7 ttl pk-yrs)    Types: Cigarettes    Start date: 1969    Passive exposure: Current   Smokeless tobacco: Never   Tobacco comments:    Smoking 1/2 a pack in 24 hours.  05/31/2022 hfb  Vaping Use   Vaping status: Never Used  Substance Use Topics   Alcohol use: Not Currently    Alcohol/week: 1.0 standard drink of alcohol    Types: 1 Standard drinks or equivalent per week    Comment: alcohol abuse   Drug use: Not Currently    Types: Cocaine    Comment: clean since 2023   Allergies Septra [sulfamethoxazole-trimethoprim] and Penicillins  Review of Systems Review of Systems  All other systems reviewed and are negative.   Physical Exam Vital Signs  I have reviewed  the triage vital signs BP 112/79 (BP Location: Right Arm)   Pulse 92   Temp 97.8 F (36.6 C) (Temporal)   Resp 20   Ht 5' 4 (1.626 m)   Wt 76.2 kg   SpO2 96%   BMI 28.84 kg/m  Physical Exam Vitals and nursing note reviewed.  Constitutional:      General: She is not in acute distress.    Appearance: She is well-developed.  HENT:     Head: Normocephalic and atraumatic.     Mouth/Throat:     Mouth: Mucous membranes are moist.  Eyes:     Pupils: Pupils are equal, round, and reactive to light.  Cardiovascular:     Rate and Rhythm: Normal rate and regular rhythm.     Heart sounds: No murmur heard. Pulmonary:     Effort: Pulmonary effort is normal. No respiratory distress.     Breath sounds: Wheezing present.  Abdominal:     General: Abdomen is flat.     Palpations: Abdomen is soft.     Tenderness: There is no abdominal tenderness.  Musculoskeletal:        General: No tenderness.     Right lower leg: No edema.     Left lower leg: No edema.  Skin:    General: Skin is warm and dry.  Neurological:     General: No focal deficit present.     Mental Status: She is alert. Mental status is at baseline.  Psychiatric:        Mood and Affect: Mood normal.  Behavior: Behavior normal.     ED Results and Treatments Labs (all labs ordered are listed, but only abnormal results are displayed) Labs Reviewed  URINE DRUG SCREEN - Abnormal; Notable for the following components:      Result Value   Cocaine POSITIVE (*)    All other components within normal limits  CBC WITH DIFFERENTIAL/PLATELET - Abnormal; Notable for the following components:   RBC 3.30 (*)    Hemoglobin 10.0 (*)    HCT 31.8 (*)    All other components within normal limits  BASIC METABOLIC PANEL WITH GFR - Abnormal; Notable for the following components:   Chloride 97 (*)    CO2 33 (*)    Glucose, Bld 100 (*)    All other components within normal limits  ETHANOL  CBC WITH DIFFERENTIAL/PLATELET                                                                                                                           Radiology No results found.  Pertinent labs & imaging results that were available during my care of the patient were reviewed by me and considered in my medical decision making (see MDM for details).  Medications Ordered in ED Medications  sodium chloride  0.9 % bolus 1,000 mL (0 mLs Intravenous Stopped 05/17/24 2349)                                                                                                                                     Procedures Procedures  (including critical care time)  Medical Decision Making / ED Course   MDM:  67 year old presenting to the emergency department with suspected overdose.  Currently, patient is well-appearing, physical examination with some wheezing however she reports she feels at her baseline and does not want a breathing treatment.  No respiratory distress.  Suspect unintentional opioid overdose.  Patient received Narcan  so we will monitor.  She also seems slightly intoxicated from alcohol so we will check basic labs and ethanol level.  Following observational period if patient requires no further doses and is able to ambulate likely discharge to home.  Counseled on avoiding ingestion of substance  Clinical Course as of 05/18/24 1325  Thu May 17, 2024  2337 Signed out to Dr. Raford pending observation given OD and narcan  reversal. [WS]    Clinical Course User Index [WS] Francesca Elsie CROME, MD  Additional history obtained: -Additional history obtained from ems -External records from outside source obtained and reviewed including: Chart review including previous notes, labs, imaging, consultation notes including prior notes    Lab Tests: -I ordered, reviewed, and interpreted labs.   The pertinent results include:   Labs Reviewed  URINE DRUG SCREEN - Abnormal; Notable for the following components:      Result  Value   Cocaine POSITIVE (*)    All other components within normal limits  CBC WITH DIFFERENTIAL/PLATELET - Abnormal; Notable for the following components:   RBC 3.30 (*)    Hemoglobin 10.0 (*)    HCT 31.8 (*)    All other components within normal limits  BASIC METABOLIC PANEL WITH GFR - Abnormal; Notable for the following components:   Chloride 97 (*)    CO2 33 (*)    Glucose, Bld 100 (*)    All other components within normal limits  ETHANOL  CBC WITH DIFFERENTIAL/PLATELET    Notable for +UDS  Medicines ordered and prescription drug management: Meds ordered this encounter  Medications   sodium chloride  0.9 % bolus 1,000 mL   naloxone  (NARCAN ) nasal spray 4 mg/0.1 mL    Sig: Place 1 spray into the nose once as needed for up to 1 dose (overdose).    Dispense:  1 each    Refill:  0    -I have reviewed the patients home medicines and have made adjustments as needed  Cardiac Monitoring: The patient was maintained on a cardiac monitor.  I personally viewed and interpreted the cardiac monitored which showed an underlying rhythm of: NSR  Social Determinants of Health:  Diagnosis or treatment significantly limited by social determinants of health: polysubstance abuse   Reevaluation: After the interventions noted above, I reevaluated the patient and found that their symptoms have improved  Co morbidities that complicate the patient evaluation  Past Medical History:  Diagnosis Date   Anemia    Anxiety    Arthritis    Bipolar 1 disorder (HCC)    CHF (congestive heart failure) (HCC)    COPD (chronic obstructive pulmonary disease) (HCC)    Depression    Diabetes mellitus without complication (HCC)    Dyspnea    Emphysema of lung (HCC)    GERD (gastroesophageal reflux disease)    Headache    migraines   History of hiatal hernia    History of kidney stones    Hyperlipidemia    Hypertension    Neuromuscular disorder (HCC)    neuropathy   On home oxygen  therapy     Pneumonia 2015, 2019   Tracheomalacia    Vaginal Pap smear, abnormal       Dispostion: Disposition decision including need for hospitalization was considered, and patient disposition pending at time of sign out.    Final Clinical Impression(s) / ED Diagnoses Final diagnoses:  Opiate overdose, undetermined intent, initial encounter (HCC)  Normochromic normocytic anemia     This chart was dictated using voice recognition software.  Despite best efforts to proofread,  errors can occur which can change the documentation meaning.    Francesca Elsie CROME, MD 05/18/24 [phone]

## 2024-05-17 NOTE — ED Triage Notes (Signed)
 [facility] from home. Cc of Overdose. Ems called out by her husband for patient keep becoming unresponsive and falling.  When EMS arrived patient was unresponsive, 6 RR, and pinpoint pupils. 2mg  Narcan  via 20g L AC. And patient is now alert and oriented Admits to drinking and smoking crack tonight.  Husband doing the same.

## 2024-05-18 NOTE — ED Notes (Signed)
 Pt up to the bathroom

## 2024-05-23 ENCOUNTER — Ambulatory Visit: Admitting: Nurse Practitioner

## 2024-05-23 DIAGNOSIS — I10 Essential (primary) hypertension: Secondary | ICD-10-CM

## 2024-05-23 DIAGNOSIS — E119 Type 2 diabetes mellitus without complications: Secondary | ICD-10-CM

## 2024-05-23 DIAGNOSIS — Z7984 Long term (current) use of oral hypoglycemic drugs: Secondary | ICD-10-CM

## 2024-05-25 ENCOUNTER — Ambulatory Visit: Admitting: Podiatry

## 2024-05-25 ENCOUNTER — Encounter: Payer: Self-pay | Admitting: Student

## 2024-05-25 ENCOUNTER — Ambulatory Visit: Attending: Student | Admitting: Student

## 2024-05-25 VITALS — BP 106/68 | HR 98 | Ht 64.0 in | Wt 140.0 lb

## 2024-05-25 DIAGNOSIS — I2510 Atherosclerotic heart disease of native coronary artery without angina pectoris: Secondary | ICD-10-CM

## 2024-05-25 DIAGNOSIS — I5020 Unspecified systolic (congestive) heart failure: Secondary | ICD-10-CM | POA: Diagnosis not present

## 2024-05-25 DIAGNOSIS — I471 Supraventricular tachycardia, unspecified: Secondary | ICD-10-CM

## 2024-05-25 DIAGNOSIS — I10 Essential (primary) hypertension: Secondary | ICD-10-CM | POA: Diagnosis not present

## 2024-05-25 DIAGNOSIS — J449 Chronic obstructive pulmonary disease, unspecified: Secondary | ICD-10-CM

## 2024-05-25 NOTE — Patient Instructions (Signed)
 Medication Instructions:  Your physician recommends that you continue on your current medications as directed. Please refer to the Current Medication list given to you today.   Labwork: None today  Testing/Procedures: None today  Follow-Up: 6 months dr.Branch or Brittany Strader,PA-C  Any Other Special Instructions Will Be Listed Below (If Applicable).  If you need a refill on your cardiac medications before your next appointment, please call your pharmacy.

## 2024-05-25 NOTE — Progress Notes (Unsigned)
 Cardiology Office Note    Date:  05/25/2024  ID:  Rebekah, Peterson [birth year], MRN [id number] Cardiologist: Alvan Carrier, MD Cardiology APP:  Johnson Laymon HERO, PA-C { :  History of Present Illness:    [name] is a 68 y.o. female with past medical history of coronary calcifications (by prior CT imaging with low-risk NST in 2015, NSTEMI in 01/2022 and medical management pursued given her multiple medical issues and substance abuse, catheterization readdressed in 2023 but she canceled and no showed for the procedure), chronic HFimpEF (EF 40-45% by echo in 01/2022, improved to 55-60% by echo in 04/2022), HTN, HLD, Type II DM, suspicious lung nodule (prior PET suspicious for adenocarcinoma and underwent empiric radiation therapy), cocaine use and COPD who presents to the office today for follow-up.    She was last examined by myself in 01/2024 and reported having worsening lower extremity edema over the past several weeks and had been on multiple steroid tapers due to worsening COPD. She was also consuming 60 to 80 ounces of Coca-Cola along with water  and tea. She had significant volume overload by examination and was encouraged to go to the ED for admission and IV diuresis but she declined. Says that she previously urinated much more frequently with Lasix  as compared to Torsemide , therefore Torsemide  was discontinued and switched to Lasix  40 mg daily with plans for a BNP and BMET in 1 week. If no improvement, would then review ED evaluation again. She never had follow-up labs.   She no-showed for a follow-up appointment in 03/2024.  She did present to the ED on 05/17/2024 after being found unresponsive by her husband.  She received Narcan  with improvement.  She did report drinking alcohol and smoking crack.  She was monitored for several hours and was alert and oriented and oxygen  saturations were appropriate on her baseline supplemental oxygen .  Labs at that time showed  WBC 8.1, Hgb 10.0, platelets 275, Na+ 136, K+ 3.7 and creatinine 0.50.  Ethanol was less than 15 but UDS was positive for cocaine.  Was discharged home given stable vitals.  In talk with the patient and her husband today, she reports overall doing well since recent ED evaluation.  She does have baseline dyspnea on exertion with no acute changes in this.  No recent chest pain, orthopnea or PND.  Reports her lower extremity edema has significant improved since her last office visit.  She does report occasional palpitations but these typically occur when she feels anxious.  Symptoms spontaneously resolve after focusing on her breathing or praying.  She denies any recurrent substance use.  Studies Reviewed:   EKG: EKG is not ordered today.  Echocardiogram: 09/2022 IMPRESSIONS     1. Left ventricular ejection fraction, by estimation, is 65 to 70%. The  left ventricle has normal function. The left ventricle has no regional  wall motion abnormalities. Left ventricular diastolic parameters were  normal.   2. Right ventricular systolic function is normal. The right ventricular  size is normal. Tricuspid regurgitation signal is inadequate for assessing  PA pressure.   3. The mitral valve is grossly normal. Trivial mitral valve  regurgitation.   4. The aortic valve is tricuspid. Aortic valve regurgitation is not  visualized.   5. The inferior vena cava is dilated in size with >50% respiratory  variability, suggesting right atrial pressure of 8 mmHg.   Event Monitor: 10/2023   7 day monitor   Rare supraventricular ectopy in the form of isolated  PACs, couplets, triplets. 26 runs of SVT longest 43 seconds.   Rare ventricular ectopy in the form of isolated PVCs, couplets   No symptoms reported     Patch Wear Time:  6 days and 22 hours (2025-03-20T10:54:31-0400 to 2025-03-27T09:13:31-0400)   Patient had a min HR of 68 bpm, max HR of 200 bpm, and avg HR of 91 bpm. Predominant underlying rhythm was  Sinus Rhythm. 26 Supraventricular Tachycardia runs occurred, the run with the fastest interval lasting 4 beats with a max rate of 200 bpm, the  longest lasting 43.3 secs with an avg rate of 128 bpm. Isolated SVEs were rare (<1.0%), SVE Couplets were rare (<1.0%), and SVE Triplets were rare (<1.0%). Isolated VEs were rare (<1.0%), VE Couplets were rare (<1.0%), and no VE Triplets were present.    Physical Exam:   VS:  BP 106/68 (BP Location: Left Arm)   Pulse 98   Ht 5' 4 (1.626 m)   Wt 140 lb (63.5 kg)   SpO2 98%   BMI 24.03 kg/m    Wt Readings from Last 3 Encounters:  05/25/24 140 lb (63.5 kg)  05/17/24 167 lb 15.9 oz (76.2 kg)  01/18/24 168 lb (76.2 kg)     GEN: Pleasant female appearing in no acute distress. Sitting in wheelchair.  NECK: No JVD; No carotid bruits CARDIAC: RRR, no murmurs, rubs, gallops RESPIRATORY: Rhonchi throughout. No wheezing or rales.  ABDOMEN: Appears non-distended. No obvious abdominal masses. EXTREMITIES: No clubbing or cyanosis. No pitting edema.  Distal pedal pulses are 2+ bilaterally.   Assessment and Plan:   1. Heart failure with improved ejection fraction (HFimpEF) (HCC) - Her EF was previously 40 to 45% in 2023 and had normalized by repeat imaging. Was at 65 to 70% by most recent echocardiogram in 09/2022. - She did have evidence of volume overload the time of her last office visit but this has improved and her weight has declined by 28 pounds since then. She is now only taking Lasix  40 mg as needed and we reviewed the importance of following daily weights that she should take this for weight gain greater than 2 pounds overnight or 5 pounds in 1 week.   2. Coronary artery disease involving native coronary artery of native heart without angina pectoris - She did have an NSTEMI in 2023 and this was medically managed at that time given her multiple medical issues and substance use. Cardiac catheterization was later reviewed as an outpatient but she  declined. - Her activity is overall limited that she denies any recent anginal symptoms.  Continue with risk factor modification.  She is on Crestor  10 mg daily.   3. SVT (supraventricular tachycardia) (HCC) -She reports occasional palpitations when she feels stressed or anxious but no persistent symptoms.  Will continue short acting Cardizem  30 mg twice daily for rate control.  Would not further titrate given her BP at 106/68 during today's visit.   4. Essential hypertension - BP is well-controlled at 106/68 during today's visit.  Continue current medical therapy with short acting Cardizem  30 mg twice daily.   5. Chronic obstructive pulmonary disease, unspecified COPD type (HCC) - Followed by Pulmonology and she is on 4 to 5 L nasal cannula. Has a history of lung cancer and underwent empiric radiation as she was felt to be too high-risk for surgery.   Signed, Laymon CHRISTELLA Qua, PA-C

## 2024-05-27 ENCOUNTER — Encounter: Payer: Self-pay | Admitting: Student

## 2024-05-29 ENCOUNTER — Telehealth: Payer: Self-pay | Admitting: Cardiology

## 2024-05-29 NOTE — Telephone Encounter (Signed)
 Pt c/o Shortness Of Breath: STAT if SOB developed within the last 24 hours or pt is noticeably SOB on the phone  1. Are you currently SOB (can you hear that pt is SOB on the phone)?   Wheezing  2. How long have you been experiencing SOB?   A couple of days  3. Are you SOB when sitting or when up moving around?  Husband stated the wheezing is off on on  4. Are you currently experiencing any other symptoms?  Headache  Husband Chris) stated patient has been having wheezing and wants to get antibiotics as patient is susceptible to getting pneumonia.

## 2024-05-29 NOTE — Telephone Encounter (Signed)
 Spoke with pt's spouse. Encouraged to be seen by PCP or urgent care for evaluation and treatment.

## 2024-05-30 ENCOUNTER — Ambulatory Visit: Admitting: Podiatry

## 2024-06-09 ENCOUNTER — Other Ambulatory Visit: Payer: Self-pay | Admitting: Internal Medicine

## 2024-06-12 ENCOUNTER — Telehealth: Payer: Self-pay | Admitting: *Deleted

## 2024-06-12 NOTE — Telephone Encounter (Signed)
 CALLED PATIENT TO INFORM OF CT FOR 07-09-24- ARRIVAL TIME- 7:45 AM @ [facility],NO RESTRICTIONS TO SCAN, SPOKE WITH PATIENT AND SHE IS AWARE OF THIS SCAN AND THE INSTRUCTIONS

## 2024-06-13 ENCOUNTER — Other Ambulatory Visit: Payer: Self-pay | Admitting: Internal Medicine

## 2024-06-13 NOTE — Telephone Encounter (Signed)
 Copied from CRM #[id number]. Topic: Clinical - Medication Refill >> Jun 13, 2024 11:11 AM Russell PARAS wrote: Medication:   budesonide -glycopyrrolate -formoterol  (BREZTRI  AEROSPHERE) 160-9-4.8 MCG/ACT AERO inhaler   Has the patient contacted their pharmacy? Yes (Agent: If no, request that the patient contact the pharmacy for the refill. If patient does not wish to contact the pharmacy document the reason why and proceed with request.) (Agent: If yes, when and what did the pharmacy advise?)  This is the patient's preferred pharmacy:  [facility] - [place], [place] - [address] [place] [address] Phone: [phone] Fax: [phone]  Is this the correct pharmacy for this prescription? Yes If no, delete pharmacy and type the correct one.   Has the prescription been filled recently? Yes  Is the patient out of the medication? No  Has the patient been seen for an appointment in the last year OR does the patient have an upcoming appointment? No  Can we respond through MyChart? Yes  Agent: Please be advised that Rx refills may take up to 3 business days. We ask that you follow-up with your pharmacy.

## 2024-06-16 ENCOUNTER — Other Ambulatory Visit: Payer: Self-pay | Admitting: Pulmonary Disease

## 2024-06-19 ENCOUNTER — Other Ambulatory Visit: Payer: Self-pay | Admitting: Internal Medicine

## 2024-06-19 ENCOUNTER — Other Ambulatory Visit (HOSPITAL_BASED_OUTPATIENT_CLINIC_OR_DEPARTMENT_OTHER): Payer: Self-pay | Admitting: Pulmonary Disease

## 2024-07-09 ENCOUNTER — Ambulatory Visit (HOSPITAL_COMMUNITY)

## 2024-07-20 ENCOUNTER — Other Ambulatory Visit: Payer: Self-pay | Admitting: Nurse Practitioner
